# Patient Record
Sex: Male | Born: 1955
Health system: Southern US, Community
[De-identification: ages and names within clinical notes are randomized; demographics above are authoritative.]

## PROBLEM LIST (undated history)

## (undated) DIAGNOSIS — G459 Transient cerebral ischemic attack, unspecified: Secondary | ICD-10-CM

## (undated) DIAGNOSIS — I2510 Atherosclerotic heart disease of native coronary artery without angina pectoris: Secondary | ICD-10-CM

## (undated) DIAGNOSIS — F0390 Unspecified dementia without behavioral disturbance: Secondary | ICD-10-CM

## (undated) DIAGNOSIS — E785 Hyperlipidemia, unspecified: Secondary | ICD-10-CM

## (undated) DIAGNOSIS — C259 Malignant neoplasm of pancreas, unspecified: Secondary | ICD-10-CM

## (undated) DIAGNOSIS — K859 Acute pancreatitis without necrosis or infection, unspecified: Secondary | ICD-10-CM

## (undated) DIAGNOSIS — M51369 Other intervertebral disc degeneration, lumbar region without mention of lumbar back pain or lower extremity pain: Secondary | ICD-10-CM

## (undated) DIAGNOSIS — K219 Gastro-esophageal reflux disease without esophagitis: Secondary | ICD-10-CM

## (undated) DIAGNOSIS — M5136 Other intervertebral disc degeneration, lumbar region: Secondary | ICD-10-CM

## (undated) DIAGNOSIS — N189 Chronic kidney disease, unspecified: Secondary | ICD-10-CM

## (undated) DIAGNOSIS — B029 Zoster without complications: Secondary | ICD-10-CM

## (undated) DIAGNOSIS — M549 Dorsalgia, unspecified: Secondary | ICD-10-CM

## (undated) DIAGNOSIS — I509 Heart failure, unspecified: Secondary | ICD-10-CM

## (undated) DIAGNOSIS — I219 Acute myocardial infarction, unspecified: Secondary | ICD-10-CM

## (undated) DIAGNOSIS — I639 Cerebral infarction, unspecified: Secondary | ICD-10-CM

## (undated) DIAGNOSIS — K469 Unspecified abdominal hernia without obstruction or gangrene: Secondary | ICD-10-CM

## (undated) DIAGNOSIS — E119 Type 2 diabetes mellitus without complications: Secondary | ICD-10-CM

## (undated) DIAGNOSIS — R413 Other amnesia: Secondary | ICD-10-CM

## (undated) DIAGNOSIS — R569 Unspecified convulsions: Secondary | ICD-10-CM

## (undated) DIAGNOSIS — M1990 Unspecified osteoarthritis, unspecified site: Secondary | ICD-10-CM

## (undated) DIAGNOSIS — I10 Essential (primary) hypertension: Secondary | ICD-10-CM

## (undated) HISTORY — DX: Gastro-esophageal reflux disease without esophagitis: K21.9

## (undated) HISTORY — PX: CARDIAC CATHETERIZATION: SHX172

## (undated) HISTORY — PX: VASECTOMY: SHX75

## (undated) HISTORY — DX: Heart failure, unspecified: I50.9

## (undated) HISTORY — PX: TOTAL ELBOW REPLACEMENT: SUR1214

## (undated) HISTORY — DX: Zoster without complications: B02.9

## (undated) HISTORY — DX: Acute pancreatitis without necrosis or infection, unspecified: K85.90

## (undated) HISTORY — PX: CORONARY ANGIOPLASTY: SHX604

## (undated) HISTORY — PX: OTHER SURGICAL HISTORY: SHX169

## (undated) HISTORY — PX: VENTRICULOPERITONEAL SHUNT: SHX204

## (undated) HISTORY — DX: Hyperlipidemia, unspecified: E78.5

## (undated) HISTORY — DX: Atherosclerotic heart disease of native coronary artery without angina pectoris: I25.10

## (undated) HISTORY — DX: Dorsalgia, unspecified: M54.9

## (undated) HISTORY — PX: EXTERNAL FIXATION WRIST FRACTURE: SHX1553

---

## 2003-09-08 ENCOUNTER — Emergency Department (HOSPITAL_COMMUNITY): Admission: EM | Admit: 2003-09-08 | Discharge: 2003-09-08 | Payer: Self-pay | Admitting: Emergency Medicine

## 2004-07-06 ENCOUNTER — Encounter: Payer: Self-pay | Admitting: Neurosurgery

## 2004-07-23 ENCOUNTER — Encounter: Payer: Self-pay | Admitting: Neurosurgery

## 2004-08-23 ENCOUNTER — Encounter: Payer: Self-pay | Admitting: Neurosurgery

## 2004-09-23 ENCOUNTER — Encounter: Payer: Self-pay | Admitting: Neurosurgery

## 2004-10-21 ENCOUNTER — Encounter: Payer: Self-pay | Admitting: Neurosurgery

## 2004-11-21 ENCOUNTER — Encounter: Payer: Self-pay | Admitting: Neurosurgery

## 2006-01-04 ENCOUNTER — Emergency Department: Payer: Self-pay | Admitting: Emergency Medicine

## 2007-04-20 ENCOUNTER — Emergency Department: Payer: Self-pay | Admitting: Emergency Medicine

## 2008-02-12 ENCOUNTER — Ambulatory Visit: Payer: Self-pay | Admitting: Chiropractor

## 2008-07-14 ENCOUNTER — Emergency Department: Payer: Self-pay | Admitting: Emergency Medicine

## 2008-08-20 ENCOUNTER — Ambulatory Visit: Payer: Self-pay | Admitting: Specialist

## 2010-12-06 ENCOUNTER — Inpatient Hospital Stay: Payer: Self-pay | Admitting: Internal Medicine

## 2010-12-15 ENCOUNTER — Ambulatory Visit: Payer: Self-pay | Admitting: Pain Medicine

## 2010-12-19 ENCOUNTER — Observation Stay: Payer: Self-pay | Admitting: Internal Medicine

## 2011-01-05 ENCOUNTER — Encounter: Payer: Self-pay | Admitting: Cardiovascular Disease

## 2011-01-18 ENCOUNTER — Ambulatory Visit: Payer: Self-pay | Admitting: Internal Medicine

## 2011-01-22 ENCOUNTER — Encounter: Payer: Self-pay | Admitting: Cardiovascular Disease

## 2011-02-21 ENCOUNTER — Encounter: Payer: Self-pay | Admitting: Cardiovascular Disease

## 2011-04-18 ENCOUNTER — Emergency Department: Payer: Self-pay | Admitting: Emergency Medicine

## 2011-07-31 ENCOUNTER — Inpatient Hospital Stay: Payer: Self-pay | Admitting: Internal Medicine

## 2011-08-24 HISTORY — PX: OTHER SURGICAL HISTORY: SHX169

## 2011-09-01 ENCOUNTER — Encounter: Payer: Self-pay | Admitting: Cardiology

## 2011-09-06 ENCOUNTER — Observation Stay: Payer: Self-pay | Admitting: Internal Medicine

## 2011-09-06 LAB — URINALYSIS, COMPLETE
Bacteria: NONE SEEN
Bilirubin,UR: NEGATIVE
Blood: NEGATIVE
Glucose,UR: NEGATIVE mg/dL (ref 0–75)
Ketone: NEGATIVE
Leukocyte Esterase: NEGATIVE
Nitrite: NEGATIVE
Ph: 5 (ref 4.5–8.0)
Protein: NEGATIVE
RBC,UR: <1 /HPF (ref 0–5)
Specific Gravity: 1.016 (ref 1.003–1.030)
Squamous Epithelial: NONE SEEN
WBC UR: <1 /HPF (ref 0–5)

## 2011-09-06 LAB — CBC
HCT: 46 % (ref 40.0–52.0)
HGB: 15.3 g/dL (ref 13.0–18.0)
MCH: 31.1 pg (ref 26.0–34.0)
MCHC: 33.3 g/dL (ref 32.0–36.0)
MCV: 93 fL (ref 80–100)
Platelet: 225 10*3/uL (ref 150–440)
RBC: 4.93 10*6/uL (ref 4.40–5.90)
RDW: 13.4 % (ref 11.5–14.5)
WBC: 9.4 10*3/uL (ref 3.8–10.6)

## 2011-09-06 LAB — COMPREHENSIVE METABOLIC PANEL
Albumin: 4.1 g/dL (ref 3.4–5.0)
Alkaline Phosphatase: 143 U/L — ABNORMAL HIGH (ref 50–136)
Anion Gap: 10 (ref 7–16)
BUN: 14 mg/dL (ref 7–18)
Bilirubin,Total: 0.4 mg/dL (ref 0.2–1.0)
Calcium, Total: 9.3 mg/dL (ref 8.5–10.1)
Chloride: 104 mmol/L (ref 98–107)
Co2: 25 mmol/L (ref 21–32)
Creatinine: 0.86 mg/dL (ref 0.60–1.30)
EGFR (African American): >60
EGFR (Non-African Amer.): >60
Glucose: 128 mg/dL — ABNORMAL HIGH (ref 65–99)
Osmolality: 280 (ref 275–301)
Potassium: 3.4 mmol/L — ABNORMAL LOW (ref 3.5–5.1)
SGOT(AST): 27 U/L (ref 15–37)
SGPT (ALT): 42 U/L
Sodium: 139 mmol/L (ref 136–145)
Total Protein: 7.9 g/dL (ref 6.4–8.2)

## 2011-09-06 LAB — LIPASE, BLOOD: Lipase: 112 U/L (ref 73–393)

## 2011-09-06 LAB — TROPONIN I
Troponin-I: <0.02 ng/mL
Troponin-I: <0.02 ng/mL

## 2011-09-06 LAB — AMYLASE: Amylase: 30 U/L (ref 25–115)

## 2011-09-07 LAB — CBC WITH DIFFERENTIAL/PLATELET
Basophil #: 0 10*3/uL (ref 0.0–0.1)
Basophil %: 0.3 %
Eosinophil #: 0.1 10*3/uL (ref 0.0–0.7)
Eosinophil %: 0.7 %
HCT: 43 % (ref 40.0–52.0)
HGB: 14.3 g/dL (ref 13.0–18.0)
Lymphocyte #: 2.8 10*3/uL (ref 1.0–3.6)
Lymphocyte %: 31.4 %
MCH: 31 pg (ref 26.0–34.0)
MCHC: 33.1 g/dL (ref 32.0–36.0)
MCV: 94 fL (ref 80–100)
Monocyte #: 0.9 10*3/uL — ABNORMAL HIGH (ref 0.0–0.7)
Monocyte %: 10 %
Neutrophil #: 5.1 10*3/uL (ref 1.4–6.5)
Neutrophil %: 57.6 %
Platelet: 213 10*3/uL (ref 150–440)
RBC: 4.59 10*6/uL (ref 4.40–5.90)
RDW: 13.7 % (ref 11.5–14.5)
WBC: 8.8 10*3/uL (ref 3.8–10.6)

## 2011-09-07 LAB — BASIC METABOLIC PANEL
Anion Gap: 10 (ref 7–16)
BUN: 9 mg/dL (ref 7–18)
Calcium, Total: 8.6 mg/dL (ref 8.5–10.1)
Chloride: 105 mmol/L (ref 98–107)
Co2: 26 mmol/L (ref 21–32)
Creatinine: 0.67 mg/dL (ref 0.60–1.30)
EGFR (African American): >60
EGFR (Non-African Amer.): >60
Glucose: 162 mg/dL — ABNORMAL HIGH (ref 65–99)
Osmolality: 283 (ref 275–301)
Potassium: 4 mmol/L (ref 3.5–5.1)
Sodium: 141 mmol/L (ref 136–145)

## 2011-09-07 LAB — TROPONIN I
Troponin-I: <0.02 ng/mL
Troponin-I: <0.02 ng/mL

## 2011-09-24 ENCOUNTER — Encounter: Payer: Self-pay | Admitting: Cardiology

## 2011-10-22 ENCOUNTER — Encounter: Payer: Self-pay | Admitting: Cardiology

## 2011-11-22 ENCOUNTER — Encounter: Payer: Self-pay | Admitting: Cardiology

## 2011-12-11 ENCOUNTER — Ambulatory Visit: Payer: Self-pay | Admitting: Internal Medicine

## 2011-12-11 LAB — CBC WITH DIFFERENTIAL/PLATELET
Basophil #: 0 10*3/uL (ref 0.0–0.1)
Basophil %: 0.5 %
Eosinophil #: 0.1 10*3/uL (ref 0.0–0.7)
Eosinophil %: 1.2 %
HCT: 44.5 % (ref 40.0–52.0)
HGB: 15.1 g/dL (ref 13.0–18.0)
Lymphocyte #: 2.6 10*3/uL (ref 1.0–3.6)
Lymphocyte %: 36.7 %
MCH: 31.7 pg (ref 26.0–34.0)
MCHC: 33.9 g/dL (ref 32.0–36.0)
MCV: 94 fL (ref 80–100)
Monocyte #: 0.6 x10 3/mm (ref 0.2–1.0)
Monocyte %: 8.9 %
Neutrophil #: 3.7 10*3/uL (ref 1.4–6.5)
Neutrophil %: 52.7 %
Platelet: 211 10*3/uL (ref 150–440)
RBC: 4.75 10*6/uL (ref 4.40–5.90)
RDW: 13.5 % (ref 11.5–14.5)
WBC: 7.1 10*3/uL (ref 3.8–10.6)

## 2011-12-11 LAB — COMPREHENSIVE METABOLIC PANEL
Albumin: 3.5 g/dL (ref 3.4–5.0)
Alkaline Phosphatase: 225 U/L — ABNORMAL HIGH (ref 50–136)
Anion Gap: 10 (ref 7–16)
BUN: 9 mg/dL (ref 7–18)
Bilirubin,Total: 0.2 mg/dL (ref 0.2–1.0)
Calcium, Total: 8.8 mg/dL (ref 8.5–10.1)
Chloride: 100 mmol/L (ref 98–107)
Co2: 23 mmol/L (ref 21–32)
Creatinine: 0.9 mg/dL (ref 0.60–1.30)
EGFR (African American): >60
EGFR (Non-African Amer.): >60
Glucose: 328 mg/dL — ABNORMAL HIGH (ref 65–99)
Osmolality: 278 (ref 275–301)
Potassium: 3.7 mmol/L (ref 3.5–5.1)
SGOT(AST): 29 U/L (ref 15–37)
SGPT (ALT): 49 U/L
Sodium: 133 mmol/L — ABNORMAL LOW (ref 136–145)
Total Protein: 7.8 g/dL (ref 6.4–8.2)

## 2011-12-11 LAB — SEDIMENTATION RATE: Erythrocyte Sed Rate: 18 mm/hr (ref 0–20)

## 2012-01-06 ENCOUNTER — Emergency Department: Payer: Self-pay | Admitting: Unknown Physician Specialty

## 2012-01-06 LAB — MAGNESIUM: Magnesium: 1.6 mg/dL — ABNORMAL LOW

## 2012-01-06 LAB — COMPREHENSIVE METABOLIC PANEL
Albumin: 3.7 g/dL (ref 3.4–5.0)
Alkaline Phosphatase: 179 U/L — ABNORMAL HIGH (ref 50–136)
Anion Gap: 10 (ref 7–16)
BUN: 13 mg/dL (ref 7–18)
Bilirubin,Total: 0.2 mg/dL (ref 0.2–1.0)
Calcium, Total: 8.4 mg/dL — ABNORMAL LOW (ref 8.5–10.1)
Chloride: 103 mmol/L (ref 98–107)
Co2: 23 mmol/L (ref 21–32)
Creatinine: 1.09 mg/dL (ref 0.60–1.30)
EGFR (African American): >60
EGFR (Non-African Amer.): >60
Glucose: 343 mg/dL — ABNORMAL HIGH (ref 65–99)
Osmolality: 286 (ref 275–301)
Potassium: 4.1 mmol/L (ref 3.5–5.1)
SGOT(AST): 36 U/L (ref 15–37)
SGPT (ALT): 48 U/L
Sodium: 136 mmol/L (ref 136–145)
Total Protein: 7.2 g/dL (ref 6.4–8.2)

## 2012-01-06 LAB — APTT: Activated PTT: 24.7 secs (ref 23.6–35.9)

## 2012-01-06 LAB — CBC
HCT: 46 % (ref 40.0–52.0)
HGB: 15.1 g/dL (ref 13.0–18.0)
MCH: 30.7 pg (ref 26.0–34.0)
MCHC: 32.8 g/dL (ref 32.0–36.0)
MCV: 94 fL (ref 80–100)
Platelet: 221 10*3/uL (ref 150–440)
RBC: 4.9 10*6/uL (ref 4.40–5.90)
RDW: 13.6 % (ref 11.5–14.5)
WBC: 7.7 10*3/uL (ref 3.8–10.6)

## 2012-01-06 LAB — CK TOTAL AND CKMB (NOT AT ARMC)
CK, Total: 397 U/L — ABNORMAL HIGH (ref 35–232)
CK-MB: 5 ng/mL — ABNORMAL HIGH (ref 0.5–3.6)

## 2012-01-06 LAB — TROPONIN I
Troponin-I: 0.11 ng/mL — ABNORMAL HIGH
Troponin-I: 1.6 ng/mL — ABNORMAL HIGH

## 2012-01-06 LAB — PROTIME-INR
INR: 0.9
Prothrombin Time: 13 secs (ref 11.5–14.7)

## 2014-09-17 LAB — CBC
HCT: 53.1 % — AB (ref 40.0–52.0)
HGB: 18 g/dL (ref 13.0–18.0)
MCH: 31.7 pg (ref 26.0–34.0)
MCHC: 33.9 g/dL (ref 32.0–36.0)
MCV: 94 fL (ref 80–100)
Platelet: 224 10*3/uL (ref 150–440)
RBC: 5.68 10*6/uL (ref 4.40–5.90)
RDW: 14 % (ref 11.5–14.5)
WBC: 18.4 10*3/uL — ABNORMAL HIGH (ref 3.8–10.6)

## 2014-09-17 LAB — HEPATIC FUNCTION PANEL A (ARMC)
ALBUMIN: 4 g/dL (ref 3.4–5.0)
Alkaline Phosphatase: 228 U/L — ABNORMAL HIGH (ref 46–116)
BILIRUBIN TOTAL: 0.7 mg/dL (ref 0.2–1.0)
Bilirubin, Direct: <0.1 mg/dL (ref 0.0–0.2)
SGOT(AST): 59 U/L — ABNORMAL HIGH (ref 15–37)
SGPT (ALT): 111 U/L — ABNORMAL HIGH (ref 14–63)
Total Protein: 8.6 g/dL — ABNORMAL HIGH (ref 6.4–8.2)

## 2014-09-17 LAB — LIPASE, BLOOD: Lipase: 21 U/L — ABNORMAL LOW (ref 73–393)

## 2014-09-17 LAB — BASIC METABOLIC PANEL
Anion Gap: 10 (ref 7–16)
BUN: 14 mg/dL (ref 7–18)
Calcium, Total: 8.9 mg/dL (ref 8.5–10.1)
Chloride: 105 mmol/L (ref 98–107)
Co2: 22 mmol/L (ref 21–32)
Creatinine: 1.06 mg/dL (ref 0.60–1.30)
EGFR (African American): >60
EGFR (Non-African Amer.): >60
Glucose: 166 mg/dL — ABNORMAL HIGH (ref 65–99)
OSMOLALITY: 278 (ref 275–301)
Potassium: 4.3 mmol/L (ref 3.5–5.1)
Sodium: 137 mmol/L (ref 136–145)

## 2014-09-17 LAB — TROPONIN I: Troponin-I: <0.02 ng/mL

## 2014-09-18 ENCOUNTER — Observation Stay: Payer: Self-pay | Admitting: Internal Medicine

## 2014-09-18 LAB — TROPONIN I
Troponin-I: <0.02 ng/mL
Troponin-I: <0.02 ng/mL

## 2014-09-18 LAB — ACETAMINOPHEN LEVEL: Acetaminophen: <2 ug/mL

## 2014-09-18 LAB — HEMOGLOBIN A1C: Hemoglobin A1C: 9.8 % — ABNORMAL HIGH (ref 4.2–6.3)

## 2014-09-18 LAB — AMMONIA: Ammonia, Plasma: 24 mcmol/L (ref 11–32)

## 2014-09-18 LAB — CLOSTRIDIUM DIFFICILE(ARMC)

## 2014-09-19 LAB — LIPID PANEL
Cholesterol: 73 mg/dL (ref 0–200)
HDL Cholesterol: 23 mg/dL — ABNORMAL LOW (ref 40–60)
Ldl Cholesterol, Calc: 30 mg/dL (ref 0–100)
Triglycerides: 98 mg/dL (ref 0–200)
VLDL Cholesterol, Calc: 20 mg/dL (ref 5–40)

## 2014-09-19 LAB — TSH: Thyroid Stimulating Horm: 1.7 u[IU]/mL

## 2014-09-21 LAB — COMPREHENSIVE METABOLIC PANEL
Albumin: 3.5 g/dL (ref 3.4–5.0)
Alkaline Phosphatase: 176 U/L — ABNORMAL HIGH (ref 46–116)
Anion Gap: 7 (ref 7–16)
BUN: 11 mg/dL (ref 7–18)
Bilirubin,Total: 0.6 mg/dL (ref 0.2–1.0)
CREATININE: 0.84 mg/dL (ref 0.60–1.30)
Calcium, Total: 8.7 mg/dL (ref 8.5–10.1)
Chloride: 101 mmol/L (ref 98–107)
Co2: 28 mmol/L (ref 21–32)
EGFR (African American): >60
Glucose: 265 mg/dL — ABNORMAL HIGH (ref 65–99)
Osmolality: 281 (ref 275–301)
Potassium: 3.5 mmol/L (ref 3.5–5.1)
SGOT(AST): 38 U/L — ABNORMAL HIGH (ref 15–37)
SGPT (ALT): 59 U/L (ref 14–63)
Sodium: 136 mmol/L (ref 136–145)
Total Protein: 7.6 g/dL (ref 6.4–8.2)

## 2014-09-21 LAB — CBC WITH DIFFERENTIAL/PLATELET
Basophil #: 0.1 10*3/uL (ref 0.0–0.1)
Basophil #: 0 10*3/uL (ref 0.0–0.1)
Basophil %: 0.7 %
Basophil %: 0.3 %
EOS ABS: 0.1 10*3/uL (ref 0.0–0.7)
Eosinophil #: 0.1 10*3/uL (ref 0.0–0.7)
Eosinophil %: 1 %
Eosinophil %: 0.9 %
HCT: 47.1 % (ref 40.0–52.0)
HCT: 45.8 % (ref 40.0–52.0)
HGB: 15.7 g/dL (ref 13.0–18.0)
HGB: 15.4 g/dL (ref 13.0–18.0)
Lymphocyte #: 2.6 10*3/uL (ref 1.0–3.6)
Lymphocyte #: 2.1 10*3/uL (ref 1.0–3.6)
Lymphocyte %: 36.6 %
Lymphocyte %: 28.9 %
MCH: 31.3 pg (ref 26.0–34.0)
MCH: 31.3 pg (ref 26.0–34.0)
MCHC: 33.4 g/dL (ref 32.0–36.0)
MCHC: 33.6 g/dL (ref 32.0–36.0)
MCV: 94 fL (ref 80–100)
MCV: 93 fL (ref 80–100)
Monocyte #: 0.7 x10 3/mm (ref 0.2–1.0)
Monocyte #: 0.7 x10 3/mm (ref 0.2–1.0)
Monocyte %: 9.9 %
Monocyte %: 10 %
Neutrophil #: 3.7 10*3/uL (ref 1.4–6.5)
Neutrophil #: 4.4 10*3/uL (ref 1.4–6.5)
Neutrophil %: 51.8 %
Neutrophil %: 59.9 %
PLATELETS: 196 10*3/uL (ref 150–440)
Platelet: 186 10*3/uL (ref 150–440)
RBC: 5.02 10*6/uL (ref 4.40–5.90)
RBC: 4.91 10*6/uL (ref 4.40–5.90)
RDW: 14 % (ref 11.5–14.5)
RDW: 13.8 % (ref 11.5–14.5)
WBC: 7.1 10*3/uL (ref 3.8–10.6)
WBC: 7.3 10*3/uL (ref 3.8–10.6)

## 2014-09-21 LAB — AMMONIA: Ammonia, Plasma: 36 mcmol/L — ABNORMAL HIGH (ref 11–32)

## 2014-09-22 LAB — PLATELET COUNT: Platelet: 188 10*3/uL (ref 150–440)

## 2014-09-23 LAB — CBC WITH DIFFERENTIAL/PLATELET
BASOS PCT: 0.3 %
Basophil #: 0 10*3/uL (ref 0.0–0.1)
EOS PCT: 1.4 %
Eosinophil #: 0.1 10*3/uL (ref 0.0–0.7)
HCT: 43.4 % (ref 40.0–52.0)
HGB: 14.8 g/dL (ref 13.0–18.0)
LYMPHS ABS: 3.2 10*3/uL (ref 1.0–3.6)
Lymphocyte %: 45.9 %
MCH: 31.8 pg (ref 26.0–34.0)
MCHC: 34.1 g/dL (ref 32.0–36.0)
MCV: 93 fL (ref 80–100)
Monocyte #: 0.7 x10 3/mm (ref 0.2–1.0)
Monocyte %: 9.3 %
Neutrophil #: 3.1 10*3/uL (ref 1.4–6.5)
Neutrophil %: 43.1 %
Platelet: 176 10*3/uL (ref 150–440)
RBC: 4.66 10*6/uL (ref 4.40–5.90)
RDW: 13.8 % (ref 11.5–14.5)
WBC: 7.1 10*3/uL (ref 3.8–10.6)

## 2014-09-23 LAB — BASIC METABOLIC PANEL
Anion Gap: 8 (ref 7–16)
BUN: 12 mg/dL (ref 7–18)
CALCIUM: 8.6 mg/dL (ref 8.5–10.1)
CHLORIDE: 103 mmol/L (ref 98–107)
Co2: 27 mmol/L (ref 21–32)
Creatinine: 0.67 mg/dL (ref 0.60–1.30)
EGFR (Non-African Amer.): >60
Glucose: 166 mg/dL — ABNORMAL HIGH (ref 65–99)
Osmolality: 279 (ref 275–301)
Potassium: 3.4 mmol/L — ABNORMAL LOW (ref 3.5–5.1)
Sodium: 138 mmol/L (ref 136–145)

## 2014-09-24 LAB — BASIC METABOLIC PANEL
ANION GAP: 6 — AB (ref 7–16)
BUN: 10 mg/dL (ref 7–18)
Calcium, Total: 9 mg/dL (ref 8.5–10.1)
Chloride: 103 mmol/L (ref 98–107)
Co2: 28 mmol/L (ref 21–32)
Creatinine: 0.73 mg/dL (ref 0.60–1.30)
EGFR (African American): >60
EGFR (Non-African Amer.): >60
Glucose: 94 mg/dL (ref 65–99)
Osmolality: 273 (ref 275–301)
Potassium: 3.3 mmol/L — ABNORMAL LOW (ref 3.5–5.1)
Sodium: 137 mmol/L (ref 136–145)

## 2014-09-24 LAB — CBC WITH DIFFERENTIAL/PLATELET
Basophil #: 0 10*3/uL (ref 0.0–0.1)
Basophil %: 0.4 %
Eosinophil #: 0.1 10*3/uL (ref 0.0–0.7)
Eosinophil %: 1.4 %
HCT: 45.7 % (ref 40.0–52.0)
HGB: 15.3 g/dL (ref 13.0–18.0)
Lymphocyte #: 3.3 10*3/uL (ref 1.0–3.6)
Lymphocyte %: 41 %
MCH: 31.8 pg (ref 26.0–34.0)
MCHC: 33.5 g/dL (ref 32.0–36.0)
MCV: 95 fL (ref 80–100)
Monocyte #: 0.7 x10 3/mm (ref 0.2–1.0)
Monocyte %: 9 %
Neutrophil #: 3.9 10*3/uL (ref 1.4–6.5)
Neutrophil %: 48.2 %
Platelet: 187 10*3/uL (ref 150–440)
RBC: 4.82 10*6/uL (ref 4.40–5.90)
RDW: 13.7 % (ref 11.5–14.5)
WBC: 8.1 10*3/uL (ref 3.8–10.6)

## 2014-10-17 ENCOUNTER — Ambulatory Visit: Payer: Self-pay | Admitting: Gastroenterology

## 2014-10-22 ENCOUNTER — Ambulatory Visit
Admit: 2014-10-22 | Disposition: A | Discharge status: Home / Self Care | Payer: Self-pay | Attending: Oncology | Admitting: Oncology

## 2014-11-15 ENCOUNTER — Ambulatory Visit: Payer: Self-pay | Admitting: Gastroenterology

## 2014-11-18 ENCOUNTER — Emergency Department: Payer: Self-pay | Admitting: Emergency Medicine

## 2014-11-18 LAB — COMPREHENSIVE METABOLIC PANEL
ANION GAP: 9 (ref 7–16)
Albumin: 4.4 g/dL
Alkaline Phosphatase: 199 U/L — ABNORMAL HIGH
BUN: 11 mg/dL
Bilirubin,Total: 0.5 mg/dL
Calcium, Total: 9.1 mg/dL
Chloride: 106 mmol/L
Co2: 24 mmol/L
Creatinine: 0.65 mg/dL
EGFR (Non-African Amer.): >60
GLUCOSE: 99 mg/dL
Potassium: 3.5 mmol/L
SGOT(AST): 49 U/L — ABNORMAL HIGH
SGPT (ALT): 57 U/L
SODIUM: 139 mmol/L
TOTAL PROTEIN: 8.7 g/dL — AB

## 2014-11-18 LAB — AMMONIA: Ammonia, Plasma: 20 umol/L

## 2014-11-18 LAB — CBC
HCT: 49.8 % (ref 40.0–52.0)
HGB: 16.7 g/dL (ref 13.0–18.0)
MCH: 31.4 pg (ref 26.0–34.0)
MCHC: 33.5 g/dL (ref 32.0–36.0)
MCV: 94 fL (ref 80–100)
Platelet: 207 10*3/uL (ref 150–440)
RBC: 5.31 10*6/uL (ref 4.40–5.90)
RDW: 13.2 % (ref 11.5–14.5)
WBC: 7.1 10*3/uL (ref 3.8–10.6)

## 2014-11-18 LAB — TROPONIN I: Troponin-I: <0.03 ng/mL

## 2014-11-18 LAB — ETHANOL: Ethanol: <5 mg/dL

## 2014-11-22 ENCOUNTER — Ambulatory Visit
Admit: 2014-11-22 | Disposition: A | Discharge status: Home / Self Care | Payer: Self-pay | Attending: Oncology | Admitting: Oncology

## 2014-12-14 LAB — COMPREHENSIVE METABOLIC PANEL
ALBUMIN: 4.1 g/dL
ALK PHOS: 269 U/L — AB
ANION GAP: 9 (ref 7–16)
BILIRUBIN TOTAL: 0.6 mg/dL
BUN: 12 mg/dL
CALCIUM: 8.6 mg/dL — AB
CO2: 24 mmol/L
CREATININE: 0.85 mg/dL
Chloride: 103 mmol/L
EGFR (Non-African Amer.): >60
GLUCOSE: 381 mg/dL — AB
Potassium: 3 mmol/L — ABNORMAL LOW
SGOT(AST): 47 U/L — ABNORMAL HIGH
SGPT (ALT): 60 U/L
Sodium: 136 mmol/L
Total Protein: 7.8 g/dL

## 2014-12-14 LAB — CBC
HCT: 45.2 % (ref 40.0–52.0)
HGB: 15.5 g/dL (ref 13.0–18.0)
MCH: 31.9 pg (ref 26.0–34.0)
MCHC: 34.3 g/dL (ref 32.0–36.0)
MCV: 93 fL (ref 80–100)
Platelet: 198 10*3/uL (ref 150–440)
RBC: 4.85 10*6/uL (ref 4.40–5.90)
RDW: 13.5 % (ref 11.5–14.5)
WBC: 9.6 10*3/uL (ref 3.8–10.6)

## 2014-12-14 LAB — URINALYSIS, COMPLETE
BILIRUBIN, UR: NEGATIVE
Bacteria: NONE SEEN
Ketone: NEGATIVE
Leukocyte Esterase: NEGATIVE
Nitrite: NEGATIVE
Ph: 6 (ref 4.5–8.0)
Protein: NEGATIVE
SPECIFIC GRAVITY: 1.029 (ref 1.003–1.030)
Squamous Epithelial: NONE SEEN

## 2014-12-14 LAB — TROPONIN I: Troponin-I: <0.03 ng/mL

## 2014-12-15 LAB — TSH: Thyroid Stimulating Horm: 0.704 u[IU]/mL

## 2014-12-15 NOTE — Discharge Summary (Signed)
PATIENT NAME:  Maurice Patterson, BELGER MR#:  [id number] DATE OF BIRTH:  17-Jun-1956  DATE OF ADMISSION:  07/31/2011 DATE OF DISCHARGE:  08/02/2011  DISPOSITION: The patient is being transferred to [facility], accepted by Dr. Alycia Rossetti, for diffuse in-stent restenosis of the drug-eluting stent in the RCA.   DIAGNOSES: 1. Subendocardial myocardial infarction status post cardiac catheterization suggestive of 60 to 70% lesion in the mid LAD and diffuse in-stent restenosis of a drug-eluting stent in the distal RCA. Coronary artery disease.  2. Hypertension.  3. Diabetes.  4. Hyperlipidemia. 5. Chronic back pain.   CONSULTATIONS:  1. Cardiology, Dr. Lavera Guise.  2. Interventional cardiology, Dr. Lujean Amel.  PROCEDURES: Cardiac catheterization.   HOSPITAL COURSE: This is a 59 year old male who has a history of coronary artery disease, diabetes, hypertension, and hyperlipidemia, not smoking anymore. He presented with chest pain with typical features. The chest pain was relieved with nitroglycerin. When it recurred he presented to the Emergency Room. He was admitted as chest pain with typical features and angina. He ruled in for myocardial infarction. Initially when he came in his CK was 405, MB 4.6, and troponin of 0.05. His troponins increased to 0.13 to 0.12.  He is on aspirin. He is on statin, Coreg, and losartan, and he is on therapeutic doses of Lovenox, also on Plavix. He was also placed on Nitro-Dur patch. Initially Dr. Lavera Guise saw him and suggested a cardiac catheterization so Dr. Clayborn Bigness was consulted. The patient had no further chest pains during the hospital stay. When he came in his creatinine was normal at 1.1. His alkaline phosphatase was only slightly elevated. His lipid profile showed he had an LDL of 52 with triglycerides of 295. Today his creatinine was 0.87 before cardiac catheterization. His hemoglobin was stable , white count 9.3, platelet count normal at 204,000. Cardiac  catheterization done today by Dr. Clayborn Bigness showed diffuse in-stent restenosis of the drug-eluting stent of the distal RCA, and  60 to 70% lesion of the mid LAD. He reviewed the case with [facility] and the patient is being transferred to [facility] for possible CABG versus PCI of the stent. The patient is accepted by Dr. Alycia Rossetti.   CURRENT MEDICATIONS:  1. Aspirin 81 mg daily.  2. Carvedilol 25 mg b.i.d.  3. Plavix 75 mg daily.  4. Lantus 35 units b.i.d.  5. Humalog 10 units b.i.d. before meals.  6. Sliding scale with NovoLog. 7. Losartan 100 mg daily.  8. Niacin 500 mg twice a day.  9. Nitroglycerin patch 0.3 mg topical daily.  10. Protonix 40 mg daily.  11. Pravastatin 20 mg at bedtime.   CURRENT CONDITION:  T-max of 98.7, heart rate 78, blood pressure 152/89, saturating 96% on room air. Chest is clear. Chest pain free.    TIME SPENT ON DISCHARGE: 45 minutes.  ____________________________ Mena Pauls, MD ag:bjt D: 08/02/2011 16:48:54 ET T: 08/02/2011 17:03:54 ET JOB#: [id number]  cc: Mena Pauls, MD, <Dictator> Floria Raveling. Astrid Divine, MD Dionisio David, MD Cletis Athens, MD Mena Pauls MD ELECTRONICALLY SIGNED 09/02/2011 15:24

## 2014-12-15 NOTE — Consult Note (Signed)
PATIENT NAME:  Maurice Patterson, Maurice Patterson MR#:  [id number] DATE OF BIRTH:  27-May-1956  DATE OF CONSULTATION:  09/07/2011  REFERRING PHYSICIAN:   Mena Pauls, MD of [facility] CONSULTING PHYSICIAN:  Jeannette How. Marina Gravel, MD  REASON FOR CONSULTATION: Abdominal pain.   HISTORY: This 59 year old white male with a history of significant coronary artery disease, status post two recent coronary artery stentings, presents with a history of diabetes, hypertension, hyperlipidemia, chronic back pain, and a nine-month history of chronic abdominal pain which became worse over the course of the last 24 hours prior to his admission.  The patient had a normal white count, normal lipase. He had a CT scan of the abdomen and pelvis done demonstrating no specific findings to account for his abdominal pain. Mild pancreatic calcifications were noted. The patient was admitted to the Hospitalist Service. He did require a significant amount of intravenous narcotics in the Emergency Room. He has ruled out for myocardial infarction. EKG has been unchanged. The Surgical Service been asked to comment regarding the etiology of his abdominal pain.   ALLERGIES: None.   MEDICATIONS: Amlodipine, aspirin, Plavix, gabapentin, Humalog, Lantus, losartan, metoprolol, Niaspan, nitroglycerin, simvastatin and tramadol.   PAST MEDICAL HISTORY: Significant for: 1. Coronary artery disease. 2. Diabetes.  3. Hypertension.  4. Obesity.  5. Hyperlipidemia.  6. Chronic back pain.  7. Constipation.   PAST SURGICAL HISTORY: Coronary artery stenting. No abdominal operations.   SOCIAL HISTORY: He lives with his wife. He quit smoking several months ago. No drug or alcohol use.  FAMILY HISTORY: Family history is significant for coronary artery disease and diabetes mellitus.   REVIEW OF SYSTEMS: Review of systems is as described in the history of the present illness and otherwise unremarkable.   PHYSICAL EXAMINATION:  GENERAL: He is an alert  and oriented white male in no obvious distress, in his own pajamas.    VITAL SIGNS: Temperature is 98.0, pulse 86, respirations 18, blood pressure is 130/82.   LUNGS: Clear.   HEART: Regular rate and rhythm.   ABDOMEN: Obese, soft and nontender. There is an area of diastases recti. No obvious hernias, masses, peritoneal signs. His abdomen is soft and nontender.   EXTREMITIES: Warm and well perfused.   NEUROLOGIC/PSYCHIATRIC: Examination is unremarkable.   LABORATORY, DIAGNOSTIC AND RADIOLOGICAL DATA:  Glucose 162, BUN 9, creatinine 0.67, sodium 141, potassium 4.0, chloride 105, CO2 26.  White count 8.8, hemoglobin 14.1, hematocrit 42.0, platelet count 213,000, normal differential. Urinalysis is negative.  Lactic acid was 1.8 when he came in.  Troponins have all been normal.  Review of CT scan is as described in the history of present illness; specifically, the report reads no abdominal aortic aneurysm or dissection, mild stranding of the right common femoral vein. Pancreatic calcifications are focal. They are noted in the tail. There is mild stranding in the subcutaneous fat in the anterior abdomen, nonspecific findings. There is an extensive amount of stool seen throughout the colon.   IMPRESSION:  1. Abdominal pain, nonsurgical etiology.  2. Diastases recti. No indication for repair.  3. Obesity.  4. Significant constipation potentially as a contributing factor to his chronic abdominal pain.  5. Diabetes.   RECOMMENDATIONS: The patient needs laxative. He needs a regular bowel regimen. No surgical followup or plans are in the works. The case was discussed with Dr. Ether Griffins.  ____________________________ Jeannette How. Marina Gravel, MD mab:cbb D: 09/07/2011 15:56:13 ET T: 09/07/2011 18:43:49 ET JOB#: [id number]  cc: Elta Guadeloupe A. Marina Gravel, MD, <Dictator> Floria Raveling.  Astrid Divine, MD Hortencia Conradi MD ELECTRONICALLY SIGNED 09/10/2011 11:15

## 2014-12-15 NOTE — Consult Note (Signed)
Brief Consult Note: Diagnosis: diastasis rectii, abdominal pain non-surgical etiology, constipation, CAD.   Patient was seen by consultant.   Consult note dictated.   Discussed with Attending MD.   Comments: needs aggressive bowel regiment, no indication to repair diastasis rectii, no f/u needed..  Electronic Signatures: Sherri Rad (MD)  (Signed 15-Jan-13 15:51)  Authored: Brief Consult Note   Last Updated: 15-Jan-13 15:51 by Sherri Rad (MD)

## 2014-12-15 NOTE — Discharge Summary (Signed)
PATIENT NAME:  Maurice Patterson, Maurice Patterson MR#:  [id number] DATE OF BIRTH:  [birth year]  DATE OF ADMISSION:  09/06/2011 DATE OF DISCHARGE:  09/07/2011  ADMITTING DIAGNOSIS:  Abdominal pain.  DISCHARGE DIAGNOSES:  1. Abdominal pain, lower area, unclear etiology, likely constipation. 2. Right common femoral vein stranding concerning for prior hemorrhage after cardiac catheterization on CT scan.  3. History of coronary artery disease.  4. Hypertension.  5. Hyperlipidemia. 6. Chronic back pain.   DISCHARGE CONDITION: Stable.   DISCHARGE MEDICATIONS: The patient is to resume his outpatient medications which are:  1. Acetaminophen oxycodone 325 mg/7.5 mg 1 tablet every six hours as needed.  2. Clopidogrel 75 mg p.o. daily.  3. Amlodipine 5 mg p.o. daily.  4. Losartan 100 mg p.o. daily. 5. Simvastatin 20 mg p.o. at bedtime. 6. Aspirin 81 mg p.o. daily.  7. Metoprolol succinate ER 25 mg p.o. daily.  8. Niaspan ER 500 mg p.o. 2 tablets once daily at bedtime.  9. Gabapentin 100 mg 3 times daily.  10. Nitrostat 0.4 mg sublingually every five minutes as needed.  11. Humalog 15 units subq three times daily with meals.  12. Lantus 50 units subcutaneously twice daily.  13. Tramadol 50 mg p.o. every six hours as needed.  14. Sliding scale insulin.   ADDITIONAL MEDICATIONS:  1. Colace 100 mg p.o. twice daily.  2. Senna 1 tablet p.o. twice daily.  3. MiraLAX 17 grams p.o. daily as needed.   DISCHARGE DIET: 1800 ADA, two-gram salt, low fat, low cholesterol.   PHYSICAL ACTIVITY LIMITATIONS: As tolerated.   FOLLOWUP: 1. Follow-up appointment with Dr. Lucky Cowboy two days after discharge.  2. Follow-up with Dr. Astrid Divine two days after discharge.    CONSULTANTS: 1. Dr. Sherri Rad. 2. Care management.   RADIOLOGIC STUDIES: 1. Abdomen three-way including PA of chest 09/06/2011:  Unremarkable abdominal series. 2. CT of the abdomen and pelvis with contrast 09/06/2011: No abdominal aortic aneurysm or dissection.  There is mild stranding and a small cylindrical area of low attenuation extending from the right common femoral vein. Correlate with history of intervention such as catheterization. Cylindrical area of low attenuation could represent a tract from prior hemorrhage. Pancreatic calcifications which are more focal in the pancreatic tail are nonspecific but likely sequelae of chronic pancreatitis. Mild stranding in the subcutaneous fat of the anterior abdomen, which is nonspecific according to the radiologist.   HISTORY OF PRESENT ILLNESS: The patient is a 59 year old Caucasian male with history of coronary artery disease, status post cardiac catheterization for acute myocardial infarction approximately four weeks ago who presented to the hospital on 09/06/2011 with complaints of abdominal pain. Please refer to Dr. Steve Rattler admission note on 09/06/2011.  The patient was complaining of severe lower abdominal pain sometimes radiating to the back. He also does have history of lower back pain. He denied any nausea or vomiting, however admitted significant constipation. On arrival to the Emergency Room, the patient's vitals showed a temperature of 97.6, heart rate 129,  respiratory rate 22, blood pressure 157/94, saturation was 100% on room air. Physical examination showed some diffuse tenderness in the lower abdomen in the middle. Good bowel sounds were noted. No rebound tenderness or rigidity noted.  No hepatosplenomegaly or bruits or masses were noted.   LABORATORY DATA:  Glucose of 128, potassium of 3.4, otherwise unremarkable BMP. The patient's lipase level was normal at 112. Alkaline phosphatase was slightly elevated at 143. Otherwise liver enzymes were normal. Cardiac enzymes, first set, as well as subsequent  two more sets were within normal limits. CBC was within normal limits. Urinalysis was normal. The patient's lactic acid was slightly elevated at 1.8. EKG showed normal sinus rhythm at a rate of 99 beats per  minute. Normal axis. No acute ST-T changes were noted. The patient's radiologic studies were done and three-way abdominal x-ray was unremarkable. CT scan of abdomen was done which was unremarkable except for pancreatic calcifications and some stranding in the abdominal wall as well as right common femoral vein, which was concerning for possible tract from prior hemorrhage. The patient was admitted to the hospital for observation. He was given some pain medications and consultation with surgeon was obtained. Dr. Marina Gravel saw the patient in consultation on 09/07/2011. He felt that the patient's diagnosis is diastasis recti, abdominal pain nonsurgical etiology, likely constipation related.  He recommended continuing aggressive bowel regimen.  No indication for surgery at this time. No followup was recommended by Dr. Marina Gravel.  The patient was recommended to continue diet full of greens to improve bulkiness of his stool and increase motility.  The patient was given Colace as well as Senna and MiraLAX prescriptions to improve his bowel movements.  He is being discharged today in stable condition with the above-mentioned medications and followup. On the day of discharge temperature 98, pulse 86, respiration rate 18, blood pressure 130/82, saturation was 98% on room air at rest.   In regards to other medical issues such as coronary artery disease, hypertension, hyperlipidemia, and diabetes mellitus, the patient is to continue his outpatient medications. No changes were made in his prior medical management.   TIME SPENT:  40 minutes.   ____________________________ Theodoro Grist, MD rv:bjt D: 09/07/2011 19:59:29 ET T: 09/08/2011 10:46:36 ET JOB#: [id number]  cc: Theodoro Grist, MD, <Dictator> Floria Raveling. Astrid Divine, MD Algernon Huxley, MD Aneth MD ELECTRONICALLY SIGNED 10/03/2011 09:40

## 2014-12-15 NOTE — H&P (Signed)
PATIENT NAME:  Maurice Patterson, Maurice Patterson MR#:  [id number] DATE OF BIRTH:  1955-12-27  DATE OF ADMISSION:  09/06/2011  PRIMARY CARE PHYSICIAN: Dr. [name].   CHIEF COMPLAINT: Lower abdominal pain.   HISTORY OF PRESENT ILLNESS: A 59 year old male who has history of coronary artery disease. He was last admitted in December 2012 when he had a myocardial infarction and his catheterization showed that he had in-stent restenosis of his drug-eluting stent. He was transferred to [facility] at that time and he got a new stent reinserted. He also has history of diabetes, hypertension, hyperlipidemia, chronic back pain. Today, he presented to the Emergency Room with severe abdominal pain in the lower abdomen, mainly in the hypogastric area. The patient presented with severe lower abdominal pain, sometimes it is radiating to the back he says. He denies any nausea or vomiting. He Maurice Patterson the pain was more severe than any of his previous heart attacks combined. He denies any nausea. He says the pain is more when he lies down and it actually gets better when he stands up or when he walks around. He has never had significant kind of abdominal pain in the past. He denies any fever. He denies any chest pain, shortness of breath. He denies any diarrhea, any urinary complaints. He had an extensive work-up done in the Emergency Room. He had a normal white count of 9.4 and normal hemoglobin of 15.3. He had a normal lipase of 112. He had a CT of the abdomen and pelvis done which showed that the patient had only pancreatic calcifications and mild stranding in the subcutaneous fat of the anterior abdomen which is nonspecific. No acute abnormality of the CT scan. He initially thought that this may be his hernia causing him problems, but there is no suggestion of any incarcerated hernia. He says as far as his heart is concerned, everything is stable at this time. He is following with his cardiologist. He is compliant with his medications. He was  previously using oxycodone p.r.n. for chronic back pain, but currently, he is using tramadol as needed. Hospitalist was asked to admit the patient because of intractable abdominal pain with unclear etiology. He got about 75 mcg IV fentanyl and 3 mg of IV Dilaudid in the Emergency Room.   REVIEW OF SYSTEMS: He denies any fever or weakness. HEENT: No acute change in vision. No headache. No dizziness. No cough. No dyspnea. No chest pain right now. No dyspnea on exertion. No nausea, but complaining of abdominal pain. No gastrointestinal bleed. No dysuria. No frequency. No thyroid problems. No anemia. No rash. No joint pains or swelling. No focal numbness or weakness. No anxiety or depression.   PAST MEDICAL HISTORY:  1. Coronary artery disease. He recently had a subendocardial myocardial infarction on 07/31/2011 and he was transferred to [facility] for in-stent restenosis of his drug-eluting stent. 2. Diabetes. 3. Hypertension. 4. Hyperlipidemia. 5. Chronic back pain.   PAST SURGICAL HISTORY: The only thing is his stent replacements.   HOME MEDICATIONS: (which have been confirmed by the pharmacy tech here) 1. Amlodipine 5 mg daily. 2. Aspirin 81 mg daily.  3. Plavix 75 mg daily.  4. Gabapentin 100 mg 3 times a day.  5. Humalog 15 units t.i.d.  6. Lantus 50 units twice a day. 7. Losartan 100 mg daily.  8. Metoprolol ER 25 mg daily.  9. Niaspan ER 1000 milligrams at bedtime.  10. Nitroglycerin p.r.n.  11. Simvastatin 20 mg at bedtime.  12. Tramadol 50 mg every  six hours p.r.n.   SOCIAL HISTORY: He lives with his wife. He quit smoking he says when his first stent was placed. He denies any alcohol or drug use.   FAMILY HISTORY: He has a brother who has coronary artery disease at the age of 67 and sister with diabetes mellitus.   PHYSICAL EXAMINATION:  VITAL SIGNS: When he presented to the Emergency Room, temperature 97.6, heart rate 129, respiratory rate 22, blood pressure 157/94, saturating 100%  on room air. His current vitals include heart rate 107, respiratory rate 24, blood pressure 148/89, saturating 100% on room air.   GENERAL: This is a middle-aged obese Caucasian male. He is actually pacing around in the room. When he tries to lie down, he is complaining of abdominal pain.   HEENT: Bilateral pupils are equal. Extraocular muscles intact. No scleral icterus. No conjunctivitis. Oral mucosa is moist. No pallor.   NECK: No thyroid tenderness, enlargement or nodule. Neck is supple. No masses, nontender. No adenopathy. No JVD. No carotid bruit.   CHEST: Bilateral breath sounds are clear. No wheeze. Normal effort. No respiratory distress.   HEART: Heart sounds are regular. No murmur. Good peripheral pulses. No lower extremity edema.   ABDOMEN: He has some diffuse tenderness, more in the lower abdomen in the middle. Good bowel sounds. He has no rebound tenderness, no rigidity. No hepatosplenomegaly. No bruit. No masses.   RECTAL: Deferred.   NEUROLOGICAL: He is awake, alert, oriented to time, place, and person. Cranial nerves are intact. Moving all extremities against gravity.   EXTREMITIES: No cyanosis or clubbing.   SKIN: No rash. No lesions.   LABORATORY, DIAGNOSTIC, AND RADIOLOGICAL DATA: White count 9.4, hemoglobin 15.3, platelet count 225,000. BMP: Sodium 139, potassium 3.4, BUN 14, creatinine 0.86. His LFTs: alkaline phosphatase is slightly elevated at 143. It has actually improved from last time. It was 173 last time.  Urinalysis: nitrite negative, leukocyte esterase negative. Blood negative. His lactic acid was 1.8. He had a CT of the abdomen and pelvis with contrast which showed that he has no abdominal aortic aneurysm or dissection, pancreatic calcifications, more focal in the pancreatic tail. Mild stranding of the subcutaneous fat of the anterior abdomen which is nonspecific. Abdominal x-ray: Unremarkable abdominal series. His EKG shows that he has sinus rhythm, normal axis,  no acute ischemic changes, unchanged from prior EKGs.   IMPRESSION:  1. Abdominal pain of unclear etiology, intractable pain.  2. Mild hypokalemia.  3. Coronary artery disease with recent stent placement. 4. Diabetes, insulin-dependent. 5. Hypertension. 6. Hyperlipidemia. 7. Chronic back pain.   PLAN: A 60 year old male who has coronary artery disease with recent stent placement at [facility]. He also has history of diabetes, hypertension, and hyperlipidemia. He has severe abdominal pain in the lower abdomen, mainly when he walks around. He got multiple IV pain medications here. His wife was saying that he was sweating when he had the pain. He was also tachycardic when he came in because of the pain. He has some guarding, but no rigidity or rebound tenderness. His white count is normal. His urinalysis is negative. His LFTs are normal. His creatinine is normal. His CT abdomen and pelvis is essentially negative except for some stranding in the anterior abdomen. We will get a surgery consult on him that has already been requested from the Emergency Room. We will give him fluids, keep him n.p.o. except medications. He says he is allergic to Protonix, so cannot give him PPI. Will give him some Zantac. If  his abdominal pain gets more severe, then we may have to reimage him. We will follow his labs in the morning. We will await for surgery recommendations also. We will get serial cardiac enzymes on him. I doubt this is anything cardiac because it is in the lower abdomen. His EKG is normal. Because we are going to keep him n.p.o., I am going to hold his Humalog at this time and decrease his Lantus. We will continue aspirin, Plavix, metoprolol, losartan for his coronary artery disease. We will give him IV Zofran p.r.n. and IV morphine p.r.n. We will hold his tramadol at this time because I am giving him IV morphine p.r.n. We will keep him on observation.   TIME SPENT WITH ADMISSION: 55 minutes.     ____________________________ Mena Pauls, MD ag:ap D: 09/06/2011 17:14:26 ET T: 09/06/2011 17:49:12 ET JOB#: [id number]  cc: Mena Pauls, MD, <Dictator> Floria Raveling. Astrid Divine, MD Mena Pauls MD ELECTRONICALLY SIGNED 09/17/2011 10:55

## 2014-12-16 ENCOUNTER — Inpatient Hospital Stay
Admit: 2014-12-16 | Disposition: A | Discharge status: Home / Self Care | Payer: Self-pay | Attending: Internal Medicine | Admitting: Internal Medicine

## 2014-12-16 LAB — CBC WITH DIFFERENTIAL/PLATELET
BASOS ABS: 0 10*3/uL (ref 0.0–0.1)
Basophil %: 0.3 %
EOS PCT: 0.3 %
Eosinophil #: 0 10*3/uL (ref 0.0–0.7)
HCT: 44.3 % (ref 40.0–52.0)
HGB: 15.4 g/dL (ref 13.0–18.0)
Lymphocyte #: 1.8 10*3/uL (ref 1.0–3.6)
Lymphocyte %: 18.1 %
MCH: 32.1 pg (ref 26.0–34.0)
MCHC: 34.7 g/dL (ref 32.0–36.0)
MCV: 93 fL (ref 80–100)
MONOS PCT: 10.1 %
Monocyte #: 1 x10 3/mm (ref 0.2–1.0)
Neutrophil #: 7.1 10*3/uL — ABNORMAL HIGH (ref 1.4–6.5)
Neutrophil %: 71.2 %
PLATELETS: 181 10*3/uL (ref 150–440)
RBC: 4.79 10*6/uL (ref 4.40–5.90)
RDW: 13.5 % (ref 11.5–14.5)
WBC: 10 10*3/uL (ref 3.8–10.6)

## 2014-12-16 LAB — BASIC METABOLIC PANEL
ANION GAP: 8 (ref 7–16)
BUN: 11 mg/dL
Calcium, Total: 8.2 mg/dL — ABNORMAL LOW
Chloride: 104 mmol/L
Co2: 25 mmol/L
Creatinine: 0.72 mg/dL
EGFR (African American): >60
EGFR (Non-African Amer.): >60
Glucose: 175 mg/dL — ABNORMAL HIGH
Potassium: 2.7 mmol/L — ABNORMAL LOW
SODIUM: 137 mmol/L

## 2014-12-16 LAB — CYTOLOGY - NON PAP

## 2014-12-16 LAB — SEDIMENTATION RATE: Erythrocyte Sed Rate: 31 mm/hr — ABNORMAL HIGH (ref 0–20)

## 2014-12-16 LAB — AMMONIA: Ammonia, Plasma: 32 umol/L

## 2014-12-17 LAB — COMPREHENSIVE METABOLIC PANEL
ALT: 47 U/L
Albumin: 3.2 g/dL — ABNORMAL LOW
Alkaline Phosphatase: 178 U/L — ABNORMAL HIGH
Anion Gap: 6 — ABNORMAL LOW (ref 7–16)
BUN: 9 mg/dL
Bilirubin,Total: 0.4 mg/dL
CALCIUM: 7.9 mg/dL — AB
Chloride: 104 mmol/L
Co2: 28 mmol/L
Creatinine: 0.79 mg/dL
EGFR (African American): >60
EGFR (Non-African Amer.): >60
Glucose: 293 mg/dL — ABNORMAL HIGH
POTASSIUM: 3.1 mmol/L — AB
SGOT(AST): 45 U/L — ABNORMAL HIGH
SODIUM: 138 mmol/L
Total Protein: 6.3 g/dL — ABNORMAL LOW

## 2014-12-17 LAB — HEMOGLOBIN A1C: Hemoglobin A1C: 9.3 % — ABNORMAL HIGH

## 2014-12-17 LAB — MAGNESIUM: Magnesium: 1.8 mg/dL

## 2014-12-17 LAB — POTASSIUM: Potassium: 3.7 mmol/L

## 2014-12-22 NOTE — Discharge Summary (Signed)
PATIENT NAME:  Maurice Patterson, Maurice Patterson MR#:  [id number] DATE OF BIRTH:  Mar 08, 1956  DATE OF ADMISSION:  12/16/2014 DATE OF DISCHARGE:  12/17/2014  ADMITTING COMPLAINT: Altered mental status.   DISCHARGE DIAGNOSES: 1.  Episodic delirium, possibly due to medication, resolved at the time of discharge.  2.  Sinusitis.   3.  Coronary artery disease, status post myocardial infarction x 2.  4.  Hypertension.  5.  Diabetes mellitus type 2, poor control.  6.  Chronic back pain.  7.  History of cerebrovascular accident.  8.  History of bowel obstruction.  9.  History of gastrointestinal bleed, on Brilinta.   CONSULTATIONS: 1.  Dr. Valora Corporal, neurology.  2.  Dr. Leotis Pain, neurology.   PROCEDURES:  1.  EEG, results pending at the time of this dictation, performed 12/16/2014.  2.  CT scan of the head without contrast showed no evidence of acute infarction, mass lesion or intra or extra-axial hemorrhage on CT. There is mild cortical volume loss and scattered small vessel ischemic microangiopathy. Mild mucosal thickening at the right maxillary sinus.  3.  Chest x-ray shows no active disease.   HISTORY OF PRESENT ILLNESS: This 59 year old man presents to the Emergency Room with multiple complaints. Most concerning to his family is that he has been delirious and incoherent for several hours. 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 He was in the car with his family. He requested that they stop in a parking lot so he could get out and stretch his legs due to chronic back pain. At that point, he doubled over stating that his stomach was hurting. He seemed to be crying and once he got back into the car he was acutely confused which prompted his family to bring him to the Emergency Room. On arrival, the patient had a heart rate in the 130s and was not speaking coherently. He was admitted for observation as his vitals were stable and his mental  status seemed to be improving at the time of the initial examination.   HOSPITAL COURSE BY PROBLEM:  1.  Episodic delirium: This patient has had 2 similar presentations to this facility and has also been seen at [facility] for similar symptoms in the past. He has had a workup by outpatient neurologist, Dr. Melrose Nakayama with [facility] neurology and also at [facility] for these episodes. At this point, the etiology is unknown. It seems to be either medication related or possibly related to insomnia. Upon his initial presentation for this type of complaint in January of this year, he was started on Seroquel at night and after a few nights of good sleep he returned to his normal cognitive function. During this hospitalization, I have started him on Mirtazapine and on the night prior to discharge, he has had 5 consecutive hours of sleep and again his mental status has improved significantly by the time of discharge. I have also discontinued Neurontin in hopes that this will help clear his cognitive function in the future. At the time of discharge, he is alert and oriented and in his usual state of cognition. He is with his wife, who feels that he is at his baseline. He was seen by neurology during this hospitalization and had an EEG which is not yet read. His CT scan is negative for acute change. He cannot tolerate an MRI due to claustrophobia and chronic back pain, being unable to sit still. Of note, his RPR is negative. Vitamin  B12 is negative. CRP is very elevated. His folate is normal. His plasma ammonia level was normal. He does have some transaminitis likely due to fatty liver disease.  2.  Diabetes mellitus type 2 with poor control: I did check a hemoglobin A1c during this hospitalization and it was 9.3, indicating poor outpatient control. During the hospitalization blood sugars were slightly elevated. He will follow up with his primary care physician regarding this issue.  3.  Hypertension: Blood pressure well controlled during the  admission. Continue on his home regimen.  4.  History of coronary artery disease, status post myocardial infarction x 2: He has better diabetes control. He remains on aspirin, Plavix, metoprolol. He is not currently on a statin. There was no chest pain or anginal equivalent during the hospitalization.   DISCHARGE PHYSICAL EXAMINATION: VITAL SIGNS: Temperature 98.1, pulse 91, respirations 18, blood pressure 133/81, oxygenation 94% on room air.  GENERAL: No acute distress.  CARDIOVASCULAR: Regular rate and rhythm. No murmurs, rubs, or gallops. No peripheral edema. Peripheral pulses 2+. He does have varicosities in the bilateral lower extremities with some changes of chronic venous stasis.  ABDOMEN: Soft, nontender, nondistended. No guarding or rebound. No hepatosplenomegaly.  RESPIRATORY: Lungs are clear to auscultation bilaterally with good air movement. No wheezes, rhonchi or rales.  PSYCHIATRIC: He is alert, fairly oriented with fair insight into his clinical condition.   LABORATORY DATA: Sodium 138, potassium 3.1. This was repleted prior to discharge. Chloride 104, bicarbonate 28, BUN 9, creatinine 0.79, glucose 293, magnesium 1.8. Hemoglobin A1c 9.3, ammonia 32. LFTs with total protein 6.3, albumin 3.2, alkaline phosphatase 178, AST 45, ALT 47, troponin on admission was less than 0.03. TSH 0.74. White blood cells 10.0. ESR is 31, hemoglobin 15.4, platelets 181,000. MCV is 93. UA was negative for signs of infection.   DISCHARGE MEDICATIONS: 1.  Clopidogrel 75 mg 1 tablet daily.  2.  Aspirin 81 mg 1 tablet daily.  3.  Nitrostat 0.4 mg sublingual 1 tablet every 5 minutes as needed for chest pain. May repeat up to 3 times, if no relief, call MD.  4.  Tylenol 500 mg 1 tablet 3 times a day as needed for pain.  5.  Metoprolol succinate 25 mg 1 tablet once a day.  6.  Furosemide 40 mg 1 tablet once a day.  7.  K-Tab 10 mEq 1 tablet twice a day  8.  Cyclobenzaprine 5 mg 1 tablet 3 times a day as  needed for muscle spasm.  9.  Humalog 100 units/mL subcutaneously 6 units 3 times a day with meals plus sliding scale coverage.  10.   Lantus 100 units per mL, 60 units once a day at bedtime.  11.   Quetiapine 25 mg 1 tablet twice a day as needed for agitation.  12.   Mirtazapine 15 mg 1 tablet once a day at bedtime.  13.   Hydrocortisone topical cream applied topically to affected area twice a day.  14.   Azithromycin 250 mg 1 tablet once a day for 3 days and then stop.   Note, the patient has been instructed to stop taking gabapentin.   CONDITION ON DISCHARGE: Stable.   DISPOSITION: He is discharged to home with no further home health needs.   DISCHARGE INSTRUCTIONS:   DIET: Low-sodium, low-fat, low-cholesterol, carbohydrate-controlled ADA diet.   ACTIVITY LIMITATIONS: None, as tolerated.   TIMEFRAME FOR FOLLOWUP: Please follow up within 1 to 2 weeks with your primary care provider.    TIME  SPENT ON DISCHARGE: 45 minutes.    ____________________________ Earleen Newport. Volanda Napoleon, MD cpw:at D: 12/17/2014 21:20:56 ET T: 12/18/2014 10:38:05 ET JOB#: [id number]  cc: Barnetta Chapel P. Volanda Napoleon, MD, <Dictator> Aldean Jewett MD ELECTRONICALLY SIGNED 12/19/2014 7:34

## 2014-12-22 NOTE — Consult Note (Signed)
Chief Complaint:  Subjective/Chief Complaint Just returned from MRCP. Eating. Only mild abdominal pain. Much more alert today. Still not sleeping.   VITAL SIGNS/ANCILLARY NOTES: **Vital Signs.:   29-Jan-16 11:00  Vital Signs Type Routine  Temperature Temperature (F) 97.9  Celsius 36.6  Temperature Source oral  Pulse Pulse 91  Respirations Respirations 20  Systolic BP Systolic BP 948  Diastolic BP (mmHg) Diastolic BP (mmHg) 84  Mean BP 102  Pulse Ox % Pulse Ox % 98  Pulse Ox Activity Level  At rest  Oxygen Delivery Room Air/ 21 %   Brief Assessment:  GEN no acute distress   Respiratory clear BS   Gastrointestinal mild upper abdominal tenderness   Lab Results: Thyroid:  28-Jan-16 04:07   Thyroid Stimulating Hormone 1.70 (0.45-4.50 (IU = International Unit)  ----------------------- Pregnant patients have  different reference  ranges for TSH:  - - - - - - - - - -  Pregnant, first trimetser:  0.36 - 2.50 uIU/mL)  Routine Chem:  28-Jan-16 04:07   Cholesterol, Serum 73  Triglycerides, Serum 98  HDL (INHOUSE)  23  VLDL Cholesterol Calculated 20  LDL Cholesterol Calculated 30 (Result(s) reported on 19 Sep 2014 at 05:12AM.)   Assessment/Plan:  Assessment/Plan:  Assessment Chronic pancreatitis. Clinically improved.   Plan Await MRCP results. Continue creon with meals. If MRCP inadequate, will schedule EUS later with [facility]. thanks   Electronic Signatures: Verdie Shire (MD)  (Signed 29-Jan-16 14:53)  Authored: Chief Complaint, VITAL SIGNS/ANCILLARY NOTES, Brief Assessment, Lab Results, Assessment/Plan   Last Updated: 29-Jan-16 14:53 by Verdie Shire (MD)

## 2014-12-22 NOTE — H&P (Signed)
PATIENT NAME:  Maurice Patterson, Maurice Patterson MR#:  [id number] DATE OF BIRTH:  24-Nov-1955  REFERRING PHYSICIAN:  Valli Glance. Owens Shark, MD  PRIMARY CARE DOCTOR: Floria Raveling. Astrid Divine, MD  ADMIT DIAGNOSES: 1.  Chest pain. 2.  Abdominal pain.   HISTORY OF PRESENT ILLNESS: This is a 59 year old Caucasian male who presents to the Emergency Department complaining of abdominal pain, back pain, and diaphoresis. These symptoms are concerning because they are exactly as his previous myocardial infarctions have presented. The patient did have 1 episode of vomiting. It was nonbloody and nonbilious. He describes progressively declining health over the last few weeks. He has had chronic diarrhea that is yellow and slimy. He also has been fatiguing very easily and has had blood sugars reportedly more than 600. He was found to have chronic pancreatitis in the Emergency Department as well as a diffuse fatty liver and nonspecific pancreatic ductal dilatation. Due to the multiple complaints and potential severity of their significance, the Emergency Department called for admission.   REVIEW OF SYSTEMS:  CONSTITUTIONAL:  The patient denies fever, but admits to generalized weakness.  EYES: Denies blurred vision or inflammation.  EARS, NOSE, THROAT: Denies tinnitus or sore throat.  RESPIRATORY: Denies shortness of breath or cough.  CARDIOVASCULAR: Admits to posterior chest pain, but denies palpitations, paroxysmal nocturnal dyspnea or orthopnea.  GASTROINTESTINAL: Admits to nausea and vomiting as well as abdominal pain, diarrhea, the latter of which is chronic.  GENITOURINARY: Denies dysuria, increased frequency, or hesitancy of urination.  ENDOCRINE: Denies polyuria or polydipsia.  HEMATOLOGIC AND LYMPHATIC: Denies bleeding, but admits to easy bruising.  INTEGUMENTARY: Denies rashes, but is concerned about the appearance of his feet and  vasculature of his lower extremities.  MUSCULOSKELETAL: Denies arthralgias or myalgias.   NEUROLOGIC: Denies numbness in his extremities or dysarthria.  PSYCHIATRIC: Denies suicidal ideation or depression.   PAST MEDICAL HISTORY: Hypertension, diabetes type 2, coronary artery disease. status post 2 myocardial infarctions, history of bowel obstruction, history of cerebrovascular accident, history of GI bleed on Brilinta.   PAST SURGICAL HISTORY: Stent placement x 2, ORIF and fusion of the right wrist.   SOCIAL HISTORY: The patient is a former smoker. He smoked 1-1/2 packs per day for approximately 15 years. He denies drug use and denies drinking alcohol for the last 35 years.   FAMILY HISTORY: Both the patient's mother and father had diabetes. His mother died from complications of the same and his father is deceased of ALS. His brother also has coronary artery disease.   MEDICATIONS: 1.  Amlodipine 5 mg 1 tablet p.o. daily.  2.  Aspirin 81 mg 1 tablet p.o. daily.  3.  Clopidogrel 75 mg 1 tab p.o. daily.  4.  Colace 100 mg 1 tablet p.o. b.i.d.  5.  Furosemide 20 mg 1-2 tablets p.o. daily as needed for leg swelling.  6.  Gabapentin 100 mg 1 tablet p.o. t.i.d.  7.  Humalog 15 units subcutaneously 3 times a day.  8.  K-Tab 10 mEq extended release 1 tablet p.o. every day that he takes furosemide.  9.  Lantus 50 units subcutaneously 2 times a day.  10. Losartan 100 mg 1 tablet p.o. daily.  11.  Metoprolol succinate extended release tablet 25 mg 1 tablet p.o. daily.  12.  Nitrostat 0.4 mg sublingually every 5 minutes as needed for chest pain.   ALLERGIES: AMBIEN, [place].   PERTINENT LABORATORY RESULTS AND RADIOGRAPHIC FINDINGS: Serum glucose is 166, BUN 14, creatinine 1.06, serum sodium 137,  potassium 4.3, chloride is 105, bicarbonate is 22. Lipase is 21. Serum albumin is 4, alkaline phosphatase of 228, AST is 59, ALT is 111. Troponin is negative x 2. White blood cell count is 18.4, hemoglobin 18, hematocrit 53.1, platelet count is 224,000, MCV is 94.  Chest x-ray shows  no active disease. Ultrasound of the abdomen shows diffusely increased parenchymal echotexture consistent with fatty infiltration of the liver. The gallbladder and bile ducts appeared normal. CT of the abdomen with contrast shows diffuse fatty infiltration of the liver, pancreatic calcifications consistent with chronic pancreatitis and pancreatic ductal dilatation that is nonspecific. No obstructing lesion is identified and this may be due to chronic pancreatitis, but consider MRI to exclude occult mass.   PHYSICAL EXAMINATION: VITAL SIGNS: Temperature is 98.2, pulse 114, respirations 18, blood pressure 147/85. Pulse oximetry is 97% on 2 liters of oxygen via nasal cannula.  GENERAL: The patient is awake and oriented, but somewhat somnolent. He is in no apparent distress at this time.  HEENT: Normocephalic, atraumatic. Pupils equal, round, and reactive to light and accommodation. Extraocular movements are intact. Mucous membranes are relatively moist. The patient definitely has an odor of fetor hepatis to his breath.  NECK: Trachea is midline. No adenopathy. Thyroid is nonpalpable, nontender.  CHEST: Symmetric, atraumatic.  CARDIOVASCULAR: Tachycardic rate, normal rhythm. Normal S1, S2. No rubs, clicks, or murmurs appreciated.  LUNGS: Clear to auscultation bilaterally. Normal effort and excursion.  ABDOMEN: Positive bowel sounds. Soft, nontender, nondistended. No hepatosplenomegaly. The patient does not have peritoneal signs. There is a ventral hernia that is easily reducible.  GENITOURINARY: Deferred.  MUSCULOSKELETAL: The patient moves all 4 extremities equally; however, he has a fusion of the left wrist which completely limits the range of motion to flexion and extension.  SKIN:  There are no rashes, but the patient does have significant varicose veins.  EXTREMITIES: There is no clubbing, cyanosis, or edema. The patient does have a few beats of asterixis of his right hand.  NEUROLOGIC: Cranial  nerves II-XII are grossly intact. However, the patient is easily confused; granted, he has had some Ativan for claustrophobia during his CT scan, but he clearly has some memory difficulty, which his wife reports is not new following his previous stroke.  PSYCHIATRIC: Mood is normal. Affect is congruent. The patient appears to have fair judgment and insight into his medical condition.   ASSESSMENT AND PLAN: This is a 59 year old male admitted for chest pain as well as abdominal pain.  1.  Chest pain. This is likely referred pain from the patient's abdomen, but he admits that he had the same symptoms with prior myocardial infarctions. So far, his troponins are negative and his EKG is reassuring. We will follow biomarkers and consult cardiology. Continue aspirin and Plavix and place the patient on telemetry.  2.  Hypertension. Continue metoprolol, losartan, and amlodipine.  3.  Abdominal pain. This is probably all secondary to chronic pancreatitis. However, his stool pattern is concerning. The chronic diarrhea may be rare, but recognized side effect of uncontrolled diabetes wherein instead of gastroparesis one gets hypermobile gut. However, the yellow fatty nature of his stool and slimy character of his bowel movements indicate there may be some concern about cholestasis. The patient also has some nonspecific dilation of his pancreatic duct. I placed him on a clear liquid diet and consulted gastroenterology for possible MRCP as there could be some isodense lesions in his pancreas such as a glucagonoma that could explain his dramatically increased insulin requirement  of late.  4.  Diabetes, type 2, uncontrolled per his wife. We will check hemoglobin A1c. I have decreased the patient's basal insulin dosing from b.i.d. dosing to at bedtime dosing. Also, due to the patient's calorie restricted diet, I have reduced the overall dose of insulin as well. I have added sliding scale insulin instead of prandial insulin  while he is here in the hospital. 5.  Transaminitis. Differential diagnosis includes inflammation from a stone or mass affecting the bile ducts.  His confusion, fetor hepatis and asterixis raise concern for elevated ammonia levels and/or liver failure. The patient does not have spider angiomata.  6.  Chronic back pain and radiculopathy. We will continue gabapentin. This could be an anginal equivalent as stated above, but thankfully the patient has reassuring results for acute coronary syndrome rule out.  7.  Obesity. The patient's BMI is 32.1. I encouraged a low fat, low calorie and sodium restricted diet, as well as exercise.  8.  Deep vein thrombosis prophylaxis, heparin.  9.  Gastrointestinal prophylaxis, none.   CODE STATUS: The patient is a full code.  TIME SPENT ON PATIENT CARE AND ADMISSION ORDERS: Approximately 45 minutes.    ____________________________ Norva Riffle. Marcille Blanco, MD msd:LT D: 09/18/2014 17:20:29 ET T: 09/18/2014 19:20:53 ET JOB#: [id number]  cc: Norva Riffle. Marcille Blanco, MD, <Dictator> Norva Riffle Joniel Graumann MD ELECTRONICALLY SIGNED 09/25/2014 7:15

## 2014-12-22 NOTE — Consult Note (Signed)
Chief Complaint:  Subjective/Chief Complaint Less abdominal pain. Tolerating solids. MRCP results noted. NO obvious pancreatic mass.   VITAL SIGNS/ANCILLARY NOTES: **Vital Signs.:   30-Jan-16 08:04  Vital Signs Type Routine  Temperature Temperature (F) 97.9  Celsius 36.6  Pulse Pulse 97  Respirations Respirations 18  Systolic BP Systolic BP 967  Diastolic BP (mmHg) Diastolic BP (mmHg) 77  Mean BP 94  Pulse Ox % Pulse Ox % 97  Pulse Ox Activity Level  At rest  Oxygen Delivery Room Air/ 21 %   Brief Assessment:  GEN no acute distress   Cardiac Regular   Respiratory clear BS   Lab Results: Routine Hem:  30-Jan-16 04:29   Platelet Count (CBC) 188 (Result(s) reported on 21 Sep 2014 at 04:55AM.)   Radiology Results: MRI:    29-Jan-16 14:20, MRCP MR Cholangiogram  MRCP MR Cholangiogram   REASON FOR EXAM:    abnormal pancreatic duct on imaging and chronic   pancreatitis now with transamini  COMMENTS:       PROCEDURE: MR  - MR MRCP  - Sep 20 2014  2:20PM     CLINICAL DATA:  Subsequent encounter for chronic pancreatitis and  abnormal pancreatic duct on CT.    EXAM:  MRI ABDOMEN WITHOUT AND WITH CONTRAST (INCLUDING MRCP)    TECHNIQUE:  Multiplanar multisequence MR imaging of the abdomen was performed  both before and after the administration of intravenous contrast.  Heavily T2-weighted images of the biliary and pancreatic ducts were  obtained, and three-dimensional MRCP images were rendered by post  processing.    CONTRAST:  19 cc MultiHance    COMPARISON:  CT of 09/18/2014 and 09/06/2011.    FINDINGS:  Moderate respiratory motion degradation throughout.    Lower chest: Normal heart size without pericardial or pleural  effusion.    Hepatobiliary: Heterogeneous hepatic steatosis. This accounts for  the CT abnormality, including within the posterior segment right  liver lobe. Example image 10 of series 104 and image 12 series 104.  No intrahepatic ductal  dilatation or focal liver lesion.    Normal gallbladder.    Normal common duct caliber, 6 mm on image 20 of series 19. No  evidence of choledocholithiasis.    Pancreas: Findings of chronic calcific pancreatitis. Side branch  ductectasia and atrophy throughout. The pancreatic duct is dilated  within the neck and head. Up to 9 mm on image 15 of series 106 and  image 29 of series 19. In the pancreatic head, just above the  ampulla, the dilated duct is less apparent. Suggestion of central  hypo intensity on dedicated MRCP image 27 of series 19. No dominant  obstructing mass.    Spleen: Normal    Adrenals/Urinary Tract: Normal adrenal glands. Tiny interpolar left  renallesion which is likely a cyst. Normal right kidney, without  hydronephrosis.    Stomach/Bowel: Normal abdominal bowel loops.  Normal stomach.    Vascular/Lymphatic: Aortic and branch vessel atherosclerosis. No  retroperitoneal or retrocrural adenopathy.    Other: No ascites.  Musculoskeletal: Degenerative disc disease throughout the lumbar  spine.     IMPRESSION:  1. Moderately motion degraded exam throughout.  2. Findings of chronic calcific pancreatitis. Pancreatic duct  dilatation in the neck and head. No evidence of obstructive mass.  Cannot exclude stricture or or even small stones within the  pancreatic duct just above the ampulla. No complicating acute  pancreatitis. If pancreatic duct stricture or stone is a clinical  concern, ERCP should be considered.  3. Heterogeneous hepatic steatosis.      Electronically Signed    By: Abigail Miyamoto M.D.    On: 09/20/2014 14:59         Verified By: Areta Haber, M.D.,   Assessment/Plan:  Assessment/Plan:  Assessment Acute on chronic pancreatitis. Clinically improved. Eating. No vomiting.   Plan Stable for discharge today. Stay on low fat diet. Continue creon with every meal/snack. EUS as outpt later. Will sign off. Thanks.   Electronic Signatures: Verdie Shire (MD)  (Signed 30-Jan-16 09:40)  Authored: Chief Complaint, VITAL SIGNS/ANCILLARY NOTES, Brief Assessment, Lab Results, Radiology Results, Assessment/Plan   Last Updated: 30-Jan-16 09:40 by Verdie Shire (MD)

## 2014-12-22 NOTE — Consult Note (Signed)
Pt seen and examined. Please see K. Jerelene Redden' notes. Pt with acute on chronic pancreatitis. Unclear as to the etiology of his chronic pancreatitis. No alcohol. No GB issues. Elevated triglyceride hx? Pt lethargic and confused. Unclear if ambien patient received earlier may be contributing to this. Both U/S and CT show normal CBD. Thus, ERCP is not indicated. Diffuse calcification of pancreas and PD dilation show progression of his chronic pancreatitis. Will check his cholesterol levels. Check for autoimmune pancreatitis. With his daily intake of tylenol, will check his tylenol level. Will need to review records from [facility]. Once pain subsides, will need to resume Creon at high doses as he resumes diet. Will follow. Thanks.  Electronic Signatures: Verdie Shire (MD)  (Signed on 28-Jan-16 07:58)  Authored  Last Updated: 28-Jan-16 07:58 by Verdie Shire (MD)

## 2014-12-22 NOTE — Consult Note (Signed)
Referring Physician:  Harrie Foreman   Primary Care Physician:  [facility], [facility], [place], [place] [address], [place] [phone]  Reason for Consult: Admit Date: 14-Dec-2014  Chief Complaint: confusion  Reason for Consult: confusion   History of Present Illness: History of Present Illness:   seen at request of Dr. Volanda Napoleon for intermittent confusion;  59 yo RHD M presents to [facility] secondary to confusion and tachycardia.  Pt had a normal day in the morning and then developed abdominal pain followed by intermittent confusion.  This has happened before and family is concerned.  There were no convulsion, tongue biting, incontinence or known post-ictal stage.  Symptoms seem to wax and wane per family.  Per family, he has had this confusion since Easter 2015, he still is fluctuating.  This has not changed but the new thing was slurred speech.  ROS:  General denies complaints   HEENT no complaints   Lungs no complaints   Cardiac no complaints   GI nausea/vomiting  pain   GU no complaints   Musculoskeletal no complaints   Extremities no complaints   Skin no complaints   Neuro no complaints   Endocrine no complaints   Psych no complaints   Past Medical/Surgical Hx:  TIA - Transient Ischemic Attack:   Pancreatic Cancer:   CVA/Stroke:   bowel obstruction '13:   Myocardial Infarct:   carpal tunnel:   arthritis:   High Cholesterol:   Hypertension:   Diabetes Mellitus, Type II (NIDD):   Stent - Cardiac:   wrist surgery:   elbow surgery:   Past Medical/ Surgical Hx:  Past Medical History reviewed by me as above   Past Surgical History reviewed by me as abovve   Home Medications: Medication Instructions Last Modified Date/Time  QUEtiapine 100 mg oral tablet 1 tab(s) orally once a day (at bedtime) 23-Apr-16 18:41  Humalog 100 units/mL subcutaneous solution 6 unit(s) subcutaneous 3 times a day with meals plus Sliding scale  coverage as directed. 23-Apr-16 18:41  K-Tab 10 mEq tablet, extended release take 1 tab daily by mouth for each furosemide tablet taken daily tiwice a day 23-Apr-16 18:41  Metoprolol Succinate ER 25 mg oral tablet, extended release 1 tab(s) orally once a day 23-Apr-16 18:41  furosemide 40 mg oral tablet 1 tab(s) orally once a day 23-Apr-16 18:41  cyclobenzaprine 5 mg oral tablet 1 tab(s) orally 3 times a day, As Needed, muscle spasm , As needed, muscle spasm 23-Apr-16 18:41  QUEtiapine 50 mg oral tablet 1 tab(s) orally once a day (at bedtime) 23-Apr-16 18:41  Lantus 100 units/mL subcutaneous solution 60 unit(s) subcutaneous once a day (at bedtime) 23-Apr-16 18:41  gabapentin 100 mg oral capsule 3 cap(s) orally 3 times a day 23-Apr-16 18:41  Tylenol 500 mg oral tablet  orally 3 times a day as needed for pain 23-Apr-16 18:41  aspirin 81 mg oral tablet 1 tab(s) orally once a day 23-Apr-16 18:41  Nitrostat 0.4 mg sublingual tablet 1 tab(s) sublingual  every 5 minutes as needed for chest pain may repeat  up to 3 times if no relief call md or go to emergency room. 23-Apr-16 18:41  clopidogrel 75 mg oral tablet 1 tab(s) orally once a day  23-Apr-16 18:41   Allergies:  Ambien: Swelling, Alt Ment Status  Brilinta: Alt Ment Status  Lorazepam: Alt Ment Status  Codeine: Swelling  Trazodone: Blisters  Allergies:  Allergies as above   Social/Family History: Employment Status:  currently employed  Lives With: spouse  Living Arrangements: house  Social History: no tob, no EtOH, no illicits  Family History: no seizures, no stroke   Vital Signs: **Vital Signs.:   24-Apr-16 21:26  Vital Signs Type Routine  Temperature Temperature (F) 98.6  Celsius 37  Temperature Source oral  Pulse Pulse 90  Respirations Respirations 20  Systolic BP Systolic BP 536  Diastolic BP (mmHg) Diastolic BP (mmHg) 92  Mean BP 114  Pulse Ox % Pulse Ox % 98  Pulse Ox Activity Level  At rest  Oxygen Delivery Room Air/  21 %   Physical Exam: General: nl weight, NAD  HEENT: normocephalic, sclera nonicteric, oropharynx clear except for small cold sore  Neck: supple, no JVD, no bruits  Chest: CTA B, no wheezing  Cardiac: RRR, no murmurs, no edema, 2+ pulses  Extremities: no C/C/E, FROM   Neurologic Exam: Mental Status: alert and oriented x 2 not time, normal naming and repetition, follows simple commands, moderate dysarthria  Cranial Nerves: PERRLA, EOMI with slow saccades and poor upgaze, nl VF, slight R droop, tongue midline, shoulder shrug equal  Motor Exam: mild R drift, increased tone and bradykinesia noted R>L, no tremor  Deep Tendon Reflexes: 1+/4 B, plantars downgoing B, no Hoffman  Sensory Exam: pinprick, temperature, and vibration intact B  Coordination: FTN and HTS WNL   Lab Results:  Thyroid:  23-Apr-16 18:33   Thyroid Stimulating Hormone 0.704 (0.350-4.500 NOTE: New Reference Range  10/29/14)  Hepatic:  23-Apr-16 18:33   Bilirubin, Total 0.6 (0.3-1.2 NOTE: New Reference Range  10/29/14)  Alkaline Phosphatase  269 (38-126 NOTE: New Reference Range  10/29/14)  SGPT (ALT) 60 (17-63 NOTE: New Reference Range  10/29/14)  SGOT (AST)  47 (15-41 NOTE: New Reference Range  10/29/14)  Total Protein, Serum 7.8 (6.5-8.1 NOTE: New Reference Range  10/29/14)  Albumin, Serum 4.1 (3.5-5.0 NOTE: New reference range  10/29/14)  Routine Chem:  23-Apr-16 18:33   Glucose, Serum  381 (65-99 NOTE: New Reference Range  10/29/14)  BUN 12 (6-20 NOTE: New Reference Range  10/29/14)  Creatinine (comp) 0.85 (0.61-1.24 NOTE: New Reference Range  10/29/14)  Sodium, Serum 136 (135-145 NOTE: New Reference Range  10/29/14)  Potassium, Serum  3.0 (3.5-5.1 NOTE: New Reference Range  10/29/14)  Chloride, Serum 103 (101-111 NOTE: New Reference Range  10/29/14)  CO2, Serum 24 (22-32 NOTE: New Reference Range  10/29/14)  Calcium (Total), Serum  8.6 (8.9-10.3 NOTE: New Reference Range   10/29/14)  Anion Gap 9  eGFR (African American) >60  eGFR (Non-African American) >60 (eGFR values <48mL/min/1.73 m2 may be an indication of chronic kidney disease (CKD). Calculated eGFR is useful in patients with stable renal function. The eGFR calculation will not be reliable in acutely ill patients when serum creatinine is changing rapidly. It is not useful in patients on dialysis. The eGFR calculation may not be applicable to patients at the low and high extremes of body sizes, pregnant women, and vegetarians.)  Cardiac:  23-Apr-16 18:33   Troponin I <0.03 (0.00-0.03 0.03 ng/mL or less: NEGATIVE  Repeat testing in 3-6 hrs  if clinically indicated. >0.05 ng/mL: POTENTIAL  MYOCARDIAL INJURY. Repeat  testing in 3-6 hrs if  clinically indicated. NOTE: An increase or decrease  of 30% or more on serial  testing suggests a  clinically important change NOTE: New Reference Range  10/29/14)  Routine UA:  23-Apr-16 18:33   Color (UA) Yellow  Clarity (UA) Clear  Glucose (UA) >=500  Bilirubin (UA) Negative  Ketones (UA) Negative  Specific Gravity (UA) 1.029  Blood (UA) 1+  pH (UA) 6.0  Protein (UA) Negative  Nitrite (UA) Negative  Leukocyte Esterase (UA) Negative (Result(s) reported on 14 Dec 2014 at 07:22PM.)  RBC (UA) 0-5  WBC (UA) 0-5  Bacteria (UA) NONE SEEN  Epithelial Cells (UA) NONE SEEN  Result(s) reported on 14 Dec 2014 at 07:22PM.  Routine Hem:  23-Apr-16 18:33   WBC (CBC) 9.6  RBC (CBC) 4.85  Hemoglobin (CBC) 15.5  Hematocrit (CBC) 45.2  Platelet Count (CBC) 198 (Result(s) reported on 14 Dec 2014 at 07:21PM.)  MCV 93  MCH 31.9  MCHC 34.3  RDW 13.5   Radiology Results: CT:    24-Apr-16 02:20, CT Head Without Contrast  CT Head Without Contrast   REASON FOR EXAM:    delirium  COMMENTS:       PROCEDURE: CT  - CT HEAD WITHOUT CONTRAST  - Dec 15 2014  2:20AM     CLINICAL DATA:  Acute onset of slurred speech and right-sided  weakness. Initial  encounter.    EXAM:  CT HEAD WITHOUT CONTRAST    TECHNIQUE:  Contiguous axial images were obtained from the base of the skull  through the vertex without intravenous contrast.  COMPARISON:  CT of the head performed 11/18/2014    FINDINGS:  There is no evidence of acute infarction, mass lesion, or intra- or  extra-axial hemorrhage on CT.    Prominence of the ventricles and sulci reflects mild cortical volume  loss. Mild periventricular white matter change likely reflects small  vessel ischemic microangiopathy. Cerebellar atrophy is noted.    The brainstem and fourth ventricle are within normal limits. The  basal ganglia are unremarkable in appearance. The cerebral  hemispheres demonstrate grossly normal gray-white differentiation.  No mass effect or midline shift is seen.  There is no evidence of fracture; visualized osseous structures are  unremarkable in appearance. The visualized portions of the orbits  are within normal limits. Mild mucosal thickening is noted at the  right maxillary sinus. The remaining paranasal sinuses and mastoid  air cells are well-aerated. No significant soft tissue abnormalities  are seen.     IMPRESSION:  1. No acute intracranial pathology seen on CT.  2. Mild cortical volume loss and scattered small vessel ischemic  microangiopathy.  3. Mild mucosal thickeningat the right maxillary sinus.      Electronically Signed    By: Garald Balding M.D.    On: 12/15/2014 03:08         Verified By: JEFFREY . CHANG, M.D.,   Radiology Impression: Radiology Impression: CT of head personally reviewed by me and shows mild chronic white matter changes   Impression/Recommendations: Recommendations:   prior notes reviewed by me reviewed by me   Intermittent confusion-  no clear etiology but most likely encephalopathy especially considering increased LFTs;  ?? EtOH, other etiologies could include seizures vs. other metabolic derangementsMild white matter  changes-  stableParkinsonism-  this may be secondary to medications but dopamine agonist can worsen agitation EEG MRI of brain w/o contrast check ESR, CRP, ammonia, B12/folate, urine porphyrians will follow  Electronic Signatures: Jamison Neighbor (MD)  (Signed 25-Apr-16 08:24)  Authored: REFERRING PHYSICIAN, Primary Care Physician, Consult, History of Present Illness, Review of Systems, PAST MEDICAL/SURGICAL HISTORY, HOME MEDICATIONS, ALLERGIES, Social/Family History, NURSING VITAL SIGNS, Physical Exam-, LAB RESULTS, RADIOLOGY RESULTS, Recommendations   Last Updated: 25-Apr-16 08:24 by Jamison Neighbor (MD)

## 2014-12-22 NOTE — Consult Note (Signed)
PATIENT NAME:  Maurice Patterson, Maurice Patterson MR#:  [id number] DATE OF BIRTH:  08-11-1956  DATE OF CONSULTATION:  09/18/2014  REFERRING PHYSICIAN:  Norva Riffle. Marcille Blanco, MD CONSULTING PHYSICIAN:  Corey Skains, MD  REASON FOR CONSULTATION: Known coronary disease, status post myocardial infarction in the past, diabetes, hypertension, hyperlipidemia, cerebrovascular accident with confusion, and abdominal and chest pain.   CHIEF COMPLAINT: The patient is confused at this time.   HISTORY OF PRESENT ILLNESS: This is a 59 year old male with known coronary artery disease, status post multiple stents and myocardial infarction in the past. He also has diabetes, essential hypertension and cerebrovascular accident and a history of GI bleed with recurrent pancreatitis and liver enzyme abnormalities. The patient has had new admission with confusion and back pain from degenerative joint disease, as well as abdominal pain from likely pancreatitis.   The patient has had an EKG today, showing normal sinus rhythm with no evidence of EKG changes and a normal troponin. His symptoms today do not sound like he has new cardiovascular issues, and with family discussion, it does appear that he has had a recent stress test showing some ischemia, but this has been stable on current medical regimen. He has not had any further cardiac catheterization due to ongoing issues with his abdomen. The remainder of review of systems cannot be assessed due to confusion.   PAST MEDICAL HISTORY:  1.  Coronary artery disease status, post previous myocardial infarction.  2.  Diabetes.  3.  Hypertension.  4.  Cerebral cerebrovascular accident.  5.  History of GI bleed.   FAMILY HISTORY: There are positives for cardiovascular disease and hypertension.   SOCIAL HISTORY: Currently denies alcohol or tobacco use.   ALLERGIES: AS LISTED.   MEDICATIONS: As listed.   PHYSICAL EXAMINATION:  VITAL SIGNS: Blood pressure is 110/68, bilaterally. Heart  rate is 98 upright, reclining, and regular.  GENERAL: He is a well appearing male, in no acute distress.  HEAD, EYES, EARS, NOSE, THROAT: No icterus, thyromegaly, ulcers, hemorrhage, or xanthelasma.  CARDIOVASCULAR: Rapid rate, with normal S1 and S2. No apparent murmur, gallop, or rub. PMI is inferiorly displaced. Carotid upstroke normal without bruit. Jugular venous pressure is normal.  LUNGS: Clear to auscultation. Normal respirations.  ABDOMEN: Soft, with diffuse tenderness, some increased distention. No apparent hepatosplenomegaly or masses.  EXTREMITIES: There are 2+ radial, femoral, and dorsal pedal pulses, with no lower extremity edema, cyanosis, clubbing or ulcers.  NEUROLOGIC: He is not oriented to time, place, or person.   ASSESSMENT: A 59 year old male with known coronary artery disease, status post previous myocardial infarction and stenting, diabetes, essential hypertension, previous stroke with peripheral vascular disease and history of GI bleed with abdominal discomfort, most consistent with new onset or recurrent pancreatitis and gastrointestinal changes without evidence of myocardial infarction or progression of cardiovascular disease.   RECOMMENDATIONS:  1.  No further cardiac intervention at this time, due to no evidence of myocardial infarction or significant new cardiac symptoms.  2.  Continue treatment of essential hypertension, if able.  3.  Possible avoidance of statin therapy due to liver enzyme elevation, for further workup of gastroenterology issues, due to patient's current stability with cardiac standpoint. 4.  Continue ambulation.  5.  Following for any further significant symptoms and for adjustments of medications.   ____________________________ Corey Skains, MD bjk:MT D: 09/18/2014 09:06:48 ET T: 09/18/2014 09:34:53 ET JOB#: [id number]  cc: Corey Skains, MD, <Dictator> Corey Skains MD ELECTRONICALLY SIGNED 09/20/2014 8:37

## 2014-12-22 NOTE — Discharge Summary (Signed)
PATIENT NAME:  Maurice Patterson, Maurice Patterson MR#:  [id number] DATE OF BIRTH:  June 13, 1956  DATE OF ADMISSION:  09/18/2014 DATE OF DISCHARGE:  09/24/2014  PRIMARY CARE PROVIDER: Floria Raveling. Astrid Divine, MD  ADMISSION COMPLAINT:  Abdominal pain and chest pain.   DISCHARGE DIAGNOSES: 1.  Acute on chronic pancreatitis.  2.  Delirium due to her insomnia and medication side effect.  3.  Diabetes mellitus, type 2, poorly controlled, with hemoglobin A1c of 9.8.  4.  Coronary artery disease.  5.  Hypertension.  6.  Chronic diarrhea due to malabsorption from chronic pancreatitis.  7.  Insomnia.  8.  Fatty liver disease.   CONSULTATIONS: 1.  Corey Skains, MD, cardiology.  Kinnelon Candace Cruise, MD, gastroenterology.  3.  Leotis Pain, MD, neurology.  PROCEDURES:   1.  CT abdomen and pelvis with contrast, January 27, shows diffuse fatty infiltration of the liver with heterogeneous pattern in the right lobe likely representing heterogeneous fatty infiltration. Pancreatic calcifications consistent with chronic pancreatitis. Pancreatic ductal dilatation which is nonspecific. No obstructing lesion is identified; this may be due to chronic pancreatitis. Consider MRI for further evaluation to exclude occult mass. No acute process demonstrated in the abdomen or pelvis. 2.  MRCP, January 29, shows chronic calcific pancreatitis. Pancreatic duct dilatation in the neck and head. No evidence of obstructive mass. Cannot exclude stricture or even small stone within the pancreatic duct just above the ampulla. No complicating acute pancreatitis. If pancreatic duct stricture or stone is a clinical concern, ERCP should be considered. Heterogeneous hepatic steatosis is present.  HISTORY OF PRESENT ILLNESS: This 59 year old Caucasian man presents to the Emergency Room complaining of abdominal pain, back pain, and diaphoresis. Symptoms concerning because they are similar to his last myocardial infarction. He has also had 1 episode of  nonbloody, nonbilious vomiting. He reports that he has been very tired recently and blood sugars have been greater than 600. He was found to have pancreatitis in the Emergency Room as well as diffuse fatty liver and nonspecific pancreatic ductal dilatation. He was admitted for further evaluation and treatment.  HOSPITAL COURSE BY PROBLEM:  Problem #1.  Acute on chronic pancreatitis: The patient was followed by gastroenterology throughout the hospitalization. He was felt that both ultrasound and CT showed a normal common bile duct. ERCP was not indicated. He was clinically improved soon after admission. Diet was gradually advanced, and at the time of discharge he is tolerating a regular diet with his usual Creon supplements without abdominal pain. His presenting lipase level was 21 and was not followed through the hospitalization as he improved clinically fairly rapidly. His total cholesterol was 73 with triglycerides of 98.  Problem #2.  Delirium:  Though his pancreatic symptoms improved fairly quickly after admission, he did develop severe confusion to the point where his wife was concerned that she would not be able to manage him at home. Neurology was consulted, and it was felt that his confusion was likely due to lack of sleep as well as possibly due to Ativan and Ambien which has been administered for sleep and anxiety earlier in the hospitalization.  Seroquel was started initially at 12.5 mg and then increased to 50 mg at bedtime to help with sleep. All other sedating medications were avoided. By the time of discharge, the patient had 3 nights of sleep and was mentating normally.  Problem #3.  Diabetes mellitus, type 2, poorly controlled: Hemoglobin A1c checked during this admission was 9.8. He is being discharged on  a diabetic diet with insulin recommendations per the medication list below. This will be followed by his primary care physician in the outpatient setting.  Problem #4.  Coronary artery  disease:  On presentation he did have some chest pain, diaphoresis, and nausea which was concerning for possible acute coronary syndrome. His cardiologist, Dr. Nehemiah Massed, was consulted and saw him during the hospitalization. It was felt that his symptoms were not cardiac in nature and no further cardiac intervention was planned. Cardiac enzymes were negative x 3.  Problem #5.  Hypertension: Blood pressure was well controlled throughout the hospitalization on the medication regimen below. This can be followed by his primary care physician.  Problem #6.  Chronic diarrhea due to malabsorption: He continues on Creon.  Problem #7.  Chronic insomnia: Seroquel has been started and seems to be doing well to help him with sleep.  Problem #8.  Fatty liver disease: His statin was discontinued. He will need to follow up with gastroenterology in his primary care physician for this. Needs weight loss and improved blood sugar control.   DISCHARGE PHYSICAL EXAMINATION: VITAL SIGNS: Temperature 97.7, heart rate 98, respirations 18, blood pressure 126/72, oxygenation 95% on room air.  GENERAL: No acute distress.  CARDIOVASCULAR: Regular rate and rhythm. No murmurs, rubs, or gallops.  PULMONOLOGY: Lungs clear to auscultation bilaterally with good air movement.  ABDOMEN: Soft, nontender, nondistended, obese. Bowel sounds are normal. No guarding, no rebound, no hepatosplenomegaly.  PSYCHIATRIC: The patient alert, oriented, with good insight into his clinical condition. No signs of uncontrolled depression or anxiety.  LABORATORY DATA:  Sodium 137, potassium 3.3 (this was repleted with 40 mEq oral potassium prior to discharge), chloride 103, bicarbonate 28, BUN 10, creatinine 0.73 glucose 97. Most recent LFTs, January 30, protein 7.6, albumin 3.5, bilirubin 0.6, alkaline phosphatase 176, AST 38, ALT 59. At discharge, white blood cells 8.1, hemoglobin 15.3, platelets 187,000, MCV 95. B12 471.  ANA negative.  Protein  immunoglobulins including IgG, IgA, and IgM normal.   DISCHARGE MEDICATIONS: 1.  Capsaicin topical 0.025% cream, apply to affected area 3 times a day.  2.  Gabapentin 100 mg 3 capsules 3 times a day.  3.  Lantus 100 units per mL, 75 units subcutaneously once a day at bedtime.  4.  Creon 84,000 units 3 times a day with meals.  5.  Lidocaine topical 5% film, apply to affected area once a day.  6.  Tylenol 500 mg 1 tablet 3 times a day as needed for pain.  7.  Clopidogrel 75 mg 1 tablet once a day.  8.  Amlodipine 5 mg 1 tablet once a day.  9.  Losartan 100 mg 1 tablet once a day.  10.  Aspirin 81 mg 1 tablet once a day.  11.  Nitrostat 0.4 mg 1 tablet every 5 minutes as needed for chest pain. May repeat up to 3 times as needed.  12.  Colace 100 mg 1 capsule 2 times a day.  13.  Quetiapine 50 mg 1 tablet once a day at bedtime.  14.  Metoprolol succinate 25 mg 1 tablet once a day.  15.  Furosemide 40 mg 1 tablet once a day.  16.  K-Tab 10 mEq 1 tablet by mouth for each furosemide tablet taken twice a day.  17.  Melatonin 3 mg 2 tablets once a day at bedtime.  18.  Cyclobenzaprine 5 mg 1 tablet 3 times a day as needed for muscle spasm.  19.  Humalog 100 units  per mL, 6 units subcutaneously 3 times a day with meals, plus sliding scale as directed.   CONDITION ON DISCHARGE: Stable.   DISPOSITION: Discharged to home with no further home health needs.   DISCHARGE INSTRUCTIONS:  DIET: Heart healthy, carbohydrate modified diet.   ACTIVITY: No restrictions.   FOLLOWUP: The patient will follow up with Dr. Astrid Divine on February 7 and Dr. Nehemiah Massed on February 12. She will also need to follow up with gastroenterology, Dr. Candace Cruise, in the next 2-4 weeks.   TIME SPENT ON DISCHARGE: 45 minutes.    ____________________________ Earleen Newport. Volanda Napoleon, MD cpw:LT D: 09/25/2014 20:44:53 ET T: 09/25/2014 22:00:04 ET JOB#: [id number]  cc: Earleen Newport. Volanda Napoleon, MD, <Dictator> Floria Raveling. Astrid Divine, MD  Aldean Jewett MD ELECTRONICALLY SIGNED 10/02/2014 18:26

## 2014-12-22 NOTE — H&P (Signed)
PATIENT NAME:  Maurice Patterson, Maurice Patterson MR#:  [id number] DATE OF BIRTH:  [birth year]  DATE OF ADMISSION:  12/15/2014   REFERRING PHYSICIAN: Dr. Corky Downs.  PRIMARY CARE PHYSICIAN: Floria Raveling. Astrid Divine, M.D.   ADMIT DIAGNOSIS: Delirium and intermittent tachycardia.   HISTORY OF PRESENT ILLNESS: This is a 59 year old, Caucasian male, who presents to the Emergency Department with multiple complaints. The most concerning to his family being that he was delirious and incoherent. 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 On a car ride out with the family, the patient requested to stop in a parking lot to get out and stretch his legs, at which point, he doubled over in pain stating that his stomach was hurting. The patient was crying, and once he got back into the car, he appeared acutely confused, which prompted his family to bring him to the Emergency Department. Upon arrival, the patient had a heart rate in the 130s, and was clearly not speaking coherently, slurring his words and being intermittently somnolent. This prompted the Emergency Department staff to call for admission; however, the patient had not undergone a CT of the head, at that time, and by the arrival of the admitting physician, the patient appeared completely normal and denied any sort of pain. In fact, his vital signs were normal again, as well. However, due to the patient's family's insistence on his waxing and waning mental status, as well as his history of heart disease and potential new diagnosis of a pancreatic mass, the admitting physician agreed to monitor the patient overnight.   REVIEW OF SYSTEMS:    CONSTITUTIONAL: The patient denies fever or weakness.  EYES: Denies blurred vision or inflammation.  ENT: Denies tinnitus or sore throat.  RESPIRATORY: Denies cough or shortness of breath.  CARDIOVASCULAR: Denies chest pain, palpitations, orthopnea, paroxysmal  nocturnal dyspnea.  GASTROINTESTINAL: Denies nausea, vomiting, or diarrhea. The patient admitted to abdominal pain earlier, but states that he feels fine now.  ENDOCRINE: Denies polyuria, or polydipsia. HEMATOLOGIC AND LYMPHATIC: Denies easy bruising or bleeding.  INTEGUMENTARY: Denies rashes or lesions.  MUSCULOSKELETAL: Denies arthralgias or myalgias.  NEUROLOGIC: Denies numbness in his extremities or dysarthria.  PSYCHIATRIC: Denies depression or suicidal ideation.   PAST MEDICAL HISTORY: Coronary artery disease status post myocardial infarction x 2, hypertension, diabetes mellitus type 2, chronic back pain, history of CVA, history of bowel obstruction, history of GI bleed on Brilinta.   PAST SURGICAL HISTORY: Coronary stent placement x 2, ORIF  and fusion of the right wrist.   SOCIAL HISTORY: The patient has quit smoking. He had an 8-pack-year history of smoking. He denies drug use or alcohol.  FAMILY HISTORY: The patient's mother and father both had diabetes. His brother reportedly is deceased of ALS. He has a brother with coronary artery disease.   MEDICATIONS:  1. Aspirin 81 mg 1 tablet p.o. daily.  2. Clopidogrel 75 mg 1 tablet p.o. daily.  3. Cyclobenzaprine 5 mg 1 tablet p.o. t.i.d. as needed for muscle spasms.  4. Furosemide 40 mg 1 tablet p.o. daily.  5. Gabapentin 100 mg 3 capsules p.o. p.o. t.i.d.  6. Humalog 6 units subcutaneously 3 times a day with meals plus sliding scale. 7. K-Tab 10 mEq extended release 1 tablet p.o. daily taken with furosemide.  8. Lantus 60 units subcutaneously at bedtime.  9. Metoprolol succinate ER 25 mg extended release 1 tablet p.o. daily.  10. Nitrostat 0.4 mg 1 tablet  sublingually every 5 minutes as needed for chest pain. 11. Seroquel 100 mg 1 tablet p.o. at bedtime, as well as Seroquel 50 mg 1 tablet p.o. at bedtime.  12. Tylenol 500 mg 3 tablets p.o. daily as needed for pain.   ALLERGIES: AMBIEN, BRILINTA, CODEINE, LORAZEPAM AND TRAZODONE.    PERTINENT LABORATORY RESULTS AND RADIOGRAPHIC FINDINGS: Serum glucose 381, BUN 12, creatinine 0.85, serum sodium 136, potassium 3, chloride is 103, bicarbonate is 24, calcium 8.6, serum albumin is 4.1, alkaline phosphatase 269, AST is 47, ALT 60.   Troponin is negative.   Urinalysis is negative for infection.   CT of the head without contrast shows no acute intracranial pathology. There is mild cortical volume loss and scattered small vessel ischemic microangiopathy. There is mild mucosal thickening of the right maxillary sinus, as well.   Chest x-ray shows no active disease.   PHYSICAL EXAMINATION:  VITAL SIGNS: Temperature is 98.1, pulse 99, respirations 20, blood pressure 146/82, pulse oximetry is 96% on room air.  GENERAL: The patient is alert and oriented x 3, in no apparent distress at this time.  HEENT: Normocephalic, atraumatic. Pupils equal, round, and reactive to light and accommodation. Extraocular movements are intact. Mucous membranes are moist.  NECK: Trachea is midline. No adenopathy. Thyroid is nonpalpable and nontender.  CHEST: Symmetric and atraumatic.  CARDIOVASCULAR: Regular rate and rhythm. Normal S1, S2. No rubs, clicks, or murmurs appreciated.  LUNGS: Clear to auscultation bilaterally. Normal effort and excursion.  ABDOMEN: Positive bowel sounds. Soft, mildly tender around the umbilicus, but without guarding or rebound. There is no hepatosplenomegaly.  GENITOURINARY: Deferred.  MUSCULOSKELETAL: The patient moves all 4 extremities equally. There is 5/5 strength in upper and lower extremities, and his gait is normal.  SKIN: Warm and dry. The patient does have an erythematous papular rash in a shawl distribution, as well as in his left antecubital fossa, which his family states is new since he has been working in the yard today.  EXTREMITIES: No clubbing, cyanosis, but the patient does have 1+ edema to the shins.  NEUROLOGIC: Cranial nerves II through XII are grossly  intact, although the patient clearly has some weakness of his hypoglossal nerve as he occasionally has trouble catching his oral secretions in time.  PSYCHIATRIC: Mood is normal. Affect is congruent. The patient has fairly poor judgment and insight into his medical condition.   ASSESSMENT AND PLAN: This is a 59 year old male admitted for delirium and intermittent tachycardia.   1. Delirium is currently resolved. The patient's wife states that his delirium comes and goes. We will monitor for signs or symptoms of dementia or stroke. I will also check for reversible causes of delirium. The patient's head CT shows no masses, which is important as he apparently has a report of a pancreatic mass, although pancreatic cancer is unlikely to metastasize to the brain. He also does not have a new stroke. Notably he has had previous strokes. Differential diagnosis to his delirium includes paraneoplastic delirium, if indeed he does have a malignancy or sequela of his old stroke.  2. Tachycardia. The patient's heart rate was originally around 120 to 130. Now, it is within normal limits. We will monitor the patient on telemetry and continue metoprolol per his home regimen.  3. Pancreatic mass. The patient had abdominal pain prior to arrival, but this is now resolved, as well. He reportedly underwent an MRI of the abdomen to visualize the pancreas at [facility]. The CT here shows a calcified,  pancreatic cyst, but his wife informs me that he has an infiltrative mass, although this is also in doubt as the patient has not undergone a PET scan, and also has a history of chronic pancreatitis.  4. Coronary artery disease. This is stable at the time. The patient is not complaining of chest pain or shortness of breath. We will continue Plavix, aspirin and furosemide, although the use of this last medication is unclear if it is for venous insufficiency or ischemic cardiomyopathy.  5. Deep vein thrombosis  prophylaxis: Subcutaneous heparin.  6. Gastrointestinal prophylaxis: H2-blocker as needed.   CODE STATUS: The patient is a FULL CODE.   TIME SPENT ON ADMISSION ORDERS AND PATIENT CARE: Approximately 35 minutes.    ____________________________ Norva Riffle. Marcille Blanco, MD msd:JT D: 12/15/2014 07:27:28 ET T: 12/15/2014 08:11:07 ET JOB#: [id number]  cc: Norva Riffle. Marcille Blanco, MD, <Dictator> Norva Riffle Mccauley Diehl MD ELECTRONICALLY SIGNED 12/17/2014 0:20

## 2014-12-22 NOTE — Consult Note (Signed)
PATIENT NAME:  Maurice Patterson, Maurice Patterson MR#:  [id number] DATE OF BIRTH:  01/21/1956  DATE OF CONSULTATION:  09/21/2014  REFERRING PHYSICIAN:   CONSULTING PHYSICIAN:  Leotis Pain, MD  REASON FOR CONSULTATION: Altered mental status.  HISTORY OF PRESENT ILLNESS: A 59 year old Caucasian male admitted on 09/18/2014 with abdominal pain, back pain, and diaphoresis, status post MRCP.Marland Kitchen Post MRCP, the patient received Ambien. Since that time, the patient has been delirious. His mental status has been worsening and fluctuating with agitation and periods of confusion. The case discussed with the patient's family at bedside, who state that the patient has not slept in  days and gets confused on location, on the names of his family members, and at times, gets very agitated.   PAST MEDICAL HISTORY: Hypertension, diabetes type 2, coronary artery disease, status post myocardial infarction, status post stent placements x 2.   SOCIAL HISTORY: The patient is a former smoker. He smoked 1-1/2 packs per day.   FAMILY HISTORY: Mother and father died of diabetes.   MEDICATIONS: Have been reviewed.   ALLERGIES: AMBIEN AND BRILINTA.   NEUROLOGICAL EVALUATION: The patient is able to tell me date, time, but could not tell me the reason why he is in the hospital. Extraocular movements intact. Facial sensation intact. Facial motor is intact. Tongue is in midline. Uvula elevates symmetrically. Shoulder shrug is intact. Motor strength is 5/5 bilateral upper and lower extremities. The patient is able to ambulate by himself with limited to no assistance.   ASSESSMENT: The patient is a 59 year old Caucasian male presenting to the Emergency Department with abdominal pain, status post endoscopic retrograde cholangiopancreatography. Now has active hypo-hyperactive to hyperactive delirium.   PLAN: His mental status changes are due to likely poor sleep to lack of sleep. I would increase his Seroquel with trial of 25 mg at night and if  it help, would probably give him 50 mg nightly Leave the blinds open during the day to have him stay awake. Hydration. Family is asking MRI. I am in agreement with MRI with and without contrast. Once delirium resolves, discharge planning. Thank you. It was a pleasure seeing this patient.    ____________________________ Leotis Pain, MD yz:mw D: 09/21/2014 12:15:44 ET T: 09/21/2014 14:52:35 ET JOB#: [id number]  cc: Leotis Pain, MD, <Dictator> Leotis Pain MD ELECTRONICALLY SIGNED 10/01/2014 12:07

## 2014-12-22 NOTE — Consult Note (Signed)
PATIENT NAME:  Maurice Patterson, Maurice Patterson MR#:  [id number] DATE OF BIRTH:  1956-07-11  DATE OF CONSULTATION:  09/18/2014  REFERRING PHYSICIAN:  Norva Riffle. Marcille Blanco, MD CONSULTING PHYSICIAN:  Lupita Dawn. Candace Cruise, MD; Janalyn Harder. Jerelene Redden, ANP (Adult Nurse Practitioner)   REASON FOR CONSULTATION: Pancreatitis.   HISTORY OF PRESENT ILLNESS: This 59 year old patient with complex coronary artery history, diabetes since age 22, hypertension, CVA, and recurrent pancreatitis with fatty liver presented to the hospital with acute episode of watery diarrhea and abdominal pain. The patient presented with some confusion and was initially a poor historian. This interview was done with the patient and his wife. The patient defers to his wife for detailed history.   She reports that he has had diabetes since he was 59 years old and has been diet controlled until the year 2012. In 2012 he had an MI in April and in December. He did have 2 stents placed. In January 2013, he had what appeared to be a bowel obstruction and he was in and out of the hospital. The diagnosis was pancreatitis.  He developed cardiac problems and was placed on Brilinta. He subsequently sustained a stroke in 2013 and was taken off Brilinta and placed on some other type of  medication. During this timeframe in 2013, he was found to have a very elevated cholesterol panel with triglycerides. The exact numbers are not known. He was given a statin drug. He has been taking this medication, simvastatin, for several years now.  In July 2015, he developed a problem with diarrhea and had a GI consult at [facility] in October. He was having adjustments to his diabetic medication at that time. No prior history of colonoscopy and with his heart history and Plavix, the decision was made to rule out infectious cause, and pancreatic insufficiency. There was discussion regarding a breath test and trial of Imodium. Stool studies apparently were negative.  He was also seen by Dr. Lincoln Brigham  in September 2015 for elevated liver enzymes thought to be secondary to fatty liver. He was diagnosed with abnormal liver enzymes in 2013, when he was in the hospital for 7 months with MI and stroke problems. The patient has not had his Twinrix vaccine. He has been advised to lose weight and manage fatty liver. No recommendation for liver biopsy. No evidence of chronic viral, metabolic, autoimmune, or drug-induced hepatitis. No history of alcoholism.   The patient reports since 07/23/2014, he has been taking Creon 6 tablets daily 2 with each meal. He does not eat snacks. Creon causes a regular bowel movement 2 times a day. He has skipped some doses. Yesterday, he developed abdominal pain and watery diarrhea with yellow loose stools, oily droplets, about 10 times. He has had diarrhea at night as well. He was brought into the hospital for further evaluation and management.   The CT of the abdomen on admission did show diffuse fatty infiltration of the liver likely representing fatty infiltration. There were pancreatic calcifications consistent with chronic pancreatitis. Pancreatic ductal dilatation is present now, and not seen on prior study. Recommendation for consideration of MRI to exclude underlying occult mass or mucinous tumor. No inflammation around the pancreas to suggest acute pancreatitis. Admission lipase was normal.  GI is asked to see the patient regarding further evaluation and management.   PAST MEDICAL HISTORY: 1.  Coronary artery disease with an MI 11/29/2010, 07/31/2011, with 2 stents placed. The patient has prolonged hospital admission. 2.  CVA, January 2013.  3.  Diabetes mellitus  diagnosed at age 63 years old and I am told he has been diet controlled until 2012.  4.  Hypertension.  5.  Hyperlipidemia.  6.  Fatty liver.  7.  Chronic pancreatitis 2013.  PAST SURGICAL HISTORY:   1.  Cardiac catheterizations, most recent approximately 2014.  2.  Left rotator cuff shoulder repair.  3.   Right wrist fusion with plate 30 years ago.   MEDICATIONS ON ADMISSION: The patient refers to his wife. From memory, she notes:  1.  Creon 6 tablets daily.  2.  Tylenol, acetaminophen, 1 gram 3 times daily.  3.  Gabapentin 600 mg twice daily for back pain.  4.  Capsaicin 0.025% topical cream for back pain.  5.  He was on a Lidoderm patch for back pain.  6.  Metoprolol ER 25 mg daily.  7.  Furosemide.  8.  Aspirin 81 mg daily. 9.  He came off warfarin December 2015.  10.  Amlodipine 5 mg daily.  11.  Plavix 75 mg once daily.  12.  Colace 100 mg once daily as needed.  13.  K-Tab 10 mEq daily.  14.  Lantus   15.  Humalog 18 units 3 times daily. 16.  Losartan 100 mg 1 tablet daily.  17.  Nitroglycerin 0.4 mg sublingual tablets as needed.   ALLERGIES:  BRILINTA CAUSED SWELLING. AMBIEN CAUSED CONFUSION, CODEINE CAUSED SWELLING.  HABITS: Negative tobacco, quit 2012, previous tobacco user. Negative alcohol or drugs.   FAMILY HISTORY: Positive for cardiovascular disease, hypertension, and negative for GI malignancy.   SOCIAL HISTORY: The patient is married, on medical disability.   REVIEW OF SYSTEMS: Twelve systems reviewed, positive for weakness, positive for poor memory and episodes of confusion since his stroke. Positive for chronic back pain. He did develop pain yesterday at 4:00 p.m. sharp, rated 8-10, radiation into the back associated with nausea, 2-3 episodes of vomiting. There was no fresh blood or melena. He was having a lot of diarrhea every 15 minutes, greasy, yellow. He presented to the Emergency Room. Ultrasound showed fatty liver, gallbladder ducts within normal. CT showed steatohepatitis, and abnormal pancreatic duct which is new finding. The patient reports he is having still some discomfort, rate is at 6/10 now. He has had no further vomiting. Last diarrhea was about 0400. He denies antibiotic exposure.  He denies change in medication or travel or ill contacts. He denies blood  or melena in the stool. No prior history of colonoscopy or EGD. He denies chest pain, pressure, heaviness, tightness. He is ambulating to the bathroom, a little bit unsteady. He needs assistance. He has chronic back pain and is not able to lay in bed for long periods of time. Interview with the patient standing up in the room. Remaining review of systems otherwise negative.   PHYSICAL EXAMINATION: VITAL SIGNS: 98.2, 102, 18, 115/73, pulse oximetry on room air is 96%.  GENERAL: Disheveled, Caucasian male, overweight. In and out of bed as noted.  HEENT: Head is normocephalic. Conjunctivae pink. Sclerae anicteric. Oral mucosa is dry and intact. Healing aphthous lip ulcer noted.  NECK: Supple. Trachea is midline.  CARDIAC: S1 and S2 without murmur.  LUNGS: CTA. Respirations are nonlabored.  ABDOMEN: Soft, positive diastasis recti separation. Mild bilateral lower abdominal tenderness with palpation, nonspecific. Cannot appreciate hepatosplenomegaly or masses.  RECTAL: Exam was deferred.  EXTREMITIES: Lower extremities with some venous stasis changes, slight edema.  NEUROLOGIC: Cranial nerves grossly intact, vague and wandering historian without able to give coherent detailed  medical history. Very pleasant, answers all questions. Speech is clear, looks at speaker. He is oriented to person, place, and time. Interacting with family with conversation appropriately. Affect and mood are within normal.   LABORATORY DATA: Admission labs with glucose 166, BUN 14, creatinine 1.06. Electrolytes are unremarkable. Lipase is 21, albumin 4.0, alkaline phosphatase 228, AST 59, ALT 111, troponin negative, less than 0.02 x 3. WBC 18.4, hemoglobin 18.0.   Repeat laboratory studies today with ammonia 24, hemoglobin A1c 9.8.   RADIOLOGY:  1.  Abdominal ultrasound showed normal gallbladder without stones or wall thickening. Common bile duct 5.3 mm, normal. Liver, diffusely increased parenchymal echotexture suggesting mild  diffuse fatty infiltration. No focal liver lesions seen. 2.  CT of the abdomen and pelvis with contrast performed early this morning with diffuse fatty infiltration of the liver in the right lobe of the liver likely representing heterogeneous fatty infiltration. Pancreatic calcifications consistent with chronic pancreatitis. Pancreatic duct dilatation in the head and body region. No discrete mass is identified. No infiltration around the pancreas to suggest acute pancreatitis. The appendix is normal. Prostate gland is not enlarged. The bowel and bladder are unremarkable. There are degenerative changes of the spine and scoliosis.   IMPRESSION:  1.  The patient comes in with epigastric pain that radiates into the back, and diarrhea. He has been seen by cardiology with negative cardiac intervention required at this time. The patient denies history of gastrointestinal bleed. He denies history of esophagogastroduodenoscopy, colonoscopy. There is documentation for chronic pancreatitis and previous calcifications on prior CT studies, but he has not had findings of pancreatic ductal dilatation in the past. This is new and will require further evaluation.  2.  Fatty liver is known.  3.  The patient reports history of elevated lipids. We will check lipid panel. Etiology of his chronic pancreatitis is unclear. We will rule out autoimmune pancreatitis with IgG 4, ANA.  4.  Obtain MRCP to further evaluate the pancreatic duct findings.  5.  Because of the diarrhea, acute on chronic, will consider stool for Clostridium difficile comprehensive culture, fecal fat, pancreatic elastase. 6.  Continue with Creon when he goes on his diet as that clearly did make improvement in that chronic problem.   Further GI recommendations pending clinical course and findings. This case was discussed with Dr. Verdie Shire. These services provided by Joelene Millin A. Jerelene Redden, ANP in collaborative agreement with Lupita Dawn. Candace Cruise,  MD.   ____________________________ Janalyn Harder Jerelene Redden, ANP (Adult Nurse Practitioner) kam:LT D: 09/18/2014 17:41:09 ET T: 09/18/2014 18:48:30 ET JOB#: [id number]  cc: Joelene Millin A. Jerelene Redden, ANP (Adult Nurse Practitioner), <Dictator> Janalyn Harder Sherlyn Hay, MSN, ANP-BC Adult Nurse Practitioner ELECTRONICALLY SIGNED 09/19/2014 18:04

## 2014-12-22 NOTE — Consult Note (Signed)
Chief Complaint:  Subjective/Chief Complaint Had to stop MRI due to patient not being able to stay still. Given ativan beforehand. Now, confused and lethargic. Agitated last night. Normal tylenol level. Normal IgG level. Neg for c.diff. Other stool tests still pending. Lipid panel pending.  According to wife, patient quite functioning at home.   VITAL SIGNS/ANCILLARY NOTES: **Vital Signs.:   28-Jan-16 11:53  Vital Signs Type Routine  Temperature Temperature (F) 97.6  Celsius 36.4  Temperature Source oral  Pulse Pulse 107  Respirations Respirations 18  Systolic BP Systolic BP 834  Diastolic BP (mmHg) Diastolic BP (mmHg) 84  Mean BP 100  Pulse Ox % Pulse Ox % 95  Pulse Ox Activity Level  At rest  Oxygen Delivery Room Air/ 21 %   Brief Assessment:  GEN no acute distress   Cardiac Regular   Respiratory clear BS   Gastrointestinal Normal   Lab Results:  Thyroid:  28-Jan-16 04:07   Thyroid Stimulating Hormone 1.70 (0.45-4.50 (IU = International Unit)  ----------------------- Pregnant patients have  different reference  ranges for TSH:  - - - - - - - - - -  Pregnant, first trimetser:  0.36 - 2.50 uIU/mL)  General Ref:  27-Jan-16 17:28   Immunoglobulins, Qt, IgA, IgG, IgM, Serum ========== TEST NAME ==========  ========= RESULTS =========  = REFERENCE RANGE =  PROT IMMUNOGLOBULINS  Immunoglobulins A/G/M, Qn, Ser Immunoglobulin G, Qn, Serum     [   1216 mg/dL           ]          [phone] Immunoglobulin A, Qn, Serum[   229 mg/dL            ]            91-414 Immunoglobulin M, Qn, Serum     [   119 mg/dL            ]            [address]               [facility]            No: [id number]           [address], [place], [place] [address] Lindon Romp, MD         [phone]   Result(s) reported on 19 Sep 2014 at 09:19AM.  Routine Chem:  28-Jan-16 04:07   Cholesterol, Serum 73  Triglycerides, Serum 98  HDL (INHOUSE)  23  VLDL Cholesterol  Calculated 20  LDL Cholesterol Calculated 30 (Result(s) reported on 19 Sep 2014 at 05:12AM.)   Assessment/Plan:  Assessment/Plan:  Assessment Chronic pancreatitis. More issue is his mental status. Appears extremely sensitive to sedative agents. Yet, no sleep in few days.   Plan Advance to low fat diet and see how he does. Make sure patient is back on creon with every meal. If MRCP proves to be an incomplete study, then will arrange EUS in 2 weeks here. Family does not want to go to [place]: Verdie Shire (MD)  (Signed 28-Jan-16 13:00)  Authored: Chief Complaint, VITAL SIGNS/ANCILLARY NOTES, Brief Assessment, Lab Results, Assessment/Plan   Last Updated: 28-Jan-16 13:00 by Verdie Shire (MD)

## 2015-02-19 ENCOUNTER — Emergency Department: Payer: Medicare Other

## 2015-02-19 ENCOUNTER — Other Ambulatory Visit: Payer: Self-pay

## 2015-02-19 ENCOUNTER — Encounter: Payer: Self-pay | Admitting: Emergency Medicine

## 2015-02-19 ENCOUNTER — Observation Stay
Admission: EM | Admit: 2015-02-19 | Discharge: 2015-02-20 | Disposition: A | Discharge status: Home / Self Care | Payer: Medicare Other | Attending: Internal Medicine | Admitting: Internal Medicine

## 2015-02-19 ENCOUNTER — Observation Stay: Payer: Medicare Other

## 2015-02-19 DIAGNOSIS — C7949 Secondary malignant neoplasm of other parts of nervous system: Secondary | ICD-10-CM

## 2015-02-19 DIAGNOSIS — Z8249 Family history of ischemic heart disease and other diseases of the circulatory system: Secondary | ICD-10-CM | POA: Diagnosis not present

## 2015-02-19 DIAGNOSIS — I252 Old myocardial infarction: Secondary | ICD-10-CM | POA: Diagnosis not present

## 2015-02-19 DIAGNOSIS — R079 Chest pain, unspecified: Secondary | ICD-10-CM | POA: Diagnosis not present

## 2015-02-19 DIAGNOSIS — R0602 Shortness of breath: Secondary | ICD-10-CM | POA: Insufficient documentation

## 2015-02-19 DIAGNOSIS — Z888 Allergy status to other drugs, medicaments and biological substances status: Secondary | ICD-10-CM | POA: Insufficient documentation

## 2015-02-19 DIAGNOSIS — R4182 Altered mental status, unspecified: Secondary | ICD-10-CM | POA: Diagnosis not present

## 2015-02-19 DIAGNOSIS — R4781 Slurred speech: Secondary | ICD-10-CM | POA: Insufficient documentation

## 2015-02-19 DIAGNOSIS — R410 Disorientation, unspecified: Secondary | ICD-10-CM | POA: Diagnosis present

## 2015-02-19 DIAGNOSIS — Z79899 Other long term (current) drug therapy: Secondary | ICD-10-CM | POA: Insufficient documentation

## 2015-02-19 DIAGNOSIS — R404 Transient alteration of awareness: Secondary | ICD-10-CM | POA: Diagnosis not present

## 2015-02-19 DIAGNOSIS — K861 Other chronic pancreatitis: Secondary | ICD-10-CM | POA: Diagnosis not present

## 2015-02-19 DIAGNOSIS — Z833 Family history of diabetes mellitus: Secondary | ICD-10-CM | POA: Insufficient documentation

## 2015-02-19 DIAGNOSIS — Z885 Allergy status to narcotic agent status: Secondary | ICD-10-CM | POA: Insufficient documentation

## 2015-02-19 DIAGNOSIS — C801 Malignant (primary) neoplasm, unspecified: Secondary | ICD-10-CM

## 2015-02-19 DIAGNOSIS — R51 Headache: Secondary | ICD-10-CM | POA: Insufficient documentation

## 2015-02-19 DIAGNOSIS — Z8673 Personal history of transient ischemic attack (TIA), and cerebral infarction without residual deficits: Secondary | ICD-10-CM | POA: Insufficient documentation

## 2015-02-19 DIAGNOSIS — G40909 Epilepsy, unspecified, not intractable, without status epilepticus: Secondary | ICD-10-CM | POA: Insufficient documentation

## 2015-02-19 DIAGNOSIS — Z794 Long term (current) use of insulin: Secondary | ICD-10-CM | POA: Insufficient documentation

## 2015-02-19 DIAGNOSIS — E119 Type 2 diabetes mellitus without complications: Secondary | ICD-10-CM | POA: Insufficient documentation

## 2015-02-19 DIAGNOSIS — I2510 Atherosclerotic heart disease of native coronary artery without angina pectoris: Secondary | ICD-10-CM | POA: Insufficient documentation

## 2015-02-19 DIAGNOSIS — Z87891 Personal history of nicotine dependence: Secondary | ICD-10-CM | POA: Insufficient documentation

## 2015-02-19 DIAGNOSIS — R531 Weakness: Secondary | ICD-10-CM | POA: Diagnosis not present

## 2015-02-19 DIAGNOSIS — I10 Essential (primary) hypertension: Secondary | ICD-10-CM | POA: Diagnosis not present

## 2015-02-19 DIAGNOSIS — Z880 Allergy status to penicillin: Secondary | ICD-10-CM | POA: Diagnosis not present

## 2015-02-19 HISTORY — DX: Cerebral infarction, unspecified: I63.9

## 2015-02-19 HISTORY — DX: Other intervertebral disc degeneration, lumbar region: M51.36

## 2015-02-19 HISTORY — DX: Unspecified abdominal hernia without obstruction or gangrene: K46.9

## 2015-02-19 HISTORY — DX: Type 2 diabetes mellitus without complications: E11.9

## 2015-02-19 HISTORY — DX: Other intervertebral disc degeneration, lumbar region without mention of lumbar back pain or lower extremity pain: M51.369

## 2015-02-19 HISTORY — DX: Other amnesia: R41.3

## 2015-02-19 HISTORY — DX: Unspecified convulsions: R56.9

## 2015-02-19 HISTORY — DX: Essential (primary) hypertension: I10

## 2015-02-19 HISTORY — DX: Malignant neoplasm of pancreas, unspecified: C25.9

## 2015-02-19 HISTORY — DX: Acute myocardial infarction, unspecified: I21.9

## 2015-02-19 HISTORY — DX: Unspecified osteoarthritis, unspecified site: M19.90

## 2015-02-19 LAB — COMPREHENSIVE METABOLIC PANEL
ALBUMIN: 4.2 g/dL (ref 3.5–5.0)
ALT: 35 U/L (ref 17–63)
AST: 30 U/L (ref 15–41)
Alkaline Phosphatase: 228 U/L — ABNORMAL HIGH (ref 38–126)
Anion gap: 10 (ref 5–15)
BUN: 14 mg/dL (ref 6–20)
CALCIUM: 9.9 mg/dL (ref 8.9–10.3)
CO2: 24 mmol/L (ref 22–32)
CREATININE: 0.77 mg/dL (ref 0.61–1.24)
Chloride: 106 mmol/L (ref 101–111)
GFR calc Af Amer: >60 mL/min (ref >60)
Glucose, Bld: 115 mg/dL — ABNORMAL HIGH (ref 65–99)
Potassium: 3.8 mmol/L (ref 3.5–5.1)
SODIUM: 140 mmol/L (ref 135–145)
Total Bilirubin: 0.6 mg/dL (ref 0.3–1.2)
Total Protein: 8.1 g/dL (ref 6.5–8.1)

## 2015-02-19 LAB — CBC WITH DIFFERENTIAL/PLATELET
BASOS PCT: 2 %
Basophils Absolute: 0.2 10*3/uL — ABNORMAL HIGH (ref 0–0.1)
EOS ABS: 0.1 10*3/uL (ref 0–0.7)
Eosinophils Relative: 1 %
HEMATOCRIT: 48 % (ref 40.0–52.0)
HEMOGLOBIN: 16.5 g/dL (ref 13.0–18.0)
LYMPHS ABS: 2.1 10*3/uL (ref 1.0–3.6)
Lymphocytes Relative: 23 %
MCH: 31.7 pg (ref 26.0–34.0)
MCHC: 34.3 g/dL (ref 32.0–36.0)
MCV: 92.4 fL (ref 80.0–100.0)
MONOS PCT: 9 %
Monocytes Absolute: 0.8 10*3/uL (ref 0.2–1.0)
Neutro Abs: 6.2 10*3/uL (ref 1.4–6.5)
Neutrophils Relative %: 67 %
Platelets: 207 10*3/uL (ref 150–440)
RBC: 5.19 MIL/uL (ref 4.40–5.90)
RDW: 13.7 % (ref 11.5–14.5)
WBC: 9.3 10*3/uL (ref 3.8–10.6)

## 2015-02-19 LAB — AMMONIA: AMMONIA: 25 umol/L (ref 9–35)

## 2015-02-19 LAB — GLUCOSE, CAPILLARY
GLUCOSE-CAPILLARY: 105 mg/dL — AB (ref 65–99)
GLUCOSE-CAPILLARY: 159 mg/dL — AB (ref 65–99)
GLUCOSE-CAPILLARY: 357 mg/dL — AB (ref 65–99)

## 2015-02-19 LAB — LIPASE, BLOOD: LIPASE: 19 U/L — AB (ref 22–51)

## 2015-02-19 LAB — TROPONIN I: Troponin I: <0.03 ng/mL (ref <0.031)

## 2015-02-19 LAB — TSH: TSH: 2.936 u[IU]/mL (ref 0.350–4.500)

## 2015-02-19 MED ORDER — ONDANSETRON HCL 4 MG/2ML IJ SOLN: 4.0000 mg | Freq: Four times a day (QID) | INTRAMUSCULAR | Status: DC | PRN

## 2015-02-19 MED ORDER — ENOXAPARIN SODIUM 40 MG/0.4ML ~~LOC~~ SOLN
40.0000 mg | SUBCUTANEOUS | Status: DC
  Administered 2015-02-19: 20:00:00 40 mg via SUBCUTANEOUS
  Filled 2015-02-19: qty 0.4

## 2015-02-19 MED ORDER — GABAPENTIN 300 MG PO CAPS
ORAL_CAPSULE | ORAL | Status: AC
  Filled 2015-02-19: qty 1

## 2015-02-19 MED ORDER — CLOPIDOGREL BISULFATE 75 MG PO TABS
75.0000 mg | ORAL_TABLET | Freq: Every day | ORAL | Status: DC
  Administered 2015-02-20: 75 mg via ORAL
  Filled 2015-02-19: qty 1

## 2015-02-19 MED ORDER — ACETAMINOPHEN 325 MG PO TABS: 650.0000 mg | ORAL_TABLET | Freq: Four times a day (QID) | ORAL | Status: DC | PRN

## 2015-02-19 MED ORDER — LAMOTRIGINE 25 MG PO TABS
50.0000 mg | ORAL_TABLET | Freq: Every day | ORAL | Status: DC
Start: 2015-02-19 — End: 2015-02-20
  Administered 2015-02-19: 20:00:00 50 mg via ORAL

## 2015-02-19 MED ORDER — ASPIRIN 81 MG PO CHEW
CHEWABLE_TABLET | ORAL | Status: AC
  Filled 2015-02-19: qty 3

## 2015-02-19 MED ORDER — NITROGLYCERIN 0.4 MG SL SUBL: 0.4000 mg | SUBLINGUAL_TABLET | SUBLINGUAL | Status: DC | PRN

## 2015-02-19 MED ORDER — CYCLOBENZAPRINE HCL 10 MG PO TABS
10.0000 mg | ORAL_TABLET | Freq: Three times a day (TID) | ORAL | Status: DC | PRN
  Administered 2015-02-20: 09:00:00 10 mg via ORAL
  Filled 2015-02-19: qty 1

## 2015-02-19 MED ORDER — LAMOTRIGINE 25 MG PO TABS
25.0000 mg | ORAL_TABLET | Freq: Every day | ORAL | Status: DC
  Administered 2015-02-20: 25 mg via ORAL
  Filled 2015-02-19 (×3): qty 1

## 2015-02-19 MED ORDER — INSULIN ASPART 100 UNIT/ML ~~LOC~~ SOLN
0.0000 [IU] | Freq: Three times a day (TID) | SUBCUTANEOUS | Status: DC
Start: 2015-02-20 — End: 2015-02-20
  Administered 2015-02-20: 8 [IU] via SUBCUTANEOUS
  Administered 2015-02-20: 18:00:00 3 [IU] via SUBCUTANEOUS
  Administered 2015-02-20: 09:00:00 15 [IU] via SUBCUTANEOUS
  Filled 2015-02-19: qty 8
  Filled 2015-02-19: qty 15
  Filled 2015-02-19: qty 3

## 2015-02-19 MED ORDER — FOLIC ACID 1 MG PO TABS
1.0000 mg | ORAL_TABLET | Freq: Every day | ORAL | Status: DC
  Administered 2015-02-19 – 2015-02-20 (×2): 1 mg via ORAL
  Filled 2015-02-19 (×2): qty 1

## 2015-02-19 MED ORDER — ASPIRIN 81 MG PO CHEW
324.0000 mg | CHEWABLE_TABLET | Freq: Once | ORAL | Status: AC
  Administered 2015-02-19: 243 mg via ORAL

## 2015-02-19 MED ORDER — ONDANSETRON HCL 4 MG PO TABS: 4.0000 mg | ORAL_TABLET | Freq: Four times a day (QID) | ORAL | Status: DC | PRN

## 2015-02-19 MED ORDER — LOSARTAN POTASSIUM 50 MG PO TABS
100.0000 mg | ORAL_TABLET | Freq: Every day | ORAL | Status: DC
  Administered 2015-02-20: 09:00:00 100 mg via ORAL
  Filled 2015-02-19: qty 2

## 2015-02-19 MED ORDER — INSULIN LISPRO 100 UNIT/ML (KWIKPEN): 2.0000 [IU] | PEN_INJECTOR | Freq: Three times a day (TID) | SUBCUTANEOUS | Status: DC

## 2015-02-19 MED ORDER — CHOLESTYRAMINE LIGHT 4 G PO PACK
4.0000 g | PACK | Freq: Every day | ORAL | Status: DC
  Administered 2015-02-19 – 2015-02-20 (×5): 4 g via ORAL
  Filled 2015-02-19 (×10): qty 1

## 2015-02-19 MED ORDER — VITAMIN B-1 100 MG PO TABS
100.0000 mg | ORAL_TABLET | Freq: Every day | ORAL | Status: DC
  Administered 2015-02-19 – 2015-02-20 (×2): 100 mg via ORAL
  Filled 2015-02-19 (×2): qty 1

## 2015-02-19 MED ORDER — QUETIAPINE FUMARATE 25 MG PO TABS
50.0000 mg | ORAL_TABLET | Freq: Every day | ORAL | Status: DC
  Administered 2015-02-19 – 2015-02-20 (×2): 50 mg via ORAL
  Filled 2015-02-19 (×2): qty 2

## 2015-02-19 MED ORDER — POTASSIUM CHLORIDE CRYS ER 10 MEQ PO TBCR
10.0000 meq | EXTENDED_RELEASE_TABLET | Freq: Two times a day (BID) | ORAL | Status: DC
  Administered 2015-02-19 – 2015-02-20 (×2): 10 meq via ORAL
  Filled 2015-02-19 (×2): qty 1

## 2015-02-19 MED ORDER — FUROSEMIDE 20 MG PO TABS
40.0000 mg | ORAL_TABLET | Freq: Every day | ORAL | Status: DC
  Administered 2015-02-20: 40 mg via ORAL
  Filled 2015-02-19: qty 2

## 2015-02-19 MED ORDER — METOPROLOL SUCCINATE ER 25 MG PO TB24
25.0000 mg | ORAL_TABLET | Freq: Every day | ORAL | Status: DC
  Administered 2015-02-20: 25 mg via ORAL
  Filled 2015-02-19: qty 1

## 2015-02-19 MED ORDER — ACETAMINOPHEN 650 MG RE SUPP: 650.0000 mg | Freq: Four times a day (QID) | RECTAL | Status: DC | PRN

## 2015-02-19 MED ORDER — INSULIN ASPART 100 UNIT/ML ~~LOC~~ SOLN
0.0000 [IU] | Freq: Every day | SUBCUTANEOUS | Status: DC
  Administered 2015-02-19: 20:00:00 5 [IU] via SUBCUTANEOUS
  Filled 2015-02-19: qty 5

## 2015-02-19 MED ORDER — INSULIN GLARGINE 100 UNIT/ML ~~LOC~~ SOLN
75.0000 [IU] | Freq: Every day | SUBCUTANEOUS | Status: DC
  Administered 2015-02-19: 75 [IU] via SUBCUTANEOUS
  Filled 2015-02-19 (×2): qty 0.75

## 2015-02-19 MED ORDER — GABAPENTIN 300 MG PO CAPS
300.0000 mg | ORAL_CAPSULE | Freq: Once | ORAL | Status: AC
  Administered 2015-02-19: 300 mg via ORAL

## 2015-02-19 NOTE — ED Notes (Signed)
Resumed care from denia rn.  Pt alert.  primedoc in with pt now.  Family at bedside.  Iv in place.  nsr on monitor.

## 2015-02-19 NOTE — ED Notes (Signed)
Pt assisted with trying to use the urinal unable to urinate at this time. Admission MD at bedside at this time for interview. Daughter and Wife at bedside.

## 2015-02-19 NOTE — Consult Note (Signed)
CC: confusion   HPI: Maurice Patterson is an 59 y.o. male who is well known to my service ? Hx of seeizures, hypertension, diabetes, chronic pancreatitis, history of coronary disease with previous MI, who presented to the hospital due to altered mental status/confusion. Patient himself is a poor historian therefore most of the history obtained from the family at bedside. As per the family patient has been his usual state of health and his mental status has been stable now for the past few weeks. Patient has recently been diagnosed with seizures/ on EEG was found to have temporal epileptiform discharges by Dr. Melrose Nakayama  and was started on Keppra but had an adverse effect with diarrhea and therefore was taken off of it and then switched to Lamictal. Patient has been slowly titrating his Lamictal and had his first dose of 50 mg at bedtime last night. This morning when he woke up he was in his usual state but shortly thereafter he became more confused and started complaining of chest pain and shortness of breath. Pt has had 2 prior similar admission with diagnosis of hyperactive/hypoactive delirium.    Past Medical History  Diagnosis Date  . Seizures   . Memory loss   . Arthritis   . Stroke   . Hypertension   . Diabetes mellitus without complication   . Malignant intraductal papillary mucinous tumor of pancreas   . Hernia of abdominal cavity   . Degenerative lumbar disc   . Acute MI     Past Surgical History  Procedure Laterality Date  . Left arm metal plate    . Stent times 2    . Left shoulder surgery      Family History  Problem Relation Age of Onset  . Diabetes Mother   . Hypertension Mother   . Alzheimer's disease Father     Social History:  reports that he has quit smoking. He does not have any smokeless tobacco history on file. He reports that he does not drink alcohol or use illicit drugs.  Allergies  Allergen Reactions  . Hydrocodone Anaphylaxis  . Ambien [Zolpidem] Other  (See Comments)    delirium    . Brilinta [Ticagrelor] Other (See Comments)    Stroke   . Penicillins Rash    Medications: I have reviewed the patient's current medications.  ROS: Unable to obtain  Physical Examination: Blood pressure 134/82, pulse 96, temperature 98.4 F (36.9 C), temperature source Oral, resp. rate 17, height 5\' 7"  (1.702 m), weight 77.111 kg (170 lb), SpO2 100 %.   Neurological Examination Mental Status: Alert, oriented, thought content appropriate.  Speech fluent without evidence of aphasia.  Able to follow 3 step commands without difficulty. Cranial Nerves: II: Discs flat bilaterally; Visual fields grossly normal, pupils equal, round, reactive to light and accommodation III,IV, VI: ptosis not present, extra-ocular motions intact bilaterally V,VII: smile symmetric, facial light touch sensation normal bilaterally VIII: hearing normal bilaterally IX,X: gag reflex present XI: bilateral shoulder shrug XII: midline tongue extension Motor: Right : Upper extremity   4/5    Left:     Upper extremity   3/5  Lower extremity   4/5     Lower extremity   3/5 Tone and bulk:normal tone throughout; no atrophy noted Sensory: Pinprick and light touch intact throughout, bilaterally Deep Tendon Reflexes: 1+ and symmetric throughout Plantars: Right: downgoing   Left: downgoing Cerebellar: normal finger-to-nose, normal rapid alternating movements and normal heel-to-shin test Gait: not examined.  Laboratory Studies:   Basic Metabolic Panel:  Recent Labs Lab 02/19/15 1027  NA 140  K 3.8  CL 106  CO2 24  GLUCOSE 115*  BUN 14  CREATININE 0.77  CALCIUM 9.9    Liver Function Tests:  Recent Labs Lab 02/19/15 1027  AST 30  ALT 35  ALKPHOS 228*  BILITOT 0.6  PROT 8.1  ALBUMIN 4.2    Recent Labs Lab 02/19/15 1027  LIPASE 19*    Recent Labs Lab 02/19/15 1055  AMMONIA 25    CBC:  Recent Labs Lab 02/19/15 1027  WBC 9.3  NEUTROABS 6.2   HGB 16.5  HCT 48.0  MCV 92.4  PLT 207    Cardiac Enzymes:  Recent Labs Lab 02/19/15 1027  TROPONINI <0.03    BNP: Invalid input(s): POCBNP  CBG:  Recent Labs Lab 02/19/15 1234  GLUCAP 105*    Microbiology: Results for orders placed or performed in visit on 09/18/14  Clostridium Difficile [facility])     Status: None   Collection Time: 09/18/14  5:37 PM  Result Value Ref Range Status   Micro Text Report   Final       C.DIFFICILE ANTIGEN       C.DIFFICILE GDH ANTIGEN : NEGATIVE   C.DIFFICILE TOXIN A/B     C.DIFFICILE TOXINS A AND B : NEGATIVE   INTERPRETATION            Negative for C. difficile.    ANTIBIOTIC                                                        Coagulation Studies: No results for input(s): LABPROT, INR in the last 72 hours.  Urinalysis: No results for input(s): COLORURINE, LABSPEC, PHURINE, GLUCOSEU, HGBUR, BILIRUBINUR, KETONESUR, PROTEINUR, UROBILINOGEN, NITRITE, LEUKOCYTESUR in the last 168 hours.  Invalid input(s): APPERANCEUR  Lipid Panel:  No results found for: CHOL, TRIG, HDL, CHOLHDL, VLDL, LDLCALC  HgbA1C: No results found for: HGBA1C  Urine Drug Screen:  No results found for: LABOPIA, COCAINSCRNUR, LABBENZ, AMPHETMU, THCU, LABBARB  Alcohol Level: No results for input(s): ETH in the last 168 hours.  Other results: EKG: normal EKG, normal sinus rhythm, unchanged from previous tracings.  Imaging: Dg Chest 2 View  02/19/2015   CLINICAL DATA:  Difficulty breathing and chest pain, chronic.  EXAM: CHEST  2 VIEW  COMPARISON:  12/14/2014.  FINDINGS: Trachea is midline. Heart size stable. Lungs are clear. No pleural fluid. Left hemidiaphragm is elevated. Flowing anterior osteophytosis in the thoracic spine.  IMPRESSION: No acute findings.   Electronically Signed   By: Lorin Picket M.D.   On: 02/19/2015 10:51   Ct Head Wo Contrast  02/19/2015   CLINICAL DATA:  Altered mental status, confusion, slurred speech, recent seizures, also  history of pancreatic carcinoma  EXAM: CT HEAD WITHOUT CONTRAST  TECHNIQUE: Contiguous axial images were obtained from the base of the skull through the vertex without intravenous contrast.  COMPARISON:  CT brain scan of 12/15/2014  FINDINGS: The ventricular system remains slightly prominent with mild cortical atrophy present as well. The septum is midline in position. No hemorrhage, mass lesion, or acute infarction is seen. On bone window images, no calvarial abnormality is seen. The paranasal sinuses are pneumatized.  IMPRESSION: No acute intracranial abnormality. Little change in mild cortical atrophy.  Electronically Signed   By: Ivar Drape M.D.   On: 02/19/2015 16:20     Assessment/Plan: 59 y/o male with two prior admission of hypoactive/hyperactive delirium presented with confusion/disorientation, inability to ambulate, generalize weakness.  - vomiting/diarhea could be related to dehydration - hydration/thiamine/folate - con't seroquel 50 nightly  - con't lamictal 25mg  day/50 night  - pt does have mass in pancreas suspected cancer. MRI brain w/ and w/out contrast for suspicion of carcinomatous meningitis.  - Pt/OT - if improves tomorrow, d/c planning.  D/w family at bedside.    02/19/2015, 4:30 PM

## 2015-02-19 NOTE — ED Notes (Signed)
BIB EMS from home with complaints of difficulty breathing and chest pain. Pt not found to be in distress upon EMS arrival. 98% RA. Chest pain 5/10 per pt.

## 2015-02-19 NOTE — ED Notes (Signed)
Per wife pt took a one Asprin 81mg  this am.

## 2015-02-19 NOTE — H&P (Signed)
[facility] [place] NAME: Maurice Patterson    MR#:  [id number]  DATE OF BIRTH:  Jan 28, 1956  DATE OF ADMISSION:  02/19/2015  PRIMARY CARE PHYSICIAN: [name], MD   REQUESTING/REFERRING PHYSICIAN: Brenton Grills  CHIEF COMPLAINT:   Chief Complaint  Patient presents with  . Respiratory Distress  . Chest Pain   altered mental status  HISTORY OF PRESENT ILLNESS:  Maurice Patterson  is a 59 y.o. male with a known history of seizures, hypertension, diabetes, chronic pancreatitis, history of coronary disease with previous MI, who presented to the hospital due to altered mental status/confusion. Patient himself is a poor historian therefore most of the history obtained from the family at bedside. As per the family patient has been his usual state of health and his mental status has been stable now for the past few weeks. Patient has recently been diagnosed with seizures and was started on Keppra but had an adverse effect with diarrhea and therefore was taken off of it and then switched to Lamictal. Patient has been slowly titrating his Lamictal and had his first dose of 50 mg at bedtime last night. This morning when he woke up he was in his usual state but shortly thereafter he became more confused and started complaining of chest pain and shortness of breath. Patient's wife wasn't home and her grandkids called her telling her that her husband wasn't doing well.  EMS was called and patient was brought to the hospital for further evaluation. Patient still remains somewhat confused and delirious and therefore hospitalist services were contacted further treatment and evaluation.  PAST MEDICAL HISTORY:   Past Medical History  Diagnosis Date  . Seizures   . Memory loss   . Arthritis   . Stroke   . Hypertension   . Diabetes mellitus without complication   . Malignant intraductal papillary mucinous tumor of pancreas   . Hernia of abdominal  cavity   . Degenerative lumbar disc   . Acute MI     PAST SURGICAL HISTORY:   Past Surgical History  Procedure Laterality Date  . Left arm metal plate    . Stent times 2    . Left shoulder surgery      SOCIAL HISTORY:   History  Substance Use Topics  . Smoking status: Former Research scientist (life sciences)  . Smokeless tobacco: Not on file  . Alcohol Use: No    FAMILY HISTORY:   Family History  Problem Relation Age of Onset  . Diabetes Mother   . Hypertension Mother   . Alzheimer's disease Father     DRUG ALLERGIES:   Allergies  Allergen Reactions  . Hydrocodone Anaphylaxis  . Ambien [Zolpidem] Other (See Comments)    delirium    . Brilinta [Ticagrelor] Other (See Comments)    Stroke   . Penicillins Rash    REVIEW OF SYSTEMS:   Review of Systems  Unable to perform ROS due to pt's altered mental status.    MEDICATIONS AT HOME:   Prior to Admission medications   Medication Sig Start Date End Date Taking? Authorizing Provider  Acetaminophen 500 MG coapsule Take 500 mg by mouth 2 (two) times daily.   Yes Historical Provider, MD  capsicum oleoresin (TRIXAICIN) 0.025 % cream Apply 1 application topically 2 (two) times daily.   Yes Historical Provider, MD  cholestyramine light (PREVALITE) 4 GM/DOSE powder Take 4 g by mouth 6 (six) times daily.   Yes Historical Provider, MD  clopidogrel (  PLAVIX) 75 MG tablet Take 1 tablet by mouth daily.   Yes Historical Provider, MD  cyclobenzaprine (FLEXERIL) 10 MG tablet Take 1 tablet by mouth 3 (three) times daily as needed for muscle spasms.    Yes Historical Provider, MD  furosemide (LASIX) 40 MG tablet Take 1 tablet by mouth daily.   Yes Historical Provider, MD  insulin glargine (LANTUS) 100 UNIT/ML injection Inject 75 Units into the skin at bedtime.   Yes Historical Provider, MD  insulin lispro (HUMALOG) 100 UNIT/ML KiwkPen Inject 2-8 Units into the skin 3 (three) times daily. Per sliding scale   Yes Historical Provider, MD  lamoTRIgine  (LAMICTAL) 25 MG tablet Take 1-8 tablets by mouth 2 (two) times daily. Week 1: 1 tablet at bedtime Week 2: 1 tablet twice daily Week 3: 1 am, 2 pm Week 4: 2 twice daily Week 5: 2 am, 3 pm Week 6: 3 twice daily Week 7: 3 am, 4 pm Week 8: 4 twice daily 02/04/15  Yes Historical Provider, MD  losartan (COZAAR) 100 MG tablet Take 1 tablet by mouth daily. 04/28/14  Yes Historical Provider, MD  metoprolol succinate (TOPROL-XL) 25 MG 24 hr tablet Take 1 tablet by mouth daily.   Yes Historical Provider, MD  nitroGLYCERIN (NITROSTAT) 0.4 MG SL tablet Place 1 tablet under the tongue every 5 (five) minutes as needed for chest pain.    Yes Historical Provider, MD  potassium chloride (K-DUR) 10 MEQ tablet Take 10 mEq by mouth 2 (two) times daily.   Yes Historical Provider, MD  QUEtiapine (SEROQUEL) 50 MG tablet Take 1 tablet by mouth daily.   Yes Historical Provider, MD      VITAL SIGNS:  Blood pressure 134/82, pulse 96, temperature 98.4 F (36.9 C), temperature source Oral, resp. rate 17, height 5\' 7"  (1.702 m), weight 77.111 kg (170 lb), SpO2 100 %.  PHYSICAL EXAMINATION:  Physical Exam  GENERAL:  59 y.o.-year-old patient lying in the bed with no acute distress.  EYES: Pupils equal, round, reactive to light and accommodation. No scleral icterus. Extraocular muscles intact.  HEENT: Head atraumatic, normocephalic. Oropharynx and nasopharynx clear. No oropharyngeal erythema, moist oral mucosa  NECK:  Supple, no jugular venous distention. No thyroid enlargement, no tenderness.  LUNGS: Normal breath sounds bilaterally, no wheezing, rales, rhonchi. No use of accessory muscles of respiration.  CARDIOVASCULAR: S1, S2 RRR. No murmurs, rubs, gallops, clicks.  ABDOMEN: Soft, nontender, nondistended. Bowel sounds present. No organomegaly or mass.  EXTREMITIES: No pedal edema, cyanosis, or clubbing. + 2 pedal & radial pulses b/l.   NEUROLOGIC: Cranial nerves II through XII are intact. No focal Motor or sensory  deficits appreciated b/l PSYCHIATRIC: The patient is alert and oriented x 1.  SKIN: No obvious rash, lesion, or ulcer.   LABORATORY PANEL:   CBC  Recent Labs Lab 02/19/15 1027  WBC 9.3  HGB 16.5  HCT 48.0  PLT 207   ------------------------------------------------------------------------------------------------------------------  Chemistries   Recent Labs Lab 02/19/15 1027  NA 140  K 3.8  CL 106  CO2 24  GLUCOSE 115*  BUN 14  CREATININE 0.77  CALCIUM 9.9  AST 30  ALT 35  ALKPHOS 228*  BILITOT 0.6   ------------------------------------------------------------------------------------------------------------------  Cardiac Enzymes  Recent Labs Lab 02/19/15 1027  TROPONINI <0.03   ------------------------------------------------------------------------------------------------------------------  RADIOLOGY:  Dg Chest 2 View  02/19/2015   CLINICAL DATA:  Difficulty breathing and chest pain, chronic.  EXAM: CHEST  2 VIEW  COMPARISON:  12/14/2014.  FINDINGS: Trachea is midline.  Heart size stable. Lungs are clear. No pleural fluid. Left hemidiaphragm is elevated. Flowing anterior osteophytosis in the thoracic spine.  IMPRESSION: No acute findings.   Electronically Signed   By: Lorin Picket M.D.   On: 02/19/2015 10:51     IMPRESSION AND PLAN:   59 year old male with past medical history of seizures, hypertension, chronic pancreatitis, diabetes, who presented to the hospital due to confusion and altered mental status.  #1 altered mental status-the exact etiology of this is unclear. Suspected to be due to acute delirium/metabolic encephalopathy. -We'll observe patient overnight. Avoid delirogenic meds like benzodiazepines. -No evidence of acute infectious source, ammonia level normal. We will get CT scan of head. -Follow every 4 hours neuro checks, get a neurology consult. -Patient has been admitted to the hospital for similar reasons about 2 months ago and was  thought to have delirium and started on Remeron/Seroquel and improved.  #2 diabetes-continue Lantus, NovoLog with meals. -No evidence of hypoglycemia.  #3 hypertension-hemodynamically stable. Continue metoprolol, losartan.  #4 history of seizures-continue Lamictal -Await neurology input.  #5 chronic diarrhea-continue cholestyramine.  #6 history of CHF-clinically patient does not appear to be in congestive heart failure. -Continue Lasix continue beta blocker, Cozaar    All the records are reviewed and case discussed with ED provider. Management plans discussed with the patient, family and they are in agreement.  CODE STATUS: Full  TOTAL TIME TAKING CARE OF THIS PATIENT: 60 minutes.    Henreitta Leber M.D on 02/19/2015 at 3:35 PM  Between 7am to 6pm - Pager - [phone]  After 6pm go to [email or web address] - password [email or web address] [facility]  Office  [phone]  CC: Primary care physician; [name], MD

## 2015-02-19 NOTE — ED Provider Notes (Signed)
[facility] Emergency Department Provider Note  ____________________________________________  Time seen: 10:15 AM  I have reviewed the triage vital signs and the nursing notes.   HISTORY  Chief Complaint Respiratory Distress and Chest Pain  history Limited due to altered mental status. Additional history obtained by wife   HPI Maurice [place] is a 59 y.o. male is brought to the ED today for difficulty breathing, chest pain, and altered mental status. The patient has a history of pink reticulocyte tumor and is planned for a Whipple surgery. However, this has been on hold due to long-standing mental status issues. This was initially thought to be early onset dementia, however after exhaustive workup including multiple CT scans, MRIs, and finally a 3-4 hour EEG, Dr. Melrose Nakayama with neurology was able to determine that this is actual a seizure disorder. He was started on Keppra at the beginning of June, which is been titrated upward, and his family reports that he has been much much better and back to his normal self. However, after being seen normal at 7 AM this morning, he was seen again by his family around 48 or 11 AM and was noted to be confused again. He did not recognize his family and was slurring his speech.  No falls or trauma. The patient has had diarrhea over the last week. This had been a chronic issue for him with his pancreatic disease, but he has not been on Creon recently and the diarrhea had resolved but has not recurred.      Past Medical History  Diagnosis Date  . Seizures   . Memory loss   . Arthritis   . Stroke   . Hypertension   . Diabetes mellitus without complication   . Malignant intraductal papillary mucinous tumor of pancreas   . Hernia of abdominal cavity   . Degenerative lumbar disc   . Acute MI     There are no active problems to display for this patient.   Past Surgical History  Procedure Laterality Date  . Left arm metal  plate    . Stent times 2    . Left shoulder surgery      Current Outpatient Rx  Name  Route  Sig  Dispense  Refill  . Acetaminophen 500 MG coapsule   Oral   Take 500 mg by mouth 2 (two) times daily.         . capsicum oleoresin (TRIXAICIN) 0.025 % cream   Topical   Apply 1 application topically 2 (two) times daily.         . cholestyramine light (PREVALITE) 4 GM/DOSE powder   Oral   Take 4 g by mouth 6 (six) times daily.         . clopidogrel (PLAVIX) 75 MG tablet   Oral   Take 1 tablet by mouth daily.         . cyclobenzaprine (FLEXERIL) 10 MG tablet   Oral   Take 1 tablet by mouth 3 (three) times daily as needed for muscle spasms.          . furosemide (LASIX) 40 MG tablet   Oral   Take 1 tablet by mouth daily.         . insulin glargine (LANTUS) 100 UNIT/ML injection   Subcutaneous   Inject 75 Units into the skin at bedtime.         . insulin lispro (HUMALOG) 100 UNIT/ML KiwkPen   Subcutaneous   Inject 2-8 Units into  the skin 3 (three) times daily. Per sliding scale         . lamoTRIgine (LAMICTAL) 25 MG tablet   Oral   Take 1-8 tablets by mouth 2 (two) times daily. Week 1: 1 tablet at bedtime Week 2: 1 tablet twice daily Week 3: 1 am, 2 pm Week 4: 2 twice daily Week 5: 2 am, 3 pm Week 6: 3 twice daily Week 7: 3 am, 4 pm Week 8: 4 twice daily         . losartan (COZAAR) 100 MG tablet   Oral   Take 1 tablet by mouth daily.         . metoprolol succinate (TOPROL-XL) 25 MG 24 hr tablet   Oral   Take 1 tablet by mouth daily.         . nitroGLYCERIN (NITROSTAT) 0.4 MG SL tablet   Sublingual   Place 1 tablet under the tongue every 5 (five) minutes as needed for chest pain.          . potassium chloride (K-DUR) 10 MEQ tablet   Oral   Take 10 mEq by mouth 2 (two) times daily.         . QUEtiapine (SEROQUEL) 50 MG tablet   Oral   Take 1 tablet by mouth daily.           Allergies Hydrocodone; Ambien; Brilinta; and  Penicillins  No family history on file.  Social History History  Substance Use Topics  . Smoking status: Former Research scientist (life sciences)  . Smokeless tobacco: Not on file  . Alcohol Use: No    Review of Systems  Constitutional: No fever or chills. No weight changes Eyes:No blurry vision or double vision.  ENT: No sore throat. Cardiovascular: No chest pain. Respiratory: No dyspnea or cough. Gastrointestinal: Negative for abdominal pain, vomiting and diarrhea.  No BRBPR or melena. Genitourinary: Negative for dysuria, urinary retention, bloody urine, or difficulty urinating. Musculoskeletal: Negative for back pain. No joint swelling or pain. Skin: Negative for rash. Neurological: Negative for headaches, focal weakness or numbness. Psychiatric:No anxiety or depression.   Endocrine:No hot/cold intolerance, changes in energy, or sleep difficulty.  10-point ROS otherwise negative.  ____________________________________________   PHYSICAL EXAM:  VITAL SIGNS: ED Triage Vitals  Enc Vitals Group     BP 02/19/15 1038 133/86 mmHg     Pulse Rate 02/19/15 1038 99     Resp 02/19/15 1038 18     Temp 02/19/15 1038 98.4 F (36.9 C)     Temp Source 02/19/15 1038 Oral     SpO2 02/19/15 1038 100 %     Weight 02/19/15 1038 170 lb (77.111 kg)     Height 02/19/15 1038 5\' 7"  (1.702 m)     Head Cir --      Peak Flow --      Pain Score 02/19/15 1039 5     Pain Loc --      Pain Edu? --      Excl. in [place]? --      Constitutional: Alert and orientedTo self only . Well appearing and in no distress. Eyes: No scleral icterus. No conjunctival pallor. PERRL. EOMI ENT   Head: Normocephalic and atraumatic.   Nose: No congestion/rhinnorhea. No septal hematoma   Mouth/ThroatDry mucous membranes , no pharyngeal erythema. No peritonsillar mass. No uvula shift.   Neck: No stridor. No SubQ emphysema. No meningismus. Hematological/Lymphatic/Immunilogical: No cervical lymphadenopathy. Cardiovascular: RRR.  Normal and symmetric distal pulses are present in all  extremities. No murmurs, rubs, or gallops. Respiratory: Normal respiratory effort without tachypnea nor retractions. Breath sounds are clear and equal bilaterally. No wheezes/rales/rhonchi. Gastrointestinal: Soft and nontender. No distention. There is no CVA tenderness.  No rebound, rigidity, or guarding. Genitourinary: deferred Musculoskeletal: Nontender with normal range of motion in all extremities. No joint effusions.  No lower extremity tenderness.  No edema. Neurologic: Mumbling speech.  CN 2-10 normal. Motor grossly intact. No pronator drift.  Normal gait. No gross focal neurologic deficits are appreciated.  Skin:  Skin is warm, dry and intact. No rash noted.  No petechiae, purpura, or bullae. Psychiatric: Mood and affect are normal. Speech and behavior are normal. Patient exhibits appropriate insight and judgment.  ____________________________________________    LABS (pertinent positives/negatives) (all labs ordered are listed, but only abnormal results are displayed) Labs Reviewed  COMPREHENSIVE METABOLIC PANEL - Abnormal; Notable for the following:    Glucose, Bld 115 (*)    Alkaline Phosphatase 228 (*)    All other components within normal limits  LIPASE, BLOOD - Abnormal; Notable for the following:    Lipase 19 (*)    All other components within normal limits  CBC WITH DIFFERENTIAL/PLATELET - Abnormal; Notable for the following:    Basophils Absolute 0.2 (*)    All other components within normal limits  GLUCOSE, CAPILLARY - Abnormal; Notable for the following:    Glucose-Capillary 105 (*)    All other components within normal limits  TROPONIN I  AMMONIA  URINALYSIS COMPLETEWITH MICROSCOPIC ([facility] ONLY)  URINE DRUG SCREEN, QUALITATIVE ([facility] ONLY)  TSH   ____________________________________________   EKG  Interpreted by me  Date: 02/19/2015  Rate: 100  Rhythm: normal sinus rhythm  QRS Axis: normal   Intervals: normal  ST/T Wave abnormalities: normal  Conduction Disutrbances: none  Narrative Interpretation: unremarkable      ____________________________________________    RADIOLOGY  Chest x-ray unremarkable   ____________________________________________   PROCEDURES  ____________________________________________   INITIAL IMPRESSION / ASSESSMENT AND PLAN / ED COURSE  Pertinent labs & imaging results that were available during my care of the patient were reviewed by me and considered in my medical decision making (see chart for details).  Patient presents with altered mental status with a history of significant GI disease. We'll check CMP lipase and ammonia as well as chest x-ray.   ----------------------------------------- 2:54 PM on 02/19/2015 -----------------------------------------  Labs and chest x-ray unremarkable. Hemodynamically stable. Remains confused. I discussed this at length with the patient's spouse and daughter. They feel that this is not consistent with his usual post ictal confusion. They also report that he's been sleeping well and so therefore is different from his previous hospitalization back in April when he got much better after having a good night of sleep in the hospital. They're concerned that he's had a stroke. They initially refused CT scan of the head, but on further discussion with them they have now agreed to allow CT scan to evaluate for intracranial pathology such as stroke. I discussed the patient's case with the hospitalist who will evaluate the patient for hospitalization. I reviewed the hospital summary from his previous admission as well as Dr. Lannie Fields neurology clinic note. This appears likely related to his chronic seizure disorder or possibly sleep problems, but the family is indicating that this does not seem consistent with those issues to them and how they have previously affected the patient, so we will proceed with a further workup  and hospital management for this acute issue.  ____________________________________________  FINAL CLINICAL IMPRESSION(S) / ED DIAGNOSES  Final diagnoses:  Disorientation      Carrie Mew, MD 02/19/15 [phone]

## 2015-02-19 NOTE — ED Notes (Signed)
Pt return from ct scan.  Report called to floor nurse wynette rn.

## 2015-02-20 ENCOUNTER — Observation Stay: Payer: Medicare Other

## 2015-02-20 DIAGNOSIS — R404 Transient alteration of awareness: Secondary | ICD-10-CM | POA: Diagnosis not present

## 2015-02-20 LAB — CBC
HEMATOCRIT: 45.4 % (ref 40.0–52.0)
HEMOGLOBIN: 15.6 g/dL (ref 13.0–18.0)
MCH: 31.9 pg (ref 26.0–34.0)
MCHC: 34.3 g/dL (ref 32.0–36.0)
MCV: 93 fL (ref 80.0–100.0)
PLATELETS: 181 10*3/uL (ref 150–440)
RBC: 4.88 MIL/uL (ref 4.40–5.90)
RDW: 13.9 % (ref 11.5–14.5)
WBC: 7.3 10*3/uL (ref 3.8–10.6)

## 2015-02-20 LAB — BASIC METABOLIC PANEL
ANION GAP: 6 (ref 5–15)
BUN: 12 mg/dL (ref 6–20)
CALCIUM: 9 mg/dL (ref 8.9–10.3)
CHLORIDE: 104 mmol/L (ref 101–111)
CO2: 26 mmol/L (ref 22–32)
CREATININE: 0.77 mg/dL (ref 0.61–1.24)
GFR calc non Af Amer: >60 mL/min (ref >60)
GLUCOSE: 317 mg/dL — AB (ref 65–99)
Potassium: 4.2 mmol/L (ref 3.5–5.1)
Sodium: 136 mmol/L (ref 135–145)

## 2015-02-20 LAB — GLUCOSE, CAPILLARY
GLUCOSE-CAPILLARY: 280 mg/dL — AB (ref 65–99)
Glucose-Capillary: 366 mg/dL — ABNORMAL HIGH (ref 65–99)
Glucose-Capillary: 291 mg/dL — ABNORMAL HIGH (ref 65–99)
Glucose-Capillary: 175 mg/dL — ABNORMAL HIGH (ref 65–99)

## 2015-02-20 MED ORDER — GADOBENATE DIMEGLUMINE 529 MG/ML IV SOLN
20.0000 mL | Freq: Once | INTRAVENOUS | Status: AC | PRN
  Administered 2015-02-20: 16 mL via INTRAVENOUS

## 2015-02-20 NOTE — Care Management (Signed)
Admitted to [facility] with the diagnosis of altered mental status. Lives with wife, Cecille Rubin. [phone]. Sees Dr. [name], Last seen 2-3 weeks ago. Takes care of all [place] activities of daily living himself. States he hasn't driven in 2-3 years. No home health. No skilled facility. No home oxygen. Uses no devices for ambulation. Gets medications  filled at [facility] in [place]. Wife will transport. Shelbie Ammons RN MSN Care Management [phone]

## 2015-02-20 NOTE — Discharge Summary (Signed)
[facility] NAME: Maurice [place]    MR#:  [id number]  DATE OF BIRTH:  October 03, 1955  DATE OF ADMISSION:  02/19/2015 ADMITTING PHYSICIAN: Henreitta Leber, MD  DATE OF DISCHARGE: 02/20/2015  PRIMARY CARE PHYSICIAN: [name], MD    ADMISSION DIAGNOSIS:  Disorientation [R41.0]   DISCHARGE DIAGNOSIS:    SECONDARY DIAGNOSIS:   Past Medical History  Diagnosis Date  . Seizures   . Memory loss   . Arthritis   . Stroke   . Hypertension   . Diabetes mellitus without complication   . Malignant intraductal papillary mucinous tumor of pancreas   . Hernia of abdominal cavity   . Degenerative lumbar disc   . Acute MI     HOSPITAL COURSE:   #1 altered mental status-the exact etiology of this is unclear. Suspected to be due to acute delirium/metabolic encephalopathy. Improved. MRI brain is negative for metastasis or CVA. Per Dr. Irish Elders, continue the same medication and discharge home today.  #2 diabetes-continue Lantus, NovoLog with meals.  #3 hypertension-hemodynamically stable. Continue metoprolol, losartan.  #4 history of seizures-continue Lamictal  DISCHARGE CONDITIONS:   Stable.  CONSULTS OBTAINED:  Treatment Team:  Leotis Pain, MD  DRUG ALLERGIES:   Allergies  Allergen Reactions  . Hydrocodone Anaphylaxis  . Ambien [Zolpidem] Other (See Comments)    delirium    . Brilinta [Ticagrelor] Other (See Comments)    Stroke   . Penicillins Rash    DISCHARGE MEDICATIONS:   Current Discharge Medication List    CONTINUE these medications which have NOT CHANGED   Details  Acetaminophen 500 MG coapsule Take 500 mg by mouth 2 (two) times daily.    capsicum oleoresin (TRIXAICIN) 0.025 % cream Apply 1 application topically 2 (two) times daily.    cholestyramine light (PREVALITE) 4 GM/DOSE powder Take 4 g by mouth 6 (six) times daily.    clopidogrel (PLAVIX) 75 MG tablet Take 1 tablet by mouth  daily.    cyclobenzaprine (FLEXERIL) 10 MG tablet Take 1 tablet by mouth 3 (three) times daily as needed for muscle spasms.     furosemide (LASIX) 40 MG tablet Take 1 tablet by mouth daily.    insulin glargine (LANTUS) 100 UNIT/ML injection Inject 75 Units into the skin at bedtime.    insulin lispro (HUMALOG) 100 UNIT/ML KiwkPen Inject 2-8 Units into the skin 3 (three) times daily. Per sliding scale    lamoTRIgine (LAMICTAL) 25 MG tablet Take 1-8 tablets by mouth 2 (two) times daily. Week 1: 1 tablet at bedtime Week 2: 1 tablet twice daily Week 3: 1 am, 2 pm Week 4: 2 twice daily Week 5: 2 am, 3 pm Week 6: 3 twice daily Week 7: 3 am, 4 pm Week 8: 4 twice daily    losartan (COZAAR) 100 MG tablet Take 1 tablet by mouth daily.    metoprolol succinate (TOPROL-XL) 25 MG 24 hr tablet Take 1 tablet by mouth daily.    nitroGLYCERIN (NITROSTAT) 0.4 MG SL tablet Place 1 tablet under the tongue every 5 (five) minutes as needed for chest pain.     potassium chloride (K-DUR) 10 MEQ tablet Take 10 mEq by mouth 2 (two) times daily.    QUEtiapine (SEROQUEL) 50 MG tablet Take 1 tablet by mouth daily.         DISCHARGE INSTRUCTIONS:    If you experience worsening of your admission symptoms, develop shortness of breath, life threatening emergency, suicidal or homicidal thoughts  you must seek medical attention immediately by calling 911 or calling your MD immediately  if symptoms less severe.  You Must read complete instructions/literature along with all the possible adverse reactions/side effects for all the Medicines you take and that have been prescribed to you. Take any new Medicines after you have completely understood and accept all the possible adverse reactions/side effects.   Please note  You were cared for by a hospitalist during your hospital stay. If you have any questions about your discharge medications or the care you received while you were in the hospital after you are  discharged, you can call the unit and asked to speak with the hospitalist on call if the hospitalist that took care of you is not available. Once you are discharged, your primary care physician will handle any further medical issues. Please note that NO REFILLS for any discharge medications will be authorized once you are discharged, as it is imperative that you return to your primary care physician (or establish a relationship with a primary care physician if you do not have one) for your aftercare needs so that they can reassess your need for medications and monitor your lab values.    Today   SUBJECTIVE   No complaint. AAO times 3.    VITAL SIGNS:  Blood pressure 132/75, pulse 99, temperature 97.6 F (36.4 C), temperature source Oral, resp. rate 18, height 5\' 7"  (1.702 m), weight 77.111 kg (170 lb), SpO2 98 %.  I/O:   Intake/Output Summary (Last 24 hours) at 02/20/15 1348 Last data filed at 02/20/15 1300  Gross per 24 hour  Intake    240 ml  Output    250 ml  Net    -10 ml    PHYSICAL EXAMINATION:  GENERAL:  59 y.o.-year-old patient lying in the bed with no acute distress.  EYES: Pupils equal, round, reactive to light and accommodation. No scleral icterus. Extraocular muscles intact.  HEENT: Head atraumatic, normocephalic. Oropharynx and nasopharynx clear.  NECK:  Supple, no jugular venous distention. No thyroid enlargement, no tenderness.  LUNGS: Normal breath sounds bilaterally, no wheezing, rales,rhonchi or crepitation. No use of accessory muscles of respiration.  CARDIOVASCULAR: S1, S2 normal. No murmurs, rubs, or gallops.  ABDOMEN: Soft, non-tender, non-distended. Bowel sounds present. No organomegaly or mass.  EXTREMITIES: No pedal edema, cyanosis, or clubbing.  NEUROLOGIC: Cranial nerves II through XII are intact. Muscle strength 4/5 in all extremities. Sensation intact. Gait not checked.  PSYCHIATRIC: The patient is alert and oriented x 3.  SKIN: No obvious rash,  lesion, or ulcer.   DATA REVIEW:   CBC  Recent Labs Lab 02/20/15 0514  WBC 7.3  HGB 15.6  HCT 45.4  PLT 181    Chemistries   Recent Labs Lab 02/19/15 1027 02/20/15 0514  NA 140 136  K 3.8 4.2  CL 106 104  CO2 24 26  GLUCOSE 115* 317*  BUN 14 12  CREATININE 0.77 0.77  CALCIUM 9.9 9.0  AST 30  --   ALT 35  --   ALKPHOS 228*  --   BILITOT 0.6  --     Cardiac Enzymes  Recent Labs Lab 02/19/15 1027  TROPONINI <0.03    Microbiology Results  Results for orders placed or performed in visit on 09/18/14  Clostridium Difficile [facility])     Status: None   Collection Time: 09/18/14  5:37 PM  Result Value Ref Range Status   Micro Text Report   Final  C.DIFFICILE ANTIGEN       C.DIFFICILE GDH ANTIGEN : NEGATIVE   C.DIFFICILE TOXIN A/B     C.DIFFICILE TOXINS A AND B : NEGATIVE   INTERPRETATION            Negative for C. difficile.    ANTIBIOTIC                                                        RADIOLOGY:  Dg Chest 2 View  02/19/2015   CLINICAL DATA:  Difficulty breathing and chest pain, chronic.  EXAM: CHEST  2 VIEW  COMPARISON:  12/14/2014.  FINDINGS: Trachea is midline. Heart size stable. Lungs are clear. No pleural fluid. Left hemidiaphragm is elevated. Flowing anterior osteophytosis in the thoracic spine.  IMPRESSION: No acute findings.   Electronically Signed   By: Lorin Picket M.D.   On: 02/19/2015 10:51   Ct Head Wo Contrast  02/19/2015   CLINICAL DATA:  Altered mental status, confusion, slurred speech, recent seizures, also history of pancreatic carcinoma  EXAM: CT HEAD WITHOUT CONTRAST  TECHNIQUE: Contiguous axial images were obtained from the base of the skull through the vertex without intravenous contrast.  COMPARISON:  CT brain scan of 12/15/2014  FINDINGS: The ventricular system remains slightly prominent with mild cortical atrophy present as well. The septum is midline in position. No hemorrhage, mass lesion, or acute infarction is seen. On  bone window images, no calvarial abnormality is seen. The paranasal sinuses are pneumatized.  IMPRESSION: No acute intracranial abnormality. Little change in mild cortical atrophy.   Electronically Signed   By: Ivar Drape M.D.   On: 02/19/2015 16:20   Mr Jeri Cos NI Contrast  02/20/2015   CLINICAL DATA:  Occasional headaches with blurred vision. Confusion/disorientation. Generalize weakness. History of prior strokes. History of chronic pancreatitis and possible pancreatic cancer. Evaluation for carcinomatous meningitis.  EXAM: MRI HEAD WITHOUT AND WITH CONTRAST  TECHNIQUE: Multiplanar, multiecho pulse sequences of the brain and surrounding structures were obtained without and with intravenous contrast.  CONTRAST:  8mL MULTIHANCE GADOBENATE DIMEGLUMINE 529 MG/ML IV SOLN  COMPARISON:  Head CT 02/19/2015  FINDINGS: There is no evidence of acute infarct, intracranial hemorrhage, mass, midline shift, or extra-axial fluid collection. There is mild generalized cerebral atrophy. Several small foci of T2 hyperintensity in the subcortical and deep cerebral white matter are nonspecific but may reflect minimal chronic small vessel ischemic disease. No abnormal enhancement is identified.  Paranasal sinuses are clear. There is a trace right mastoid effusion. Major intracranial vascular flow voids are preserved. Orbits are unremarkable.  IMPRESSION: 1. No evidence of acute intracranial abnormality or metastases. 2. Mild cerebral atrophy in minimal chronic small vessel ischemic disease.   Electronically Signed   By: Logan Bores   On: 02/20/2015 13:37        Management plans discussed with the patient, family and they are in agreement.  CODE STATUS:     Code Status Orders        Start     Ordered   02/19/15 1628  Full code   Continuous     02/19/15 1627      TOTAL TIME TAKING CARE OF THIS PATIENT: 37  minutes.    Demetrios Loll M.D on 02/20/2015 at 1:48 PM  Between 7am to 6pm - Pager -  [phone]  After 6pm go to [email or web address] - password [email or web address] [facility]  Office  [phone]  CC: Primary care physician; [name], MD

## 2015-02-20 NOTE — Progress Notes (Signed)
Inpatient Diabetes Program Recommendations  AACE/ADA: New Consensus Statement on Inpatient Glycemic Control (2013)  Target Ranges:  Prepandial:   less than 140 mg/dL      Peak postprandial:   less than 180 mg/dL (1-2 hours)      Critically ill patients:  140 - 180 mg/dL   Results for VELDON, WAGER (MRN [id number]) as of 02/20/2015 11:28  Ref. Range 02/19/2015 12:34 02/19/2015 17:22 02/19/2015 20:00 02/20/2015 07:19 02/20/2015 11:13  Glucose-Capillary Latest Ref Range: 65-99 mg/dL 105 (H) 159 (H) 357 (H) 366 (H) 280 (H)    Diabetes history: DM2 Outpatient Diabetes medications: Lantus 75 units daily, Humalog 2-8 units TID with meals Current orders for Inpatient glycemic control: Lantus 75 units QHS, Novolog 0-15 units TID with meals, Novolog 0-5 units HS  Inpatient Diabetes Program Recommendations Insulin - Basal: Please consider increasing Lantus to 80 units QHS. Correction (SSI): Please consider increasing Novolog correction to Resistant scale. Insulin - Meal Coverage: Please consider ordering Novolog 6 units TID with meals for meal coverage (in addition to Novolog correction).  Thanks, Barnie Alderman, RN, MSN, CCRN, CDE Diabetes Coordinator Inpatient Diabetes Program [phone] (Team Pager from [place] to [place]) [phone] ([facility] office) [phone] [facility] office) [phone] [facility] office)

## 2015-02-20 NOTE — Discharge Instructions (Signed)
2 g sodium, low fat, ADA diet. Activity as tolerated.

## 2015-02-20 NOTE — Plan of Care (Signed)
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 Pt continues home medications. He does not complain of pain or discomfort, has been lethargic most of shift. Urine specimen collected. Will continue to monitor.

## 2015-02-20 NOTE — Evaluation (Signed)
Physical Therapy Evaluation Patient Details Name: Maurice Patterson MRN: [id number] DOB: 1956/05/25 Today's Date: 02/20/2015   History of Present Illness  Maurice Patterson  is a 59 y.o. male with a known history of seizures, hypertension, diabetes, chronic pancreatitis, history of coronary disease with previous MI, who presented to the hospital due to altered mental status/confusion. Patient himself is a poor historian therefore most of the history obtained from the family at bedside. As per the family patient has been his usual state of health and his mental status has been stable now for the past few weeks. Patient has recently been diagnosed with seizures and was started on Keppra but had an adverse effect with diarrhea and therefore was taken off of it and then switched to Lamictal. Patient has been slowly titrating his Lamictal and had his first dose of 50 mg at bedtime last night. This morning when he woke up he was in his usual state but shortly thereafter he became more confused and started complaining of chest pain and shortness of breath. Patient's wife wasn't home and her grandkids called her telling her that her husband wasn't doing well.  EMS was called and patient was brought to the hospital for further evaluation. Neurology has evaluated the patient. During PT evaluation pt is AOx2 and is a very poor historian. At baseline pt reports independence with ADLs and assist from wife with IADLs.  Clinical Impression  Pt is safe to discharge home with OP PT for balance. He is relatively unsafe and AOx2 so will need assist from family. Will benefit from OP PT for balance. Pt will benefit from skilled PT services to address deficits in strength, balance, and mobility in order to return to full function at home.     Follow Up Recommendations Outpatient PT (for balance)    Equipment Recommendations  Cane (Instructed to purchase single point cane, community amb)    Recommendations for Other Services        Precautions / Restrictions Precautions Precautions: None Restrictions Weight Bearing Restrictions: No      Mobility  Bed Mobility Overal bed mobility: Independent                Transfers Overall transfer level: Independent Equipment used: None             General transfer comment: Upon arrival pt ambulating back from bathroom. Able to transfer without UE support with good stability noted  Ambulation/Gait Ambulation/Gait assistance: Min guard Ambulation Distance (Feet): 80 Feet Assistive device: None Gait Pattern/deviations: Shuffle   Gait velocity interpretation: <1.8 ft/sec, indicative of risk for recurrent falls General Gait Details: Pt ambulates with short, shuffling steps. He is able to perform horizontal and vertical head turns with slowing of speed but no lateral deviations. Pt unable to increase or decrease speed with commands. Grossly stable but risk for falls is present  Financial trader Rankin (Stroke Patients Only)       Balance Overall balance assessment: Needs assistance Sitting-balance support: No upper extremity supported;Feet supported Sitting balance-Leahy Scale: Good     Standing balance support: No upper extremity supported Standing balance-Leahy Scale: Fair   Single Leg Stance - Right Leg: 1 Single Leg Stance - Left Leg: 1     Rhomberg - Eyes Opened: 20 Rhomberg - Eyes Closed: 20                 Pertinent Vitals/Pain  Pain Assessment: No/denies pain    Home Living Family/patient expects to be discharged to:: Private residence Living Arrangements: Spouse/significant other Available Help at Discharge: Family Type of Home: House Home Access: Stairs to enter Entrance Stairs-Rails: Can reach both (Unclear if accurate) Entrance Stairs-Number of Steps: 3 Home Layout: One level Home Equipment:  (no assistive device, tub shower without chair) Additional Comments: Unclear if  accurate history    Prior Function Level of Independence: Independent         Comments: Assist with IADLs (Unclear if accurate history)     Hand Dominance        Extremity/Trunk Assessment   Upper Extremity Assessment: Overall WFL for tasks assessed (Grossly 4+/5 thoroughout UE. LUE postsurgical atrophy)           Lower Extremity Assessment: Overall WFL for tasks assessed (4+/5 throughout, poor command follow)         Communication   Communication:  (Slurred speech)  Cognition Arousal/Alertness: Awake/alert Behavior During Therapy: WFL for tasks assessed/performed Overall Cognitive Status: Within Functional Limits for tasks assessed       Memory: Decreased short-term memory              General Comments      Exercises        Assessment/Plan    PT Assessment Patient needs continued PT services  PT Diagnosis Difficulty walking;Abnormality of gait   PT Problem List Decreased strength;Decreased balance;Decreased mobility;Decreased knowledge of use of DME;Decreased safety awareness  PT Treatment Interventions DME instruction;Gait training;Stair training;Therapeutic activities;Therapeutic exercise;Balance training;Neuromuscular re-education   PT Goals (Current goals can be found in the Care Plan section) Acute Rehab PT Goals Patient Stated Goal: "I'm going home" PT Goal Formulation: With patient Time For Goal Achievement: 03/06/15 Potential to Achieve Goals: Fair    Frequency Min 2X/week   Barriers to discharge        Co-evaluation               End of Session Equipment Utilized During Treatment: Gait belt Activity Tolerance: Patient tolerated treatment well Patient left: in bed (mod fall risk)      Functional Assessment Tool Used: clinical judgement Functional Limitation: Mobility: Walking and moving around Mobility: Walking and Moving Around Current Status [phone]): At least 20 percent but less than 40 percent impaired, limited or  restricted Mobility: Walking and Moving Around Goal Status [phone]): At least 1 percent but less than 20 percent impaired, limited or restricted    Time: 1330-1345 PT Time Calculation (min) (ACUTE ONLY): 15 min   Charges:   PT Evaluation $Initial PT Evaluation Tier I: 1 Procedure     PT G Codes:   PT G-Codes **NOT FOR INPATIENT CLASS** Functional Assessment Tool Used: clinical judgement Functional Limitation: Mobility: Walking and moving around Mobility: Walking and Moving Around Current Status ([id number]): At least 20 percent but less than 40 percent impaired, limited or restricted Mobility: Walking and Moving Around Goal Status [phone]): At least 1 percent but less than 20 percent impaired, limited or restricted   Phillips Grout PT, DPT   Huprich,Jason 02/20/2015, 3:19 PM

## 2015-02-20 NOTE — Plan of Care (Signed)
Problem: Discharge Progression Outcomes Goal: Other Discharge Outcomes/Goals Outcome: Progressing Pt is alert with confusion at times, neuro checks done q4 hours, flexeril given prior to MR and patient able to tolerate MR of brain, Neurologist consulted with patient and reports that MR does not show acute changes, pt is on room air, up with stand by assist, good appetite, vitals remain stable, pt is d/c to home, wife notifed at will be able to pick patient up after 18:00. FSBS remain elevated, insulin coverage provided.

## 2015-02-20 NOTE — Consult Note (Signed)
CC: confusion   HPI: Maurice Patterson is an 59 y.o. male who is well known to my service ? Hx of seeizures, hypertension, diabetes, chronic pancreatitis, history of coronary disease with previous MI, who presented to the hospital due to altered mental status/confusion. Patient himself is a poor historian therefore most of the history obtained from the family at bedside. As per the family patient has been his usual state of health and his mental status has been stable now for the past few weeks. Patient has recently been diagnosed with seizures/ on EEG was found to have temporal epileptiform discharges by Dr. Melrose Nakayama  and was started on Keppra but had an adverse effect with diarrhea and therefore was taken off of it and then switched to Lamictal. Patient has been slowly titrating his Lamictal and had his first dose of 50 mg at bedtime last night. This morning when he woke up he was in his usual state but shortly thereafter he became more confused and started complaining of chest pain and shortness of breath. Pt has had 2 prior similar admission with diagnosis of hyperactive/hypoactive delirium.    Past Medical History  Diagnosis Date  . Seizures   . Memory loss   . Arthritis   . Stroke   . Hypertension   . Diabetes mellitus without complication   . Malignant intraductal papillary mucinous tumor of pancreas   . Hernia of abdominal cavity   . Degenerative lumbar disc   . Acute MI     Past Surgical History  Procedure Laterality Date  . Left arm metal plate    . Stent times 2    . Left shoulder surgery      Family History  Problem Relation Age of Onset  . Diabetes Mother   . Hypertension Mother   . Alzheimer's disease Father     Social History:  reports that he has quit smoking. He does not have any smokeless tobacco history on file. He reports that he does not drink alcohol or use illicit drugs.  Allergies  Allergen Reactions  . Hydrocodone Anaphylaxis  . Ambien [Zolpidem] Other  (See Comments)    delirium    . Brilinta [Ticagrelor] Other (See Comments)    Stroke   . Penicillins Rash    Medications: I have reviewed the patient's current medications.  ROS: Unable to obtain  Physical Examination: Blood pressure 132/75, pulse 99, temperature 97.6 F (36.4 C), temperature source Oral, resp. rate 18, height 5\' 7"  (1.702 m), weight 77.111 kg (170 lb), SpO2 98 %.   Neurological Examination Mental Status: Alert, oriented, thought content appropriate.  Speech fluent without evidence of aphasia.  Able to follow 3 step commands without difficulty. Cranial Nerves: II: Discs flat bilaterally; Visual fields grossly normal, pupils equal, round, reactive to light and accommodation III,IV, VI: ptosis not present, extra-ocular motions intact bilaterally V,VII: smile symmetric, facial light touch sensation normal bilaterally VIII: hearing normal bilaterally IX,X: gag reflex present XI: bilateral shoulder shrug XII: midline tongue extension Motor: Right : Upper extremity   4/5    Left:     Upper extremity   3/5  Lower extremity   4/5     Lower extremity   3/5 Tone and bulk:normal tone throughout; no atrophy noted Sensory: Pinprick and light touch intact throughout, bilaterally Deep Tendon Reflexes: 1+ and symmetric throughout Plantars: Right: downgoing   Left: downgoing Cerebellar: normal finger-to-nose, normal rapid alternating movements and normal heel-to-shin test Gait: not examined.  Laboratory Studies:   Basic Metabolic Panel:  Recent Labs Lab 02/19/15 1027 02/20/15 0514  NA 140 136  K 3.8 4.2  CL 106 104  CO2 24 26  GLUCOSE 115* 317*  BUN 14 12  CREATININE 0.77 0.77  CALCIUM 9.9 9.0    Liver Function Tests:  Recent Labs Lab 02/19/15 1027  AST 30  ALT 35  ALKPHOS 228*  BILITOT 0.6  PROT 8.1  ALBUMIN 4.2    Recent Labs Lab 02/19/15 1027  LIPASE 19*    Recent Labs Lab 02/19/15 1055  AMMONIA 25    CBC:  Recent  Labs Lab 02/19/15 1027 02/20/15 0514  WBC 9.3 7.3  NEUTROABS 6.2  --   HGB 16.5 15.6  HCT 48.0 45.4  MCV 92.4 93.0  PLT 207 181    Cardiac Enzymes:  Recent Labs Lab 02/19/15 1027  TROPONINI <0.03    BNP: Invalid input(s): POCBNP  CBG:  Recent Labs Lab 02/19/15 1722 02/19/15 2000 02/20/15 0719 02/20/15 1113 02/20/15 1120  GLUCAP 159* 357* 366* 280* 291*    Microbiology: Results for orders placed or performed in visit on 09/18/14  Clostridium Difficile [facility])     Status: None   Collection Time: 09/18/14  5:37 PM  Result Value Ref Range Status   Micro Text Report   Final       C.DIFFICILE ANTIGEN       C.DIFFICILE GDH ANTIGEN : NEGATIVE   C.DIFFICILE TOXIN A/B     C.DIFFICILE TOXINS A AND B : NEGATIVE   INTERPRETATION            Negative for C. difficile.    ANTIBIOTIC                                                        Coagulation Studies: No results for input(s): LABPROT, INR in the last 72 hours.  Urinalysis: No results for input(s): COLORURINE, LABSPEC, PHURINE, GLUCOSEU, HGBUR, BILIRUBINUR, KETONESUR, PROTEINUR, UROBILINOGEN, NITRITE, LEUKOCYTESUR in the last 168 hours.  Invalid input(s): APPERANCEUR  Lipid Panel:  No results found for: CHOL, TRIG, HDL, CHOLHDL, VLDL, LDLCALC  HgbA1C: No results found for: HGBA1C  Urine Drug Screen:  No results found for: LABOPIA, COCAINSCRNUR, LABBENZ, AMPHETMU, THCU, LABBARB  Alcohol Level: No results for input(s): ETH in the last 168 hours.  Other results: EKG: normal EKG, normal sinus rhythm, unchanged from previous tracings.  Imaging: Dg Chest 2 View  02/19/2015   CLINICAL DATA:  Difficulty breathing and chest pain, chronic.  EXAM: CHEST  2 VIEW  COMPARISON:  12/14/2014.  FINDINGS: Trachea is midline. Heart size stable. Lungs are clear. No pleural fluid. Left hemidiaphragm is elevated. Flowing anterior osteophytosis in the thoracic spine.  IMPRESSION: No acute findings.   Electronically Signed   By:  Lorin Picket M.D.   On: 02/19/2015 10:51   Ct Head Wo Contrast  02/19/2015   CLINICAL DATA:  Altered mental status, confusion, slurred speech, recent seizures, also history of pancreatic carcinoma  EXAM: CT HEAD WITHOUT CONTRAST  TECHNIQUE: Contiguous axial images were obtained from the base of the skull through the vertex without intravenous contrast.  COMPARISON:  CT brain scan of 12/15/2014  FINDINGS: The ventricular system remains slightly prominent with mild cortical atrophy present as well. The septum is midline in position. No hemorrhage, mass  lesion, or acute infarction is seen. On bone window images, no calvarial abnormality is seen. The paranasal sinuses are pneumatized.  IMPRESSION: No acute intracranial abnormality. Little change in mild cortical atrophy.   Electronically Signed   By: Ivar Drape M.D.   On: 02/19/2015 16:20   Mr Jeri Cos AX Contrast  02/20/2015   CLINICAL DATA:  Occasional headaches with blurred vision. Confusion/disorientation. Generalize weakness. History of prior strokes. History of chronic pancreatitis and possible pancreatic cancer. Evaluation for carcinomatous meningitis.  EXAM: MRI HEAD WITHOUT AND WITH CONTRAST  TECHNIQUE: Multiplanar, multiecho pulse sequences of the brain and surrounding structures were obtained without and with intravenous contrast.  CONTRAST:  63mL MULTIHANCE GADOBENATE DIMEGLUMINE 529 MG/ML IV SOLN  COMPARISON:  Head CT 02/19/2015  FINDINGS: There is no evidence of acute infarct, intracranial hemorrhage, mass, midline shift, or extra-axial fluid collection. There is mild generalized cerebral atrophy. Several small foci of T2 hyperintensity in the subcortical and deep cerebral white matter are nonspecific but may reflect minimal chronic small vessel ischemic disease. No abnormal enhancement is identified.  Paranasal sinuses are clear. There is a trace right mastoid effusion. Major intracranial vascular flow voids are preserved. Orbits are  unremarkable.  IMPRESSION: 1. No evidence of acute intracranial abnormality or metastases. 2. Mild cerebral atrophy in minimal chronic small vessel ischemic disease.   Electronically Signed   By: Logan Bores   On: 02/20/2015 13:37     Assessment/Plan: 59 y/o male with two prior admission of hypoactive/hyperactive delirium presented with confusion/disorientation, inability to ambulate, generalize weakness.  Mental status appears to have improved MRI brain, no leptomeningeal enhancement or metastasis. D/c planning Leotis Pain   02/20/2015, 3:55 PM

## 2015-05-27 ENCOUNTER — Emergency Department: Payer: Medicare Other

## 2015-05-27 ENCOUNTER — Emergency Department
Admission: EM | Admit: 2015-05-27 | Discharge: 2015-06-01 | Disposition: A | Discharge status: Home / Self Care | Payer: Medicare Other | Attending: Emergency Medicine | Admitting: Emergency Medicine

## 2015-05-27 ENCOUNTER — Encounter: Payer: Self-pay | Admitting: Emergency Medicine

## 2015-05-27 DIAGNOSIS — R109 Unspecified abdominal pain: Secondary | ICD-10-CM | POA: Diagnosis not present

## 2015-05-27 DIAGNOSIS — Z87891 Personal history of nicotine dependence: Secondary | ICD-10-CM | POA: Insufficient documentation

## 2015-05-27 DIAGNOSIS — Z79899 Other long term (current) drug therapy: Secondary | ICD-10-CM | POA: Diagnosis not present

## 2015-05-27 DIAGNOSIS — R52 Pain, unspecified: Secondary | ICD-10-CM

## 2015-05-27 DIAGNOSIS — Z7902 Long term (current) use of antithrombotics/antiplatelets: Secondary | ICD-10-CM | POA: Insufficient documentation

## 2015-05-27 DIAGNOSIS — Z794 Long term (current) use of insulin: Secondary | ICD-10-CM | POA: Insufficient documentation

## 2015-05-27 DIAGNOSIS — I10 Essential (primary) hypertension: Secondary | ICD-10-CM | POA: Diagnosis not present

## 2015-05-27 DIAGNOSIS — R197 Diarrhea, unspecified: Secondary | ICD-10-CM

## 2015-05-27 DIAGNOSIS — Z880 Allergy status to penicillin: Secondary | ICD-10-CM | POA: Insufficient documentation

## 2015-05-27 DIAGNOSIS — R1011 Right upper quadrant pain: Secondary | ICD-10-CM

## 2015-05-27 DIAGNOSIS — E119 Type 2 diabetes mellitus without complications: Secondary | ICD-10-CM | POA: Diagnosis not present

## 2015-05-27 LAB — CBC WITH DIFFERENTIAL/PLATELET
Basophils Absolute: 0 10*3/uL (ref 0–0.1)
Basophils Relative: 1 %
EOS PCT: 1 %
Eosinophils Absolute: 0 10*3/uL (ref 0–0.7)
HEMATOCRIT: 41.5 % (ref 40.0–52.0)
Hemoglobin: 13.9 g/dL (ref 13.0–18.0)
LYMPHS PCT: 32 %
Lymphs Abs: 2.3 10*3/uL (ref 1.0–3.6)
MCH: 31.5 pg (ref 26.0–34.0)
MCHC: 33.6 g/dL (ref 32.0–36.0)
MCV: 93.8 fL (ref 80.0–100.0)
MONO ABS: 0.6 10*3/uL (ref 0.2–1.0)
MONOS PCT: 8 %
NEUTROS ABS: 4.3 10*3/uL (ref 1.4–6.5)
Neutrophils Relative %: 58 %
PLATELETS: 166 10*3/uL (ref 150–440)
RBC: 4.42 MIL/uL (ref 4.40–5.90)
RDW: 13.4 % (ref 11.5–14.5)
WBC: 7.2 10*3/uL (ref 3.8–10.6)

## 2015-05-27 LAB — COMPREHENSIVE METABOLIC PANEL
ALBUMIN: 3.7 g/dL (ref 3.5–5.0)
ALK PHOS: 165 U/L — AB (ref 38–126)
ALT: 58 U/L (ref 17–63)
ANION GAP: 5 (ref 5–15)
AST: 53 U/L — AB (ref 15–41)
BILIRUBIN TOTAL: 0.7 mg/dL (ref 0.3–1.2)
BUN: 10 mg/dL (ref 6–20)
CALCIUM: 8.3 mg/dL — AB (ref 8.9–10.3)
CO2: 25 mmol/L (ref 22–32)
Chloride: 107 mmol/L (ref 101–111)
Creatinine, Ser: 0.77 mg/dL (ref 0.61–1.24)
GFR calc Af Amer: >60 mL/min (ref >60)
GFR calc non Af Amer: >60 mL/min (ref >60)
GLUCOSE: 309 mg/dL — AB (ref 65–99)
Potassium: 3.5 mmol/L (ref 3.5–5.1)
SODIUM: 137 mmol/L (ref 135–145)
Total Protein: 6.3 g/dL — ABNORMAL LOW (ref 6.5–8.1)

## 2015-05-27 LAB — C DIFFICILE QUICK SCREEN W PCR REFLEX
C Diff antigen: NEGATIVE
C Diff interpretation: NEGATIVE
C Diff toxin: NEGATIVE

## 2015-05-27 MED ORDER — IOHEXOL 300 MG/ML  SOLN
100.0000 mL | Freq: Once | INTRAMUSCULAR | Status: AC | PRN
  Administered 2015-05-27: 100 mL via INTRAVENOUS

## 2015-05-27 MED ORDER — IOHEXOL 240 MG/ML SOLN
25.0000 mL | Freq: Once | INTRAMUSCULAR | Status: AC | PRN
  Administered 2015-05-27: 25 mL via ORAL

## 2015-05-27 MED ORDER — DIPHENOXYLATE-ATROPINE 2.5-0.025 MG PO TABS: 1.0000 | ORAL_TABLET | Freq: Four times a day (QID) | ORAL | Status: AC | PRN

## 2015-05-27 NOTE — ED Provider Notes (Addendum)
[facility] Emergency Department Provider Note  ____________________________________________  Time seen: Approximately 6:00 PM  I have reviewed the triage vital signs and the nursing notes.   HISTORY  Chief Complaint Diarrhea    HPI Maurice Patterson is a 59 y.o. male patient began having diarrhea Sunday. Patient has had 3 more episodes of very loose runny diarrhea every day since then. Last diarrheal stool here in the emergency room was just soft and mushy though. Patient reports bad cramps before hand. Patient does not have fever with it. No vomiting. Nothing seems to make the diarrhea or the cramps better or worse.   Past Medical History  Diagnosis Date  . Seizures ([place])   . Memory loss   . Arthritis   . Stroke ([place])   . Hypertension   . Diabetes mellitus without complication ([place])   . Malignant intraductal papillary mucinous tumor of pancreas ([place])   . Hernia of abdominal cavity   . Degenerative lumbar disc   . Acute MI [facility])     Patient Active Problem List   Diagnosis Date Noted  . Altered mental status 02/19/2015    Past Surgical History  Procedure Laterality Date  . Left arm metal plate    . Stent times 2  2013    Cardiac  . Left shoulder surgery      Current Outpatient Rx  Name  Route  Sig  Dispense  Refill  . Acetaminophen 500 MG coapsule   Oral   Take 500 mg by mouth 2 (two) times daily.         . capsicum oleoresin (TRIXAICIN) 0.025 % cream   Topical   Apply 1 application topically 2 (two) times daily.         . cholestyramine light (PREVALITE) 4 GM/DOSE powder   Oral   Take 4 g by mouth 6 (six) times daily.         . clopidogrel (PLAVIX) 75 MG tablet   Oral   Take 1 tablet by mouth daily.         . cyclobenzaprine (FLEXERIL) 10 MG tablet   Oral   Take 1 tablet by mouth 3 (three) times daily as needed for muscle spasms.          . furosemide (LASIX) 40 MG tablet   Oral   Take 1 tablet by mouth daily.          . insulin glargine (LANTUS) 100 UNIT/ML injection   Subcutaneous   Inject 75 Units into the skin at bedtime.         . insulin lispro (HUMALOG) 100 UNIT/ML KiwkPen   Subcutaneous   Inject 2-8 Units into the skin 3 (three) times daily. Per sliding scale         . lamoTRIgine (LAMICTAL) 25 MG tablet   Oral   Take 1-8 tablets by mouth 2 (two) times daily. Week 1: 1 tablet at bedtime Week 2: 1 tablet twice daily Week 3: 1 am, 2 pm Week 4: 2 twice daily Week 5: 2 am, 3 pm Week 6: 3 twice daily Week 7: 3 am, 4 pm Week 8: 4 twice daily         . losartan (COZAAR) 100 MG tablet   Oral   Take 1 tablet by mouth daily.         . metoprolol succinate (TOPROL-XL) 25 MG 24 hr tablet   Oral   Take 1 tablet by mouth daily.         Marland Kitchen  nitroGLYCERIN (NITROSTAT) 0.4 MG SL tablet   Sublingual   Place 1 tablet under the tongue every 5 (five) minutes as needed for chest pain.          . potassium chloride (K-DUR) 10 MEQ tablet   Oral   Take 10 mEq by mouth 2 (two) times daily.         . QUEtiapine (SEROQUEL) 50 MG tablet   Oral   Take 1 tablet by mouth daily.           Allergies Hydrocodone; Ambien; Brilinta; and Penicillins  Family History  Problem Relation Age of Onset  . Diabetes Mother   . Hypertension Mother   . Alzheimer's disease Father     Social History Social History  Substance Use Topics  . Smoking status: Former Research scientist (life sciences)  . Smokeless tobacco: None  . Alcohol Use: No    Review of Systems Constitutional: No fever/chills Eyes: No visual changes. ENT: No sore throat. Cardiovascular: Denies chest pain. Respiratory: Denies shortness of breath. Gastrointestinal: See history of present illness  No constipation. Genitourinary: Negative for dysuria. Musculoskeletal: Negative for back pain except for cramps with the diarrhea Skin: Negative for rash. Neurological: Negative for headaches, focal weakness or numbness.  10-point ROS otherwise  negative.  ____________________________________________   PHYSICAL EXAM:  VITAL SIGNS: ED Triage Vitals  Enc Vitals Group     BP 05/27/15 1733 156/86 mmHg     Pulse Rate 05/27/15 1733 93     Resp 05/27/15 1733 18     Temp 05/27/15 1733 97.6 F (36.4 C)     Temp Source 05/27/15 1733 Oral     SpO2 05/27/15 1733 98 %     Weight 05/27/15 1733 185 lb (83.915 kg)     Height 05/27/15 1733 5\' 7"  (1.702 m)     Head Cir --      Peak Flow --      Pain Score --      Pain Loc --      Pain Edu? --      Excl. in [place]? --     Constitutional: Alert and oriented. Well appearing and in no acute distress. Eyes: Conjunctivae are normal. PERRL. EOMI. Head: Atraumatic. Nose: No congestion/rhinnorhea. Mouth/Throat: Mucous membranes are moist.  Oropharynx non-erythematous. Neck: No stridor.  Cardiovascular: Normal rate, regular rhythm. Grossly normal heart sounds.  Good peripheral circulation. Respiratory: Normal respiratory effort.  No retractions. Lungs CTAB. Gastrointestinal: Soft and nontender. No distention. No abdominal bruits. No CVA tenderness. Musculoskeletal: No lower extremity tenderness nor edema.  No joint effusions. Neurologic:  Normal speech and language. No gross focal neurologic deficits are appreciated. No gait instability. Skin:  Skin is warm, dry and intact. No rash noted.   ____________________________________________   LABS (all labs ordered are listed, but only abnormal results are displayed)  Labs Reviewed  COMPREHENSIVE METABOLIC PANEL - Abnormal; Notable for the following:    Glucose, Bld 309 (*)    Calcium 8.3 (*)    Total Protein 6.3 (*)    AST 53 (*)    Alkaline Phosphatase 165 (*)    All other components within normal limits  C DIFFICILE QUICK SCREEN W PCR REFLEX  STOOL CULTURE  CBC WITH DIFFERENTIAL/PLATELET   ____________________________________________  EKG   ____________________________________________  RADIOLOGY  KUB shows some air-fluid  levels and a lot of stool filling the whole colon. Patient's family keeps telling me he's had multiple episodes of diarrhea and crampy pain. Patient's daughter also reports that  she had similar symptoms with her gallbladder I ordered a gallbladder ultrasound. I will also after looking at the plain films order CT of the abdomen. Patient does have a history of a mass on the pancreas which is awaiting treatment ____________________________________________   PROCEDURES    ____________________________________________   INITIAL IMPRESSION / ASSESSMENT AND PLAN / ED COURSE  Pertinent labs & imaging results that were available during my care of the patient were reviewed by me and considered in my medical decision making (see chart for details). I will sign the patient out to Dr. Edd Fabian ____________________________________________   FINAL CLINICAL IMPRESSION(S) / ED DIAGNOSES  Final diagnoses:  Pain  Abdominal pain, unspecified abdominal location      Nena Polio, MD 05/27/15 2126  Rectal exam was done rectal vault was empty  Nena Polio, MD 05/27/15 2146

## 2015-05-27 NOTE — ED Notes (Addendum)
77 yom PMHx seizures (on Lamictal), hypertension, diabetes, Pancreatic CA (pending Whipple procedure), CAD, MI (stents x 2 on plavix) presents via EMS from home with c/c diarrhea x 2 days.   * ENTERIC PRECAUTIONS SIGN AND PPE PLACED ON DOOR *   Stool sample x 2 and urine sent.  Labs (rainbow) sent

## 2015-05-27 NOTE — ED Provider Notes (Signed)
-----------------------------------------   11:05 PM on 05/27/2015 -----------------------------------------  Care was assumed from Dr. Rip Harbour at 10 PM pending results of CT abdomen and pelvis. CT scan shows no acute findings evident in the abdomen or pelvis. There is evidence of cholelithiasis without cholecystitis however the patient does not appear to be in any pain at this time. Discussed the case with Dr. Rayann Heman of GI who recommends Lomotil. He reports the patient would probably be best served with Creon as his diarrhea is likely caused by pancreatic insufficiency however wife reports that the patient is severely allergic to Creon. Discussed return precautions and need for close follow-up. The patient will follow up with Dr. Rochel Brome as needed for cholelithiasis and will follow up with his GI specialist as well.  Joanne Gavel, MD 05/27/15 [phone]

## 2015-05-27 NOTE — ED Notes (Signed)
Pt to ultrasound

## 2015-05-27 NOTE — ED Notes (Signed)
Pt to CT

## 2015-05-30 LAB — STOOL CULTURE: SPECIAL REQUESTS: NORMAL

## 2015-08-08 ENCOUNTER — Ambulatory Visit
Admission: EM | Admit: 2015-08-08 | Discharge: 2015-08-08 | Disposition: A | Discharge status: Home / Self Care | Payer: Medicare Other | Attending: Family Medicine | Admitting: Family Medicine

## 2015-08-08 ENCOUNTER — Ambulatory Visit (INDEPENDENT_AMBULATORY_CARE_PROVIDER_SITE_OTHER): Payer: Medicare Other

## 2015-08-08 DIAGNOSIS — Z87891 Personal history of nicotine dependence: Secondary | ICD-10-CM | POA: Diagnosis not present

## 2015-08-08 DIAGNOSIS — J410 Simple chronic bronchitis: Secondary | ICD-10-CM | POA: Diagnosis not present

## 2015-08-08 MED ORDER — DOXYCYCLINE HYCLATE 100 MG PO TABS: 100.0000 mg | ORAL_TABLET | Freq: Two times a day (BID) | ORAL | Status: DC

## 2015-08-08 MED ORDER — GUAIFENESIN-CODEINE 100-10 MG/5ML PO SOLN: ORAL | Status: DC

## 2015-08-08 MED ORDER — FLUCONAZOLE 150 MG PO TABS: 150.0000 mg | ORAL_TABLET | Freq: Every day | ORAL | Status: DC

## 2015-08-08 NOTE — ED Notes (Signed)
Has Hx of chronic back pain, but worsening lumbar pain in the last 3 days. States has had productive cough x 2 days. Today coughed sputum with "red in it". Denies fever. Also Dx 11/15 with "non malignant mass behind the pancreas".

## 2015-08-08 NOTE — ED Provider Notes (Signed)
CSN: [id number]     Arrival date & time 08/08/15  1051 History   First MD Initiated Contact with Patient 08/08/15 1140     Chief Complaint  Patient presents with  . Back Pain  . Cough   (Consider location/radiation/quality/duration/timing/severity/associated sxs/prior Treatment) Patient is a 59 y.o. male presenting with URI. The history is provided by the patient.  URI Presenting symptoms: congestion, cough, fatigue and fever   Cough:    Cough characteristics:  Productive   Sputum characteristics:  Bloody   Severity:  Moderate   Onset quality:  Sudden   Progression:  Worsening   Chronicity:  Chronic Severity:  Moderate Onset quality:  Sudden Duration:  5 days Timing:  Constant Progression:  Worsening Chronicity:  New Relieved by:  Nothing Risk factors: chronic respiratory disease (former 40 pack year h/o smoking; quit 3 years ago)     Past Medical History  Diagnosis Date  . Seizures ([place])   . Memory loss   . Arthritis   . Stroke ([place])   . Hypertension   . Diabetes mellitus without complication ([place])   . Malignant intraductal papillary mucinous tumor of pancreas ([place])   . Hernia of abdominal cavity   . Degenerative lumbar disc   . Acute MI [facility])    Past Surgical History  Procedure Laterality Date  . Left arm metal plate    . Stent times 2  2013    Cardiac  . Left shoulder surgery     Family History  Problem Relation Age of Onset  . Diabetes Mother   . Hypertension Mother   . Alzheimer's disease Father    Social History  Substance Use Topics  . Smoking status: Former Research scientist (life sciences)  . Smokeless tobacco: None  . Alcohol Use: No    Review of Systems  Constitutional: Positive for fever and fatigue.  HENT: Positive for congestion.   Respiratory: Positive for cough.     Allergies  Hydrocodone; Morphine; Ambien; Brilinta; Levetiracetam; Lorazepam; Trazodone; and Penicillins  Home Medications   Prior to Admission medications   Medication Sig Start Date End  Date Taking? Authorizing Provider  Acetaminophen 500 MG coapsule Take 500 mg by mouth 2 (two) times daily.   Yes Historical Provider, MD  atorvastatin (LIPITOR) 80 MG tablet Take 1 tablet by mouth daily. 03/12/15  Yes Historical Provider, MD  cholestyramine light (PREVALITE) 4 GM/DOSE powder Take 4 g by mouth 6 (six) times daily.   Yes Historical Provider, MD  clopidogrel (PLAVIX) 75 MG tablet Take 1 tablet by mouth daily.   Yes Historical Provider, MD  cyclobenzaprine (FLEXERIL) 10 MG tablet Take 1 tablet by mouth 3 (three) times daily as needed for muscle spasms.    Yes Historical Provider, MD  diphenoxylate-atropine (LOMOTIL) 2.5-0.025 MG tablet Take 1 tablet by mouth 4 (four) times daily as needed for diarrhea or loose stools. 05/27/15 05/26/16 Yes Joanne Gavel, MD  furosemide (LASIX) 40 MG tablet Take 1 tablet by mouth daily.   Yes Historical Provider, MD  insulin aspart (NOVOLOG FLEXPEN) 100 UNIT/ML FlexPen Inject 20 Units into the skin 3 (three) times daily. 03/18/15  Yes Historical Provider, MD  insulin glargine (LANTUS) 100 UNIT/ML injection Inject 75 Units into the skin at bedtime.   Yes Historical Provider, MD  lamoTRIgine (LAMICTAL) 100 MG tablet Take 100 mg by mouth 2 (two) times daily.   Yes Historical Provider, MD  lamoTRIgine (LAMICTAL) 25 MG tablet Take 1-8 tablets by mouth 2 (two) times daily. Week 1: 1  tablet at bedtime Week 2: 1 tablet twice daily Week 3: 1 am, 2 pm Week 4: 2 twice daily Week 5: 2 am, 3 pm Week 6: 3 twice daily Week 7: 3 am, 4 pm Week 8: 4 twice daily 02/04/15  Yes Historical Provider, MD  losartan (COZAAR) 100 MG tablet Take 1 tablet by mouth daily. 04/28/14  Yes Historical Provider, MD  metoprolol (LOPRESSOR) 50 MG tablet Take 1 tablet by mouth 3 (three) times daily. 05/07/15  Yes Historical Provider, MD  mirtazapine (REMERON) 30 MG tablet Take 1 tablet by mouth at bedtime. 03/09/15  Yes Historical Provider, MD  omeprazole (PRILOSEC) 20 MG capsule Take 1 capsule  by mouth daily. 05/20/15  Yes Historical Provider, MD  pantoprazole (PROTONIX) 40 MG tablet Take 1 tablet by mouth daily. 05/01/15  Yes Historical Provider, MD  potassium chloride (K-DUR) 10 MEQ tablet Take 10 mEq by mouth 2 (two) times daily.   Yes Historical Provider, MD  capsicum oleoresin (TRIXAICIN) 0.025 % cream Apply 1 application topically 2 (two) times daily.    Historical Provider, MD  doxycycline (VIBRA-TABS) 100 MG tablet Take 1 tablet (100 mg total) by mouth 2 (two) times daily. 08/08/15   Norval Gable, MD  fluconazole (DIFLUCAN) 150 MG tablet Take 1 tablet (150 mg total) by mouth daily. 08/08/15   Norval Gable, MD  gabapentin (NEURONTIN) 300 MG capsule Take 1 capsule by mouth 3 (three) times daily. 04/09/15   Historical Provider, MD  guaiFENesin-codeine 100-10 MG/5ML syrup 5-10 ml po qhs prn 08/08/15   Norval Gable, MD  insulin lispro (HUMALOG) 100 UNIT/ML KiwkPen Inject 2-8 Units into the skin 3 (three) times daily. Per sliding scale    Historical Provider, MD  lamoTRIgine (LAMICTAL) 25 MG tablet Take 25 mg by mouth See admin instructions. TK 125 MG BID    Historical Provider, MD  metoprolol succinate (TOPROL-XL) 25 MG 24 hr tablet Take 1 tablet by mouth daily.    Historical Provider, MD  nitroGLYCERIN (NITROSTAT) 0.4 MG SL tablet Place 1 tablet under the tongue every 5 (five) minutes as needed for chest pain.     Historical Provider, MD  QUEtiapine (SEROQUEL) 50 MG tablet Take 1 tablet by mouth daily.    Historical Provider, MD   Meds Ordered and Administered this Visit  Medications - No data to display  BP 132/78 mmHg  Pulse 88  Temp(Src) 98.1 F (36.7 C) (Tympanic)  Resp 20  Ht 5\' 7"  (1.702 m)  Wt 169 lb (76.658 kg)  BMI 26.46 kg/m2  SpO2 99% No data found.   Physical Exam  Constitutional: He appears well-developed and well-nourished. No distress.  HENT:  Head: Normocephalic and atraumatic.  Right Ear: Tympanic membrane, external ear and ear canal normal.  Left Ear:  Tympanic membrane, external ear and ear canal normal.  Nose: Nose normal.  Mouth/Throat: Uvula is midline, oropharynx is clear and moist and mucous membranes are normal. No oropharyngeal exudate or tonsillar abscesses.  Eyes: Conjunctivae and EOM are normal. Pupils are equal, round, and reactive to light. Right eye exhibits no discharge. Left eye exhibits no discharge. No scleral icterus.  Neck: Normal range of motion. Neck supple. No tracheal deviation present. No thyromegaly present.  Cardiovascular: Normal rate, regular rhythm and normal heart sounds.   Pulmonary/Chest: Effort normal. No stridor. No respiratory distress. He has no wheezes. He has no rales. He exhibits no tenderness.  Rhonchi bases bilaterally  Lymphadenopathy:    He has no cervical adenopathy.  Neurological: He  is alert.  Skin: Skin is warm and dry. No rash noted. He is not diaphoretic.  Nursing note and vitals reviewed.   ED Course  Procedures (including critical care time)  Labs Review Labs Reviewed - No data to display  Imaging Review Dg Chest 2 View  08/08/2015  CLINICAL DATA:  Cough with hemoptysis for 2 days with short of breath on exertion. Chest pain. EXAM: CHEST  2 VIEW COMPARISON:  02/19/2015 FINDINGS: Hyperinflation. Suspect remote right rib trauma with overlying pleural thickening laterally. Not readily apparent on the prior exam. Midline trachea. Normal heart size and mediastinal contours. No pleural effusion or pneumothorax. Probable nipple shadow projecting over the right lung base. IMPRESSION: Hyperinflation, without acute disease. Suspicion of remote right rib trauma with overlying pleural thickening. Consider further evaluation with dedicated rib radiographs. Alternatively, especially if there has not been interval rib trauma, given the smoking history, CT may be informative. Probable right-sided nipple shadow. Repeat frontal radiograph with nipple markers could confirm. Electronically Signed   By: Abigail Miyamoto M.D.   On: 08/08/2015 12:08     Visual Acuity Review  Right Eye Distance:   Left Eye Distance:   Bilateral Distance:    Right Eye Near:   Left Eye Near:    Bilateral Near:         MDM   1. Simple chronic bronchitis ([place])   2. Former smoker    Discharge Medication List as of 08/08/2015 12:41 PM    START taking these medications   Details  doxycycline (VIBRA-TABS) 100 MG tablet Take 1 tablet (100 mg total) by mouth 2 (two) times daily., Starting 08/08/2015, Until Discontinued, Normal    fluconazole (DIFLUCAN) 150 MG tablet Take 1 tablet (150 mg total) by mouth daily., Starting 08/08/2015, Until Discontinued, Normal    guaiFENesin-codeine 100-10 MG/5ML syrup 5-10 ml po qhs prn, Print      1. Labs/x-ray results and diagnosis reviewed with patient/parent/guardian/family 2. rx as per orders above; reviewed possible side effects, interactions, risks and benefits  3. Recommend supportive treatment with rest, increased fluids, otc analgesics prn 4. Follow-up prn if symptoms worsen or don't improve  Norval Gable, MD 08/08/15 2046

## 2015-08-21 ENCOUNTER — Emergency Department: Payer: Medicare Other

## 2015-08-21 ENCOUNTER — Encounter: Payer: Self-pay | Admitting: Emergency Medicine

## 2015-08-21 ENCOUNTER — Inpatient Hospital Stay
Admission: EM | Admit: 2015-08-21 | Discharge: 2015-08-23 | DRG: 391 | Disposition: A | Discharge status: Home / Self Care | Payer: Medicare Other | Attending: Internal Medicine | Admitting: Internal Medicine

## 2015-08-21 DIAGNOSIS — Z888 Allergy status to other drugs, medicaments and biological substances status: Secondary | ICD-10-CM

## 2015-08-21 DIAGNOSIS — F05 Delirium due to known physiological condition: Secondary | ICD-10-CM | POA: Diagnosis not present

## 2015-08-21 DIAGNOSIS — Z7902 Long term (current) use of antithrombotics/antiplatelets: Secondary | ICD-10-CM | POA: Diagnosis not present

## 2015-08-21 DIAGNOSIS — Z880 Allergy status to penicillin: Secondary | ICD-10-CM

## 2015-08-21 DIAGNOSIS — Z8673 Personal history of transient ischemic attack (TIA), and cerebral infarction without residual deficits: Secondary | ICD-10-CM | POA: Diagnosis not present

## 2015-08-21 DIAGNOSIS — Z7982 Long term (current) use of aspirin: Secondary | ICD-10-CM

## 2015-08-21 DIAGNOSIS — J189 Pneumonia, unspecified organism: Secondary | ICD-10-CM

## 2015-08-21 DIAGNOSIS — R4182 Altered mental status, unspecified: Secondary | ICD-10-CM

## 2015-08-21 DIAGNOSIS — Z792 Long term (current) use of antibiotics: Secondary | ICD-10-CM

## 2015-08-21 DIAGNOSIS — E119 Type 2 diabetes mellitus without complications: Secondary | ICD-10-CM | POA: Diagnosis present

## 2015-08-21 DIAGNOSIS — K861 Other chronic pancreatitis: Secondary | ICD-10-CM | POA: Diagnosis present

## 2015-08-21 DIAGNOSIS — R109 Unspecified abdominal pain: Secondary | ICD-10-CM | POA: Diagnosis present

## 2015-08-21 DIAGNOSIS — E1165 Type 2 diabetes mellitus with hyperglycemia: Secondary | ICD-10-CM | POA: Diagnosis present

## 2015-08-21 DIAGNOSIS — I252 Old myocardial infarction: Secondary | ICD-10-CM | POA: Diagnosis not present

## 2015-08-21 DIAGNOSIS — Z79899 Other long term (current) drug therapy: Secondary | ICD-10-CM

## 2015-08-21 DIAGNOSIS — Z87891 Personal history of nicotine dependence: Secondary | ICD-10-CM

## 2015-08-21 DIAGNOSIS — G40909 Epilepsy, unspecified, not intractable, without status epilepticus: Secondary | ICD-10-CM | POA: Diagnosis present

## 2015-08-21 DIAGNOSIS — R1033 Periumbilical pain: Principal | ICD-10-CM | POA: Diagnosis present

## 2015-08-21 DIAGNOSIS — Z794 Long term (current) use of insulin: Secondary | ICD-10-CM

## 2015-08-21 DIAGNOSIS — D490 Neoplasm of unspecified behavior of digestive system: Secondary | ICD-10-CM | POA: Diagnosis present

## 2015-08-21 DIAGNOSIS — K5900 Constipation, unspecified: Secondary | ICD-10-CM | POA: Diagnosis present

## 2015-08-21 DIAGNOSIS — Z885 Allergy status to narcotic agent status: Secondary | ICD-10-CM | POA: Diagnosis not present

## 2015-08-21 DIAGNOSIS — I10 Essential (primary) hypertension: Secondary | ICD-10-CM | POA: Diagnosis present

## 2015-08-21 DIAGNOSIS — J690 Pneumonitis due to inhalation of food and vomit: Secondary | ICD-10-CM | POA: Diagnosis present

## 2015-08-21 DIAGNOSIS — J181 Lobar pneumonia, unspecified organism: Secondary | ICD-10-CM

## 2015-08-21 HISTORY — DX: Transient cerebral ischemic attack, unspecified: G45.9

## 2015-08-21 LAB — COMPREHENSIVE METABOLIC PANEL
ALBUMIN: 4.3 g/dL (ref 3.5–5.0)
ALK PHOS: 225 U/L — AB (ref 38–126)
ALT: 44 U/L (ref 17–63)
AST: 23 U/L (ref 15–41)
Anion gap: 9 (ref 5–15)
BILIRUBIN TOTAL: 0.8 mg/dL (ref 0.3–1.2)
BUN: 14 mg/dL (ref 6–20)
CALCIUM: 9.1 mg/dL (ref 8.9–10.3)
CO2: 25 mmol/L (ref 22–32)
Chloride: 100 mmol/L — ABNORMAL LOW (ref 101–111)
Creatinine, Ser: 0.72 mg/dL (ref 0.61–1.24)
GFR calc Af Amer: >60 mL/min (ref >60)
GFR calc non Af Amer: >60 mL/min (ref >60)
GLUCOSE: 489 mg/dL — AB (ref 65–99)
Potassium: 3.8 mmol/L (ref 3.5–5.1)
Sodium: 134 mmol/L — ABNORMAL LOW (ref 135–145)
TOTAL PROTEIN: 7.1 g/dL (ref 6.5–8.1)

## 2015-08-21 LAB — URINALYSIS COMPLETE WITH MICROSCOPIC (ARMC ONLY)
BACTERIA UA: NONE SEEN
BILIRUBIN URINE: NEGATIVE
Glucose, UA: >500 mg/dL — AB
Hgb urine dipstick: NEGATIVE
LEUKOCYTES UA: NEGATIVE
Nitrite: NEGATIVE
PH: 6 (ref 5.0–8.0)
PROTEIN: NEGATIVE mg/dL
SQUAMOUS EPITHELIAL / LPF: NONE SEEN
Specific Gravity, Urine: 1.032 — ABNORMAL HIGH (ref 1.005–1.030)

## 2015-08-21 LAB — CBC WITH DIFFERENTIAL/PLATELET
Basophils Absolute: 0 10*3/uL (ref 0–0.1)
Basophils Relative: 0 %
EOS ABS: 0.1 10*3/uL (ref 0–0.7)
Eosinophils Relative: 2 %
HCT: 44 % (ref 40.0–52.0)
HEMOGLOBIN: 14.7 g/dL (ref 13.0–18.0)
LYMPHS ABS: 1.9 10*3/uL (ref 1.0–3.6)
Lymphocytes Relative: 28 %
MCH: 31.7 pg (ref 26.0–34.0)
MCHC: 33.4 g/dL (ref 32.0–36.0)
MCV: 94.9 fL (ref 80.0–100.0)
MONOS PCT: 9 %
Monocytes Absolute: 0.6 10*3/uL (ref 0.2–1.0)
NEUTROS PCT: 61 %
Neutro Abs: 4.1 10*3/uL (ref 1.4–6.5)
Platelets: 173 10*3/uL (ref 150–440)
RBC: 4.64 MIL/uL (ref 4.40–5.90)
RDW: 13.6 % (ref 11.5–14.5)
WBC: 6.8 10*3/uL (ref 3.8–10.6)

## 2015-08-21 LAB — GLUCOSE, CAPILLARY
GLUCOSE-CAPILLARY: 428 mg/dL — AB (ref 65–99)
GLUCOSE-CAPILLARY: 301 mg/dL — AB (ref 65–99)
Glucose-Capillary: 483 mg/dL — ABNORMAL HIGH (ref 65–99)
Glucose-Capillary: 293 mg/dL — ABNORMAL HIGH (ref 65–99)

## 2015-08-21 LAB — LIPASE, BLOOD: LIPASE: 11 U/L (ref 11–51)

## 2015-08-21 LAB — TROPONIN I: Troponin I: <0.03 ng/mL (ref <0.031)

## 2015-08-21 LAB — LACTIC ACID, PLASMA: Lactic Acid, Venous: 1.8 mmol/L (ref 0.5–2.0)

## 2015-08-21 MED ORDER — METOPROLOL SUCCINATE ER 25 MG PO TB24
25.0000 mg | ORAL_TABLET | Freq: Every day | ORAL | Status: DC
  Administered 2015-08-22 – 2015-08-23 (×2): 25 mg via ORAL
  Filled 2015-08-21 (×2): qty 1

## 2015-08-21 MED ORDER — ATORVASTATIN CALCIUM 20 MG PO TABS
80.0000 mg | ORAL_TABLET | Freq: Every day | ORAL | Status: DC
  Administered 2015-08-21 – 2015-08-23 (×3): 80 mg via ORAL
  Filled 2015-08-21 (×3): qty 4

## 2015-08-21 MED ORDER — LAMOTRIGINE 100 MG PO TABS
100.0000 mg | ORAL_TABLET | Freq: Two times a day (BID) | ORAL | Status: DC
  Administered 2015-08-21 – 2015-08-23 (×3): 100 mg via ORAL
  Filled 2015-08-21 (×5): qty 1

## 2015-08-21 MED ORDER — SODIUM CHLORIDE 0.9 % IV BOLUS (SEPSIS)
1000.0000 mL | Freq: Once | INTRAVENOUS | Status: AC
  Administered 2015-08-21: 1000 mL via INTRAVENOUS

## 2015-08-21 MED ORDER — INSULIN ASPART 100 UNIT/ML ~~LOC~~ SOLN
10.0000 [IU] | Freq: Three times a day (TID) | SUBCUTANEOUS | Status: DC
  Administered 2015-08-22 – 2015-08-23 (×4): 10 [IU] via SUBCUTANEOUS
  Filled 2015-08-21 (×3): qty 10

## 2015-08-21 MED ORDER — ACETAMINOPHEN 325 MG PO TABS
650.0000 mg | ORAL_TABLET | Freq: Once | ORAL | Status: AC
  Administered 2015-08-21: 650 mg via ORAL

## 2015-08-21 MED ORDER — DIPHENOXYLATE-ATROPINE 2.5-0.025 MG PO TABS
1.0000 | ORAL_TABLET | Freq: Once | ORAL | Status: AC
  Administered 2015-08-21: 1 via ORAL
  Filled 2015-08-21: qty 1

## 2015-08-21 MED ORDER — DIPHENOXYLATE-ATROPINE 2.5-0.025 MG PO TABS
1.0000 | ORAL_TABLET | Freq: Four times a day (QID) | ORAL | Status: DC | PRN
  Administered 2015-08-21: 1 via ORAL
  Filled 2015-08-21: qty 1

## 2015-08-21 MED ORDER — INSULIN ASPART 100 UNIT/ML ~~LOC~~ SOLN
0.0000 [IU] | Freq: Every day | SUBCUTANEOUS | Status: DC
  Administered 2015-08-21: 22:00:00 4 [IU] via SUBCUTANEOUS
  Administered 2015-08-22: 3 [IU] via SUBCUTANEOUS
  Filled 2015-08-21: qty 4
  Filled 2015-08-21: qty 3

## 2015-08-21 MED ORDER — INSULIN ASPART 100 UNIT/ML FLEXPEN: 10.0000 [IU] | PEN_INJECTOR | Freq: Three times a day (TID) | SUBCUTANEOUS | Status: DC

## 2015-08-21 MED ORDER — CLINDAMYCIN PHOSPHATE 600 MG/50ML IV SOLN
600.0000 mg | Freq: Once | INTRAVENOUS | Status: AC
  Administered 2015-08-21: 600 mg via INTRAVENOUS
  Filled 2015-08-21: qty 50

## 2015-08-21 MED ORDER — LOSARTAN POTASSIUM 50 MG PO TABS
100.0000 mg | ORAL_TABLET | Freq: Every day | ORAL | Status: DC
  Administered 2015-08-22 – 2015-08-23 (×2): 100 mg via ORAL
  Filled 2015-08-21 (×2): qty 2

## 2015-08-21 MED ORDER — PANTOPRAZOLE SODIUM 40 MG PO TBEC
40.0000 mg | DELAYED_RELEASE_TABLET | Freq: Every day | ORAL | Status: DC
  Administered 2015-08-22 – 2015-08-23 (×2): 40 mg via ORAL
  Filled 2015-08-21 (×2): qty 1

## 2015-08-21 MED ORDER — ACETAMINOPHEN 325 MG PO TABS
ORAL_TABLET | ORAL | Status: AC
  Administered 2015-08-21: 650 mg via ORAL
  Filled 2015-08-21: qty 2

## 2015-08-21 MED ORDER — SODIUM CHLORIDE 0.9 % IJ SOLN
3.0000 mL | Freq: Two times a day (BID) | INTRAMUSCULAR | Status: DC
  Administered 2015-08-21 – 2015-08-22 (×3): 3 mL via INTRAVENOUS

## 2015-08-21 MED ORDER — HEPARIN SODIUM (PORCINE) 5000 UNIT/ML IJ SOLN
5000.0000 [IU] | Freq: Three times a day (TID) | INTRAMUSCULAR | Status: DC
Start: 2015-08-21 — End: 2015-08-23
  Administered 2015-08-21 – 2015-08-23 (×4): 5000 [IU] via SUBCUTANEOUS
  Filled 2015-08-21 (×4): qty 1

## 2015-08-21 MED ORDER — INSULIN ASPART 100 UNIT/ML ~~LOC~~ SOLN
5.0000 [IU] | Freq: Once | SUBCUTANEOUS | Status: AC
  Administered 2015-08-21: 5 [IU] via INTRAVENOUS
  Filled 2015-08-21: qty 5

## 2015-08-21 MED ORDER — INSULIN GLARGINE 100 UNIT/ML ~~LOC~~ SOLN
75.0000 [IU] | Freq: Every day | SUBCUTANEOUS | Status: DC
  Administered 2015-08-21: 22:00:00 75 [IU] via SUBCUTANEOUS
  Filled 2015-08-21 (×2): qty 0.75

## 2015-08-21 MED ORDER — ACETAMINOPHEN 650 MG RE SUPP: 650.0000 mg | Freq: Four times a day (QID) | RECTAL | Status: DC | PRN

## 2015-08-21 MED ORDER — ACETAMINOPHEN 325 MG PO TABS
650.0000 mg | ORAL_TABLET | Freq: Four times a day (QID) | ORAL | Status: DC | PRN
  Administered 2015-08-21 – 2015-08-22 (×4): 650 mg via ORAL
  Filled 2015-08-21 (×4): qty 2

## 2015-08-21 MED ORDER — MIRTAZAPINE 15 MG PO TABS: 30.0000 mg | ORAL_TABLET | Freq: Every day | ORAL | Status: DC

## 2015-08-21 MED ORDER — IOHEXOL 240 MG/ML SOLN
25.0000 mg | Freq: Once | INTRAMUSCULAR | Status: AC | PRN
  Administered 2015-08-21: 25 mL via ORAL

## 2015-08-21 MED ORDER — INSULIN ASPART 100 UNIT/ML ~~LOC~~ SOLN
10.0000 [IU] | SUBCUTANEOUS | Status: AC
  Administered 2015-08-21: 10 [IU] via INTRAVENOUS
  Filled 2015-08-21: qty 10

## 2015-08-21 MED ORDER — INSULIN ASPART 100 UNIT/ML ~~LOC~~ SOLN
0.0000 [IU] | Freq: Three times a day (TID) | SUBCUTANEOUS | Status: DC
  Administered 2015-08-22: 7 [IU] via SUBCUTANEOUS
  Administered 2015-08-22 (×2): 9 [IU] via SUBCUTANEOUS
  Administered 2015-08-23: 09:00:00 1 [IU] via SUBCUTANEOUS
  Filled 2015-08-21: qty 9
  Filled 2015-08-21: qty 1
  Filled 2015-08-21: qty 7
  Filled 2015-08-21: qty 9

## 2015-08-21 MED ORDER — CLOPIDOGREL BISULFATE 75 MG PO TABS
75.0000 mg | ORAL_TABLET | Freq: Every day | ORAL | Status: DC
  Administered 2015-08-22 – 2015-08-23 (×2): 75 mg via ORAL
  Filled 2015-08-21 (×2): qty 1

## 2015-08-21 MED ORDER — IOHEXOL 300 MG/ML  SOLN
100.0000 mL | Freq: Once | INTRAMUSCULAR | Status: AC | PRN
  Administered 2015-08-21: 100 mL via INTRAVENOUS

## 2015-08-21 NOTE — ED Notes (Signed)
Pt given sandwich. Ok per dr Cinda Quest

## 2015-08-21 NOTE — ED Notes (Signed)
Pt c/o lower abdominal pain and lower back pain off and on for a year that flared up today.  Wife also reports he has been confused.

## 2015-08-21 NOTE — ED Provider Notes (Addendum)
[facility] Emergency Department Provider Note  ____________________________________________  Time seen: Approximately 4:25 PM  I have reviewed the triage vital signs and the nursing notes.   HISTORY  Chief Complaint Abdominal Pain    HPI Maurice Patterson is a 59 y.o. male history obtained mostly from EMS and patient's wife. The patient did contribute some. Wife reports she was at work when her son called her son and the patient is very restless and in pain. The pain seemed to be from the chest following at the lower abdomen. Patient's speech was very slurry. Difficult to understand. EMS was called and will make got there pain seemed to localize to the lower abdomen. Patient reportedly still had some slurry speech. Emergency room patient's speech was clear but he was very slow and his actions. He seemed to have mostly lower abdominal pain. Patient has a history of seizures memory loss stroke hypertension diabetes. Patient's wife reports he has taken all of his medicines today except for his insulin.  Past Medical History  Diagnosis Date  . Seizures ([place])   . Memory loss   . Arthritis   . Stroke ([place])   . Hypertension   . Diabetes mellitus without complication ([place])   . Malignant intraductal papillary mucinous tumor of pancreas ([place])   . Hernia of abdominal cavity   . Degenerative lumbar disc   . Acute MI [facility])     Patient Active Problem List   Diagnosis Date Noted  . Altered mental status 02/19/2015    Past Surgical History  Procedure Laterality Date  . Left arm metal plate    . Stent times 2  2013    Cardiac  . Left shoulder surgery      Current Outpatient Rx  Name  Route  Sig  Dispense  Refill  . atorvastatin (LIPITOR) 80 MG tablet   Oral   Take 1 tablet by mouth daily.      0   . clopidogrel (PLAVIX) 75 MG tablet   Oral   Take 1 tablet by mouth daily.         . diphenoxylate-atropine (LOMOTIL) 2.5-0.025 MG tablet   Oral   Take 1  tablet by mouth 4 (four) times daily as needed for diarrhea or loose stools.   30 tablet   1   . furosemide (LASIX) 40 MG tablet   Oral   Take 1 tablet by mouth daily.         . insulin aspart (NOVOLOG FLEXPEN) 100 UNIT/ML FlexPen   Subcutaneous   Inject 30 Units into the skin 3 (three) times daily.          . insulin glargine (LANTUS) 100 UNIT/ML injection   Subcutaneous   Inject 75 Units into the skin at bedtime.         . lamoTRIgine (LAMICTAL) 100 MG tablet   Oral   Take 100 mg by mouth 2 (two) times daily.         Marland Kitchen losartan (COZAAR) 100 MG tablet   Oral   Take 1 tablet by mouth daily.         . metoprolol succinate (TOPROL-XL) 25 MG 24 hr tablet   Oral   Take 1 tablet by mouth daily.         . mirtazapine (REMERON) 30 MG tablet   Oral   Take 1 tablet by mouth at bedtime.      1   . nitroGLYCERIN (NITROSTAT) 0.4 MG SL tablet  Sublingual   Place 1 tablet under the tongue every 5 (five) minutes as needed for chest pain.          Marland Kitchen omeprazole (PRILOSEC) 20 MG capsule   Oral   Take 1 capsule by mouth daily.      11   . potassium chloride (K-DUR) 10 MEQ tablet   Oral   Take 10 mEq by mouth 2 (two) times daily.         . Acetaminophen 500 MG coapsule   Oral   Take 500 mg by mouth 2 (two) times daily.         Marland Kitchen doxycycline (VIBRA-TABS) 100 MG tablet   Oral   Take 1 tablet (100 mg total) by mouth 2 (two) times daily. Patient not taking: Reported on 08/21/2015   20 tablet   0   . fluconazole (DIFLUCAN) 150 MG tablet   Oral   Take 1 tablet (150 mg total) by mouth daily. Patient not taking: Reported on 08/21/2015   1 tablet   0   . guaiFENesin-codeine 100-10 MG/5ML syrup      5-10 ml po qhs prn Patient not taking: Reported on 08/21/2015   120 mL   0     Allergies Hydrocodone; Morphine; Ambien; Brilinta; Levetiracetam; Lorazepam; Trazodone; and Penicillins  Family History  Problem Relation Age of Onset  . Diabetes Mother   .  Hypertension Mother   . Alzheimer's disease Father     Social History Social History  Substance Use Topics  . Smoking status: Former Research scientist (life sciences)  . Smokeless tobacco: None  . Alcohol Use: No    Review of Systems Constitutional: No fever/chills Eyes: No visual changes. ENT: No sore throat. Cardiovascular: Denies chest pain. Respiratory: Denies shortness of breath. Gastrointestinal abdominal pain.  No nausea, no vomiting.  No diarrhea.  No constipation. Genitourinary: Negative for dysuria. Musculoskeletal: Patient has history of back pain was having some back pain here. Skin: Negative for rash. Neurological: Negative for headaches, focal weakness or numbness. Patient reports and his wife confirms that he is not quite back to baseline as far as his actions.  10-point ROS otherwise negative.  ____________________________________________   PHYSICAL EXAM:  VITAL SIGNS: ED Triage Vitals  Enc Vitals Group     BP 08/21/15 1037 163/85 mmHg     Pulse Rate 08/21/15 1035 88     Resp 08/21/15 1035 16     Temp 08/21/15 1037 98.2 F (36.8 C)     Temp Source 08/21/15 1037 Oral     SpO2 08/21/15 1037 98 %     Weight 08/21/15 1035 144 lb (65.318 kg)     Height 08/21/15 1035 5' 6.5" (1.689 m)     Head Cir --      Peak Flow --      Pain Score 08/21/15 1035 8     Pain Loc --      Pain Edu? --      Excl. in [place]? --     Constitutional: Alert and oriented. Patient is in no acute distress but sits and stares most of time here. Eyes: Conjunctivae are normal. PERRL. EOMI. Head: Atraumatic. Nose: No congestion/rhinnorhea. Mouth/Throat: Mucous membranes are moist.  Oropharynx non-erythematous. Neck: No stridor.   Cardiovascular: Normal rate, regular rhythm. Grossly normal heart sounds.  Good peripheral circulation. Respiratory: Normal respiratory effort.  No retractions. Lungs CTAB. Gastrointestinal: Soft and nontender. No distention. No abdominal bruits. No CVA tenderness. Musculoskeletal:  No lower extremity tenderness nor edema.  No joint effusions. Neurologic:  Normal speech and language. No gross focal neurologic deficits are appreciated. Note patient is unable to fully straighten his left arm is chronic. Skin:  Skin is warm, dry and intact. No rash noted For venous stasis changes in the legs.   ____________________________________________   LABS (all labs ordered are listed, but only abnormal results are displayed)  Labs Reviewed  GLUCOSE, CAPILLARY - Abnormal; Notable for the following:    Glucose-Capillary 483 (*)    All other components within normal limits  COMPREHENSIVE METABOLIC PANEL - Abnormal; Notable for the following:    Sodium 134 (*)    Chloride 100 (*)    Glucose, Bld 489 (*)    Alkaline Phosphatase 225 (*)    All other components within normal limits  URINALYSIS COMPLETEWITH MICROSCOPIC ([facility] ONLY) - Abnormal; Notable for the following:    Color, Urine STRAW (*)    APPearance CLEAR (*)    Glucose, UA >500 (*)    Ketones, ur TRACE (*)    Specific Gravity, Urine 1.032 (*)    All other components within normal limits  GLUCOSE, CAPILLARY - Abnormal; Notable for the following:    Glucose-Capillary 293 (*)    All other components within normal limits  TROPONIN I  LACTIC ACID, PLASMA  CBC WITH DIFFERENTIAL/PLATELET  LIPASE, BLOOD  TROPONIN I   ____________________________________________  EKG EKG read and interpreted by me normal sinus rhythm at a rate of 90 normal axis acute ST-T wave changes ____________________________________________  RADIOLOGY  CT of the head is read by radiology as no acute pathology Chest x-ray is read as no acute pathology CT of the abdomen shows changes of chronic pancreatitis constipation in pneumonia as per the radiologist reading. ____________________________________________   PROCEDURES    ____________________________________________   INITIAL IMPRESSION / ASSESSMENT AND PLAN / ED COURSE  Pertinent  labs & imaging results that were available during my care of the patient were reviewed by me and considered in my medical decision making (see chart for details).  Discussed patient with Dr. Nadean Corwin and the neurologist on-call. He recommends the patient is not back to baseline that we should admit or observe him until he becomes back to baseline. He feels that the patient already had a seizure. His son all agree that he is not back to baseline at this point although he is better than he was and is more active and alert than he was. Dr. Barkley Bruns recommends adding Keppra. However the patient's wife remembers that he was on Keppra and it "made him crazy". Therefore I will try to get a hold of Dr. Osie Cheeks and see if he wants to change a the additional antiepileptic medicine. Hospitalist has been informed of all of this plan is to put a hot patient in the hospital until such time as he is better. ____________________________________________   FINAL CLINICAL IMPRESSION(S) / ED DIAGNOSES  Final diagnoses:  Altered mental status, unspecified altered mental status type      Nena Polio, MD 08/21/15 1647  I should add that the patient has a normal chest x-ray but the CT also shows an infiltrate in the base of the long This is most this is most consistent with pneumonia. The patient's additional diagnosis is pneumonia.  Nena Polio, MD 08/21/15 [id number]

## 2015-08-21 NOTE — ED Notes (Signed)
Patient transported to CT 

## 2015-08-21 NOTE — ED Notes (Signed)
Wife reports pt was having difficulty getting words out and not wanting to speak earlier. Was just crying.  Pt now speaking and back at baseline per wife. He is oriented X 3 at this time. Appears sad and tearful though. Wife also reports mass on pancreas they feel is cancer but has not been dx.

## 2015-08-21 NOTE — H&P (Signed)
[facility] NAME: Maurice Patterson    MR#:  [id number]  DATE OF BIRTH:  1955/10/28  DATE OF ADMISSION:  08/21/2015  PRIMARY CARE PHYSICIAN: [name], MD   REQUESTING/REFERRING PHYSICIAN: Dr. Conni Slipper  CHIEF COMPLAINT:   Chief Complaint  Patient presents with  . Abdominal Pain    HISTORY OF PRESENT ILLNESS:  Maurice Patterson  is a 59 y.o. male with a known history of intraductal papillary mucinous tumor of pancreas following with GI, diabetes mellitus insulin-dependent, hypertension, TIAs with no neurological deficits, seizures presents from home secondary to worsening abdominal pain followed by staring spells. Most of the history obtained from patient's son at bedside. According to him who was with the patient this morning, patient will complain of significant abdominal pain and was almost tearful this morning. Son called patient's wife who came home and to note is that patient was having staring spells which she does when he has his seizures. She was not sure if these were many strokes or seizures spells. Patient was confused for a few minutes after that and then back to his baseline. At this time patient is completely alert and oriented and does not remember what happened after he had the abdominal pain episode. He has abdominal pain from time to time due to his chronic pancreatitis episodes and is in the process of getting an ERCP and pancreatic stent placement done as an outpatient by Dr. Candace Cruise. Has chronic diarrhea, but denies any nausea or vomiting. Has weight loss but his intake has been good. He sugars have been elevated and greater than 400 range in the hospital. And he is being admitted for further GI and neurological workup.  PAST MEDICAL HISTORY:   Past Medical History  Diagnosis Date  . Seizures ([place])     staring spells  . Memory loss   . Arthritis   . Stroke ([place])   . Hypertension   . Diabetes mellitus  without complication ([place])   . Malignant intraductal papillary mucinous tumor of pancreas ([place])   . Hernia of abdominal cavity   . Degenerative lumbar disc   . Acute MI ([place])   . TIA (transient ischemic attack)     PAST SURGICAL HISTORY:   Past Surgical History  Procedure Laterality Date  . Left arm metal plate    . Stent times 2  2013    Cardiac  . Left shoulder surgery      SOCIAL HISTORY:   Social History  Substance Use Topics  . Smoking status: Former Research scientist (life sciences)  . Smokeless tobacco: Not on file     Comment: used to smoke 2PD for 40 yrs, quit about 4 years ago  . Alcohol Use: No     Comment: occasional    FAMILY HISTORY:   Family History  Problem Relation Age of Onset  . Diabetes Mother   . Hypertension Mother   . Alzheimer's disease Father     DRUG ALLERGIES:   Allergies  Allergen Reactions  . Hydrocodone Anaphylaxis  . Morphine Other (See Comments)  . Ambien [Zolpidem] Other (See Comments)    delirium    . Brilinta [Ticagrelor] Other (See Comments)    Stroke   . Levetiracetam Diarrhea and Other (See Comments)    Unable to walk  . Lorazepam Hives  . Trazodone Other (See Comments)  . Penicillins Rash    REVIEW OF SYSTEMS:   Review of Systems  Constitutional: Positive for weight loss and  malaise/fatigue. Negative for fever and chills.  HENT: Negative for ear discharge, ear pain, hearing loss, nosebleeds and tinnitus.   Eyes: Negative for blurred vision, double vision and photophobia.  Respiratory: Negative for cough, hemoptysis, shortness of breath and wheezing.   Cardiovascular: Negative for chest pain, palpitations, orthopnea and leg swelling.  Gastrointestinal: Positive for abdominal pain and diarrhea. Negative for heartburn, nausea, vomiting, constipation and melena.  Genitourinary: Negative for dysuria, urgency, frequency and hematuria.  Musculoskeletal: Negative for myalgias, back pain and neck pain.  Skin: Negative for rash.  Neurological:  Positive for dizziness and seizures. Negative for tingling, tremors, sensory change, speech change, focal weakness and headaches.  Endo/Heme/Allergies: Does not bruise/bleed easily.  Psychiatric/Behavioral: Negative for depression.    MEDICATIONS AT HOME:   Prior to Admission medications   Medication Sig Start Date End Date Taking? Authorizing Provider  atorvastatin (LIPITOR) 80 MG tablet Take 1 tablet by mouth daily. 03/12/15  Yes Historical Provider, MD  clopidogrel (PLAVIX) 75 MG tablet Take 1 tablet by mouth daily.   Yes Historical Provider, MD  diphenoxylate-atropine (LOMOTIL) 2.5-0.025 MG tablet Take 1 tablet by mouth 4 (four) times daily as needed for diarrhea or loose stools. 05/27/15 05/26/16 Yes Joanne Gavel, MD  furosemide (LASIX) 40 MG tablet Take 1 tablet by mouth daily.   Yes Historical Provider, MD  insulin aspart (NOVOLOG FLEXPEN) 100 UNIT/ML FlexPen Inject 30 Units into the skin 3 (three) times daily.  03/18/15  Yes Historical Provider, MD  insulin glargine (LANTUS) 100 UNIT/ML injection Inject 75 Units into the skin at bedtime.   Yes Historical Provider, MD  lamoTRIgine (LAMICTAL) 100 MG tablet Take 100 mg by mouth 2 (two) times daily.   Yes Historical Provider, MD  losartan (COZAAR) 100 MG tablet Take 1 tablet by mouth daily. 04/28/14  Yes Historical Provider, MD  metoprolol succinate (TOPROL-XL) 25 MG 24 hr tablet Take 1 tablet by mouth daily.   Yes Historical Provider, MD  mirtazapine (REMERON) 30 MG tablet Take 1 tablet by mouth at bedtime. 03/09/15  Yes Historical Provider, MD  nitroGLYCERIN (NITROSTAT) 0.4 MG SL tablet Place 1 tablet under the tongue every 5 (five) minutes as needed for chest pain.    Yes Historical Provider, MD  omeprazole (PRILOSEC) 20 MG capsule Take 1 capsule by mouth daily. 05/20/15  Yes Historical Provider, MD  potassium chloride (K-DUR) 10 MEQ tablet Take 10 mEq by mouth 2 (two) times daily.   Yes Historical Provider, MD  Acetaminophen 500 MG coapsule  Take 500 mg by mouth 2 (two) times daily.    Historical Provider, MD  doxycycline (VIBRA-TABS) 100 MG tablet Take 1 tablet (100 mg total) by mouth 2 (two) times daily. Patient not taking: Reported on 08/21/2015 08/08/15   Norval Gable, MD  fluconazole (DIFLUCAN) 150 MG tablet Take 1 tablet (150 mg total) by mouth daily. Patient not taking: Reported on 08/21/2015 08/08/15   Norval Gable, MD  guaiFENesin-codeine 100-10 MG/5ML syrup 5-10 ml po qhs prn Patient not taking: Reported on 08/21/2015 08/08/15   Norval Gable, MD      VITAL SIGNS:  Blood pressure 146/123, pulse 81, temperature 98.2 F (36.8 C), temperature source Oral, resp. rate 22, height 5' 6.5" (1.689 m), weight 65.318 kg (144 lb), SpO2 100 %.  PHYSICAL EXAMINATION:   Physical Exam  GENERAL:  59 y.o.-year-old dis-shelved appearing patient lying in the bed with no acute distress.  EYES: Pupils equal, round, reactive to light and accommodation. No scleral icterus. Extraocular muscles intact.  HEENT: Head atraumatic, normocephalic. Oropharynx and nasopharynx clear.  NECK:  Supple, no jugular venous distention. No thyroid enlargement, no tenderness.  LUNGS: Normal breath sounds bilaterally, no wheezing, rales,rhonchi or crepitation. No use of accessory muscles of respiration.  CARDIOVASCULAR: S1, S2 normal. No murmurs, rubs, or gallops.  ABDOMEN: Soft, mild tenderness in the epigastric region, nondistended. Bowel sounds present. No organomegaly or mass.  EXTREMITIES: No pedal edema, cyanosis, or clubbing.  NEUROLOGIC: Cranial nerves II through XII are intact. Muscle strength 5/5 in all extremities. Sensation intact. Gait not checked.  PSYCHIATRIC: The patient is alert and oriented x 3.  SKIN: No obvious rash, lesion, or ulcer.   LABORATORY PANEL:   CBC  Recent Labs Lab 08/21/15 1049  WBC 6.8  HGB 14.7  HCT 44.0  PLT 173    ------------------------------------------------------------------------------------------------------------------  Chemistries   Recent Labs Lab 08/21/15 1049  NA 134*  K 3.8  CL 100*  CO2 25  GLUCOSE 489*  BUN 14  CREATININE 0.72  CALCIUM 9.1  AST 23  ALT 44  ALKPHOS 225*  BILITOT 0.8   ------------------------------------------------------------------------------------------------------------------  Cardiac Enzymes  Recent Labs Lab 08/21/15 1643  TROPONINI <0.03   ------------------------------------------------------------------------------------------------------------------  RADIOLOGY:  Ct Head Wo Contrast  08/21/2015  CLINICAL DATA:  Initial encounter for mental status changes with difficulty speaking. EXAM: CT HEAD WITHOUT CONTRAST TECHNIQUE: Contiguous axial images were obtained from the base of the skull through the vertex without intravenous contrast. COMPARISON:  02/19/2015. FINDINGS: There is no evidence for acute hemorrhage, hydrocephalus, mass lesion, or abnormal extra-axial fluid collection. No definite CT evidence for acute infarction. The visualized paranasal sinuses and mastoid air cells are clear. IMPRESSION: No acute intracranial abnormality. Electronically Signed   By: Misty Stanley M.D.   On: 08/21/2015 16:16   Ct Abdomen Pelvis W Contrast  08/21/2015  CLINICAL DATA:  Upper and lower abdominal pain intermittently for 1 year with worsening today. Chronic pancreatitis. EXAM: CT ABDOMEN AND PELVIS WITH CONTRAST TECHNIQUE: Multidetector CT imaging of the abdomen and pelvis was performed using the standard protocol following bolus administration of intravenous contrast. CONTRAST:  119mL OMNIPAQUE IOHEXOL 300 MG/ML  SOLN COMPARISON:  05/27/2015 CT abdomen/pelvis. FINDINGS: Lower chest: New mild patchy consolidation and ground-glass opacity in the medial posterior basilar right lower lobe, suggesting a mild pneumonia or aspiration. Stable faint subpleural  reticulation in the posterior lower lobe bases. Coronary atherosclerosis. Hepatobiliary: Normal liver with no liver mass. Normal gallbladder with no radiopaque cholelithiasis. No biliary ductal dilatation. Common bile duct diameter 5 mm. Pancreas: Re- demonstrated is diffuse pancreatic atrophy with associated scattered coarse calcifications throughout the pancreas and irregular main pancreatic duct dilation up to 7 mm diameter, not appreciably changed, in keeping with chronic pancreatitis. No pancreatic mass or peripancreatic fat stranding or fluid collections. Spleen: Normal size. No mass. Adrenals/Urinary Tract: Normal adrenals. Solitary subcentimeter hypodense renal cortical lesion in the interpolar right kidney, too small to characterize, unchanged. Otherwise normal kidneys, with no hydronephrosis. Normal bladder. Stomach/Bowel: Grossly normal stomach. Normal caliber small bowel with no small bowel wall thickening. Normal appendix. Moderate stool throughout the colon and rectum, suggesting constipation. No large bowel wall thickening or pericolonic fat stranding. Vascular/Lymphatic: Atherosclerotic nonaneurysmal abdominal aorta. Patent portal, splenic, hepatic and renal veins. No pathologically enlarged lymph nodes in the abdomen or pelvis. Reproductive: Stable normal size prostate with nonspecific internal prostatic calcifications. Other: No pneumoperitoneum, ascites or focal fluid collection. Musculoskeletal: No aggressive appearing focal osseous lesions. Moderate degenerative changes in the visualized thoracolumbar spine. IMPRESSION:  1. Stable findings of chronic pancreatitis, with no CT evidence of acute pancreatitis. 2. No evidence of bowel obstruction or acute bowel inflammation. Normal appendix. Moderate colorectal stool volume, suggesting constipation. 3. Mild patchy consolidation and ground-glass opacity in the medial basilar right lower lobe, suggesting a mild pneumonia or aspiration. Recommend  follow-up PA and lateral post treatment chest radiographs in 6-8 weeks. 4. Stable faint subpleural reticulation in the posterior lower lobe bases, which may indicate a chronic interstitial lung disease. Electronically Signed   By: Ilona Sorrel M.D.   On: 08/21/2015 12:35   Dg Chest Portable 1 View  08/21/2015  CLINICAL DATA:  Upper abdominal pain. EXAM: PORTABLE CHEST 1 VIEW COMPARISON:  August 08, 2015. FINDINGS: The heart size and mediastinal contours are within normal limits. Both lungs are clear. No pneumothorax or pleural effusion is noted. Old right rib fractures are noted. IMPRESSION: No acute cardiopulmonary abnormality seen. Electronically Signed   By: Marijo Conception, M.D.   On: 08/21/2015 11:48    EKG:   Orders placed or performed during the hospital encounter of 08/21/15  . EKG 12-Lead  . EKG 12-Lead    IMPRESSION AND PLAN:   Maurice Patterson  is a 59 y.o. male with a known history of intraductal papillary mucinous tumor of pancreas following with GI, diabetes mellitus insulin-dependent, hypertension, TIAs with no neurological deficits, seizures presents from home secondary to worsening abdominal pain followed by staring spells.  #1 Abdominal pain- patient's chief complaint is abdominal pain. He has been having occasional abdominal pain on and off. -CT of the abdomen is negative for anything other than chronic pancreatitis. -He has some in trauma pancreatic papillary mucinous tumor. Will get GI consult as he was in the process of getting an ERCP and pancreatic stent placed. -Watch his LFTs. Follow his lipase. -Gentle hydration. -Patient states he has an anaphylaxis to morphine and codeine. Currently is pain-free, so we'll just or Tylenol when necessary. Verify with wife about his allergies tomorrow.  #2 Seizure -on history of seizure disorder. Continue Lamictal. -Questionable confusion episode for less than a minute with some staring according to wife. Son did not think that  patient had any seizures. And patient is not aware of the same. -We'll get neurology consult. Patient allergic to Keppra, will hold off on loading as is completely alert and oriented at this time.  #3 Uncontrolled DM-check A1c. -Continue home dose of Lantus and also NovoLog 3 times a day. Started on sliding scale insulin.  #4 HTN- continue home medications with metoprolol and losartan  #5 DVT Prophylaxis- on SQ heparin    All the records are reviewed and case discussed with ED provider. Management plans discussed with the patient, family and they are in agreement.  CODE STATUS: Full Code  TOTAL TIME TAKING CARE OF THIS PATIENT: 50 minutes.    Gladstone Lighter M.D on 08/21/2015 at 6:20 PM  Between 7am to 6pm - Pager - [phone]  After 6pm go to [email or web address] - password [email or web address] [facility]  Office  [phone]  CC: Primary care physician; [name], MD

## 2015-08-21 NOTE — Plan of Care (Signed)
Problem: Education: Goal: Knowledge of Maurice Patterson General Education information/materials will improve Outcome: Progressing Pt and family educated on general medical information/admission. Reminded pt high fall risk andn "call,don't fall." Wife reports remeron makes pt confused and refused it. Pt has some limited recall and turns to wife to support/information. Tolerating po's well. Reports pain in low mid back and low mid abdomen unchanged. Reports takes imodium for chronic diarrhea "6 pills a day". VSS. FSBS improving with insulin therapy continued.

## 2015-08-22 DIAGNOSIS — F05 Delirium due to known physiological condition: Secondary | ICD-10-CM

## 2015-08-22 LAB — GLUCOSE, CAPILLARY
GLUCOSE-CAPILLARY: 363 mg/dL — AB (ref 65–99)
GLUCOSE-CAPILLARY: 309 mg/dL — AB (ref 65–99)
Glucose-Capillary: 361 mg/dL — ABNORMAL HIGH (ref 65–99)
Glucose-Capillary: 299 mg/dL — ABNORMAL HIGH (ref 65–99)

## 2015-08-22 LAB — EXPECTORATED SPUTUM ASSESSMENT W GRAM STAIN, RFLX TO RESP C: Special Requests: NORMAL

## 2015-08-22 LAB — CBC
HEMATOCRIT: 45.9 % (ref 40.0–52.0)
Hemoglobin: 15.4 g/dL (ref 13.0–18.0)
MCH: 31.3 pg (ref 26.0–34.0)
MCHC: 33.6 g/dL (ref 32.0–36.0)
MCV: 93 fL (ref 80.0–100.0)
Platelets: 200 10*3/uL (ref 150–440)
RBC: 4.93 MIL/uL (ref 4.40–5.90)
RDW: 13.4 % (ref 11.5–14.5)
WBC: 7.1 10*3/uL (ref 3.8–10.6)

## 2015-08-22 LAB — BASIC METABOLIC PANEL
ANION GAP: 9 (ref 5–15)
BUN: 8 mg/dL (ref 6–20)
CHLORIDE: 101 mmol/L (ref 101–111)
CO2: 28 mmol/L (ref 22–32)
CREATININE: 0.7 mg/dL (ref 0.61–1.24)
Calcium: 9.2 mg/dL (ref 8.9–10.3)
GFR calc non Af Amer: >60 mL/min (ref >60)
Glucose, Bld: 395 mg/dL — ABNORMAL HIGH (ref 65–99)
POTASSIUM: 3.8 mmol/L (ref 3.5–5.1)
SODIUM: 138 mmol/L (ref 135–145)

## 2015-08-22 LAB — EXPECTORATED SPUTUM ASSESSMENT W REFEX TO RESP CULTURE

## 2015-08-22 LAB — HEMOGLOBIN A1C: HEMOGLOBIN A1C: 11.5 % — AB (ref 4.0–6.0)

## 2015-08-22 MED ORDER — INSULIN GLARGINE 100 UNIT/ML ~~LOC~~ SOLN
80.0000 [IU] | Freq: Every day | SUBCUTANEOUS | Status: DC
  Administered 2015-08-22: 23:00:00 80 [IU] via SUBCUTANEOUS
  Filled 2015-08-22 (×2): qty 0.8

## 2015-08-22 MED ORDER — LEVOFLOXACIN IN D5W 500 MG/100ML IV SOLN
500.0000 mg | INTRAVENOUS | Status: DC
  Administered 2015-08-22: 500 mg via INTRAVENOUS
  Filled 2015-08-22 (×2): qty 100

## 2015-08-22 MED ORDER — POLYETHYLENE GLYCOL 3350 17 G PO PACK
17.0000 g | PACK | Freq: Every day | ORAL | Status: DC
  Administered 2015-08-22 – 2015-08-23 (×2): 17 g via ORAL
  Filled 2015-08-22 (×2): qty 1

## 2015-08-22 NOTE — Progress Notes (Signed)
Inpatient Diabetes Program Recommendations  AACE/ADA: New Consensus Statement on Inpatient Glycemic Control (2015)  Target Ranges:  Prepandial:   less than 140 mg/dL      Peak postprandial:   less than 180 mg/dL (1-2 hours)      Critically ill patients:  140 - 180 mg/dL   Review of Glycemic Control  Results for CLEVESTER, MADRIGAL (MRN [id number]) as of 08/22/2015 08:01  Ref. Range 08/21/2015 10:45 08/21/2015 16:02 08/21/2015 18:04 08/21/2015 21:47 08/22/2015 07:41  Glucose-Capillary Latest Ref Range: 65-99 mg/dL 483 (H) 293 (H) 428 (H) 301 (H) 361 (H)   Diabetes history: Type 2 Outpatient Diabetes medications: Lantus 75 units qhs, Novolog 10 units tid with meals Current orders for Inpatient glycemic control: Lantus 75 units qhs, Novolog 10 units tid with meals, Novolog 0-9units tid, Novolog 0-5 units qhs  Inpatient Diabetes Program Recommendations:  Please consider changing diet to heart healthy/carb modified if appropriate.  Consider increasing Lantus to 80 units qhs Consider increasing correction to resistant scale 0-20 units and changing this to q4h to obtain better control.   Consider consulting endocrinology to assist in obtaining better inpatient glycemic control  Gentry Fitz, RN, [place], [place], CDE Diabetes Coordinator Inpatient Diabetes Program  [phone] (Team Pager) [phone] ([place]) 08/22/2015 8:13 AM

## 2015-08-22 NOTE — Consult Note (Signed)
GI Inpatient Consult Note  Reason for Consult: Abdominal pain , chronic pancreatitis   Attending Requesting Consult: Dr. Tressia Miners  History of Present Illness: Maurice Patterson is a 59 y.o. male with a significant past medical history of intraductal papillary mucinous tumor of the pancreas, insulin-dependent diabetes, seizures, hypertension, past TIAs.   He reports to present to emergency department after having worsening abdominal pain.  He reports that it will come and go, during our interview he was out of bed standing without much pain, reports an hour ago his pain was intense.  He has been seen in our office by Dr. Davonna Belling, with his last office visit being June 12, 2015. At that time he had been found to have gallbladder stones, saw Dr. Tamala Julian for potential surgery, Dr. Tamala Julian recommended that he wait until he see a surgeon at [facility] for possible Whipple's.  Surgeon at [facility] refuse surgery due to patient's significant weight loss.  Patient reports a 50 lb weight loss since June of this year. During that office visit he was to continue Lomotil, and they were considering ERCP with pancreatic stenting, Dr. Davonna Belling did note it may or may not work with increase risk of pancreatitis.  Unfortunately, there was no follow-up after that office visit.  He is unable to tolerate Creon, was started on Lomotil 4 times a day with some relief.   Today he reports that he will still have 3-4 episodes of diarrhea a day.  He describes his abdominal pain has his lower abdomen, also with pain in his lower back.  He is on Plavix.   Past Medical History:  Past Medical History  Diagnosis Date  . Seizures ([place])     staring spells  . Memory loss   . Arthritis   . Stroke ([place])   . Hypertension   . Diabetes mellitus without complication ([place])   . Malignant intraductal papillary mucinous tumor of pancreas ([place])   . Hernia of abdominal cavity   . Degenerative lumbar disc   . Acute MI ([place])   . TIA (transient ischemic  attack)     Problem List: Patient Active Problem List   Diagnosis Date Noted  . Abdominal pain 08/21/2015  . Altered mental status 02/19/2015    Past Surgical History: Past Surgical History  Procedure Laterality Date  . Left arm metal plate    . Stent times 2  2013    Cardiac  . Left shoulder surgery      Allergies: Allergies  Allergen Reactions  . Hydrocodone Anaphylaxis  . Morphine Other (See Comments)  . Ambien [Zolpidem] Other (See Comments)    delirium    . Brilinta [Ticagrelor] Other (See Comments)    Stroke   . Levetiracetam Diarrhea and Other (See Comments)    Unable to walk  . Lorazepam Hives  . Trazodone Other (See Comments)  . Penicillins Rash    Home Medications: Prescriptions prior to admission  Medication Sig Dispense Refill Last Dose  . aspirin 81 MG tablet Take 81 mg by mouth daily.   08/21/2015 at Unknown time  . atorvastatin (LIPITOR) 80 MG tablet Take 1 tablet by mouth daily.  0 08/20/2015 at Unknown time  . clopidogrel (PLAVIX) 75 MG tablet Take 1 tablet by mouth daily.   08/21/2015 at Unknown time  . diphenoxylate-atropine (LOMOTIL) 2.5-0.025 MG tablet Take 1 tablet by mouth 4 (four) times daily as needed for diarrhea or loose stools. (Patient taking differently: Take 1 tablet by mouth 4 (four) times daily  as needed for diarrhea or loose stools. Wife reports takes 6 per day) 30 tablet 1 prn at prn  . furosemide (LASIX) 40 MG tablet Take 1 tablet by mouth daily.   08/21/2015 at Unknown time  . insulin aspart (NOVOLOG FLEXPEN) 100 UNIT/ML FlexPen Inject 30 Units into the skin 3 (three) times daily.    08/20/2015 at Unknown time  . insulin glargine (LANTUS) 100 UNIT/ML injection Inject 75 Units into the skin at bedtime.   08/20/2015 at Unknown time  . lamoTRIgine (LAMICTAL) 100 MG tablet Take 100 mg by mouth 2 (two) times daily.   08/21/2015 at Unknown time  . losartan (COZAAR) 100 MG tablet Take 1 tablet by mouth daily.   08/21/2015 at Unknown time  .  metoprolol succinate (TOPROL-XL) 25 MG 24 hr tablet Take 1 tablet by mouth daily.   08/21/2015 at Unknown time  . nitroGLYCERIN (NITROSTAT) 0.4 MG SL tablet Place 1 tablet under the tongue every 5 (five) minutes as needed for chest pain.    prn at prn  . omeprazole (PRILOSEC) 20 MG capsule Take 1 capsule by mouth daily.  11 08/21/2015 at Unknown time  . potassium chloride (K-DUR) 10 MEQ tablet Take 10 mEq by mouth 2 (two) times daily.   08/21/2015 at Unknown time  . Acetaminophen 500 MG coapsule Take 500 mg by mouth 2 (two) times daily.   prn at prn  . doxycycline (VIBRA-TABS) 100 MG tablet Take 1 tablet (100 mg total) by mouth 2 (two) times daily. (Patient not taking: Reported on 08/21/2015) 20 tablet 0 Completed Course at Unknown time  . fluconazole (DIFLUCAN) 150 MG tablet Take 1 tablet (150 mg total) by mouth daily. (Patient not taking: Reported on 08/21/2015) 1 tablet 0 Completed Course at Unknown time  . guaiFENesin-codeine 100-10 MG/5ML syrup 5-10 ml po qhs prn (Patient not taking: Reported on 08/21/2015) 120 mL 0 Completed Course at Unknown time  . mirtazapine (REMERON) 30 MG tablet Take 1 tablet by mouth at bedtime. Reported on 08/21/2015  1 Not Taking at Unknown time   Home medication reconciliation was completed with the patient.   Scheduled Inpatient Medications:   . atorvastatin  80 mg Oral Daily  . clopidogrel  75 mg Oral Daily  . heparin  5,000 Units Subcutaneous 3 times per day  . insulin aspart  0-5 Units Subcutaneous QHS  . insulin aspart  0-9 Units Subcutaneous TID WC  . insulin aspart  10 Units Subcutaneous TID WC  . insulin glargine  75 Units Subcutaneous QHS  . lamoTRIgine  100 mg Oral BID  . levofloxacin (LEVAQUIN) IV  500 mg Intravenous Q24H  . losartan  100 mg Oral Daily  . metoprolol succinate  25 mg Oral Daily  . mirtazapine  30 mg Oral QHS  . pantoprazole  40 mg Oral Daily  . sodium chloride  3 mL Intravenous Q12H    Continuous Inpatient Infusions:     PRN  Inpatient Medications:  acetaminophen **OR** acetaminophen, diphenoxylate-atropine  Family History: family history includes Alzheimer's disease in his father; Diabetes in his mother; Hypertension in his mother.    Social History:   reports that he has quit smoking. He has never used smokeless tobacco. He reports that he does not drink alcohol or use illicit drugs. T  Review of Systems: Constitutional:   Positive for weight loss.  Eyes: No changes in vision. ENT: No oral lesions, sore throat.  GI: see HPI.  Heme/Lymph: No easy bruising.  CV: No chest  pain.  GU: No hematuria.  Integumentary: No rashes.  Neuro: No headaches.  Psych: No depression/anxiety.  Endocrine: No heat/cold intolerance.  Allergic/Immunologic: No urticaria.  Resp: No cough.  Musculoskeletal: No joint swelling.    Physical Examination: BP 161/87 mmHg  Pulse 82  Temp(Src) 97.5 F (36.4 C) (Oral)  Resp 20  Ht 5' 6.5" (1.689 m)  Wt 65.318 kg (144 lb)  BMI 22.90 kg/m2  SpO2 99% Gen: NAD, alert and oriented x 4, daughter is present and contributes to history HEENT: PEERLA, EOMI, Neck: supple, no JVD or thyromegaly Chest: CTA bilaterally, no wheezes, crackles, or other adventitious sounds CV: RRR, no m/g/c/r Abd: generalized tenderness, ND, +BS in all four quadrants; no HSM, guarding, ridigity, or rebound tenderness Ext: no edema, well perfused with 2+ pulses, Skin: no rash or lesions noted Lymph: no LAD  Data: Lab Results  Component Value Date   WBC 7.1 08/22/2015   HGB 15.4 08/22/2015   HCT 45.9 08/22/2015   MCV 93.0 08/22/2015   PLT 200 08/22/2015    Recent Labs Lab 08/21/15 1049 08/22/15 0721  HGB 14.7 15.4   Lab Results  Component Value Date   NA 138 08/22/2015   K 3.8 08/22/2015   CL 101 08/22/2015   CO2 28 08/22/2015   BUN 8 08/22/2015   CREATININE 0.70 08/22/2015   Lab Results  Component Value Date   ALT 44 08/21/2015   AST 23 08/21/2015   ALKPHOS 225* 08/21/2015   BILITOT  0.8 08/21/2015   No results for input(s): APTT, INR, PTT in the last 168 hours.  Imaging: CLINICAL DATA: Upper and lower abdominal pain intermittently for 1 year with worsening today. Chronic pancreatitis.  EXAM: CT ABDOMEN AND PELVIS WITH CONTRAST  TECHNIQUE: Multidetector CT imaging of the abdomen and pelvis was performed using the standard protocol following bolus administration of intravenous contrast.  CONTRAST: 181m OMNIPAQUE IOHEXOL 300 MG/ML SOLN  COMPARISON: 05/27/2015 CT abdomen/pelvis.  FINDINGS: Lower chest: New mild patchy consolidation and ground-glass opacity in the medial posterior basilar right lower lobe, suggesting a mild pneumonia or aspiration. Stable faint subpleural reticulation in the posterior lower lobe bases. Coronary atherosclerosis.  Hepatobiliary: Normal liver with no liver mass. Normal gallbladder with no radiopaque cholelithiasis. No biliary ductal dilatation. Common bile duct diameter 5 mm.  Pancreas: Re- demonstrated is diffuse pancreatic atrophy with associated scattered coarse calcifications throughout the pancreas and irregular main pancreatic duct dilation up to 7 mm diameter, not appreciably changed, in keeping with chronic pancreatitis. No pancreatic mass or peripancreatic fat stranding or fluid collections.  Spleen: Normal size. No mass.  Adrenals/Urinary Tract: Normal adrenals. Solitary subcentimeter hypodense renal cortical lesion in the interpolar right kidney, too small to characterize, unchanged. Otherwise normal kidneys, with no hydronephrosis. Normal bladder.  Stomach/Bowel: Grossly normal stomach. Normal caliber small bowel with no small bowel wall thickening. Normal appendix. Moderate stool throughout the colon and rectum, suggesting constipation. No large bowel wall thickening or pericolonic fat stranding.  Vascular/Lymphatic: Atherosclerotic nonaneurysmal abdominal aorta. Patent portal, splenic,  hepatic and renal veins. No pathologically enlarged lymph nodes in the abdomen or pelvis.  Reproductive: Stable normal size prostate with nonspecific internal prostatic calcifications.  Other: No pneumoperitoneum, ascites or focal fluid collection.  Musculoskeletal: No aggressive appearing focal osseous lesions. Moderate degenerative changes in the visualized thoracolumbar spine.  IMPRESSION: 1. Stable findings of chronic pancreatitis, with no CT evidence of acute pancreatitis. 2. No evidence of bowel obstruction or acute bowel inflammation. Normal appendix. Moderate colorectal stool  volume, suggesting constipation. 3. Mild patchy consolidation and ground-glass opacity in the medial basilar right lower lobe, suggesting a mild pneumonia or aspiration. Recommend follow-up PA and lateral post treatment chest radiographs in 6-8 weeks. 4. Stable faint subpleural reticulation in the posterior lower lobe bases, which may indicate a chronic interstitial lung disease.   Electronically Signed  By: Ilona Sorrel M.D.  On: 08/21/2015 12:35  Assessment/Plan: Mr. Leonhardt is a 59 y.o. male with diagnosed IPMN, chronic diarrhea, weight loss, severe intermittent abdominal pain.    Lipase 11. Alk-phos elevated at 225. Of note it was 228 on February 19, 2015, 165 on May 27, 2015. Glucose  On admission 489, A1c 11.5.  Is being treated for possible pneumonia, clindamycin and Levaquin.  Is being treated with Lomotil, Protonix.  Is on Plavix.  Full diet.  Recommendations: We recommend that he follow up next week with Dr. Candace Cruise as an outpatient to discuss scheduling ERCP.  We agree with continuing to manage pain, Lomotil and Protonix.  We will continue to follow with you.  Thank you for the consult. Please call with questions or concerns.  Salvadore Farber, PA-C  I personally performed these services.

## 2015-08-22 NOTE — Progress Notes (Signed)
During hand-off report, patient was complaining of abdominal pain and back pain. Only pain medication ordered is Tylenol. Dr. Ether Griffins paged and notified. Order for a soap suds enema placed. Madlyn Frankel, RN

## 2015-08-22 NOTE — Care Management (Signed)
Admitted to this facility with the diagnosis of abdominal pain. Lives with wife, Cecille Rubin, [phone]). Appointment to see Dr. Astrid Divine in 2 weeks. No home health. No skilled facility. No home oxygen. Uses no aids for ambulation. Takes care of all basic activities of daily living himself. Does not drive. Wife and children help with errands. Last fall was the first of 2016. Very good appetite. Family will transport when discharged. No follow-up needs identified at this time. Shelbie Ammons RN MSN CCM Care Management [phone]

## 2015-08-22 NOTE — Progress Notes (Signed)
Sputum culture ordered - patient provided with specimen cup. Madlyn Frankel, RN

## 2015-08-22 NOTE — Consult Note (Signed)
CC: confusion   HPI: Maurice Patterson is an 59 y.o. male that is well known to my service with multiple admissions for delirium with a known history of intraductal papillary mucinous tumor of pancreas following with GI, diabetes mellitus insulin-dependent, hypertension.  Pt was admitted with confusion/disorientation.  Pt was on lamictal for suspected seizures.     Past Medical History  Diagnosis Date  . Seizures ([place])     staring spells  . Memory loss   . Arthritis   . Stroke ([place])   . Hypertension   . Diabetes mellitus without complication ([place])   . Malignant intraductal papillary mucinous tumor of pancreas ([place])   . Hernia of abdominal cavity   . Degenerative lumbar disc   . Acute MI ([place])   . TIA (transient ischemic attack)     Past Surgical History  Procedure Laterality Date  . Left arm metal plate    . Stent times 2  2013    Cardiac  . Left shoulder surgery      Family History  Problem Relation Age of Onset  . Diabetes Mother   . Hypertension Mother   . Alzheimer's disease Father     Social History:  reports that he has quit smoking. He has never used smokeless tobacco. He reports that he does not drink alcohol or use illicit drugs.  Allergies  Allergen Reactions  . Hydrocodone Anaphylaxis  . Morphine Other (See Comments)  . Ambien [Zolpidem] Other (See Comments)    delirium    . Brilinta [Ticagrelor] Other (See Comments)    Stroke   . Levetiracetam Diarrhea and Other (See Comments)    Unable to walk  . Lorazepam Hives  . Trazodone Other (See Comments)  . Penicillins Rash    Medications: I have reviewed the patient's current medications.  ROS: History obtained from the patient  General ROS: negative for - chills, fatigue, fever, night sweats, weight gain or weight loss Psychological ROS: negative for - behavioral disorder, hallucinations, memory difficulties, mood swings or suicidal ideation Ophthalmic ROS: negative for - blurry vision, double  vision, eye pain or loss of vision ENT ROS: negative for - epistaxis, nasal discharge, oral lesions, sore throat, tinnitus or vertigo Allergy and Immunology ROS: negative for - hives or itchy/watery eyes Hematological and Lymphatic ROS: negative for - bleeding problems, bruising or swollen lymph nodes Endocrine ROS: negative for - galactorrhea, hair pattern changes, polydipsia/polyuria or temperature intolerance Respiratory ROS: negative for - cough, hemoptysis, shortness of breath or wheezing Cardiovascular ROS: negative for - chest pain, dyspnea on exertion, edema or irregular heartbeat Gastrointestinal ROS: negative for - abdominal pain, diarrhea, hematemesis, nausea/vomiting or stool incontinence Genito-Urinary ROS: negative for - dysuria, hematuria, incontinence or urinary frequency/urgency Musculoskeletal ROS: negative for - joint swelling or muscular weakness Neurological ROS: as noted in HPI Dermatological ROS: negative for rash and skin lesion changes  Physical Examination: Blood pressure 137/75, pulse 98, temperature 97.5 F (36.4 C), temperature source Oral, resp. rate 20, height 5' 6.5" (1.689 m), weight 144 lb (65.318 kg), SpO2 100 %.   Neurological Examination Mental Status: Alert, oriented, thought content appropriate.  Speech fluent without evidence of aphasia.  Able to follow 3 step commands without difficulty. Cranial Nerves: II: Discs flat bilaterally; Visual fields grossly normal, pupils equal, round, reactive to light and accommodation III,IV, VI: ptosis not present, extra-ocular motions intact bilaterally V,VII: smile symmetric, facial light touch sensation normal bilaterally VIII: hearing normal bilaterally IX,X: gag reflex present XI: bilateral shoulder  shrug XII: midline tongue extension Motor: Right : Upper extremity   5/5    Left:     Upper extremity   5/5  Lower extremity   5/5     Lower extremity   5/5 Tone and bulk:normal tone throughout; no atrophy  noted Sensory: Pinprick and light touch intact throughout, bilaterally Deep Tendon Reflexes: 2+ and symmetric throughout Plantars: Right: downgoing   Left: downgoing Cerebellar: normal finger-to-nose Gait: normal gait and station      Laboratory Studies:   Basic Metabolic Panel:  Recent Labs Lab 08/21/15 1049 08/22/15 0721  NA 134* 138  K 3.8 3.8  CL 100* 101  CO2 25 28  GLUCOSE 489* 395*  BUN 14 8  CREATININE 0.72 0.70  CALCIUM 9.1 9.2    Liver Function Tests:  Recent Labs Lab 08/21/15 1049  AST 23  ALT 44  ALKPHOS 225*  BILITOT 0.8  PROT 7.1  ALBUMIN 4.3    Recent Labs Lab 08/21/15 1049  LIPASE 11   No results for input(s): AMMONIA in the last 168 hours.  CBC:  Recent Labs Lab 08/21/15 1049 08/22/15 0721  WBC 6.8 7.1  NEUTROABS 4.1  --   HGB 14.7 15.4  HCT 44.0 45.9  MCV 94.9 93.0  PLT 173 200    Cardiac Enzymes:  Recent Labs Lab 08/21/15 1049 08/21/15 1643  TROPONINI <0.03 <0.03    BNP: Invalid input(s): POCBNP  CBG:  Recent Labs Lab 08/21/15 1602 08/21/15 1804 08/21/15 2147 08/22/15 0741 08/22/15 1122  GLUCAP 293* 428* 301* 361* 363*    Microbiology: Results for orders placed or performed during the hospital encounter of 05/27/15  Stool culture     Status: None   Collection Time: 05/27/15  5:42 PM  Result Value Ref Range Status   Specimen Description STOOL  Final   Special Requests Normal  Final   Culture   Final    NO SALMONELLA OR SHIGELLA ISOLATED No Pathogenic E. coli detected NO CAMPYLOBACTER DETECTED    Report Status 05/30/2015 FINAL  Final  C difficile quick scan w PCR reflex     Status: None   Collection Time: 05/27/15  5:43 PM  Result Value Ref Range Status   C Diff antigen NEGATIVE NEGATIVE Final   C Diff toxin NEGATIVE NEGATIVE Final   C Diff interpretation Negative for C. difficile  Final    Coagulation Studies: No results for input(s): LABPROT, INR in the last 72 hours.  Urinalysis:   Recent Labs Lab 08/21/15 1104  COLORURINE STRAW*  LABSPEC 1.032*  PHURINE 6.0  GLUCOSEU >500*  HGBUR NEGATIVE  BILIRUBINUR NEGATIVE  KETONESUR TRACE*  PROTEINUR NEGATIVE  NITRITE NEGATIVE  LEUKOCYTESUR NEGATIVE    Lipid Panel:     Component Value Date/Time   CHOL 73 09/19/2014 0407   TRIG 98 09/19/2014 0407   HDL 23* 09/19/2014 0407   VLDL 20 09/19/2014 0407   LDLCALC 30 09/19/2014 0407    HgbA1C:  Lab Results  Component Value Date   HGBA1C 11.5* 08/21/2015    Urine Drug Screen:  No results found for: LABOPIA, COCAINSCRNUR, LABBENZ, AMPHETMU, THCU, LABBARB  Alcohol Level: No results for input(s): ETH in the last 168 hours.  Other results: EKG: normal EKG, normal sinus rhythm, unchanged from previous tracings.  Imaging: Ct Head Wo Contrast  08/21/2015  CLINICAL DATA:  Initial encounter for mental status changes with difficulty speaking. EXAM: CT HEAD WITHOUT CONTRAST TECHNIQUE: Contiguous axial images were obtained from the base of the  skull through the vertex without intravenous contrast. COMPARISON:  02/19/2015. FINDINGS: There is no evidence for acute hemorrhage, hydrocephalus, mass lesion, or abnormal extra-axial fluid collection. No definite CT evidence for acute infarction. The visualized paranasal sinuses and mastoid air cells are clear. IMPRESSION: No acute intracranial abnormality. Electronically Signed   By: Misty Stanley M.D.   On: 08/21/2015 16:16   Ct Abdomen Pelvis W Contrast  08/21/2015  CLINICAL DATA:  Upper and lower abdominal pain intermittently for 1 year with worsening today. Chronic pancreatitis. EXAM: CT ABDOMEN AND PELVIS WITH CONTRAST TECHNIQUE: Multidetector CT imaging of the abdomen and pelvis was performed using the standard protocol following bolus administration of intravenous contrast. CONTRAST:  158mL OMNIPAQUE IOHEXOL 300 MG/ML  SOLN COMPARISON:  05/27/2015 CT abdomen/pelvis. FINDINGS: Lower chest: New mild patchy consolidation and  ground-glass opacity in the medial posterior basilar right lower lobe, suggesting a mild pneumonia or aspiration. Stable faint subpleural reticulation in the posterior lower lobe bases. Coronary atherosclerosis. Hepatobiliary: Normal liver with no liver mass. Normal gallbladder with no radiopaque cholelithiasis. No biliary ductal dilatation. Common bile duct diameter 5 mm. Pancreas: Re- demonstrated is diffuse pancreatic atrophy with associated scattered coarse calcifications throughout the pancreas and irregular main pancreatic duct dilation up to 7 mm diameter, not appreciably changed, in keeping with chronic pancreatitis. No pancreatic mass or peripancreatic fat stranding or fluid collections. Spleen: Normal size. No mass. Adrenals/Urinary Tract: Normal adrenals. Solitary subcentimeter hypodense renal cortical lesion in the interpolar right kidney, too small to characterize, unchanged. Otherwise normal kidneys, with no hydronephrosis. Normal bladder. Stomach/Bowel: Grossly normal stomach. Normal caliber small bowel with no small bowel wall thickening. Normal appendix. Moderate stool throughout the colon and rectum, suggesting constipation. No large bowel wall thickening or pericolonic fat stranding. Vascular/Lymphatic: Atherosclerotic nonaneurysmal abdominal aorta. Patent portal, splenic, hepatic and renal veins. No pathologically enlarged lymph nodes in the abdomen or pelvis. Reproductive: Stable normal size prostate with nonspecific internal prostatic calcifications. Other: No pneumoperitoneum, ascites or focal fluid collection. Musculoskeletal: No aggressive appearing focal osseous lesions. Moderate degenerative changes in the visualized thoracolumbar spine. IMPRESSION: 1. Stable findings of chronic pancreatitis, with no CT evidence of acute pancreatitis. 2. No evidence of bowel obstruction or acute bowel inflammation. Normal appendix. Moderate colorectal stool volume, suggesting constipation. 3. Mild patchy  consolidation and ground-glass opacity in the medial basilar right lower lobe, suggesting a mild pneumonia or aspiration. Recommend follow-up PA and lateral post treatment chest radiographs in 6-8 weeks. 4. Stable faint subpleural reticulation in the posterior lower lobe bases, which may indicate a chronic interstitial lung disease. Electronically Signed   By: Ilona Sorrel M.D.   On: 08/21/2015 12:35   Dg Chest Portable 1 View  08/21/2015  CLINICAL DATA:  Upper abdominal pain. EXAM: PORTABLE CHEST 1 VIEW COMPARISON:  August 08, 2015. FINDINGS: The heart size and mediastinal contours are within normal limits. Both lungs are clear. No pneumothorax or pleural effusion is noted. Old right rib fractures are noted. IMPRESSION: No acute cardiopulmonary abnormality seen. Electronically Signed   By: Marijo Conception, M.D.   On: 08/21/2015 11:48     Assessment/Plan:   59 y.o. male that is well known to my service with multiple admissions for delirium with a known history of intraductal papillary mucinous tumor of pancreas following with GI, diabetes mellitus insulin-dependent, hypertension.  Pt was admitted with confusion/disorientation.  Pt was on lamictal for suspected seizures.    Currently back to baseline.  CTH no acute abnormalities.   - Started  Keppra 500 BID in addition to lamictal - no further imaging for neuro stand point - d/w daughter at bedside Leotis Pain  08/22/2015, 2:04 PM

## 2015-08-22 NOTE — Progress Notes (Signed)
Correction:  Had MRCP at [facility] 06/2015 showing chronic and panc and likely IPMN so will not repeat MRCP.  May benefit from Pancreatic-Biliary clinic consult at tertiary center to decide on EUS vs ERCP.

## 2015-08-22 NOTE — Progress Notes (Signed)
[place] [facility] [place] NAME: Maurice Patterson    MR#:  [id number]  DATE OF BIRTH:  September 09, 1955  SUBJECTIVE:  CHIEF COMPLAINT:   Chief Complaint  Patient presents with  . Abdominal Pain   patient is 59 year old Caucasian male with past history significant for history of intraductal papillary mucinous tumor of pancreas for which patient is being prepped for ERCP who presents to the hospital with periumbilical abdominal pain radiating to the back. Patient's CT scan of abdomen revealed no acute pancreatitis or enlargement of pancreatic duct, but stool retention. An enema was tried with no success. MiraLAX as administered. Discussed this gastroenterologist for possible ERCP today, however, he did not feel that this is needed and MRCP was recommended if it was not recently done. Upon further evaluation, it appeared that patient had MRI of pancreatic area in October 2016 at [facility], which revealed cystic lesion with focally dilated duct or pancreatic head.   Review of Systems  Gastrointestinal: Positive for abdominal pain.    VITAL SIGNS: Blood pressure 137/75, pulse 98, temperature 97.5 F (36.4 C), temperature source Oral, resp. rate 20, height 5' 6.5" (1.689 m), weight 65.318 kg (144 lb), SpO2 100 %.  PHYSICAL EXAMINATION:   GENERAL:  59 y.o.-year-old patient lying in the bed with no acute distress.  EYES: Pupils equal, round, reactive to light and accommodation. No scleral icterus. Extraocular muscles intact.  HEENT: Head atraumatic, normocephalic. Oropharynx and nasopharynx clear.  NECK:  Supple, no jugular venous distention. No thyroid enlargement, no tenderness.  LUNGS: Normal breath sounds bilaterally, no wheezing, rales,rhonchi or crepitation. No use of accessory muscles of respiration.  CARDIOVASCULAR: S1, S2 normal. No murmurs, rubs, or gallops.  ABDOMEN: Soft, mild discomfort on palpation in periumbilical area but no rebound or guarding was  noted, nondistended. Bowel sounds present. No organomegaly or mass.  EXTREMITIES: No pedal edema, cyanosis, or clubbing.  NEUROLOGIC: Cranial nerves II through XII are intact. Muscle strength 5/5 in all extremities. Sensation intact. Gait not checked.  PSYCHIATRIC: The patient is alert and oriented x 3.  SKIN: No obvious rash, lesion, or ulcer.   ORDERS/RESULTS REVIEWED:   CBC  Recent Labs Lab 08/21/15 1049 08/22/15 0721  WBC 6.8 7.1  HGB 14.7 15.4  HCT 44.0 45.9  PLT 173 200  MCV 94.9 93.0  MCH 31.7 31.3  MCHC 33.4 33.6  RDW 13.6 13.4  LYMPHSABS 1.9  --   MONOABS 0.6  --   EOSABS 0.1  --   BASOSABS 0.0  --    ------------------------------------------------------------------------------------------------------------------  Chemistries   Recent Labs Lab 08/21/15 1049 08/22/15 0721  NA 134* 138  K 3.8 3.8  CL 100* 101  CO2 25 28  GLUCOSE 489* 395*  BUN 14 8  CREATININE 0.72 0.70  CALCIUM 9.1 9.2  AST 23  --   ALT 44  --   ALKPHOS 225*  --   BILITOT 0.8  --    ------------------------------------------------------------------------------------------------------------------ estimated creatinine clearance is 92.5 mL/min (by C-G formula based on Cr of 0.7). ------------------------------------------------------------------------------------------------------------------ No results for input(s): TSH, T4TOTAL, T3FREE, THYROIDAB in the last 72 hours.  Invalid input(s): FREET3  Cardiac Enzymes  Recent Labs Lab 08/21/15 1049 08/21/15 1643  TROPONINI <0.03 <0.03   ------------------------------------------------------------------------------------------------------------------ Invalid input(s): POCBNP ---------------------------------------------------------------------------------------------------------------  RADIOLOGY: Ct Head Wo Contrast  08/21/2015  CLINICAL DATA:  Initial encounter for mental status changes with difficulty speaking. EXAM: CT HEAD  WITHOUT CONTRAST TECHNIQUE: Contiguous axial images were obtained from the  base of the skull through the vertex without intravenous contrast. COMPARISON:  02/19/2015. FINDINGS: There is no evidence for acute hemorrhage, hydrocephalus, mass lesion, or abnormal extra-axial fluid collection. No definite CT evidence for acute infarction. The visualized paranasal sinuses and mastoid air cells are clear. IMPRESSION: No acute intracranial abnormality. Electronically Signed   By: Misty Stanley M.D.   On: 08/21/2015 16:16   Ct Abdomen Pelvis W Contrast  08/21/2015  CLINICAL DATA:  Upper and lower abdominal pain intermittently for 1 year with worsening today. Chronic pancreatitis. EXAM: CT ABDOMEN AND PELVIS WITH CONTRAST TECHNIQUE: Multidetector CT imaging of the abdomen and pelvis was performed using the standard protocol following bolus administration of intravenous contrast. CONTRAST:  145mL OMNIPAQUE IOHEXOL 300 MG/ML  SOLN COMPARISON:  05/27/2015 CT abdomen/pelvis. FINDINGS: Lower chest: New mild patchy consolidation and ground-glass opacity in the medial posterior basilar right lower lobe, suggesting a mild pneumonia or aspiration. Stable faint subpleural reticulation in the posterior lower lobe bases. Coronary atherosclerosis. Hepatobiliary: Normal liver with no liver mass. Normal gallbladder with no radiopaque cholelithiasis. No biliary ductal dilatation. Common bile duct diameter 5 mm. Pancreas: Re- demonstrated is diffuse pancreatic atrophy with associated scattered coarse calcifications throughout the pancreas and irregular main pancreatic duct dilation up to 7 mm diameter, not appreciably changed, in keeping with chronic pancreatitis. No pancreatic mass or peripancreatic fat stranding or fluid collections. Spleen: Normal size. No mass. Adrenals/Urinary Tract: Normal adrenals. Solitary subcentimeter hypodense renal cortical lesion in the interpolar right kidney, too small to characterize, unchanged. Otherwise  normal kidneys, with no hydronephrosis. Normal bladder. Stomach/Bowel: Grossly normal stomach. Normal caliber small bowel with no small bowel wall thickening. Normal appendix. Moderate stool throughout the colon and rectum, suggesting constipation. No large bowel wall thickening or pericolonic fat stranding. Vascular/Lymphatic: Atherosclerotic nonaneurysmal abdominal aorta. Patent portal, splenic, hepatic and renal veins. No pathologically enlarged lymph nodes in the abdomen or pelvis. Reproductive: Stable normal size prostate with nonspecific internal prostatic calcifications. Other: No pneumoperitoneum, ascites or focal fluid collection. Musculoskeletal: No aggressive appearing focal osseous lesions. Moderate degenerative changes in the visualized thoracolumbar spine. IMPRESSION: 1. Stable findings of chronic pancreatitis, with no CT evidence of acute pancreatitis. 2. No evidence of bowel obstruction or acute bowel inflammation. Normal appendix. Moderate colorectal stool volume, suggesting constipation. 3. Mild patchy consolidation and ground-glass opacity in the medial basilar right lower lobe, suggesting a mild pneumonia or aspiration. Recommend follow-up PA and lateral post treatment chest radiographs in 6-8 weeks. 4. Stable faint subpleural reticulation in the posterior lower lobe bases, which may indicate a chronic interstitial lung disease. Electronically Signed   By: Ilona Sorrel M.D.   On: 08/21/2015 12:35   Dg Chest Portable 1 View  08/21/2015  CLINICAL DATA:  Upper abdominal pain. EXAM: PORTABLE CHEST 1 VIEW COMPARISON:  August 08, 2015. FINDINGS: The heart size and mediastinal contours are within normal limits. Both lungs are clear. No pneumothorax or pleural effusion is noted. Old right rib fractures are noted. IMPRESSION: No acute cardiopulmonary abnormality seen. Electronically Signed   By: Marijo Conception, M.D.   On: 08/21/2015 11:48    EKG:  Orders placed or performed during the hospital  encounter of 08/21/15  . EKG 12-Lead  . EKG 12-Lead    ASSESSMENT AND PLAN:  Active Problems:   Abdominal pain 1. Periumbilical abdominal pain of unclear etiology, could be related to intraductal pancreatic mucinous neoplasm, ERCP was recommended by Dr. Candace Cruise, patient is to be seen by the gastroenterologist today, discussed  with Dr. Rayann Heman. CT of abdomen also revealed moderate stool throughout the colon and rectum, suggesting constipation, and an enema was given with . mixed results . Patient will also be given MiraLAX, following stool output and clinically  2. Diabetes mellitus type 2, poorly controlled with hyperglycemia and long-term current use of insulin, poorly controlled, get hemoglobin A1c, advanced insulin Lantus 3. Seizure disorder, patient was seen by neurologist and recommended to add Keppra to Lamictal 4. Essential hypertension, add lisinopril if needed 5. Community acquired versus Aspiration pneumonitis, continue levofloxacin , follow clinically   Management plans discussed with the patient, family and they are in agreement.   DRUG ALLERGIES:  Allergies  Allergen Reactions  . Hydrocodone Anaphylaxis  . Morphine Other (See Comments)  . Ambien [Zolpidem] Other (See Comments)    delirium    . Brilinta [Ticagrelor] Other (See Comments)    Stroke   . Levetiracetam Diarrhea and Other (See Comments)    Unable to walk  . Lorazepam Hives  . Trazodone Other (See Comments)  . Penicillins Rash    CODE STATUS:     Code Status Orders        Start     Ordered   08/21/15 1913  Full code   Continuous     08/21/15 1912      TOTAL TIME TAKING CARE OF THIS PATIENT: 50 minutes.  Discussed with gastroenterologist and neurologist for approximately 10-15 minutes  Lauralye Kinn M.D on 08/22/2015 at 3:02 PM  Between 7am to 6pm - Pager - [phone]  After 6pm go to [email or web address] - password [email or web address] [facility]  Office  [phone]  CC: Primary care  physician; [name], MD

## 2015-08-22 NOTE — Progress Notes (Signed)
Initial Nutrition Assessment   INTERVENTION:   Meals and Snacks: Cater to patient preferences, will send snacks between meals such as yogurt, chicken salad and cheese cubes with grapes; all snacks meeting 1-2 carb servings between meals. Agree with addition of cab modified diet order to aid in blood sugars. Education: RD discussed importance of eating calorically dense foods as snacks as opposed to popcorn and chips for the nutrition as pt with chronic diarrhea.   NUTRITION DIAGNOSIS:   Unintentional weight loss related to chronic illness as evidenced by percent weight loss.  GOAL:   Patient will meet greater than or equal to 90% of their needs  MONITOR:    (Energy Intake, Electrolyte and Renal Profile, Anthropometrics, Digestive system, Glucose Profile)  REASON FOR ASSESSMENT:   Malnutrition Screening Tool    ASSESSMENT:   Pt admitted with abdominal pain with h/o pancreatic cancer and chronic pancreatitis. Per MD note pt in the process of getting pancreatic stents placed outpatient PTA.  Past Medical History  Diagnosis Date  . Seizures ([place])     staring spells  . Memory loss   . Arthritis   . Stroke ([place])   . Hypertension   . Diabetes mellitus without complication ([place])   . Malignant intraductal papillary mucinous tumor of pancreas ([place])   . Hernia of abdominal cavity   . Degenerative lumbar disc   . Acute MI ([place])   . TIA (transient ischemic attack)     Diet Order:  Diet heart healthy/carb modified Room service appropriate?: Yes; Fluid consistency:: Thin    Current Nutrition: Pt reports eating very well today, 100% of eggs and toast.  Food/Nutrition-Related History: Pt reports very good appetite PTA eating 3 meals per day with snacks between meals. Pt's wife reports pt eats mostly chips and popcorn between meals. No supplements PTA as pt reports they aggravate his diarrhea.    Scheduled Medications:  . atorvastatin  80 mg Oral Daily  . clopidogrel  75 mg Oral  Daily  . heparin  5,000 Units Subcutaneous 3 times per day  . insulin aspart  0-5 Units Subcutaneous QHS  . insulin aspart  0-9 Units Subcutaneous TID WC  . insulin aspart  10 Units Subcutaneous TID WC  . insulin glargine  80 Units Subcutaneous QHS  . lamoTRIgine  100 mg Oral BID  . levofloxacin (LEVAQUIN) IV  500 mg Intravenous Q24H  . losartan  100 mg Oral Daily  . metoprolol succinate  25 mg Oral Daily  . mirtazapine  30 mg Oral QHS  . pantoprazole  40 mg Oral Daily  . polyethylene glycol  17 g Oral Daily  . sodium chloride  3 mL Intravenous Q12H      Electrolyte/Renal Profile and Glucose Profile:   Recent Labs Lab 08/21/15 1049 08/22/15 0721  NA 134* 138  K 3.8 3.8  CL 100* 101  CO2 25 28  BUN 14 8  CREATININE 0.72 0.70  CALCIUM 9.1 9.2  GLUCOSE 489* 395*   Protein Profile:  Recent Labs Lab 08/21/15 1049  ALBUMIN 4.3    Gastrointestinal Profile: Last BM:  08/22/2015   Nutrition-Focused Physical Exam Findings:  Unable to complete Nutrition-Focused physical exam at this time.    Weight Change: Pt reports weight of 215lbs this past June (33% weight loss in 7 months)    Height:   Ht Readings from Last 1 Encounters:  08/21/15 5' 6.5" (1.689 m)    Weight:   Wt Readings from Last 1 Encounters:  08/21/15  144 lb (65.318 kg)    Wt Readings from Last 10 Encounters:  08/21/15 144 lb (65.318 kg)  08/08/15 169 lb (76.658 kg)  05/27/15 185 lb (83.915 kg)  02/19/15 170 lb (77.111 kg)  02/20/15 170 lb (77.111 kg)     BMI:  Body mass index is 22.9 kg/(m^2).  Estimated Nutritional Needs:   Kcal:  BEE: 1408kcals, TEE: (IF 1.1-1.3)(AF 1.3) 1783-2107kcals  Protein:  72-84g protein (1.1-1.3g/kg)  Fluid:  1625-1943mL of fluid (25-32mL/kg)  EDUCATION NEEDS:   No education needs identified at this time   [place], RD, LDN Pager [phone] Weekend/On-Call Pager [phone]

## 2015-08-23 DIAGNOSIS — J189 Pneumonia, unspecified organism: Secondary | ICD-10-CM

## 2015-08-23 DIAGNOSIS — Z794 Long term (current) use of insulin: Secondary | ICD-10-CM

## 2015-08-23 DIAGNOSIS — K5900 Constipation, unspecified: Secondary | ICD-10-CM

## 2015-08-23 DIAGNOSIS — I10 Essential (primary) hypertension: Secondary | ICD-10-CM

## 2015-08-23 DIAGNOSIS — E1165 Type 2 diabetes mellitus with hyperglycemia: Secondary | ICD-10-CM

## 2015-08-23 DIAGNOSIS — J181 Lobar pneumonia, unspecified organism: Secondary | ICD-10-CM

## 2015-08-23 LAB — CBC
HCT: 42.7 % (ref 40.0–52.0)
HEMOGLOBIN: 14.5 g/dL (ref 13.0–18.0)
MCH: 31.5 pg (ref 26.0–34.0)
MCHC: 34 g/dL (ref 32.0–36.0)
MCV: 92.8 fL (ref 80.0–100.0)
Platelets: 174 10*3/uL (ref 150–440)
RBC: 4.6 MIL/uL (ref 4.40–5.90)
RDW: 13.7 % (ref 11.5–14.5)
WBC: 7.2 10*3/uL (ref 3.8–10.6)

## 2015-08-23 LAB — GLUCOSE, CAPILLARY
GLUCOSE-CAPILLARY: 149 mg/dL — AB (ref 65–99)
GLUCOSE-CAPILLARY: 101 mg/dL — AB (ref 65–99)

## 2015-08-23 LAB — HEMOGLOBIN A1C: Hgb A1c MFr Bld: 11.4 % — ABNORMAL HIGH (ref 4.0–6.0)

## 2015-08-23 MED ORDER — LEVOFLOXACIN 500 MG PO TABS: 500.0000 mg | ORAL_TABLET | Freq: Every day | ORAL | Status: DC

## 2015-08-23 MED ORDER — POLYETHYLENE GLYCOL 3350 17 G PO PACK: 17.0000 g | PACK | Freq: Every day | ORAL | Status: DC

## 2015-08-23 MED ORDER — INSULIN ASPART 100 UNIT/ML ~~LOC~~ SOLN: 10.0000 [IU] | Freq: Three times a day (TID) | SUBCUTANEOUS | Status: DC

## 2015-08-23 MED ORDER — INSULIN GLARGINE 100 UNIT/ML ~~LOC~~ SOLN: 80.0000 [IU] | Freq: Every day | SUBCUTANEOUS | Status: DC

## 2015-08-23 MED ORDER — LEVOFLOXACIN 500 MG PO TABS
500.0000 mg | ORAL_TABLET | Freq: Every day | ORAL | Status: DC
  Administered 2015-08-23: 500 mg via ORAL
  Filled 2015-08-23: qty 1

## 2015-08-23 NOTE — Progress Notes (Signed)
Patient had no c/o pain this shift VSS Soap suds enema given per MD order for constipation with good results Received MD order to discharge patient to home, reviewed discharge instructions, follow up appointments  home meds, and prescriptions with patients wife and patient and both verbalized understanding, discharged in wheelchair with nursing and family members to home

## 2015-08-23 NOTE — Discharge Summary (Signed)
[facility] NAME: Maurice Patterson    MR#:  [id number]  DATE OF BIRTH:  [birth year]  DATE OF ADMISSION:  08/21/2015 ADMITTING PHYSICIAN: Gladstone Lighter, MD  DATE OF DISCHARGE: 08/23/2015  PRIMARY CARE PHYSICIAN: [name], MD     ADMISSION DIAGNOSIS:  Altered mental status, unspecified altered mental status type [R41.82]  DISCHARGE DIAGNOSIS:  Principal Problem:   Abdominal pain Active Problems:   Constipation   Uncontrolled type 2 diabetes mellitus with hyperglycemia, with long-term current use of insulin (HCC)   Right lower lobe pneumonia   Essential hypertension   SECONDARY DIAGNOSIS:   Past Medical History  Diagnosis Date  . Seizures ([place])     staring spells  . Memory loss   . Arthritis   . Stroke ([place])   . Hypertension   . Diabetes mellitus without complication ([place])   . Malignant intraductal papillary mucinous tumor of pancreas ([place])   . Hernia of abdominal cavity   . Degenerative lumbar disc   . Acute MI ([place])   . TIA (transient ischemic attack)     .pro HOSPITAL COURSE:   The patient is 59 year old Caucasian male with past medical history significant for history of intraductal papillary mucinous tumor of pancreas who presents to the hospital with complaints of primary medical abdominal pain radiating to the back. On arrival to emergency room, patient had CT scan of the abdomen which showed no acute pancreatitis, but stool retention. Patient was consulted by Dr. Rayann Heman, gastroenterologist, who recommended patient to follow-up with his primary gastroenterologist, Dr. Candace Cruise for possibly tertiary care center referral. Patient was initiated on enemas and MiraLAX and had few bowel movements after which she is abdominal pain somewhat subsided and he was stable to be discharged home Discussion by problem 1. Periumbilical abdominal pain of unclear etiology, could be related to intraductal pancreatic  mucinous neoplasm, the patient is to follow-up with primary gastroenterologist, Dr. Candace Cruise for ERCP versus endoscopic ultrasound at tertiary care center, appreciate Dr. Jackalyn Lombard input. CT of abdomen also revealed moderate stool throughout the colon and rectum, suggesting constipation, she was given an enema and MiraLAX and produced stool with some alleviation of his abdominal discomfort  2. Diabetes mellitus type 2, hemoglobin A1c is 11.4 , poorly controlled with hyperglycemia and long-term current use of insulin, poorly controlled, continue advanced insulin Lantus dose, would benefit from endocrinologist, follow-up as outpatient. Patient would also benefit from lifestyle evaluation as outpatient as well 3. Seizure disorder, patient was seen by neurologist and recommended to add Keppra to Lamictal 4. Essential hypertension, continue outpatient medications 5. Community acquired versus Aspiration pneumonitis, continue levofloxacin for 6 days , follow up with primary care physician within one week after discharge  DISCHARGE CONDITIONS:   Stable  CONSULTS OBTAINED:  Treatment Team:  Leotis Pain, MD Hulen Luster, MD Josefine Class, MD  DRUG ALLERGIES:   Allergies  Allergen Reactions  . Hydrocodone Anaphylaxis  . Morphine Other (See Comments)  . Ambien [Zolpidem] Other (See Comments)    delirium    . Brilinta [Ticagrelor] Other (See Comments)    Stroke   . Levetiracetam Diarrhea and Other (See Comments)    Unable to walk  . Lorazepam Hives  . Trazodone Other (See Comments)  . Penicillins Rash    DISCHARGE MEDICATIONS:   Current Discharge Medication List    START taking these medications   Details  levofloxacin (LEVAQUIN) 500 MG tablet Take 1 tablet (500 mg total) by  mouth daily. Qty: 60 tablet, Refills: 6    polyethylene glycol (MIRALAX / GLYCOLAX) packet Take 17 g by mouth daily. Qty: 14 each, Refills: 0      CONTINUE these medications which have CHANGED   Details   insulin glargine (LANTUS) 100 UNIT/ML injection Inject 0.8 mLs (80 Units total) into the skin at bedtime. Qty: 10 mL, Refills: 11      CONTINUE these medications which have NOT CHANGED   Details  aspirin 81 MG tablet Take 81 mg by mouth daily.    atorvastatin (LIPITOR) 80 MG tablet Take 1 tablet by mouth daily. Refills: 0    clopidogrel (PLAVIX) 75 MG tablet Take 1 tablet by mouth daily.    diphenoxylate-atropine (LOMOTIL) 2.5-0.025 MG tablet Take 1 tablet by mouth 4 (four) times daily as needed for diarrhea or loose stools. Qty: 30 tablet, Refills: 1    furosemide (LASIX) 40 MG tablet Take 1 tablet by mouth daily.    insulin aspart (NOVOLOG FLEXPEN) 100 UNIT/ML FlexPen Inject 30 Units into the skin 3 (three) times daily.     lamoTRIgine (LAMICTAL) 100 MG tablet Take 100 mg by mouth 2 (two) times daily.    losartan (COZAAR) 100 MG tablet Take 1 tablet by mouth daily.    metoprolol succinate (TOPROL-XL) 25 MG 24 hr tablet Take 1 tablet by mouth daily.    nitroGLYCERIN (NITROSTAT) 0.4 MG SL tablet Place 1 tablet under the tongue every 5 (five) minutes as needed for chest pain.     omeprazole (PRILOSEC) 20 MG capsule Take 1 capsule by mouth daily. Refills: 11    potassium chloride (K-DUR) 10 MEQ tablet Take 10 mEq by mouth 2 (two) times daily.    Acetaminophen 500 MG coapsule Take 500 mg by mouth 2 (two) times daily.    guaiFENesin-codeine 100-10 MG/5ML syrup 5-10 ml po qhs prn Qty: 120 mL, Refills: 0    mirtazapine (REMERON) 30 MG tablet Take 1 tablet by mouth at bedtime. Reported on 08/21/2015 Refills: 1      STOP taking these medications     doxycycline (VIBRA-TABS) 100 MG tablet      fluconazole (DIFLUCAN) 150 MG tablet          DISCHARGE INSTRUCTIONS:    Patient is to follow-up with primary care physician and gastroenterologist as outpatient  If you experience worsening of your admission symptoms, develop shortness of breath, life threatening emergency,  suicidal or homicidal thoughts you must seek medical attention immediately by calling 911 or calling your MD immediately  if symptoms less severe.  You Must read complete instructions/literature along with all the possible adverse reactions/side effects for all the Medicines you take and that have been prescribed to you. Take any new Medicines after you have completely understood and accept all the possible adverse reactions/side effects.   Please note  You were cared for by a hospitalist during your hospital stay. If you have any questions about your discharge medications or the care you received while you were in the hospital after you are discharged, you can call the unit and asked to speak with the hospitalist on call if the hospitalist that took care of you is not available. Once you are discharged, your primary care physician will handle any further medical issues. Please note that NO REFILLS for any discharge medications will be authorized once you are discharged, as it is imperative that you return to your primary care physician (or establish a relationship with a primary care physician if  you do not have one) for your aftercare needs so that they can reassess your need for medications and monitor your lab values.    Today   CHIEF COMPLAINT:   Chief Complaint  Patient presents with  . Abdominal Pain    HISTORY OF PRESENT ILLNESS:  Maurice Patterson  is a 59 y.o. male with a known history of intraductal papillary mucinous tumor of pancreas who presents to the hospital with complaints of primary medical abdominal pain radiating to the back. On arrival to emergency room, patient had CT scan of the abdomen which showed no acute pancreatitis, but stool retention. Patient was consulted by Dr. Rayann Heman, gastroenterologist, who recommended patient to follow-up with his primary gastroenterologist, Dr. Candace Cruise for possibly tertiary care center referral. Patient was initiated on enemas and MiraLAX and had few  bowel movements after which she is abdominal pain somewhat subsided and he was stable to be discharged home Discussion by problem 1. Periumbilical abdominal pain of unclear etiology, could be related to intraductal pancreatic mucinous neoplasm, the patient is to follow-up with primary gastroenterologist, Dr. Candace Cruise for ERCP versus endoscopic ultrasound at tertiary care center, appreciate Dr. Jackalyn Lombard input. CT of abdomen also revealed moderate stool throughout the colon and rectum, suggesting constipation, she was given an enema and MiraLAX and produced stool with some alleviation of his abdominal discomfort  2. Diabetes mellitus type 2, hemoglobin A1c is 11.4 , poorly controlled with hyperglycemia and long-term current use of insulin, poorly controlled, continue advanced insulin Lantus dose, would benefit from endocrinologist, follow-up as outpatient. Patient would also benefit from lifestyle evaluation as outpatient as well 3. Seizure disorder, patient was seen by neurologist and recommended to add Keppra to Lamictal 4. Essential hypertension, continue outpatient medications 5. Community acquired versus Aspiration pneumonitis, continue levofloxacin for 6 days , follow up with primary care physician within one week after discharge    VITAL SIGNS:  Blood pressure 153/76, pulse 78, temperature 97.5 F (36.4 C), temperature source Oral, resp. rate 18, height 5' 6.5" (1.689 m), weight 65.318 kg (144 lb), SpO2 98 %.  I/O:   Intake/Output Summary (Last 24 hours) at 08/23/15 1344 Last data filed at 08/23/15 0900  Gross per 24 hour  Intake    480 ml  Output      0 ml  Net    480 ml    PHYSICAL EXAMINATION:  GENERAL:  59 y.o.-year-old patient lying in the bed with no acute distress.  EYES: Pupils equal, round, reactive to light and accommodation. No scleral icterus. Extraocular muscles intact.  HEENT: Head atraumatic, normocephalic. Oropharynx and nasopharynx clear.  NECK:  Supple, no jugular venous  distention. No thyroid enlargement, no tenderness.  LUNGS: Normal breath sounds bilaterally, no wheezing, rales,rhonchi or crepitation. No use of accessory muscles of respiration.  CARDIOVASCULAR: S1, S2 normal. No murmurs, rubs, or gallops.  ABDOMEN: Soft, non-tender, non-distended. Bowel sounds present. No organomegaly or mass.  EXTREMITIES: No pedal edema, cyanosis, or clubbing.  NEUROLOGIC: Cranial nerves II through XII are intact. Muscle strength 5/5 in all extremities. Sensation intact. Gait not checked.  PSYCHIATRIC: The patient is alert and oriented x 3.  SKIN: No obvious rash, lesion, or ulcer.   DATA REVIEW:   CBC  Recent Labs Lab 08/23/15 0413  WBC 7.2  HGB 14.5  HCT 42.7  PLT 174    Chemistries   Recent Labs Lab 08/21/15 1049 08/22/15 0721  NA 134* 138  K 3.8 3.8  CL 100* 101  CO2 25 28  GLUCOSE 489* 395*  BUN 14 8  CREATININE 0.72 0.70  CALCIUM 9.1 9.2  AST 23  --   ALT 44  --   ALKPHOS 225*  --   BILITOT 0.8  --     Cardiac Enzymes  Recent Labs Lab 08/21/15 1643  [place] <0.03    Microbiology Results  Results for orders placed or performed during the hospital encounter of 08/21/15  Culture, expectorated sputum-assessment     Status: None   Collection Time: 08/22/15  5:13 PM  Result Value Ref Range Status   Specimen Description EXPECTORATED SPUTUM  Final   Special Requests Normal  Final   Sputum evaluation THIS SPECIMEN IS ACCEPTABLE FOR SPUTUM CULTURE  Final   Report Status 08/22/2015 FINAL  Final  Culture, respiratory (NON-Expectorated)     Status: None (Preliminary result)   Collection Time: 08/22/15  5:13 PM  Result Value Ref Range Status   Specimen Description EXPECTORATED SPUTUM  Final   Special Requests Normal Reflexed from [id number]  Final   Gram Stain PENDING  Incomplete   Culture TOO YOUNG TO READ  Final   Report Status PENDING  Incomplete    RADIOLOGY:  Ct Head Wo Contrast  08/21/2015  CLINICAL DATA:  Initial encounter for  mental status changes with difficulty speaking. EXAM: CT HEAD WITHOUT CONTRAST TECHNIQUE: Contiguous axial images were obtained from the base of the skull through the vertex without intravenous contrast. COMPARISON:  02/19/2015. FINDINGS: There is no evidence for acute hemorrhage, hydrocephalus, mass lesion, or abnormal extra-axial fluid collection. No definite CT evidence for acute infarction. The visualized paranasal sinuses and mastoid air cells are clear. IMPRESSION: No acute intracranial abnormality. Electronically Signed   By: Misty Stanley M.D.   On: 08/21/2015 16:16    EKG:   Orders placed or performed during the hospital encounter of 08/21/15  . EKG 12-Lead  . EKG 12-Lead      Management plans discussed with the patient, family and they are in agreement.  CODE STATUS:     Code Status Orders        Start     Ordered   08/21/15 1913  Full code   Continuous     08/21/15 1912      TOTAL TIME TAKING CARE OF THIS PATIENT: 40 minutes.    Theodoro Grist M.D on 08/23/2015 at 1:44 PM  Between 7am to 6pm - Pager - [phone]  After 6pm go to [email or web address] - password [email or web address] [facility]  Office  [phone]  CC: Primary care physician; [name], MD

## 2015-08-25 LAB — CULTURE, RESPIRATORY W GRAM STAIN: Special Requests: NORMAL

## 2015-08-25 LAB — CULTURE, RESPIRATORY: CULTURE: NORMAL

## 2015-08-26 LAB — CULTURE, BLOOD (ROUTINE X 2)
CULTURE: NO GROWTH
Culture: NO GROWTH

## 2015-08-29 DIAGNOSIS — K5909 Other constipation: Secondary | ICD-10-CM | POA: Diagnosis not present

## 2015-08-29 DIAGNOSIS — K861 Other chronic pancreatitis: Secondary | ICD-10-CM | POA: Diagnosis not present

## 2015-09-05 DIAGNOSIS — R197 Diarrhea, unspecified: Secondary | ICD-10-CM | POA: Diagnosis not present

## 2015-09-05 DIAGNOSIS — E1100 Type 2 diabetes mellitus with hyperosmolarity without nonketotic hyperglycemic-hyperosmolar coma (NKHHC): Secondary | ICD-10-CM | POA: Diagnosis not present

## 2015-09-12 ENCOUNTER — Ambulatory Visit
Admission: RE | Admit: 2015-09-12 | Discharge: 2015-09-12 | Disposition: A | Discharge status: Home Health | Payer: PPO | Source: Ambulatory Visit | Attending: Gastroenterology | Admitting: Gastroenterology

## 2015-09-12 ENCOUNTER — Ambulatory Visit: Payer: PPO | Admitting: Anesthesiology

## 2015-09-12 ENCOUNTER — Encounter
Admission: RE | Disposition: A | Discharge status: Home Health | Payer: Self-pay | Source: Ambulatory Visit | Attending: Gastroenterology

## 2015-09-12 ENCOUNTER — Encounter: Payer: Self-pay | Admitting: Anesthesiology

## 2015-09-12 ENCOUNTER — Ambulatory Visit: Payer: PPO

## 2015-09-12 DIAGNOSIS — J449 Chronic obstructive pulmonary disease, unspecified: Secondary | ICD-10-CM | POA: Diagnosis not present

## 2015-09-12 DIAGNOSIS — K861 Other chronic pancreatitis: Secondary | ICD-10-CM | POA: Diagnosis not present

## 2015-09-12 DIAGNOSIS — I2510 Atherosclerotic heart disease of native coronary artery without angina pectoris: Secondary | ICD-10-CM | POA: Diagnosis not present

## 2015-09-12 DIAGNOSIS — Z794 Long term (current) use of insulin: Secondary | ICD-10-CM | POA: Insufficient documentation

## 2015-09-12 DIAGNOSIS — I10 Essential (primary) hypertension: Secondary | ICD-10-CM | POA: Diagnosis not present

## 2015-09-12 DIAGNOSIS — Z7982 Long term (current) use of aspirin: Secondary | ICD-10-CM | POA: Insufficient documentation

## 2015-09-12 DIAGNOSIS — Z87891 Personal history of nicotine dependence: Secondary | ICD-10-CM | POA: Insufficient documentation

## 2015-09-12 DIAGNOSIS — Z8673 Personal history of transient ischemic attack (TIA), and cerebral infarction without residual deficits: Secondary | ICD-10-CM | POA: Insufficient documentation

## 2015-09-12 DIAGNOSIS — I252 Old myocardial infarction: Secondary | ICD-10-CM | POA: Insufficient documentation

## 2015-09-12 DIAGNOSIS — R109 Unspecified abdominal pain: Secondary | ICD-10-CM | POA: Diagnosis not present

## 2015-09-12 DIAGNOSIS — Z8507 Personal history of malignant neoplasm of pancreas: Secondary | ICD-10-CM | POA: Insufficient documentation

## 2015-09-12 DIAGNOSIS — Z79899 Other long term (current) drug therapy: Secondary | ICD-10-CM | POA: Diagnosis not present

## 2015-09-12 DIAGNOSIS — E119 Type 2 diabetes mellitus without complications: Secondary | ICD-10-CM | POA: Insufficient documentation

## 2015-09-12 DIAGNOSIS — M1990 Unspecified osteoarthritis, unspecified site: Secondary | ICD-10-CM | POA: Diagnosis not present

## 2015-09-12 DIAGNOSIS — K8689 Other specified diseases of pancreas: Secondary | ICD-10-CM | POA: Diagnosis not present

## 2015-09-12 HISTORY — PX: ERCP: SHX5425

## 2015-09-12 LAB — GLUCOSE, CAPILLARY: GLUCOSE-CAPILLARY: 272 mg/dL — AB (ref 65–99)

## 2015-09-12 SURGERY — ERCP, WITH INTERVENTION IF INDICATED
Anesthesia: General

## 2015-09-12 MED ORDER — INSULIN REGULAR HUMAN 100 UNIT/ML IJ SOLN: 5.0000 [IU] | Freq: Once | INTRAMUSCULAR | Status: DC

## 2015-09-12 MED ORDER — SODIUM CHLORIDE 0.9 % IV SOLN: INTRAVENOUS | Status: DC

## 2015-09-12 MED ORDER — LIDOCAINE HCL (CARDIAC) 20 MG/ML IV SOLN
INTRAVENOUS | Status: DC | PRN
  Administered 2015-09-12: 100 mg via INTRAVENOUS

## 2015-09-12 MED ORDER — PHENYLEPHRINE HCL 10 MG/ML IJ SOLN
INTRAMUSCULAR | Status: DC | PRN
  Administered 2015-09-12 (×2): 100 ug via INTRAVENOUS

## 2015-09-12 MED ORDER — IPRATROPIUM-ALBUTEROL 0.5-2.5 (3) MG/3ML IN SOLN
3.0000 mL | Freq: Once | RESPIRATORY_TRACT | Status: AC
  Administered 2015-09-12: 3 mL via RESPIRATORY_TRACT

## 2015-09-12 MED ORDER — LEVOFLOXACIN IN D5W 250 MG/50ML IV SOLN
250.0000 mg | Freq: Once | INTRAVENOUS | Status: AC
  Administered 2015-09-12: 250 mg via INTRAVENOUS
  Filled 2015-09-12: qty 50

## 2015-09-12 MED ORDER — INDOMETHACIN 50 MG RE SUPP
100.0000 mg | Freq: Once | RECTAL | Status: AC
  Administered 2015-09-12: 100 mg via RECTAL
  Filled 2015-09-12: qty 2

## 2015-09-12 MED ORDER — PROPOFOL 10 MG/ML IV BOLUS
INTRAVENOUS | Status: DC | PRN
  Administered 2015-09-12: 70 mg via INTRAVENOUS
  Administered 2015-09-12: 30 mg via INTRAVENOUS

## 2015-09-12 MED ORDER — GLYCOPYRROLATE 0.2 MG/ML IJ SOLN
INTRAMUSCULAR | Status: DC | PRN
  Administered 2015-09-12: .2 mg via INTRAVENOUS

## 2015-09-12 MED ORDER — PROPOFOL 500 MG/50ML IV EMUL
INTRAVENOUS | Status: DC | PRN
  Administered 2015-09-12: 130 ug/kg/min via INTRAVENOUS

## 2015-09-12 MED ORDER — IPRATROPIUM-ALBUTEROL 0.5-2.5 (3) MG/3ML IN SOLN
RESPIRATORY_TRACT | Status: AC
  Administered 2015-09-12: 3 mL via RESPIRATORY_TRACT
  Filled 2015-09-12: qty 3

## 2015-09-12 MED ORDER — INSULIN ASPART 100 UNIT/ML ~~LOC~~ SOLN
5.0000 [IU] | Freq: Once | SUBCUTANEOUS | Status: DC
  Filled 2015-09-12 (×2): qty 0.05

## 2015-09-12 MED ORDER — SODIUM CHLORIDE 0.9 % IV SOLN
INTRAVENOUS | Status: DC
  Administered 2015-09-12: 12:00:00 via INTRAVENOUS

## 2015-09-12 NOTE — Anesthesia Preprocedure Evaluation (Addendum)
Anesthesia Evaluation  Patient identified by MRN, date of birth, ID band Patient awake    Reviewed: Allergy & Precautions, H&P , NPO status , Patient's Chart, lab work & pertinent test results  History of Anesthesia Complications (+) Emergence Delirium and history of anesthetic complications  Airway Mallampati: III  TM Distance: >3 FB Neck ROM: limited    Dental  (+) Poor Dentition, Chipped, Missing, Upper Dentures   Pulmonary neg shortness of breath, pneumonia, resolved, COPD, former smoker,    Pulmonary exam normal breath sounds clear to auscultation       Cardiovascular Exercise Tolerance: Good hypertension, (-) angina+ CAD, + Past MI and + DOE  Normal cardiovascular exam Rhythm:regular Rate:Normal     Neuro/Psych Seizures -,  TIACVA, Residual Symptoms negative psych ROS   GI/Hepatic negative GI ROS, Neg liver ROS,   Endo/Other  diabetes, Poorly Controlled, Type 2, Insulin Dependent  Renal/GU negative Renal ROS  negative genitourinary   Musculoskeletal  (+) Arthritis ,   Abdominal   Peds  Hematology negative hematology ROS (+)   Anesthesia Other Findings Past Medical History:   Seizures (HCC)                                                 Comment:staring spells   Memory loss                                                  Arthritis                                                    Stroke ([place])                                                 Hypertension                                                 Diabetes mellitus without complication (HCC)                 Malignant intraductal papillary mucinous tumor*              Hernia of abdominal cavity                                   Degenerative lumbar disc                                     Acute MI ([place])  TIA (transient ischemic attack)                             Past Surgical History:   left arm metal  plate                                          Stent times 2                                    2013           Comment:Cardiac   Left shoulder surgery                                           Reproductive/Obstetrics negative OB ROS                            Anesthesia Physical Anesthesia Plan  ASA: III  Anesthesia Plan: General   Post-op Pain Management:    Induction:   Airway Management Planned:   Additional Equipment:   Intra-op Plan:   Post-operative Plan:   Informed Consent: I have reviewed the patients History and Physical, chart, labs and discussed the procedure including the risks, benefits and alternatives for the proposed anesthesia with the patient or authorized representative who has indicated his/her understanding and acceptance.   Dental Advisory Given  Plan Discussed with: Anesthesiologist, CRNA and Surgeon  Anesthesia Plan Comments:         Anesthesia Quick Evaluation

## 2015-09-12 NOTE — H&P (Signed)
Primary Care Physician:  [name], MD Primary Gastroenterologist:  Dr. Candace Cruise  Pre-Procedure History & Physical: HPI:  Maurice Patterson is a 60 y.o. male is here for an ERCP.  Past Medical History  Diagnosis Date  . Seizures ([place])     staring spells  . Memory loss   . Arthritis   . Stroke ([place])   . Hypertension   . Diabetes mellitus without complication ([place])   . Malignant intraductal papillary mucinous tumor of pancreas ([place])   . Hernia of abdominal cavity   . Degenerative lumbar disc   . Acute MI ([place])   . TIA (transient ischemic attack)     Past Surgical History  Procedure Laterality Date  . Left arm metal plate    . Stent times 2  2013    Cardiac  . Left shoulder surgery      Prior to Admission medications   Medication Sig Start Date End Date Taking? Authorizing Provider  Acetaminophen 500 MG coapsule Take 500 mg by mouth 2 (two) times daily.    Historical Provider, MD  aspirin 81 MG tablet Take 81 mg by mouth daily.    Historical Provider, MD  atorvastatin (LIPITOR) 80 MG tablet Take 1 tablet by mouth daily. 03/12/15   Historical Provider, MD  clopidogrel (PLAVIX) 75 MG tablet Take 1 tablet by mouth daily.    Historical Provider, MD  diphenoxylate-atropine (LOMOTIL) 2.5-0.025 MG tablet Take 1 tablet by mouth 4 (four) times daily as needed for diarrhea or loose stools. Patient taking differently: Take 1 tablet by mouth 4 (four) times daily as needed for diarrhea or loose stools. Wife reports takes 6 per day 05/27/15 05/26/16  Joanne Gavel, MD  furosemide (LASIX) 40 MG tablet Take 1 tablet by mouth daily.    Historical Provider, MD  guaiFENesin-codeine 100-10 MG/5ML syrup 5-10 ml po qhs prn Patient not taking: Reported on 08/21/2015 08/08/15   Norval Gable, MD  insulin aspart (NOVOLOG FLEXPEN) 100 UNIT/ML FlexPen Inject 30 Units into the skin 3 (three) times daily.  03/18/15   Historical Provider, MD  insulin glargine (LANTUS) 100 UNIT/ML injection Inject 0.8 mLs  (80 Units total) into the skin at bedtime. 08/23/15   Theodoro Grist, MD  lamoTRIgine (LAMICTAL) 100 MG tablet Take 100 mg by mouth 2 (two) times daily.    Historical Provider, MD  levofloxacin (LEVAQUIN) 500 MG tablet Take 1 tablet (500 mg total) by mouth daily. 08/23/15   Theodoro Grist, MD  losartan (COZAAR) 100 MG tablet Take 1 tablet by mouth daily. 04/28/14   Historical Provider, MD  metoprolol succinate (TOPROL-XL) 25 MG 24 hr tablet Take 1 tablet by mouth daily.    Historical Provider, MD  mirtazapine (REMERON) 30 MG tablet Take 1 tablet by mouth at bedtime. Reported on 08/21/2015 03/09/15   Historical Provider, MD  nitroGLYCERIN (NITROSTAT) 0.4 MG SL tablet Place 1 tablet under the tongue every 5 (five) minutes as needed for chest pain.     Historical Provider, MD  omeprazole (PRILOSEC) 20 MG capsule Take 1 capsule by mouth daily. 05/20/15   Historical Provider, MD  polyethylene glycol (MIRALAX / GLYCOLAX) packet Take 17 g by mouth daily. 08/23/15   Theodoro Grist, MD  potassium chloride (K-DUR) 10 MEQ tablet Take 10 mEq by mouth 2 (two) times daily.    Historical Provider, MD    Allergies as of 09/09/2015 - Review Complete 08/21/2015  Allergen Reaction Noted  . Hydrocodone Anaphylaxis 02/19/2015  . Morphine Other (See  Comments) 05/27/2015  . Ambien [zolpidem] Other (See Comments) 02/19/2015  . Brilinta [ticagrelor] Other (See Comments) 02/19/2015  . Levetiracetam Diarrhea and Other (See Comments) 05/27/2015  . Lorazepam Hives 05/27/2015  . Trazodone Other (See Comments) 05/27/2015  . Penicillins Rash 02/19/2015    Family History  Problem Relation Age of Onset  . Diabetes Mother   . Hypertension Mother   . Alzheimer's disease Father     Social History   Social History  . Marital Status: Married    Spouse Name: N/A  . Number of Children: N/A  . Years of Education: N/A   Occupational History  . Not on file.   Social History Main Topics  . Smoking status: Former Research scientist (life sciences)  .  Smokeless tobacco: Never Used     Comment: used to smoke 2PD for 40 yrs, quit about 4 years ago  . Alcohol Use: No     Comment: occasional  . Drug Use: No  . Sexual Activity: Not on file   Other Topics Concern  . Not on file   Social History Narrative   Lives at home with wife and son. Ambulatory at baseline.    Review of Systems: See HPI, otherwise negative ROS  Physical Exam: There were no vitals taken for this visit. General:   Alert,  pleasant and cooperative in NAD Head:  Normocephalic and atraumatic. Neck:  Supple; no masses or thyromegaly. Lungs:  Clear throughout to auscultation.    Heart:  Regular rate and rhythm. Abdomen:  Soft, nontender and nondistended. Normal bowel sounds, without guarding, and without rebound.   Neurologic:  Alert and  oriented x4;  grossly normal neurologically.  Impression/Plan: Maurice Patterson is here for an ERCP to be performed for chronic pancreatitis  Risks, benefits, limitations, and alternatives regarding  ERCP have been reviewed with the patient.  Questions have been answered.  All parties agreeable.   [name], Lupita Dawn, MD  09/12/2015, 10:46 AM

## 2015-09-12 NOTE — Anesthesia Postprocedure Evaluation (Signed)
Anesthesia Post Note  Patient: Maurice Patterson  Procedure(s) Performed: Procedure(s) (LRB): ENDOSCOPIC RETROGRADE CHOLANGIOPANCREATOGRAPHY (ERCP) (N/A)  Patient location during evaluation: Endoscopy Anesthesia Type: General Level of consciousness: awake and alert Pain management: pain level controlled Vital Signs Assessment: post-procedure vital signs reviewed and stable Respiratory status: spontaneous breathing, nonlabored ventilation, respiratory function stable and patient connected to nasal cannula oxygen Cardiovascular status: blood pressure returned to baseline and stable Postop Assessment: no signs of nausea or vomiting Anesthetic complications: no    Last Vitals:  Filed Vitals:   09/12/15 1307 09/12/15 1317  BP: 125/87 132/86  Pulse: 99 96  Temp:    Resp: 15 19    Last Pain: There were no vitals filed for this visit.               Precious Haws Cindie Rajagopalan

## 2015-09-12 NOTE — Transfer of Care (Signed)
Immediate Anesthesia Transfer of Care Note  Patient: Maurice Patterson  Procedure(s) Performed: Procedure(s): ENDOSCOPIC RETROGRADE CHOLANGIOPANCREATOGRAPHY (ERCP) (N/A)  Patient Location: Endoscopy Unit  Anesthesia Type:General  Level of Consciousness: sedated  Airway & Oxygen Therapy: Patient Spontanous Breathing and Patient connected to nasal cannula oxygen  Post-op Assessment: Report given to RN and Post -op Vital signs reviewed and stable  Post vital signs: Reviewed and stable  Last Vitals:  Filed Vitals:   09/12/15 1114  BP: 139/78  Pulse: 90  Temp: 37.1 C  Resp: 17    Complications: No apparent anesthesia complications

## 2015-09-12 NOTE — Op Note (Signed)
[facility] Gastroenterology Patient Name: Maurice Patterson Procedure Date: 09/12/2015 12:04 PM MRN: [id number] Account #: [id number] Date of Birth: [birth year] Admit Type: Outpatient Age: 60 Room: [employer] ENDO ROOM 4 Gender: Male Note Status: Finalized Procedure:         ERCP Indications:       Abdominal pain of suspected pancreatic origin, Abnormal                     abdominal CT, IPMN at pancreatic head. Chronic pancreatitis Providers:         Lupita Dawn. Candace Cruise, MD Referring MD:      [name], MD (Referring MD) Medicines:         Monitored Anesthesia Care Complications:     No immediate complications. Procedure:         Pre-Anesthesia Assessment:                    - Prior to the procedure, a History and Physical was                     performed, and patient medications, allergies and                     sensitivities were reviewed. The patient's tolerance of                     previous anesthesia was reviewed.                    - The risks and benefits of the procedure and the sedation                     options and risks were discussed with the patient. All                     questions were answered and informed consent was obtained.                    - After reviewing the risks and benefits, the patient was                     deemed in satisfactory condition to undergo the procedure.                    After obtaining informed consent, the scope was passed                     under direct vision. Throughout the procedure, the                     patient's blood pressure, pulse, and oxygen saturations                     were monitored continuously. The ERCP was introduced                     through the mouth, and used to inject contrast into and                     used to inject contrast into the ventral pancreatic duct.                     The ERCP was accomplished without difficulty. The patient  tolerated the procedure  well. Findings:      The scout film was normal. The esophagus was successfully intubated       under direct vision. The scope was advanced to a normal major papilla in       the descending duodenum without detailed examination of the pharynx,       larynx and associated structures, and upper GI tract. The upper GI tract       was grossly normal. The ventral pancreatic duct was deeply cannulated       with the short-nosed traction sphincterotome. Contrast was injected. I       personally interpreted the pancreatic duct images. Ductal flow of       contrast was adequate. Image quality was adequate. Contrast extended to       the pancreatic duct. Diffuse changes, including dilation of the main       distal pancreatic duct, irregularity of the main pancreatic duct and       ductal stenosis, were seen in the entire opacified area, consistent with       moderate chronic pancreatitis. PD irregular at head with long stricture       near the head. Ampulla wide open with clear as well as thick secretions       draining from IPMN. One 5 Fr by 7 cm temporary stent with two internal       flaps was placed into the dorsal pancreatic duct. Clear fluid flowed       through the stent(s). The stent was in good position and bypassed the       stricture. Impression:        - One temporary stent was placed into the dorsal                     pancreatic duct.                    - Moderate chronic pancreatitis. Recommendation:    - Discharge patient to home.                    - Observe patient's clinical course.                    - Return to my office in 4 weeks.                    - The findings and recommendations were discussed with the                     patient. Procedure Code(s): --- Professional ---                    [phone], Endoscopic retrograde cholangiopancreatography                     (ERCP); with placement of endoscopic stent into biliary or                     pancreatic duct, including pre-  and post-dilation and                     guide wire passage, when performed, including                     sphincterotomy, when performed, each stent Diagnosis Code(s): --- Professional ---  K86.1, Other chronic pancreatitis                    R10.9, Unspecified abdominal pain                    R93.5, Abnormal findings on diagnostic imaging of other                     abdominal regions, including retroperitoneum CPT copyright 2014 American Medical Association. All rights reserved. The codes documented in this report are preliminary and upon coder review may  be revised to meet current compliance requirements. Hulen Luster, MD 09/12/2015 12:49:53 PM This report has been signed electronically. Number of Addenda: 0 Note Initiated On: 09/12/2015 12:04 PM      [facility]

## 2015-09-17 ENCOUNTER — Encounter: Payer: Self-pay | Admitting: Gastroenterology

## 2015-09-18 DIAGNOSIS — R413 Other amnesia: Secondary | ICD-10-CM | POA: Diagnosis not present

## 2015-09-18 DIAGNOSIS — R404 Transient alteration of awareness: Secondary | ICD-10-CM | POA: Diagnosis not present

## 2015-09-18 DIAGNOSIS — Z8673 Personal history of transient ischemic attack (TIA), and cerebral infarction without residual deficits: Secondary | ICD-10-CM | POA: Diagnosis not present

## 2015-09-18 DIAGNOSIS — K8689 Other specified diseases of pancreas: Secondary | ICD-10-CM | POA: Diagnosis not present

## 2015-09-18 DIAGNOSIS — G479 Sleep disorder, unspecified: Secondary | ICD-10-CM | POA: Diagnosis not present

## 2015-10-10 DIAGNOSIS — K869 Disease of pancreas, unspecified: Secondary | ICD-10-CM | POA: Diagnosis not present

## 2015-10-10 DIAGNOSIS — K529 Noninfective gastroenteritis and colitis, unspecified: Secondary | ICD-10-CM | POA: Diagnosis not present

## 2015-10-10 DIAGNOSIS — K861 Other chronic pancreatitis: Secondary | ICD-10-CM | POA: Diagnosis not present

## 2015-10-17 DIAGNOSIS — E1165 Type 2 diabetes mellitus with hyperglycemia: Secondary | ICD-10-CM | POA: Diagnosis not present

## 2015-10-17 DIAGNOSIS — I10 Essential (primary) hypertension: Secondary | ICD-10-CM | POA: Diagnosis not present

## 2015-10-17 DIAGNOSIS — Z794 Long term (current) use of insulin: Secondary | ICD-10-CM | POA: Diagnosis not present

## 2015-10-18 ENCOUNTER — Emergency Department: Payer: PPO

## 2015-10-18 ENCOUNTER — Observation Stay
Admission: EM | Admit: 2015-10-18 | Discharge: 2015-10-20 | Discharge status: Against Medical Advice | Payer: PPO | Attending: Specialist | Admitting: Specialist

## 2015-10-18 DIAGNOSIS — Z885 Allergy status to narcotic agent status: Secondary | ICD-10-CM | POA: Diagnosis not present

## 2015-10-18 DIAGNOSIS — I252 Old myocardial infarction: Secondary | ICD-10-CM | POA: Insufficient documentation

## 2015-10-18 DIAGNOSIS — Z888 Allergy status to other drugs, medicaments and biological substances status: Secondary | ICD-10-CM | POA: Insufficient documentation

## 2015-10-18 DIAGNOSIS — I10 Essential (primary) hypertension: Secondary | ICD-10-CM | POA: Insufficient documentation

## 2015-10-18 DIAGNOSIS — R410 Disorientation, unspecified: Secondary | ICD-10-CM | POA: Diagnosis not present

## 2015-10-18 DIAGNOSIS — Z7902 Long term (current) use of antithrombotics/antiplatelets: Secondary | ICD-10-CM | POA: Diagnosis not present

## 2015-10-18 DIAGNOSIS — K862 Cyst of pancreas: Secondary | ICD-10-CM | POA: Insufficient documentation

## 2015-10-18 DIAGNOSIS — E785 Hyperlipidemia, unspecified: Secondary | ICD-10-CM | POA: Diagnosis not present

## 2015-10-18 DIAGNOSIS — R413 Other amnesia: Secondary | ICD-10-CM | POA: Diagnosis not present

## 2015-10-18 DIAGNOSIS — R079 Chest pain, unspecified: Secondary | ICD-10-CM | POA: Insufficient documentation

## 2015-10-18 DIAGNOSIS — J432 Centrilobular emphysema: Secondary | ICD-10-CM | POA: Insufficient documentation

## 2015-10-18 DIAGNOSIS — Z79899 Other long term (current) drug therapy: Secondary | ICD-10-CM | POA: Diagnosis not present

## 2015-10-18 DIAGNOSIS — R569 Unspecified convulsions: Secondary | ICD-10-CM | POA: Insufficient documentation

## 2015-10-18 DIAGNOSIS — K529 Noninfective gastroenteritis and colitis, unspecified: Secondary | ICD-10-CM | POA: Insufficient documentation

## 2015-10-18 DIAGNOSIS — Z880 Allergy status to penicillin: Secondary | ICD-10-CM | POA: Diagnosis not present

## 2015-10-18 DIAGNOSIS — Z794 Long term (current) use of insulin: Secondary | ICD-10-CM | POA: Diagnosis not present

## 2015-10-18 DIAGNOSIS — Z87891 Personal history of nicotine dependence: Secondary | ICD-10-CM | POA: Insufficient documentation

## 2015-10-18 DIAGNOSIS — Z8507 Personal history of malignant neoplasm of pancreas: Secondary | ICD-10-CM | POA: Insufficient documentation

## 2015-10-18 DIAGNOSIS — Z7982 Long term (current) use of aspirin: Secondary | ICD-10-CM | POA: Diagnosis not present

## 2015-10-18 DIAGNOSIS — R21 Rash and other nonspecific skin eruption: Secondary | ICD-10-CM | POA: Diagnosis not present

## 2015-10-18 DIAGNOSIS — K861 Other chronic pancreatitis: Secondary | ICD-10-CM | POA: Diagnosis not present

## 2015-10-18 DIAGNOSIS — M5136 Other intervertebral disc degeneration, lumbar region: Secondary | ICD-10-CM | POA: Diagnosis not present

## 2015-10-18 DIAGNOSIS — E875 Hyperkalemia: Secondary | ICD-10-CM | POA: Insufficient documentation

## 2015-10-18 DIAGNOSIS — I2510 Atherosclerotic heart disease of native coronary artery without angina pectoris: Secondary | ICD-10-CM | POA: Insufficient documentation

## 2015-10-18 DIAGNOSIS — M542 Cervicalgia: Secondary | ICD-10-CM | POA: Diagnosis not present

## 2015-10-18 DIAGNOSIS — Z955 Presence of coronary angioplasty implant and graft: Secondary | ICD-10-CM | POA: Diagnosis not present

## 2015-10-18 DIAGNOSIS — R443 Hallucinations, unspecified: Secondary | ICD-10-CM | POA: Diagnosis not present

## 2015-10-18 DIAGNOSIS — E119 Type 2 diabetes mellitus without complications: Secondary | ICD-10-CM | POA: Insufficient documentation

## 2015-10-18 DIAGNOSIS — R0602 Shortness of breath: Secondary | ICD-10-CM | POA: Insufficient documentation

## 2015-10-18 DIAGNOSIS — Z8673 Personal history of transient ischemic attack (TIA), and cerebral infarction without residual deficits: Secondary | ICD-10-CM | POA: Diagnosis not present

## 2015-10-18 DIAGNOSIS — R4182 Altered mental status, unspecified: Principal | ICD-10-CM | POA: Insufficient documentation

## 2015-10-18 DIAGNOSIS — E1165 Type 2 diabetes mellitus with hyperglycemia: Secondary | ICD-10-CM | POA: Diagnosis not present

## 2015-10-18 DIAGNOSIS — R591 Generalized enlarged lymph nodes: Secondary | ICD-10-CM | POA: Diagnosis not present

## 2015-10-18 LAB — COMPREHENSIVE METABOLIC PANEL
ALT: 23 U/L (ref 17–63)
ANION GAP: 5 (ref 5–15)
AST: 19 U/L (ref 15–41)
Albumin: 4.6 g/dL (ref 3.5–5.0)
Alkaline Phosphatase: 160 U/L — ABNORMAL HIGH (ref 38–126)
BUN: 7 mg/dL (ref 6–20)
CHLORIDE: 106 mmol/L (ref 101–111)
CO2: 24 mmol/L (ref 22–32)
Calcium: 8.6 mg/dL — ABNORMAL LOW (ref 8.9–10.3)
Creatinine, Ser: 0.64 mg/dL (ref 0.61–1.24)
Glucose, Bld: 92 mg/dL (ref 65–99)
POTASSIUM: 3.1 mmol/L — AB (ref 3.5–5.1)
SODIUM: 135 mmol/L (ref 135–145)
Total Bilirubin: 0.5 mg/dL (ref 0.3–1.2)
Total Protein: 7.4 g/dL (ref 6.5–8.1)

## 2015-10-18 LAB — CBC
HCT: 42.2 % (ref 40.0–52.0)
Hemoglobin: 14.4 g/dL (ref 13.0–18.0)
MCH: 31.7 pg (ref 26.0–34.0)
MCHC: 34 g/dL (ref 32.0–36.0)
MCV: 93 fL (ref 80.0–100.0)
PLATELETS: 199 10*3/uL (ref 150–440)
RBC: 4.54 MIL/uL (ref 4.40–5.90)
RDW: 13.8 % (ref 11.5–14.5)
WBC: 6.9 10*3/uL (ref 3.8–10.6)

## 2015-10-18 LAB — DIFFERENTIAL
BASOS PCT: 0 %
Basophils Absolute: 0 10*3/uL (ref 0–0.1)
EOS ABS: 0.1 10*3/uL (ref 0–0.7)
Eosinophils Relative: 1 %
Lymphocytes Relative: 26 %
Lymphs Abs: 1.8 10*3/uL (ref 1.0–3.6)
MONO ABS: 0.9 10*3/uL (ref 0.2–1.0)
Monocytes Relative: 13 %
NEUTROS ABS: 4.1 10*3/uL (ref 1.4–6.5)
Neutrophils Relative %: 60 %

## 2015-10-18 LAB — PROTIME-INR
INR: 1.17
PROTHROMBIN TIME: 15.1 s — AB (ref 11.4–15.0)

## 2015-10-18 LAB — GLUCOSE, CAPILLARY
GLUCOSE-CAPILLARY: 81 mg/dL (ref 65–99)
Glucose-Capillary: 152 mg/dL — ABNORMAL HIGH (ref 65–99)

## 2015-10-18 LAB — TROPONIN I

## 2015-10-18 LAB — APTT: APTT: 30 s (ref 24–36)

## 2015-10-18 MED ORDER — POTASSIUM CHLORIDE CRYS ER 20 MEQ PO TBCR
20.0000 meq | EXTENDED_RELEASE_TABLET | Freq: Once | ORAL | Status: AC
  Administered 2015-10-18: 20 meq via ORAL
  Filled 2015-10-18: qty 1

## 2015-10-18 MED ORDER — DEXTROSE 50 % IV SOLN
1.0000 | Freq: Once | INTRAVENOUS | Status: AC
  Administered 2015-10-18: 50 mL via INTRAVENOUS
  Filled 2015-10-18: qty 50

## 2015-10-18 MED ORDER — IOHEXOL 300 MG/ML  SOLN
75.0000 mL | Freq: Once | INTRAMUSCULAR | Status: AC | PRN
  Administered 2015-10-18: 75 mL via INTRAVENOUS

## 2015-10-18 MED ORDER — LORAZEPAM 2 MG/ML IJ SOLN
1.0000 mg | Freq: Once | INTRAMUSCULAR | Status: AC
  Administered 2015-10-18: 1 mg via INTRAVENOUS
  Filled 2015-10-18: qty 1

## 2015-10-18 NOTE — ED Notes (Addendum)
Spouse states pt with chest pain since 1800 today with confusion, shortness of breath and right sided neck pain. Pt states has had chest pain for 2 years. Pt alert to self only at this time. Pt with pwd skin, resps unlabored. Pt's spouse states "he's been slobbering and his blood sugar has been high."pt with slight left sided facial droop spouse believes is chronic.

## 2015-10-18 NOTE — ED Notes (Signed)
Pt moving all extremities without difficulty, with exception of left lower arm, pt with previous surgery to left lower arm and mobility difficulty per spouse. perrl 57mm and brisk when pt opens and closes eyes. Pt's spouse states "36" is "low for him" regarding blood sugar.

## 2015-10-18 NOTE — ED Notes (Signed)
Spoke with dr. Junita Push regarding pt's presenting symptoms.

## 2015-10-19 DIAGNOSIS — R410 Disorientation, unspecified: Secondary | ICD-10-CM | POA: Diagnosis not present

## 2015-10-19 DIAGNOSIS — I10 Essential (primary) hypertension: Secondary | ICD-10-CM | POA: Diagnosis not present

## 2015-10-19 DIAGNOSIS — E1165 Type 2 diabetes mellitus with hyperglycemia: Secondary | ICD-10-CM | POA: Diagnosis not present

## 2015-10-19 DIAGNOSIS — I2510 Atherosclerotic heart disease of native coronary artery without angina pectoris: Secondary | ICD-10-CM | POA: Diagnosis not present

## 2015-10-19 LAB — URINALYSIS COMPLETE WITH MICROSCOPIC (ARMC ONLY)
BILIRUBIN URINE: NEGATIVE
Bacteria, UA: NONE SEEN
Glucose, UA: 50 mg/dL — AB
Hgb urine dipstick: NEGATIVE
KETONES UR: NEGATIVE mg/dL
LEUKOCYTES UA: NEGATIVE
Nitrite: NEGATIVE
PH: 5 (ref 5.0–8.0)
Protein, ur: NEGATIVE mg/dL
Specific Gravity, Urine: 1.021 (ref 1.005–1.030)
Squamous Epithelial / LPF: NONE SEEN
WBC, UA: NONE SEEN WBC/hpf (ref 0–5)

## 2015-10-19 LAB — GLUCOSE, CAPILLARY
Glucose-Capillary: 159 mg/dL — ABNORMAL HIGH (ref 65–99)
Glucose-Capillary: 246 mg/dL — ABNORMAL HIGH (ref 65–99)
Glucose-Capillary: 224 mg/dL — ABNORMAL HIGH (ref 65–99)
Glucose-Capillary: 333 mg/dL — ABNORMAL HIGH (ref 65–99)
Glucose-Capillary: 186 mg/dL — ABNORMAL HIGH (ref 65–99)

## 2015-10-19 LAB — HEMOGLOBIN A1C: Hgb A1c MFr Bld: 10.1 % — ABNORMAL HIGH (ref 4.0–6.0)

## 2015-10-19 LAB — AMMONIA: Ammonia: 22 umol/L (ref 9–35)

## 2015-10-19 LAB — TSH: TSH: 2.122 u[IU]/mL (ref 0.350–4.500)

## 2015-10-19 MED ORDER — ENOXAPARIN SODIUM 40 MG/0.4ML ~~LOC~~ SOLN
40.0000 mg | SUBCUTANEOUS | Status: DC
  Administered 2015-10-19: 40 mg via SUBCUTANEOUS
  Filled 2015-10-19: qty 0.4

## 2015-10-19 MED ORDER — RISPERIDONE 1 MG PO TABS
1.0000 mg | ORAL_TABLET | Freq: Two times a day (BID) | ORAL | Status: DC
  Administered 2015-10-19: 17:00:00 1 mg via ORAL
  Filled 2015-10-19 (×2): qty 1

## 2015-10-19 MED ORDER — INSULIN ASPART 100 UNIT/ML ~~LOC~~ SOLN
0.0000 [IU] | Freq: Three times a day (TID) | SUBCUTANEOUS | Status: DC
  Administered 2015-10-19 (×2): 3 [IU] via SUBCUTANEOUS
  Administered 2015-10-19: 17:00:00 7 [IU] via SUBCUTANEOUS
  Filled 2015-10-19 (×2): qty 3
  Filled 2015-10-19: qty 7

## 2015-10-19 MED ORDER — CLOPIDOGREL BISULFATE 75 MG PO TABS
75.0000 mg | ORAL_TABLET | Freq: Every day | ORAL | Status: DC
  Administered 2015-10-19: 75 mg via ORAL
  Filled 2015-10-19: qty 1

## 2015-10-19 MED ORDER — LAMOTRIGINE 100 MG PO TABS
100.0000 mg | ORAL_TABLET | Freq: Two times a day (BID) | ORAL | Status: DC
  Administered 2015-10-19 (×3): 100 mg via ORAL
  Filled 2015-10-19 (×3): qty 1

## 2015-10-19 MED ORDER — ONDANSETRON HCL 4 MG/2ML IJ SOLN: 4.0000 mg | Freq: Four times a day (QID) | INTRAMUSCULAR | Status: DC | PRN

## 2015-10-19 MED ORDER — POTASSIUM CHLORIDE CRYS ER 10 MEQ PO TBCR
10.0000 meq | EXTENDED_RELEASE_TABLET | Freq: Two times a day (BID) | ORAL | Status: DC
  Administered 2015-10-19 (×2): 10 meq via ORAL
  Filled 2015-10-19 (×3): qty 1

## 2015-10-19 MED ORDER — ASPIRIN 81 MG PO CHEW
81.0000 mg | CHEWABLE_TABLET | Freq: Every day | ORAL | Status: DC
  Administered 2015-10-19: 11:00:00 81 mg via ORAL
  Filled 2015-10-19: qty 1

## 2015-10-19 MED ORDER — NITROGLYCERIN 0.4 MG SL SUBL: 0.4000 mg | SUBLINGUAL_TABLET | SUBLINGUAL | Status: DC | PRN

## 2015-10-19 MED ORDER — DOCUSATE SODIUM 100 MG PO CAPS
100.0000 mg | ORAL_CAPSULE | Freq: Two times a day (BID) | ORAL | Status: DC
  Administered 2015-10-19 (×2): 100 mg via ORAL
  Filled 2015-10-19 (×2): qty 1

## 2015-10-19 MED ORDER — DIPHENOXYLATE-ATROPINE 2.5-0.025 MG PO TABS
1.0000 | ORAL_TABLET | Freq: Four times a day (QID) | ORAL | Status: DC | PRN
  Administered 2015-10-19: 1 via ORAL
  Filled 2015-10-19: qty 1

## 2015-10-19 MED ORDER — ACETAMINOPHEN 650 MG RE SUPP: 650.0000 mg | Freq: Four times a day (QID) | RECTAL | Status: DC | PRN

## 2015-10-19 MED ORDER — ATORVASTATIN CALCIUM 20 MG PO TABS
80.0000 mg | ORAL_TABLET | Freq: Every day | ORAL | Status: DC
  Administered 2015-10-19: 11:00:00 80 mg via ORAL
  Filled 2015-10-19: qty 4

## 2015-10-19 MED ORDER — INSULIN GLARGINE 100 UNIT/ML ~~LOC~~ SOLN
40.0000 [IU] | Freq: Every day | SUBCUTANEOUS | Status: DC
Start: 2015-10-19 — End: 2015-10-20
  Administered 2015-10-19 (×2): 40 [IU] via SUBCUTANEOUS
  Filled 2015-10-19 (×3): qty 0.4

## 2015-10-19 MED ORDER — SODIUM CHLORIDE 0.9% FLUSH
3.0000 mL | Freq: Two times a day (BID) | INTRAVENOUS | Status: DC
  Administered 2015-10-19 (×2): 3 mL via INTRAVENOUS

## 2015-10-19 MED ORDER — POTASSIUM CHLORIDE IN NACL 40-0.9 MEQ/L-% IV SOLN
INTRAVENOUS | Status: DC
  Administered 2015-10-19: 125 mL/h via INTRAVENOUS
  Filled 2015-10-19 (×4): qty 1000

## 2015-10-19 MED ORDER — FUROSEMIDE 40 MG PO TABS
40.0000 mg | ORAL_TABLET | Freq: Every day | ORAL | Status: DC
  Administered 2015-10-19: 40 mg via ORAL
  Filled 2015-10-19: qty 1

## 2015-10-19 MED ORDER — ONDANSETRON HCL 4 MG PO TABS: 4.0000 mg | ORAL_TABLET | Freq: Four times a day (QID) | ORAL | Status: DC | PRN

## 2015-10-19 MED ORDER — ACETAMINOPHEN 325 MG PO TABS
650.0000 mg | ORAL_TABLET | Freq: Once | ORAL | Status: AC
  Administered 2015-10-20: 650 mg via ORAL
  Filled 2015-10-19: qty 2

## 2015-10-19 MED ORDER — CYCLOBENZAPRINE HCL 10 MG PO TABS
5.0000 mg | ORAL_TABLET | Freq: Three times a day (TID) | ORAL | Status: DC | PRN
  Administered 2015-10-19: 03:00:00 5 mg via ORAL
  Filled 2015-10-19: qty 1

## 2015-10-19 MED ORDER — LOSARTAN POTASSIUM 50 MG PO TABS
100.0000 mg | ORAL_TABLET | Freq: Every day | ORAL | Status: DC
  Administered 2015-10-19: 100 mg via ORAL
  Filled 2015-10-19: qty 2

## 2015-10-19 MED ORDER — METOPROLOL SUCCINATE ER 25 MG PO TB24
25.0000 mg | ORAL_TABLET | Freq: Every day | ORAL | Status: DC
  Administered 2015-10-19: 25 mg via ORAL
  Filled 2015-10-19: qty 1

## 2015-10-19 MED ORDER — DOUBLE ANTIBIOTIC 500-10000 UNIT/GM EX OINT
TOPICAL_OINTMENT | Freq: Two times a day (BID) | CUTANEOUS | Status: DC
  Administered 2015-10-19 (×2): 1 via TOPICAL
  Filled 2015-10-19 (×8): qty 1

## 2015-10-19 MED ORDER — DIPHENHYDRAMINE HCL 50 MG/ML IJ SOLN
12.5000 mg | Freq: Every evening | INTRAMUSCULAR | Status: DC | PRN
  Administered 2015-10-19 – 2015-10-20 (×2): 12.5 mg via INTRAVENOUS
  Filled 2015-10-19 (×4): qty 1

## 2015-10-19 MED ORDER — ACETAMINOPHEN 325 MG PO TABS
650.0000 mg | ORAL_TABLET | Freq: Four times a day (QID) | ORAL | Status: DC | PRN
  Administered 2015-10-19 (×3): 650 mg via ORAL
  Filled 2015-10-19 (×4): qty 2

## 2015-10-19 NOTE — Plan of Care (Signed)
Problem: Pain Managment: Goal: General experience of comfort will improve Outcome: Progressing Pt given Tylenol and Flexeril for neck pain with relief. C/o neck spasms. Wife at bedside, supportive.

## 2015-10-19 NOTE — Consult Note (Signed)
CC: confusion   HPI: Maurice Patterson is an 60 y.o. male well known to my service with frequent admissions for confusion and delirium. Pt has also partial seizures from temporal lobe that are well controlled with Lamictal.  He is currently on lamictal 100mg  BID As per family at bedside pt has increased confusion and has not slept since Friday AM. S/p imaging that didn't show acute abnormality.   Past Medical History  Diagnosis Date  . Seizures ([place])     staring spells  . Memory loss   . Arthritis   . Stroke ([place])   . Hypertension   . Diabetes mellitus without complication ([place])   . Malignant intraductal papillary mucinous tumor of pancreas ([place])   . Hernia of abdominal cavity   . Degenerative lumbar disc   . Acute MI ([place])   . TIA (transient ischemic attack)     Past Surgical History  Procedure Laterality Date  . Left arm metal plate    . Stent times 2  2013    Cardiac  . Left shoulder surgery    . Ercp N/A 09/12/2015    Procedure: ENDOSCOPIC RETROGRADE CHOLANGIOPANCREATOGRAPHY (ERCP);  Surgeon: Hulen Luster, MD;  Location: [facility] ENDOSCOPY;  Service: Gastroenterology;  Laterality: N/A;    Family History  Problem Relation Age of Onset  . Diabetes Mother   . Hypertension Mother   . Alzheimer's disease Father     Social History:  reports that he has quit smoking. He has never used smokeless tobacco. He reports that he does not drink alcohol or use illicit drugs.  Allergies  Allergen Reactions  . Hydrocodone Anaphylaxis  . Morphine Other (See Comments)  . Ambien [Zolpidem] Other (See Comments)    delirium    . Brilinta [Ticagrelor] Other (See Comments)    Stroke   . Levetiracetam Diarrhea and Other (See Comments)    Unable to walk  . Lorazepam Hives  . Trazodone Other (See Comments)  . Penicillins Rash    Has patient had a PCN reaction causing immediate rash, facial/tongue/throat swelling, SOB or lightheadedness with hypotension: Yes Has patient had a PCN reaction  causing severe rash involving mucus membranes or skin necrosis: No Has patient had a PCN reaction that required hospitalization No Has patient had a PCN reaction occurring within the last 10 years: Yes If all of the above answers are "NO", then may proceed with Cephalosporin use.    Medications: I have reviewed the patient's current medications.  ROS: Unable to obtain due to confusion, but he did recognize me  Physical Examination: Blood pressure 178/92, pulse 117, temperature 97.8 F (36.6 C), temperature source Oral, resp. rate 18, height 5\' 6"  (1.676 m), weight 165 lb (74.844 kg), SpO2 99 %.    Neurological Examination Mental Status: Alert to name  Cranial Nerves: II: Discs flat bilaterally; Visual fields grossly normal, pupils equal, round, reactive to light and accommodation III,IV, VI: ptosis not present, extra-ocular motions intact bilaterally V,VII: smile symmetric, facial light touch sensation normal bilaterally VIII: hearing normal bilaterally IX,X: gag reflex present XI: bilateral shoulder shrug XII: midline tongue extension Motor: Right : Upper extremity   5/5    Left:     Upper extremity   5/5  Lower extremity   5/5     Lower extremity   5/5 Tone and bulk:normal tone throughout; no atrophy noted Sensory: Pinprick and light touch intact throughout, bilaterally Deep Tendon Reflexes: 1+ and symmetric throughout Plantars: Right: downgoing   Left: downgoing  Cerebellar: normal finger-to-nose, normal rapid alternating movements and normal heel-to-shin test Gait: normal gait and station      Laboratory Studies:   Basic Metabolic Panel:  Recent Labs Lab 10/18/15 2011  NA 135  K 3.1*  CL 106  CO2 24  GLUCOSE 92  BUN 7  CREATININE 0.64  CALCIUM 8.6*    Liver Function Tests:  Recent Labs Lab 10/18/15 2011  AST 19  ALT 23  ALKPHOS 160*  BILITOT 0.5  PROT 7.4  ALBUMIN 4.6   No results for input(s): LIPASE, AMYLASE in the last 168 hours.  Recent  Labs Lab 10/19/15 0311  AMMONIA 22    CBC:  Recent Labs Lab 10/18/15 2011  WBC 6.9  NEUTROABS 4.1  HGB 14.4  HCT 42.2  MCV 93.0  PLT 199    Cardiac Enzymes:  Recent Labs Lab 10/18/15 2011  TROPONINI <0.03    BNP: Invalid input(s): POCBNP  CBG:  Recent Labs Lab 10/18/15 2017 10/18/15 2331 10/19/15 0117 10/19/15 0739 10/19/15 1116  GLUCAP 81 152* 159* 246* 224*    Microbiology: Results for orders placed or performed during the hospital encounter of 08/21/15  Culture, blood (routine x 2)     Status: None   Collection Time: 08/21/15  5:32 PM  Result Value Ref Range Status   Specimen Description BLOOD LEFT HAND  Final   Special Requests BOTTLES DRAWN AEROBIC AND ANAEROBIC  1CC  Final   Culture NO GROWTH 5 DAYS  Final   Report Status 08/26/2015 FINAL  Final  Culture, blood (routine x 2)     Status: None   Collection Time: 08/21/15  5:32 PM  Result Value Ref Range Status   Specimen Description BLOOD RIGHT ANTECUBITAL  Final   Special Requests BOTTLES DRAWN AEROBIC AND ANAEROBIC  5CC  Final   Culture NO GROWTH 5 DAYS  Final   Report Status 08/26/2015 FINAL  Final  Culture, expectorated sputum-assessment     Status: None   Collection Time: 08/22/15  5:13 PM  Result Value Ref Range Status   Specimen Description EXPECTORATED SPUTUM  Final   Special Requests Normal  Final   Sputum evaluation THIS SPECIMEN IS ACCEPTABLE FOR SPUTUM CULTURE  Final   Report Status 08/22/2015 FINAL  Final  Culture, respiratory (NON-Expectorated)     Status: None   Collection Time: 08/22/15  5:13 PM  Result Value Ref Range Status   Specimen Description EXPECTORATED SPUTUM  Final   Special Requests Normal Reflexed from [id number]  Final   Gram Stain   Final    GOOD SPECIMEN - 80-90% WBCS MODERATE WBC SEEN NO ORGANISMS SEEN    Culture Consistent with normal respiratory flora.  Final   Report Status 08/25/2015 FINAL  Final    Coagulation Studies:  Recent Labs  10/18/15 2011   LABPROT 15.1*  INR 1.17    Urinalysis:  Recent Labs Lab 10/19/15 0235  COLORURINE STRAW*  LABSPEC 1.021  PHURINE 5.0  GLUCOSEU 50*  HGBUR NEGATIVE  BILIRUBINUR NEGATIVE  KETONESUR NEGATIVE  PROTEINUR NEGATIVE  NITRITE NEGATIVE  LEUKOCYTESUR NEGATIVE    Lipid Panel:     Component Value Date/Time   CHOL 73 09/19/2014 0407   TRIG 98 09/19/2014 0407   HDL 23* 09/19/2014 0407   VLDL 20 09/19/2014 0407   LDLCALC 30 09/19/2014 0407    HgbA1C:  Lab Results  Component Value Date   HGBA1C 11.4* 08/22/2015    Urine Drug Screen:  No results found for: LABOPIA, COCAINSCRNUR,  LABBENZ, AMPHETMU, THCU, LABBARB  Alcohol Level: No results for input(s): ETH in the last 168 hours.   Imaging: Dg Chest 2 View  10/18/2015  CLINICAL DATA:  Shortness of Breath EXAM: CHEST  2 VIEW COMPARISON:  08/21/2015 FINDINGS: Cardiac shadow is within normal limits. The lungs are well aerated bilaterally. Old healed rib fractures are noted on the right posteriorly. No acute abnormality is seen. IMPRESSION: No active cardiopulmonary disease. Electronically Signed   By: Inez Catalina M.D.   On: 10/18/2015 21:46   Ct Head Wo Contrast  10/18/2015  CLINICAL DATA:  Patient with progressive confusion. EXAM: CT HEAD WITHOUT CONTRAST TECHNIQUE: Contiguous axial images were obtained from the base of the skull through the vertex without intravenous contrast. COMPARISON:  Brain CT 08/21/2015. FINDINGS: Ventricles and sulci are appropriate for patient's age. No evidence for acute cortically based infarct, intracranial hemorrhage, mass lesion or mass-effect. Orbits are unremarkable. Mucosal thickening involving the sphenoid sinus. Remainder of the paranasal sinuses are unremarkable. Mastoid air cells are well aerated. Calvarium is intact. IMPRESSION: No acute intracranial process. Electronically Signed   By: Lovey Newcomer M.D.   On: 10/18/2015 20:31   Ct Soft Tissue Neck W Contrast  10/18/2015  CLINICAL DATA:  Chest pain  beginning 1800 hours today, confusion, shortness of breath and RIGHT neck pain. History of pancreatic cancer, diabetes. EXAM: CT NECK WITH CONTRAST TECHNIQUE: Multidetector CT imaging of the neck was performed using the standard protocol following the bolus administration of intravenous contrast. CONTRAST:  68mL OMNIPAQUE IOHEXOL 300 MG/ML  SOLN COMPARISON:  CT head October 18, 2015 FINDINGS: Pharynx and larynx: Nonacute. Punctate tonsilliths. Otherwise negative. Salivary glands: Normal. Thyroid: Normal. Lymph nodes: No lymphadenopathy by CT size criteria. Subcentimeter RIGHT level IIa reniform lymph node is likely reactive. Vascular: Mild calcific atherosclerosis of the aortic arch and carotid bifurcations. Limited intracranial: Normal. Visualized orbits: Normal. Punctate RIGHT periorbital dermal calcifications. Mastoids and visualized paranasal sinuses: Moderate RIGHT sphenoid mucosal thickening, no paranasal sinus air-fluid levels. The imaged mastoid air cells are well aerated. Skeleton: No destructive bony lesions. Very mild degenerative changes cervical spine. Patient is edentulous. Upper chest: Mild centrilobular emphysema. Subcentimeter mediastinal lymph nodes may be reactive. IMPRESSION: Negative CT neck. Electronically Signed   By: Elon Alas M.D.   On: 10/18/2015 22:21     Assessment/Plan:   60 y.o. male well known to my service with frequent admissions for confusion and delirium. Pt has also partial seizures from temporal lobe that are well controlled with Lamictal.  He is currently on lamictal 100mg  BID As per family at bedside pt has increased confusion and has not slept since Friday AM. S/p imaging that didn't show acute abnormality.    This is similar to previous presentations of acute delirium.   Pt does not tolerate benzodiazepines well   - Will try atypical antipsychotic. Risperidal 1mg  BID ordered as family states Seroquel did not work well for him and caused more agitation -  d/c planning later today or tomorrow  - no further imaging from neuro stand point.    Leotis Pain  10/19/2015, 12:56 PM

## 2015-10-19 NOTE — Progress Notes (Addendum)
Pt is very restless, does not follow commands, answers all orientation questions appropriately. Has not slept all night, wandering around the room, twisting himself in his IV and telemetry line. Pt's wife states she is very concerned for patient. Refuses to have a sitter come in or try any medications related to Ativan at this time. Pt's wife states that he is delirious and will get worse, especially due to the Flexeril and Ativan given earlier. Requests MD to come and talk to her, wants to take patient home because she feels that her husband is a "[place] pig here". Pt's wife appears tearful, upset. Dr. Marcille Blanco notified, night time benadryl given per MD order, PRN sitter ordered. Dr. Marcille Blanco states he will come to see patient as soon as he can.

## 2015-10-19 NOTE — Care Management Obs Status (Signed)
[place] NOTIFICATION   Patient Details  Name: KAMARRI GANDY MRN: [id number] Date of Birth: 10/27/1955   Medicare Observation Status Notification Given:  Yes (confusion )    Ival Bible, RN 10/19/2015, 2:46 PM

## 2015-10-19 NOTE — Progress Notes (Signed)
[facility] NAME: Maurice Patterson    MR#:  [id number]  DATE OF BIRTH:  08/12/1956  SUBJECTIVE:   Patient is here due to altered mental status/confusion. Also complaining of right-sided neck pain. Family is at bedside.  Mental status Improved since yesterday.    REVIEW OF SYSTEMS:    Review of Systems  Constitutional: Negative for fever and chills.  HENT: Negative for congestion and tinnitus.   Eyes: Negative for blurred vision and double vision.  Respiratory: Negative for cough, shortness of breath and wheezing.   Cardiovascular: Negative for chest pain, orthopnea and PND.  Gastrointestinal: Negative for nausea, vomiting, abdominal pain and diarrhea.  Genitourinary: Negative for dysuria and hematuria.  Musculoskeletal: Positive for neck pain.  Neurological: Negative for dizziness, sensory change and focal weakness.  All other systems reviewed and are negative.   Nutrition: Heart Healthy/Carb modified.  Tolerating Diet: Yes Tolerating PT: Ambulatory.   DRUG ALLERGIES:   Allergies  Allergen Reactions  . Hydrocodone Anaphylaxis  . Morphine Other (See Comments)  . Ambien [Zolpidem] Other (See Comments)    delirium    . Brilinta [Ticagrelor] Other (See Comments)    Stroke   . Levetiracetam Diarrhea and Other (See Comments)    Unable to walk  . Lorazepam Hives  . Trazodone Other (See Comments)  . Penicillins Rash    Has patient had a PCN reaction causing immediate rash, facial/tongue/throat swelling, SOB or lightheadedness with hypotension: Yes Has patient had a PCN reaction causing severe rash involving mucus membranes or skin necrosis: No Has patient had a PCN reaction that required hospitalization No Has patient had a PCN reaction occurring within the last 10 years: Yes If all of the above answers are "NO", then may proceed with Cephalosporin use.    VITALS:  Blood pressure 178/92, pulse 117, temperature  97.8 F (36.6 C), temperature source Oral, resp. rate 18, height 5\' 6"  (1.676 m), weight 74.844 kg (165 lb), SpO2 99 %.  PHYSICAL EXAMINATION:   Physical Exam  GENERAL:  60 y.o.-year-old patient sitting up in bed in no acute distress.  EYES: Pupils equal, round, reactive to light and accommodation. No scleral icterus. Extraocular muscles intact.  HEENT: Head atraumatic, normocephalic. Oropharynx and nasopharynx clear. Some drainage and open skin area behind right ear.   NECK:  Supple, no jugular venous distention. No thyroid enlargement, no tenderness.  LUNGS: Normal breath sounds bilaterally, no wheezing, rales, rhonchi. No use of accessory muscles of respiration.  CARDIOVASCULAR: S1, S2 normal. No murmurs, rubs, or gallops.  ABDOMEN: Soft, nontender, nondistended. Bowel sounds present. No organomegaly or mass.  EXTREMITIES: No cyanosis, clubbing or edema b/l.    NEUROLOGIC: Cranial nerves II through XII are intact. No focal Motor or sensory deficits b/l.   PSYCHIATRIC: The patient is alert and oriented x 2.  SKIN: No obvious rash, lesion, or ulcer.    LABORATORY PANEL:   CBC  Recent Labs Lab 10/18/15 2011  WBC 6.9  HGB 14.4  HCT 42.2  PLT 199   ------------------------------------------------------------------------------------------------------------------  Chemistries   Recent Labs Lab 10/18/15 2011  NA 135  K 3.1*  CL 106  CO2 24  GLUCOSE 92  BUN 7  CREATININE 0.64  CALCIUM 8.6*  AST 19  ALT 23  ALKPHOS 160*  BILITOT 0.5   ------------------------------------------------------------------------------------------------------------------  Cardiac Enzymes  Recent Labs Lab 10/18/15 2011  TROPONINI <0.03   ------------------------------------------------------------------------------------------------------------------  RADIOLOGY:  Dg Chest 2 View  10/18/2015  CLINICAL DATA:  Shortness of Breath EXAM: CHEST  2 VIEW COMPARISON:  08/21/2015 FINDINGS:  Cardiac shadow is within normal limits. The lungs are well aerated bilaterally. Old healed rib fractures are noted on the right posteriorly. No acute abnormality is seen. IMPRESSION: No active cardiopulmonary disease. Electronically Signed   By: Inez Catalina M.D.   On: 10/18/2015 21:46   Ct Head Wo Contrast  10/18/2015  CLINICAL DATA:  Patient with progressive confusion. EXAM: CT HEAD WITHOUT CONTRAST TECHNIQUE: Contiguous axial images were obtained from the base of the skull through the vertex without intravenous contrast. COMPARISON:  Brain CT 08/21/2015. FINDINGS: Ventricles and sulci are appropriate for patient's age. No evidence for acute cortically based infarct, intracranial hemorrhage, mass lesion or mass-effect. Orbits are unremarkable. Mucosal thickening involving the sphenoid sinus. Remainder of the paranasal sinuses are unremarkable. Mastoid air cells are well aerated. Calvarium is intact. IMPRESSION: No acute intracranial process. Electronically Signed   By: Lovey Newcomer M.D.   On: 10/18/2015 20:31   Ct Soft Tissue Neck W Contrast  10/18/2015  CLINICAL DATA:  Chest pain beginning 1800 hours today, confusion, shortness of breath and RIGHT neck pain. History of pancreatic cancer, diabetes. EXAM: CT NECK WITH CONTRAST TECHNIQUE: Multidetector CT imaging of the neck was performed using the standard protocol following the bolus administration of intravenous contrast. CONTRAST:  71mL OMNIPAQUE IOHEXOL 300 MG/ML  SOLN COMPARISON:  CT head October 18, 2015 FINDINGS: Pharynx and larynx: Nonacute. Punctate tonsilliths. Otherwise negative. Salivary glands: Normal. Thyroid: Normal. Lymph nodes: No lymphadenopathy by CT size criteria. Subcentimeter RIGHT level IIa reniform lymph node is likely reactive. Vascular: Mild calcific atherosclerosis of the aortic arch and carotid bifurcations. Limited intracranial: Normal. Visualized orbits: Normal. Punctate RIGHT periorbital dermal calcifications. Mastoids and  visualized paranasal sinuses: Moderate RIGHT sphenoid mucosal thickening, no paranasal sinus air-fluid levels. The imaged mastoid air cells are well aerated. Skeleton: No destructive bony lesions. Very mild degenerative changes cervical spine. Patient is edentulous. Upper chest: Mild centrilobular emphysema. Subcentimeter mediastinal lymph nodes may be reactive. IMPRESSION: Negative CT neck. Electronically Signed   By: Elon Alas M.D.   On: 10/18/2015 22:21     ASSESSMENT AND PLAN:   60 year old male with past medical history of seizures, memory loss, history of previous CVA, hypertension, diabetes type 2 without complication, history of previous TIA who presents to the hospital due to altered mental status.   #1 altered mental status-this is acute delirium and probably related to polypharmacy. There is no evidence of acute intracranial pathology as patient CT head and neck are negative. -Patient received some Ativan, Flexeril and also Benadryl prior to getting a CT scan yesterday which led to his delirium. He also hasn't slept in a few days. -Appreciate nephrology input and they have started the patient on some low-dose Risperdal and monitor mental status.  #2 diabetes type 2 without complication-continue Lantus, sliding scale insulin.  #3 history of seizures-continue Lamictal. No acute seizure type activity presently.   #4 hypertension-continue metoprolol, losartan.   #5 hyperkalemia continue to supplement and repeat level tomorrow.   #6 history of previous CVA-continue aspirin, Plavix, statin.   All the records are reviewed and case discussed with Care Management/Social Workerr. Management plans discussed with the patient, family and they are in agreement.  CODE STATUS: Full  DVT Prophylaxis: Lovenox  TOTAL TIME TAKING CARE OF THIS PATIENT: 40 minutes.   POSSIBLE D/C IN 1-2 DAYS, DEPENDING ON CLINICAL CONDITION.  Greater then 50% of time spent in coordination  of care and  discussing plan of care with the patient's family, neurologist.   Henreitta Leber M.D on 10/19/2015 at 1:30 PM  Between 7am to 6pm - Pager - [phone]  After 6pm go to [email or web address] - password [email or web address] [facility]  Office  [phone]  CC: Primary care physician; [name], MD

## 2015-10-19 NOTE — H&P (Signed)
Maurice Patterson is an 60 y.o. male.   Chief Complaint: Altered mental status HPI: The patient presents to the emergency department after an episode of altered mental status this afternoon. The patient was in his usual state of health last night when he mentioned that his neck hurt. He awoke and went about his day as usual. At his grandchildren's birthday party he suffered a staring spell during which she started to drool out of the left corner of his mouth. The patient remained conscious and denied any weakness. His waist was not particularly slurred but he was not always coherent. He also mentioned that his chest hurt at the time and his wife notes that a palpable and visible mass was present in the middle of his chest. Currently the patient denies chest pain, shortness of breath, nausea or vomiting. He denies diaphoresis during the episode as well. Past medical history is significant for intermittent temporal lobe seizures for which the patient was prescribed Lamictal. He was seen by neurology last month and found to be stable with instructions to continue antiepileptic medication. Past medical history is also significant for stroke but CT of his head in the emergency department does not show any cerebral ischemia. Interestingly, the patient states that his head continues to hurt and points to an area behind his right ear. Due to his multiple complaints and obviously altered sensorium the emergency department staff called for admission.  Past Medical History  Diagnosis Date  . Seizures ([place])     staring spells  . Memory loss   . Arthritis   . Stroke ([place])   . Hypertension   . Diabetes mellitus without complication ([place])   . Malignant intraductal papillary mucinous tumor of pancreas ([place])   . Hernia of abdominal cavity   . Degenerative lumbar disc   . Acute MI ([place])   . TIA (transient ischemic attack)     Past Surgical History  Procedure Laterality Date  . Left arm metal plate    . Stent  times 2  2013    Cardiac  . Left shoulder surgery    . Ercp N/A 09/12/2015    Procedure: ENDOSCOPIC RETROGRADE CHOLANGIOPANCREATOGRAPHY (ERCP);  Surgeon: Hulen Luster, MD;  Location: [facility] ENDOSCOPY;  Service: Gastroenterology;  Laterality: N/A;    Family History  Problem Relation Age of Onset  . Diabetes Mother   . Hypertension Mother   . Alzheimer's disease Father    Social History:  reports that he has quit smoking. He has never used smokeless tobacco. He reports that he does not drink alcohol or use illicit drugs.  Allergies:  Allergies  Allergen Reactions  . Hydrocodone Anaphylaxis  . Morphine Other (See Comments)  . Ambien [Zolpidem] Other (See Comments)    delirium    . Brilinta [Ticagrelor] Other (See Comments)    Stroke   . Levetiracetam Diarrhea and Other (See Comments)    Unable to walk  . Lorazepam Hives  . Trazodone Other (See Comments)  . Penicillins Rash    Has patient had a PCN reaction causing immediate rash, facial/tongue/throat swelling, SOB or lightheadedness with hypotension: Yes Has patient had a PCN reaction causing severe rash involving mucus membranes or skin necrosis: No Has patient had a PCN reaction that required hospitalization No Has patient had a PCN reaction occurring within the last 10 years: Yes If all of the above answers are "NO", then may proceed with Cephalosporin use.    Prior to Admission medications   Medication Sig  Start Date End Date Taking? Authorizing Provider  Acetaminophen 500 MG coapsule Take 500 mg by mouth 2 (two) times daily.   Yes Historical Provider, MD  aspirin 81 MG tablet Take 81 mg by mouth daily.   Yes Historical Provider, MD  atorvastatin (LIPITOR) 80 MG tablet Take 1 tablet by mouth daily. 03/12/15  Yes Historical Provider, MD  clopidogrel (PLAVIX) 75 MG tablet Take 1 tablet by mouth daily.   Yes Historical Provider, MD  diphenoxylate-atropine (LOMOTIL) 2.5-0.025 MG tablet Take 1 tablet by mouth 4 (four) times daily as  needed for diarrhea or loose stools. Patient taking differently: Take 1 tablet by mouth 4 (four) times daily as needed for diarrhea or loose stools. Wife reports takes 6 per day 05/27/15 05/26/16 Yes Joanne Gavel, MD  furosemide (LASIX) 40 MG tablet Take 1 tablet by mouth daily.   Yes Historical Provider, MD  insulin aspart (NOVOLOG FLEXPEN) 100 UNIT/ML FlexPen Inject 30 Units into the skin 3 (three) times daily.  03/18/15  Yes Historical Provider, MD  insulin glargine (LANTUS) 100 UNIT/ML injection Inject 0.8 mLs (80 Units total) into the skin at bedtime. 08/23/15  Yes Theodoro Grist, MD  lamoTRIgine (LAMICTAL) 100 MG tablet Take 100 mg by mouth 2 (two) times daily.   Yes Historical Provider, MD  levofloxacin (LEVAQUIN) 500 MG tablet Take 1 tablet (500 mg total) by mouth daily. 08/23/15  Yes Theodoro Grist, MD  losartan (COZAAR) 100 MG tablet Take 1 tablet by mouth daily. 04/28/14  Yes Historical Provider, MD  metoprolol succinate (TOPROL-XL) 25 MG 24 hr tablet Take 1 tablet by mouth daily.   Yes Historical Provider, MD  nitroGLYCERIN (NITROSTAT) 0.4 MG SL tablet Place 1 tablet under the tongue every 5 (five) minutes as needed for chest pain.    Yes Historical Provider, MD  omeprazole (PRILOSEC) 20 MG capsule Take 1 capsule by mouth daily. 05/20/15  Yes Historical Provider, MD  polyethylene glycol (MIRALAX / GLYCOLAX) packet Take 17 g by mouth daily. Patient taking differently: Take 17 g by mouth daily as needed for mild constipation.  08/23/15  Yes Theodoro Grist, MD  potassium chloride (K-DUR) 10 MEQ tablet Take 10 mEq by mouth 2 (two) times daily.   Yes Historical Provider, MD  guaiFENesin-codeine 100-10 MG/5ML syrup 5-10 ml po qhs prn Patient not taking: Reported on 08/21/2015 08/08/15   Norval Gable, MD     Results for orders placed or performed during the hospital encounter of 10/18/15 (from the past 48 hour(s))  Protime-INR     Status: Abnormal   Collection Time: 10/18/15  8:11 PM  Result  Value Ref Range   Prothrombin Time 15.1 (H) 11.4 - 15.0 seconds   INR 1.17   APTT     Status: None   Collection Time: 10/18/15  8:11 PM  Result Value Ref Range   aPTT 30 24 - 36 seconds  CBC     Status: None   Collection Time: 10/18/15  8:11 PM  Result Value Ref Range   WBC 6.9 3.8 - 10.6 K/uL   RBC 4.54 4.40 - 5.90 MIL/uL   Hemoglobin 14.4 13.0 - 18.0 g/dL   HCT 42.2 40.0 - 52.0 %   MCV 93.0 80.0 - 100.0 fL   MCH 31.7 26.0 - 34.0 pg   MCHC 34.0 32.0 - 36.0 g/dL   RDW 13.8 11.5 - 14.5 %   Platelets 199 150 - 440 K/uL  Differential     Status: None   Collection Time:  10/18/15  8:11 PM  Result Value Ref Range   Neutrophils Relative % 60 %   Neutro Abs 4.1 1.4 - 6.5 K/uL   Lymphocytes Relative 26 %   Lymphs Abs 1.8 1.0 - 3.6 K/uL   Monocytes Relative 13 %   Monocytes Absolute 0.9 0.2 - 1.0 K/uL   Eosinophils Relative 1 %   Eosinophils Absolute 0.1 0 - 0.7 K/uL   Basophils Relative 0 %   Basophils Absolute 0.0 0 - 0.1 K/uL  Comprehensive metabolic panel     Status: Abnormal   Collection Time: 10/18/15  8:11 PM  Result Value Ref Range   Sodium 135 135 - 145 mmol/L   Potassium 3.1 (L) 3.5 - 5.1 mmol/L   Chloride 106 101 - 111 mmol/L   CO2 24 22 - 32 mmol/L   Glucose, Bld 92 65 - 99 mg/dL   BUN 7 6 - 20 mg/dL   Creatinine, Ser 0.64 0.61 - 1.24 mg/dL   Calcium 8.6 (L) 8.9 - 10.3 mg/dL   Total Protein 7.4 6.5 - 8.1 g/dL   Albumin 4.6 3.5 - 5.0 g/dL   AST 19 15 - 41 U/L   ALT 23 17 - 63 U/L   Alkaline Phosphatase 160 (H) 38 - 126 U/L   Total Bilirubin 0.5 0.3 - 1.2 mg/dL   GFR calc non Af Amer >60 >60 mL/min   GFR calc Af Amer >60 >60 mL/min    Comment: (NOTE) The eGFR has been calculated using the CKD EPI equation. This calculation has not been validated in all clinical situations. eGFR's persistently <60 mL/min signify possible Chronic Kidney Disease.    Anion gap 5 5 - 15  Troponin I     Status: None   Collection Time: 10/18/15  8:11 PM  Result Value Ref Range    Troponin I <0.03 <0.031 ng/mL    Comment:        NO INDICATION OF MYOCARDIAL INJURY.   Glucose, capillary     Status: None   Collection Time: 10/18/15  8:17 PM  Result Value Ref Range   Glucose-Capillary 81 65 - 99 mg/dL  Glucose, capillary     Status: Abnormal   Collection Time: 10/18/15 11:31 PM  Result Value Ref Range   Glucose-Capillary 152 (H) 65 - 99 mg/dL   Dg Chest 2 View  10/18/2015  CLINICAL DATA:  Shortness of Breath EXAM: CHEST  2 VIEW COMPARISON:  08/21/2015 FINDINGS: Cardiac shadow is within normal limits. The lungs are well aerated bilaterally. Old healed rib fractures are noted on the right posteriorly. No acute abnormality is seen. IMPRESSION: No active cardiopulmonary disease. Electronically Signed   By: Inez Catalina M.D.   On: 10/18/2015 21:46   Ct Head Wo Contrast  10/18/2015  CLINICAL DATA:  Patient with progressive confusion. EXAM: CT HEAD WITHOUT CONTRAST TECHNIQUE: Contiguous axial images were obtained from the base of the skull through the vertex without intravenous contrast. COMPARISON:  Brain CT 08/21/2015. FINDINGS: Ventricles and sulci are appropriate for patient's age. No evidence for acute cortically based infarct, intracranial hemorrhage, mass lesion or mass-effect. Orbits are unremarkable. Mucosal thickening involving the sphenoid sinus. Remainder of the paranasal sinuses are unremarkable. Mastoid air cells are well aerated. Calvarium is intact. IMPRESSION: No acute intracranial process. Electronically Signed   By: Lovey Newcomer M.D.   On: 10/18/2015 20:31   Ct Soft Tissue Neck W Contrast  10/18/2015  CLINICAL DATA:  Chest pain beginning 1800 hours today, confusion, shortness of  breath and RIGHT neck pain. History of pancreatic cancer, diabetes. EXAM: CT NECK WITH CONTRAST TECHNIQUE: Multidetector CT imaging of the neck was performed using the standard protocol following the bolus administration of intravenous contrast. CONTRAST:  66m OMNIPAQUE IOHEXOL 300 MG/ML   SOLN COMPARISON:  CT head October 18, 2015 FINDINGS: Pharynx and larynx: Nonacute. Punctate tonsilliths. Otherwise negative. Salivary glands: Normal. Thyroid: Normal. Lymph nodes: No lymphadenopathy by CT size criteria. Subcentimeter RIGHT level IIa reniform lymph node is likely reactive. Vascular: Mild calcific atherosclerosis of the aortic arch and carotid bifurcations. Limited intracranial: Normal. Visualized orbits: Normal. Punctate RIGHT periorbital dermal calcifications. Mastoids and visualized paranasal sinuses: Moderate RIGHT sphenoid mucosal thickening, no paranasal sinus air-fluid levels. The imaged mastoid air cells are well aerated. Skeleton: No destructive bony lesions. Very mild degenerative changes cervical spine. Patient is edentulous. Upper chest: Mild centrilobular emphysema. Subcentimeter mediastinal lymph nodes may be reactive. IMPRESSION: Negative CT neck. Electronically Signed   By: CElon AlasM.D.   On: 10/18/2015 22:21    Review of Systems  Constitutional: Positive for malaise/fatigue. Negative for fever and chills.  HENT: Negative for sore throat and tinnitus.   Eyes: Negative for blurred vision and redness.  Respiratory: Negative for cough and shortness of breath.   Cardiovascular: Negative for chest pain, palpitations, orthopnea and PND.  Gastrointestinal: Negative for nausea, vomiting, abdominal pain and diarrhea.  Genitourinary: Negative for dysuria, urgency and frequency.  Musculoskeletal: Positive for neck pain. Negative for myalgias and joint pain.  Skin: Negative for rash.       No lesions  Neurological: Negative for speech change, focal weakness and weakness.  Endo/Heme/Allergies: Does not bruise/bleed easily.       No temperature intolerance  Psychiatric/Behavioral: Negative for depression and suicidal ideas.    Blood pressure 160/100, pulse 102, temperature 97 F (36.1 C), temperature source Axillary, resp. rate 17, height 5' 6" (1.676 m), weight  74.844 kg (165 lb), SpO2 98 %. Physical Exam  Nursing note and vitals reviewed. Constitutional: He is oriented to person, place, and time. He appears well-developed and well-nourished. No distress.  HENT:  Head: Normocephalic and atraumatic.    Mouth/Throat: Oropharynx is clear and moist.  Eyes: Conjunctivae and EOM are normal. Pupils are equal, round, and reactive to light. No scleral icterus.  Neck: Normal range of motion. Neck supple. No JVD present. No tracheal deviation present. No thyromegaly present.  Cardiovascular: Normal rate, regular rhythm and normal heart sounds.  Exam reveals no gallop and no friction rub.   No murmur heard. Respiratory: Effort normal and breath sounds normal. No respiratory distress.  Prominent xiphoid process but no palpable masses  GI: Soft. Bowel sounds are normal. He exhibits no distension. There is no tenderness.  Genitourinary:  Deferred  Musculoskeletal: Normal range of motion. He exhibits no edema.  Varicose veins present lower extremities bilaterally  Lymphadenopathy:       Head (right side): Posterior auricular adenopathy present.    He has no cervical adenopathy.  Node is not mobile  Neurological: He is oriented to person, place, and time. No cranial nerve deficit.  Skin: Skin is warm and dry. No rash noted. No erythema.  Lower extremities discolored/bronzed   Psychiatric: He has a normal mood and affect. His behavior is normal. Judgment and thought content normal.     Assessment/Plan This is a 60year old male admitted for confusion as well as head and neck pain. 1. Confusion: This was the patient's presenting complaint however at this time he seems  to be less confused than he is suffering from some mental slowing. The patient is well-known to me and has presented with similar complaints in the past. Since our last meeting he had been started on Lamictal for temporal lobe seizures. Perhaps tonight's complaints for another episode of seizure  activity. We will continue epileptic medication but I will also check an ammonia level. Neurology consult ordered 2. Head and neck pain: Possibly multifactorial as the patient has a posterior auricular mass on the right which may be a lymph node but is remarkably solid and less mobile than I would expect for adenopathy. Also, the patient may not be able to articulate the postictal feeling he may have if the etiology of his mental slowing and confusion is seizure activity. 3. Diabetes mellitus type 2: The patient's blood sugar has been fluctuating a lot and was found to be greater than 350 in the emergency department followed by a blood sugar of 92. He received an amp of D50 in the emergency department and we will continue to monitor his blood sugars. I have cut his basal insulin dose to 40 units of Lantus daily at bedtime and sliding scale while hospitalized. Also, the patient recently had a pancreatic stent placed. His wife reports a significant portion of his pancreas to be degraded. In our records the patient has CT scans demonstrate chronic pancreatitis as well as pancreatic cyst but no mass. However, the patient's wife states that he has been seen by [facility] who has been planning on the procedure. The patient has a mass this could be contributing to his labile blood sugars as well as to the potential posterior auricular mass. Consider excisional biopsy of mass. 4. CAD: Stable. Continue aspirin and Plavix. Also continue Lasix 5. Essential hypertension: Continue metoprolol and losartan 6. Hyperlipidemia: Continue statin therapy 7. Chronic diarrhea: Likely related to labile blood sugars. Imodium as needed 8. DVT prophylaxis: Lovenox 9. GI prophylaxis: None as the patient is not critically ill The patient is a full code. Time spent on admission orders and patient care approximately 35 minutes  Harrie Foreman, MD 10/19/2015, 12:45 AM

## 2015-10-19 NOTE — Progress Notes (Signed)
08:00 Family members called - family upset re- patients status of being confused and restless and agitated at times. Wife wants MD called. Frustrated re- patient care and treatment. Also - small area noted behind patients right ear - dry skin, tiny broken area, minimal swelling. Patient complaining of discomfort in that area. Dr. Verdell Carmine called - notified re- situation.

## 2015-10-20 DIAGNOSIS — G9340 Encephalopathy, unspecified: Secondary | ICD-10-CM | POA: Diagnosis not present

## 2015-10-20 DIAGNOSIS — R21 Rash and other nonspecific skin eruption: Secondary | ICD-10-CM | POA: Diagnosis not present

## 2015-10-20 DIAGNOSIS — B952 Enterococcus as the cause of diseases classified elsewhere: Secondary | ICD-10-CM | POA: Diagnosis not present

## 2015-10-20 DIAGNOSIS — Z8673 Personal history of transient ischemic attack (TIA), and cerebral infarction without residual deficits: Secondary | ICD-10-CM | POA: Diagnosis not present

## 2015-10-20 DIAGNOSIS — I10 Essential (primary) hypertension: Secondary | ICD-10-CM | POA: Diagnosis not present

## 2015-10-20 DIAGNOSIS — E1165 Type 2 diabetes mellitus with hyperglycemia: Secondary | ICD-10-CM | POA: Diagnosis not present

## 2015-10-20 DIAGNOSIS — T426X5A Adverse effect of other antiepileptic and sedative-hypnotic drugs, initial encounter: Secondary | ICD-10-CM | POA: Diagnosis not present

## 2015-10-20 DIAGNOSIS — L539 Erythematous condition, unspecified: Secondary | ICD-10-CM | POA: Diagnosis not present

## 2015-10-20 DIAGNOSIS — E782 Mixed hyperlipidemia: Secondary | ICD-10-CM | POA: Diagnosis not present

## 2015-10-20 DIAGNOSIS — E11649 Type 2 diabetes mellitus with hypoglycemia without coma: Secondary | ICD-10-CM | POA: Diagnosis not present

## 2015-10-20 DIAGNOSIS — I2510 Atherosclerotic heart disease of native coronary artery without angina pectoris: Secondary | ICD-10-CM | POA: Diagnosis not present

## 2015-10-20 DIAGNOSIS — I509 Heart failure, unspecified: Secondary | ICD-10-CM | POA: Diagnosis not present

## 2015-10-20 DIAGNOSIS — A047 Enterocolitis due to Clostridium difficile: Secondary | ICD-10-CM | POA: Diagnosis not present

## 2015-10-20 DIAGNOSIS — K7581 Nonalcoholic steatohepatitis (NASH): Secondary | ICD-10-CM | POA: Diagnosis not present

## 2015-10-20 DIAGNOSIS — E440 Moderate protein-calorie malnutrition: Secondary | ICD-10-CM | POA: Diagnosis not present

## 2015-10-20 DIAGNOSIS — Z8669 Personal history of other diseases of the nervous system and sense organs: Secondary | ICD-10-CM | POA: Diagnosis not present

## 2015-10-20 DIAGNOSIS — K861 Other chronic pancreatitis: Secondary | ICD-10-CM | POA: Diagnosis not present

## 2015-10-20 DIAGNOSIS — G4089 Other seizures: Secondary | ICD-10-CM | POA: Diagnosis not present

## 2015-10-20 DIAGNOSIS — B029 Zoster without complications: Secondary | ICD-10-CM | POA: Diagnosis not present

## 2015-10-20 DIAGNOSIS — E119 Type 2 diabetes mellitus without complications: Secondary | ICD-10-CM | POA: Diagnosis not present

## 2015-10-20 DIAGNOSIS — G92 Toxic encephalopathy: Secondary | ICD-10-CM | POA: Diagnosis not present

## 2015-10-20 DIAGNOSIS — F05 Delirium due to known physiological condition: Secondary | ICD-10-CM | POA: Diagnosis not present

## 2015-10-20 DIAGNOSIS — R262 Difficulty in walking, not elsewhere classified: Secondary | ICD-10-CM | POA: Diagnosis not present

## 2015-10-20 DIAGNOSIS — R404 Transient alteration of awareness: Secondary | ICD-10-CM | POA: Diagnosis not present

## 2015-10-20 DIAGNOSIS — G309 Alzheimer's disease, unspecified: Secondary | ICD-10-CM | POA: Diagnosis not present

## 2015-10-20 DIAGNOSIS — K219 Gastro-esophageal reflux disease without esophagitis: Secondary | ICD-10-CM | POA: Diagnosis not present

## 2015-10-20 DIAGNOSIS — D136 Benign neoplasm of pancreas: Secondary | ICD-10-CM | POA: Diagnosis not present

## 2015-10-20 DIAGNOSIS — F5104 Psychophysiologic insomnia: Secondary | ICD-10-CM | POA: Diagnosis not present

## 2015-10-20 DIAGNOSIS — E876 Hypokalemia: Secondary | ICD-10-CM | POA: Diagnosis not present

## 2015-10-20 DIAGNOSIS — F0391 Unspecified dementia with behavioral disturbance: Secondary | ICD-10-CM | POA: Diagnosis not present

## 2015-10-20 DIAGNOSIS — G3109 Other frontotemporal dementia: Secondary | ICD-10-CM | POA: Diagnosis not present

## 2015-10-20 DIAGNOSIS — Z743 Need for continuous supervision: Secondary | ICD-10-CM | POA: Diagnosis not present

## 2015-10-20 DIAGNOSIS — Z794 Long term (current) use of insulin: Secondary | ICD-10-CM | POA: Diagnosis not present

## 2015-10-20 DIAGNOSIS — G9349 Other encephalopathy: Secondary | ICD-10-CM | POA: Diagnosis not present

## 2015-10-20 DIAGNOSIS — B020 Zoster encephalitis: Secondary | ICD-10-CM | POA: Diagnosis not present

## 2015-10-20 DIAGNOSIS — G4700 Insomnia, unspecified: Secondary | ICD-10-CM | POA: Diagnosis not present

## 2015-10-20 DIAGNOSIS — M545 Low back pain: Secondary | ICD-10-CM | POA: Diagnosis not present

## 2015-10-20 DIAGNOSIS — F6389 Other impulse disorders: Secondary | ICD-10-CM | POA: Diagnosis not present

## 2015-10-20 DIAGNOSIS — I110 Hypertensive heart disease with heart failure: Secondary | ICD-10-CM | POA: Diagnosis not present

## 2015-10-20 DIAGNOSIS — G3184 Mild cognitive impairment, so stated: Secondary | ICD-10-CM | POA: Diagnosis not present

## 2015-10-20 DIAGNOSIS — R410 Disorientation, unspecified: Secondary | ICD-10-CM | POA: Diagnosis not present

## 2015-10-20 DIAGNOSIS — E46 Unspecified protein-calorie malnutrition: Secondary | ICD-10-CM | POA: Diagnosis not present

## 2015-10-20 DIAGNOSIS — N390 Urinary tract infection, site not specified: Secondary | ICD-10-CM | POA: Diagnosis not present

## 2015-10-20 DIAGNOSIS — B9689 Other specified bacterial agents as the cause of diseases classified elsewhere: Secondary | ICD-10-CM | POA: Diagnosis not present

## 2015-10-20 DIAGNOSIS — F39 Unspecified mood [affective] disorder: Secondary | ICD-10-CM | POA: Diagnosis not present

## 2015-10-20 DIAGNOSIS — M519 Unspecified thoracic, thoracolumbar and lumbosacral intervertebral disc disorder: Secondary | ICD-10-CM | POA: Diagnosis not present

## 2015-10-20 DIAGNOSIS — D490 Neoplasm of unspecified behavior of digestive system: Secondary | ICD-10-CM | POA: Diagnosis not present

## 2015-10-20 DIAGNOSIS — R443 Hallucinations, unspecified: Secondary | ICD-10-CM | POA: Diagnosis not present

## 2015-10-20 DIAGNOSIS — G4709 Other insomnia: Secondary | ICD-10-CM | POA: Diagnosis not present

## 2015-10-20 DIAGNOSIS — M1990 Unspecified osteoarthritis, unspecified site: Secondary | ICD-10-CM | POA: Diagnosis not present

## 2015-10-20 DIAGNOSIS — F29 Unspecified psychosis not due to a substance or known physiological condition: Secondary | ICD-10-CM | POA: Diagnosis not present

## 2015-10-20 DIAGNOSIS — E784 Other hyperlipidemia: Secondary | ICD-10-CM | POA: Diagnosis not present

## 2015-10-20 DIAGNOSIS — R4182 Altered mental status, unspecified: Secondary | ICD-10-CM | POA: Diagnosis not present

## 2015-10-20 DIAGNOSIS — G40909 Epilepsy, unspecified, not intractable, without status epilepticus: Secondary | ICD-10-CM | POA: Diagnosis not present

## 2015-10-20 DIAGNOSIS — R079 Chest pain, unspecified: Secondary | ICD-10-CM | POA: Diagnosis not present

## 2015-10-20 LAB — URINE CULTURE

## 2015-10-20 MED ORDER — HALOPERIDOL LACTATE 5 MG/ML IJ SOLN
2.0000 mg | Freq: Once | INTRAMUSCULAR | Status: DC
  Filled 2015-10-20: qty 1

## 2015-10-20 NOTE — Progress Notes (Signed)
Wife took pt out of the unit AMA.

## 2015-10-20 NOTE — Progress Notes (Signed)
Patient has been up most of the shift wondering around the room and the hall, 12.5 mg of Benadryl was administered which did not help. Contacted wife to give an update on pt and mentioned to her that the Benadryl that had been given this morning did not help at all and that her husband was still up and wondering. Wife states "my husband is allergic to benadryl, call Dr. Marcille Blanco and tell him to release my husband now" she states that her husband is allergic to Benadryl. MD, Marcille Blanco aware and Nursing supervisor.

## 2015-10-20 NOTE — Progress Notes (Signed)
Nursing supervisor in room talking to pt's wife.

## 2015-10-21 DIAGNOSIS — G4700 Insomnia, unspecified: Secondary | ICD-10-CM | POA: Diagnosis not present

## 2015-10-21 DIAGNOSIS — E782 Mixed hyperlipidemia: Secondary | ICD-10-CM | POA: Diagnosis not present

## 2015-10-21 DIAGNOSIS — G40909 Epilepsy, unspecified, not intractable, without status epilepticus: Secondary | ICD-10-CM | POA: Diagnosis not present

## 2015-10-21 DIAGNOSIS — K7581 Nonalcoholic steatohepatitis (NASH): Secondary | ICD-10-CM | POA: Diagnosis not present

## 2015-10-21 DIAGNOSIS — G92 Toxic encephalopathy: Secondary | ICD-10-CM | POA: Diagnosis not present

## 2015-10-21 DIAGNOSIS — G3184 Mild cognitive impairment, so stated: Secondary | ICD-10-CM | POA: Diagnosis not present

## 2015-10-21 DIAGNOSIS — F0391 Unspecified dementia with behavioral disturbance: Secondary | ICD-10-CM | POA: Diagnosis not present

## 2015-10-21 DIAGNOSIS — L539 Erythematous condition, unspecified: Secondary | ICD-10-CM | POA: Diagnosis not present

## 2015-10-21 DIAGNOSIS — I110 Hypertensive heart disease with heart failure: Secondary | ICD-10-CM | POA: Diagnosis not present

## 2015-10-21 DIAGNOSIS — E876 Hypokalemia: Secondary | ICD-10-CM | POA: Diagnosis not present

## 2015-10-21 DIAGNOSIS — F5104 Psychophysiologic insomnia: Secondary | ICD-10-CM | POA: Diagnosis not present

## 2015-10-21 DIAGNOSIS — M1990 Unspecified osteoarthritis, unspecified site: Secondary | ICD-10-CM | POA: Diagnosis not present

## 2015-10-21 DIAGNOSIS — I2510 Atherosclerotic heart disease of native coronary artery without angina pectoris: Secondary | ICD-10-CM | POA: Diagnosis not present

## 2015-10-21 DIAGNOSIS — E11649 Type 2 diabetes mellitus with hypoglycemia without coma: Secondary | ICD-10-CM | POA: Diagnosis not present

## 2015-10-21 DIAGNOSIS — E440 Moderate protein-calorie malnutrition: Secondary | ICD-10-CM | POA: Diagnosis not present

## 2015-10-21 DIAGNOSIS — B029 Zoster without complications: Secondary | ICD-10-CM | POA: Diagnosis not present

## 2015-10-21 DIAGNOSIS — R410 Disorientation, unspecified: Secondary | ICD-10-CM | POA: Diagnosis not present

## 2015-10-21 DIAGNOSIS — K861 Other chronic pancreatitis: Secondary | ICD-10-CM | POA: Diagnosis not present

## 2015-10-21 DIAGNOSIS — E1165 Type 2 diabetes mellitus with hyperglycemia: Secondary | ICD-10-CM | POA: Diagnosis not present

## 2015-10-21 DIAGNOSIS — B952 Enterococcus as the cause of diseases classified elsewhere: Secondary | ICD-10-CM | POA: Diagnosis not present

## 2015-10-21 DIAGNOSIS — M545 Low back pain: Secondary | ICD-10-CM | POA: Diagnosis not present

## 2015-10-21 DIAGNOSIS — I509 Heart failure, unspecified: Secondary | ICD-10-CM | POA: Diagnosis not present

## 2015-10-21 DIAGNOSIS — K219 Gastro-esophageal reflux disease without esophagitis: Secondary | ICD-10-CM | POA: Diagnosis not present

## 2015-10-21 DIAGNOSIS — D490 Neoplasm of unspecified behavior of digestive system: Secondary | ICD-10-CM | POA: Diagnosis not present

## 2015-10-21 DIAGNOSIS — A047 Enterocolitis due to Clostridium difficile: Secondary | ICD-10-CM | POA: Diagnosis not present

## 2015-10-21 DIAGNOSIS — F05 Delirium due to known physiological condition: Secondary | ICD-10-CM | POA: Diagnosis not present

## 2015-10-21 DIAGNOSIS — T426X5A Adverse effect of other antiepileptic and sedative-hypnotic drugs, initial encounter: Secondary | ICD-10-CM | POA: Diagnosis not present

## 2015-10-22 DIAGNOSIS — R410 Disorientation, unspecified: Secondary | ICD-10-CM | POA: Diagnosis not present

## 2015-10-22 NOTE — Discharge Summary (Signed)
[facility] [place] NAME: Maurice Patterson    MR#:  [id number]  DATE OF BIRTH:  04/22/1956  DATE OF ADMISSION:  10/18/2015 ADMITTING PHYSICIAN: Harrie Foreman, MD  DATE OF DISCHARGE: 10/20/2015  7:10 AM  Pt. Left AMA.   PRIMARY CARE PHYSICIAN: ALDRIDGE,BARBARA, MD    ADMISSION DIAGNOSIS:  cp  DISCHARGE DIAGNOSIS:  Active Problems:   Confusion   SECONDARY DIAGNOSIS:   Past Medical History  Diagnosis Date  . Seizures ([place])     staring spells  . Memory loss   . Arthritis   . Stroke ([place])   . Hypertension   . Diabetes mellitus without complication ([place])   . Malignant intraductal papillary mucinous tumor of pancreas ([place])   . Hernia of abdominal cavity   . Degenerative lumbar disc   . Acute MI ([place])   . TIA (transient ischemic attack)     HOSPITAL COURSE:   60 year old male with past medical history of seizures, memory loss, history of previous CVA, hypertension, diabetes type 2 without complication, history of previous TIA who presents to the hospital due to altered mental status.  #1 altered mental status-this was acute delirium and probably related to polypharmacy. There was no evidence of acute intracranial pathology as patient CT head and neck are negative. -Patient received some Ativan, Flexeril and also Benadryl prior to getting a CT scan in the ER which led to his delirium. He had also not slept in a few days. -Appreciate Neurology input and they had started the patient on some low-dose Risperdal  #2 diabetes type 2 without complication-continue Lantus, sliding scale insulin.  #3 history of seizures-continue Lamictal. No acute seizure type activity presently.  #4 hypertension-continue metoprolol, losartan.  #5 hyperkalemia continue to supplement and repeat level tomorrow.   #6 history of previous CVA-continue aspirin, Plavix, statin  Patient left AGAINST MEDICAL ADVICE on the morning of  10/20/2015.   DISCHARGE CONDITIONS:    CONSULTS OBTAINED:  Treatment Team:  Leotis Pain, MD  DRUG ALLERGIES:   Allergies  Allergen Reactions  . Hydrocodone Anaphylaxis  . Morphine Other (See Comments)  . Ambien [Zolpidem] Other (See Comments)    delirium    . Brilinta [Ticagrelor] Other (See Comments)    Stroke   . Levetiracetam Diarrhea and Other (See Comments)    Unable to walk  . Lorazepam Hives  . Trazodone Other (See Comments)  . Penicillins Rash    Has patient had a PCN reaction causing immediate rash, facial/tongue/throat swelling, SOB or lightheadedness with hypotension: Yes Has patient had a PCN reaction causing severe rash involving mucus membranes or skin necrosis: No Has patient had a PCN reaction that required hospitalization No Has patient had a PCN reaction occurring within the last 10 years: Yes If all of the above answers are "NO", then may proceed with Cephalosporin use.    DISCHARGE MEDICATIONS:   Discharge Medication List as of 10/20/2015  8:01 AM    CONTINUE these medications which have NOT CHANGED   Details  Acetaminophen 500 MG coapsule Take 500 mg by mouth 2 (two) times daily., Until Discontinued, Historical Med    aspirin 81 MG tablet Take 81 mg by mouth daily., Until Discontinued, Historical Med    atorvastatin (LIPITOR) 80 MG tablet Take 1 tablet by mouth daily., Starting 03/12/2015, Until Discontinued, Historical Med    clopidogrel (PLAVIX) 75 MG tablet Take 1 tablet by mouth daily., Until Discontinued, Historical Med    diphenoxylate-atropine (  LOMOTIL) 2.5-0.025 MG tablet Take 1 tablet by mouth 4 (four) times daily as needed for diarrhea or loose stools., Starting 05/27/2015, Until Wed 05/26/16, Print    furosemide (LASIX) 40 MG tablet Take 1 tablet by mouth daily., Until Discontinued, Historical Med    insulin aspart (NOVOLOG FLEXPEN) 100 UNIT/ML FlexPen Inject 30 Units into the skin 3 (three) times daily. , Starting 03/18/2015, Until  Discontinued, Historical Med    insulin glargine (LANTUS) 100 UNIT/ML injection Inject 0.8 mLs (80 Units total) into the skin at bedtime., Starting 08/23/2015, Until Discontinued, Normal    lamoTRIgine (LAMICTAL) 100 MG tablet Take 100 mg by mouth 2 (two) times daily., Until Discontinued, Historical Med    levofloxacin (LEVAQUIN) 500 MG tablet Take 1 tablet (500 mg total) by mouth daily., Starting 08/23/2015, Until Discontinued, Normal    losartan (COZAAR) 100 MG tablet Take 1 tablet by mouth daily., Starting 04/28/2014, Until Discontinued, Historical Med    metoprolol succinate (TOPROL-XL) 25 MG 24 hr tablet Take 1 tablet by mouth daily., Until Discontinued, Historical Med    nitroGLYCERIN (NITROSTAT) 0.4 MG SL tablet Place 1 tablet under the tongue every 5 (five) minutes as needed for chest pain. , Until Discontinued, Historical Med    omeprazole (PRILOSEC) 20 MG capsule Take 1 capsule by mouth daily., Starting 05/20/2015, Until Discontinued, Historical Med    polyethylene glycol (MIRALAX / GLYCOLAX) packet Take 17 g by mouth daily., Starting 08/23/2015, Until Discontinued, Normal    potassium chloride (K-DUR) 10 MEQ tablet Take 10 mEq by mouth 2 (two) times daily., Until Discontinued, Historical Med    guaiFENesin-codeine 100-10 MG/5ML syrup 5-10 ml po qhs prn, Print        DATA REVIEW:   CBC  Recent Labs Lab 10/18/15 2011  WBC 6.9  HGB 14.4  HCT 42.2  PLT 199    Chemistries   Recent Labs Lab 10/18/15 2011  NA 135  K 3.1*  CL 106  CO2 24  GLUCOSE 92  BUN 7  CREATININE 0.64  CALCIUM 8.6*  AST 19  ALT 23  ALKPHOS 160*  BILITOT 0.5    Cardiac Enzymes  Recent Labs Lab 10/18/15 2011  TROPONINI <0.03    Microbiology Results  Results for orders placed or performed during the hospital encounter of 10/18/15  Urine culture     Status: None   Collection Time: 10/19/15  2:35 AM  Result Value Ref Range Status   Specimen Description URINE, RANDOM  Final    Special Requests NONE  Final   Culture MULTIPLE SPECIES PRESENT, SUGGEST RECOLLECTION  Final   Report Status 10/20/2015 FINAL  Final    RADIOLOGY:  No results found.    Management plans discussed with the patient, family and they are in agreement.  CODE STATUS:  Code Status History    Date Active Date Inactive Code Status Order ID Comments User Context   10/19/2015  1:03 AM 10/20/2015 11:02 AM Full Code [id number]  Harrie Foreman, MD Inpatient   08/21/2015  7:12 PM 08/23/2015  4:57 PM Full Code [id number]  Gladstone Lighter, MD Inpatient   02/19/2015  4:27 PM 02/20/2015  8:54 PM Full Code [id number]  Henreitta Leber, MD Inpatient      TOTAL TIME TAKING CARE OF THIS PATIENT: 30 minutes.    Henreitta Leber M.D on 10/22/2015 at 4:51 PM  Between 7am to 6pm - Pager - [phone]  After 6pm go to [email or web address] - password Child psychotherapist [facility]  Office  [phone]  CC: Primary care physician; [name], MD

## 2015-10-23 DIAGNOSIS — G3184 Mild cognitive impairment, so stated: Secondary | ICD-10-CM | POA: Diagnosis not present

## 2015-10-23 DIAGNOSIS — F5104 Psychophysiologic insomnia: Secondary | ICD-10-CM | POA: Diagnosis not present

## 2015-10-23 DIAGNOSIS — R410 Disorientation, unspecified: Secondary | ICD-10-CM | POA: Diagnosis not present

## 2015-10-24 DIAGNOSIS — F5104 Psychophysiologic insomnia: Secondary | ICD-10-CM | POA: Diagnosis not present

## 2015-10-24 DIAGNOSIS — G3184 Mild cognitive impairment, so stated: Secondary | ICD-10-CM | POA: Diagnosis not present

## 2015-10-24 DIAGNOSIS — R410 Disorientation, unspecified: Secondary | ICD-10-CM | POA: Diagnosis not present

## 2015-10-24 NOTE — ED Provider Notes (Signed)
Time Seen: Approximately 2050  I have reviewed the triage notes  Chief Complaint: Chest Pain; Altered Mental Status; Neck Pain; and Shortness of Breath   History of Present Illness: Maurice Patterson is a 60 y.o. male who has a history of multiple medical problems who apparently had an episode this afternoon where he had chest pain, confusion, shortness of breath, right-sided neck pain. Patient states that he's had chest pain now on and off for 2 years. His wife states that he seems to be in and out of awareness and seems to have episodes where he stares off into the distance. She states has occurred occasionally when his sugar has been low. Blood sugar on arrival was in the which his spouse says it is low for him. He was given an amp of D50 and had some slight improvement though still has episodes where he stares off into the distance. This all seemed to come about suddenly as he was at a birthday party. He has a significant history of diabetes previous cerebrovascular accident etc. Patient's able to answer most questions appropriately though occasionally is slow to respond he denies any focal weakness at this juncture.   Past Medical History  Diagnosis Date  . Seizures ([place])     staring spells  . Memory loss   . Arthritis   . Stroke ([place])   . Hypertension   . Diabetes mellitus without complication ([place])   . Malignant intraductal papillary mucinous tumor of pancreas ([place])   . Hernia of abdominal cavity   . Degenerative lumbar disc   . Acute MI ([place])   . TIA (transient ischemic attack)     Patient Active Problem List   Diagnosis Date Noted  . Confusion 10/18/2015  . Constipation 08/23/2015  . Uncontrolled type 2 diabetes mellitus with hyperglycemia, with long-term current use of insulin ([place]) 08/23/2015  . Essential hypertension 08/23/2015  . Right lower lobe pneumonia 08/23/2015  . Abdominal pain 08/21/2015  . Altered mental status 02/19/2015    Past Surgical History   Procedure Laterality Date  . Left arm metal plate    . Stent times 2  2013    Cardiac  . Left shoulder surgery    . Ercp N/A 09/12/2015    Procedure: ENDOSCOPIC RETROGRADE CHOLANGIOPANCREATOGRAPHY (ERCP);  Surgeon: Hulen Luster, MD;  Location: [facility] ENDOSCOPY;  Service: Gastroenterology;  Laterality: N/A;    Past Surgical History  Procedure Laterality Date  . Left arm metal plate    . Stent times 2  2013    Cardiac  . Left shoulder surgery    . Ercp N/A 09/12/2015    Procedure: ENDOSCOPIC RETROGRADE CHOLANGIOPANCREATOGRAPHY (ERCP);  Surgeon: Hulen Luster, MD;  Location: [facility] ENDOSCOPY;  Service: Gastroenterology;  Laterality: N/A;    Current Outpatient Rx  Name  Route  Sig  Dispense  Refill  . Acetaminophen 500 MG coapsule   Oral   Take 500 mg by mouth 2 (two) times daily.         Marland Kitchen aspirin 81 MG tablet   Oral   Take 81 mg by mouth daily.         Marland Kitchen atorvastatin (LIPITOR) 80 MG tablet   Oral   Take 1 tablet by mouth daily.      0   . clopidogrel (PLAVIX) 75 MG tablet   Oral   Take 1 tablet by mouth daily.         . diphenoxylate-atropine (LOMOTIL) 2.5-0.025 MG tablet  Oral   Take 1 tablet by mouth 4 (four) times daily as needed for diarrhea or loose stools. Patient taking differently: Take 1 tablet by mouth 4 (four) times daily as needed for diarrhea or loose stools. Wife reports takes 6 per day   30 tablet   1   . furosemide (LASIX) 40 MG tablet   Oral   Take 1 tablet by mouth daily.         . insulin aspart (NOVOLOG FLEXPEN) 100 UNIT/ML FlexPen   Subcutaneous   Inject 30 Units into the skin 3 (three) times daily.          . insulin glargine (LANTUS) 100 UNIT/ML injection   Subcutaneous   Inject 0.8 mLs (80 Units total) into the skin at bedtime.   10 mL   11   . lamoTRIgine (LAMICTAL) 100 MG tablet   Oral   Take 100 mg by mouth 2 (two) times daily.         Marland Kitchen levofloxacin (LEVAQUIN) 500 MG tablet   Oral   Take 1 tablet (500 mg total) by mouth  daily.   60 tablet   6   . losartan (COZAAR) 100 MG tablet   Oral   Take 1 tablet by mouth daily.         . metoprolol succinate (TOPROL-XL) 25 MG 24 hr tablet   Oral   Take 1 tablet by mouth daily.         . nitroGLYCERIN (NITROSTAT) 0.4 MG SL tablet   Sublingual   Place 1 tablet under the tongue every 5 (five) minutes as needed for chest pain.          Marland Kitchen omeprazole (PRILOSEC) 20 MG capsule   Oral   Take 1 capsule by mouth daily.      11   . polyethylene glycol (MIRALAX / GLYCOLAX) packet   Oral   Take 17 g by mouth daily. Patient taking differently: Take 17 g by mouth daily as needed for mild constipation.    14 each   0   . potassium chloride (K-DUR) 10 MEQ tablet   Oral   Take 10 mEq by mouth 2 (two) times daily.         Marland Kitchen guaiFENesin-codeine 100-10 MG/5ML syrup      5-10 ml po qhs prn Patient not taking: Reported on 08/21/2015   120 mL   0     Allergies:  Hydrocodone; Morphine; Ambien; Brilinta; Levetiracetam; Lorazepam; Trazodone; and Penicillins  Family History: Family History  Problem Relation Age of Onset  . Diabetes Mother   . Hypertension Mother   . Alzheimer's disease Father     Social History: Social History  Substance Use Topics  . Smoking status: Former Research scientist (life sciences)  . Smokeless tobacco: Never Used     Comment: used to smoke 2PD for 40 yrs, quit about 4 years ago  . Alcohol Use: No     Comment: occasional     Review of Systems:   10 point review of systems was performed and was otherwise negative:  Constitutional: No fever Eyes: No visual disturbances ENT: No sore throat, ear pain Cardiac: No chest pain Respiratory: No shortness of breath, wheezing, or stridor Abdomen: No abdominal pain, no vomiting, No diarrhea Endocrine: No weight loss, No night sweats Extremities: No peripheral edema, cyanosis Skin: No rashes, easy bruising Neurologic: No focal weakness, trouble with speech or swollowing Urologic: No dysuria, Hematuria,  or urinary frequency   Physical Exam:  ED  Triage Vitals  Enc Vitals Group     BP 10/18/15 2016 167/84 mmHg     Pulse Rate 10/18/15 2016 101     Resp 10/18/15 2016 18     Temp 10/18/15 2016 97 F (36.1 C)     Temp Source 10/18/15 2016 Axillary     SpO2 10/18/15 2016 99 %     Weight 10/18/15 2016 165 lb (74.844 kg)     Height 10/18/15 2016 5\' 6"  (1.676 m)     Head Cir --      Peak Flow --      Pain Score 10/19/15 1141 Asleep     Pain Loc --      Pain Edu? --      Excl. in [place]? --     General: Awake , Alert , and Oriented times 3; GCS 15 Head: Normal cephalic , atraumatic Eyes: Pupils equal , round, reactive to light Nose/Throat: No nasal drainage, patent upper airway without erythema or exudate.  Neck: Supple, Full range of motion, No anterior adenopathy or palpable thyroid masses Lungs: Clear to ascultation without wheezes , rhonchi, or rales Heart: Regular rate, regular rhythm without murmurs , gallops , or rubs Abdomen: Soft, non tender without rebound, guarding , or rigidity; bowel sounds positive and symmetric in all 4 quadrants. No organomegaly .        Extremities: 2 plus symmetric pulses. No edema, clubbing or cyanosis Neurologic: normal ambulation, Motor symmetric without deficits, sensory intact Skin: warm, dry, no rashes   ED ECG REPORT I, Daymon Larsen, the attending physician, personally viewed and interpreted this ECG.  Date: 10-18-2015 EKG Time: 2011 Rate: 100 Rhythm: normal sinus rhythm QRS Axis: normal Intervals: normal ST/T Wave abnormalities: normal Conduction Disturbances: none Narrative Interpretation: unremarkable Minimal criteria for left ventricular hypertrophy  Labs:   All laboratory work was reviewed including any pertinent negatives or positives listed below:  Labs Reviewed  PROTIME-INR - Abnormal; Notable for the following:    Prothrombin Time 15.1 (*)    All other components within normal limits  COMPREHENSIVE METABOLIC PANEL -  Abnormal; Notable for the following:    Potassium 3.1 (*)    Calcium 8.6 (*)    Alkaline Phosphatase 160 (*)    All other components within normal limits  GLUCOSE, CAPILLARY - Abnormal; Notable for the following:    Glucose-Capillary 152 (*)    All other components within normal limits  HEMOGLOBIN A1C - Abnormal; Notable for the following:    Hgb A1c MFr Bld 10.1 (*)    All other components within normal limits  GLUCOSE, CAPILLARY - Abnormal; Notable for the following:    Glucose-Capillary 159 (*)    All other components within normal limits  URINALYSIS COMPLETEWITH MICROSCOPIC ([facility] ONLY) - Abnormal; Notable for the following:    Color, Urine STRAW (*)    APPearance CLEAR (*)    Glucose, UA 50 (*)    All other components within normal limits  GLUCOSE, CAPILLARY - Abnormal; Notable for the following:    Glucose-Capillary 246 (*)    All other components within normal limits  GLUCOSE, CAPILLARY - Abnormal; Notable for the following:    Glucose-Capillary 224 (*)    All other components within normal limits  GLUCOSE, CAPILLARY - Abnormal; Notable for the following:    Glucose-Capillary 333 (*)    All other components within normal limits  GLUCOSE, CAPILLARY - Abnormal; Notable for the following:    Glucose-Capillary 186 (*)  All other components within normal limits  URINE CULTURE  APTT  CBC  DIFFERENTIAL  TROPONIN I  GLUCOSE, CAPILLARY  AMMONIA  TSH  CBG MONITORING, ED   reviewed the patient's laboratory work shows an elevated blood sugar otherwise laboratory tests appear to be within normal limits   Radiology:  EXAM: CT NECK WITH CONTRAST  TECHNIQUE: Multidetector CT imaging of the neck was performed using the standard protocol following the bolus administration of intravenous contrast.  CONTRAST: 81mL OMNIPAQUE IOHEXOL 300 MG/ML SOLN  COMPARISON: CT head October 18, 2015  FINDINGS: Pharynx and larynx: Nonacute. Punctate tonsilliths.  Otherwise negative.  Salivary glands: Normal.  Thyroid: Normal.  Lymph nodes: No lymphadenopathy by CT size criteria. Subcentimeter RIGHT level IIa reniform lymph node is likely reactive.  Vascular: Mild calcific atherosclerosis of the aortic arch and carotid bifurcations.  Limited intracranial: Normal.  Visualized orbits: Normal. Punctate RIGHT periorbital dermal calcifications.  Mastoids and visualized paranasal sinuses: Moderate RIGHT sphenoid mucosal thickening, no paranasal sinus air-fluid levels. The imaged mastoid air cells are well aerated.  Skeleton: No destructive bony lesions. Very mild degenerative changes cervical spine. Patient is edentulous.  Upper chest: Mild centrilobular emphysema. Subcentimeter mediastinal lymph nodes may be reactive.  IMPRESSION: Negative CT neck.   Electronically Signed By: Elon Alas M.D. On: 10/18/2015 22:21          DG Chest 2 View (Final result) Result time: 10/18/15 21:46:09   Final result by Rad Results In Interface (10/18/15 21:46:09)   Narrative:   CLINICAL DATA: Shortness of Breath  EXAM: CHEST 2 VIEW  COMPARISON: 08/21/2015  FINDINGS: Cardiac shadow is within normal limits. The lungs are well aerated bilaterally. Old healed rib fractures are noted on the right posteriorly. No acute abnormality is seen.  IMPRESSION: No active cardiopulmonary disease.   Electronically Signed By: Inez Catalina M.D. On: 10/18/2015 21:46          CT Head Wo Contrast (Final result) Result time: 10/18/15 20:31:14   Final result by Rad Results In Interface (10/18/15 20:31:14)   Narrative:   CLINICAL DATA: Patient with progressive confusion.  EXAM: CT HEAD WITHOUT CONTRAST  TECHNIQUE: Contiguous axial images were obtained from the base of the skull through the vertex without intravenous contrast.  COMPARISON: Brain CT 08/21/2015.  FINDINGS: Ventricles and sulci are appropriate for  patient's age. No evidence for acute cortically based infarct, intracranial hemorrhage, mass lesion or mass-effect. Orbits are unremarkable. Mucosal thickening involving the sphenoid sinus. Remainder of the paranasal sinuses are unremarkable. Mastoid air cells are well aerated. Calvarium is intact.  IMPRESSION: No acute intracranial process.   Electronically Signed By: Lovey Newcomer M.D.        I personally reviewed the radiologic studies    ED Course: * The patient's stay here showed still some intermittent episodes where he seemed to stare off into the distance though these seem to be brief in nature. I'm not sure of the nature of these episodes of confusion and disorientation. His blood sugars have remained adequate here in emergency department. Currently not sure if these are absence Seizures Versus transient ischemic attack or some metabolic cause at this point. I felt uncomfortable at this juncture discharge and the patient without adequate explanation of his symptoms. Patient had a CT of the neck and with concern of his right-sided neck pain and the possibility of a carotid dissection.   Assessment:  Altered mental status Diabetes      Plan: * Inpatient management**  Daymon Larsen, MD 10/24/15 [phone]

## 2015-10-25 DIAGNOSIS — R410 Disorientation, unspecified: Secondary | ICD-10-CM | POA: Diagnosis not present

## 2015-10-25 DIAGNOSIS — B029 Zoster without complications: Secondary | ICD-10-CM | POA: Diagnosis not present

## 2015-10-25 DIAGNOSIS — E11649 Type 2 diabetes mellitus with hypoglycemia without coma: Secondary | ICD-10-CM | POA: Diagnosis not present

## 2015-10-25 DIAGNOSIS — G92 Toxic encephalopathy: Secondary | ICD-10-CM | POA: Diagnosis not present

## 2015-10-25 DIAGNOSIS — Z794 Long term (current) use of insulin: Secondary | ICD-10-CM | POA: Diagnosis not present

## 2015-10-25 DIAGNOSIS — I2510 Atherosclerotic heart disease of native coronary artery without angina pectoris: Secondary | ICD-10-CM | POA: Diagnosis not present

## 2015-10-25 DIAGNOSIS — E876 Hypokalemia: Secondary | ICD-10-CM | POA: Diagnosis not present

## 2015-10-26 DIAGNOSIS — I2510 Atherosclerotic heart disease of native coronary artery without angina pectoris: Secondary | ICD-10-CM | POA: Diagnosis not present

## 2015-10-26 DIAGNOSIS — E11649 Type 2 diabetes mellitus with hypoglycemia without coma: Secondary | ICD-10-CM | POA: Diagnosis not present

## 2015-10-26 DIAGNOSIS — E876 Hypokalemia: Secondary | ICD-10-CM | POA: Diagnosis not present

## 2015-10-26 DIAGNOSIS — G92 Toxic encephalopathy: Secondary | ICD-10-CM | POA: Diagnosis not present

## 2015-10-26 DIAGNOSIS — Z794 Long term (current) use of insulin: Secondary | ICD-10-CM | POA: Diagnosis not present

## 2015-10-26 DIAGNOSIS — B029 Zoster without complications: Secondary | ICD-10-CM | POA: Diagnosis not present

## 2015-10-27 DIAGNOSIS — G92 Toxic encephalopathy: Secondary | ICD-10-CM | POA: Diagnosis not present

## 2015-10-27 DIAGNOSIS — B029 Zoster without complications: Secondary | ICD-10-CM | POA: Diagnosis not present

## 2015-10-27 DIAGNOSIS — I2510 Atherosclerotic heart disease of native coronary artery without angina pectoris: Secondary | ICD-10-CM | POA: Diagnosis not present

## 2015-10-27 DIAGNOSIS — E11649 Type 2 diabetes mellitus with hypoglycemia without coma: Secondary | ICD-10-CM | POA: Diagnosis not present

## 2015-10-27 DIAGNOSIS — R410 Disorientation, unspecified: Secondary | ICD-10-CM | POA: Diagnosis not present

## 2015-10-27 DIAGNOSIS — E876 Hypokalemia: Secondary | ICD-10-CM | POA: Diagnosis not present

## 2015-10-27 DIAGNOSIS — Z794 Long term (current) use of insulin: Secondary | ICD-10-CM | POA: Diagnosis not present

## 2015-10-27 DIAGNOSIS — N390 Urinary tract infection, site not specified: Secondary | ICD-10-CM | POA: Diagnosis not present

## 2015-10-28 DIAGNOSIS — Z794 Long term (current) use of insulin: Secondary | ICD-10-CM | POA: Diagnosis not present

## 2015-10-28 DIAGNOSIS — E876 Hypokalemia: Secondary | ICD-10-CM | POA: Diagnosis not present

## 2015-10-28 DIAGNOSIS — I2510 Atherosclerotic heart disease of native coronary artery without angina pectoris: Secondary | ICD-10-CM | POA: Diagnosis not present

## 2015-10-28 DIAGNOSIS — R410 Disorientation, unspecified: Secondary | ICD-10-CM | POA: Diagnosis not present

## 2015-10-28 DIAGNOSIS — G92 Toxic encephalopathy: Secondary | ICD-10-CM | POA: Diagnosis not present

## 2015-10-28 DIAGNOSIS — B029 Zoster without complications: Secondary | ICD-10-CM | POA: Diagnosis not present

## 2015-10-28 DIAGNOSIS — N390 Urinary tract infection, site not specified: Secondary | ICD-10-CM | POA: Diagnosis not present

## 2015-10-28 DIAGNOSIS — E11649 Type 2 diabetes mellitus with hypoglycemia without coma: Secondary | ICD-10-CM | POA: Diagnosis not present

## 2015-10-29 DIAGNOSIS — G92 Toxic encephalopathy: Secondary | ICD-10-CM | POA: Diagnosis not present

## 2015-10-29 DIAGNOSIS — E11649 Type 2 diabetes mellitus with hypoglycemia without coma: Secondary | ICD-10-CM | POA: Diagnosis not present

## 2015-10-29 DIAGNOSIS — I2510 Atherosclerotic heart disease of native coronary artery without angina pectoris: Secondary | ICD-10-CM | POA: Diagnosis not present

## 2015-10-29 DIAGNOSIS — N390 Urinary tract infection, site not specified: Secondary | ICD-10-CM | POA: Diagnosis not present

## 2015-10-29 DIAGNOSIS — Z794 Long term (current) use of insulin: Secondary | ICD-10-CM | POA: Diagnosis not present

## 2015-10-29 DIAGNOSIS — B029 Zoster without complications: Secondary | ICD-10-CM | POA: Diagnosis not present

## 2015-10-29 DIAGNOSIS — B952 Enterococcus as the cause of diseases classified elsewhere: Secondary | ICD-10-CM | POA: Diagnosis not present

## 2015-10-30 DIAGNOSIS — I2510 Atherosclerotic heart disease of native coronary artery without angina pectoris: Secondary | ICD-10-CM | POA: Diagnosis not present

## 2015-10-30 DIAGNOSIS — B029 Zoster without complications: Secondary | ICD-10-CM | POA: Diagnosis not present

## 2015-10-30 DIAGNOSIS — G9340 Encephalopathy, unspecified: Secondary | ICD-10-CM | POA: Diagnosis not present

## 2015-10-30 DIAGNOSIS — N390 Urinary tract infection, site not specified: Secondary | ICD-10-CM | POA: Diagnosis not present

## 2015-10-30 DIAGNOSIS — B952 Enterococcus as the cause of diseases classified elsewhere: Secondary | ICD-10-CM | POA: Diagnosis not present

## 2015-10-30 DIAGNOSIS — R410 Disorientation, unspecified: Secondary | ICD-10-CM | POA: Diagnosis not present

## 2015-10-30 DIAGNOSIS — Z794 Long term (current) use of insulin: Secondary | ICD-10-CM | POA: Diagnosis not present

## 2015-10-30 DIAGNOSIS — E11649 Type 2 diabetes mellitus with hypoglycemia without coma: Secondary | ICD-10-CM | POA: Diagnosis not present

## 2015-10-31 DIAGNOSIS — R410 Disorientation, unspecified: Secondary | ICD-10-CM | POA: Diagnosis not present

## 2015-10-31 DIAGNOSIS — E11649 Type 2 diabetes mellitus with hypoglycemia without coma: Secondary | ICD-10-CM | POA: Diagnosis not present

## 2015-10-31 DIAGNOSIS — N390 Urinary tract infection, site not specified: Secondary | ICD-10-CM | POA: Diagnosis not present

## 2015-10-31 DIAGNOSIS — Z794 Long term (current) use of insulin: Secondary | ICD-10-CM | POA: Diagnosis not present

## 2015-10-31 DIAGNOSIS — B952 Enterococcus as the cause of diseases classified elsewhere: Secondary | ICD-10-CM | POA: Diagnosis not present

## 2015-10-31 DIAGNOSIS — G9340 Encephalopathy, unspecified: Secondary | ICD-10-CM | POA: Diagnosis not present

## 2015-10-31 DIAGNOSIS — I2510 Atherosclerotic heart disease of native coronary artery without angina pectoris: Secondary | ICD-10-CM | POA: Diagnosis not present

## 2015-10-31 DIAGNOSIS — R079 Chest pain, unspecified: Secondary | ICD-10-CM | POA: Diagnosis not present

## 2015-10-31 DIAGNOSIS — B029 Zoster without complications: Secondary | ICD-10-CM | POA: Diagnosis not present

## 2015-11-01 DIAGNOSIS — N390 Urinary tract infection, site not specified: Secondary | ICD-10-CM | POA: Diagnosis not present

## 2015-11-01 DIAGNOSIS — D490 Neoplasm of unspecified behavior of digestive system: Secondary | ICD-10-CM | POA: Diagnosis not present

## 2015-11-01 DIAGNOSIS — E11649 Type 2 diabetes mellitus with hypoglycemia without coma: Secondary | ICD-10-CM | POA: Diagnosis not present

## 2015-11-01 DIAGNOSIS — R404 Transient alteration of awareness: Secondary | ICD-10-CM | POA: Diagnosis not present

## 2015-11-01 DIAGNOSIS — G92 Toxic encephalopathy: Secondary | ICD-10-CM | POA: Diagnosis not present

## 2015-11-01 DIAGNOSIS — Z8669 Personal history of other diseases of the nervous system and sense organs: Secondary | ICD-10-CM | POA: Diagnosis not present

## 2015-11-01 DIAGNOSIS — R410 Disorientation, unspecified: Secondary | ICD-10-CM | POA: Diagnosis not present

## 2015-11-01 DIAGNOSIS — Z794 Long term (current) use of insulin: Secondary | ICD-10-CM | POA: Diagnosis not present

## 2015-11-02 DIAGNOSIS — Z8669 Personal history of other diseases of the nervous system and sense organs: Secondary | ICD-10-CM | POA: Diagnosis not present

## 2015-11-02 DIAGNOSIS — G92 Toxic encephalopathy: Secondary | ICD-10-CM | POA: Diagnosis not present

## 2015-11-02 DIAGNOSIS — E11649 Type 2 diabetes mellitus with hypoglycemia without coma: Secondary | ICD-10-CM | POA: Diagnosis not present

## 2015-11-02 DIAGNOSIS — N390 Urinary tract infection, site not specified: Secondary | ICD-10-CM | POA: Diagnosis not present

## 2015-11-02 DIAGNOSIS — Z794 Long term (current) use of insulin: Secondary | ICD-10-CM | POA: Diagnosis not present

## 2015-11-02 DIAGNOSIS — D490 Neoplasm of unspecified behavior of digestive system: Secondary | ICD-10-CM | POA: Diagnosis not present

## 2015-11-02 DIAGNOSIS — R410 Disorientation, unspecified: Secondary | ICD-10-CM | POA: Diagnosis not present

## 2015-11-03 DIAGNOSIS — Z8669 Personal history of other diseases of the nervous system and sense organs: Secondary | ICD-10-CM | POA: Diagnosis not present

## 2015-11-03 DIAGNOSIS — G92 Toxic encephalopathy: Secondary | ICD-10-CM | POA: Diagnosis not present

## 2015-11-03 DIAGNOSIS — I10 Essential (primary) hypertension: Secondary | ICD-10-CM | POA: Diagnosis not present

## 2015-11-03 DIAGNOSIS — R410 Disorientation, unspecified: Secondary | ICD-10-CM | POA: Diagnosis not present

## 2015-11-03 DIAGNOSIS — R4182 Altered mental status, unspecified: Secondary | ICD-10-CM | POA: Diagnosis not present

## 2015-11-03 DIAGNOSIS — D490 Neoplasm of unspecified behavior of digestive system: Secondary | ICD-10-CM | POA: Diagnosis not present

## 2015-11-03 DIAGNOSIS — R079 Chest pain, unspecified: Secondary | ICD-10-CM | POA: Diagnosis not present

## 2015-11-03 DIAGNOSIS — Z794 Long term (current) use of insulin: Secondary | ICD-10-CM | POA: Diagnosis not present

## 2015-11-03 DIAGNOSIS — E440 Moderate protein-calorie malnutrition: Secondary | ICD-10-CM | POA: Diagnosis not present

## 2015-11-03 DIAGNOSIS — E11649 Type 2 diabetes mellitus with hypoglycemia without coma: Secondary | ICD-10-CM | POA: Diagnosis not present

## 2015-11-03 DIAGNOSIS — M519 Unspecified thoracic, thoracolumbar and lumbosacral intervertebral disc disorder: Secondary | ICD-10-CM | POA: Diagnosis not present

## 2015-11-03 DIAGNOSIS — N390 Urinary tract infection, site not specified: Secondary | ICD-10-CM | POA: Diagnosis not present

## 2015-11-03 DIAGNOSIS — B029 Zoster without complications: Secondary | ICD-10-CM | POA: Diagnosis not present

## 2015-11-04 DIAGNOSIS — E440 Moderate protein-calorie malnutrition: Secondary | ICD-10-CM | POA: Diagnosis not present

## 2015-11-04 DIAGNOSIS — D490 Neoplasm of unspecified behavior of digestive system: Secondary | ICD-10-CM | POA: Diagnosis not present

## 2015-11-04 DIAGNOSIS — M519 Unspecified thoracic, thoracolumbar and lumbosacral intervertebral disc disorder: Secondary | ICD-10-CM | POA: Diagnosis not present

## 2015-11-04 DIAGNOSIS — R410 Disorientation, unspecified: Secondary | ICD-10-CM | POA: Diagnosis not present

## 2015-11-04 DIAGNOSIS — Z794 Long term (current) use of insulin: Secondary | ICD-10-CM | POA: Diagnosis not present

## 2015-11-04 DIAGNOSIS — E11649 Type 2 diabetes mellitus with hypoglycemia without coma: Secondary | ICD-10-CM | POA: Diagnosis not present

## 2015-11-04 DIAGNOSIS — R079 Chest pain, unspecified: Secondary | ICD-10-CM | POA: Diagnosis not present

## 2015-11-04 DIAGNOSIS — I10 Essential (primary) hypertension: Secondary | ICD-10-CM | POA: Diagnosis not present

## 2015-11-04 DIAGNOSIS — B029 Zoster without complications: Secondary | ICD-10-CM | POA: Diagnosis not present

## 2015-11-04 DIAGNOSIS — N390 Urinary tract infection, site not specified: Secondary | ICD-10-CM | POA: Diagnosis not present

## 2015-11-04 DIAGNOSIS — R4182 Altered mental status, unspecified: Secondary | ICD-10-CM | POA: Diagnosis not present

## 2015-11-04 DIAGNOSIS — Z8669 Personal history of other diseases of the nervous system and sense organs: Secondary | ICD-10-CM | POA: Diagnosis not present

## 2015-11-04 DIAGNOSIS — G92 Toxic encephalopathy: Secondary | ICD-10-CM | POA: Diagnosis not present

## 2015-11-05 DIAGNOSIS — M519 Unspecified thoracic, thoracolumbar and lumbosacral intervertebral disc disorder: Secondary | ICD-10-CM | POA: Diagnosis not present

## 2015-11-05 DIAGNOSIS — R410 Disorientation, unspecified: Secondary | ICD-10-CM | POA: Diagnosis not present

## 2015-11-05 DIAGNOSIS — R4182 Altered mental status, unspecified: Secondary | ICD-10-CM | POA: Diagnosis not present

## 2015-11-05 DIAGNOSIS — B029 Zoster without complications: Secondary | ICD-10-CM | POA: Diagnosis not present

## 2015-11-05 DIAGNOSIS — I10 Essential (primary) hypertension: Secondary | ICD-10-CM | POA: Diagnosis not present

## 2015-11-05 DIAGNOSIS — Z8669 Personal history of other diseases of the nervous system and sense organs: Secondary | ICD-10-CM | POA: Diagnosis not present

## 2015-11-05 DIAGNOSIS — G92 Toxic encephalopathy: Secondary | ICD-10-CM | POA: Diagnosis not present

## 2015-11-05 DIAGNOSIS — Z794 Long term (current) use of insulin: Secondary | ICD-10-CM | POA: Diagnosis not present

## 2015-11-05 DIAGNOSIS — R079 Chest pain, unspecified: Secondary | ICD-10-CM | POA: Diagnosis not present

## 2015-11-05 DIAGNOSIS — N390 Urinary tract infection, site not specified: Secondary | ICD-10-CM | POA: Diagnosis not present

## 2015-11-05 DIAGNOSIS — E440 Moderate protein-calorie malnutrition: Secondary | ICD-10-CM | POA: Diagnosis not present

## 2015-11-05 DIAGNOSIS — E11649 Type 2 diabetes mellitus with hypoglycemia without coma: Secondary | ICD-10-CM | POA: Diagnosis not present

## 2015-11-06 DIAGNOSIS — R410 Disorientation, unspecified: Secondary | ICD-10-CM | POA: Diagnosis not present

## 2015-11-06 DIAGNOSIS — E440 Moderate protein-calorie malnutrition: Secondary | ICD-10-CM | POA: Diagnosis not present

## 2015-11-06 DIAGNOSIS — Z8669 Personal history of other diseases of the nervous system and sense organs: Secondary | ICD-10-CM | POA: Diagnosis not present

## 2015-11-06 DIAGNOSIS — Z794 Long term (current) use of insulin: Secondary | ICD-10-CM | POA: Diagnosis not present

## 2015-11-06 DIAGNOSIS — M519 Unspecified thoracic, thoracolumbar and lumbosacral intervertebral disc disorder: Secondary | ICD-10-CM | POA: Diagnosis not present

## 2015-11-06 DIAGNOSIS — R079 Chest pain, unspecified: Secondary | ICD-10-CM | POA: Diagnosis not present

## 2015-11-06 DIAGNOSIS — G92 Toxic encephalopathy: Secondary | ICD-10-CM | POA: Diagnosis not present

## 2015-11-06 DIAGNOSIS — R4182 Altered mental status, unspecified: Secondary | ICD-10-CM | POA: Diagnosis not present

## 2015-11-06 DIAGNOSIS — E11649 Type 2 diabetes mellitus with hypoglycemia without coma: Secondary | ICD-10-CM | POA: Diagnosis not present

## 2015-11-06 DIAGNOSIS — N390 Urinary tract infection, site not specified: Secondary | ICD-10-CM | POA: Diagnosis not present

## 2015-11-06 DIAGNOSIS — B029 Zoster without complications: Secondary | ICD-10-CM | POA: Diagnosis not present

## 2015-11-06 DIAGNOSIS — I10 Essential (primary) hypertension: Secondary | ICD-10-CM | POA: Diagnosis not present

## 2015-11-07 DIAGNOSIS — R410 Disorientation, unspecified: Secondary | ICD-10-CM | POA: Diagnosis not present

## 2015-11-07 DIAGNOSIS — Z794 Long term (current) use of insulin: Secondary | ICD-10-CM | POA: Diagnosis not present

## 2015-11-07 DIAGNOSIS — B029 Zoster without complications: Secondary | ICD-10-CM | POA: Diagnosis not present

## 2015-11-07 DIAGNOSIS — R079 Chest pain, unspecified: Secondary | ICD-10-CM | POA: Diagnosis not present

## 2015-11-07 DIAGNOSIS — E440 Moderate protein-calorie malnutrition: Secondary | ICD-10-CM | POA: Diagnosis not present

## 2015-11-07 DIAGNOSIS — M519 Unspecified thoracic, thoracolumbar and lumbosacral intervertebral disc disorder: Secondary | ICD-10-CM | POA: Diagnosis not present

## 2015-11-07 DIAGNOSIS — I10 Essential (primary) hypertension: Secondary | ICD-10-CM | POA: Diagnosis not present

## 2015-11-07 DIAGNOSIS — Z8669 Personal history of other diseases of the nervous system and sense organs: Secondary | ICD-10-CM | POA: Diagnosis not present

## 2015-11-07 DIAGNOSIS — G92 Toxic encephalopathy: Secondary | ICD-10-CM | POA: Diagnosis not present

## 2015-11-07 DIAGNOSIS — E11649 Type 2 diabetes mellitus with hypoglycemia without coma: Secondary | ICD-10-CM | POA: Diagnosis not present

## 2015-11-07 DIAGNOSIS — N390 Urinary tract infection, site not specified: Secondary | ICD-10-CM | POA: Diagnosis not present

## 2015-11-07 DIAGNOSIS — G9340 Encephalopathy, unspecified: Secondary | ICD-10-CM | POA: Diagnosis not present

## 2015-11-07 DIAGNOSIS — R4182 Altered mental status, unspecified: Secondary | ICD-10-CM | POA: Diagnosis not present

## 2015-11-08 DIAGNOSIS — G92 Toxic encephalopathy: Secondary | ICD-10-CM | POA: Diagnosis not present

## 2015-11-08 DIAGNOSIS — B029 Zoster without complications: Secondary | ICD-10-CM | POA: Diagnosis not present

## 2015-11-08 DIAGNOSIS — R4182 Altered mental status, unspecified: Secondary | ICD-10-CM | POA: Diagnosis not present

## 2015-11-08 DIAGNOSIS — I10 Essential (primary) hypertension: Secondary | ICD-10-CM | POA: Diagnosis not present

## 2015-11-08 DIAGNOSIS — E440 Moderate protein-calorie malnutrition: Secondary | ICD-10-CM | POA: Diagnosis not present

## 2015-11-08 DIAGNOSIS — R410 Disorientation, unspecified: Secondary | ICD-10-CM | POA: Diagnosis not present

## 2015-11-08 DIAGNOSIS — R079 Chest pain, unspecified: Secondary | ICD-10-CM | POA: Diagnosis not present

## 2015-11-08 DIAGNOSIS — N390 Urinary tract infection, site not specified: Secondary | ICD-10-CM | POA: Diagnosis not present

## 2015-11-08 DIAGNOSIS — Z8669 Personal history of other diseases of the nervous system and sense organs: Secondary | ICD-10-CM | POA: Diagnosis not present

## 2015-11-08 DIAGNOSIS — M519 Unspecified thoracic, thoracolumbar and lumbosacral intervertebral disc disorder: Secondary | ICD-10-CM | POA: Diagnosis not present

## 2015-11-09 DIAGNOSIS — R410 Disorientation, unspecified: Secondary | ICD-10-CM | POA: Diagnosis not present

## 2015-11-10 DIAGNOSIS — R410 Disorientation, unspecified: Secondary | ICD-10-CM | POA: Diagnosis not present

## 2015-11-11 DIAGNOSIS — R410 Disorientation, unspecified: Secondary | ICD-10-CM | POA: Diagnosis not present

## 2015-11-12 DIAGNOSIS — E440 Moderate protein-calorie malnutrition: Secondary | ICD-10-CM | POA: Diagnosis not present

## 2015-11-12 DIAGNOSIS — I2510 Atherosclerotic heart disease of native coronary artery without angina pectoris: Secondary | ICD-10-CM | POA: Diagnosis not present

## 2015-11-12 DIAGNOSIS — Z8669 Personal history of other diseases of the nervous system and sense organs: Secondary | ICD-10-CM | POA: Diagnosis not present

## 2015-11-12 DIAGNOSIS — G9340 Encephalopathy, unspecified: Secondary | ICD-10-CM | POA: Diagnosis not present

## 2015-11-13 DIAGNOSIS — G9340 Encephalopathy, unspecified: Secondary | ICD-10-CM | POA: Diagnosis not present

## 2015-11-13 DIAGNOSIS — Z794 Long term (current) use of insulin: Secondary | ICD-10-CM | POA: Diagnosis not present

## 2015-11-13 DIAGNOSIS — E119 Type 2 diabetes mellitus without complications: Secondary | ICD-10-CM | POA: Diagnosis not present

## 2015-11-13 DIAGNOSIS — E440 Moderate protein-calorie malnutrition: Secondary | ICD-10-CM | POA: Diagnosis not present

## 2015-11-13 DIAGNOSIS — Z8669 Personal history of other diseases of the nervous system and sense organs: Secondary | ICD-10-CM | POA: Diagnosis not present

## 2015-11-13 DIAGNOSIS — I2510 Atherosclerotic heart disease of native coronary artery without angina pectoris: Secondary | ICD-10-CM | POA: Diagnosis not present

## 2015-11-14 ENCOUNTER — Ambulatory Visit: Admission: RE | Admit: 2015-11-14 | Payer: PPO | Source: Ambulatory Visit | Admitting: Gastroenterology

## 2015-11-14 ENCOUNTER — Encounter: Admission: RE | Payer: Self-pay | Source: Ambulatory Visit

## 2015-11-14 DIAGNOSIS — G9340 Encephalopathy, unspecified: Secondary | ICD-10-CM | POA: Diagnosis not present

## 2015-11-14 DIAGNOSIS — Z8669 Personal history of other diseases of the nervous system and sense organs: Secondary | ICD-10-CM | POA: Diagnosis not present

## 2015-11-14 DIAGNOSIS — I2510 Atherosclerotic heart disease of native coronary artery without angina pectoris: Secondary | ICD-10-CM | POA: Diagnosis not present

## 2015-11-14 DIAGNOSIS — E119 Type 2 diabetes mellitus without complications: Secondary | ICD-10-CM | POA: Diagnosis not present

## 2015-11-14 DIAGNOSIS — E440 Moderate protein-calorie malnutrition: Secondary | ICD-10-CM | POA: Diagnosis not present

## 2015-11-14 DIAGNOSIS — Z794 Long term (current) use of insulin: Secondary | ICD-10-CM | POA: Diagnosis not present

## 2015-11-14 SURGERY — ERCP, WITH INTERVENTION IF INDICATED
Anesthesia: General

## 2015-11-15 DIAGNOSIS — G92 Toxic encephalopathy: Secondary | ICD-10-CM | POA: Diagnosis not present

## 2015-11-15 DIAGNOSIS — D490 Neoplasm of unspecified behavior of digestive system: Secondary | ICD-10-CM | POA: Diagnosis not present

## 2015-11-15 DIAGNOSIS — R262 Difficulty in walking, not elsewhere classified: Secondary | ICD-10-CM | POA: Diagnosis not present

## 2015-11-15 DIAGNOSIS — I2510 Atherosclerotic heart disease of native coronary artery without angina pectoris: Secondary | ICD-10-CM | POA: Diagnosis not present

## 2015-11-15 DIAGNOSIS — I10 Essential (primary) hypertension: Secondary | ICD-10-CM | POA: Diagnosis not present

## 2015-11-15 DIAGNOSIS — Z794 Long term (current) use of insulin: Secondary | ICD-10-CM | POA: Diagnosis not present

## 2015-11-15 DIAGNOSIS — G9340 Encephalopathy, unspecified: Secondary | ICD-10-CM | POA: Diagnosis not present

## 2015-11-15 DIAGNOSIS — Z8669 Personal history of other diseases of the nervous system and sense organs: Secondary | ICD-10-CM | POA: Diagnosis not present

## 2015-11-15 DIAGNOSIS — R404 Transient alteration of awareness: Secondary | ICD-10-CM | POA: Diagnosis not present

## 2015-11-15 DIAGNOSIS — E440 Moderate protein-calorie malnutrition: Secondary | ICD-10-CM | POA: Diagnosis not present

## 2015-11-15 DIAGNOSIS — R410 Disorientation, unspecified: Secondary | ICD-10-CM | POA: Diagnosis not present

## 2015-11-15 DIAGNOSIS — E1165 Type 2 diabetes mellitus with hyperglycemia: Secondary | ICD-10-CM | POA: Diagnosis not present

## 2015-11-16 DIAGNOSIS — Z8669 Personal history of other diseases of the nervous system and sense organs: Secondary | ICD-10-CM | POA: Diagnosis not present

## 2015-11-16 DIAGNOSIS — E1165 Type 2 diabetes mellitus with hyperglycemia: Secondary | ICD-10-CM | POA: Diagnosis not present

## 2015-11-16 DIAGNOSIS — E440 Moderate protein-calorie malnutrition: Secondary | ICD-10-CM | POA: Diagnosis not present

## 2015-11-16 DIAGNOSIS — I10 Essential (primary) hypertension: Secondary | ICD-10-CM | POA: Diagnosis not present

## 2015-11-16 DIAGNOSIS — I2510 Atherosclerotic heart disease of native coronary artery without angina pectoris: Secondary | ICD-10-CM | POA: Diagnosis not present

## 2015-11-16 DIAGNOSIS — G9340 Encephalopathy, unspecified: Secondary | ICD-10-CM | POA: Diagnosis not present

## 2015-11-16 DIAGNOSIS — R404 Transient alteration of awareness: Secondary | ICD-10-CM | POA: Diagnosis not present

## 2015-11-16 DIAGNOSIS — R262 Difficulty in walking, not elsewhere classified: Secondary | ICD-10-CM | POA: Diagnosis not present

## 2015-11-16 DIAGNOSIS — D490 Neoplasm of unspecified behavior of digestive system: Secondary | ICD-10-CM | POA: Diagnosis not present

## 2015-11-16 DIAGNOSIS — R410 Disorientation, unspecified: Secondary | ICD-10-CM | POA: Diagnosis not present

## 2015-11-16 DIAGNOSIS — G92 Toxic encephalopathy: Secondary | ICD-10-CM | POA: Diagnosis not present

## 2015-11-16 DIAGNOSIS — B9689 Other specified bacterial agents as the cause of diseases classified elsewhere: Secondary | ICD-10-CM | POA: Diagnosis not present

## 2015-11-17 DIAGNOSIS — R262 Difficulty in walking, not elsewhere classified: Secondary | ICD-10-CM | POA: Diagnosis not present

## 2015-11-17 DIAGNOSIS — E1165 Type 2 diabetes mellitus with hyperglycemia: Secondary | ICD-10-CM | POA: Diagnosis not present

## 2015-11-17 DIAGNOSIS — G92 Toxic encephalopathy: Secondary | ICD-10-CM | POA: Diagnosis not present

## 2015-11-17 DIAGNOSIS — D490 Neoplasm of unspecified behavior of digestive system: Secondary | ICD-10-CM | POA: Diagnosis not present

## 2015-11-17 DIAGNOSIS — B9689 Other specified bacterial agents as the cause of diseases classified elsewhere: Secondary | ICD-10-CM | POA: Diagnosis not present

## 2015-11-17 DIAGNOSIS — Z8669 Personal history of other diseases of the nervous system and sense organs: Secondary | ICD-10-CM | POA: Diagnosis not present

## 2015-11-17 DIAGNOSIS — R404 Transient alteration of awareness: Secondary | ICD-10-CM | POA: Diagnosis not present

## 2015-11-17 DIAGNOSIS — R410 Disorientation, unspecified: Secondary | ICD-10-CM | POA: Diagnosis not present

## 2015-11-17 DIAGNOSIS — G9340 Encephalopathy, unspecified: Secondary | ICD-10-CM | POA: Diagnosis not present

## 2015-11-17 DIAGNOSIS — I10 Essential (primary) hypertension: Secondary | ICD-10-CM | POA: Diagnosis not present

## 2015-11-17 DIAGNOSIS — I2510 Atherosclerotic heart disease of native coronary artery without angina pectoris: Secondary | ICD-10-CM | POA: Diagnosis not present

## 2015-11-17 DIAGNOSIS — E440 Moderate protein-calorie malnutrition: Secondary | ICD-10-CM | POA: Diagnosis not present

## 2015-11-18 DIAGNOSIS — I10 Essential (primary) hypertension: Secondary | ICD-10-CM | POA: Diagnosis not present

## 2015-11-18 DIAGNOSIS — D490 Neoplasm of unspecified behavior of digestive system: Secondary | ICD-10-CM | POA: Diagnosis not present

## 2015-11-18 DIAGNOSIS — E440 Moderate protein-calorie malnutrition: Secondary | ICD-10-CM | POA: Diagnosis not present

## 2015-11-18 DIAGNOSIS — G9349 Other encephalopathy: Secondary | ICD-10-CM | POA: Diagnosis not present

## 2015-11-18 DIAGNOSIS — B9689 Other specified bacterial agents as the cause of diseases classified elsewhere: Secondary | ICD-10-CM | POA: Diagnosis not present

## 2015-11-18 DIAGNOSIS — R410 Disorientation, unspecified: Secondary | ICD-10-CM | POA: Diagnosis not present

## 2015-11-18 DIAGNOSIS — G92 Toxic encephalopathy: Secondary | ICD-10-CM | POA: Diagnosis not present

## 2015-11-18 DIAGNOSIS — G9340 Encephalopathy, unspecified: Secondary | ICD-10-CM | POA: Diagnosis not present

## 2015-11-18 DIAGNOSIS — Z8669 Personal history of other diseases of the nervous system and sense organs: Secondary | ICD-10-CM | POA: Diagnosis not present

## 2015-11-18 DIAGNOSIS — R262 Difficulty in walking, not elsewhere classified: Secondary | ICD-10-CM | POA: Diagnosis not present

## 2015-11-18 DIAGNOSIS — I2510 Atherosclerotic heart disease of native coronary artery without angina pectoris: Secondary | ICD-10-CM | POA: Diagnosis not present

## 2015-11-18 DIAGNOSIS — E1165 Type 2 diabetes mellitus with hyperglycemia: Secondary | ICD-10-CM | POA: Diagnosis not present

## 2015-11-18 DIAGNOSIS — R404 Transient alteration of awareness: Secondary | ICD-10-CM | POA: Diagnosis not present

## 2015-11-19 DIAGNOSIS — I2510 Atherosclerotic heart disease of native coronary artery without angina pectoris: Secondary | ICD-10-CM | POA: Diagnosis not present

## 2015-11-19 DIAGNOSIS — R4182 Altered mental status, unspecified: Secondary | ICD-10-CM | POA: Diagnosis not present

## 2015-11-19 DIAGNOSIS — R410 Disorientation, unspecified: Secondary | ICD-10-CM | POA: Diagnosis not present

## 2015-11-19 DIAGNOSIS — G4709 Other insomnia: Secondary | ICD-10-CM | POA: Diagnosis not present

## 2015-11-19 DIAGNOSIS — R404 Transient alteration of awareness: Secondary | ICD-10-CM | POA: Diagnosis not present

## 2015-11-19 DIAGNOSIS — G4089 Other seizures: Secondary | ICD-10-CM | POA: Diagnosis not present

## 2015-11-19 DIAGNOSIS — G4700 Insomnia, unspecified: Secondary | ICD-10-CM | POA: Diagnosis not present

## 2015-11-19 DIAGNOSIS — N390 Urinary tract infection, site not specified: Secondary | ICD-10-CM | POA: Diagnosis not present

## 2015-11-19 DIAGNOSIS — G309 Alzheimer's disease, unspecified: Secondary | ICD-10-CM | POA: Diagnosis not present

## 2015-11-19 DIAGNOSIS — E784 Other hyperlipidemia: Secondary | ICD-10-CM | POA: Diagnosis not present

## 2015-11-19 DIAGNOSIS — E440 Moderate protein-calorie malnutrition: Secondary | ICD-10-CM | POA: Diagnosis not present

## 2015-11-19 DIAGNOSIS — Z8673 Personal history of transient ischemic attack (TIA), and cerebral infarction without residual deficits: Secondary | ICD-10-CM | POA: Diagnosis not present

## 2015-11-19 DIAGNOSIS — F39 Unspecified mood [affective] disorder: Secondary | ICD-10-CM | POA: Diagnosis not present

## 2015-11-19 DIAGNOSIS — G9340 Encephalopathy, unspecified: Secondary | ICD-10-CM | POA: Diagnosis not present

## 2015-11-19 DIAGNOSIS — E46 Unspecified protein-calorie malnutrition: Secondary | ICD-10-CM | POA: Diagnosis not present

## 2015-11-19 DIAGNOSIS — G92 Toxic encephalopathy: Secondary | ICD-10-CM | POA: Diagnosis not present

## 2015-11-19 DIAGNOSIS — I10 Essential (primary) hypertension: Secondary | ICD-10-CM | POA: Diagnosis not present

## 2015-11-19 DIAGNOSIS — A047 Enterocolitis due to Clostridium difficile: Secondary | ICD-10-CM | POA: Diagnosis not present

## 2015-11-19 DIAGNOSIS — Z8669 Personal history of other diseases of the nervous system and sense organs: Secondary | ICD-10-CM | POA: Diagnosis not present

## 2015-11-19 DIAGNOSIS — R262 Difficulty in walking, not elsewhere classified: Secondary | ICD-10-CM | POA: Diagnosis not present

## 2015-11-19 DIAGNOSIS — E1165 Type 2 diabetes mellitus with hyperglycemia: Secondary | ICD-10-CM | POA: Diagnosis not present

## 2015-11-19 DIAGNOSIS — D490 Neoplasm of unspecified behavior of digestive system: Secondary | ICD-10-CM | POA: Diagnosis not present

## 2015-11-19 DIAGNOSIS — I509 Heart failure, unspecified: Secondary | ICD-10-CM | POA: Diagnosis not present

## 2015-11-19 DIAGNOSIS — D136 Benign neoplasm of pancreas: Secondary | ICD-10-CM | POA: Diagnosis not present

## 2015-11-19 DIAGNOSIS — B9689 Other specified bacterial agents as the cause of diseases classified elsewhere: Secondary | ICD-10-CM | POA: Diagnosis not present

## 2015-11-19 DIAGNOSIS — E119 Type 2 diabetes mellitus without complications: Secondary | ICD-10-CM | POA: Diagnosis not present

## 2015-11-19 DIAGNOSIS — G3109 Other frontotemporal dementia: Secondary | ICD-10-CM | POA: Diagnosis not present

## 2015-11-19 DIAGNOSIS — B020 Zoster encephalitis: Secondary | ICD-10-CM | POA: Diagnosis not present

## 2015-11-19 DIAGNOSIS — K219 Gastro-esophageal reflux disease without esophagitis: Secondary | ICD-10-CM | POA: Diagnosis not present

## 2015-11-22 DIAGNOSIS — R4182 Altered mental status, unspecified: Secondary | ICD-10-CM | POA: Diagnosis not present

## 2015-11-22 DIAGNOSIS — I509 Heart failure, unspecified: Secondary | ICD-10-CM | POA: Diagnosis not present

## 2015-11-22 DIAGNOSIS — G3109 Other frontotemporal dementia: Secondary | ICD-10-CM | POA: Diagnosis not present

## 2015-11-22 DIAGNOSIS — G4700 Insomnia, unspecified: Secondary | ICD-10-CM | POA: Diagnosis not present

## 2015-11-22 DIAGNOSIS — G4709 Other insomnia: Secondary | ICD-10-CM | POA: Diagnosis not present

## 2015-11-22 DIAGNOSIS — F39 Unspecified mood [affective] disorder: Secondary | ICD-10-CM | POA: Diagnosis not present

## 2015-11-22 DIAGNOSIS — E46 Unspecified protein-calorie malnutrition: Secondary | ICD-10-CM | POA: Diagnosis not present

## 2015-11-22 DIAGNOSIS — E784 Other hyperlipidemia: Secondary | ICD-10-CM | POA: Diagnosis not present

## 2015-11-22 DIAGNOSIS — I10 Essential (primary) hypertension: Secondary | ICD-10-CM | POA: Diagnosis not present

## 2015-11-22 DIAGNOSIS — Z8673 Personal history of transient ischemic attack (TIA), and cerebral infarction without residual deficits: Secondary | ICD-10-CM | POA: Diagnosis not present

## 2015-11-22 DIAGNOSIS — R262 Difficulty in walking, not elsewhere classified: Secondary | ICD-10-CM | POA: Diagnosis not present

## 2015-11-22 DIAGNOSIS — G4089 Other seizures: Secondary | ICD-10-CM | POA: Diagnosis not present

## 2015-11-22 DIAGNOSIS — N390 Urinary tract infection, site not specified: Secondary | ICD-10-CM | POA: Diagnosis not present

## 2015-11-22 DIAGNOSIS — B020 Zoster encephalitis: Secondary | ICD-10-CM | POA: Diagnosis not present

## 2015-11-22 DIAGNOSIS — K219 Gastro-esophageal reflux disease without esophagitis: Secondary | ICD-10-CM | POA: Diagnosis not present

## 2015-11-22 DIAGNOSIS — E119 Type 2 diabetes mellitus without complications: Secondary | ICD-10-CM | POA: Diagnosis not present

## 2015-11-22 DIAGNOSIS — D136 Benign neoplasm of pancreas: Secondary | ICD-10-CM | POA: Diagnosis not present

## 2015-11-22 DIAGNOSIS — A047 Enterocolitis due to Clostridium difficile: Secondary | ICD-10-CM | POA: Diagnosis not present

## 2015-12-11 ENCOUNTER — Ambulatory Visit: Payer: PPO | Attending: Family Medicine | Admitting: Physical Therapy

## 2015-12-11 DIAGNOSIS — R262 Difficulty in walking, not elsewhere classified: Secondary | ICD-10-CM | POA: Diagnosis not present

## 2015-12-11 DIAGNOSIS — R278 Other lack of coordination: Secondary | ICD-10-CM | POA: Insufficient documentation

## 2015-12-11 DIAGNOSIS — M6281 Muscle weakness (generalized): Secondary | ICD-10-CM | POA: Insufficient documentation

## 2015-12-11 NOTE — Therapy (Signed)
[place] [address] [place], [address] Phone: [phone]   Fax:  [phone]  Physical Therapy Evaluation  Patient Details  Name: Maurice Patterson MRN: [id number] Date of Birth: 04-Dec-1955 Referring Provider: Astrid Divine  Encounter Date: 12/11/2015      PT End of Session - 12/11/15 1233    Visit Number 1   Number of Visits 13   Date for PT Re-Evaluation 01/22/16   PT Start Time [id number]   PT Stop Time 1140   PT Time Calculation (min) 55 min   Equipment Utilized During Treatment Gait belt   Activity Tolerance Patient limited by lethargy   Behavior During Therapy Flat affect      Past Medical History  Diagnosis Date  . Seizures ([place])     staring spells  . Memory loss   . Arthritis   . Stroke ([place])   . Hypertension   . Diabetes mellitus without complication ([place])   . Malignant intraductal papillary mucinous tumor of pancreas ([place])   . Hernia of abdominal cavity   . Degenerative lumbar disc   . Acute MI ([place])   . TIA (transient ischemic attack)     Past Surgical History  Procedure Laterality Date  . Left arm metal plate    . Stent times 2  2013    Cardiac  . Left shoulder surgery    . Ercp N/A 09/12/2015    Procedure: ENDOSCOPIC RETROGRADE CHOLANGIOPANCREATOGRAPHY (ERCP);  Surgeon: Hulen Luster, MD;  Location: [facility] ENDOSCOPY;  Service: Gastroenterology;  Laterality: N/A;    There were no vitals filed for this visit.       Subjective Assessment - 12/11/15 1055    Subjective Pt reports multiple falls, multiple strokes, multiple MI with inconsistent details. attending PT for deconditioning at this time.    Patient is accompained by: Family member   Pertinent History Extensive and complex medical history per pt report of multiple strokes, frequent falls. Pt has caregiver present at all times and requires assistance for higher level ADLs. Pt has spent time at [facility] - unclear of length of  time or how recently, following this he was seen at [facility] where per pt's son he went from completely deconditioned to ambulating I. Also reports he was issued AD but did not bring any and is not using any at home.   Limitations Walking;Standing;Lifting   How long can you walk comfortably? inside house.   Patient Stated Goals Pt would like to return to work, be able to walk better.   Currently in Pain? No/denies            [facility] PT Assessment - 12/11/15 0001    Assessment   Medical Diagnosis general deconditioning   Referring Provider [place]   Prior Therapy none   Balance Screen   Has the patient fallen in the past 6 months Yes   How many times? yes   Has the patient had a decrease in activity level because of a fear of falling?  Yes   Is the patient reluctant to leave their home because of a fear of falling?  Yes   [place] residence   Living Arrangements Spouse/significant other   Type of [place]   Additional Comments two steps to enter home, pt requires assistance always with this.   Prior Function   Level of Independence Other (comment)  pt issued multiple devices at discharge, requires A  Vocation On disability   Leisure Pt is sedentary, due to safety concerns.   Cognition   Overall Cognitive Status History of cognitive impairments - at baseline   Attention Selective   Observation/Other Assessments-Edema    Edema --  notable edema B in ankles/feet.   Sensation   Light Touch Appears Intact   Additional Comments although intact, pt was slow to register light touch   Coordination   Heel Shin Test limited by ROM but within limit performed 10 taps on L in 20 sec.    Tone   Assessment Location --  one beat clonus on L.   ROM / Strength   AROM / PROM / Strength AROM;PROM;Strength   AROM   Overall AROM Comments pt had difficulty with instruction, notably on L side. R LE generally WNL except ankle limited with DF significantly.    Strength   Overall Strength Comments LLE grossly 3/5, RLE 4/5 except Lhip flex 2/5.   Transfers   Transfer Cueing Pt unable to back up one foot in order to sit down, required 5 min to turn, visualize chair, side step and sit down.   Ambulation/Gait   Gait Pattern Decreased arm swing - left;Decreased arm swing - right;Decreased step length - right;Decreased step length - left   Ambulation Surface Level   Gait velocity .3 m/s   Gait velocity - backwards unable   Gait Comments noted improvement in gait with decline (leaving clinic) with incr. step length. Per pt's son he was walking more normally prior to most recent round of hospitalization and this may be due primarily to deconditioning.   Balance   Balance Assessed Yes   Standardized Balance Assessment   Standardized Balance Assessment 10 meter walk test   10 Meter Walk .4 m/s   High Level Balance   High Level Balance Comments pt able to perform quiet standing with forward lean x30 sec, unable to achieve tandem stance, difficulty achieving modified tandem stance possibly due to clonus.                            PT Education - 12/11/15 1232    Education provided Yes   Education Details educated pt on attending PT at main hospital for safety.   Person(s) Educated Patient;Child(ren)   Methods Explanation   Comprehension Verbalized understanding             PT Long Term Goals - 12/11/15 1252    PT LONG TERM GOAL #1   Title Pt will have identified AD and be able to use appropriately   Baseline currently pt has SPC and RW but does not know which is more appropriate   Time 6   Period Weeks   Status New   PT LONG TERM GOAL #2   Title Pt will be able to perform sit<>stand I with use of UE   Baseline CGA for sit<>stand   Time 6   Period Weeks   Status New   PT LONG TERM GOAL #3   Title Pt will improve 10MWS to .8 m/s to improve household ambulation.   Baseline .4 m/s   Time 6   Period Weeks   Status New                Plan - 12/11/15 1237    Clinical Impression Statement Pt is a pleasant 60 y/o male with c/o deconditioning s/p several recent CVAs, MIs. pt is generally a poor  historian, son is present and was able to contribute somewhat to history. Pt demonstrates significant coordination deficits, impaired balance, muscle weakness, difficulty with walking. He would benefit from skilled PT to address these issues. PT strongly encouraged pt to attend his PT sessions at main hospital clinic.   Rehab Potential Good   Clinical Impairments Affecting Rehab Potential medical comorbidities/family assistance   PT Frequency 2x / week   PT Duration 6 weeks   PT Treatment/Interventions ADLs/Self Care Home Management;Passive range of motion;Therapeutic exercise;Manual techniques;Therapeutic activities;Functional mobility training;Balance training;Gait training;Stair training;Neuromuscular re-education   Consulted and Agree with Plan of Care Patient;Family member/caregiver   Family Member Consulted Son      Patient will benefit from skilled therapeutic intervention in order to improve the following deficits and impairments:  Abnormal gait, Decreased range of motion, Improper body mechanics, Pain, Difficulty walking, Impaired flexibility, Postural dysfunction, Decreased coordination, Decreased safety awareness, Decreased balance, Decreased mobility, Decreased strength  Visit Diagnosis: Difficulty in walking, not elsewhere classified - Plan: PT plan of care cert/re-cert  Muscle weakness (generalized) - Plan: PT plan of care cert/re-cert     Problem List Patient Active Problem List   Diagnosis Date Noted  . Confusion 10/18/2015  . Constipation 08/23/2015  . Uncontrolled type 2 diabetes mellitus with hyperglycemia, with long-term current use of insulin ([place]) 08/23/2015  . Essential hypertension 08/23/2015  . Right lower lobe pneumonia 08/23/2015  . Abdominal pain 08/21/2015  . Altered mental  status 02/19/2015    Fisher,Benjamin PT DPT 12/11/2015, 1:20 PM  [facility] [address] [place], [address] Phone: [phone]   Fax:  [phone]  Name: Maurice Patterson MRN: [id number] Date of Birth: [birth year]

## 2015-12-17 DIAGNOSIS — I10 Essential (primary) hypertension: Secondary | ICD-10-CM | POA: Diagnosis not present

## 2015-12-17 DIAGNOSIS — R739 Hyperglycemia, unspecified: Secondary | ICD-10-CM | POA: Diagnosis not present

## 2015-12-17 DIAGNOSIS — R748 Abnormal levels of other serum enzymes: Secondary | ICD-10-CM | POA: Diagnosis not present

## 2015-12-17 DIAGNOSIS — K529 Noninfective gastroenteritis and colitis, unspecified: Secondary | ICD-10-CM | POA: Diagnosis not present

## 2015-12-17 DIAGNOSIS — K869 Disease of pancreas, unspecified: Secondary | ICD-10-CM | POA: Diagnosis not present

## 2015-12-17 DIAGNOSIS — E1165 Type 2 diabetes mellitus with hyperglycemia: Secondary | ICD-10-CM | POA: Diagnosis not present

## 2015-12-19 ENCOUNTER — Encounter: Payer: Self-pay | Admitting: Occupational Therapy

## 2015-12-19 ENCOUNTER — Ambulatory Visit: Payer: PPO | Admitting: Occupational Therapy

## 2015-12-19 DIAGNOSIS — M6281 Muscle weakness (generalized): Secondary | ICD-10-CM

## 2015-12-19 DIAGNOSIS — R262 Difficulty in walking, not elsewhere classified: Secondary | ICD-10-CM | POA: Diagnosis not present

## 2015-12-19 DIAGNOSIS — R278 Other lack of coordination: Secondary | ICD-10-CM

## 2015-12-19 NOTE — Therapy (Signed)
[place] MAIN [facility] SERVICES [address] [place], [place], [address] Phone: [phone]   Fax:  [phone]  Occupational Therapy Evaluation  Patient Details  Name: Maurice Patterson MRN: [id number] Date of Birth: 59/08/1955 Referring Provider: Dyanne Carrel  Encounter Date: 12/19/2015      OT End of Session - 12/19/15 1600    Visit Number 1   Number of Visits 16   Date for OT Re-Evaluation 02/13/16   Authorization Type Medicare g code 1   OT Start Time 1042   OT Stop Time 1145   OT Time Calculation (min) 63 min   Activity Tolerance Patient tolerated treatment well   Behavior During Therapy Flat affect      Past Medical History  Diagnosis Date  . Seizures ([place])     staring spells  . Memory loss   . Arthritis   . Stroke ([place])   . Hypertension   . Diabetes mellitus without complication ([place])   . Malignant intraductal papillary mucinous tumor of pancreas ([place])   . Hernia of abdominal cavity   . Degenerative lumbar disc   . Acute MI ([place])   . TIA (transient ischemic attack)     Past Surgical History  Procedure Laterality Date  . Left arm metal plate    . Stent times 2  2013    Cardiac  . Left shoulder surgery    . Ercp N/A 09/12/2015    Procedure: ENDOSCOPIC RETROGRADE CHOLANGIOPANCREATOGRAPHY (ERCP);  Surgeon: Hulen Luster, MD;  Location: [facility] ENDOSCOPY;  Service: Gastroenterology;  Laterality: N/A;    There were no vitals filed for this visit.      Subjective Assessment - 12/19/15 1059    Subjective  Patient reports he had a stroke on Oct 18, 2015 and was hospitalized at [facility] for 7.5 weeks, did not have rehab there, had PT 2 days in the hospital.  Was transferred to [facility] and had therapies there for 2 weeks and returned home.     Patient is accompained by: Family member   Pertinent History Patient reports he had a headache and [facility] and there was not ventilation for the  chorine and he felt sick and was transported to the hospital.  Wife reports he was given [place] and he was allergic and had a reaction at the hospital. Had shingles during his hospital stay.  Patient had a pancreatic stent placed on 09/13/2015 prior to the stroke and will have to have a follow up surgery for scar tissue on 01/02/2016.   Patient Stated Goals Patient reports he would like to drive again, he has not driven since having a pancreatic stent 09/13/2015.    Currently in Pain? Yes   Pain Score 3    Pain Location Back   Pain Orientation Right;Left   Pain Descriptors / Indicators Sharp   Pain Type Chronic pain   Pain Onset More than a month ago   Pain Frequency Intermittent   Effect of Pain on Daily Activities hurts more when he lays down for any period time.           [facility] OT Assessment - 12/19/15 1112    Assessment   Diagnosis deconditioning   Referring Provider alridge   Prior Therapy OT, PT, SLP   Balance Screen   Has the patient fallen in the past 6 months Yes   How many times? 1   Has the patient had a  decrease in activity level because of a fear of falling?  Yes   Is the patient reluctant to leave their home because of a fear of falling?  Yes   Home  Environment   Family/patient expects to be discharged to: Private residence   Living Arrangements Spouse/significant other   Available Help at Discharge Family   Type of [place] One level   Alternate Level Stairs - Number of Steps 3 stairs   Bathroom Building control surveyor;Door   Advertising account planner - single point;Walker - 2 wheels;Shower seat;Bedside commode;Grab bars - tub/shower;Grab bars - toilet   Lives With Spouse;Son   Prior Function   Level of Independence Independent   Vocation On disability   ADL   Eating/Feeding Modified independent   Grooming Modified independent   Upper Body Bathing Supervision/safety    Lower Body Bathing Supervision/safety   Upper Body Dressing Independent   Lower Body Dressing Needs assist for fasteners;Increased time   Toilet Tranfer Modified independent   Toileting -  Hygiene Modified Independent   ADL comments Patient cannot go outside, no yardwork, no cooking, no heavy cleaning.     IADL   Prior Level of Function Light Housekeeping independent   Light Housekeeping Performs light daily tasks such as dishwashing, bed making   Prior Level of Function Meal Prep independent   Meal Prep Able to complete simple warm meal prep   Prior Level of Function Community Mobility independent   Medication Management Takes responsibility if medication is prepared in advance in seperate dosage   Prior Level of Function Financial Management did not perform   Mobility   Mobility Status Needs assist   Mobility Status Comments uses a cane now for ambulation   Written Expression   Dominant Hand Right   Cognition   Overall Cognitive Status Cognition to be further assessed in functional context PRN   Memory Impaired   Memory Impairment Decreased short term memory;Other (comment)  difficulty with recalling names of familiar people from past   Coordination   9 Hole Peg Test Right;Left   Right 9 Hole Peg Test 34   Left 9 Hole Peg Test 45   ROM / Strength   AROM / PROM / Strength AROM;Strength   AROM   Overall AROM Comments RUE ROM WNLs, LUE prior crush injury limited shoulder flexion to 120 degrees, limited internal rotation.Patient has metal plates in left elbow and wrist from previous accident.Limited supination on left forearm.   Strength   Overall Strength Comments RUE 4/5 overall, LUE 3/5 overall   Hand Function   Right Hand Grip (lbs) 52   Right Hand Lateral Pinch 18 lbs   Right Hand 3 Point Pinch 17 lbs   Left Hand Grip (lbs) 27   Left Hand Lateral Pinch 13 lbs   Left 3 point pinch 14 lbs   Comment 2 point pinch R 12#, Left 11#   Sensation Exercises   Stereognosis intact  bilaterally                         OT Education - 12/19/15 1559    Education provided Yes   Education Details role of OT, goals   Person(s) Educated Patient;Spouse   Methods Explanation;Demonstration   Comprehension Verbalized understanding;Returned demonstration             OT Long Term Goals -  12/19/15 1611    OT LONG TERM GOAL #1   Title Patient will complete bathing with modified independence.   Baseline supervision   Time 8   Period Weeks   Status New   OT LONG TERM GOAL #2   Title Patient will complete homemaking tasks of cleaning and laundry with modified independence.   Baseline moderate to max assist   Time 8   Period Weeks   Status New   OT LONG TERM GOAL #3   Title Patient will increase bilateral UE strength to perform yard work tasks with supervision.   Baseline unable   Time 8   Period Weeks   Status New   OT LONG TERM GOAL #4   Title Patient will complete hot meal preparation with modified independence.   Baseline light meal prep currently.   Time 8   Period Weeks   Status New               Plan - 12/19/15 1601    Clinical Impression Statement Patient is a 60 yo male who was hospitalized in February for a stroke, spent 7 weeks in the hospital with only a couple days of rehab and then was tranferred to a SNF for 2 weeks for rehabilitation.  He was discharged home and now presents for outpatient therapy.  Patient presents with muscle weakness and lack of coordination which has affected his ability to perform   daily tasks at home.  He is at a supervision level for basic self care tasks but requires increased assistance with higher level IADl tasks including medication management and wants to return to driving.  He is currently unable to complete outdoor yard work or tasks in the home such as cooking and cleaning.  He has some mild cognitive deficits with memory however his wife reports he is about 90% back to baseline with cognitive  ability.  He would benefit from skilled OT to address his stated limitations and increase independence in daily tasks at home and in the community.     Rehab Potential Good   Clinical Impairments Affecting Rehab Potential Patient has a previous crush injury to left side in which he has limited ROM and diminished use.    OT Frequency 2x / week   OT Duration 8 weeks   OT Treatment/Interventions Self-care/ADL training;Therapeutic exercise;Patient/family education;Neuromuscular education;Manual Therapy;Balance training;Therapeutic exercises;DME and/or AE instruction;Therapeutic activities;Cognitive remediation/compensation   Consulted and Agree with Plan of Care Patient;Family member/caregiver   Family Member Consulted wife, Cecille Rubin      Patient will benefit from skilled therapeutic intervention in order to improve the following deficits and impairments:  Abnormal gait, Decreased endurance, Decreased activity tolerance, Decreased knowledge of use of DME, Decreased strength, Decreased cognition, Pain, Decreased range of motion, Decreased coordination, Impaired UE functional use  Visit Diagnosis: Muscle weakness (generalized) - Plan: Ot plan of care cert/re-cert  Other lack of coordination - Plan: Ot plan of care cert/re-cert      G-Codes - [id number]    Functional Limitation Self care   Self Care Current Status [phone]) At least 40 percent but less than 60 percent impaired, limited or restricted   Self Care Goal Status [id number]) At least 1 percent but less than 20 percent impaired, limited or restricted      Problem List Patient Active Problem List   Diagnosis Date Noted  . Confusion 10/18/2015  . Constipation 08/23/2015  . Uncontrolled type 2 diabetes mellitus with hyperglycemia, with long-term current use of insulin (  [place]) 08/23/2015  . Essential hypertension 08/23/2015  . Right lower lobe pneumonia 08/23/2015  . Abdominal pain 08/21/2015  . Altered mental status 02/19/2015   Achilles Dunk, OTR/L, CLT  Ireanna Finlayson 12/19/2015, 4:16 PM  [place] MAIN [facility] SERVICES [address] [place], [place], [address] Phone: [phone]   Fax:  [phone]  Name: NHAT LEIBMAN MRN: [id number] Date of Birth: [birth year]

## 2015-12-22 ENCOUNTER — Encounter: Payer: Self-pay | Admitting: Physical Therapy

## 2015-12-22 ENCOUNTER — Ambulatory Visit: Payer: PPO | Attending: Family Medicine | Admitting: Physical Therapy

## 2015-12-22 DIAGNOSIS — R262 Difficulty in walking, not elsewhere classified: Secondary | ICD-10-CM | POA: Insufficient documentation

## 2015-12-22 DIAGNOSIS — R278 Other lack of coordination: Secondary | ICD-10-CM | POA: Insufficient documentation

## 2015-12-22 DIAGNOSIS — M6281 Muscle weakness (generalized): Secondary | ICD-10-CM | POA: Insufficient documentation

## 2015-12-22 NOTE — Patient Instructions (Signed)
Balance, Proprioception: Hip Abduction With Tubing   With tubing attached to both ankles, Standing holding onto counter, kick one leg out to side and then Return.  Repeat _10___ times  On each side.  Do ___2_ sessions per day.  http://cc.exer.us/20     Copyright  VHI. All rights reserved.  Balance, Proprioception: Hip Extension With Tubing   With tubing tied around both legs, holding onto kitchen counter, swing leg back. Return. Repeat _10___ times . Do __2__ sessions per day.  http://cc.exer.us/19    Copyright  VHI. All rights reserved.  Band Walk: Side Stepping   Tie band around legs,around ankles. Step _10__ feet to one side, then step back to start. Repeat _2-3__ feet per session. Note: Small towel between band and skin eases rubbing.  http://plyo.exer.us/76   SIT TO STAND: No Device   Sit with feet shoulder-width apart, on floor.(Make sure that you are in a chair that won't move like a chair against a wall or couch etc) Lean chest forward, raise hips up from surface. Straighten hips and knees. Weight bear equally on left and right sides. 10___ reps per set, _2__ sets per day, _5__ days per week Place left leg closer to sitting surface.  Copyright  VHI. All rights reserved.  Backward Walking   Walk backward, toes of each foot coming down first. Take long, even strides. Make sure you have a clear pathway with no obstructions when you do this. Stand beside counter and walk backward  And then walk forward doing opposite directions; repeat 10 laps 2x a day at least 5 days a week.     Toe / Heel Raise (Standing)    Standing with support, raise heels, then rock back on heels and raise toes. Repeat _10___ times.  Copyright  VHI. All rights reserved.

## 2015-12-22 NOTE — Therapy (Signed)
[place] MAIN [facility] SERVICES [address] [place], [place], [address] Phone: [phone]   Fax:  [phone]  Physical Therapy Treatment  Patient Details  Name: Maurice Patterson MRN: [id number] Date of Birth: 1955/11/24 Referring Provider: Astrid Divine  Encounter Date: 12/22/2015      PT End of Session - 12/22/15 1258    Visit Number 2   Number of Visits 13   Date for PT Re-Evaluation 01/22/16   PT Start Time 1020   PT Stop Time 1100   PT Time Calculation (min) 40 min   Equipment Utilized During Treatment Gait belt   Activity Tolerance Patient tolerated treatment well   Behavior During Therapy [facility] for tasks assessed/performed      Past Medical History  Diagnosis Date  . Seizures ([place])     staring spells  . Memory loss   . Arthritis   . Stroke ([place])   . Hypertension   . Diabetes mellitus without complication ([place])   . Malignant intraductal papillary mucinous tumor of pancreas ([place])   . Hernia of abdominal cavity   . Degenerative lumbar disc   . Acute MI ([place])   . TIA (transient ischemic attack)     Past Surgical History  Procedure Laterality Date  . Left arm metal plate    . Stent times 2  2013    Cardiac  . Left shoulder surgery    . Ercp N/A 09/12/2015    Procedure: ENDOSCOPIC RETROGRADE CHOLANGIOPANCREATOGRAPHY (ERCP);  Surgeon: Hulen Luster, MD;  Location: [facility] ENDOSCOPY;  Service: Gastroenterology;  Laterality: N/A;    There were no vitals filed for this visit.      Subjective Assessment - 12/22/15 1027    Subjective Patient reports increased left knee pain, especially with prolonged sitting; He reports doing some exercise at home such as picking things up and getting up/down from a chair;    Patient is accompained by: Family member   Pertinent History Extensive and complex medical history per pt report of multiple strokes, frequent falls. Pt has caregiver present at all times and requires assistance for higher level  ADLs. Pt has spent time at [facility] - unclear of length of time or how recently, following this he was seen at [facility] where per pt's son he went from completely deconditioned to ambulating I. Also reports he was issued AD but did not bring any and is not using any at home.   Limitations Walking;Standing;Lifting   How long can you walk comfortably? inside house.   Patient Stated Goals Pt would like to return to work, be able to walk better.   Currently in Pain? Yes   Pain Score 7    Pain Location Knee   Pain Orientation Left   Pain Descriptors / Indicators Sore   Pain Type Chronic pain       TREATMENT:  Initiated HEP: Standing with red tband around both ankles: Hip abduction x10 bilaterally; Hip extension x10 bilaterally; Side stepping 8 feet x3 laps each direction; Patient required assistance for tying band around legs. He also required mod Vcs for correct technique including to keep knees straight during hip exercise for better strengthening and to increase ROM especially with left side stepping;  Sit<>Stand without HHA x10 reps with min VCs to improve forward lean for increased ease of transfer;  Standing: Backwards walking with 1 rail assist x3 laps with mod Vcs to increase base of support for better ease of movement; Heel/toe raises x15 with  mod Vcs to increase toe raise without leaning trunk backwards;  Patient given written handout with pictures to improve compliance with HEP;  Coordination: Weaving around cones #6 x3 laps with mod Vcs to avoid kicking cones down and to increase step length for better coordination and balance; Patient often confused when learning new task requiring mod VCs and demonstration for correct technique. Then after practicing activity he is able to better negotiate cones with better accuracy.                          PT Education - 12/22/15 1249    Education provided Yes   Education Details LE strengthening, HEP   Person(s)  Educated Patient   Methods Explanation;Verbal cues;Handout   Comprehension Verbalized understanding;Returned demonstration;Verbal cues required             PT Long Term Goals - 12/11/15 1252    PT LONG TERM GOAL #1   Title Pt will have identified AD and be able to use appropriately   Baseline currently pt has SPC and RW but does not know which is more appropriate   Time 6   Period Weeks   Status New   PT LONG TERM GOAL #2   Title Pt will be able to perform sit<>stand I with use of UE   Baseline CGA for sit<>stand   Time 6   Period Weeks   Status New   PT LONG TERM GOAL #3   Title Pt will improve 10MWS to .8 m/s to improve household ambulation.   Baseline .4 m/s   Time 6   Period Weeks   Status New               Plan - 12/22/15 1312    Clinical Impression Statement Instructed patient in HEP including standing LE strengthening exercise. Patient required mod VCs and demonstration as he demonstrates poor coordination and had difficulty following single step commands. His son was present during treatment session and was educated in exercise for better safety at home. patient would benefit from additional skilled PT intervention to improve LE strength, balance and gait safety;    Rehab Potential Good   Clinical Impairments Affecting Rehab Potential medical comorbidities/family assistance   PT Frequency 2x / week   PT Duration 6 weeks   PT Treatment/Interventions ADLs/Self Care Home Management;Passive range of motion;Therapeutic exercise;Manual techniques;Therapeutic activities;Functional mobility training;Balance training;Gait training;Stair training;Neuromuscular re-education   PT Next Visit Plan work on coordination, balance   PT Home Exercise Plan initiated- see patient instructions   Consulted and Agree with Plan of Care Patient;Family member/caregiver   Family Member Consulted Son      Patient will benefit from skilled therapeutic intervention in order to improve  the following deficits and impairments:  Abnormal gait, Decreased range of motion, Improper body mechanics, Pain, Difficulty walking, Impaired flexibility, Postural dysfunction, Decreased coordination, Decreased safety awareness, Decreased balance, Decreased mobility, Decreased strength  Visit Diagnosis: Muscle weakness (generalized)  Other lack of coordination  Difficulty in walking, not elsewhere classified     Problem List Patient Active Problem List   Diagnosis Date Noted  . Confusion 10/18/2015  . Constipation 08/23/2015  . Uncontrolled type 2 diabetes mellitus with hyperglycemia, with long-term current use of insulin ([place]) 08/23/2015  . Essential hypertension 08/23/2015  . Right lower lobe pneumonia 08/23/2015  . Abdominal pain 08/21/2015  . Altered mental status 02/19/2015    Trotter,Margaret PT, DPT 12/22/2015, 1:14 PM  [place]  [facility] MAIN [facility] SERVICES [place], [place], [address] Phone: [phone]   Fax:  [phone]  Name: Maurice Patterson MRN: [id number] Date of Birth: 04-29-1956

## 2015-12-23 ENCOUNTER — Encounter: Payer: Self-pay | Admitting: Occupational Therapy

## 2015-12-23 ENCOUNTER — Ambulatory Visit: Payer: PPO | Admitting: Occupational Therapy

## 2015-12-23 DIAGNOSIS — M6281 Muscle weakness (generalized): Secondary | ICD-10-CM

## 2015-12-23 DIAGNOSIS — R278 Other lack of coordination: Secondary | ICD-10-CM

## 2015-12-26 ENCOUNTER — Encounter: Payer: Self-pay | Admitting: Occupational Therapy

## 2015-12-26 ENCOUNTER — Ambulatory Visit: Payer: PPO | Admitting: Occupational Therapy

## 2015-12-26 ENCOUNTER — Encounter: Payer: Self-pay | Admitting: Physical Therapy

## 2015-12-26 ENCOUNTER — Ambulatory Visit: Payer: PPO | Admitting: Physical Therapy

## 2015-12-26 DIAGNOSIS — R278 Other lack of coordination: Secondary | ICD-10-CM

## 2015-12-26 DIAGNOSIS — M6281 Muscle weakness (generalized): Secondary | ICD-10-CM

## 2015-12-26 DIAGNOSIS — R262 Difficulty in walking, not elsewhere classified: Secondary | ICD-10-CM

## 2015-12-26 NOTE — Therapy (Signed)
[place] MAIN [facility] SERVICES [address] [place], [place], [address] Phone: [phone]   Fax:  [phone]  Occupational Therapy Treatment  Patient Details  Name: Maurice Patterson MRN: [id number] Date of Birth: [birth year] Referring Provider: Dyanne Carrel  Encounter Date: 12/23/2015      OT End of Session - 12/25/15 1122    Visit Number 2   Number of Visits 16   Date for OT Re-Evaluation 02/13/16   Authorization Type Medicare g code 2   OT Start Time 0200   OT Stop Time 0246   OT Time Calculation (min) 46 min   Activity Tolerance Patient tolerated treatment well   Behavior During Therapy [facility] for tasks assessed/performed      Past Medical History  Diagnosis Date  . Seizures ([place])     staring spells  . Memory loss   . Arthritis   . Stroke ([place])   . Hypertension   . Diabetes mellitus without complication ([place])   . Malignant intraductal papillary mucinous tumor of pancreas ([place])   . Hernia of abdominal cavity   . Degenerative lumbar disc   . Acute MI ([place])   . TIA (transient ischemic attack)     Past Surgical History  Procedure Laterality Date  . Left arm metal plate    . Stent times 2  2013    Cardiac  . Left shoulder surgery    . Ercp N/A 09/12/2015    Procedure: ENDOSCOPIC RETROGRADE CHOLANGIOPANCREATOGRAPHY (ERCP);  Surgeon: Hulen Luster, MD;  Location: [facility] ENDOSCOPY;  Service: Gastroenterology;  Laterality: N/A;    There were no vitals filed for this visit.      Subjective Assessment - 12/26/15 1006    Subjective  Patient reports he thinks they will be going to the beach soon, whole family will be going.     Patient Stated Goals Patient reports he would like to drive again, he has not driven since having a pancreatic stent 09/13/2015.    Pain Score 7    Pain Location Elbow   Pain Orientation Left   Pain Descriptors / Indicators Aching   Pain Type Chronic pain   Pain Onset More than a month ago   Pain Frequency  Intermittent   Multiple Pain Sites No                      OT Treatments/Exercises (OP) - 12/25/15 1117    Fine Motor Coordination   Other Fine Motor Exercises Patient seen for fine motor coordination activities for RUE, manipulation of small 1/2 inch sized objects from tabletop and placing onto elevated surface, cues required for technique.   Neurological Re-education Exercises   Other Exercises 1 Patient seen for strengthening tasks 1# dowel for shoulder flexion, ABD, ADD, chest press, forwards/backwards circles for 10 reps for 1 set each.  RUE 1# weight for multi directional reaching to obtain items from various places with cues.  Grip strength on R 11# for 25 reps with rest breaks as needed, occasional guiding for activity.  Resisitive pinch pins all levels except black with right hand to place onto elevated surface and then remove with left UE.  Unable to supinate left forearm past neutral with this task.   Sensation Exercises   Stereognosis intact bilaterally                OT Education - 12/25/15 1122    Education provided Yes   Education Details HEP  Person(s) Educated Patient   Methods Explanation;Demonstration   Comprehension Verbalized understanding;Returned demonstration             OT Long Term Goals - 12/19/15 1611    OT LONG TERM GOAL #1   Title Patient will complete bathing with modified independence.   Baseline supervision   Time 8   Period Weeks   Status New   OT LONG TERM GOAL #2   Title Patient will complete homemaking tasks of cleaning and laundry with modified independence.   Baseline moderate to max assist   Time 8   Period Weeks   Status New   OT LONG TERM GOAL #3   Title Patient will increase bilateral UE strength to perform yard work tasks with supervision.   Baseline unable   Time 8   Period Weeks   Status New   OT LONG TERM GOAL #4   Title Patient will complete hot meal preparation with modified independence.    Baseline light meal prep currently.   Time 8   Period Weeks   Status New               Plan - 12/25/15 1123    Clinical Impression Statement Patient demonstrates apraxia at times with completion of tasks and requires cues and occasional tactile or guiding from therapist.  He responds well to repeated directions for tasks. He has difficulty with supination of the left forearm due to previous injury which limits his function at times.  Will continue to work towards improving strength and use of BUE for daily tasks.    Rehab Potential Good   Clinical Impairments Affecting Rehab Potential Patient has a previous crush injury to left side in which he has limited ROM and diminished use.    OT Frequency 2x / week   OT Duration 8 weeks   OT Treatment/Interventions Self-care/ADL training;Therapeutic exercise;Patient/family education;Neuromuscular education;Manual Therapy;Balance training;Therapeutic exercises;DME and/or AE instruction;Therapeutic activities;Cognitive remediation/compensation   Consulted and Agree with Plan of Care Patient;Family member/caregiver      Patient will benefit from skilled therapeutic intervention in order to improve the following deficits and impairments:  Abnormal gait, Decreased endurance, Decreased activity tolerance, Decreased knowledge of use of DME, Decreased strength, Decreased cognition, Pain, Decreased range of motion, Decreased coordination, Impaired UE functional use  Visit Diagnosis: Muscle weakness (generalized)  Other lack of coordination    Problem List Patient Active Problem List   Diagnosis Date Noted  . Confusion 10/18/2015  . Constipation 08/23/2015  . Uncontrolled type 2 diabetes mellitus with hyperglycemia, with long-term current use of insulin ([place]) 08/23/2015  . Essential hypertension 08/23/2015  . Right lower lobe pneumonia 08/23/2015  . Abdominal pain 08/21/2015  . Altered mental status 02/19/2015   Achilles Dunk, OTR/L,  CLT  Lovett,Amy 12/26/2015, 11:28 AM  [place] MAIN [facility] SERVICES [address] [place], [place], [address] Phone: [phone]   Fax:  [phone]  Name: IOSIF SURACE MRN: [id number] Date of Birth: 06-16-1956

## 2015-12-26 NOTE — Therapy (Signed)
[place] MAIN [facility] SERVICES [address] [place], [place], [address] Phone: [phone]   Fax:  [phone]  Physical Therapy Treatment  Patient Details  Name: Maurice Patterson MRN: [id number] Date of Birth: 09/23/1955 Referring Provider: Astrid Divine  Encounter Date: 12/26/2015      PT End of Session - 12/26/15 1026    Visit Number 3   Number of Visits 13   Date for PT Re-Evaluation 01/22/16   PT Start Time 0900   PT Stop Time 0945   PT Time Calculation (min) 45 min   Equipment Utilized During Treatment Gait belt   Activity Tolerance Patient tolerated treatment well   Behavior During Therapy [facility] for tasks assessed/performed      Past Medical History  Diagnosis Date  . Seizures ([place])     staring spells  . Memory loss   . Arthritis   . Stroke ([place])   . Hypertension   . Diabetes mellitus without complication ([place])   . Malignant intraductal papillary mucinous tumor of pancreas ([place])   . Hernia of abdominal cavity   . Degenerative lumbar disc   . Acute MI ([place])   . TIA (transient ischemic attack)     Past Surgical History  Procedure Laterality Date  . Left arm metal plate    . Stent times 2  2013    Cardiac  . Left shoulder surgery    . Ercp N/A 09/12/2015    Procedure: ENDOSCOPIC RETROGRADE CHOLANGIOPANCREATOGRAPHY (ERCP);  Surgeon: Hulen Luster, MD;  Location: [facility] ENDOSCOPY;  Service: Gastroenterology;  Laterality: N/A;    There were no vitals filed for this visit.      Subjective Assessment - 12/26/15 0904    Subjective Patient reports increased soreness in low back and left knee soreness. He reports still having trouble with coordination tripping over Maurice from time to time.    Patient is accompained by: Family member   Pertinent History Extensive and complex medical history per pt report of multiple strokes, frequent falls. Pt has caregiver present at all times and requires assistance for higher level ADLs. Pt has spent  time at [facility] - unclear of length of time or how recently, following this he was seen at [facility] where per pt's son he went from completely deconditioned to ambulating I. Also reports he was issued AD but did not bring any and is not using any at home.   Limitations Walking;Standing;Lifting   How long can you walk comfortably? inside house.   Patient Stated Goals Pt would like to return to work, be able to walk better.   Currently in Pain? Yes   Pain Score 6    Pain Location Back   Pain Orientation Lower   Pain Descriptors / Indicators Aching;Sore   Pain Type Chronic pain         TREATMENT: Warm up on Nustep BUE/BLE level 3 x4 min (Unbilled);  Ladder drills Forward reciprocal x6 laps with CGA for balance and mod VCs to increase step length for better foot clearance; Coordination, out-out-in-in x2 laps with min A for balance and max VCs/demonstration for better sequencing and coordination;  Forward/backward step over 1/2 bolster x10 reps with mod VCs for sequencing and foot clearance Side stepping over 1/2 bolster x10 reps with and without rail assist  4 square Clockwise/counterclockwise x5 reps with supervision and mod Vcs to increase step length for better positioning; Multiple direction x2-3 min with mod VCs for better step length, particularly with  backwards step or backwards diagonal step for better clearance;  Alternate toe taps on 4 inch step x15 with and without rail assist with mod VCs for sequencing and to improve erect head/posture for better balance challenge;  One foot on airex, one foot on step with BUE ball pass x5 each foot on step with mod A for balance and max Vcs for better posture and weight shift to reduce fall risk; Standing on airex: Heel/toe raises x15 with max VCs/demonstration for sequencing and correct technique; Airex, alternate march x105 bilaterally with 2 rail assist and mod VCs and demonstration for increased ROM to improve strengthening;                             PT Education - 12/26/15 1025    Education provided Yes   Education Details balance/coordination exercise   Person(s) Educated Patient   Methods Explanation;Verbal cues;Tactile cues;Demonstration   Comprehension Returned demonstration;Verbalized understanding;Verbal cues required;Tactile cues required;Need further instruction             PT Long Term Goals - 12/11/15 1252    PT LONG TERM GOAL #1   Title Pt will have identified AD and be able to use appropriately   Baseline currently pt has SPC and RW but does not know which is more appropriate   Time 6   Period Weeks   Status New   PT LONG TERM GOAL #2   Title Pt will be able to perform sit<>stand I with use of UE   Baseline CGA for sit<>stand   Time 6   Period Weeks   Status New   PT LONG TERM GOAL #3   Title Pt will improve 10MWS to .8 m/s to improve household ambulation.   Baseline .4 m/s   Time 6   Period Weeks   Status New               Plan - 12/26/15 1026    Clinical Impression Statement Patient has difficulty learning new tasks requiring max VCs and demonstration for correct activity and sequencing for better coordination. He often gets confused and has trouble following simple commands. PT utilized teach back to improve learning with advanced tasks such as alternate march and heel/toe raises. He had significant difficulty with ladder drills. He would benefit from additional skilled PT Intervention to improve balance/gait safety and reduce fall risk;    Rehab Potential Good   Clinical Impairments Affecting Rehab Potential medical comorbidities/family assistance   PT Frequency 2x / week   PT Duration 6 weeks   PT Treatment/Interventions ADLs/Self Care Home Management;Passive range of motion;Therapeutic exercise;Manual techniques;Therapeutic activities;Functional mobility training;Balance training;Gait training;Stair training;Neuromuscular re-education   PT  Next Visit Plan work on coordination, balance   PT Home Exercise Plan continue as given;    Consulted and Agree with Plan of Care Patient;Family member/caregiver   Family Member Consulted Son      Patient will benefit from skilled therapeutic intervention in order to improve the following deficits and impairments:  Abnormal gait, Decreased range of motion, Improper body mechanics, Pain, Difficulty walking, Impaired flexibility, Postural dysfunction, Decreased coordination, Decreased safety awareness, Decreased balance, Decreased mobility, Decreased strength  Visit Diagnosis: Muscle weakness (generalized)  Other lack of coordination  Difficulty in walking, not elsewhere classified     Problem List Patient Active Problem List   Diagnosis Date Noted  . Confusion 10/18/2015  . Constipation 08/23/2015  . Uncontrolled type 2 diabetes mellitus  with hyperglycemia, with long-term current use of insulin ([place]) 08/23/2015  . Essential hypertension 08/23/2015  . Right lower lobe pneumonia 08/23/2015  . Abdominal pain 08/21/2015  . Altered mental status 02/19/2015    Shaynah Hund PT, DPT 12/26/2015, 10:28 AM  [place] MAIN [facility] SERVICES [address] [place], [place], [address] Phone: [phone]   Fax:  [phone]  Name: Maurice Patterson MRN: [id number] Date of Birth: 05-May-1956

## 2015-12-29 ENCOUNTER — Ambulatory Visit: Payer: PPO | Admitting: Occupational Therapy

## 2015-12-29 DIAGNOSIS — M6281 Muscle weakness (generalized): Secondary | ICD-10-CM | POA: Diagnosis not present

## 2015-12-29 DIAGNOSIS — R278 Other lack of coordination: Secondary | ICD-10-CM

## 2015-12-29 NOTE — Therapy (Signed)
[place] MAIN [facility] SERVICES [address] [place], [place], [address] Phone: [phone]   Fax:  [phone]  Occupational Therapy Treatment  Patient Details  Name: Maurice Patterson MRN: [id number] Date of Birth: 1956-02-12 Referring Provider: Dyanne Carrel  Encounter Date: 12/26/2015      OT End of Session - 12/28/15 1414    Visit Number 3   Number of Visits 16   Date for OT Re-Evaluation 02/13/16   Authorization Type Medicare g code 3   OT Start Time 0947   OT Stop Time 1033   OT Time Calculation (min) 46 min   Activity Tolerance Patient tolerated treatment well   Behavior During Therapy [facility] for tasks assessed/performed      Past Medical History  Diagnosis Date  . Seizures ([place])     staring spells  . Memory loss   . Arthritis   . Stroke ([place])   . Hypertension   . Diabetes mellitus without complication ([place])   . Malignant intraductal papillary mucinous tumor of pancreas ([place])   . Hernia of abdominal cavity   . Degenerative lumbar disc   . Acute MI ([place])   . TIA (transient ischemic attack)     Past Surgical History  Procedure Laterality Date  . Left arm metal plate    . Stent times 2  2013    Cardiac  . Left shoulder surgery    . Ercp N/A 09/12/2015    Procedure: ENDOSCOPIC RETROGRADE CHOLANGIOPANCREATOGRAPHY (ERCP);  Surgeon: Hulen Luster, MD;  Location: [facility] ENDOSCOPY;  Service: Gastroenterology;  Laterality: N/A;    There were no vitals filed for this visit.      Subjective Assessment - 12/29/15 0949    Subjective  Patient reports he is a little sore from last session but no pain noted in his arms.   Patient Stated Goals Patient reports he would like to drive again, he has not driven since having a pancreatic stent 09/13/2015.    Currently in Pain? Yes   Pain Score 5    Pain Location Back   Pain Orientation Lower   Pain Descriptors / Indicators Sharp   Pain Type Chronic pain   Pain Onset More than a month ago   Pain  Frequency Intermittent   Multiple Pain Sites No                      OT Treatments/Exercises (OP) - 12/28/15 1411    Fine Motor Coordination   Other Fine Motor Exercises Patient seen for fine motor coordination activities for RUE, manipulation of small 1/2 inch sized objects from tabletop and placing onto elevated surface, cues required for technique.   Neurological Re-education Exercises   Other Exercises 1 Patient seen for bilateral UE strengthening, reciprocal UBE for 5 minutes, forwards/backwards, alternating levels of resistance from 1.5 to 1.9.  resistive putty, green for bilateral grip, 3 point pinch, 2 point pinch and lateral pinch skills for multiple reps and sets.  Multi directional reaching with shape tower, all 4 levels from a seated position with cues.  Increased difficulty on left than right and unable to reach 4th level on left side.                  OT Education - 12/28/15 1414    Education provided Yes   Education Details HEP   Person(s) Educated Patient   Methods Explanation;Demonstration;Verbal cues   Comprehension Verbal cues required;Returned demonstration;Verbalized understanding  OT Long Term Goals - 12/19/15 1611    OT LONG TERM GOAL #1   Title Patient will complete bathing with modified independence.   Baseline supervision   Time 8   Period Weeks   Status New   OT LONG TERM GOAL #2   Title Patient will complete homemaking tasks of cleaning and laundry with modified independence.   Baseline moderate to max assist   Time 8   Period Weeks   Status New   OT LONG TERM GOAL #3   Title Patient will increase bilateral UE strength to perform yard work tasks with supervision.   Baseline unable   Time 8   Period Weeks   Status New   OT LONG TERM GOAL #4   Title Patient will complete hot meal preparation with modified independence.   Baseline light meal prep currently.   Time 8   Period Weeks   Status New                Plan - 12/28/15 1415    Clinical Impression Statement Patient limited more on left than right with shoulder motion and could only complete 3 of 4 levels of shape tower this date on the left, all levels on the right.  Patient with limited supination on the left forearm and hand fatigues quickly with tasks. Will continue to work towards improving strength and coordination for daily tasks.  Patient demos difficulty with apraxia at times during session.   Rehab Potential Good   OT Frequency 2x / week   OT Duration 8 weeks   OT Treatment/Interventions Self-care/ADL training;Therapeutic exercise;Patient/family education;Neuromuscular education;Manual Therapy;Balance training;Therapeutic exercises;DME and/or AE instruction;Therapeutic activities;Cognitive remediation/compensation      Patient will benefit from skilled therapeutic intervention in order to improve the following deficits and impairments:  Abnormal gait, Decreased endurance, Decreased activity tolerance, Decreased knowledge of use of DME, Decreased strength, Decreased cognition, Pain, Decreased range of motion, Decreased coordination, Impaired UE functional use  Visit Diagnosis: Muscle weakness (generalized)  Other lack of coordination    Problem List Patient Active Problem List   Diagnosis Date Noted  . Confusion 10/18/2015  . Constipation 08/23/2015  . Uncontrolled type 2 diabetes mellitus with hyperglycemia, with long-term current use of insulin ([place]) 08/23/2015  . Essential hypertension 08/23/2015  . Right lower lobe pneumonia 08/23/2015  . Abdominal pain 08/21/2015  . Altered mental status 02/19/2015   Achilles Dunk, OTR/L, CLT  Maurice Patterson 12/29/2015, 2:18 PM  [place] MAIN [facility] SERVICES [address] [place], [place], [address] Phone: [phone]   Fax:  [phone]  Name: Maurice Patterson MRN: [id number] Date of Birth: [birth year]

## 2015-12-29 NOTE — Therapy (Signed)
[place] MAIN [facility] SERVICES [address] [place], [place], [address] Phone: [phone]   Fax:  [phone]  Occupational Therapy Treatment  Patient Details  Name: JAMESEDWARD NEELS MRN: [id number] Date of Birth: 09-10-1955 Referring Provider: Dyanne Carrel  Encounter Date: 12/29/2015      OT End of Session - 12/29/15 1445    Visit Number 4   Number of Visits 16   Date for OT Re-Evaluation 02/13/16   Authorization Type Medicare g code 4   OT Start Time 0944   OT Stop Time 1030   OT Time Calculation (min) 46 min   Activity Tolerance Patient tolerated treatment well   Behavior During Therapy [facility] for tasks assessed/performed      Past Medical History  Diagnosis Date  . Seizures ([place])     staring spells  . Memory loss   . Arthritis   . Stroke ([place])   . Hypertension   . Diabetes mellitus without complication ([place])   . Malignant intraductal papillary mucinous tumor of pancreas ([place])   . Hernia of abdominal cavity   . Degenerative lumbar disc   . Acute MI ([place])   . TIA (transient ischemic attack)     Past Surgical History  Procedure Laterality Date  . Left arm metal plate    . Stent times 2  2013    Cardiac  . Left shoulder surgery    . Ercp N/A 09/12/2015    Procedure: ENDOSCOPIC RETROGRADE CHOLANGIOPANCREATOGRAPHY (ERCP);  Surgeon: Hulen Luster, MD;  Location: [facility] ENDOSCOPY;  Service: Gastroenterology;  Laterality: N/A;    There were no vitals filed for this visit.      Subjective Assessment - 12/29/15 0949    Subjective  Patient reports he did some walking over the weekend, brought in cane this date.  Watched the race over the weekend, "my man lost".  Reports some soreness in his left hand and shoulder after last session.     Patient Stated Goals Patient reports he would like to drive again, he has not driven since having a pancreatic stent 09/13/2015.    Currently in Pain? Yes   Pain Score 5    Pain Location Back   Pain  Orientation Lower   Pain Descriptors / Indicators Sharp   Pain Type Chronic pain   Pain Onset More than a month ago   Pain Frequency Intermittent   Multiple Pain Sites No                      OT Treatments/Exercises (OP) - 12/29/15 1434    Neurological Re-education Exercises   Other Exercises 1 Patient seen this date for strengthening tasks as follows:  1.5# dowel for 2 sets of 10 reps for shoulder flexion, ABD, ADD, chest press, forwards/backwards circles, cues for technique and directions repeated often.  Saebo tower on right all 4 levels up and back from seated position, Left side only 3 levels up and back.  2 sets completed on each side.  Grip strength sustained 6.6# for 25 reps on left, 11.2# for 25 reps left.  17# for 25 reps on the right.                  OT Education - 12/29/15 1445    Education provided Yes   Education Details ROM and strength exercises   Person(s) Educated Patient   Methods Explanation;Demonstration;Verbal cues   Comprehension Verbal cues required;Returned demonstration;Verbalized understanding  OT Long Term Goals - 12/19/15 1611    OT LONG TERM GOAL #1   Title Patient will complete bathing with modified independence.   Baseline supervision   Time 8   Period Weeks   Status New   OT LONG TERM GOAL #2   Title Patient will complete homemaking tasks of cleaning and laundry with modified independence.   Baseline moderate to max assist   Time 8   Period Weeks   Status New   OT LONG TERM GOAL #3   Title Patient will increase bilateral UE strength to perform yard work tasks with supervision.   Baseline unable   Time 8   Period Weeks   Status New   OT LONG TERM GOAL #4   Title Patient will complete hot meal preparation with modified independence.   Baseline light meal prep currently.   Time 8   Period Weeks   Status New               Plan - 12/29/15 1445    Clinical Impression Statement Patient will be  having surgery on friday of this week to place a stent in his pancreas.  He was able to increase weight with dowel this date and perform 2 sets of most exercises.  Continues to benefit from skilled OT to increase strength and coordination skills to complete necessary daily tasks.    OT Frequency 2x / week   OT Duration 8 weeks   OT Treatment/Interventions Self-care/ADL training;Therapeutic exercise;Patient/family education;Neuromuscular education;Manual Therapy;Balance training;Therapeutic exercises;DME and/or AE instruction;Therapeutic activities;Cognitive remediation/compensation   Consulted and Agree with Plan of Care Patient;Family member/caregiver      Patient will benefit from skilled therapeutic intervention in order to improve the following deficits and impairments:  Abnormal gait, Decreased endurance, Decreased activity tolerance, Decreased knowledge of use of DME, Decreased strength, Decreased cognition, Pain, Decreased range of motion, Decreased coordination, Impaired UE functional use  Visit Diagnosis: Muscle weakness (generalized)  Other lack of coordination    Problem List Patient Active Problem List   Diagnosis Date Noted  . Confusion 10/18/2015  . Constipation 08/23/2015  . Uncontrolled type 2 diabetes mellitus with hyperglycemia, with long-term current use of insulin ([place]) 08/23/2015  . Essential hypertension 08/23/2015  . Right lower lobe pneumonia 08/23/2015  . Abdominal pain 08/21/2015  . Altered mental status 02/19/2015   Achilles Dunk, OTR/L, CLT  Blayke Pinera 12/29/2015, 2:47 PM  [place] MAIN [facility] SERVICES [address] [place], [place], [address] Phone: [phone]   Fax:  [phone]  Name: CHIKE MAZZOTTI MRN: [id number] Date of Birth: [birth year]

## 2015-12-30 ENCOUNTER — Encounter: Payer: PPO | Admitting: Physical Therapy

## 2016-01-01 ENCOUNTER — Ambulatory Visit: Payer: PPO | Admitting: Occupational Therapy

## 2016-01-01 DIAGNOSIS — M6281 Muscle weakness (generalized): Secondary | ICD-10-CM | POA: Diagnosis not present

## 2016-01-01 NOTE — Therapy (Signed)
[place] [facility] MAIN [facility] SERVICES [address] [place], [place], [address] Phone: [phone]   Fax:  [phone]  Occupational Therapy Treatment  Patient Details  Name: Maurice Patterson MRN: [id number] Date of Birth: 1956/03/15 Referring Provider: Hayden Pedro  Encounter Date: 01/01/2016      OT End of Session - 01/01/16 1734    Visit Number 5   Number of Visits 16   Date for OT Re-Evaluation 02/13/16   Authorization Type Medicare g code 5   OT Start Time 1530   OT Stop Time 1605   OT Time Calculation (min) 35 min   Activity Tolerance Patient tolerated treatment well   Behavior During Therapy [facility] for tasks assessed/performed      Past Medical History  Diagnosis Date  . Seizures ([facility])     staring spells  . Memory loss   . Arthritis   . Stroke ([facility])   . Hypertension   . Diabetes mellitus without complication ([facility])   . Malignant intraductal papillary mucinous tumor of pancreas ([facility])   . Hernia of abdominal cavity   . Degenerative lumbar disc   . Acute MI ([facility])   . TIA (transient ischemic attack)     Past Surgical History  Procedure Laterality Date  . Left arm metal plate    . Stent times 2  2013    Cardiac  . Left shoulder surgery    . Ercp N/A 09/12/2015    Procedure: ENDOSCOPIC RETROGRADE CHOLANGIOPANCREATOGRAPHY (ERCP);  Surgeon: Wallace Cullens, MD;  Location: [facility] ENDOSCOPY;  Service: Gastroenterology;  Laterality: N/A;    There were no vitals filed for this visit.      Subjective Assessment - 01/01/16 1546    Subjective  Pt.reports he is scheduled for surgery tomorrow to have a pancreatic stent placed.   Patient is accompained by: Family member   Pertinent History Patient reports he had a headache and [facility] and there was not ventilation for the chorine and he felt sick and was transported to the hospital.  Wife reports he was given [place] and he was allergic and had a reaction at the  hospital. Had shingles during his hospital stay.  Patient had a pancreatic stent placed on 09/13/2015 prior to the stroke and will have to have a follow up surgery for scar tissue on 01/02/2016.   Patient Stated Goals Patient reports he would like to drive again, he has not driven since having a pancreatic stent 09/13/2015.    Currently in Pain? Yes   Pain Score 5       OT TREATMENT    Therapeutic Exercise:  Pt. performed 1.5# dowel ex. For UE strengthening secondary to weakness. Bilateral shoulder flexion, chest press, circular patterns, and elbow flexion/extension were performed. Pt. Requires consistent cues for proper form, and for number of reps. Pt. performed gross gripping with grip strengthener. Pt. worked on sustaining grip while grasping pegs and reaching at various heights. Pt. Worked on pinch strengthening in the left hand for lateral, and 3pt. pinch using resistive clips.Tactlie and verbal cues were required for eliciting the desired movement. Pt. Worked on placing clips with the right hand, and removing them with the left hand.                            OT Education - 01/01/16 1733    Education provided Yes   [employer]) Educated Patient  Methods Explanation;Demonstration;Verbal cues             OT Long Term Goals - 12/19/15 1611    OT LONG TERM GOAL #1   Title Patient will complete bathing with modified independence.   Baseline supervision   Time 8   Period Weeks   Status New   OT LONG TERM GOAL #2   Title Patient will complete homemaking tasks of cleaning and laundry with modified independence.   Baseline moderate to max assist   Time 8   Period Weeks   Status New   OT LONG TERM GOAL #3   Title Patient will increase bilateral UE strength to perform yard work tasks with supervision.   Baseline unable   Time 8   Period Weeks   Status New   OT LONG TERM GOAL #4   Title Patient will complete hot meal preparation with modified independence.    Baseline light meal prep currently.   Time 8   Period Weeks   Status New               Plan - 01/01/16 1735    Clinical Impression Statement Pt. is planning to have surgery tomorrow to have a pancreatic stent placed. Pt. continues to tolerate dowel exercises, however requires consistent cues for the 3 of reps, and form.. Pt. continues to requires skilled oT services to improve strength for completion of ADlLand IADLs.    Rehab Potential Good   Clinical Impairments Affecting Rehab Potential Patient has a previous crush injury to left side in which he has limited ROM and diminished use.    OT Frequency 2x / week   OT Treatment/Interventions Self-care/ADL training;Therapeutic exercise;Patient/family education;Neuromuscular education;Manual Therapy;Balance training;Therapeutic exercises;DME and/or AE instruction;Therapeutic activities;Cognitive remediation/compensation   Consulted and Agree with Plan of Care Patient;Family member/caregiver   Family Member Consulted Son, grandchildren      Patient will benefit from skilled therapeutic intervention in order to improve the following deficits and impairments:  Abnormal gait, Decreased endurance, Decreased activity tolerance, Decreased knowledge of use of DME, Decreased strength, Decreased cognition, Pain, Decreased range of motion, Decreased coordination, Impaired UE functional use  Visit Diagnosis: Muscle weakness (generalized)    Problem List Patient Active Problem List   Diagnosis Date Noted  . Confusion 10/18/2015  . Constipation 08/23/2015  . Uncontrolled type 2 diabetes mellitus with hyperglycemia, with long-term current use of insulin ([place]) 08/23/2015  . Essential hypertension 08/23/2015  . Right lower lobe pneumonia 08/23/2015  . Abdominal pain 08/21/2015  . Altered mental status 02/19/2015   Harrel Carina, MS, OTR/L   Harrel Carina 01/01/2016, [place] MAIN  [facility] SERVICES [address] [place], [place], [address] Phone: [phone]   Fax:  [phone]  Name: Maurice Patterson MRN: [id number] Date of Birth: October 04, 1955

## 2016-01-01 NOTE — Patient Instructions (Signed)
OT TREATMENT    Therapeutic Exercise:  Pt. performed 1.5# dowel ex. For UE strengthening secondary to weakness. Bilateral shoulder flexion, chest press, circular patterns, and elbow flexion/extension were performed. Pt. Requires consistent cues for proper form, and for number of reps. Pt. performed gross gripping with grip strengthener. Pt. worked on sustaining grip while grasping pegs and reaching at various heights. Pt. Worked on pinch strengthening in the left hand for lateral, and 3pt. pinch using resistive clips.Tactlie and verbal cues were required for eliciting the desired movement. Pt. Worked on placing clips with the right hand, and removing them with the left hand.

## 2016-01-02 ENCOUNTER — Ambulatory Visit
Admission: RE | Admit: 2016-01-02 | Discharge: 2016-01-02 | Disposition: A | Discharge status: Home Health | Payer: PPO | Source: Ambulatory Visit | Attending: Gastroenterology | Admitting: Gastroenterology

## 2016-01-02 ENCOUNTER — Ambulatory Visit: Payer: PPO | Admitting: Anesthesiology

## 2016-01-02 ENCOUNTER — Encounter: Payer: Self-pay | Admitting: *Deleted

## 2016-01-02 ENCOUNTER — Ambulatory Visit: Payer: PPO

## 2016-01-02 ENCOUNTER — Encounter: Payer: PPO | Admitting: Occupational Therapy

## 2016-01-02 ENCOUNTER — Encounter
Admission: RE | Disposition: A | Discharge status: Home Health | Payer: Self-pay | Source: Ambulatory Visit | Attending: Gastroenterology

## 2016-01-02 DIAGNOSIS — M5136 Other intervertebral disc degeneration, lumbar region: Secondary | ICD-10-CM | POA: Insufficient documentation

## 2016-01-02 DIAGNOSIS — Z955 Presence of coronary angioplasty implant and graft: Secondary | ICD-10-CM | POA: Diagnosis not present

## 2016-01-02 DIAGNOSIS — I252 Old myocardial infarction: Secondary | ICD-10-CM | POA: Diagnosis not present

## 2016-01-02 DIAGNOSIS — Z794 Long term (current) use of insulin: Secondary | ICD-10-CM | POA: Insufficient documentation

## 2016-01-02 DIAGNOSIS — R413 Other amnesia: Secondary | ICD-10-CM | POA: Insufficient documentation

## 2016-01-02 DIAGNOSIS — K8050 Calculus of bile duct without cholangitis or cholecystitis without obstruction: Secondary | ICD-10-CM | POA: Diagnosis not present

## 2016-01-02 DIAGNOSIS — Z8673 Personal history of transient ischemic attack (TIA), and cerebral infarction without residual deficits: Secondary | ICD-10-CM | POA: Diagnosis not present

## 2016-01-02 DIAGNOSIS — C250 Malignant neoplasm of head of pancreas: Secondary | ICD-10-CM | POA: Insufficient documentation

## 2016-01-02 DIAGNOSIS — Z833 Family history of diabetes mellitus: Secondary | ICD-10-CM | POA: Diagnosis not present

## 2016-01-02 DIAGNOSIS — Z79899 Other long term (current) drug therapy: Secondary | ICD-10-CM | POA: Insufficient documentation

## 2016-01-02 DIAGNOSIS — Z87891 Personal history of nicotine dependence: Secondary | ICD-10-CM | POA: Insufficient documentation

## 2016-01-02 DIAGNOSIS — Z8249 Family history of ischemic heart disease and other diseases of the circulatory system: Secondary | ICD-10-CM | POA: Diagnosis not present

## 2016-01-02 DIAGNOSIS — R569 Unspecified convulsions: Secondary | ICD-10-CM | POA: Diagnosis not present

## 2016-01-02 DIAGNOSIS — I10 Essential (primary) hypertension: Secondary | ICD-10-CM | POA: Insufficient documentation

## 2016-01-02 DIAGNOSIS — Z7982 Long term (current) use of aspirin: Secondary | ICD-10-CM | POA: Insufficient documentation

## 2016-01-02 DIAGNOSIS — M1990 Unspecified osteoarthritis, unspecified site: Secondary | ICD-10-CM | POA: Insufficient documentation

## 2016-01-02 DIAGNOSIS — I2510 Atherosclerotic heart disease of native coronary artery without angina pectoris: Secondary | ICD-10-CM | POA: Diagnosis not present

## 2016-01-02 DIAGNOSIS — E119 Type 2 diabetes mellitus without complications: Secondary | ICD-10-CM | POA: Insufficient documentation

## 2016-01-02 DIAGNOSIS — J449 Chronic obstructive pulmonary disease, unspecified: Secondary | ICD-10-CM | POA: Diagnosis not present

## 2016-01-02 DIAGNOSIS — Z820 Family history of epilepsy and other diseases of the nervous system: Secondary | ICD-10-CM | POA: Insufficient documentation

## 2016-01-02 DIAGNOSIS — K8689 Other specified diseases of pancreas: Secondary | ICD-10-CM | POA: Diagnosis not present

## 2016-01-02 HISTORY — PX: ERCP: SHX5425

## 2016-01-02 LAB — GLUCOSE, CAPILLARY
Glucose-Capillary: 154 mg/dL — ABNORMAL HIGH (ref 65–99)
Glucose-Capillary: 57 mg/dL — ABNORMAL LOW (ref 65–99)

## 2016-01-02 SURGERY — ERCP, WITH INTERVENTION IF INDICATED
Anesthesia: General

## 2016-01-02 MED ORDER — SODIUM CHLORIDE 0.9 % IV SOLN
INTRAVENOUS | Status: DC
  Administered 2016-01-02: 13:00:00 via INTRAVENOUS

## 2016-01-02 MED ORDER — SODIUM CHLORIDE 0.9 % IV SOLN: INTRAVENOUS | Status: DC

## 2016-01-02 MED ORDER — DEXTROSE 50 % IV SOLN
INTRAVENOUS | Status: AC
  Administered 2016-01-02: 12.5 mL
  Filled 2016-01-02: qty 50

## 2016-01-02 MED ORDER — LIDOCAINE HCL (CARDIAC) 20 MG/ML IV SOLN
INTRAVENOUS | Status: DC | PRN
  Administered 2016-01-02: 80 mg via INTRAVENOUS

## 2016-01-02 MED ORDER — PROPOFOL 500 MG/50ML IV EMUL
INTRAVENOUS | Status: DC | PRN
  Administered 2016-01-02: 160 ug/kg/min via INTRAVENOUS

## 2016-01-02 MED ORDER — LEVOFLOXACIN IN D5W 250 MG/50ML IV SOLN
250.0000 mg | Freq: Once | INTRAVENOUS | Status: AC
  Administered 2016-01-02: 250 mg via INTRAVENOUS
  Filled 2016-01-02: qty 50

## 2016-01-02 MED ORDER — FENTANYL CITRATE (PF) 100 MCG/2ML IJ SOLN
INTRAMUSCULAR | Status: DC | PRN
  Administered 2016-01-02 (×2): 25 ug via INTRAVENOUS

## 2016-01-02 NOTE — OR Nursing (Signed)
Blood sugar 57 on arrival . 50 dextrose 12.5 grams administered. Repeat bs 154. Reported to dr Mirian Capuchin

## 2016-01-02 NOTE — Anesthesia Preprocedure Evaluation (Signed)
Anesthesia Evaluation  Patient identified by MRN, date of birth, ID band Patient awake    Reviewed: Allergy & Precautions, H&P , NPO status , Patient's Chart, lab work & pertinent test results  History of Anesthesia Complications (+) Emergence Delirium and history of anesthetic complications  Airway Mallampati: III  TM Distance: >3 FB Neck ROM: limited    Dental  (+) Poor Dentition, Chipped, Missing, Upper Dentures   Pulmonary neg shortness of breath, pneumonia, resolved, COPD, former smoker,    Pulmonary exam normal breath sounds clear to auscultation       Cardiovascular Exercise Tolerance: Good hypertension, (-) angina+ CAD, + Past MI and + DOE  Normal cardiovascular exam Rhythm:regular Rate:Normal     Neuro/Psych Seizures -,  TIACVA, Residual Symptoms negative psych ROS   GI/Hepatic negative GI ROS, Neg liver ROS,   Endo/Other  diabetes, Poorly Controlled, Type 2, Insulin Dependent  Renal/GU negative Renal ROS  negative genitourinary   Musculoskeletal  (+) Arthritis ,   Abdominal   Peds  Hematology negative hematology ROS (+)   Anesthesia Other Findings Past Medical History:   Seizures (HCC)                                                 Comment:staring spells   Memory loss                                                  Arthritis                                                    Stroke ([place])                                                 Hypertension                                                 Diabetes mellitus without complication (HCC)                 Malignant intraductal papillary mucinous tumor*              Hernia of abdominal cavity                                   Degenerative lumbar disc                                     Acute MI ([place])  TIA (transient ischemic attack)                             Past Surgical History:   left arm metal  plate                                          Stent times 2                                    2013           Comment:Cardiac   Left shoulder surgery                                           Reproductive/Obstetrics negative OB ROS                             Anesthesia Physical  Anesthesia Plan  ASA: III  Anesthesia Plan: General   Post-op Pain Management:    Induction:   Airway Management Planned:   Additional Equipment:   Intra-op Plan:   Post-operative Plan:   Informed Consent: I have reviewed the patients History and Physical, chart, labs and discussed the procedure including the risks, benefits and alternatives for the proposed anesthesia with the patient or authorized representative who has indicated his/her understanding and acceptance.   Dental Advisory Given  Plan Discussed with: Anesthesiologist, CRNA and Surgeon  Anesthesia Plan Comments:         Anesthesia Quick Evaluation

## 2016-01-02 NOTE — Op Note (Signed)
[facility] Patient Name: Maurice Patterson Procedure Date: 01/02/2016 1:34 PM MRN: [id number] Account #: [id number] Date of Birth: Nov 25, 1955 Admit Type: Outpatient Age: 60 Room: [facility] ENDO ROOM 4 Gender: Male Note Status: Finalized Procedure:            ERCP Indications:          Hx of IPMN at pancreatic head. Here for pancreatic                        stent exchange. Initially scheduled in 3/17 but                        postponed due to long hospitalization at [facility]. Providers:            Lupita Dawn. Candace Cruise, MD Referring MD:         [name], MD (Referring MD) Medicines:            Monitored Anesthesia Care Complications:        No immediate complications. Procedure:            Pre-Anesthesia Assessment:                       - Prior to the procedure, a History and Physical was                        performed, and patient medications, allergies and                        sensitivities were reviewed. The patient's tolerance of                        previous anesthesia was reviewed.                       - The risks and benefits of the procedure and the                        sedation options and risks were discussed with the                        patient. All questions were answered and informed                        consent was obtained.                       - After reviewing the risks and benefits, the patient                        was deemed in satisfactory condition to undergo the                        procedure.                       After obtaining informed consent, the scope was passed                        under direct vision. Throughout the procedure, the  patient's blood pressure, pulse, and oxygen saturations                        were monitored continuously. The Endoscope was                        introduced through the mouth, and used to inject                        contrast into and used to inject  contrast into the bile                        duct and ventral pancreatic duct. The ERCP was                        performed with difficulty due to abnormal anatomy. The                        patient tolerated the procedure well. Findings:      The scout film was normal. The esophagus was successfully intubated       under direct vision. The scope was advanced to a normal major papilla in       the descending duodenum without detailed examination of the pharynx,       larynx and associated structures, and upper GI tract. The upper GI tract       was grossly normal. Wide open ampulla with thick mucous discharge.       Previous PD stent no longer visible. The ventral pancreatic duct was       deeply cannulated with the short-nosed traction sphincterotome. Contrast       was injected. I personally interpreted the pancreatic duct images.       Ductal flow of contrast was adequate. Image quality was adequate.       Contrast extended to the proximal pancreatic duct. The ventral       pancreatic duct in the head of the pancreas was dilated moderately.       Diffuse irregularity of the pancreatic duct was seen in the ventral       pancreatic duct in the head of the pancreas. The pancreatic duct in the       genu of the pancreas was completely obstructed by a narrowing that did       not appear to be a stone or a mass. Complete obstruction of pancreatic       duct. No contrast passes through this area. Guidewire will not go any       further. Therefore, new PD stent not placed. Impression:           - Moderate dilatation of the ventral pancreatic duct in                        the head of the pancreas was found.                       - An irregularity was found in the ventral pancreatic                        duct in the head of the pancreas.                       -  A pancreatic obstruction was found in the near genu                        of the pancreas. Recommendation:       - Discharge  patient to home.                       - Observe patient's clinical course.                       - The findings and recommendations were discussed with                        the patient's family.                       - Continue pancreatic enzymes for chronic diarrhea.                        Will need surgery. Procedure Code(s):    --- Professional ---                       [phone], Endoscopic retrograde cholangiopancreatography                        (ERCP); diagnostic, including collection of specimen(s)                        by brushing or washing, when performed (separate                        procedure) Diagnosis Code(s):    --- Professional ---                       K86.89, Other specified diseases of pancreas                       R93.2, Abnormal findings on diagnostic imaging of liver                        and biliary tract CPT copyright 2016 American Medical Association. All rights reserved. The codes documented in this report are preliminary and upon coder review may  be revised to meet current compliance requirements. Hulen Luster, MD 01/02/2016 2:17:33 PM This report has been signed electronically. Number of Addenda: 0 Note Initiated On: 01/02/2016 1:34 PM      [facility]

## 2016-01-02 NOTE — Transfer of Care (Signed)
Immediate Anesthesia Transfer of Care Note  Patient: Maurice Patterson  Procedure(s) Performed: Procedure(s): ENDOSCOPIC RETROGRADE CHOLANGIOPANCREATOGRAPHY (ERCP) (N/A)  Patient Location: PACU and Endoscopy Unit  Anesthesia Type:General  Level of Consciousness: sedated  Airway & Oxygen Therapy: Patient Spontanous Breathing and Patient connected to nasal cannula oxygen  Post-op Assessment: Report given to RN and Post -op Vital signs reviewed and stable  Post vital signs: Reviewed and stable  Last Vitals: 1420 - 95 hr 100% sat 14 resp 105/ 75 Filed Vitals:   01/02/16 1225  BP: 149/79  Pulse: 97  Temp: 36.3 C  Resp: 18    Last Pain: There were no vitals filed for this visit.       Complications: No apparent anesthesia complications

## 2016-01-02 NOTE — H&P (Signed)
Primary Care Physician:  [name], MD Primary Gastroenterologist:  Dr. Candace Cruise  Pre-Procedure History & Physical: HPI:  Maurice Patterson is a 60 y.o. male is here for an {ERCP.   Past Medical History  Diagnosis Date  . Seizures ([place])     staring spells  . Memory loss   . Arthritis   . Stroke ([place])   . Hypertension   . Diabetes mellitus without complication ([place])   . Malignant intraductal papillary mucinous tumor of pancreas ([place])   . Hernia of abdominal cavity   . Degenerative lumbar disc   . Acute MI ([place])   . TIA (transient ischemic attack)     Past Surgical History  Procedure Laterality Date  . Left arm metal plate    . Stent times 2  2013    Cardiac  . Left shoulder surgery    . Ercp N/A 09/12/2015    Procedure: ENDOSCOPIC RETROGRADE CHOLANGIOPANCREATOGRAPHY (ERCP);  Surgeon: Hulen Luster, MD;  Location: [facility] ENDOSCOPY;  Service: Gastroenterology;  Laterality: N/A;    Prior to Admission medications   Medication Sig Start Date End Date Taking? Authorizing Provider  Acetaminophen 500 MG coapsule Take 500 mg by mouth 2 (two) times daily.    Historical Provider, MD  aspirin 81 MG tablet Take 81 mg by mouth daily.    Historical Provider, MD  atorvastatin (LIPITOR) 80 MG tablet Take 1 tablet by mouth daily. 03/12/15   Historical Provider, MD  cholestyramine Lucrezia Starch) 4 GM/DOSE powder Take 4 g by mouth 6 (six) times daily.    Historical Provider, MD  clopidogrel (PLAVIX) 75 MG tablet Take 1 tablet by mouth daily.    Historical Provider, MD  diphenoxylate-atropine (LOMOTIL) 2.5-0.025 MG tablet Take 1 tablet by mouth 4 (four) times daily as needed for diarrhea or loose stools. Patient taking differently: Take 1 tablet by mouth 4 (four) times daily as needed for diarrhea or loose stools. Wife reports takes 6 per day 05/27/15 05/26/16  Joanne Gavel, MD  furosemide (LASIX) 40 MG tablet Take 1 tablet by mouth daily.    Historical Provider, MD  guaiFENesin-codeine 100-10 MG/5ML  syrup 5-10 ml po qhs prn Patient not taking: Reported on 08/21/2015 08/08/15   Norval Gable, MD  insulin aspart (NOVOLOG FLEXPEN) 100 UNIT/ML FlexPen Inject 30 Units into the skin 3 (three) times daily.  03/18/15   Historical Provider, MD  insulin glargine (LANTUS) 100 UNIT/ML injection Inject 0.8 mLs (80 Units total) into the skin at bedtime. 08/23/15   Theodoro Grist, MD  lamoTRIgine (LAMICTAL) 100 MG tablet Take 100 mg by mouth 2 (two) times daily.    Historical Provider, MD  levofloxacin (LEVAQUIN) 500 MG tablet Take 1 tablet (500 mg total) by mouth daily. 08/23/15   Theodoro Grist, MD  losartan (COZAAR) 100 MG tablet Take 1 tablet by mouth daily. 04/28/14   Historical Provider, MD  Melatonin 3 MG TABS Take 3 mg by mouth.    Historical Provider, MD  metoprolol succinate (TOPROL-XL) 25 MG 24 hr tablet Take 1 tablet by mouth daily.    Historical Provider, MD  nitroGLYCERIN (NITROSTAT) 0.4 MG SL tablet Place 1 tablet under the tongue every 5 (five) minutes as needed for chest pain.     Historical Provider, MD  omeprazole (PRILOSEC) 20 MG capsule Take 1 capsule by mouth daily. 05/20/15   Historical Provider, MD  polyethylene glycol (MIRALAX / GLYCOLAX) packet Take 17 g by mouth daily. Patient not taking: Reported on 12/19/2015 08/23/15  Theodoro Grist, MD  potassium chloride (K-DUR) 10 MEQ tablet Take 10 mEq by mouth 2 (two) times daily.    Historical Provider, MD    Allergies as of 12/31/2015 - Review Complete 12/29/2015  Allergen Reaction Noted  . Hydrocodone Anaphylaxis 02/19/2015  . Morphine Other (See Comments) 05/27/2015  . Ambien [zolpidem] Other (See Comments) 02/19/2015  . Brilinta [ticagrelor] Other (See Comments) 02/19/2015  . Flexeril [cyclobenzaprine] Other (See Comments) 12/19/2015  . Haldol [haloperidol lactate] Other (See Comments) 12/19/2015  . Levetiracetam Diarrhea and Other (See Comments) 05/27/2015  . Lorazepam Hives 05/27/2015  . Risperdal [risperidone] Other (See Comments)  12/19/2015  . Trazodone Other (See Comments) 05/27/2015  . Penicillins Rash 02/19/2015    Family History  Problem Relation Age of Onset  . Diabetes Mother   . Hypertension Mother   . Alzheimer's disease Father     Social History   Social History  . Marital Status: Married    Spouse Name: N/A  . Number of Children: N/A  . Years of Education: N/A   Occupational History  . Not on file.   Social History Main Topics  . Smoking status: Former Research scientist (life sciences)  . Smokeless tobacco: Never Used     Comment: used to smoke 2PD for 40 yrs, quit about 4 years ago  . Alcohol Use: No     Comment: occasional  . Drug Use: No  . Sexual Activity: Not on file   Other Topics Concern  . Not on file   Social History Narrative   Lives at home with wife and son. Ambulatory at baseline.    Review of Systems: See HPI, otherwise negative ROS  Physical Exam: BP 149/79 mmHg  Pulse 97  Temp(Src) 97.3 F (36.3 C) (Tympanic)  Resp 18  Ht 5\' 7"  (1.702 m)  Wt 150 lb (68.04 kg)  BMI 23.49 kg/m2  SpO2 97% General:   Alert,  pleasant and cooperative in NAD Head:  Normocephalic and atraumatic. Neck:  Supple; no masses or thyromegaly. Lungs:  Clear throughout to auscultation.    Heart:  Regular rate and rhythm. Abdomen:  Soft, nontender and nondistended. Normal bowel sounds, without guarding, and without rebound.   Neurologic:  Alert and  oriented x4;  grossly normal neurologically.  Impression/Plan: Maurice Patterson is here for an ERCP to be performed for IPMN of at pancreatic head, pancreatic stent exchange  Risks, benefits, limitations, and alternatives regarding ERCP have been reviewed with the patient.  Questions have been answered.  All parties agreeable.   Darean Rote, Lupita Dawn, MD  01/02/2016, 1:26 PM

## 2016-01-05 ENCOUNTER — Encounter: Payer: Self-pay | Admitting: Gastroenterology

## 2016-01-05 NOTE — Anesthesia Postprocedure Evaluation (Signed)
Anesthesia Post Note  Patient: Maurice Patterson  Procedure(s) Performed: Procedure(s) (LRB): ENDOSCOPIC RETROGRADE CHOLANGIOPANCREATOGRAPHY (ERCP) (N/A)  Patient location during evaluation: Endoscopy Anesthesia Type: General Level of consciousness: awake and alert Pain management: pain level controlled Vital Signs Assessment: post-procedure vital signs reviewed and stable Respiratory status: spontaneous breathing, nonlabored ventilation, respiratory function stable and patient connected to nasal cannula oxygen Cardiovascular status: blood pressure returned to baseline and stable Postop Assessment: no signs of nausea or vomiting Anesthetic complications: no    Last Vitals:  Filed Vitals:   01/02/16 1440 01/02/16 1450  BP: 133/85 142/88  Pulse: 90 92  Temp:    Resp: 18 15    Last Pain:  Filed Vitals:   01/02/16 1451  PainSc: Asleep                 Martha Clan

## 2016-01-06 DIAGNOSIS — M1990 Unspecified osteoarthritis, unspecified site: Secondary | ICD-10-CM | POA: Diagnosis not present

## 2016-01-06 DIAGNOSIS — Z8673 Personal history of transient ischemic attack (TIA), and cerebral infarction without residual deficits: Secondary | ICD-10-CM | POA: Diagnosis not present

## 2016-01-06 DIAGNOSIS — E119 Type 2 diabetes mellitus without complications: Secondary | ICD-10-CM | POA: Diagnosis not present

## 2016-01-06 DIAGNOSIS — Z794 Long term (current) use of insulin: Secondary | ICD-10-CM | POA: Diagnosis not present

## 2016-01-06 DIAGNOSIS — K869 Disease of pancreas, unspecified: Secondary | ICD-10-CM | POA: Diagnosis not present

## 2016-01-06 DIAGNOSIS — Z79899 Other long term (current) drug therapy: Secondary | ICD-10-CM | POA: Diagnosis not present

## 2016-01-06 DIAGNOSIS — Z87891 Personal history of nicotine dependence: Secondary | ICD-10-CM | POA: Diagnosis not present

## 2016-01-06 DIAGNOSIS — K861 Other chronic pancreatitis: Secondary | ICD-10-CM | POA: Diagnosis not present

## 2016-01-06 DIAGNOSIS — E785 Hyperlipidemia, unspecified: Secondary | ICD-10-CM | POA: Diagnosis not present

## 2016-01-06 DIAGNOSIS — I252 Old myocardial infarction: Secondary | ICD-10-CM | POA: Diagnosis not present

## 2016-01-06 DIAGNOSIS — I10 Essential (primary) hypertension: Secondary | ICD-10-CM | POA: Diagnosis not present

## 2016-01-07 ENCOUNTER — Encounter: Payer: Self-pay | Admitting: Occupational Therapy

## 2016-01-07 ENCOUNTER — Ambulatory Visit: Payer: PPO

## 2016-01-07 ENCOUNTER — Ambulatory Visit: Payer: PPO | Admitting: Occupational Therapy

## 2016-01-07 DIAGNOSIS — M6281 Muscle weakness (generalized): Secondary | ICD-10-CM | POA: Diagnosis not present

## 2016-01-07 DIAGNOSIS — R278 Other lack of coordination: Secondary | ICD-10-CM

## 2016-01-07 NOTE — Therapy (Signed)
[place] MAIN [facility] SERVICES [address] [place], [place], [address] Phone: [phone]   Fax:  [phone]  Physical Therapy Treatment  Patient Details  Name: Maurice Patterson MRN: [id number] Date of Birth: 04/09/1956 Referring Provider: Astrid Divine  Encounter Date: 01/07/2016      PT End of Session - 01/07/16 1403    Visit Number 4   Number of Visits 13   Date for PT Re-Evaluation 01/22/16   PT Start Time 1115   PT Stop Time 1200   PT Time Calculation (min) 45 min   Equipment Utilized During Treatment Gait belt   Activity Tolerance Patient tolerated treatment well   Behavior During Therapy [facility] for tasks assessed/performed      Past Medical History  Diagnosis Date  . Seizures ([place])     staring spells  . Memory loss   . Arthritis   . Stroke ([place])   . Hypertension   . Diabetes mellitus without complication ([place])   . Malignant intraductal papillary mucinous tumor of pancreas ([place])   . Hernia of abdominal cavity   . Degenerative lumbar disc   . Acute MI ([place])   . TIA (transient ischemic attack)     Past Surgical History  Procedure Laterality Date  . Left arm metal plate    . Stent times 2  2013    Cardiac  . Left shoulder surgery    . Ercp N/A 09/12/2015    Procedure: ENDOSCOPIC RETROGRADE CHOLANGIOPANCREATOGRAPHY (ERCP);  Surgeon: Hulen Luster, MD;  Location: [facility] ENDOSCOPY;  Service: Gastroenterology;  Laterality: N/A;  . Ercp N/A 01/02/2016    Procedure: ENDOSCOPIC RETROGRADE CHOLANGIOPANCREATOGRAPHY (ERCP);  Surgeon: Hulen Luster, MD;  Location: [facility] ENDOSCOPY;  Service: Gastroenterology;  Laterality: N/A;    There were no vitals filed for this visit.      Subjective Assessment - 01/07/16 1401    Subjective pt reports he has been doing pretty good   Patient is accompained by: Family member   Pertinent History Extensive and complex medical history per pt report of multiple strokes, frequent falls. Pt has caregiver present at  all times and requires assistance for higher level ADLs. Pt has spent time at [facility] - unclear of length of time or how recently, following this he was seen at [facility] where per pt's son he went from completely deconditioned to ambulating I. Also reports he was issued AD but did not bring any and is not using any at home.   Limitations Walking;Standing;Lifting   How long can you walk comfortably? inside house.   Patient Stated Goals Pt would like to return to work, be able to walk better.   Currently in Pain? Yes   Pain Score 2    Pain Location Back   Pain Orientation Lower   Pain Onset More than a month ago        NMR: \ Nustep L 3 x 4 min (unbilled) Agility ladder simple steps lateral and fwd & in / out of box x 10 min. CGA for safety, pt needs mod/max cues  standing on AIREX static x 1 min, then progressed to overhead UE lift 2x10 Standing on AIREX with L/R head turns 2x10 Ball bounce over ground 71ft x 6 Standing ball toss x 15 Standing on AIREX ball toss into basket x 15 Standing 1 foot on AIREX 1 foot on 6in step ball toss into basket x 10 L/R Toe taps on 6in step 2x20  pt requires CGA  for safety on balance exercises                           PT Education - 01/07/16 1402    Education provided Yes   Education Details upright posture    Person(s) Educated Patient   Methods Explanation   Comprehension Verbalized understanding;Tactile cues required;Need further instruction             PT Long Term Goals - 12/11/15 1252    PT LONG TERM GOAL #1   Title Pt will have identified AD and be able to use appropriately   Baseline currently pt has SPC and RW but does not know which is more appropriate   Time 6   Period Weeks   Status New   PT LONG TERM GOAL #2   Title Pt will be able to perform sit<>stand I with use of UE   Baseline CGA for sit<>stand   Time 6   Period Weeks   Status New   PT LONG TERM GOAL #3   Title Pt will improve 10MWS to .8  m/s to improve household ambulation.   Baseline .4 m/s   Time 6   Period Weeks   Status New               Plan - 01/07/16 1403    Clinical Impression Statement pt has difficulty on compliant surfaces such as the AIREX and tends to crouch down when he feels unsteady. pt needed mod-max cues for most exercises today, but improved with practice.    Rehab Potential Good   Clinical Impairments Affecting Rehab Potential medical comorbidities/family assistance   PT Frequency 2x / week   PT Duration 6 weeks   PT Treatment/Interventions ADLs/Self Care Home Management;Passive range of motion;Therapeutic exercise;Manual techniques;Therapeutic activities;Functional mobility training;Balance training;Gait training;Stair training;Neuromuscular re-education   PT Next Visit Plan work on coordination, balance   PT Home Exercise Plan continue as given;    Consulted and Agree with Plan of Care Patient;Family member/caregiver   Family Member Consulted Son      Patient will benefit from skilled therapeutic intervention in order to improve the following deficits and impairments:  Abnormal gait, Decreased range of motion, Improper body mechanics, Pain, Difficulty walking, Impaired flexibility, Postural dysfunction, Decreased coordination, Decreased safety awareness, Decreased balance, Decreased mobility, Decreased strength  Visit Diagnosis: Muscle weakness (generalized)     Problem List Patient Active Problem List   Diagnosis Date Noted  . Confusion 10/18/2015  . Constipation 08/23/2015  . Uncontrolled type 2 diabetes mellitus with hyperglycemia, with long-term current use of insulin ([place]) 08/23/2015  . Essential hypertension 08/23/2015  . Right lower lobe pneumonia 08/23/2015  . Abdominal pain 08/21/2015  . Altered mental status 02/19/2015   Gorden Harms. Keisha Amer, PT, DPT [phone]  Zaelynn Fuchs 01/07/2016, 2:04 PM  [place] MAIN [facility] SERVICES [address] [place], [place], [address] Phone: [phone]   Fax:  [phone]  Name: Maurice Patterson MRN: [id number] Date of Birth: 02/01/1956

## 2016-01-08 NOTE — Therapy (Signed)
[place] [facility] MAIN [facility] SERVICES [address] [place], [place], [address] Phone: [phone]   Fax:  [phone]  Occupational Therapy Treatment  Patient Details  Name: Maurice Patterson MRN: [id number] Date of Birth: 1955-09-03 Referring Provider: Hayden Patterson  Encounter Date: 01/07/2016      OT End of Session - 01/07/16 1550    Visit Number 6   Number of Visits 16   Date for OT Re-Evaluation 02/13/16   Authorization Type Medicare g code 6   OT Start Time 1045   OT Stop Time 1115   OT Time Calculation (min) 30 min   Activity Tolerance Patient tolerated treatment well   Behavior During Therapy [facility] for tasks assessed/performed      Past Medical History  Diagnosis Date  . Seizures (HCC)     staring spells  . Memory loss   . Arthritis   . Stroke (HCC)   . Hypertension   . Diabetes mellitus without complication (HCC)   . Malignant intraductal papillary mucinous tumor of pancreas (HCC)   . Hernia of abdominal cavity   . Degenerative lumbar disc   . Acute MI (HCC)   . TIA (transient ischemic attack)     Past Surgical History  Procedure Laterality Date  . Left arm metal plate    . Stent times 2  2013    Cardiac  . Left shoulder surgery    . Ercp N/A 09/12/2015    Procedure: ENDOSCOPIC RETROGRADE CHOLANGIOPANCREATOGRAPHY (ERCP);  Surgeon: Wallace Cullens, MD;  Location: [facility] ENDOSCOPY;  Service: Gastroenterology;  Laterality: N/A;  . Ercp N/A 01/02/2016    Procedure: ENDOSCOPIC RETROGRADE CHOLANGIOPANCREATOGRAPHY (ERCP);  Surgeon: Wallace Cullens, MD;  Location: [facility] ENDOSCOPY;  Service: Gastroenterology;  Laterality: N/A;    There were no vitals filed for this visit.      Subjective Assessment - 01/07/16 1548    Subjective  Patient reports he could not have surgery, may have to have chemo instead.    Patient Stated Goals Patient reports he would like to drive again, he has not driven since having a pancreatic stent 09/13/2015.    Currently in  Pain? Yes   Pain Score 2    Pain Location Back   Pain Orientation Lower   Pain Descriptors / Indicators Sharp   Pain Type Chronic pain   Pain Onset More than a month ago   Pain Frequency Intermittent   Multiple Pain Sites No                      OT Treatments/Exercises (OP) - 01/07/16 2232    Neurological Re-education Exercises   Other Exercises 1 Patient seen for strengthening tasks of grip and pinch with red theraputty bilateral hands, UBE for 2.0 to 2.2 resistance, forwards/backwards, alternating directions.  Dowel stick in a vertical position, walking hand up and down for multiple repetitions.  Dowel exercises with 1.5# for shoulder flexion, chest press and ABD/ADD, cues for technique.                  OT Education - 01/07/16 1550    Education provided Yes   Education Details HEP, strengthening tasks, ROM   Person(s) Educated Patient   Methods Explanation;Demonstration;Verbal cues   Comprehension Verbal cues required;Returned demonstration;Verbalized understanding             OT Long Term Goals - 12/19/15 1611    OT LONG TERM GOAL #1   Title Patient will complete  bathing with modified independence.   Baseline supervision   Time 8   Period Weeks   Status New   OT LONG TERM GOAL #2   Title Patient will complete homemaking tasks of cleaning and laundry with modified independence.   Baseline moderate to max assist   Time 8   Period Weeks   Status New   OT LONG TERM GOAL #3   Title Patient will increase bilateral UE strength to perform yard work tasks with supervision.   Baseline unable   Time 8   Period Weeks   Status New   OT LONG TERM GOAL #4   Title Patient will complete hot meal preparation with modified independence.   Baseline light meal prep currently.   Time 8   Period Weeks   Status New               Plan - 01/07/16 1550    Clinical Impression Statement Patient was not able to have surgery on his pancreas and reports  he may have to have chemo as a form of treatment.  He continues to require cues for exercises and requires occasional guiding and direction to complete tasks.  Will continue to work towards strengthening for daily tasks.    Rehab Potential Good   OT Frequency 2x / week   OT Duration 8 weeks   OT Treatment/Interventions Self-care/ADL training;Therapeutic exercise;Patient/family education;Neuromuscular education;Manual Therapy;Balance training;Therapeutic exercises;DME and/or AE instruction;Therapeutic activities;Cognitive remediation/compensation   Consulted and Agree with Plan of Care Patient      Patient will benefit from skilled therapeutic intervention in order to improve the following deficits and impairments:  Abnormal gait, Decreased endurance, Decreased activity tolerance, Decreased knowledge of use of DME, Decreased strength, Decreased cognition, Pain, Decreased range of motion, Decreased coordination, Impaired UE functional use  Visit Diagnosis: Muscle weakness (generalized)  Other lack of coordination    Problem List Patient Active Problem List   Diagnosis Date Noted  . Confusion 10/18/2015  . Constipation 08/23/2015  . Uncontrolled type 2 diabetes mellitus with hyperglycemia, with long-term current use of insulin ([place]) 08/23/2015  . Essential hypertension 08/23/2015  . Right lower lobe pneumonia 08/23/2015  . Abdominal pain 08/21/2015  . Altered mental status 02/19/2015   Achilles Dunk, OTR/L, CLT  [name] 01/08/2016, 10:38 PM  [place] MAIN [facility] SERVICES [address] [place], [place], [address] Phone: [phone]   Fax:  [phone]  Name: Maurice Patterson MRN: [id number] Date of Birth: September 07, 1955

## 2016-01-09 ENCOUNTER — Encounter: Payer: Self-pay | Admitting: Occupational Therapy

## 2016-01-09 ENCOUNTER — Ambulatory Visit: Payer: PPO | Admitting: Occupational Therapy

## 2016-01-09 DIAGNOSIS — M6281 Muscle weakness (generalized): Secondary | ICD-10-CM

## 2016-01-09 DIAGNOSIS — R278 Other lack of coordination: Secondary | ICD-10-CM

## 2016-01-12 ENCOUNTER — Encounter: Payer: PPO | Admitting: Occupational Therapy

## 2016-01-12 NOTE — Therapy (Signed)
[place] MAIN [facility] SERVICES [address] [place], [place], [address] Phone: [phone]   Fax:  [phone]  Occupational Therapy Treatment  Patient Details  Name: Maurice Patterson MRN: [id number] Date of Birth: 1956/05/05 Referring Provider: Dyanne Carrel  Encounter Date: 01/09/2016      OT End of Session - 01/11/16 1645    Visit Number 7   Number of Visits 16   Date for OT Re-Evaluation 02/13/16   Authorization Type Medicare g code 7   OT Start Time 1031   OT Stop Time 1117   OT Time Calculation (min) 46 min   Activity Tolerance Patient tolerated treatment well   Behavior During Therapy [facility] for tasks assessed/performed      Past Medical History  Diagnosis Date  . Seizures ([place])     staring spells  . Memory loss   . Arthritis   . Stroke ([place])   . Hypertension   . Diabetes mellitus without complication ([place])   . Malignant intraductal papillary mucinous tumor of pancreas ([place])   . Hernia of abdominal cavity   . Degenerative lumbar disc   . Acute MI ([place])   . TIA (transient ischemic attack)     Past Surgical History  Procedure Laterality Date  . Left arm metal plate    . Stent times 2  2013    Cardiac  . Left shoulder surgery    . Ercp N/A 09/12/2015    Procedure: ENDOSCOPIC RETROGRADE CHOLANGIOPANCREATOGRAPHY (ERCP);  Surgeon: Hulen Luster, MD;  Location: [facility] ENDOSCOPY;  Service: Gastroenterology;  Laterality: N/A;  . Ercp N/A 01/02/2016    Procedure: ENDOSCOPIC RETROGRADE CHOLANGIOPANCREATOGRAPHY (ERCP);  Surgeon: Hulen Luster, MD;  Location: [facility] ENDOSCOPY;  Service: Gastroenterology;  Laterality: N/A;    There were no vitals filed for this visit.      Subjective Assessment - 01/11/16 1601    Subjective  Patient reports he thought they would go to the beach this weekend but not sure if they have reserved a place to stay.  Patient reports feeling better today, not sure when he will go back to the doctor again soon.     Patient  Stated Goals Patient reports he would like to drive again, he has not driven since having a pancreatic stent 09/13/2015.    Currently in Pain? Yes   Pain Score 5    Pain Location Back   Pain Orientation Lower   Pain Descriptors / Indicators Aching   Pain Type Chronic pain   Pain Onset More than a month ago   Pain Frequency Intermittent   Multiple Pain Sites No                      OT Treatments/Exercises (OP) - 01/12/16 0001    Neurological Re-education Exercises   Other Exercises 1 Patient seen this date for strengthening and ROM of BUE to assist with performance of daily tasks.  Patient performed resistive reciprocal arm movements with setting of 2.3 to 2.5, 6 minutes total with therapist in constant attendance to adjust settings and ensure grip.   Grip strength 17# on right side for 25 reps, left side 11# for 25 reps.  Shape tower in sitting with right UE all levels, LUE 3 levels but unable to perform top level.  Balloon volleyball in sitting for 20 reps for 3 sets with use of bilateral UEs. Cues for exercises for form and technique.  OT Education - 01/11/16 1645    Education provided Yes   Education Details ROM, strengthening   Person(s) Educated Patient   Methods Explanation;Demonstration;Verbal cues   Comprehension Verbal cues required;Returned demonstration;Verbalized understanding             OT Long Term Goals - 12/19/15 1611    OT LONG TERM GOAL #1   Title Patient will complete bathing with modified independence.   Baseline supervision   Time 8   Period Weeks   Status New   OT LONG TERM GOAL #2   Title Patient will complete homemaking tasks of cleaning and laundry with modified independence.   Baseline moderate to max assist   Time 8   Period Weeks   Status New   OT LONG TERM GOAL #3   Title Patient will increase bilateral UE strength to perform yard work tasks with supervision.   Baseline unable   Time 8   Period Weeks    Status New   OT LONG TERM GOAL #4   Title Patient will complete hot meal preparation with modified independence.   Baseline light meal prep currently.   Time 8   Period Weeks   Status New               Plan - 01/11/16 1646    Clinical Impression Statement Patient continues to progress with bilateral UE strengthening and ROM, right UE stronger than left.  He is working towards performing more tasks at home and has been engaged in exercises on a daily basis.  He continues to require cues at times for form and technique.    Rehab Potential Good   Clinical Impairments Affecting Rehab Potential Patient has a previous crush injury to left side in which he has limited ROM and diminished use.    OT Frequency 2x / week   OT Duration 8 weeks   OT Treatment/Interventions Self-care/ADL training;Therapeutic exercise;Patient/family education;Neuromuscular education;Manual Therapy;Balance training;Therapeutic exercises;DME and/or AE instruction;Therapeutic activities;Cognitive remediation/compensation   Consulted and Agree with Plan of Care Patient      Patient will benefit from skilled therapeutic intervention in order to improve the following deficits and impairments:  Abnormal gait, Decreased endurance, Decreased activity tolerance, Decreased knowledge of use of DME, Decreased strength, Decreased cognition, Pain, Decreased range of motion, Decreased coordination, Impaired UE functional use  Visit Diagnosis: Muscle weakness (generalized)  Other lack of coordination    Problem List Patient Active Problem List   Diagnosis Date Noted  . Confusion 10/18/2015  . Constipation 08/23/2015  . Uncontrolled type 2 diabetes mellitus with hyperglycemia, with long-term current use of insulin ([place]) 08/23/2015  . Essential hypertension 08/23/2015  . Right lower lobe pneumonia 08/23/2015  . Abdominal pain 08/21/2015  . Altered mental status 02/19/2015   Achilles Dunk, OTR/L,  CLT  Lovett,Amy 01/12/2016, 4:48 PM  [place] MAIN [facility] SERVICES [address] [place], [place], [address] Phone: [phone]   Fax:  [phone]  Name: Maurice Patterson MRN: [id number] Date of Birth: 1956/05/16

## 2016-01-14 ENCOUNTER — Ambulatory Visit: Payer: PPO

## 2016-01-14 ENCOUNTER — Ambulatory Visit: Payer: PPO | Admitting: Occupational Therapy

## 2016-01-14 DIAGNOSIS — M6281 Muscle weakness (generalized): Secondary | ICD-10-CM | POA: Diagnosis not present

## 2016-01-14 DIAGNOSIS — R278 Other lack of coordination: Secondary | ICD-10-CM

## 2016-01-14 DIAGNOSIS — R262 Difficulty in walking, not elsewhere classified: Secondary | ICD-10-CM

## 2016-01-14 NOTE — Therapy (Signed)
[place] MAIN [facility] SERVICES [address] [place], [place], [address] Phone: [phone]   Fax:  [phone]  Physical Therapy Treatment  Patient Details  Name: Maurice Patterson MRN: [id number] Date of Birth: August 11, 1956 Referring Provider: Astrid Divine  Encounter Date: 01/14/2016      PT End of Session - 01/14/16 1325    Visit Number 5   Number of Visits 13   Date for PT Re-Evaluation 01/22/16   PT Start Time 1115   PT Stop Time 1205   PT Time Calculation (min) 50 min   Equipment Utilized During Treatment Gait belt   Activity Tolerance Patient tolerated treatment well   Behavior During Therapy [facility] for tasks assessed/performed      Past Medical History  Diagnosis Date  . Seizures ([place])     staring spells  . Memory loss   . Arthritis   . Stroke ([place])   . Hypertension   . Diabetes mellitus without complication ([place])   . Malignant intraductal papillary mucinous tumor of pancreas ([place])   . Hernia of abdominal cavity   . Degenerative lumbar disc   . Acute MI ([place])   . TIA (transient ischemic attack)     Past Surgical History  Procedure Laterality Date  . Left arm metal plate    . Stent times 2  2013    Cardiac  . Left shoulder surgery    . Ercp N/A 09/12/2015    Procedure: ENDOSCOPIC RETROGRADE CHOLANGIOPANCREATOGRAPHY (ERCP);  Surgeon: Hulen Luster, MD;  Location: [facility] ENDOSCOPY;  Service: Gastroenterology;  Laterality: N/A;  . Ercp N/A 01/02/2016    Procedure: ENDOSCOPIC RETROGRADE CHOLANGIOPANCREATOGRAPHY (ERCP);  Surgeon: Hulen Luster, MD;  Location: [facility] ENDOSCOPY;  Service: Gastroenterology;  Laterality: N/A;    There were no vitals filed for this visit.      Subjective Assessment - 01/14/16 1324    Subjective pt reports he is doing well on this date. no specific concerns   Patient is accompained by: Family member   Pertinent History Extensive and complex medical history per pt report of multiple strokes, frequent falls. Pt  has caregiver present at all times and requires assistance for higher level ADLs. Pt has spent time at [facility] - unclear of length of time or how recently, following this he was seen at [facility] where per pt's son he went from completely deconditioned to ambulating I. Also reports he was issued AD but did not bring any and is not using any at home.   Limitations Walking;Standing;Lifting   How long can you walk comfortably? inside house.   Patient Stated Goals Pt would like to return to work, be able to walk better.   Currently in Pain? Yes   Pain Score 2    Pain Location Back   Pain Orientation Lower   Pain Type Chronic pain   Pain Onset More than a month ago      NMR: Nustep L 4 x 3 min no charge Fwd step over noodle and back 2x10 each leg- no Ue, cues for control and slower speed. Cues for weight shifting Side step over noodle (both feet) with UE support x 20 cues for sequencing and foot placement . Side lunge x 10 each leg PT performed visual field testing and smooth tracking. Pt peripheral vision appears intact, pt has difficulty with smooth pursuit in "H" pattern, and reports blurriness.  Toe taps on AIREX onto 4in step 2  X 20 no UE Mini squat  on AIREX 2 x 10 with max cues initially to bend knees and not flex the lumbar spine Balance stone clock reach with cues / commands to use L vs. R foot and each color of stone. Pt had difficulty with multi-step commands.  2 x  40min Scavenger hunt for cones around gym x 10 min. Pt needed min-mod cues to fine remaining 2-3/8 cones. Pt needed cues for upright gaze    pt requires CGA for safety on balance exercises                            PT Education - 01/14/16 1324    Education provided Yes   Education Details upright gaze to aid with upright posture and back pain   Person(s) Educated Patient   Methods Explanation;Tactile cues;Verbal cues   Comprehension Verbalized understanding;Need further instruction              PT Long Term Goals - 12/11/15 1252    PT LONG TERM GOAL #1   Title Pt will have identified AD and be able to use appropriately   Baseline currently pt has SPC and RW but does not know which is more appropriate   Time 6   Period Weeks   Status New   PT LONG TERM GOAL #2   Title Pt will be able to perform sit<>stand I with use of UE   Baseline CGA for sit<>stand   Time 6   Period Weeks   Status New   PT LONG TERM GOAL #3   Title Pt will improve 10MWS to .8 m/s to improve household ambulation.   Baseline .4 m/s   Time 6   Period Weeks   Status New               Plan - 01/14/16 1325    Clinical Impression Statement pt generally had more difficutly following instruction/ demonstration/ verbal cues today vs previous session. pt always has good effort throughout session. pt has difficulty at times also shifting weight to free up contralateral leg for SL activities.    Rehab Potential Good   Clinical Impairments Affecting Rehab Potential medical comorbidities/family assistance   PT Frequency 2x / week   PT Duration 6 weeks   PT Treatment/Interventions ADLs/Self Care Home Management;Passive range of motion;Therapeutic exercise;Manual techniques;Therapeutic activities;Functional mobility training;Balance training;Gait training;Stair training;Neuromuscular re-education   PT Next Visit Plan work on coordination, balance   PT Home Exercise Plan continue as given;    Consulted and Agree with Plan of Care Patient;Family member/caregiver   Family Member Consulted Son      Patient will benefit from skilled therapeutic intervention in order to improve the following deficits and impairments:  Abnormal gait, Decreased range of motion, Improper body mechanics, Pain, Difficulty walking, Impaired flexibility, Postural dysfunction, Decreased coordination, Decreased safety awareness, Decreased balance, Decreased mobility, Decreased strength  Visit Diagnosis: Muscle weakness  (generalized)  Other lack of coordination     Problem List Patient Active Problem List   Diagnosis Date Noted  . Confusion 10/18/2015  . Constipation 08/23/2015  . Uncontrolled type 2 diabetes mellitus with hyperglycemia, with long-term current use of insulin ([place]) 08/23/2015  . Essential hypertension 08/23/2015  . Right lower lobe pneumonia 08/23/2015  . Abdominal pain 08/21/2015  . Altered mental status 02/19/2015   Gorden Harms. Shey Yott, PT, DPT [phone]  Shaune Westfall 01/14/2016, 1:27 PM  [place] MAIN [facility] SERVICES [place]  [place], [place], [address] Phone: [phone]   Fax:  [phone]  Name: Maurice Patterson MRN: [id number] Date of Birth: 1956/03/24

## 2016-01-15 ENCOUNTER — Encounter: Payer: Self-pay | Admitting: Occupational Therapy

## 2016-01-15 NOTE — Therapy (Signed)
[place] MAIN [facility] SERVICES [address] [place], [place], [address] Phone: [phone]   Fax:  [phone]  Occupational Therapy Treatment  Patient Details  Name: Maurice Patterson MRN: [id number] Date of Birth: [birth year] Referring Provider: Dyanne Carrel  Encounter Date: 01/14/2016      OT End of Session - 01/15/16 2210    Visit Number 8   Number of Visits 16   Date for OT Re-Evaluation 02/13/16   Authorization Type Medicare g code 8   OT Start Time 1045   OT Stop Time 1116   OT Time Calculation (min) 31 min   Activity Tolerance Patient tolerated treatment well   Behavior During Therapy [facility] for tasks assessed/performed      Past Medical History  Diagnosis Date  . Seizures ([place])     staring spells  . Memory loss   . Arthritis   . Stroke ([place])   . Hypertension   . Diabetes mellitus without complication ([place])   . Malignant intraductal papillary mucinous tumor of pancreas ([place])   . Hernia of abdominal cavity   . Degenerative lumbar disc   . Acute MI ([place])   . TIA (transient ischemic attack)     Past Surgical History  Procedure Laterality Date  . Left arm metal plate    . Stent times 2  2013    Cardiac  . Left shoulder surgery    . Ercp N/A 09/12/2015    Procedure: ENDOSCOPIC RETROGRADE CHOLANGIOPANCREATOGRAPHY (ERCP);  Surgeon: Hulen Luster, MD;  Location: [facility] ENDOSCOPY;  Service: Gastroenterology;  Laterality: N/A;  . Ercp N/A 01/02/2016    Procedure: ENDOSCOPIC RETROGRADE CHOLANGIOPANCREATOGRAPHY (ERCP);  Surgeon: Hulen Luster, MD;  Location: [facility] ENDOSCOPY;  Service: Gastroenterology;  Laterality: N/A;    There were no vitals filed for this visit.      Subjective Assessment - 01/15/16 2204    Subjective  Patient reports he is supposed to go to the beach this weekend.  Reported frustration at the beginning of session with grandchildren in the waiting room today, his son left with the kids when therapist came to get patient from  waiting room and at that point patient was more calm.     Patient Stated Goals Patient reports he would like to drive again, he has not driven since having a pancreatic stent 09/13/2015.    Currently in Pain? Yes   Pain Score 4    Pain Location Hip   Pain Orientation Right   Pain Descriptors / Indicators Aching   Pain Type Acute pain   Pain Onset Today   Multiple Pain Sites No                      OT Treatments/Exercises (OP) - 01/15/16 2207    Neurological Re-education Exercises   Other Exercises 1 Patient seen for strengthening tasks with bilateral UEs with resistive reciprocal arm stregnthening with resistance of 2.5 to 2.7, forwards/backwards direction with therapist in constant attendance to adjust settings and ensure grip.  2# weights for bilateral shoulder flexion, alternating patterns, punches with alternating arms and biceps curl for 10 reps for 2 sets each exercise with cues.  Resistive pinch for lateral, 3 point for multi directions placing onto elevated surface.                  OT Education - 01/15/16 2210    Education provided Yes   Education Details HEP, strengthening   Person(s) Educated Patient  Methods Explanation;Demonstration;Verbal cues   Comprehension Verbal cues required;Returned demonstration;Verbalized understanding             OT Long Term Goals - 12/19/15 1611    OT LONG TERM GOAL #1   Title Patient will complete bathing with modified independence.   Baseline supervision   Time 8   Period Weeks   Status New   OT LONG TERM GOAL #2   Title Patient will complete homemaking tasks of cleaning and laundry with modified independence.   Baseline moderate to max assist   Time 8   Period Weeks   Status New   OT LONG TERM GOAL #3   Title Patient will increase bilateral UE strength to perform yard work tasks with supervision.   Baseline unable   Time 8   Period Weeks   Status New   OT LONG TERM GOAL #4   Title Patient will  complete hot meal preparation with modified independence.   Baseline light meal prep currently.   Time 8   Period Weeks   Status New               Plan - 01/15/16 2211    Clinical Impression Statement Patient distracted by family at the beginning of the session but was able to be redirected once family was gone from therapy area.  He continues to require moderate cues and demonstrates apraxia with participation in tasks.    Rehab Potential Good   OT Frequency 2x / week   OT Duration 8 weeks   OT Treatment/Interventions Self-care/ADL training;Therapeutic exercise;Patient/family education;Neuromuscular education;Manual Therapy;Balance training;Therapeutic exercises;DME and/or AE instruction;Therapeutic activities;Cognitive remediation/compensation   Consulted and Agree with Plan of Care Patient      Patient will benefit from skilled therapeutic intervention in order to improve the following deficits and impairments:  Abnormal gait, Decreased endurance, Decreased activity tolerance, Decreased knowledge of use of DME, Decreased strength, Decreased cognition, Pain, Decreased range of motion, Decreased coordination, Impaired UE functional use  Visit Diagnosis: Difficulty in walking, not elsewhere classified  Muscle weakness (generalized)  Other lack of coordination    Problem List Patient Active Problem List   Diagnosis Date Noted  . Confusion 10/18/2015  . Constipation 08/23/2015  . Uncontrolled type 2 diabetes mellitus with hyperglycemia, with long-term current use of insulin ([place]) 08/23/2015  . Essential hypertension 08/23/2015  . Right lower lobe pneumonia 08/23/2015  . Abdominal pain 08/21/2015  . Altered mental status 02/19/2015   Achilles Dunk, OTR/L, CLT  Cora Stetson 01/15/2016, 10:14 PM  [place] MAIN [facility] SERVICES [address] [place], [place], [address] Phone: [phone]   Fax:  [phone]  Name: Maurice Patterson MRN: [id number] Date of Birth: 26-Apr-1956

## 2016-01-16 ENCOUNTER — Encounter: Payer: PPO | Admitting: Occupational Therapy

## 2016-01-20 DIAGNOSIS — K903 Pancreatic steatorrhea: Secondary | ICD-10-CM | POA: Diagnosis not present

## 2016-01-20 DIAGNOSIS — K8681 Exocrine pancreatic insufficiency: Secondary | ICD-10-CM | POA: Diagnosis not present

## 2016-01-20 DIAGNOSIS — K8689 Other specified diseases of pancreas: Secondary | ICD-10-CM | POA: Diagnosis not present

## 2016-01-20 DIAGNOSIS — R918 Other nonspecific abnormal finding of lung field: Secondary | ICD-10-CM | POA: Diagnosis not present

## 2016-01-20 DIAGNOSIS — K861 Other chronic pancreatitis: Secondary | ICD-10-CM | POA: Diagnosis not present

## 2016-01-20 DIAGNOSIS — I69311 Memory deficit following cerebral infarction: Secondary | ICD-10-CM | POA: Diagnosis not present

## 2016-01-22 ENCOUNTER — Ambulatory Visit: Payer: PPO | Admitting: Physical Therapy

## 2016-01-22 ENCOUNTER — Encounter: Payer: Self-pay | Admitting: Physical Therapy

## 2016-01-22 ENCOUNTER — Ambulatory Visit: Payer: PPO | Attending: Family Medicine | Admitting: Occupational Therapy

## 2016-01-22 DIAGNOSIS — R278 Other lack of coordination: Secondary | ICD-10-CM | POA: Diagnosis not present

## 2016-01-22 DIAGNOSIS — M6281 Muscle weakness (generalized): Secondary | ICD-10-CM

## 2016-01-22 DIAGNOSIS — R262 Difficulty in walking, not elsewhere classified: Secondary | ICD-10-CM

## 2016-01-22 NOTE — Therapy (Signed)
[place] MAIN [facility] SERVICES [address] [place], [place], [address] Phone: [phone]   Fax:  [phone]  Occupational Therapy Treatment  Patient Details  Name: Maurice Patterson MRN: [id number] Date of Birth: 05-10-1956 Referring Provider: Dyanne Carrel  Encounter Date: 01/22/2016      OT End of Session - 01/22/16 1646    Visit Number 9   Number of Visits 16   Date for OT Re-Evaluation 02/13/16   Authorization Type Medicare g code 9   OT Start Time [id number]   OT Stop Time 1615   OT Time Calculation (min) 45 min   Activity Tolerance Patient tolerated treatment well   Behavior During Therapy [facility] for tasks assessed/performed      Past Medical History  Diagnosis Date  . Seizures ([place])     staring spells  . Memory loss   . Arthritis   . Stroke ([place])   . Hypertension   . Diabetes mellitus without complication ([place])   . Malignant intraductal papillary mucinous tumor of pancreas ([place])   . Hernia of abdominal cavity   . Degenerative lumbar disc   . Acute MI ([place])   . TIA (transient ischemic attack)     Past Surgical History  Procedure Laterality Date  . Left arm metal plate    . Stent times 2  2013    Cardiac  . Left shoulder surgery    . Ercp N/A 09/12/2015    Procedure: ENDOSCOPIC RETROGRADE CHOLANGIOPANCREATOGRAPHY (ERCP);  Surgeon: Hulen Luster, MD;  Location: [facility] ENDOSCOPY;  Service: Gastroenterology;  Laterality: N/A;  . Ercp N/A 01/02/2016    Procedure: ENDOSCOPIC RETROGRADE CHOLANGIOPANCREATOGRAPHY (ERCP);  Surgeon: Hulen Luster, MD;  Location: [facility] ENDOSCOPY;  Service: Gastroenterology;  Laterality: N/A;    There were no vitals filed for this visit.      Subjective Assessment - 01/22/16 1644    Subjective  Pt. reports he wants to get stronger. Pt. Reports he especially likes the UE, and grip strengthening task.    Patient is accompained by: Family member   Pertinent History Patient reports he had a headache and went [place]  [facility] [place] [facility] and there was not ventilation for the chorine and he felt sick and was transported to the hospital.  Wife reports he was given [place] and he was allergic and had a reaction at the hospital. Had shingles during his hospital stay.  Patient had a pancreatic stent placed on 09/13/2015 prior to the stroke and will have to have a follow up surgery for scar tissue on 01/02/2016.   Patient Stated Goals Patient reports he would like to drive again, he has not driven since having a pancreatic stent 09/13/2015.    Currently in Pain? No/denies   Pain Score 0-No pain         OT TREATMENT    Therapeutic Exercise:  Pt. worked on improving UE strength secondary to weakness in preparation for ADL and IADL functioning. Pt. utilized 2# dowel ex. For  Bilateral shoulder flexion, chest press, circular motion, and elbow flexion, extension. Pt. Worked with 2# dumbbell for elbow flexion, extension, forearm supination, pronation, and wrist extension. Pt. performed gross gripping with grip strengthener. Pt. worked on sustaining grip while grasping pegs and reaching at various heights for UE and grip strengthening. Pt. worked on the [place]  At level 2.5 for 8 min.  With constant monitoring of the UE's.  OT Long Term Goals - 12/19/15 1611    OT LONG TERM GOAL #1   Title Patient will complete bathing with modified independence.   Baseline supervision   Time 8   Period Weeks   Status New   OT LONG TERM GOAL #2   Title Patient will complete homemaking tasks of cleaning and laundry with modified independence.   Baseline moderate to max assist   Time 8   Period Weeks   Status New   OT LONG TERM GOAL #3   Title Patient will increase bilateral UE strength to perform yard work tasks with supervision.   Baseline unable   Time 8   Period Weeks   Status New   OT LONG TERM GOAL #4   Title Patient will complete hot meal preparation with  modified independence.   Baseline light meal prep currently.   Time 8   Period Weeks   Status New               Plan - 01/22/16 1646    Clinical Impression Statement Pt. continues to present with weakness, and continues to benefit from skilled OT services to work on improving UE strength for ADL and IADL functioning. Pt. conitnues to require moderate cues for proper ther. ex. technique, and visual demonstration.   Rehab Potential Good   Clinical Impairments Affecting Rehab Potential Patient has a previous crush injury to left side in which he has limited ROM and diminished use.    OT Frequency 2x / week   OT Duration 8 weeks   OT Treatment/Interventions Self-care/ADL training;Therapeutic exercise;Patient/family education;Neuromuscular education;Manual Therapy;Balance training;Therapeutic exercises;DME and/or AE instruction;Therapeutic activities;Cognitive remediation/compensation   Consulted and Agree with Plan of Care Patient   Family Member Consulted Son, grandchildren      Patient will benefit from skilled therapeutic intervention in order to improve the following deficits and impairments:  Abnormal gait, Decreased endurance, Decreased activity tolerance, Decreased knowledge of use of DME, Decreased strength, Decreased cognition, Pain, Decreased range of motion, Decreased coordination, Impaired UE functional use  Visit Diagnosis: Muscle weakness (generalized)    Problem List Patient Active Problem List   Diagnosis Date Noted  . Confusion 10/18/2015  . Constipation 08/23/2015  . Uncontrolled type 2 diabetes mellitus with hyperglycemia, with long-term current use of insulin ([place]) 08/23/2015  . Essential hypertension 08/23/2015  . Right lower lobe pneumonia 08/23/2015  . Abdominal pain 08/21/2015  . Altered mental status 02/19/2015   Harrel Carina, MS, OTR/L  Harrel Carina 01/22/2016, 5:13 PM  [place] MAIN [facility]  SERVICES [address] [place], [place], [address] Phone: [phone]   Fax:  [phone]  Name: Maurice Patterson MRN: [id number] Date of Birth: 02/13/1956

## 2016-01-22 NOTE — Patient Instructions (Addendum)
OT TREATMENT    Therapeutic Exercise:  Pt. worked on improving UE strength secondary to weakness in preparation for ADL and IADL functioning. Pt. utilized 2# dowel ex. For  Bilateral shoulder flexion, chest press, circular motion, and elbow flexion, extension. Pt. Worked with 2# dumbbell for elbow flexion, extension, forearm supination, pronation, and wrist extension. Pt. performed gross gripping with grip strengthener. Pt. worked on sustaining grip while grasping pegs and reaching at various heights for UE and grip strengthening. Pt. worked on the [place]  At level 2.5 for 8 min.  With constant monitoring of the UE's.

## 2016-01-23 ENCOUNTER — Ambulatory Visit: Payer: PPO | Admitting: Occupational Therapy

## 2016-01-23 ENCOUNTER — Encounter: Payer: Self-pay | Admitting: Occupational Therapy

## 2016-01-23 DIAGNOSIS — R278 Other lack of coordination: Secondary | ICD-10-CM

## 2016-01-23 DIAGNOSIS — M6281 Muscle weakness (generalized): Secondary | ICD-10-CM

## 2016-01-23 NOTE — Therapy (Signed)
[place] MAIN [facility] SERVICES [address] [place], [place], [address] Phone: [phone]   Fax:  [phone]  Occupational Therapy Treatment/Progress note 12/19/2015 to 01/23/2016  Patient Details  Name: Maurice Patterson MRN: [id number] Date of Birth: 1956/05/29 Referring Provider: Dyanne Carrel  Encounter Date: 01/23/2016      OT End of Session - 01/23/16 1054    Visit Number 10   Number of Visits 16   Date for OT Re-Evaluation 02/13/16   Authorization Type Medicare g code 10   OT Start Time 1030   OT Stop Time 1116   OT Time Calculation (min) 46 min   Activity Tolerance Patient tolerated treatment well   Behavior During Therapy [facility] for tasks assessed/performed      Past Medical History  Diagnosis Date  . Seizures ([place])     staring spells  . Memory loss   . Arthritis   . Stroke ([place])   . Hypertension   . Diabetes mellitus without complication ([place])   . Malignant intraductal papillary mucinous tumor of pancreas ([place])   . Hernia of abdominal cavity   . Degenerative lumbar disc   . Acute MI ([place])   . TIA (transient ischemic attack)     Past Surgical History  Procedure Laterality Date  . Left arm metal plate    . Stent times 2  2013    Cardiac  . Left shoulder surgery    . Ercp N/A 09/12/2015    Procedure: ENDOSCOPIC RETROGRADE CHOLANGIOPANCREATOGRAPHY (ERCP);  Surgeon: Hulen Luster, MD;  Location[facility] ENDOSCOPY;  Service: Gastroenterology;  Laterality: N/A;  . Ercp N/A 01/02/2016    Procedure: ENDOSCOPIC RETROGRADE CHOLANGIOPANCREATOGRAPHY (ERCP);  Surgeon: Hulen Luster, MD;  Location: [facility] ENDOSCOPY;  Service: Gastroenterology;  Laterality: N/A;    There were no vitals filed for this visit.      Subjective Assessment - 01/23/16 1039    Subjective  Patient reports he has cancer and there is nothing they can do now.  States, "I'll just go when I go.  I guess I should enjoy the days I have left. "     Patient Stated Goals Patient  reports he would like to drive again, he has not driven since having a pancreatic stent 09/13/2015.    Currently in Pain? No/denies   Pain Score 0-No pain                      OT Treatments/Exercises (OP) - 01/23/16 1130    ADLs   ADL Comments Reassessment of self care tasks as follows:  Patient independent with dressing skills, bathing at home.Patient able to cook eggs, grits and use the oven for cooking biscuits without diifficulty.  He has not been able to progress to yardwork.   Fine Motor Coordination   Other Fine Motor Exercises Manipulation of nuts and bolts onto elevated surface on table, cues for which nuts match which bolts, has difficulty threading some of the nuts.  Attempted grooved pegboard again however, patient has difficulty with the concept of turning the pegs to the correct position to get into the board, requires hand over hand assistance and increased cues. Reassessment of 9 hole peg test, right hand 35 secs, left 42secs.    Neurological Re-education Exercises   Other Exercises 1 Reassessment of UE as follows:  Grip strength right 69# up from 52#, left 39# up from 27#.  Lateral pinch right 18#, left 15#, 3 point pinch right 13#,  left 12#.  AROM on right for shoulder flexion 130 degrees, ABD 125 degrees, left flexion 90 degrees, ABD 107 degrees.  PROM of left shoulder 125 degrees.    Other Exercises 2 Patient seen for grip strengthening tasks right hand 17# for 25 reps, 23# for 25 reps, wall slides, finger ladder on right and left, has some difficulty with alternating fingers.                  OT Education - 01/26/2016 1054    Education provided Yes   Education Details HEP, goals, progress   Person(s) Educated Patient   Methods Explanation;Demonstration;Verbal cues   Comprehension Verbalized understanding;Returned demonstration;Verbal cues required             OT Long Term Goals - 2016-01-26 1050    OT LONG TERM GOAL #1   Title Patient will  complete bathing with modified independence.   Baseline independent   Time 8   Period Weeks   Status Achieved   OT LONG TERM GOAL #2   Title Patient will complete homemaking tasks of cleaning and laundry with modified independence.   Time 8   Period Weeks   Status Achieved   OT LONG TERM GOAL #3   Title Patient will increase bilateral UE strength to perform yard work tasks with supervision.   Baseline unable   Time 8   Period Weeks   Status On-going   OT LONG TERM GOAL #4   Title Patient will complete hot meal preparation with modified independence.   Time 8   Period Weeks   Status Achieved               Plan - 2016/01/26 1054    Clinical Impression Statement Patient has made good progress with BUE strength and coordination tasks and has been able to progress with self care and IADL activities.  He still has the most difficulty with higher level IADLs including yardwork and wife indicates continual problems with memory.  Patient will benefit from continued skilled OT to [facility] safety and independence in daily tasks.     Rehab Potential Good   OT Frequency 2x / week   OT Duration 8 weeks   OT Treatment/Interventions Self-care/ADL training;Therapeutic exercise;Patient/family education;Neuromuscular education;Manual Therapy;Balance training;Therapeutic exercises;DME and/or AE instruction;Therapeutic activities;Cognitive remediation/compensation   Consulted and Agree with Plan of Care Patient      Patient will benefit from skilled therapeutic intervention in order to improve the following deficits and impairments:  Abnormal gait, Decreased endurance, Decreased activity tolerance, Decreased knowledge of use of DME, Decreased strength, Decreased cognition, Pain, Decreased range of motion, Decreased coordination, Impaired UE functional use  Visit Diagnosis: Muscle weakness (generalized)  Other lack of coordination      G-Codes - 2016-01-26 1141    Functional Assessment Tool  Used clinical judgment, ADL assessment, 9 hole peg test, strength testing   Functional Limitation Self care   Self Care Current Status [id number]) At least 20 percent but less than 40 percent impaired, limited or restricted   Self Care Goal Status [id number]) At least 1 percent but less than 20 percent impaired, limited or restricted      Problem List Patient Active Problem List   Diagnosis Date Noted  . Confusion 10/18/2015  . Constipation 08/23/2015  . Uncontrolled type 2 diabetes mellitus with hyperglycemia, with long-term current use of insulin ([place]) 08/23/2015  . Essential hypertension 08/23/2015  . Right lower lobe pneumonia 08/23/2015  . Abdominal pain 08/21/2015  . Altered  mental status 02/19/2015   Achilles Dunk, OTR/L, CLT  Lovett,Amy 01/23/2016, 11:42 AM  [place] MAIN [facility] SERVICES [address] [place], [place], [address] Phone: [phone]   Fax:  [phone]  Name: Maurice Patterson MRN: [id number] Date of Birth: 11-24-1955

## 2016-01-23 NOTE — Therapy (Signed)
[place] MAIN [facility] SERVICES [address] [place], [place], [address] Phone: [phone]   Fax:  [phone]  Physical Therapy Treatment/Progress Note  Patient Details  Name: Maurice Patterson MRN: [id number] Date of Birth: 1956-05-29 Referring Provider: Astrid Divine  Encounter Date: 01/22/2016      PT End of Session - 01/22/16 1613    Visit Number 6   Number of Visits 21   Date for PT Re-Evaluation 02/20/16   Authorization Type Gcode 6   Authorization Time Period 10   PT Start Time 1615   PT Stop Time 1710   PT Time Calculation (min) 55 min   Equipment Utilized During Treatment Gait belt   Activity Tolerance Patient tolerated treatment well   Behavior During Therapy [facility] for tasks assessed/performed      Past Medical History  Diagnosis Date  . Seizures ([place])     staring spells  . Memory loss   . Arthritis   . Stroke ([place])   . Hypertension   . Diabetes mellitus without complication ([place])   . Malignant intraductal papillary mucinous tumor of pancreas ([place])   . Hernia of abdominal cavity   . Degenerative lumbar disc   . Acute MI ([place])   . TIA (transient ischemic attack)     Past Surgical History  Procedure Laterality Date  . Left arm metal plate    . Stent times 2  2013    Cardiac  . Left shoulder surgery    . Ercp N/A 09/12/2015    Procedure: ENDOSCOPIC RETROGRADE CHOLANGIOPANCREATOGRAPHY (ERCP);  Surgeon: Hulen Luster, MD;  Location: [facility] ENDOSCOPY;  Service: Gastroenterology;  Laterality: N/A;  . Ercp N/A 01/02/2016    Procedure: ENDOSCOPIC RETROGRADE CHOLANGIOPANCREATOGRAPHY (ERCP);  Surgeon: Hulen Luster, MD;  Location: [facility] ENDOSCOPY;  Service: Gastroenterology;  Laterality: N/A;    There were no vitals filed for this visit.      Subjective Assessment - 01/22/16 1707    Subjective Patient reports he is doing well; He denies any new falls;    Patient is accompained by: Family member   Pertinent History Extensive and complex  medical history per pt report of multiple strokes, frequent falls. Pt has caregiver present at all times and requires assistance for higher level ADLs. Pt has spent time at [facility] - unclear of length of time or how recently, following this he was seen at [facility] where per pt's son he went from completely deconditioned to ambulating I. Also reports he was issued AD but did not bring any and is not using any at home.   Limitations Walking;Standing;Lifting   How long can you walk comfortably? inside house.   Patient Stated Goals Pt would like to return to work, be able to walk better.   Currently in Pain? No/denies   Pain Onset More than a month ago            [facility] PT Assessment - 01/23/16 0001    Standardized Balance Assessment   Five times sit to stand comments  10.76 sec without HHA (slight risk for falls) improved from eval on 12/11/15 in which patient was unabe to transfer independently;   10 Meter Walk 1.15 m/s without AD (low fall risk, community ambulator) improved from eval on 12/11/15 which was 0.4 m/s   Berg Balance Test   Sit to Stand Able to stand without using hands and stabilize independently   Standing Unsupported Able to stand safely 2 minutes   Sitting with  Back Unsupported but Feet Supported on Floor or Stool Able to sit safely and securely 2 minutes   Stand to Sit Sits safely with minimal use of hands   Transfers Able to transfer safely, minor use of hands   Standing Unsupported with Eyes Closed Able to stand 10 seconds safely   Standing Ubsupported with Feet Together Able to place feet together independently and stand 1 minute safely   From Standing, Reach Forward with Outstretched Arm Can reach forward >12 cm safely (5")   From Standing Position, Pick up Object from Floor Able to pick up shoe safely and easily   From Standing Position, Turn to Look Behind Over each Shoulder Looks behind from both sides and weight shifts well   Turn 360 Degrees Able to turn 360 degrees  safely in 4 seconds or less   Standing Unsupported, Alternately Place Feet on Step/Stool Able to stand independently and safely and complete 8 steps in 20 seconds   Standing Unsupported, One Foot in Front Able to plae foot ahead of the other independently and hold 30 seconds   Standing on One Leg Tries to lift leg/unable to hold 3 seconds but remains standing independently   Total Score 51   Berg comment: 50% risk for falls;      TREATMENT: Warm up on Treadmill 1.5 mph with 2 HHA x4 min with cues to increase step length, increase erect posture, increase foot clearance with better DF at heel strike;  Instructed patient in 10 meter walk, 5 times sit<>stand, and Berg Balance assessment to address goals, see above;  Diagonal steps over narrow beam forward/backward x2 laps with max VCs and demonstration for sequencing and foot clearance Forward/backward step over narrow beam x10 with cues to increase hip flexion for better foot clearance; Side step over narrow beam x10 with cues to increase step length to allow room for both feet on one side of board;  Walking figure 8 pattern around dyna disc changing directions x3-5 min with max VCs, demonstration and tactile cues for gait pattern; patient has difficulty maintaining figure 8 pattern with changing directions due to cognitive deficits and difficulty learning new task; He also demonstrates increased foot drag; Walking in circles around dyna disc with changing directions x2-3 min with cues to continue walking around disc when changing directions; He is able to follow the leader with therapist walking in front but when changing direction, is unable to continue pattern when he is leading;  Patient continues to demonstrate increased foot drag with prolonged ambulation;  Discussed areas of concern at home; He reports still having trouble with foot drag, sometimes will have trouble with stair/curb negotiation;                       PT  Education - 01/23/16 0813    Education provided Yes   Education Details findings, HEP reinforced, balance   Person(s) Educated Patient   Methods Explanation;Demonstration;Verbal cues   Comprehension Verbalized understanding;Returned demonstration;Verbal cues required             PT Long Term Goals - 01/22/16 1639    PT LONG TERM GOAL #1   Title Pt will have identified AD and be able to use appropriately   Baseline currently pt has SPC and RW but does not know which is more appropriate   Time 6   Period Weeks   Status Achieved   PT LONG TERM GOAL #2   Title Pt will be able to  perform sit<>stand I with use of UE   Baseline CGA for sit<>stand   Time 6   Period Weeks   Status Achieved   PT LONG TERM GOAL #3   Title Pt will improve 10MWS to .8 m/s to improve household ambulation.   Baseline .4 m/s   Time 6   Period Weeks   Status Achieved   PT LONG TERM GOAL #4   Title Patient will increase six minute walk test distance to >1000 feet without foot drag for progression to community ambulator and improve gait ability   Time 4   Period Weeks   Status New   PT LONG TERM GOAL #5   Title  Patient will demonstrate an improved Berg Balance Score of > 52/56 as to demonstrate improved balance with ADLs such as sitting/standing and transfer balance and reduced fall risk.    Time 4   Period Weeks   Status New   Additional Long Term Goals   Additional Long Term Goals Yes   PT LONG TERM GOAL #6   Title Patient will increase Functional Gait Assessment score to >20/30 as to reduce fall risk and improve dynamic gait safety with community ambulation.   Time 4   Period Weeks   Status New               Plan - 01/23/16 [id number]    Clinical Impression Statement Patient has difficulty learning new task with difficulty following single step commands and demonstration. He has difficulty with continuing a walking pattern when changing directions. Patient is able to ambulate with supervision  without AD but demonstrates increased foot drag (R>L). He does demonstrate improved gait speed, transer ability and balance. However exhibits less stability with higher level balance tasks. He would benefit from additional skilled PT Intervention to improve balance/gait safety and reduce fall risk;    Rehab Potential Good   Clinical Impairments Affecting Rehab Potential medical comorbidities/family assistance   PT Frequency 2x / week   PT Duration 4 weeks   PT Treatment/Interventions ADLs/Self Care Home Management;Passive range of motion;Therapeutic exercise;Manual techniques;Therapeutic activities;Functional mobility training;Balance training;Gait training;Stair training;Neuromuscular re-education   PT Next Visit Plan work on coordination, balance   PT Home Exercise Plan continue as given;    Consulted and Agree with Plan of Care Patient;Family member/caregiver   Family Member Consulted Son      Patient will benefit from skilled therapeutic intervention in order to improve the following deficits and impairments:  Abnormal gait, Decreased range of motion, Improper body mechanics, Pain, Difficulty walking, Impaired flexibility, Postural dysfunction, Decreased coordination, Decreased safety awareness, Decreased balance, Decreased mobility, Decreased strength  Visit Diagnosis: Muscle weakness (generalized)  Other lack of coordination  Difficulty in walking, not elsewhere classified     Problem List Patient Active Problem List   Diagnosis Date Noted  . Confusion 10/18/2015  . Constipation 08/23/2015  . Uncontrolled type 2 diabetes mellitus with hyperglycemia, with long-term current use of insulin ([place]) 08/23/2015  . Essential hypertension 08/23/2015  . Right lower lobe pneumonia 08/23/2015  . Abdominal pain 08/21/2015  . Altered mental status 02/19/2015     Amreen Raczkowski PT, DPT 01/23/2016, 8:18 AM  [place] MAIN [facility] SERVICES [address] [place], [place], [address] Phone: [phone]   Fax:  [phone]  Name: Maurice Patterson MRN: [id number] Date of Birth: Oct 14, 1955

## 2016-01-26 ENCOUNTER — Ambulatory Visit: Payer: PPO | Admitting: Occupational Therapy

## 2016-01-26 ENCOUNTER — Ambulatory Visit: Payer: PPO

## 2016-01-26 DIAGNOSIS — M6281 Muscle weakness (generalized): Secondary | ICD-10-CM | POA: Diagnosis not present

## 2016-01-26 DIAGNOSIS — R278 Other lack of coordination: Secondary | ICD-10-CM

## 2016-01-26 NOTE — Therapy (Signed)
[place] MAIN [facility] SERVICES [address] [place], [place], [address] Phone: [phone]   Fax:  [phone]  Physical Therapy Treatment  Patient Details  Name: Maurice Patterson MRN: [id number] Date of Birth: [birth year] Referring Provider: Astrid Divine  Encounter Date: 01/26/2016      PT End of Session - 01/26/16 1749    Visit Number 7   Number of Visits 21   Date for PT Re-Evaluation 02/20/16   Authorization Type Gcode 7   Authorization Time Period 10   PT Start Time 1700   PT Stop Time 1745   PT Time Calculation (min) 45 min   Equipment Utilized During Treatment Gait belt   Activity Tolerance Patient tolerated treatment well   Behavior During Therapy [facility] for tasks assessed/performed      Past Medical History  Diagnosis Date  . Seizures ([place])     staring spells  . Memory loss   . Arthritis   . Stroke ([place])   . Hypertension   . Diabetes mellitus without complication ([place])   . Malignant intraductal papillary mucinous tumor of pancreas ([place])   . Hernia of abdominal cavity   . Degenerative lumbar disc   . Acute MI ([place])   . TIA (transient ischemic attack)     Past Surgical History  Procedure Laterality Date  . Left arm metal plate    . Stent times 2  2013    Cardiac  . Left shoulder surgery    . Ercp N/A 09/12/2015    Procedure: ENDOSCOPIC RETROGRADE CHOLANGIOPANCREATOGRAPHY (ERCP);  Surgeon: Hulen Luster, MD;  Location: [facility] ENDOSCOPY;  Service: Gastroenterology;  Laterality: N/A;  . Ercp N/A 01/02/2016    Procedure: ENDOSCOPIC RETROGRADE CHOLANGIOPANCREATOGRAPHY (ERCP);  Surgeon: Hulen Luster, MD;  Location: [facility] ENDOSCOPY;  Service: Gastroenterology;  Laterality: N/A;    There were no vitals filed for this visit.      Subjective Assessment - 01/26/16 1748    Subjective pt reports he is doing fine. no new concerns at this time.    Patient is accompained by: Family member   Pertinent History Extensive and complex medical  history per pt report of multiple strokes, frequent falls. Pt has caregiver present at all times and requires assistance for higher level ADLs. Pt has spent time at [facility] - unclear of length of time or how recently, following this he was seen at [facility] where per pt's son he went from completely deconditioned to ambulating I. Also reports he was issued AD but did not bring any and is not using any at home.   Limitations Walking;Standing;Lifting   How long can you walk comfortably? inside house.   Patient Stated Goals Pt would like to return to work, be able to walk better.   Currently in Pain? Yes   Pain Score 3    Pain Location Back   Pain Orientation Lower   Pain Onset More than a month ago        NMR: Sit to stand with 4lb med ball toss and throw x 30- max cues for sequencing Agility ladder x 10 min- multiple patterns- mod - max cues for sequencing  Toe taps on box 20x x 2  1 foot on AIREX 1 foot on box D1 reach for cones x 3 min each side Standing on AIRE[employer]- cone tap with commands for L vs R and colors- pt at times needs reinstruction and tactile cues Standing semi-tandem EO./ EC 30s x 2  pt requires CGA for safety on balance exercises                           PT Education - 01/26/16 1749    Education provided Yes   Education Details reinstruction of exercise   Person(s) Educated Patient   Methods Explanation   Comprehension Verbalized understanding             PT Long Term Goals - 01/22/16 1639    PT LONG TERM GOAL #1   Title Pt will have identified AD and be able to use appropriately   Baseline currently pt has SPC and RW but does not know which is more appropriate   Time 6   Period Weeks   Status Achieved   PT LONG TERM GOAL #2   Title Pt will be able to perform sit<>stand I with use of UE   Baseline CGA for sit<>stand   Time 6   Period Weeks   Status Achieved   PT LONG TERM GOAL #3   Title Pt will improve 10MWS to .8 m/s to improve  household ambulation.   Baseline .4 m/s   Time 6   Period Weeks   Status Achieved   PT LONG TERM GOAL #4   Title Patient will increase six minute walk test distance to >1000 feet without foot drag for progression to community ambulator and improve gait ability   Time 4   Period Weeks   Status New   PT LONG TERM GOAL #5   Title  Patient will demonstrate an improved Berg Balance Score of > 52/56 as to demonstrate improved balance with ADLs such as sitting/standing and transfer balance and reduced fall risk.    Time 4   Period Weeks   Status New   Additional Long Term Goals   Additional Long Term Goals Yes   PT LONG TERM GOAL #6   Title Patient will increase Functional Gait Assessment score to >20/30 as to reduce fall risk and improve dynamic gait safety with community ambulation.   Time 4   Period Weeks   Status New               Plan - 01/26/16 1750    Clinical Impression Statement pt continues to be in good spirits during exercises desptie difficulty at times. He does need mod-max cues at times for sequencing / correction of exercise. pt c/o his foot "sticking" to the floor which is likely him not shifting his weight fully to the contralateral side. pt would benefit from continued skilled PT services to maximize his mobility.    Rehab Potential Good   Clinical Impairments Affecting Rehab Potential medical comorbidities/family assistance   PT Frequency 2x / week   PT Duration 4 weeks   PT Treatment/Interventions ADLs/Self Care Home Management;Passive range of motion;Therapeutic exercise;Manual techniques;Therapeutic activities;Functional mobility training;Balance training;Gait training;Stair training;Neuromuscular re-education   PT Next Visit Plan work on coordination, balance   PT Home Exercise Plan continue as given;    Consulted and Agree with Plan of Care Patient;Family member/caregiver   Family Member Consulted Son      Patient will benefit from skilled therapeutic  intervention in order to improve the following deficits and impairments:  Abnormal gait, Decreased range of motion, Improper body mechanics, Pain, Difficulty walking, Impaired flexibility, Postural dysfunction, Decreased coordination, Decreased safety awareness, Decreased balance, Decreased mobility, Decreased strength  Visit Diagnosis: Muscle weakness (generalized)  Other lack of coordination  Problem List Patient Active Problem List   Diagnosis Date Noted  . Confusion 10/18/2015  . Constipation 08/23/2015  . Uncontrolled type 2 diabetes mellitus with hyperglycemia, with long-term current use of insulin ([place]) 08/23/2015  . Essential hypertension 08/23/2015  . Right lower lobe pneumonia 08/23/2015  . Abdominal pain 08/21/2015  . Altered mental status 02/19/2015   Gorden Harms. Mayzee Reichenbach, PT, DPT [phone]  Aasha Dina 01/26/2016, 5:52 PM  [place] MAIN [facility] SERVICES [address] [place], [place], [address] Phone: [phone]   Fax:  [phone]  Name: VALERY CIRRITO MRN: [id number] Date of Birth: [birth year]

## 2016-01-26 NOTE — Patient Instructions (Addendum)
OT TREATMENT    Neuro muscular re-education:  Pt. Worked on the Scifit to increase strength for ADL/IADLs. Pt. Was able to maintain grasp on handle with constant monitoring through distraction.   Therapeutic Exercise:  Pt. Worked on grasping, stacking, and flipping pegs. Pt. Required constant cues for stacking the the pegs, and constant cues for changing from stacking to flipping. Pt. Performance was improved when pt. started with one peg With verbal cues and visual demonstration while to board was removed. Once successful,  Pt. was then able to to flip one row on the board with max cues. Pt. Worked on the grooved pegboard, required max cues to turn the pegs in order to place them in the grooved whole.

## 2016-01-26 NOTE — Therapy (Signed)
[place] MAIN [facility] SERVICES [address] [place], [place], [address] Phone: [phone]   Fax:  [phone]  Occupational Therapy Treatment  Patient Details  Name: Maurice Patterson MRN: [id number] Date of Birth: February 08, 1956 Referring Provider: Dyanne Carrel  Encounter Date: 01/26/2016      OT End of Session - 01/26/16 1730    Visit Number 11   Number of Visits 16   Date for OT Re-Evaluation 02/13/16   Authorization Type Medicare g code1 of 10   OT Start Time 1622   OT Stop Time 1700   OT Time Calculation (min) 38 min   Activity Tolerance Patient tolerated treatment well   Behavior During Therapy [facility] for tasks assessed/performed      Past Medical History  Diagnosis Date  . Seizures ([place])     staring spells  . Memory loss   . Arthritis   . Stroke ([place])   . Hypertension   . Diabetes mellitus without complication ([place])   . Malignant intraductal papillary mucinous tumor of pancreas ([place])   . Hernia of abdominal cavity   . Degenerative lumbar disc   . Acute MI ([place])   . TIA (transient ischemic attack)     Past Surgical History  Procedure Laterality Date  . Left arm metal plate    . Stent times 2  2013    Cardiac  . Left shoulder surgery    . Ercp N/A 09/12/2015    Procedure: ENDOSCOPIC RETROGRADE CHOLANGIOPANCREATOGRAPHY (ERCP);  Surgeon: Hulen Luster, MD;  Location: [facility] ENDOSCOPY;  Service: Gastroenterology;  Laterality: N/A;  . Ercp N/A 01/02/2016    Procedure: ENDOSCOPIC RETROGRADE CHOLANGIOPANCREATOGRAPHY (ERCP);  Surgeon: Hulen Luster, MD;  Location: [facility] ENDOSCOPY;  Service: Gastroenterology;  Laterality: N/A;    There were no vitals filed for this visit.      Subjective Assessment - 01/26/16 1727    Subjective  Pt. required cues to initiate treatment session from the waiting room, and cues for direction in to the gym.   Patient is accompained by: Family member   Pertinent History Patient reports he had a headache and [facility] and there was not ventilation for the chorine and he felt sick and was transported to the hospital.  Wife reports he was given [place] and he was allergic and had a reaction at the hospital. Had shingles during his hospital stay.  Patient had a pancreatic stent placed on 09/13/2015 prior to the stroke and will have to have a follow up surgery for scar tissue on 01/02/2016.   Currently in Pain? No/denies   Pain Score 0-No pain      OT TREATMENT    Neuro muscular re-education:  Pt. Worked on the Scifit to increase strength for ADL/IADLs. Pt. Was able to maintain grasp on handle with constant monitoring through distraction.   Therapeutic Exercise:  Pt. Worked on grasping, stacking, and flipping pegs. Pt. Required constant cues for stacking the the pegs, and constant cues for changing from stacking to flipping. Pt. Performance was improved when pt. started with one peg With verbal cues and visual demonstration while to board was removed. Once successful,  Pt. was then able to to flip one row on the board with max cues. Pt. Worked on the grooved pegboard, required max cues to turn the pegs in order to place them in the grooved whole.  OT Education - 01/26/16 1729    Education provided Yes   Education Details Desired hand movements.   Person(s) Educated Patient   Methods Explanation;Demonstration;Tactile cues   Comprehension Verbalized understanding;Returned demonstration;Verbal cues required             OT Long Term Goals - 01/23/16 1050    OT LONG TERM GOAL #1   Title Patient will complete bathing with modified independence.   Baseline independent   Time 8   Period Weeks   Status Achieved   OT LONG TERM GOAL #2   Title Patient will complete homemaking tasks of cleaning and laundry with modified independence.   Time 8   Period Weeks   Status Achieved   OT LONG TERM GOAL #3   Title Patient  will increase bilateral UE strength to perform yard work tasks with supervision.   Baseline unable   Time 8   Period Weeks   Status On-going   OT LONG TERM GOAL #4   Title Patient will complete hot meal preparation with modified independence.   Time 8   Period Weeks   Status Achieved               Plan - 01/26/16 1731    Clinical Impression Statement Pt. required increased cues to day to start the treatment session, cues for direction into the gym, and very cues for the specific desired hand movements working on. Pt. required less cues with simplifying distraction in the environment.   Rehab Potential Good   OT Frequency 2x / week   OT Duration 8 weeks   OT Treatment/Interventions Self-care/ADL training;Therapeutic exercise;Patient/family education;Neuromuscular education;Manual Therapy;Balance training;Therapeutic exercises;DME and/or AE instruction;Therapeutic activities;Cognitive remediation/compensation   Consulted and Agree with Plan of Care Patient      Patient will benefit from skilled therapeutic intervention in order to improve the following deficits and impairments:  Abnormal gait, Decreased endurance, Decreased activity tolerance, Decreased knowledge of use of DME, Decreased strength, Decreased cognition, Pain, Decreased range of motion, Decreased coordination, Impaired UE functional use  Visit Diagnosis: Muscle weakness (generalized)  Other lack of coordination    Problem List Patient Active Problem List   Diagnosis Date Noted  . Confusion 10/18/2015  . Constipation 08/23/2015  . Uncontrolled type 2 diabetes mellitus with hyperglycemia, with long-term current use of insulin ([place]) 08/23/2015  . Essential hypertension 08/23/2015  . Right lower lobe pneumonia 08/23/2015  . Abdominal pain 08/21/2015  . Altered mental status 02/19/2015   Harrel Carina, MS, OTR/L   Harrel Carina 01/26/2016, 5:44 PM  [place] MAIN  [facility] SERVICES [address] [place], [place], [address] Phone: [phone]   Fax:  [phone]  Name: OLAV ZAWADA MRN: [id number] Date of Birth: [birth year]

## 2016-01-28 ENCOUNTER — Ambulatory Visit: Payer: PPO

## 2016-01-28 ENCOUNTER — Ambulatory Visit: Payer: PPO | Admitting: Occupational Therapy

## 2016-01-28 DIAGNOSIS — M6281 Muscle weakness (generalized): Secondary | ICD-10-CM

## 2016-01-28 DIAGNOSIS — R278 Other lack of coordination: Secondary | ICD-10-CM

## 2016-01-28 DIAGNOSIS — R262 Difficulty in walking, not elsewhere classified: Secondary | ICD-10-CM

## 2016-01-28 NOTE — Patient Instructions (Signed)
OT TREATMENT    There. Ex:  Pt. Utilized the SciFit for UE reciprocal motion forward and reverse with cues for hand placement. Pt. Was able to maintain grip on handles for 8 min. With constant monitoring. Pt. performed gross gripping with grip strengthener. Pt. worked on sustaining grip while grasping pegs and reaching at various heights. Pt. Required consistent cues for proper position of the grip strengthener upright when grasping pegs. Pt. required cues for placement.   Neuro muscular re-education:  Therapeutic Exercise: Pt. Attempted to perform [facility] skills training to improve speed and dexterity needed for ADL tasks. Pt. Requires cues to demonstrate grasping 1 inch sticks,  inch cylindrical collars, and  inch flat washers on the Purdue pegboard. Pt. Required extensive cues, and required the task to be simplified.  Selfcare:  Pt. Worked on kitchen mobility tasks including retrieving, and transporting items from  one surface to the next. Pt. Was able to access and fill the coffee pot, transport and fill a mug. Pt. Was able to access  Appliances with cues for safety. Pt. Was able to retrieve items from the cabinetry. Pt. Was able to work the microwave with cues.

## 2016-01-28 NOTE — Therapy (Signed)
[place] MAIN [facility] SERVICES [address] [place], [place], [address] Phone: [phone]   Fax:  [phone]  Occupational Therapy Treatment  Patient Details  Name: Maurice Patterson MRN: [id number] Date of Birth: July 28, 1956 Referring Provider: Dyanne Carrel  Encounter Date: 01/28/2016      OT End of Session - 01/28/16 1712    Visit Number 12   Number of Visits 16   Date for OT Re-Evaluation 02/13/16   Authorization Type Medicare g code 2 of 10   OT Start Time 1610   OT Stop Time 1655   OT Time Calculation (min) 45 min   Activity Tolerance Patient tolerated treatment well   Behavior During Therapy [facility] for tasks assessed/performed      Past Medical History  Diagnosis Date  . Seizures ([place])     staring spells  . Memory loss   . Arthritis   . Stroke ([place])   . Hypertension   . Diabetes mellitus without complication ([place])   . Malignant intraductal papillary mucinous tumor of pancreas ([place])   . Hernia of abdominal cavity   . Degenerative lumbar disc   . Acute MI ([place])   . TIA (transient ischemic attack)     Past Surgical History  Procedure Laterality Date  . Left arm metal plate    . Stent times 2  2013    Cardiac  . Left shoulder surgery    . Ercp N/A 09/12/2015    Procedure: ENDOSCOPIC RETROGRADE CHOLANGIOPANCREATOGRAPHY (ERCP);  Surgeon: Hulen Luster, MD;  Location: [facility] ENDOSCOPY;  Service: Gastroenterology;  Laterality: N/A;  . Ercp N/A 01/02/2016    Procedure: ENDOSCOPIC RETROGRADE CHOLANGIOPANCREATOGRAPHY (ERCP);  Surgeon: Hulen Luster, MD;  Location: [facility] ENDOSCOPY;  Service: Gastroenterology;  Laterality: N/A;    There were no vitals filed for this visit.      Subjective Assessment - 01/28/16 1709    Subjective  Pt. required cues for direction to find his way into the gym today.    Patient is accompained by: Family member   Pertinent History Patient reports he had a headache and [facility]  and there was not ventilation for the chorine and he felt sick and was transported to the hospital.  Wife reports he was given [place] and he was allergic and had a reaction at the hospital. Had shingles during his hospital stay.  Patient had a pancreatic stent placed on 09/13/2015 prior to the stroke and will have to have a follow up surgery for scar tissue on 01/02/2016.   Patient Stated Goals Patient reports he would like to drive again, he has not driven since having a pancreatic stent 09/13/2015.    Currently in Pain? No/denies   Pain Score 0-No pain  Back pain, not rated.        OT TREATMENT    There. Ex:  Pt. Utilized the SciFit for UE reciprocal motion forward and reverse with cues for hand placement. Pt. Was able to maintain grip on handles for 8 min. With constant monitoring. Pt. performed gross gripping with grip strengthener. Pt. worked on sustaining grip while grasping pegs and reaching at various heights. Pt. Required consistent cues for proper position of the grip strengthener upright when grasping pegs. Pt. required cues for placement.   Neuro muscular re-education:  Therapeutic Exercise: Pt. Attempted to perform [facility] skills training to improve speed and dexterity needed for ADL tasks. Pt. Requires cues to demonstrate grasping 1 inch  sticks,  inch cylindrical collars, and  inch flat washers on the Purdue pegboard. Pt. Required extensive cues, and required the task to be simplified.  Selfcare:  Pt. Worked on kitchen mobility tasks including retrieving, and transporting items from  one surface to the next. Pt. Was able to access and fill the coffee pot, transport and fill a mug. Pt. Was able to access  Appliances with cues for safety. Pt. Was able to retrieve items from the cabinetry. Pt. Was able to work the microwave with cues.                         OT Education - 01/28/16 1712    Education provided Yes   Person(s) Educated Patient   Methods Explanation              OT Long Term Goals - 01/23/16 1050    OT LONG TERM GOAL #1   Title Patient will complete bathing with modified independence.   Baseline independent   Time 8   Period Weeks   Status Achieved   OT LONG TERM GOAL #2   Title Patient will complete homemaking tasks of cleaning and laundry with modified independence.   Time 8   Period Weeks   Status Achieved   OT LONG TERM GOAL #3   Title Patient will increase bilateral UE strength to perform yard work tasks with supervision.   Baseline unable   Time 8   Period Weeks   Status On-going   OT LONG TERM GOAL #4   Title Patient will complete hot meal preparation with modified independence.   Time 8   Period Weeks   Status Achieved               Plan - 01/28/16 1713    Clinical Impression Statement Pt. requires continued work on improving strength, and coordination tasks. Pt. requires constant simple cues for desired [place] movements, and cues for task initiation. Pt. was able to accces appliances in the kitchen, however required cues for each step.   Rehab Potential Good   Clinical Impairments Affecting Rehab Potential Patient has a previous crush injury to left side in which he has limited ROM and diminished use.    OT Frequency 2x / week   OT Duration 8 weeks   OT Treatment/Interventions Self-care/ADL training;Therapeutic exercise;Patient/family education;Neuromuscular education;Manual Therapy;Balance training;Therapeutic exercises;DME and/or AE instruction;Therapeutic activities;Cognitive remediation/compensation   Consulted and Agree with Plan of Care Patient      Patient will benefit from skilled therapeutic intervention in order to improve the following deficits and impairments:     Visit Diagnosis: Other lack of coordination  Muscle weakness (generalized)    Problem List Patient Active Problem List   Diagnosis Date Noted  . Confusion 10/18/2015  . Constipation 08/23/2015  . Uncontrolled type 2  diabetes mellitus with hyperglycemia, with long-term current use of insulin ([place]) 08/23/2015  . Essential hypertension 08/23/2015  . Right lower lobe pneumonia 08/23/2015  . Abdominal pain 08/21/2015  . Altered mental status 02/19/2015   Harrel Carina, MS, OTR/L  Harrel Carina 01/28/2016, 5:50 PM  [place] MAIN [facility] SERVICES [address] [place], [place], [address] Phone: [phone]   Fax:  [phone]  Name: Maurice Patterson MRN: [id number] Date of Birth: 09/26/1955

## 2016-01-28 NOTE — Therapy (Signed)
[place] MAIN [facility] SERVICES [address] [place], [place], [address] Phone: [phone]   Fax:  [phone]  Physical Therapy Treatment  Patient Details  Name: Maurice Patterson MRN: [id number] Date of Birth: [birth year] Referring Provider: Astrid Divine  Encounter Date: 01/28/2016      PT End of Session - 01/28/16 1751    Visit Number 8   Number of Visits 21   Date for PT Re-Evaluation 02/20/16   Authorization Type Gcode 8   Authorization Time Period 10   PT Start Time 1700   PT Stop Time 1745   PT Time Calculation (min) 45 min   Equipment Utilized During Treatment Gait belt   Activity Tolerance Patient tolerated treatment well   Behavior During Therapy [facility] for tasks assessed/performed      Past Medical History  Diagnosis Date  . Seizures ([place])     staring spells  . Memory loss   . Arthritis   . Stroke ([place])   . Hypertension   . Diabetes mellitus without complication ([place])   . Malignant intraductal papillary mucinous tumor of pancreas ([place])   . Hernia of abdominal cavity   . Degenerative lumbar disc   . Acute MI ([place])   . TIA (transient ischemic attack)     Past Surgical History  Procedure Laterality Date  . Left arm metal plate    . Stent times 2  2013    Cardiac  . Left shoulder surgery    . Ercp N/A 09/12/2015    Procedure: ENDOSCOPIC RETROGRADE CHOLANGIOPANCREATOGRAPHY (ERCP);  Surgeon: Hulen Luster, MD;  Location: [facility] ENDOSCOPY;  Service: Gastroenterology;  Laterality: N/A;  . Ercp N/A 01/02/2016    Procedure: ENDOSCOPIC RETROGRADE CHOLANGIOPANCREATOGRAPHY (ERCP);  Surgeon: Hulen Luster, MD;  Location: [facility] ENDOSCOPY;  Service: Gastroenterology;  Laterality: N/A;    There were no vitals filed for this visit.      Subjective Assessment - 01/28/16 1704    Subjective "Oh same as usual. My hip is hurting a little bit"   Pertinent History Extensive and complex medical history per pt report of multiple strokes, frequent falls.  Pt has caregiver present at all times and requires assistance for higher level ADLs. Pt has spent time at [facility] - unclear of length of time or how recently, following this he was seen at [facility] where per pt's son he went from completely deconditioned to ambulating I. Also reports he was issued AD but did not bring any and is not using any at home.   Limitations Walking;Standing;Lifting   How long can you walk comfortably? inside house.   Patient Stated Goals Pt would like to return to work, be able to walk better.   Currently in Pain? Yes   Pain Score 6    Pain Location Hip   Pain Orientation Right;Left   Pain Descriptors / Indicators Aching         Therex: Nustep LE only, L3 x 6 min (unbilled) Resisted walking fwd/retro/bil side to side x 7.5#, 5 x each; mod verbal cues for instructions of task (mainly for side stepping, pt kept trying to walk forwards and deviated from line or progression frequently) Side stepping with ball toss in hallway x 2 laps; min verbal cues for reinstruction  pt requires CGA for safety on above exercises Neuro: Bil stance on airex pad with weight shifts reaching for random letters (dual task) x 8 mins; max verbal cues to shift weight versus hip hinge  Grapevine  x 1 lap (only cross in front); max verbal and tactile cues for leg placement and proper pattern sequence   pt requires CGA for safety on balance exercises                         PT Education - 01/28/16 1706    Education provided Yes   Education Details exercise reinstruction   Person(s) Educated Patient   Methods Explanation   Comprehension Verbalized understanding             PT Long Term Goals - 01/22/16 1639    PT LONG TERM GOAL #1   Title Pt will have identified AD and be able to use appropriately   Baseline currently pt has SPC and RW but does not know which is more appropriate   Time 6   Period Weeks   Status Achieved   PT LONG TERM GOAL #2   Title Pt will  be able to perform sit<>stand I with use of UE   Baseline CGA for sit<>stand   Time 6   Period Weeks   Status Achieved   PT LONG TERM GOAL #3   Title Pt will improve 10MWS to .8 m/s to improve household ambulation.   Baseline .4 m/s   Time 6   Period Weeks   Status Achieved   PT LONG TERM GOAL #4   Title Patient will increase six minute walk test distance to >1000 feet without foot drag for progression to community ambulator and improve gait ability   Time 4   Period Weeks   Status New   PT LONG TERM GOAL #5   Title  Patient will demonstrate an improved Berg Balance Score of > 52/56 as to demonstrate improved balance with ADLs such as sitting/standing and transfer balance and reduced fall risk.    Time 4   Period Weeks   Status New   Additional Long Term Goals   Additional Long Term Goals Yes   PT LONG TERM GOAL #6   Title Patient will increase Functional Gait Assessment score to >20/30 as to reduce fall risk and improve dynamic gait safety with community ambulation.   Time 4   Period Weeks   Status New               Plan - 01/28/16 1751    Clinical Impression Statement pt making progress in his balance and strength. he was able to participate in a dual task exercise today (weight shifting + letter finding) but demonstrated difficulty with ability to properly shift weight forward over BOS to reach letters, requiring mod verbal cues to derease hip hinge and increase forward weight shift. performed modified grapevine (cross in front only) with pt in // bars which pt deomstrated significant difficulty with staying on task, following instructions, and was unsteady on feet requiring CGA - min A  throughout. pt needs continued skilled intervention to address remaining deficits and improve overall function to decrease risk of falls.    Rehab Potential Good   Clinical Impairments Affecting Rehab Potential medical comorbidities/family assistance   PT Frequency 2x / week   PT  Duration 4 weeks   PT Treatment/Interventions ADLs/Self Care Home Management;Passive range of motion;Therapeutic exercise;Manual techniques;Therapeutic activities;Functional mobility training;Balance training;Gait training;Stair training;Neuromuscular re-education   PT Next Visit Plan work on coordination, balance   PT Home Exercise Plan continue as given;    Consulted and Agree with Plan of Care Patient;Family member/caregiver  Patient will benefit from skilled therapeutic intervention in order to improve the following deficits and impairments:  Abnormal gait, Decreased range of motion, Improper body mechanics, Pain, Difficulty walking, Impaired flexibility, Postural dysfunction, Decreased coordination, Decreased safety awareness, Decreased balance, Decreased mobility, Decreased strength  Visit Diagnosis: Other lack of coordination  Muscle weakness (generalized)  Difficulty in walking, not elsewhere classified     Problem List Patient Active Problem List   Diagnosis Date Noted  . Confusion 10/18/2015  . Constipation 08/23/2015  . Uncontrolled type 2 diabetes mellitus with hyperglycemia, with long-term current use of insulin ([place]) 08/23/2015  . Essential hypertension 08/23/2015  . Right lower lobe pneumonia 08/23/2015  . Abdominal pain 08/21/2015  . Altered mental status 02/19/2015   Gorden Harms. Abubakar Crispo, PT, DPT [phone]  Akiyah Eppolito 01/28/2016, 6:04 PM  [place] MAIN [facility] SERVICES [address] [place], [place], [address] Phone: [phone]   Fax:  [phone]  Name: Maurice Patterson MRN: [id number] Date of Birth: August 07, 1956

## 2016-02-02 ENCOUNTER — Ambulatory Visit: Payer: PPO

## 2016-02-02 ENCOUNTER — Ambulatory Visit: Payer: PPO | Admitting: Occupational Therapy

## 2016-02-02 DIAGNOSIS — R262 Difficulty in walking, not elsewhere classified: Secondary | ICD-10-CM

## 2016-02-02 DIAGNOSIS — R278 Other lack of coordination: Secondary | ICD-10-CM

## 2016-02-02 DIAGNOSIS — M6281 Muscle weakness (generalized): Secondary | ICD-10-CM

## 2016-02-02 NOTE — Therapy (Signed)
[place] MAIN [facility] SERVICES [address] [place], [place], [address] Phone: [phone]   Fax:  [phone]  Occupational Therapy Treatment  Patient Details  Name: Maurice Patterson MRN: [id number] Date of Birth: 1955/09/07 Referring Provider: Dyanne Carrel  Encounter Date: 02/02/2016      OT End of Session - 02/02/16 1731    Visit Number 13   Number of Visits 16   Date for OT Re-Evaluation 02/13/16   Authorization Type Medicare g code 3 of 10   OT Start Time [id number]   OT Stop Time 1700   OT Time Calculation (min) 45 min   Activity Tolerance Patient tolerated treatment well   Behavior During Therapy [facility] for tasks assessed/performed      Past Medical History  Diagnosis Date  . Seizures ([place])     staring spells  . Memory loss   . Arthritis   . Stroke ([place])   . Hypertension   . Diabetes mellitus without complication ([place])   . Malignant intraductal papillary mucinous tumor of pancreas ([place])   . Hernia of abdominal cavity   . Degenerative lumbar disc   . Acute MI ([place])   . TIA (transient ischemic attack)     Past Surgical History  Procedure Laterality Date  . Left arm metal plate    . Stent times 2  2013    Cardiac  . Left shoulder surgery    . Ercp N/A 09/12/2015    Procedure: ENDOSCOPIC RETROGRADE CHOLANGIOPANCREATOGRAPHY (ERCP);  Surgeon: Hulen Luster, MD;  Location: [facility] ENDOSCOPY;  Service: Gastroenterology;  Laterality: N/A;  . Ercp N/A 01/02/2016    Procedure: ENDOSCOPIC RETROGRADE CHOLANGIOPANCREATOGRAPHY (ERCP);  Surgeon: Hulen Luster, MD;  Location: [facility] ENDOSCOPY;  Service: Gastroenterology;  Laterality: N/A;    There were no vitals filed for this visit.      Subjective Assessment - 02/02/16 1730    Subjective  Pt. reports having Diarrhea requiring multiple trips to the restroom during the session.    Patient is accompained by: Family member   Pertinent History Patient reports he had a headache and went [facility] a [facility] and there was not ventilation for the chorine and he felt sick and was transported to the hospital.  Wife reports he was given [place] and he was allergic and had a reaction at the hospital. Had shingles during his hospital stay.  Patient had a pancreatic stent placed on 09/13/2015 prior to the stroke and will have to have a follow up surgery for scar tissue on 01/02/2016.   Patient Stated Goals Patient reports he would like to drive again, he has not driven since having a pancreatic stent 09/13/2015.    Currently in Pain? No/denies   Pain Score 0-No pain       OT TREATMENT    Neuro muscular re-education:   Pt. worked on screwing and unscrewing nuts and bolts. With cues for the proper hand and finger movements   Therapeutic Exercise:  Pt. Worked on the [employer] for 8 min. With cues for hand placement during the entire exercise. Pt. Was able to sustain grip with cues. Pt. performed gross gripping with grip strengthener. Pt. worked on sustaining grip while grasping pegs and reaching at various heights. Pt. Worked on pinch strengthening in the right hand for lateral/3pt pinch using resistive clips. Consistent [place] and verbal cues were required for eliciting the desired movement.  OT Education - 02/02/16 1731    Education provided Yes   Education Details Strengthening, and coordination   Person(s) Educated Patient   Methods Explanation   Comprehension Verbalized understanding             OT Long Term Goals - 01/23/16 1050    OT LONG TERM GOAL #1   Title Patient will complete bathing with modified independence.   Baseline independent   Time 8   Period Weeks   Status Achieved   OT LONG TERM GOAL #2   Title Patient will complete homemaking tasks of cleaning and laundry with modified independence.   Time 8   Period Weeks   Status Achieved   OT LONG TERM GOAL #3   Title Patient will increase bilateral UE strength  to perform yard work tasks with supervision.   Baseline unable   Time 8   Period Weeks   Status On-going   OT LONG TERM GOAL #4   Title Patient will complete hot meal preparation with modified independence.   Time 8   Period Weeks   Status Achieved               Plan - 02/02/16 1733    Clinical Impression Statement Pt. continues to require work on improving strength and coordination skills. Pt. requires simplified tasks with step-by step visual, and verbal cues.   Clinical Impairments Affecting Rehab Potential Patient has a previous crush injury to left side in which he has limited ROM and diminished use.    OT Frequency 2x / week   OT Duration 8 weeks   OT Treatment/Interventions Self-care/ADL training;Therapeutic exercise;Patient/family education;Neuromuscular education;Manual Therapy;Balance training;Therapeutic exercises;DME and/or AE instruction;Therapeutic activities;Cognitive remediation/compensation   Consulted and Agree with Plan of Care Patient   Family Member Consulted --      Patient will benefit from skilled therapeutic intervention in order to improve the following deficits and impairments:  Abnormal gait, Decreased endurance, Decreased activity tolerance, Decreased knowledge of use of DME, Decreased strength, Decreased cognition, Pain, Decreased range of motion, Decreased coordination, Impaired UE functional use  Visit Diagnosis: Muscle weakness (generalized)  Other lack of coordination    Problem List Patient Active Problem List   Diagnosis Date Noted  . Confusion 10/18/2015  . Constipation 08/23/2015  . Uncontrolled type 2 diabetes mellitus with hyperglycemia, with long-term current use of insulin ([place]) 08/23/2015  . Essential hypertension 08/23/2015  . Right lower lobe pneumonia 08/23/2015  . Abdominal pain 08/21/2015  . Altered mental status 02/19/2015   Harrel Carina, MS, OTR/L   Harrel Carina 02/02/2016, 5:49 PM  [place] MAIN [facility] SERVICES [address] [place], [place], [address] Phone: [phone]   Fax:  [phone]  Name: Maurice Patterson MRN: [id number] Date of Birth: [birth year]

## 2016-02-02 NOTE — Therapy (Signed)
[place] MAIN [facility] SERVICES [address] [place], [place], [address] Phone: [phone]   Fax:  [phone]  Physical Therapy Treatment  Patient Details  Name: Maurice Patterson MRN: [id number] Date of Birth: November 20, 1955 Referring Provider: Astrid Divine  Encounter Date: 02/02/2016      PT End of Session - 02/02/16 1748    Visit Number 9   Number of Visits 21   Date for PT Re-Evaluation 02/20/16   Authorization Type Gcode 9   Authorization Time Period 10   Equipment Utilized During Treatment Gait belt   Activity Tolerance Patient tolerated treatment well   Behavior During Therapy [facility] for tasks assessed/performed      Past Medical History  Diagnosis Date  . Seizures ([place])     staring spells  . Memory loss   . Arthritis   . Stroke ([place])   . Hypertension   . Diabetes mellitus without complication ([place])   . Malignant intraductal papillary mucinous tumor of pancreas ([place])   . Hernia of abdominal cavity   . Degenerative lumbar disc   . Acute MI ([place])   . TIA (transient ischemic attack)     Past Surgical History  Procedure Laterality Date  . Left arm metal plate    . Stent times 2  2013    Cardiac  . Left shoulder surgery    . Ercp N/A 09/12/2015    Procedure: ENDOSCOPIC RETROGRADE CHOLANGIOPANCREATOGRAPHY (ERCP);  Surgeon: Hulen Luster, MD;  Location: [facility] ENDOSCOPY;  Service: Gastroenterology;  Laterality: N/A;  . Ercp N/A 01/02/2016    Procedure: ENDOSCOPIC RETROGRADE CHOLANGIOPANCREATOGRAPHY (ERCP);  Surgeon: Hulen Luster, MD;  Location: [facility] ENDOSCOPY;  Service: Gastroenterology;  Laterality: N/A;    There were no vitals filed for this visit.      Subjective Assessment - 02/02/16 1706    Subjective "oh i'm doing alright."   Pertinent History Extensive and complex medical history per pt report of multiple strokes, frequent falls. Pt has caregiver present at all times and requires assistance for higher level ADLs. Pt has spent time at  [facility] - unclear of length of time or how recently, following this he was seen at [facility] where per pt's son he went from completely deconditioned to ambulating I. Also reports he was issued AD but did not bring any and is not using any at home.   Limitations Walking;Standing;Lifting   How long can you walk comfortably? inside house.   Patient Stated Goals Pt would like to return to work, be able to walk better.   Currently in Pain? No/denies      Therex: nustep x 5 min L3 LE only (unbilled) wall slides 3 x 15 with mod - max verbal cues for proper technique; CGA   NMR: HHA grapevine in hallway to minimize distraction x 2 laps; max verbal and tactile cues for foot placement; min A for balance and increased time to complete task conetaps on airex in random order (PT called out random foot and color) x 8 min Balance on 1/2 foam roll x 6 min; min A for LOB; demonstrated posterior weight shift and required UE support frequently to maintain balance           PT Education - 02/02/16 1707    Education provided Yes   Education Details exercise technique and reinstruction   Person(s) Educated Patient   Methods Explanation;Demonstration   Comprehension Verbalized understanding  PT Long Term Goals - 01/22/16 1639    PT LONG TERM GOAL #1   Title Pt will have identified AD and be able to use appropriately   Baseline currently pt has SPC and RW but does not know which is more appropriate   Time 6   Period Weeks   Status Achieved   PT LONG TERM GOAL #2   Title Pt will be able to perform sit<>stand I with use of UE   Baseline CGA for sit<>stand   Time 6   Period Weeks   Status Achieved   PT LONG TERM GOAL #3   Title Pt will improve 10MWS to .8 m/s to improve household ambulation.   Baseline .4 m/s   Time 6   Period Weeks   Status Achieved   PT LONG TERM GOAL #4   Title Patient will increase six minute walk test distance to >1000 feet without foot drag for  progression to community ambulator and improve gait ability   Time 4   Period Weeks   Status New   PT LONG TERM GOAL #5   Title  Patient will demonstrate an improved Berg Balance Score of > 52/56 as to demonstrate improved balance with ADLs such as sitting/standing and transfer balance and reduced fall risk.    Time 4   Period Weeks   Status New   Additional Long Term Goals   Additional Long Term Goals Yes   PT LONG TERM GOAL #6   Title Patient will increase Functional Gait Assessment score to >20/30 as to reduce fall risk and improve dynamic gait safety with community ambulation.   Time 4   Period Weeks   Status New               Plan - 02/02/16 1748    Clinical Impression Statement pt continues to make progress towards goals. he still demonstrates difficulty with following instructions and needs mod-max of verbal and tactile cues for almost all exercises. performed grapevine with pt again today but with HHA and in hallway to minimize distractions; he was able to perform foot sequence with some improvement compared to last session, however he still has difficulty with coordination and balance throughout. his ankle strategies during dynamic balance exercises are still diminished and pt tends to lose his balance posteriorly, requiring mod verbal and min tactile cues for proper weight shifting. Pt needs continued skilled PT intervention to maximize independence and decrease risk of falls.   Rehab Potential Good   Clinical Impairments Affecting Rehab Potential medical comorbidities/family assistance   PT Frequency 2x / week   PT Duration 4 weeks   PT Treatment/Interventions ADLs/Self Care Home Management;Passive range of motion;Therapeutic exercise;Manual techniques;Therapeutic activities;Functional mobility training;Balance training;Gait training;Stair training;Neuromuscular re-education   PT Next Visit Plan work on coordination, balance   PT Home Exercise Plan continue as given;     Consulted and Agree with Plan of Care Patient;Family member/caregiver      Patient will benefit from skilled therapeutic intervention in order to improve the following deficits and impairments:  Abnormal gait, Decreased range of motion, Improper body mechanics, Pain, Difficulty walking, Impaired flexibility, Postural dysfunction, Decreased coordination, Decreased safety awareness, Decreased balance, Decreased mobility, Decreased strength  Visit Diagnosis: Muscle weakness (generalized)  Other lack of coordination  Difficulty in walking, not elsewhere classified     Problem List Patient Active Problem List   Diagnosis Date Noted  . Confusion 10/18/2015  . Constipation 08/23/2015  . Uncontrolled type 2 diabetes mellitus  with hyperglycemia, with long-term current use of insulin ([place]) 08/23/2015  . Essential hypertension 08/23/2015  . Right lower lobe pneumonia 08/23/2015  . Abdominal pain 08/21/2015  . Altered mental status 02/19/2015   Geraldine Solar, SPT This entire session was performed under direct supervision and direction of a licensed therapist/therapist assistant . I have personally read, edited and approve of the note as written. Gorden Harms. Tortorici, PT, DPT [phone]  Tortorici,Ashley 02/02/2016, 6:38 PM  [place] MAIN [facility] SERVICES [address] [place], [place], [address] Phone: [phone]   Fax:  [phone]  Name: Maurice Patterson MRN: [id number] Date of Birth: 1956/03/03

## 2016-02-02 NOTE — Patient Instructions (Signed)
OT TREATMENT    Neuro muscular re-education:   Pt. worked on screwing and unscrewing nuts and bolts. With cues for the proper hand and finger movements   Therapeutic Exercise:  Pt. Worked on the [employer] for 8 min. With cues for hand placement during the entire exercise. Pt. Was able to sustain grip with cues. Pt. performed gross gripping with grip strengthener. Pt. worked on sustaining grip while grasping pegs and reaching at various heights. Pt. Worked on pinch strengthening in the right hand for lateral/3pt pinch using resistive clips. Consistent Tactlie and verbal cues were required for eliciting the desired movement.

## 2016-02-04 ENCOUNTER — Ambulatory Visit: Payer: PPO

## 2016-02-04 ENCOUNTER — Ambulatory Visit: Payer: PPO | Admitting: Occupational Therapy

## 2016-02-04 DIAGNOSIS — M6281 Muscle weakness (generalized): Secondary | ICD-10-CM | POA: Diagnosis not present

## 2016-02-04 DIAGNOSIS — R278 Other lack of coordination: Secondary | ICD-10-CM

## 2016-02-04 DIAGNOSIS — R262 Difficulty in walking, not elsewhere classified: Secondary | ICD-10-CM

## 2016-02-04 NOTE — Therapy (Signed)
[place] MAIN [facility] SERVICES [address] [place], [place], [address] Phone: [phone]   Fax:  [phone]  Occupational Therapy Treatment  Patient Details  Name: Maurice Patterson MRN: [id number] Date of Birth: 10-Jan-1956 Referring Provider: Dyanne Carrel  Encounter Date: 02/04/2016      OT End of Session - 02/04/16 1707    Visit Number 14   Number of Visits 16   Date for OT Re-Evaluation 02/13/16   Authorization Type Medicare g code 4 of 10   OT Start Time [id number]   OT Stop Time 1700   OT Time Calculation (min) 45 min   Activity Tolerance Patient tolerated treatment well   Behavior During Therapy [facility] for tasks assessed/performed      Past Medical History  Diagnosis Date  . Seizures ([place])     staring spells  . Memory loss   . Arthritis   . Stroke ([place])   . Hypertension   . Diabetes mellitus without complication ([place])   . Malignant intraductal papillary mucinous tumor of pancreas ([place])   . Hernia of abdominal cavity   . Degenerative lumbar disc   . Acute MI ([place])   . TIA (transient ischemic attack)     Past Surgical History  Procedure Laterality Date  . Left arm metal plate    . Stent times 2  2013    Cardiac  . Left shoulder surgery    . Ercp N/A 09/12/2015    Procedure: ENDOSCOPIC RETROGRADE CHOLANGIOPANCREATOGRAPHY (ERCP);  Surgeon: Hulen Luster, MD;  Location: [facility] ENDOSCOPY;  Service: Gastroenterology;  Laterality: N/A;  . Ercp N/A 01/02/2016    Procedure: ENDOSCOPIC RETROGRADE CHOLANGIOPANCREATOGRAPHY (ERCP);  Surgeon: Hulen Luster, MD;  Location: [facility] ENDOSCOPY;  Service: Gastroenterology;  Laterality: N/A;    There were no vitals filed for this visit.      Subjective Assessment - 02/04/16 1706    Subjective  Pt. reports his Diarrhea is better today.   Patient is accompained by: Family member   Pertinent History Patient reports he had a headache and went [facility] a [facility] and there was not  ventilation for the chorine and he felt sick and was transported to the hospital.  Wife reports he was given [place] and he was allergic and had a reaction at the hospital. Had shingles during his hospital stay.  Patient had a pancreatic stent placed on 09/13/2015 prior to the stroke and will have to have a follow up surgery for scar tissue on 01/02/2016.   Patient Stated Goals Patient reports he would like to drive again, he has not driven since having a pancreatic stent 09/13/2015.    Currently in Pain? No/denies   Pain Score 0-No pain          OT TREATMENT    Neuro muscular re-education:   Pt. worked on grasping, and flipping 1" discs. Emphasis was placed on grasping and turning the discs using his thumb, 2nd, and 3rd digits. Pt. Requires consistent verbal cues and visual demonstration to complete.  Therapeutic Exercise:  Pt. Completed the SCiFit for UE strengthening. Pt. Completed 8 min. of forward and reverse motion. With constant verbal and visual cues cues to keep Bilateral hands on the handles. Pt. performed gross gripping with grip strengthener. Pt. worked on sustaining grip while grasping pegs and reaching at various heights. Pt. Requires visual and verbal cues for hand position and maintaining grip in a vertical position. Pt. Worked on lat., 3pt., and  2pt. pinch strengthening in the left hand for lateral pinch using yellow and red resistive clips. Visual demonstration, constant Tactile and verbal cues were required for eliciting the desired pinch movements.                      OT Education - 02/04/16 1706    Education provided Yes   Education Details Strengthening, and coordination   Person(s) Educated Patient   Methods Explanation   Comprehension Verbalized understanding             OT Long Term Goals - 01/23/16 1050    OT LONG TERM GOAL #1   Title Patient will complete bathing with modified independence.   Baseline independent   Time 8   Period  Weeks   Status Achieved   OT LONG TERM GOAL #2   Title Patient will complete homemaking tasks of cleaning and laundry with modified independence.   Time 8   Period Weeks   Status Achieved   OT LONG TERM GOAL #3   Title Patient will increase bilateral UE strength to perform yard work tasks with supervision.   Baseline unable   Time 8   Period Weeks   Status On-going   OT LONG TERM GOAL #4   Title Patient will complete hot meal preparation with modified independence.   Time 8   Period Weeks   Status Achieved               Plan - 02/04/16 1707    Clinical Impression Statement Pt. continues to focus on improving strength to assist with completing IADL tasks. Pt. requires tasks to be simplified in order to achieve the desired hand movements, and positions. Pt. continues to benefit from skilled OT services to work on improving UE strength, and coordination for improved ADL and IADL functioning.      Rehab Potential Good   OT Frequency 2x / week   OT Duration 8 weeks   OT Treatment/Interventions Self-care/ADL training;Therapeutic exercise;Patient/family education;Neuromuscular education;Manual Therapy;Balance training;Therapeutic exercises;DME and/or AE instruction;Therapeutic activities;Cognitive remediation/compensation   Consulted and Agree with Plan of Care Patient      Patient will benefit from skilled therapeutic intervention in order to improve the following deficits and impairments:  Abnormal gait, Decreased endurance, Decreased activity tolerance, Decreased knowledge of use of DME, Decreased strength, Decreased cognition, Pain, Decreased range of motion, Decreased coordination, Impaired UE functional use  Visit Diagnosis: Other lack of coordination  Muscle weakness (generalized)    Problem List Patient Active Problem List   Diagnosis Date Noted  . Confusion 10/18/2015  . Constipation 08/23/2015  . Uncontrolled type 2 diabetes mellitus with hyperglycemia, with  long-term current use of insulin ([place]) 08/23/2015  . Essential hypertension 08/23/2015  . Right lower lobe pneumonia 08/23/2015  . Abdominal pain 08/21/2015  . Altered mental status 02/19/2015   Harrel Carina, MS, OTR/L  Harrel Carina 02/04/2016, 5:28 PM  [place] MAIN [facility] SERVICES [address] [place], [place], [address] Phone: [phone]   Fax:  [phone]  Name: Maurice Patterson MRN: [id number] Date of Birth: Jan 09, 1956

## 2016-02-04 NOTE — Patient Instructions (Signed)
OT TREATMENT    Neuro muscular re-education:   Pt. worked on grasping, and flipping 1" discs. Emphasis was placed on grasping and turning the discs using his thumb, 2nd, and 3rd digits. Pt. Requires consistent verbal cues and visual demonstration to complete.  Therapeutic Exercise:  Pt. Completed the SCiFit for UE strengthening. Pt. Completed 8 min. of forward and reverse motion. With constant verbal and visual cues cues to keep Bilateral hands on the handles. Pt. performed gross gripping with grip strengthener. Pt. worked on sustaining grip while grasping pegs and reaching at various heights. Pt. Requires visual and verbal cues for hand position and maintaining grip in a vertical position. Pt. Worked on lat., 3pt., and 2pt. pinch strengthening in the left hand for lateral pinch using yellow and red resistive clips. Visual demonstration, constant Tactile and verbal cues were required for eliciting the desired pinch movements.

## 2016-02-04 NOTE — Therapy (Signed)
[place] MAIN [facility] SERVICES [address] [place], [place], [address] Phone: [phone]   Fax:  [phone]  Physical Therapy Treatment  Patient Details  Name: Maurice Patterson MRN: [id number] Date of Birth: 1956-07-05 Referring Provider: Astrid Divine  Encounter Date: 02/04/2016      PT End of Session - 02/04/16 1749    Visit Number 10   Number of Visits 21   Date for PT Re-Evaluation 02/20/16   Authorization Type 1   Authorization Time Period 10   PT Start Time 1705   PT Stop Time 1745   PT Time Calculation (min) 40 min   Equipment Utilized During Treatment Gait belt   Activity Tolerance Patient tolerated treatment well   Behavior During Therapy [facility] for tasks assessed/performed      Past Medical History  Diagnosis Date  . Seizures ([place])     staring spells  . Memory loss   . Arthritis   . Stroke ([place])   . Hypertension   . Diabetes mellitus without complication ([place])   . Malignant intraductal papillary mucinous tumor of pancreas ([place])   . Hernia of abdominal cavity   . Degenerative lumbar disc   . Acute MI ([place])   . TIA (transient ischemic attack)     Past Surgical History  Procedure Laterality Date  . Left arm metal plate    . Stent times 2  2013    Cardiac  . Left shoulder surgery    . Ercp N/A 09/12/2015    Procedure: ENDOSCOPIC RETROGRADE CHOLANGIOPANCREATOGRAPHY (ERCP);  Surgeon: Hulen Luster, MD;  Location: [facility] ENDOSCOPY;  Service: Gastroenterology;  Laterality: N/A;  . Ercp N/A 01/02/2016    Procedure: ENDOSCOPIC RETROGRADE CHOLANGIOPANCREATOGRAPHY (ERCP);  Surgeon: Hulen Luster, MD;  Location: [facility] ENDOSCOPY;  Service: Gastroenterology;  Laterality: N/A;    There were no vitals filed for this visit.      Subjective Assessment - 02/04/16 1715    Subjective pt reports doing alright. states he has mild LBP today.    Currently in Pain? Yes   Pain Score 1    Pain Location Back   Pain Orientation Right;Left   Pain  Descriptors / Indicators Aching       THEREX Nustep LE and UE L8 x 8 min (unbilled)  NMR Alternating staggered stance with fwd foot on dynadisc with perturbations in all directions x 8 min; CGA - mod A throughout for balance. Pt had most difficulty with perturbations the R Core stability and balance on big theraball with alternating march 2 x 10 each; mod-max A for balance and max verbal cues for maintaining proper seated position on ball Fwd tandem walking on airex pad x 5 laps; mod difficulty with sequencing movements; CGA throughout Attempted retro tandem walking on airex pad but pt had too much difficulty sequencing and balance loss Attempted side stepping on airex pad x 1 lap; pt had max difficulty with sequencing         PT Education - 02/04/16 1800    Education provided Yes   Education Details exercise reinstruction and technique   Person(s) Educated Patient   Methods Explanation   Comprehension Verbalized understanding             PT Long Term Goals - 02/04/16 1846    PT LONG TERM GOAL #1   Title Pt will have identified AD and be able to use appropriately   Baseline currently pt has SPC and RW but does not know  which is more appropriate   Time 6   Period Weeks   Status Achieved   PT LONG TERM GOAL #2   Title Pt will be able to perform sit<>stand I with use of UE   Baseline CGA for sit<>stand   Time 6   Period Weeks   Status Achieved   PT LONG TERM GOAL #3   Title Pt will improve 10MWS to .8 m/s to improve household ambulation.   Baseline .4 m/s   Time 6   Period Weeks   Status Achieved   PT LONG TERM GOAL #4   Title Patient will increase six minute walk test distance to >1000 feet without foot drag for progression to community ambulator and improve gait ability   Time 4   Period Weeks   Status On-going   PT LONG TERM GOAL #5   Title  Patient will demonstrate an improved Berg Balance Score of > 52/56 as to demonstrate improved balance with ADLs such  as sitting/standing and transfer balance and reduced fall risk.    Time 4   Period Weeks   Status On-going   PT LONG TERM GOAL #6   Title Patient will increase Functional Gait Assessment score to >20/30 as to reduce fall risk and improve dynamic gait safety with community ambulation.   Time 4   Period Weeks   Status On-going               Plan - March 03, 2016 1750    Clinical Impression Statement pt continues to make progress towards goals. He still has difficulty with coordination and sequencing movements of BLE (L>R) as evidenced by his difficulty with tandem and side stepping on airex pad. His balance is improving however he still has difficulty with dynamic balance exercises. pt needs continued skilled PT intervention to address remaining deficits in order to improve overall function and decrease risk of falls.    Rehab Potential Good   Clinical Impairments Affecting Rehab Potential medical comorbidities/family assistance   PT Frequency 2x / week   PT Duration 4 weeks   PT Treatment/Interventions ADLs/Self Care Home Management;Passive range of motion;Therapeutic exercise;Manual techniques;Therapeutic activities;Functional mobility training;Balance training;Gait training;Stair training;Neuromuscular re-education   PT Next Visit Plan work on coordination, balance   PT Home Exercise Plan continue as given;    Consulted and Agree with Plan of Care Patient;Family member/caregiver      Patient will benefit from skilled therapeutic intervention in order to improve the following deficits and impairments:  Abnormal gait, Decreased range of motion, Improper body mechanics, Pain, Difficulty walking, Impaired flexibility, Postural dysfunction, Decreased coordination, Decreased safety awareness, Decreased balance, Decreased mobility, Decreased strength  Visit Diagnosis: Other lack of coordination  Difficulty in walking, not elsewhere classified       G-Codes - 03/03/2016 1847    Functional  Limitation Mobility: Walking and moving around   Mobility: Walking and Moving Around Current Status [phone]) At least 40 percent but less than 60 percent impaired, limited or restricted   Mobility: Walking and Moving Around Goal Status [phone]) At least 20 percent but less than 40 percent impaired, limited or restricted      Problem List Patient Active Problem List   Diagnosis Date Noted  . Confusion 10/18/2015  . Constipation 08/23/2015  . Uncontrolled type 2 diabetes mellitus with hyperglycemia, with long-term current use of insulin ([place]) 08/23/2015  . Essential hypertension 08/23/2015  . Right lower lobe pneumonia 08/23/2015  . Abdominal pain 08/21/2015  . Altered mental status 02/19/2015  Geraldine Solar, SPT This entire session was performed under direct supervision and direction of a licensed therapist/therapist assistant . I have personally read, edited and approve of the note as written.  Gorden Harms. Tortorici, PT, DPT [phone]  Tortorici,Ashley 02/04/2016, 6:48 PM  [place] MAIN [facility] SERVICES [address] [place], [place], [address] Phone: [phone]   Fax:  [phone]  Name: Maurice Patterson MRN: [id number] Date of Birth: Apr 11, 1956

## 2016-02-09 ENCOUNTER — Ambulatory Visit: Payer: PPO

## 2016-02-09 ENCOUNTER — Ambulatory Visit: Payer: PPO | Admitting: Occupational Therapy

## 2016-02-09 VITALS — BP 138/78 | HR 78

## 2016-02-09 DIAGNOSIS — R262 Difficulty in walking, not elsewhere classified: Secondary | ICD-10-CM

## 2016-02-09 DIAGNOSIS — R278 Other lack of coordination: Secondary | ICD-10-CM

## 2016-02-09 DIAGNOSIS — M6281 Muscle weakness (generalized): Secondary | ICD-10-CM | POA: Diagnosis not present

## 2016-02-09 NOTE — Therapy (Signed)
[place] MAIN [facility] SERVICES [address] [place], [place], [address] Phone: [phone]   Fax:  [phone]  Occupational Therapy Treatment  Patient Details  Name: Maurice Patterson MRN: [id number] Date of Birth: [birth year] Referring Provider: Dyanne Carrel  Encounter Date: 02/09/2016      OT End of Session - 02/09/16 1723    Activity Tolerance Patient tolerated treatment well   Behavior During Therapy [facility] for tasks assessed/performed      Past Medical History  Diagnosis Date  . Seizures ([place])     staring spells  . Memory loss   . Arthritis   . Stroke ([place])   . Hypertension   . Diabetes mellitus without complication ([place])   . Malignant intraductal papillary mucinous tumor of pancreas ([place])   . Hernia of abdominal cavity   . Degenerative lumbar disc   . Acute MI ([place])   . TIA (transient ischemic attack)     Past Surgical History  Procedure Laterality Date  . Left arm metal plate    . Stent times 2  2013    Cardiac  . Left shoulder surgery    . Ercp N/A 09/12/2015    Procedure: ENDOSCOPIC RETROGRADE CHOLANGIOPANCREATOGRAPHY (ERCP);  Surgeon: Hulen Luster, MD;  Location: [facility] ENDOSCOPY;  Service: Gastroenterology;  Laterality: N/A;  . Ercp N/A 01/02/2016    Procedure: ENDOSCOPIC RETROGRADE CHOLANGIOPANCREATOGRAPHY (ERCP);  Surgeon: Hulen Luster, MD;  Location: [facility] ENDOSCOPY;  Service: Gastroenterology;  Laterality: N/A;    There were no vitals filed for this visit.      Subjective Assessment - 02/09/16 1721    Subjective  Pt. reports he tries to exercise at home.   Patient is accompained by: Family member   Pertinent History Patient reports he had a headache and [facility] and there was not ventilation for the chorine and he felt sick and was transported to the hospital.  Wife reports he was given [place] and he was allergic and had a reaction at the hospital. Had shingles during his hospital stay.   Patient had a pancreatic stent placed on 09/13/2015 prior to the stroke and will have to have a follow up surgery for scar tissue on 01/02/2016.   Currently in Pain? No/denies   Pain Score 0-No pain         OT TREATMENT    Neuro muscular re-education:  Pt. Worked on grasping, flipping, stacking 1" [place] style discs. Pt. Requires constant visual and verbal cues for the desired hand movements needed to complete the task.   Therapeutic Exercise:  Pt. Worked on the [employer] for UE strengthening and reciprocal movement. Pt. Required cues for hand placement, and constant monitoring of both hands on the handles. Pt. Worked out for 8 min. Pt. performed gross gripping with grip strengthener. Pt. worked on sustaining grip while grasping pegs and reaching at various heights. Pt. Required cues for the upright position of the gripper when grasping the large pegs. Pt. Worked on lateral and 3 pt. pinch strengthening in the left hand for lateral pinch using red and green resistive clips.Tactlie and verbal cues were required for eliciting the desired movement.                          OT Education - 02/09/16 1723    Education provided Yes   Person(s) Educated Patient   Methods Explanation   Comprehension Verbalized understanding  OT Long Term Goals - 01/23/16 1050    OT LONG TERM GOAL #1   Title Patient will complete bathing with modified independence.   Baseline independent   Time 8   Period Weeks   Status Achieved   OT LONG TERM GOAL #2   Title Patient will complete homemaking tasks of cleaning and laundry with modified independence.   Time 8   Period Weeks   Status Achieved   OT LONG TERM GOAL #3   Title Patient will increase bilateral UE strength to perform yard work tasks with supervision.   Baseline unable   Time 8   Period Weeks   Status On-going   OT LONG TERM GOAL #4   Title Patient will complete hot meal preparation with modified  independence.   Time 8   Period Weeks   Status Achieved               Plan - 02/09/16 1709    Clinical Impression Statement Pt. continues to work on improving UE strength, and [facility] skills for ADL functioning. Pt. continues to require frequent consistent verbal cues for the desired hand movements.Pt. constant cues for redirection.   Rehab Potential Good   Clinical Impairments Affecting Rehab Potential Patient has a previous crush injury to left side in which he has limited ROM and diminished use.    OT Frequency 2x / week   OT Duration 8 weeks   OT Treatment/Interventions Self-care/ADL training;Therapeutic exercise;Patient/family education;Neuromuscular education;Manual Therapy;Balance training;Therapeutic exercises;DME and/or AE instruction;Therapeutic activities;Cognitive remediation/compensation   Consulted and Agree with Plan of Care Patient   Family Member Consulted Son, grandchildren      Patient will benefit from skilled therapeutic intervention in order to improve the following deficits and impairments:  Abnormal gait, Decreased endurance, Decreased activity tolerance, Decreased knowledge of use of DME, Decreased strength, Decreased cognition, Pain, Decreased range of motion, Decreased coordination, Impaired UE functional use  Visit Diagnosis: Other lack of coordination  Muscle weakness (generalized)    Problem List Patient Active Problem List   Diagnosis Date Noted  . Confusion 10/18/2015  . Constipation 08/23/2015  . Uncontrolled type 2 diabetes mellitus with hyperglycemia, with long-term current use of insulin ([place]) 08/23/2015  . Essential hypertension 08/23/2015  . Right lower lobe pneumonia 08/23/2015  . Abdominal pain 08/21/2015  . Altered mental status 02/19/2015   Harrel Carina, MS, OTR/L  Harrel Carina 02/09/2016, 5:35 PM  [place] MAIN [facility] SERVICES [address] [place], [place], [address] Phone:  [phone]   Fax:  [phone]  Name: Maurice Patterson MRN: [id number] Date of Birth: Feb 24, 1956

## 2016-02-09 NOTE — Patient Instructions (Signed)
OT TREATMENT    Neuro muscular re-education:  Pt. Worked on grasping, flipping, stacking 1" [place] style discs. Pt. Requires constant visual, verbal, for the desired hand movements needed to complete the task.   Therapeutic Exercise:  Pt. Worked on the [employer] for UE strengthening and reciprocal movement. Pt. Required cues for hand placement, and constant monitoring of both hands on the handles. Pt. Worked out for 8 min. Pt. performed gross gripping with grip strengthener. Pt. worked on sustaining grip while grasping pegs and reaching at various heights. Pt. Required cues for the upright position of the gripper when grasping the large pegs. Pt. Worked on lateral and 3 pt. pinch strengthening in the left hand for lateral pinch using red and green resistive clips.Tactlie and verbal cues were required for eliciting the desired movement.

## 2016-02-10 NOTE — Therapy (Signed)
[place] MAIN [facility] SERVICES [address] [place], [place], [address] Phone: [phone]   Fax:  [phone]  Physical Therapy Treatment/progress note  Patient Details  Name: Maurice Patterson MRN: [id number] Date of Birth: 1956-06-14 Referring Provider: Astrid Divine  Encounter Date: 02/09/2016      PT End of Session - 02/10/16 0840    Visit Number 11   Number of Visits 21   Date for PT Re-Evaluation 03/08/16   Authorization Type 2/10   Authorization Time Period 10   PT Start Time 1700   PT Stop Time 1745   PT Time Calculation (min) 45 min   Equipment Utilized During Treatment Gait belt   Activity Tolerance Patient tolerated treatment well   Behavior During Therapy [facility] for tasks assessed/performed      Past Medical History  Diagnosis Date  . Seizures ([place])     staring spells  . Memory loss   . Arthritis   . Stroke ([place])   . Hypertension   . Diabetes mellitus without complication ([place])   . Malignant intraductal papillary mucinous tumor of pancreas ([place])   . Hernia of abdominal cavity   . Degenerative lumbar disc   . Acute MI ([place])   . TIA (transient ischemic attack)     Past Surgical History  Procedure Laterality Date  . Left arm metal plate    . Stent times 2  2013    Cardiac  . Left shoulder surgery    . Ercp N/A 09/12/2015    Procedure: ENDOSCOPIC RETROGRADE CHOLANGIOPANCREATOGRAPHY (ERCP);  Surgeon: Hulen Luster, MD;  Location: [facility] ENDOSCOPY;  Service: Gastroenterology;  Laterality: N/A;  . Ercp N/A 01/02/2016    Procedure: ENDOSCOPIC RETROGRADE CHOLANGIOPANCREATOGRAPHY (ERCP);  Surgeon: Hulen Luster, MD;  Location: [facility] ENDOSCOPY;  Service: Gastroenterology;  Laterality: N/A;    Filed Vitals:   02/10/16 0839  BP: 138/78  Pulse: 78        Subjective Assessment - 02/10/16 0840    Subjective pt reports he enjoys coming to PT becuase its challenging    Patient is accompained by: Family member   Pertinent History  Extensive and complex medical history per pt report of multiple strokes, frequent falls. Pt has caregiver present at all times and requires assistance for higher level ADLs. Pt has spent time at [facility] - unclear of length of time or how recently, following this he was seen at [facility] where per pt's son he went from completely deconditioned to ambulating I. Also reports he was issued AD but did not bring any and is not using any at home.   Limitations Walking;Standing;Lifting   How long can you walk comfortably? inside house.   Patient Stated Goals Pt would like to return to work, be able to walk better.   Currently in Pain? Yes   Pain Score 2    Pain Location Back   Pain Onset More than a month ago     Therex PT reassessed goals and outcome measures as follows      [facility] PT Assessment - 02/10/16 0001    Standardized Balance Assessment   Five times sit to stand comments  10s   10 Meter Walk 1.69m/s   Berg Balance Test   Sit to Stand Able to stand without using hands and stabilize independently   Standing Unsupported Able to stand safely 2 minutes   Sitting with Back Unsupported but Feet Supported on Floor or Stool Able to sit safely and securely  2 minutes   Stand to Sit Sits safely with minimal use of hands   Transfers Able to transfer safely, minor use of hands   Standing Unsupported with Eyes Closed Able to stand 10 seconds safely   Standing Ubsupported with Feet Together Able to place feet together independently and stand 1 minute safely   From Standing, Reach Forward with Outstretched Arm Can reach confidently >25 cm (10")   From Standing Position, Pick up Object from Floor Able to pick up shoe safely and easily   From Standing Position, Turn to Look Behind Over each Shoulder Looks behind from both sides and weight shifts well   Turn 360 Degrees Able to turn 360 degrees safely in 4 seconds or less   Standing Unsupported, Alternately Place Feet on Step/Stool Able to stand  independently and safely and complete 8 steps in 20 seconds   Standing Unsupported, One Foot in Front Able to place foot tandem independently and hold 30 seconds   Standing on One Leg Able to lift leg independently and hold equal to or more than 3 seconds   Total Score 54   Functional Gait  Assessment   Gait assessed  Yes   Gait Level Surface Walks 20 ft in less than 5.5 sec, no assistive devices, good speed, no evidence for imbalance, normal gait pattern, deviates no more than 6 in outside of the 12 in walkway width.   Change in Gait Speed Able to smoothly change walking speed without loss of balance or gait deviation. Deviate no more than 6 in outside of the 12 in walkway width.   Gait with Horizontal Head Turns Performs head turns smoothly with slight change in gait velocity (eg, minor disruption to smooth gait path), deviates 6-10 in outside 12 in walkway width, or uses an assistive device.   Gait with Vertical Head Turns Performs task with slight change in gait velocity (eg, minor disruption to smooth gait path), deviates 6 - 10 in outside 12 in walkway width or uses assistive device   Gait and Pivot Turn Pivot turns safely within 3 sec and stops quickly with no loss of balance.   Step Over Obstacle Is able to step over one shoe box (4.5 in total height) but must slow down and adjust steps to clear box safely. May require verbal cueing.   Gait with Narrow Base of Support Ambulates 4-7 steps.   Gait with Eyes Closed Walks 20 ft, slow speed, abnormal gait pattern, evidence for imbalance, deviates 10-15 in outside 12 in walkway width. Requires more than 9 sec to ambulate 20 ft.   Ambulating Backwards Walks 20 ft, slow speed, abnormal gait pattern, evidence for imbalance, deviates 10-15 in outside 12 in walkway width.   Steps Alternating feet, must use rail.   Total Score 19      45min walk: 1275ft                       PT Education - 02/10/16 0840    Education provided Yes    Education Details PT plan of care   Person(s) Educated Patient   Methods Explanation   Comprehension Verbalized understanding             PT Long Term Goals - 02/09/16 1731    PT LONG TERM GOAL #1   Title Pt will have identified AD and be able to use appropriately   Baseline currently pt has SPC and RW but does not know which is  more appropriate   Time 6   Period Weeks   Status Achieved   PT LONG TERM GOAL #2   Title Pt will be able to perform sit<>stand I with use of UE   Baseline CGA for sit<>stand   Time 6   Period Weeks   Status Achieved   PT LONG TERM GOAL #3   Title Pt will improve 10MWS to .8 m/s to improve household ambulation.   Baseline .4 m/s   Time 6   Period Weeks   Status Achieved   PT LONG TERM GOAL #4   Title Patient will increase six minute walk test distance to >1000 feet without foot drag for progression to community ambulator and improve gait ability   Time 4   Period Weeks   Status Achieved   PT LONG TERM GOAL #5   Title  Patient will demonstrate an improved Berg Balance Score of > 52/56 as to demonstrate improved balance with ADLs such as sitting/standing and transfer balance and reduced fall risk.    Time 4   Period Weeks   Status Achieved   PT LONG TERM GOAL #6   Title Patient will increase Functional Gait Assessment score to >20/30 as to reduce fall risk and improve dynamic gait safety with community ambulation.   Time 4   Period Weeks   Status On-going               Plan - 02/10/16 [id number]    Clinical Impression Statement pt has made great progress towards goals and improved his outcome measures signifiantly the past 4 weeks. pt still at moderate risk for falls and would benefit from continued skilled PT services to maximzie independence in mobility    Rehab Potential Good   Clinical Impairments Affecting Rehab Potential medical comorbidities/family assistance   PT Frequency 2x / week   PT Duration 4 weeks   PT  Treatment/Interventions ADLs/Self Care Home Management;Passive range of motion;Therapeutic exercise;Manual techniques;Therapeutic activities;Functional mobility training;Balance training;Gait training;Stair training;Neuromuscular re-education   PT Next Visit Plan work on coordination, balance   PT Home Exercise Plan continue as given;    Consulted and Agree with Plan of Care Patient;Family member/caregiver      Patient will benefit from skilled therapeutic intervention in order to improve the following deficits and impairments:  Abnormal gait, Decreased range of motion, Improper body mechanics, Pain, Difficulty walking, Impaired flexibility, Postural dysfunction, Decreased coordination, Decreased safety awareness, Decreased balance, Decreased mobility, Decreased strength  Visit Diagnosis: Muscle weakness (generalized)  Difficulty in walking, not elsewhere classified     Problem List Patient Active Problem List   Diagnosis Date Noted  . Confusion 10/18/2015  . Constipation 08/23/2015  . Uncontrolled type 2 diabetes mellitus with hyperglycemia, with long-term current use of insulin ([place]) 08/23/2015  . Essential hypertension 08/23/2015  . Right lower lobe pneumonia 08/23/2015  . Abdominal pain 08/21/2015  . Altered mental status 02/19/2015   Maurice Patterson, PT, DPT [id number]  Aariana Shankland 02/10/2016, 8:46 AM  [place] MAIN [facility] SERVICES [address] [place], [place], [address] Phone: [phone]   Fax:  [phone]  Name: NEERAJ PEDICINI MRN: [id number] Date of Birth: 18-Aug-1956

## 2016-02-11 ENCOUNTER — Ambulatory Visit: Payer: PPO

## 2016-02-11 ENCOUNTER — Ambulatory Visit: Payer: PPO | Admitting: Occupational Therapy

## 2016-02-11 DIAGNOSIS — M6281 Muscle weakness (generalized): Secondary | ICD-10-CM | POA: Diagnosis not present

## 2016-02-11 DIAGNOSIS — R278 Other lack of coordination: Secondary | ICD-10-CM

## 2016-02-11 NOTE — Therapy (Signed)
[place] MAIN [facility] SERVICES [address] [place], [place], [address] Phone: [phone]   Fax:  [phone]  Occupational Therapy Treatment  Patient Details  Name: Maurice Patterson MRN: [id number] Date of Birth: 1956/02/14 Referring Provider: Dyanne Carrel  Encounter Date: 02/11/2016      OT End of Session - 02/11/16 1720    Visit Number 16   Number of Visits 16   Date for OT Re-Evaluation 02/13/16   Authorization Type Medicare g code 6 of 10   OT Start Time [id number]   OT Stop Time 1658   OT Time Calculation (min) 41 min   Activity Tolerance Patient tolerated treatment well   Behavior During Therapy [facility] for tasks assessed/performed      Past Medical History  Diagnosis Date  . Seizures ([place])     staring spells  . Memory loss   . Arthritis   . Stroke ([place])   . Hypertension   . Diabetes mellitus without complication ([place])   . Malignant intraductal papillary mucinous tumor of pancreas ([place])   . Hernia of abdominal cavity   . Degenerative lumbar disc   . Acute MI ([place])   . TIA (transient ischemic attack)     Past Surgical History  Procedure Laterality Date  . Left arm metal plate    . Stent times 2  2013    Cardiac  . Left shoulder surgery    . Ercp N/A 09/12/2015    Procedure: ENDOSCOPIC RETROGRADE CHOLANGIOPANCREATOGRAPHY (ERCP);  Surgeon: Hulen Luster, MD;  Location: [facility] ENDOSCOPY;  Service: Gastroenterology;  Laterality: N/A;  . Ercp N/A 01/02/2016    Procedure: ENDOSCOPIC RETROGRADE CHOLANGIOPANCREATOGRAPHY (ERCP);  Surgeon: Hulen Luster, MD;  Location: [facility] ENDOSCOPY;  Service: Gastroenterology;  Laterality: N/A;    There were no vitals filed for this visit.      Subjective Assessment - 02/11/16 1719    Subjective  pt. reports having stomach and bowel issues from the medicine.   Patient is accompained by: Family member   Pertinent History Patient reports he had a headache and went [facility] and  there was not ventilation for the chorine and he felt sick and was transported to the hospital.  Wife reports he was given [place] and he was allergic and had a reaction at the hospital. Had shingles during his hospital stay.  Patient had a pancreatic stent placed on 09/13/2015 prior to the stroke and will have to have a follow up surgery for scar tissue on 01/02/2016.   Patient Stated Goals Patient reports he would like to drive again, he has not driven since having a pancreatic stent 09/13/2015.    Currently in Pain? Yes   Pain Score 2         OT TREATMENT    Neuro muscular re-education:  Pt. Worked on the [employer] for [employer]. Pt. Requires cues for set-up, and hand placement. Pt. Requires constant monitoring of hands on the handles. Pt. Was able to to complete level 2.7 for 8 min. Forward and reverse every 2 min. With cues. Pt. performed gross gripping with grip strengthener. Pt. worked on sustaining grip while grasping pegs and reaching at various heights. Pt. Worked with both the left and Right hands. Pt. Completed the gripper in  The 3rd resistive slot on the right, and the 2nd resistive slot on the left. Pt. Required frequent cues.  Therapeutic Exercise:  Pt. Worked on grasping  1" resistive cubes  with his thumb and 2nd digit. Pt. Requires max Verbal, tactile cues, and visual demonstration to complete grasping, as well as changing to press back them in place with 2nd digit isolation.                                OT Long Term Goals - 01/23/16 1050    OT LONG TERM GOAL #1   Title Patient will complete bathing with modified independence.   Baseline independent   Time 8   Period Weeks   Status Achieved   OT LONG TERM GOAL #2   Title Patient will complete homemaking tasks of cleaning and laundry with modified independence.   Time 8   Period Weeks   Status Achieved   OT LONG TERM GOAL #3   Title Patient will increase bilateral UE strength to  perform yard work tasks with supervision.   Baseline unable   Time 8   Period Weeks   Status On-going   OT LONG TERM GOAL #4   Title Patient will complete hot meal preparation with modified independence.   Time 8   Period Weeks   Status Achieved               Plan - 02/11/16 1721    Clinical Impression Statement Pt. continues to require frequent , clear, simple step by step cues for movement patterns with grasping. Pt. is able to complete the movements with visual, verbal, and tactile cues. Pt. is able to begin the movements, however intermittently changes or deviates from the movement even with consistent cuing.   Rehab Potential Good   OT Frequency 2x / week   OT Duration 8 weeks   OT Treatment/Interventions Self-care/ADL training;Therapeutic exercise;Patient/family education;Neuromuscular education;Manual Therapy;Balance training;Therapeutic exercises;DME and/or AE instruction;Therapeutic activities;Cognitive remediation/compensation   Consulted and Agree with Plan of Care Patient      Patient will benefit from skilled therapeutic intervention in order to improve the following deficits and impairments:  Abnormal gait  Visit Diagnosis: Muscle weakness (generalized)  Other lack of coordination    Problem List Patient Active Problem List   Diagnosis Date Noted  . Confusion 10/18/2015  . Constipation 08/23/2015  . Uncontrolled type 2 diabetes mellitus with hyperglycemia, with long-term current use of insulin ([place]) 08/23/2015  . Essential hypertension 08/23/2015  . Right lower lobe pneumonia 08/23/2015  . Abdominal pain 08/21/2015  . Altered mental status 02/19/2015   Harrel Carina, MS, OTR/L  Harrel Carina 02/11/2016, 5:38 PM  [place] MAIN [facility] SERVICES [address] [place], [place], [address] Phone: [phone]   Fax:  [phone]  Name: Maurice Patterson MRN: [id number] Date of Birth: [birth year]

## 2016-02-11 NOTE — Patient Instructions (Signed)
OT TREATMENT    Neuro muscular re-education:  Pt. Worked on the [employer] for [employer]. Pt. Requires cues for set-up, and hand placement. Pt. Requires constant monitoring of hands on the handles. Pt. Was able to to complete level 2.7 for 8 min. Forward and reverse every 2 min. With cues. Pt. performed gross gripping with grip strengthener. Pt. worked on sustaining grip while grasping pegs and reaching at various heights. Pt. Worked with both the left and Right hands. Pt. Completed the gripper in  The 3rd resistive slot on the right, and the 2nd resistive slot on the left. Pt. Required frequent cues.  Therapeutic Exercise:  Pt. Worked on grasping  1" resistive cubes with his thumb and 2nd digit. Pt. Requires max Verbal, tactile cues, and visual demonstration to complete grasping, as well as changing to press back them in place with 2nd digit isolation.     :

## 2016-02-12 NOTE — Therapy (Signed)
[place] MAIN [facility] SERVICES [address] [place], [place], [address] Phone: [phone]   Fax:  [phone]  Physical Therapy Treatment  Patient Details  Name: Maurice Patterson MRN: [id number] Date of Birth: [birth year] Referring Provider: Astrid Divine  Encounter Date: 02/11/2016      PT End of Session - 02/12/16 1033    Visit Number 12   Number of Visits 21   Date for PT Re-Evaluation 03/08/16   Authorization Type 3/10   Authorization Time Period 10   PT Start Time 1700   PT Stop Time 1745   PT Time Calculation (min) 45 min   Equipment Utilized During Treatment Gait belt   Activity Tolerance Patient tolerated treatment well   Behavior During Therapy [facility] for tasks assessed/performed      Past Medical History  Diagnosis Date  . Seizures ([place])     staring spells  . Memory loss   . Arthritis   . Stroke ([place])   . Hypertension   . Diabetes mellitus without complication ([place])   . Malignant intraductal papillary mucinous tumor of pancreas ([place])   . Hernia of abdominal cavity   . Degenerative lumbar disc   . Acute MI ([place])   . TIA (transient ischemic attack)     Past Surgical History  Procedure Laterality Date  . Left arm metal plate    . Stent times 2  2013    Cardiac  . Left shoulder surgery    . Ercp N/A 09/12/2015    Procedure: ENDOSCOPIC RETROGRADE CHOLANGIOPANCREATOGRAPHY (ERCP);  Surgeon: Hulen Luster, MD;  Location: [facility] ENDOSCOPY;  Service: Gastroenterology;  Laterality: N/A;  . Ercp N/A 01/02/2016    Procedure: ENDOSCOPIC RETROGRADE CHOLANGIOPANCREATOGRAPHY (ERCP);  Surgeon: Hulen Luster, MD;  Location: [facility] ENDOSCOPY;  Service: Gastroenterology;  Laterality: N/A;    There were no vitals filed for this visit.      Subjective Assessment - 02/12/16 1032    Subjective pt reports doing well on this date. he likes coming to PT   Patient is accompained by: Family member   Pertinent History Extensive and complex medical history  per pt report of multiple strokes, frequent falls. Pt has caregiver present at all times and requires assistance for higher level ADLs. Pt has spent time at [facility] - unclear of length of time or how recently, following this he was seen at [facility] where per pt's son he went from completely deconditioned to ambulating I. Also reports he was issued AD but did not bring any and is not using any at home.   Limitations Walking;Standing;Lifting   How long can you walk comfortably? inside house.   Patient Stated Goals Pt would like to return to work, be able to walk better.   Currently in Pain? No/denies   Pain Onset More than a month ago       NMR: LE cone reach in SLS (3 cones fwd/side and retro) x 2 min each side no UE CGA to min A for safety In hallway fwd/retro walking with ball toss/bounce x 6 min Fwd retro walking 2 x 65ft each with cognitive dual tasking Side stepping with ball bounce on wall 77ft x 2 each way Agility ladder x 10 min pt needed mod/max verbal tactile cuing 3 step SLS with max cues 2 x  75min  pt requires CGA for safety on balance exercises  PT Education - 02/12/16 1033    Education provided Yes   Education Details sequencing for activities    Person(s) Educated Patient   Methods Explanation   Comprehension Verbalized understanding;Verbal cues required;Tactile cues required;Need further instruction             PT Long Term Goals - 02/09/16 1731    PT LONG TERM GOAL #1   Title Pt will have identified AD and be able to use appropriately   Baseline currently pt has SPC and RW but does not know which is more appropriate   Time 6   Period Weeks   Status Achieved   PT LONG TERM GOAL #2   Title Pt will be able to perform sit<>stand I with use of UE   Baseline CGA for sit<>stand   Time 6   Period Weeks   Status Achieved   PT LONG TERM GOAL #3   Title Pt will improve 10MWS to .8 m/s to improve household ambulation.    Baseline .4 m/s   Time 6   Period Weeks   Status Achieved   PT LONG TERM GOAL #4   Title Patient will increase six minute walk test distance to >1000 feet without foot drag for progression to community ambulator and improve gait ability   Time 4   Period Weeks   Status Achieved   PT LONG TERM GOAL #5   Title  Patient will demonstrate an improved Berg Balance Score of > 52/56 as to demonstrate improved balance with ADLs such as sitting/standing and transfer balance and reduced fall risk.    Time 4   Period Weeks   Status Achieved   PT LONG TERM GOAL #6   Title Patient will increase Functional Gait Assessment score to >20/30 as to reduce fall risk and improve dynamic gait safety with community ambulation.   Time 4   Period Weeks   Status On-going               Plan - 02/12/16 1043    Clinical Impression Statement pt had improved coordination evidenced by improved performance in agility ladder today, however he does continue to need mod-max cuing and demonstration. pt did well dual tasking with UE tasks in hallway, but had trouble dual tasking walking and cognitive tasks    Rehab Potential Good   Clinical Impairments Affecting Rehab Potential medical comorbidities/family assistance   PT Frequency 2x / week   PT Duration 4 weeks   PT Treatment/Interventions ADLs/Self Care Home Management;Passive range of motion;Therapeutic exercise;Manual techniques;Therapeutic activities;Functional mobility training;Balance training;Gait training;Stair training;Neuromuscular re-education   PT Next Visit Plan work on coordination, balance   PT Home Exercise Plan continue as given;    Consulted and Agree with Plan of Care Patient;Family member/caregiver      Patient will benefit from skilled therapeutic intervention in order to improve the following deficits and impairments:  Abnormal gait, Decreased range of motion, Improper body mechanics, Pain, Difficulty walking, Impaired flexibility,  Postural dysfunction, Decreased coordination, Decreased safety awareness, Decreased balance, Decreased mobility, Decreased strength  Visit Diagnosis: Muscle weakness (generalized)  Other lack of coordination     Problem List Patient Active Problem List   Diagnosis Date Noted  . Confusion 10/18/2015  . Constipation 08/23/2015  . Uncontrolled type 2 diabetes mellitus with hyperglycemia, with long-term current use of insulin ([place]) 08/23/2015  . Essential hypertension 08/23/2015  . Right lower lobe pneumonia 08/23/2015  . Abdominal pain 08/21/2015  . Altered mental status 02/19/2015  Gorden Harms. Jinan Biggins, PT, DPT [phone]  Ethen Bannan 02/12/2016, 10:46 AM  [place] MAIN [facility] SERVICES [address] [place], [place], [address] Phone: [phone]   Fax:  [phone]  Name: Maurice Patterson MRN: [id number] Date of Birth: [birth year]

## 2016-02-16 ENCOUNTER — Ambulatory Visit: Payer: PPO | Admitting: Occupational Therapy

## 2016-02-16 ENCOUNTER — Ambulatory Visit: Payer: PPO

## 2016-02-16 DIAGNOSIS — M6281 Muscle weakness (generalized): Secondary | ICD-10-CM

## 2016-02-16 DIAGNOSIS — R278 Other lack of coordination: Secondary | ICD-10-CM

## 2016-02-16 DIAGNOSIS — R262 Difficulty in walking, not elsewhere classified: Secondary | ICD-10-CM

## 2016-02-16 NOTE — Therapy (Signed)
[place] MAIN [facility] SERVICES [address] [place], [place], [address] Phone: [phone]   Fax:  [phone]  Physical Therapy Treatment  Patient Details  Name: Maurice Patterson MRN: [id number] Date of Birth: September 20, 1955 Referring Provider: Astrid Divine  Encounter Date: 02/16/2016      PT End of Session - 02/16/16 1754    Visit Number 13   Number of Visits 21   Date for PT Re-Evaluation 03/08/16   Authorization Type 4/10   Authorization Time Period 10   PT Start Time 1700   PT Stop Time 1745   PT Time Calculation (min) 45 min   Equipment Utilized During Treatment Gait belt   Activity Tolerance Patient tolerated treatment well   Behavior During Therapy [facility] for tasks assessed/performed      Past Medical History  Diagnosis Date  . Seizures ([place])     staring spells  . Memory loss   . Arthritis   . Stroke ([place])   . Hypertension   . Diabetes mellitus without complication ([place])   . Malignant intraductal papillary mucinous tumor of pancreas ([place])   . Hernia of abdominal cavity   . Degenerative lumbar disc   . Acute MI ([place])   . TIA (transient ischemic attack)     Past Surgical History  Procedure Laterality Date  . Left arm metal plate    . Stent times 2  2013    Cardiac  . Left shoulder surgery    . Ercp N/A 09/12/2015    Procedure: ENDOSCOPIC RETROGRADE CHOLANGIOPANCREATOGRAPHY (ERCP);  Surgeon: Hulen Luster, MD;  Location: [facility] ENDOSCOPY;  Service: Gastroenterology;  Laterality: N/A;  . Ercp N/A 01/02/2016    Procedure: ENDOSCOPIC RETROGRADE CHOLANGIOPANCREATOGRAPHY (ERCP);  Surgeon: Hulen Luster, MD;  Location: [facility] ENDOSCOPY;  Service: Gastroenterology;  Laterality: N/A;    There were no vitals filed for this visit.      Subjective Assessment - 02/16/16 1709    Subjective pt states he's doing alright today.   Patient is accompained by: Family member   Pertinent History Extensive and complex medical history per pt report of  multiple strokes, frequent falls. Pt has caregiver present at all times and requires assistance for higher level ADLs. Pt has spent time at [facility] - unclear of length of time or how recently, following this he was seen at [facility] where per pt's son he went from completely deconditioned to ambulating I. Also reports he was issued AD but did not bring any and is not using any at home.   Limitations Walking;Standing;Lifting   How long can you walk comfortably? inside house.   Patient Stated Goals Pt would like to return to work, be able to walk better.   Currently in Pain? No/denies   Pain Onset More than a month ago       THEREX Nustep, L2 x 5 min, LE only (unbilled)   GAIT TRAINING dynamic gait training outside in grass:  fwd ambulation 167ftx2; verbal cues to increase foot clearance  fwd/retro and side stepping into/out of grass and onto sidewalk x 15 each way; max verbal cues for exercise technique and CGA for balance bil side stepping with ball toss 1x50 ft each; CGA - min A for balance; mod verbal cues to increase foot clearance Fwd walking with EC 4x20 ft; CGA for balance, mod verbal cues to increase foot clearance and step length Backwards walking 2x62ft; CGA, cues to increase foot clearance and step length Alternating fwd/retro walking and  bil side stepping on command, 2x52ft each; CGA, mild difficulty with sequencing Alternating kicking ball x 128ft; CGA - min A, mild difficulty with sequencing movement Bil grapevine x 5 each; min A for balance, more difficulty with sequencing crossing LLE in front of RLE; max verbal/visual cues         PT Education - 02/16/16 1754    Education provided Yes   Education Details gait on uneven surface, increase foot clearance   Person(s) Educated Patient   Methods Explanation   Comprehension Verbalized understanding             PT Long Term Goals - 02/09/16 1731    PT LONG TERM GOAL #1   Title Pt will have identified AD and be  able to use appropriately   Baseline currently pt has SPC and RW but does not know which is more appropriate   Time 6   Period Weeks   Status Achieved   PT LONG TERM GOAL #2   Title Pt will be able to perform sit<>stand I with use of UE   Baseline CGA for sit<>stand   Time 6   Period Weeks   Status Achieved   PT LONG TERM GOAL #3   Title Pt will improve 10MWS to .8 m/s to improve household ambulation.   Baseline .4 m/s   Time 6   Period Weeks   Status Achieved   PT LONG TERM GOAL #4   Title Patient will increase six minute walk test distance to >1000 feet without foot drag for progression to community ambulator and improve gait ability   Time 4   Period Weeks   Status Achieved   PT LONG TERM GOAL #5   Title  Patient will demonstrate an improved Berg Balance Score of > 52/56 as to demonstrate improved balance with ADLs such as sitting/standing and transfer balance and reduced fall risk.    Time 4   Period Weeks   Status Achieved   PT LONG TERM GOAL #6   Title Patient will increase Functional Gait Assessment score to >20/30 as to reduce fall risk and improve dynamic gait safety with community ambulation.   Time 4   Period Weeks   Status On-going               Plan - 02/16/16 1755    Clinical Impression Statement pt continues to make progress towards goals as evidenced by his ability to participate in dynamic gait training outside in the grass. pt still demonstrates difficutly with sequencing as evidenced by need for visual and verbal cues during some exercises. His balance continues to improve but he still tends to drag his feet when walking on uneven surfaces. pt needs continued skilled PT intervention to address remaining deficits in order to maximize independence and decrease risk for falls.   Rehab Potential Good   Clinical Impairments Affecting Rehab Potential medical comorbidities/family assistance   PT Frequency 2x / week   PT Duration 4 weeks   PT  Treatment/Interventions ADLs/Self Care Home Management;Passive range of motion;Therapeutic exercise;Manual techniques;Therapeutic activities;Functional mobility training;Balance training;Gait training;Stair training;Neuromuscular re-education   PT Next Visit Plan work on coordination, balance   PT Home Exercise Plan continue as given;    Consulted and Agree with Plan of Care Patient;Family member/caregiver      Patient will benefit from skilled therapeutic intervention in order to improve the following deficits and impairments:  Abnormal gait, Decreased range of motion, Improper body mechanics, Pain, Difficulty walking, Impaired flexibility,  Postural dysfunction, Decreased coordination, Decreased safety awareness, Decreased balance, Decreased mobility, Decreased strength  Visit Diagnosis: Muscle weakness (generalized)  Other lack of coordination  Difficulty in walking, not elsewhere classified     Problem List Patient Active Problem List   Diagnosis Date Noted  . Confusion 10/18/2015  . Constipation 08/23/2015  . Uncontrolled type 2 diabetes mellitus with hyperglycemia, with long-term current use of insulin ([place]) 08/23/2015  . Essential hypertension 08/23/2015  . Right lower lobe pneumonia 08/23/2015  . Abdominal pain 08/21/2015  . Altered mental status 02/19/2015    Geraldine Solar, SPT This entire session was performed under direct supervision and direction of a licensed therapist/therapist assistant . I have personally read, edited and approve of the note as written. Gorden Harms. Tortorici, PT, DPT [phone]  Tortorici,Ashley 02/17/2016, 10:12 AM  [place] MAIN [facility] SERVICES [address] [place], [place], [address] Phone: [phone]   Fax:  [phone]  Name: Maurice Patterson MRN: [id number] Date of Birth: 1956-01-19

## 2016-02-16 NOTE — Patient Instructions (Signed)
OT TREATMENT    Therapeutic Exercise:  Pt. Worked on the [employer] for 8 min. On level 3.0 with consistent cues for set up of hand on handle, and cues to continue. Pt. Was able to complete forward and reverse. Pt. Worked on pinch strengthening in the left hand for lateral, and 3pt pinch using red, green, and blue resistive clips.Tactlie and verbal cues were required for eliciting the desired movement. Pt. Worked on placing them at various vertical angles. Pt. Worked on grasping 1" resistive cubes with his thumb and 2nd digit. Pt. Worked on placing them with isolating the 2nd digit. Pt. Requires constant visual demonstration.

## 2016-02-16 NOTE — Therapy (Signed)
[place] MAIN [facility] SERVICES [address] [place], [place], [address] Phone: [phone]   Fax:  [phone]  Occupational Therapy Treatment  Patient Details  Name: Maurice Patterson MRN: [id number] Date of Birth: Sep 20, 1955 Referring Provider: Dyanne Carrel  Encounter Date: 02/16/2016      OT End of Session - 02/16/16 1707    Visit Number 17   Number of Visits 24   Date for OT Re-Evaluation 03/15/16   Authorization Type Medicare g code 7 of 10   OT Start Time [id number]   OT Stop Time 1700   OT Time Calculation (min) 45 min   Activity Tolerance Patient tolerated treatment well   Behavior During Therapy [facility] for tasks assessed/performed      Past Medical History  Diagnosis Date  . Seizures ([place])     staring spells  . Memory loss   . Arthritis   . Stroke ([place])   . Hypertension   . Diabetes mellitus without complication ([place])   . Malignant intraductal papillary mucinous tumor of pancreas ([place])   . Hernia of abdominal cavity   . Degenerative lumbar disc   . Acute MI ([place])   . TIA (transient ischemic attack)     Past Surgical History  Procedure Laterality Date  . Left arm metal plate    . Stent times 2  2013    Cardiac  . Left shoulder surgery    . Ercp N/A 09/12/2015    Procedure: ENDOSCOPIC RETROGRADE CHOLANGIOPANCREATOGRAPHY (ERCP);  Surgeon: Hulen Luster, MD;  Location: [facility] ENDOSCOPY;  Service: Gastroenterology;  Laterality: N/A;  . Ercp N/A 01/02/2016    Procedure: ENDOSCOPIC RETROGRADE CHOLANGIOPANCREATOGRAPHY (ERCP);  Surgeon: Hulen Luster, MD;  Location: [facility] ENDOSCOPY;  Service: Gastroenterology;  Laterality: N/A;    There were no vitals filed for this visit.      Subjective Assessment - 02/16/16 1705    Subjective  Pt. reports that he continues to have stomach issues.   Patient is accompained by: Family member   Pertinent History Patient reports he had a headache and [facility] and there was  not ventilation for the chorine and he felt sick and was transported to the hospital.  Wife reports he was given [place] and he was allergic and had a reaction at the hospital. Had shingles during his hospital stay.  Patient had a pancreatic stent placed on 09/13/2015 prior to the stroke and will have to have a follow up surgery for scar tissue on 01/02/2016.   Patient Stated Goals Patient reports he would like to drive again, he has not driven since having a pancreatic stent 09/13/2015.    Currently in Pain? No/denies      OT TREATMENT    Therapeutic Exercise:  Pt. Worked on the [employer] for 8 min. On level 3.0 with consistent cues for set up of hand on handle, and cues to continue. Pt. Was able to complete forward and reverse. Pt. Worked on pinch strengthening in the left hand for lateral, and 3pt pinch using red, green, and blue resistive clips.Tactlie and verbal cues were required for eliciting the desired movement. Pt. Worked on placing them at various vertical angles. Pt. Worked on grasping 1" resistive cubes with his thumb and 2nd digit. Pt. Worked on placing them with isolating the 2nd digit. Pt. Requires constant visual demonstration.  OT Long Term Goals - 03-04-2016 1729    OT LONG TERM GOAL #1   Title Patient will complete bathing with modified independence.   Baseline independent   Time 8   Period Weeks   Status Achieved   OT LONG TERM GOAL #2   Title Patient will complete homemaking tasks of cleaning and laundry with modified independence.   Baseline moderate to max assist   Time 8   Period Weeks   Status Achieved   OT LONG TERM GOAL #3   Title Patient will increase bilateral UE strength to perform yard work tasks with supervision.   Baseline unable   Time 8   Period Weeks   Status On-going   OT LONG TERM GOAL #4   Title Patient will complete hot meal preparation with modified independence.   Baseline light meal prep  currently.   Time 8   Period Weeks   Status Achieved               Plan - 03-04-16 1720    Clinical Impression Statement Pt. continues to work towards improving UE strength and coordination to be able to complete IADL tasks. Pt. requires continuous verbal and tactile cues after visual demonstration of specified movements. Pt. requires cues for task initiation, and continues to intermittently deviates from the movement during the task, requiring cues. to initiate.    Rehab Potential Good   Clinical Impairments Affecting Rehab Potential Patient has a previous crush injury to left side in which he has limited ROM and diminished use.    OT Frequency 2x / week   OT Duration 4 weeks      Patient will benefit from skilled therapeutic intervention in order to improve the following deficits and impairments:  Decreased activity tolerance, Decreased strength, Decreased coordination, Decreased safety awareness, Impaired UE functional use  Visit Diagnosis: Muscle weakness (generalized)      G-Codes - 2016-03-04 1734    Functional Assessment Tool Used Clinical judgement based on pt. current funtional level.   Functional Limitation Self care   Self Care Current Status [phone]) At least 20 percent but less than 40 percent impaired, limited or restricted   Self Care Goal Status [id number]) At least 1 percent but less than 20 percent impaired, limited or restricted      Problem List Patient Active Problem List   Diagnosis Date Noted  . Confusion 10/18/2015  . Constipation 08/23/2015  . Uncontrolled type 2 diabetes mellitus with hyperglycemia, with long-term current use of insulin ([place]) 08/23/2015  . Essential hypertension 08/23/2015  . Right lower lobe pneumonia 08/23/2015  . Abdominal pain 08/21/2015  . Altered mental status 02/19/2015   Harrel Carina, MS, OTR/L   Harrel Carina 2016-03-04, 5:43 PM  [place] MAIN [facility] SERVICES [address] [place], [place], [address] Phone: [phone]   Fax:  [phone]  Name: Maurice Patterson MRN: [id number] Date of Birth: 1955-11-05

## 2016-02-18 ENCOUNTER — Ambulatory Visit: Payer: PPO | Admitting: Occupational Therapy

## 2016-02-18 ENCOUNTER — Ambulatory Visit: Payer: PPO

## 2016-02-18 DIAGNOSIS — R262 Difficulty in walking, not elsewhere classified: Secondary | ICD-10-CM

## 2016-02-18 DIAGNOSIS — M6281 Muscle weakness (generalized): Secondary | ICD-10-CM

## 2016-02-18 DIAGNOSIS — R278 Other lack of coordination: Secondary | ICD-10-CM

## 2016-02-18 NOTE — Therapy (Signed)
[place] MAIN [facility] SERVICES [address] [place], [place], [address] Phone: [phone]   Fax:  [phone]  Physical Therapy Treatment  Patient Details  Name: Maurice Patterson MRN: [id number] Date of Birth: [birth year] Referring Provider: Astrid Divine  Encounter Date: 02/18/2016      PT End of Session - 02/18/16 1845    Visit Number 14   Number of Visits 21   Date for PT Re-Evaluation 03/08/16   Authorization Type 5/10   Authorization Time Period 10   PT Start Time 1705   PT Stop Time 1745   PT Time Calculation (min) 40 min   Equipment Utilized During Treatment Gait belt   Activity Tolerance Patient tolerated treatment well   Behavior During Therapy [employer] for tasks assessed/performed      Past Medical History  Diagnosis Date  . Seizures ([place])     staring spells  . Memory loss   . Arthritis   . Stroke ([place])   . Hypertension   . Diabetes mellitus without complication ([place])   . Malignant intraductal papillary mucinous tumor of pancreas ([place])   . Hernia of abdominal cavity   . Degenerative lumbar disc   . Acute MI ([place])   . TIA (transient ischemic attack)     Past Surgical History  Procedure Laterality Date  . Left arm metal plate    . Stent times 2  2013    Cardiac  . Left shoulder surgery    . Ercp N/A 09/12/2015    Procedure: ENDOSCOPIC RETROGRADE CHOLANGIOPANCREATOGRAPHY (ERCP);  Surgeon: Hulen Luster, MD;  Location: [facility] ENDOSCOPY;  Service: Gastroenterology;  Laterality: N/A;  . Ercp N/A 01/02/2016    Procedure: ENDOSCOPIC RETROGRADE CHOLANGIOPANCREATOGRAPHY (ERCP);  Surgeon: Hulen Luster, MD;  Location: [facility] ENDOSCOPY;  Service: Gastroenterology;  Laterality: N/A;    There were no vitals filed for this visit.      Subjective Assessment - 02/18/16 1709    Subjective pt states that his neck is hurting him today    Patient is accompained by: Family member   Pertinent History Extensive and complex medical history per pt report  of multiple strokes, frequent falls. Pt has caregiver present at all times and requires assistance for higher level ADLs. Pt has spent time at [facility] - unclear of length of time or how recently, following this he was seen at [facility] where per pt's son he went from completely deconditioned to ambulating I. Also reports he was issued AD but did not bring any and is not using any at home.   Limitations Walking;Standing;Lifting   How long can you walk comfortably? inside house.   Patient Stated Goals Pt would like to return to work, be able to walk better.   Currently in Pain? Yes   Pain Score 3    Pain Location Neck   Pain Orientation Right;Left   Pain Descriptors / Indicators Aching   Pain Onset In the past 7 days            [facility] PT Assessment - 02/18/16 0001    Functional Gait  Assessment   Gait assessed  Yes   Gait Level Surface Walks 20 ft in less than 5.5 sec, no assistive devices, good speed, no evidence for imbalance, normal gait pattern, deviates no more than 6 in outside of the 12 in walkway width.   Change in Gait Speed Able to smoothly change walking speed without loss of balance or gait deviation. Deviate no more  than 6 in outside of the 12 in walkway width.   Gait with Horizontal Head Turns Performs head turns smoothly with no change in gait. Deviates no more than 6 in outside 12 in walkway width   Gait with Vertical Head Turns Performs head turns with no change in gait. Deviates no more than 6 in outside 12 in walkway width.   Gait and Pivot Turn Pivot turns safely within 3 sec and stops quickly with no loss of balance.   Step Over Obstacle Is able to step over one shoe box (4.5 in total height) without changing gait speed. No evidence of imbalance.   Gait with Narrow Base of Support Ambulates 4-7 steps.   Gait with Eyes Closed Walks 20 ft, slow speed, abnormal gait pattern, evidence for imbalance, deviates 10-15 in outside 12 in walkway width. Requires more than 9 sec to  ambulate 20 ft.   Ambulating Backwards Walks 20 ft, uses assistive device, slower speed, mild gait deviations, deviates 6-10 in outside 12 in walkway width.   Steps Alternating feet, must use rail.   Total Score 23        THEREX Nustep L4 x 3 min, LE only (unbilled) Outcome measure: FGA: 23/30; mild impairment, improvement from last assessment (19/30)  NMR bil staggered stance with front foot on dynadisc and balloon taps against mirror x 5 min; pt fell with LLE elevated on dynadisc  bil toe taps fwd and lateral with 6" step and on airex pad, 1x10 each; CGA throughout, min sequencing difficulty bil staggered stance and bil narrow staggered stance on airex pad with EC, 3x30 each; CGA, tended to keep weight shifted posteriorly          PT Education - 02/18/16 1843    Education provided Yes   Education Details discharge today, balance assessment improvement   Person(s) Educated Patient   Methods Explanation   Comprehension Verbalized understanding             PT Long Term Goals - 02/18/16 1900    PT LONG TERM GOAL #1   Title Pt will have identified AD and be able to use appropriately   Baseline currently pt has SPC and RW but does not know which is more appropriate   Time 6   Period Weeks   Status Achieved   PT LONG TERM GOAL #2   Title Pt will be able to perform sit<>stand I with use of UE   Baseline CGA for sit<>stand   Time 6   Period Weeks   Status Achieved   PT LONG TERM GOAL #3   Title Pt will improve 10MWS to .8 m/s to improve household ambulation.   Baseline .4 m/s   Time 6   Period Weeks   Status Achieved   PT LONG TERM GOAL #4   Title Patient will increase six minute walk test distance to >1000 feet without foot drag for progression to community ambulator and improve gait ability   Time 4   Period Weeks   Status Achieved   PT LONG TERM GOAL #5   Title  Patient will demonstrate an improved Berg Balance Score of > 52/56 as to demonstrate improved  balance with ADLs such as sitting/standing and transfer balance and reduced fall risk.    Time 4   Period Weeks   Status Achieved   PT LONG TERM GOAL #6   Title Patient will increase Functional Gait Assessment score to >20/30 as to reduce fall risk and improve  dynamic gait safety with community ambulation.   Time 4   Period Weeks   Status Achieved               Plan - 02/18/16 1845    Clinical Impression Statement pt FGA reassessed today and has met his goal by scoring a 23/30, a 4 point increase from last assessment.  During treatment session today, pt sustained a fall backwards during dynamic balance exercise despite SPT attempt to correct loss of balance. When he fell back he did lightly hit his head on the parallel bars. He did not have any sign of injury and was easily assisted to standing. Vitals were WNL.  He denied any blurred vision, dizziness or headache and wished to continue treatment. pt was educated to go to ER if he started to feel those symptoms later. A safety portal will be filed regarding the fall. Due to pt's overall progress, pt will be discharged from PT as he has met all of his goals and made great improvements in his functional mobility.   Rehab Potential Good   Clinical Impairments Affecting Rehab Potential medical comorbidities/family assistance   PT Frequency 2x / week   PT Duration 4 weeks   PT Treatment/Interventions ADLs/Self Care Home Management;Passive range of motion;Therapeutic exercise;Manual techniques;Therapeutic activities;Functional mobility training;Balance training;Gait training;Stair training;Neuromuscular re-education   PT Next Visit Plan work on coordination, balance   PT Home Exercise Plan continue as given;    Consulted and Agree with Plan of Care Patient;Family member/caregiver      Patient will benefit from skilled therapeutic intervention in order to improve the following deficits and impairments:  Abnormal gait, Decreased range of  motion, Improper body mechanics, Pain, Difficulty walking, Impaired flexibility, Postural dysfunction, Decreased coordination, Decreased safety awareness, Decreased balance, Decreased mobility, Decreased strength  Visit Diagnosis: Muscle weakness (generalized)  Other lack of coordination  Difficulty in walking, not elsewhere classified     Problem List Patient Active Problem List   Diagnosis Date Noted  . Confusion 10/18/2015  . Constipation 08/23/2015  . Uncontrolled type 2 diabetes mellitus with hyperglycemia, with long-term current use of insulin ([place]) 08/23/2015  . Essential hypertension 08/23/2015  . Right lower lobe pneumonia 08/23/2015  . Abdominal pain 08/21/2015  . Altered mental status 02/19/2015   Geraldine Solar, SPT  This entire session was performed under direct supervision and direction of a licensed therapist/therapist assistant . I have personally read, edited and approve of the note as written. Gorden Harms. Tortorici, PT, DPT #[id number]   Geraldine Solar 02/18/2016, 7:02 PM  [place] MAIN [facility] SERVICES [address] [place], [place], [address] Phone: [phone]   Fax:  [phone]  Name: Maurice Patterson MRN: [id number] Date of Birth: 1956-06-08

## 2016-02-18 NOTE — Therapy (Signed)
[place] MAIN [facility] SERVICES [address] [place], [place], [address] Phone: [phone]   Fax:  [phone]  Occupational Therapy Treatment  Patient Details  Name: Maurice Patterson MRN: [id number] Date of Birth: 1956-03-28 Referring Provider: Dyanne Carrel  Encounter Date: 02/18/2016      OT End of Session - 02/18/16 1658    Visit Number 18   Number of Visits 24   Date for OT Re-Evaluation 03/15/16   Authorization Type Medicare g code 8 of 10   OT Start Time 1625   OT Stop Time 1700   OT Time Calculation (min) 35 min   Activity Tolerance Patient tolerated treatment well   Behavior During Therapy [facility] for tasks assessed/performed      Past Medical History  Diagnosis Date  . Seizures ([place])     staring spells  . Memory loss   . Arthritis   . Stroke ([place])   . Hypertension   . Diabetes mellitus without complication ([place])   . Malignant intraductal papillary mucinous tumor of pancreas ([place])   . Hernia of abdominal cavity   . Degenerative lumbar disc   . Acute MI ([place])   . TIA (transient ischemic attack)     Past Surgical History  Procedure Laterality Date  . Left arm metal plate    . Stent times 2  2013    Cardiac  . Left shoulder surgery    . Ercp N/A 09/12/2015    Procedure: ENDOSCOPIC RETROGRADE CHOLANGIOPANCREATOGRAPHY (ERCP);  Surgeon: Hulen Luster, MD;  Location: [facility] ENDOSCOPY;  Service: Gastroenterology;  Laterality: N/A;  . Ercp N/A 01/02/2016    Procedure: ENDOSCOPIC RETROGRADE CHOLANGIOPANCREATOGRAPHY (ERCP);  Surgeon: Hulen Luster, MD;  Location: [facility] ENDOSCOPY;  Service: Gastroenterology;  Laterality: N/A;    There were no vitals filed for this visit.      Subjective Assessment - 02/18/16 1657    Subjective  Pt. reports his stomach feels better today.   Patient is accompained by: Family member   Pertinent History Patient reports he had a headache and [facility] and there was not  ventilation for the chorine and he felt sick and was transported to the hospital.  Wife reports he was given [place] and he was allergic and had a reaction at the hospital. Had shingles during his hospital stay.  Patient had a pancreatic stent placed on 09/13/2015 prior to the stroke and will have to have a follow up surgery for scar tissue on 01/02/2016.   Patient Stated Goals Patient reports he would like to drive again, he has not driven since having a pancreatic stent 09/13/2015.    Currently in Pain? No/denies        OT TREATMENT    Neuro muscular re-education:  Pt. Worked on screwing, and unscrewing nuts and bolts. Pt. Requires verbal cues and visual demonstration to initiate and follow through.   Therapeutic Exercise:  Pt worked on the [employer] for UE strengthening for 8 min. Forward and reverse at level 3.0. Pt. Requires cues and constant monitoring to keep right hand on handle. Pt. Requires max cues to redirect hand back on handle. Pt. performed 2# dowel ex. For UE strengthening secondary to weakness. Bilateral shoulder flexion, chest press, circular patterns, and elbow flexion/extension were performed. Pt. Requires verbal and tactile cues, and cues for visual demonstration.  OT Education - 02/18/16 1658    Education Details UE stren   Person(s) Educated Patient   Methods Explanation             OT Long Term Goals - 02/16/16 1729    OT LONG TERM GOAL #1   Title Patient will complete bathing with modified independence.   Baseline independent   Time 8   Period Weeks   Status Achieved   OT LONG TERM GOAL #2   Title Patient will complete homemaking tasks of cleaning and laundry with modified independence.   Baseline moderate to max assist   Time 8   Period Weeks   Status Achieved   OT LONG TERM GOAL #3   Title Patient will increase bilateral UE strength to perform yard work tasks with supervision.   Baseline unable   Time 8    Period Weeks   Status On-going   OT LONG TERM GOAL #4   Title Patient will complete hot meal preparation with modified independence.   Baseline light meal prep currently.   Time 8   Period Weeks   Status Achieved               Plan - 02/18/16 1700    Clinical Impression Statement Pt. is making progress progress overall with his UE strength for IADL tasks. Pt. does continue to require constant cues to achieve the desired movement patterns. Pt. requires cues as he tends to get stuck in the middle of movements and tasks, requiiring cues to continue and for redirection.   Rehab Potential Good   Clinical Impairments Affecting Rehab Potential Patient has a previous crush injury to left side in which he has limited ROM and diminished use.    OT Frequency 2x / week   OT Duration 4 weeks   OT Treatment/Interventions Self-care/ADL training;Therapeutic exercise;Patient/family education;Neuromuscular education;Manual Therapy;Balance training;Therapeutic exercises;DME and/or AE instruction;Therapeutic activities;Cognitive remediation/compensation   Consulted and Agree with Plan of Care Patient      Patient will benefit from skilled therapeutic intervention in order to improve the following deficits and impairments:  Decreased activity tolerance, Decreased strength, Decreased coordination, Decreased safety awareness, Impaired UE functional use  Visit Diagnosis: Muscle weakness (generalized)  Other lack of coordination    Problem List Patient Active Problem List   Diagnosis Date Noted  . Confusion 10/18/2015  . Constipation 08/23/2015  . Uncontrolled type 2 diabetes mellitus with hyperglycemia, with long-term current use of insulin ([place]) 08/23/2015  . Essential hypertension 08/23/2015  . Right lower lobe pneumonia 08/23/2015  . Abdominal pain 08/21/2015  . Altered mental status 02/19/2015   Harrel Carina, MS, OTR/L  Harrel Carina 02/18/2016, 5:16 PM  [place] MAIN [facility] SERVICES [address] [place], [place], [address] Phone: [phone]   Fax:  [phone]  Name: Maurice Patterson MRN: [id number] Date of Birth: 1956-07-16

## 2016-02-18 NOTE — Patient Instructions (Signed)
OT TREATMENT    Neuro muscular re-education:  Pt. Worked on screwing, and unscrewing nuts and bolts. Pt. Requires verbal cues and visual demonstration to initiate and follow through.   Therapeutic Exercise:  Pt worked on the [employer] for UE strengthening for 8 min. Forward and reverse at level 3.0. Pt. Requires cues and constant monitoring to keep right hand on handle. Pt. Requires max cues to redirect hand back on handle. Pt. performed 2# dowel ex. For UE strengthening secondary to weakness. Bilateral shoulder flexion, chest press, circular patterns, and elbow flexion/extension were performed. Pt. Requires verbal and tactile cues, and cues for visual demonstration.

## 2016-02-19 DIAGNOSIS — M6281 Muscle weakness (generalized): Secondary | ICD-10-CM | POA: Diagnosis not present

## 2016-02-26 ENCOUNTER — Encounter: Payer: PPO | Admitting: Occupational Therapy

## 2016-02-27 ENCOUNTER — Ambulatory Visit: Payer: PPO | Attending: Family Medicine | Admitting: Occupational Therapy

## 2016-02-27 DIAGNOSIS — M6281 Muscle weakness (generalized): Secondary | ICD-10-CM | POA: Insufficient documentation

## 2016-02-27 DIAGNOSIS — R278 Other lack of coordination: Secondary | ICD-10-CM | POA: Diagnosis not present

## 2016-02-27 NOTE — Therapy (Signed)
[place] MAIN [facility] SERVICES [address] [place], [place], [address] Phone: [phone]   Fax:  [phone]  Occupational Therapy Treatment  Patient Details  Name: Maurice Patterson MRN: [id number] Date of Birth: 1956-04-22 Referring Provider: Dyanne Carrel  Encounter Date: 02/27/2016      OT End of Session - 02/27/16 1147    Visit Number 19   Number of Visits 24   Date for OT Re-Evaluation 03/15/16   Authorization Type Medicare g code 2 of 10   OT Start Time 1033   OT Stop Time 1120   OT Time Calculation (min) 47 min   Activity Tolerance Patient tolerated treatment well   Behavior During Therapy [facility] for tasks assessed/performed      Past Medical History  Diagnosis Date  . Seizures ([place])     staring spells  . Memory loss   . Arthritis   . Stroke ([place])   . Hypertension   . Diabetes mellitus without complication ([place])   . Malignant intraductal papillary mucinous tumor of pancreas ([place])   . Hernia of abdominal cavity   . Degenerative lumbar disc   . Acute MI ([place])   . TIA (transient ischemic attack)     Past Surgical History  Procedure Laterality Date  . Left arm metal plate    . Stent times 2  2013    Cardiac  . Left shoulder surgery    . Ercp N/A 09/12/2015    Procedure: ENDOSCOPIC RETROGRADE CHOLANGIOPANCREATOGRAPHY (ERCP);  Surgeon: Hulen Luster, MD;  Location: [facility] ENDOSCOPY;  Service: Gastroenterology;  Laterality: N/A;  . Ercp N/A 01/02/2016    Procedure: ENDOSCOPIC RETROGRADE CHOLANGIOPANCREATOGRAPHY (ERCP);  Surgeon: Hulen Luster, MD;  Location[facility] ENDOSCOPY;  Service: Gastroenterology;  Laterality: N/A;    There were no vitals filed for this visit.      Subjective Assessment - 02/27/16 1037    Subjective  Pt. reports back pain, and shoulder pain.   Patient is accompained by: Family member   Pertinent History Patient reports he had a headache and went [place] [facility] [place] [facility] and there was not  ventilation for the chorine and he felt sick and was transported to the hospital.  Wife reports he was given [place] and he was allergic and had a reaction at the hospital. Had shingles during his hospital stay.  Patient had a pancreatic stent placed on 09/13/2015 prior to the stroke and will have to have a follow up surgery for scar tissue on 01/02/2016.   Patient Stated Goals Patient reports he would like to drive again, he has not driven since having a pancreatic stent 09/13/2015.    Currently in Pain? No/denies   Pain Score 7   back, and left shoulder pain.         OT TREATMENT    Therapeutic Exercise:  Pt. Worked on red theraband ex. For strengthening of bilateral elbow flexion, and extension. Verbal and tactile were required. Pt. performed gross gripping with grip strengthener. Pt. worked on sustaining grip while grasping pegs and reaching at various heights. Pt. Worked with the gripper in the 3rd and 4th resistive slot with the white level resistive spring. Pt. Worked on the Pt. performed resistive EZ Board exercises for forearm supination/pronation, wrist flexion/extension using gross grasp, and lateral pinch (key) grasp. Pt. performed resistive EZ Board exercises angled in several planes to promote right shoulder flexion, abduction, and wrist flexion, and extension while performing resistive wrist flexion and  extension with a gross grip. Pt. Worked on pinch strengthening in the left hand for lateral, and 3pt. pinch using green and blue resistive clip, and placing them at various angles and heights.Merri Brunette and verbal cues were required for eliciting the desired movement. Verbal cues, and visual demonstration were also required.                        OT Education - 02/27/16 1146    Education provided Yes   Education Details RUE strength.   Person(s) Educated Patient   Methods Explanation   Comprehension Verbalized understanding             OT Long Term Goals  - 02/16/16 1729    OT LONG TERM GOAL #1   Title Patient will complete bathing with modified independence.   Baseline independent   Time 8   Period Weeks   Status Achieved   OT LONG TERM GOAL #2   Title Patient will complete homemaking tasks of cleaning and laundry with modified independence.   Baseline moderate to max assist   Time 8   Period Weeks   Status Achieved   OT LONG TERM GOAL #3   Title Patient will increase bilateral UE strength to perform yard work tasks with supervision.   Baseline unable   Time 8   Period Weeks   Status On-going   OT LONG TERM GOAL #4   Title Patient will complete hot meal preparation with modified independence.   Baseline light meal prep currently.   Time 8   Period Weeks   Status Achieved               Plan - 02/27/16 1156    Clinical Impression Statement Pt. continues to work on improving UE strength,  grip strength, and pinch strengthening in preparation for being able to do yardwork. Pt. requires consistent verbal and tactile cues, along with visual demonstration to initiate moveemnts, and for motor planning throughout the task.    Rehab Potential Good   Clinical Impairments Affecting Rehab Potential Patient has a previous crush injury to left side in which he has limited ROM and diminished use.    OT Frequency 2x / week   OT Duration 4 weeks   OT Treatment/Interventions Self-care/ADL training;Therapeutic exercise;Patient/family education;Neuromuscular education;Manual Therapy;Balance training;Therapeutic exercises;DME and/or AE instruction;Therapeutic activities;Cognitive remediation/compensation   Consulted and Agree with Plan of Care Patient   Family Member Consulted Son and wife.      Patient will benefit from skilled therapeutic intervention in order to improve the following deficits and impairments:  Decreased activity tolerance, Decreased strength, Decreased coordination, Decreased safety awareness, Impaired UE functional  use  Visit Diagnosis: Muscle weakness (generalized)    Problem List Patient Active Problem List   Diagnosis Date Noted  . Confusion 10/18/2015  . Constipation 08/23/2015  . Uncontrolled type 2 diabetes mellitus with hyperglycemia, with long-term current use of insulin ([place]) 08/23/2015  . Essential hypertension 08/23/2015  . Right lower lobe pneumonia 08/23/2015  . Abdominal pain 08/21/2015  . Altered mental status 02/19/2015   Harrel Carina, MS, OTR/L   Harrel Carina 02/27/2016, 12:11 PM  [place] MAIN [facility] SERVICES [address] [place], [place], [address] Phone: [phone]   Fax:  [phone]  Name: Maurice Patterson MRN: [id number] Date of Birth: 1956/02/06

## 2016-02-27 NOTE — Patient Instructions (Signed)
OT TREATMENT    Therapeutic Exercise:  Pt. Worked on red theraband ex. For strengthening of bilateral elbow flexion, and extension. Verbal and tactile were required. Pt. performed gross gripping with grip strengthener. Pt. worked on sustaining grip while grasping pegs and reaching at various heights. Pt. Worked with the gripper in the 3rd and 4th resistive slot with the white level resistive spring. Pt. Worked on the Pt. performed resistive EZ Board exercises for forearm supination/pronation, wrist flexion/extension using gross grasp, and lateral pinch (key) grasp. Pt. performed resistive EZ Board exercises angled in several planes to promote right shoulder flexion, abduction, and wrist flexion, and extension while performing resistive wrist flexion and extension with a gross grip. Pt. Worked on pinch strengthening in the left hand for lateral, and 3pt. pinch using green and blue resistive clip, and placing them at various angles and heights.Merri Brunette and verbal cues were required for eliciting the desired movement. Verbal cues, and visual demonstration were also required.

## 2016-03-04 ENCOUNTER — Ambulatory Visit: Payer: PPO | Admitting: Occupational Therapy

## 2016-03-04 DIAGNOSIS — M6281 Muscle weakness (generalized): Secondary | ICD-10-CM

## 2016-03-04 DIAGNOSIS — R278 Other lack of coordination: Secondary | ICD-10-CM

## 2016-03-04 NOTE — Therapy (Signed)
[place] MAIN [facility] SERVICES [address] [place], [place], [address] Phone: [phone]   Fax:  [phone]  Occupational Therapy Treatment  Patient Details  Name: Maurice Patterson MRN: [id number] Date of Birth: 1955/08/25 Referring Provider: Dyanne Carrel  Encounter Date: 03/04/2016      OT End of Session - 03/04/16 1722    Visit Number 20   Number of Visits 24   Authorization Type Medicare g code 2 of 10   OT Start Time 1616   OT Stop Time 1702   OT Time Calculation (min) 46 min   Activity Tolerance Patient tolerated treatment well   Behavior During Therapy [facility] for tasks assessed/performed      Past Medical History  Diagnosis Date  . Seizures ([place])     staring spells  . Memory loss   . Arthritis   . Stroke ([place])   . Hypertension   . Diabetes mellitus without complication ([place])   . Malignant intraductal papillary mucinous tumor of pancreas ([place])   . Hernia of abdominal cavity   . Degenerative lumbar disc   . Acute MI ([place])   . TIA (transient ischemic attack)     Past Surgical History  Procedure Laterality Date  . Left arm metal plate    . Stent times 2  2013    Cardiac  . Left shoulder surgery    . Ercp N/A 09/12/2015    Procedure: ENDOSCOPIC RETROGRADE CHOLANGIOPANCREATOGRAPHY (ERCP);  Surgeon: Hulen Luster, MD;  Location: [facility] ENDOSCOPY;  Service: Gastroenterology;  Laterality: N/A;  . Ercp N/A 01/02/2016    Procedure: ENDOSCOPIC RETROGRADE CHOLANGIOPANCREATOGRAPHY (ERCP);  Surgeon: Hulen Luster, MD;  Location: [facility] ENDOSCOPY;  Service: Gastroenterology;  Laterality: N/A;    There were no vitals filed for this visit.      Subjective Assessment - 03/04/16 1634    Subjective  "I fell backwards in a chair 2 days ago"   Patient is accompained by: Family member   Pertinent History Patient reports he fell backwards in a chair sitting in the kitchen and bumped his L knee and the back of his head.  His son helped him up and he was  felt fine and walked outside and sat down.   Patient Stated Goals Patient reports he would like to drive again, he has not driven since having a pancreatic stent 09/13/2015.    Currently in Pain? No/denies                      OT Treatments/Exercises (OP) - 03/04/16 0001    Fine Motor Coordination   Other Fine Motor Exercises Patient seen for fine motor tasks tabletop  to work on manipulation and moving objects from palm to fingers with cues for tech.   Other Fine Motor Exercises Patient seen for manipulation of larger sized velcro blocks to increase strength and following pattern which he had difficulty with and needed verbal and tactie cues.  With graded clothespins, he appeared to have depth perception issues when placing on laminated paper.   Neurological Re-education Exercises   Other Exercises 1 Patient seen for 1.5# dowel exercises for shoulder flexion, chest press and ABD/ADD with cues for control and placement.                OT Education - 03/04/16 1721    Education provided Yes   Education Details position of R UE for fine motor tasks   Person(s) Educated Patient   Methods  Explanation   Comprehension Verbalized understanding;Returned demonstration             OT Long Term Goals - 03/04/16 1726    OT LONG TERM GOAL #1   Period Weeks   OT LONG TERM GOAL #3   Title Patient will increase bilateral UE strength to perform yard work tasks with supervision.   Baseline unable   Time 8   Period Weeks   Status On-going               Plan - 03/04/16 1723    Clinical Impression Statement Pt continues to be motivated to increase functional use of hand for use in yard work and ADLs.  He demonstrated issues with depth perception today and will monitor in therapy and discuss with family.  He continues to require  verbal and tactile cues for motor planning and proper placement of UE for tasks.   Rehab Potential Good   OT Frequency 2x / week   OT  Duration 4 weeks   OT Treatment/Interventions Self-care/ADL training;Therapeutic exercise;Patient/family education;Neuromuscular education;Manual Therapy;Balance training;Therapeutic exercises;DME and/or AE instruction;Therapeutic activities;Cognitive remediation/compensation   Plan plan to continue working on increasing grip and pinch strength for yard work and ADLs.   Consulted and Agree with Plan of Care Patient   Family Member Consulted daughter      Patient will benefit from skilled therapeutic intervention in order to improve the following deficits and impairments:  Decreased activity tolerance, Decreased strength, Decreased coordination, Decreased safety awareness, Impaired UE functional use  Visit Diagnosis: Muscle weakness (generalized)  Other lack of coordination    Problem List Patient Active Problem List   Diagnosis Date Noted  . Confusion 10/18/2015  . Constipation 08/23/2015  . Uncontrolled type 2 diabetes mellitus with hyperglycemia, with long-term current use of insulin ([place]) 08/23/2015  . Essential hypertension 08/23/2015  . Right lower lobe pneumonia 08/23/2015  . Abdominal pain 08/21/2015  . Altered mental status 02/19/2015    Chrys Racer, OTR/L ascom [phone] 03/04/2016, 5:28 PM  [place] MAIN [facility] SERVICES [address] [place], [place], [address] Phone: [phone]   Fax:  [phone]  Name: Maurice Patterson MRN: [id number] Date of Birth: 31-Mar-1956

## 2016-03-04 NOTE — Patient Instructions (Signed)
Pt and daughter instructed to have him keep elbow and shoulder close to body and not abducted when using hand to manipulate objects.

## 2016-03-08 ENCOUNTER — Ambulatory Visit: Payer: PPO | Admitting: Occupational Therapy

## 2016-03-08 ENCOUNTER — Encounter: Payer: Self-pay | Admitting: Occupational Therapy

## 2016-03-08 DIAGNOSIS — M6281 Muscle weakness (generalized): Secondary | ICD-10-CM | POA: Diagnosis not present

## 2016-03-08 DIAGNOSIS — R278 Other lack of coordination: Secondary | ICD-10-CM

## 2016-03-08 NOTE — Therapy (Signed)
[place] MAIN [facility] SERVICES [address] [place], [place], [address] Phone: [phone]   Fax:  [phone]  Occupational Therapy Treatment  Patient Details  Name: Maurice Patterson MRN: [id number] Date of Birth: Apr 15, 1956 Referring Provider: Dyanne Carrel  Encounter Date: 03/08/2016      OT End of Session - 03/08/16 1647    Visit Number 21   Number of Visits 24   Date for OT Re-Evaluation 03/15/16   Authorization Type Medicare g code 3 of 10   OT Start Time 1500   OT Stop Time 1545   OT Time Calculation (min) 45 min   Activity Tolerance Patient tolerated treatment well   Behavior During Therapy [facility] for tasks assessed/performed      Past Medical History  Diagnosis Date  . Seizures ([place])     staring spells  . Memory loss   . Arthritis   . Stroke ([place])   . Hypertension   . Diabetes mellitus without complication ([place])   . Malignant intraductal papillary mucinous tumor of pancreas ([place])   . Hernia of abdominal cavity   . Degenerative lumbar disc   . Acute MI ([place])   . TIA (transient ischemic attack)     Past Surgical History  Procedure Laterality Date  . Left arm metal plate    . Stent times 2  2013    Cardiac  . Left shoulder surgery    . Ercp N/A 09/12/2015    Procedure: ENDOSCOPIC RETROGRADE CHOLANGIOPANCREATOGRAPHY (ERCP);  Surgeon: Hulen Luster, MD;  Location: [facility] ENDOSCOPY;  Service: Gastroenterology;  Laterality: N/A;  . Ercp N/A 01/02/2016    Procedure: ENDOSCOPIC RETROGRADE CHOLANGIOPANCREATOGRAPHY (ERCP);  Surgeon: Hulen Luster, MD;  Location: [facility] ENDOSCOPY;  Service: Gastroenterology;  Laterality: N/A;    There were no vitals filed for this visit.      Subjective Assessment - 03/08/16 1638    Subjective  "I am good.  I don't have any more falls to tell you about"   Patient is accompained by: Family member   Patient Stated Goals Patient reports he would like to drive again, he has not driven since having a pancreatic  stent 09/13/2015.    Currently in Pain? No/denies                      OT Treatments/Exercises (OP) - 03/08/16 1641    Fine Motor Coordination   Other Fine Motor Exercises Patient seen for integrating visual perceptual, problem solving and manipulation skills of R hand with L to complete and follow a diagram to build a pipe tree.  He had difficulty with visual discrimination of sizes of PVC pipe tubing and needed mod cues for correct choice.  Pt presents with decreased problem solving during task.  Pt is making good progress using hands together for functional tasks.      Neurological Re-education Exercises   Other Exercises 1 Pt seen for 3pt, 2pt, lateral and gross grasp and release to find marbles and small pegs in pink theraputty.  Tolerated for 10 minutes.                OT Education - 03/08/16 1646    Education provided Yes   Education Details recommended exercises to do at home with theraputty   Person(s) Educated Patient   Methods Explanation   Comprehension Verbalized understanding;Returned demonstration             OT Long Term Goals - 03/08/16 1652  OT LONG TERM GOAL #3   Title Patient will increase bilateral UE strength to perform yard work tasks with supervision.   Baseline unable   Time 8   Period Weeks   Status On-going               Plan - 03/08/16 1648    Clinical Impression Statement Pt continues to make progress with using R hand with L for functional activities. He demonstrated difficulty with a visual perceptual problem solving task for building a pipe tree today with cues needed to discriminate size of PVC pipe tubing.  He was not able to visually identify what was wrong and needed cues and choices to solve and complete task.   He continues to make progress with using hands together and work on increasing strength and fine motor control.  Rec he continue to use theraputty at home and reviewed exercises.     Rehab Potential  Good   Clinical Impairments Affecting Rehab Potential Patient has a previous crush injury to left side in which he has limited ROM and diminished use.    OT Frequency 2x / week   OT Duration 4 weeks   OT Treatment/Interventions Self-care/ADL training;Therapeutic exercise;Patient/family education;Neuromuscular education;Manual Therapy;Balance training;Therapeutic exercises;DME and/or AE instruction;Therapeutic activities;Cognitive remediation/compensation   Consulted and Agree with Plan of Care Patient      Patient will benefit from skilled therapeutic intervention in order to improve the following deficits and impairments:  Decreased activity tolerance, Decreased strength, Decreased coordination, Decreased safety awareness, Impaired UE functional use  Visit Diagnosis: Muscle weakness (generalized)  Other lack of coordination    Problem List Patient Active Problem List   Diagnosis Date Noted  . Confusion 10/18/2015  . Constipation 08/23/2015  . Uncontrolled type 2 diabetes mellitus with hyperglycemia, with long-term current use of insulin ([place]) 08/23/2015  . Essential hypertension 08/23/2015  . Right lower lobe pneumonia 08/23/2015  . Abdominal pain 08/21/2015  . Altered mental status 02/19/2015    Chrys Racer, OTR/L ascom [phone]  03/08/2016, 4:54 PM  [place] MAIN [facility] SERVICES [address] [place], [place], [address] Phone: [phone]   Fax:  [phone]  Name: Maurice Patterson MRN: [id number] Date of Birth: 1955/10/02

## 2016-03-11 ENCOUNTER — Ambulatory Visit: Payer: PPO | Admitting: Occupational Therapy

## 2016-03-11 DIAGNOSIS — R278 Other lack of coordination: Secondary | ICD-10-CM

## 2016-03-11 DIAGNOSIS — M6281 Muscle weakness (generalized): Secondary | ICD-10-CM

## 2016-03-11 NOTE — Patient Instructions (Signed)
OT TREATMENT    Neuro muscular re-education:  Pt. Worked on grasping 1/2" objects, and placing them in a single horizontal line. Pt. Was able to follow one step directions, but was unable to follow multiple or 2 step tasks. Pt. Was able to grasp the objects, however required extensive cues to place them.  Therapeutic Exercise:  Pt. Worked on the [employer] for UE strengthening, and reciprocal movement. Pt. Worked was able to complete for 8 min., but required extensive cues to change directions. Pt. performed gross gripping with grip strengthener. Pt. worked with gripper on the 3rd resistive slot. Pt. Required extensive cues, and constant reinforcement of the directions.   Goals were reviewed with pt.

## 2016-03-11 NOTE — Therapy (Signed)
[facility] REHAB SERVICES [address] [place], [place], [address] Phone: [phone]   Fax:  [phone]  Occupational Therapy Treatment/Discharge Summary  Patient Details  Name: Maurice Patterson MRN: [id number] Date of Birth: 12/07/1955 Referring Provider: [place]  Encounter Date: 03/11/2016      OT End of Session - 03/11/16 1646    Visit Number 22   Number of Visits 24   Date for OT Re-Evaluation 03/15/16   Authorization Type Medicare g code 4 of 10   OT Start Time 1620   OT Stop Time 1700   OT Time Calculation (min) 40 min   Activity Tolerance Patient tolerated treatment well   Behavior During Therapy [facility] for tasks assessed/performed      Past Medical History  Diagnosis Date  . Seizures ([facility])     staring spells  . Memory loss   . Arthritis   . Stroke ([facility])   . Hypertension   . Diabetes mellitus without complication ([facility])   . Malignant intraductal papillary mucinous tumor of pancreas ([facility])   . Hernia of abdominal cavity   . Degenerative lumbar disc   . Acute MI ([facility])   . TIA (transient ischemic attack)     Past Surgical History  Procedure Laterality Date  . Left arm metal plate    . Stent times 2  2013    Cardiac  . Left shoulder surgery    . Ercp N/A 09/12/2015    Procedure: ENDOSCOPIC RETROGRADE CHOLANGIOPANCREATOGRAPHY (ERCP);  Surgeon: Paul Y Oh, MD;  Location: [facility] ENDOSCOPY;  Service: Gastroenterology;  Laterality: N/A;  . Ercp N/A 01/02/2016    Procedure: ENDOSCOPIC RETROGRADE CHOLANGIOPANCREATOGRAPHY (ERCP);  Surgeon: Paul Y Oh, MD;  Location: [facility] ENDOSCOPY;  Service: Gastroenterology;  Laterality: N/A;    There were no vitals filed for this visit.      Subjective Assessment - 03/11/16 1644    Subjective  Pt. reports he had a near fall this week. Pt. wife reports being concerned that the patient is getting worse with retaining information at home. Pt. wife reports pt. is following up with Dr. Potter  regarding medication concerns.   Patient is accompained by: Family member   Pertinent History Patient reports he fell backwards in a chair sitting in the kitchen and bumped his L knee and the back of his head.  His son helped him up and he was felt fine and walked outside and sat down.   Patient Stated Goals Patient reports he would like to drive again, he has not driven since having a pancreatic stent 09/13/2015.    Currently in Pain? No/denies        OT TREATMENT    Neuro muscular re-education:  Pt. Worked on grasping 1/2" objects, and placing them in a single horizontal line. Pt. Was able to follow one step directions, but was unable to follow multiple or 2 step tasks. Pt. Was able to grasp the objects, however required extensive cues to place them.  Therapeutic Exercise:  Pt. Worked on the [employer] for UE strengthening, and reciprocal movement. Pt. Worked was able to complete for 8 min., but required extensive cues to change directions. Pt. performed gross gripping with grip strengthener. Pt. worked with gripper on the 3rd resistive slot. Pt. Required extensive cues, and constant reinforcement of the directions.   Goals were reviewed with pt.                            OT Education - 03/11/16 2329    Education provided Yes   Person(s) Educated Patient   Methods Explanation   Comprehension Verbalized understanding;Returned demonstration             OT Long Term Goals - 03/11/16 2329    OT LONG TERM GOAL #3   Title Patient will increase bilateral UE strength to perform yard work tasks with supervision.   Baseline unable   Time 8   Period Weeks   Status Partially Met               Plan - 03/11/16 2309    Clinical Impression Statement Pt. has made progress overall with basic ADl and IADL care, UE strength, and grip strength. Pt. continues to have difficulty with FMC skills. Pt. continues to require extensive cues for motor  planning, and  task  follow through requiring extensive visual, verbal, and tactile cues, and demonstration. Pt. has reached his max potential at this time requiring extensive and continuous cues.. Pt. wife  reports pt. has a follow-up appointment with the neurologist  about medication concerns.    Rehab Potential Good   Clinical Impairments Affecting Rehab Potential Patient has a previous crush injury to left side in which he has limited ROM and diminished use.    OT Frequency 2x / week   OT Duration 4 weeks   OT Treatment/Interventions Self-care/ADL training;Therapeutic exercise;Patient/family education;Neuromuscular education;Manual Therapy;Balance training;Therapeutic exercises;DME and/or AE instruction;Therapeutic activities;Cognitive remediation/compensation   Consulted and Agree with Plan of Care Patient   Family Member Consulted daughter      Patient will benefit from skilled therapeutic intervention in order to improve the following deficits and impairments:  Decreased activity tolerance, Decreased strength, Decreased coordination, Decreased safety awareness, Impaired UE functional use  Visit Diagnosis: Other lack of coordination  Muscle weakness (generalized)    Problem List Patient Active Problem List   Diagnosis Date Noted  . Confusion 10/18/2015  . Constipation 08/23/2015  . Uncontrolled type 2 diabetes mellitus with hyperglycemia, with long-term current use of insulin ([facility]) 08/23/2015  . Essential hypertension 08/23/2015  . Right lower lobe pneumonia 08/23/2015  . Abdominal pain 08/21/2015  . Altered mental status 02/19/2015    , MS, OTR/L    03/11/2016, 11:34 PM  [facility] MA[facility] REHAB SERVICES [address] [place], [place], [address] Phone: [phone]   Fax:  [phone]  Name: Jaquan D [place]rew MRN: [id number] Date of Birth: 06/24/1956   

## 2016-03-16 ENCOUNTER — Encounter: Payer: PPO | Admitting: Occupational Therapy

## 2016-03-18 ENCOUNTER — Encounter: Payer: PPO | Admitting: Occupational Therapy

## 2016-03-18 DIAGNOSIS — K8689 Other specified diseases of pancreas: Secondary | ICD-10-CM | POA: Diagnosis not present

## 2016-03-18 DIAGNOSIS — K869 Disease of pancreas, unspecified: Secondary | ICD-10-CM | POA: Diagnosis not present

## 2016-03-18 DIAGNOSIS — G479 Sleep disorder, unspecified: Secondary | ICD-10-CM | POA: Diagnosis not present

## 2016-03-18 DIAGNOSIS — R4189 Other symptoms and signs involving cognitive functions and awareness: Secondary | ICD-10-CM | POA: Diagnosis not present

## 2016-03-18 DIAGNOSIS — R739 Hyperglycemia, unspecified: Secondary | ICD-10-CM | POA: Diagnosis not present

## 2016-03-18 DIAGNOSIS — I10 Essential (primary) hypertension: Secondary | ICD-10-CM | POA: Diagnosis not present

## 2016-03-18 DIAGNOSIS — R404 Transient alteration of awareness: Secondary | ICD-10-CM | POA: Diagnosis not present

## 2016-03-19 DIAGNOSIS — Z794 Long term (current) use of insulin: Secondary | ICD-10-CM | POA: Diagnosis not present

## 2016-03-19 DIAGNOSIS — Z79899 Other long term (current) drug therapy: Secondary | ICD-10-CM | POA: Diagnosis not present

## 2016-03-19 DIAGNOSIS — E1165 Type 2 diabetes mellitus with hyperglycemia: Secondary | ICD-10-CM | POA: Diagnosis not present

## 2016-03-29 ENCOUNTER — Ambulatory Visit: Payer: PPO | Attending: Neurology | Admitting: Speech Pathology

## 2016-03-29 DIAGNOSIS — R4701 Aphasia: Secondary | ICD-10-CM | POA: Diagnosis not present

## 2016-03-29 DIAGNOSIS — R41841 Cognitive communication deficit: Secondary | ICD-10-CM

## 2016-03-29 DIAGNOSIS — R471 Dysarthria and anarthria: Secondary | ICD-10-CM | POA: Insufficient documentation

## 2016-03-30 ENCOUNTER — Encounter: Payer: Self-pay | Admitting: Speech Pathology

## 2016-03-30 NOTE — Therapy (Signed)
[place] MAIN [facility] SERVICES [address] [place], [place], [address] Phone: [phone]   Fax:  [phone]  Speech Language Pathology Evaluation  Patient Details  Name: Maurice Patterson MRN: [id number] Date of Birth: [birth year] Referring Provider: Dr. Melrose Nakayama  Encounter Date: 03/29/2016      End of Session - 03/30/16 1611    Visit Number 1   Number of Visits 25   Date for SLP Re-Evaluation 07/02/16   SLP Start Time 58   SLP Stop Time  1700   SLP Time Calculation (min) 60 min   Activity Tolerance Patient tolerated treatment well      Past Medical History:  Diagnosis Date  . Acute MI ([place])   . Arthritis   . Degenerative lumbar disc   . Diabetes mellitus without complication ([place])   . Hernia of abdominal cavity   . Hypertension   . Malignant intraductal papillary mucinous tumor of pancreas ([place])   . Memory loss   . Seizures ([place])    staring spells  . Stroke ([place])   . TIA (transient ischemic attack)     Past Surgical History:  Procedure Laterality Date  . ERCP N/A 09/12/2015   Procedure: ENDOSCOPIC RETROGRADE CHOLANGIOPANCREATOGRAPHY (ERCP);  Surgeon: Hulen Luster, MD;  Location: [facility] ENDOSCOPY;  Service: Gastroenterology;  Laterality: N/A;  . ERCP N/A 01/02/2016   Procedure: ENDOSCOPIC RETROGRADE CHOLANGIOPANCREATOGRAPHY (ERCP);  Surgeon: Hulen Luster, MD;  Location: [facility] ENDOSCOPY;  Service: Gastroenterology;  Laterality: N/A;  . left arm metal plate    . Left shoulder surgery    . Stent times 2  2013   Cardiac    There were no vitals filed for this visit.          SLP Evaluation [facility] - 03/30/16 0001      SLP Visit Information   SLP Received On 03/29/16   Referring Provider Dr. Melrose Nakayama   Onset Date 03/22/2016   Medical Diagnosis Aphasia/Confusion     Subjective   Subjective This 60 year old man; with complex medical history including stroke in February 2017, 7 week hospitalization, possible medication  allergy/response   Patient/Family Stated Goal Improve functional communication     Pain Assessment   Currently in Pain? No/denies     Cognition   Overall Cognitive Status Impaired/Different from baseline     Auditory Comprehension   Overall Auditory Comprehension Impaired     Reading Comprehension   Reading Status Impaired     Verbal Expression   Overall Verbal Expression Impaired     Oral Motor/Sensory Function   Overall Oral Motor/Sensory Function Appears within functional limits for tasks assessed     Motor Speech   Overall Motor Speech Appears within functional limits for tasks assessed     Standardized Assessments   Standardized Assessments  Cognitive Linguistic Quick Test;Western Aphasia Battery revised       Western Aphasia Battery- Screening  Spontaneous Speech      Information content   5/10       Fluency    5/10     Comprehension     Yes/No questions   8/10          Sequential Commands  1/10    Repetition    6/10     Naming    Object Naming   10/10       Screening Aphasia Quotient 58/100  Reading    Testing incomplete Writing    Not tested Screening Language Quotient  The Cognitive Linguistic Quick Test (CLQT) was administered to assess the relative status of five cognitive domains: attention, memory, language, executive functioning, and visuospatial skills. Scores from 10 tasks were used to estimate severity ratings (for age groups 18-69 years and 70-89 years) for each domain, a clock drawing task, as well as an overall composite severity rating of cognition.    Task    Score  Criterion Cut Scores Personal Facts  7/8   8  Symbol Cancellation     0/12   11 Confrontation Naming    10/10   10 Clock Drawing      0/13   12 Story Retelling       3/10   6 Symbol Trails      0/10   9 Generative Naming      1/9   5 Design Memory  2/6   5 Mazes        0/8   7 Design Generation    1/13   6  Cognitive Domain  Severity  Rating Attention   Severe  Memory   Severe Executive Function  Severe Language   Moderate Visuospatial Skills  Severe Clock Drawing   Severe  Composite Severity Rating Severe       SLP Education - 03/30/16 1610    Education provided Yes   Education Details Spoke with patient and wife RE: role of SLP    Person(s) Educated Patient;Spouse   Methods Explanation   Comprehension Verbalized understanding            SLP Long Term Goals - 03/30/16 1613      SLP LONG TERM GOAL #1   Title Patient will complete mid and high level word finding tasks with 80% accuracy.   Time 12   Period Weeks   Status New     SLP LONG TERM GOAL #2   Title Patient will generate grammatical and cogent sentence to complete simple/concrete linguistic task with 80% accuracy.   Time 12   Period Weeks   Status New     SLP LONG TERM GOAL #3   Title Patient will complete 2 unit processing tasks with 80% accuracy without the need of repetition of task instructions or significant delays in responding.   Time 12   Period Weeks   Status New     SLP LONG TERM GOAL #4   Title Patient will demonstrate reading comprehension for sentences and paragraphs with 80% accuracy.    Time 12   Period Weeks   Status New     SLP LONG TERM GOAL #5   Title Patient will identify communication and cognitive barriers and participate in developing functional compensatory strategies.   Time 12   Period Weeks   Status New          Plan - 03/30/16 1612    Clinical Impression Statement This 60 year old man; with complex medical history including stroke in February 2017, 7 week hospitalization, possible medication allergy/response; is testing with moderate aphasia and severe cognitive-communication impairment.  The results of the Cognitive Linguistic Quick Test (CLQT) indicate a composite severity rating of severe.  The patient demonstrates severe deficit in the domains of attention, memory, executive function, visuospatial  skills, and clock drawing and moderate deficit in the domain of language. Language screening with the Western Aphasia Battery-Screening reveals moderate aphasia.  The patient will benefit from skilled speech therapy for restorative and compensatory treatment of language and cognitive-communication deficit.   Speech Therapy Frequency 2x /  week   Duration Other (comment)  12 weeks   Potential to Achieve Goals Good   Potential Considerations Ability to learn/carryover information;Co-morbidities;Cooperation/participation level;Medical prognosis;Previous level of function;Severity of impairments;Family/community support   SLP Home Exercise Plan TBD   Consulted and Agree with Plan of Care Patient;Family member/caregiver   Family Member Consulted Spouse      Patient will benefit from skilled therapeutic intervention in order to improve the following deficits and impairments:   Aphasia - Plan: SLP plan of care cert/re-cert  Cognitive communication deficit - Plan: SLP plan of care cert/re-cert      G-Codes - [id number]    Functional Assessment Tool Used Western Aphasia Battery-Revised, Cognitive Linguistic Quick Test, clinical judgment   Functional Limitations Spoken language expressive   Spoken Language Expression Current Status [phone]) At least 40 percent but less than 60 percent impaired, limited or restricted   Spoken Language Expression Goal Status [id number]) At least 1 percent but less than 20 percent impaired, limited or restricted      Problem List Patient Active Problem List   Diagnosis Date Noted  . Confusion 10/18/2015  . Constipation 08/23/2015  . Uncontrolled type 2 diabetes mellitus with hyperglycemia, with long-term current use of insulin ([place]) 08/23/2015  . Essential hypertension 08/23/2015  . Right lower lobe pneumonia 08/23/2015  . Abdominal pain 08/21/2015  . Altered mental status 02/19/2015   Leroy Sea, MS/CCC- SLP  Lou Miner 03/30/2016, 4:22  PM  [place] MAIN [facility] SERVICES [address] [place], [place], [address] Phone: [phone]   Fax:  [phone]  Name: ORTON WINTER MRN: [id number] Date of Birth: 08/16/1956

## 2016-03-31 ENCOUNTER — Ambulatory Visit: Payer: PPO | Admitting: Speech Pathology

## 2016-03-31 DIAGNOSIS — R41841 Cognitive communication deficit: Secondary | ICD-10-CM

## 2016-03-31 DIAGNOSIS — R4701 Aphasia: Secondary | ICD-10-CM

## 2016-04-01 ENCOUNTER — Encounter: Payer: Self-pay | Admitting: Speech Pathology

## 2016-04-01 NOTE — Therapy (Signed)
[place] MAIN [facility] SERVICES [address] [place], [place], [address] Phone: [phone]   Fax:  [phone]  Speech Language Pathology Treatment  Patient Details  Name: Maurice Patterson MRN: [id number] Date of Birth: 08/16/1956 Referring Provider: Dr. Melrose Nakayama  Encounter Date: 03/31/2016      End of Session - 04/01/16 1431    Visit Number 2   Number of Visits 25   Date for SLP Re-Evaluation 07/02/16   SLP Start Time [id number]   SLP Stop Time  1650   SLP Time Calculation (min) 60 min   Activity Tolerance Patient tolerated treatment well      Past Medical History:  Diagnosis Date  . Acute MI ([place])   . Arthritis   . Degenerative lumbar disc   . Diabetes mellitus without complication ([place])   . Hernia of abdominal cavity   . Hypertension   . Malignant intraductal papillary mucinous tumor of pancreas ([place])   . Memory loss   . Seizures ([place])    staring spells  . Stroke ([place])   . TIA (transient ischemic attack)     Past Surgical History:  Procedure Laterality Date  . ERCP N/A 09/12/2015   Procedure: ENDOSCOPIC RETROGRADE CHOLANGIOPANCREATOGRAPHY (ERCP);  Surgeon: Hulen Luster, MD;  Location: [facility] ENDOSCOPY;  Service: Gastroenterology;  Laterality: N/A;  . ERCP N/A 01/02/2016   Procedure: ENDOSCOPIC RETROGRADE CHOLANGIOPANCREATOGRAPHY (ERCP);  Surgeon: Hulen Luster, MD;  Location: [facility] ENDOSCOPY;  Service: Gastroenterology;  Laterality: N/A;  . left arm metal plate    . Left shoulder surgery    . Stent times 2  2013   Cardiac    There were no vitals filed for this visit.      Subjective Assessment - 04/01/16 1430    Subjective The patient appears less confused today   Currently in Pain? No/denies               ADULT SLP TREATMENT - 04/01/16 0001      General Information   Behavior/Cognition Alert;Cooperative;Pleasant mood;Confused     Treatment Provided   Treatment provided Cognitive-Linquistic     Pain Assessment   Pain  Assessment No/denies pain     Cognitive-Linquistic Treatment   Treatment focused on Aphasia;Cognition   Skilled Treatment VERBAL EXPRESSION: Generate cogent/grammatical sentence given picture stimulus with 50% accuracy.  Errors are unrecognized semantic paraphasia, grammatical errors, and word finding errors.  Name item given verbal definition with 50% accuracy; improves to 60% given prompt to try again and 80% given SLP cues.  AUDITORY COMPREHENSION:  easily completes 2 unit processing tasks.     Assessment / Recommendations / Plan   Plan Continue with current plan of care     Progression Toward Goals   Progression toward goals Progressing toward goals          SLP Education - 04/01/16 1431    Education provided Yes   Education Details Speak slower and louder   Person(s) Educated Patient   Methods Explanation   Comprehension Verbalized understanding            SLP Long Term Goals - 03/30/16 1613      SLP LONG TERM GOAL #1   Title Patient will complete mid and high level word finding tasks with 80% accuracy.   Time 12   Period Weeks   Status New     SLP LONG TERM GOAL #2   Title Patient will generate grammatical and cogent sentence to complete simple/concrete  linguistic task with 80% accuracy.   Time 12   Period Weeks   Status New     SLP LONG TERM GOAL #3   Title Patient will complete 2 unit processing tasks with 80% accuracy without the need of repetition of task instructions or significant delays in responding.   Time 12   Period Weeks   Status New     SLP LONG TERM GOAL #4   Title Patient will demonstrate reading comprehension for sentences and paragraphs with 80% accuracy.    Time 12   Period Weeks   Status New     SLP LONG TERM GOAL #5   Title Patient will identify communication and cognitive barriers and participate in developing functional compensatory strategies.   Time 12   Period Weeks   Status New          Plan - 04/01/16 1431    Clinical  Impression Statement The patient appears less confused today, overall.  Patient is requiring cues to slow speech sufficiently to be understood.   Speech Therapy Frequency 2x / week   Duration Other (comment)   Treatment/Interventions Multimodal communcation approach;Functional tasks;Internal/external aids;Cognitive reorganization;SLP instruction and feedback;Patient/family education;Other (comment)  Aphasia treatment   Potential to Achieve Goals Good   Potential Considerations Ability to learn/carryover information;Co-morbidities;Cooperation/participation level;Medical prognosis;Previous level of function;Severity of impairments;Family/community support   SLP Home Exercise Plan word finding worksheet   Consulted and Agree with Plan of Care Patient      Patient will benefit from skilled therapeutic intervention in order to improve the following deficits and impairments:   Aphasia  Cognitive communication deficit    Problem List Patient Active Problem List   Diagnosis Date Noted  . Confusion 10/18/2015  . Constipation 08/23/2015  . Uncontrolled type 2 diabetes mellitus with hyperglycemia, with long-term current use of insulin ([place]) 08/23/2015  . Essential hypertension 08/23/2015  . Right lower lobe pneumonia 08/23/2015  . Abdominal pain 08/21/2015  . Altered mental status 02/19/2015   Leroy Sea, MS/CCC- SLP  Lou Miner 04/01/2016, 2:33 PM  [place] MAIN [facility] SERVICES [address] [place], [place], [address] Phone: [phone]   Fax:  [phone]   Name: WACEY VARNES MRN: [id number] Date of Birth: February 23, 1956

## 2016-04-04 ENCOUNTER — Emergency Department: Payer: PPO

## 2016-04-04 ENCOUNTER — Encounter: Payer: Self-pay | Admitting: Emergency Medicine

## 2016-04-04 ENCOUNTER — Observation Stay
Admission: EM | Admit: 2016-04-04 | Discharge: 2016-04-05 | Disposition: A | Discharge status: Home / Self Care | Payer: PPO | Attending: Internal Medicine | Admitting: Internal Medicine

## 2016-04-04 DIAGNOSIS — Z833 Family history of diabetes mellitus: Secondary | ICD-10-CM | POA: Insufficient documentation

## 2016-04-04 DIAGNOSIS — E11649 Type 2 diabetes mellitus with hypoglycemia without coma: Secondary | ICD-10-CM | POA: Diagnosis not present

## 2016-04-04 DIAGNOSIS — Z79899 Other long term (current) drug therapy: Secondary | ICD-10-CM | POA: Insufficient documentation

## 2016-04-04 DIAGNOSIS — Z794 Long term (current) use of insulin: Secondary | ICD-10-CM | POA: Diagnosis not present

## 2016-04-04 DIAGNOSIS — R079 Chest pain, unspecified: Secondary | ICD-10-CM | POA: Diagnosis not present

## 2016-04-04 DIAGNOSIS — J9811 Atelectasis: Secondary | ICD-10-CM | POA: Diagnosis not present

## 2016-04-04 DIAGNOSIS — Z955 Presence of coronary angioplasty implant and graft: Secondary | ICD-10-CM | POA: Diagnosis not present

## 2016-04-04 DIAGNOSIS — Z885 Allergy status to narcotic agent status: Secondary | ICD-10-CM | POA: Insufficient documentation

## 2016-04-04 DIAGNOSIS — C259 Malignant neoplasm of pancreas, unspecified: Secondary | ICD-10-CM | POA: Insufficient documentation

## 2016-04-04 DIAGNOSIS — I2510 Atherosclerotic heart disease of native coronary artery without angina pectoris: Secondary | ICD-10-CM | POA: Diagnosis not present

## 2016-04-04 DIAGNOSIS — G8929 Other chronic pain: Secondary | ICD-10-CM | POA: Diagnosis not present

## 2016-04-04 DIAGNOSIS — R413 Other amnesia: Secondary | ICD-10-CM | POA: Insufficient documentation

## 2016-04-04 DIAGNOSIS — Z888 Allergy status to other drugs, medicaments and biological substances status: Secondary | ICD-10-CM | POA: Diagnosis not present

## 2016-04-04 DIAGNOSIS — R197 Diarrhea, unspecified: Secondary | ICD-10-CM | POA: Insufficient documentation

## 2016-04-04 DIAGNOSIS — Z820 Family history of epilepsy and other diseases of the nervous system: Secondary | ICD-10-CM | POA: Diagnosis not present

## 2016-04-04 DIAGNOSIS — R739 Hyperglycemia, unspecified: Secondary | ICD-10-CM | POA: Diagnosis not present

## 2016-04-04 DIAGNOSIS — K5900 Constipation, unspecified: Secondary | ICD-10-CM | POA: Diagnosis not present

## 2016-04-04 DIAGNOSIS — Z8249 Family history of ischemic heart disease and other diseases of the circulatory system: Secondary | ICD-10-CM | POA: Diagnosis not present

## 2016-04-04 DIAGNOSIS — Z880 Allergy status to penicillin: Secondary | ICD-10-CM | POA: Diagnosis not present

## 2016-04-04 DIAGNOSIS — E1165 Type 2 diabetes mellitus with hyperglycemia: Secondary | ICD-10-CM | POA: Diagnosis not present

## 2016-04-04 DIAGNOSIS — Z87891 Personal history of nicotine dependence: Secondary | ICD-10-CM | POA: Insufficient documentation

## 2016-04-04 DIAGNOSIS — R918 Other nonspecific abnormal finding of lung field: Secondary | ICD-10-CM | POA: Diagnosis not present

## 2016-04-04 DIAGNOSIS — Z8673 Personal history of transient ischemic attack (TIA), and cerebral infarction without residual deficits: Secondary | ICD-10-CM | POA: Diagnosis not present

## 2016-04-04 DIAGNOSIS — I252 Old myocardial infarction: Secondary | ICD-10-CM | POA: Insufficient documentation

## 2016-04-04 DIAGNOSIS — I10 Essential (primary) hypertension: Secondary | ICD-10-CM | POA: Diagnosis not present

## 2016-04-04 DIAGNOSIS — J189 Pneumonia, unspecified organism: Secondary | ICD-10-CM | POA: Diagnosis not present

## 2016-04-04 DIAGNOSIS — E785 Hyperlipidemia, unspecified: Secondary | ICD-10-CM | POA: Insufficient documentation

## 2016-04-04 DIAGNOSIS — Z7982 Long term (current) use of aspirin: Secondary | ICD-10-CM | POA: Insufficient documentation

## 2016-04-04 DIAGNOSIS — R569 Unspecified convulsions: Secondary | ICD-10-CM | POA: Insufficient documentation

## 2016-04-04 DIAGNOSIS — M1990 Unspecified osteoarthritis, unspecified site: Secondary | ICD-10-CM | POA: Diagnosis not present

## 2016-04-04 LAB — LIPASE, BLOOD: Lipase: 11 U/L (ref 11–51)

## 2016-04-04 LAB — COMPREHENSIVE METABOLIC PANEL
ALT: 42 U/L (ref 17–63)
AST: 38 U/L (ref 15–41)
Albumin: 3.8 g/dL (ref 3.5–5.0)
Alkaline Phosphatase: 192 U/L — ABNORMAL HIGH (ref 38–126)
Anion gap: 5 (ref 5–15)
BUN: 8 mg/dL (ref 6–20)
CO2: 26 mmol/L (ref 22–32)
Calcium: 8.8 mg/dL — ABNORMAL LOW (ref 8.9–10.3)
Chloride: 109 mmol/L (ref 101–111)
Creatinine, Ser: 0.87 mg/dL (ref 0.61–1.24)
GFR calc Af Amer: >60 mL/min (ref >60)
GFR calc non Af Amer: >60 mL/min (ref >60)
Glucose, Bld: 45 mg/dL — ABNORMAL LOW (ref 65–99)
Potassium: 3.7 mmol/L (ref 3.5–5.1)
Sodium: 140 mmol/L (ref 135–145)
Total Bilirubin: 0.5 mg/dL (ref 0.3–1.2)
Total Protein: 6.9 g/dL (ref 6.5–8.1)

## 2016-04-04 LAB — CBC WITH DIFFERENTIAL/PLATELET
BASOS ABS: 0 10*3/uL (ref 0–0.1)
BASOS PCT: 0 %
Eosinophils Absolute: 0.1 10*3/uL (ref 0–0.7)
Eosinophils Relative: 1 %
HCT: 44.2 % (ref 40.0–52.0)
Hemoglobin: 15 g/dL (ref 13.0–18.0)
LYMPHS PCT: 24 %
Lymphs Abs: 2.4 10*3/uL (ref 1.0–3.6)
MCH: 32 pg (ref 26.0–34.0)
MCHC: 33.9 g/dL (ref 32.0–36.0)
MCV: 94.4 fL (ref 80.0–100.0)
MONO ABS: 0.9 10*3/uL (ref 0.2–1.0)
Monocytes Relative: 9 %
Neutro Abs: 6.5 10*3/uL (ref 1.4–6.5)
Neutrophils Relative %: 66 %
PLATELETS: 215 10*3/uL (ref 150–440)
RBC: 4.68 MIL/uL (ref 4.40–5.90)
RDW: 14 % (ref 11.5–14.5)
WBC: 9.9 10*3/uL (ref 3.8–10.6)

## 2016-04-04 LAB — URINALYSIS COMPLETE WITH MICROSCOPIC (ARMC ONLY)
BACTERIA UA: NONE SEEN
Bilirubin Urine: NEGATIVE
GLUCOSE, UA: 50 mg/dL — AB
Hgb urine dipstick: NEGATIVE
Ketones, ur: NEGATIVE mg/dL
Leukocytes, UA: NEGATIVE
Nitrite: NEGATIVE
PROTEIN: NEGATIVE mg/dL
RBC / HPF: NONE SEEN RBC/hpf (ref 0–5)
SPECIFIC GRAVITY, URINE: 1.004 — AB (ref 1.005–1.030)
SQUAMOUS EPITHELIAL / LPF: NONE SEEN
pH: 6 (ref 5.0–8.0)

## 2016-04-04 LAB — GLUCOSE, CAPILLARY
GLUCOSE-CAPILLARY: 115 mg/dL — AB (ref 65–99)
GLUCOSE-CAPILLARY: 39 mg/dL — AB (ref 65–99)
Glucose-Capillary: 73 mg/dL (ref 65–99)
Glucose-Capillary: 150 mg/dL — ABNORMAL HIGH (ref 65–99)

## 2016-04-04 LAB — TROPONIN I: Troponin I: <0.03 ng/mL (ref <0.03)

## 2016-04-04 MED ORDER — DEXTROSE 50 % IV SOLN
1.0000 | Freq: Once | INTRAVENOUS | Status: AC
  Administered 2016-04-04: 50 mL via INTRAVENOUS

## 2016-04-04 MED ORDER — DEXTROSE 50 % IV SOLN
INTRAVENOUS | Status: AC
Start: 2016-04-04 — End: 2016-04-05
  Filled 2016-04-04: qty 50

## 2016-04-04 MED ORDER — LEVOFLOXACIN IN D5W 750 MG/150ML IV SOLN
750.0000 mg | Freq: Once | INTRAVENOUS | Status: AC
  Administered 2016-04-04: 750 mg via INTRAVENOUS
  Filled 2016-04-04: qty 150

## 2016-04-04 NOTE — ED Triage Notes (Signed)
Pt presents to ED with mid abd pain that radiates straight through to his back. Onset approx 20 min ago while at the grocery store. Diaphoretic. Similar symptoms when pt had his last MI in July. Pt wife states pt had recent admission at [facility] and has been altered since. Pt quiet during triage, occasionally falling asleep and staring off while being questioned. Wife states his behavior his not normal per his baseline.

## 2016-04-04 NOTE — ED Provider Notes (Signed)
[facility] Emergency Department Provider Note   ____________________________________________   First MD Initiated Contact with Patient 04/04/16 2103     (approximate)  I have reviewed the triage vital signs and the nursing notes.   HISTORY  Chief Complaint Abdominal Pain    HPI Maurice Patterson is a 60 y.o. male complicated medical history including coronary artery disease, hypertension, pancreatic tumor, diabetes who presents for evaluation of hypoglycemia and generalized weakness today, gradual onset just before arrival, constant, severe, improved slightly after eating a donut. Patient was at the grocery store when he began feeling generally weak, wife noted that he was diaphoretic and pale. She tried to feed him a doughnut after which he "perked up a little". He also had an episode of chest pain at that time. No nausea, vomiting, diarrhea, fevers or chills. He has otherwise been in his usual state of health.   Past Medical History:  Diagnosis Date  . Acute MI ([place])   . Arthritis   . Degenerative lumbar disc   . Diabetes mellitus without complication ([place])   . Hernia of abdominal cavity   . Hypertension   . Malignant intraductal papillary mucinous tumor of pancreas ([place])   . Memory loss   . Seizures ([place])    staring spells  . Stroke ([place])   . TIA (transient ischemic attack)     Patient Active Problem List   Diagnosis Date Noted  . Confusion 10/18/2015  . Constipation 08/23/2015  . Uncontrolled type 2 diabetes mellitus with hyperglycemia, with long-term current use of insulin ([place]) 08/23/2015  . Essential hypertension 08/23/2015  . Right lower lobe pneumonia 08/23/2015  . Abdominal pain 08/21/2015  . Altered mental status 02/19/2015    Past Surgical History:  Procedure Laterality Date  . ERCP N/A 09/12/2015   Procedure: ENDOSCOPIC RETROGRADE CHOLANGIOPANCREATOGRAPHY (ERCP);  Surgeon: Hulen Luster, MD;  Location: [facility] ENDOSCOPY;  Service:  Gastroenterology;  Laterality: N/A;  . ERCP N/A 01/02/2016   Procedure: ENDOSCOPIC RETROGRADE CHOLANGIOPANCREATOGRAPHY (ERCP);  Surgeon: Hulen Luster, MD;  Location: [facility] ENDOSCOPY;  Service: Gastroenterology;  Laterality: N/A;  . left arm metal plate    . Left shoulder surgery    . Stent times 2  2013   Cardiac    Prior to Admission medications   Medication Sig Start Date End Date Taking? Authorizing Provider  Acetaminophen 500 MG coapsule Take 500 mg by mouth 2 (two) times daily.   Yes Historical Provider, MD  aspirin 81 MG tablet Take 81 mg by mouth daily.   Yes Historical Provider, MD  atorvastatin (LIPITOR) 80 MG tablet Take 1 tablet by mouth daily. 03/12/15  Yes Historical Provider, MD  cholestyramine Lucrezia Starch) 4 GM/DOSE powder Take 4 g by mouth 6 (six) times daily.   Yes Historical Provider, MD  clopidogrel (PLAVIX) 75 MG tablet Take 1 tablet by mouth daily.   Yes Historical Provider, MD  diphenoxylate-atropine (LOMOTIL) 2.5-0.025 MG tablet Take 1 tablet by mouth 4 (four) times daily as needed for diarrhea or loose stools. Patient taking differently: Take 1 tablet by mouth 4 (four) times daily as needed for diarrhea or loose stools. Wife reports takes 6 per day 05/27/15 05/26/16 Yes Joanne Gavel, MD  furosemide (LASIX) 40 MG tablet Take 1 tablet by mouth daily.   Yes Historical Provider, MD  guaiFENesin-codeine 100-10 MG/5ML syrup 5-10 ml po qhs prn 08/08/15  Yes Norval Gable, MD  insulin aspart (NOVOLOG FLEXPEN) 100 UNIT/ML FlexPen Inject 30 Units into the  skin 3 (three) times daily.  03/18/15  Yes Historical Provider, MD  insulin glargine (LANTUS) 100 UNIT/ML injection Inject 0.8 mLs (80 Units total) into the skin at bedtime. 08/23/15  Yes Theodoro Grist, MD  lamoTRIgine (LAMICTAL) 100 MG tablet Take 100 mg by mouth 2 (two) times daily.   Yes Historical Provider, MD  losartan (COZAAR) 100 MG tablet Take 1 tablet by mouth daily. 04/28/14  Yes Historical Provider, MD  Melatonin 3 MG TABS Take  3 mg by mouth.   Yes Historical Provider, MD  metoprolol succinate (TOPROL-XL) 25 MG 24 hr tablet Take 1 tablet by mouth daily.   Yes Historical Provider, MD  nitroGLYCERIN (NITROSTAT) 0.4 MG SL tablet Place 1 tablet under the tongue every 5 (five) minutes as needed for chest pain.    Yes Historical Provider, MD  omeprazole (PRILOSEC) 20 MG capsule Take 1 capsule by mouth daily. 05/20/15  Yes Historical Provider, MD  potassium chloride (K-DUR) 10 MEQ tablet Take 10 mEq by mouth 2 (two) times daily.   Yes Historical Provider, MD  levofloxacin (LEVAQUIN) 500 MG tablet Take 1 tablet (500 mg total) by mouth daily. Patient not taking: Reported on 04/04/2016 08/23/15   Theodoro Grist, MD  polyethylene glycol (MIRALAX / GLYCOLAX) packet Take 17 g by mouth daily. Patient not taking: Reported on 12/19/2015 08/23/15   Theodoro Grist, MD    Allergies Hydrocodone; Morphine; Ambien [zolpidem]; Benadryl [diphenhydramine hcl (sleep)]; Brilinta [ticagrelor]; Flexeril [cyclobenzaprine]; Haldol [haloperidol lactate]; Levetiracetam; Lorazepam; Risperdal [risperidone]; Trazodone; and Penicillins  Family History  Problem Relation Age of Onset  . Diabetes Mother   . Hypertension Mother   . Alzheimer's disease Father     Social History Social History  Substance Use Topics  . Smoking status: Former Research scientist (life sciences)  . Smokeless tobacco: Never Used     Comment: used to smoke 2PD for 40 yrs, quit about 4 years ago  . Alcohol use No     Comment: occasional    Review of Systems Constitutional: No fever/chills Eyes: No visual changes. ENT: No sore throat. Cardiovascular: + chest pain. Respiratory: Denies shortness of breath. Gastrointestinal: No abdominal pain.  No nausea, no vomiting.  No diarrhea.  No constipation. Genitourinary: Negative for dysuria. Musculoskeletal: Negative for back pain. Skin: Negative for rash. Neurological: Negative for headaches, focal weakness or numbness.  10-point ROS otherwise  negative.  ____________________________________________   PHYSICAL EXAM:  VITAL SIGNS: ED Triage Vitals  Enc Vitals Group     BP 04/04/16 2101 (!) 163/71     Pulse Rate 04/04/16 2101 99     Resp 04/04/16 2101 18     Temp 04/04/16 2101 97.7 F (36.5 C)     Temp Source 04/04/16 2101 Oral     SpO2 04/04/16 2101 100 %     Weight 04/04/16 2102 142 lb (64.4 kg)     Height 04/04/16 2102 5\' 6"  (1.676 m)     Head Circumference --      Peak Flow --      Pain Score 04/04/16 2102 0     Pain Loc --      Pain Edu? --      Excl. in [place]? --     Constitutional: Alert and oriented.Nontoxic - appearing and in no acute distress. Eyes: Conjunctivae are normal. PERRL. EOMI. Head: Atraumatic. Nose: No congestion/rhinnorhea. Mouth/Throat: Mucous membranes are moist.  Oropharynx non-erythematous. Neck: No stridor. Supple without meningismus. Cardiovascular: Normal rate, regular rhythm. Grossly normal heart sounds.  Good peripheral circulation. Respiratory:  Normal respiratory effort.  No retractions. Lungs CTAB. Diminished breath sounds in bilateral bases. Gastrointestinal: Soft and nontender. No distention. No CVA tenderness. Genitourinary: Deferred Musculoskeletal: No lower extremity tenderness nor edema.  No joint effusions. Neurologic:  Normal speech and language. No gross focal neurologic deficits are appreciated.  Skin:  Skin is warm, dry and intact. No rash noted. Psychiatric: Mood and affect are normal. Speech and behavior are normal.  ____________________________________________   LABS (all labs ordered are listed, but only abnormal results are displayed)  Labs Reviewed  COMPREHENSIVE METABOLIC PANEL - Abnormal; Notable for the following:       Result Value   Glucose, Bld 45 (*)    Calcium 8.8 (*)    Alkaline Phosphatase 192 (*)    All other components within normal limits  URINALYSIS COMPLETEWITH MICROSCOPIC ([facility] ONLY) - Abnormal; Notable for the following:    Color, Urine  STRAW (*)    APPearance CLEAR (*)    Glucose, UA 50 (*)    Specific Gravity, Urine 1.004 (*)    All other components within normal limits  GLUCOSE, CAPILLARY - Abnormal; Notable for the following:    Glucose-Capillary 39 (*)    All other components within normal limits  GLUCOSE, CAPILLARY - Abnormal; Notable for the following:    Glucose-Capillary 150 (*)    All other components within normal limits  GLUCOSE, CAPILLARY - Abnormal; Notable for the following:    Glucose-Capillary 115 (*)    All other components within normal limits  CULTURE, BLOOD (ROUTINE X 2)  CULTURE, BLOOD (ROUTINE X 2)  GLUCOSE, CAPILLARY  CBC WITH DIFFERENTIAL/PLATELET  LIPASE, BLOOD  TROPONIN I  CBG MONITORING, ED   ____________________________________________  EKG  ED ECG REPORT I, Joanne Gavel, the attending physician, personally viewed and interpreted this ECG.   Date: 04/04/2016  EKG Time: 20:50  Rate: 97  Rhythm: normal sinus rhythm  Axis: normal  Intervals:none  ST&T Change: No acute ST elevation or acute ST depression.  ____________________________________________  RADIOLOGY  CXR IMPRESSION: Mild left basilar airspace opacity may reflect atelectasis or pneumonia. ____________________________________________   PROCEDURES  Procedure(s) performed: None  Procedures  Critical Care performed: No  ____________________________________________   INITIAL IMPRESSION / ASSESSMENT AND PLAN / ED COURSE  Pertinent labs & imaging results that were available during my care of the patient were reviewed by me and considered in my medical decision making (see chart for details).  Maurice Patterson is a 60 y.o. male complicated medical history including coronary artery disease, hypertension, pancreatic tumor, diabetes who presents for evaluation of hypoglycemia and generalized weakness today. On Exam, he is nontoxic appearing, his vital signs are stable. He has diminished breath sounds in  bilateral bases. His glucose on arrival was 70 however this was rechecked and it had decreased to 39. After an ampule of D50, it has increased to 150, we'll continue to monitor with every hour glucose checks. We will obtain screening labs, chest x-ray, urinalysis, reassess for disposition.  ----------------------------------------- 11:45 PM on 04/04/2016 -----------------------------------------  Repeat blood sugar able at 115. I reviewed the patient's labs. CBC unremarkable, CMP showed glucose of 45 however that was before he received D50. Normal lipase. Urinalysis is not consistent with infection. Chest x-rays concerning for pneumonia. We'll treat with Levaquin, case discussed with hospitalist for admission at this time.  Clinical Course     ____________________________________________   FINAL CLINICAL IMPRESSION(S) / ED DIAGNOSES  Final diagnoses:  Chest pain, unspecified chest pain type  Community  acquired pneumonia  Hyperglycemia      NEW MEDICATIONS STARTED DURING THIS VISIT:  New Prescriptions   No medications on file     Note:  This document was prepared using Dragon voice recognition software and may include unintentional dictation errors.    Joanne Gavel, MD 04/04/16 [phone]

## 2016-04-04 NOTE — ED Notes (Signed)
Pt's POCT CBG 39 MD Gayle informed. MD ordered 1 amp D50 and 4 ounces orange juice. D50 and orange juice given. MD gayle to bedside

## 2016-04-04 NOTE — ED Notes (Signed)
Pt's wife says they were at the grocery store when pt began having pain to upper abdomen that radiates through to his back; "broke out in a sweat"; pt with  History of MI and says this feels similar

## 2016-04-04 NOTE — ED Notes (Signed)
Pt has significant past medical history (cardiac, diabetes, pancreas - in chart). Pt had occurrence of chest and abdominal pain at grocery store at approx 2030 today. Pt became diaphoretic and c/o medial abdominal and back pain, as well as chest pain that radiated to neck and left arm. Pt's wife gave pt nitroglycerin and chest pain resolved. Pt's wife gave pt donut at grocery store, and proceeded immediatly to ER.   Pt present to ED slightly somnolent, but easily awoken. Pt would let wife answer questions unless RN specifically posed question to pt. Pts CBG was checked in triage, and was 72. Pt's CBG was check and low (39). RN notified MD and gave meds/juice. MD came to bedside. Pt's wife reports pts sugars are normally 400-700.

## 2016-04-05 ENCOUNTER — Ambulatory Visit: Payer: PPO | Admitting: Speech Pathology

## 2016-04-05 DIAGNOSIS — R079 Chest pain, unspecified: Secondary | ICD-10-CM | POA: Diagnosis present

## 2016-04-05 DIAGNOSIS — J159 Unspecified bacterial pneumonia: Secondary | ICD-10-CM | POA: Diagnosis not present

## 2016-04-05 DIAGNOSIS — E162 Hypoglycemia, unspecified: Secondary | ICD-10-CM | POA: Diagnosis not present

## 2016-04-05 LAB — LIPID PANEL
CHOL/HDL RATIO: 1.8 ratio
Cholesterol: 65 mg/dL (ref 0–200)
HDL: 37 mg/dL — AB (ref >40)
LDL CALC: 21 mg/dL (ref 0–99)
TRIGLYCERIDES: 35 mg/dL (ref <150)
VLDL: 7 mg/dL (ref 0–40)

## 2016-04-05 LAB — GLUCOSE, CAPILLARY
GLUCOSE-CAPILLARY: 181 mg/dL — AB (ref 65–99)
GLUCOSE-CAPILLARY: 255 mg/dL — AB (ref 65–99)
Glucose-Capillary: 308 mg/dL — ABNORMAL HIGH (ref 65–99)

## 2016-04-05 LAB — BASIC METABOLIC PANEL
Anion gap: 5 (ref 5–15)
BUN: 6 mg/dL (ref 6–20)
CALCIUM: 8 mg/dL — AB (ref 8.9–10.3)
CO2: 23 mmol/L (ref 22–32)
CREATININE: 0.7 mg/dL (ref 0.61–1.24)
Chloride: 108 mmol/L (ref 101–111)
GFR calc Af Amer: >60 mL/min (ref >60)
GLUCOSE: 346 mg/dL — AB (ref 65–99)
Potassium: 3.9 mmol/L (ref 3.5–5.1)
Sodium: 136 mmol/L (ref 135–145)

## 2016-04-05 LAB — APTT: aPTT: 29 seconds (ref 24–36)

## 2016-04-05 LAB — PROTIME-INR
INR: 1.08
PROTHROMBIN TIME: 14 s (ref 11.4–15.2)

## 2016-04-05 LAB — CBC
HEMATOCRIT: 39.7 % — AB (ref 40.0–52.0)
Hemoglobin: 13.3 g/dL (ref 13.0–18.0)
MCH: 32 pg (ref 26.0–34.0)
MCHC: 33.6 g/dL (ref 32.0–36.0)
MCV: 95.3 fL (ref 80.0–100.0)
Platelets: 203 10*3/uL (ref 150–440)
RBC: 4.17 MIL/uL — ABNORMAL LOW (ref 4.40–5.90)
RDW: 13.9 % (ref 11.5–14.5)
WBC: 6.8 10*3/uL (ref 3.8–10.6)

## 2016-04-05 LAB — TROPONIN I
Troponin I: <0.03 ng/mL (ref <0.03)
Troponin I: <0.03 ng/mL (ref <0.03)

## 2016-04-05 LAB — HEMOGLOBIN A1C: Hgb A1c MFr Bld: 8.9 % — ABNORMAL HIGH (ref 4.0–6.0)

## 2016-04-05 LAB — TSH: TSH: 1.063 u[IU]/mL (ref 0.350–4.500)

## 2016-04-05 MED ORDER — PANTOPRAZOLE SODIUM 40 MG PO TBEC
40.0000 mg | DELAYED_RELEASE_TABLET | Freq: Every day | ORAL | Status: DC
  Administered 2016-04-05: 40 mg via ORAL
  Filled 2016-04-05: qty 1

## 2016-04-05 MED ORDER — DEXTROSE 50 % IV SOLN: 1.0000 | Freq: Once | INTRAVENOUS | Status: DC | PRN

## 2016-04-05 MED ORDER — ENOXAPARIN SODIUM 40 MG/0.4ML ~~LOC~~ SOLN: 40.0000 mg | SUBCUTANEOUS | Status: DC

## 2016-04-05 MED ORDER — LAMOTRIGINE 25 MG PO TABS
100.0000 mg | ORAL_TABLET | Freq: Two times a day (BID) | ORAL | Status: DC
  Administered 2016-04-05 (×2): 100 mg via ORAL
  Filled 2016-04-05 (×2): qty 4

## 2016-04-05 MED ORDER — SODIUM CHLORIDE 0.9% FLUSH
3.0000 mL | Freq: Two times a day (BID) | INTRAVENOUS | Status: DC
  Administered 2016-04-05: 3 mL via INTRAVENOUS

## 2016-04-05 MED ORDER — ASPIRIN 81 MG PO CHEW
81.0000 mg | CHEWABLE_TABLET | Freq: Every day | ORAL | Status: DC
Start: 2016-04-05 — End: 2016-04-05
  Administered 2016-04-05: 81 mg via ORAL
  Filled 2016-04-05: qty 1

## 2016-04-05 MED ORDER — INSULIN ASPART 100 UNIT/ML ~~LOC~~ SOLN
0.0000 [IU] | Freq: Three times a day (TID) | SUBCUTANEOUS | Status: DC
  Administered 2016-04-05: 8 [IU] via SUBCUTANEOUS
  Administered 2016-04-05: 6 [IU] via SUBCUTANEOUS
  Filled 2016-04-05: qty 8
  Filled 2016-04-05: qty 6

## 2016-04-05 MED ORDER — ATORVASTATIN CALCIUM 20 MG PO TABS
80.0000 mg | ORAL_TABLET | Freq: Every day | ORAL | Status: DC
  Administered 2016-04-05: 80 mg via ORAL
  Filled 2016-04-05: qty 4

## 2016-04-05 MED ORDER — INSULIN NPH (HUMAN) (ISOPHANE) 100 UNIT/ML ~~LOC~~ SUSP: 25.0000 [IU] | Freq: Two times a day (BID) | SUBCUTANEOUS | 11 refills | Status: DC

## 2016-04-05 MED ORDER — INSULIN DETEMIR 100 UNIT/ML ~~LOC~~ SOLN
22.0000 [IU] | Freq: Two times a day (BID) | SUBCUTANEOUS | Status: DC
  Filled 2016-04-05 (×2): qty 0.22

## 2016-04-05 MED ORDER — INSULIN GLARGINE 100 UNIT/ML ~~LOC~~ SOLN: 80.0000 [IU] | Freq: Every day | SUBCUTANEOUS | Status: DC

## 2016-04-05 MED ORDER — CHOLESTYRAMINE 4 G PO PACK
4.0000 g | PACK | Freq: Every day | ORAL | Status: DC
  Administered 2016-04-05 (×2): 4 g via ORAL
  Filled 2016-04-05 (×6): qty 1

## 2016-04-05 MED ORDER — LEVOFLOXACIN 500 MG PO TABS: 750.0000 mg | ORAL_TABLET | Freq: Once | ORAL | Status: DC

## 2016-04-05 MED ORDER — ONDANSETRON HCL 4 MG PO TABS: 4.0000 mg | ORAL_TABLET | Freq: Four times a day (QID) | ORAL | Status: DC | PRN

## 2016-04-05 MED ORDER — INSULIN NPH (HUMAN) (ISOPHANE) 100 UNIT/ML ~~LOC~~ SUSP: 22.0000 [IU] | Freq: Two times a day (BID) | SUBCUTANEOUS | Status: DC

## 2016-04-05 MED ORDER — SODIUM CHLORIDE 0.9 % IV SOLN
INTRAVENOUS | Status: DC
  Administered 2016-04-05 (×2): via INTRAVENOUS

## 2016-04-05 MED ORDER — METOPROLOL SUCCINATE ER 25 MG PO TB24
25.0000 mg | ORAL_TABLET | Freq: Every day | ORAL | Status: DC
  Administered 2016-04-05: 25 mg via ORAL
  Filled 2016-04-05: qty 1

## 2016-04-05 MED ORDER — IPRATROPIUM-ALBUTEROL 0.5-2.5 (3) MG/3ML IN SOLN: 3.0000 mL | Freq: Four times a day (QID) | RESPIRATORY_TRACT | Status: DC | PRN

## 2016-04-05 MED ORDER — NITROGLYCERIN 0.4 MG SL SUBL: 0.4000 mg | SUBLINGUAL_TABLET | SUBLINGUAL | Status: DC | PRN

## 2016-04-05 MED ORDER — FUROSEMIDE 40 MG PO TABS
40.0000 mg | ORAL_TABLET | Freq: Every day | ORAL | Status: DC
  Administered 2016-04-05: 40 mg via ORAL
  Filled 2016-04-05: qty 1

## 2016-04-05 MED ORDER — ACETAMINOPHEN 325 MG PO TABS
650.0000 mg | ORAL_TABLET | Freq: Four times a day (QID) | ORAL | Status: DC | PRN
  Administered 2016-04-05: 650 mg via ORAL
  Filled 2016-04-05: qty 2

## 2016-04-05 MED ORDER — CLOPIDOGREL BISULFATE 75 MG PO TABS
75.0000 mg | ORAL_TABLET | Freq: Every day | ORAL | Status: DC
  Administered 2016-04-05: 75 mg via ORAL
  Filled 2016-04-05: qty 1

## 2016-04-05 MED ORDER — LOSARTAN POTASSIUM 50 MG PO TABS
100.0000 mg | ORAL_TABLET | Freq: Every day | ORAL | Status: DC
  Administered 2016-04-05: 100 mg via ORAL
  Filled 2016-04-05: qty 2

## 2016-04-05 MED ORDER — POTASSIUM CHLORIDE CRYS ER 10 MEQ PO TBCR
10.0000 meq | EXTENDED_RELEASE_TABLET | Freq: Two times a day (BID) | ORAL | Status: DC
  Administered 2016-04-05: 10 meq via ORAL
  Filled 2016-04-05: qty 1

## 2016-04-05 MED ORDER — INSULIN REGULAR HUMAN 100 UNIT/ML IJ SOLN: 30.0000 [IU] | Freq: Three times a day (TID) | INTRAMUSCULAR | 1 refills | Status: DC

## 2016-04-05 MED ORDER — ALBUTEROL SULFATE HFA 108 (90 BASE) MCG/ACT IN AERS: 2.0000 | INHALATION_SPRAY | Freq: Four times a day (QID) | RESPIRATORY_TRACT | 1 refills | Status: DC | PRN

## 2016-04-05 MED ORDER — ONDANSETRON HCL 4 MG/2ML IJ SOLN: 4.0000 mg | Freq: Four times a day (QID) | INTRAMUSCULAR | Status: DC | PRN

## 2016-04-05 MED ORDER — DIPHENOXYLATE-ATROPINE 2.5-0.025 MG PO TABS: 1.0000 | ORAL_TABLET | Freq: Four times a day (QID) | ORAL | Status: DC | PRN

## 2016-04-05 MED ORDER — ACETAMINOPHEN 650 MG RE SUPP: 650.0000 mg | Freq: Four times a day (QID) | RECTAL | Status: DC | PRN

## 2016-04-05 NOTE — ED Notes (Signed)
Admitting MD at bedside.

## 2016-04-05 NOTE — Progress Notes (Signed)
Inpatient Diabetes Program Recommendations  AACE/ADA: New Consensus Statement on Inpatient Glycemic Control (2015)  Target Ranges:  Prepandial:   less than 140 mg/dL      Peak postprandial:   less than 180 mg/dL (1-2 hours)      Critically ill patients:  140 - 180 mg/dL  Results for Maurice Patterson, Maurice Patterson (MRN [id number]) as of 04/05/2016 08:53  Ref. Range 04/04/2016 21:59 04/04/2016 22:19 04/04/2016 23:32 04/05/2016 01:10 04/05/2016 07:42  Glucose-Capillary Latest Ref Range: 65 - 99 mg/dL 39 (LL) 150 (H) 115 (H) 181 (H) 308 (H)    Review of Glycemic Control  Diabetes history: DM2Outpatient Diabetes medications: NPH 30 units BID, Regular 34 units TID with meals plus additional per correction scale (200-249 mg/dl 8 units, 250-299 [id number] units, 300-349 mg/dl 24 units, 350-399 mg/dl 32 units, >400 mg/dl 40 units) Current orders for Inpatient glycemic control: Novolog 0-24 units ACHS  Inpatient Diabetes Program Recommendations: Insulin - Basal: Patient was admitted initially with hypoglycemia which has resolved. Fasting glucose up to 308 mg/dl this morning. Please consider ordering Lantus 30 units Q24H starting now. Outpatient Referral: In reviewing the chart, noted patient is followed by Dr. Graceann Congress and last seen by her on 03/19/16 at which time patient was switched from Lantus and Novolog to NPH and Regular due to being in the doughnut hole and inability to afford Lantus and Novolog.   Thanks, Barnie Alderman, RN, MSN, CDE Diabetes Coordinator Inpatient Diabetes Program [phone] (Team Pager from [place] to [place]) [phone] ([place] office) [phone] [facility] office) [phone] [facility] office)

## 2016-04-05 NOTE — Evaluation (Signed)
Physical Therapy Evaluation Patient Details Name: Maurice Patterson MRN: [id number] DOB: 09-05-1955 Today's Date: 04/05/2016   History of Present Illness  presented to ER seconadry to chest pain, diaphoresis; admitted with CAP and hypoglycemia.    Clinical Impression  Patient currently able to complete bed mobility indep; sit/stand, basic transfers and gait (600') without assist device, distant sup/mod indep.  Good gait mechanics without LOB or significant safety concern.  Completes 10' walk distance in 6-7 seconds; scores 10/12 on modified DGI.  Mild higher-level balance deficits (with head turns or sudden stopping), though patient able to self-correct without assist from therapist.  Both patient/family report mobility performance is baseline for patient and deny any acute change in functional ability. Sats >97% on RA at rest and with exertion throughout session. No acute PT needs identified at this time; will complete initial order.  Please re-consult should needs change.    Follow Up Recommendations No PT follow up    Equipment Recommendations       Recommendations for Other Services       Precautions / Restrictions Precautions Precautions: Fall Restrictions Weight Bearing Restrictions: No      Mobility  Bed Mobility Overal bed mobility: Independent                Transfers Overall transfer level: Modified independent Equipment used: None                Ambulation/Gait Ambulation/Gait assistance: Supervision;Modified independent (Device/Increase time) Ambulation Distance (Feet): 600 Feet Assistive device: None   Gait velocity: 10' walk time, 6-7 seconds   General Gait Details: reciprocal stepping pattern with fair step height/length, good trunk rotation and arm swing.  Good cadence, gait speed; able to modulate gait speed on command without LOB or safety concern.  Stairs            Wheelchair Mobility    Modified Rankin (Stroke Patients Only)        Balance Overall balance assessment: Needs assistance Sitting-balance support: No upper extremity supported;Feet supported Sitting balance-Leahy Scale: Normal     Standing balance support: No upper extremity supported Standing balance-Leahy Scale: Good                   Standardized Balance Assessment Standardized Balance Assessment :  (Modified DGI: 10/12 (mild deficit in stopping suddenly, mild sway with lateral head turns))           Pertinent Vitals/Pain Pain Assessment: No/denies pain    Home Living Family/patient expects to be discharged to:: Private residence Living Arrangements: Spouse/significant other;Children (wife and son coordinate 24/7 care) Available Help at Discharge: Family;Available 24 hours/day Type of Home: House Home Access: Stairs to enter Entrance Stairs-Rails: None Entrance Stairs-Number of Steps: 3 Home Layout: One level        Prior Function Level of Independence: Independent               Hand Dominance        Extremity/Trunk Assessment   Upper Extremity Assessment: Overall WFL for tasks assessed           Lower Extremity Assessment: Overall WFL for tasks assessed (grossly at least 4+/5 throughout; no significant sensory deficits noted)         Communication   Communication: Expressive difficulties (mumbling at times; word-finding difficulties evident (esp on command))  Cognition Arousal/Alertness: Awake/alert Behavior During Therapy: WFL for tasks assessed/performed Overall Cognitive Status: History of cognitive impairments - at baseline mild apraxia noted with object use/demonstration  General Comments      Exercises        Assessment/Plan    PT Assessment Patent does not need any further PT services  PT Diagnosis     PT Problem List    PT Treatment Interventions     PT Goals (Current goals can be found in the Care Plan section) Acute Rehab PT Goals PT Goal  Formulation: All assessment and education complete, DC therapy    Frequency     Barriers to discharge        Co-evaluation               End of Session Equipment Utilized During Treatment: Gait belt Activity Tolerance: Patient tolerated treatment well Patient left: in bed;with call bell/phone within reach;with family/visitor present      Functional Assessment Tool Used: 10' walk time, Modified DGI Functional Limitation: Mobility: Walking and moving around Mobility: Walking and Moving Around Current Status [id number]): At least 1 percent but less than 20 percent impaired, limited or restricted Mobility: Walking and Moving Around Goal Status [phone]): At least 1 percent but less than 20 percent impaired, limited or restricted Mobility: Walking and Moving Around Discharge Status [phone]): At least 1 percent but less than 20 percent impaired, limited or restricted    Time: 1156-1212 PT Time Calculation (min) (ACUTE ONLY): 16 min   Charges:   PT Evaluation $PT Eval Low Complexity: 1 Procedure     PT G Codes:   PT G-Codes **NOT FOR INPATIENT CLASS** Functional Assessment Tool Used: 10' walk time, Modified DGI Functional Limitation: Mobility: Walking and moving around Mobility: Walking and Moving Around Current Status [id number]): At least 1 percent but less than 20 percent impaired, limited or restricted Mobility: Walking and Moving Around Goal Status [phone]): At least 1 percent but less than 20 percent impaired, limited or restricted Mobility: Walking and Moving Around Discharge Status [phone]): At least 1 percent but less than 20 percent impaired, limited or restricted    Kendrea [place] H. Owens Shark, PT, DPT, NCS 04/05/16, 1:58 PM [phone]

## 2016-04-05 NOTE — ED Notes (Signed)
Brought pt sandwich tray with MD Dahlia Client approval.

## 2016-04-05 NOTE — H&P (Addendum)
[place] @ [facility] Admission History and Physical Harvie Bridge, D.O.  ---------------------------------------------------------------------------------------------------------------------   PATIENT NAME: Maurice Patterson MR#: [id number] DATE OF BIRTH: [birth year] DATE OF ADMISSION: 04/04/2016 PRIMARY CARE PHYSICIAN: [name], MD  REQUESTING/REFERRING PHYSICIAN: ED Dr. Edd Fabian  CHIEF COMPLAINT: Chief Complaint  Patient presents with  . Abdominal Pain    HISTORY OF PRESENT ILLNESS: Maurice Patterson is a 60 y.o. male with a known history of Diabetes, coronary artery disease, hypertension, malignant tumor of the pancreas, seizures, stroke was in a usual state of health until this afternoon when his wife found him to be held, diaphoretic while driving in the car. She states that she provided him with a doughnut and his symptoms improved. He began to walk around the grocery store felt better but then progressively began to feel increasingly weak. On questioning the patient reports only that he has chest pain which is chronic.  The wife denies any recent complaints such as fevers, chills, dysuria, abdominal pain, cough, nausea, vomiting. He does have chronic diarrhea likely secondary to medications. There has been no change in the quality or caliber of stool.  Per wife patient has had a very long and complicated history beginning in February 2017 when he was admitted here for chest pain, he became acutely confused, required transfer to [facility] where a long workup ensued.  Ultimately was deemed that his symptoms were a medication side effect. Apparently while he was there he sustained a stroke and has had a long rehabilitation and recovery. He still suffers with confusion, memory loss, some speech difficulty.  Of note he has also been diagnosed with a malignant pancreatic tumor about one year ago but the couple to hospital course earlier in this year has prevented the  initiation of treatment. He does have pancreatic stent.  Otherwise there has been no change in status. Patient has been taking medication as prescribed and there has been no recent change in medication or diet.  There has been no recent illness, travel or sick contacts.    Patient denies fevers/chills, weakness, dizziness, chest pain, shortness of breath, N/V/C/D, abdominal pain, dysuria/frequency, changes in mental status.   EMS/ED COURSE:   Patient received Levaquin.  PAST MEDICAL HISTORY: Past Medical History:  Diagnosis Date  . Acute MI ([place])   . Arthritis   . Degenerative lumbar disc   . Diabetes mellitus without complication ([place])   . Hernia of abdominal cavity   . Hypertension   . Malignant intraductal papillary mucinous tumor of pancreas ([place])   . Memory loss   . Seizures ([place])    staring spells  . Stroke ([place])   . TIA (transient ischemic attack)       PAST SURGICAL HISTORY: Past Surgical History:  Procedure Laterality Date  . ERCP N/A 09/12/2015   Procedure: ENDOSCOPIC RETROGRADE CHOLANGIOPANCREATOGRAPHY (ERCP);  Surgeon: Hulen Luster, MD;  Location: [facility] ENDOSCOPY;  Service: Gastroenterology;  Laterality: N/A;  . ERCP N/A 01/02/2016   Procedure: ENDOSCOPIC RETROGRADE CHOLANGIOPANCREATOGRAPHY (ERCP);  Surgeon: Hulen Luster, MD;  Location: [facility] ENDOSCOPY;  Service: Gastroenterology;  Laterality: N/A;  . left arm metal plate    . Left shoulder surgery    . Stent times 2  2013   Cardiac      SOCIAL HISTORY: Social History  Substance Use Topics  . Smoking status: Former Research scientist (life sciences)  . Smokeless tobacco: Never Used     Comment: used to smoke 2PD for 40 yrs, quit about 4 years ago  .  Alcohol use No     Comment: occasional      FAMILY HISTORY: Family History  Problem Relation Age of Onset  . Diabetes Mother   . Hypertension Mother   . Alzheimer's disease Father      MEDICATIONS AT HOME: Prior to Admission medications   Medication Sig Start Date End Date Taking?  Authorizing Provider  Acetaminophen 500 MG coapsule Take 500 mg by mouth 2 (two) times daily.   Yes Historical Provider, MD  aspirin 81 MG tablet Take 81 mg by mouth daily.   Yes Historical Provider, MD  atorvastatin (LIPITOR) 80 MG tablet Take 1 tablet by mouth daily. 03/12/15  Yes Historical Provider, MD  cholestyramine Lucrezia Starch) 4 GM/DOSE powder Take 4 g by mouth 6 (six) times daily.   Yes Historical Provider, MD  clopidogrel (PLAVIX) 75 MG tablet Take 1 tablet by mouth daily.   Yes Historical Provider, MD  diphenoxylate-atropine (LOMOTIL) 2.5-0.025 MG tablet Take 1 tablet by mouth 4 (four) times daily as needed for diarrhea or loose stools. Patient taking differently: Take 1 tablet by mouth 4 (four) times daily as needed for diarrhea or loose stools. Wife reports takes 6 per day 05/27/15 05/26/16 Yes Joanne Gavel, MD  furosemide (LASIX) 40 MG tablet Take 1 tablet by mouth daily.   Yes Historical Provider, MD  guaiFENesin-codeine 100-10 MG/5ML syrup 5-10 ml po qhs prn 08/08/15  Yes Norval Gable, MD  insulin aspart (NOVOLOG FLEXPEN) 100 UNIT/ML FlexPen Inject 30 Units into the skin 3 (three) times daily.  03/18/15  Yes Historical Provider, MD  insulin glargine (LANTUS) 100 UNIT/ML injection Inject 0.8 mLs (80 Units total) into the skin at bedtime. 08/23/15  Yes Theodoro Grist, MD  lamoTRIgine (LAMICTAL) 100 MG tablet Take 100 mg by mouth 2 (two) times daily.   Yes Historical Provider, MD  losartan (COZAAR) 100 MG tablet Take 1 tablet by mouth daily. 04/28/14  Yes Historical Provider, MD  Melatonin 3 MG TABS Take 3 mg by mouth.   Yes Historical Provider, MD  metoprolol succinate (TOPROL-XL) 25 MG 24 hr tablet Take 1 tablet by mouth daily.   Yes Historical Provider, MD  nitroGLYCERIN (NITROSTAT) 0.4 MG SL tablet Place 1 tablet under the tongue every 5 (five) minutes as needed for chest pain.    Yes Historical Provider, MD  omeprazole (PRILOSEC) 20 MG capsule Take 1 capsule by mouth daily. 05/20/15  Yes  Historical Provider, MD  potassium chloride (K-DUR) 10 MEQ tablet Take 10 mEq by mouth 2 (two) times daily.   Yes Historical Provider, MD  levofloxacin (LEVAQUIN) 500 MG tablet Take 1 tablet (500 mg total) by mouth daily. Patient not taking: Reported on 04/04/2016 08/23/15   Theodoro Grist, MD  polyethylene glycol (MIRALAX / GLYCOLAX) packet Take 17 g by mouth daily. Patient not taking: Reported on 12/19/2015 08/23/15   Theodoro Grist, MD      DRUG ALLERGIES: Allergies  Allergen Reactions  . Hydrocodone Anaphylaxis  . Morphine Other (See Comments)  . Ambien [Zolpidem] Other (See Comments)    delirium    . Benadryl [Diphenhydramine Hcl (Sleep)]   . Brilinta [Ticagrelor] Other (See Comments)    Stroke   . Flexeril [Cyclobenzaprine] Other (See Comments)    delerium   . Haldol [Haloperidol Lactate] Other (See Comments)    delerium   . Levetiracetam Diarrhea and Other (See Comments)    Unable to walk  . Lorazepam Hives  . Risperdal [Risperidone] Other (See Comments)    Delirium   .  Trazodone Other (See Comments)  . Penicillins Rash    Has patient had a PCN reaction causing immediate rash, facial/tongue/throat swelling, SOB or lightheadedness with hypotension: Yes Has patient had a PCN reaction causing severe rash involving mucus membranes or skin necrosis: No Has patient had a PCN reaction that required hospitalization No Has patient had a PCN reaction occurring within the last 10 years: Yes If all of the above answers are "NO", then may proceed with Cephalosporin use.     REVIEW OF SYSTEMS: CONSTITUTIONAL: No fever/chills, fatigue, weakness, weight gain/loss, headache EYES: No blurry or double vision. ENT: No tinnitus, postnasal drip, redness or soreness of the oropharynx. RESPIRATORY: No cough, wheeze, hemoptysis, dyspnea. CARDIOVASCULAR:  Positive  chest pain, negative orthopnea, palpitations, syncope. GASTROINTESTINAL: No nausea, vomiting, constipation, diarrhea, abdominal  pain, hematemesis, melena or hematochezia. GENITOURINARY: No dysuria or hematuria. ENDOCRINE: No polyuria or nocturia. No heat or cold intolerance. HEMATOLOGY: No anemia, bruising, bleeding. INTEGUMENTARY: No rashes, ulcers, lesions. MUSCULOSKELETAL: No arthritis, swelling, gout. NEUROLOGIC: No numbness, tingling, weakness or ataxia. No seizure-type activity. PSYCHIATRIC: No anxiety, depression, insomnia.  PHYSICAL EXAMINATION: VITAL SIGNS: Blood pressure 128/81, pulse 95, temperature 97.7 F (36.5 C), temperature source Oral, resp. rate 19, height 5\' 6"  (1.676 m), weight 64.4 kg (142 lb), SpO2 99 %.  GENERAL: 60 y.o.-year-old white male patient, well-developed, well-nourished lying in the bed in no acute distress.  Pleasant and cooperative.  Slightly confused but alert and oriented 3. HEENT: Head atraumatic, normocephalic. Pupils equal, round, reactive to light and accommodation. No scleral icterus. Extraocular muscles intact. Nares are patent. Oropharynx is clear. Mucus membranes moist. NECK: Supple, full range of motion. No JVD, no bruit heard. No thyroid enlargement, no tenderness, no cervical lymphadenopathy. CHEST: Decreased breath sounds bilateral bases however there is decreased infiltrate effort. No appreciable wheezing, rales, rhonchi or crackles. No use of accessory muscles of respiration.  No reproducible chest wall tenderness.  CARDIOVASCULAR: S1, S2 normal. Loud systolic ejection murmur at left sternal border rubs, or gallops. Cap refill <2 seconds. ABDOMEN: Soft, nontender, nondistended. No rebound, guarding, rigidity. Normoactive bowel sounds present in all four quadrants. No organomegaly or mass. EXTREMITIES: Full range of motion. No pedal edema, cyanosis, or clubbing. NEUROLOGIC: Speech is somewhat sluggish and weak Cranial nerves II through XII are grossly intact with no focal sensorimotor deficit. Muscle strength 5/5 in all extremities. Sensation intact. Gait not  checked. PSYCHIATRIC: The patient is alert and oriented x 3. Normal affect, mood, thought content. SKIN: Warm, dry, and intact without obvious rash, lesion, or ulcer.  LABORATORY PANEL:  CBC  Recent Labs Lab 04/04/16 2140  WBC 9.9  HGB 15.0  HCT 44.2  PLT 215   ----------------------------------------------------------------------------------------------------------------- Chemistries  Recent Labs Lab 04/04/16 2140  NA 140  K 3.7  CL 109  CO2 26  GLUCOSE 45*  BUN 8  CREATININE 0.87  CALCIUM 8.8*  AST 38  ALT 42  ALKPHOS 192*  BILITOT 0.5   ------------------------------------------------------------------------------------------------------------------ Cardiac Enzymes  Recent Labs Lab 04/04/16 2140  TROPONINI <0.03   ------------------------------------------------------------------------------------------------------------------  RADIOLOGY: Dg Chest 2 View  Result Date: 04/04/2016 CLINICAL DATA:  Acute onset of mid abdominal pain, radiating to the back. Diaphoresis. Initial encounter. EXAM: CHEST  2 VIEW COMPARISON:  Chest radiograph performed 10/18/2015 FINDINGS: Mild left basilar airspace opacity may reflect atelectasis or pneumonia. No pleural effusion or pneumothorax is seen. The heart is borderline normal in size. No acute osseous abnormalities are identified. Chronic right-sided rib deformities are noted. IMPRESSION: Mild left basilar  airspace opacity may reflect atelectasis or pneumonia. Electronically Signed   By: Garald Balding M.D.   On: 04/04/2016 21:57    EKG: Sinus rhythm at 97 bpm with no significant ST or T-wave changes  IMPRESSION AND PLAN:  This is a 60 y.o. male with a history of diabetes, hypertension, malignant tumor of the pancreas, seizures, stroke, MI now being admitted with: 1. Community-acquired pneumonia - patient will be admitted for IV fluid hydration, IV antibiotics, sputum culture and close follow-up. 2. Hypoglycemia-patient was  recently started on a new regimen of insulin which may be contributing to his hypoglycemia in addition to infection. We will discontinue his Lantus and insulin coverage with the exception of regular insulin sliding scale coverage before meals and at bedtime. 3. Chest pain-patient has known history of coronary artery disease and also history of chronic chest and back pain. However we will trend his troponins check a lipid panel and TSH. His initial troponin is negative and EKG is nonischemic. Would consider cardiology consultation if warranted. 4. We'll continue his other home medications for his hypertension, hyperlipidemia, seizure history  Fluids: IV normal saline Diet: Heart healthy, carb controlled DVT Px: Lovenox, SCDs and early ambulation  All the records are reviewed and case discussed with ED provider. Management plans discussed with the patient and/or family who express understanding and agree with plan of care.  CODE STATUS: Full TOTAL TIME TAKING CARE OF THIS PATIENT: 60 minutes.   Berlin Viereck D.O. on 04/05/2016 at 1:57 AM Between 7am to 6pm - Pager - [phone] After 6pm go to [email or web address] - [employer] [facility] [place] [facility] Office [phone] CC: Primary care physician; [name], MD     Note: This dictation was prepared with Dragon dictation along with smaller phrase technology. Any transcriptional errors that result from this process are unintentional.

## 2016-04-05 NOTE — ED Notes (Signed)
Called floor to let them know pt on the way up 

## 2016-04-05 NOTE — Discharge Instructions (Signed)
Diabetic diet  Activity as tolerated  We have reduced you NPH dose to 25 units twice daily

## 2016-04-05 NOTE — Care Management Obs Status (Signed)
[place] NOTIFICATION   Patient Details  Name: Maurice Patterson MRN: [id number] Date of Birth: [birth year]   Medicare Observation Status Notification Given:  Yes    Katrina Stack, RN 04/05/2016, 11:56 AM

## 2016-04-05 NOTE — Progress Notes (Signed)
Pt discharged to home via wc.  Instructions and rx(albuterol, NPH, Regular insulin) given to pt.  Questions answered.  No distress.

## 2016-04-05 NOTE — ED Notes (Signed)
Pt's wife reports pt normally takes seizure medication at 0900 and 2100, and has not yet had 2100 dose. RN released seizure medication (lamictal) from signed & held meds, administered medication, placed seizure pads and informed ED MD Owens Shark and admitting MD. Admitting MD at bedside

## 2016-04-05 NOTE — Care Management (Signed)
Patient presented from home for episode of chest pain accompanied with diaphoresis.  Per his wife -  He has extensive medical history consisting of cardiac, pancreatic mass, seizures and an extensive stay at [facility] (after being transferred there from [facility])  for problems arising from a medication reaction.  Patient appears much older than his stated age of 28.  he does not answer CM questions or participate in conversation- but he does smile and nod his head.  Patient placed in observation for chest pain.  He did receive a dose of 50% dextrose for a blood sugar of 39.  Wife states she was told that patient is admitted with double pneumonia.   She works full time and patient is able to manage at home while she works

## 2016-04-05 NOTE — Progress Notes (Signed)
Pt. Arrived to floor via stretcher. Wife at bedside. Pt. transferred himself from stretcher to bed with stand by assistance. Tele applied, 2nd verifying nurse Baldo Ash, Therapist, sports. Skin is warm and dry with no skin issues noted. Second verifying nurse Baldo Ash, [place].  Pt. A&O, No SOB or acute distress noted. No signs or c/opain observed. Snack given. General room orientation given. Instruction on how to use ascom and call bell system. Will continue to monitor pt.

## 2016-04-06 ENCOUNTER — Encounter: Payer: PPO | Admitting: Speech Pathology

## 2016-04-07 ENCOUNTER — Encounter: Payer: PPO | Admitting: Speech Pathology

## 2016-04-09 LAB — CULTURE, BLOOD (ROUTINE X 2)
Culture: NO GROWTH
Culture: NO GROWTH

## 2016-04-12 NOTE — Discharge Summary (Signed)
[facility] [place] NAME: Maurice Patterson    MR#:  [id number]  DATE OF BIRTH:  July 09, 1956  DATE OF ADMISSION:  04/04/2016 ADMITTING PHYSICIAN: Gladstone Lighter, MD  DATE OF DISCHARGE: 04/05/2016  3:51 PM  PRIMARY CARE PHYSICIAN: ALDRIDGE,BARBARA, MD   ADMISSION DIAGNOSIS:  Community acquired pneumonia [J18.9] Hyperglycemia [R73.9] Chest pain, unspecified chest pain type [R07.9]  DISCHARGE DIAGNOSIS:  Active Problems:   Chest pain, rule out acute myocardial infarction   SECONDARY DIAGNOSIS:   Past Medical History:  Diagnosis Date  . Acute MI ([place])   . Arthritis   . Degenerative lumbar disc   . Diabetes mellitus without complication ([place])   . Hernia of abdominal cavity   . Hypertension   . Malignant intraductal papillary mucinous tumor of pancreas ([place])   . Memory loss   . Seizures ([place])    staring spells  . Stroke ([place])   . TIA (transient ischemic attack)      ADMITTING HISTORY  HISTORY OF PRESENT ILLNESS: Maurice Patterson is a 60 y.o. male with a known history of Diabetes, coronary artery disease, hypertension, malignant tumor of the pancreas, seizures, stroke was in a usual state of health until this afternoon when his wife found him to be held, diaphoretic while driving in the car. She states that she provided him with a doughnut and his symptoms improved. He began to walk around the grocery store felt better but then progressively began to feel increasingly weak. On questioning the patient reports only that he has chest pain which is chronic.  The wife denies any recent complaints such as fevers, chills, dysuria, abdominal pain, cough, nausea, vomiting. He does have chronic diarrhea likely secondary to medications. There has been no change in the quality or caliber of stool.  Per wife patient has had a very long and complicated history beginning in February 2017 when he was admitted here for chest pain, he became  acutely confused, required transfer to [facility] where a long workup ensued.  Ultimately was deemed that his symptoms were a medication side effect. Apparently while he was there he sustained a stroke and has had a long rehabilitation and recovery. He still suffers with confusion, memory loss, some speech difficulty.  Of note he has also been diagnosed with a malignant pancreatic tumor about one year ago but the couple to hospital course earlier in this year has prevented the initiation of treatment. He does have pancreatic stent.  Otherwise there has been no change in status. Patient has been taking medication as prescribed and there has been no recent change in medication or diet.  There has been no recent illness, travel or sick contacts.    Patient denies fevers/chills, weakness, dizziness, chest pain, shortness of breath, N/V/C/D, abdominal pain, dysuria/frequency, changes in mental status.   HOSPITAL COURSE:   This is a 60 y.o. male with a history of diabetes, hypertension, malignant tumor of the pancreas, seizures, stroke, MI admitted with:  *  Hypoglycemia Reduced dose Of NPH insulin. With meal insulin dose remains same. He has follow-up with endocrinology. Blood sugars improved.  * Chronic chest pain This was evaluated as outpatient extensively. He had EKG shows nothing acute. Troponins have been normal.  * Left lung atelectasis/scarring Afebrile. Not needing oxygen. Normal WBC. Not pneumonia.  CONSULTS OBTAINED:    DRUG ALLERGIES:   Allergies  Allergen Reactions  . Hydrocodone Anaphylaxis  . Morphine Other (See Comments)  . Ambien [Zolpidem] Other (  See Comments)    delirium    . Benadryl [Diphenhydramine Hcl (Sleep)]   . Brilinta [Ticagrelor] Other (See Comments)    Stroke   . Flexeril [Cyclobenzaprine] Other (See Comments)    delerium   . Haldol [Haloperidol Lactate] Other (See Comments)    delerium   . Levetiracetam Diarrhea and Other (See Comments)     Unable to walk  . Lorazepam Hives  . Risperdal [Risperidone] Other (See Comments)    Delirium   . Trazodone Other (See Comments)  . Penicillins Rash    Has patient had a PCN reaction causing immediate rash, facial/tongue/throat swelling, SOB or lightheadedness with hypotension: Yes Has patient had a PCN reaction causing severe rash involving mucus membranes or skin necrosis: No Has patient had a PCN reaction that required hospitalization No Has patient had a PCN reaction occurring within the last 10 years: Yes If all of the above answers are "NO", then may proceed with Cephalosporin use.    DISCHARGE MEDICATIONS:   Discharge Medication List as of 04/05/2016  3:14 PM    START taking these medications   Details  albuterol (PROVENTIL HFA;VENTOLIN HFA) 108 (90 Base) MCG/ACT inhaler Inhale 2 puffs into the lungs every 6 (six) hours as needed for wheezing or shortness of breath., Starting Mon 04/05/2016, Print      CONTINUE these medications which have CHANGED   Details  insulin NPH Human (HUMULIN N,NOVOLIN N) 100 UNIT/ML injection Inject 0.25 mLs (25 Units total) into the skin 2 (two) times daily., Starting Mon 04/05/2016, No Print    insulin regular (NOVOLIN R,HUMULIN R) 100 units/mL injection Inject 0.3 mLs (30 Units total) into the skin 3 (three) times daily with meals., Starting Mon 04/05/2016, Normal      CONTINUE these medications which have NOT CHANGED   Details  Acetaminophen 500 MG coapsule Take 500 mg by mouth 2 (two) times daily., Until Discontinued, Historical Med    aspirin 81 MG tablet Take 81 mg by mouth daily., Until Discontinued, Historical Med    atorvastatin (LIPITOR) 80 MG tablet Take 1 tablet by mouth daily., Starting 03/12/2015, Until Discontinued, Historical Med    cholestyramine (QUESTRAN) 4 GM/DOSE powder Take 4 g by mouth 6 (six) times daily., Historical Med    clopidogrel (PLAVIX) 75 MG tablet Take 1 tablet by mouth daily., Until Discontinued, Historical Med     diphenoxylate-atropine (LOMOTIL) 2.5-0.025 MG tablet Take 1 tablet by mouth 4 (four) times daily as needed for diarrhea or loose stools., Starting 05/27/2015, Until Wed 05/26/16, Print    furosemide (LASIX) 40 MG tablet Take 1 tablet by mouth daily., Until Discontinued, Historical Med    guaiFENesin-codeine 100-10 MG/5ML syrup 5-10 ml po qhs prn, Print    lamoTRIgine (LAMICTAL) 100 MG tablet Take 100 mg by mouth 2 (two) times daily., Until Discontinued, Historical Med    losartan (COZAAR) 100 MG tablet Take 1 tablet by mouth daily., Starting 04/28/2014, Until Discontinued, Historical Med    Melatonin 3 MG TABS Take 3 mg by mouth., Historical Med    metoprolol succinate (TOPROL-XL) 25 MG 24 hr tablet Take 1 tablet by mouth daily., Until Discontinued, Historical Med    nitroGLYCERIN (NITROSTAT) 0.4 MG SL tablet Place 1 tablet under the tongue every 5 (five) minutes as needed for chest pain. , Until Discontinued, Historical Med    omeprazole (PRILOSEC) 20 MG capsule Take 1 capsule by mouth daily., Starting 05/20/2015, Until Discontinued, Historical Med    potassium chloride (K-DUR) 10 MEQ tablet Take  10 mEq by mouth 2 (two) times daily., Until Discontinued, Historical Med    insulin aspart (NOVOLOG FLEXPEN) 100 UNIT/ML FlexPen Inject 30 Units into the skin 3 (three) times daily. , Starting Tue 03/18/2015, Historical Med    polyethylene glycol (MIRALAX / GLYCOLAX) packet Take 17 g by mouth daily., Starting 08/23/2015, Until Discontinued, Normal      STOP taking these medications     levofloxacin (LEVAQUIN) 500 MG tablet         Today   VITAL SIGNS:  Blood pressure (!) 159/89, pulse (!) 102, temperature 97.6 F (36.4 C), temperature source Oral, resp. rate (!) 22, height 5\' 7"  (1.702 m), weight 74.9 kg (165 lb 1.6 oz), SpO2 99 %.  I/O:  No intake or output data in the 24 hours ending 04/12/16 1421  PHYSICAL EXAMINATION:  Physical Exam  GENERAL:  60 y.o.-year-old patient lying in  the bed with no acute distress.  LUNGS: Normal breath sounds bilaterally, no wheezing, rales,rhonchi or crepitation. No use of accessory muscles of respiration.  CARDIOVASCULAR: S1, S2 normal. No murmurs, rubs, or gallops.  ABDOMEN: Soft, non-tender, non-distended. Bowel sounds present. No organomegaly or mass.  NEUROLOGIC: Moves all 4 extremities. PSYCHIATRIC: The patient is alert and oriented x 3.  SKIN: No obvious rash, lesion, or ulcer.   DATA REVIEW:   CBC No results for input(s): WBC, HGB, HCT, PLT in the last 168 hours.  Chemistries  No results for input(s): NA, K, CL, CO2, GLUCOSE, BUN, CREATININE, CALCIUM, MG, AST, ALT, ALKPHOS, BILITOT in the last 168 hours.  Invalid input(s): GFRCGP  Cardiac Enzymes No results for input(s): TROPONINI in the last 168 hours.  Microbiology Results  Results for orders placed or performed during the hospital encounter of 04/04/16  Blood culture (routine x 2)     Status: None   Collection Time: 04/04/16 10:50 PM  Result Value Ref Range Status   Specimen Description BLOOD RIGHT ASSIST CONTROL  Final   Special Requests   Final    BOTTLES DRAWN AEROBIC AND ANAEROBIC 16CCAERO,14CCANA   Culture NO GROWTH 5 DAYS  Final   Report Status 04/09/2016 FINAL  Final  Blood culture (routine x 2)     Status: None   Collection Time: 04/04/16 10:55 PM  Result Value Ref Range Status   Specimen Description BLOOD RIGHT ARM  Final   Special Requests   Final    BOTTLES DRAWN AEROBIC AND ANAEROBIC [place]   Culture NO GROWTH 5 DAYS  Final   Report Status 04/09/2016 FINAL  Final    RADIOLOGY:  No results found.  Follow up with PCP in 1 week.  Management plans discussed with the patient, family and they are in agreement.  CODE STATUS:  Code Status History    Date Active Date Inactive Code Status Order ID Comments User Context   04/05/2016  3:04 AM 04/05/2016  6:51 PM Full Code [id number]  Harvie Bridge, DO Inpatient   10/19/2015  1:03 AM  10/20/2015 11:02 AM Full Code [id number]  Harrie Foreman, MD Inpatient   08/21/2015  7:12 PM 08/23/2015  4:57 PM Full Code [id number]  Gladstone Lighter, MD Inpatient   02/19/2015  4:27 PM 02/20/2015  8:54 PM Full Code [id number]  Henreitta Leber, MD Inpatient      TOTAL TIME TAKING CARE OF THIS PATIENT ON DAY OF DISCHARGE: more than 30 minutes.   Hillary Bow R M.D on 04/12/2016 at 2:21 PM  Between 7am to 6pm - Pager - [phone]  After 6pm go to [email or web address] - password [email or web address] [facility]  Office  [phone]  CC: Primary care physician; [name], MD  Note: This dictation was prepared with Dragon dictation along with smaller phrase technology. Any transcriptional errors that result from this process are unintentional.

## 2016-04-14 ENCOUNTER — Ambulatory Visit: Payer: PPO | Admitting: Speech Pathology

## 2016-04-14 DIAGNOSIS — R41841 Cognitive communication deficit: Secondary | ICD-10-CM

## 2016-04-14 DIAGNOSIS — R4701 Aphasia: Secondary | ICD-10-CM | POA: Diagnosis not present

## 2016-04-14 DIAGNOSIS — R471 Dysarthria and anarthria: Secondary | ICD-10-CM

## 2016-04-15 ENCOUNTER — Encounter: Payer: Self-pay | Admitting: Speech Pathology

## 2016-04-15 NOTE — Therapy (Signed)
[place] MAIN [facility] SERVICES [address] [place], [place], [address] Phone: [phone]   Fax:  [phone]  Speech Language Pathology Treatment  Patient Details  Name: Maurice Patterson MRN: [id number] Date of Birth: 1956/03/27 Referring Provider: Dr. Melrose Nakayama  Encounter Date: 04/14/2016      End of Session - 04/15/16 1154    Visit Number 3   Number of Visits 25   Date for SLP Re-Evaluation 07/02/16   SLP Start Time 1600   SLP Stop Time  1700   SLP Time Calculation (min) 60 min   Activity Tolerance Patient tolerated treatment well      Past Medical History:  Diagnosis Date  . Acute MI ([place])   . Arthritis   . Degenerative lumbar disc   . Diabetes mellitus without complication ([place])   . Hernia of abdominal cavity   . Hypertension   . Malignant intraductal papillary mucinous tumor of pancreas ([place])   . Memory loss   . Seizures ([place])    staring spells  . Stroke ([place])   . TIA (transient ischemic attack)     Past Surgical History:  Procedure Laterality Date  . ERCP N/A 09/12/2015   Procedure: ENDOSCOPIC RETROGRADE CHOLANGIOPANCREATOGRAPHY (ERCP);  Surgeon: Hulen Luster, MD;  Location: [facility] ENDOSCOPY;  Service: Gastroenterology;  Laterality: N/A;  . ERCP N/A 01/02/2016   Procedure: ENDOSCOPIC RETROGRADE CHOLANGIOPANCREATOGRAPHY (ERCP);  Surgeon: Hulen Luster, MD;  Location: [facility] ENDOSCOPY;  Service: Gastroenterology;  Laterality: N/A;  . left arm metal plate    . Left shoulder surgery    . Stent times 2  2013   Cardiac    There were no vitals filed for this visit.      Subjective Assessment - 04/15/16 1153    Subjective The patient has been in the hospital; states he feels well today.   Currently in Pain? No/denies               ADULT SLP TREATMENT - 04/15/16 0001      General Information   Behavior/Cognition Alert;Cooperative;Pleasant mood;Confused     Treatment Provided   Treatment provided Cognitive-Linquistic      Pain Assessment   Pain Assessment No/denies pain     Cognitive-Linquistic Treatment   Treatment focused on Aphasia;Cognition;Dysarthria   Skilled Treatment VERBAL EXPRESSION: Generate cogent/grammatical sentence given picture stimulus with 50% accuracy.  Errors are unintelligible utterances (imprecise articulation and rapid rate), unrecognized semantic paraphasia, grammatical errors, and word finding errors.  Name action given verbal definition with 50% accuracy; improves to 60% given prompt to try again and 80% given SLP cues.  AUDITORY COMPREHENSION:  Follow 3-unit body commands with 55% accuracy; improves to 85% given repetition of command.  READING: Read aloud written commands with 85% accuracy. Requires max cues to follow written 1-step commands with objects.     Assessment / Recommendations / Plan   Plan Continue with current plan of care     Progression Toward Goals   Progression toward goals Progressing toward goals          SLP Education - 04/15/16 1154    Education provided Yes   Education Details Speak slower and louder   Person(s) Educated Patient   Methods Explanation   Comprehension Verbalized understanding            SLP Long Term Goals - 03/30/16 1613      SLP LONG TERM GOAL #1   Title Patient will complete mid and high level  word finding tasks with 80% accuracy.   Time 12   Period Weeks   Status New     SLP LONG TERM GOAL #2   Title Patient will generate grammatical and cogent sentence to complete simple/concrete linguistic task with 80% accuracy.   Time 12   Period Weeks   Status New     SLP LONG TERM GOAL #3   Title Patient will complete 2 unit processing tasks with 80% accuracy without the need of repetition of task instructions or significant delays in responding.   Time 12   Period Weeks   Status New     SLP LONG TERM GOAL #4   Title Patient will demonstrate reading comprehension for sentences and paragraphs with 80% accuracy.    Time 12    Period Weeks   Status New     SLP LONG TERM GOAL #5   Title Patient will identify communication and cognitive barriers and participate in developing functional compensatory strategies.   Time 12   Period Weeks   Status New          Plan - 04/15/16 1155    Clinical Impression Statement The patient is less confused today.  Patient is requiring fewer cues to slow speech sufficiently to be understood.  Visual perceptual skills appear to be improved.   Speech Therapy Frequency 2x / week   Duration Other (comment)   Treatment/Interventions Multimodal communcation approach;Functional tasks;Internal/external aids;Cognitive reorganization;SLP instruction and feedback;Patient/family education;Other (comment)   Potential to Achieve Goals Good   Potential Considerations Ability to learn/carryover information;Co-morbidities;Cooperation/participation level;Medical prognosis;Previous level of function;Severity of impairments;Family/community support   SLP Home Exercise Plan reading comprehension worksheet   Consulted and Agree with Plan of Care Patient      Patient will benefit from skilled therapeutic intervention in order to improve the following deficits and impairments:   Aphasia  Cognitive communication deficit  Dysarthria and anarthria    Problem List Patient Active Problem List   Diagnosis Date Noted  . Chest pain, rule out acute myocardial infarction 04/05/2016  . Confusion 10/18/2015  . Constipation 08/23/2015  . Uncontrolled type 2 diabetes mellitus with hyperglycemia, with long-term current use of insulin ([place]) 08/23/2015  . Essential hypertension 08/23/2015  . Right lower lobe pneumonia 08/23/2015  . Abdominal pain 08/21/2015  . Altered mental status 02/19/2015   Leroy Sea, MS/CCC- SLP  Lou Miner 04/15/2016, 11:56 AM  [place] MAIN [facility] SERVICES [address] [place], [place], [address] Phone: [phone]   Fax:   [phone]   Name: Maurice Patterson MRN: [id number] Date of Birth: Dec 24, 1955

## 2016-04-19 ENCOUNTER — Ambulatory Visit: Payer: PPO | Admitting: Speech Pathology

## 2016-04-19 DIAGNOSIS — R4701 Aphasia: Secondary | ICD-10-CM

## 2016-04-19 DIAGNOSIS — R41841 Cognitive communication deficit: Secondary | ICD-10-CM

## 2016-04-19 DIAGNOSIS — R471 Dysarthria and anarthria: Secondary | ICD-10-CM

## 2016-04-20 ENCOUNTER — Encounter: Payer: Self-pay | Admitting: Speech Pathology

## 2016-04-20 NOTE — Therapy (Signed)
[place] MAIN [facility] SERVICES [address] [place], [place], [address] Phone: [phone]   Fax:  [phone]  Speech Language Pathology Treatment  Patient Details  Name: Maurice Patterson MRN: [id number] Date of Birth: 11-21-1955 Referring Provider: Dr. Melrose Nakayama  Encounter Date: 04/19/2016      End of Session - 04/20/16 0833    Visit Number 4   Number of Visits 25   Date for SLP Re-Evaluation 07/02/16   SLP Start Time 1600   SLP Stop Time  1700   SLP Time Calculation (min) 60 min      Past Medical History:  Diagnosis Date  . Acute MI ([place])   . Arthritis   . Degenerative lumbar disc   . Diabetes mellitus without complication ([place])   . Hernia of abdominal cavity   . Hypertension   . Malignant intraductal papillary mucinous tumor of pancreas ([place])   . Memory loss   . Seizures ([place])    staring spells  . Stroke ([place])   . TIA (transient ischemic attack)     Past Surgical History:  Procedure Laterality Date  . ERCP N/A 09/12/2015   Procedure: ENDOSCOPIC RETROGRADE CHOLANGIOPANCREATOGRAPHY (ERCP);  Surgeon: Hulen Luster, MD;  Location: [facility] ENDOSCOPY;  Service: Gastroenterology;  Laterality: N/A;  . ERCP N/A 01/02/2016   Procedure: ENDOSCOPIC RETROGRADE CHOLANGIOPANCREATOGRAPHY (ERCP);  Surgeon: Hulen Luster, MD;  Location: [facility] ENDOSCOPY;  Service: Gastroenterology;  Laterality: N/A;  . left arm metal plate    . Left shoulder surgery    . Stent times 2  2013   Cardiac    There were no vitals filed for this visit.      Subjective Assessment - 04/20/16 0832    Subjective Patient agrees he feels more clear.   Currently in Pain? No/denies               ADULT SLP TREATMENT - 04/20/16 0001      General Information   Behavior/Cognition Alert;Cooperative;Pleasant mood;Confused     Treatment Provided   Treatment provided Cognitive-Linquistic     Pain Assessment   Pain Assessment No/denies pain     Cognitive-Linquistic  Treatment   Treatment focused on Aphasia;Cognition;Dysarthria   Skilled Treatment VERBAL EXPRESSION: Generate cogent/grammatical sentence given picture stimulus with 80% accuracy.  Errors are unintelligible utterances (imprecise articulation and rapid rate), unrecognized semantic paraphasia, grammatical errors, and word finding errors.  Name action given verbal definition with 50% accuracy; improves to 60% given prompt to try again and 80% given SLP cues.  READING: Read aloud written commands with 95% accuracy. Follow written 1-step commands with objects with 30% accuracy independently and 55% accuracy given SLP cues.     Assessment / Recommendations / Plan   Plan Continue with current plan of care     Progression Toward Goals   Progression toward goals Progressing toward goals          SLP Education - 04/20/16 0832    Education provided Yes   Education Details Speak slower and louder   Person(s) Educated Patient   Methods Explanation   Comprehension Verbalized understanding            SLP Long Term Goals - 03/30/16 1613      SLP LONG TERM GOAL #1   Title Patient will complete mid and high level word finding tasks with 80% accuracy.   Time 12   Period Weeks   Status New     SLP LONG TERM GOAL #  2   Title Patient will generate grammatical and cogent sentence to complete simple/concrete linguistic task with 80% accuracy.   Time 12   Period Weeks   Status New     SLP LONG TERM GOAL #3   Title Patient will complete 2 unit processing tasks with 80% accuracy without the need of repetition of task instructions or significant delays in responding.   Time 12   Period Weeks   Status New     SLP LONG TERM GOAL #4   Title Patient will demonstrate reading comprehension for sentences and paragraphs with 80% accuracy.    Time 12   Period Weeks   Status New     SLP LONG TERM GOAL #5   Title Patient will identify communication and cognitive barriers and participate in developing  functional compensatory strategies.   Time 12   Period Weeks   Status New          Plan - 04/20/16 [id number]    Clinical Impression Statement The patient is less confused today.  Patient is requiring fewer cues to slow speech sufficiently to be understood.  Visual perceptual skills and reading comprehension appear to be improved.   Speech Therapy Frequency 2x / week   Duration Other (comment)   Treatment/Interventions Multimodal communcation approach;Functional tasks;Internal/external aids;Cognitive reorganization;SLP instruction and feedback;Patient/family education;Other (comment)   Potential to Achieve Goals Good   Potential Considerations Ability to learn/carryover information;Co-morbidities;Cooperation/participation level;Medical prognosis;Previous level of function;Severity of impairments;Family/community support   SLP Home Exercise Plan reading comprehension worksheet   Consulted and Agree with Plan of Care Patient      Patient will benefit from skilled therapeutic intervention in order to improve the following deficits and impairments:   Aphasia  Cognitive communication deficit  Dysarthria and anarthria    Problem List Patient Active Problem List   Diagnosis Date Noted  . Chest pain, rule out acute myocardial infarction 04/05/2016  . Confusion 10/18/2015  . Constipation 08/23/2015  . Uncontrolled type 2 diabetes mellitus with hyperglycemia, with long-term current use of insulin ([place]) 08/23/2015  . Essential hypertension 08/23/2015  . Right lower lobe pneumonia 08/23/2015  . Abdominal pain 08/21/2015  . Altered mental status 02/19/2015   Leroy Sea, MS/CCC- SLP  Lou Miner 04/20/2016, 8:34 AM  [place] MAIN [facility] SERVICES [address] [place], [place], [address] Phone: [phone]   Fax:  [phone]   Name: Maurice Patterson MRN: [id number] Date of Birth: 1955-09-10

## 2016-04-21 ENCOUNTER — Ambulatory Visit: Payer: PPO | Admitting: Speech Pathology

## 2016-04-21 ENCOUNTER — Encounter: Payer: Self-pay | Admitting: Speech Pathology

## 2016-04-21 DIAGNOSIS — R4701 Aphasia: Secondary | ICD-10-CM

## 2016-04-21 NOTE — Therapy (Signed)
[place] MAIN [facility] SERVICES [address] [place], [place], [address] Phone: [phone]   Fax:  [phone]  Speech Language Pathology Treatment  Patient Details  Name: Maurice Patterson MRN: [id number] Date of Birth: 1956/03/17 Referring Provider: Dr. Melrose Nakayama  Encounter Date: 04/21/2016      End of Session - 04/21/16 1753    Visit Number 5   Number of Visits 25   Date for SLP Re-Evaluation 07/02/16   SLP Start Time 55   SLP Stop Time  1700   SLP Time Calculation (min) 60 min   Activity Tolerance Patient tolerated treatment well      Past Medical History:  Diagnosis Date  . Acute MI ([place])   . Arthritis   . Degenerative lumbar disc   . Diabetes mellitus without complication ([place])   . Hernia of abdominal cavity   . Hypertension   . Malignant intraductal papillary mucinous tumor of pancreas ([place])   . Memory loss   . Seizures ([place])    staring spells  . Stroke ([place])   . TIA (transient ischemic attack)     Past Surgical History:  Procedure Laterality Date  . ERCP N/A 09/12/2015   Procedure: ENDOSCOPIC RETROGRADE CHOLANGIOPANCREATOGRAPHY (ERCP);  Surgeon: Hulen Luster, MD;  Location: [facility] ENDOSCOPY;  Service: Gastroenterology;  Laterality: N/A;  . ERCP N/A 01/02/2016   Procedure: ENDOSCOPIC RETROGRADE CHOLANGIOPANCREATOGRAPHY (ERCP);  Surgeon: Hulen Luster, MD;  Location: [facility] ENDOSCOPY;  Service: Gastroenterology;  Laterality: N/A;  . left arm metal plate    . Left shoulder surgery    . Stent times 2  2013   Cardiac    There were no vitals filed for this visit.      Subjective Assessment - 04/21/16 1749    Subjective The patient agreed he was feeling clearer today.    Currently in Pain? No/denies               ADULT SLP TREATMENT - 04/21/16 0001      General Information   Behavior/Cognition Alert;Cooperative;Pleasant mood;Confused     Treatment Provided   Treatment provided Cognitive-Linquistic     Pain  Assessment   Pain Assessment No/denies pain     Cognitive-Linquistic Treatment   Treatment focused on Aphasia;Cognition;Dysarthria   Skilled Treatment The patient was able to identify pictured objects (drawings) in 85% of attempts. He drew appropriate inferences from an illustrated scene in 37.5% of attempts when provided leading questions. He provided a clearly on-topic response to clinician question in approximately 65% of attempts. Although the patient correctly identified named objects, he was unable to use named objects to follow one-step, novel auditory instructions with or without a visual support (0% success).      Assessment / Recommendations / Plan   Plan Continue with current plan of care     Progression Toward Goals   Progression toward goals Progressing toward goals          SLP Education - 04/21/16 1747    Education provided Yes   Education Details Discussed and practiced using communication supports (pictures/writing/gestures) to facilitate communicative exchanges with family members.    Person(s) Educated Patient   Methods Explanation   Comprehension Need further instruction            SLP Long Term Goals - 03/30/16 1613      SLP LONG TERM GOAL #1   Title Patient will complete mid and high level word finding tasks with 80% accuracy.  Time 12   Period Weeks   Status New     SLP LONG TERM GOAL #2   Title Patient will generate grammatical and cogent sentence to complete simple/concrete linguistic task with 80% accuracy.   Time 12   Period Weeks   Status New     SLP LONG TERM GOAL #3   Title Patient will complete 2 unit processing tasks with 80% accuracy without the need of repetition of task instructions or significant delays in responding.   Time 12   Period Weeks   Status New     SLP LONG TERM GOAL #4   Title Patient will demonstrate reading comprehension for sentences and paragraphs with 80% accuracy.    Time 12   Period Weeks   Status New      SLP LONG TERM GOAL #5   Title Patient will identify communication and cognitive barriers and participate in developing functional compensatory strategies.   Time 12   Period Weeks   Status New          Plan - 04/21/16 1750    Clinical Impression Statement The patient did not appear confused today. He required fewer than 5 cues throughout the session to slow speech to improve intelligibility. He was able to identify most objects represented pictorially and provide an appropriate, brief description of pictured events on most attempts.    Speech Therapy Frequency 2x / week   Duration Other (comment)   Treatment/Interventions Language facilitation;Cueing hierarchy;SLP instruction and feedback;Compensatory techniques;Compensatory strategies;Internal/external aids;Multimodal communcation approach;Patient/family education   Potential to Achieve Goals Good   Potential Considerations Severity of impairments;Cooperation/participation level;Previous level of function;Co-morbidities;Ability to learn/carryover information;Family/community support      Patient will benefit from skilled therapeutic intervention in order to improve the following deficits and impairments:   Aphasia    Problem List Patient Active Problem List   Diagnosis Date Noted  . Chest pain, rule out acute myocardial infarction 04/05/2016  . Confusion 10/18/2015  . Constipation 08/23/2015  . Uncontrolled type 2 diabetes mellitus with hyperglycemia, with long-term current use of insulin ([place]) 08/23/2015  . Essential hypertension 08/23/2015  . Right lower lobe pneumonia 08/23/2015  . Abdominal pain 08/21/2015  . Altered mental status 02/19/2015    Rickard Rhymes  Graduate Student Clinician 04/21/2016, 5:54 PM  [place] MAIN [facility] SERVICES [address] [place], [place], [address] Phone: [phone]   Fax:  [phone]   Name: Maurice Patterson MRN: [id number] Date of Birth:  August 26, 1955

## 2016-04-27 ENCOUNTER — Ambulatory Visit: Payer: PPO | Admitting: Speech Pathology

## 2016-04-29 ENCOUNTER — Ambulatory Visit: Payer: PPO | Attending: Neurology | Admitting: Speech Pathology

## 2016-04-29 DIAGNOSIS — R4701 Aphasia: Secondary | ICD-10-CM

## 2016-04-29 DIAGNOSIS — R41841 Cognitive communication deficit: Secondary | ICD-10-CM

## 2016-04-29 DIAGNOSIS — R471 Dysarthria and anarthria: Secondary | ICD-10-CM

## 2016-04-30 ENCOUNTER — Encounter: Payer: Self-pay | Admitting: Speech Pathology

## 2016-04-30 NOTE — Therapy (Signed)
[place] MAIN [facility] SERVICES [address] [place], [place], [address] Phone: [phone]   Fax:  [phone]  Speech Language Pathology Treatment  Patient Details  Name: Maurice Patterson MRN: [id number] Date of Birth: 01/11/1956 Referring Provider: Dr. Melrose Nakayama  Encounter Date: 04/29/2016      End of Session - 04/30/16 1319    Visit Number 6   Number of Visits 25   Date for SLP Re-Evaluation 07/02/16   SLP Start Time 1600   SLP Stop Time  1700   SLP Time Calculation (min) 60 min   Activity Tolerance Patient tolerated treatment well      Past Medical History:  Diagnosis Date  . Acute MI ([place])   . Arthritis   . Degenerative lumbar disc   . Diabetes mellitus without complication ([place])   . Hernia of abdominal cavity   . Hypertension   . Malignant intraductal papillary mucinous tumor of pancreas ([place])   . Memory loss   . Seizures ([place])    staring spells  . Stroke ([place])   . TIA (transient ischemic attack)     Past Surgical History:  Procedure Laterality Date  . ERCP N/A 09/12/2015   Procedure: ENDOSCOPIC RETROGRADE CHOLANGIOPANCREATOGRAPHY (ERCP);  Surgeon: Hulen Luster, MD;  Location: [facility] ENDOSCOPY;  Service: Gastroenterology;  Laterality: N/A;  . ERCP N/A 01/02/2016   Procedure: ENDOSCOPIC RETROGRADE CHOLANGIOPANCREATOGRAPHY (ERCP);  Surgeon: Hulen Luster, MD;  Location: [facility] ENDOSCOPY;  Service: Gastroenterology;  Laterality: N/A;  . left arm metal plate    . Left shoulder surgery    . Stent times 2  2013   Cardiac    There were no vitals filed for this visit.      Subjective Assessment - 04/30/16 1318    Subjective The patient agreed he was feeling clearer today.    Currently in Pain? No/denies               ADULT SLP TREATMENT - 04/30/16 0001      General Information   Behavior/Cognition Alert;Cooperative;Pleasant mood;Confused     Treatment Provided   Treatment provided Cognitive-Linquistic     Pain  Assessment   Pain Assessment No/denies pain     Cognitive-Linquistic Treatment   Treatment focused on Aphasia;Cognition;Dysarthria   Skilled Treatment VERBAL EXPRESSION: Generate cogent/grammatical sentence given picture stimulus with 80% accuracy.  Errors are unintelligible utterances (imprecise articulation and rapid rate), unrecognized semantic paraphasia, grammatical errors, and word finding errors.  Answer What-questions with 80% accuracy.  READING: Read aloud written commands with 95% accuracy. Follow written 1-step commands with objects with 40% accuracy independently and 80% accuracy given SLP cues.  AUDITORY COMPREHENSION:  Answer 3-unit yes/no questions with 100% accuracy.  Point to 2 objects serially with 80% accuracy.     Assessment / Recommendations / Plan   Plan Continue with current plan of care     Progression Toward Goals   Progression toward goals Progressing toward goals          SLP Education - 04/30/16 1318    Education provided Yes   Education Details slow rate of speech   Person(s) Educated Patient   Methods Explanation   Comprehension Verbalized understanding            SLP Long Term Goals - 03/30/16 1613      SLP LONG TERM GOAL #1   Title Patient will complete mid and high level word finding tasks with 80% accuracy.   Time 12  Period Weeks   Status New     SLP LONG TERM GOAL #2   Title Patient will generate grammatical and cogent sentence to complete simple/concrete linguistic task with 80% accuracy.   Time 12   Period Weeks   Status New     SLP LONG TERM GOAL #3   Title Patient will complete 2 unit processing tasks with 80% accuracy without the need of repetition of task instructions or significant delays in responding.   Time 12   Period Weeks   Status New     SLP LONG TERM GOAL #4   Title Patient will demonstrate reading comprehension for sentences and paragraphs with 80% accuracy.    Time 12   Period Weeks   Status New     SLP LONG  TERM GOAL #5   Title Patient will identify communication and cognitive barriers and participate in developing functional compensatory strategies.   Time 12   Period Weeks   Status New          Plan - 04/30/16 1319    Clinical Impression Statement The patient is less confused today and was able to complete more cognitive-linguistic tasks in the Tx session.  Patient is requiring fewer cues to slow speech sufficiently to be understood.  Visual perceptual skills, reading comprehension, and auditory memory/comprehension are improved.   Speech Therapy Frequency 2x / week   Duration Other (comment)   Treatment/Interventions Language facilitation;Cueing hierarchy;SLP instruction and feedback;Compensatory techniques;Compensatory strategies;Internal/external aids;Multimodal communcation approach;Patient/family education   Potential to Achieve Goals Good   Potential Considerations Severity of impairments;Cooperation/participation level;Previous level of function;Co-morbidities;Ability to learn/carryover information;Family/community support   Consulted and Agree with Plan of Care Patient      Patient will benefit from skilled therapeutic intervention in order to improve the following deficits and impairments:   Aphasia  Cognitive communication deficit  Dysarthria and anarthria    Problem List Patient Active Problem List   Diagnosis Date Noted  . Chest pain, rule out acute myocardial infarction 04/05/2016  . Confusion 10/18/2015  . Constipation 08/23/2015  . Uncontrolled type 2 diabetes mellitus with hyperglycemia, with long-term current use of insulin ([place]) 08/23/2015  . Essential hypertension 08/23/2015  . Right lower lobe pneumonia 08/23/2015  . Abdominal pain 08/21/2015  . Altered mental status 02/19/2015   Leroy Sea, MS/CCC- SLP  Lou Miner 04/30/2016, 1:20 PM  [place] MAIN [facility] SERVICES [address] [place], [place],  [address] Phone: [phone]   Fax:  [phone]   Name: Maurice Patterson MRN: [id number] Date of Birth: 06/15/1956

## 2016-05-06 ENCOUNTER — Ambulatory Visit: Payer: PPO | Admitting: Speech Pathology

## 2016-05-06 DIAGNOSIS — R41841 Cognitive communication deficit: Secondary | ICD-10-CM

## 2016-05-06 DIAGNOSIS — R4701 Aphasia: Secondary | ICD-10-CM

## 2016-05-07 ENCOUNTER — Encounter: Payer: Self-pay | Admitting: Speech Pathology

## 2016-05-07 NOTE — Therapy (Signed)
[place] MAIN [facility] SERVICES [address] [place], [place], [address] Phone: [phone]   Fax:  [phone]  Speech Language Pathology Treatment  Patient Details  Name: Maurice Patterson MRN: [id number] Date of Birth: [birth year] Referring Provider: Dr. Melrose Nakayama  Encounter Date: 05/06/2016      End of Session - 05/07/16 1408    Visit Number 7   Number of Visits 25   Date for SLP Re-Evaluation 07/02/16   SLP Start Time 44   SLP Stop Time  1700   SLP Time Calculation (min) 60 min      Past Medical History:  Diagnosis Date  . Acute MI ([place])   . Arthritis   . Degenerative lumbar disc   . Diabetes mellitus without complication ([place])   . Hernia of abdominal cavity   . Hypertension   . Malignant intraductal papillary mucinous tumor of pancreas ([place])   . Memory loss   . Seizures ([place])    staring spells  . Stroke ([place])   . TIA (transient ischemic attack)     Past Surgical History:  Procedure Laterality Date  . ERCP N/A 09/12/2015   Procedure: ENDOSCOPIC RETROGRADE CHOLANGIOPANCREATOGRAPHY (ERCP);  Surgeon: Hulen Luster, MD;  Location: [facility] ENDOSCOPY;  Service: Gastroenterology;  Laterality: N/A;  . ERCP N/A 01/02/2016   Procedure: ENDOSCOPIC RETROGRADE CHOLANGIOPANCREATOGRAPHY (ERCP);  Surgeon: Hulen Luster, MD;  Location: [facility] ENDOSCOPY;  Service: Gastroenterology;  Laterality: N/A;  . left arm metal plate    . Left shoulder surgery    . Stent times 2  2013   Cardiac    There were no vitals filed for this visit.      Subjective Assessment - 05/07/16 1407    Subjective The patient came without glasses today; picked up his wife's glasses.   Currently in Pain? No/denies               ADULT SLP TREATMENT - 05/07/16 0001      General Information   Behavior/Cognition Alert;Cooperative;Pleasant mood;Confused     Treatment Provided   Treatment provided Cognitive-Linquistic     Pain Assessment   Pain Assessment No/denies  pain     Cognitive-Linquistic Treatment   Treatment focused on Aphasia;Cognition;Dysarthria   Skilled Treatment VERBAL EXPRESSION: Generate cogent/grammatical sentence given picture stimulus with 80% accuracy.  Errors are unintelligible utterances (imprecise articulation and rapid rate), unrecognized semantic paraphasia, grammatical errors, and word finding errors.  Answer What-questions with 60% accuracy.  Patient demonstrates increasing off task remarks, which are confusing in combination with task at hand.       Assessment / Recommendations / Plan   Plan Continue with current plan of care     Progression Toward Goals   Progression toward goals Progressing toward goals          SLP Education - 05/07/16 1407    Education provided Yes   Education Details slow rate of speech   Person(s) Educated Patient   Methods Explanation   Comprehension Verbalized understanding            SLP Long Term Goals - 03/30/16 1613      SLP LONG TERM GOAL #1   Title Patient will complete mid and high level word finding tasks with 80% accuracy.   Time 12   Period Weeks   Status New     SLP LONG TERM GOAL #2   Title Patient will generate grammatical and cogent sentence to complete simple/concrete linguistic task with 80%  accuracy.   Time 12   Period Weeks   Status New     SLP LONG TERM GOAL #3   Title Patient will complete 2 unit processing tasks with 80% accuracy without the need of repetition of task instructions or significant delays in responding.   Time 12   Period Weeks   Status New     SLP LONG TERM GOAL #4   Title Patient will demonstrate reading comprehension for sentences and paragraphs with 80% accuracy.    Time 12   Period Weeks   Status New     SLP LONG TERM GOAL #5   Title Patient will identify communication and cognitive barriers and participate in developing functional compensatory strategies.   Time 12   Period Weeks   Status New          Plan - 05/07/16 1409     Clinical Impression Statement The patient is, overall, less confused and able to complete more cognitive-linguistic tasks in the Tx session.  Patient is requiring fewer cues to slow speech sufficiently to be understood.  Attention deficits interfere with staying on task at times.  Visual perceptual skills, reading comprehension, and auditory memory/comprehension are improved.   Speech Therapy Frequency 2x / week   Duration Other (comment)   Treatment/Interventions Language facilitation;Cueing hierarchy;SLP instruction and feedback;Compensatory techniques;Compensatory strategies;Internal/external aids;Multimodal communcation approach;Patient/family education   Potential to Achieve Goals Good   Potential Considerations Severity of impairments;Cooperation/participation level;Previous level of function;Co-morbidities;Ability to learn/carryover information;Family/community support   SLP Home Exercise Plan reading comprehension worksheet   Consulted and Agree with Plan of Care Patient      Patient will benefit from skilled therapeutic intervention in order to improve the following deficits and impairments:   Aphasia  Cognitive communication deficit    Problem List Patient Active Problem List   Diagnosis Date Noted  . Chest pain, rule out acute myocardial infarction 04/05/2016  . Confusion 10/18/2015  . Constipation 08/23/2015  . Uncontrolled type 2 diabetes mellitus with hyperglycemia, with long-term current use of insulin ([place]) 08/23/2015  . Essential hypertension 08/23/2015  . Right lower lobe pneumonia 08/23/2015  . Abdominal pain 08/21/2015  . Altered mental status 02/19/2015   Leroy Sea, MS/CCC- SLP  Lou Miner 05/07/2016, 2:14 PM  [place] MAIN [facility] SERVICES [address] [place], [place], [address] Phone: [phone]   Fax:  [phone]   Name: Maurice Patterson MRN: [id number] Date of Birth: 08/01/1956

## 2016-05-10 ENCOUNTER — Ambulatory Visit: Payer: PPO | Admitting: Speech Pathology

## 2016-05-10 DIAGNOSIS — R41841 Cognitive communication deficit: Secondary | ICD-10-CM

## 2016-05-10 DIAGNOSIS — R471 Dysarthria and anarthria: Secondary | ICD-10-CM

## 2016-05-10 DIAGNOSIS — R4701 Aphasia: Secondary | ICD-10-CM | POA: Diagnosis not present

## 2016-05-11 ENCOUNTER — Encounter: Payer: Self-pay | Admitting: Speech Pathology

## 2016-05-11 NOTE — Therapy (Signed)
[place] MAIN [facility] SERVICES [address] [place], [place], [address] Phone: [phone]   Fax:  [phone]  Speech Language Pathology Treatment  Patient Details  Name: Maurice Patterson MRN: [id number] Date of Birth: 03/25/1956 Referring Provider: Dr. Melrose Nakayama  Encounter Date: 05/10/2016      End of Session - 05/11/16 1158    Visit Number 8   Number of Visits 25   Date for SLP Re-Evaluation 07/02/16   SLP Start Time 80   SLP Stop Time  1700   SLP Time Calculation (min) 60 min      Past Medical History:  Diagnosis Date  . Acute MI ([place])   . Arthritis   . Degenerative lumbar disc   . Diabetes mellitus without complication ([place])   . Hernia of abdominal cavity   . Hypertension   . Malignant intraductal papillary mucinous tumor of pancreas ([place])   . Memory loss   . Seizures ([place])    staring spells  . Stroke ([place])   . TIA (transient ischemic attack)     Past Surgical History:  Procedure Laterality Date  . ERCP N/A 09/12/2015   Procedure: ENDOSCOPIC RETROGRADE CHOLANGIOPANCREATOGRAPHY (ERCP);  Surgeon: Hulen Luster, MD;  Location: [facility] ENDOSCOPY;  Service: Gastroenterology;  Laterality: N/A;  . ERCP N/A 01/02/2016   Procedure: ENDOSCOPIC RETROGRADE CHOLANGIOPANCREATOGRAPHY (ERCP);  Surgeon: Hulen Luster, MD;  Location: [facility] ENDOSCOPY;  Service: Gastroenterology;  Laterality: N/A;  . left arm metal plate    . Left shoulder surgery    . Stent times 2  2013   Cardiac    There were no vitals filed for this visit.      Subjective Assessment - 05/11/16 1157    Subjective "I don't know why that is so difficult"   Currently in Pain? No/denies               ADULT SLP TREATMENT - 05/11/16 0001      General Information   Behavior/Cognition Alert;Cooperative;Pleasant mood;Confused     Treatment Provided   Treatment provided Cognitive-Linquistic     Pain Assessment   Pain Assessment No/denies pain     Cognitive-Linquistic  Treatment   Treatment focused on Aphasia;Cognition;Dysarthria   Skilled Treatment VERBAL EXPRESSION: Generate cogent/grammatical sentence given picture stimulus with 80% accuracy.  Errors are unintelligible utterances (imprecise articulation and rapid rate), unrecognized semantic paraphasia, grammatical errors, and word finding errors.  Answer Who-questions with 65% accuracy.  Patient demonstrates fewer off task remarks, which last session, were confusing in combination with task at hand.  READING: Read aloud written commands with 95% accuracy. Follow written 1-step commands with 60% accuracy.       Assessment / Recommendations / Plan   Plan Continue with current plan of care     Progression Toward Goals   Progression toward goals Progressing toward goals          SLP Education - 05/11/16 1158    Education provided Yes   Education Details slow rate of speech   Person(s) Educated Patient   Methods Explanation   Comprehension Verbalized understanding            SLP Long Term Goals - 03/30/16 1613      SLP LONG TERM GOAL #1   Title Patient will complete mid and high level word finding tasks with 80% accuracy.   Time 12   Period Weeks   Status New     SLP LONG TERM GOAL #2  Title Patient will generate grammatical and cogent sentence to complete simple/concrete linguistic task with 80% accuracy.   Time 12   Period Weeks   Status New     SLP LONG TERM GOAL #3   Title Patient will complete 2 unit processing tasks with 80% accuracy without the need of repetition of task instructions or significant delays in responding.   Time 12   Period Weeks   Status New     SLP LONG TERM GOAL #4   Title Patient will demonstrate reading comprehension for sentences and paragraphs with 80% accuracy.    Time 12   Period Weeks   Status New     SLP LONG TERM GOAL #5   Title Patient will identify communication and cognitive barriers and participate in developing functional compensatory  strategies.   Time 12   Period Weeks   Status New          Plan - 05/11/16 1159    Clinical Impression Statement The patient is less confused and able to complete more cognitive-linguistic tasks in the Tx session.  He demonstrates increased insight into his cognitive function.  Patient is requiring fewer cues to slow speech sufficiently to be understood.  Attention deficits interfere with staying on task at times.  Visual perceptual skills, reading comprehension, and auditory memory/comprehension are improved.   Speech Therapy Frequency 2x / week   Duration Other (comment)   Treatment/Interventions Language facilitation;Cueing hierarchy;SLP instruction and feedback;Compensatory techniques;Compensatory strategies;Internal/external aids;Multimodal communcation approach;Patient/family education   Potential to Achieve Goals Good   Potential Considerations Severity of impairments;Cooperation/participation level;Previous level of function;Co-morbidities;Ability to learn/carryover information;Family/community support   SLP Home Exercise Plan reading comprehension worksheet   Consulted and Agree with Plan of Care Patient      Patient will benefit from skilled therapeutic intervention in order to improve the following deficits and impairments:   Aphasia  Cognitive communication deficit  Dysarthria and anarthria    Problem List Patient Active Problem List   Diagnosis Date Noted  . Chest pain, rule out acute myocardial infarction 04/05/2016  . Confusion 10/18/2015  . Constipation 08/23/2015  . Uncontrolled type 2 diabetes mellitus with hyperglycemia, with long-term current use of insulin ([place]) 08/23/2015  . Essential hypertension 08/23/2015  . Right lower lobe pneumonia 08/23/2015  . Abdominal pain 08/21/2015  . Altered mental status 02/19/2015    Lou Miner 05/11/2016, 12:00 PM  [place] MAIN [facility] SERVICES [address]  [place], [place], [address] Phone: [phone]   Fax:  [phone]   Name: Maurice Patterson MRN: [id number] Date of Birth: Dec 23, 1955

## 2016-05-12 ENCOUNTER — Ambulatory Visit: Payer: PPO | Admitting: Speech Pathology

## 2016-05-13 ENCOUNTER — Encounter: Payer: Self-pay | Admitting: Speech Pathology

## 2016-05-13 ENCOUNTER — Ambulatory Visit: Payer: PPO | Admitting: Speech Pathology

## 2016-05-13 DIAGNOSIS — R4701 Aphasia: Secondary | ICD-10-CM | POA: Diagnosis not present

## 2016-05-13 DIAGNOSIS — R41841 Cognitive communication deficit: Secondary | ICD-10-CM

## 2016-05-13 DIAGNOSIS — R471 Dysarthria and anarthria: Secondary | ICD-10-CM

## 2016-05-13 NOTE — Therapy (Signed)
[place] MAIN [facility] SERVICES [address] [place], [place], [address] Phone: [phone]   Fax:  [phone]  Speech Language Pathology Treatment  Patient Details  Name: Maurice Patterson MRN: [id number] Date of Birth: 17-Apr-1956 Referring Provider: Dr. Melrose Nakayama  Encounter Date: 05/13/2016      End of Session - 05/13/16 1224    Visit Number 9   Number of Visits 25   Date for SLP Re-Evaluation 07/02/16   SLP Start Time 73   SLP Stop Time  1200   SLP Time Calculation (min) 60 min   Activity Tolerance Patient tolerated treatment well      Past Medical History:  Diagnosis Date  . Acute MI ([place])   . Arthritis   . Degenerative lumbar disc   . Diabetes mellitus without complication ([place])   . Hernia of abdominal cavity   . Hypertension   . Malignant intraductal papillary mucinous tumor of pancreas ([place])   . Memory loss   . Seizures ([place])    staring spells  . Stroke ([place])   . TIA (transient ischemic attack)     Past Surgical History:  Procedure Laterality Date  . ERCP N/A 09/12/2015   Procedure: ENDOSCOPIC RETROGRADE CHOLANGIOPANCREATOGRAPHY (ERCP);  Surgeon: Hulen Luster, MD;  Location: [facility] ENDOSCOPY;  Service: Gastroenterology;  Laterality: N/A;  . ERCP N/A 01/02/2016   Procedure: ENDOSCOPIC RETROGRADE CHOLANGIOPANCREATOGRAPHY (ERCP);  Surgeon: Hulen Luster, MD;  Location: [facility] ENDOSCOPY;  Service: Gastroenterology;  Laterality: N/A;  . left arm metal plate    . Left shoulder surgery    . Stent times 2  2013   Cardiac    There were no vitals filed for this visit.      Subjective Assessment - 05/13/16 1224    Subjective "I don't know why that is so difficult"               ADULT SLP TREATMENT - 05/13/16 0001      General Information   Behavior/Cognition Alert;Cooperative;Pleasant mood;Confused     Treatment Provided   Treatment provided Cognitive-Linquistic     Pain Assessment   Pain Assessment No/denies pain     Cognitive-Linquistic Treatment   Treatment focused on Aphasia;Cognition;Dysarthria   Skilled Treatment VERBAL EXPRESSION: Generate cogent/grammatical sentence given picture stimulus with 90% accuracy.  Errors are unintelligible utterances (imprecise articulation and rapid rate), unrecognized semantic paraphasia, grammatical errors, and word finding errors.  Answer Who-questions with 70% accuracy and Where-questions with 60% acuracy.  Patient demonstrates increasing off task remarks, which are confusing in combination with task at hand.  READING: Read aloud written commands with 95% accuracy. Follow written directions with 800% accuracy independently and 100% accuracy given SLP cues.       Assessment / Recommendations / Plan   Plan Continue with current plan of care     Progression Toward Goals   Progression toward goals Progressing toward goals          SLP Education - 05/13/16 1224    Education provided Yes   Education Details slow rate of speech, consentrate   Person(s) Educated Patient   Methods Explanation   Comprehension Verbalized understanding            SLP Long Term Goals - 03/30/16 1613      SLP LONG TERM GOAL #1   Title Patient will complete mid and high level word finding tasks with 80% accuracy.   Time 12   Period Weeks   Status New  SLP LONG TERM GOAL #2   Title Patient will generate grammatical and cogent sentence to complete simple/concrete linguistic task with 80% accuracy.   Time 12   Period Weeks   Status New     SLP LONG TERM GOAL #3   Title Patient will complete 2 unit processing tasks with 80% accuracy without the need of repetition of task instructions or significant delays in responding.   Time 12   Period Weeks   Status New     SLP LONG TERM GOAL #4   Title Patient will demonstrate reading comprehension for sentences and paragraphs with 80% accuracy.    Time 12   Period Weeks   Status New     SLP LONG TERM GOAL #5   Title Patient will  identify communication and cognitive barriers and participate in developing functional compensatory strategies.   Time 12   Period Weeks   Status New          Plan - 05/13/16 1225    Clinical Impression Statement The patient is less confused and able to complete more cognitive-linguistic tasks in the Tx session.  He demonstrates increased insight into his cognitive function.  Patient is requiring fewer cues to slow speech sufficiently to be understood.  The patient demonstrates improved sustained attention.  Visual perceptual skills, reading comprehension, and auditory memory/comprehension are improved.   Speech Therapy Frequency 2x / week   Duration Other (comment)   Treatment/Interventions Language facilitation;Cueing hierarchy;SLP instruction and feedback;Compensatory techniques;Compensatory strategies;Internal/external aids;Multimodal communcation approach;Patient/family education   Potential to Achieve Goals Good   Potential Considerations Severity of impairments;Cooperation/participation level;Previous level of function;Co-morbidities;Ability to learn/carryover information;Family/community support   SLP Home Exercise Plan reading comprehension worksheet   Consulted and Agree with Plan of Care Patient      Patient will benefit from skilled therapeutic intervention in order to improve the following deficits and impairments:   Aphasia  Cognitive communication deficit  Dysarthria and anarthria    Problem List Patient Active Problem List   Diagnosis Date Noted  . Chest pain, rule out acute myocardial infarction 04/05/2016  . Confusion 10/18/2015  . Constipation 08/23/2015  . Uncontrolled type 2 diabetes mellitus with hyperglycemia, with long-term current use of insulin ([place]) 08/23/2015  . Essential hypertension 08/23/2015  . Right lower lobe pneumonia 08/23/2015  . Abdominal pain 08/21/2015  . Altered mental status 02/19/2015   Leroy Sea, MS/CCC- SLP  Lou Miner 05/13/2016, 12:27 PM  [place] MAIN [facility] SERVICES [address] [place], [place], [address] Phone: [phone]   Fax:  [phone]   Name: Maurice Patterson MRN: [id number] Date of Birth: 05/04/1956

## 2016-05-19 ENCOUNTER — Encounter: Payer: Self-pay | Admitting: Speech Pathology

## 2016-05-19 ENCOUNTER — Ambulatory Visit: Payer: PPO | Admitting: Speech Pathology

## 2016-05-19 DIAGNOSIS — R4701 Aphasia: Secondary | ICD-10-CM

## 2016-05-19 NOTE — Therapy (Signed)
[place] MAIN [facility] SERVICES [address] [place], [place], [address] Phone: [phone]   Fax:  [phone]  Speech Language Pathology Treatment / Progress Report  Patient Details  Name: Maurice Patterson MRN: [id number] Date of Birth: [birth year] Referring Provider: Dr. Melrose Nakayama  Encounter Date: 05/19/2016      End of Session - 05/19/16 1756    Visit Number 10   Number of Visits 25   Date for SLP Re-Evaluation 07/02/16   SLP Start Time 1600   SLP Stop Time  1700   SLP Time Calculation (min) 60 min   Activity Tolerance Patient tolerated treatment well      Past Medical History:  Diagnosis Date  . Acute MI ([place])   . Arthritis   . Degenerative lumbar disc   . Diabetes mellitus without complication ([place])   . Hernia of abdominal cavity   . Hypertension   . Malignant intraductal papillary mucinous tumor of pancreas ([place])   . Memory loss   . Seizures ([place])    staring spells  . Stroke ([place])   . TIA (transient ischemic attack)     Past Surgical History:  Procedure Laterality Date  . ERCP N/A 09/12/2015   Procedure: ENDOSCOPIC RETROGRADE CHOLANGIOPANCREATOGRAPHY (ERCP);  Surgeon: Hulen Luster, MD;  Location: [facility] ENDOSCOPY;  Service: Gastroenterology;  Laterality: N/A;  . ERCP N/A 01/02/2016   Procedure: ENDOSCOPIC RETROGRADE CHOLANGIOPANCREATOGRAPHY (ERCP);  Surgeon: Hulen Luster, MD;  Location: [facility] ENDOSCOPY;  Service: Gastroenterology;  Laterality: N/A;  . left arm metal plate    . Left shoulder surgery    . Stent times 2  2013   Cardiac    There were no vitals filed for this visit.      Subjective Assessment - 05/19/16 1755    Subjective The patient had completed his homework (answer "What" questions). He stated that it was "not too bad" after the first couple of items.    Currently in Pain? No/denies               ADULT SLP TREATMENT - 05/19/16 0001      General Information   Behavior/Cognition  Alert;Cooperative;Pleasant mood     Treatment Provided   Treatment provided Cognitive-Linquistic     Pain Assessment   Pain Assessment No/denies pain     Cognitive-Linquistic Treatment   Treatment focused on Aphasia;Cognition;Dysarthria   Skilled Treatment The patient completed a task matching a written word to the corresponding image from a field of 6 with 100% accuracy and independently. Clinician noted that reading the written word aloud and naming aloud the concepts represented by the images was a strategy the patient employed. The patient completed one-step written instructions independently with 100% accuracy. He then followed two-step written directions accurately, the only errors involving failure to note prepositions of position: "behind," "in front of," etc.)  He completed these two-step directions independently in 75% of attempts, 25% with a cue. The patient needed to eliminate visual distractors to successfully complete this task (cover the preceding item with a sheet of paper). The patient named three members of a category provided a category with two descriptors with 50% success. He named the category provided three members with 2/3 success (semantic cue required in 1/3 of instances).      Assessment / Recommendations / Plan   Plan Continue with current plan of care     Progression Toward Goals   Progression toward goals Progressing toward goals  SLP Education - 05/19/16 1755    Education provided Yes   Education Details drew attention to vague language, demonstrated rephrasing with specific language   Person(s) Educated Patient   Methods Explanation;Demonstration   Comprehension Verbalized understanding            SLP Long Term Goals - 05/19/16 1757      SLP LONG TERM GOAL #1   Title Patient will complete mid and high level word finding tasks with 80% accuracy.   Time 12   Period Weeks   Status Partially Met     SLP LONG TERM GOAL #2   Title Patient  will generate grammatical and cogent sentence to complete simple/concrete linguistic task with 80% accuracy.   Time 12   Period Weeks   Status Partially Met     SLP LONG TERM GOAL #3   Title Patient will complete 2 unit processing tasks with 80% accuracy without the need of repetition of task instructions or significant delays in responding.   Time 12   Period Weeks   Status Partially Met     SLP LONG TERM GOAL #4   Title Patient will demonstrate reading comprehension for sentences and paragraphs with 80% accuracy.    Time 12   Period Weeks   Status Partially Met     SLP LONG TERM GOAL #5   Title Patient will identify communication and cognitive barriers and participate in developing functional compensatory strategies.   Time 12   Period Weeks   Status Partially Met          Plan - 05/19/16 1756    Clinical Impression Statement The patient is less confused and able to complete more cognitive-linguistic tasks in the Tx session.  Patient is requiring fewer cues to slow speech sufficiently to be understood.  The patient demonstrates improved sustained attention and demonstrates emerging skill in initiating strategies to aid his selective attention.  Visual perceptual skills, reading comprehension, and auditory memory/comprehension are improved. In the last few sessions in particular, the patient has demonstrated improvement in reading comprehension, association of written words with objects or concepts, and ability to follow written and spoken two-step directions. He sometimes initiates strategies to aid in attention and auditory memory (e.g., reading or repeating out loud to self). Although the patient appears to consistently provide an on-topic response to conversational partner (improvement), he frequently employs vague or empty language. Future sessions will continue to target use of specific language to communicate ideas more effectively.    Speech Therapy Frequency 2x / week    Duration Other (comment)   Treatment/Interventions Language facilitation;Cueing hierarchy;SLP instruction and feedback;Compensatory techniques;Compensatory strategies;Internal/external aids;Multimodal communcation approach;Patient/family education   Potential to Achieve Goals Good   Potential Considerations Severity of impairments;Cooperation/participation level;Previous level of function;Co-morbidities;Ability to learn/carryover information;Family/community support   SLP Home Exercise Plan Worksheet: naming category provided three members    Consulted and Agree with Plan of Care Patient      Patient will benefit from skilled therapeutic intervention in order to improve the following deficits and impairments:   Aphasia    Problem List Patient Active Problem List   Diagnosis Date Noted  . Chest pain, rule out acute myocardial infarction 04/05/2016  . Confusion 10/18/2015  . Constipation 08/23/2015  . Uncontrolled type 2 diabetes mellitus with hyperglycemia, with long-term current use of insulin ([place]) 08/23/2015  . Essential hypertension 08/23/2015  . Right lower lobe pneumonia 08/23/2015  . Abdominal pain 08/21/2015  . Altered mental status 02/19/2015  Rickard Rhymes  Graduate Student Clinician 05/19/2016, 6:01 PM  [place] MAIN [facility] SERVICES [address] [place], [place], [address] Phone: [phone]   Fax:  [phone]   Name: Maurice Patterson MRN: [id number] Date of Birth: April 09, 1956

## 2016-05-21 ENCOUNTER — Encounter: Payer: PPO | Admitting: Speech Pathology

## 2016-05-25 ENCOUNTER — Ambulatory Visit: Payer: PPO | Attending: Neurology | Admitting: Speech Pathology

## 2016-05-25 DIAGNOSIS — R41841 Cognitive communication deficit: Secondary | ICD-10-CM | POA: Diagnosis not present

## 2016-05-25 DIAGNOSIS — R471 Dysarthria and anarthria: Secondary | ICD-10-CM | POA: Diagnosis not present

## 2016-05-25 DIAGNOSIS — R4701 Aphasia: Secondary | ICD-10-CM | POA: Insufficient documentation

## 2016-05-26 ENCOUNTER — Encounter: Payer: Self-pay | Admitting: Speech Pathology

## 2016-05-26 NOTE — Therapy (Signed)
[place] MAIN [facility] SERVICES [address] [place], [place], [address] Phone: [phone]   Fax:  [phone]  Speech Language Pathology Treatment  Patient Details  Name: Maurice Patterson MRN: [id number] Date of Birth: March 11, 1956 Referring Provider: Dr. Melrose Nakayama  Encounter Date: 05/25/2016      End of Session - 05/26/16 0901    Visit Number 11   Number of Visits 25   Date for SLP Re-Evaluation 07/02/16   SLP Start Time 0900   SLP Stop Time  1000   SLP Time Calculation (min) 60 min   Activity Tolerance Patient tolerated treatment well      Past Medical History:  Diagnosis Date  . Acute MI   . Arthritis   . Degenerative lumbar disc   . Diabetes mellitus without complication ([place])   . Hernia of abdominal cavity   . Hypertension   . Malignant intraductal papillary mucinous tumor of pancreas ([place])   . Memory loss   . Seizures ([place])    staring spells  . Stroke ([place])   . TIA (transient ischemic attack)     Past Surgical History:  Procedure Laterality Date  . ERCP N/A 09/12/2015   Procedure: ENDOSCOPIC RETROGRADE CHOLANGIOPANCREATOGRAPHY (ERCP);  Surgeon: Hulen Luster, MD;  Location: [facility] ENDOSCOPY;  Service: Gastroenterology;  Laterality: N/A;  . ERCP N/A 01/02/2016   Procedure: ENDOSCOPIC RETROGRADE CHOLANGIOPANCREATOGRAPHY (ERCP);  Surgeon: Hulen Luster, MD;  Location: [facility] ENDOSCOPY;  Service: Gastroenterology;  Laterality: N/A;  . left arm metal plate    . Left shoulder surgery    . Stent times 2  2013   Cardiac    There were no vitals filed for this visit.      Subjective Assessment - 05/26/16 0900    Subjective The patient agrees that he is more "clear" with his thinking.   Currently in Pain? No/denies               ADULT SLP TREATMENT - 05/26/16 0001      General Information   Behavior/Cognition Alert;Cooperative;Pleasant mood     Treatment Provided   Treatment provided Cognitive-Linquistic     Pain Assessment    Pain Assessment No/denies pain     Cognitive-Linquistic Treatment   Treatment focused on Aphasia;Cognition;Dysarthria   Skilled Treatment VERBAL EXPRESSION: Generate cogent/grammatical/intelligible sentence given simple picture stimulus with 90% accuracy.  Generate cogent/grammatical/intelligible sentence given moderate complex picture stimulus with 70% accuracy.  Errors are unintelligible utterances (imprecise articulation and rapid rate), unrecognized semantic paraphasia, grammatical errors, and word finding errors.  Answer When-questions with 70% accuracy and Why-questions with 70% acuracy.  Patient demonstrates fewer off task remarks, which had been confusing in combination with task at hand.      Assessment / Recommendations / Plan   Plan Continue with current plan of care     Progression Toward Goals   Progression toward goals Progressing toward goals          SLP Education - 05/26/16 0900    Education provided Yes   Education Details Discussed effect of poor sleep habits on thinking clarity   Person(s) Educated Patient   Methods Explanation   Comprehension Verbalized understanding            SLP Long Term Goals - 05/19/16 1757      SLP LONG TERM GOAL #1   Title Patient will complete mid and high level word finding tasks with 80% accuracy.   Time 12   Period Weeks  Status Partially Met     SLP LONG TERM GOAL #2   Title Patient will generate grammatical and cogent sentence to complete simple/concrete linguistic task with 80% accuracy.   Time 12   Period Weeks   Status Partially Met     SLP LONG TERM GOAL #3   Title Patient will complete 2 unit processing tasks with 80% accuracy without the need of repetition of task instructions or significant delays in responding.   Time 12   Period Weeks   Status Partially Met     SLP LONG TERM GOAL #4   Title Patient will demonstrate reading comprehension for sentences and paragraphs with 80% accuracy.    Time 12    Period Weeks   Status Partially Met     SLP LONG TERM GOAL #5   Title Patient will identify communication and cognitive barriers and participate in developing functional compensatory strategies.   Time 12   Period Weeks   Status Partially Met          Plan - 05/26/16 0901    Clinical Impression Statement The patient is less confused and able to complete more cognitive-linguistic tasks in the Tx session.  He demonstrates increased insight into his cognitive function.  Patient is requiring fewer cues to slow speech sufficiently to be understood.  The patient demonstrates improved sustained attention.  Visual perceptual skills, reading comprehension, and auditory memory/comprehension are improved.   Speech Therapy Frequency 2x / week   Duration Other (comment)   Treatment/Interventions Language facilitation;Cueing hierarchy;SLP instruction and feedback;Compensatory techniques;Compensatory strategies;Internal/external aids;Multimodal communcation approach;Patient/family education   Potential to Achieve Goals Good   Potential Considerations Severity of impairments;Cooperation/participation level;Previous level of function;Co-morbidities;Ability to learn/carryover information;Family/community support   Consulted and Agree with Plan of Care Patient      Patient will benefit from skilled therapeutic intervention in order to improve the following deficits and impairments:   Aphasia  Cognitive communication deficit  Dysarthria and anarthria    Problem List Patient Active Problem List   Diagnosis Date Noted  . Chest pain, rule out acute myocardial infarction 04/05/2016  . Confusion 10/18/2015  . Constipation 08/23/2015  . Uncontrolled type 2 diabetes mellitus with hyperglycemia, with long-term current use of insulin ([place]) 08/23/2015  . Essential hypertension 08/23/2015  . Right lower lobe pneumonia ([place]) 08/23/2015  . Abdominal pain 08/21/2015  . Altered mental status 02/19/2015    Leroy Sea, MS/CCC- SLP  Lou Miner 05/26/2016, 9:02 AM  [place] MAIN [facility] SERVICES [address] [place], [place], [address] Phone: [phone]   Fax:  [phone]   Name: Maurice Patterson MRN: [id number] Date of Birth: 1956/02/11

## 2016-05-27 ENCOUNTER — Encounter: Payer: PPO | Admitting: Speech Pathology

## 2016-05-28 ENCOUNTER — Encounter: Payer: Self-pay | Admitting: Speech Pathology

## 2016-05-28 ENCOUNTER — Ambulatory Visit: Payer: PPO | Admitting: Speech Pathology

## 2016-05-28 DIAGNOSIS — R4701 Aphasia: Secondary | ICD-10-CM | POA: Diagnosis not present

## 2016-05-28 DIAGNOSIS — R41841 Cognitive communication deficit: Secondary | ICD-10-CM

## 2016-05-28 DIAGNOSIS — R471 Dysarthria and anarthria: Secondary | ICD-10-CM

## 2016-05-28 NOTE — Therapy (Signed)
[place] MAIN [facility] SERVICES [address] [place], [place], [address] Phone: [phone]   Fax:  [phone]  Speech Language Pathology Treatment  Patient Details  Name: Maurice Patterson MRN: [id number] Date of Birth: 05-14-1956 Referring Provider: Dr. Melrose Nakayama  Encounter Date: 05/28/2016      End of Session - 05/28/16 1455    Visit Number 12   Number of Visits 25   Date for SLP Re-Evaluation 07/02/16   SLP Start Time 0900   SLP Stop Time  1000   SLP Time Calculation (min) 60 min      Past Medical History:  Diagnosis Date  . Acute MI   . Arthritis   . Degenerative lumbar disc   . Diabetes mellitus without complication ([place])   . Hernia of abdominal cavity   . Hypertension   . Malignant intraductal papillary mucinous tumor of pancreas ([place])   . Memory loss   . Seizures ([place])    staring spells  . Stroke ([place])   . TIA (transient ischemic attack)     Past Surgical History:  Procedure Laterality Date  . ERCP N/A 09/12/2015   Procedure: ENDOSCOPIC RETROGRADE CHOLANGIOPANCREATOGRAPHY (ERCP);  Surgeon: Hulen Luster, MD;  Location: [facility] ENDOSCOPY;  Service: Gastroenterology;  Laterality: N/A;  . ERCP N/A 01/02/2016   Procedure: ENDOSCOPIC RETROGRADE CHOLANGIOPANCREATOGRAPHY (ERCP);  Surgeon: Hulen Luster, MD;  Location: [facility] ENDOSCOPY;  Service: Gastroenterology;  Laterality: N/A;  . left arm metal plate    . Left shoulder surgery    . Stent times 2  2013   Cardiac    There were no vitals filed for this visit.      Subjective Assessment - 05/28/16 1454    Subjective The patient agrees that he is more "clear" with his thinking.   Currently in Pain? No/denies               ADULT SLP TREATMENT - 05/28/16 0001      General Information   Behavior/Cognition Alert;Cooperative;Pleasant mood     Treatment Provided   Treatment provided Cognitive-Linquistic     Pain Assessment   Pain Assessment No/denies pain     Cognitive-Linquistic Treatment   Treatment focused on Aphasia;Cognition;Dysarthria   Skilled Treatment VERBAL EXPRESSION: Generate cogent/grammatical/intelligible sentence given complex picture stimulus with 70% accuracy.  Errors are unintelligible utterances (imprecise articulation and rapid rate), unrecognized semantic paraphasia, grammatical errors, and word finding errors.  State example of an intangible with 70% accuracy.  Patient demonstrates fewer off task remarks, which had been confusing in combination with task at hand. READING: Read aloud and solve simple math word problems with 60% accuracy independently.       Assessment / Recommendations / Plan   Plan Continue with current plan of care     Progression Toward Goals   Progression toward goals Progressing toward goals          SLP Education - 05/28/16 1455    Education provided Yes   Education Details Reviewed improvements, especially in reading comprehension   Person(s) Educated Patient   Methods Explanation   Comprehension Verbalized understanding            SLP Long Term Goals - 05/19/16 1757      SLP LONG TERM GOAL #1   Title Patient will complete mid and high level word finding tasks with 80% accuracy.   Time 12   Period Weeks   Status Partially Met     SLP LONG TERM  GOAL #2   Title Patient will generate grammatical and cogent sentence to complete simple/concrete linguistic task with 80% accuracy.   Time 12   Period Weeks   Status Partially Met     SLP LONG TERM GOAL #3   Title Patient will complete 2 unit processing tasks with 80% accuracy without the need of repetition of task instructions or significant delays in responding.   Time 12   Period Weeks   Status Partially Met     SLP LONG TERM GOAL #4   Title Patient will demonstrate reading comprehension for sentences and paragraphs with 80% accuracy.    Time 12   Period Weeks   Status Partially Met     SLP LONG TERM GOAL #5   Title Patient will  identify communication and cognitive barriers and participate in developing functional compensatory strategies.   Time 12   Period Weeks   Status Partially Met          Plan - 05/28/16 1455    Clinical Impression Statement The patient continues to be less confused and able to complete more cognitive-linguistic tasks in the Tx session.  He demonstrates increased insight into his cognitive function.  Patient is requiring fewer cues to slow speech sufficiently to be understood.  The patient demonstrates improved sustained attention.  Visual perceptual skills, reading comprehension, and auditory memory/comprehension are improved.   Speech Therapy Frequency 2x / week   Duration Other (comment)   Treatment/Interventions Language facilitation;Cueing hierarchy;SLP instruction and feedback;Compensatory techniques;Compensatory strategies;Internal/external aids;Multimodal communcation approach;Patient/family education   Potential to Achieve Goals Good   Potential Considerations Severity of impairments;Cooperation/participation level;Previous level of function;Co-morbidities;Ability to learn/carryover information;Family/community support   SLP Home Exercise Plan Worksheet: reading/concrete reasoning   Consulted and Agree with Plan of Care Patient      Patient will benefit from skilled therapeutic intervention in order to improve the following deficits and impairments:   Aphasia  Cognitive communication deficit  Dysarthria and anarthria    Problem List Patient Active Problem List   Diagnosis Date Noted  . Chest pain, rule out acute myocardial infarction 04/05/2016  . Confusion 10/18/2015  . Constipation 08/23/2015  . Uncontrolled type 2 diabetes mellitus with hyperglycemia, with long-term current use of insulin ([place]) 08/23/2015  . Essential hypertension 08/23/2015  . Right lower lobe pneumonia ([place]) 08/23/2015  . Abdominal pain 08/21/2015  . Altered mental status 02/19/2015   Leroy Sea, MS/CCC- SLP  Lou Miner 05/28/2016, 2:57 PM  [place] MAIN [facility] SERVICES [address] [place], [place], [address] Phone: [phone]   Fax:  [phone]   Name: Maurice Patterson MRN: [id number] Date of Birth: 08-01-1956

## 2016-06-02 ENCOUNTER — Encounter: Payer: PPO | Admitting: Speech Pathology

## 2016-06-04 ENCOUNTER — Encounter: Payer: Self-pay | Admitting: Speech Pathology

## 2016-06-04 ENCOUNTER — Ambulatory Visit: Payer: PPO | Admitting: Speech Pathology

## 2016-06-04 DIAGNOSIS — R4701 Aphasia: Secondary | ICD-10-CM

## 2016-06-04 NOTE — Therapy (Signed)
[place] [facility] MAIN [facility] SERVICES [address] [place], [place], [address] Phone: [phone]   Fax:  [phone]  Speech Language Pathology Treatment  Patient Details  Name: Maurice Patterson MRN: [id number] Date of Birth: 09/27/1955 Referring Provider: Dr. Malvin Johns  Encounter Date: 06/04/2016      End of Session - 06/04/16 1020    Visit Number 13   Number of Visits 25   Date for SLP Re-Evaluation 07/02/16   SLP Start Time 0830   SLP Stop Time  0930   SLP Time Calculation (min) 60 min   Activity Tolerance Patient tolerated treatment well      Past Medical History:  Diagnosis Date  . Acute MI   . Arthritis   . Degenerative lumbar disc   . Diabetes mellitus without complication (H[facility])   . Hernia of abdominal cavity   . Hypertension   . Malignant intraductal papillary mucinous tumor of pancreas (HCC)   . Memory loss   . Seizures ([facility])    staring spells  . Stroke ([facility])   . TIA (transient ischemic attack)     Past Surgical History:  Procedure Laterality Date  . ERCP N/A 09/12/2015   Procedure: ENDOSCOPIC RETROGRADE CHOLANGIOPANCREATOGRAPHY (ERCP);  Surgeon: Wallace Cullens, MD;  Location: [facility] ENDOSCOPY;  Service: Gastroenterology;  Laterality: N/A;  . ERCP N/A 01/02/2016   Procedure: ENDOSCOPIC RETROGRADE CHOLANGIOPANCREATOGRAPHY (ERCP);  Surgeon: Wallace Cullens, MD;  Location: [facility] ENDOSCOPY;  Service: Gastroenterology;  Laterality: N/A;  . left arm metal plate    . Left shoulder surgery    . Stent times 2  2013   Cardiac    There were no vitals filed for this visit.      Subjective Assessment - 06/04/16 1019    Subjective When asked if he has noticed his progress over the last few weeks, the patient responded, "A little bit."   Currently in Pain? No/denies               ADULT SLP TREATMENT - 06/04/16 0001      General Information   Behavior/Cognition Alert;Cooperative;Pleasant mood     Treatment Provided   Treatment  provided Cognitive-Linquistic     Pain Assessment   Pain Assessment No/denies pain     Cognitive-Linquistic Treatment   Treatment focused on Aphasia;Cognition;Dysarthria   Skilled Treatment EXPRESSIVE LANGUAGE: Intervention focused on replacing habitual vague language with specific language for better communication effectiveness. During the session, the patient spontaneously produced 6 utterances characterized by vague language. When presented with a written prompt and two written responses, the patient correctly selected the response with more specific language in 67% of trials. When presented with two auditory choices, the patient correctly identified the utterance with more specific language in 67% of trials. When presented with a visual stimulus, the patient independently generated sentence descriptions using specific language and no vague language in 69% of trials. When provided with a cue (visual, leading question, or lead-in cue), the patient corrected 28% of errors. The patient independently self-corrected one error. At this time, the patient often requires support in the form of repetition, auditory presentation of contrasting specific language, written presentation of his utterances, and cues to identify vague speech.       Assessment / Recommendations / Plan   Plan Continue with current plan of care     Progression Toward Goals   Progression toward goals Progressing toward goals          SLP Education -  06/04/16 1019    Education provided Yes   Education Details Defined and provided examples of vague speech and ways to improve communication with specific language   Person(s) Educated Patient   Methods Explanation;Demonstration   Comprehension Verbalized understanding;Returned demonstration            SLP Long Term Goals - 05/19/16 1757      SLP LONG TERM GOAL #1   Title Patient will complete mid and high level word finding tasks with 80% accuracy.   Time 12   Period  Weeks   Status Partially Met     SLP LONG TERM GOAL #2   Title Patient will generate grammatical and cogent sentence to complete simple/concrete linguistic task with 80% accuracy.   Time 12   Period Weeks   Status Partially Met     SLP LONG TERM GOAL #3   Title Patient will complete 2 unit processing tasks with 80% accuracy without the need of repetition of task instructions or significant delays in responding.   Time 12   Period Weeks   Status Partially Met     SLP LONG TERM GOAL #4   Title Patient will demonstrate reading comprehension for sentences and paragraphs with 80% accuracy.    Time 12   Period Weeks   Status Partially Met     SLP LONG TERM GOAL #5   Title Patient will identify communication and cognitive barriers and participate in developing functional compensatory strategies.   Time 12   Period Weeks   Status Partially Met          Plan - 06/04/16 1021    Clinical Impression Statement The patient has made significant progress in many areas of communication and cognition. His most notable obstacle to effective communication at this time is the use of vague speech. The patient demonstrates emerging ability to identify and repair vague speech with clinician support.    Speech Therapy Frequency 2x / week   Duration Other (comment) - 12 weeks   Treatment/Interventions Language facilitation;Cueing hierarchy;SLP instruction and feedback;Compensatory techniques;Compensatory strategies;Internal/external aids;Multimodal communcation approach;Patient/family education   Potential to Achieve Goals Good   Potential Considerations Severity of impairments;Cooperation/participation level;Previous level of function;Co-morbidities;Ability to learn/carryover information;Family/community support   SLP Home Exercise Plan Practice using specific language   Consulted and Agree with Plan of Care Patient      Patient will benefit from skilled therapeutic intervention in order to improve  the following deficits and impairments:   Aphasia    Problem List Patient Active Problem List   Diagnosis Date Noted  . Chest pain, rule out acute myocardial infarction 04/05/2016  . Confusion 10/18/2015  . Constipation 08/23/2015  . Uncontrolled type 2 diabetes mellitus with hyperglycemia, with long-term current use of insulin ([place]) 08/23/2015  . Essential hypertension 08/23/2015  . Right lower lobe pneumonia ([place]) 08/23/2015  . Abdominal pain 08/21/2015  . Altered mental status 02/19/2015    Rickard Rhymes  Graduate Student Clinician 06/04/2016, 10:22 AM  [place] MAIN [facility] SERVICES [address] [place], [place], [address] Phone: [phone]   Fax:  [phone]   Name: Maurice Patterson MRN: [id number] Date of Birth: 06-07-1956

## 2016-06-07 ENCOUNTER — Encounter: Payer: PPO | Admitting: Speech Pathology

## 2016-06-09 ENCOUNTER — Ambulatory Visit: Payer: PPO | Admitting: Speech Pathology

## 2016-06-09 ENCOUNTER — Encounter: Payer: Self-pay | Admitting: Speech Pathology

## 2016-06-09 DIAGNOSIS — R4701 Aphasia: Secondary | ICD-10-CM

## 2016-06-09 NOTE — Therapy (Signed)
[place] [facility] MAIN [facility] SERVICES [address] [place], [place], [address] Phone: [phone]   Fax:  [phone]  Speech Language Pathology Treatment  Patient Details  Name: Maurice Patterson MRN: [id number] Date of Birth: [birth year] Referring Provider: Dr. Malvin Johns  Encounter Date: 06/09/2016      End of Session - 06/09/16 1806    Visit Number 14   Number of Visits 25   Date for SLP Re-Evaluation 07/02/16   SLP Start Time 1600   SLP Stop Time  1700   SLP Time Calculation (min) 60 min   Activity Tolerance Patient tolerated treatment well      Past Medical History:  Diagnosis Date  . Acute MI   . Arthritis   . Degenerative lumbar disc   . Diabetes mellitus without complication (HCC)   . Hernia of abdominal cavity   . Hypertension   . Malignant intraductal papillary mucinous tumor of pancreas (HCC)   . Memory loss   . Seizures ([facility])    staring spells  . Stroke ([facility])   . TIA (transient ischemic attack)     Past Surgical History:  Procedure Laterality Date  . ERCP N/A 09/12/2015   Procedure: ENDOSCOPIC RETROGRADE CHOLANGIOPANCREATOGRAPHY (ERCP);  Surgeon: Wallace Cullens, MD;  Location: [facility] ENDOSCOPY;  Service: Gastroenterology;  Laterality: N/A;  . ERCP N/A 01/02/2016   Procedure: ENDOSCOPIC RETROGRADE CHOLANGIOPANCREATOGRAPHY (ERCP);  Surgeon: Wallace Cullens, MD;  Location: [facility] ENDOSCOPY;  Service: Gastroenterology;  Laterality: N/A;  . left arm metal plate    . Left shoulder surgery    . Stent times 2  2013   Cardiac    There were no vitals filed for this visit.      Subjective Assessment - 06/09/16 1803    Subjective The patient reports that he is doing well. He participated in today's exercises with greater ease than in the previous session, which had been the first time targeting specific versus vague language in sentences.    Currently in Pain? No/denies               ADULT SLP TREATMENT - 06/09/16 0001      General  Information   Behavior/Cognition Alert;Cooperative;Pleasant mood     Treatment Provided   Treatment provided Cognitive-Linquistic     Pain Assessment   Pain Assessment No/denies pain     Cognitive-Linquistic Treatment   Treatment focused on Aphasia;Cognition;Dysarthria   Skilled Treatment EXPRESSIVE LANGUAGE: Intervention focused on replacing habitual vague language with specific language for improved communication effectiveness. When provided with a picture stimulus, the patient independently generated sentences using specific instead of vague language in 88% of attempts. When provided with cue, he repaired 50% of errors. When provided with an auditory stimulus (paragraph read aloud), the patient generated a sentence using specific instead of vague language in 75% of trials; he corrected one error without a cue, bringing the total correct to 88%. In spontaneous speech, the patient produced three instances of vague language. When the clinician wrote down his utterance, the patient was able to generate an alternative sentence using more specific language. Throughout the session, the patient demonstrated 5 instances of word retrieval errors, 3 instances of picture interpretation errors, and difficulty using prepositions (next to, beside, over, under, across from) in self-generated sentences.      Assessment / Recommendations / Plan   Plan Continue with current plan of care     Progression Toward Goals   Progression toward goals Progressing toward goals  SLP Education - 06/09/16 1805    Education provided Yes   Education Details defined and provided examples of vague speech and ways to improve communication with specific language   Person(s) Educated Patient   Methods Explanation;Demonstration   Comprehension Verbalized understanding;Returned demonstration            SLP Long Term Goals - 05/19/16 1757      SLP LONG TERM GOAL #1   Title Patient will complete mid and high  level word finding tasks with 80% accuracy.   Time 12   Period Weeks   Status Partially Met     SLP LONG TERM GOAL #2   Title Patient will generate grammatical and cogent sentence to complete simple/concrete linguistic task with 80% accuracy.   Time 12   Period Weeks   Status Partially Met     SLP LONG TERM GOAL #3   Title Patient will complete 2 unit processing tasks with 80% accuracy without the need of repetition of task instructions or significant delays in responding.   Time 12   Period Weeks   Status Partially Met     SLP LONG TERM GOAL #4   Title Patient will demonstrate reading comprehension for sentences and paragraphs with 80% accuracy.    Time 12   Period Weeks   Status Partially Met     SLP LONG TERM GOAL #5   Title Patient will identify communication and cognitive barriers and participate in developing functional compensatory strategies.   Time 12   Period Weeks   Status Partially Met          Plan - 06/09/16 1806    Clinical Impression Statement A.The patient demonstrates rapid improvement in his understanding and ability to generate specific rather than vague language in structured activities. Following an abrupt change of activity or when the patient is excited, he often reverts to use of more vague language. His emerging and growing awareness is promising for future generalization into conversational speech.    Speech Therapy Frequency 2x / week   Duration Other (comment)  12 weeks   Treatment/Interventions Language facilitation;Cueing hierarchy;SLP instruction and feedback;Compensatory techniques;Compensatory strategies;Internal/external aids;Multimodal communcation approach;Patient/family education   Potential to Achieve Goals Good   Potential Considerations Severity of impairments;Cooperation/participation level;Previous level of function;Co-morbidities;Ability to learn/carryover information;Family/community support   SLP Home Exercise Plan Practice using  specific language - written responses to worksheet prompts   Consulted and Agree with Plan of Care Patient      Patient will benefit from skilled therapeutic intervention in order to improve the following deficits and impairments:   Aphasia    Problem List Patient Active Problem List   Diagnosis Date Noted  . Chest pain, rule out acute myocardial infarction 04/05/2016  . Confusion 10/18/2015  . Constipation 08/23/2015  . Uncontrolled type 2 diabetes mellitus with hyperglycemia, with long-term current use of insulin ([place]) 08/23/2015  . Essential hypertension 08/23/2015  . Right lower lobe pneumonia ([place]) 08/23/2015  . Abdominal pain 08/21/2015  . Altered mental status 02/19/2015    Rickard Rhymes  Graduate Student Clinician 06/09/2016, 6:08 PM  [place] MAIN [facility] SERVICES [address] [place], [place], [address] Phone: [phone]   Fax:  [phone]   Name: Maurice Patterson MRN: [id number] Date of Birth: Dec 28, 1955

## 2016-06-11 ENCOUNTER — Ambulatory Visit: Payer: PPO | Admitting: Speech Pathology

## 2016-06-14 ENCOUNTER — Encounter: Payer: PPO | Admitting: Speech Pathology

## 2016-06-16 ENCOUNTER — Encounter: Payer: Self-pay | Admitting: Speech Pathology

## 2016-06-16 ENCOUNTER — Ambulatory Visit: Payer: PPO | Admitting: Speech Pathology

## 2016-06-16 DIAGNOSIS — R4701 Aphasia: Secondary | ICD-10-CM

## 2016-06-16 NOTE — Therapy (Signed)
[place] [facility] MAIN [facility] SERVICES [address] [place], [place], [address] Phone: [phone]   Fax:  [phone]  Speech Language Pathology Treatment  Patient Details  Name: Maurice Patterson MRN: [id number] Date of Birth: Dec 25, 1955 Referring Provider: Dr. Malvin Johns  Encounter Date: 06/16/2016      End of Session - 06/16/16 1819    Visit Number 15   Number of Visits 25   Date for SLP Re-Evaluation 07/02/16   SLP Start Time 1600   SLP Stop Time  1700   SLP Time Calculation (min) 60 min   Activity Tolerance Patient tolerated treatment well      Past Medical History:  Diagnosis Date  . Acute MI   . Arthritis   . Degenerative lumbar disc   . Diabetes mellitus without complication (HCC)   . Hernia of abdominal cavity   . Hypertension   . Malignant intraductal papillary mucinous tumor of pancreas (HCC)   . Memory loss   . Seizures (HCC)    staring spells  . Stroke (HCC)   . TIA (transient ischemic attack)     Past Surgical History:  Procedure Laterality Date  . ERCP N/A 09/12/2015   Procedure: ENDOSCOPIC RETROGRADE CHOLANGIOPANCREATOGRAPHY (ERCP);  Surgeon: Wallace Cullens, MD;  Location: [facility] ENDOSCOPY;  Service: Gastroenterology;  Laterality: N/A;  . ERCP N/A 01/02/2016   Procedure: ENDOSCOPIC RETROGRADE CHOLANGIOPANCREATOGRAPHY (ERCP);  Surgeon: Wallace Cullens, MD;  Location: [facility] ENDOSCOPY;  Service: Gastroenterology;  Laterality: N/A;  . left arm metal plate    . Left shoulder surgery    . Stent times 2  2013   Cardiac    There were no vitals filed for this visit.      Subjective Assessment - 06/16/16 1818    Subjective The patient reports successful communication at home.   Currently in Pain? No/denies               ADULT SLP TREATMENT - 06/16/16 0001      General Information   Behavior/Cognition Alert;Cooperative;Pleasant mood     Treatment Provided   Treatment provided Cognitive-Linquistic     Pain Assessment    Pain Assessment No/denies pain     Cognitive-Linquistic Treatment   Treatment focused on Aphasia;Cognition;Dysarthria   Skilled Treatment EXPRESSIVE LANGUAGE: Word choice, syntax, & grammar. The patient successfully identified whether a sentence produced by the clinician was "clear" (good syntax & grammar, specific language, not vague) in 2/3 trials. The patient produced "clear" sentences when provided an auditory (paragraph story) or visual (picture) stimulus with 58% on first attempt. Provided a cue, success improved to 70%. With support, the patient was able to correct approximately 85% of errors.     Assessment / Recommendations / Plan   Plan Continue with current plan of care     Progression Toward Goals   Progression toward goals Progressing toward goals          SLP Education - 06/16/16 1818    Education provided Yes   Education Details improving communication with specific language; identifying and beginning sentence with subject followed by verb; taking time to think before speaking   Person(s) Educated Patient   Methods Explanation   Comprehension Verbalized understanding            SLP Long Term Goals - 05/19/16 1757      SLP LONG TERM GOAL #1   Title Patient will complete mid and high level word finding tasks with 80% accuracy.   Time  12   Period Weeks   Status Partially Met     SLP LONG TERM GOAL #2   Title Patient will generate grammatical and cogent sentence to complete simple/concrete linguistic task with 80% accuracy.   Time 12   Period Weeks   Status Partially Met     SLP LONG TERM GOAL #3   Title Patient will complete 2 unit processing tasks with 80% accuracy without the need of repetition of task instructions or significant delays in responding.   Time 12   Period Weeks   Status Partially Met     SLP LONG TERM GOAL #4   Title Patient will demonstrate reading comprehension for sentences and paragraphs with 80% accuracy.    Time 12   Period Weeks    Status Partially Met     SLP LONG TERM GOAL #5   Title Patient will identify communication and cognitive barriers and participate in developing functional compensatory strategies.   Time 12   Period Weeks   Status Partially Met          Plan - 06/16/16 1820    Clinical Impression Statement The patient demonstrates rapid improvement in his understanding and ability to generate specific rather than vague language in structured activities. Following an abrupt change of activity or when the patient is excited, he often reverts to use of more vague language. His emerging and growing awareness is promising for future generalization into conversational speech.    Speech Therapy Frequency 2x / week   Duration Other (comment)  12 weeks   Treatment/Interventions Language facilitation;Cueing hierarchy;SLP instruction and feedback;Compensatory techniques;Compensatory strategies;Internal/external aids;Multimodal communcation approach;Patient/family education   Potential to Achieve Goals Good   Potential Considerations Severity of impairments;Cooperation/participation level;Previous level of function;Co-morbidities;Ability to learn/carryover information;Family/community support   SLP Home Exercise Plan Practice using specific language - written responses to worksheet prompts, practice with family member   Consulted and Agree with Plan of Care Patient      Patient will benefit from skilled therapeutic intervention in order to improve the following deficits and impairments:   Aphasia    Problem List Patient Active Problem List   Diagnosis Date Noted  . Chest pain, rule out acute myocardial infarction 04/05/2016  . Confusion 10/18/2015  . Constipation 08/23/2015  . Uncontrolled type 2 diabetes mellitus with hyperglycemia, with long-term current use of insulin ([place]) 08/23/2015  . Essential hypertension 08/23/2015  . Right lower lobe pneumonia ([place]) 08/23/2015  . Abdominal pain 08/21/2015  .  Altered mental status 02/19/2015    Rickard Rhymes  Graduate Student Clinician 06/16/2016, 6:21 PM  [place] MAIN [facility] SERVICES [address] [place], [place], [address] Phone: [phone]   Fax:  [phone]   Name: ELGIE MAZIARZ MRN: [id number] Date of Birth: [birth year]

## 2016-06-18 ENCOUNTER — Encounter: Payer: Self-pay | Admitting: Speech Pathology

## 2016-06-18 ENCOUNTER — Ambulatory Visit: Payer: PPO | Admitting: Speech Pathology

## 2016-06-18 DIAGNOSIS — R4701 Aphasia: Secondary | ICD-10-CM

## 2016-06-18 NOTE — Therapy (Signed)
[place] MAIN [facility] SERVICES [address] [place], [place], [address] Phone: [phone]   Fax:  [phone]  Speech Language Pathology Treatment  Patient Details  Name: Maurice Patterson MRN: [id number] Date of Birth: 1955-09-12 Referring Provider: Dr. Melrose Nakayama  Encounter Date: 06/18/2016      End of Session - 06/18/16 1253    Visit Number 16   Number of Visits 25   Date for SLP Re-Evaluation 07/02/16   SLP Start Time 90   SLP Stop Time  1200   SLP Time Calculation (min) 60 min   Activity Tolerance Patient tolerated treatment well      Past Medical History:  Diagnosis Date  . Acute MI   . Arthritis   . Degenerative lumbar disc   . Diabetes mellitus without complication ([place])   . Hernia of abdominal cavity   . Hypertension   . Malignant intraductal papillary mucinous tumor of pancreas ([place])   . Memory loss   . Seizures ([place])    staring spells  . Stroke ([place])   . TIA (transient ischemic attack)     Past Surgical History:  Procedure Laterality Date  . ERCP N/A 09/12/2015   Procedure: ENDOSCOPIC RETROGRADE CHOLANGIOPANCREATOGRAPHY (ERCP);  Surgeon: Hulen Luster, MD;  Location: [facility] ENDOSCOPY;  Service: Gastroenterology;  Laterality: N/A;  . ERCP N/A 01/02/2016   Procedure: ENDOSCOPIC RETROGRADE CHOLANGIOPANCREATOGRAPHY (ERCP);  Surgeon: Hulen Luster, MD;  Location: [facility] ENDOSCOPY;  Service: Gastroenterology;  Laterality: N/A;  . left arm metal plate    . Left shoulder surgery    . Stent times 2  2013   Cardiac    There were no vitals filed for this visit.      Subjective Assessment - 06/18/16 1252    Subjective The patient reports not feeling well today.   Currently in Pain? Other (Comment)  Patient complained of stiff / sore back.               ADULT SLP TREATMENT - 06/18/16 0001      General Information   Behavior/Cognition Alert;Cooperative;Pleasant mood     Treatment Provided   Treatment provided  Cognitive-Linquistic     Pain Assessment   Pain Assessment No/denies pain     Cognitive-Linquistic Treatment   Treatment focused on Aphasia;Cognition;Dysarthria   Skilled Treatment AUDITORY AND VISUAL MEMORY: In a task involving writing words from a word bank below their respective of 6 categories, the patient correctly and independently matched the word to its category in 98% of trials. When provided visual word bank, the patient referred to the word bank 2 to 3 times while copying words - which were often 4-6 letters in length - to their category. He reported that if he did not look at the word in the word bank, he would forget how to write it. When provided an auditory presentation of the target word without a visual, the patient successfully remembered the word long enough to write it in its category with no repetitions in 80% of trials. 33% of transcribed words contained spelling errors. When provided a target sentence with one word error, the patient quickly identified the incorrect word and provided an appropriate substitute. However, he was unable to copy the sentence and replace the incorrect word with his substitution. The patient demonstrated confusion during this task and was very slow in copying the stimulus sentence; his copies were incomplete and contained repetitive spelling errors.      Assessment / Recommendations /  Plan   Plan Continue with current plan of care     Progression Toward Goals   Progression toward goals Progressing toward goals          SLP Education - 06/18/16 1252    Education provided Yes   Education Details repeating message to self to improve auditory memory   Person(s) Educated Patient   Methods Explanation;Demonstration   Comprehension Verbalized understanding            SLP Long Term Goals - 05/19/16 1757      SLP LONG TERM GOAL #1   Title Patient will complete mid and high level word finding tasks with 80% accuracy.   Time 12   Period  Weeks   Status Partially Met     SLP LONG TERM GOAL #2   Title Patient will generate grammatical and cogent sentence to complete simple/concrete linguistic task with 80% accuracy.   Time 12   Period Weeks   Status Partially Met     SLP LONG TERM GOAL #3   Title Patient will complete 2 unit processing tasks with 80% accuracy without the need of repetition of task instructions or significant delays in responding.   Time 12   Period Weeks   Status Partially Met     SLP LONG TERM GOAL #4   Title Patient will demonstrate reading comprehension for sentences and paragraphs with 80% accuracy.    Time 12   Period Weeks   Status Partially Met     SLP LONG TERM GOAL #5   Title Patient will identify communication and cognitive barriers and participate in developing functional compensatory strategies.   Time 12   Period Weeks   Status Partially Met          Plan - 06/18/16 1254    Clinical Impression Statement When the patient formulates a clear sentence verbally, he forgets the target sentence before finishing writing it down (after writing 1-2 words). This contributes to unclear written communication. Improvements in auditory and visual memory may enable him to retain communicative idea long enough to transfer it into another mode (e.g., from verbal production to written).    Speech Therapy Frequency 2x / week   Duration Other (comment)  12 weeks   Treatment/Interventions Language facilitation;Cueing hierarchy;SLP instruction and feedback;Compensatory techniques;Compensatory strategies;Internal/external aids;Multimodal communcation approach;Patient/family education   Potential to Achieve Goals Good   Potential Considerations Severity of impairments;Cooperation/participation level;Previous level of function;Co-morbidities;Ability to learn/carryover information;Family/community support   SLP Home Exercise Plan opposites; matching words to category   Consulted and Agree with Plan of Care  Patient      Patient will benefit from skilled therapeutic intervention in order to improve the following deficits and impairments:   Aphasia    Problem List Patient Active Problem List   Diagnosis Date Noted  . Chest pain, rule out acute myocardial infarction 04/05/2016  . Confusion 10/18/2015  . Constipation 08/23/2015  . Uncontrolled type 2 diabetes mellitus with hyperglycemia, with long-term current use of insulin ([place]) 08/23/2015  . Essential hypertension 08/23/2015  . Right lower lobe pneumonia ([place]) 08/23/2015  . Abdominal pain 08/21/2015  . Altered mental status 02/19/2015    Rickard Rhymes  Graduate Student Clinician 06/18/2016, 12:55 PM  [place] MAIN [facility] SERVICES [address] [place], [place], [address] Phone: [phone]   Fax:  [phone]   Name: Maurice Patterson MRN: [id number] Date of Birth: [birth year]

## 2016-06-21 ENCOUNTER — Encounter: Payer: PPO | Admitting: Speech Pathology

## 2016-06-23 ENCOUNTER — Ambulatory Visit: Payer: PPO | Attending: Neurology | Admitting: Speech Pathology

## 2016-06-23 DIAGNOSIS — R4701 Aphasia: Secondary | ICD-10-CM | POA: Insufficient documentation

## 2016-06-23 DIAGNOSIS — R41841 Cognitive communication deficit: Secondary | ICD-10-CM | POA: Diagnosis not present

## 2016-06-23 DIAGNOSIS — R471 Dysarthria and anarthria: Secondary | ICD-10-CM | POA: Insufficient documentation

## 2016-06-24 ENCOUNTER — Encounter: Payer: Self-pay | Admitting: Speech Pathology

## 2016-06-24 DIAGNOSIS — R404 Transient alteration of awareness: Secondary | ICD-10-CM | POA: Diagnosis not present

## 2016-06-24 DIAGNOSIS — E1165 Type 2 diabetes mellitus with hyperglycemia: Secondary | ICD-10-CM | POA: Diagnosis not present

## 2016-06-24 DIAGNOSIS — G479 Sleep disorder, unspecified: Secondary | ICD-10-CM | POA: Diagnosis not present

## 2016-06-24 DIAGNOSIS — E876 Hypokalemia: Secondary | ICD-10-CM | POA: Diagnosis not present

## 2016-06-24 DIAGNOSIS — Z8673 Personal history of transient ischemic attack (TIA), and cerebral infarction without residual deficits: Secondary | ICD-10-CM | POA: Diagnosis not present

## 2016-06-24 DIAGNOSIS — Z794 Long term (current) use of insulin: Secondary | ICD-10-CM | POA: Diagnosis not present

## 2016-06-24 DIAGNOSIS — Z8669 Personal history of other diseases of the nervous system and sense organs: Secondary | ICD-10-CM | POA: Diagnosis not present

## 2016-06-24 DIAGNOSIS — E118 Type 2 diabetes mellitus with unspecified complications: Secondary | ICD-10-CM | POA: Diagnosis not present

## 2016-06-24 DIAGNOSIS — Z125 Encounter for screening for malignant neoplasm of prostate: Secondary | ICD-10-CM | POA: Diagnosis not present

## 2016-06-24 NOTE — Therapy (Signed)
[place] MAIN [facility] SERVICES [address] [place], [place], [address] Phone: [phone]   Fax:  [phone]  Speech Language Pathology Treatment  Patient Details  Name: Maurice Patterson MRN: [id number] Date of Birth: [birth year] Referring Provider: Dr. Melrose Nakayama  Encounter Date: 06/23/2016      End of Session - 06/24/16 1011    Visit Number 17   Number of Visits 25   Date for SLP Re-Evaluation 07/02/16   SLP Start Time 21   SLP Stop Time  1656   SLP Time Calculation (min) 56 min      Past Medical History:  Diagnosis Date  . Acute MI   . Arthritis   . Degenerative lumbar disc   . Diabetes mellitus without complication ([place])   . Hernia of abdominal cavity   . Hypertension   . Malignant intraductal papillary mucinous tumor of pancreas ([place])   . Memory loss   . Seizures ([place])    staring spells  . Stroke ([place])   . TIA (transient ischemic attack)     Past Surgical History:  Procedure Laterality Date  . ERCP N/A 09/12/2015   Procedure: ENDOSCOPIC RETROGRADE CHOLANGIOPANCREATOGRAPHY (ERCP);  Surgeon: Hulen Luster, MD;  Location: [facility] ENDOSCOPY;  Service: Gastroenterology;  Laterality: N/A;  . ERCP N/A 01/02/2016   Procedure: ENDOSCOPIC RETROGRADE CHOLANGIOPANCREATOGRAPHY (ERCP);  Surgeon: Hulen Luster, MD;  Location: [facility] ENDOSCOPY;  Service: Gastroenterology;  Laterality: N/A;  . left arm metal plate    . Left shoulder surgery    . Stent times 2  2013   Cardiac    There were no vitals filed for this visit.      Subjective Assessment - 06/24/16 1010    Subjective Patient was frustrated with difficulty of auditory tasks               ADULT SLP TREATMENT - 06/24/16 0001      General Information   Behavior/Cognition Alert;Cooperative;Pleasant mood     Treatment Provided   Treatment provided Cognitive-Linquistic     Pain Assessment   Pain Assessment No/denies pain     Cognitive-Linquistic Treatment   Treatment focused  on Aphasia;Cognition;Dysarthria   Skilled Treatment AUDITORY ATTENTION/MEMORY/COMPREHENSION:  Repeat 3-word list given multiple repetitions of stimulus.  Repeat 3-6 word sentences with 75% accuracy; improves to 95% accuracy given up to 2 repetitions of the stimulus.  Given statement, state main idea with 40% accuracy independently and 70% given SLP cues.  The patient had significant difficulty generating complete responses to complex Wh-questions.     Assessment / Recommendations / Plan   Plan Continue with current plan of care     Progression Toward Goals   Progression toward goals Progressing toward goals          SLP Education - 06/24/16 1011    Education provided Yes   Education Details repeat infromation presented auditorily to insure comprehension   Person(s) Educated Patient   Methods Explanation   Comprehension Verbalized understanding            SLP Long Term Goals - 05/19/16 1757      SLP LONG TERM GOAL #1   Title Patient will complete mid and high level word finding tasks with 80% accuracy.   Time 12   Period Weeks   Status Partially Met     SLP LONG TERM GOAL #2   Title Patient will generate grammatical and cogent sentence to complete simple/concrete linguistic task with 80% accuracy.  Time 12   Period Weeks   Status Partially Met     SLP LONG TERM GOAL #3   Title Patient will complete 2 unit processing tasks with 80% accuracy without the need of repetition of task instructions or significant delays in responding.   Time 12   Period Weeks   Status Partially Met     SLP LONG TERM GOAL #4   Title Patient will demonstrate reading comprehension for sentences and paragraphs with 80% accuracy.    Time 12   Period Weeks   Status Partially Met     SLP LONG TERM GOAL #5   Title Patient will identify communication and cognitive barriers and participate in developing functional compensatory strategies.   Time 12   Period Weeks   Status Partially Met           Plan - 06/24/16 1012    Clinical Impression Statement The patient continues to be less confused and able to complete more cognitive-linguistic tasks in the Tx session.  He demonstrates increased insight into his cognitive function.  Patient is requiring fewer cues to slow speech sufficiently to be understood.  The patient demonstrates improved sustained attention.  Visual perceptual skills, reading comprehension, and auditory memory/comprehension are improved, but require further work.   Speech Therapy Frequency 2x / week   Duration Other (comment)   Treatment/Interventions Language facilitation;Cueing hierarchy;SLP instruction and feedback;Compensatory techniques;Compensatory strategies;Internal/external aids;Multimodal communcation approach;Patient/family education   Potential to Achieve Goals Good   Potential Considerations Severity of impairments;Cooperation/participation level;Previous level of function;Co-morbidities;Ability to learn/carryover information;Family/community support   SLP Home Exercise Plan Auditory closure task   Consulted and Agree with Plan of Care Patient      Patient will benefit from skilled therapeutic intervention in order to improve the following deficits and impairments:   Aphasia  Cognitive communication deficit  Dysarthria and anarthria    Problem List Patient Active Problem List   Diagnosis Date Noted  . Chest pain, rule out acute myocardial infarction 04/05/2016  . Confusion 10/18/2015  . Constipation 08/23/2015  . Uncontrolled type 2 diabetes mellitus with hyperglycemia, with long-term current use of insulin ([place]) 08/23/2015  . Essential hypertension 08/23/2015  . Right lower lobe pneumonia ([place]) 08/23/2015  . Abdominal pain 08/21/2015  . Altered mental status 02/19/2015   Leroy Sea, MS/CCC- SLP  Lou Miner 06/24/2016, 10:14 AM  [place] [address]  [place], [place], [address] Phone: [phone]   Fax:  [phone]   Name: Maurice Patterson MRN: [id number] Date of Birth: [birth year]

## 2016-06-25 ENCOUNTER — Ambulatory Visit: Payer: PPO | Admitting: Speech Pathology

## 2016-06-25 ENCOUNTER — Encounter: Payer: Self-pay | Admitting: Speech Pathology

## 2016-06-25 DIAGNOSIS — R4701 Aphasia: Secondary | ICD-10-CM

## 2016-06-25 NOTE — Therapy (Signed)
[place] MAIN [facility] SERVICES [address] [place], [place], [address] Phone: [phone]   Fax:  [phone]  Speech Language Pathology Treatment  Patient Details  Name: Maurice Patterson MRN: [id number] Date of Birth: 1956/07/14 Referring Provider: Dr. Melrose Nakayama  Encounter Date: 06/25/2016      End of Session - 06/25/16 1346    Visit Number 18   Number of Visits 25   Date for SLP Re-Evaluation 07/02/16   SLP Start Time 0855   SLP Stop Time  0955   SLP Time Calculation (min) 60 min   Activity Tolerance Patient tolerated treatment well      Past Medical History:  Diagnosis Date  . Acute MI   . Arthritis   . Degenerative lumbar disc   . Diabetes mellitus without complication ([place])   . Hernia of abdominal cavity   . Hypertension   . Malignant intraductal papillary mucinous tumor of pancreas ([place])   . Memory loss   . Seizures ([place])    staring spells  . Stroke ([place])   . TIA (transient ischemic attack)     Past Surgical History:  Procedure Laterality Date  . ERCP N/A 09/12/2015   Procedure: ENDOSCOPIC RETROGRADE CHOLANGIOPANCREATOGRAPHY (ERCP);  Surgeon: Hulen Luster, MD;  Location: [facility] ENDOSCOPY;  Service: Gastroenterology;  Laterality: N/A;  . ERCP N/A 01/02/2016   Procedure: ENDOSCOPIC RETROGRADE CHOLANGIOPANCREATOGRAPHY (ERCP);  Surgeon: Hulen Luster, MD;  Location: [facility] ENDOSCOPY;  Service: Gastroenterology;  Laterality: N/A;  . left arm metal plate    . Left shoulder surgery    . Stent times 2  2013   Cardiac    There were no vitals filed for this visit.      Subjective Assessment - 06/25/16 1345    Subjective  The patient report that he was feeling better today. He reported some confusion about the homework activity. In spontaneous conversation, the patient demonstrated an instance of vague language and required multiple prompts and leading questions to discern the intended message.    Currently in Pain? No/denies                ADULT SLP TREATMENT - 06/25/16 0001      General Information   Behavior/Cognition Alert;Cooperative;Pleasant mood     Treatment Provided   Treatment provided Cognitive-Linquistic     Pain Assessment   Pain Assessment No/denies pain     Cognitive-Linquistic Treatment   Treatment focused on Aphasia;Cognition;Dysarthria   Skilled Treatment AUDITORY COMPREHENSION & WORD-FINDING: The patient and clinician completed the homework activity together: auditory closure (completing sentences presented auditorily with appropriate word) with simple sentences. The patient achieved 100% success in predictable sentences, only requesting repetition for 1 item. The patient achieved 92% success in unpredictable sentences when provided repetition on 2 items; success increased to 100% when provided verbal cues. The patient completed a "parts to whole" activity in which he provided a target word associated with three related words presented verbally by clinician. He completed this task with 58% accuracy. The patient was unsuccessful in answering comprehension questions about simple four-line poems. However, when probed for ability to answer comprehension questions about simple stories comprised of 3 short sentences, the patient correctly answered 2/3 questions and answered the third correctly after a repetition of the story. AUDITORY MEMORY: The patient consistently successfully repeated utterances of 3-5 words and sentences of 7 or fewer syllables. The patient repeated sentences of 9-11 syllables with 67% success. When clinician spelled words verbally, the patient  was consistently able to name words of 3-5 letters in length unless the word contained a diphthong or consonant blend. Errors were noted in ability to name words containing "ch," "sh," "st," and "ck;" success was achieved on probes containing "dr," "th," vowel + "r," and silent "e" after consonant. For words the patient could not identify from  verbal spelling, when he wrote down the letters recited by clinician, he was able to read and recognize the resulting word.       Assessment / Recommendations / Plan   Plan Continue with current plan of care     Progression Toward Goals   Progression toward goals Progressing toward goals          SLP Education - 06/25/16 1346    Education provided Yes   Education Details repeat information presented auditorily to insure comprehension   Person(s) Educated Patient   Methods Explanation   Comprehension Verbalized understanding            SLP Long Term Goals - 05/19/16 1757      SLP LONG TERM GOAL #1   Title Patient will complete mid and high level word finding tasks with 80% accuracy.   Time 12   Period Weeks   Status Partially Met     SLP LONG TERM GOAL #2   Title Patient will generate grammatical and cogent sentence to complete simple/concrete linguistic task with 80% accuracy.   Time 12   Period Weeks   Status Partially Met     SLP LONG TERM GOAL #3   Title Patient will complete 2 unit processing tasks with 80% accuracy without the need of repetition of task instructions or significant delays in responding.   Time 12   Period Weeks   Status Partially Met     SLP LONG TERM GOAL #4   Title Patient will demonstrate reading comprehension for sentences and paragraphs with 80% accuracy.    Time 12   Period Weeks   Status Partially Met     SLP LONG TERM GOAL #5   Title Patient will identify communication and cognitive barriers and participate in developing functional compensatory strategies.   Time 12   Period Weeks   Status Partially Met          Plan - 06/25/16 1347    Clinical Impression Statement The patient continues to be less confused and able to complete more cognitive-linguistic tasks in the Tx session.  He demonstrates increased insight into his cognitive function.  Patient is requiring fewer cues to slow speech sufficiently to be understood.  The  patient demonstrates improved sustained attention.  Visual perceptual skills, reading comprehension, and auditory memory/comprehension are improved, but require further work.   Speech Therapy Frequency 2x / week   Duration Other (comment)  12 weeks   Treatment/Interventions Language facilitation;Cueing hierarchy;SLP instruction and feedback;Compensatory techniques;Compensatory strategies;Internal/external aids;Multimodal communcation approach;Patient/family education   Potential to Achieve Goals Good   Potential Considerations Severity of impairments;Cooperation/participation level;Previous level of function;Co-morbidities;Ability to learn/carryover information;Family/community support   SLP Home Exercise Plan Listening comprehension task - 3-sentence stories   Consulted and Agree with Plan of Care Patient      Patient will benefit from skilled therapeutic intervention in order to improve the following deficits and impairments:   Aphasia    Problem List Patient Active Problem List   Diagnosis Date Noted  . Chest pain, rule out acute myocardial infarction 04/05/2016  . Confusion 10/18/2015  . Constipation 08/23/2015  . Uncontrolled type 2  diabetes mellitus with hyperglycemia, with long-term current use of insulin ([place]) 08/23/2015  . Essential hypertension 08/23/2015  . Right lower lobe pneumonia ([place]) 08/23/2015  . Abdominal pain 08/21/2015  . Altered mental status 02/19/2015    Rickard Rhymes  Graduate Student Clinician 06/25/2016, 1:49 PM  [place] [facility] MAIN [facility] SERVICES [address] [place], [place], [address] Phone: [phone]   Fax:  [phone]   Name: AVNER STRODER MRN: [id number] Date of Birth: 01/05/1956

## 2016-06-29 ENCOUNTER — Ambulatory Visit: Payer: PPO | Admitting: Speech Pathology

## 2016-06-29 DIAGNOSIS — R471 Dysarthria and anarthria: Secondary | ICD-10-CM

## 2016-06-29 DIAGNOSIS — R4701 Aphasia: Secondary | ICD-10-CM | POA: Diagnosis not present

## 2016-06-29 DIAGNOSIS — R41841 Cognitive communication deficit: Secondary | ICD-10-CM

## 2016-06-30 ENCOUNTER — Encounter: Payer: Self-pay | Admitting: Speech Pathology

## 2016-06-30 NOTE — Therapy (Signed)
[place] MAIN [facility] SERVICES [address] [place], [place], [address] Phone: [phone]   Fax:  [phone]  Speech Language Pathology Treatment  Patient Details  Name: Maurice Patterson MRN: [id number] Date of Birth: 29-Jan-1956 Referring Provider: Dr. Melrose Nakayama  Encounter Date: 06/29/2016      End of Session - 06/30/16 1128    Visit Number 19   Number of Visits 25   Date for SLP Re-Evaluation 07/02/16   SLP Start Time 1600   SLP Stop Time  3295   SLP Time Calculation (min) 58 min   Activity Tolerance Patient tolerated treatment well      Past Medical History:  Diagnosis Date  . Acute MI   . Arthritis   . Degenerative lumbar disc   . Diabetes mellitus without complication ([place])   . Hernia of abdominal cavity   . Hypertension   . Malignant intraductal papillary mucinous tumor of pancreas ([place])   . Memory loss   . Seizures ([place])    staring spells  . Stroke ([place])   . TIA (transient ischemic attack)     Past Surgical History:  Procedure Laterality Date  . ERCP N/A 09/12/2015   Procedure: ENDOSCOPIC RETROGRADE CHOLANGIOPANCREATOGRAPHY (ERCP);  Surgeon: Hulen Luster, MD;  Location: [facility] ENDOSCOPY;  Service: Gastroenterology;  Laterality: N/A;  . ERCP N/A 01/02/2016   Procedure: ENDOSCOPIC RETROGRADE CHOLANGIOPANCREATOGRAPHY (ERCP);  Surgeon: Hulen Luster, MD;  Location: [facility] ENDOSCOPY;  Service: Gastroenterology;  Laterality: N/A;  . left arm metal plate    . Left shoulder surgery    . Stent times 2  2013   Cardiac    There were no vitals filed for this visit.      Subjective Assessment - 06/30/16 1127    Subjective The patient reports feeling well today.               ADULT SLP TREATMENT - 06/30/16 0001      General Information   Behavior/Cognition Alert;Cooperative;Pleasant mood     Treatment Provided   Treatment provided Cognitive-Linquistic     Pain Assessment   Pain Assessment No/denies pain     Cognitive-Linquistic Treatment   Treatment focused on Aphasia;Cognition;Dysarthria   Skilled Treatment AUDITORY ATTENTION/MEMORY/COMPREHENSION:  Repeat 2-word list with 90% accuracy.  Repeat 3-6 word sentences with 75% accuracy; improves to 95% accuracy given up to 2 repetitions of the stimulus.  Answer 3 Wh-questions RE: 3 sentences read aloud with 55% accuracy independently and 70% given SLP cues.  State whole given 3 parts (presented auditorily) with 60% accuracy.  Answer simple yes/no questions rapidly with 90% accuracy.  Answer Which-questions with 70 % accuracy.     Assessment / Recommendations / Plan   Plan Continue with current plan of care     Progression Toward Goals   Progression toward goals Progressing toward goals          SLP Education - 06/30/16 1128    Education provided Yes   Education Details Need to practice listening skills   Person(s) Educated Patient   Methods Explanation   Comprehension Verbalized understanding;Need further instruction            SLP Long Term Goals - 05/19/16 1757      SLP LONG TERM GOAL #1   Title Patient will complete mid and high level word finding tasks with 80% accuracy.   Time 12   Period Weeks   Status Partially Met     SLP LONG TERM  GOAL #2   Title Patient will generate grammatical and cogent sentence to complete simple/concrete linguistic task with 80% accuracy.   Time 12   Period Weeks   Status Partially Met     SLP LONG TERM GOAL #3   Title Patient will complete 2 unit processing tasks with 80% accuracy without the need of repetition of task instructions or significant delays in responding.   Time 12   Period Weeks   Status Partially Met     SLP LONG TERM GOAL #4   Title Patient will demonstrate reading comprehension for sentences and paragraphs with 80% accuracy.    Time 12   Period Weeks   Status Partially Met     SLP LONG TERM GOAL #5   Title Patient will identify communication and cognitive barriers and  participate in developing functional compensatory strategies.   Time 12   Period Weeks   Status Partially Met          Plan - 06/30/16 1129    Clinical Impression Statement The patient continues to be less confused and able to complete more cognitive-linguistic tasks in the Tx session.  He demonstrates increased insight into his cognitive function.  Patient is requiring fewer cues to slow speech sufficiently to be understood.  The patient demonstrates improved sustained attention.  Visual perceptual skills, reading comprehension, and auditory memory/comprehension are improved, but require further work.   Speech Therapy Frequency 2x / week   Duration Other (comment)   Treatment/Interventions Language facilitation;Cueing hierarchy;SLP instruction and feedback;Compensatory techniques;Compensatory strategies;Internal/external aids;Multimodal communcation approach;Patient/family education   Potential to Achieve Goals Good   Potential Considerations Severity of impairments;Cooperation/participation level;Previous level of function;Co-morbidities;Ability to learn/carryover information;Family/community support   SLP Home Exercise Plan Listening comprehension task - 3-sentence stories   Consulted and Agree with Plan of Care Patient      Patient will benefit from skilled therapeutic intervention in order to improve the following deficits and impairments:   Aphasia  Cognitive communication deficit  Dysarthria and anarthria    Problem List Patient Active Problem List   Diagnosis Date Noted  . Chest pain, rule out acute myocardial infarction 04/05/2016  . Confusion 10/18/2015  . Constipation 08/23/2015  . Uncontrolled type 2 diabetes mellitus with hyperglycemia, with long-term current use of insulin ([place]) 08/23/2015  . Essential hypertension 08/23/2015  . Right lower lobe pneumonia ([place]) 08/23/2015  . Abdominal pain 08/21/2015  . Altered mental status 02/19/2015   Leroy Sea,  MS/CCC- SLP  Lou Miner 06/30/2016, 11:30 AM  [place] MAIN [facility] SERVICES [address] [place], [place], [address] Phone: [phone]   Fax:  [phone]   Name: Maurice Patterson MRN: [id number] Date of Birth: 1956/03/13

## 2016-07-01 ENCOUNTER — Ambulatory Visit: Payer: PPO | Admitting: Speech Pathology

## 2016-07-01 DIAGNOSIS — R41841 Cognitive communication deficit: Secondary | ICD-10-CM

## 2016-07-01 DIAGNOSIS — R4701 Aphasia: Secondary | ICD-10-CM | POA: Diagnosis not present

## 2016-07-01 DIAGNOSIS — R471 Dysarthria and anarthria: Secondary | ICD-10-CM

## 2016-07-02 ENCOUNTER — Encounter: Payer: Self-pay | Admitting: Speech Pathology

## 2016-07-02 NOTE — Therapy (Signed)
[place] MAIN [facility] SERVICES [address] [place], [place], [address] Phone: [phone]   Fax:  [phone]  Speech Language Pathology Treatment/Progress Note  Patient Details  Name: Maurice Patterson MRN: [id number] Date of Birth: [birth year] Referring Provider: Dr. Melrose Nakayama  Encounter Date: 07/01/2016      End of Session - 07/02/16 0845    Visit Number 20   Number of Visits 25   Date for SLP Re-Evaluation 07/02/16   SLP Start Time 1000   SLP Stop Time  1100   SLP Time Calculation (min) 60 min   Activity Tolerance Patient tolerated treatment well      Past Medical History:  Diagnosis Date  . Acute MI   . Arthritis   . Degenerative lumbar disc   . Diabetes mellitus without complication ([place])   . Hernia of abdominal cavity   . Hypertension   . Malignant intraductal papillary mucinous tumor of pancreas ([place])   . Memory loss   . Seizures ([place])    staring spells  . Stroke ([place])   . TIA (transient ischemic attack)     Past Surgical History:  Procedure Laterality Date  . ERCP N/A 09/12/2015   Procedure: ENDOSCOPIC RETROGRADE CHOLANGIOPANCREATOGRAPHY (ERCP);  Surgeon: Hulen Luster, MD;  Location: [facility] ENDOSCOPY;  Service: Gastroenterology;  Laterality: N/A;  . ERCP N/A 01/02/2016   Procedure: ENDOSCOPIC RETROGRADE CHOLANGIOPANCREATOGRAPHY (ERCP);  Surgeon: Hulen Luster, MD;  Location: [facility] ENDOSCOPY;  Service: Gastroenterology;  Laterality: N/A;  . left arm metal plate    . Left shoulder surgery    . Stent times 2  2013   Cardiac    There were no vitals filed for this visit.      Subjective Assessment - 07/02/16 0844    Subjective The patient reports feeling well today.   Currently in Pain? No/denies               ADULT SLP TREATMENT - 07/02/16 0001      General Information   Behavior/Cognition Alert;Cooperative;Pleasant mood     Treatment Provided   Treatment provided Cognitive-Linquistic     Pain Assessment   Pain  Assessment No/denies pain     Cognitive-Linquistic Treatment   Treatment focused on Aphasia;Cognition;Dysarthria   Skilled Treatment VERBAL EXPRESSION: Generate cogent/grammatical/intelligible sentence given complex picture stimulus with 70% accuracy.  Errors are unintelligible utterances (imprecise articulation and rapid rate), unrecognized semantic paraphasia, grammatical errors, and word finding errors.  State example of an intangible with 70% accuracy.  Patient demonstrates fewer off task remarks, which had been confusing in combination with task at hand. AUDITORY ATTENTION/MEMORY/COMPREHENSION:  Repeat 2-word list with 90% accuracy.  Repeat 3-6 word sentences with 75% accuracy; improves to 95% accuracy given up to 2 repetitions of the stimulus.  Answer 3 Wh-questions RE: 3 sentences read aloud with 55% accuracy independently and 70% given SLP cues.  State whole given 3 parts (presented auditorily) with 60% accuracy.  Answer simple yes/no questions rapidly with 90% accuracy.  Answer Which-questions with 70 % accuracy.     Assessment / Recommendations / Plan   Plan Continue with current plan of care     Progression Toward Goals   Progression toward goals Progressing toward goals          SLP Education - 07/02/16 0845    Education provided Yes   Education Details Practice listening skills   Person(s) Educated Patient   Methods Explanation   Comprehension Verbalized understanding  SLP Long Term Goals - 07-23-2016 0846      SLP LONG TERM GOAL #1   Title Patient will complete mid and high level word finding tasks with 80% accuracy.   Time 12   Period Weeks   Status Partially Met     SLP LONG TERM GOAL #2   Title Patient will generate grammatical and cogent sentence to complete simple/concrete linguistic task with 80% accuracy.   Time 12   Period Weeks   Status Partially Met     SLP LONG TERM GOAL #3   Title Patient will complete 2 unit processing tasks with 80%  accuracy without the need of repetition of task instructions or significant delays in responding.   Time 12   Period Weeks   Status Partially Met     SLP LONG TERM GOAL #4   Title Patient will demonstrate reading comprehension for sentences and paragraphs with 80% accuracy.    Time 12   Period Weeks   Status Partially Met     SLP LONG TERM GOAL #5   Title Patient will identify communication and cognitive barriers and participate in developing functional compensatory strategies.   Time 12   Period Weeks   Status Partially Met          Plan - Jul 23, 2016 0845    Clinical Impression Statement The patient continues to be less confused and able to complete more cognitive-linguistic tasks in the Tx session.  He demonstrates increased insight into his cognitive function.  Patient is requiring fewer cues to slow speech sufficiently to be understood.  The patient demonstrates improved sustained attention.  Visual perceptual skills, reading comprehension, and auditory memory/comprehension are improved, but require further work.   Speech Therapy Frequency 2x / week   Duration Other (comment)   Treatment/Interventions Language facilitation;Cueing hierarchy;SLP instruction and feedback;Compensatory techniques;Compensatory strategies;Internal/external aids;Multimodal communcation approach;Patient/family education   Potential to Achieve Goals Good   Potential Considerations Severity of impairments;Cooperation/participation level;Previous level of function;Co-morbidities;Ability to learn/carryover information;Family/community support   SLP Home Exercise Plan Listening comprehension task - repeat short sentences   Consulted and Agree with Plan of Care Patient      Patient will benefit from skilled therapeutic intervention in order to improve the following deficits and impairments:   Cognitive communication deficit  Aphasia  Dysarthria and anarthria      G-Codes - 2016-07-23 0847    Functional  Assessment Tool Used Clinical judgment   Functional Limitations Spoken language expressive   Spoken Language Expression Current Status [phone]) At least 20 percent but less than 40 percent impaired, limited or restricted   Spoken Language Expression Goal Status ([id number]) At least 1 percent but less than 20 percent impaired, limited or restricted      Problem List Patient Active Problem List   Diagnosis Date Noted  . Chest pain, rule out acute myocardial infarction 04/05/2016  . Confusion 10/18/2015  . Constipation 08/23/2015  . Uncontrolled type 2 diabetes mellitus with hyperglycemia, with long-term current use of insulin ([place]) 08/23/2015  . Essential hypertension 08/23/2015  . Right lower lobe pneumonia ([place]) 08/23/2015  . Abdominal pain 08/21/2015  . Altered mental status 02/19/2015   Leroy Sea, MS/CCC- SLP  Lou Miner July 23, 2016, 8:47 AM  [place] MAIN [facility] SERVICES [address] [place], [place], [address] Phone: [phone]   Fax:  [phone]   Name: Maurice Patterson MRN: [id number] Date of Birth: [birth year]

## 2016-07-06 ENCOUNTER — Ambulatory Visit: Payer: PPO | Admitting: Speech Pathology

## 2016-07-06 DIAGNOSIS — R41841 Cognitive communication deficit: Secondary | ICD-10-CM

## 2016-07-06 DIAGNOSIS — R4701 Aphasia: Secondary | ICD-10-CM

## 2016-07-06 DIAGNOSIS — R471 Dysarthria and anarthria: Secondary | ICD-10-CM

## 2016-07-07 ENCOUNTER — Encounter: Payer: Self-pay | Admitting: Speech Pathology

## 2016-07-07 NOTE — Therapy (Signed)
[place] MAIN [facility] SERVICES [address] [place], [place], [address] Phone: [phone]   Fax:  [phone]  Speech Language Pathology Treatment  Patient Details  Name: Maurice Patterson MRN: [id number] Date of Birth: [birth year] Referring Provider: Dr. Melrose Nakayama  Encounter Date: 07/06/2016      End of Session - 07/07/16 0956    Visit Number 21   Number of Visits 25   Date for SLP Re-Evaluation 07/16/16   SLP Start Time 1600   SLP Stop Time  1655   SLP Time Calculation (min) 55 min   Activity Tolerance Patient tolerated treatment well      Past Medical History:  Diagnosis Date  . Acute MI   . Arthritis   . Degenerative lumbar disc   . Diabetes mellitus without complication ([place])   . Hernia of abdominal cavity   . Hypertension   . Malignant intraductal papillary mucinous tumor of pancreas ([place])   . Memory loss   . Seizures ([place])    staring spells  . Stroke ([place])   . TIA (transient ischemic attack)     Past Surgical History:  Procedure Laterality Date  . ERCP N/A 09/12/2015   Procedure: ENDOSCOPIC RETROGRADE CHOLANGIOPANCREATOGRAPHY (ERCP);  Surgeon: Hulen Luster, MD;  Location: [facility] ENDOSCOPY;  Service: Gastroenterology;  Laterality: N/A;  . ERCP N/A 01/02/2016   Procedure: ENDOSCOPIC RETROGRADE CHOLANGIOPANCREATOGRAPHY (ERCP);  Surgeon: Hulen Luster, MD;  Location: [facility] ENDOSCOPY;  Service: Gastroenterology;  Laterality: N/A;  . left arm metal plate    . Left shoulder surgery    . Stent times 2  2013   Cardiac    There were no vitals filed for this visit.      Subjective Assessment - 07/07/16 0953    Subjective The patient's wife called 07/07/2016, the morning after this session, to report that the patient was demonstrating significant mental status charge when she picked him up after therapy.  His blood sugar was very low and did not come up significantly even given sugar.  She states that the patient has not slept all night and  is acting oddly.  She was assured that the patient was his usual mental status during our session.    Currently in Pain? No/denies               ADULT SLP TREATMENT - 07/07/16 0001      General Information   Behavior/Cognition Alert;Cooperative;Pleasant mood     Treatment Provided   Treatment provided Cognitive-Linquistic     Pain Assessment   Pain Assessment No/denies pain     Cognitive-Linquistic Treatment   Treatment focused on Aphasia;Cognition;Dysarthria   Skilled Treatment VERBAL EXPRESSION: Generate cogent/grammatical/intelligible sentence given complex picture stimulus with 70% accuracy.  Errors are unintelligible utterances (imprecise articulation and rapid rate), unrecognized semantic paraphasia, grammatical errors, and word finding errors.  State example of an intangible with 70% accuracy.  Patient demonstrates fewer off task remarks, which had been confusing in combination with task at hand. AUDITORY ATTENTION/MEMORY/COMPREHENSION:  Listen to sentence, answer question using 2 words within the sentence with 80% accuracy.     Assessment / Recommendations / Plan   Plan Continue with current plan of care     Progression Toward Goals   Progression toward goals Progressing toward goals          SLP Education - 07/07/16 0956    Education provided Yes   Education Details Practice listening skills   Person(s) Educated Patient  Methods Explanation   Comprehension Verbalized understanding            SLP Long Term Goals - 07/02/16 0846      SLP LONG TERM GOAL #1   Title Patient will complete mid and high level word finding tasks with 80% accuracy.   Time 12   Period Weeks   Status Partially Met     SLP LONG TERM GOAL #2   Title Patient will generate grammatical and cogent sentence to complete simple/concrete linguistic task with 80% accuracy.   Time 12   Period Weeks   Status Partially Met     SLP LONG TERM GOAL #3   Title Patient will complete 2 unit  processing tasks with 80% accuracy without the need of repetition of task instructions or significant delays in responding.   Time 12   Period Weeks   Status Partially Met     SLP LONG TERM GOAL #4   Title Patient will demonstrate reading comprehension for sentences and paragraphs with 80% accuracy.    Time 12   Period Weeks   Status Partially Met     SLP LONG TERM GOAL #5   Title Patient will identify communication and cognitive barriers and participate in developing functional compensatory strategies.   Time 12   Period Weeks   Status Partially Met          Plan - 07/07/16 0957    Clinical Impression Statement The patient continues to be less confused and able to complete more cognitive-linguistic tasks in the Tx session.  He demonstrates increased insight into his cognitive function.  Patient is requiring fewer cues to slow speech sufficiently to be understood.  The patient demonstrates improved sustained attention.  Visual perceptual skills, reading comprehension, and auditory memory/comprehension are improved, but require further work.   Speech Therapy Frequency 2x / week   Duration Other (comment)   Treatment/Interventions Language facilitation;Cueing hierarchy;SLP instruction and feedback;Compensatory techniques;Compensatory strategies;Internal/external aids;Multimodal communcation approach;Patient/family education   Potential to Achieve Goals Good   Potential Considerations Severity of impairments;Cooperation/participation level;Previous level of function;Co-morbidities;Ability to learn/carryover information;Family/community support   SLP Home Exercise Plan Reading comprehension task - complete sentences   Consulted and Agree with Plan of Care Patient      Patient will benefit from skilled therapeutic intervention in order to improve the following deficits and impairments:   Cognitive communication deficit  Aphasia  Dysarthria and anarthria    Problem List Patient  Active Problem List   Diagnosis Date Noted  . Chest pain, rule out acute myocardial infarction 04/05/2016  . Confusion 10/18/2015  . Constipation 08/23/2015  . Uncontrolled type 2 diabetes mellitus with hyperglycemia, with long-term current use of insulin ([place]) 08/23/2015  . Essential hypertension 08/23/2015  . Right lower lobe pneumonia ([place]) 08/23/2015  . Abdominal pain 08/21/2015  . Altered mental status 02/19/2015   Leroy Sea, MS/CCC- SLP  Lou Miner 07/07/2016, 9:58 AM  [place] MAIN [facility] SERVICES [address] [place], [place], [address] Phone: [phone]   Fax:  [phone]   Name: Maurice Patterson MRN: [id number] Date of Birth: 01/02/1956

## 2016-07-08 ENCOUNTER — Ambulatory Visit: Payer: PPO | Admitting: Speech Pathology

## 2016-07-14 ENCOUNTER — Encounter: Payer: PPO | Admitting: Speech Pathology

## 2016-07-21 ENCOUNTER — Ambulatory Visit: Payer: PPO | Admitting: Speech Pathology

## 2016-07-21 ENCOUNTER — Encounter: Payer: Self-pay | Admitting: Speech Pathology

## 2016-07-21 DIAGNOSIS — R41841 Cognitive communication deficit: Secondary | ICD-10-CM

## 2016-07-21 DIAGNOSIS — R4701 Aphasia: Secondary | ICD-10-CM

## 2016-07-21 DIAGNOSIS — R471 Dysarthria and anarthria: Secondary | ICD-10-CM

## 2016-07-21 NOTE — Therapy (Signed)
[place] MAIN [facility] SERVICES [address] [place], [place], [address] Phone: [phone]   Fax:  [phone]  Speech Language Pathology Treatment  Patient Details  Name: Maurice Patterson MRN: [id number] Date of Birth: November 28, 1955 Referring Provider: Dr. Melrose Nakayama  Encounter Date: 07/21/2016      End of Session - 07/21/16 1800    Visit Number 22   Number of Visits 25   Date for SLP Re-Evaluation 07/16/16   SLP Start Time 1600   SLP Stop Time  1655   SLP Time Calculation (min) 55 min   Activity Tolerance Patient tolerated treatment well      Past Medical History:  Diagnosis Date  . Acute MI   . Arthritis   . Degenerative lumbar disc   . Diabetes mellitus without complication ([place])   . Hernia of abdominal cavity   . Hypertension   . Malignant intraductal papillary mucinous tumor of pancreas ([place])   . Memory loss   . Seizures ([place])    staring spells  . Stroke ([place])   . TIA (transient ischemic attack)     Past Surgical History:  Procedure Laterality Date  . ERCP N/A 09/12/2015   Procedure: ENDOSCOPIC RETROGRADE CHOLANGIOPANCREATOGRAPHY (ERCP);  Surgeon: Hulen Luster, MD;  Location: [facility] ENDOSCOPY;  Service: Gastroenterology;  Laterality: N/A;  . ERCP N/A 01/02/2016   Procedure: ENDOSCOPIC RETROGRADE CHOLANGIOPANCREATOGRAPHY (ERCP);  Surgeon: Hulen Luster, MD;  Location: [facility] ENDOSCOPY;  Service: Gastroenterology;  Laterality: N/A;  . left arm metal plate    . Left shoulder surgery    . Stent times 2  2013   Cardiac    There were no vitals filed for this visit.      Subjective Assessment - 07/21/16 1759    Subjective The patient reports that he is feeling "better than I was."   Currently in Pain? No/denies               ADULT SLP TREATMENT - 07/21/16 0001      General Information   Behavior/Cognition Alert;Cooperative;Pleasant mood     Treatment Provided   Treatment provided Cognitive-Linquistic     Pain Assessment    Pain Assessment No/denies pain     Cognitive-Linquistic Treatment   Treatment focused on Aphasia;Cognition;Dysarthria   Skilled Treatment The patient completed complex sentences for homework: 80% correct for content, 65% correct for spelling. When provided a list of three words in the same (unstated) category, the patient selected an additional words from a field of three that belonged to the same category: 1/6 correct independently, increased to 5/6 correct with leading question. This activity was challenging for the patient today. Provided a category, the patient circled words belonging to that category from a word bank: 66% correct independently. More errors were regarding words that did not belong to the category (circled words that should not be circled). Auditory closure level 2, predictable: the patient independently provided the correct missing word in 85% of trials. Auditory closure level 2, unpredictable: the patient independently provided the correct missing word in 85% of trials.      Assessment / Recommendations / Plan   Plan Continue with current plan of care     Progression Toward Goals   Progression toward goals Progressing toward goals          SLP Education - 07/21/16 1759    Education provided Yes   Education Details use specific language   Person(s) Educated Patient   Methods Explanation  Comprehension Verbalized understanding            SLP Long Term Goals - 07/02/16 0846      SLP LONG TERM GOAL #1   Title Patient will complete mid and high level word finding tasks with 80% accuracy.   Time 12   Period Weeks   Status Partially Met     SLP LONG TERM GOAL #2   Title Patient will generate grammatical and cogent sentence to complete simple/concrete linguistic task with 80% accuracy.   Time 12   Period Weeks   Status Partially Met     SLP LONG TERM GOAL #3   Title Patient will complete 2 unit processing tasks with 80% accuracy without the need of  repetition of task instructions or significant delays in responding.   Time 12   Period Weeks   Status Partially Met     SLP LONG TERM GOAL #4   Title Patient will demonstrate reading comprehension for sentences and paragraphs with 80% accuracy.    Time 12   Period Weeks   Status Partially Met     SLP LONG TERM GOAL #5   Title Patient will identify communication and cognitive barriers and participate in developing functional compensatory strategies.   Time 12   Period Weeks   Status Partially Met          Plan - 07/21/16 1800    Clinical Impression Statement The patient seemed more challenged by activities today than in previous sessions, which may be due to recent health problems. He demonstrated occasional drooling from either corner of mouth and greater challenge in auditory comprehension, generating descriptions, use of specific language, and working memory.   Speech Therapy Frequency 2x / week   Duration Other (comment) 12 weeks   Treatment/Interventions Language facilitation;Cueing hierarchy;SLP instruction and feedback;Compensatory techniques;Compensatory strategies;Internal/external aids;Multimodal communcation approach;Patient/family education   Potential to Achieve Goals Good   Potential Considerations Severity of impairments;Cooperation/participation level;Previous level of function;Co-morbidities;Ability to learn/carryover information;Family/community support   SLP Home Exercise Plan Circle the words belonging to category   Consulted and Agree with Plan of Care Patient      Patient will benefit from skilled therapeutic intervention in order to improve the following deficits and impairments:   Cognitive communication deficit  Aphasia  Dysarthria and anarthria    Problem List Patient Active Problem List   Diagnosis Date Noted  . Chest pain, rule out acute myocardial infarction 04/05/2016  . Confusion 10/18/2015  . Constipation 08/23/2015  . Uncontrolled type  2 diabetes mellitus with hyperglycemia, with long-term current use of insulin ([place]) 08/23/2015  . Essential hypertension 08/23/2015  . Right lower lobe pneumonia ([place]) 08/23/2015  . Abdominal pain 08/21/2015  . Altered mental status 02/19/2015    Rickard Rhymes  Graduate Student Clinician 07/21/2016, 6:05 PM  [place] MAIN [facility] SERVICES [address] [place], [place], [address] Phone: [phone]   Fax:  [phone]   Name: JERMIAH SODERMAN MRN: [id number] Date of Birth: Apr 23, 1956

## 2016-07-23 ENCOUNTER — Ambulatory Visit: Payer: PPO | Attending: Family Medicine | Admitting: Speech Pathology

## 2016-07-23 ENCOUNTER — Encounter: Payer: Self-pay | Admitting: Speech Pathology

## 2016-07-23 DIAGNOSIS — R41841 Cognitive communication deficit: Secondary | ICD-10-CM | POA: Insufficient documentation

## 2016-07-23 DIAGNOSIS — R471 Dysarthria and anarthria: Secondary | ICD-10-CM | POA: Diagnosis not present

## 2016-07-23 DIAGNOSIS — R4701 Aphasia: Secondary | ICD-10-CM | POA: Diagnosis not present

## 2016-07-23 NOTE — Therapy (Signed)
[place] MAIN [facility] SERVICES [address] [place], [place], [address] Phone: [phone]   Fax:  [phone]  Speech Language Pathology Treatment  Patient Details  Name: Maurice Patterson MRN: [id number] Date of Birth: 10-30-1955 Referring Provider: Dr. Melrose Nakayama  Encounter Date: 07/23/2016      End of Session - 07/23/16 1158    Visit Number 23   Number of Visits 25   Date for SLP Re-Evaluation 07/16/16   SLP Start Time 0845   SLP Stop Time  0945   SLP Time Calculation (min) 60 min      Past Medical History:  Diagnosis Date  . Acute MI   . Arthritis   . Degenerative lumbar disc   . Diabetes mellitus without complication ([place])   . Hernia of abdominal cavity   . Hypertension   . Malignant intraductal papillary mucinous tumor of pancreas ([place])   . Memory loss   . Seizures ([place])    staring spells  . Stroke ([place])   . TIA (transient ischemic attack)     Past Surgical History:  Procedure Laterality Date  . ERCP N/A 09/12/2015   Procedure: ENDOSCOPIC RETROGRADE CHOLANGIOPANCREATOGRAPHY (ERCP);  Surgeon: Hulen Luster, MD;  Location: [facility] ENDOSCOPY;  Service: Gastroenterology;  Laterality: N/A;  . ERCP N/A 01/02/2016   Procedure: ENDOSCOPIC RETROGRADE CHOLANGIOPANCREATOGRAPHY (ERCP);  Surgeon: Hulen Luster, MD;  Location: [facility] ENDOSCOPY;  Service: Gastroenterology;  Laterality: N/A;  . left arm metal plate    . Left shoulder surgery    . Stent times 2  2013   Cardiac    There were no vitals filed for this visit.      Subjective Assessment - 07/23/16 1158    Subjective The patient reports that he is feeling "better than I was."   Currently in Pain? No/denies               ADULT SLP TREATMENT - 07/23/16 0001      General Information   Behavior/Cognition Alert;Cooperative;Pleasant mood     Treatment Provided   Treatment provided Cognitive-Linquistic     Pain Assessment   Pain Assessment No/denies pain     Cognitive-Linquistic Treatment   Treatment focused on Aphasia;Cognition;Dysarthria   Skilled Treatment VERBAL EXPRESSION: Generate cogent/grammatical/intelligible sentence given complex picture stimulus with 70% accuracy.  Errors are unintelligible utterances (imprecise articulation and rapid rate), unrecognized semantic paraphasia, grammatical errors, and word finding errors.  State example of an intangible with 70% accuracy.  Patient demonstrates fewer off task remarks, which had been confusing in combination with task at hand. AUDITORY ATTENTION/MEMORY/COMPREHENSION:  Listen to sentence, answer question using 2 words within the sentence with 80% accuracy.  State word to complete sentence with 80% accuracy.       Assessment / Recommendations / Plan   Plan Continue with current plan of care     Progression Toward Goals   Progression toward goals Progressing toward goals          SLP Education - 07/23/16 1158    Education provided Yes   Education Details Use specific language   Person(s) Educated Patient   Methods Explanation   Comprehension Verbalized understanding            SLP Long Term Goals - 07/02/16 0846      SLP LONG TERM GOAL #1   Title Patient will complete mid and high level word finding tasks with 80% accuracy.   Time 12   Period Weeks  Status Partially Met     SLP LONG TERM GOAL #2   Title Patient will generate grammatical and cogent sentence to complete simple/concrete linguistic task with 80% accuracy.   Time 12   Period Weeks   Status Partially Met     SLP LONG TERM GOAL #3   Title Patient will complete 2 unit processing tasks with 80% accuracy without the need of repetition of task instructions or significant delays in responding.   Time 12   Period Weeks   Status Partially Met     SLP LONG TERM GOAL #4   Title Patient will demonstrate reading comprehension for sentences and paragraphs with 80% accuracy.    Time 12   Period Weeks   Status  Partially Met     SLP LONG TERM GOAL #5   Title Patient will identify communication and cognitive barriers and participate in developing functional compensatory strategies.   Time 12   Period Weeks   Status Partially Met          Plan - 07/23/16 1159    Clinical Impression Statement The patient continues to be less confused and able to complete more cognitive-linguistic tasks in the Tx session.  He demonstrates increased insight into his cognitive function.  Patient is requiring fewer cues to slow speech sufficiently to be understood.  The patient demonstrates improved sustained attention.  Overall, visual perceptual skills, reading comprehension, and auditory memory/comprehension are improved.   Speech Therapy Frequency 2x / week   Duration Other (comment)   Treatment/Interventions Language facilitation;Cueing hierarchy;SLP instruction and feedback;Compensatory techniques;Compensatory strategies;Internal/external aids;Multimodal communcation approach;Patient/family education   Potential to Achieve Goals Good   Potential Considerations Severity of impairments;Cooperation/participation level;Previous level of function;Co-morbidities;Ability to learn/carryover information;Family/community support   SLP Home Exercise Plan Reading comprehension work sheet   Consulted and Agree with Plan of Care Patient      Patient will benefit from skilled therapeutic intervention in order to improve the following deficits and impairments:   Cognitive communication deficit    Problem List Patient Active Problem List   Diagnosis Date Noted  . Chest pain, rule out acute myocardial infarction 04/05/2016  . Confusion 10/18/2015  . Constipation 08/23/2015  . Uncontrolled type 2 diabetes mellitus with hyperglycemia, with long-term current use of insulin ([place]) 08/23/2015  . Essential hypertension 08/23/2015  . Right lower lobe pneumonia ([place]) 08/23/2015  . Abdominal pain 08/21/2015  . Altered mental  status 02/19/2015   Leroy Sea, MS/CCC- SLP  Lou Miner 07/23/2016, 12:00 PM  [place] MAIN [facility] SERVICES [address] [place], [place], [address] Phone: [phone]   Fax:  [phone]   Name: Maurice Patterson MRN: [id number] Date of Birth: [birth year]

## 2016-07-26 ENCOUNTER — Encounter: Payer: Self-pay | Admitting: Speech Pathology

## 2016-07-26 ENCOUNTER — Ambulatory Visit: Payer: PPO | Admitting: Speech Pathology

## 2016-07-26 DIAGNOSIS — R471 Dysarthria and anarthria: Secondary | ICD-10-CM

## 2016-07-26 DIAGNOSIS — R41841 Cognitive communication deficit: Secondary | ICD-10-CM | POA: Diagnosis not present

## 2016-07-26 DIAGNOSIS — R4701 Aphasia: Secondary | ICD-10-CM

## 2016-07-26 NOTE — Therapy (Signed)
[place] MAIN [facility] SERVICES [address] [place], [place], [address] Phone: [phone]   Fax:  [phone]  Speech Language Pathology Treatment  Patient Details  Name: Maurice Patterson MRN: [id number] Date of Birth: 14-May-1956 Referring Provider: Dr. Melrose Nakayama  Encounter Date: 07/26/2016      End of Session - 07/26/16 1532    Visit Number 24   Number of Visits 25   Date for SLP Re-Evaluation 07/16/16   SLP Start Time 0350   SLP Stop Time  1455   SLP Time Calculation (min) 60 min   Activity Tolerance Patient tolerated treatment well      Past Medical History:  Diagnosis Date  . Acute MI   . Arthritis   . Degenerative lumbar disc   . Diabetes mellitus without complication ([place])   . Hernia of abdominal cavity   . Hypertension   . Malignant intraductal papillary mucinous tumor of pancreas ([place])   . Memory loss   . Seizures ([place])    staring spells  . Stroke ([place])   . TIA (transient ischemic attack)     Past Surgical History:  Procedure Laterality Date  . ERCP N/A 09/12/2015   Procedure: ENDOSCOPIC RETROGRADE CHOLANGIOPANCREATOGRAPHY (ERCP);  Surgeon: Hulen Luster, MD;  Location: [facility] ENDOSCOPY;  Service: Gastroenterology;  Laterality: N/A;  . ERCP N/A 01/02/2016   Procedure: ENDOSCOPIC RETROGRADE CHOLANGIOPANCREATOGRAPHY (ERCP);  Surgeon: Hulen Luster, MD;  Location: [facility] ENDOSCOPY;  Service: Gastroenterology;  Laterality: N/A;  . left arm metal plate    . Left shoulder surgery    . Stent times 2  2013   Cardiac    There were no vitals filed for this visit.      Subjective Assessment - 07/26/16 1531    Subjective The patient reports that he is feeling better today.    Currently in Pain? No/denies               ADULT SLP TREATMENT - 07/26/16 0001      General Information   Behavior/Cognition Alert;Cooperative;Pleasant mood     Treatment Provided   Treatment provided Cognitive-Linquistic     Pain Assessment   Pain  Assessment No/denies pain     Cognitive-Linquistic Treatment   Treatment focused on Aphasia;Cognition;Dysarthria   Skilled Treatment RECEPTIVE & EXPRESSIVE LANGUAGE: That patient completed a complex sentence completion task at home. He generated an appropriate word to fill in a blank at the end of a provided sentence for 72.5% of items. He spelled 51% of items correctly for intended target. The patient required removal of the visual stimulus and cues (sentence completion and leading questions) in order to correct content errors. He was able to correct many spelling errors independently upon request - spelling aloud appeared a helpful strategy for simple and rule-following words. The patient was often unsuccessful in spelling words that are not as obviously rule-governed (e.g. "faucet"). The patient completed a generative naming activity ("name 3 items that."). He independently produced appropriate items for 52% of trials; with cues, improved to 79%. Cues included lead-in, semantic, opposite, gesture/mime, sentence completion, and leading question.      Assessment / Recommendations / Plan   Plan Continue with current plan of care     Progression Toward Goals   Progression toward goals Progressing toward goals          SLP Education - 07/26/16 1531    Education provided Yes   Education Details Read response out loud to make sure  it makes sense.    Person(s) Educated Patient   Methods Explanation;Demonstration   Comprehension Verbalized understanding            SLP Long Term Goals - 07/02/16 0846      SLP LONG TERM GOAL #1   Title Patient will complete mid and high level word finding tasks with 80% accuracy.   Time 12   Period Weeks   Status Partially Met     SLP LONG TERM GOAL #2   Title Patient will generate grammatical and cogent sentence to complete simple/concrete linguistic task with 80% accuracy.   Time 12   Period Weeks   Status Partially Met     SLP LONG TERM GOAL #3    Title Patient will complete 2 unit processing tasks with 80% accuracy without the need of repetition of task instructions or significant delays in responding.   Time 12   Period Weeks   Status Partially Met     SLP LONG TERM GOAL #4   Title Patient will demonstrate reading comprehension for sentences and paragraphs with 80% accuracy.    Time 12   Period Weeks   Status Partially Met     SLP LONG TERM GOAL #5   Title Patient will identify communication and cognitive barriers and participate in developing functional compensatory strategies.   Time 12   Period Weeks   Status Partially Met          Plan - 07/26/16 1532    Clinical Impression Statement The patient continues to be less confused and able to complete more cognitive-linguistic tasks in the Tx session.  He demonstrates increased insight into his cognitive function.  Patient is requiring fewer cues to slow speech sufficiently to be understood.  The patient demonstrates improved sustained attention.  Overall, visual perceptual skills, reading comprehension, and auditory memory/comprehension are improved. He still demonstrates a tendency to perseverate/become distracted by visual information and requires assistance in removing the distracting stimulus.    Speech Therapy Frequency 2x / week   Duration Other (comment) 12 weeks   Treatment/Interventions Language facilitation;Cueing hierarchy;SLP instruction and feedback;Compensatory techniques;Compensatory strategies;Internal/external aids;Multimodal communcation approach;Patient/family education   Potential to Achieve Goals Good   Potential Considerations Severity of impairments;Cooperation/participation level;Previous level of function;Co-morbidities;Ability to learn/carryover information;Family/community support   SLP Home Exercise Plan Choose the clearer of the two sentences.    Consulted and Agree with Plan of Care Patient      Patient will benefit from skilled therapeutic  intervention in order to improve the following deficits and impairments:   Cognitive communication deficit  Aphasia  Dysarthria and anarthria    Problem List Patient Active Problem List   Diagnosis Date Noted  . Chest pain, rule out acute myocardial infarction 04/05/2016  . Confusion 10/18/2015  . Constipation 08/23/2015  . Uncontrolled type 2 diabetes mellitus with hyperglycemia, with long-term current use of insulin ([place]) 08/23/2015  . Essential hypertension 08/23/2015  . Right lower lobe pneumonia ([place]) 08/23/2015  . Abdominal pain 08/21/2015  . Altered mental status 02/19/2015    Rickard Rhymes  Graduate Student Clinician 07/26/2016, 3:37 PM  [place] MAIN [facility] SERVICES [address] [place], [place], [address] Phone: [phone]   Fax:  [phone]   Name: Maurice Patterson MRN: [id number] Date of Birth: 1956/08/02

## 2016-08-04 ENCOUNTER — Ambulatory Visit: Payer: PPO | Admitting: Speech Pathology

## 2016-09-23 DIAGNOSIS — R413 Other amnesia: Secondary | ICD-10-CM | POA: Diagnosis not present

## 2016-09-23 DIAGNOSIS — R410 Disorientation, unspecified: Secondary | ICD-10-CM | POA: Diagnosis not present

## 2016-09-23 DIAGNOSIS — Z8673 Personal history of transient ischemic attack (TIA), and cerebral infarction without residual deficits: Secondary | ICD-10-CM | POA: Diagnosis not present

## 2016-09-23 DIAGNOSIS — Z8669 Personal history of other diseases of the nervous system and sense organs: Secondary | ICD-10-CM | POA: Diagnosis not present

## 2016-09-23 DIAGNOSIS — R404 Transient alteration of awareness: Secondary | ICD-10-CM | POA: Diagnosis not present

## 2016-09-28 DIAGNOSIS — I2510 Atherosclerotic heart disease of native coronary artery without angina pectoris: Secondary | ICD-10-CM | POA: Diagnosis not present

## 2016-09-28 DIAGNOSIS — Z794 Long term (current) use of insulin: Secondary | ICD-10-CM | POA: Diagnosis not present

## 2016-09-28 DIAGNOSIS — E118 Type 2 diabetes mellitus with unspecified complications: Secondary | ICD-10-CM | POA: Diagnosis not present

## 2016-09-28 DIAGNOSIS — I10 Essential (primary) hypertension: Secondary | ICD-10-CM | POA: Diagnosis not present

## 2016-09-28 DIAGNOSIS — R4182 Altered mental status, unspecified: Secondary | ICD-10-CM | POA: Diagnosis not present

## 2016-09-28 DIAGNOSIS — E1165 Type 2 diabetes mellitus with hyperglycemia: Secondary | ICD-10-CM | POA: Diagnosis not present

## 2016-10-07 ENCOUNTER — Other Ambulatory Visit: Payer: Self-pay | Admitting: Neurology

## 2016-10-07 DIAGNOSIS — E782 Mixed hyperlipidemia: Secondary | ICD-10-CM | POA: Diagnosis not present

## 2016-10-07 DIAGNOSIS — I10 Essential (primary) hypertension: Secondary | ICD-10-CM | POA: Diagnosis not present

## 2016-10-07 DIAGNOSIS — R410 Disorientation, unspecified: Secondary | ICD-10-CM

## 2016-10-07 DIAGNOSIS — I2510 Atherosclerotic heart disease of native coronary artery without angina pectoris: Secondary | ICD-10-CM | POA: Diagnosis not present

## 2016-10-07 DIAGNOSIS — Z9889 Other specified postprocedural states: Secondary | ICD-10-CM | POA: Diagnosis not present

## 2016-10-07 DIAGNOSIS — R0602 Shortness of breath: Secondary | ICD-10-CM | POA: Diagnosis not present

## 2016-10-07 DIAGNOSIS — D490 Neoplasm of unspecified behavior of digestive system: Secondary | ICD-10-CM | POA: Diagnosis not present

## 2016-10-07 DIAGNOSIS — R0789 Other chest pain: Secondary | ICD-10-CM | POA: Diagnosis not present

## 2016-10-07 DIAGNOSIS — Z8673 Personal history of transient ischemic attack (TIA), and cerebral infarction without residual deficits: Secondary | ICD-10-CM | POA: Diagnosis not present

## 2016-10-07 DIAGNOSIS — E1165 Type 2 diabetes mellitus with hyperglycemia: Secondary | ICD-10-CM | POA: Diagnosis not present

## 2016-10-07 DIAGNOSIS — R079 Chest pain, unspecified: Secondary | ICD-10-CM | POA: Diagnosis not present

## 2016-10-07 DIAGNOSIS — Z794 Long term (current) use of insulin: Secondary | ICD-10-CM | POA: Diagnosis not present

## 2016-10-08 ENCOUNTER — Ambulatory Visit
Admission: RE | Admit: 2016-10-08 | Discharge: 2016-10-08 | Disposition: A | Discharge status: Home / Self Care | Payer: PPO | Source: Ambulatory Visit | Attending: Neurology | Admitting: Neurology

## 2016-10-08 ENCOUNTER — Other Ambulatory Visit: Payer: Self-pay | Admitting: Neurology

## 2016-10-08 DIAGNOSIS — R410 Disorientation, unspecified: Secondary | ICD-10-CM | POA: Insufficient documentation

## 2016-10-08 DIAGNOSIS — R930 Abnormal findings on diagnostic imaging of skull and head, not elsewhere classified: Secondary | ICD-10-CM | POA: Diagnosis not present

## 2016-10-08 DIAGNOSIS — G319 Degenerative disease of nervous system, unspecified: Secondary | ICD-10-CM | POA: Insufficient documentation

## 2016-10-19 DIAGNOSIS — Z8673 Personal history of transient ischemic attack (TIA), and cerebral infarction without residual deficits: Secondary | ICD-10-CM | POA: Diagnosis not present

## 2016-10-19 DIAGNOSIS — R404 Transient alteration of awareness: Secondary | ICD-10-CM | POA: Diagnosis not present

## 2016-10-19 DIAGNOSIS — G479 Sleep disorder, unspecified: Secondary | ICD-10-CM | POA: Diagnosis not present

## 2016-10-19 DIAGNOSIS — Z8669 Personal history of other diseases of the nervous system and sense organs: Secondary | ICD-10-CM | POA: Diagnosis not present

## 2016-10-20 ENCOUNTER — Ambulatory Visit (INDEPENDENT_AMBULATORY_CARE_PROVIDER_SITE_OTHER): Payer: PPO | Admitting: Neurology

## 2016-10-20 ENCOUNTER — Encounter: Payer: Self-pay | Admitting: Neurology

## 2016-10-20 VITALS — BP 160/83 | HR 100 | Ht 67.0 in | Wt 189.2 lb

## 2016-10-20 DIAGNOSIS — Z7409 Other reduced mobility: Secondary | ICD-10-CM

## 2016-10-20 DIAGNOSIS — R4189 Other symptoms and signs involving cognitive functions and awareness: Secondary | ICD-10-CM | POA: Diagnosis not present

## 2016-10-20 DIAGNOSIS — Z789 Other specified health status: Secondary | ICD-10-CM | POA: Diagnosis not present

## 2016-10-20 DIAGNOSIS — R269 Unspecified abnormalities of gait and mobility: Secondary | ICD-10-CM | POA: Diagnosis not present

## 2016-10-20 DIAGNOSIS — F05 Delirium due to known physiological condition: Secondary | ICD-10-CM | POA: Diagnosis not present

## 2016-10-20 DIAGNOSIS — E538 Deficiency of other specified B group vitamins: Secondary | ICD-10-CM | POA: Diagnosis not present

## 2016-10-20 DIAGNOSIS — R413 Other amnesia: Secondary | ICD-10-CM | POA: Diagnosis not present

## 2016-10-20 DIAGNOSIS — R410 Disorientation, unspecified: Secondary | ICD-10-CM

## 2016-10-20 DIAGNOSIS — F0391 Unspecified dementia with behavioral disturbance: Secondary | ICD-10-CM | POA: Diagnosis not present

## 2016-10-20 NOTE — Progress Notes (Addendum)
[facility]    Provider:  Dr Maurice Patterson Referring Provider: [name], MD Primary Care Physician:  Maurice Curry, MD  CC:  Rapidly declining cognitive impairment  HPI:  Maurice Patterson is a 61 y.o. male here as a referral from Dr. Astrid Patterson for cognitive impairment. PMHx obesity, cognitive decline(extensive workup already by [facility] and seeing them for years without a diagnosis of neurodegenerative disease likely delirium and placed on Aricept 5mg ), seizures on Lamictal,TIA/Stroke on ASA and Plavix, Pancreatitis s/p stent, HTN, HLD, Diabetes, CHF, CAD, back pain, acute MI.   He has already had extensive workup at [facility] including LP and csf paraneoplastic panel. [facility] diagnosed as Delirium, multifactorial due to history of seizures, neuroleptic sensitivity and stroke. Previous history of hospital admission for delirium worse with Risperdal, he was started on Aricept 5 mg a day recently by [facility] and urgently referred here by [facility](unclear why [place] [facility] would refer here urgently).   They don't want to go back to [facility]. They were first seen at [facility] neurology for Delirium in the hospital and continued to be followed for cognitive changes that started during that admission in the setting of Ambien use, his wife provides most information. She has an Forensic psychologist. In July of last year he improved to "complete normal". He was doing very well. He has been fine since July and going to speech therapy. 3 weeks ago he declined after Tamiflu and since then he has been "crazy, completely lost, begging to go home" he has seen Dr. [name] since then and diagnosed with Delirium. They tried to have an MRI brain and could not tolerate. CT of the head was unremarkable. This past Sunday he couldn't talk, couldn't eat. Yesterday he could talk and walk. He has improved tremendously since Sunday but still has waxing and waning.  No history of dementia in the family.   Reviewed notes, labs and  imaging from outside physicians, which showed:   CT head: personally reviewed imaging and agree with the following:   FINDINGS: Brain: Mild atrophy with progression from the prior study. Mild chronic microvascular ischemic change.  Negative for acute infarct. Negative for hemorrhage or mass. No shift of the midline structures.  Vascular: No hyperdense vessel.  Diffuse arterial calcification.  Skull: Negative  Sinuses/Orbits: Radiopaque density in the right eye lid likely foreign body or calcification. This is similar to the prior CT. No orbital mass.  Paranasal sinuses clear.  Mastoid sinus clear.  Other: None  IMPRESSION: Progressive atrophy.  Reviewed primary care notes. Symptoms started in November 2016 when patient was first given Ambien Has been he really started having pro A neurologist from [address] When he is under stress it gets w The symptoms are memory loss and  Patient does not drive. His memory loss is more short-term than long-term.He had an MRI done at [facility] in Dec and January 2016 were both normal.Patient has significant medical history including  Heart attack, stroke. Also saw neurologist at [facility] who stated he was delirious. MRI normal.He was taken off of B12 due to pancreatitis.  He has a sleep disorder and has insomnia. He takes Lamictal for  Seizures. EEG June 2016 was a 5 hour 26 minute EEG was abnormal due to presence of left temporal epileptiform discharges.it appears his mental status and speech  Have improved, he has an unsteady gait improved wi with physical therapy, he was seen by speech and able to communicatewith minimal difficulties, he still has periods of confusion and  disorientation b but he is easily reoriented.   Per [facility] neurology: He has been seeing [facility] [facility]. Symptoms most consistent with Delirium; Likely multifactorial in a patient with history of seizures, neuroleptic sensitivity, and stroke. But he was started on Aricept  5mg  and urgently referred to [place] from [place] (for unknown reasons since he was seeing [place] neurology). He has had admissions for Delirium worse with Risperdal. They recommended continued Lamictal for seizures.  He is on Plavix and ASA or stroke prevention. He takes melatonin for sleep.   [facility] notes: Transient memory loss with expressive aphasia- Likely multifactorial in a patient with history of seizures, neuroleptic sensitivity, and stroke -We discussed CTE -Previous history of hospital admission for delirium worse with Risperdal  -We will obtain MRI Brain with and without contrast -We will make urgent referral to [facility] -We will start Aricept 5 mg once a day. Donepezil is a cholinesterase inhibitor; it works by preventing acetylcholine breakdown in the brain. Most common side effects are nausea, vomiting and diarrhea. Typically starting dose is 5 mg once a day and can increase up to 10 mg once a day, if tolerated. -Recommend continuing working with Physical Therapy for balance and strengthening exercises.  History of stroke -Rec continuing taking Aspirin 81 mg once a day and Plavix 75 mg once a day as secondary stroke prevention -Rec continuing with Atorvastatin 80 mg once a day -Patient should recognize the signs and symptoms of stroke which are subtle and dramatic. Patient advised to call 911 if having any one or more of these signs and symptoms: sudden numbness or weakness of the face or extremities, sudden confusion, trouble speaking, or understanding speech; sudden trouble seeing in one or both eyes; sudden trouble walking; loss of balance or coordination' dizziness; and sudden severe headache with no known cause.  Seizures -Controlled. No new seizures -Wife states patient hasn't had any new seizures. Taking Lamictal 100 mg twice a day and tolerating without side effects.  Sleep Disorder -Improved -Patient is sleeping about 6 hours - 8 hours per night, per wife. Taking  melatonin for sleep -Will continue to monitor.  Prolonged EEG with video 10/2015 [facility]: reviewed report as follows:  Report: This EEG was acquired with electrodes placed according to  the International 10-20 electrode placement system.. Video was  recorded simultaneously. The entire study was reviewed; video was  reviewed for selected events.  The recording quality was good. There was no clear evidence for  an occipital dominant rhythm. The background consisted  predominantly of theta (6 - 7 Hz) with intermixed (delta (1.5 -  2.5 Hz)activity. Drowsiness and sleep were manifested by  background fragmentation, vertex waves, K-complexes, and sleep  spindles. There was no focal slowing. Interictal discharges  consisting of occassional spike and sharp waves were seen in the  left temporal head region.There were no electrographic seizures  identified.Photic stimulation was not performed.  Hyperventilation was not performed.  Impression: This EEG obtained in the awake and asleep states is  abnormal due to:  1. Occasional spike and sharp waves, left temporal.  2. Mild background slowing.   Clinical Correlation: The above findings are consistent with:  1. An area of neuronal irritability within the left temporal head  region.  2. Mild encephalopathy.   CT head 09/2015: reviewed report:  FINDINGS:  No hemorrhage, extra-axial fluid collection, cerebral edema, acute cortical infarction, mass, mass effect, midline shift. Ventricles and sulci are normal for patient age.  Normal paranasal sinuses, orbits and cranium.  IMPRESSION: No acute intracranial abnormalities.    The preliminary report (critical or emergent communication) was reviewed prior to this dictation and there are no substantial differences between the preliminary results and the impressions in this final report.  HgbA1c 7 09/2016 (was 10 in 02/2016)  CMP 09/23/2016 showed BUN 9, creatinine 0.8.   Ammonia  41 09/23/2016  HIV NR  Tox urine negative 09/2015  TSH nml 09/2015  Unremarkable csf 10/2015. Paraneoplastic antibodies 10/2015 negative csf Review of Systems: Patient complains of symptoms per HPI as well as the following symptoms: fatigue, SOB, cough, wheezing, murmur, swellign in legs, insomnia, sleepiness, memory loss, confusion, headache, weakness, slurred speech. Pertinent negatives per HPI. All others negative.   Social History   Social History  . Marital status: Married    Spouse name: N/A  . Number of children: 2  . Years of education: BS   Occupational History  . Disabled    Social History Main Topics  . Smoking status: Former Smoker    Quit date: 2013  . Smokeless tobacco: Never Used     Comment: used to smoke 2PD for 40 yrs, quit about 4 years ago  . Alcohol use No     Comment: occasional  . Drug use: No  . Sexual activity: Not on file   Other Topics Concern  . Not on file   Social History Narrative   Lives at home with wife and son. Ambulatory at baseline.   Right-handed   Caffeine: 2 sodas per day    Family History  Problem Relation Age of Onset  . Diabetes Mother   . Hypertension Mother   . CAD Mother   . Hyperlipidemia Mother   . Stroke Mother   . ALS Mother   . Alzheimer's disease Father   . Diabetes Father   . Heart Problems Brother     Past Medical History:  Diagnosis Date  . Acute MI   . Arthritis   . Back pain   . CAD (coronary artery disease)   . CHF (congestive heart failure) ([place])   . Degenerative lumbar disc   . Diabetes mellitus without complication ([place])   . GERD (gastroesophageal reflux disease)   . Hernia of abdominal cavity   . Hyperlipemia   . Hypertension   . Malignant intraductal papillary mucinous tumor of pancreas ([place])   . Memory loss   . Pancreatitis   . Seizures ([place])    staring spells  . Shingles   . Stroke ([place])   . TIA (transient ischemic attack)     Past Surgical History:  Procedure Laterality Date  .  CARDIAC CATHETERIZATION    . CORONARY ANGIOPLASTY    . ERCP N/A 09/12/2015   Procedure: ENDOSCOPIC RETROGRADE CHOLANGIOPANCREATOGRAPHY (ERCP);  Surgeon: Hulen Luster, MD;  Location: [facility] ENDOSCOPY;  Service: Gastroenterology;  Laterality: N/A;  . ERCP N/A 01/02/2016   Procedure: ENDOSCOPIC RETROGRADE CHOLANGIOPANCREATOGRAPHY (ERCP);  Surgeon: Hulen Luster, MD;  Location: [facility] ENDOSCOPY;  Service: Gastroenterology;  Laterality: N/A;  . EXTERNAL FIXATION WRIST FRACTURE Left   . left arm metal plate    . Left shoulder surgery    . Stent times 2  2013   Cardiac  . TOTAL ELBOW REPLACEMENT Left   . VASECTOMY    . VENTRICULOPERITONEAL SHUNT      Current Outpatient Prescriptions  Medication Sig Dispense Refill  . Acetaminophen 500 MG coapsule Take 500 mg by mouth 2 (two) times daily.    Marland Kitchen aspirin 81 MG  tablet Take 81 mg by mouth daily.    Marland Kitchen atorvastatin (LIPITOR) 80 MG tablet Take 1 tablet by mouth daily.  0  . CHOLESTYRAMINE PO Take 4 g by mouth 6 (six) times daily.    . clopidogrel (PLAVIX) 75 MG tablet Take 1 tablet by mouth daily.    Marland Kitchen donepezil (ARICEPT) 5 MG tablet Take 5 mg by mouth at bedtime.     . furosemide (LASIX) 40 MG tablet Take 1 tablet by mouth daily.    . insulin NPH Human (HUMULIN N,NOVOLIN N) 100 UNIT/ML injection Inject 0.25 mLs (25 Units total) into the skin 2 (two) times daily. (Patient taking differently: Inject 30 Units into the skin 2 (two) times daily. ) 10 mL 11  . insulin regular (NOVOLIN R,HUMULIN R) 100 units/mL injection Inject 0.3 mLs (30 Units total) into the skin 3 (three) times daily with meals. (Patient taking differently: Inject 34 Units into the skin 3 (three) times daily before meals. ) 10 mL 1  . isosorbide mononitrate (IMDUR) 30 MG 24 hr tablet     . lamoTRIgine (LAMICTAL) 100 MG tablet Take 100 mg by mouth 2 (two) times daily.    Marland Kitchen lidocaine (LIDODERM) 5 % Place 1 patch onto the skin daily. Remove & Discard patch within 12 hours or as directed by MD    .  losartan (COZAAR) 100 MG tablet Take 1 tablet by mouth daily.    . Melatonin 3 MG TABS Take 3 mg by mouth.    . metoprolol succinate (TOPROL-XL) 50 MG 24 hr tablet Take 1 tablet by mouth daily.    . nitroGLYCERIN (NITROSTAT) 0.4 MG SL tablet Place 1 tablet under the tongue every 5 (five) minutes as needed for chest pain.     . Pancrelipase, Lip-Prot-Amyl, 25000 units CPEP Take by mouth.    . pantoprazole (PROTONIX) 40 MG tablet     . potassium chloride (K-DUR) 10 MEQ tablet Take 10 mEq by mouth 2 (two) times daily.    Marland Kitchen gabapentin (NEURONTIN) 100 MG capsule      No current facility-administered medications for this visit.     Allergies as of 10/20/2016 - Review Complete 10/20/2016  Allergen Reaction Noted  . Hydrocodone Anaphylaxis 02/19/2015  . Morphine Other (See Comments) 05/27/2015  . Ambien [zolpidem] Other (See Comments) 02/19/2015  . Benadryl [diphenhydramine hcl (sleep)]  04/04/2016  . Brilinta [ticagrelor] Other (See Comments) 02/19/2015  . Flexeril [cyclobenzaprine] Other (See Comments) 12/19/2015  . Haldol [haloperidol lactate] Other (See Comments) 12/19/2015  . Levetiracetam Diarrhea and Other (See Comments) 05/27/2015  . Lorazepam Hives 05/27/2015  . Risperdal [risperidone] Other (See Comments) 12/19/2015  . Trazodone Other (See Comments) 05/27/2015  . Penicillins Rash 02/19/2015    Vitals: BP (!) 160/83   Pulse 100   Ht 5\' 7"  (1.702 m)   Wt 189 lb 3.2 oz (85.8 kg)   BMI 29.63 kg/m  Last Weight:  Wt Readings from Last 1 Encounters:  10/20/16 189 lb 3.2 oz (85.8 kg)   Last Height:   Ht Readings from Last 1 Encounters:  10/20/16 5\' 7"  (1.702 m)   Physical exam: Exam: Gen: NAD, not conversant                   CV: RRR, no MRG. No Carotid Bruits. No peripheral edema, warm, nontender Eyes: Conjunctivae clear without exudates or hemorrhage  Neuro: Detailed Neurologic Exam  Speech:    Speech is normal;  Cognition:  MMSE - Mini Mental State  Exam 10/20/2016    Orientation to time 2  Orientation to Place 2  Registration 3  Attention/ Calculation 1  Recall 2  Language- name 2 objects 2  Language- repeat 1  Language- follow 3 step command 2  Language- read & follow direction 1  Write a sentence 1  Copy design 0  Total score 17      The patient is oriented to person    recent and remote memory impaired;     language without aphasia;     Impaired attention, concentration, fund of knowledge Cranial Nerves:    The pupils are equal, round, and reactive to light. Attempted fundoscopic exam could not visualize. Visual fields are full to finger confrontation. Impaired upgaze otherwise extraocular movements are intact. Trigeminal sensation is intact and the muscles of mastication are normal. The face is symmetric. The palate elevates in the midline. Hearing intact. Voice is normal. Shoulder shrug is normal. The tongue has normal motion without fasciculations.   Coordination:    No dysmetria  Gait:    Wide-based and shuffling  Motor Observation:    No asymmetry, no atrophy, and no involuntary movements noted. Tone:    Normal muscle tone.    Posture:    Posture is normal. normal erect    Strength:    Strength is V/V in the upper and lower limbs.      Sensation: intact to LT     Reflex Exam:  DTR's:    Absent AJs otherwise deep tendon reflexes in the upper and lower extremities are normal bilaterally.   Toes:    The toes are downgoing bilaterally.   Clonus:    Clonus is absent.       Assessment/Plan:   Maurice Patterson is a 61 y.o. male here as an urgent referral from [place] (who has been seeing him for cognitive changes for years with an extensive workup) for cognitive impairment. PMHx cognitive decline(extensive workup already by [facility] and seeing them for years without a diagnosis of neurodegenerative disease likely delirium and placed on Aricept 5mg ), seizures on Lamictal,TIA/Stroke on ASA and Plavix, Pancreatitis s/p stent, HTN,  HLD, Diabetes, CHF, CAD, back pain, acute MI.   He has already had extensive workup at [facility] including LP and csf paraneoplastic panel. [facility] diagnosed as Delirium, multifactorial due to history of seizures, neuroleptic sensitivity and stroke. Previous history of hospital admission for delirium worse with Risperdal, he was started on Aricept 5 mg a day recently by [facility] and urgently referred here by [facility](unclear why [place] [facility] would refer here urgently after seeing him for this condition for years).  - I reviewed his extensive history of labs, brain imaging, prolonged EEGs. I am not sure I have much more testing to offer patient. We could provide an FDG PET scan and formal neurocognitive testing. - CT of the head does show progressive atrophy. - He had difficulty following commands today, alert but reduced comprehension, MMSE 17/30 - Can try to get another MRI, he did not tolerate it can try wth open MRI. Rapidly progressing dementia in a fairly young patient 63, need to eval for reversible causes, encephalitis - FDG PET Scan to eval for alzheimers or frontotemporal dementia - Neurocognitive testing after workup above - PT and OT to the home can't leave unattended.due o significant memory issues - Labs today  Orders Placed This Encounter  Procedures  . MR BRAIN W WO CONTRAST  . NM PET Metabolic Brain  . Thyroid Panel With  TSH  . B12 and Folate Panel  . Methylmalonic acid, serum  . Vitamin D, 25-hydroxy  . Homocysteine  . Comprehensive metabolic panel  . CBC with Differential/Platelet  . RPR  . Ambulatory referral to [place], [facility] [address] [place], [place] [address]  Phone [phone] Fax [phone]

## 2016-10-20 NOTE — Patient Instructions (Signed)
Remember to drink plenty of fluid, eat healthy meals and do not skip any meals. Try to eat protein with a every meal and eat a healthy snack such as fruit or nuts in between meals. Try to keep a regular sleep-wake schedule and try to exercise daily, particularly in the form of walking, 20-30 minutes a day, if you can.   As far as your medications are concerned, I would like to suggest  As far as diagnostic testing:  - Can try to get another MRI  - FDG PET Scan - Neurocognitive testing after workup above - PT and OT to the home can't leave unattended. - Labs  I would like to see you back in 8 weeks, sooner if we need to. Please call us with any interim questions, concerns, problems, updates or refill requests.    Our phone number is [phone]. We also have an after hours call service for urgent matters and there is a physician on-call for urgent questions. For any emergencies you know to call 911 or go to the nearest emergency room

## 2016-10-20 NOTE — Addendum Note (Signed)
Addended by: Sarina Ill B on: 10/20/2016 09:22 AM   Modules accepted: Orders

## 2016-10-20 NOTE — Addendum Note (Signed)
Addended by: Sarina Ill B on: 10/20/2016 07:45 PM   Modules accepted: Orders

## 2016-10-21 ENCOUNTER — Telehealth: Payer: Self-pay

## 2016-10-21 DIAGNOSIS — I2510 Atherosclerotic heart disease of native coronary artery without angina pectoris: Secondary | ICD-10-CM | POA: Diagnosis not present

## 2016-10-21 DIAGNOSIS — R079 Chest pain, unspecified: Secondary | ICD-10-CM | POA: Diagnosis not present

## 2016-10-21 NOTE — Telephone Encounter (Signed)
-----   Message from Melvenia Beam, MD sent at 10/21/2016  9:24 AM EST ----- Vitamin D is extremely low. I recommend daily Vitamin D OTC 2000 IU daily. Thanks

## 2016-10-21 NOTE — Telephone Encounter (Signed)
I have not got his MRI approved by his insurance [employer] and the way they are it can take up to 14 days to get it approved... [facility] has open machine's and so does [facility].

## 2016-10-21 NOTE — Telephone Encounter (Signed)
Returned call to pt's wife and let her know that both [facility] and [facility] have open MRIs.

## 2016-10-21 NOTE — Telephone Encounter (Signed)
Called and spoke w/ pt's wife. Reviewed labs and recommendation for vit D supplement per Dr. Jaynee Eagles. Verbalized understanding and appreciation for call.   Says that open MRI was approved yesterday through [facility].

## 2016-10-24 LAB — CBC WITH DIFFERENTIAL/PLATELET
BASOS: 0 %
Basophils Absolute: 0 10*3/uL (ref 0.0–0.2)
EOS (ABSOLUTE): 0 10*3/uL (ref 0.0–0.4)
Eos: 0 %
HEMATOCRIT: 42.9 % (ref 37.5–51.0)
HEMOGLOBIN: 14.4 g/dL (ref 13.0–17.7)
Immature Grans (Abs): 0 10*3/uL (ref 0.0–0.1)
Immature Granulocytes: 0 %
LYMPHS ABS: 2.4 10*3/uL (ref 0.7–3.1)
Lymphs: 30 %
MCH: 31 pg (ref 26.6–33.0)
MCHC: 33.6 g/dL (ref 31.5–35.7)
MCV: 93 fL (ref 79–97)
MONOCYTES: 9 %
Monocytes Absolute: 0.7 10*3/uL (ref 0.1–0.9)
NEUTROS ABS: 4.8 10*3/uL (ref 1.4–7.0)
Neutrophils: 61 %
Platelets: 221 10*3/uL (ref 150–379)
RBC: 4.64 x10E6/uL (ref 4.14–5.80)
RDW: 14.1 % (ref 12.3–15.4)
WBC: 8 10*3/uL (ref 3.4–10.8)

## 2016-10-24 LAB — COMPREHENSIVE METABOLIC PANEL
ALBUMIN: 4.5 g/dL (ref 3.5–5.5)
ALT: 19 IU/L (ref 0–44)
AST: 22 IU/L (ref 0–40)
Albumin/Globulin Ratio: 2 (ref 1.2–2.2)
Alkaline Phosphatase: 191 IU/L — ABNORMAL HIGH (ref 39–117)
BUN / CREAT RATIO: 9 (ref 9–20)
BUN: 7 mg/dL (ref 6–24)
Bilirubin Total: 0.4 mg/dL (ref 0.0–1.2)
CO2: 25 mmol/L (ref 18–29)
CREATININE: 0.79 mg/dL (ref 0.76–1.27)
Calcium: 9.2 mg/dL (ref 8.7–10.2)
Chloride: 105 mmol/L (ref 96–106)
GFR calc non Af Amer: 98 mL/min/{1.73_m2} (ref >59)
GFR, EST AFRICAN AMERICAN: 114 mL/min/{1.73_m2} (ref >59)
GLOBULIN, TOTAL: 2.3 g/dL (ref 1.5–4.5)
GLUCOSE: 70 mg/dL (ref 65–99)
Potassium: 3.5 mmol/L (ref 3.5–5.2)
Sodium: 146 mmol/L — ABNORMAL HIGH (ref 134–144)
Total Protein: 6.8 g/dL (ref 6.0–8.5)

## 2016-10-24 LAB — THYROID PANEL WITH TSH
FREE THYROXINE INDEX: 1.7 (ref 1.2–4.9)
T3 UPTAKE RATIO: 25 % (ref 24–39)
T4, Total: 6.7 ug/dL (ref 4.5–12.0)
TSH: 1.15 u[IU]/mL (ref 0.450–4.500)

## 2016-10-24 LAB — HOMOCYSTEINE: Homocysteine: 10 umol/L (ref 0.0–15.0)

## 2016-10-24 LAB — METHYLMALONIC ACID, SERUM: Methylmalonic Acid: 282 nmol/L (ref 0–378)

## 2016-10-24 LAB — VITAMIN D 25 HYDROXY (VIT D DEFICIENCY, FRACTURES): Vit D, 25-Hydroxy: 9.2 ng/mL — ABNORMAL LOW (ref 30.0–100.0)

## 2016-10-24 LAB — RPR: RPR Ser Ql: NONREACTIVE

## 2016-10-24 LAB — B12 AND FOLATE PANEL
FOLATE: 18.8 ng/mL (ref >3.0)
Vitamin B-12: 530 pg/mL (ref 232–1245)

## 2016-10-27 DIAGNOSIS — R000 Tachycardia, unspecified: Secondary | ICD-10-CM | POA: Diagnosis not present

## 2016-10-27 DIAGNOSIS — R079 Chest pain, unspecified: Secondary | ICD-10-CM | POA: Diagnosis not present

## 2016-10-27 DIAGNOSIS — R0602 Shortness of breath: Secondary | ICD-10-CM | POA: Diagnosis not present

## 2016-10-27 DIAGNOSIS — I214 Non-ST elevation (NSTEMI) myocardial infarction: Secondary | ICD-10-CM | POA: Diagnosis not present

## 2016-10-27 DIAGNOSIS — I10 Essential (primary) hypertension: Secondary | ICD-10-CM | POA: Diagnosis not present

## 2016-10-27 DIAGNOSIS — I2510 Atherosclerotic heart disease of native coronary artery without angina pectoris: Secondary | ICD-10-CM | POA: Diagnosis not present

## 2016-10-27 DIAGNOSIS — E782 Mixed hyperlipidemia: Secondary | ICD-10-CM | POA: Diagnosis not present

## 2016-10-27 DIAGNOSIS — Z8673 Personal history of transient ischemic attack (TIA), and cerebral infarction without residual deficits: Secondary | ICD-10-CM | POA: Diagnosis not present

## 2016-11-04 ENCOUNTER — Encounter: Payer: Self-pay | Admitting: *Deleted

## 2016-11-04 ENCOUNTER — Encounter
Admission: RE | Disposition: A | Discharge status: Home / Self Care | Payer: Self-pay | Source: Ambulatory Visit | Attending: Cardiology

## 2016-11-04 ENCOUNTER — Ambulatory Visit
Admission: RE | Admit: 2016-11-04 | Discharge: 2016-11-04 | Disposition: A | Discharge status: Home / Self Care | Payer: PPO | Source: Ambulatory Visit | Attending: Cardiology | Admitting: Cardiology

## 2016-11-04 DIAGNOSIS — Z7902 Long term (current) use of antithrombotics/antiplatelets: Secondary | ICD-10-CM | POA: Diagnosis not present

## 2016-11-04 DIAGNOSIS — G40909 Epilepsy, unspecified, not intractable, without status epilepticus: Secondary | ICD-10-CM | POA: Insufficient documentation

## 2016-11-04 DIAGNOSIS — Z79899 Other long term (current) drug therapy: Secondary | ICD-10-CM | POA: Insufficient documentation

## 2016-11-04 DIAGNOSIS — R0602 Shortness of breath: Secondary | ICD-10-CM | POA: Insufficient documentation

## 2016-11-04 DIAGNOSIS — I2510 Atherosclerotic heart disease of native coronary artery without angina pectoris: Secondary | ICD-10-CM | POA: Insufficient documentation

## 2016-11-04 DIAGNOSIS — F0390 Unspecified dementia without behavioral disturbance: Secondary | ICD-10-CM | POA: Diagnosis not present

## 2016-11-04 DIAGNOSIS — I110 Hypertensive heart disease with heart failure: Secondary | ICD-10-CM | POA: Insufficient documentation

## 2016-11-04 DIAGNOSIS — Z955 Presence of coronary angioplasty implant and graft: Secondary | ICD-10-CM | POA: Diagnosis not present

## 2016-11-04 DIAGNOSIS — Z8673 Personal history of transient ischemic attack (TIA), and cerebral infarction without residual deficits: Secondary | ICD-10-CM | POA: Diagnosis not present

## 2016-11-04 DIAGNOSIS — I252 Old myocardial infarction: Secondary | ICD-10-CM | POA: Diagnosis not present

## 2016-11-04 DIAGNOSIS — I25119 Atherosclerotic heart disease of native coronary artery with unspecified angina pectoris: Secondary | ICD-10-CM | POA: Diagnosis not present

## 2016-11-04 DIAGNOSIS — I509 Heart failure, unspecified: Secondary | ICD-10-CM | POA: Insufficient documentation

## 2016-11-04 DIAGNOSIS — Z7982 Long term (current) use of aspirin: Secondary | ICD-10-CM | POA: Insufficient documentation

## 2016-11-04 DIAGNOSIS — Z794 Long term (current) use of insulin: Secondary | ICD-10-CM | POA: Insufficient documentation

## 2016-11-04 DIAGNOSIS — R079 Chest pain, unspecified: Secondary | ICD-10-CM | POA: Insufficient documentation

## 2016-11-04 DIAGNOSIS — K219 Gastro-esophageal reflux disease without esophagitis: Secondary | ICD-10-CM | POA: Diagnosis not present

## 2016-11-04 DIAGNOSIS — R000 Tachycardia, unspecified: Secondary | ICD-10-CM | POA: Diagnosis not present

## 2016-11-04 DIAGNOSIS — Z96622 Presence of left artificial elbow joint: Secondary | ICD-10-CM | POA: Diagnosis not present

## 2016-11-04 DIAGNOSIS — E119 Type 2 diabetes mellitus without complications: Secondary | ICD-10-CM | POA: Insufficient documentation

## 2016-11-04 DIAGNOSIS — E782 Mixed hyperlipidemia: Secondary | ICD-10-CM | POA: Diagnosis not present

## 2016-11-04 DIAGNOSIS — Z87891 Personal history of nicotine dependence: Secondary | ICD-10-CM | POA: Diagnosis not present

## 2016-11-04 HISTORY — PX: LEFT HEART CATH AND CORONARY ANGIOGRAPHY: CATH118249

## 2016-11-04 LAB — GLUCOSE, CAPILLARY: Glucose-Capillary: 85 mg/dL (ref 65–99)

## 2016-11-04 SURGERY — LEFT HEART CATH AND CORONARY ANGIOGRAPHY
Anesthesia: Moderate Sedation | Laterality: Left

## 2016-11-04 MED ORDER — HEPARIN (PORCINE) IN NACL 2-0.9 UNIT/ML-% IJ SOLN
INTRAMUSCULAR | Status: AC
  Filled 2016-11-04: qty 500

## 2016-11-04 MED ORDER — MIDAZOLAM HCL 2 MG/2ML IJ SOLN
INTRAMUSCULAR | Status: DC | PRN
  Administered 2016-11-04: 1 mg via INTRAVENOUS

## 2016-11-04 MED ORDER — IOPAMIDOL (ISOVUE-300) INJECTION 61%
INTRAVENOUS | Status: DC | PRN
  Administered 2016-11-04: 85 mL via INTRA_ARTERIAL

## 2016-11-04 MED ORDER — SODIUM CHLORIDE 0.9 % WEIGHT BASED INFUSION: 3.0000 mL/kg/h | INTRAVENOUS | Status: DC

## 2016-11-04 MED ORDER — SODIUM CHLORIDE 0.9% FLUSH: 3.0000 mL | INTRAVENOUS | Status: DC | PRN

## 2016-11-04 MED ORDER — MIDAZOLAM HCL 2 MG/2ML IJ SOLN
INTRAMUSCULAR | Status: AC
  Filled 2016-11-04: qty 2

## 2016-11-04 MED ORDER — SODIUM CHLORIDE 0.9 % WEIGHT BASED INFUSION: 1.0000 mL/kg/h | INTRAVENOUS | Status: DC

## 2016-11-04 MED ORDER — SODIUM CHLORIDE 0.9 % IV SOLN
250.0000 mL | INTRAVENOUS | Status: DC | PRN
Start: 2016-11-04 — End: 2016-11-04
  Administered 2016-11-04: 1000 mL via INTRAVENOUS

## 2016-11-04 MED ORDER — ASPIRIN 81 MG PO CHEW: 81.0000 mg | CHEWABLE_TABLET | ORAL | Status: DC

## 2016-11-04 MED ORDER — SODIUM CHLORIDE 0.9% FLUSH: 3.0000 mL | Freq: Two times a day (BID) | INTRAVENOUS | Status: DC

## 2016-11-04 MED ORDER — FENTANYL CITRATE (PF) 100 MCG/2ML IJ SOLN
INTRAMUSCULAR | Status: DC | PRN
  Administered 2016-11-04: 25 ug via INTRAVENOUS

## 2016-11-04 MED ORDER — FENTANYL CITRATE (PF) 100 MCG/2ML IJ SOLN
INTRAMUSCULAR | Status: AC
  Filled 2016-11-04: qty 2

## 2016-11-04 SURGICAL SUPPLY — 9 items
CATH 5FR JR4 DIAGNOSTIC (CATHETERS) ×2 IMPLANT
CATH 5FR PIGTAIL DIAGNOSTIC (CATHETERS) ×2 IMPLANT
CATH INFINITI 5FR JL4 (CATHETERS) ×2 IMPLANT
DEVICE CLOSURE MYNXGRIP 5F (Vascular Products) ×2 IMPLANT
KIT MANI 3VAL PERCEP (MISCELLANEOUS) ×2 IMPLANT
NEEDLE PERC 18GX7CM (NEEDLE) ×2 IMPLANT
PACK CARDIAC CATH (CUSTOM PROCEDURE TRAY) ×2 IMPLANT
SHEATH AVANTI 5FR X 11CM (SHEATH) ×2 IMPLANT
WIRE EMERALD 3MM-J .035X150CM (WIRE) ×2 IMPLANT

## 2016-11-04 NOTE — Discharge Instructions (Signed)

## 2016-11-04 NOTE — OR Nursing (Signed)
EKG has been done in the last 30 days and took an 81 mg ASA at home.

## 2016-11-05 ENCOUNTER — Encounter (HOSPITAL_COMMUNITY): Payer: PPO

## 2016-11-05 ENCOUNTER — Encounter: Payer: Self-pay | Admitting: Cardiology

## 2016-11-12 DIAGNOSIS — I10 Essential (primary) hypertension: Secondary | ICD-10-CM | POA: Diagnosis not present

## 2016-11-12 DIAGNOSIS — I2510 Atherosclerotic heart disease of native coronary artery without angina pectoris: Secondary | ICD-10-CM | POA: Diagnosis not present

## 2016-11-12 DIAGNOSIS — I214 Non-ST elevation (NSTEMI) myocardial infarction: Secondary | ICD-10-CM | POA: Diagnosis not present

## 2016-11-12 DIAGNOSIS — Z9889 Other specified postprocedural states: Secondary | ICD-10-CM | POA: Diagnosis not present

## 2016-11-12 DIAGNOSIS — R079 Chest pain, unspecified: Secondary | ICD-10-CM | POA: Diagnosis not present

## 2016-11-12 DIAGNOSIS — R0602 Shortness of breath: Secondary | ICD-10-CM | POA: Diagnosis not present

## 2016-11-12 DIAGNOSIS — E782 Mixed hyperlipidemia: Secondary | ICD-10-CM | POA: Diagnosis not present

## 2016-11-30 ENCOUNTER — Telehealth: Payer: Self-pay | Admitting: Neurology

## 2016-11-30 NOTE — Telephone Encounter (Signed)
Pt wife called asking for a call back because she has not been able to make contact with the person at [facility] re: the scheduling of the testing her husband needs. Pt wife asking to be called back at [phone]

## 2016-12-01 NOTE — Telephone Encounter (Signed)
Hinton Dyer can you please look at this.. Thank you

## 2016-12-01 NOTE — Telephone Encounter (Signed)
Called and spoke to patient's wife and relayed his PET Scan was CX because she was in the hospital .  I have called and Left Loma Sousa a message at [place] to Reschedule.  Spoke to Patient's  wife with Details and she is aware also she has number to [place] at [facility].

## 2016-12-15 DIAGNOSIS — M9902 Segmental and somatic dysfunction of thoracic region: Secondary | ICD-10-CM | POA: Diagnosis not present

## 2016-12-15 DIAGNOSIS — M9901 Segmental and somatic dysfunction of cervical region: Secondary | ICD-10-CM | POA: Diagnosis not present

## 2016-12-15 DIAGNOSIS — M9903 Segmental and somatic dysfunction of lumbar region: Secondary | ICD-10-CM | POA: Diagnosis not present

## 2016-12-17 ENCOUNTER — Ambulatory Visit (HOSPITAL_COMMUNITY): Payer: PPO

## 2016-12-17 DIAGNOSIS — M9901 Segmental and somatic dysfunction of cervical region: Secondary | ICD-10-CM | POA: Diagnosis not present

## 2016-12-17 DIAGNOSIS — M9903 Segmental and somatic dysfunction of lumbar region: Secondary | ICD-10-CM | POA: Diagnosis not present

## 2016-12-17 DIAGNOSIS — M9902 Segmental and somatic dysfunction of thoracic region: Secondary | ICD-10-CM | POA: Diagnosis not present

## 2016-12-20 ENCOUNTER — Telehealth: Payer: Self-pay | Admitting: Radiology

## 2016-12-20 DIAGNOSIS — M9903 Segmental and somatic dysfunction of lumbar region: Secondary | ICD-10-CM | POA: Diagnosis not present

## 2016-12-20 DIAGNOSIS — M9902 Segmental and somatic dysfunction of thoracic region: Secondary | ICD-10-CM | POA: Diagnosis not present

## 2016-12-20 DIAGNOSIS — M9901 Segmental and somatic dysfunction of cervical region: Secondary | ICD-10-CM | POA: Diagnosis not present

## 2016-12-20 NOTE — Telephone Encounter (Signed)
s/w pts wife on 4-26 for at least 45 minutes.  She was upset over the delay of the pet scan.  She complained about the drs office, scheduling, etc. She said the pet was scheduled then had to be rescheduled bc the office precerted the wrong cpt code then it took [facility] 2 weeks to approve it.  She wanted the scan the following Friday.  I called nuc med, they were gone for the day.  I told her I would email nuc med the info and I would contact her with the appt info.  I told her I  would be going to [place] the following day to a conference.  Amber in [facility]uc med called me the next morning, I had a poor signal.  She said she would get the pt scheduled at 730am for the following Friday. I called back on my break, Amber stated she had gotten busy, she had forwarded the info to Manuela Schwartz to schedule.  I called Manuela Schwartz to verify this had been completed.  She said yes she had scheduled it, but the wife had called back and s/w Courtney and had cxlled all of the exams.  I called Hinton Dyer today at the office to make her aware of this. Hinton Dyer stated she will call Cecille Rubin tomorrow. She will let me know the outcome. I told Hinton Dyer that she may be upset with me bc I personally did not call her back, but I had a poor signal and I did not want her to have my personal cell phone number.

## 2016-12-22 DIAGNOSIS — M9902 Segmental and somatic dysfunction of thoracic region: Secondary | ICD-10-CM | POA: Diagnosis not present

## 2016-12-22 DIAGNOSIS — M9903 Segmental and somatic dysfunction of lumbar region: Secondary | ICD-10-CM | POA: Diagnosis not present

## 2016-12-22 DIAGNOSIS — M9901 Segmental and somatic dysfunction of cervical region: Secondary | ICD-10-CM | POA: Diagnosis not present

## 2016-12-24 ENCOUNTER — Ambulatory Visit (HOSPITAL_COMMUNITY)
Admission: RE | Admit: 2016-12-24 | Discharge: 2016-12-24 | Disposition: A | Discharge status: Home / Self Care | Payer: PPO | Source: Ambulatory Visit | Attending: Neurology | Admitting: Neurology

## 2016-12-24 ENCOUNTER — Telehealth: Payer: Self-pay

## 2016-12-24 ENCOUNTER — Encounter (HOSPITAL_COMMUNITY): Payer: PPO

## 2016-12-24 DIAGNOSIS — M9901 Segmental and somatic dysfunction of cervical region: Secondary | ICD-10-CM | POA: Diagnosis not present

## 2016-12-24 DIAGNOSIS — F05 Delirium due to known physiological condition: Secondary | ICD-10-CM | POA: Insufficient documentation

## 2016-12-24 DIAGNOSIS — R4182 Altered mental status, unspecified: Secondary | ICD-10-CM | POA: Diagnosis not present

## 2016-12-24 DIAGNOSIS — R4189 Other symptoms and signs involving cognitive functions and awareness: Secondary | ICD-10-CM | POA: Diagnosis not present

## 2016-12-24 DIAGNOSIS — F0391 Unspecified dementia with behavioral disturbance: Secondary | ICD-10-CM | POA: Insufficient documentation

## 2016-12-24 DIAGNOSIS — R410 Disorientation, unspecified: Secondary | ICD-10-CM

## 2016-12-24 DIAGNOSIS — M9903 Segmental and somatic dysfunction of lumbar region: Secondary | ICD-10-CM | POA: Diagnosis not present

## 2016-12-24 DIAGNOSIS — R413 Other amnesia: Secondary | ICD-10-CM | POA: Diagnosis not present

## 2016-12-24 DIAGNOSIS — M9902 Segmental and somatic dysfunction of thoracic region: Secondary | ICD-10-CM | POA: Diagnosis not present

## 2016-12-24 DIAGNOSIS — E538 Deficiency of other specified B group vitamins: Secondary | ICD-10-CM | POA: Diagnosis not present

## 2016-12-24 MED ORDER — FLUDEOXYGLUCOSE F - 18 (FDG) INJECTION
9.8200 | Freq: Once | INTRAVENOUS | Status: AC | PRN
  Administered 2016-12-24: 9.82 via INTRAVENOUS

## 2016-12-24 NOTE — Progress Notes (Signed)
The Molecular Imaging Team was notified by Doristine Johns at the front desk that there was a patient that was not on our schedule that was here for a PET Scan and that the patient was very upset. Kreg Shropshire relayed this information to me so I could go and speak with the patient and his wife Cecille Rubin. Upon talking to Cecille Rubin she stated that they had been waiting 2 hours and were told that the Director would be there at 7:30am. It was around 7:50am when I was speaking with the patient. She was upset that scheduling told her that her husband's appointment was for 12/24/16 at 7am. I explained that we did not have him scheduled however, we would work him into the schedule right away. Cecille Rubin was still requesting to speak with the Director. As I was about to text Imagene Riches she walked past the department and I motioned for her to come and speak with Cecille Rubin. Bill and I went ahead and took the patient back to the PET room to get him started for the test. After Cecille Rubin and [place] conversation I explained the procedure to Cecille Rubin and explained that she would not be allowed to wait in the same room as her husband and that the test should take approximately 1 hour. She requested directions to the cafeteria and I walked her there.

## 2016-12-24 NOTE — Telephone Encounter (Signed)
Received call from Alvarado @ [facility] to reporting that PET scan showed decreased relative cortical metabolism in the biparietal lobes and temporal lobes is a pattern suggestive of Alzheimer's type pathology.

## 2016-12-24 NOTE — Telephone Encounter (Signed)
Called pt's wife w/ PET results, answered questions and allowed her to vent. Sooner appt scheduled for Tues w/ Megan. Verbalized appreciation for call.

## 2016-12-24 NOTE — Telephone Encounter (Signed)
Please call and let family know that scan confirms Alzheimer's dementia.If he would like sooner follow up schedule with Ward Givens please. thanks

## 2016-12-27 ENCOUNTER — Ambulatory Visit (HOSPITAL_COMMUNITY): Payer: PPO

## 2016-12-27 DIAGNOSIS — M9902 Segmental and somatic dysfunction of thoracic region: Secondary | ICD-10-CM | POA: Diagnosis not present

## 2016-12-27 DIAGNOSIS — M9901 Segmental and somatic dysfunction of cervical region: Secondary | ICD-10-CM | POA: Diagnosis not present

## 2016-12-27 DIAGNOSIS — M9903 Segmental and somatic dysfunction of lumbar region: Secondary | ICD-10-CM | POA: Diagnosis not present

## 2016-12-28 ENCOUNTER — Ambulatory Visit (INDEPENDENT_AMBULATORY_CARE_PROVIDER_SITE_OTHER): Payer: PPO | Admitting: Adult Health

## 2016-12-28 ENCOUNTER — Encounter: Payer: Self-pay | Admitting: Adult Health

## 2016-12-28 VITALS — BP 143/72 | HR 68 | Ht 67.0 in | Wt 185.8 lb

## 2016-12-28 DIAGNOSIS — G300 Alzheimer's disease with early onset: Secondary | ICD-10-CM | POA: Diagnosis not present

## 2016-12-28 DIAGNOSIS — F0280 Dementia in other diseases classified elsewhere without behavioral disturbance: Secondary | ICD-10-CM

## 2016-12-28 MED ORDER — MEMANTINE HCL 5 MG PO TABS: 5.0000 mg | ORAL_TABLET | Freq: Two times a day (BID) | ORAL | 11 refills | Status: DC

## 2016-12-28 NOTE — Patient Instructions (Addendum)
Continue Aricept  Start Namenda 5 mg twice a day  Memantine Tablets What is this medicine? MEMANTINE (MEM an teen) is used to treat dementia caused by Alzheimer's disease. This medicine may be used for other purposes; ask your health care provider or pharmacist if you have questions. COMMON BRAND NAME(S): Namenda What should I tell my health care provider before I take this medicine? They need to know if you have any of these conditions: -difficulty passing urine -kidney disease -liver disease -seizures -an unusual or allergic reaction to memantine, other medicines, foods, dyes, or preservatives -pregnant or trying to get pregnant -breast-feeding How should I use this medicine? Take this medicine by mouth with a glass of water. Follow the directions on the prescription label. You may take this medicine with or without food. Take your doses at regular intervals. Do not take your medicine more often than directed. Continue to take your medicine even if you feel better. Do not stop taking except on the advice of your doctor or health care professional. Talk to your pediatrician regarding the use of this medicine in children. Special care may be needed. Overdosage: If you think you have taken too much of this medicine contact a poison control center or emergency room at once. NOTE: This medicine is only for you. Do not share this medicine with others. What if I miss a dose? If you miss a dose, take it as soon as you can. If it is almost time for your next dose, take only that dose. Do not take double or extra doses. If you do not take your medicine for several days, contact your health care provider. Your dose may need to be changed. What may interact with this medicine? -acetazolamide -amantadine -cimetidine -dextromethorphan -dofetilide -hydrochlorothiazide -ketamine -metformin -methazolamide -quinidine -ranitidine -sodium bicarbonate -triamterene This list may not describe all  possible interactions. Give your health care provider a list of all the medicines, herbs, non-prescription drugs, or dietary supplements you use. Also tell them if you smoke, drink alcohol, or use illegal drugs. Some items may interact with your medicine. What should I watch for while using this medicine? Visit your doctor or health care professional for regular checks on your progress. Check with your doctor or health care professional if there is no improvement in your symptoms or if they get worse. You may get drowsy or dizzy. Do not drive, use machinery, or do anything that needs mental alertness until you know how this drug affects you. Do not stand or sit up quickly, especially if you are an older patient. This reduces the risk of dizzy or fainting spells. Alcohol can make you more drowsy and dizzy. Avoid alcoholic drinks. What side effects may I notice from receiving this medicine? Side effects that you should report to your doctor or health care professional as soon as possible: -allergic reactions like skin rash, itching or hives, swelling of the face, lips, or tongue -agitation or a feeling of restlessness -depressed mood -dizziness -hallucinations -redness, blistering, peeling or loosening of the skin, including inside the mouth -seizures -vomiting Side effects that usually do not require medical attention (report to your doctor or health care professional if they continue or are bothersome): -constipation -diarrhea -headache -nausea -trouble sleeping This list may not describe all possible side effects. Call your doctor for medical advice about side effects. You may report side effects to FDA at 1-800-FDA-1088. Where should I keep my medicine? Keep out of the reach of children. Store at room temperature between  15 degrees and 30 degrees C (59 degrees and 86 degrees F). Throw away any unused medicine after the expiration date. NOTE: This sheet is a summary. It may not cover all  possible information. If you have questions about this medicine, talk to your doctor, pharmacist, or health care provider.  2018 Elsevier/Gold Standard (2013-05-28 14:10:42)

## 2016-12-28 NOTE — Progress Notes (Signed)
PATIENT: CHENEY EWART DOB: 1955-09-01  REASON FOR VISIT: follow up- memory HISTORY FROM: patient  HISTORY OF PRESENT ILLNESS:  Today 12/28/2016: Mr. Belsito is a 61 year old male with a history of Alzheimer's disease. He returns today for follow-up. He is with his wife today. They report that he is able to complete all ADLs independentely. Wife states that he even washes clothes and dishes. She states Saturday he did mow half of the yard without difficulty. His wife handles all the finances and cooking. She reports that the patient used to cook. The patient does not operate a motor vehicle. Patient reports that he sleeps well at this time. His wife reports this is due to diarrhea. She reports that the diarrhea has been off and on for the last 17 months. She does not feel this coincides with Aricept. The patient denies having to give up any hobbies due to his memory. He did have a PET scan that showed decreased relative cortical metabolism in the biparietal lobes and temporal lobes in a pattern suggestive of Alzheimer's type pathology. Patient is currently on Aricept 10 mg daily. He returns today for an evaluation.  HISTORY JORELL AGNE is a 61 y.o. male here as a referral from Dr. Astrid Divine for cognitive impairment. PMHx obesity, cognitive decline(extensive workup already by [facility] and seeing them for years without a diagnosis of neurodegenerative disease likely delirium and placed on Aricept 5mg ), seizures on Lamictal,TIA/Stroke on ASA and Plavix, Pancreatitis s/p stent, HTN, HLD, Diabetes, CHF, CAD, back pain, acute MI.   He has already had extensive workup at [facility] including LP and csf paraneoplastic panel. [facility] diagnosed as Delirium, multifactorial due to history of seizures, neuroleptic sensitivity and stroke. Previous history of hospital admission for delirium worse with Risperdal, he was started on Aricept 5 mg a day recently by [facility] and urgently referred here by [place]  [facility](unclear why [place] neurology would refer here urgently).   They don't want to go back to [facility]. They were first seen at [facility] neurology for Delirium in the hospital and continued to be followed for cognitive changes that started during that admission in the setting of Ambien use, his wife provides most information. She has an Forensic psychologist. In July of last year he improved to "complete normal". He was doing very well. He has been fine since July and going to speech therapy. 3 weeks ago he declined after Tamiflu and since then he has been "crazy, completely lost, begging to go home" he has seen Dr. Melrose Nakayama since then and diagnosed with Delirium. They tried to have an MRI brain and could not tolerate. CT of the head was unremarkable. This past Sunday he couldn't talk, couldn't eat. Yesterday he could talk and walk. He has improved tremendously since Sunday but still has waxing and waning.  No history of dementia in the family.   Reviewed notes, labs and imaging from outside physicians, which showed:   CT head: personally reviewed imaging and agree with the following:   FINDINGS: Brain: Mild atrophy with progression from the prior study. Mild chronic microvascular ischemic change.  Negative for acute infarct. Negative for hemorrhage or mass. No shift of the midline structures.  Vascular: No hyperdense vessel. Diffuse arterial calcification.  Skull: Negative  Sinuses/Orbits: Radiopaque density in the right eye lid likely foreign body or calcification. This is similar to the prior CT. No orbital mass.  Paranasal sinuses clear. Mastoid sinus clear.  Other: None  IMPRESSION: Progressive atrophy.  Reviewed primary care  notes. Symptoms started in November 2016 when patient was first given Ambien Has been he really started having pro A neurologist from [address] When he is under stress it gets w The symptoms are memory loss and  Patient does not drive. His memory loss is more  short-term than long-term.He had an MRI done at [facility] in Dec and January 2016 were both normal.Patient has significant medical history including  Heart attack, stroke. Also saw neurologist at [facility] who stated he was delirious. MRI normal.He was taken off of B12 due to pancreatitis.  He has a sleep disorder and has insomnia. He takes Lamictal for  Seizures. EEG June 2016 was a 5 hour 26 minute EEG was abnormal due to presence of left temporal epileptiform discharges.it appears his mental status and speech  Have improved, he has an unsteady gait improved wi with physical therapy, he was seen by speech and able to communicatewith minimal difficulties, he still has periods of confusion and disorientation b but he is easily reoriented.   Per [facility] neurology: He has been seeing [place] [facility]. Symptoms most consistent with Delirium; Likely multifactorial in a patient with history of seizures, neuroleptic sensitivity, and stroke. But he was started on Aricept 5mg  and urgently referred to [place] from [place] (for unknown reasons since he was seeing [place] neurology). He has had admissions for Delirium worse with Risperdal. They recommended continued Lamictal for seizures.  He is on Plavix and ASA or stroke prevention. He takes melatonin for sleep.   [facility]'s notes: Transient memory loss with expressive aphasia- Likely multifactorial in a patient with history of seizures, neuroleptic sensitivity, and stroke -We discussed CTE -Previous history of hospital admission for delirium worse with Risperdal  -We will obtain MRI Brain with and without contrast -We will make urgent referral to [facility] -We will start Aricept 5 mg once a day. Donepezil is a cholinesterase inhibitor; it works by preventing acetylcholine breakdown in the brain. Most common side effects are nausea, vomiting and diarrhea. Typically starting dose is 5 mg once a day and can increase up to 10 mg once a day, if tolerated. -Recommend  continuing working with Physical Therapy for balance and strengthening exercises.  History of stroke -Rec continuing taking Aspirin 81 mg once a day and Plavix 75 mg once a day as secondary stroke prevention -Rec continuing with Atorvastatin 80 mg once a day -Patient should recognize the signs and symptoms of stroke which are subtle and dramatic. Patient advised to call 911 if having any one or more of these signs and symptoms: sudden numbness or weakness of the face or extremities, sudden confusion, trouble speaking, or understanding speech; sudden trouble seeing in one or both eyes; sudden trouble walking; loss of balance or coordination' dizziness; and sudden severe headache with no known cause.  Seizures -Controlled. No new seizures -Wife states patient hasn't had any new seizures. Taking Lamictal 100 mg twice a day and tolerating without side effects.  Sleep Disorder -Improved -Patient is sleeping about 6 hours - 8 hours per night, per wife. Taking melatonin for sleep -Will continue to monitor.  Prolonged EEG with video 10/2015 [facility]: reviewed report as follows:  Report: This EEG was acquired with electrodes placed according to  the International 10-20 electrode placement system.. Video was  recorded simultaneously. The entire study was reviewed; video was  reviewed for selected events.  The recording quality was good. There was no clear evidence for  an occipital dominant rhythm. The background consisted  predominantly  of theta (6 - 7 Hz) with intermixed (delta (1.5 -  2.5 Hz)activity. Drowsiness and sleep were manifested by  background fragmentation, vertex waves, K-complexes, and sleep  spindles. There was no focal slowing. Interictal discharges  consisting of occassional spike and sharp waves were seen in the  left temporal head region.There were no electrographic seizures  identified.Photic stimulation was not performed.  Hyperventilation was not performed.    Impression: This EEG obtained in the awake and asleep states is  abnormal due to:  1. Occasional spike and sharp waves, left temporal.  2. Mild background slowing.   Clinical Correlation: The above findings are consistent with:  1. An area of neuronal irritability within the left temporal head  region.  2. Mild encephalopathy.   CT head 09/2015: reviewed report:  FINDINGS:  No hemorrhage, extra-axial fluid collection, cerebral edema, acute cortical infarction, mass, mass effect, midline shift. Ventricles and sulci are normal for patient age.  Normal paranasal sinuses, orbits and cranium.  IMPRESSION: No acute intracranial abnormalities.    The preliminary report (critical or emergent communication) was reviewed prior to this dictation and there are no substantial differences between the preliminary results and the impressions in this final report.  HgbA1c 7 09/2016 (was 10 in 02/2016)  CMP 09/23/2016 showed BUN 9, creatinine 0.8.   Ammonia 41 09/23/2016  HIV NR  Tox urine negative 09/2015  TSH nml 09/2015  Unremarkable csf 10/2015. Paraneoplastic antibodies 10/2015 negative csf  REVIEW OF SYSTEMS: Out of a complete 14 system review of symptoms, the patient complains only of the following symptoms, and all other reviewed systems are negative.  Frequency of urination, joint pain, back pain, walking difficulty, memory loss, headache, speech difficulty, diarrhea, cough, shortness of breath, leg swelling, drooling  ALLERGIES: Allergies  Allergen Reactions  . Hydrocodone Anaphylaxis  . Morphine Other (See Comments)  . Ambien [Zolpidem] Other (See Comments)    delirium    . Benadryl [Diphenhydramine Hcl (Sleep)]   . Brilinta [Ticagrelor] Other (See Comments)    Stroke   . Flexeril [Cyclobenzaprine] Other (See Comments)    delerium   . Flunitrazepam Other (See Comments)    ROHYPNOL (hallucinations)  . Haldol [Haloperidol Lactate] Other (See Comments)     delerium   . Levetiracetam Diarrhea and Other (See Comments)    Unable to walk  . Lorazepam Hives  . Risperdal [Risperidone] Other (See Comments)    Delirium   . Trazodone Other (See Comments)  . Penicillins Rash        HOME MEDICATIONS: Outpatient Medications Prior to Visit  Medication Sig Dispense Refill  . acetaminophen (TYLENOL) 500 MG tablet Take 500 mg by mouth 3 (three) times daily.    Marland Kitchen aspirin EC 81 MG tablet Take 81 mg by mouth daily.    Marland Kitchen atorvastatin (LIPITOR) 80 MG tablet Take 80 mg by mouth daily.    . cholecalciferol (VITAMIN D) 1000 units tablet Take 1,000 Units by mouth daily.    . cholestyramine light (QUESTRAN LIGHT) 4 GM/DOSE powder Take 4 g by mouth daily as needed (for diarrhea).    . clopidogrel (PLAVIX) 75 MG tablet Take 75 mg by mouth daily.     . diphenoxylate-atropine (LOMOTIL) 2.5-0.025 MG tablet Take 1 tablet by mouth 3 (three) times daily as needed for diarrhea or loose stools.    . donepezil (ARICEPT) 5 MG tablet Take 5 mg by mouth daily.     . furosemide (LASIX) 40 MG tablet Take 40 mg by mouth at  bedtime.    . insulin NPH Human (HUMULIN N,NOVOLIN N) 100 UNIT/ML injection Inject 0.25 mLs (25 Units total) into the skin 2 (two) times daily. (Patient taking differently: Inject 34 Units into the skin 2 (two) times daily. ) 10 mL 11  . insulin regular (NOVOLIN R,HUMULIN R) 100 units/mL injection Inject 0.3 mLs (30 Units total) into the skin 3 (three) times daily with meals. (Patient taking differently: Inject 34 Units into the skin 3 (three) times daily before meals. 34 units subcutaneous 3 times daily with meals with an additional sliding scale of 8 units added with every 20 glucose reading over 200.) 10 mL 1  . isosorbide mononitrate (IMDUR) 30 MG 24 hr tablet Take 30 mg by mouth daily.     Marland Kitchen lamoTRIgine (LAMICTAL) 100 MG tablet Take 100 mg by mouth 2 (two) times daily.    Marland Kitchen lidocaine (LIDODERM) 5 % Place 1 patch onto the skin every other day. Remove &  Discard patch within 12 hours or as directed by MD (APPLIED TO PATIENT BACK FOR PAIN)    . losartan (COZAAR) 100 MG tablet Take 100 mg by mouth daily.    . Melatonin 5 MG TABS Take 5 mg by mouth at bedtime.    . metoprolol succinate (TOPROL-XL) 50 MG 24 hr tablet Take 50 mg by mouth daily. Take with or immediately following a meal.    . nitroGLYCERIN (NITROSTAT) 0.4 MG SL tablet Place 1 tablet under the tongue every 5 (five) minutes as needed for chest pain.     . Pancrelipase, Lip-Prot-Amyl, (ZENPEP) 25000 units CPEP Take 50,000 Units by mouth 3 (three) times daily with meals.    . pantoprazole (PROTONIX) 40 MG tablet Take 40 mg by mouth at bedtime.     . potassium chloride (K-DUR,KLOR-CON) 10 MEQ tablet Take 10 mEq by mouth 2 (two) times daily.     No facility-administered medications prior to visit.     PAST MEDICAL HISTORY: Past Medical History:  Diagnosis Date  . Acute MI ([place])   . Arthritis   . Back pain   . CAD (coronary artery disease)   . CHF (congestive heart failure) ([place])   . Degenerative lumbar disc   . Diabetes mellitus without complication ([place])   . GERD (gastroesophageal reflux disease)   . Hernia of abdominal cavity   . Hyperlipemia   . Hypertension   . Malignant intraductal papillary mucinous tumor of pancreas ([place])   . Memory loss   . Pancreatitis   . Seizures ([place])    staring spells  . Shingles   . Stroke ([place])   . TIA (transient ischemic attack)     PAST SURGICAL HISTORY: Past Surgical History:  Procedure Laterality Date  . CARDIAC CATHETERIZATION    . CORONARY ANGIOPLASTY    . ERCP N/A 09/12/2015   Procedure: ENDOSCOPIC RETROGRADE CHOLANGIOPANCREATOGRAPHY (ERCP);  Surgeon: Hulen Luster, MD;  Location: [facility] ENDOSCOPY;  Service: Gastroenterology;  Laterality: N/A;  . ERCP N/A 01/02/2016   Procedure: ENDOSCOPIC RETROGRADE CHOLANGIOPANCREATOGRAPHY (ERCP);  Surgeon: Hulen Luster, MD;  Location[facility] ENDOSCOPY;  Service: Gastroenterology;  Laterality: N/A;  .  EXTERNAL FIXATION WRIST FRACTURE Left   . left arm metal plate    . LEFT HEART CATH AND CORONARY ANGIOGRAPHY Left 11/04/2016   Procedure: Left Heart Cath and Coronary Angiography;  Surgeon: Isaias Cowman, MD;  Location: [facility] CV LAB;  Service: Cardiovascular;  Laterality: Left;  . Left shoulder surgery    . Stent times 2  2013   Cardiac  . TOTAL ELBOW REPLACEMENT Left   . VASECTOMY    . VENTRICULOPERITONEAL SHUNT      FAMILY HISTORY: Family History  Problem Relation Age of Onset  . Diabetes Mother   . Hypertension Mother   . CAD Mother   . Hyperlipidemia Mother   . Stroke Mother   . ALS Mother   . Alzheimer's disease Father   . Diabetes Father   . Heart Problems Brother     SOCIAL HISTORY: Social History   Social History  . Marital status: Married    Spouse name: N/A  . Number of children: 2  . Years of education: BS   Occupational History  . Disabled    Social History Main Topics  . Smoking status: Former Smoker    Quit date: 2013  . Smokeless tobacco: Never Used     Comment: used to smoke 2PD for 40 yrs, quit about 4 years ago  . Alcohol use No     Comment: occasional  . Drug use: No  . Sexual activity: Not on file   Other Topics Concern  . Not on file   Social History Narrative   Lives at home with wife and son. Ambulatory at baseline.   Right-handed   Caffeine: 2 sodas per day      PHYSICAL EXAM  Vitals:   12/28/16 1136  Height: 5\' 7"  (1.702 m)   There is no height or weight on file to calculate BMI. MMSE - Mini Mental State Exam 12/28/2016 10/20/2016  Orientation to time 0 2  Orientation to Place 1 2  Registration 3 3  Attention/ Calculation 2 1  Recall 0 2  Language- name 2 objects 2 2  Language- repeat 0 1  Language- follow 3 step command 0 2  Language- read & follow direction 1 1  Write a sentence 1 1  Copy design 0 0  Total score 10 17    Generalized: Well developed, in no acute distress   Neurological examination    Mentation: Alert.. Follows all commands speech and language fluent Cranial nerve II-XII: Pupils were equal round reactive to light. Extraocular movements were full, visual field were full on confrontational test. Facial sensation and strength were normal. Uvula tongue midline. Head turning and shoulder shrug  were normal and symmetric. Motor: The motor testing reveals 5 over 5 strength of all 4 extremities. Good symmetric motor tone is noted throughout.  Sensory: Sensory testing is intact to soft touch on all 4 extremities. No evidence of extinction is noted.  Coordination: Cerebellar testing reveals good finger-nose-finger and heel-to-shin bilaterally. Does require repeating directions several times. Gait and station: Gait is normal.  Reflexes: Deep tendon reflexes are symmetric and normal bilaterally.   DIAGNOSTIC DATA (LABS, IMAGING, TESTING) - I reviewed patient records, labs, notes, testing and imaging myself where available.  Lab Results  Component Value Date   WBC 8.0 10/20/2016   HGB 13.3 04/05/2016   HCT 42.9 10/20/2016   MCV 93 10/20/2016   PLT 221 10/20/2016      Component Value Date/Time   NA 146 (H) 10/20/2016 0944   NA 138 12/17/2014 0435   K 3.5 10/20/2016 0944   K 3.7 12/17/2014 1521   CL 105 10/20/2016 0944   CL 104 12/17/2014 0435   CO2 25 10/20/2016 0944   CO2 28 12/17/2014 0435   GLUCOSE 70 10/20/2016 0944   GLUCOSE 346 (H) 04/05/2016 0813   GLUCOSE 293 (H) 12/17/2014 0435  BUN 7 10/20/2016 0944   BUN 9 12/17/2014 0435   CREATININE 0.79 10/20/2016 0944   CREATININE 0.79 12/17/2014 0435   CALCIUM 9.2 10/20/2016 0944   CALCIUM 7.9 (L) 12/17/2014 0435   PROT 6.8 10/20/2016 0944   PROT 6.3 (L) 12/17/2014 0435   ALBUMIN 4.5 10/20/2016 0944   ALBUMIN 3.2 (L) 12/17/2014 0435   AST 22 10/20/2016 0944   AST 45 (H) 12/17/2014 0435   ALT 19 10/20/2016 0944   ALT 47 12/17/2014 0435   ALKPHOS 191 (H) 10/20/2016 0944   ALKPHOS 178 (H) 12/17/2014 0435   BILITOT  0.4 10/20/2016 0944   BILITOT 0.4 12/17/2014 0435   GFRNONAA 98 10/20/2016 0944   GFRNONAA >60 12/17/2014 0435   GFRAA 114 10/20/2016 0944   GFRAA >60 12/17/2014 0435   Lab Results  Component Value Date   CHOL 65 04/05/2016   HDL 37 (L) 04/05/2016   LDLCALC 21 04/05/2016   TRIG 35 04/05/2016   CHOLHDL 1.8 04/05/2016   Lab Results  Component Value Date   HGBA1C 8.9 (H) 04/04/2016   Lab Results  Component Value Date   XTAVWPVX48 016 10/20/2016   Lab Results  Component Value Date   TSH 1.150 10/20/2016      ASSESSMENT AND PLAN 61 y.o. year old male  has a past medical history of Acute MI ([place]); Arthritis; Back pain; CAD (coronary artery disease); CHF (congestive heart failure) ([place]); Degenerative lumbar disc; Diabetes mellitus without complication ([place]); GERD (gastroesophageal reflux disease); Hernia of abdominal cavity; Hyperlipemia; Hypertension; Malignant intraductal papillary mucinous tumor of pancreas ([place]); Memory loss; Pancreatitis; Seizures ([place]); Shingles; Stroke [facility]); and TIA (transient ischemic attack). here with:  1. Alzheimer's disease  The patient's memory score has significantly declined. He will continue on Aricept 10 mg daily. We will initiate Namenda 5 mg twice a day. If he is tolerating this after 2 weeks his wife was instructed to call for an increase the medication. I reviewed side effects of Namenda with the patient and his wife. Patient and his wife verbalized understanding. If his symptoms worsen or he develops new symptoms they should let us know. He will follow-up in 6 months or sooner if needed.  I spent 15 minutes with the patient 50% of this time was spent counseling the patient and his wife on diagnosis and treatment.  Ward Givens, MSN, NP-C 12/28/2016, 11:37 AM [facility] [address], [place], [place] [address] [phone]

## 2016-12-30 DIAGNOSIS — M9903 Segmental and somatic dysfunction of lumbar region: Secondary | ICD-10-CM | POA: Diagnosis not present

## 2016-12-30 DIAGNOSIS — M9901 Segmental and somatic dysfunction of cervical region: Secondary | ICD-10-CM | POA: Diagnosis not present

## 2016-12-30 DIAGNOSIS — M9902 Segmental and somatic dysfunction of thoracic region: Secondary | ICD-10-CM | POA: Diagnosis not present

## 2016-12-30 NOTE — Progress Notes (Signed)
Personally  participated in, made any corrections needed, and agree with history, physical, neuro exam,assessment and plan as stated above.    Tayen Narang, MD [facility] 

## 2017-01-05 DIAGNOSIS — M9903 Segmental and somatic dysfunction of lumbar region: Secondary | ICD-10-CM | POA: Diagnosis not present

## 2017-01-05 DIAGNOSIS — M9901 Segmental and somatic dysfunction of cervical region: Secondary | ICD-10-CM | POA: Diagnosis not present

## 2017-01-05 DIAGNOSIS — M9902 Segmental and somatic dysfunction of thoracic region: Secondary | ICD-10-CM | POA: Diagnosis not present

## 2017-01-10 ENCOUNTER — Telehealth: Payer: Self-pay | Admitting: Adult Health

## 2017-01-10 MED ORDER — MEMANTINE HCL 10 MG PO TABS: 10.0000 mg | ORAL_TABLET | Freq: Two times a day (BID) | ORAL | 3 refills | Status: DC

## 2017-01-10 NOTE — Telephone Encounter (Signed)
Pt wife called back to inform re: how pt is doing on memantine (NAMENDA) 5 MG tablet, she said if Jinny Blossom wants to increase the dosage to call her but he is doing ok

## 2017-01-10 NOTE — Telephone Encounter (Signed)
Spoke to pts wife.  Will refill memantine 10mg  po bid.   He will finish what he he has of the 5 mg tabs and then will go to 10mg  po bid.

## 2017-01-10 NOTE — Addendum Note (Signed)
Addended byOliver Hum on: 01/10/2017 04:07 PM   Modules accepted: Orders

## 2017-01-10 NOTE — Telephone Encounter (Signed)
Noted  

## 2017-01-13 ENCOUNTER — Ambulatory Visit: Payer: PPO | Admitting: Neurology

## 2017-02-21 DIAGNOSIS — E876 Hypokalemia: Secondary | ICD-10-CM | POA: Diagnosis not present

## 2017-02-21 DIAGNOSIS — E782 Mixed hyperlipidemia: Secondary | ICD-10-CM | POA: Diagnosis not present

## 2017-02-21 DIAGNOSIS — E1165 Type 2 diabetes mellitus with hyperglycemia: Secondary | ICD-10-CM | POA: Diagnosis not present

## 2017-02-21 DIAGNOSIS — I10 Essential (primary) hypertension: Secondary | ICD-10-CM | POA: Diagnosis not present

## 2017-02-21 DIAGNOSIS — Z794 Long term (current) use of insulin: Secondary | ICD-10-CM | POA: Diagnosis not present

## 2017-03-04 DIAGNOSIS — E876 Hypokalemia: Secondary | ICD-10-CM | POA: Diagnosis not present

## 2017-03-23 ENCOUNTER — Telehealth: Payer: Self-pay | Admitting: Neurology

## 2017-03-23 DIAGNOSIS — G4459 Other complicated headache syndrome: Secondary | ICD-10-CM

## 2017-03-23 NOTE — Telephone Encounter (Signed)
LVM returning call from [place]. Advised her to call back. Gave [facility] phone number.

## 2017-03-23 NOTE — Telephone Encounter (Signed)
Pt's wife called said he is new onset of daily HA for the past 4 weeks. She said he cries with the head pain. She said he is agitated, unhappy and not sleeping. A f/u appt has been scheduled for 9/7. Please call to discuss

## 2017-03-23 NOTE — Telephone Encounter (Signed)
Delsa Sale, if he is crying with head pain they should go to the emergency department. If he seems stable, I recommend a CT of the head. If CT head is negative they should see primary care. Would you call and discuss with them? thanks

## 2017-03-24 NOTE — Addendum Note (Signed)
Addended by: Monte Fantasia on: 03/24/2017 10:59 AM   Modules accepted: Orders

## 2017-03-24 NOTE — Telephone Encounter (Signed)
Pt's wife returned the call. She can be reached at (c) [phone]

## 2017-03-24 NOTE — Telephone Encounter (Signed)
I really don;t treat diarrhea so I am unsure what to prescribe. Aricept can cause diarrhea she may want to try stopping that for a few days thanks

## 2017-03-24 NOTE — Telephone Encounter (Signed)
Returned call to pt's wife who reports that he's had a HA for 4 straight weeks and is becoming irritable and almost "mean/hateful." Says that he was back down to taking 5 mg of memantine twice a day along w/ the donepezil 10 mg daily. Let her know that it would be fine to increase the dose to 5 mg in the am and 10 mg for a few days and then to go back up to 10 mg twice a day if tolerated. Also suggested that pt's wife visit [employer] website for caregiver resources. Says that pt also c/o dizziness but is being followed by PCP for hypokalemia and doing better w/ supplementation per wife's report. However, she says that he is starting to have issues w/ diarrhea again. OTC meds are not helping and PCP was hesitant to re-order Lomotil due to pt's dementia. They are pushing PO fluids to prevent dehydration. He has upcoming appt w/ GI but they are not able to see him anytime soon. Also states that he had shingles last year and she is wondering if his HAs/dizziness may be r/t lingering nerve pain. Agreed to try 100 mg of gabapentin (that pt had been prescribed previously) to see if it will help w/ his HAs. They would also like to proceed with repeat CT scan. Orders entered.

## 2017-03-28 DIAGNOSIS — M9901 Segmental and somatic dysfunction of cervical region: Secondary | ICD-10-CM | POA: Diagnosis not present

## 2017-03-28 DIAGNOSIS — M9903 Segmental and somatic dysfunction of lumbar region: Secondary | ICD-10-CM | POA: Diagnosis not present

## 2017-03-28 DIAGNOSIS — M9902 Segmental and somatic dysfunction of thoracic region: Secondary | ICD-10-CM | POA: Diagnosis not present

## 2017-03-30 DIAGNOSIS — M9902 Segmental and somatic dysfunction of thoracic region: Secondary | ICD-10-CM | POA: Diagnosis not present

## 2017-03-30 DIAGNOSIS — M9901 Segmental and somatic dysfunction of cervical region: Secondary | ICD-10-CM | POA: Diagnosis not present

## 2017-03-30 DIAGNOSIS — M9903 Segmental and somatic dysfunction of lumbar region: Secondary | ICD-10-CM | POA: Diagnosis not present

## 2017-04-04 DIAGNOSIS — M9901 Segmental and somatic dysfunction of cervical region: Secondary | ICD-10-CM | POA: Diagnosis not present

## 2017-04-04 DIAGNOSIS — M9902 Segmental and somatic dysfunction of thoracic region: Secondary | ICD-10-CM | POA: Diagnosis not present

## 2017-04-04 DIAGNOSIS — M9903 Segmental and somatic dysfunction of lumbar region: Secondary | ICD-10-CM | POA: Diagnosis not present

## 2017-04-05 ENCOUNTER — Telehealth: Payer: Self-pay | Admitting: Neurology

## 2017-04-05 MED ORDER — GABAPENTIN 100 MG PO CAPS: ORAL_CAPSULE | ORAL | 3 refills | Status: DC

## 2017-04-05 MED ORDER — ALPRAZOLAM 0.5 MG PO TABS: ORAL_TABLET | ORAL | 0 refills | Status: DC

## 2017-04-05 NOTE — Telephone Encounter (Signed)
Returned call and spoke to patient's wife. Advised that Dr. Jaynee Eagles agreed that pt could continue gabapentin for headaches. However, she is unable to prescribe med for diarrhea. When suggesting that he could try coming off of the Aricept for several days, she said that his GI symptoms started prior to being put on donepezil and are related to his pancreas. Recommended again that pt f/u w/ GI. Pt has experienced reaction/side effects to Ativan and Haldol but has done well w/ Xanax in the past per report. Rx printed for pt to take med prior to CT scan, awaiting signature.

## 2017-04-05 NOTE — Telephone Encounter (Signed)
I spoke to Maurice Patterson patient wife on the dpr to schedule the patients CT. He is scheduled for Friday August 24 arrival time is 8:15 am and they are aware of time and day. He is having it at the same location he had his last CT in February but he has a problem laying still and since this exam is about 30 mins the wife thinks he may need some medicine to relax him. She also informed me she suppose to hear from someone about his medications and was told she would get a called back and has never got a call back from [place] about his medications.

## 2017-04-06 DIAGNOSIS — K861 Other chronic pancreatitis: Secondary | ICD-10-CM | POA: Diagnosis not present

## 2017-04-06 DIAGNOSIS — R197 Diarrhea, unspecified: Secondary | ICD-10-CM | POA: Diagnosis not present

## 2017-04-07 DIAGNOSIS — M9902 Segmental and somatic dysfunction of thoracic region: Secondary | ICD-10-CM | POA: Diagnosis not present

## 2017-04-07 DIAGNOSIS — M9901 Segmental and somatic dysfunction of cervical region: Secondary | ICD-10-CM | POA: Diagnosis not present

## 2017-04-07 DIAGNOSIS — M9903 Segmental and somatic dysfunction of lumbar region: Secondary | ICD-10-CM | POA: Diagnosis not present

## 2017-04-11 ENCOUNTER — Other Ambulatory Visit: Payer: Self-pay | Admitting: Gastroenterology

## 2017-04-11 DIAGNOSIS — R935 Abnormal findings on diagnostic imaging of other abdominal regions, including retroperitoneum: Secondary | ICD-10-CM

## 2017-04-12 DIAGNOSIS — M9903 Segmental and somatic dysfunction of lumbar region: Secondary | ICD-10-CM | POA: Diagnosis not present

## 2017-04-12 DIAGNOSIS — M9901 Segmental and somatic dysfunction of cervical region: Secondary | ICD-10-CM | POA: Diagnosis not present

## 2017-04-12 DIAGNOSIS — M9902 Segmental and somatic dysfunction of thoracic region: Secondary | ICD-10-CM | POA: Diagnosis not present

## 2017-04-14 ENCOUNTER — Ambulatory Visit
Admission: RE | Admit: 2017-04-14 | Discharge: 2017-04-14 | Disposition: A | Discharge status: Home / Self Care | Payer: PPO | Source: Ambulatory Visit | Attending: Gastroenterology | Admitting: Gastroenterology

## 2017-04-14 ENCOUNTER — Other Ambulatory Visit: Payer: Self-pay | Admitting: Gastroenterology

## 2017-04-14 ENCOUNTER — Other Ambulatory Visit
Admission: RE | Admit: 2017-04-14 | Discharge: 2017-04-14 | Disposition: A | Discharge status: Home / Self Care | Payer: PPO | Source: Ambulatory Visit | Attending: Gastroenterology | Admitting: Gastroenterology

## 2017-04-14 ENCOUNTER — Ambulatory Visit: Payer: PPO

## 2017-04-14 ENCOUNTER — Ambulatory Visit
Admission: RE | Admit: 2017-04-14 | Discharge: 2017-04-14 | Disposition: A | Discharge status: Home / Self Care | Payer: PPO | Source: Ambulatory Visit | Attending: Neurology | Admitting: Neurology

## 2017-04-14 DIAGNOSIS — I6529 Occlusion and stenosis of unspecified carotid artery: Secondary | ICD-10-CM | POA: Diagnosis not present

## 2017-04-14 DIAGNOSIS — K760 Fatty (change of) liver, not elsewhere classified: Secondary | ICD-10-CM | POA: Insufficient documentation

## 2017-04-14 DIAGNOSIS — R51 Headache: Secondary | ICD-10-CM | POA: Diagnosis not present

## 2017-04-14 DIAGNOSIS — I700 Atherosclerosis of aorta: Secondary | ICD-10-CM | POA: Insufficient documentation

## 2017-04-14 DIAGNOSIS — G4459 Other complicated headache syndrome: Secondary | ICD-10-CM | POA: Diagnosis not present

## 2017-04-14 DIAGNOSIS — R935 Abnormal findings on diagnostic imaging of other abdominal regions, including retroperitoneum: Secondary | ICD-10-CM | POA: Diagnosis not present

## 2017-04-14 LAB — CREATININE, SERUM
CREATININE: 1.07 mg/dL (ref 0.61–1.24)
GFR calc Af Amer: >60 mL/min (ref >60)

## 2017-04-14 MED ORDER — IOPAMIDOL (ISOVUE-370) INJECTION 76%
125.0000 mL | Freq: Once | INTRAVENOUS | Status: AC | PRN
  Administered 2017-04-14: 125 mL via INTRAVENOUS

## 2017-04-15 ENCOUNTER — Telehealth: Payer: Self-pay | Admitting: *Deleted

## 2017-04-15 ENCOUNTER — Ambulatory Visit: Admission: RE | Admit: 2017-04-15 | Payer: PPO | Source: Ambulatory Visit

## 2017-04-15 DIAGNOSIS — M9901 Segmental and somatic dysfunction of cervical region: Secondary | ICD-10-CM | POA: Diagnosis not present

## 2017-04-15 DIAGNOSIS — M9902 Segmental and somatic dysfunction of thoracic region: Secondary | ICD-10-CM | POA: Diagnosis not present

## 2017-04-15 DIAGNOSIS — M9903 Segmental and somatic dysfunction of lumbar region: Secondary | ICD-10-CM | POA: Diagnosis not present

## 2017-04-15 NOTE — Telephone Encounter (Signed)
Called and LVM for wife, Cecille Rubin (on [place]) about results per Dr Brett Fairy note. Gave [facility] phone number if she has further questions or concerns.

## 2017-04-15 NOTE — Telephone Encounter (Signed)
-----   Message from Larey Seat, MD sent at 04/14/2017  6:23 PM EDT ----- No acute abnormalities, such as bleed, stroke, etc. Please let patient know that the images were compared to images on file, and there were only age related changes. CD  I send this to Dr Ferdinand Lango attention.

## 2017-04-20 DIAGNOSIS — M9901 Segmental and somatic dysfunction of cervical region: Secondary | ICD-10-CM | POA: Diagnosis not present

## 2017-04-20 DIAGNOSIS — M9902 Segmental and somatic dysfunction of thoracic region: Secondary | ICD-10-CM | POA: Diagnosis not present

## 2017-04-20 DIAGNOSIS — M9903 Segmental and somatic dysfunction of lumbar region: Secondary | ICD-10-CM | POA: Diagnosis not present

## 2017-04-27 ENCOUNTER — Encounter: Payer: Self-pay | Admitting: Urology

## 2017-04-27 ENCOUNTER — Telehealth: Payer: Self-pay | Admitting: Radiology

## 2017-04-27 ENCOUNTER — Ambulatory Visit (INDEPENDENT_AMBULATORY_CARE_PROVIDER_SITE_OTHER): Payer: PPO | Admitting: Neurology

## 2017-04-27 ENCOUNTER — Encounter: Payer: Self-pay | Admitting: Neurology

## 2017-04-27 ENCOUNTER — Other Ambulatory Visit: Payer: Self-pay | Admitting: Radiology

## 2017-04-27 ENCOUNTER — Ambulatory Visit (INDEPENDENT_AMBULATORY_CARE_PROVIDER_SITE_OTHER): Payer: PPO | Admitting: Urology

## 2017-04-27 VITALS — BP 153/63 | HR 82 | Ht 67.0 in | Wt 190.9 lb

## 2017-04-27 DIAGNOSIS — G300 Alzheimer's disease with early onset: Secondary | ICD-10-CM | POA: Diagnosis not present

## 2017-04-27 DIAGNOSIS — R9341 Abnormal radiologic findings on diagnostic imaging of renal pelvis, ureter, or bladder: Secondary | ICD-10-CM

## 2017-04-27 DIAGNOSIS — N2889 Other specified disorders of kidney and ureter: Secondary | ICD-10-CM | POA: Diagnosis not present

## 2017-04-27 DIAGNOSIS — M9903 Segmental and somatic dysfunction of lumbar region: Secondary | ICD-10-CM | POA: Diagnosis not present

## 2017-04-27 DIAGNOSIS — M9902 Segmental and somatic dysfunction of thoracic region: Secondary | ICD-10-CM | POA: Diagnosis not present

## 2017-04-27 DIAGNOSIS — F02818 Dementia in other diseases classified elsewhere, unspecified severity, with other behavioral disturbance: Secondary | ICD-10-CM

## 2017-04-27 DIAGNOSIS — M9901 Segmental and somatic dysfunction of cervical region: Secondary | ICD-10-CM | POA: Diagnosis not present

## 2017-04-27 DIAGNOSIS — F0281 Dementia in other diseases classified elsewhere with behavioral disturbance: Secondary | ICD-10-CM | POA: Diagnosis not present

## 2017-04-27 LAB — URINALYSIS, COMPLETE
BILIRUBIN UA: NEGATIVE
GLUCOSE, UA: NEGATIVE
Ketones, UA: NEGATIVE
Leukocytes, UA: NEGATIVE
Nitrite, UA: NEGATIVE
PH UA: 6 (ref 5.0–7.5)
PROTEIN UA: NEGATIVE
RBC UA: NEGATIVE
SPEC GRAV UA: 1.015 (ref 1.005–1.030)
UUROB: 0.2 mg/dL (ref 0.2–1.0)

## 2017-04-27 MED ORDER — DONEPEZIL HCL 10 MG PO TABS: 10.0000 mg | ORAL_TABLET | Freq: Two times a day (BID) | ORAL | 6 refills | Status: DC

## 2017-04-27 NOTE — Telephone Encounter (Signed)
Pt scheduled for right ureteroscopy, right ureteral biopsy, laser ablation of tumor & right ureteral stent placement with Dr Pilar Jarvis on 05/06/17. Notified pt's wife of surgery date, pre-admit testing appt & to call day prior to surgery for arrival time to [facility]. Wife voices understanding.

## 2017-04-27 NOTE — Progress Notes (Signed)
[facility]    Provider:  Dr Jaynee Eagles Referring Provider: [name], MD Primary Care Physician:  [name], MD  CC:  Rapidly declining cognitive impairment  Interval history 04/27/2017: Patient is here for follow-up.PET scan was completed which showed decreased relative cortical metabolism in the biparietal lobes and temporal lobes is a pattern suggestive of Alzheimer's type pathology. Patient is currently on Aricept 10 mg daily. He has a past medical history of obesity, cognitive decline previously seen by [facility] for years without a diagnosis, seizures on Lamictal, TIA/strokes on aspirin and Plavix, pancreatitis status post stent, hypertension, hyperlipidemia, diabetes, congestive heart failure, coronary artery disease, acute MI, back pain. Patient was last seen May 2018 when Namenda was added. Patient called in August of this year complaining of new onset daily headache for the past 4 years that he was crying with the head pain, we advised him to go to the emergency room but they did not comply and we ordered a CT of the head which showed no acute event. Gabapentin was started at low dose to see if it would help the headaches. They have kidney surgery next week. Headaches have gotten better. They enjoyed disney.   In 01/2015 was started on Keppra due to EEG findings and unknown reason for confusion.  01/23/2015 -Personally interpreted, as below. 3-4 HOUR EEG IMPRESSION: This 3-4 hour EEG in the awake and asleep states demonstrated occasional left temporal epileptiform discharges. Personally reviewed and interpreted.        Labs: Vitamin D was extremely low daily supplementation 2000 international units daily was recommended on 10/21/2016. We had ordered an FDG PET scan which was canceled because patient's wife was in the hospital. PET scan was completed which showed decreased relative cortical metabolism in the biparietal lobes and temporal lobes is a pattern  suggestive of Alzheimer's type pathology.  HPI:  CAMRON MONDAY is a 61 y.o. male here as a referral from Dr. Astrid Divine for cognitive impairment. PMHx obesity, cognitive decline(extensive workup already by [facility] and seeing them for years without a diagnosis of neurodegenerative disease likely delirium and placed on Aricept 5mg ), seizures on Lamictal,TIA/Stroke on ASA and Plavix, Pancreatitis s/p stent, HTN, HLD, Diabetes, CHF, CAD, back pain, acute MI.   He has already had extensive workup at [facility] including LP and csf paraneoplastic panel. [facility] diagnosed as Delirium, multifactorial due to history of seizures, neuroleptic sensitivity and stroke. Previous history of hospital admission for delirium worse with Risperdal, he was started on Aricept 5 mg a day recently by [facility] and urgently referred here by [facility](unclear why [place] [facility] would refer here urgently).   They don't want to go back to [facility]. They were first seen at [facility] neurology for Delirium in the hospital and continued to be followed for cognitive changes that started during that admission in the setting of Ambien use, his wife provides most information. She has an Forensic psychologist. In July of last year he improved to "complete normal". He was doing very well. He has been fine since July and going to speech therapy. 3 weeks ago he declined after Tamiflu and since then he has been "crazy, completely lost, begging to go home" he has seen Dr. Melrose Nakayama since then and diagnosed with Delirium. They tried to have an MRI brain and could not tolerate. CT of the head was unremarkable. This past Sunday he couldn't talk, couldn't eat. Yesterday he could talk and walk. He has improved tremendously since Sunday but still has waxing and waning.  No history of dementia in the family.   Reviewed notes, labs and imaging from outside physicians, which showed:   CT head: personally reviewed imaging and agree with the following:   FINDINGS: Brain: Mild  atrophy with progression from the prior study. Mild chronic microvascular ischemic change.  Negative for acute infarct. Negative for hemorrhage or mass. No shift of the midline structures.  Vascular: No hyperdense vessel. Diffuse arterial calcification.  Skull: Negative  Sinuses/Orbits: Radiopaque density in the right eye lid likely foreign body or calcification. This is similar to the prior CT. No orbital mass.  Paranasal sinuses clear. Mastoid sinus clear.  Other: None  IMPRESSION: Progressive atrophy.  Reviewed primary care notes. Symptoms started in November 2016 when patient was first given Ambien Has been he really started having pro A neurologist from [address] When he is under stress it gets w The symptoms are memory loss and  Patient does not drive. His memory loss is more short-term than long-term.He had an MRI done at [facility] in Dec and January 2016 were both normal.Patient has significant medical history including  Heart attack, stroke. Also saw neurologist at [facility] who stated he was delirious. MRI normal.He was taken off of B12 due to pancreatitis.  He has a sleep disorder and has insomnia. He takes Lamictal for  Seizures. EEG June 2016 was a 5 hour 26 minute EEG was abnormal due to presence of left temporal epileptiform discharges.it appears his mental status and speech  Have improved, he has an unsteady gait improved wi with physical therapy, he was seen by speech and able to communicatewith minimal difficulties, he still has periods of confusion and disorientation b but he is easily reoriented.   Per [facility] neurology: He has been seeing [place] [facility]. Symptoms most consistent with Delirium; Likely multifactorial in a patient with history of seizures, neuroleptic sensitivity, and stroke. But he was started on Aricept 5mg  and urgently referred to [place] from [place] (for unknown reasons since he was seeing [place] [facility]). He has had admissions for Delirium worse  with Risperdal. They recommended continued Lamictal for seizures.  He is on Plavix and ASA or stroke prevention. He takes melatonin for sleep.   [facility] notes: Transient memory loss with expressive aphasia- Likely multifactorial in a patient with history of seizures, neuroleptic sensitivity, and stroke -We discussed CTE -Previous history of hospital admission for delirium worse with Risperdal  -We will obtain MRI Brain with and without contrast -We will make urgent referral to [facility] -We will start Aricept 5 mg once a day. Donepezil is a cholinesterase inhibitor; it works by preventing acetylcholine breakdown in the brain. Most common side effects are nausea, vomiting and diarrhea. Typically starting dose is 5 mg once a day and can increase up to 10 mg once a day, if tolerated. -Recommend continuing working with Physical Therapy for balance and strengthening exercises.  History of stroke -Rec continuing taking Aspirin 81 mg once a day and Plavix 75 mg once a day as secondary stroke prevention -Rec continuing with Atorvastatin 80 mg once a day -Patient should recognize the signs and symptoms of stroke which are subtle and dramatic. Patient advised to call 911 if having any one or more of these signs and symptoms: sudden numbness or weakness of the face or extremities, sudden confusion, trouble speaking, or understanding speech; sudden trouble seeing in one or both eyes; sudden trouble walking; loss of balance or coordination' dizziness; and sudden severe headache with no known cause.  Seizures -  Controlled. No new seizures -Wife states patient hasn't had any new seizures. Taking Lamictal 100 mg twice a day and tolerating without side effects.  Sleep Disorder -Improved -Patient is sleeping about 6 hours - 8 hours per night, per wife. Taking melatonin for sleep -Will continue to monitor.  Prolonged EEG with video 10/2015 [facility]: reviewed report as follows:  Report: This EEG was  acquired with electrodes placed according to  the International 10-20 electrode placement system.. Video was  recorded simultaneously. The entire study was reviewed; video was  reviewed for selected events.  The recording quality was good. There was no clear evidence for  an occipital dominant rhythm. The background consisted  predominantly of theta (6 - 7 Hz) with intermixed (delta (1.5 -  2.5 Hz)activity. Drowsiness and sleep were manifested by  background fragmentation, vertex waves, K-complexes, and sleep  spindles. There was no focal slowing. Interictal discharges  consisting of occassional spike and sharp waves were seen in the  left temporal head region.There were no electrographic seizures  identified.Photic stimulation was not performed.  Hyperventilation was not performed.  Impression: This EEG obtained in the awake and asleep states is  abnormal due to:  1. Occasional spike and sharp waves, left temporal.  2. Mild background slowing.   Clinical Correlation: The above findings are consistent with:  1. An area of neuronal irritability within the left temporal head  region.  2. Mild encephalopathy.   CT head 09/2015: reviewed report:  FINDINGS:  No hemorrhage, extra-axial fluid collection, cerebral edema, acute cortical infarction, mass, mass effect, midline shift. Ventricles and sulci are normal for patient age.  Normal paranasal sinuses, orbits and cranium.  IMPRESSION: No acute intracranial abnormalities.    The preliminary report (critical or emergent communication) was reviewed prior to this dictation and there are no substantial differences between the preliminary results and the impressions in this final report.  HgbA1c 7 09/2016 (was 10 in 02/2016)  CMP 09/23/2016 showed BUN 9, creatinine 0.8.   Ammonia 41 09/23/2016  HIV NR  Tox urine negative 09/2015  TSH nml 09/2015  Unremarkable csf 10/2015. Paraneoplastic antibodies 10/2015  negative csf Review of Systems: Patient complains of symptoms per HPI as well as the following symptoms: No CP, no SOB. Pertinent negatives and positives per HPI. All others negative.   Social History   Social History  . Marital status: Married    Spouse name: N/A  . Number of children: 2  . Years of education: BS   Occupational History  . Disabled    Social History Main Topics  . Smoking status: Former Smoker    Quit date: 2013  . Smokeless tobacco: Never Used     Comment: used to smoke 2PD for 40 yrs, quit about 4 years ago  . Alcohol use No     Comment: occasional  . Drug use: No  . Sexual activity: Not on file   Other Topics Concern  . Not on file   Social History Narrative   Lives at home with wife and son. Ambulatory at baseline.   Right-handed   Caffeine: 2 sodas per day    Family History  Problem Relation Age of Onset  . Diabetes Mother   . Hypertension Mother   . CAD Mother   . Hyperlipidemia Mother   . Stroke Mother   . ALS Mother   . Alzheimer's disease Father   . Diabetes Father   . Heart Problems Brother     Past Medical History:  Diagnosis Date  . Acute MI ([place])   . Arthritis   . Back pain   . CAD (coronary artery disease)   . CHF (congestive heart failure) ([place])   . Degenerative lumbar disc   . Diabetes mellitus without complication ([place])   . GERD (gastroesophageal reflux disease)   . Hernia of abdominal cavity   . Hyperlipemia   . Hypertension   . Malignant intraductal papillary mucinous tumor of pancreas ([place])   . Memory loss   . Pancreatitis   . Seizures ([place])    staring spells  . Shingles   . Stroke ([place])   . TIA (transient ischemic attack)     Past Surgical History:  Procedure Laterality Date  . CARDIAC CATHETERIZATION    . CORONARY ANGIOPLASTY    . ERCP N/A 09/12/2015   Procedure: ENDOSCOPIC RETROGRADE CHOLANGIOPANCREATOGRAPHY (ERCP);  Surgeon: Hulen Luster, MD;  Location[facility] ENDOSCOPY;  Service: Gastroenterology;   Laterality: N/A;  . ERCP N/A 01/02/2016   Procedure: ENDOSCOPIC RETROGRADE CHOLANGIOPANCREATOGRAPHY (ERCP);  Surgeon: Hulen Luster, MD;  Location: [facility] ENDOSCOPY;  Service: Gastroenterology;  Laterality: N/A;  . EXTERNAL FIXATION WRIST FRACTURE Left   . left arm metal plate    . LEFT HEART CATH AND CORONARY ANGIOGRAPHY Left 11/04/2016   Procedure: Left Heart Cath and Coronary Angiography;  Surgeon: Isaias Cowman, MD;  Location: [facility] [facility] CV LAB;  Service: Cardiovascular;  Laterality: Left;  . Left shoulder surgery    . Stent times 2  2013   Cardiac  . TOTAL ELBOW REPLACEMENT Left   . VASECTOMY    . VENTRICULOPERITONEAL SHUNT      Current Outpatient Prescriptions  Medication Sig Dispense Refill  . acetaminophen (TYLENOL) 500 MG tablet Take 500 mg by mouth 3 (three) times daily.    Marland Kitchen aspirin EC 81 MG tablet Take 81 mg by mouth daily.    Marland Kitchen atorvastatin (LIPITOR) 80 MG tablet Take 80 mg by mouth daily.    . cholecalciferol (VITAMIN D) 1000 units tablet Take 1,000 Units by mouth daily.    . clopidogrel (PLAVIX) 75 MG tablet Take 75 mg by mouth daily.     . colestipol (COLESTID) 1 g tablet Take 2 g by mouth daily at 12 noon.    . diphenoxylate-atropine (LOMOTIL) 2.5-0.025 MG tablet Take 1 tablet by mouth 3 (three) times daily as needed for diarrhea or loose stools.    . donepezil (ARICEPT) 10 MG tablet Take 1 tablet (10 mg total) by mouth 2 (two) times daily. 180 tablet 6  . furosemide (LASIX) 40 MG tablet Take 40 mg by mouth at bedtime.    . gabapentin (NEURONTIN) 100 MG capsule Take 200 mg by mouth in the morning and 300 mg by mouth at bedtime. (Patient taking differently: Take 100 mg by mouth 3 (three) times daily as needed (shingle pain). ) 150 capsule 3  . insulin NPH Human (HUMULIN N,NOVOLIN N) 100 UNIT/ML injection Inject 0.25 mLs (25 Units total) into the skin 2 (two) times daily. (Patient taking differently: Inject 30 Units into the skin 2 (two) times daily. ) 10 mL 11  . insulin  regular (NOVOLIN R,HUMULIN R) 100 units/mL injection Inject 0.3 mLs (30 Units total) into the skin 3 (three) times daily with meals. (Patient taking differently: Inject 34 Units into the skin 3 (three) times daily before meals. 34 units subcutaneous 3 times daily with meals with an additional sliding scale of 8 units added with every 20 glucose reading over 200.) 10  mL 1  . isosorbide mononitrate (IMDUR) 30 MG 24 hr tablet Take 30 mg by mouth daily.     Marland Kitchen lamoTRIgine (LAMICTAL) 100 MG tablet Take 100 mg by mouth 2 (two) times daily.    Marland Kitchen lidocaine (LIDODERM) 5 % Place 1 patch onto the skin daily as needed (pain). Remove & Discard patch within 12 hours or as directed by MD (APPLIED TO PATIENT BACK FOR PAIN)    . losartan (COZAAR) 100 MG tablet Take 100 mg by mouth daily.    . memantine (NAMENDA) 10 MG tablet Take 1 tablet (10 mg total) by mouth 2 (two) times daily. 180 tablet 3  . metoprolol succinate (TOPROL-XL) 100 MG 24 hr tablet Take 100 mg by mouth daily. Take with or immediately following a meal.    . naproxen sodium (ANAPROX) 220 MG tablet Take 220 mg by mouth daily as needed (headaches).    . nitroGLYCERIN (NITROSTAT) 0.4 MG SL tablet Place 1 tablet under the tongue every 5 (five) minutes as needed for chest pain.     . Pancrelipase, Lip-Prot-Amyl, (ZENPEP) 25000 units CPEP Take 50,000 Units by mouth 3 (three) times daily with meals.    . pantoprazole (PROTONIX) 40 MG tablet Take 40 mg by mouth at bedtime.     . potassium chloride (K-DUR,KLOR-CON) 10 MEQ tablet Take 10 mEq by mouth 2 (two) times daily.     No current facility-administered medications for this visit.     Allergies as of 04/27/2017 - Review Complete 04/27/2017  Allergen Reaction Noted  . Hydrocodone Anaphylaxis 02/19/2015  . Morphine Other (See Comments) 05/27/2015  . Ambien [zolpidem] Other (See Comments) 02/19/2015  . Brilinta [ticagrelor] Other (See Comments) 02/19/2015  . Flexeril [cyclobenzaprine] Other (See Comments)  12/19/2015  . Flunitrazepam Other (See Comments) 11/02/2016  . Haldol [haloperidol lactate] Other (See Comments) 12/19/2015  . Levetiracetam Diarrhea and Other (See Comments) 05/27/2015  . Lorazepam Hives 05/27/2015  . Risperdal [risperidone] Other (See Comments) 12/19/2015  . Trazodone Other (See Comments) 05/27/2015  . Benadryl [diphenhydramine hcl (sleep)] Rash 04/04/2016  . Penicillins Rash 02/19/2015    Vitals: BP (!) 150/75   Pulse 87   Ht 5\' 7"  (1.702 m)   Wt 194 lb 6.4 oz (88.2 kg)   BMI 30.45 kg/m  Last Weight:  Wt Readings from Last 1 Encounters:  04/27/17 194 lb 6.4 oz (88.2 kg)   Last Height:   Ht Readings from Last 1 Encounters:  04/27/17 5\' 7"  (1.702 m)   Physical exam: Exam: Gen: NAD, conversant, well nourised, obese, well groomed                     CV: RRR, no MRG. No Carotid Bruits. No peripheral edema, warm, nontender Eyes: Conjunctivae clear without exudates or hemorrhage  Neuro: Detailed Neurologic Exam  Speech:    Speech is normal; fluent and spontaneous with normal comprehension.  Cognition:    The patient is oriented to person, place, and time;     recent and remote memory impaired;     language fluent;     Impaired attention, concentration, fund of knowledge Cranial Nerves:    The pupils are equal, round, and reactive to light. Visual fields are full to finger confrontation. Extraocular movements are intact. Trigeminal sensation is intact and the muscles of mastication are normal. The face is symmetric. The palate elevates in the midline. Hearing intact. Voice is normal. Shoulder shrug is normal. The tongue has normal motion without fasciculations.  Motor Observation:    No asymmetry, no atrophy, and no involuntary movements noted. Tone:    Normal muscle tone.    Posture:    Posture is normal. normal erect    Strength:    Strength is V/V in the upper and lower limbs.      Sensation: intact to LT      Assessment/Plan:   Maurice Patterson is a 61 y.o. male here as an urgent referral from [facility] (who has been seeing him for cognitive changes for years with an extensive workup) for cognitive impairment. PMHx cognitive decline(extensive workup already by [facility] and seeing them for years without a diagnosis of neurodegenerative disease likely delirium and placed on Aricept 5mg ), seizures on Lamictal,TIA/Stroke on ASA and Plavix, Pancreatitis s/p stent, HTN, HLD, Diabetes, CHF, CAD, back pain, acute MI.   He has already had extensive workup at [facility] including LP and csf paraneoplastic panel. [facility] diagnosed as Delirium, multifactorial due to history of seizures, neuroleptic sensitivity and stroke. Previous history of hospital admission for delirium worse with Risperdal, he was started on Aricept 5 mg a day recently by [facility] and urgently referred here by [facility](unclear why [place] [facility]y would refer here urgently after seeing him for this condition for years).  - PEt CT c/w Alzheimer's dementia, had a long discussion with patient and wife, extended visit 45 minutes answered questions, prognosis  - I reviewed his extensive history of labs, brain imaging, prolonged EEGs. I am not sure I have much more testing to offer patient. We could provide an FDG PET scan and formal neurocognitive testing. - CT of the head does show progressive atrophy. - He had difficulty following commands today, alert but reduced comprehension, MMSE 17/30 - Can try to get another MRI, he did not tolerate it can try wth open MRI. Rapidly progressing dementia in a fairly young patient 75, need to eval for reversible causes, encephalitis - FDG PET Scan to eval for alzheimers or frontotemporal dementia - Neurocognitive testing after workup above - PT and OT to the home can't leave unattended.due o significant memory issues - Labs today Sarina Ill, MD  [facility] [address] [place], [place] [address]  Phone  [phone] Fax [phone]  A total of 45 minutes was spent face-to-face with this patient. Over half this time was spent on counseling patient on the dementia diagnosis and different diagnostic and therapeutic options available, counseling.

## 2017-04-27 NOTE — Progress Notes (Signed)
04/27/2017 9:21 AM   Lajean Manes May 02, 1956 [id number]  Referring provider: Minus Liberty, PA-C [address] [place], [place] [address]  Chief Complaint  Patient presents with  . New Patient (Initial Visit)    HPI: The patient is a 61 year old gentleman with multiple medical comorbidities who presents today for a filling defect at his right UPJ as well as a 11 mm exophytic lesion in the upper pole of his right kidney that shows low-level enhancement slightly grown since 2016. The CT scan was performed to monitor his abnormal pancreas. He has no prior GU history. He is no history of kidney stones. He does have early Alzheimer's per his wife. He has chronic diarrhea that requires depends. He has no history of hematuria.   PMH: Past Medical History:  Diagnosis Date  . Acute MI ([place])   . Arthritis   . Back pain   . CAD (coronary artery disease)   . CHF (congestive heart failure) ([place])   . Degenerative lumbar disc   . Diabetes mellitus without complication ([place])   . GERD (gastroesophageal reflux disease)   . Hernia of abdominal cavity   . Hyperlipemia   . Hypertension   . Malignant intraductal papillary mucinous tumor of pancreas ([place])   . Memory loss   . Pancreatitis   . Seizures ([place])    staring spells  . Shingles   . Stroke ([place])   . TIA (transient ischemic attack)     Surgical History: Past Surgical History:  Procedure Laterality Date  . CARDIAC CATHETERIZATION    . CORONARY ANGIOPLASTY    . ERCP N/A 09/12/2015   Procedure: ENDOSCOPIC RETROGRADE CHOLANGIOPANCREATOGRAPHY (ERCP);  Surgeon: Hulen Luster, MD;  Location: [facility] ENDOSCOPY;  Service: Gastroenterology;  Laterality: N/A;  . ERCP N/A 01/02/2016   Procedure: ENDOSCOPIC RETROGRADE CHOLANGIOPANCREATOGRAPHY (ERCP);  Surgeon: Hulen Luster, MD;  Location: [facility] ENDOSCOPY;  Service: Gastroenterology;  Laterality: N/A;  . EXTERNAL FIXATION WRIST FRACTURE Left   . left arm metal plate    .  LEFT HEART CATH AND CORONARY ANGIOGRAPHY Left 11/04/2016   Procedure: Left Heart Cath and Coronary Angiography;  Surgeon: Isaias Cowman, MD;  Location: [facility];  Service: Cardiovascular;  Laterality: Left;  . Left shoulder surgery    . Stent times 2  2013   Cardiac  . TOTAL ELBOW REPLACEMENT Left   . VASECTOMY    . VENTRICULOPERITONEAL SHUNT      Home Medications:  Allergies as of 04/27/2017      Reactions   Hydrocodone Anaphylaxis   Morphine Other (See Comments)   Ambien [zolpidem] Other (See Comments)   delirium    Benadryl [diphenhydramine Hcl (sleep)]    Brilinta [ticagrelor] Other (See Comments)   Stroke   Flexeril [cyclobenzaprine] Other (See Comments)   delerium   Flunitrazepam Other (See Comments)   ROHYPNOL (hallucinations)   Haldol [haloperidol Lactate] Other (See Comments)   delerium   Levetiracetam Diarrhea, Other (See Comments)   Unable to walk   Lorazepam Hives   Risperdal [risperidone] Other (See Comments)   Delirium   Trazodone Other (See Comments)   Penicillins Rash   Has patient had a PCN reaction causing immediate rash, facial/tongue/throat swelling, SOB or lightheadedness with hypotension: Yes Has patient had a PCN reaction causing severe rash involving mucus membranes or skin necrosis: No Has patient had a PCN reaction that required hospitalization No Has patient had a PCN reaction occurring within the last 10 years: Yes  If all of the above answers are "NO", then may proceed with Cephalosporin use.      Medication List       Accurate as of 04/27/17  9:21 AM. Always use your most recent med list.          acetaminophen 500 MG tablet Commonly known as:  TYLENOL Take 500 mg by mouth 3 (three) times daily.   ALPRAZolam 0.5 MG tablet Commonly known as:  XANAX Take 1 tablet up to one hour prior to MRI for anxiety, may repeat x 1 if needed.   aspirin EC 81 MG tablet Take 81 mg by mouth daily.   atorvastatin 80 MG tablet Commonly  known as:  LIPITOR Take 80 mg by mouth daily.   cholecalciferol 1000 units tablet Commonly known as:  VITAMIN D Take 1,000 Units by mouth daily.   clopidogrel 75 MG tablet Commonly known as:  PLAVIX Take 75 mg by mouth daily.   diphenoxylate-atropine 2.5-0.025 MG tablet Commonly known as:  LOMOTIL Take 1 tablet by mouth 3 (three) times daily as needed for diarrhea or loose stools.   donepezil 5 MG tablet Commonly known as:  ARICEPT Take 10 mg by mouth daily.   furosemide 40 MG tablet Commonly known as:  LASIX Take 40 mg by mouth at bedtime.   gabapentin 100 MG capsule Commonly known as:  NEURONTIN Take 200 mg by mouth in the morning and 300 mg by mouth at bedtime.   insulin NPH Human 100 UNIT/ML injection Commonly known as:  HUMULIN N,NOVOLIN N Inject 0.25 mLs (25 Units total) into the skin 2 (two) times daily.   insulin regular 100 units/mL injection Commonly known as:  NOVOLIN R,HUMULIN R Inject 0.3 mLs (30 Units total) into the skin 3 (three) times daily with meals.   isosorbide mononitrate 30 MG 24 hr tablet Commonly known as:  IMDUR Take 30 mg by mouth daily.   lamoTRIgine 100 MG tablet Commonly known as:  LAMICTAL Take 100 mg by mouth 2 (two) times daily.   lidocaine 5 % Commonly known as:  LIDODERM Place 1 patch onto the skin daily as needed. Remove & Discard patch within 12 hours or as directed by MD (APPLIED TO PATIENT BACK FOR PAIN)   losartan 100 MG tablet Commonly known as:  COZAAR Take 100 mg by mouth daily.   Melatonin 5 MG Tabs Take 5 mg by mouth at bedtime.   memantine 10 MG tablet Commonly known as:  NAMENDA Take 1 tablet (10 mg total) by mouth 2 (two) times daily.   metoprolol succinate 50 MG 24 hr tablet Commonly known as:  TOPROL-XL Take 50 mg by mouth daily. Take with or immediately following a meal.   nitroGLYCERIN 0.4 MG SL tablet Commonly known as:  NITROSTAT Place 1 tablet under the tongue every 5 (five) minutes as needed for  chest pain.   pantoprazole 40 MG tablet Commonly known as:  PROTONIX Take 40 mg by mouth at bedtime.   potassium chloride 10 MEQ tablet Commonly known as:  K-DUR,KLOR-CON Take 10 mEq by mouth 2 (two) times daily.   QUESTRAN LIGHT 4 GM/DOSE powder Generic drug:  cholestyramine light Take 4 g by mouth daily as needed (for diarrhea).   ZENPEP 25000 units Cpep Generic drug:  Pancrelipase (Lip-Prot-Amyl) Take 50,000 Units by mouth 3 (three) times daily with meals.            Discharge Care Instructions        Start  Ordered   04/27/17 0000  Urine culture     04/27/17 0839   04/27/17 0000  Urinalysis, Complete     04/27/17 0851      Allergies:  Allergies  Allergen Reactions  . Hydrocodone Anaphylaxis  . Morphine Other (See Comments)  . Ambien [Zolpidem] Other (See Comments)    delirium    . Benadryl [Diphenhydramine Hcl (Sleep)]   . Brilinta [Ticagrelor] Other (See Comments)    Stroke   . Flexeril [Cyclobenzaprine] Other (See Comments)    delerium   . Flunitrazepam Other (See Comments)    ROHYPNOL (hallucinations)  . Haldol [Haloperidol Lactate] Other (See Comments)    delerium   . Levetiracetam Diarrhea and Other (See Comments)    Unable to walk  . Lorazepam Hives  . Risperdal [Risperidone] Other (See Comments)    Delirium   . Trazodone Other (See Comments)  . Penicillins Rash    Has patient had a PCN reaction causing immediate rash, facial/tongue/throat swelling, SOB or lightheadedness with hypotension: Yes Has patient had a PCN reaction causing severe rash involving mucus membranes or skin necrosis: No Has patient had a PCN reaction that required hospitalization No Has patient had a PCN reaction occurring within the last 10 years: Yes If all of the above answers are "NO", then may proceed with Cephalosporin use.    Family History: Family History  Problem Relation Age of Onset  . Diabetes Mother   . Hypertension Mother   . CAD Mother   .  Hyperlipidemia Mother   . Stroke Mother   . ALS Mother   . Alzheimer's disease Father   . Diabetes Father   . Heart Problems Brother     Social History:  reports that he quit smoking about 5 years ago. He has never used smokeless tobacco. He reports that he does not drink alcohol or use drugs.  ROS: UROLOGY Frequent Urination?: Yes Hard to postpone urination?: Yes Burning/pain with urination?: Yes Get up at night to urinate?: Yes Leakage of urine?: No Urine stream starts and stops?: Yes Trouble starting stream?: No Do you have to strain to urinate?: No Blood in urine?: No Urinary tract infection?: No Sexually transmitted disease?: No Injury to kidneys or bladder?: No Painful intercourse?: No Weak stream?: No Erection problems?: No Penile pain?: No  Gastrointestinal Nausea?: No Vomiting?: No Indigestion/heartburn?: No Diarrhea?: Yes Constipation?: No  Constitutional Fever: No Night sweats?: No Weight loss?: No Fatigue?: Yes  Skin Skin rash/lesions?: No Itching?: No  Eyes Blurred vision?: Yes Double vision?: No  Ears/Nose/Throat Sore throat?: No Sinus problems?: No  Hematologic/Lymphatic Swollen glands?: No Easy bruising?: No  Cardiovascular Leg swelling?: Yes Chest pain?: No  Respiratory Cough?: No Shortness of breath?: No  Endocrine Excessive thirst?: Yes  Musculoskeletal Back pain?: Yes Joint pain?: Yes  Neurological Headaches?: Yes Dizziness?: No  Psychologic Depression?: No Anxiety?: No  Physical Exam: BP (!) 153/63 (BP Location: Left Arm, Patient Position: Sitting, Cuff Size: Normal)   Pulse 82   Ht 5\' 7"  (1.702 m)   Wt 190 lb 14.4 oz (86.6 kg)   BMI 29.90 kg/m   Constitutional:  Alert and oriented, No acute distress. HEENT: [place] AT, moist mucus membranes.  Trachea midline, no masses. Cardiovascular: No clubbing, cyanosis, or edema. Respiratory: Normal respiratory effort, no increased work of breathing. GI: Abdomen is soft,  nontender, nondistended, no abdominal masses GU: No CVA tenderness.  Skin: No rashes, bruises or suspicious lesions. Lymph: No cervical or inguinal adenopathy. Neurologic: Grossly intact,  no focal deficits, moving all 4 extremities. Psychiatric: Normal mood and affect.  Laboratory Data: Lab Results  Component Value Date   WBC 8.0 10/20/2016   HGB 14.4 10/20/2016   HCT 42.9 10/20/2016   MCV 93 10/20/2016   PLT 221 10/20/2016    Lab Results  Component Value Date   CREATININE 1.07 04/14/2017    No results found for: PSA  No results found for: TESTOSTERONE  Lab Results  Component Value Date   HGBA1C 8.9 (H) 04/04/2016    Urinalysis    Component Value Date/Time   COLORURINE STRAW (A) 04/04/2016 2328   APPEARANCEUR CLEAR (A) 04/04/2016 2328   APPEARANCEUR Clear 12/14/2014 1833   LABSPEC 1.004 (L) 04/04/2016 2328   LABSPEC 1.029 12/14/2014 1833   PHURINE 6.0 04/04/2016 2328   GLUCOSEU 50 (A) 04/04/2016 2328   GLUCOSEU >=500 12/14/2014 1833   HGBUR NEGATIVE 04/04/2016 2328   BILIRUBINUR NEGATIVE 04/04/2016 2328   BILIRUBINUR Negative 12/14/2014 1833   KETONESUR NEGATIVE 04/04/2016 2328   PROTEINUR NEGATIVE 04/04/2016 2328   NITRITE NEGATIVE 04/04/2016 2328   LEUKOCYTESUR NEGATIVE 04/04/2016 2328   LEUKOCYTESUR Negative 12/14/2014 1833    Pertinent Imaging: CT images reviewed as above  Assessment & Plan:   1. Right UPJ filling defect Discussed the patient that this finding is concerning for potential malignancy and requires direct visualization for further workup. We discussed undergoing cystoscopy, right ureteroscopy, ureteral biopsy, possible laser ablation of tumor, and right ureteral stent placement. We discussed that this will determine the source of the filling defect and if any more aggressive surgery is needed. Discussed the risks and benefits of this indications. He understands the risks include but are not limited to bleeding, infection, iatrogenic injury,  need for repeat procedures.  2. 1 cm exophytic upper pole right kidney lesion We discussed that this has grown slightly since 2016. It is by renal mass standards quite small however. For now, we will manage this with active surveillance. The exact schedule for repeat imaging will be based on results of above.  Nickie Retort, MD  [facility] [address] [place], [place] [address] [phone]

## 2017-04-27 NOTE — Patient Instructions (Signed)
Remember to drink plenty of fluid, eat healthy meals and do not skip any meals. Try to eat protein with a every meal and eat a healthy snack such as fruit or nuts in between meals. Try to keep a regular sleep-wake schedule and try to exercise daily, particularly in the form of walking, 20-30 minutes a day, if you can.   As far as your medications are concerned, I would like to suggest: Increase Aricept to 10mg  twice daily, continue Namenda 100m twice daily  I would like to see you back in 3-6 months, sooner if we need to. Please call us with any interim questions, concerns, problems, updates or refill requests.   Our phone number is [phone]. We also have an after hours call service for urgent matters and there is a physician on-call for urgent questions. For any emergencies you know to call 911 or go to the nearest emergency room

## 2017-04-29 ENCOUNTER — Ambulatory Visit: Payer: PPO | Admitting: Neurology

## 2017-04-29 LAB — URINE CULTURE: Organism ID, Bacteria: NO GROWTH

## 2017-05-01 DIAGNOSIS — F0280 Dementia in other diseases classified elsewhere without behavioral disturbance: Secondary | ICD-10-CM | POA: Insufficient documentation

## 2017-05-01 DIAGNOSIS — G309 Alzheimer's disease, unspecified: Secondary | ICD-10-CM

## 2017-05-02 ENCOUNTER — Encounter
Admission: RE | Admit: 2017-05-02 | Discharge: 2017-05-02 | Disposition: A | Discharge status: Home / Self Care | Payer: PPO | Source: Ambulatory Visit | Attending: Urology | Admitting: Urology

## 2017-05-02 DIAGNOSIS — Z794 Long term (current) use of insulin: Secondary | ICD-10-CM | POA: Insufficient documentation

## 2017-05-02 DIAGNOSIS — N2889 Other specified disorders of kidney and ureter: Secondary | ICD-10-CM | POA: Diagnosis not present

## 2017-05-02 DIAGNOSIS — Z7982 Long term (current) use of aspirin: Secondary | ICD-10-CM | POA: Insufficient documentation

## 2017-05-02 DIAGNOSIS — I509 Heart failure, unspecified: Secondary | ICD-10-CM | POA: Insufficient documentation

## 2017-05-02 DIAGNOSIS — Z9889 Other specified postprocedural states: Secondary | ICD-10-CM | POA: Diagnosis not present

## 2017-05-02 DIAGNOSIS — Z8507 Personal history of malignant neoplasm of pancreas: Secondary | ICD-10-CM | POA: Diagnosis not present

## 2017-05-02 DIAGNOSIS — Z79899 Other long term (current) drug therapy: Secondary | ICD-10-CM | POA: Insufficient documentation

## 2017-05-02 DIAGNOSIS — E119 Type 2 diabetes mellitus without complications: Secondary | ICD-10-CM | POA: Insufficient documentation

## 2017-05-02 DIAGNOSIS — Z8673 Personal history of transient ischemic attack (TIA), and cerebral infarction without residual deficits: Secondary | ICD-10-CM | POA: Diagnosis not present

## 2017-05-02 DIAGNOSIS — I2510 Atherosclerotic heart disease of native coronary artery without angina pectoris: Secondary | ICD-10-CM | POA: Insufficient documentation

## 2017-05-02 DIAGNOSIS — I110 Hypertensive heart disease with heart failure: Secondary | ICD-10-CM | POA: Diagnosis not present

## 2017-05-02 DIAGNOSIS — Z01812 Encounter for preprocedural laboratory examination: Secondary | ICD-10-CM | POA: Insufficient documentation

## 2017-05-02 HISTORY — DX: Chronic kidney disease, unspecified: N18.9

## 2017-05-02 LAB — BASIC METABOLIC PANEL
Anion gap: 8 (ref 5–15)
BUN: 11 mg/dL (ref 6–20)
CHLORIDE: 106 mmol/L (ref 101–111)
CO2: 29 mmol/L (ref 22–32)
Calcium: 8.8 mg/dL — ABNORMAL LOW (ref 8.9–10.3)
Creatinine, Ser: 0.72 mg/dL (ref 0.61–1.24)
GFR calc Af Amer: >60 mL/min (ref >60)
GFR calc non Af Amer: >60 mL/min (ref >60)
GLUCOSE: 138 mg/dL — AB (ref 65–99)
POTASSIUM: 3.3 mmol/L — AB (ref 3.5–5.1)
Sodium: 143 mmol/L (ref 135–145)

## 2017-05-02 NOTE — Pre-Procedure Instructions (Signed)
Abnormal lab- potassium 3.3 result fax to office and spoke to Amy.

## 2017-05-02 NOTE — Patient Instructions (Addendum)
Your procedure is scheduled on: May 06, 2017 FRIDAY Report to Same Day Surgery on the 2nd floor in the [facility]. To find out your arrival time, please call [phone] between 1PM - 3PM on: Thursday May 05, 2017  REMEMBER: Instructions that are not followed completely may result in serious medical risk up to and including death; or upon the discretion of your surgeon and anesthesiologist your surgery may need to be rescheduled.  Do not eat food or drink liquids after midnight. No gum chewing or hard candies.  You may however, drink CLEAR liquids up to 2 hours before you are scheduled to arrive at the hospital for your procedure.  Do not drink clear liquids within 2 hours of your scheduled arrival to the hospital as this may lead to your procedure being delayed or rescheduled.  Clear liquids include: - water  DO NOT drink anything not on this list.  Type 1 and Type 2 diabetics should only drink water.  No Alcohol for 24 hours before or after surgery.  No Smoking for 24 hours prior to surgery.  Notify your doctor if there is any change in your medical condition (cold, fever, infection).  Do not wear jewelry, make-up, hairpins, clips or nail polish.  Do not wear lotions, powders, or perfumes.   Do not shave 48 hours prior to surgery. Men may shave face and neck.  Contacts and dentures may not be worn into surgery.  Do not bring valuables to the hospital. [facility] is not responsible for any belongings or valuables.   TAKE THESE MEDICATIONS THE MORNING OF SURGERY WITH A SIP OF WATER: PROTONIX TAKE DOSE NIGHT BEFORE AND MORE OF SURGERY ATORVASTATIN ARICEPT LAMICTAL MEMANTINE METOPROLOL ISOSORBIDE GABAPENTIN IF NEEDED       Take 1/2 of usual insulin dose the night before surgery and none on the morning of surgery.  Follow recommendations from Cardiologist, Pulmonologist or PCP regarding stopping Aspirin, Coumadin, Plavix, Eliquis, Pradaxa, or Pletal.  PER MD INSTRUCTION- NONE UNTIL AFTER SURGERY  Stop Anti-inflammatories such as Advil, Aleve, Ibuprofen, Motrin, Naproxen, Naprosyn, Goodie powder, or aspirin products. (May take Tylenol or Acetaminophen and Celebrex if needed.)  Stop supplements until after surgery. (May continue Vitamin D, Vitamin B, and multivitamin.)  If you are being admitted to the hospital overnight, leave your suitcase in the car. After surgery it may be brought to your room.  If you are being discharged the day of surgery, you will not be allowed to drive home. You will need someone to drive you home and stay with you that night.   If you are taking public transportation, you will need to have a responsible adult to with you.  Please call the number above if you have any questions about these instructions.

## 2017-05-03 DIAGNOSIS — M9902 Segmental and somatic dysfunction of thoracic region: Secondary | ICD-10-CM | POA: Diagnosis not present

## 2017-05-03 DIAGNOSIS — M9901 Segmental and somatic dysfunction of cervical region: Secondary | ICD-10-CM | POA: Diagnosis not present

## 2017-05-03 DIAGNOSIS — M9903 Segmental and somatic dysfunction of lumbar region: Secondary | ICD-10-CM | POA: Diagnosis not present

## 2017-05-13 ENCOUNTER — Encounter: Payer: Self-pay | Admitting: Anesthesiology

## 2017-05-13 ENCOUNTER — Ambulatory Visit: Payer: PPO | Admitting: Anesthesiology

## 2017-05-13 ENCOUNTER — Encounter
Admission: RE | Disposition: A | Discharge status: Home / Self Care | Payer: Self-pay | Source: Ambulatory Visit | Attending: Urology

## 2017-05-13 ENCOUNTER — Ambulatory Visit
Admission: RE | Admit: 2017-05-13 | Discharge: 2017-05-13 | Disposition: A | Discharge status: Home / Self Care | Payer: PPO | Source: Ambulatory Visit | Attending: Urology | Admitting: Urology

## 2017-05-13 ENCOUNTER — Other Ambulatory Visit: Payer: PPO

## 2017-05-13 DIAGNOSIS — I2510 Atherosclerotic heart disease of native coronary artery without angina pectoris: Secondary | ICD-10-CM | POA: Insufficient documentation

## 2017-05-13 DIAGNOSIS — R569 Unspecified convulsions: Secondary | ICD-10-CM | POA: Diagnosis not present

## 2017-05-13 DIAGNOSIS — Z955 Presence of coronary angioplasty implant and graft: Secondary | ICD-10-CM | POA: Diagnosis not present

## 2017-05-13 DIAGNOSIS — Z8673 Personal history of transient ischemic attack (TIA), and cerebral infarction without residual deficits: Secondary | ICD-10-CM | POA: Diagnosis not present

## 2017-05-13 DIAGNOSIS — I252 Old myocardial infarction: Secondary | ICD-10-CM | POA: Insufficient documentation

## 2017-05-13 DIAGNOSIS — M9901 Segmental and somatic dysfunction of cervical region: Secondary | ICD-10-CM | POA: Diagnosis not present

## 2017-05-13 DIAGNOSIS — Z7902 Long term (current) use of antithrombotics/antiplatelets: Secondary | ICD-10-CM | POA: Diagnosis not present

## 2017-05-13 DIAGNOSIS — Z982 Presence of cerebrospinal fluid drainage device: Secondary | ICD-10-CM | POA: Diagnosis not present

## 2017-05-13 DIAGNOSIS — Z8507 Personal history of malignant neoplasm of pancreas: Secondary | ICD-10-CM | POA: Diagnosis not present

## 2017-05-13 DIAGNOSIS — Z87891 Personal history of nicotine dependence: Secondary | ICD-10-CM | POA: Diagnosis not present

## 2017-05-13 DIAGNOSIS — R413 Other amnesia: Secondary | ICD-10-CM | POA: Insufficient documentation

## 2017-05-13 DIAGNOSIS — I509 Heart failure, unspecified: Secondary | ICD-10-CM | POA: Insufficient documentation

## 2017-05-13 DIAGNOSIS — K219 Gastro-esophageal reflux disease without esophagitis: Secondary | ICD-10-CM | POA: Diagnosis not present

## 2017-05-13 DIAGNOSIS — N135 Crossing vessel and stricture of ureter without hydronephrosis: Secondary | ICD-10-CM | POA: Diagnosis not present

## 2017-05-13 DIAGNOSIS — Z791 Long term (current) use of non-steroidal anti-inflammatories (NSAID): Secondary | ICD-10-CM | POA: Insufficient documentation

## 2017-05-13 DIAGNOSIS — N289 Disorder of kidney and ureter, unspecified: Secondary | ICD-10-CM | POA: Insufficient documentation

## 2017-05-13 DIAGNOSIS — I110 Hypertensive heart disease with heart failure: Secondary | ICD-10-CM | POA: Diagnosis not present

## 2017-05-13 DIAGNOSIS — Z794 Long term (current) use of insulin: Secondary | ICD-10-CM | POA: Insufficient documentation

## 2017-05-13 DIAGNOSIS — Z96622 Presence of left artificial elbow joint: Secondary | ICD-10-CM | POA: Diagnosis not present

## 2017-05-13 DIAGNOSIS — E785 Hyperlipidemia, unspecified: Secondary | ICD-10-CM | POA: Insufficient documentation

## 2017-05-13 DIAGNOSIS — Z7982 Long term (current) use of aspirin: Secondary | ICD-10-CM | POA: Diagnosis not present

## 2017-05-13 DIAGNOSIS — Q6211 Congenital occlusion of ureteropelvic junction: Secondary | ICD-10-CM

## 2017-05-13 DIAGNOSIS — M9902 Segmental and somatic dysfunction of thoracic region: Secondary | ICD-10-CM | POA: Diagnosis not present

## 2017-05-13 DIAGNOSIS — Z79899 Other long term (current) drug therapy: Secondary | ICD-10-CM | POA: Diagnosis not present

## 2017-05-13 DIAGNOSIS — E119 Type 2 diabetes mellitus without complications: Secondary | ICD-10-CM | POA: Diagnosis not present

## 2017-05-13 DIAGNOSIS — N2889 Other specified disorders of kidney and ureter: Secondary | ICD-10-CM | POA: Diagnosis not present

## 2017-05-13 DIAGNOSIS — M9903 Segmental and somatic dysfunction of lumbar region: Secondary | ICD-10-CM | POA: Diagnosis not present

## 2017-05-13 DIAGNOSIS — R9341 Abnormal radiologic findings on diagnostic imaging of renal pelvis, ureter, or bladder: Secondary | ICD-10-CM

## 2017-05-13 DIAGNOSIS — Q6239 Other obstructive defects of renal pelvis and ureter: Secondary | ICD-10-CM

## 2017-05-13 HISTORY — PX: CYSTOSCOPY WITH STENT PLACEMENT: SHX5790

## 2017-05-13 LAB — POCT I-STAT 4, (NA,K, GLUC, HGB,HCT)
Glucose, Bld: 168 mg/dL — ABNORMAL HIGH (ref 65–99)
HCT: 42 % (ref 39.0–52.0)
HEMOGLOBIN: 14.3 g/dL (ref 13.0–17.0)
Potassium: 3.2 mmol/L — ABNORMAL LOW (ref 3.5–5.1)
Sodium: 141 mmol/L (ref 135–145)

## 2017-05-13 LAB — GLUCOSE, CAPILLARY
GLUCOSE-CAPILLARY: 207 mg/dL — AB (ref 65–99)
Glucose-Capillary: 153 mg/dL — ABNORMAL HIGH (ref 65–99)

## 2017-05-13 SURGERY — CYSTOSCOPY, WITH STENT INSERTION
Anesthesia: General | Site: Ureter | Laterality: Right | Wound class: Clean

## 2017-05-13 MED ORDER — FENTANYL CITRATE (PF) 100 MCG/2ML IJ SOLN
INTRAMUSCULAR | Status: DC | PRN
  Administered 2017-05-13: 50 ug via INTRAVENOUS

## 2017-05-13 MED ORDER — FENTANYL CITRATE (PF) 100 MCG/2ML IJ SOLN: 25.0000 ug | INTRAMUSCULAR | Status: DC | PRN

## 2017-05-13 MED ORDER — ONDANSETRON HCL 4 MG/2ML IJ SOLN: 4.0000 mg | Freq: Once | INTRAMUSCULAR | Status: DC | PRN

## 2017-05-13 MED ORDER — CIPROFLOXACIN HCL 500 MG PO TABS: 500.0000 mg | ORAL_TABLET | Freq: Two times a day (BID) | ORAL | 0 refills | Status: DC

## 2017-05-13 MED ORDER — PROPOFOL 10 MG/ML IV BOLUS
INTRAVENOUS | Status: DC | PRN
  Administered 2017-05-13: 50 mg via INTRAVENOUS
  Administered 2017-05-13: 100 mg via INTRAVENOUS

## 2017-05-13 MED ORDER — ONDANSETRON HCL 4 MG/2ML IJ SOLN
INTRAMUSCULAR | Status: DC | PRN
  Administered 2017-05-13: 4 mg via INTRAVENOUS

## 2017-05-13 MED ORDER — CIPROFLOXACIN IN D5W 400 MG/200ML IV SOLN
INTRAVENOUS | Status: AC
Start: 2017-05-13 — End: 2017-05-13
  Filled 2017-05-13: qty 200

## 2017-05-13 MED ORDER — KETOROLAC TROMETHAMINE 10 MG PO TABS: 10.0000 mg | ORAL_TABLET | Freq: Four times a day (QID) | ORAL | 0 refills | Status: DC | PRN

## 2017-05-13 MED ORDER — FENTANYL CITRATE (PF) 100 MCG/2ML IJ SOLN
INTRAMUSCULAR | Status: AC
  Filled 2017-05-13: qty 2

## 2017-05-13 MED ORDER — SODIUM CHLORIDE 0.9 % IV SOLN
INTRAVENOUS | Status: DC
  Administered 2017-05-13: 08:00:00 via INTRAVENOUS

## 2017-05-13 MED ORDER — MIRABEGRON ER 25 MG PO TB24
25.0000 mg | ORAL_TABLET | ORAL | Status: AC
  Administered 2017-05-13: 25 mg via ORAL
  Filled 2017-05-13: qty 1

## 2017-05-13 MED ORDER — PHENYLEPHRINE HCL 10 MG/ML IJ SOLN
INTRAMUSCULAR | Status: DC | PRN
  Administered 2017-05-13 (×3): 100 ug via INTRAVENOUS

## 2017-05-13 MED ORDER — LIDOCAINE HCL (CARDIAC) 20 MG/ML IV SOLN
INTRAVENOUS | Status: DC | PRN
  Administered 2017-05-13: 100 mg via INTRAVENOUS

## 2017-05-13 MED ORDER — KETOROLAC TROMETHAMINE 10 MG PO TABS
10.0000 mg | ORAL_TABLET | Freq: Four times a day (QID) | ORAL | Status: DC | PRN
  Administered 2017-05-13: 10 mg via ORAL
  Filled 2017-05-13 (×3): qty 1

## 2017-05-13 MED ORDER — MIDAZOLAM HCL 2 MG/2ML IJ SOLN
INTRAMUSCULAR | Status: AC
  Filled 2017-05-13: qty 2

## 2017-05-13 MED ORDER — CIPROFLOXACIN IN D5W 400 MG/200ML IV SOLN
400.0000 mg | INTRAVENOUS | Status: AC
  Administered 2017-05-13: 400 mg via INTRAVENOUS

## 2017-05-13 MED ORDER — PROPOFOL 10 MG/ML IV BOLUS
INTRAVENOUS | Status: AC
  Filled 2017-05-13: qty 80

## 2017-05-13 MED ORDER — IOTHALAMATE MEGLUMINE 43 % IV SOLN
INTRAVENOUS | Status: DC | PRN
  Administered 2017-05-13: 5 mL

## 2017-05-13 SURGICAL SUPPLY — 33 items
BACTOSHIELD CHG 4% 4OZ (MISCELLANEOUS) ×2
BASKET ZERO TIP 1.9FR (BASKET) IMPLANT
CATH URETL 5X70 OPEN END (CATHETERS) ×4 IMPLANT
CNTNR SPEC 2.5X3XGRAD LEK (MISCELLANEOUS) ×2
CONT SPEC 4OZ STER OR WHT (MISCELLANEOUS) ×2
CONTAINER SPEC 2.5X3XGRAD LEK (MISCELLANEOUS) ×2 IMPLANT
FIBER LASER LITHO 273 (Laser) IMPLANT
FORCEPS BIOP PIRANHA Y (CUTTING FORCEPS) ×4 IMPLANT
GLOVE BIO SURGEON STRL SZ7 (GLOVE) ×8 IMPLANT
GLOVE BIO SURGEON STRL SZ7.5 (GLOVE) ×4 IMPLANT
GOWN L4 LG 24 PK N/S (GOWN DISPOSABLE) ×4 IMPLANT
GOWN STRL REUS W/ TWL LRG LVL4 (GOWN DISPOSABLE) ×2 IMPLANT
GOWN STRL REUS W/ TWL XL LVL3 (GOWN DISPOSABLE) ×2 IMPLANT
GOWN STRL REUS W/TWL LRG LVL4 (GOWN DISPOSABLE) ×2
GOWN STRL REUS W/TWL XL LVL3 (GOWN DISPOSABLE) ×2
GOWN STRL REUS W/TWL XL LVL4 (GOWN DISPOSABLE) ×4 IMPLANT
GUIDEWIRE SUPER STIFF (WIRE) IMPLANT
INTRODUCER DILATOR DOUBLE (INTRODUCER) ×8 IMPLANT
KIT RM TURNOVER CYSTO AR (KITS) ×4 IMPLANT
PACK CYSTO AR (MISCELLANEOUS) ×4 IMPLANT
SCRUB CHG 4% DYNA-HEX 4OZ (MISCELLANEOUS) ×2 IMPLANT
SENSORWIRE 0.038 NOT ANGLED (WIRE) ×8
SET CYSTO W/LG BORE CLAMP LF (SET/KITS/TRAYS/PACK) ×4 IMPLANT
SHEATH URETERAL 12FR 45CM (SHEATH) ×4 IMPLANT
SHEATH URETERAL 13/15X36 1L (SHEATH) IMPLANT
SOL .9 NS 3000ML IRR  AL (IV SOLUTION) ×2
SOL .9 NS 3000ML IRR UROMATIC (IV SOLUTION) ×2 IMPLANT
STENT URET 6FRX24 CONTOUR (STENTS) IMPLANT
STENT URET 6FRX26 CONTOUR (STENTS) ×4 IMPLANT
SURGILUBE 2OZ TUBE FLIPTOP (MISCELLANEOUS) ×4 IMPLANT
SYRINGE IRR TOOMEY STRL 70CC (SYRINGE) ×4 IMPLANT
WATER STERILE IRR 1000ML POUR (IV SOLUTION) ×4 IMPLANT
WIRE SENSOR 0.038 NOT ANGLED (WIRE) ×4 IMPLANT

## 2017-05-13 NOTE — Discharge Instructions (Signed)
AMBULATORY SURGERY  DISCHARGE INSTRUCTIONS   1) The drugs that you were given will stay in your system until tomorrow so for the next 24 hours you should not:  A) Drive an automobile B) Make any legal decisions C) Drink any alcoholic beverage   2) You may resume regular meals tomorrow.  Today it is better to start with liquids and gradually work up to solid foods.  You may eat anything you prefer, but it is better to start with liquids, then soup and crackers, and gradually work up to solid foods.   3) Please notify your doctor immediately if you have any unusual bleeding, trouble breathing, redness and pain at the surgery site, drainage, fever, or pain not relieved by medication.    4) Additional Instructions:DRINK DRINK DRINK  And then drink some more !!  Your urine will be bloody for a few days as well as some difficulty urinating or pain, urgency, frequency.  This may continue until stent is removed and your drinking is back to normal.     Please contact your physician with any problems or Same Day Surgery at [phone], Monday through Friday 6 am to 4 pm, or [facility] number at [phone].

## 2017-05-13 NOTE — Anesthesia Preprocedure Evaluation (Addendum)
Anesthesia Evaluation  Patient identified by MRN, date of birth, ID band Patient awake    Reviewed: Allergy & Precautions, NPO status , Patient's Chart, lab work & pertinent test results, reviewed documented beta blocker date and time   Airway Mallampati: III  TM Distance: >3 FB     Dental  (+) Edentulous Upper, Edentulous Lower   Pulmonary pneumonia, resolved, former smoker,           Cardiovascular hypertension, Pt. on medications + CAD, + Past MI and +CHF       Neuro/Psych Seizures -,  Anxiety TIACVA    GI/Hepatic GERD  ,  Endo/Other  diabetes, Type 2  Renal/GU Renal disease     Musculoskeletal   Abdominal   Peds  Hematology   Anesthesia Other Findings   Reproductive/Obstetrics                            Anesthesia Physical Anesthesia Plan  ASA: III  Anesthesia Plan: General   Post-op Pain Management:    Induction: Intravenous  PONV Risk Score and Plan:   Airway Management Planned: LMA  Additional Equipment:   Intra-op Plan:   Post-operative Plan:   Informed Consent: I have reviewed the patients History and Physical, chart, labs and discussed the procedure including the risks, benefits and alternatives for the proposed anesthesia with the patient or authorized representative who has indicated his/her understanding and acceptance.     Plan Discussed with: CRNA  Anesthesia Plan Comments:         Anesthesia Quick Evaluation

## 2017-05-13 NOTE — Anesthesia Postprocedure Evaluation (Signed)
Anesthesia Post Note  Patient: Maurice Patterson  Procedure(s) Performed: Procedure(s) (LRB): CYSTOSCOPY WITH STENT PLACEMENT (Right)  Patient location during evaluation: PACU Anesthesia Type: General Level of consciousness: awake and alert Pain management: pain level controlled Vital Signs Assessment: post-procedure vital signs reviewed and stable Respiratory status: spontaneous breathing, nonlabored ventilation, respiratory function stable and patient connected to nasal cannula oxygen Cardiovascular status: blood pressure returned to baseline and stable Postop Assessment: no apparent nausea or vomiting Anesthetic complications: no     Last Vitals:  Vitals:   05/13/17 1017 05/13/17 1107  BP: (!) 171/83   Pulse: 76 72  Resp: 15 16  Temp: (!) 36.3 C   SpO2: 98% 98%    Last Pain:  Vitals:   05/13/17 1107  TempSrc:   PainSc: [place]

## 2017-05-13 NOTE — Progress Notes (Signed)
Dr. Marcello Moores notified of pt CBG: 207. Acknowledged. No new orders. Rankin Coolman E 10:05 AM 05/13/2017

## 2017-05-13 NOTE — Anesthesia Procedure Notes (Signed)
Procedure Name: LMA Insertion Date/Time: 05/13/2017 9:02 AM Performed by: Leander Rams Pre-anesthesia Checklist: Patient identified, Emergency Drugs available, Suction available, Patient being monitored and Timeout performed Patient Re-evaluated:Patient Re-evaluated prior to induction Oxygen Delivery Method: Circle system utilized Preoxygenation: Pre-oxygenation with 100% oxygen Induction Type: IV induction LMA: LMA inserted LMA Size: 4.5 Tube secured with: Tape

## 2017-05-13 NOTE — OR Nursing (Signed)
Patient given toradol for pressure from stent.  Able to void in frequent small amounts. Up to bathroom to void also.

## 2017-05-13 NOTE — Op Note (Signed)
Date of procedure: 05/13/17  Preoperative diagnosis:  1. Right ureteral filling defect   Postoperative diagnosis:  1. Right UPJ obstruction   Procedure: 1. Cystoscopy 2. Right ureteroscopy 3. Right retrograde pyelogram with interpretation 4. Right ureteral stent placement 6 French by 26 cm  Surgeon: Baruch Gouty, MD  Anesthesia: General  Complications: None  Intraoperative findings: The patient had no masses within his ureter or renal collecting system. He did have a right UPJ obstruction which likely accounts for his CT findings. Right retrograde pyelogram did show this UPJ obstruction. There were no other filling defects.  EBL: None  Specimens: None  Drains: 6 French by 26 cm right double-J ureteral stent  Disposition: Stable to the postanesthesia care unit  Indication for procedure: The patient is a 61 y.o. male with history of a possible right renal pelvis filling defect seen on CT scan performed to evaluate his pancreas. He presents today for direct visualization of his right collecting system.  After reviewing the management options for treatment, the patient elected to proceed with the above surgical procedure(s). We have discussed the potential benefits and risks of the procedure, side effects of the proposed treatment, the likelihood of the patient achieving the goals of the procedure, and any potential problems that might occur during the procedure or recuperation. Informed consent has been obtained.  Description of procedure: The patient was met in the preoperative area. All risks, benefits, and indications of the procedure were described in great detail. The patient consented to the procedure. Preoperative antibiotics were given. The patient was taken to the operative theater. General anesthesia was induced per the anesthesia service. The patient was then placed in the dorsal lithotomy position and prepped and draped in the usual sterile fashion. A preoperative timeout  was called.   A 21 French 30 cystoscope was inserted into the patient's bladder per urethra atraumatically. With the aid of a dual-lumen catheter 2 sensor wires were advanced the level of the right renal pelvis under fluoroscopy. Over one of the sensor wires a ureteral access sheath was advanced atraumatically under fluoroscopy. A flexible ureteroscope was inserted into the patient's upper tract via the access sheath. Pan nephroscopy revealed no masses or tumors. There is no stones. Complete unremarkable. His proximal ureter was also unremarkable. Right retrograde pyelogram was obtained which showed no filling defects. Additional however show a right UPJ obstruction with retention of contrast. An nephrostomy was then again performed under fluoroscopic guidance to insure every calyceal was visualized. Again there is no masses or tumors. The ureteral access is then removed under direct visualization which showed no trauma or tumors. The cystoscope was then reassembled over the remaining sensor wire and a 6 [place] by 26 cm double-J stent was placed. Sensor wires removed. A curl seen in the right renal pelvis on the fluoroscopy and in the urinary bladder under direct visualization. The patient's bladder was drained. He was then transferred in stable condition to the postanesthesia care unit.   Plan: The patient will follow-up in one week. We'll plan to tentatively remove his ureteral stent at that time. His wife did mention postoperatively that he has been experiencing right back pain for a number years without a known etiology. We will see if his pain does improve swith drainage of his upper right collecting system with this stent.  Baruch Gouty, M.D.

## 2017-05-13 NOTE — OR Nursing (Signed)
Dr Marcello Moores notified of K+3.2

## 2017-05-13 NOTE — H&P (View-Only) (Signed)
04/27/2017 9:21 AM   Maurice Patterson [birth year] [id number]  Referring provider: Minus Liberty, PA-C [place], [place] [address]  Chief Complaint  Patient presents with  . New Patient (Initial Visit)    HPI: The patient is a 61 year old gentleman with multiple medical comorbidities who presents today for a filling defect at his right UPJ as well as a 11 mm exophytic lesion in the upper pole of his right kidney that shows low-level enhancement slightly grown since 2016. The CT scan was performed to monitor his abnormal pancreas. He has no prior GU history. He is no history of kidney stones. He does have early Alzheimer's per his wife. He has chronic diarrhea that requires depends. He has no history of hematuria.   PMH: Past Medical History:  Diagnosis Date  . Acute MI ([place])   . Arthritis   . Back pain   . CAD (coronary artery disease)   . CHF (congestive heart failure) ([place])   . Degenerative lumbar disc   . Diabetes mellitus without complication ([place])   . GERD (gastroesophageal reflux disease)   . Hernia of abdominal cavity   . Hyperlipemia   . Hypertension   . Malignant intraductal papillary mucinous tumor of pancreas ([place])   . Memory loss   . Pancreatitis   . Seizures ([place])    staring spells  . Shingles   . Stroke ([place])   . TIA (transient ischemic attack)     Surgical History: Past Surgical History:  Procedure Laterality Date  . CARDIAC CATHETERIZATION    . CORONARY ANGIOPLASTY    . ERCP N/A 09/12/2015   Procedure: ENDOSCOPIC RETROGRADE CHOLANGIOPANCREATOGRAPHY (ERCP);  Surgeon: Hulen Luster, MD;  Location: [facility] ENDOSCOPY;  Service: Gastroenterology;  Laterality: N/A;  . ERCP N/A 01/02/2016   Procedure: ENDOSCOPIC RETROGRADE CHOLANGIOPANCREATOGRAPHY (ERCP);  Surgeon: Hulen Luster, MD;  Location: [facility] ENDOSCOPY;  Service: Gastroenterology;  Laterality: N/A;  . EXTERNAL FIXATION WRIST FRACTURE Left   . left arm metal plate    .  LEFT HEART CATH AND CORONARY ANGIOGRAPHY Left 11/04/2016   Procedure: Left Heart Cath and Coronary Angiography;  Surgeon: Isaias Cowman, MD;  Location: [place] CV LAB;  Service: Cardiovascular;  Laterality: Left;  . Left shoulder surgery    . Stent times 2  2013   Cardiac  . TOTAL ELBOW REPLACEMENT Left   . VASECTOMY    . VENTRICULOPERITONEAL SHUNT      Home Medications:  Allergies as of 04/27/2017      Reactions   Hydrocodone Anaphylaxis   Morphine Other (See Comments)   Ambien [zolpidem] Other (See Comments)   delirium    Benadryl [diphenhydramine Hcl (sleep)]    Brilinta [ticagrelor] Other (See Comments)   Stroke   Flexeril [cyclobenzaprine] Other (See Comments)   delerium   Flunitrazepam Other (See Comments)   ROHYPNOL (hallucinations)   Haldol [haloperidol Lactate] Other (See Comments)   delerium   Levetiracetam Diarrhea, Other (See Comments)   Unable to walk   Lorazepam Hives   Risperdal [risperidone] Other (See Comments)   Delirium   Trazodone Other (See Comments)   Penicillins Rash   Has patient had a PCN reaction causing immediate rash, facial/tongue/throat swelling, SOB or lightheadedness with hypotension: Yes Has patient had a PCN reaction causing severe rash involving mucus membranes or skin necrosis: No Has patient had a PCN reaction that required hospitalization No Has patient had a PCN reaction occurring within the last 10 years: Yes  If all of the above answers are "NO", then may proceed with Cephalosporin use.      Medication List       Accurate as of 04/27/17  9:21 AM. Always use your most recent med list.          acetaminophen 500 MG tablet Commonly known as:  TYLENOL Take 500 mg by mouth 3 (three) times daily.   ALPRAZolam 0.5 MG tablet Commonly known as:  XANAX Take 1 tablet up to one hour prior to MRI for anxiety, may repeat x 1 if needed.   aspirin EC 81 MG tablet Take 81 mg by mouth daily.   atorvastatin 80 MG tablet Commonly  known as:  LIPITOR Take 80 mg by mouth daily.   cholecalciferol 1000 units tablet Commonly known as:  VITAMIN D Take 1,000 Units by mouth daily.   clopidogrel 75 MG tablet Commonly known as:  PLAVIX Take 75 mg by mouth daily.   diphenoxylate-atropine 2.5-0.025 MG tablet Commonly known as:  LOMOTIL Take 1 tablet by mouth 3 (three) times daily as needed for diarrhea or loose stools.   donepezil 5 MG tablet Commonly known as:  ARICEPT Take 10 mg by mouth daily.   furosemide 40 MG tablet Commonly known as:  LASIX Take 40 mg by mouth at bedtime.   gabapentin 100 MG capsule Commonly known as:  NEURONTIN Take 200 mg by mouth in the morning and 300 mg by mouth at bedtime.   insulin NPH Human 100 UNIT/ML injection Commonly known as:  HUMULIN N,NOVOLIN N Inject 0.25 mLs (25 Units total) into the skin 2 (two) times daily.   insulin regular 100 units/mL injection Commonly known as:  NOVOLIN R,HUMULIN R Inject 0.3 mLs (30 Units total) into the skin 3 (three) times daily with meals.   isosorbide mononitrate 30 MG 24 hr tablet Commonly known as:  IMDUR Take 30 mg by mouth daily.   lamoTRIgine 100 MG tablet Commonly known as:  LAMICTAL Take 100 mg by mouth 2 (two) times daily.   lidocaine 5 % Commonly known as:  LIDODERM Place 1 patch onto the skin daily as needed. Remove & Discard patch within 12 hours or as directed by MD (APPLIED TO PATIENT BACK FOR PAIN)   losartan 100 MG tablet Commonly known as:  COZAAR Take 100 mg by mouth daily.   Melatonin 5 MG Tabs Take 5 mg by mouth at bedtime.   memantine 10 MG tablet Commonly known as:  NAMENDA Take 1 tablet (10 mg total) by mouth 2 (two) times daily.   metoprolol succinate 50 MG 24 hr tablet Commonly known as:  TOPROL-XL Take 50 mg by mouth daily. Take with or immediately following a meal.   nitroGLYCERIN 0.4 MG SL tablet Commonly known as:  NITROSTAT Place 1 tablet under the tongue every 5 (five) minutes as needed for  chest pain.   pantoprazole 40 MG tablet Commonly known as:  PROTONIX Take 40 mg by mouth at bedtime.   potassium chloride 10 MEQ tablet Commonly known as:  K-DUR,KLOR-CON Take 10 mEq by mouth 2 (two) times daily.   QUESTRAN LIGHT 4 GM/DOSE powder Generic drug:  cholestyramine light Take 4 g by mouth daily as needed (for diarrhea).   ZENPEP 25000 units Cpep Generic drug:  Pancrelipase (Lip-Prot-Amyl) Take 50,000 Units by mouth 3 (three) times daily with meals.            Discharge Care Instructions        Start  Ordered   04/27/17 0000  Urine culture     04/27/17 0839   04/27/17 0000  Urinalysis, Complete     04/27/17 0851      Allergies:  Allergies  Allergen Reactions  . Hydrocodone Anaphylaxis  . Morphine Other (See Comments)  . Ambien [Zolpidem] Other (See Comments)    delirium    . Benadryl [Diphenhydramine Hcl (Sleep)]   . Brilinta [Ticagrelor] Other (See Comments)    Stroke   . Flexeril [Cyclobenzaprine] Other (See Comments)    delerium   . Flunitrazepam Other (See Comments)    ROHYPNOL (hallucinations)  . Haldol [Haloperidol Lactate] Other (See Comments)    delerium   . Levetiracetam Diarrhea and Other (See Comments)    Unable to walk  . Lorazepam Hives  . Risperdal [Risperidone] Other (See Comments)    Delirium   . Trazodone Other (See Comments)  . Penicillins Rash    Has patient had a PCN reaction causing immediate rash, facial/tongue/throat swelling, SOB or lightheadedness with hypotension: Yes Has patient had a PCN reaction causing severe rash involving mucus membranes or skin necrosis: No Has patient had a PCN reaction that required hospitalization No Has patient had a PCN reaction occurring within the last 10 years: Yes If all of the above answers are "NO", then may proceed with Cephalosporin use.    Family History: Family History  Problem Relation Age of Onset  . Diabetes Mother   . Hypertension Mother   . CAD Mother   .  Hyperlipidemia Mother   . Stroke Mother   . ALS Mother   . Alzheimer's disease Father   . Diabetes Father   . Heart Problems Brother     Social History:  reports that he quit smoking about 5 years ago. He has never used smokeless tobacco. He reports that he does not drink alcohol or use drugs.  ROS: UROLOGY Frequent Urination?: Yes Hard to postpone urination?: Yes Burning/pain with urination?: Yes Get up at night to urinate?: Yes Leakage of urine?: No Urine stream starts and stops?: Yes Trouble starting stream?: No Do you have to strain to urinate?: No Blood in urine?: No Urinary tract infection?: No Sexually transmitted disease?: No Injury to kidneys or bladder?: No Painful intercourse?: No Weak stream?: No Erection problems?: No Penile pain?: No  Gastrointestinal Nausea?: No Vomiting?: No Indigestion/heartburn?: No Diarrhea?: Yes Constipation?: No  Constitutional Fever: No Night sweats?: No Weight loss?: No Fatigue?: Yes  Skin Skin rash/lesions?: No Itching?: No  Eyes Blurred vision?: Yes Double vision?: No  Ears/Nose/Throat Sore throat?: No Sinus problems?: No  Hematologic/Lymphatic Swollen glands?: No Easy bruising?: No  Cardiovascular Leg swelling?: Yes Chest pain?: No  Respiratory Cough?: No Shortness of breath?: No  Endocrine Excessive thirst?: Yes  Musculoskeletal Back pain?: Yes Joint pain?: Yes  Neurological Headaches?: Yes Dizziness?: No  Psychologic Depression?: No Anxiety?: No  Physical Exam: BP (!) 153/63 (BP Location: Left Arm, Patient Position: Sitting, Cuff Size: Normal)   Pulse 82   Ht 5\' 7"  (1.702 m)   Wt 190 lb 14.4 oz (86.6 kg)   BMI 29.90 kg/m   Constitutional:  Alert and oriented, No acute distress. HEENT: [employer] AT, moist mucus membranes.  Trachea midline, no masses. Cardiovascular: No clubbing, cyanosis, or edema. Respiratory: Normal respiratory effort, no increased work of breathing. GI: Abdomen is soft,  nontender, nondistended, no abdominal masses GU: No CVA tenderness.  Skin: No rashes, bruises or suspicious lesions. Lymph: No cervical or inguinal adenopathy. Neurologic: Grossly intact,  no focal deficits, moving all 4 extremities. Psychiatric: Normal mood and affect.  Laboratory Data: Lab Results  Component Value Date   WBC 8.0 10/20/2016   HGB 14.4 10/20/2016   HCT 42.9 10/20/2016   MCV 93 10/20/2016   PLT 221 10/20/2016    Lab Results  Component Value Date   CREATININE 1.07 04/14/2017    No results found for: PSA  No results found for: TESTOSTERONE  Lab Results  Component Value Date   HGBA1C 8.9 (H) 04/04/2016    Urinalysis    Component Value Date/Time   COLORURINE STRAW (A) 04/04/2016 2328   APPEARANCEUR CLEAR (A) 04/04/2016 2328   APPEARANCEUR Clear 12/14/2014 1833   LABSPEC 1.004 (L) 04/04/2016 2328   LABSPEC 1.029 12/14/2014 1833   PHURINE 6.0 04/04/2016 2328   GLUCOSEU 50 (A) 04/04/2016 2328   GLUCOSEU >=500 12/14/2014 1833   HGBUR NEGATIVE 04/04/2016 2328   BILIRUBINUR NEGATIVE 04/04/2016 2328   BILIRUBINUR Negative 12/14/2014 1833   KETONESUR NEGATIVE 04/04/2016 2328   PROTEINUR NEGATIVE 04/04/2016 2328   NITRITE NEGATIVE 04/04/2016 2328   LEUKOCYTESUR NEGATIVE 04/04/2016 2328   LEUKOCYTESUR Negative 12/14/2014 1833    Pertinent Imaging: CT images reviewed as above  Assessment & Plan:   1. Right UPJ filling defect Discussed the patient that this finding is concerning for potential malignancy and requires direct visualization for further workup. We discussed undergoing cystoscopy, right ureteroscopy, ureteral biopsy, possible laser ablation of tumor, and right ureteral stent placement. We discussed that this will determine the source of the filling defect and if any more aggressive surgery is needed. Discussed the risks and benefits of this indications. He understands the risks include but are not limited to bleeding, infection, iatrogenic injury,  need for repeat procedures.  2. 1 cm exophytic upper pole right kidney lesion We discussed that this has grown slightly since 2016. It is by renal mass standards quite small however. For now, we will manage this with active surveillance. The exact schedule for repeat imaging will be based on results of above.  Nickie Retort, MD  [facility] [address], [place], [place] [address] [phone]

## 2017-05-13 NOTE — Anesthesia Post-op Follow-up Note (Signed)
Anesthesia QCDR form completed.        

## 2017-05-13 NOTE — Interval H&P Note (Signed)
History and Physical Interval Note:  05/13/2017 8:23 AM  Maurice Patterson  has presented today for surgery, with the diagnosis of Right ureteral filling defect  The various methods of treatment have been discussed with the patient and family. After consideration of risks, benefits and other options for treatment, the patient has consented to  Procedure(s): CYSTOSCOPY/URETEROSCOPY/HOLMIUM LASER (Right) CYSTOSCOPY WITH URETERAL BIOPSY (Right) CYSTOSCOPY WITH STENT PLACEMENT (Right) as a surgical intervention .  The patient's history has been reviewed, patient examined, no change in status, stable for surgery.  I have reviewed the patient's chart and labs.  Questions were answered to the patient's satisfaction.    RRR Lungs clear  Nickie Retort

## 2017-05-13 NOTE — Transfer of Care (Signed)
Immediate Anesthesia Transfer of Care Note  Patient: Maurice Patterson  Procedure(s) Performed: Procedure(s): CYSTOSCOPY WITH STENT PLACEMENT (Right)  Patient Location: PACU  Anesthesia Type:General  Level of Consciousness: awake  Airway & Oxygen Therapy: Patient Spontanous Breathing  Post-op Assessment: Report given to RN  Post vital signs: stable  Last Vitals:  Vitals:   05/13/17 0753 05/13/17 0941  BP: (!) 171/79 (!) 156/90  Pulse: 86 86  Resp: 18 16  Temp: (!) 36.4 C 36.5 C  SpO2: 100% 99%    Last Pain:  Vitals:   05/13/17 0941  TempSrc:   PainSc: (P) Asleep         Complications: No apparent anesthesia complications

## 2017-05-14 ENCOUNTER — Encounter: Payer: Self-pay | Admitting: Urology

## 2017-05-17 DIAGNOSIS — M9902 Segmental and somatic dysfunction of thoracic region: Secondary | ICD-10-CM | POA: Diagnosis not present

## 2017-05-17 DIAGNOSIS — M9903 Segmental and somatic dysfunction of lumbar region: Secondary | ICD-10-CM | POA: Diagnosis not present

## 2017-05-17 DIAGNOSIS — M9901 Segmental and somatic dysfunction of cervical region: Secondary | ICD-10-CM | POA: Diagnosis not present

## 2017-05-20 ENCOUNTER — Encounter: Payer: Self-pay | Admitting: Urology

## 2017-05-20 ENCOUNTER — Ambulatory Visit (INDEPENDENT_AMBULATORY_CARE_PROVIDER_SITE_OTHER): Payer: PPO | Admitting: Urology

## 2017-05-20 VITALS — BP 169/79 | HR 76 | Ht 67.0 in | Wt 184.0 lb

## 2017-05-20 DIAGNOSIS — N135 Crossing vessel and stricture of ureter without hydronephrosis: Secondary | ICD-10-CM | POA: Diagnosis not present

## 2017-05-20 DIAGNOSIS — N2889 Other specified disorders of kidney and ureter: Secondary | ICD-10-CM

## 2017-05-20 LAB — URINALYSIS, COMPLETE
Bilirubin, UA: NEGATIVE
NITRITE UA: NEGATIVE
SPEC GRAV UA: 1.015 (ref 1.005–1.030)
Urobilinogen, Ur: 2 mg/dL — ABNORMAL HIGH (ref 0.2–1.0)
pH, UA: 5.5 (ref 5.0–7.5)

## 2017-05-20 LAB — MICROSCOPIC EXAMINATION: Epithelial Cells (non renal): NONE SEEN /hpf (ref 0–10)

## 2017-05-20 MED ORDER — CIPROFLOXACIN HCL 500 MG PO TABS
500.0000 mg | ORAL_TABLET | Freq: Once | ORAL | Status: AC
  Administered 2017-05-20: 500 mg via ORAL

## 2017-05-20 MED ORDER — LIDOCAINE HCL 2 % EX GEL
1.0000 "application " | Freq: Once | CUTANEOUS | Status: AC
  Administered 2017-05-20: 1 via URETHRAL

## 2017-05-20 NOTE — Progress Notes (Signed)
   05/20/17  CC: No chief complaint on file.   HPI: The patient is a 61 year old gentleman who presents today for right ureteral stent removal. He underwent right ureteroscopy for a possible right pelvic filling defect. Ureteroscopy didn't reveal that this was due to a right UPJ obstruction. He presents today for removal of his right ureteral stent.  He also 1 cm exophytic upper pole right renal lesion has grown slightly in size since 2016.  There were no vitals taken for this visit. NED. A&Ox3.   No respiratory distress   Abd soft, NT, ND Normal phallus with bilateral descended testicles  Cystoscopy Procedure Note  Patient identification was confirmed, informed consent was obtained, and patient was prepped using Betadine solution.  Lidocaine jelly was administered per urethral meatus.    Preoperative abx where received prior to procedure.     Pre-Procedure: - Inspection reveals a normal caliber ureteral meatus.  Procedure: The flexible cystoscope was introduced without difficulty - No urethral strictures/lesions are present. - Right ureteral stent removed intact per meatus.  Post-Procedure: - Patient tolerated the procedure well  Assessment/ Plan:  1. Right UPJ filling defect No further work up required given the lack of symptoms, age, and medical comorbidities.  2. 1 cm exophytic upper pole right kidney lesion We'll plan for a renal ultrasound in 6 months to ensure no significant growth.

## 2017-06-01 DIAGNOSIS — Z794 Long term (current) use of insulin: Secondary | ICD-10-CM | POA: Diagnosis not present

## 2017-06-01 DIAGNOSIS — I10 Essential (primary) hypertension: Secondary | ICD-10-CM | POA: Diagnosis not present

## 2017-06-01 DIAGNOSIS — E1165 Type 2 diabetes mellitus with hyperglycemia: Secondary | ICD-10-CM | POA: Diagnosis not present

## 2017-06-01 DIAGNOSIS — Z23 Encounter for immunization: Secondary | ICD-10-CM | POA: Diagnosis not present

## 2017-06-01 DIAGNOSIS — E876 Hypokalemia: Secondary | ICD-10-CM | POA: Diagnosis not present

## 2017-06-30 ENCOUNTER — Ambulatory Visit: Payer: PPO | Admitting: Adult Health

## 2017-07-04 DIAGNOSIS — E1159 Type 2 diabetes mellitus with other circulatory complications: Secondary | ICD-10-CM | POA: Diagnosis not present

## 2017-07-04 DIAGNOSIS — E1169 Type 2 diabetes mellitus with other specified complication: Secondary | ICD-10-CM | POA: Diagnosis not present

## 2017-07-04 DIAGNOSIS — E1142 Type 2 diabetes mellitus with diabetic polyneuropathy: Secondary | ICD-10-CM | POA: Diagnosis not present

## 2017-07-04 DIAGNOSIS — Z794 Long term (current) use of insulin: Secondary | ICD-10-CM | POA: Diagnosis not present

## 2017-07-04 DIAGNOSIS — I10 Essential (primary) hypertension: Secondary | ICD-10-CM | POA: Diagnosis not present

## 2017-07-04 DIAGNOSIS — E785 Hyperlipidemia, unspecified: Secondary | ICD-10-CM | POA: Diagnosis not present

## 2017-07-13 DIAGNOSIS — M9903 Segmental and somatic dysfunction of lumbar region: Secondary | ICD-10-CM | POA: Diagnosis not present

## 2017-07-13 DIAGNOSIS — M9901 Segmental and somatic dysfunction of cervical region: Secondary | ICD-10-CM | POA: Diagnosis not present

## 2017-07-13 DIAGNOSIS — M9902 Segmental and somatic dysfunction of thoracic region: Secondary | ICD-10-CM | POA: Diagnosis not present

## 2017-07-15 DIAGNOSIS — M9902 Segmental and somatic dysfunction of thoracic region: Secondary | ICD-10-CM | POA: Diagnosis not present

## 2017-07-15 DIAGNOSIS — M9903 Segmental and somatic dysfunction of lumbar region: Secondary | ICD-10-CM | POA: Diagnosis not present

## 2017-07-15 DIAGNOSIS — M9901 Segmental and somatic dysfunction of cervical region: Secondary | ICD-10-CM | POA: Diagnosis not present

## 2017-07-19 DIAGNOSIS — M9903 Segmental and somatic dysfunction of lumbar region: Secondary | ICD-10-CM | POA: Diagnosis not present

## 2017-07-19 DIAGNOSIS — M9902 Segmental and somatic dysfunction of thoracic region: Secondary | ICD-10-CM | POA: Diagnosis not present

## 2017-07-19 DIAGNOSIS — M9901 Segmental and somatic dysfunction of cervical region: Secondary | ICD-10-CM | POA: Diagnosis not present

## 2017-07-21 DIAGNOSIS — M9903 Segmental and somatic dysfunction of lumbar region: Secondary | ICD-10-CM | POA: Diagnosis not present

## 2017-07-21 DIAGNOSIS — M9901 Segmental and somatic dysfunction of cervical region: Secondary | ICD-10-CM | POA: Diagnosis not present

## 2017-07-21 DIAGNOSIS — M9902 Segmental and somatic dysfunction of thoracic region: Secondary | ICD-10-CM | POA: Diagnosis not present

## 2017-07-26 DIAGNOSIS — M9901 Segmental and somatic dysfunction of cervical region: Secondary | ICD-10-CM | POA: Diagnosis not present

## 2017-07-26 DIAGNOSIS — M9903 Segmental and somatic dysfunction of lumbar region: Secondary | ICD-10-CM | POA: Diagnosis not present

## 2017-07-26 DIAGNOSIS — M9902 Segmental and somatic dysfunction of thoracic region: Secondary | ICD-10-CM | POA: Diagnosis not present

## 2017-07-29 DIAGNOSIS — M9903 Segmental and somatic dysfunction of lumbar region: Secondary | ICD-10-CM | POA: Diagnosis not present

## 2017-07-29 DIAGNOSIS — M9901 Segmental and somatic dysfunction of cervical region: Secondary | ICD-10-CM | POA: Diagnosis not present

## 2017-07-29 DIAGNOSIS — M9902 Segmental and somatic dysfunction of thoracic region: Secondary | ICD-10-CM | POA: Diagnosis not present

## 2017-08-01 DIAGNOSIS — M9901 Segmental and somatic dysfunction of cervical region: Secondary | ICD-10-CM | POA: Diagnosis not present

## 2017-08-01 DIAGNOSIS — M9902 Segmental and somatic dysfunction of thoracic region: Secondary | ICD-10-CM | POA: Diagnosis not present

## 2017-08-01 DIAGNOSIS — M9903 Segmental and somatic dysfunction of lumbar region: Secondary | ICD-10-CM | POA: Diagnosis not present

## 2017-08-03 DIAGNOSIS — M9903 Segmental and somatic dysfunction of lumbar region: Secondary | ICD-10-CM | POA: Diagnosis not present

## 2017-08-03 DIAGNOSIS — M9902 Segmental and somatic dysfunction of thoracic region: Secondary | ICD-10-CM | POA: Diagnosis not present

## 2017-08-03 DIAGNOSIS — M9901 Segmental and somatic dysfunction of cervical region: Secondary | ICD-10-CM | POA: Diagnosis not present

## 2017-08-05 DIAGNOSIS — M9902 Segmental and somatic dysfunction of thoracic region: Secondary | ICD-10-CM | POA: Diagnosis not present

## 2017-08-05 DIAGNOSIS — M9901 Segmental and somatic dysfunction of cervical region: Secondary | ICD-10-CM | POA: Diagnosis not present

## 2017-08-05 DIAGNOSIS — M9903 Segmental and somatic dysfunction of lumbar region: Secondary | ICD-10-CM | POA: Diagnosis not present

## 2017-08-08 DIAGNOSIS — M9902 Segmental and somatic dysfunction of thoracic region: Secondary | ICD-10-CM | POA: Diagnosis not present

## 2017-08-08 DIAGNOSIS — M9903 Segmental and somatic dysfunction of lumbar region: Secondary | ICD-10-CM | POA: Diagnosis not present

## 2017-08-08 DIAGNOSIS — M9901 Segmental and somatic dysfunction of cervical region: Secondary | ICD-10-CM | POA: Diagnosis not present

## 2017-08-11 DIAGNOSIS — M9903 Segmental and somatic dysfunction of lumbar region: Secondary | ICD-10-CM | POA: Diagnosis not present

## 2017-08-11 DIAGNOSIS — M9902 Segmental and somatic dysfunction of thoracic region: Secondary | ICD-10-CM | POA: Diagnosis not present

## 2017-08-11 DIAGNOSIS — M9901 Segmental and somatic dysfunction of cervical region: Secondary | ICD-10-CM | POA: Diagnosis not present

## 2017-08-15 IMAGING — PT NM PET BRAIN AMYLOID
1 of 6 series · 4 of 25 positions shown · non-contrast
Comparison: Head CT 10/08/2016.

CLINICAL DATA: Altered mental status, behavioral disturbances,
evaluate for Alzheimer's disease versus frontotemporal dementia.

EXAM:
NM PET METABOLIC BRAIN
TECHNIQUE: 9.82 mCi F-18 FDG was injected intravenously via the antecubital
fossa. Full-ring PET imaging was performed from the vertex to the
skull base. CT data was obtained and used for attenuation correction
and anatomic localization.

[Series 4: ct brain 3.0 h31s · axial · 3.0mm · 0.45mm/px · z∈[+388,+490]mm · 4 of 87 slices shown]
[im 18/87  brain]
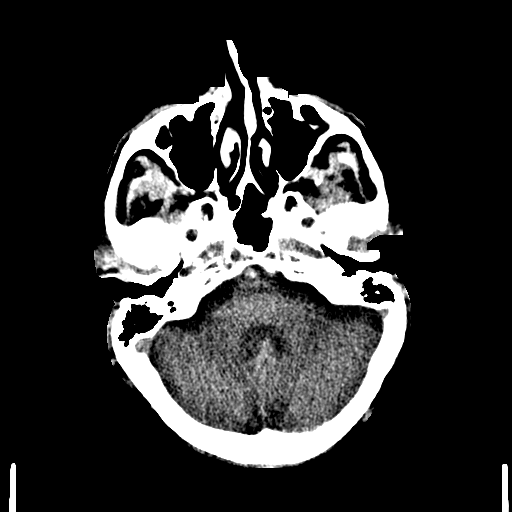
[im 35/87  brain]
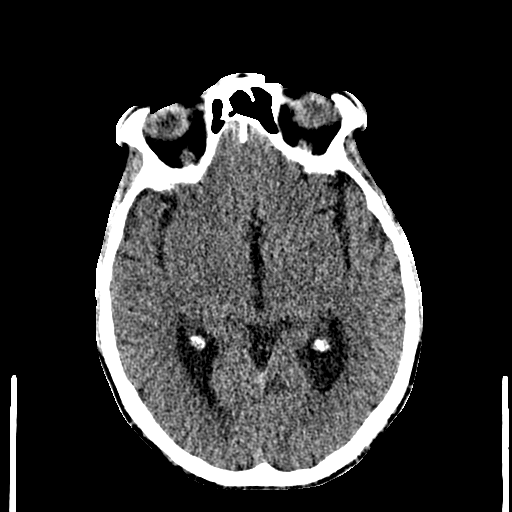
[im 52/87  brain]
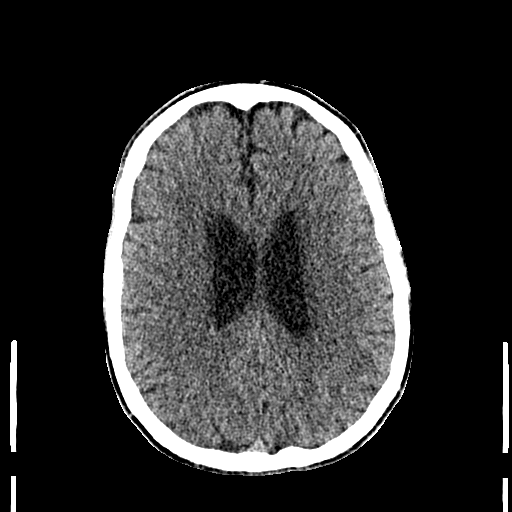
[im 69/87  brain]
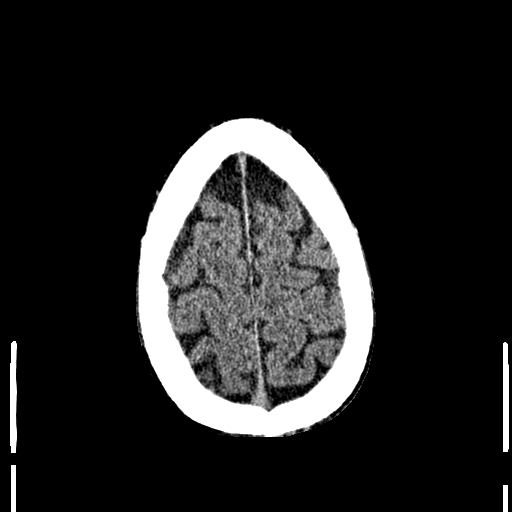

[4 of 25 positions shown; findings below may reference images not displayed]

FINDINGS: Patient was agitated in the scanner which resulted in head movement
during the scan. Some misregistration between the CT and PET data
sets particularly in the lower head.

Decreased relative cortical metabolism within the LEFT and RIGHT
parietal lobes in relation to the frontal lobes. Decrease relative
metabolism in the temporal lobes to lesser degree. Decreased
relative metabolism also evident within the LEFT occipital lobe.

No acute findings on the CT imaging. No mass lesion or infarction by
CT imaging.
IMPRESSION: Decreased relative cortical metabolism in the biparietal lobes and
temporal lobes is a pattern suggestive of Alzheimer's type
pathology.

These results will be called to the ordering clinician or
representative by the Radiologist Assistant, and communication
documented in the PACS or zVision Dashboard.

## 2017-08-17 DIAGNOSIS — M9901 Segmental and somatic dysfunction of cervical region: Secondary | ICD-10-CM | POA: Diagnosis not present

## 2017-08-17 DIAGNOSIS — M9902 Segmental and somatic dysfunction of thoracic region: Secondary | ICD-10-CM | POA: Diagnosis not present

## 2017-08-17 DIAGNOSIS — M9903 Segmental and somatic dysfunction of lumbar region: Secondary | ICD-10-CM | POA: Diagnosis not present

## 2017-08-19 DIAGNOSIS — M9903 Segmental and somatic dysfunction of lumbar region: Secondary | ICD-10-CM | POA: Diagnosis not present

## 2017-08-19 DIAGNOSIS — M9901 Segmental and somatic dysfunction of cervical region: Secondary | ICD-10-CM | POA: Diagnosis not present

## 2017-08-19 DIAGNOSIS — M9902 Segmental and somatic dysfunction of thoracic region: Secondary | ICD-10-CM | POA: Diagnosis not present

## 2017-08-22 DIAGNOSIS — M9902 Segmental and somatic dysfunction of thoracic region: Secondary | ICD-10-CM | POA: Diagnosis not present

## 2017-08-22 DIAGNOSIS — M9903 Segmental and somatic dysfunction of lumbar region: Secondary | ICD-10-CM | POA: Diagnosis not present

## 2017-08-22 DIAGNOSIS — M9901 Segmental and somatic dysfunction of cervical region: Secondary | ICD-10-CM | POA: Diagnosis not present

## 2017-08-23 DIAGNOSIS — I639 Cerebral infarction, unspecified: Secondary | ICD-10-CM

## 2017-08-23 HISTORY — DX: Cerebral infarction, unspecified: I63.9

## 2017-08-27 DIAGNOSIS — Z880 Allergy status to penicillin: Secondary | ICD-10-CM | POA: Diagnosis not present

## 2017-08-27 DIAGNOSIS — H02402 Unspecified ptosis of left eyelid: Secondary | ICD-10-CM | POA: Diagnosis not present

## 2017-08-27 DIAGNOSIS — R471 Dysarthria and anarthria: Secondary | ICD-10-CM | POA: Diagnosis not present

## 2017-08-27 DIAGNOSIS — F0280 Dementia in other diseases classified elsewhere without behavioral disturbance: Secondary | ICD-10-CM | POA: Diagnosis not present

## 2017-08-27 DIAGNOSIS — Z794 Long term (current) use of insulin: Secondary | ICD-10-CM | POA: Diagnosis not present

## 2017-08-27 DIAGNOSIS — R29810 Facial weakness: Secondary | ICD-10-CM | POA: Diagnosis not present

## 2017-08-27 DIAGNOSIS — I2510 Atherosclerotic heart disease of native coronary artery without angina pectoris: Secondary | ICD-10-CM | POA: Diagnosis not present

## 2017-08-27 DIAGNOSIS — R930 Abnormal findings on diagnostic imaging of skull and head, not elsewhere classified: Secondary | ICD-10-CM | POA: Diagnosis not present

## 2017-08-27 DIAGNOSIS — G309 Alzheimer's disease, unspecified: Secondary | ICD-10-CM | POA: Diagnosis not present

## 2017-08-27 DIAGNOSIS — G510 Bell's palsy: Secondary | ICD-10-CM | POA: Diagnosis not present

## 2017-08-27 DIAGNOSIS — E785 Hyperlipidemia, unspecified: Secondary | ICD-10-CM | POA: Diagnosis not present

## 2017-08-27 DIAGNOSIS — Z955 Presence of coronary angioplasty implant and graft: Secondary | ICD-10-CM | POA: Diagnosis not present

## 2017-08-27 DIAGNOSIS — I10 Essential (primary) hypertension: Secondary | ICD-10-CM | POA: Diagnosis not present

## 2017-08-27 DIAGNOSIS — R0602 Shortness of breath: Secondary | ICD-10-CM | POA: Diagnosis not present

## 2017-08-27 DIAGNOSIS — I639 Cerebral infarction, unspecified: Secondary | ICD-10-CM | POA: Diagnosis not present

## 2017-08-27 DIAGNOSIS — I252 Old myocardial infarction: Secondary | ICD-10-CM | POA: Diagnosis not present

## 2017-08-27 DIAGNOSIS — G40909 Epilepsy, unspecified, not intractable, without status epilepticus: Secondary | ICD-10-CM | POA: Diagnosis not present

## 2017-08-27 DIAGNOSIS — Z7982 Long term (current) use of aspirin: Secondary | ICD-10-CM | POA: Diagnosis not present

## 2017-08-27 DIAGNOSIS — R4182 Altered mental status, unspecified: Secondary | ICD-10-CM | POA: Diagnosis not present

## 2017-08-27 DIAGNOSIS — R4781 Slurred speech: Secondary | ICD-10-CM | POA: Diagnosis not present

## 2017-08-27 DIAGNOSIS — R531 Weakness: Secondary | ICD-10-CM | POA: Diagnosis not present

## 2017-08-27 DIAGNOSIS — M545 Low back pain: Secondary | ICD-10-CM | POA: Diagnosis not present

## 2017-08-27 DIAGNOSIS — R109 Unspecified abdominal pain: Secondary | ICD-10-CM | POA: Diagnosis not present

## 2017-08-27 DIAGNOSIS — R296 Repeated falls: Secondary | ICD-10-CM | POA: Diagnosis not present

## 2017-08-27 DIAGNOSIS — Z87891 Personal history of nicotine dependence: Secondary | ICD-10-CM | POA: Diagnosis not present

## 2017-08-27 DIAGNOSIS — K8500 Idiopathic acute pancreatitis without necrosis or infection: Secondary | ICD-10-CM | POA: Diagnosis not present

## 2017-08-27 DIAGNOSIS — F0390 Unspecified dementia without behavioral disturbance: Secondary | ICD-10-CM | POA: Diagnosis not present

## 2017-08-27 DIAGNOSIS — H538 Other visual disturbances: Secondary | ICD-10-CM | POA: Diagnosis not present

## 2017-08-27 DIAGNOSIS — E11649 Type 2 diabetes mellitus with hypoglycemia without coma: Secondary | ICD-10-CM | POA: Diagnosis not present

## 2017-09-01 ENCOUNTER — Other Ambulatory Visit: Payer: Self-pay

## 2017-09-01 NOTE — Patient Outreach (Signed)
[facility]) Care Management  09/01/2017  JERRON NIBLACK February 01, 1956 [id number]   Telephone call to patient for transition of care assessment.  No answer.  HIPAA compliant voice message left.   Plan: RN CM will attempt patient again within 3 business days.   Jone Baseman, RN, MSN [facility] Care Management Care Management Coordinator Direct Line [phone] Toll Free: [phone]  Fax: [phone]

## 2017-09-02 ENCOUNTER — Other Ambulatory Visit: Payer: Self-pay

## 2017-09-02 NOTE — Patient Outreach (Signed)
[facility]) Care Management  09/02/2017  Maurice Patterson 1956-01-29 [id number]   2nd telephone call to patient for transition of care assessment.  No answer.  HIPAA compliant voice message left.  Plan: RN CM will attempt patient again within 3 business days.  Jone Baseman, RN, MSN [facility] Care Management Care Management Coordinator Direct Line [phone] Toll Free: [phone]  Fax: [phone]

## 2017-09-05 ENCOUNTER — Other Ambulatory Visit: Payer: Self-pay

## 2017-09-05 DIAGNOSIS — G40909 Epilepsy, unspecified, not intractable, without status epilepticus: Secondary | ICD-10-CM | POA: Diagnosis not present

## 2017-09-05 DIAGNOSIS — K529 Noninfective gastroenteritis and colitis, unspecified: Secondary | ICD-10-CM | POA: Diagnosis not present

## 2017-09-05 DIAGNOSIS — E1165 Type 2 diabetes mellitus with hyperglycemia: Secondary | ICD-10-CM | POA: Diagnosis not present

## 2017-09-05 DIAGNOSIS — I10 Essential (primary) hypertension: Secondary | ICD-10-CM | POA: Diagnosis not present

## 2017-09-05 DIAGNOSIS — Z794 Long term (current) use of insulin: Secondary | ICD-10-CM | POA: Diagnosis not present

## 2017-09-05 NOTE — Patient Outreach (Signed)
[facility]) Care Management  09/05/2017  Maurice Patterson [birth year] [id number]   Telephone call to patient for transition of care assessment.  No answer. Unable to leave a voice message.  Plan: RN CM will send letter to attempt outreach.  If no response within ten business days will close case.   Jone Baseman, RN, MSN [facility] Care Management Care Management Coordinator Direct Line [phone] Toll Free: [phone]  Fax: [phone]

## 2017-09-19 ENCOUNTER — Other Ambulatory Visit: Payer: Self-pay

## 2017-09-19 NOTE — Patient Outreach (Signed)
[facility]) Care Management  09/19/2017  Maurice Patterson 03-04-1956 [id number]   Multiple attempts to establish contact with patient without success. No response from letter mailed to patient. Case is being closed at this time.   Plan: RN CM will notify [facility] administrative assistant of case status.  Jone Baseman, RN, MSN [facility] Care Management Care Management Coordinator Direct Line [phone] Toll Free: [phone]  Fax: [phone]

## 2017-11-18 ENCOUNTER — Ambulatory Visit: Payer: PPO

## 2017-11-25 ENCOUNTER — Ambulatory Visit
Admission: RE | Admit: 2017-11-25 | Discharge: 2017-11-25 | Disposition: A | Discharge status: Home / Self Care | Payer: PPO | Source: Ambulatory Visit | Attending: Urology | Admitting: Urology

## 2017-11-25 DIAGNOSIS — N2889 Other specified disorders of kidney and ureter: Secondary | ICD-10-CM | POA: Diagnosis not present

## 2017-12-08 ENCOUNTER — Ambulatory Visit: Payer: PPO | Admitting: Urology

## 2017-12-08 ENCOUNTER — Encounter: Payer: Self-pay | Admitting: Urology

## 2017-12-08 VITALS — BP 135/74 | HR 69 | Ht 67.0 in | Wt 184.4 lb

## 2017-12-08 DIAGNOSIS — N2889 Other specified disorders of kidney and ureter: Secondary | ICD-10-CM

## 2017-12-08 DIAGNOSIS — N135 Crossing vessel and stricture of ureter without hydronephrosis: Secondary | ICD-10-CM | POA: Diagnosis not present

## 2017-12-08 LAB — MICROSCOPIC EXAMINATION
BACTERIA UA: NONE SEEN
EPITHELIAL CELLS (NON RENAL): NONE SEEN /HPF (ref 0–10)
RBC, UA: NONE SEEN /hpf (ref 0–2)
WBC, UA: NONE SEEN /hpf (ref 0–5)

## 2017-12-08 LAB — URINALYSIS, COMPLETE
BILIRUBIN UA: NEGATIVE
GLUCOSE, UA: NEGATIVE
Ketones, UA: NEGATIVE
LEUKOCYTES UA: NEGATIVE
Nitrite, UA: NEGATIVE
RBC UA: NEGATIVE
Specific Gravity, UA: >1.03 — ABNORMAL HIGH (ref 1.005–1.030)
Urobilinogen, Ur: 0.2 mg/dL (ref 0.2–1.0)
pH, UA: 5.5 (ref 5.0–7.5)

## 2017-12-08 NOTE — Progress Notes (Signed)
62 year old male seen today in follow-up, 6 months from his last visit, for further evaluation of a right renal mass.  This was noted on a CT scan performed in August 2018 for an abnormal pancreas.  He was noted to have a right-sided filling defect for which he underwent ureteroscopy which was reassuring for no clinically significant pathology.  He underwent a renal ultrasound prior to today visit for further monitoring of the renal mass.  Since the patient was last seen he has not had any significant changes.  The patient does have early onset Alzheimer's disease  As well as a seizure disorder.  Past Medical History:  Diagnosis Date  . Acute MI ([place])   . Arthritis   . Back pain   . CAD (coronary artery disease)   . CHF (congestive heart failure) ([place])   . Chronic kidney disease   . Degenerative lumbar disc   . Diabetes mellitus without complication ([place])   . GERD (gastroesophageal reflux disease)   . Hernia of abdominal cavity   . Hyperlipemia   . Hypertension   . Malignant intraductal papillary mucinous tumor of pancreas ([place])   . Memory loss   . Pancreatitis   . Seizures ([place])    staring spells  . Shingles   . Stroke ([place])   . TIA (transient ischemic attack)    Past Surgical History:  Procedure Laterality Date  . CARDIAC CATHETERIZATION    . CORONARY ANGIOPLASTY    . CYSTOSCOPY WITH STENT PLACEMENT Right 05/13/2017   Procedure: CYSTOSCOPY WITH STENT PLACEMENT;  Surgeon: Nickie Retort, MD;  Location: [facility] ORS;  Service: Urology;  Laterality: Right;  . ERCP N/A 09/12/2015   Procedure: ENDOSCOPIC RETROGRADE CHOLANGIOPANCREATOGRAPHY (ERCP);  Surgeon: Hulen Luster, MD;  Location: [facility] ENDOSCOPY;  Service: Gastroenterology;  Laterality: N/A;  . ERCP N/A 01/02/2016   Procedure: ENDOSCOPIC RETROGRADE CHOLANGIOPANCREATOGRAPHY (ERCP);  Surgeon: Hulen Luster, MD;  Location: [facility] ENDOSCOPY;  Service: Gastroenterology;  Laterality: N/A;  . EXTERNAL FIXATION WRIST FRACTURE Left   . left arm  metal plate    . LEFT HEART CATH AND CORONARY ANGIOGRAPHY Left 11/04/2016   Procedure: Left Heart Cath and Coronary Angiography;  Surgeon: Isaias Cowman, MD;  Location: [place] CV LAB;  Service: Cardiovascular;  Laterality: Left;  . Left shoulder surgery    . Stent times 3  2013   Cardiac  . TOTAL ELBOW REPLACEMENT Left   . VASECTOMY    . VENTRICULOPERITONEAL SHUNT     Current Outpatient Medications on File Prior to Visit  Medication Sig Dispense Refill  . cholecalciferol (VITAMIN D) 1000 units tablet Take 1,000 Units by mouth daily.    . clopidogrel (PLAVIX) 75 MG tablet Take 75 mg by mouth daily.     . colestipol (COLESTID) 1 g tablet Take 2 g by mouth daily at 12 noon.    . diphenoxylate-atropine (LOMOTIL) 2.5-0.025 MG tablet Take 1 tablet by mouth 3 (three) times daily as needed for diarrhea or loose stools.    . donepezil (ARICEPT) 10 MG tablet Take 1 tablet (10 mg total) by mouth 2 (two) times daily. 180 tablet 6  . furosemide (LASIX) 40 MG tablet Take 40 mg by mouth at bedtime.    . insulin NPH Human (HUMULIN N,NOVOLIN N) 100 UNIT/ML injection Inject 0.25 mLs (25 Units total) into the skin 2 (two) times daily. (Patient taking differently: Inject 30 Units into the skin 2 (two) times daily. ) 10 mL 11  . insulin regular (NOVOLIN  R,HUMULIN R) 100 units/mL injection Inject 0.3 mLs (30 Units total) into the skin 3 (three) times daily with meals. (Patient taking differently: Inject 34 Units into the skin 3 (three) times daily before meals. 34 units subcutaneous 3 times daily with meals with an additional sliding scale of 8 units added with every 20 glucose reading over 200.) 10 mL 1  . isosorbide mononitrate (IMDUR) 30 MG 24 hr tablet Take 30 mg by mouth daily.     Marland Kitchen ketorolac (TORADOL) 10 MG tablet Take 1 tablet (10 mg total) by mouth every 6 (six) hours as needed. 20 tablet 0  . lamoTRIgine (LAMICTAL) 100 MG tablet Take 100 mg by mouth 2 (two) times daily.    Marland Kitchen lidocaine  (LIDODERM) 5 % Place 1 patch onto the skin daily as needed (pain). Remove & Discard patch within 12 hours or as directed by MD (APPLIED TO PATIENT BACK FOR PAIN)    . losartan (COZAAR) 100 MG tablet Take 100 mg by mouth daily.    . memantine (NAMENDA) 10 MG tablet Take 1 tablet (10 mg total) by mouth 2 (two) times daily. 180 tablet 3  . metoprolol succinate (TOPROL-XL) 100 MG 24 hr tablet Take 100 mg by mouth daily. Take with or immediately following a meal.    . naproxen sodium (ANAPROX) 220 MG tablet Take 220 mg by mouth daily as needed (headaches).    . nitroGLYCERIN (NITROSTAT) 0.4 MG SL tablet Place 1 tablet under the tongue every 5 (five) minutes as needed for chest pain.     . Pancrelipase, Lip-Prot-Amyl, (ZENPEP) 25000 units CPEP Take 50,000 Units by mouth 3 (three) times daily with meals.    . pantoprazole (PROTONIX) 40 MG tablet Take 40 mg by mouth at bedtime.     . potassium chloride (K-DUR,KLOR-CON) 10 MEQ tablet Take 10 mEq by mouth 2 (two) times daily.    Marland Kitchen acetaminophen (TYLENOL) 500 MG tablet Take 500 mg by mouth 3 (three) times daily.    Marland Kitchen aspirin EC 81 MG tablet Take 81 mg by mouth daily.    Marland Kitchen atorvastatin (LIPITOR) 80 MG tablet Take 80 mg by mouth daily.     No current facility-administered medications on file prior to visit.    Vitals:   12/08/17 1156  BP: 135/74  Pulse: 69  Weight: 83.6 kg (184 lb 6.4 oz)  Height: 5\' 7"  (1.702 m)   NAD Somewhat confused, but alert and oriented.  Renal ultrasound: The patient has a 12 mm anterior upper pole renal mass that is otherwise poorly characterized.  I reviewed  these images and discussed them with the patient.  Impression: The patient has a slowly growing right upper pole renal mass, the CT scan 6 months prior demonstrated mild enhancement.  The patient's renal ultrasound today demonstrates stability without any significant changes.  Recommendation: At this point, recommended that the patient follow-up with [place] in 1 year with  an MRI prior.

## 2017-12-12 ENCOUNTER — Ambulatory Visit (INDEPENDENT_AMBULATORY_CARE_PROVIDER_SITE_OTHER): Payer: PPO

## 2017-12-12 ENCOUNTER — Encounter: Payer: Self-pay | Admitting: Emergency Medicine

## 2017-12-12 ENCOUNTER — Other Ambulatory Visit: Payer: Self-pay

## 2017-12-12 ENCOUNTER — Ambulatory Visit
Admission: EM | Admit: 2017-12-12 | Discharge: 2017-12-12 | Disposition: A | Discharge status: Home / Self Care | Payer: PPO | Attending: Emergency Medicine | Admitting: Emergency Medicine

## 2017-12-12 DIAGNOSIS — R05 Cough: Secondary | ICD-10-CM

## 2017-12-12 DIAGNOSIS — R079 Chest pain, unspecified: Secondary | ICD-10-CM | POA: Diagnosis not present

## 2017-12-12 DIAGNOSIS — R059 Cough, unspecified: Secondary | ICD-10-CM

## 2017-12-12 HISTORY — DX: Unspecified dementia, unspecified severity, without behavioral disturbance, psychotic disturbance, mood disturbance, and anxiety: F03.90

## 2017-12-12 MED ORDER — ALBUTEROL SULFATE HFA 108 (90 BASE) MCG/ACT IN AERS: 1.0000 | INHALATION_SPRAY | Freq: Four times a day (QID) | RESPIRATORY_TRACT | 0 refills | Status: DC | PRN

## 2017-12-12 MED ORDER — BENZONATATE 200 MG PO CAPS: 200.0000 mg | ORAL_CAPSULE | Freq: Three times a day (TID) | ORAL | 0 refills | Status: DC | PRN

## 2017-12-12 MED ORDER — AEROCHAMBER PLUS MISC: 2 refills | Status: DC

## 2017-12-12 MED ORDER — DOXYCYCLINE HYCLATE 100 MG PO CAPS: 100.0000 mg | ORAL_CAPSULE | Freq: Two times a day (BID) | ORAL | 0 refills | Status: AC

## 2017-12-12 NOTE — ED Provider Notes (Signed)
HPI  SUBJECTIVE:  Maurice Patterson is a 62 y.o. male who presents with clear nasal congestion for the past 3 days, nonproductive cough, chills.  He reports left-sided sharp intermittent chest pain that lasts approximately a minute that is present only with coughing.  Wife reports decreased appetite, and increased work of breathing.  chest pain is not exertional.  He denies chest pressure,  heaviness or symptoms like his previous heart attack.  No wheezing, shortness of breath, dyspnea on exertion.  No altered mental status per wife.  Glucose has been running within normal limits for him.  No vomiting.  No change in his baseline diarrhea.  No sinus pain or pressure, no postnasal drip.  No body aches, headaches.  Patient states that he is sleeping okay at night.  No lower extremity edema, nocturia, orthopnea, DOE, unintentional weight gain, PND.  No abdominal pain.  No GERD symptoms.  Wife has been giving him Mucinex, albuterol nebs, Flonase, Tylenol without improvement of symptoms.  Symptoms are worse with coughing.  The pain is not associated with torso rotation, or movement. Pt Has an extensive medical history including stroke, TIA, MI, coronary disease, CHF, chronic kidney disease, diabetes, hypertension, pancreatitis, GERD, RLL PNA, seizure disorder, early onset Alzheimer's.  He is not on any ACE inhibitors.  No history of asthma, emphysema, COPD, smoking.  [id number], Pamala Hurry, MD   Past Medical History:  Diagnosis Date  . Acute MI ([place])   . Arthritis   . Back pain   . CAD (coronary artery disease)   . CHF (congestive heart failure) ([place])   . Chronic kidney disease   . Degenerative lumbar disc   . Dementia   . Diabetes mellitus without complication ([place])   . GERD (gastroesophageal reflux disease)   . Hernia of abdominal cavity   . Hyperlipemia   . Hypertension   . Malignant intraductal papillary mucinous tumor of pancreas ([place])   . Memory loss   . Pancreatitis   . Seizures ([place])     staring spells  . Shingles   . Stroke ([place])   . TIA (transient ischemic attack)     Past Surgical History:  Procedure Laterality Date  . CARDIAC CATHETERIZATION    . CORONARY ANGIOPLASTY    . CYSTOSCOPY WITH STENT PLACEMENT Right 05/13/2017   Procedure: CYSTOSCOPY WITH STENT PLACEMENT;  Surgeon: Nickie Retort, MD;  Location: [facility] ORS;  Service: Urology;  Laterality: Right;  . ERCP N/A 09/12/2015   Procedure: ENDOSCOPIC RETROGRADE CHOLANGIOPANCREATOGRAPHY (ERCP);  Surgeon: Hulen Luster, MD;  Location[facility] ENDOSCOPY;  Service: Gastroenterology;  Laterality: N/A;  . ERCP N/A 01/02/2016   Procedure: ENDOSCOPIC RETROGRADE CHOLANGIOPANCREATOGRAPHY (ERCP);  Surgeon: Hulen Luster, MD;  Location: [facility] ENDOSCOPY;  Service: Gastroenterology;  Laterality: N/A;  . EXTERNAL FIXATION WRIST FRACTURE Left   . left arm metal plate    . LEFT HEART CATH AND CORONARY ANGIOGRAPHY Left 11/04/2016   Procedure: Left Heart Cath and Coronary Angiography;  Surgeon: Isaias Cowman, MD;  Location: [place] CV LAB;  Service: Cardiovascular;  Laterality: Left;  . Left shoulder surgery    . Stent times 3  2013   Cardiac  . TOTAL ELBOW REPLACEMENT Left   . VASECTOMY    . VENTRICULOPERITONEAL SHUNT      Family History  Problem Relation Age of Onset  . Diabetes Mother   . Hypertension Mother   . CAD Mother   . Hyperlipidemia Mother   . Stroke Mother   . ALS Mother   .  Alzheimer's disease Father   . Diabetes Father   . Heart Problems Brother     Social History   Tobacco Use  . Smoking status: Former Smoker    Last attempt to quit: 2013    Years since quitting: 6.3  . Smokeless tobacco: Never Used  . Tobacco comment: used to smoke 2PD for 40 yrs, quit about 4 years ago  Substance Use Topics  . Alcohol use: No    Alcohol/week: 0.0 oz    Comment: occasional  . Drug use: No    No current facility-administered medications for this encounter.   Current Outpatient Medications:  .  acetaminophen  (TYLENOL) 500 MG tablet, Take 500 mg by mouth 3 (three) times daily., Disp: , Rfl:  .  aspirin EC 81 MG tablet, Take 81 mg by mouth daily., Disp: , Rfl:  .  atorvastatin (LIPITOR) 80 MG tablet, Take 80 mg by mouth daily., Disp: , Rfl:  .  cholecalciferol (VITAMIN D) 1000 units tablet, Take 1,000 Units by mouth daily., Disp: , Rfl:  .  clopidogrel (PLAVIX) 75 MG tablet, Take 75 mg by mouth daily. , Disp: , Rfl:  .  colestipol (COLESTID) 1 g tablet, Take 2 g by mouth daily at 12 noon., Disp: , Rfl:  .  diphenoxylate-atropine (LOMOTIL) 2.5-0.025 MG tablet, Take 1 tablet by mouth 3 (three) times daily as needed for diarrhea or loose stools., Disp: , Rfl:  .  donepezil (ARICEPT) 10 MG tablet, Take 1 tablet (10 mg total) by mouth 2 (two) times daily., Disp: 180 tablet, Rfl: 6 .  furosemide (LASIX) 40 MG tablet, Take 40 mg by mouth at bedtime., Disp: , Rfl:  .  insulin NPH Human (HUMULIN N,NOVOLIN N) 100 UNIT/ML injection, Inject 0.25 mLs (25 Units total) into the skin 2 (two) times daily. (Patient taking differently: Inject 30 Units into the skin 2 (two) times daily. ), Disp: 10 mL, Rfl: 11 .  insulin regular (NOVOLIN R,HUMULIN R) 100 units/mL injection, Inject 0.3 mLs (30 Units total) into the skin 3 (three) times daily with meals. (Patient taking differently: Inject 34 Units into the skin 3 (three) times daily before meals. 34 units subcutaneous 3 times daily with meals with an additional sliding scale of 8 units added with every 20 glucose reading over 200.), Disp: 10 mL, Rfl: 1 .  isosorbide mononitrate (IMDUR) 30 MG 24 hr tablet, Take 30 mg by mouth daily. , Disp: , Rfl:  .  lamoTRIgine (LAMICTAL) 100 MG tablet, Take 100 mg by mouth 2 (two) times daily., Disp: , Rfl:  .  losartan (COZAAR) 100 MG tablet, Take 100 mg by mouth daily., Disp: , Rfl:  .  memantine (NAMENDA) 10 MG tablet, Take 1 tablet (10 mg total) by mouth 2 (two) times daily., Disp: 180 tablet, Rfl: 3 .  metoprolol succinate (TOPROL-XL)  100 MG 24 hr tablet, Take 100 mg by mouth daily. Take with or immediately following a meal., Disp: , Rfl:  .  Pancrelipase, Lip-Prot-Amyl, (ZENPEP) 25000 units CPEP, Take 50,000 Units by mouth 3 (three) times daily with meals., Disp: , Rfl:  .  pantoprazole (PROTONIX) 40 MG tablet, Take 40 mg by mouth at bedtime. , Disp: , Rfl:  .  potassium chloride (K-DUR,KLOR-CON) 10 MEQ tablet, Take 10 mEq by mouth 2 (two) times daily., Disp: , Rfl:  .  albuterol (PROVENTIL HFA;VENTOLIN HFA) 108 (90 Base) MCG/ACT inhaler, Inhale 1-2 puffs into the lungs every 6 (six) hours as needed for wheezing or shortness  of breath., Disp: 1 Inhaler, Rfl: 0 .  benzonatate (TESSALON) 200 MG capsule, Take 1 capsule (200 mg total) by mouth 3 (three) times daily as needed for cough., Disp: 30 capsule, Rfl: 0 .  doxycycline (VIBRAMYCIN) 100 MG capsule, Take 1 capsule (100 mg total) by mouth 2 (two) times daily for 7 days., Disp: 14 capsule, Rfl: 0 .  lidocaine (LIDODERM) 5 %, Place 1 patch onto the skin daily as needed (pain). Remove & Discard patch within 12 hours or as directed by MD (APPLIED TO PATIENT BACK FOR PAIN), Disp: , Rfl:  .  nitroGLYCERIN (NITROSTAT) 0.4 MG SL tablet, Place 1 tablet under the tongue every 5 (five) minutes as needed for chest pain. , Disp: , Rfl:  .  Spacer/Aero-Holding Chambers (AEROCHAMBER PLUS) inhaler, Use as instructed, Disp: 1 each, Rfl: 2  Allergies  Allergen Reactions  . Hydrocodone Anaphylaxis  . Morphine Other (See Comments)    Loss of memory  . Ambien [Zolpidem] Other (See Comments)    delirium    . Brilinta [Ticagrelor] Other (See Comments)    Stroke   . Flexeril [Cyclobenzaprine] Other (See Comments)    delerium   . Flunitrazepam Other (See Comments)    ROHYPNOL (hallucinations)  . Haldol [Haloperidol Lactate] Other (See Comments)    delerium   . Levetiracetam Diarrhea and Other (See Comments)    Unable to walk  . Lorazepam Hives  . Risperdal [Risperidone] Other (See  Comments)    Delirium   . Trazodone Other (See Comments)    Delirium Can take in low doses   . Benadryl [Diphenhydramine Hcl (Sleep)] Rash  . Penicillins Rash    Mouth ulcers Has patient had a PCN reaction causing immediate rash, facial/tongue/throat swelling, SOB or lightheadedness with hypotension: Yes Has patient had a PCN reaction causing severe rash involving mucus membranes or skin necrosis: No Has patient had a PCN reaction that required hospitalization No Has patient had a PCN reaction occurring within the last 10 years: Yes If all of the above answers are "NO", then may proceed with Cephalosporin use.     ROS  As noted in HPI.   Physical Exam  BP 128/80 (BP Location: Left Arm)   Pulse 83   Temp 98.3 F (36.8 C) (Oral)   Resp 16   Ht 5\' 7"  (1.702 m)   Wt 184 lb (83.5 kg)   SpO2 100%   BMI 28.82 kg/m   Constitutional: Well developed, well nourished, no acute distress Eyes:  EOMI, conjunctiva normal bilaterally HENT: Normocephalic, atraumatic,mucus membranes moist extensive clear rhinorrhea.  No sinus tenderness.  No obvious postnasal drip. Respiratory: Poor inspiratory effort, lungs clear bilaterally.  Fair air movement.  No chest wall tenderness. Cardiovascular: Normal rate regular rhythm, positive faint systolic ejection murmur.  No rub, gallop. GI: nondistended skin: No rash, skin intact Musculoskeletal: Calves symmetric, nontender, no edema, no deformities Neurologic: Alert & oriented x 3, no focal neuro deficits Psychiatric: Speech and behavior appropriate   ED Course   Medications - No data to display  Orders Placed This Encounter  Procedures  . DG Chest 2 View    Standing Status:   Standing    Number of Occurrences:   1    Order Specific Question:   Reason for Exam (SYMPTOM  OR DIAGNOSIS REQUIRED)    Answer:   L sided CP with coughing r/o pna edema effusion    No results found for this or any previous visit (from the past 24  hour(s)). Dg  Chest 2 View  Result Date: 12/12/2017 CLINICAL DATA:  Left-sided mid rib and chest pain. EXAM: CHEST - 2 VIEW COMPARISON:  04/04/2016 FINDINGS: Cardiomediastinal silhouette is normal. Mediastinal contours appear intact. There is no evidence of focal airspace consolidation, pleural effusion or pneumothorax. Hyperinflation of the lungs. Osseous structures are without acute abnormality. Stable rib deformities on the right from healed rib fractures. Soft tissues are grossly normal. IMPRESSION: No evidence of lobar consolidation. Hyperinflation of the lungs usually associated with COPD. Electronically Signed   By: Fidela Salisbury M.D.   On: 12/12/2017 17:56    ED Clinical Impression  Cough   ED Assessment/Plan  Wife's primary concern is to rule out a pneumonia today.  Will check a chest x-ray.  He also has a murmur, but I do not think that he has CHF at this point in time causing his cough.  Do not think that his chest pain is of cardiac origin.   Imaging independently reviewed.  Hyperinflation.  No evidence of lobar consolidation.  No acute rib abnormalities.  See radiology report for details.  Plan to send home with Ladona Ridgel as the wife states that this is worked well for him in the past, albuterol inhaler with a spacer, a printed wait-and-see prescription of doxycycline which will cover sinus infection as well as pneumonia.  Continue Mucinex, Flonase, Tylenol.  Follow-up with primary care physician in several days, to the ER if he gets worse.  Wife states that they will notify his primary care physician of the faint murmur that I heard today.  Reviewed imaging independently.  Normal.  See radiology report for full details.  Discussed , MDM, treatment plan, and plan for follow-up with patient and spouse. Discussed sn/sx that should prompt return to the ED. they agree with plan.   Meds ordered this encounter  Medications  . doxycycline (VIBRAMYCIN) 100 MG capsule    Sig: Take 1  capsule (100 mg total) by mouth 2 (two) times daily for 7 days.    Dispense:  14 capsule    Refill:  0  . albuterol (PROVENTIL HFA;VENTOLIN HFA) 108 (90 Base) MCG/ACT inhaler    Sig: Inhale 1-2 puffs into the lungs every 6 (six) hours as needed for wheezing or shortness of breath.    Dispense:  1 Inhaler    Refill:  0  . benzonatate (TESSALON) 200 MG capsule    Sig: Take 1 capsule (200 mg total) by mouth 3 (three) times daily as needed for cough.    Dispense:  30 capsule    Refill:  0  . Spacer/Aero-Holding Chambers (AEROCHAMBER PLUS) inhaler    Sig: Use as instructed    Dispense:  1 each    Refill:  2    *This clinic note was created using Dragon dictation software. Therefore, there may be occasional mistakes despite careful proofreading.   ?   Melynda Ripple, MD 12/12/17 1810

## 2017-12-12 NOTE — Discharge Instructions (Addendum)
Continue the Mucinex, saline nasal irrigation, Flonase, Tylenol.  2 puffs from his albuterol inhaler every 4-6 hours as needed for coughing, increased work of breathing.  Make sure he uses a spacer.  This will make the medicine more effective.  The doxycycline will cover sinus infection in addition to pneumonia, so if he starts to get worse in the next few days, go ahead and start doxycycline . if he gets worse despite being on doxycycline for 24 hours, take him immediately to the emergency department.

## 2017-12-12 NOTE — ED Triage Notes (Signed)
Patient in today with his wife c/o cough x 2 days. Patient now c/o muscular chest pain with his cough. Patient denies fever.

## 2017-12-20 ENCOUNTER — Telehealth: Payer: Self-pay | Admitting: Neurology

## 2017-12-20 NOTE — Telephone Encounter (Signed)
Pts wife called stating that she has gotten worried due her husband worsening symptoms(Early onset Alzheimer). Pt has been "getting angry not violent but I'm getting very worried". Pts wife states that if need be she will take the pt to the ED and grateful for the appt. FYI for upcoming appt

## 2018-01-04 DIAGNOSIS — E876 Hypokalemia: Secondary | ICD-10-CM | POA: Diagnosis not present

## 2018-01-04 DIAGNOSIS — I25119 Atherosclerotic heart disease of native coronary artery with unspecified angina pectoris: Secondary | ICD-10-CM | POA: Diagnosis not present

## 2018-01-04 DIAGNOSIS — Z794 Long term (current) use of insulin: Secondary | ICD-10-CM | POA: Diagnosis not present

## 2018-01-04 DIAGNOSIS — K8689 Other specified diseases of pancreas: Secondary | ICD-10-CM | POA: Diagnosis not present

## 2018-01-04 DIAGNOSIS — R2689 Other abnormalities of gait and mobility: Secondary | ICD-10-CM | POA: Diagnosis not present

## 2018-01-04 DIAGNOSIS — E1165 Type 2 diabetes mellitus with hyperglycemia: Secondary | ICD-10-CM | POA: Diagnosis not present

## 2018-01-05 ENCOUNTER — Encounter: Payer: Self-pay | Admitting: Adult Health

## 2018-01-05 ENCOUNTER — Ambulatory Visit: Payer: PPO | Admitting: Adult Health

## 2018-01-05 ENCOUNTER — Telehealth: Payer: Self-pay | Admitting: Adult Health

## 2018-01-05 NOTE — Telephone Encounter (Signed)
Pt's wife called said he is very sick with diarrhea and is unable to come to appt today. She said he is very depressed.

## 2018-01-05 NOTE — Telephone Encounter (Signed)
Noted  

## 2018-01-09 ENCOUNTER — Encounter: Payer: Self-pay | Admitting: Adult Health

## 2018-01-09 ENCOUNTER — Ambulatory Visit (INDEPENDENT_AMBULATORY_CARE_PROVIDER_SITE_OTHER): Payer: PPO | Admitting: Adult Health

## 2018-01-09 VITALS — BP 114/60 | HR 72 | Ht 67.0 in | Wt 178.8 lb

## 2018-01-09 DIAGNOSIS — F0280 Dementia in other diseases classified elsewhere without behavioral disturbance: Secondary | ICD-10-CM | POA: Diagnosis not present

## 2018-01-09 DIAGNOSIS — G300 Alzheimer's disease with early onset: Secondary | ICD-10-CM

## 2018-01-09 DIAGNOSIS — K117 Disturbances of salivary secretion: Secondary | ICD-10-CM | POA: Diagnosis not present

## 2018-01-09 DIAGNOSIS — G479 Sleep disorder, unspecified: Secondary | ICD-10-CM

## 2018-01-09 MED ORDER — MEMANTINE HCL 10 MG PO TABS: 10.0000 mg | ORAL_TABLET | Freq: Two times a day (BID) | ORAL | 3 refills | Status: DC

## 2018-01-09 MED ORDER — DONEPEZIL HCL 10 MG PO TABS: 10.0000 mg | ORAL_TABLET | Freq: Two times a day (BID) | ORAL | 3 refills | Status: DC

## 2018-01-09 NOTE — Progress Notes (Signed)
PATIENT: Maurice Patterson DOB: Apr 14, 1956  REASON FOR VISIT: follow up- memory disturbance  HISTORY FROM: patient  HISTORY OF PRESENT ILLNESS: Today 01/09/18 Maurice Patterson is a 62 year old male with a history of memory disturbance.  He returns for follow-up.  His wife is with him today.  He is currently on Aricept and Namenda.  She reports that he complete all ADLs independently.  He does not operate a motor vehicle.  He is able to do some household chores such as washing clothes and putting away laundry.  Patient denies any hallucinations.  Denies any significant changes with his gait or balance.  The patient reports that he does have excessive drooling since his stroke.  Reports that he recently saw his primary care.  Lab work indicates that his potassium level was slightly low.  They have not talked to the PCP to determine if potassium tablets are necessary.  The patient also reports that he is having muscle cramps typically at bedtime.  This is affecting his sleep pattern.  Patient's wife states that he is up and down all night.  He does sleep a lot during the day.  Patient also reports that his PCP ordered physical therapy.  Patient returns today for evaluation.  HISTORY Interval history 04/27/2017: Patient is here for follow-up.PET scan was completed which showed decreased relative cortical metabolism in the biparietal lobes and temporal lobes is a pattern suggestive of Alzheimer's type pathology. Patient is currently on Aricept 10 mg daily. He has a past medical history of obesity, cognitive decline previously seen by [facility] for years without a diagnosis, seizures on Lamictal, TIA/strokes on aspirin and Plavix, pancreatitis status post stent, hypertension, hyperlipidemia, diabetes, congestive heart failure, coronary artery disease, acute MI, back pain. Patient was last seen May 2018 when Namenda was added. Patient called in August of this year complaining of new onset daily headache for the past 4  years that he was crying with the head pain, we advised him to go to the emergency room but they did not comply and we ordered a CT of the head which showed no acute event. Gabapentin was started at low dose to see if it would help the headaches. They have kidney surgery next week. Headaches have gotten better. They enjoyed disney.   In 01/2015 was started on Keppra due to EEG findings and unknown reason for confusion.  01/23/2015 -Personally interpreted, as below. 3-4 HOUR EEG IMPRESSION: This 3-4 hour EEG in the awake and asleep states demonstrated occasional left temporal epileptiform discharges. Personally reviewed and interpreted.     Labs: Vitamin D was extremely low daily supplementation 2000 international units daily was recommended on 10/21/2016. We had ordered an FDG PET scan which was canceled because patient's wife was in the hospital. PET scan was completed which showed decreased relative cortical metabolism in the biparietal lobes and temporal lobes is a pattern suggestive of Alzheimer's type pathology.   REVIEW OF SYSTEMS: Out of a complete 14 system review of symptoms, the patient complains only of the following symptoms, and all other reviewed systems are negative.  ALLERGIES: Allergies  Allergen Reactions  . Hydrocodone Anaphylaxis  . Morphine Other (See Comments)    Loss of memory  . Ambien [Zolpidem] Other (See Comments)    delirium    . Brilinta [Ticagrelor] Other (See Comments)    Stroke   . Flexeril [Cyclobenzaprine] Other (See Comments)    delerium   . Flunitrazepam Other (See Comments)    ROHYPNOL (hallucinations)  .  Haldol [Haloperidol Lactate] Other (See Comments)    delerium   . Levetiracetam Diarrhea and Other (See Comments)    Unable to walk  . Lorazepam Hives  . Risperdal [Risperidone] Other (See Comments)    Delirium   . Trazodone Other (See Comments)    Delirium Can take in low doses   . Benadryl [Diphenhydramine Hcl (Sleep)] Rash  .  Penicillins Rash    Mouth ulcers Has patient had a PCN reaction causing immediate rash, facial/tongue/throat swelling, SOB or lightheadedness with hypotension: Yes Has patient had a PCN reaction causing severe rash involving mucus membranes or skin necrosis: No Has patient had a PCN reaction that required hospitalization No Has patient had a PCN reaction occurring within the last 10 years: Yes If all of the above answers are "NO", then may proceed with Cephalosporin use.    HOME MEDICATIONS: Outpatient Medications Prior to Visit  Medication Sig Dispense Refill  . acetaminophen (TYLENOL) 500 MG tablet Take 500 mg by mouth 3 (three) times daily.    Marland Kitchen aspirin EC 81 MG tablet Take 81 mg by mouth daily.    Marland Kitchen atorvastatin (LIPITOR) 80 MG tablet Take 80 mg by mouth daily.    . cholecalciferol (VITAMIN D) 1000 units tablet Take 1,000 Units by mouth daily.    . clopidogrel (PLAVIX) 75 MG tablet Take 75 mg by mouth daily.     . colestipol (COLESTID) 1 g tablet Take 2 g by mouth daily at 12 noon.    . diphenoxylate-atropine (LOMOTIL) 2.5-0.025 MG tablet Take 1 tablet by mouth 3 (three) times daily as needed for diarrhea or loose stools.    . donepezil (ARICEPT) 10 MG tablet Take 1 tablet (10 mg total) by mouth 2 (two) times daily. 180 tablet 6  . furosemide (LASIX) 40 MG tablet Take 40 mg by mouth at bedtime.    . insulin NPH Human (HUMULIN N,NOVOLIN N) 100 UNIT/ML injection Inject 0.25 mLs (25 Units total) into the skin 2 (two) times daily. (Patient taking differently: Inject 30 Units into the skin 2 (two) times daily. ) 10 mL 11  . insulin regular (NOVOLIN R,HUMULIN R) 100 units/mL injection Inject 0.3 mLs (30 Units total) into the skin 3 (three) times daily with meals. (Patient taking differently: Inject 34 Units into the skin 3 (three) times daily before meals. 34 units subcutaneous 3 times daily with meals with an additional sliding scale of 8 units added with every 20 glucose reading over 200.) 10  mL 1  . isosorbide mononitrate (IMDUR) 30 MG 24 hr tablet Take 30 mg by mouth daily.     Marland Kitchen lamoTRIgine (LAMICTAL) 100 MG tablet Take 100 mg by mouth 2 (two) times daily.    Marland Kitchen lidocaine (LIDODERM) 5 % Place 1 patch onto the skin daily as needed (pain). Remove & Discard patch within 12 hours or as directed by MD (APPLIED TO PATIENT BACK FOR PAIN)    . losartan (COZAAR) 100 MG tablet Take 100 mg by mouth daily.    . memantine (NAMENDA) 10 MG tablet Take 1 tablet (10 mg total) by mouth 2 (two) times daily. 180 tablet 3  . metoprolol succinate (TOPROL-XL) 100 MG 24 hr tablet Take 100 mg by mouth daily. Take with or immediately following a meal.    . nitroGLYCERIN (NITROSTAT) 0.4 MG SL tablet Place 1 tablet under the tongue every 5 (five) minutes as needed for chest pain.     . Pancrelipase, Lip-Prot-Amyl, (ZENPEP) 25000 units CPEP Take 50,000  Units by mouth 3 (three) times daily with meals.    . pantoprazole (PROTONIX) 40 MG tablet Take 40 mg by mouth at bedtime.     . potassium chloride (K-DUR,KLOR-CON) 10 MEQ tablet Take 10 mEq by mouth 2 (two) times daily.    Marland Kitchen albuterol (PROVENTIL HFA;VENTOLIN HFA) 108 (90 Base) MCG/ACT inhaler Inhale 1-2 puffs into the lungs every 6 (six) hours as needed for wheezing or shortness of breath. (Patient not taking: Reported on 01/09/2018) 1 Inhaler 0  . benzonatate (TESSALON) 200 MG capsule Take 1 capsule (200 mg total) by mouth 3 (three) times daily as needed for cough. (Patient not taking: Reported on 01/09/2018) 30 capsule 0  . Spacer/Aero-Holding Chambers (AEROCHAMBER PLUS) inhaler Use as instructed (Patient not taking: Reported on 01/09/2018) 1 each 2   No facility-administered medications prior to visit.     PAST MEDICAL HISTORY: Past Medical History:  Diagnosis Date  . Acute MI ([place])   . Arthritis   . Back pain   . CAD (coronary artery disease)   . CHF (congestive heart failure) ([place])   . Chronic kidney disease   . Degenerative lumbar disc   . Dementia     . Diabetes mellitus without complication ([place])   . GERD (gastroesophageal reflux disease)   . Hernia of abdominal cavity   . Hyperlipemia   . Hypertension   . Malignant intraductal papillary mucinous tumor of pancreas ([place])   . Memory loss   . Pancreatitis   . Seizures ([place])    staring spells  . Shingles   . Stroke ([place]) 08/2017  . TIA (transient ischemic attack)     PAST SURGICAL HISTORY: Past Surgical History:  Procedure Laterality Date  . CARDIAC CATHETERIZATION    . CORONARY ANGIOPLASTY    . CYSTOSCOPY WITH STENT PLACEMENT Right 05/13/2017   Procedure: CYSTOSCOPY WITH STENT PLACEMENT;  Surgeon: Nickie Retort, MD;  Location: [facility] ORS;  Service: Urology;  Laterality: Right;  . ERCP N/A 09/12/2015   Procedure: ENDOSCOPIC RETROGRADE CHOLANGIOPANCREATOGRAPHY (ERCP);  Surgeon: Hulen Luster, MD;  Location: [facility] ENDOSCOPY;  Service: Gastroenterology;  Laterality: N/A;  . ERCP N/A 01/02/2016   Procedure: ENDOSCOPIC RETROGRADE CHOLANGIOPANCREATOGRAPHY (ERCP);  Surgeon: Hulen Luster, MD;  Location: [facility] ENDOSCOPY;  Service: Gastroenterology;  Laterality: N/A;  . EXTERNAL FIXATION WRIST FRACTURE Left   . left arm metal plate    . LEFT HEART CATH AND CORONARY ANGIOGRAPHY Left 11/04/2016   Procedure: Left Heart Cath and Coronary Angiography;  Surgeon: Isaias Cowman, MD;  Location: [facility];  Service: Cardiovascular;  Laterality: Left;  . Left shoulder surgery    . Stent times 3  2013   Cardiac  . TOTAL ELBOW REPLACEMENT Left   . VASECTOMY    . VENTRICULOPERITONEAL SHUNT      FAMILY HISTORY: Family History  Problem Relation Age of Onset  . Diabetes Mother   . Hypertension Mother   . CAD Mother   . Hyperlipidemia Mother   . Stroke Mother   . ALS Mother   . Alzheimer's disease Father   . Diabetes Father   . Heart Problems Brother     SOCIAL HISTORY: Social History   Socioeconomic History  . Marital status: Married    Spouse name: Not on file  . Number of  children: 2  . Years of education: BS  . Highest education level: Not on file  Occupational History  . Occupation: Disabled  Social Needs  . Financial resource strain: Not  on file  . Food insecurity:    Worry: Not on file    Inability: Not on file  . Transportation needs:    Medical: Not on file    Non-medical: Not on file  Tobacco Use  . Smoking status: Former Smoker    Last attempt to quit: 2013    Years since quitting: 6.3  . Smokeless tobacco: Never Used  . Tobacco comment: used to smoke 2PD for 40 yrs, quit about 4 years ago  Substance and Sexual Activity  . Alcohol use: No    Alcohol/week: 0.0 oz    Comment: occasional  . Drug use: No  . Sexual activity: Not on file  Lifestyle  . Physical activity:    Days per week: Not on file    Minutes per session: Not on file  . Stress: Not on file  Relationships  . Social connections:    Talks on phone: Not on file    Gets together: Not on file    Attends religious service: Not on file    Active member of club or organization: Not on file    Attends meetings of clubs or organizations: Not on file    Relationship status: Not on file  . Intimate partner violence:    Fear of current or ex partner: Not on file    Emotionally abused: Not on file    Physically abused: Not on file    Forced sexual activity: Not on file  Other Topics Concern  . Not on file  Social History Narrative   Lives at home with wife and son. Ambulatory at baseline.   Right-handed   Caffeine: 2 sodas per day      PHYSICAL EXAM  Vitals:   01/09/18 1351  BP: 114/60  Pulse: 72  Weight: 178 lb 12.8 oz (81.1 kg)  Height: 5\' 7"  (1.702 m)   Body mass index is 28 kg/m.   MMSE - Mini Mental State Exam 01/09/2018 12/28/2016 10/20/2016  Orientation to time 1 0 2  Orientation to Place 3 1 2   Registration 3 3 3   Attention/ Calculation 0 2 1  Recall 1 0 2  Language- name 2 objects 2 2 2   Language- repeat 0 0 1  Language- follow 3 step command 3 0 2    Language- read & follow direction 1 1 1   Write a sentence 0 1 1  Copy design 0 0 0  Total score 14 10 17      Generalized: Well developed, in no acute distress   Neurological examination  Mentation: Alert oriented to time, place, history taking. Follows all commands speech and language fluent Cranial nerve II-XII: Pupils were equal round reactive to light. Extraocular movements were full, visual field were full on confrontational test. Facial sensation and strength were normal. Uvula tongue midline. Head turning and shoulder shrug  were normal and symmetric. Motor: The motor testing reveals 5 over 5 strength of all 4 extremities. Good symmetric motor tone is noted throughout.  Sensory: Sensory testing is intact to soft touch on all 4 extremities. No evidence of extinction is noted.  Coordination: Cerebellar testing reveals good finger-nose-finger and heel-to-shin bilaterally.  Gait and station: Gait is slightly unsteady.  Tandem gait not attempted. Reflexes: Deep tendon reflexes are symmetric and normal bilaterally.   DIAGNOSTIC DATA (LABS, IMAGING, TESTING) - I reviewed patient records, labs, notes, testing and imaging myself where available.  Lab Results  Component Value Date   WBC 8.0 10/20/2016   HGB  14.3 05/13/2017   HCT 42.0 05/13/2017   MCV 93 10/20/2016   PLT 221 10/20/2016      Component Value Date/Time   NA 141 05/13/2017 0757   NA 146 (H) 10/20/2016 0944   NA 138 12/17/2014 0435   K 3.2 (L) 05/13/2017 0757   K 3.7 12/17/2014 1521   CL 106 05/02/2017 0846   CL 104 12/17/2014 0435   CO2 29 05/02/2017 0846   CO2 28 12/17/2014 0435   GLUCOSE 168 (H) 05/13/2017 0757   GLUCOSE 293 (H) 12/17/2014 0435   BUN 11 05/02/2017 0846   BUN 7 10/20/2016 0944   BUN 9 12/17/2014 0435   CREATININE 0.72 05/02/2017 0846   CREATININE 0.79 12/17/2014 0435   CALCIUM 8.8 (L) 05/02/2017 0846   CALCIUM 7.9 (L) 12/17/2014 0435   PROT 6.8 10/20/2016 0944   PROT 6.3 (L) 12/17/2014  0435   ALBUMIN 4.5 10/20/2016 0944   ALBUMIN 3.2 (L) 12/17/2014 0435   AST 22 10/20/2016 0944   AST 45 (H) 12/17/2014 0435   ALT 19 10/20/2016 0944   ALT 47 12/17/2014 0435   ALKPHOS 191 (H) 10/20/2016 0944   ALKPHOS 178 (H) 12/17/2014 0435   BILITOT 0.4 10/20/2016 0944   BILITOT 0.4 12/17/2014 0435   GFRNONAA >60 05/02/2017 0846   GFRNONAA >60 12/17/2014 0435   GFRAA >60 05/02/2017 0846   GFRAA >60 12/17/2014 0435   Lab Results  Component Value Date   CHOL 65 04/05/2016   HDL 37 (L) 04/05/2016   LDLCALC 21 04/05/2016   TRIG 35 04/05/2016   CHOLHDL 1.8 04/05/2016   Lab Results  Component Value Date   HGBA1C 8.9 (H) 04/04/2016   Lab Results  Component Value Date   VITAMINB12 530 10/20/2016   Lab Results  Component Value Date   TSH 1.150 10/20/2016      ASSESSMENT AND PLAN 62 y.o. year old male  has a past medical history of Acute MI ([place]), Arthritis, Back pain, CAD (coronary artery disease), CHF (congestive heart failure) ([place]), Chronic kidney disease, Degenerative lumbar disc, Dementia, Diabetes mellitus without complication ([place]), GERD (gastroesophageal reflux disease), Hernia of abdominal cavity, Hyperlipemia, Hypertension, Malignant intraductal papillary mucinous tumor of pancreas ([place]), Memory loss, Pancreatitis, Seizures ([place]), Shingles, Stroke ([place]) (08/2017), and TIA (transient ischemic attack). here with:  1.  Memory disturbance 2.  Drooling 3.  Sleep disturbance  The patient's memory score has improved since the last visit.  He will continue on Aricept and Namenda.  I advised that I will discuss with Dr. Krista Blue about Botox injections for drooling.  Advised that the patient should avoid taking long naps during the day.  We could potentially try a muscle relaxer at bedtime to help with muscle cramps.  However we will wait to see if low potassium level is treated.  Advised that if his symptoms worsen or he develops new symptoms they should let us know.  He will  follow-up in 6 months or sooner if needed.    Ward Givens, MSN, NP-C 01/09/2018, 2:11 PM [facility] [address], [place], [place] [address] [phone]

## 2018-01-09 NOTE — Patient Instructions (Signed)
Your Plan:  Continue Aricept and Namenda May start Baclofen depending on PCP If your symptoms worsen or you develop new symptoms please let us know.   Thank you for coming to see [place] at [facility]. I hope we have been able to provide you high quality care today.  You may receive a patient satisfaction survey over the next few weeks. We would appreciate your feedback and comments so that we may continue to improve ourselves and the health of our patients.

## 2018-01-10 ENCOUNTER — Telehealth: Payer: Self-pay | Admitting: Adult Health

## 2018-01-10 NOTE — Progress Notes (Signed)
Personally  participated in, made any corrections needed, and agree with history, physical, neuro exam,assessment and plan as stated above.    Antonia Ahern, MD [facility] 

## 2018-01-10 NOTE — Telephone Encounter (Signed)
Pt's wife called said she has spoken with his insurance company and they said the botox would be approved for the facial drooling and also migraines from shingles in the head. She said Jinny Blossom needs to leg spasms to help with sleep. Please call to advise

## 2018-01-11 MED ORDER — BACLOFEN 10 MG PO TABS
10.0000 mg | ORAL_TABLET | Freq: Every day | ORAL | 5 refills | Status: DC
Start: 2018-01-11 — End: 2018-05-18

## 2018-01-11 NOTE — Telephone Encounter (Signed)
Pt's wife said he hasn't slept in days bc of restless legs. She said PCP is out of the office and potassium will not be filled. She said Jinny Blossom said she prescribe a muscle relaxer to help him sleep. She said he is very grumpy and loud today. Please call to advise

## 2018-01-11 NOTE — Telephone Encounter (Signed)
Attempted to reach wife, Cecille Rubin on [place] , no answer and voice mailbox full. LVM on home # listed on DPR requesting call back to clarify part of message sent to this RN yesterday.

## 2018-01-11 NOTE — Addendum Note (Signed)
Addended by: Trudie Buckler on: 01/11/2018 04:30 PM   Modules accepted: Orders

## 2018-01-11 NOTE — Telephone Encounter (Signed)
Baclofen 10 mg at bedtime ordered. Discussed this medication in the OV. Can cause drowsiness.

## 2018-01-11 NOTE — Telephone Encounter (Addendum)
Spoke with wife, Cecille Rubin and advised her that this RN noted Dr Astrid Divine, [facility] refilled Potassium on 01/04/18. Wife stated that she diid, but she did not increase his dose; she refilled previous dose. She stated the patient continues to take it.  Potassium level was checked on 01/04/18, 3.4. Wife is asking for a muscle relaxer to help patient sleep.  This RN advised will route her request to NP, confirmed pharmacy. She verbalized understanding, appreciation.

## 2018-01-12 ENCOUNTER — Other Ambulatory Visit: Payer: Self-pay | Admitting: Adult Health

## 2018-01-17 NOTE — Telephone Encounter (Signed)
Last OV note mentions that botox would be discussed with Dr. Krista Blue. Ok to proceed with getting botox approved for patient or does this still need to be discussed?

## 2018-01-17 NOTE — Telephone Encounter (Signed)
I need to discuss with Dr. Krista Blue

## 2018-01-17 NOTE — Telephone Encounter (Signed)
I spoke with Jinny Blossom, NP and she has discussed this with Dr. Krista Blue. Dr. Krista Blue would like to see patient for a consultation before going forward with botox.

## 2018-01-17 NOTE — Telephone Encounter (Signed)
Pts wife requesting a call back to discuss getting the pt started with botox injections, please call to advise

## 2018-01-18 NOTE — Telephone Encounter (Signed)
I spoke with patient's wife and made her aware that Dr. Krista Blue would like to see him for a consultation. She is agreeable and we can either do 02/08/18 or 02/09/18 at 1:00p. She needs to call her daughter to ask which of those days is best and will call right back to let us know. Those spots are on hold.

## 2018-01-18 NOTE — Telephone Encounter (Signed)
Scheduled patient with Dr. Krista Blue on 02-08-18 at 1pm per previous message. If there is a 3pm available patient's wife Cecille Rubin would like to be called.

## 2018-01-19 NOTE — Telephone Encounter (Signed)
Dr. Krista Blue had a spot open up on 01/30/18 at 3:30p so I called patient's wife and offered this day and time to her and she was able to take it. Patient is scheduled and Cecille Rubin is aware of date and arrival time of 3:00p.

## 2018-01-25 ENCOUNTER — Other Ambulatory Visit: Payer: Self-pay

## 2018-01-25 ENCOUNTER — Encounter: Payer: Self-pay | Admitting: Physical Therapy

## 2018-01-25 ENCOUNTER — Ambulatory Visit: Payer: PPO | Attending: Family Medicine | Admitting: Physical Therapy

## 2018-01-25 DIAGNOSIS — R41841 Cognitive communication deficit: Secondary | ICD-10-CM | POA: Insufficient documentation

## 2018-01-25 DIAGNOSIS — R4701 Aphasia: Secondary | ICD-10-CM | POA: Diagnosis not present

## 2018-01-25 DIAGNOSIS — M6281 Muscle weakness (generalized): Secondary | ICD-10-CM | POA: Diagnosis not present

## 2018-01-25 DIAGNOSIS — R278 Other lack of coordination: Secondary | ICD-10-CM | POA: Diagnosis not present

## 2018-01-25 DIAGNOSIS — R471 Dysarthria and anarthria: Secondary | ICD-10-CM | POA: Diagnosis not present

## 2018-01-25 DIAGNOSIS — R2681 Unsteadiness on feet: Secondary | ICD-10-CM | POA: Diagnosis not present

## 2018-01-25 DIAGNOSIS — R262 Difficulty in walking, not elsewhere classified: Secondary | ICD-10-CM | POA: Insufficient documentation

## 2018-01-25 NOTE — Therapy (Signed)
[place] MAIN [facility] SERVICES [address] [place], [place], [address] Phone: [phone]   Fax:  [phone]  Physical Therapy Evaluation  Patient Details  Name: Maurice [place] MRN: [id number] Date of Birth: July 24, 1956 Referring Provider: [name], MD   Encounter Date: 01/25/2018  PT End of Session - 01/25/18 1748    Visit Number  1    Number of Visits  13    Date for PT Re-Evaluation  03/08/18    PT Start Time  3710    PT Stop Time  6269    PT Time Calculation (min)  58 min    Equipment Utilized During Treatment  Gait belt    Activity Tolerance  Patient limited by fatigue;Patient limited by pain    Behavior During Therapy  [facility] for tasks assessed/performed       Past Medical History:  Diagnosis Date  . Acute MI ([place])   . Arthritis   . Back pain   . CAD (coronary artery disease)   . CHF (congestive heart failure) ([place])   . Chronic kidney disease   . Degenerative lumbar disc   . Dementia   . Diabetes mellitus without complication ([place])   . GERD (gastroesophageal reflux disease)   . Hernia of abdominal cavity   . Hyperlipemia   . Hypertension   . Malignant intraductal papillary mucinous tumor of pancreas ([place])   . Memory loss   . Pancreatitis   . Seizures ([place])    staring spells  . Shingles   . Stroke ([place]) 08/2017  . TIA (transient ischemic attack)     Past Surgical History:  Procedure Laterality Date  . CARDIAC CATHETERIZATION    . CORONARY ANGIOPLASTY    . CYSTOSCOPY WITH STENT PLACEMENT Right 05/13/2017   Procedure: CYSTOSCOPY WITH STENT PLACEMENT;  Surgeon: Nickie Retort, MD;  Location: [facility] ORS;  Service: Urology;  Laterality: Right;  . ERCP N/A 09/12/2015   Procedure: ENDOSCOPIC RETROGRADE CHOLANGIOPANCREATOGRAPHY (ERCP);  Surgeon: Hulen Luster, MD;  Location: [facility] ENDOSCOPY;  Service: Gastroenterology;  Laterality: N/A;  . ERCP N/A 01/02/2016   Procedure: ENDOSCOPIC RETROGRADE CHOLANGIOPANCREATOGRAPHY  (ERCP);  Surgeon: Hulen Luster, MD;  Location: [facility] ENDOSCOPY;  Service: Gastroenterology;  Laterality: N/A;  . EXTERNAL FIXATION WRIST FRACTURE Left   . left arm metal plate    . LEFT HEART CATH AND CORONARY ANGIOGRAPHY Left 11/04/2016   Procedure: Left Heart Cath and Coronary Angiography;  Surgeon: Isaias Cowman, MD;  Location: [place] CV LAB;  Service: Cardiovascular;  Laterality: Left;  . Left shoulder surgery    . Stent times 3  2013   Cardiac  . TOTAL ELBOW REPLACEMENT Left   . VASECTOMY    . VENTRICULOPERITONEAL SHUNT      There were no vitals filed for this visit.   Subjective Assessment - 01/25/18 1653    Subjective  "I am having a harder time getting up."     Pertinent History  62 yo Male s/p multiple strokes (most recent in Jan 2019) with impaired memory with early onset alzheimer's dementia. Patient has had PT back in 2017 to work on balance and gait with good results. He reports recently (last few months) he has had trouble getting up and moving around. He denies any recent falls. Family reports that per physcian if he didn't come to PT he would be wheelchair bound within 6 months. Patient alone during history intake which limited our information. He does present to therapy without  AD. He reports catching himself often. He is not driving right now.     Limitations  Walking    How long can you sit comfortably?  NA    How long can you stand comfortably?  10-15 min;     How long can you walk comfortably?  about 400-500 feet with increased left hip/back pain;     Diagnostic tests  MRI has been ordered;     Currently in Pain?  Yes    Pain Score  3     Pain Location  Back    Pain Orientation  Left;Lower    Pain Descriptors / Indicators  Aching;Sore    Pain Type  Chronic pain    Pain Radiating Towards  none    Pain Onset  More than a month ago    Pain Frequency  Intermittent    Aggravating Factors   prolonged walking, bending over, prolonged sitting;     Pain Relieving  Factors  rest    Effect of Pain on Daily Activities  decreased activity tolerance;     Multiple Pain Sites  No         [facility] PT Assessment - 01/25/18 0001      Assessment   Medical Diagnosis  Balance disorder    Referring Provider  [name], MD    Onset Date/Surgical Date  08/23/17    Hand Dominance  Right    Next MD Visit  next month    Prior Therapy  had PT in 2017 following a stroke with good results; denies any recent therpay for this condition;       Precautions   Precautions  Fall      Restrictions   Weight Bearing Restrictions  No      Balance Screen   Has the patient fallen in the past 6 months  Yes    How many times?  1    Has the patient had a decrease in activity level because of a fear of falling?   Yes    Is the patient reluctant to leave their home because of a fear of falling?   No      Home Environment   Additional Comments  lives with wife/son; has 1-2 steps to enter/exit home with no rails; single story home;       Prior Function   Level of Independence  Independent with basic ADLs;Independent with gait    Vocation  Retired    Leisure  watch TV-       Cognition   Overall Cognitive Status  Impaired/Different from baseline    Area of Impairment  Memory;Attention;Safety/judgement;Following commands    Following Commands  Follows one step commands inconsistently    Safety/Judgement  Decreased awareness of safety;Decreased awareness of deficits      Observation/Other Assessments   Observations  pleasant gentleman, mumbles often and is easily distracted;       Sensation   Light Touch  -- patient confused during light touch assessment, unclear;       Coordination   Gross Motor Movements are Fluid and Coordinated  Yes    Fine Motor Movements are Fluid and Coordinated  No      Posture/Postural Control   Posture Comments  exhibits erect posture, functional      AROM   Overall AROM Comments  limited hip flexion against gravity otherwise ROM is  WFL;       Strength   Overall Strength Comments  hip: R: 4/5, L:  3+/5, knee extension: R: 4/5, L: 4-/5, knee flexion: R: 4+/5  L: 3/5, ankle DF: R: 3/5   L: 3/5      Palpation   Palpation comment  denies any tenderness to palpation;       Transfers   Comments  requires UE to push up from chair, able to stand up with CGA to supervision; reports increased low back pain upon initial standing;       Ambulation/Gait   Gait Comments  ambulates without AD, reciprocal gait pattern, wide base of support, decreased step length (shuffled steps with heel dragging) with increased lateral lean;       Standardized Balance Assessment   Five times sit to stand comments   13 sec without HHA (<15 sec indicates low fall risk)    10 Meter Walk  0.69 m/s without AD indicating limited community ambulator and increased risk for falls;       Berg Balance Test   Sit to Stand  Able to stand without using hands and stabilize independently    Standing Unsupported  Able to stand safely 2 minutes    Sitting with Back Unsupported but Feet Supported on Floor or Stool  Able to sit safely and securely 2 minutes    Stand to Sit  Sits safely with minimal use of hands    Transfers  Able to transfer safely, minor use of hands    Standing Unsupported with Eyes Closed  Able to stand 10 seconds with supervision    Standing Ubsupported with Feet Together  Able to place feet together independently and stand for 1 minute with supervision    From Standing, Reach Forward with Outstretched Arm  Can reach forward >5 cm safely (2")    From Standing Position, Pick up Object from [place] to pick up shoe, needs supervision    From Standing Position, Turn to Look Behind Over each Shoulder  Looks behind one side only/other side shows less weight shift    Turn 360 Degrees  Needs close supervision or verbal cueing    Standing Unsupported, Alternately Place Feet on Step/Stool  Needs assistance to keep from falling or unable to try     Standing Unsupported, One Foot in Front  Able to take small step independently and hold 30 seconds    Standing on One Leg  Unable to try or needs assist to prevent fall    Total Score  37    Berg comment:  80% risk for falls;       High Level Balance   High Level Balance Comments  able to stand unsupported, independently; dynamic balance is fair requiring CGA                Objective measurements completed on examination: See above findings.              PT Education - 01/25/18 1748    Education Details  plan of care;     Person(s) Educated  Patient    Methods  Explanation    Comprehension  Verbalized understanding       PT Short Term Goals - 01/25/18 1756      PT SHORT TERM GOAL #1   Title  Patient will increase 10 meter walk test to >1.45m/s as to improve gait speed for better community ambulation and to reduce fall risk.    Time  4    Period  Weeks    Status  New    Target Date  02/22/18      PT SHORT TERM GOAL #2   Title  Patient will increase BLE gross strength to 4+/5 as to improve functional strength for independent gait, increased standing tolerance and increased ADL ability.    Time  4    Period  Weeks    Status  New    Target Date  02/22/18        PT Long Term Goals - 01/25/18 1758      PT LONG TERM GOAL #1   Title  Patient will increase Berg Balance score by > 6 points to demonstrate decreased fall risk during functional activities.    Time  6    Period  Weeks    Status  New    Target Date  03/08/18      PT LONG TERM GOAL #2   Title  Patient will be adherent to HEP at least 3x a week to improve functional strength and balance for better safety at home.    Time  6    Period  Weeks    Status  New    Target Date  03/08/18      PT LONG TERM GOAL #3   Title  Patient will be accurate with finger to nose pointing x5 reps to improve fine motor coordination for grasping and using objects in UE when in community such as out to dinner.      Time  6    Period  Weeks    Status  New    Target Date  03/08/18      PT LONG TERM GOAL #4   Title  Patient will be require no assist with ascend/descend 3 steps using Least restrictive assistive device.    Time  6    Period  Weeks    Status  New    Target Date  03/08/18             Plan - 01/25/18 1749    Clinical Impression Statement  62 yo Male diagnosed with early onset Alzheimers with history of multiple CVAs presents with balance disorder. Patient does exhibit increased weakness in BLE (L>R) particularly in hip and ankle. He is able to transfer independently but reports increased low back pain. Per son, patient has been experiencing back pain for last 20 years related to OA and that should not be contributing to his imbalance or decreased mobility. Patient does ambulate without AD with supervision exhibiting wide base of support, decreased step length with increased heel shuffle. He would benefit from additional skilled PT intervention to improve strength, balance and mobility;     History and Personal Factors relevant to plan of care:  impaired memory due to early onset alzheimers, good caregiver support, 1-2 steps to enter at home without rails, recent falls, older in age    Clinical Presentation  Unstable    Clinical Presentation due to:  recent falls, impaired memory and poor safety awareness contribute to unstable presentation;     Clinical Decision Making  High    Rehab Potential  Fair    Clinical Impairments Affecting Rehab Potential  concerned about progression of early onset alzheimers and how that affects mobility;     PT Frequency  2x / week    PT Duration  6 weeks    PT Treatment/Interventions  ADLs/Self Care Home Management;Cryotherapy;Moist Heat;Therapeutic exercise;Therapeutic activities;Functional mobility training;Stair training;Gait training;DME Instruction;Balance training;Neuromuscular re-education;Patient/family education;Energy conservation    PT Next Visit  Plan  address HEP  PT Home Exercise Plan  will address next visit    Consulted and Agree with Plan of Care  Patient;Family member/caregiver    Family Member Consulted  son       Patient will benefit from skilled therapeutic intervention in order to improve the following deficits and impairments:  Abnormal gait, Decreased endurance, Decreased knowledge of precautions, Decreased activity tolerance, Decreased strength, Pain, Difficulty walking, Decreased mobility, Decreased balance, Decreased safety awareness  Visit Diagnosis: Muscle weakness (generalized)  Difficulty in walking, not elsewhere classified  Unsteadiness on feet     Problem List Patient Active Problem List   Diagnosis Date Noted  . Alzheimer's dementia 05/01/2017  . Cognitive decline 10/20/2016  . Chest pain, rule out acute myocardial infarction 04/05/2016  . Confusion 10/18/2015  . Constipation 08/23/2015  . Uncontrolled type 2 diabetes mellitus with hyperglycemia, with long-term current use of insulin ([place]) 08/23/2015  . Essential hypertension 08/23/2015  . Right lower lobe pneumonia ([place]) 08/23/2015  . Abdominal pain 08/21/2015  . Altered mental status 02/19/2015    Sereniti Wan PT, DPT 01/25/2018, 6:03 PM  [place] MAIN [facility] SERVICES [address] [place], [place], [address] Phone: [phone]   Fax:  [phone]  Name: Maurice [place] MRN: [id number] Date of Birth: 04-23-1956

## 2018-01-30 ENCOUNTER — Ambulatory Visit (INDEPENDENT_AMBULATORY_CARE_PROVIDER_SITE_OTHER): Payer: PPO | Admitting: Neurology

## 2018-01-30 ENCOUNTER — Encounter: Payer: Self-pay | Admitting: Neurology

## 2018-01-30 ENCOUNTER — Telehealth: Payer: Self-pay | Admitting: Neurology

## 2018-01-30 ENCOUNTER — Encounter: Payer: Self-pay | Admitting: *Deleted

## 2018-01-30 DIAGNOSIS — K117 Disturbances of salivary secretion: Secondary | ICD-10-CM

## 2018-01-30 NOTE — Progress Notes (Signed)
PATIENT: Maurice Patterson DOB: [birth year]  Chief Complaint  Patient presents with  . Drooling    He is here with his daughter, Joelene Millin, for Botox consultation to treat his excessive drooling (referred by Ward Givens, NP).     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 [place] noted to have gradual onset memory loss since 2014, gradually getting worse, recent office visit on Jan 09, 2018 Mini-Mental Status Examination was only 14/30,  He was also noted to have gradual onset gait abnormality, he is still able to dressing, feed himself, but has bowel and bladder accidents sometimes wearing diaper, in the last couple years, he also noticed gradual onset slow worsening excessive drooling, now holding tissues in his hand all the time, drooling on the floor, wet his pillow when woke up  REVIEW OF SYSTEMS: Full 14 system review of systems performed and notable only for memory loss,  ALLERGIES: Allergies  Allergen Reactions  . Hydrocodone Anaphylaxis  . Morphine Other (See Comments)    Loss of memory  . Ambien [Zolpidem] Other (See Comments)    delirium    . Brilinta [Ticagrelor] Other (See Comments)    Stroke   . Flexeril [Cyclobenzaprine] Other (See Comments)    delerium   . Flunitrazepam Other (See Comments)    ROHYPNOL (hallucinations)  . Haldol [Haloperidol Lactate] Other (See Comments)    delerium   . Levetiracetam Diarrhea and Other (See Comments)    Unable to walk  . Lorazepam Hives  . Risperdal [Risperidone] Other (See Comments)    Delirium   . Trazodone Other (See Comments)    Delirium Can take in low doses   . Benadryl [Diphenhydramine Hcl (Sleep)] Rash  . Penicillins Rash    Mouth ulcers Has patient had a PCN reaction causing  immediate rash, facial/tongue/throat swelling, SOB or lightheadedness with hypotension: Yes Has patient had a PCN reaction causing severe rash involving mucus membranes or skin necrosis: No Has patient had a PCN reaction that required hospitalization No Has patient had a PCN reaction occurring within the last 10 years: Yes If all of the above answers are "NO", then may proceed with Cephalosporin use.    HOME MEDICATIONS: Current Outpatient Medications  Medication Sig Dispense Refill  . acetaminophen (TYLENOL) 500 MG tablet Take 500 mg by mouth 3 (three) times daily.    Marland Kitchen aspirin EC 81 MG tablet Take 81 mg by mouth daily.    Marland Kitchen atorvastatin (LIPITOR) 80 MG tablet Take 80 mg by mouth daily.    . baclofen (LIORESAL) 10 MG tablet Take 1 tablet (10 mg total) by mouth at bedtime. 30 each 5  . cholecalciferol (VITAMIN D) 1000 units tablet Take 1,000 Units by mouth daily.    . clopidogrel (PLAVIX) 75 MG tablet Take 75 mg by mouth daily.     . colestipol (COLESTID) 1 g tablet Take 2 g by mouth daily at 12 noon.    . diphenoxylate-atropine (LOMOTIL) 2.5-0.025 MG tablet Take 1 tablet by mouth 3 (three) times daily as needed for diarrhea or loose stools.    . donepezil (ARICEPT) 10 MG tablet Take 1 tablet (10 mg total) by mouth 2 (two) times daily. 180 tablet 3  . furosemide (LASIX) 40 MG tablet Take 40 mg by  mouth at bedtime.    . insulin NPH Human (HUMULIN N,NOVOLIN N) 100 UNIT/ML injection Inject 0.25 mLs (25 Units total) into the skin 2 (two) times daily. (Patient taking differently: Inject 30 Units into the skin 2 (two) times daily. ) 10 mL 11  . insulin regular (NOVOLIN R,HUMULIN R) 100 units/mL injection Inject 0.3 mLs (30 Units total) into the skin 3 (three) times daily with meals. (Patient taking differently: Inject 34 Units into the skin 3 (three) times daily before meals. 34 units subcutaneous 3 times daily with meals with an additional sliding scale of 8 units added with every 20 glucose reading  over 200.) 10 mL 1  . isosorbide mononitrate (IMDUR) 30 MG 24 hr tablet Take 30 mg by mouth daily.     Marland Kitchen lamoTRIgine (LAMICTAL) 100 MG tablet Take 100 mg by mouth 2 (two) times daily.    Marland Kitchen lidocaine (LIDODERM) 5 % Place 1 patch onto the skin daily as needed (pain). Remove & Discard patch within 12 hours or as directed by MD (APPLIED TO PATIENT BACK FOR PAIN)    . losartan (COZAAR) 100 MG tablet Take 100 mg by mouth daily.    . memantine (NAMENDA) 10 MG tablet Take 1 tablet (10 mg total) by mouth 2 (two) times daily. 180 tablet 3  . metoprolol succinate (TOPROL-XL) 100 MG 24 hr tablet Take 100 mg by mouth daily. Take with or immediately following a meal.    . nitroGLYCERIN (NITROSTAT) 0.4 MG SL tablet Place 1 tablet under the tongue every 5 (five) minutes as needed for chest pain.     . Pancrelipase, Lip-Prot-Amyl, (ZENPEP) 25000 units CPEP Take 50,000 Units by mouth 3 (three) times daily with meals.    . pantoprazole (PROTONIX) 40 MG tablet Take 40 mg by mouth at bedtime.     . potassium chloride (K-DUR,KLOR-CON) 10 MEQ tablet Take 10 mEq by mouth 2 (two) times daily.     No current facility-administered medications for this visit.     PAST MEDICAL HISTORY: Past Medical History:  Diagnosis Date  . Acute MI ([place])   . Arthritis   . Back pain   . CAD (coronary artery disease)   . CHF (congestive heart failure) ([place])   . Chronic kidney disease   . Degenerative lumbar disc   . Dementia   . Diabetes mellitus without complication ([place])   . GERD (gastroesophageal reflux disease)   . Hernia of abdominal cavity   . Hyperlipemia   . Hypertension   . Malignant intraductal papillary mucinous tumor of pancreas ([place])   . Memory loss   . Pancreatitis   . Seizures ([place])    staring spells  . Shingles   . Stroke ([place]) 08/2017  . TIA (transient ischemic attack)     PAST SURGICAL HISTORY: Past Surgical History:  Procedure Laterality Date  . CARDIAC CATHETERIZATION    . CORONARY ANGIOPLASTY      . CYSTOSCOPY WITH STENT PLACEMENT Right 05/13/2017   Procedure: CYSTOSCOPY WITH STENT PLACEMENT;  Surgeon: Nickie Retort, MD;  Location: [facility] ORS;  Service: Urology;  Laterality: Right;  . ERCP N/A 09/12/2015   Procedure: ENDOSCOPIC RETROGRADE CHOLANGIOPANCREATOGRAPHY (ERCP);  Surgeon: Hulen Luster, MD;  Location: [facility] ENDOSCOPY;  Service: Gastroenterology;  Laterality: N/A;  . ERCP N/A 01/02/2016   Procedure: ENDOSCOPIC RETROGRADE CHOLANGIOPANCREATOGRAPHY (ERCP);  Surgeon: Hulen Luster, MD;  Location: [facility] ENDOSCOPY;  Service: Gastroenterology;  Laterality: N/A;  . EXTERNAL FIXATION WRIST FRACTURE Left   . left  arm metal plate    . LEFT HEART CATH AND CORONARY ANGIOGRAPHY Left 11/04/2016   Procedure: Left Heart Cath and Coronary Angiography;  Surgeon: Isaias Cowman, MD;  Location: [place] CV LAB;  Service: Cardiovascular;  Laterality: Left;  . Left shoulder surgery    . Stent times 3  2013   Cardiac  . TOTAL ELBOW REPLACEMENT Left   . VASECTOMY    . VENTRICULOPERITONEAL SHUNT      FAMILY HISTORY: Family History  Problem Relation Age of Onset  . Diabetes Mother   . Hypertension Mother   . CAD Mother   . Hyperlipidemia Mother   . Stroke Mother   . ALS Mother   . Alzheimer's disease Father   . Diabetes Father   . Heart Problems Brother     SOCIAL HISTORY:  Social History   Socioeconomic History  . Marital status: Married    Spouse name: Not on file  . Number of children: 2  . Years of education: BS  . Highest education level: Not on file  Occupational History  . Occupation: Disabled  Social Needs  . Financial resource strain: Not on file  . Food insecurity:    Worry: Not on file    Inability: Not on file  . Transportation needs:    Medical: Not on file    Non-medical: Not on file  Tobacco Use  . Smoking status: Former Smoker    Last attempt to quit: 2013    Years since quitting: 6.4  . Smokeless tobacco: Never Used  . Tobacco comment: used to smoke 2PD  for 40 yrs, quit about 4 years ago  Substance and Sexual Activity  . Alcohol use: No    Alcohol/week: 0.0 oz    Comment: occasional  . Drug use: No  . Sexual activity: Not on file  Lifestyle  . Physical activity:    Days per week: Not on file    Minutes per session: Not on file  . Stress: Not on file  Relationships  . Social connections:    Talks on phone: Not on file    Gets together: Not on file    Attends religious service: Not on file    Active member of club or organization: Not on file    Attends meetings of clubs or organizations: Not on file    Relationship status: Not on file  . Intimate partner violence:    Fear of current or ex partner: Not on file    Emotionally abused: Not on file    Physically abused: Not on file    Forced sexual activity: Not on file  Other Topics Concern  . Not on file  Social History Narrative   Lives at home with wife and son. Ambulatory at baseline.   Right-handed   Caffeine: 2 sodas per day     PHYSICAL EXAM   Vitals:   01/30/18 1523  BP: 132/82  Pulse: 64  Weight: 181 lb (82.1 kg)  Height: 5\' 7"  (1.702 m)    Not recorded      Body mass index is 28.35 kg/m.  PHYSICAL EXAMNIATION:  Gen: NAD, conversant, well nourised, obese, well groomed                     Cardiovascular: Regular rate rhythm, no peripheral edema, warm, nontender. Eyes: Conjunctivae clear without exudates or hemorrhage Neck: Supple, no carotid bruits. Pulmonary: Clear to auscultation bilaterally   NEUROLOGICAL EXAM:  MENTAL STATUS: Speech: Endentured,  wet speech, mild slurred, carry on conversation,  Following two-step command Cognition: Oriented to his age  CRANIAL NERVES: CN II: Visual fields are full to confrontation.  Pupils are round equal and briskly reactive to light. CN III, IV, VI: extraocular movement are normal. No ptosis. CN V: Facial sensation is intact to pinprick in all 3 divisions bilaterally. Corneal responses are intact.  CN  VII: Face is symmetric with normal eye closure and smile. CN VIII: Hearing is normal to rubbing fingers CN IX, X: Palate elevates symmetrically. Phonation is normal. CN XI: Head turning and shoulder shrug are intact CN XII: Tongue is midline with normal movements and no atrophy.  MOTOR: Left arm well-healed surgical incision, limited range of motion of left wrist, mild bilateral upper and lower extremity rigidity  REFLEXES: Reflexes are 2+ and symmetric at the biceps, triceps, knees, and ankles. Plantar responses are flexor.  SENSORY: Intact to light touch, pinprick, positional sensation and vibratory sensation are intact in fingers and toes.  COORDINATION: Rapid alternating movements and fine finger movements are intact. There is no dysmetria on finger-to-nose and heel-knee-shin.    GAIT/STANCE: Need to push up getting up from seated position, leaning forward, cautious, unsteady decreased left arm swing,  DIAGNOSTIC DATA (LABS, IMAGING, TESTING) - I reviewed patient records, labs, notes, testing and imaging myself where available.   ASSESSMENT AND PLAN  Maurice Patterson is a 62 y.o. male   Dementia Hypersialorrhea  He would benefit Botox injection to bilateral parotid/submandibular gland  Preauthorization for Botox A100 units, return to clinic injection   Marcial Pacas, M.D. Ph.D.  [facility] [address] [place], [place] [address] Ph: [phone] Fax: [phone]  CC: Referring Provider

## 2018-01-30 NOTE — Telephone Encounter (Signed)
6 weeks botox

## 2018-02-01 ENCOUNTER — Ambulatory Visit: Payer: PPO | Admitting: Physical Therapy

## 2018-02-01 ENCOUNTER — Encounter: Payer: Self-pay | Admitting: Physical Therapy

## 2018-02-01 DIAGNOSIS — R262 Difficulty in walking, not elsewhere classified: Secondary | ICD-10-CM

## 2018-02-01 DIAGNOSIS — M6281 Muscle weakness (generalized): Secondary | ICD-10-CM | POA: Diagnosis not present

## 2018-02-01 DIAGNOSIS — R2681 Unsteadiness on feet: Secondary | ICD-10-CM

## 2018-02-01 DIAGNOSIS — R41841 Cognitive communication deficit: Secondary | ICD-10-CM

## 2018-02-01 NOTE — Therapy (Addendum)
[place] MAIN [facility] SERVICES [address] [place], [place], [address] Phone: [phone]   Fax:  [phone]  Physical Therapy Treatment  Patient Details  Name: Maurice Patterson MRN: [id number] Date of Birth: 1955/12/12 Referring Provider: [name], MD   Encounter Date: 02/01/2018  PT End of Session - 02/01/18 1651    Visit Number  2   Number of Visits  13    Date for PT Re-Evaluation  03/08/18    PT Start Time  0440    PT Stop Time  0520    PT Time Calculation (min)  40 min    Equipment Utilized During Treatment  Gait belt    Activity Tolerance  Patient limited by fatigue;Patient limited by pain    Behavior During Therapy  [facility] for tasks assessed/performed       Past Medical History:  Diagnosis Date  . Acute MI ([place])   . Arthritis   . Back pain   . CAD (coronary artery disease)   . CHF (congestive heart failure) ([place])   . Chronic kidney disease   . Degenerative lumbar disc   . Dementia   . Diabetes mellitus without complication ([place])   . GERD (gastroesophageal reflux disease)   . Hernia of abdominal cavity   . Hyperlipemia   . Hypertension   . Malignant intraductal papillary mucinous tumor of pancreas ([place])   . Memory loss   . Pancreatitis   . Seizures ([place])    staring spells  . Shingles   . Stroke ([place]) 08/2017  . TIA (transient ischemic attack)     Past Surgical History:  Procedure Laterality Date  . CARDIAC CATHETERIZATION    . CORONARY ANGIOPLASTY    . CYSTOSCOPY WITH STENT PLACEMENT Right 05/13/2017   Procedure: CYSTOSCOPY WITH STENT PLACEMENT;  Surgeon: Nickie Retort, MD;  Location: [facility] ORS;  Service: Urology;  Laterality: Right;  . ERCP N/A 09/12/2015   Procedure: ENDOSCOPIC RETROGRADE CHOLANGIOPANCREATOGRAPHY (ERCP);  Surgeon: Hulen Luster, MD;  Location: [facility] ENDOSCOPY;  Service: Gastroenterology;  Laterality: N/A;  . ERCP N/A 01/02/2016   Procedure: ENDOSCOPIC RETROGRADE CHOLANGIOPANCREATOGRAPHY  (ERCP);  Surgeon: Hulen Luster, MD;  Location: [facility] ENDOSCOPY;  Service: Gastroenterology;  Laterality: N/A;  . EXTERNAL FIXATION WRIST FRACTURE Left   . left arm metal plate    . LEFT HEART CATH AND CORONARY ANGIOGRAPHY Left 11/04/2016   Procedure: Left Heart Cath and Coronary Angiography;  Surgeon: Isaias Cowman, MD;  Location: [place] CV LAB;  Service: Cardiovascular;  Laterality: Left;  . Left shoulder surgery    . Stent times 3  2013   Cardiac  . TOTAL ELBOW REPLACEMENT Left   . VASECTOMY    . VENTRICULOPERITONEAL SHUNT      There were no vitals filed for this visit.  Subjective Assessment - 02/01/18 1649    Subjective  Patient says that every time he moves his back hurts a whole lot.     Pertinent History  62 yo Male s/p multiple strokes (most recent in Jan 2019) with impaired memory with early onset alzheimer's dementia. Patient has had PT back in 2017 to work on balance and gait with good results. He reports recently (last few months) he has had trouble getting up and moving around. He denies any recent falls. Family reports that per physcian if he didn't come to PT he would be wheelchair bound within 6 months. Patient alone during history intake which limited our information. He does present  to therapy without AD. He reports catching himself often. He is not driving right now.     Limitations  Walking    How long can you sit comfortably?  NA    How long can you stand comfortably?  10-15 min;     How long can you walk comfortably?  about 400-500 feet with increased left hip/back pain;     Diagnostic tests  MRI has been ordered;     Currently in Pain?  No/denies    Pain Score  5     Pain Location  Back    Pain Orientation  Lower    Pain Descriptors / Indicators  Aching    Pain Type  Chronic pain    Pain Onset  More than a month ago    Pain Frequency  Intermittent    Aggravating Factors   getting up and down    Pain Relieving Factors  sitting    Effect of Pain on Daily  Activities  not able to walk as far       Treatment: Nu-step x 5 mins L 2 spm above 70  Leg press 60 lbs x 10 x 2 with mod assist for cues to push all the way TM x 2 mins x 2 at . 4 miles / hour with poor trunk posture and heavy UE support Step up to stool x 10 with cues to use UE for support  Marching x 10 high steps 3 laps in the parallel bars with UE support  Patient has back pain and significant fatigue and significant cognitive impairment that decreases the amount of exercises done for visit.      Plan - 02/01/18 1749    Clinical Impression Statement Pt presents with unsteadiness on uneven surfaces and fatigues with therapeutic exercises. Patient needs assist with beginning moderate balance activities and needs CGA assist with standing activities. Patient demonstrates difficulty with dynamic standing balance and with narrow  base of support and increased challenges for LE.  Patient tolerated all interventions well this date and skilled PT will continue to improve mobility and strength.    History and Personal Factors relevant to plan of care:  impaired memory due to early onset alzheimers, good caregiver support, 1-2 steps to enter at home without rails, recent falls, older in age    Clinical Presentation  Unstable    Clinical Presentation due to:  recent falls, impaired memory and poor safety awareness contribute to unstable presentation;     Clinical Decision Making  High    Rehab Potential  Fair    Clinical Impairments Affecting Rehab Potential  concerned about progression of early onset alzheimers and how that affects mobility;     PT Frequency  2x / week    PT Duration  6 weeks    PT Treatment/Interventions  ADLs/Self Care Home Management;Cryotherapy;Moist Heat;Therapeutic exercise;Therapeutic activities;Functional mobility training;Stair training;Gait training;DME Instruction;Balance training;Neuromuscular re-education;Patient/family education;Energy conservation     PT Next Visit Plan  address HEP    PT Home Exercise Plan  will address next visit    Consulted and Agree with Plan of Care  Patient;Family member/caregiver    Family Member Consulted  son       Patient will benefit from skilled therapeutic intervention in order to improve the following deficits and impairments:  Abnormal gait, Decreased endurance, Decreased knowledge of precautions, Decreased activity tolerance, Decreased strength, Pain, Difficulty walking, Decreased mobility, Decreased balance, Decreased safety awareness  PT Education - 02/01/18 1650    Education Details  safety with transfers     Person(s) Educated  Patient    Methods  Explanation    Comprehension  Verbalized understanding       PT Short Term Goals - 01/25/18 1756      PT SHORT TERM GOAL #1   Title  Patient will increase 10 meter walk test to >1.42m/s as to improve gait speed for better community ambulation and to reduce fall risk.    Time  4    Period  Weeks    Status  New    Target Date  02/22/18      PT SHORT TERM GOAL #2   Title  Patient will increase BLE gross strength to 4+/5 as to improve functional strength for independent gait, increased standing tolerance and increased ADL ability.    Time  4    Period  Weeks    Status  New    Target Date  02/22/18        PT Long Term Goals - 01/25/18 1758      PT LONG TERM GOAL #1   Title  Patient will increase Berg Balance score by > 6 points to demonstrate decreased fall risk during functional activities.    Time  6    Period  Weeks    Status  New    Target Date  03/08/18      PT LONG TERM GOAL #2   Title  Patient will be adherent to HEP at least 3x a week to improve functional strength and balance for better safety at home.    Time  6    Period  Weeks    Status  New    Target Date  03/08/18      PT LONG TERM GOAL #3   Title  Patient will be accurate with finger to nose pointing x5 reps to improve  fine motor coordination for grasping and using objects in UE when in community such as out to dinner.     Time  6    Period  Weeks    Status  New    Target Date  03/08/18      PT LONG TERM GOAL #4   Title  Patient will be require no assist with ascend/descend 3 steps using Least restrictive assistive device.    Time  6    Period  Weeks    Status  New    Target Date  03/08/18              Patient will benefit from skilled therapeutic intervention in order to improve the following deficits and impairments:     Visit Diagnosis: Muscle weakness (generalized)  Difficulty in walking, not elsewhere classified  Unsteadiness on feet  Cognitive communication deficit     Problem List Patient Active Problem List   Diagnosis Date Noted  . Hypersalivation 01/30/2018  . Drooling 01/30/2018  . Alzheimer's dementia 05/01/2017  . Cognitive decline 10/20/2016  . Chest pain, rule out acute myocardial infarction 04/05/2016  . Confusion 10/18/2015  . Constipation 08/23/2015  . Uncontrolled type 2 diabetes mellitus with hyperglycemia, with long-term current use of insulin ([place]) 08/23/2015  . Essential hypertension 08/23/2015  . Right lower lobe pneumonia ([place]) 08/23/2015  . Abdominal pain 08/21/2015  . Altered mental status 02/19/2015    Alanson Puls, PT DPT 02/01/2018, 4:55 PM  [place] MAIN [facility] SERVICES [place]  [place], [place], [address] Phone: [phone]   Fax:  [phone]  Name: YGNACIO FECTEAU MRN: [id number] Date of Birth: [birth year]

## 2018-02-01 NOTE — Telephone Encounter (Signed)
Called and left message to schedule apt for Botox .  Submitted paper to [employer] [facility] for New start.

## 2018-02-02 DIAGNOSIS — I10 Essential (primary) hypertension: Secondary | ICD-10-CM | POA: Diagnosis not present

## 2018-02-02 DIAGNOSIS — E785 Hyperlipidemia, unspecified: Secondary | ICD-10-CM | POA: Diagnosis not present

## 2018-02-02 DIAGNOSIS — R011 Cardiac murmur, unspecified: Secondary | ICD-10-CM | POA: Diagnosis not present

## 2018-02-02 DIAGNOSIS — Z8673 Personal history of transient ischemic attack (TIA), and cerebral infarction without residual deficits: Secondary | ICD-10-CM | POA: Diagnosis not present

## 2018-02-02 DIAGNOSIS — I214 Non-ST elevation (NSTEMI) myocardial infarction: Secondary | ICD-10-CM | POA: Diagnosis not present

## 2018-02-02 DIAGNOSIS — I2510 Atherosclerotic heart disease of native coronary artery without angina pectoris: Secondary | ICD-10-CM | POA: Diagnosis not present

## 2018-02-02 DIAGNOSIS — E1159 Type 2 diabetes mellitus with other circulatory complications: Secondary | ICD-10-CM | POA: Diagnosis not present

## 2018-02-02 DIAGNOSIS — Z9889 Other specified postprocedural states: Secondary | ICD-10-CM | POA: Diagnosis not present

## 2018-02-02 DIAGNOSIS — E1169 Type 2 diabetes mellitus with other specified complication: Secondary | ICD-10-CM | POA: Diagnosis not present

## 2018-02-02 NOTE — Telephone Encounter (Signed)
Health team [facility] for [phone] & [id number] (exp. 02/13/18 to 05/14/18)

## 2018-02-06 ENCOUNTER — Encounter: Payer: Self-pay | Admitting: Physical Therapy

## 2018-02-06 ENCOUNTER — Ambulatory Visit: Payer: PPO | Admitting: Physical Therapy

## 2018-02-06 DIAGNOSIS — R278 Other lack of coordination: Secondary | ICD-10-CM

## 2018-02-06 DIAGNOSIS — M6281 Muscle weakness (generalized): Secondary | ICD-10-CM | POA: Diagnosis not present

## 2018-02-06 DIAGNOSIS — R471 Dysarthria and anarthria: Secondary | ICD-10-CM

## 2018-02-06 DIAGNOSIS — R4701 Aphasia: Secondary | ICD-10-CM

## 2018-02-06 DIAGNOSIS — R2681 Unsteadiness on feet: Secondary | ICD-10-CM

## 2018-02-06 DIAGNOSIS — R41841 Cognitive communication deficit: Secondary | ICD-10-CM

## 2018-02-06 DIAGNOSIS — R262 Difficulty in walking, not elsewhere classified: Secondary | ICD-10-CM

## 2018-02-06 NOTE — Telephone Encounter (Addendum)
Patients wife called back (listed on DPR) and accepted the apt. Patient has been scheduled. DW

## 2018-02-06 NOTE — Telephone Encounter (Signed)
The patients wife returned my call (listed on DPR) I offered her an apt this Wednesday. She is going to call and confirm with her daughter to be sure and call me back.

## 2018-02-06 NOTE — Therapy (Signed)
[place] MAIN [facility] SERVICES [address] [place], [place], [address] Phone: [phone]   Fax:  [phone]  Physical Therapy Treatment  Patient Details  Name: Maurice Patterson MRN: [id number] Date of Birth: [birth year] Referring Provider: [name], MD   Encounter Date: 02/06/2018  PT End of Session - 02/06/18 1719    Visit Number  3    Number of Visits  13    Date for PT Re-Evaluation  03/08/18    PT Start Time  0500    PT Stop Time  0540    PT Time Calculation (min)  40 min    Equipment Utilized During Treatment  Gait belt    Activity Tolerance  Patient limited by fatigue;Patient limited by pain    Behavior During Therapy  [facility] for tasks assessed/performed       Past Medical History:  Diagnosis Date  . Acute MI ([place])   . Arthritis   . Back pain   . CAD (coronary artery disease)   . CHF (congestive heart failure) ([place])   . Chronic kidney disease   . Degenerative lumbar disc   . Dementia   . Diabetes mellitus without complication ([place])   . GERD (gastroesophageal reflux disease)   . Hernia of abdominal cavity   . Hyperlipemia   . Hypertension   . Malignant intraductal papillary mucinous tumor of pancreas ([place])   . Memory loss   . Pancreatitis   . Seizures ([place])    staring spells  . Shingles   . Stroke ([place]) 08/2017  . TIA (transient ischemic attack)     Past Surgical History:  Procedure Laterality Date  . CARDIAC CATHETERIZATION    . CORONARY ANGIOPLASTY    . CYSTOSCOPY WITH STENT PLACEMENT Right 05/13/2017   Procedure: CYSTOSCOPY WITH STENT PLACEMENT;  Surgeon: Nickie Retort, MD;  Location: [facility] ORS;  Service: Urology;  Laterality: Right;  . ERCP N/A 09/12/2015   Procedure: ENDOSCOPIC RETROGRADE CHOLANGIOPANCREATOGRAPHY (ERCP);  Surgeon: Hulen Luster, MD;  Location: [facility] ENDOSCOPY;  Service: Gastroenterology;  Laterality: N/A;  . ERCP N/A 01/02/2016   Procedure: ENDOSCOPIC RETROGRADE CHOLANGIOPANCREATOGRAPHY  (ERCP);  Surgeon: Hulen Luster, MD;  Location: [facility] ENDOSCOPY;  Service: Gastroenterology;  Laterality: N/A;  . EXTERNAL FIXATION WRIST FRACTURE Left   . left arm metal plate    . LEFT HEART CATH AND CORONARY ANGIOGRAPHY Left 11/04/2016   Procedure: Left Heart Cath and Coronary Angiography;  Surgeon: Isaias Cowman, MD;  Location: [place] CV LAB;  Service: Cardiovascular;  Laterality: Left;  . Left shoulder surgery    . Stent times 3  2013   Cardiac  . TOTAL ELBOW REPLACEMENT Left   . VASECTOMY    . VENTRICULOPERITONEAL SHUNT      There were no vitals filed for this visit.  Subjective Assessment - 02/06/18 1709    Subjective  Patient says that he was having low back pain but now he is fine.     Pertinent History  62 yo Male s/p multiple strokes (most recent in Jan 2019) with impaired memory with early onset alzheimer's dementia. Patient has had PT back in 2017 to work on balance and gait with good results. He reports recently (last few months) he has had trouble getting up and moving around. He denies any recent falls. Family reports that per physcian if he didn't come to PT he would be wheelchair bound within 6 months. Patient alone during history intake which limited our information. He  does present to therapy without AD. He reports catching himself often. He is not driving right now.     Limitations  Walking    How long can you sit comfortably?  NA    How long can you stand comfortably?  10-15 min;     How long can you walk comfortably?  about 400-500 feet with increased left hip/back pain;     Diagnostic tests  MRI has been ordered;     Currently in Pain?  No/denies    Pain Score  0-No pain    Pain Onset  More than a month ago      Treatment:   Ambulation x400 ft without AD with min guard and head turns with poor posture and increased shuffle with the additional task. Slow and unsteady gait, but no LOB. Frequent cues for heel to toe pattern. Pt demonstrated increased  difficulty with heel strike on L LE more than R.  Standing slow marching without UE support 2 x 10;cues for head position  Toe taps on 6 inch stool x 10 bilateral, alternating; cues for posture and foot position  Side stepping with cues to not rotate hips and for correct head position  Stepping  on to 4 inch step  in //bars x15.  Pt required occasional UE assist, and performance improved with each repetition    Stepping one LE over and back x10   with cues for posture and correct technique   Patient has slow movements and decreased coordination and decreased ability to follow instructions and needs increased time to process and repeated instructions.                     PT Education - 02/06/18 1718    Education Details  HEP    Person(s) Educated  Patient    Methods  Explanation;Demonstration;Tactile cues;Verbal cues    Comprehension  Verbalized understanding;Returned demonstration       PT Short Term Goals - 01/25/18 1756      PT SHORT TERM GOAL #1   Title  Patient will increase 10 meter walk test to >1.53m/s as to improve gait speed for better community ambulation and to reduce fall risk.    Time  4    Period  Weeks    Status  New    Target Date  02/22/18      PT SHORT TERM GOAL #2   Title  Patient will increase BLE gross strength to 4+/5 as to improve functional strength for independent gait, increased standing tolerance and increased ADL ability.    Time  4    Period  Weeks    Status  New    Target Date  02/22/18        PT Long Term Goals - 01/25/18 1758      PT LONG TERM GOAL #1   Title  Patient will increase Berg Balance score by > 6 points to demonstrate decreased fall risk during functional activities.    Time  6    Period  Weeks    Status  New    Target Date  03/08/18      PT LONG TERM GOAL #2   Title  Patient will be adherent to HEP at least 3x a week to improve functional strength and balance for better safety at home.    Time  6     Period  Weeks    Status  New    Target Date  03/08/18  PT LONG TERM GOAL #3   Title  Patient will be accurate with finger to nose pointing x5 reps to improve fine motor coordination for grasping and using objects in UE when in community such as out to dinner.     Time  6    Period  Weeks    Status  New    Target Date  03/08/18      PT LONG TERM GOAL #4   Title  Patient will be require no assist with ascend/descend 3 steps using Least restrictive assistive device.    Time  6    Period  Weeks    Status  New    Target Date  03/08/18            Plan - 02/06/18 1719    Clinical Impression Statement  Pt presents with unsteadiness on uneven surfaces and fatigues with therapeutic exercises. Patient needs assist with exercises and needs CGA assist with balance activities. Patient demonstrates difficulty with static standing and with decreased base of support and increased challenges for UE.  Patient tolerated all interventions well this date and will benefit from continued skilled PT interventions to improve strength and balance and decrease risk of falling    Rehab Potential  Fair    Clinical Impairments Affecting Rehab Potential  concerned about progression of early onset alzheimers and how that affects mobility;     PT Frequency  2x / week    PT Duration  6 weeks    PT Treatment/Interventions  ADLs/Self Care Home Management;Cryotherapy;Moist Heat;Therapeutic exercise;Therapeutic activities;Functional mobility training;Stair training;Gait training;DME Instruction;Balance training;Neuromuscular re-education;Patient/family education;Energy conservation    PT Next Visit Plan  address HEP    PT Home Exercise Plan  will address next visit    Consulted and Agree with Plan of Care  Patient;Family member/caregiver    Family Member Consulted  son       Patient will benefit from skilled therapeutic intervention in order to improve the following deficits and impairments:  Abnormal gait,  Decreased endurance, Decreased knowledge of precautions, Decreased activity tolerance, Decreased strength, Pain, Difficulty walking, Decreased mobility, Decreased balance, Decreased safety awareness  Visit Diagnosis: Muscle weakness (generalized)  Difficulty in walking, not elsewhere classified  Unsteadiness on feet  Cognitive communication deficit  Aphasia  Dysarthria and anarthria  Other lack of coordination     Problem List Patient Active Problem List   Diagnosis Date Noted  . Hypersalivation 01/30/2018  . Drooling 01/30/2018  . Alzheimer's dementia 05/01/2017  . Cognitive decline 10/20/2016  . Chest pain, rule out acute myocardial infarction 04/05/2016  . Confusion 10/18/2015  . Constipation 08/23/2015  . Uncontrolled type 2 diabetes mellitus with hyperglycemia, with long-term current use of insulin ([place]) 08/23/2015  . Essential hypertension 08/23/2015  . Right lower lobe pneumonia ([place]) 08/23/2015  . Abdominal pain 08/21/2015  . Altered mental status 02/19/2015    Alanson Puls, PT DPT 02/06/2018, 5:23 PM  [place] MAIN [facility] SERVICES [address] [place], [place], [address] Phone: [phone]   Fax:  [phone]  Name: Maurice Patterson MRN: [id number] Date of Birth: 1955/10/03

## 2018-02-06 NOTE — Telephone Encounter (Signed)
Noted, thank you. I called to schedule patient but he did not answer so I left a VM asking him to call me back.

## 2018-02-08 ENCOUNTER — Ambulatory Visit (INDEPENDENT_AMBULATORY_CARE_PROVIDER_SITE_OTHER): Payer: PPO | Admitting: Neurology

## 2018-02-08 ENCOUNTER — Encounter: Payer: Self-pay | Admitting: Neurology

## 2018-02-08 ENCOUNTER — Telehealth: Payer: Self-pay | Admitting: *Deleted

## 2018-02-08 ENCOUNTER — Ambulatory Visit: Payer: PPO | Admitting: Physical Therapy

## 2018-02-08 ENCOUNTER — Ambulatory Visit: Payer: PPO | Admitting: Neurology

## 2018-02-08 VITALS — BP 158/80 | HR 77 | Ht 67.0 in | Wt 175.5 lb

## 2018-02-08 DIAGNOSIS — K117 Disturbances of salivary secretion: Secondary | ICD-10-CM | POA: Diagnosis not present

## 2018-02-08 MED ORDER — ONABOTULINUMTOXINA 100 UNITS IJ SOLR
100.0000 [IU] | Freq: Once | INTRAMUSCULAR | Status: AC
  Administered 2018-02-08: 100 [IU] via INTRAMUSCULAR

## 2018-02-08 NOTE — Telephone Encounter (Signed)
No showed Botox appointment. 

## 2018-02-08 NOTE — Telephone Encounter (Signed)
Patient arrived at 3:55pm for his 3:30pm appt.  He was stuck in traffic.  Dr. Krista Blue was able to still see him.

## 2018-02-08 NOTE — Progress Notes (Signed)
**  Botox 100 units x 1 vial, NDC 5672-0919-80, Lot [id number], Exp 06/2020, office supply.//mck,rn**

## 2018-02-08 NOTE — Progress Notes (Addendum)
PATIENT: Maurice Patterson DOB: 1955-10-04  Chief Complaint  Patient presents with  . 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 [place] noted to have gradual onset memory loss since 2014, gradually getting worse, recent office visit on Jan 09, 2018 Mini-Mental Status Examination was only 14/30,  He was also noted to have gradual onset gait abnormality, he is still able to dressing, feed himself, but has bowel and bladder accidents sometimes wearing diaper, in the last couple years, he also noticed gradual onset slow worsening excessive drooling, now holding tissues in his hand all the time, drooling on the floor, wet his pillow when woke up  REVIEW OF SYSTEMS: Full 14 system review of systems performed and notable only for memory loss,  ALLERGIES: Allergies  Allergen Reactions  . Hydrocodone Anaphylaxis  . Morphine Other (See Comments)    Loss of memory  . Ambien [Zolpidem] Other (See Comments)    delirium    . Brilinta [Ticagrelor] Other (See Comments)    Stroke   . Flexeril [Cyclobenzaprine] Other (See Comments)    delerium   . Flunitrazepam Other (See Comments)    ROHYPNOL (hallucinations)  . Haldol [Haloperidol Lactate] Other (See Comments)    delerium   . Levetiracetam Diarrhea and Other (See Comments)    Unable to walk  . Lorazepam Hives  . Risperdal [Risperidone] Other (See Comments)    Delirium   . Trazodone Other (See Comments)    Delirium Can take in low doses   . Benadryl [Diphenhydramine Hcl (Sleep)] Rash  . Penicillins Rash    Mouth ulcers Has patient had a PCN reaction causing immediate rash, facial/tongue/throat swelling, SOB or lightheadedness with hypotension:  Yes Has patient had a PCN reaction causing severe rash involving mucus membranes or skin necrosis: No Has patient had a PCN reaction that required hospitalization No Has patient had a PCN reaction occurring within the last 10 years: Yes If all of the above answers are "NO", then may proceed with Cephalosporin use.    HOME MEDICATIONS: Current Outpatient Medications  Medication Sig Dispense Refill  . acetaminophen (TYLENOL) 500 MG tablet Take 500 mg by mouth 3 (three) times daily.    Marland Kitchen aspirin EC 81 MG tablet Take 81 mg by mouth daily.    Marland Kitchen atorvastatin (LIPITOR) 80 MG tablet Take 80 mg by mouth daily.    . baclofen (LIORESAL) 10 MG tablet Take 1 tablet (10 mg total) by mouth at bedtime. 30 each 5  . botulinum toxin Type A (BOTOX) 100 units SOLR injection Inject 100 Units into the muscle every 3 (three) months.    . cholecalciferol (VITAMIN D) 1000 units tablet Take 1,000 Units by mouth daily.    . clopidogrel (PLAVIX) 75 MG tablet Take 75 mg by mouth daily.     . colestipol (COLESTID) 1 g tablet Take 2 g by mouth daily at 12 noon.    . diphenoxylate-atropine (LOMOTIL) 2.5-0.025 MG tablet Take 1 tablet by mouth 3 (three) times daily as needed for diarrhea or loose stools.    . donepezil (ARICEPT) 10 MG tablet Take 1 tablet (10 mg total) by mouth 2 (two) times daily. 180 tablet  3  . furosemide (LASIX) 40 MG tablet Take 40 mg by mouth at bedtime.    . insulin NPH Human (HUMULIN N,NOVOLIN N) 100 UNIT/ML injection Inject 0.25 mLs (25 Units total) into the skin 2 (two) times daily. (Patient taking differently: Inject 30 Units into the skin 2 (two) times daily. ) 10 mL 11  . insulin regular (NOVOLIN R,HUMULIN R) 100 units/mL injection Inject 0.3 mLs (30 Units total) into the skin 3 (three) times daily with meals. (Patient taking differently: Inject 34 Units into the skin 3 (three) times daily before meals. 34 units subcutaneous 3 times daily with meals with an additional sliding scale of 8 units added  with every 20 glucose reading over 200.) 10 mL 1  . isosorbide mononitrate (IMDUR) 30 MG 24 hr tablet Take 30 mg by mouth daily.     Marland Kitchen lamoTRIgine (LAMICTAL) 100 MG tablet Take 100 mg by mouth 2 (two) times daily.    Marland Kitchen lidocaine (LIDODERM) 5 % Place 1 patch onto the skin daily as needed (pain). Remove & Discard patch within 12 hours or as directed by MD (APPLIED TO PATIENT BACK FOR PAIN)    . losartan (COZAAR) 100 MG tablet Take 100 mg by mouth daily.    . memantine (NAMENDA) 10 MG tablet Take 1 tablet (10 mg total) by mouth 2 (two) times daily. 180 tablet 3  . metoprolol succinate (TOPROL-XL) 100 MG 24 hr tablet Take 100 mg by mouth daily. Take with or immediately following a meal.    . nitroGLYCERIN (NITROSTAT) 0.4 MG SL tablet Place 1 tablet under the tongue every 5 (five) minutes as needed for chest pain.     . Pancrelipase, Lip-Prot-Amyl, (ZENPEP) 25000 units CPEP Take 50,000 Units by mouth 3 (three) times daily with meals.    . pantoprazole (PROTONIX) 40 MG tablet Take 40 mg by mouth at bedtime.     . potassium chloride (K-DUR,KLOR-CON) 10 MEQ tablet Take 10 mEq by mouth 2 (two) times daily.     No current facility-administered medications for this visit.     PAST MEDICAL HISTORY: Past Medical History:  Diagnosis Date  . Acute MI ([place])   . Arthritis   . Back pain   . CAD (coronary artery disease)   . CHF (congestive heart failure) ([place])   . Chronic kidney disease   . Degenerative lumbar disc   . Dementia   . Diabetes mellitus without complication ([place])   . GERD (gastroesophageal reflux disease)   . Hernia of abdominal cavity   . Hyperlipemia   . Hypertension   . Malignant intraductal papillary mucinous tumor of pancreas ([place])   . Memory loss   . Pancreatitis   . Seizures ([place])    staring spells  . Shingles   . Stroke ([place]) 08/2017  . TIA (transient ischemic attack)     PAST SURGICAL HISTORY: Past Surgical History:  Procedure Laterality Date  . CARDIAC CATHETERIZATION     . CORONARY ANGIOPLASTY    . CYSTOSCOPY WITH STENT PLACEMENT Right 05/13/2017   Procedure: CYSTOSCOPY WITH STENT PLACEMENT;  Surgeon: Nickie Retort, MD;  Location: [facility] ORS;  Service: Urology;  Laterality: Right;  . ERCP N/A 09/12/2015   Procedure: ENDOSCOPIC RETROGRADE CHOLANGIOPANCREATOGRAPHY (ERCP);  Surgeon: Hulen Luster, MD;  Location: [facility] ENDOSCOPY;  Service: Gastroenterology;  Laterality: N/A;  . ERCP N/A 01/02/2016   Procedure: ENDOSCOPIC RETROGRADE CHOLANGIOPANCREATOGRAPHY (ERCP);  Surgeon: Hulen Luster, MD;  Location: [facility] ENDOSCOPY;  Service: Gastroenterology;  Laterality: N/A;  .  EXTERNAL FIXATION WRIST FRACTURE Left   . left arm metal plate    . LEFT HEART CATH AND CORONARY ANGIOGRAPHY Left 11/04/2016   Procedure: Left Heart Cath and Coronary Angiography;  Surgeon: Isaias Cowman, MD;  Location: [place] CV LAB;  Service: Cardiovascular;  Laterality: Left;  . Left shoulder surgery    . Stent times 3  2013   Cardiac  . TOTAL ELBOW REPLACEMENT Left   . VASECTOMY    . VENTRICULOPERITONEAL SHUNT      FAMILY HISTORY: Family History  Problem Relation Age of Onset  . Diabetes Mother   . Hypertension Mother   . CAD Mother   . Hyperlipidemia Mother   . Stroke Mother   . ALS Mother   . Alzheimer's disease Father   . Diabetes Father   . Heart Problems Brother     SOCIAL HISTORY:  Social History   Socioeconomic History  . Marital status: Married    Spouse name: Not on file  . Number of children: 2  . Years of education: BS  . Highest education level: Not on file  Occupational History  . Occupation: Disabled  Social Needs  . Financial resource strain: Not on file  . Food insecurity:    Worry: Not on file    Inability: Not on file  . Transportation needs:    Medical: Not on file    Non-medical: Not on file  Tobacco Use  . Smoking status: Former Smoker    Last attempt to quit: 2013    Years since quitting: 6.4  . Smokeless tobacco: Never Used  .  Tobacco comment: used to smoke 2PD for 40 yrs, quit about 4 years ago  Substance and Sexual Activity  . Alcohol use: No    Alcohol/week: 0.0 oz    Comment: occasional  . Drug use: No  . Sexual activity: Not on file  Lifestyle  . Physical activity:    Days per week: Not on file    Minutes per session: Not on file  . Stress: Not on file  Relationships  . Social connections:    Talks on phone: Not on file    Gets together: Not on file    Attends religious service: Not on file    Active member of club or organization: Not on file    Attends meetings of clubs or organizations: Not on file    Relationship status: Not on file  . Intimate partner violence:    Fear of current or ex partner: Not on file    Emotionally abused: Not on file    Physically abused: Not on file    Forced sexual activity: Not on file  Other Topics Concern  . Not on file  Social History Narrative   Lives at home with wife and son. Ambulatory at baseline.   Right-handed   Caffeine: 2 sodas per day     PHYSICAL EXAM   Vitals:   02/08/18 1555  BP: (!) 158/80  Pulse: 77  Weight: 175 lb 8 oz (79.6 kg)  Height: 5\' 7"  (1.702 m)    Not recorded      Body mass index is 27.49 kg/m.  PHYSICAL EXAMNIATION:  Gen: NAD, conversant, well nourised, obese, well groomed                     Cardiovascular: Regular rate rhythm, no peripheral edema, warm, nontender. Eyes: Conjunctivae clear without exudates or hemorrhage Neck: Supple, no carotid bruits. Pulmonary: Clear  to auscultation bilaterally   NEUROLOGICAL EXAM:  MENTAL STATUS: Speech: Endentured, wet speech, mild slurred, carry on conversation,  Following two-step command Cognition: Oriented to his age  CRANIAL NERVES: CN II: Visual fields are full to confrontation.  Pupils are round equal and briskly reactive to light. CN III, IV, VI: extraocular movement are normal. No ptosis. CN V: Facial sensation is intact to pinprick in all 3 divisions  bilaterally. Corneal responses are intact.  CN VII: Face is symmetric with normal eye closure and smile. CN VIII: Hearing is normal to rubbing fingers CN IX, X: Palate elevates symmetrically. Phonation is normal. CN XI: Head turning and shoulder shrug are intact CN XII: Tongue is midline with normal movements and no atrophy.  MOTOR: Left arm well-healed surgical incision, limited range of motion of left wrist, mild bilateral upper and lower extremity rigidity  REFLEXES: Reflexes are 2+ and symmetric at the biceps, triceps, knees, and ankles. Plantar responses are flexor.  SENSORY: Intact to light touch, pinprick, positional sensation and vibratory sensation are intact in fingers and toes.  COORDINATION: Rapid alternating movements and fine finger movements are intact. There is no dysmetria on finger-to-nose and heel-knee-shin.    GAIT/STANCE: Need to push up getting up from seated position, leaning forward, cautious, unsteady decreased left arm swing,  DIAGNOSTIC DATA (LABS, IMAGING, TESTING) - I reviewed patient records, labs, notes, testing and imaging myself where available.   ASSESSMENT AND PLAN  HOLLEY WIRT is a 62 y.o. male   Dementia Hypersialorrhea  He returned for BOTOX injection for hyper-sialorrhea, this is his first injection  We used the Botox a 100 units, 50 units for each parotid gland at multiple injection site,  He tolerated injection well   Marcial Pacas, M.D. Ph.D.  [facility] [address] [place], [place] [address] Ph: [phone] Fax: [phone]  CC: Referring Provider

## 2018-02-13 ENCOUNTER — Ambulatory Visit: Payer: PPO | Admitting: Physical Therapy

## 2018-02-13 DIAGNOSIS — M6281 Muscle weakness (generalized): Secondary | ICD-10-CM | POA: Diagnosis not present

## 2018-02-13 DIAGNOSIS — R2681 Unsteadiness on feet: Secondary | ICD-10-CM

## 2018-02-13 DIAGNOSIS — R262 Difficulty in walking, not elsewhere classified: Secondary | ICD-10-CM

## 2018-02-13 NOTE — Therapy (Signed)
[place] MAIN [facility] SERVICES [address] [place], [place], [address] Phone: [phone]   Fax:  [phone]  Physical Therapy Treatment  Patient Details  Name: RUSELL MENEELY MRN: [id number] Date of Birth: 1955/12/19 Referring Provider: [name], MD   Encounter Date: 02/13/2018  PT End of Session - 02/13/18 1704    Visit Number  4    Number of Visits  13    Date for PT Re-Evaluation  03/08/18    PT Start Time  0762    PT Stop Time  1720    PT Time Calculation (min)  26 min    Activity Tolerance  Patient limited by fatigue;Patient limited by pain    Behavior During Therapy  [facility] for tasks assessed/performed       Past Medical History:  Diagnosis Date  . Acute MI ([place])   . Arthritis   . Back pain   . CAD (coronary artery disease)   . CHF (congestive heart failure) ([place])   . Chronic kidney disease   . Degenerative lumbar disc   . Dementia   . Diabetes mellitus without complication ([place])   . GERD (gastroesophageal reflux disease)   . Hernia of abdominal cavity   . Hyperlipemia   . Hypertension   . Malignant intraductal papillary mucinous tumor of pancreas ([place])   . Memory loss   . Pancreatitis   . Seizures ([place])    staring spells  . Shingles   . Stroke ([place]) 08/2017  . TIA (transient ischemic attack)     Past Surgical History:  Procedure Laterality Date  . CARDIAC CATHETERIZATION    . CORONARY ANGIOPLASTY    . CYSTOSCOPY WITH STENT PLACEMENT Right 05/13/2017   Procedure: CYSTOSCOPY WITH STENT PLACEMENT;  Surgeon: Nickie Retort, MD;  Location: [facility] ORS;  Service: Urology;  Laterality: Right;  . ERCP N/A 09/12/2015   Procedure: ENDOSCOPIC RETROGRADE CHOLANGIOPANCREATOGRAPHY (ERCP);  Surgeon: Hulen Luster, MD;  Location: [facility] ENDOSCOPY;  Service: Gastroenterology;  Laterality: N/A;  . ERCP N/A 01/02/2016   Procedure: ENDOSCOPIC RETROGRADE CHOLANGIOPANCREATOGRAPHY (ERCP);  Surgeon: Hulen Luster, MD;  Location: [facility]  ENDOSCOPY;  Service: Gastroenterology;  Laterality: N/A;  . EXTERNAL FIXATION WRIST FRACTURE Left   . left arm metal plate    . LEFT HEART CATH AND CORONARY ANGIOGRAPHY Left 11/04/2016   Procedure: Left Heart Cath and Coronary Angiography;  Surgeon: Isaias Cowman, MD;  Location: [place] CV LAB;  Service: Cardiovascular;  Laterality: Left;  . Left shoulder surgery    . Stent times 3  2013   Cardiac  . TOTAL ELBOW REPLACEMENT Left   . VASECTOMY    . VENTRICULOPERITONEAL SHUNT      There were no vitals filed for this visit.  Subjective Assessment - 02/13/18 1702    Subjective  Pt says no major updates since last session. He reports HEP is going fine. He says back continues to hurt most with transition coming to standing after prolonged sitting.     Pertinent History  62 yo Male s/p multiple strokes (most recent in Jan 2019) with impaired memory with early onset alzheimer's dementia. Patient has had PT back in 2017 to work on balance and gait with good results. He reports recently (last few months) he has had trouble getting up and moving around. He denies any recent falls. Family reports that per physcian if he didn't come to PT he would be wheelchair bound within 6 months. Patient alone during history intake  which limited our information. He does present to therapy without AD. He reports catching himself often. He is not driving right now.     Currently in Pain?  Yes    Pain Score  5     Pain Location  Back          Therapeutic Interventions This Session:  -Ambulation x400 ft without AD at supervision level; gradual progression of forward flexed posture with eventual onset of central low back pain. Heel strike remains patent, however Trendelenburg gait begins to develop.  **0.11m/s s AD  -STS transfers from chair: 1x10  -Standing slow marching without UE support 1x10;cues for head position (DC after poor ability to perform d/t pain) -Supine SKTC stretching Pt moves to left  side-lying, halfway rolled to hook-lying, then freezes and reports he is not able to lie on his back d/t post surgical precautions which is unable to explain in detail.      PT Short Term Goals - 01/25/18 1756      PT SHORT TERM GOAL #1   Title  Patient will increase 10 meter walk test to >1.52m/s as to improve gait speed for better community ambulation and to reduce fall risk.    Time  4    Period  Weeks    Status  New    Target Date  02/22/18      PT SHORT TERM GOAL #2   Title  Patient will increase BLE gross strength to 4+/5 as to improve functional strength for independent gait, increased standing tolerance and increased ADL ability.    Time  4    Period  Weeks    Status  New    Target Date  02/22/18        PT Long Term Goals - 01/25/18 1758      PT LONG TERM GOAL #1   Title  Patient will increase Berg Balance score by > 6 points to demonstrate decreased fall risk during functional activities.    Time  6    Period  Weeks    Status  New    Target Date  03/08/18      PT LONG TERM GOAL #2   Title  Patient will be adherent to HEP at least 3x a week to improve functional strength and balance for better safety at home.    Time  6    Period  Weeks    Status  New    Target Date  03/08/18      PT LONG TERM GOAL #3   Title  Patient will be accurate with finger to nose pointing x5 reps to improve fine motor coordination for grasping and using objects in UE when in community such as out to dinner.     Time  6    Period  Weeks    Status  New    Target Date  03/08/18      PT LONG TERM GOAL #4   Title  Patient will be require no assist with ascend/descend 3 steps using Least restrictive assistive device.    Time  6    Period  Weeks    Status  New    Target Date  03/08/18            Plan - 02/13/18 1713    Clinical Impression Statement  Session starts out with AMB this date, with onset of central low back pain after 152ft, which limits patients ability to mobilize  without arresting pain. AMB  is limitd to 437ft. Pt cont to have a forward flexed posture and worsening back pain throughout session, which limits all activity due to significant antalgic movement strategies, all of which eventually worsen pain. Session is transisionted to supine program with intent to more gently address acute pain aggravation, and patient is agreeable to move to hooklying, but halfway there freezes adn explains that he is unable to lie in supine,and never does so at home. Session is ended early as pain continues to worse without any clear aleviating factors. Pt is not communicating well his post surgical limitations and family is not available for clarification. Pt is agreebale to end session.     Rehab Potential  Fair    Clinical Impairments Affecting Rehab Potential  concerned about progression of early onset alzheimers and how that affects mobility;     PT Frequency  2x / week    PT Duration  6 weeks    PT Treatment/Interventions  ADLs/Self Care Home Management;Cryotherapy;Moist Heat;Therapeutic exercise;Therapeutic activities;Functional mobility training;Stair training;Gait training;DME Instruction;Balance training;Neuromuscular re-education;Patient/family education;Energy conservation    PT Next Visit Plan  Continue with current program FUw family as needed regarding surgical history.     PT Home Exercise Plan  no updates this date.     Consulted and Agree with Plan of Care  Patient       Patient will benefit from skilled therapeutic intervention in order to improve the following deficits and impairments:  Abnormal gait, Decreased endurance, Decreased knowledge of precautions, Decreased activity tolerance, Decreased strength, Pain, Difficulty walking, Decreased mobility, Decreased balance, Decreased safety awareness  Visit Diagnosis: Muscle weakness (generalized)  Difficulty in walking, not elsewhere classified  Unsteadiness on feet     Problem List Patient Active  Problem List   Diagnosis Date Noted  . Hypersalivation 01/30/2018  . Drooling 01/30/2018  . Alzheimer's dementia 05/01/2017  . Cognitive decline 10/20/2016  . Chest pain, rule out acute myocardial infarction 04/05/2016  . Confusion 10/18/2015  . Constipation 08/23/2015  . Uncontrolled type 2 diabetes mellitus with hyperglycemia, with long-term current use of insulin ([place]) 08/23/2015  . Essential hypertension 08/23/2015  . Right lower lobe pneumonia ([place]) 08/23/2015  . Abdominal pain 08/21/2015  . Altered mental status 02/19/2015   5:29 PM, 02/13/18 Etta Grandchild, PT, DPT Physical Therapist - [place] [phone]    Etta Grandchild 02/13/2018, 5:28 PM  [place] MAIN [facility] SERVICES [address] [place], [place], [address] Phone: [phone]   Fax:  [phone]  Name: SMITTY ACKERLEY MRN: [id number] Date of Birth: January 25, 1956

## 2018-02-15 ENCOUNTER — Ambulatory Visit: Payer: PPO | Admitting: Physical Therapy

## 2018-02-15 ENCOUNTER — Encounter: Payer: Self-pay | Admitting: Physical Therapy

## 2018-02-15 DIAGNOSIS — R41841 Cognitive communication deficit: Secondary | ICD-10-CM

## 2018-02-15 DIAGNOSIS — R262 Difficulty in walking, not elsewhere classified: Secondary | ICD-10-CM

## 2018-02-15 DIAGNOSIS — M6281 Muscle weakness (generalized): Secondary | ICD-10-CM

## 2018-02-15 DIAGNOSIS — R278 Other lack of coordination: Secondary | ICD-10-CM

## 2018-02-15 DIAGNOSIS — R2681 Unsteadiness on feet: Secondary | ICD-10-CM

## 2018-02-15 DIAGNOSIS — R471 Dysarthria and anarthria: Secondary | ICD-10-CM

## 2018-02-15 DIAGNOSIS — R4701 Aphasia: Secondary | ICD-10-CM

## 2018-02-15 NOTE — Therapy (Signed)
[place] MAIN [facility] SERVICES [address] [place], [place], [address] Phone: [phone]   Fax:  [phone]  Physical Therapy Treatment  Patient Details  Name: Maurice Patterson MRN: [id number] Date of Birth: 1955-11-21 Referring Provider: [name], MD   Encounter Date: 02/15/2018  PT End of Session - 02/15/18 1741    Visit Number  5    Number of Visits  13    Date for PT Re-Evaluation  03/08/18    PT Start Time  0525    PT Stop Time  0610    PT Time Calculation (min)  45 min    Activity Tolerance  Patient limited by fatigue;Patient limited by pain    Behavior During Therapy  [facility] for tasks assessed/performed       Past Medical History:  Diagnosis Date  . Acute MI ([place])   . Arthritis   . Back pain   . CAD (coronary artery disease)   . CHF (congestive heart failure) ([place])   . Chronic kidney disease   . Degenerative lumbar disc   . Dementia   . Diabetes mellitus without complication ([place])   . GERD (gastroesophageal reflux disease)   . Hernia of abdominal cavity   . Hyperlipemia   . Hypertension   . Malignant intraductal papillary mucinous tumor of pancreas ([place])   . Memory loss   . Pancreatitis   . Seizures ([place])    staring spells  . Shingles   . Stroke ([place]) 08/2017  . TIA (transient ischemic attack)     Past Surgical History:  Procedure Laterality Date  . CARDIAC CATHETERIZATION    . CORONARY ANGIOPLASTY    . CYSTOSCOPY WITH STENT PLACEMENT Right 05/13/2017   Procedure: CYSTOSCOPY WITH STENT PLACEMENT;  Surgeon: Nickie Retort, MD;  Location: [facility] ORS;  Service: Urology;  Laterality: Right;  . ERCP N/A 09/12/2015   Procedure: ENDOSCOPIC RETROGRADE CHOLANGIOPANCREATOGRAPHY (ERCP);  Surgeon: Hulen Luster, MD;  Location: [facility] ENDOSCOPY;  Service: Gastroenterology;  Laterality: N/A;  . ERCP N/A 01/02/2016   Procedure: ENDOSCOPIC RETROGRADE CHOLANGIOPANCREATOGRAPHY (ERCP);  Surgeon: Hulen Luster, MD;  Location: [facility]  ENDOSCOPY;  Service: Gastroenterology;  Laterality: N/A;  . EXTERNAL FIXATION WRIST FRACTURE Left   . left arm metal plate    . LEFT HEART CATH AND CORONARY ANGIOGRAPHY Left 11/04/2016   Procedure: Left Heart Cath and Coronary Angiography;  Surgeon: Isaias Cowman, MD;  Location: [place] CV LAB;  Service: Cardiovascular;  Laterality: Left;  . Left shoulder surgery    . Stent times 3  2013   Cardiac  . TOTAL ELBOW REPLACEMENT Left   . VASECTOMY    . VENTRICULOPERITONEAL SHUNT      There were no vitals filed for this visit.  Subjective Assessment - 02/15/18 1739    Subjective  Patient reports that his back hurts but is not able to rate his pain. He says that he is weak and he can't do these exercises at home.     Pertinent History  62 yo Male s/p multiple strokes (most recent in Jan 2019) with impaired memory with early onset alzheimer's dementia. Patient has had PT back in 2017 to work on balance and gait with good results. He reports recently (last few months) he has had trouble getting up and moving around. He denies any recent falls. Family reports that per physcian if he didn't come to PT he would be wheelchair bound within 6 months. Patient alone during history intake which  limited our information. He does present to therapy without AD. He reports catching himself often. He is not driving right now.     Limitations  Walking    How long can you sit comfortably?  NA    How long can you stand comfortably?  10-15 min;     How long can you walk comfortably?  about 400-500 feet with increased left hip/back pain;     Diagnostic tests  MRI has been ordered;     Currently in Pain?  -- unable to rate his pan    Pain Onset  More than a month ago        Treatment: Therapeutic exercise; Sitting nu-step x 5 mins  Sitting LAQ x 20 x 2 BLE Sitting hip flex x 20 x 2 BLE sidelying with hip abd x 10 x 2 BLE  Therapeutic activities: TM walking x .3 miles/ hour x  3 mins Sit to stand x  5 with cues to lean fwd and push from the mat Sit to sidelying , sidelying to sit with min assist and max vC for getting his legs up on the mat table Sitting with OTB and hip abd/ER x 20  Knee flex with OTB x 20 BLE Leg press 90 lbs x 20 x 2   Patient has slow motor planning and decreased cognitive awarness and needs increased time to process                       PT Education - 02/15/18 1741    Education Details  HEP    Person(s) Educated  Patient    Methods  Explanation;Demonstration;Tactile cues;Verbal cues    Comprehension  Verbalized understanding;Need further instruction       PT Short Term Goals - 01/25/18 1756      PT SHORT TERM GOAL #1   Title  Patient will increase 10 meter walk test to >1.66m/s as to improve gait speed for better community ambulation and to reduce fall risk.    Time  4    Period  Weeks    Status  New    Target Date  02/22/18      PT SHORT TERM GOAL #2   Title  Patient will increase BLE gross strength to 4+/5 as to improve functional strength for independent gait, increased standing tolerance and increased ADL ability.    Time  4    Period  Weeks    Status  New    Target Date  02/22/18        PT Long Term Goals - 01/25/18 1758      PT LONG TERM GOAL #1   Title  Patient will increase Berg Balance score by > 6 points to demonstrate decreased fall risk during functional activities.    Time  6    Period  Weeks    Status  New    Target Date  03/08/18      PT LONG TERM GOAL #2   Title  Patient will be adherent to HEP at least 3x a week to improve functional strength and balance for better safety at home.    Time  6    Period  Weeks    Status  New    Target Date  03/08/18      PT LONG TERM GOAL #3   Title  Patient will be accurate with finger to nose pointing x5 reps to improve fine motor coordination for grasping and using objects  in UE when in community such as out to dinner.     Time  6    Period  Weeks    Status  New     Target Date  03/08/18      PT LONG TERM GOAL #4   Title  Patient will be require no assist with ascend/descend 3 steps using Least restrictive assistive device.    Time  6    Period  Weeks    Status  New    Target Date  03/08/18            Plan - 02/15/18 1743    Clinical Impression Statement  Patient performs beginning level exercises in sidelying and sitting. Patient has freezing episodes and reports pain with standing activities. TM walking at . 3 m/ hour and BUE support. Patient will continue to benefit from skiiled PT to improve mobiltiy .     Rehab Potential  Fair    Clinical Impairments Affecting Rehab Potential  concerned about progression of early onset alzheimers and how that affects mobility;     PT Frequency  2x / week    PT Duration  6 weeks    PT Treatment/Interventions  ADLs/Self Care Home Management;Cryotherapy;Moist Heat;Therapeutic exercise;Therapeutic activities;Functional mobility training;Stair training;Gait training;DME Instruction;Balance training;Neuromuscular re-education;Patient/family education;Energy conservation    PT Next Visit Plan  Continue with current program FUw family as needed regarding surgical history.     PT Home Exercise Plan  no updates this date.     Consulted and Agree with Plan of Care  Patient       Patient will benefit from skilled therapeutic intervention in order to improve the following deficits and impairments:  Abnormal gait, Decreased endurance, Decreased knowledge of precautions, Decreased activity tolerance, Decreased strength, Pain, Difficulty walking, Decreased mobility, Decreased balance, Decreased safety awareness  Visit Diagnosis: Difficulty in walking, not elsewhere classified  Unsteadiness on feet  Cognitive communication deficit  Aphasia  Dysarthria and anarthria  Other lack of coordination  Muscle weakness (generalized)     Problem List Patient Active Problem List   Diagnosis Date Noted  .  Hypersalivation 01/30/2018  . Drooling 01/30/2018  . Alzheimer's dementia 05/01/2017  . Cognitive decline 10/20/2016  . Chest pain, rule out acute myocardial infarction 04/05/2016  . Confusion 10/18/2015  . Constipation 08/23/2015  . Uncontrolled type 2 diabetes mellitus with hyperglycemia, with long-term current use of insulin ([place]) 08/23/2015  . Essential hypertension 08/23/2015  . Right lower lobe pneumonia ([place]) 08/23/2015  . Abdominal pain 08/21/2015  . Altered mental status 02/19/2015    Alanson Puls, PT DPT 02/15/2018, 5:46 PM  [place] MAIN [facility] SERVICES [address] [place], [place], [address] Phone: [phone]   Fax:  [phone]  Name: Maurice Patterson MRN: [id number] Date of Birth: 1956/03/29

## 2018-02-20 ENCOUNTER — Ambulatory Visit: Payer: PPO | Attending: Family Medicine | Admitting: Physical Therapy

## 2018-02-20 DIAGNOSIS — R4701 Aphasia: Secondary | ICD-10-CM | POA: Insufficient documentation

## 2018-02-20 DIAGNOSIS — R41841 Cognitive communication deficit: Secondary | ICD-10-CM | POA: Insufficient documentation

## 2018-02-20 DIAGNOSIS — M6281 Muscle weakness (generalized): Secondary | ICD-10-CM | POA: Insufficient documentation

## 2018-02-20 DIAGNOSIS — R278 Other lack of coordination: Secondary | ICD-10-CM | POA: Insufficient documentation

## 2018-02-20 DIAGNOSIS — R2681 Unsteadiness on feet: Secondary | ICD-10-CM | POA: Insufficient documentation

## 2018-02-20 DIAGNOSIS — R262 Difficulty in walking, not elsewhere classified: Secondary | ICD-10-CM | POA: Insufficient documentation

## 2018-02-27 ENCOUNTER — Ambulatory Visit: Payer: PPO

## 2018-02-27 VITALS — BP 165/82 | HR 73

## 2018-02-27 DIAGNOSIS — R262 Difficulty in walking, not elsewhere classified: Secondary | ICD-10-CM | POA: Diagnosis not present

## 2018-02-27 DIAGNOSIS — R4701 Aphasia: Secondary | ICD-10-CM | POA: Diagnosis not present

## 2018-02-27 DIAGNOSIS — R41841 Cognitive communication deficit: Secondary | ICD-10-CM | POA: Diagnosis not present

## 2018-02-27 DIAGNOSIS — R2681 Unsteadiness on feet: Secondary | ICD-10-CM | POA: Diagnosis not present

## 2018-02-27 DIAGNOSIS — R278 Other lack of coordination: Secondary | ICD-10-CM | POA: Diagnosis not present

## 2018-02-27 DIAGNOSIS — M6281 Muscle weakness (generalized): Secondary | ICD-10-CM | POA: Diagnosis not present

## 2018-02-27 NOTE — Therapy (Signed)
[place] MAIN [facility] SERVICES [address] [place], [place], [address] Phone: [phone]   Fax:  [phone]  Physical Therapy Treatment/Progress Note  Dates of reporting period  01/25/18   to   02/27/18  Patient Details  Name: Maurice Patterson MRN: [id number] Date of Birth: August 25, 1955 Referring Provider: [name], MD   Encounter Date: 02/27/2018  PT End of Session - 02/28/18 1650    Visit Number  6    Number of Visits  13    Date for PT Re-Evaluation  03/08/18    PT Start Time  4259    PT Stop Time  1725    PT Time Calculation (min)  40 min    Activity Tolerance  Patient limited by fatigue    Behavior During Therapy  [facility] for tasks assessed/performed       Past Medical History:  Diagnosis Date  . Acute MI ([place])   . Arthritis   . Back pain   . CAD (coronary artery disease)   . CHF (congestive heart failure) ([place])   . Chronic kidney disease   . Degenerative lumbar disc   . Dementia   . Diabetes mellitus without complication ([place])   . GERD (gastroesophageal reflux disease)   . Hernia of abdominal cavity   . Hyperlipemia   . Hypertension   . Malignant intraductal papillary mucinous tumor of pancreas ([place])   . Memory loss   . Pancreatitis   . Seizures ([place])    staring spells  . Shingles   . Stroke ([place]) 08/2017  . TIA (transient ischemic attack)     Past Surgical History:  Procedure Laterality Date  . CARDIAC CATHETERIZATION    . CORONARY ANGIOPLASTY    . CYSTOSCOPY WITH STENT PLACEMENT Right 05/13/2017   Procedure: CYSTOSCOPY WITH STENT PLACEMENT;  Surgeon: Nickie Retort, MD;  Location: [facility] ORS;  Service: Urology;  Laterality: Right;  . ERCP N/A 09/12/2015   Procedure: ENDOSCOPIC RETROGRADE CHOLANGIOPANCREATOGRAPHY (ERCP);  Surgeon: Hulen Luster, MD;  Location: [facility] ENDOSCOPY;  Service: Gastroenterology;  Laterality: N/A;  . ERCP N/A 01/02/2016   Procedure: ENDOSCOPIC RETROGRADE CHOLANGIOPANCREATOGRAPHY (ERCP);   Surgeon: Hulen Luster, MD;  Location: [facility] ENDOSCOPY;  Service: Gastroenterology;  Laterality: N/A;  . EXTERNAL FIXATION WRIST FRACTURE Left   . left arm metal plate    . LEFT HEART CATH AND CORONARY ANGIOGRAPHY Left 11/04/2016   Procedure: Left Heart Cath and Coronary Angiography;  Surgeon: Isaias Cowman, MD;  Location: [place] CV LAB;  Service: Cardiovascular;  Laterality: Left;  . Left shoulder surgery    . Stent times 3  2013   Cardiac  . TOTAL ELBOW REPLACEMENT Left   . VASECTOMY    . VENTRICULOPERITONEAL SHUNT      Vitals:   02/27/18 1649  BP: (!) 165/82  Pulse: 73  SpO2: 100%    Subjective Assessment - 02/27/18 1648    Subjective  Pt states that he has a "little back pain" at the moment. He rates his pain as a 5/10 upon arrival. No specific questions or concerns currently    Pertinent History  62 yo Male s/p multiple strokes (most recent in Jan 2019) with impaired memory with early onset alzheimer's dementia. Patient has had PT back in 2017 to work on balance and gait with good results. He reports recently (last few months) he has had trouble getting up and moving around. He denies any recent falls. Family reports that per physcian if he  didn't come to PT he would be wheelchair bound within 6 months. Patient alone during history intake which limited our information. He does present to therapy without AD. He reports catching himself often. He is not driving right now.     Limitations  Walking    How long can you sit comfortably?  NA    How long can you stand comfortably?  10-15 min;     How long can you walk comfortably?  about 400-500 feet with increased left hip/back pain;     Diagnostic tests  MRI has been ordered;     Currently in Pain?  -- "A little" but unable to rate    Pain Onset  --         [facility] PT Assessment - 02/27/18 1651      Standardized Balance Assessment   Five times sit to stand comments   11.5 sec without HHA (<15 sec indicates low fall risk)     10 Meter Walk  12.9s = 0.78 m/s self selected without assistive device and 11.4s=0.88 m/s fastest speed without assistive device      Berg Balance Test   Sit to Stand  Able to stand without using hands and stabilize independently    Standing Unsupported  Able to stand safely 2 minutes    Sitting with Back Unsupported but Feet Supported on Floor or Stool  Able to sit safely and securely 2 minutes    Stand to Sit  Sits safely with minimal use of hands    Transfers  Able to transfer safely, minor use of hands    Standing Unsupported with Eyes Closed  Able to stand 10 seconds safely    Standing Ubsupported with Feet Together  Able to place feet together independently and stand 1 minute safely    From Standing, Reach Forward with Outstretched Arm  Reaches forward but needs supervision    From Standing Position, Pick up Object from [place] to pick up shoe safely and easily    From Standing Position, Turn to Look Behind Over each Shoulder  Looks behind from both sides and weight shifts well    Turn 360 Degrees  Able to turn 360 degrees safely but slowly    Standing Unsupported, Alternately Place Feet on Step/Stool  Able to complete >2 steps/needs minimal assist    Standing Unsupported, One Foot in Front  Able to plae foot ahead of the other independently and hold 30 seconds    Standing on One Leg  Tries to lift leg/unable to hold 3 seconds but remains standing independently    Total Score  44    Berg comment:  80% risk for falls but improved from 37/56 at initial evaluation         TREATMENT  Ther-ex  Performed outcome measures including 74m gait speed, BERG, 5TSTS, LE strength testing, steps, and fine motor coordination testing (see above and below for results); LE strength testing (bilateral unless otherwise noted): hip flexion: 5/5, hip IR: 4+/5, hip ER: 5/5, hip abduction/aduction 4-/5, pt unable to lay prone to test hip extension, knee extension: 5/5, knee flexion: 4+/5, ankle  dorsiflexion: 4+/5;  Updated goals with patient; Seated marching x 15 bilateral; Sit to stand without UE support with 2kg med ball 2 x 10;  Patient has slow motor planning and decreased cognitive awarness and needs increased time to process                 PT Education - 02/28/18  1650    Education Details  exercise form/technique, outcome measures, goals    Person(s) Educated  Patient    Methods  Explanation    Comprehension  Verbalized understanding       PT Short Term Goals - 02/27/18 1704      PT SHORT TERM GOAL #1   Title  Patient will increase 10 meter walk test to >1.73m/s as to improve gait speed for better community ambulation and to reduce fall risk.    Baseline  02/27/18: 12.9s = 0.78 m/s self selected without assistive device and 11.4s=0.88 m/s fastest speed without assistive device    Time  4    Period  Weeks    Status  On-going    Target Date  02/22/18      PT SHORT TERM GOAL #2   Title  Patient will increase BLE gross strength to 4+/5 as to improve functional strength for independent gait, increased standing tolerance and increased ADL ability.    Baseline  Bilateral hip flexion: 5/5, hip IR: 4+/5, hip ER: 5/5, hip abduction/aduction 4-/5, pt unable to lay prone to test hip extension, knee extension: 5/5, knee flexion: 4+/5, ankle dorsiflexion: 4+/5;     Time  4    Period  Weeks    Status  On-going    Target Date  02/22/18        PT Long Term Goals - 02/27/18 1717      PT LONG TERM GOAL #1   Title  Patient will increase Berg Balance score by > 6 points to demonstrate decreased fall risk during functional activities.    Baseline  02/27/18: 44/56    Time  6    Period  Weeks    Status  Achieved      PT LONG TERM GOAL #2   Title  Patient will be adherent to HEP at least 3x a week to improve functional strength and balance for better safety at home.    Baseline  02/27/18: "every other day"    Time  6    Period  Weeks    Status  Achieved      PT LONG  TERM GOAL #3   Title  Patient will be accurate with finger to nose pointing x5 reps to improve fine motor coordination for grasping and using objects in UE when in community such as out to dinner.     Baseline  02/27/18: BUE coordination remains dysmetric, apraxia as well    Time  6    Period  Weeks    Status  On-going    Target Date  03/08/18      PT LONG TERM GOAL #4   Title  Patient will be require no assist with ascend/descend 3 steps using Least restrictive assistive device.    Baseline  02/27/18: Pt requires UE support to ascend/descend steps safely    Time  6    Period  Weeks    Status  On-going    Target Date  03/08/18      PT LONG TERM GOAL #5   Title   Patient will demonstrate an improved Berg Balance Score of > 50/56 as to demonstrate improved balance with ADLs such as sitting/standing and transfer balance and reduced fall risk.     Baseline  02/27/18: 44/56    Time  6    Period  Weeks    Status  New    Target Date  03/08/18  Plan - 02/28/18 1650    Clinical Impression Statement  Patient demonstrates improvement in his outcome measures including his BERG score increasing to 44/56. His 31m walk speed has also improved as well as his Five Time Sit to Stand Test. He continues to present with deficits in bilateral hip abduction and abduction. Unable to test hip extension today due to difficulty laying in prone. Patient continues with upper extremity deficits in coordination as well as apraxia. Goals updated with patient today. Patient will continue to benefit from skiiled PT to improve mobility     Rehab Potential  Fair    Clinical Impairments Affecting Rehab Potential  concerned about progression of early onset alzheimers and how that affects mobility;     PT Frequency  2x / week    PT Duration  6 weeks    PT Treatment/Interventions  ADLs/Self Care Home Management;Cryotherapy;Moist Heat;Therapeutic exercise;Therapeutic activities;Functional mobility training;Stair  training;Gait training;DME Instruction;Balance training;Neuromuscular re-education;Patient/family education;Energy conservation    PT Next Visit Plan  continue with balance and strengthening program    PT Home Exercise Plan  no updates this date.     Consulted and Agree with Plan of Care  Patient       Patient will benefit from skilled therapeutic intervention in order to improve the following deficits and impairments:  Abnormal gait, Decreased endurance, Decreased knowledge of precautions, Decreased activity tolerance, Decreased strength, Pain, Difficulty walking, Decreased mobility, Decreased balance, Decreased safety awareness  Visit Diagnosis: Difficulty in walking, not elsewhere classified  Unsteadiness on feet  Muscle weakness (generalized)     Problem List Patient Active Problem List   Diagnosis Date Noted  . Hypersalivation 01/30/2018  . Drooling 01/30/2018  . Alzheimer's dementia 05/01/2017  . Cognitive decline 10/20/2016  . Chest pain, rule out acute myocardial infarction 04/05/2016  . Confusion 10/18/2015  . Constipation 08/23/2015  . Uncontrolled type 2 diabetes mellitus with hyperglycemia, with long-term current use of insulin ([place]) 08/23/2015  . Essential hypertension 08/23/2015  . Right lower lobe pneumonia ([place]) 08/23/2015  . Abdominal pain 08/21/2015  . Altered mental status 02/19/2015   Phillips Grout PT, DPT, GCS  Huprich,Jason 02/28/2018, 4:58 PM  [place] MAIN [facility] SERVICES [address] [place], [place], [address] Phone: [phone]   Fax:  [phone]  Name: Maurice Patterson MRN: [id number] Date of Birth: 05-Mar-1956

## 2018-03-01 ENCOUNTER — Ambulatory Visit: Payer: PPO | Admitting: Physical Therapy

## 2018-03-01 DIAGNOSIS — R262 Difficulty in walking, not elsewhere classified: Secondary | ICD-10-CM

## 2018-03-01 DIAGNOSIS — R4701 Aphasia: Secondary | ICD-10-CM

## 2018-03-01 DIAGNOSIS — M6281 Muscle weakness (generalized): Secondary | ICD-10-CM

## 2018-03-01 DIAGNOSIS — R41841 Cognitive communication deficit: Secondary | ICD-10-CM

## 2018-03-01 DIAGNOSIS — R278 Other lack of coordination: Secondary | ICD-10-CM

## 2018-03-01 DIAGNOSIS — R2681 Unsteadiness on feet: Secondary | ICD-10-CM

## 2018-03-01 NOTE — Therapy (Signed)
[place] MAIN [facility] SERVICES [address] [place], [place], [address] Phone: [phone]   Fax:  [phone]  Physical Therapy Treatment  Patient Details  Name: Maurice Patterson MRN: [id number] Date of Birth: Jan 12, 1956 Referring Provider: [name], MD   Encounter Date: 03/01/2018  PT End of Session - 03/01/18 1649    Visit Number  7    Number of Visits  13    Date for PT Re-Evaluation  03/08/18    PT Start Time  0445    PT Stop Time  0525    PT Time Calculation (min)  40 min    Equipment Utilized During Treatment  Gait belt    Activity Tolerance  Patient limited by fatigue;Patient limited by pain;Other (comment) limited by dementia    Behavior During Therapy  [facility] for tasks assessed/performed       Past Medical History:  Diagnosis Date  . Acute MI ([place])   . Arthritis   . Back pain   . CAD (coronary artery disease)   . CHF (congestive heart failure) ([place])   . Chronic kidney disease   . Degenerative lumbar disc   . Dementia   . Diabetes mellitus without complication ([place])   . GERD (gastroesophageal reflux disease)   . Hernia of abdominal cavity   . Hyperlipemia   . Hypertension   . Malignant intraductal papillary mucinous tumor of pancreas ([place])   . Memory loss   . Pancreatitis   . Seizures ([place])    staring spells  . Shingles   . Stroke ([place]) 08/2017  . TIA (transient ischemic attack)     Past Surgical History:  Procedure Laterality Date  . CARDIAC CATHETERIZATION    . CORONARY ANGIOPLASTY    . CYSTOSCOPY WITH STENT PLACEMENT Right 05/13/2017   Procedure: CYSTOSCOPY WITH STENT PLACEMENT;  Surgeon: Nickie Retort, MD;  Location: [facility] ORS;  Service: Urology;  Laterality: Right;  . ERCP N/A 09/12/2015   Procedure: ENDOSCOPIC RETROGRADE CHOLANGIOPANCREATOGRAPHY (ERCP);  Surgeon: Hulen Luster, MD;  Location: [facility] END[facility]COPY;  Service: Gastroenterology;  Laterality: N/A;  . ERCP N/A 01/02/2016   Procedure: ENDOSCOPIC  RETROGRADE CHOLANGIOPANCREATOGRAPHY (ERCP);  Surgeon: Hulen Luster, MD;  Location: [facility] ENDOSCOPY;  Service: Gastroenterology;  Laterality: N/A;  . EXTERNAL FIXATION WRIST FRACTURE Left   . left arm metal plate    . LEFT HEART CATH AND CORONARY ANGIOGRAPHY Left 11/04/2016   Procedure: Left Heart Cath and Coronary Angiography;  Surgeon: Isaias Cowman, MD;  Location: [place] CV LAB;  Service: Cardiovascular;  Laterality: Left;  . Left shoulder surgery    . Stent times 3  2013   Cardiac  . TOTAL ELBOW REPLACEMENT Left   . VASECTOMY    . VENTRICULOPERITONEAL SHUNT      There were no vitals filed for this visit.  Subjective Assessment - 03/01/18 1649    Subjective  Pt states that he has a "little back pain" at the moment. He rates his pain as a 5/10 upon arrival. No specific questions or concerns currently    Pertinent History  62 yo Male s/p multiple strokes (most recent in Jan 2019) with impaired memory with early onset alzheimer's dementia. Patient has had PT back in 2017 to work on balance and gait with good results. He reports recently (last few months) he has had trouble getting up and moving around. He denies any recent falls. Family reports that per physcian if he didn't come to PT he would  be wheelchair bound within 6 months. Patient alone during history intake which limited our information. He does present to therapy without AD. He reports catching himself often. He is not driving right now.     Limitations  Walking    How long can you sit comfortably?  NA    How long can you stand comfortably?  10-15 min;     How long can you walk comfortably?  about 400-500 feet with increased left hip/back pain;     Diagnostic tests  MRI has been ordered;     Pain Onset  More than a month ago         Therapeutic exercise:  Ambulation x400 ft without AD at supervision level with  forward flexed posture    TM x 5 mins at . 3  Miles / hour with BUE support   STS transfers from chair x 5  with rest periods  Leg press with 75 lbs x 20 x 2 , heel raises 60 lbs x 20 x 2  Standing marching x 20   Standing step ups from floor to 6 inch stool x 20  CGA and mod verbal cues used throughout with increased in postural sway and LOB most seen with narrow base of support and while on uneven surfaces. Continues to have balance deficits typical with diagnosis.                     PT Education - 03/01/18 1649    Education Details  HEP     Person(s) Educated  Patient    Methods  Explanation    Comprehension  Verbalized understanding       PT Short Term Goals - 02/27/18 1704      PT SHORT TERM GOAL #1   Title  Patient will increase 10 meter walk test to >1.23m/s as to improve gait speed for better community ambulation and to reduce fall risk.    Baseline  02/27/18: 12.9s = 0.78 m/s self selected without assistive device and 11.4s=0.88 m/s fastest speed without assistive device    Time  4    Period  Weeks    Status  On-going    Target Date  02/22/18      PT SHORT TERM GOAL #2   Title  Patient will increase BLE gross strength to 4+/5 as to improve functional strength for independent gait, increased standing tolerance and increased ADL ability.    Baseline  Bilateral hip flexion: 5/5, hip IR: 4+/5, hip ER: 5/5, hip abduction/aduction 4-/5, pt unable to lay prone to test hip extension, knee extension: 5/5, knee flexion: 4+/5, ankle dorsiflexion: 4+/5;     Time  4    Period  Weeks    Status  On-going    Target Date  02/22/18        PT Long Term Goals - 02/27/18 1717      PT LONG TERM GOAL #1   Title  Patient will increase Berg Balance score by > 6 points to demonstrate decreased fall risk during functional activities.    Baseline  02/27/18: 44/56    Time  6    Period  Weeks    Status  Achieved      PT LONG TERM GOAL #2   Title  Patient will be adherent to HEP at least 3x a week to improve functional strength and balance for better safety at home.    Baseline   02/27/18: "every other day"  Time  6    Period  Weeks    Status  Achieved      PT LONG TERM GOAL #3   Title  Patient will be accurate with finger to nose pointing x5 reps to improve fine motor coordination for grasping and using objects in UE when in community such as out to dinner.     Baseline  02/27/18: BUE coordination remains dysmetric, apraxia as well    Time  6    Period  Weeks    Status  On-going    Target Date  03/08/18      PT LONG TERM GOAL #4   Title  Patient will be require no assist with ascend/descend 3 steps using Least restrictive assistive device.    Baseline  02/27/18: Pt requires UE support to ascend/descend steps safely    Time  6    Period  Weeks    Status  On-going    Target Date  03/08/18      PT LONG TERM GOAL #5   Title   Patient will demonstrate an improved Berg Balance Score of > 50/56 as to demonstrate improved balance with ADLs such as sitting/standing and transfer balance and reduced fall risk.     Baseline  02/27/18: 44/56    Time  6    Period  Weeks    Status  New    Target Date  03/08/18            Plan - 03/01/18 1703    Clinical Impression Statement  Pt presents with unsteadiness on uneven surfaces and fatigues with therapeutic exercises. Patient needs assist with exercises and needs CGA assist with balance activities. Patient demonstrates difficulty with static standing and with decreased base of support and increased challenges for UE. Patient tolerated all interventions well this date and will benefit from continued skilled PT interventions to improve strength and balance and decrease risk of falling    Rehab Potential  Fair    Clinical Impairments Affecting Rehab Potential  concerned about progression of early onset alzheimers and how that affects mobility;     PT Frequency  2x / week    PT Duration  6 weeks    PT Treatment/Interventions  ADLs/Self Care Home Management;Cryotherapy;Moist Heat;Therapeutic exercise;Therapeutic  activities;Functional mobility training;Stair training;Gait training;DME Instruction;Balance training;Neuromuscular re-education;Patient/family education;Energy conservation    PT Next Visit Plan  continue with balance and strengthening program    PT Home Exercise Plan  no updates this date.     Consulted and Agree with Plan of Care  Patient       Patient will benefit from skilled therapeutic intervention in order to improve the following deficits and impairments:  Abnormal gait, Decreased endurance, Decreased knowledge of precautions, Decreased activity tolerance, Decreased strength, Pain, Difficulty walking, Decreased mobility, Decreased balance, Decreased safety awareness  Visit Diagnosis: Difficulty in walking, not elsewhere classified  Unsteadiness on feet  Muscle weakness (generalized)  Cognitive communication deficit  Aphasia  Other lack of coordination     Problem List Patient Active Problem List   Diagnosis Date Noted  . Hypersalivation 01/30/2018  . Drooling 01/30/2018  . Alzheimer's dementia 05/01/2017  . Cognitive decline 10/20/2016  . Chest pain, rule out acute myocardial infarction 04/05/2016  . Confusion 10/18/2015  . Constipation 08/23/2015  . Uncontrolled type 2 diabetes mellitus with hyperglycemia, with long-term current use of insulin ([place]) 08/23/2015  . Essential hypertension 08/23/2015  . Right lower lobe pneumonia ([place]) 08/23/2015  . Abdominal pain 08/21/2015  .  Altered mental status 02/19/2015    Alanson Puls, PT DPT 03/01/2018, 5:05 PM  [place] MAIN [facility] SERVICES [address] [place], [place], [address] Phone: [phone]   Fax:  [phone]  Name: Maurice Patterson MRN: [id number] Date of Birth: 17-Nov-1955

## 2018-03-06 ENCOUNTER — Encounter: Payer: Self-pay | Admitting: Physical Therapy

## 2018-03-06 ENCOUNTER — Ambulatory Visit: Payer: PPO | Admitting: Physical Therapy

## 2018-03-06 DIAGNOSIS — M6281 Muscle weakness (generalized): Secondary | ICD-10-CM

## 2018-03-06 DIAGNOSIS — R262 Difficulty in walking, not elsewhere classified: Secondary | ICD-10-CM | POA: Diagnosis not present

## 2018-03-06 DIAGNOSIS — R2681 Unsteadiness on feet: Secondary | ICD-10-CM

## 2018-03-06 NOTE — Therapy (Signed)
[place] MAIN [facility] SERVICES [place], [place], [address] Phone: [phone]   Fax:  [phone]  Physical Therapy Treatment  Patient Details  Name: Maurice Patterson MRN: [id number] Date of Birth: [birth year] Referring Provider: [name], MD   Encounter Date: 03/06/2018  PT End of Session - 03/06/18 1700    Visit Number  8    Number of Visits  13    Date for PT Re-Evaluation  03/08/18    Authorization - Visit Number  --    Authorization - Number of Visits  --    PT Start Time  0451    PT Stop Time  0530    PT Time Calculation (min)  39 min    Equipment Utilized During Treatment  Gait belt    Activity Tolerance  Patient limited by fatigue;Patient limited by pain;Other (comment) limited by dementia    Behavior During Therapy  [facility] for tasks assessed/performed       Past Medical History:  Diagnosis Date  . Acute MI ([place])   . Arthritis   . Back pain   . CAD (coronary artery disease)   . CHF (congestive heart failure) ([place])   . Chronic kidney disease   . Degenerative lumbar disc   . Dementia   . Diabetes mellitus without complication ([place])   . GERD (gastroesophageal reflux disease)   . Hernia of abdominal cavity   . Hyperlipemia   . Hypertension   . Malignant intraductal papillary mucinous tumor of pancreas ([place])   . Memory loss   . Pancreatitis   . Seizures ([place])    staring spells  . Shingles   . Stroke ([place]) 08/2017  . TIA (transient ischemic attack)     Past Surgical History:  Procedure Laterality Date  . CARDIAC CATHETERIZATION    . CORONARY ANGIOPLASTY    . CYSTOSCOPY WITH STENT PLACEMENT Right 05/13/2017   Procedure: CYSTOSCOPY WITH STENT PLACEMENT;  Surgeon: Nickie Retort, MD;  Location: [facility] ORS;  Service: Urology;  Laterality: Right;  . ERCP N/A 09/12/2015   Procedure: ENDOSCOPIC RETROGRADE CHOLANGIOPANCREATOGRAPHY (ERCP);  Surgeon: Hulen Luster, MD;  Location: [facility] ENDOSCOPY;  Service:  Gastroenterology;  Laterality: N/A;  . ERCP N/A 01/02/2016   Procedure: ENDOSCOPIC RETROGRADE CHOLANGIOPANCREATOGRAPHY (ERCP);  Surgeon: Hulen Luster, MD;  Location: [facility] ENDOSCOPY;  Service: Gastroenterology;  Laterality: N/A;  . EXTERNAL FIXATION WRIST FRACTURE Left   . left arm metal plate    . LEFT HEART CATH AND CORONARY ANGIOGRAPHY Left 11/04/2016   Procedure: Left Heart Cath and Coronary Angiography;  Surgeon: Isaias Cowman, MD;  Location: [place] CV LAB;  Service: Cardiovascular;  Laterality: Left;  . Left shoulder surgery    . Stent times 3  2013   Cardiac  . TOTAL ELBOW REPLACEMENT Left   . VASECTOMY    . VENTRICULOPERITONEAL SHUNT      There were no vitals filed for this visit.  Subjective Assessment - 03/06/18 1653    Subjective   He rates his pain as a 5/10 upon arrival. No specific questions or concerns currently    Pertinent History  62 yo Male s/p multiple strokes (most recent in Jan 2019) with impaired memory with early onset alzheimer's dementia. Patient has had PT back in 2017 to work on balance and gait with good results. He reports recently (last few months) he has had trouble getting up and moving around. He denies any recent falls. Family reports that per physcian  if he didn't come to PT he would be wheelchair bound within 6 months. Patient alone during history intake which limited our information. He does present to therapy without AD. He reports catching himself often. He is not driving right now.     Limitations  Walking    How long can you sit comfortably?  NA    How long can you stand comfortably?  10-15 min;     How long can you walk comfortably?  about 400-500 feet with increased left hip/back pain;     Diagnostic tests  MRI has been ordered;     Currently in Pain?  Yes    Pain Score  5     Pain Location  Back    Pain Type  Chronic pain    Pain Onset  More than a month ago    Aggravating Factors   standing    Pain Relieving Factors  nothing     Effect of Pain on Daily Activities  difficult to walk       Therapeutic exercise Leg press 90 lbs x 20 x 2  Marching x 20  Side stepping in parallel bars with UE support x 5 reps 10 feet each Hip extension standing with knee flex x 15 BLE Hip abd sidelying left and right x 15 BLE Bridges x 10 Supine hip abd/add x 15 x 2 BLE Supine SLR x 10 x 2 BLE hooklying hip abd/ER x 20 x 2 Bridging x 10 x 2 Seated knee flex with RTB/ marching in sitting/ LAQ x 10 x 2 Sit to stand with RW x 10 x 2.       TM walking side stepping left and right x . 4 miles / hour   Patient needs occasional verbal cueing to improve posture and cueing to correctly perform exercises slowly, holding at end of range to increase motor firing of desired muscle to encourage fatigue                      PT Education - 03/06/18 1654    Education Details  HEP    Person(s) Educated  Patient    Methods  Explanation    Comprehension  Verbalized understanding;Tactile cues required       PT Short Term Goals - 02/27/18 1704      PT SHORT TERM GOAL #1   Title  Patient will increase 10 meter walk test to >1.8m/s as to improve gait speed for better community ambulation and to reduce fall risk.    Baseline  02/27/18: 12.9s = 0.78 m/s self selected without assistive device and 11.4s=0.88 m/s fastest speed without assistive device    Time  4    Period  Weeks    Status  On-going    Target Date  02/22/18      PT SHORT TERM GOAL #2   Title  Patient will increase BLE gross strength to 4+/5 as to improve functional strength for independent gait, increased standing tolerance and increased ADL ability.    Baseline  Bilateral hip flexion: 5/5, hip IR: 4+/5, hip ER: 5/5, hip abduction/aduction 4-/5, pt unable to lay prone to test hip extension, knee extension: 5/5, knee flexion: 4+/5, ankle dorsiflexion: 4+/5;     Time  4    Period  Weeks    Status  On-going    Target Date  02/22/18        PT Long Term Goals  - 02/27/18 1717  PT LONG TERM GOAL #1   Title  Patient will increase Berg Balance score by > 6 points to demonstrate decreased fall risk during functional activities.    Baseline  02/27/18: 44/56    Time  6    Period  Weeks    Status  Achieved      PT LONG TERM GOAL #2   Title  Patient will be adherent to HEP at least 3x a week to improve functional strength and balance for better safety at home.    Baseline  02/27/18: "every other day"    Time  6    Period  Weeks    Status  Achieved      PT LONG TERM GOAL #3   Title  Patient will be accurate with finger to nose pointing x5 reps to improve fine motor coordination for grasping and using objects in UE when in community such as out to dinner.     Baseline  02/27/18: BUE coordination remains dysmetric, apraxia as well    Time  6    Period  Weeks    Status  On-going    Target Date  03/08/18      PT LONG TERM GOAL #4   Title  Patient will be require no assist with ascend/descend 3 steps using Least restrictive assistive device.    Baseline  02/27/18: Pt requires UE support to ascend/descend steps safely    Time  6    Period  Weeks    Status  On-going    Target Date  03/08/18      PT LONG TERM GOAL #5   Title   Patient will demonstrate an improved Berg Balance Score of > 50/56 as to demonstrate improved balance with ADLs such as sitting/standing and transfer balance and reduced fall risk.     Baseline  02/27/18: 44/56    Time  6    Period  Weeks    Status  New    Target Date  03/08/18            Plan - 03/06/18 1702    Clinical Impression Statement  Pt presents with unsteadiness on uneven surfaces and fatigues with therapeutic exercises. Patient needs assist with exercises and needs CGA assist with balance activities. Patient demonstrates difficulty with static standing and with decreased base of support and increased challenges for UE. Patient tolerated all interventions well this date and will benefit from continued skilled PT  interventions to improve strength and balance and decrease risk of falling    Rehab Potential  Fair    Clinical Impairments Affecting Rehab Potential  concerned about progression of early onset alzheimers and how that affects mobility;     PT Frequency  2x / week    PT Duration  6 weeks    PT Treatment/Interventions  ADLs/Self Care Home Management;Cryotherapy;Moist Heat;Therapeutic exercise;Therapeutic activities;Functional mobility training;Stair training;Gait training;DME Instruction;Balance training;Neuromuscular re-education;Patient/family education;Energy conservation    PT Next Visit Plan  continue with balance and strengthening program    PT Home Exercise Plan  no updates this date.     Consulted and Agree with Plan of Care  Patient       Patient will benefit from skilled therapeutic intervention in order to improve the following deficits and impairments:  Abnormal gait, Decreased endurance, Decreased knowledge of precautions, Decreased activity tolerance, Decreased strength, Pain, Difficulty walking, Decreased mobility, Decreased balance, Decreased safety awareness  Visit Diagnosis: Difficulty in walking, not elsewhere classified  Unsteadiness on feet  Muscle  weakness (generalized)     Problem List Patient Active Problem List   Diagnosis Date Noted  . Hypersalivation 01/30/2018  . Drooling 01/30/2018  . Alzheimer's dementia 05/01/2017  . Cognitive decline 10/20/2016  . Chest pain, rule out acute myocardial infarction 04/05/2016  . Confusion 10/18/2015  . Constipation 08/23/2015  . Uncontrolled type 2 diabetes mellitus with hyperglycemia, with long-term current use of insulin ([place]) 08/23/2015  . Essential hypertension 08/23/2015  . Right lower lobe pneumonia ([place]) 08/23/2015  . Abdominal pain 08/21/2015  . Altered mental status 02/19/2015    Alanson Puls, PT DPT 03/06/2018, 5:03 PM  [place] MAIN [facility] SERVICES [address] [place], [place], [address] Phone: [phone]   Fax:  [phone]  Name: Maurice Patterson MRN: [id number] Date of Birth: 11/27/1955

## 2018-03-08 ENCOUNTER — Ambulatory Visit: Payer: PPO | Admitting: Physical Therapy

## 2018-03-08 ENCOUNTER — Encounter: Payer: Self-pay | Admitting: Physical Therapy

## 2018-03-08 DIAGNOSIS — R41841 Cognitive communication deficit: Secondary | ICD-10-CM

## 2018-03-08 DIAGNOSIS — M6281 Muscle weakness (generalized): Secondary | ICD-10-CM

## 2018-03-08 DIAGNOSIS — R262 Difficulty in walking, not elsewhere classified: Secondary | ICD-10-CM

## 2018-03-08 DIAGNOSIS — R2681 Unsteadiness on feet: Secondary | ICD-10-CM

## 2018-03-08 NOTE — Therapy (Signed)
[facility] SERVICES [address] [place], [place], [address] Phone: [phone]   Fax:  [phone]  Physical Therapy Treatment  Patient Details  Name: Maurice Patterson MRN: [id number] Date of Birth: 26-Jan-1956 Referring Provider: [name], MD   Encounter Date: 03/08/2018  PT End of Session - 03/08/18 1710    Visit Number  9    Number of Visits  13    Date for PT Re-Evaluation  03/08/18    PT Start Time  0450    PT Stop Time  0530    PT Time Calculation (min)  40 min    Equipment Utilized During Treatment  Gait belt    Activity Tolerance  Patient limited by fatigue;Patient limited by pain;Other (comment) limited by dementia    Behavior During Therapy  [facility] for tasks assessed/performed       Past Medical History:  Diagnosis Date  . Acute MI ([place])   . Arthritis   . Back pain   . CAD (coronary artery disease)   . CHF (congestive heart failure) ([place])   . Chronic kidney disease   . Degenerative lumbar disc   . Dementia   . Diabetes mellitus without complication ([place])   . GERD (gastroesophageal reflux disease)   . Hernia of abdominal cavity   . Hyperlipemia   . Hypertension   . Malignant intraductal papillary mucinous tumor of pancreas ([place])   . Memory loss   . Pancreatitis   . Seizures ([place])    staring spells  . Shingles   . Stroke ([place]) 08/2017  . TIA (transient ischemic attack)     Past Surgical History:  Procedure Laterality Date  . CARDIAC CATHETERIZATION    . CORONARY ANGIOPLASTY    . CYSTOSCOPY WITH STENT PLACEMENT Right 05/13/2017   Procedure: CYSTOSCOPY WITH STENT PLACEMENT;  Surgeon: Nickie Retort, MD;  Location: [facility] ORS;  Service: Urology;  Laterality: Right;  . ERCP N/A 09/12/2015   Procedure: ENDOSCOPIC RETROGRADE CHOLANGIOPANCREATOGRAPHY (ERCP);  Surgeon: Hulen Luster, MD;  Location: [facility] ENDOSCOPY;  Service: Gastroenterology;  Laterality: N/A;  . ERCP N/A 01/02/2016   Procedure: ENDOSCOPIC  RETROGRADE CHOLANGIOPANCREATOGRAPHY (ERCP);  Surgeon: Hulen Luster, MD;  Location: [facility] ENDOSCOPY;  Service: Gastroenterology;  Laterality: N/A;  . EXTERNAL FIXATION WRIST FRACTURE Left   . left arm metal plate    . LEFT HEART CATH AND CORONARY ANGIOGRAPHY Left 11/04/2016   Procedure: Left Heart Cath and Coronary Angiography;  Surgeon: Isaias Cowman, MD;  Location: [place] CV LAB;  Service: Cardiovascular;  Laterality: Left;  . Left shoulder surgery    . Stent times 3  2013   Cardiac  . TOTAL ELBOW REPLACEMENT Left   . VASECTOMY    . VENTRICULOPERITONEAL SHUNT      There were no vitals filed for this visit.  Subjective Assessment - 03/08/18 1707    Subjective   He rates his pain as a 5/10 upon arrival. No specific questions or concerns currently    Pertinent History  62 yo Male s/p multiple strokes (most recent in Jan 2019) with impaired memory with early onset alzheimer's dementia. Patient has had PT back in 2017 to work on balance and gait with good results. He reports recently (last few months) he has had trouble getting up and moving around. He denies any recent falls. Family reports that per physcian if he didn't come to PT he would be wheelchair bound within 6 months. Patient alone during history intake  which limited our information. He does present to therapy without AD. He reports catching himself often. He is not driving right now.     Limitations  Walking    How long can you sit comfortably?  NA    How long can you stand comfortably?  10-15 min;     How long can you walk comfortably?  about 400-500 feet with increased left hip/back pain;     Diagnostic tests  MRI has been ordered;     Currently in Pain?  Yes    Pain Score  -- unable to rate his pain    Pain Location  Back    Pain Orientation  Lower    Pain Descriptors / Indicators  Aching    Pain Type  Chronic pain    Pain Onset  More than a month ago    Pain Frequency  Constant    Aggravating Factors   walking     Pain Relieving Factors  sitting    Effect of Pain on Daily Activities  not able to do as much activity         Treatment: Therapeutic exercise; Sitting nu-step x 5 mins  Sitting LAQ x 20 x 2 BLE Sitting hip flex x 20 x 2 BLE sidelying with hip abd x 10 x 2 BLE  Therapeutic activities: TM walking x .3 miles/ hour x  3 mins Sit to stand x 5 with cues to lean fwd and push from the mat Sit to sidelying , sidelying to sit with min assist and max vC for getting his legs up on the mat table Sitting with OTB and hip abd/ER x 20  Knee flex with OTB x 20 BLE Leg press 90 lbs x 20 x 2   Patient has slow motor planning and decreased cognitive awarness and needs increased time to process  Patient needs seated rest periods due to back pain and fatigue.                        PT Education - 03/08/18 1709    Education Details  HEP    Person(s) Educated  Patient    Methods  Explanation;Verbal cues    Comprehension  Verbalized understanding;Verbal cues required;Need further instruction       PT Short Term Goals - 02/27/18 1704      PT SHORT TERM GOAL #1   Title  Patient will increase 10 meter walk test to >1.44m/s as to improve gait speed for better community ambulation and to reduce fall risk.    Baseline  02/27/18: 12.9s = 0.78 m/s self selected without assistive device and 11.4s=0.88 m/s fastest speed without assistive device    Time  4    Period  Weeks    Status  On-going    Target Date  02/22/18      PT SHORT TERM GOAL #2   Title  Patient will increase BLE gross strength to 4+/5 as to improve functional strength for independent gait, increased standing tolerance and increased ADL ability.    Baseline  Bilateral hip flexion: 5/5, hip IR: 4+/5, hip ER: 5/5, hip abduction/aduction 4-/5, pt unable to lay prone to test hip extension, knee extension: 5/5, knee flexion: 4+/5, ankle dorsiflexion: 4+/5;     Time  4    Period  Weeks    Status  On-going    Target Date   02/22/18        PT Long Term Goals -  02/27/18 1717      PT LONG TERM GOAL #1   Title  Patient will increase Berg Balance score by > 6 points to demonstrate decreased fall risk during functional activities.    Baseline  02/27/18: 44/56    Time  6    Period  Weeks    Status  Achieved      PT LONG TERM GOAL #2   Title  Patient will be adherent to HEP at least 3x a week to improve functional strength and balance for better safety at home.    Baseline  02/27/18: "every other day"    Time  6    Period  Weeks    Status  Achieved      PT LONG TERM GOAL #3   Title  Patient will be accurate with finger to nose pointing x5 reps to improve fine motor coordination for grasping and using objects in UE when in community such as out to dinner.     Baseline  02/27/18: BUE coordination remains dysmetric, apraxia as well    Time  6    Period  Weeks    Status  On-going    Target Date  03/08/18      PT LONG TERM GOAL #4   Title  Patient will be require no assist with ascend/descend 3 steps using Least restrictive assistive device.    Baseline  02/27/18: Pt requires UE support to ascend/descend steps safely    Time  6    Period  Weeks    Status  On-going    Target Date  03/08/18      PT LONG TERM GOAL #5   Title   Patient will demonstrate an improved Berg Balance Score of > 50/56 as to demonstrate improved balance with ADLs such as sitting/standing and transfer balance and reduced fall risk.     Baseline  02/27/18: 44/56    Time  6    Period  Weeks    Status  New    Target Date  03/08/18            Plan - 03/08/18 1710    Clinical Impression Statement  Patient instructed in intermediate LE strengthening in standing, supine, and seated. He required mod VCs to improve technique and to improve positioning for better strengthening. Patient has fatigue and pain and needs seated rest periods following exercises.   He would benefit from additional skilled PT intervention to improve balance/gait safety  and reduce fall risk.    Rehab Potential  Fair    Clinical Impairments Affecting Rehab Potential  concerned about progression of early onset alzheimers and how that affects mobility;     PT Frequency  2x / week    PT Duration  6 weeks    PT Treatment/Interventions  ADLs/Self Care Home Management;Cryotherapy;Moist Heat;Therapeutic exercise;Therapeutic activities;Functional mobility training;Stair training;Gait training;DME Instruction;Balance training;Neuromuscular re-education;Patient/family education;Energy conservation    PT Next Visit Plan  continue with balance and strengthening program    PT Home Exercise Plan  no updates this date.     Consulted and Agree with Plan of Care  Patient       Patient will benefit from skilled therapeutic intervention in order to improve the following deficits and impairments:  Abnormal gait, Decreased endurance, Decreased knowledge of precautions, Decreased activity tolerance, Decreased strength, Pain, Difficulty walking, Decreased mobility, Decreased balance, Decreased safety awareness  Visit Diagnosis: Difficulty in walking, not elsewhere classified  Unsteadiness on feet  Muscle weakness (generalized)  Cognitive  communication deficit     Problem List Patient Active Problem List   Diagnosis Date Noted  . Hypersalivation 01/30/2018  . Drooling 01/30/2018  . Alzheimer's dementia 05/01/2017  . Cognitive decline 10/20/2016  . Chest pain, rule out acute myocardial infarction 04/05/2016  . Confusion 10/18/2015  . Constipation 08/23/2015  . Uncontrolled type 2 diabetes mellitus with hyperglycemia, with long-term current use of insulin ([place]) 08/23/2015  . Essential hypertension 08/23/2015  . Right lower lobe pneumonia ([place]) 08/23/2015  . Abdominal pain 08/21/2015  . Altered mental status 02/19/2015    Alanson Puls, PT DPT 03/08/2018, 5:12 PM  [place] MAIN [facility] SERVICES [address]  [place], [place], [address] Phone: [phone]   Fax:  [phone]  Name: Maurice Patterson MRN: [id number] Date of Birth: 1956/02/09

## 2018-03-13 ENCOUNTER — Ambulatory Visit: Payer: PPO | Admitting: Physical Therapy

## 2018-03-13 ENCOUNTER — Encounter: Payer: Self-pay | Admitting: Physical Therapy

## 2018-03-13 DIAGNOSIS — M6281 Muscle weakness (generalized): Secondary | ICD-10-CM

## 2018-03-13 DIAGNOSIS — R262 Difficulty in walking, not elsewhere classified: Secondary | ICD-10-CM

## 2018-03-13 DIAGNOSIS — R2681 Unsteadiness on feet: Secondary | ICD-10-CM

## 2018-03-13 NOTE — Therapy (Signed)
[place] MAIN [facility] SERVICES [address] [place], [place], [address] Phone: [phone]   Fax:  [phone]  Physical Therapy Treatment Physical Therapy Progress Note   Dates of reporting period  01/25/18  to  03/13/18  Patient Details  Name: Maurice Patterson MRN: [id number] Date of Birth: [birth year] Referring Provider: [name], MD   Encounter Date: 03/13/2018  PT End of Session - 03/13/18 1743    Visit Number  10    Number of Visits  13    Date for PT Re-Evaluation  05/03/18    PT Start Time  0500    PT Stop Time  5625    PT Time Calculation (min)  45 min    Equipment Utilized During Treatment  Gait belt    Activity Tolerance  Patient limited by fatigue;Patient limited by pain;Other (comment) limited by dementia    Behavior During Therapy  [facility] for tasks assessed/performed       Past Medical History:  Diagnosis Date  . Acute MI ([place])   . Arthritis   . Back pain   . CAD (coronary artery disease)   . CHF (congestive heart failure) ([place])   . Chronic kidney disease   . Degenerative lumbar disc   . Dementia   . Diabetes mellitus without complication ([employer])   . GERD (gastroesophageal reflux disease)   . Hernia of abdominal cavity   . Hyperlipemia   . Hypertension   . Malignant intraductal papillary mucinous tumor of pancreas ([place])   . Memory loss   . Pancreatitis   . Seizures ([place])    staring spells  . Shingles   . Stroke ([place]) 08/2017  . TIA (transient ischemic attack)     Past Surgical History:  Procedure Laterality Date  . CARDIAC CATHETERIZATION    . CORONARY ANGIOPLASTY    . CYSTOSCOPY WITH STENT PLACEMENT Right 05/13/2017   Procedure: CYSTOSCOPY WITH STENT PLACEMENT;  Surgeon: Nickie Retort, MD;  Location: [facility] ORS;  Service: Urology;  Laterality: Right;  . ERCP N/A 09/12/2015   Procedure: ENDOSCOPIC RETROGRADE CHOLANGIOPANCREATOGRAPHY (ERCP);  Surgeon: Hulen Luster, MD;  Location: [facility] ENDOSCOPY;  Service:  Gastroenterology;  Laterality: N/A;  . ERCP N/A 01/02/2016   Procedure: ENDOSCOPIC RETROGRADE CHOLANGIOPANCREATOGRAPHY (ERCP);  Surgeon: Hulen Luster, MD;  Location: [facility] ENDOSCOPY;  Service: Gastroenterology;  Laterality: N/A;  . EXTERNAL FIXATION WRIST FRACTURE Left   . left arm metal plate    . LEFT HEART CATH AND CORONARY ANGIOGRAPHY Left 11/04/2016   Procedure: Left Heart Cath and Coronary Angiography;  Surgeon: Isaias Cowman, MD;  Location: [place] CV LAB;  Service: Cardiovascular;  Laterality: Left;  . Left shoulder surgery    . Stent times 3  2013   Cardiac  . TOTAL ELBOW REPLACEMENT Left   . VASECTOMY    . VENTRICULOPERITONEAL SHUNT      There were no vitals filed for this visit.  Subjective Assessment - 03/13/18 1742    Subjective   He rates his pain as a 310 upon arrival. No specific questions or concerns currently    Pertinent History  62 yo Male s/p multiple strokes (most recent in Jan 2019) with impaired memory with early onset alzheimer's dementia. Patient has had PT back in 2017 to work on balance and gait with good results. He reports recently (last few months) he has had trouble getting up and moving around. He denies any recent falls. Family reports that per physcian if he didn't  come to PT he would be wheelchair bound within 6 months. Patient alone during history intake which limited our information. He does present to therapy without AD. He reports catching himself often. He is not driving right now.     Limitations  Walking    How long can you sit comfortably?  NA    How long can you stand comfortably?  10-15 min;     How long can you walk comfortably?  about 400-500 feet with increased left hip/back pain;     Diagnostic tests  MRI has been ordered;     Currently in Pain?  Yes    Pain Score  3     Pain Location  Back    Pain Descriptors / Indicators  Aching    Pain Onset  More than a month ago    Multiple Pain Sites  No         Therapeutic  exercise; Hip extension standing with knee flex x 15 BLE Hip add sidelying left and right x 15 BLE Bridges x 10 Standing hip add with RTB x 10 BLE leg press 90 lbs x 20 x 2 Standing squats x 20 x 2 Heel raises x 30  foam  and balance with head turns left and right feet apart and feet together,cues for better posture Side stepping left and right x 10 lengths, cues for going slowly and she ups from floor to 6 inch stool x 20 bilateral marching in parallel bars x 20, cues for bigger steps needs UE support with LOB backwards   CGA and Min to mod verbal cues used throughout with increased in postural sway and LOB most seen with narrow base of support and while on uneven surfaces. Continues to have balance deficits typical with diagnosis. Patient performs intermediate level exercises without pain behaviors and needs verbal cuing for postural alignment and head positioning                      PT Education - 03/13/18 1743    Education Details  HEP    Person(s) Educated  Patient    Methods  Explanation;Demonstration;Verbal cues;Tactile cues    Comprehension  Verbalized understanding;Returned demonstration;Verbal cues required       PT Short Term Goals - 02/27/18 1704      PT SHORT TERM GOAL #1   Title  Patient will increase 10 meter walk test to >1.74ms as to improve gait speed for better community ambulation and to reduce fall risk.    Baseline  02/27/18: 12.9s = 0.78 m/s self selected without assistive device and 11.4s=0.88 m/s fastest speed without assistive device    Time  4    Period  Weeks    Status  On-going    Target Date  02/22/18      PT SHORT TERM GOAL #2   Title  Patient will increase BLE gross strength to 4+/5 as to improve functional strength for independent gait, increased standing tolerance and increased ADL ability.    Baseline  Bilateral hip flexion: 5/5, hip IR: 4+/5, hip ER: 5/5, hip abduction/aduction 4-/5, pt unable to lay prone to test hip extension,  knee extension: 5/5, knee flexion: 4+/5, ankle dorsiflexion: 4+/5;     Time  4    Period  Weeks    Status  On-going    Target Date  02/22/18        PT Long Term Goals - 03/14/18 0907      PT LONG  TERM GOAL #1   Title  Patient will increase Berg Balance score by > 6 points to demonstrate decreased fall risk during functional activities.    Baseline  02/27/18: 44/56    Time  6    Period  Weeks    Status  Achieved      PT LONG TERM GOAL #2   Title  Patient will be adherent to HEP at least 3x a week to improve functional strength and balance for better safety at home.    Baseline  02/27/18: "every other day"    Time  6    Period  Weeks    Status  Achieved      PT LONG TERM GOAL #3   Title  Patient will be accurate with finger to nose pointing x5 reps to improve fine motor coordination for grasping and using objects in UE when in community such as out to dinner.     Baseline  02/27/18: BUE coordination remains dysmetric, apraxia as well    Time  8    Period  Weeks    Status  On-going    Target Date  05/09/18      PT LONG TERM GOAL #4   Title  Patient will be require no assist with ascend/descend 3 steps using Least restrictive assistive device.    Baseline  02/27/18: Pt requires UE support to ascend/descend steps safely    Time  6    Period  Weeks    Status  On-going    Target Date  05/09/18      PT LONG TERM GOAL #5   Title   Patient will demonstrate an improved Berg Balance Score of > 50/56 as to demonstrate improved balance with ADLs such as sitting/standing and transfer balance and reduced fall risk.     Baseline  02/27/18: 44/56    Time  6    Period  Weeks    Status  Partially Met    Target Date  05/09/18            Plan - 03/13/18 1744    Clinical Impression Statement Patient's condition has the potential to improve in response to therapy. Maximum improvement is yet to be obtained. The anticipated improvement is attainable and reasonable in a generally predictable  time. Start date of reporting period  01/25/18 end date of reporting period  03/13/18.Marland Kitchen Patient reports that he is doing somewhat better.   Pt presents with unsteadiness on uneven surfaces and fatigues with therapeutic exercises. Patient needs assist with exercises and needs CGA assist with balance activities. Patient demonstrates difficulty with static standing and with decreased base of support and increased challenges for UE. Patient tolerated all interventions well this date and will benefit from continued skilled PT interventions to improve strength and balance and decrease risk of falling    Rehab Potential  Fair    Clinical Impairments Affecting Rehab Potential  concerned about progression of early onset alzheimers and how that affects mobility;     PT Frequency  2x / week    PT Duration  6 weeks    PT Treatment/Interventions  ADLs/Self Care Home Management;Cryotherapy;Moist Heat;Therapeutic exercise;Therapeutic activities;Functional mobility training;Stair training;Gait training;DME Instruction;Balance training;Neuromuscular re-education;Patient/family education;Energy conservation    PT Next Visit Plan  continue with balance and strengthening program    PT Home Exercise Plan  no updates this date.     Consulted and Agree with Plan of Care  Patient       Patient will benefit  from skilled therapeutic intervention in order to improve the following deficits and impairments:  Abnormal gait, Decreased endurance, Decreased knowledge of precautions, Decreased activity tolerance, Decreased strength, Pain, Difficulty walking, Decreased mobility, Decreased balance, Decreased safety awareness  Visit Diagnosis: Difficulty in walking, not elsewhere classified  Unsteadiness on feet  Muscle weakness (generalized)     Problem List Patient Active Problem List   Diagnosis Date Noted  . Hypersalivation 01/30/2018  . Drooling 01/30/2018  . Alzheimer's dementia 05/01/2017  . Cognitive decline  10/20/2016  . Chest pain, rule out acute myocardial infarction 04/05/2016  . Confusion 10/18/2015  . Constipation 08/23/2015  . Uncontrolled type 2 diabetes mellitus with hyperglycemia, with long-term current use of insulin ([place]) 08/23/2015  . Essential hypertension 08/23/2015  . Right lower lobe pneumonia ([place]) 08/23/2015  . Abdominal pain 08/21/2015  . Altered mental status 02/19/2015    Alanson Puls, PT DPT 03/14/2018, 9:08 AM  [place] MAIN [facility] SERVICES [address] [place], [place], [address] Phone: [phone]   Fax:  [phone]  Name: MADDAX PALINKAS MRN: [id number] Date of Birth: Nov 16, 1955

## 2018-03-15 ENCOUNTER — Encounter: Payer: Self-pay | Admitting: Physical Therapy

## 2018-03-15 ENCOUNTER — Ambulatory Visit: Payer: PPO | Admitting: Physical Therapy

## 2018-03-15 DIAGNOSIS — R41841 Cognitive communication deficit: Secondary | ICD-10-CM

## 2018-03-15 DIAGNOSIS — R262 Difficulty in walking, not elsewhere classified: Secondary | ICD-10-CM

## 2018-03-15 DIAGNOSIS — R4701 Aphasia: Secondary | ICD-10-CM

## 2018-03-15 DIAGNOSIS — R2681 Unsteadiness on feet: Secondary | ICD-10-CM

## 2018-03-15 DIAGNOSIS — M6281 Muscle weakness (generalized): Secondary | ICD-10-CM

## 2018-03-15 NOTE — Therapy (Signed)
[place] MAIN [facility] SERVICES [address] [place], [place], [address] Phone: [phone]   Fax:  [phone]  Physical Therapy Treatment  Patient Details  Name: Maurice Patterson MRN: [id number] Date of Birth: 1955-08-27 Referring Provider: [name], MD   Encounter Date: 03/15/2018  PT End of Session - 03/15/18 1712    Visit Number  11    Number of Visits  30    Date for PT Re-Evaluation  05/03/18    PT Start Time  0455    PT Stop Time  0533    PT Time Calculation (min)  38 min    Equipment Utilized During Treatment  Gait belt    Activity Tolerance  Patient limited by fatigue;Patient limited by pain;Other (comment) limited by dementia    Behavior During Therapy  [facility] for tasks assessed/performed       Past Medical History:  Diagnosis Date  . Acute MI ([place])   . Arthritis   . Back pain   . CAD (coronary artery disease)   . CHF (congestive heart failure) ([place])   . Chronic kidney disease   . Degenerative lumbar disc   . Dementia   . Diabetes mellitus without complication ([place])   . GERD (gastroesophageal reflux disease)   . Hernia of abdominal cavity   . Hyperlipemia   . Hypertension   . Malignant intraductal papillary mucinous tumor of pancreas ([place])   . Memory loss   . Pancreatitis   . Seizures ([place])    staring spells  . Shingles   . Stroke ([place]) 08/2017  . TIA (transient ischemic attack)     Past Surgical History:  Procedure Laterality Date  . CARDIAC CATHETERIZATION    . CORONARY ANGIOPLASTY    . CYSTOSCOPY WITH STENT PLACEMENT Right 05/13/2017   Procedure: CYSTOSCOPY WITH STENT PLACEMENT;  Surgeon: Nickie Retort, MD;  Location: [facility] ORS;  Service: Urology;  Laterality: Right;  . ERCP N/A 09/12/2015   Procedure: ENDOSCOPIC RETROGRADE CHOLANGIOPANCREATOGRAPHY (ERCP);  Surgeon: Hulen Luster, MD;  Location: [facility] ENDOSCOPY;  Service: Gastroenterology;  Laterality: N/A;  . ERCP N/A 01/02/2016   Procedure: ENDOSCOPIC  RETROGRADE CHOLANGIOPANCREATOGRAPHY (ERCP);  Surgeon: Hulen Luster, MD;  Location: [facility] ENDOSCOPY;  Service: Gastroenterology;  Laterality: N/A;  . EXTERNAL FIXATION WRIST FRACTURE Left   . left arm metal plate    . LEFT HEART CATH AND CORONARY ANGIOGRAPHY Left 11/04/2016   Procedure: Left Heart Cath and Coronary Angiography;  Surgeon: Isaias Cowman, MD;  Location: [place] CV LAB;  Service: Cardiovascular;  Laterality: Left;  . Left shoulder surgery    . Stent times 3  2013   Cardiac  . TOTAL ELBOW REPLACEMENT Left   . VASECTOMY    . VENTRICULOPERITONEAL SHUNT      There were no vitals filed for this visit.  Subjective Assessment - 03/15/18 1711    Subjective   He rates his pain as a 310 upon arrival. No specific questions or concerns currently    Pertinent History  62 yo Male s/p multiple strokes (most recent in Jan 2019) with impaired memory with early onset alzheimer's dementia. Patient has had PT back in 2017 to work on balance and gait with good results. He reports recently (last few months) he has had trouble getting up and moving around. He denies any recent falls. Family reports that per physcian if he didn't come to PT he would be wheelchair bound within 6 months. Patient alone during history intake  which limited our information. He does present to therapy without AD. He reports catching himself often. He is not driving right now.     Limitations  Walking    How long can you sit comfortably?  NA    How long can you stand comfortably?  10-15 min;     How long can you walk comfortably?  about 400-500 feet with increased left hip/back pain;     Diagnostic tests  MRI has been ordered;     Currently in Pain?  No/denies    Pain Score  0-No pain    Pain Onset  More than a month ago       Therapeutic exercise; Leg press x 90 lbs x 20 x 3 sets  Neuromuscular training:  Balance beam side stepping left and right  x 2 mins each  standing on foam and toe tapping fwd to  stepping stones  x 4 mins alternating LE's  Stepping  fwd /bwd over 1/2 foam   x 2 mins   Purple foam and tapping to 6 inch step x 20  Stepping over hurdle x 20 fwd/bwd, side to side  Matrix fwd/bwd x 3 , side to side x 3 reps  .   CGA and Min to mod verbal cues used throughout with increased in postural sway and LOB most seen with narrow base of support and while on uneven surfaces. Continues to have balance deficits typical with diagnosis. Patient performs intermediate level exercises without pain behaviors.                        PT Education - 03/15/18 1712    Education Details  HEP    Person(s) Educated  Patient    Methods  Explanation;Demonstration;Verbal cues    Comprehension  Verbalized understanding;Returned demonstration;Verbal cues required       PT Short Term Goals - 02/27/18 1704      PT SHORT TERM GOAL #1   Title  Patient will increase 10 meter walk test to >1.31ms as to improve gait speed for better community ambulation and to reduce fall risk.    Baseline  02/27/18: 12.9s = 0.78 m/s self selected without assistive device and 11.4s=0.88 m/s fastest speed without assistive device    Time  4    Period  Weeks    Status  On-going    Target Date  02/22/18      PT SHORT TERM GOAL #2   Title  Patient will increase BLE gross strength to 4+/5 as to improve functional strength for independent gait, increased standing tolerance and increased ADL ability.    Baseline  Bilateral hip flexion: 5/5, hip IR: 4+/5, hip ER: 5/5, hip abduction/aduction 4-/5, pt unable to lay prone to test hip extension, knee extension: 5/5, knee flexion: 4+/5, ankle dorsiflexion: 4+/5;     Time  4    Period  Weeks    Status  On-going    Target Date  02/22/18        PT Long Term Goals - 03/14/18 0907      PT LONG TERM GOAL #1   Title  Patient will increase Berg Balance score by > 6 points to demonstrate decreased fall risk during functional activities.    Baseline   02/27/18: 44/56    Time  6    Period  Weeks    Status  Achieved      PT LONG TERM GOAL #2   Title  Patient  will be adherent to HEP at least 3x a week to improve functional strength and balance for better safety at home.    Baseline  02/27/18: "every other day"    Time  6    Period  Weeks    Status  Achieved      PT LONG TERM GOAL #3   Title  Patient will be accurate with finger to nose pointing x5 reps to improve fine motor coordination for grasping and using objects in UE when in community such as out to dinner.     Baseline  02/27/18: BUE coordination remains dysmetric, apraxia as well    Time  8    Period  Weeks    Status  On-going    Target Date  05/09/18      PT LONG TERM GOAL #4   Title  Patient will be require no assist with ascend/descend 3 steps using Least restrictive assistive device.    Baseline  02/27/18: Pt requires UE support to ascend/descend steps safely    Time  6    Period  Weeks    Status  On-going    Target Date  05/09/18      PT LONG TERM GOAL #5   Title   Patient will demonstrate an improved Berg Balance Score of > 50/56 as to demonstrate improved balance with ADLs such as sitting/standing and transfer balance and reduced fall risk.     Baseline  02/27/18: 44/56    Time  6    Period  Weeks    Status  Partially Met    Target Date  05/09/18            Plan - 03/15/18 1714    Clinical Impression Statement  Pt was able to perform all exercises today with CGA.Marland Kitchen Pt was able to perform all balance and strength exercises, demonstrating improvements in LE strength and stability.  Pt was able to complete dynamic balance exercises, showing ability to stand on uneven surfaces without LOB, min asssit and improve postural reactions to correct self during activities.  Pt requires verbal, visual and tactile cues during exercise in order to complete tasks with proper form and technique, as well as to stay on task.  Pt would continue to benefit from skilled PT services in order  to further strengthen LE's, improve static and dynamic balance, and improve coordination in order to increase functional mobility and decrease risk of falls    Rehab Potential  Fair    Clinical Impairments Affecting Rehab Potential  concerned about progression of early onset alzheimers and how that affects mobility;     PT Frequency  2x / week    PT Duration  8 weeks    PT Treatment/Interventions  ADLs/Self Care Home Management;Cryotherapy;Moist Heat;Therapeutic exercise;Therapeutic activities;Functional mobility training;Stair training;Gait training;DME Instruction;Balance training;Neuromuscular re-education;Patient/family education;Energy conservation    PT Next Visit Plan  continue with balance and strengthening program    PT Home Exercise Plan  no updates this date.     Consulted and Agree with Plan of Care  Patient       Patient will benefit from skilled therapeutic intervention in order to improve the following deficits and impairments:  Abnormal gait, Decreased endurance, Decreased knowledge of precautions, Decreased activity tolerance, Decreased strength, Pain, Difficulty walking, Decreased mobility, Decreased balance, Decreased safety awareness  Visit Diagnosis: Difficulty in walking, not elsewhere classified  Unsteadiness on feet  Muscle weakness (generalized)  Cognitive communication deficit  Aphasia     Problem List  Patient Active Problem List   Diagnosis Date Noted  . Hypersalivation 01/30/2018  . Drooling 01/30/2018  . Alzheimer's dementia 05/01/2017  . Cognitive decline 10/20/2016  . Chest pain, rule out acute myocardial infarction 04/05/2016  . Confusion 10/18/2015  . Constipation 08/23/2015  . Uncontrolled type 2 diabetes mellitus with hyperglycemia, with long-term current use of insulin ([place]) 08/23/2015  . Essential hypertension 08/23/2015  . Right lower lobe pneumonia ([place]) 08/23/2015  . Abdominal pain 08/21/2015  . Altered mental status 02/19/2015     Alanson Puls, PT DPT 03/15/2018, 5:54 PM  [place] MAIN [facility] SERVICES [address] [place], [place], [address] Phone: [phone]   Fax:  [phone]  Name: Maurice Patterson MRN: [id number] Date of Birth: 07/03/1956

## 2018-03-20 ENCOUNTER — Encounter: Payer: Self-pay | Admitting: Physical Therapy

## 2018-03-20 ENCOUNTER — Ambulatory Visit: Payer: PPO | Admitting: Physical Therapy

## 2018-03-20 DIAGNOSIS — R262 Difficulty in walking, not elsewhere classified: Secondary | ICD-10-CM | POA: Diagnosis not present

## 2018-03-20 DIAGNOSIS — M6281 Muscle weakness (generalized): Secondary | ICD-10-CM

## 2018-03-20 DIAGNOSIS — R2681 Unsteadiness on feet: Secondary | ICD-10-CM

## 2018-03-20 NOTE — Therapy (Signed)
[place] MAIN [facility] SERVICES [address] [place], [place], [address] Phone: [phone]   Fax:  [phone]  Physical Therapy Treatment  Patient Details  Name: Maurice Patterson MRN: [id number] Date of Birth: [birth year] Referring Provider: [name], MD   Encounter Date: 03/20/2018  PT End of Session - 03/20/18 1715    Visit Number  12    Number of Visits  30    Date for PT Re-Evaluation  05/03/18    PT Start Time  0500    PT Stop Time  0762    PT Time Calculation (min)  45 min    Equipment Utilized During Treatment  Gait belt    Activity Tolerance  Patient limited by fatigue;Patient limited by pain;Other (comment) limited by dementia    Behavior During Therapy  [facility] for tasks assessed/performed       Past Medical History:  Diagnosis Date  . Acute MI ([place])   . Arthritis   . Back pain   . CAD (coronary artery disease)   . CHF (congestive heart failure) ([place])   . Chronic kidney disease   . Degenerative lumbar disc   . Dementia   . Diabetes mellitus without complication ([place])   . GERD (gastroesophageal reflux disease)   . Hernia of abdominal cavity   . Hyperlipemia   . Hypertension   . Malignant intraductal papillary mucinous tumor of pancreas ([place])   . Memory loss   . Pancreatitis   . Seizures ([place])    staring spells  . Shingles   . Stroke ([place]) 08/2017  . TIA (transient ischemic attack)     Past Surgical History:  Procedure Laterality Date  . CARDIAC CATHETERIZATION    . CORONARY ANGIOPLASTY    . CYSTOSCOPY WITH STENT PLACEMENT Right 05/13/2017   Procedure: CYSTOSCOPY WITH STENT PLACEMENT;  Surgeon: Nickie Retort, MD;  Location: [facility] ORS;  Service: Urology;  Laterality: Right;  . ERCP N/A 09/12/2015   Procedure: ENDOSCOPIC RETROGRADE CHOLANGIOPANCREATOGRAPHY (ERCP);  Surgeon: Hulen Luster, MD;  Location: [facility] ENDOSCOPY;  Service: Gastroenterology;  Laterality: N/A;  . ERCP N/A 01/02/2016   Procedure: ENDOSCOPIC  RETROGRADE CHOLANGIOPANCREATOGRAPHY (ERCP);  Surgeon: Hulen Luster, MD;  Location: [facility] ENDOSCOPY;  Service: Gastroenterology;  Laterality: N/A;  . EXTERNAL FIXATION WRIST FRACTURE Left   . left arm metal plate    . LEFT HEART CATH AND CORONARY ANGIOGRAPHY Left 11/04/2016   Procedure: Left Heart Cath and Coronary Angiography;  Surgeon: Isaias Cowman, MD;  Location: [place] CV LAB;  Service: Cardiovascular;  Laterality: Left;  . Left shoulder surgery    . Stent times 3  2013   Cardiac  . TOTAL ELBOW REPLACEMENT Left   . VASECTOMY    . VENTRICULOPERITONEAL SHUNT      There were no vitals filed for this visit.  Subjective Assessment - 03/20/18 1713    Subjective   He rates his pain as a 310 upon arrival. No specific questions or concerns currently    Pertinent History  62 yo Male s/p multiple strokes (most recent in Jan 2019) with impaired memory with early onset alzheimer's dementia. Patient has had PT back in 2017 to work on balance and gait with good results. He reports recently (last few months) he has had trouble getting up and moving around. He denies any recent falls. Family reports that per physcian if he didn't come to PT he would be wheelchair bound within 6 months. Patient alone during history intake  which limited our information. He does present to therapy without AD. He reports catching himself often. He is not driving right now.     Limitations  Walking    How long can you sit comfortably?  NA    How long can you stand comfortably?  10-15 min;     How long can you walk comfortably?  about 400-500 feet with increased left hip/back pain;     Diagnostic tests  MRI has been ordered;     Pain Score  5     Pain Location  Back    Pain Orientation  Lower    Pain Descriptors / Indicators  Aching    Pain Onset  More than a month ago    Multiple Pain Sites  No       Treatment: TM walking at . 5 miles/ hour x 5 mins x 2 sets with VC to take longer steps and heel toe  gait  Instructed patient in balance exercise: Standing on airex: Heel/toe raises x15 with 2 rail assist  Alternate toe taps on 4inch step x10 with 1-0 rail assist- required max VCs and visual cues for correct technique/sequencing.Patient had significant difficulty letting go of one rail often reaching out with both hands for rail support;  Side stepping in parallel bars with decreased use of UE assist x 5 repetitions  Sit<>Stand from low mat table with small ball in hands, 2x5 with cues to increase push through LE for better LE strengthening; Patient had increased difficulty with increased repetition   Feet slightly apart, flat surface eyes open head turns, x1 min each with CGA for safety; Modified tandem stance, eyes open with min A  X 1 min and cues to improve weight shift for better stance control; Modified tandem stance, head turns side/side, up/down x 5 reps each, with min A for safety for less loss of balance                    PT Education - 03/20/18 1714    Education Details  HEP    Person(s) Educated  Patient    Methods  Explanation;Demonstration    Comprehension  Verbalized understanding;Returned demonstration       PT Short Term Goals - 02/27/18 1704      PT SHORT TERM GOAL #1   Title  Patient will increase 10 meter walk test to >1.42ms as to improve gait speed for better community ambulation and to reduce fall risk.    Baseline  02/27/18: 12.9s = 0.78 m/s self selected without assistive device and 11.4s=0.88 m/s fastest speed without assistive device    Time  4    Period  Weeks    Status  On-going    Target Date  02/22/18      PT SHORT TERM GOAL #2   Title  Patient will increase BLE gross strength to 4+/5 as to improve functional strength for independent gait, increased standing tolerance and increased ADL ability.    Baseline  Bilateral hip flexion: 5/5, hip IR: 4+/5, hip ER: 5/5, hip abduction/aduction 4-/5, pt unable to lay prone to test hip extension,  knee extension: 5/5, knee flexion: 4+/5, ankle dorsiflexion: 4+/5;     Time  4    Period  Weeks    Status  On-going    Target Date  02/22/18        PT Long Term Goals - 03/14/18 0907      PT LONG TERM GOAL #1  Title  Patient will increase Berg Balance score by > 6 points to demonstrate decreased fall risk during functional activities.    Baseline  02/27/18: 44/56    Time  6    Period  Weeks    Status  Achieved      PT LONG TERM GOAL #2   Title  Patient will be adherent to HEP at least 3x a week to improve functional strength and balance for better safety at home.    Baseline  02/27/18: "every other day"    Time  6    Period  Weeks    Status  Achieved      PT LONG TERM GOAL #3   Title  Patient will be accurate with finger to nose pointing x5 reps to improve fine motor coordination for grasping and using objects in UE when in community such as out to dinner.     Baseline  02/27/18: BUE coordination remains dysmetric, apraxia as well    Time  8    Period  Weeks    Status  On-going    Target Date  05/09/18      PT LONG TERM GOAL #4   Title  Patient will be require no assist with ascend/descend 3 steps using Least restrictive assistive device.    Baseline  02/27/18: Pt requires UE support to ascend/descend steps safely    Time  6    Period  Weeks    Status  On-going    Target Date  05/09/18      PT LONG TERM GOAL #5   Title   Patient will demonstrate an improved Berg Balance Score of > 50/56 as to demonstrate improved balance with ADLs such as sitting/standing and transfer balance and reduced fall risk.     Baseline  02/27/18: 44/56    Time  6    Period  Weeks    Status  Partially Met    Target Date  05/09/18            Plan - 03/20/18 1715    Clinical Impression Statement  Patient instructed in intermediate balance/strengthening exercise. Patient fatigues quickly requiring short rest breaks in between intermediate exercise. Patient instructed in advanced strengthening with  increased repetition/resistance. Patient requires CGA for intermediate balance exercise especially with less rail assist. Patient would benefit from additional skilled PT intervention to improve balance/gait safety and reduce fall risk.    Rehab Potential  Fair    Clinical Impairments Affecting Rehab Potential  concerned about progression of early onset alzheimers and how that affects mobility;     PT Frequency  2x / week    PT Duration  8 weeks    PT Treatment/Interventions  ADLs/Self Care Home Management;Cryotherapy;Moist Heat;Therapeutic exercise;Therapeutic activities;Functional mobility training;Stair training;Gait training;DME Instruction;Balance training;Neuromuscular re-education;Patient/family education;Energy conservation    PT Next Visit Plan  continue with balance and strengthening program    PT Home Exercise Plan  no updates this date.     Consulted and Agree with Plan of Care  Patient       Patient will benefit from skilled therapeutic intervention in order to improve the following deficits and impairments:  Abnormal gait, Decreased endurance, Decreased knowledge of precautions, Decreased activity tolerance, Decreased strength, Pain, Difficulty walking, Decreased mobility, Decreased balance, Decreased safety awareness  Visit Diagnosis: Difficulty in walking, not elsewhere classified  Unsteadiness on feet  Muscle weakness (generalized)     Problem List Patient Active Problem List   Diagnosis Date Noted  .  Hypersalivation 01/30/2018  . Drooling 01/30/2018  . Alzheimer's dementia 05/01/2017  . Cognitive decline 10/20/2016  . Chest pain, rule out acute myocardial infarction 04/05/2016  . Confusion 10/18/2015  . Constipation 08/23/2015  . Uncontrolled type 2 diabetes mellitus with hyperglycemia, with long-term current use of insulin ([place]) 08/23/2015  . Essential hypertension 08/23/2015  . Right lower lobe pneumonia ([place]) 08/23/2015  . Abdominal pain 08/21/2015  .  Altered mental status 02/19/2015    Alanson Puls, PT DPT 03/20/2018, 5:16 PM  [place] MAIN [facility] SERVICES [address] [place], [place], [address] Phone: [phone]   Fax:  [phone]  Name: Maurice Patterson MRN: [id number] Date of Birth: [birth year]

## 2018-03-22 ENCOUNTER — Encounter: Payer: Self-pay | Admitting: Physical Therapy

## 2018-03-22 ENCOUNTER — Ambulatory Visit: Payer: PPO | Admitting: Physical Therapy

## 2018-03-22 DIAGNOSIS — R262 Difficulty in walking, not elsewhere classified: Secondary | ICD-10-CM | POA: Diagnosis not present

## 2018-03-22 DIAGNOSIS — R2681 Unsteadiness on feet: Secondary | ICD-10-CM

## 2018-03-22 DIAGNOSIS — R41841 Cognitive communication deficit: Secondary | ICD-10-CM

## 2018-03-22 DIAGNOSIS — M6281 Muscle weakness (generalized): Secondary | ICD-10-CM

## 2018-03-22 NOTE — Therapy (Signed)
[place] MAIN [facility] SERVICES [address] [place], [place], [address] Phone: [phone]   Fax:  [phone]  Physical Therapy Treatment  Patient Details  Name: Maurice Patterson MRN: [id number] Date of Birth: 1955/10/01 Referring Provider: [name], MD   Encounter Date: 03/22/2018  PT End of Session - 03/22/18 1705    Visit Number  13    Number of Visits  30    Date for PT Re-Evaluation  05/03/18    PT Start Time  0455    PT Stop Time  0540    PT Time Calculation (min)  45 min    Equipment Utilized During Treatment  Gait belt    Activity Tolerance  Patient limited by fatigue;Patient limited by pain;Other (comment) limited by dementia    Behavior During Therapy  [facility] for tasks assessed/performed       Past Medical History:  Diagnosis Date  . Acute MI ([place])   . Arthritis   . Back pain   . CAD (coronary artery disease)   . CHF (congestive heart failure) ([place])   . Chronic kidney disease   . Degenerative lumbar disc   . Dementia   . Diabetes mellitus without complication ([place])   . GERD (gastroesophageal reflux disease)   . Hernia of abdominal cavity   . Hyperlipemia   . Hypertension   . Malignant intraductal papillary mucinous tumor of pancreas ([place])   . Memory loss   . Pancreatitis   . Seizures ([place])    staring spells  . Shingles   . Stroke ([place]) 08/2017  . TIA (transient ischemic attack)     Past Surgical History:  Procedure Laterality Date  . CARDIAC CATHETERIZATION    . CORONARY ANGIOPLASTY    . CYSTOSCOPY WITH STENT PLACEMENT Right 05/13/2017   Procedure: CYSTOSCOPY WITH STENT PLACEMENT;  Surgeon: Nickie Retort, MD;  Location: [facility] ORS;  Service: Urology;  Laterality: Right;  . ERCP N/A 09/12/2015   Procedure: ENDOSCOPIC RETROGRADE CHOLANGIOPANCREATOGRAPHY (ERCP);  Surgeon: Hulen Luster, MD;  Location: [facility] ENDOSCOPY;  Service: Gastroenterology;  Laterality: N/A;  . ERCP N/A 01/02/2016   Procedure: ENDOSCOPIC  RETROGRADE CHOLANGIOPANCREATOGRAPHY (ERCP);  Surgeon: Hulen Luster, MD;  Location: [facility] ENDOSCOPY;  Service: Gastroenterology;  Laterality: N/A;  . EXTERNAL FIXATION WRIST FRACTURE Left   . left arm metal plate    . LEFT HEART CATH AND CORONARY ANGIOGRAPHY Left 11/04/2016   Procedure: Left Heart Cath and Coronary Angiography;  Surgeon: Isaias Cowman, MD;  Location: [place] CV LAB;  Service: Cardiovascular;  Laterality: Left;  . Left shoulder surgery    . Stent times 3  2013   Cardiac  . TOTAL ELBOW REPLACEMENT Left   . VASECTOMY    . VENTRICULOPERITONEAL SHUNT      There were no vitals filed for this visit.  Subjective Assessment - 03/22/18 1704    Subjective   He rates his pain as a 310 upon arrival. No specific questions or concerns currently    Pertinent History  62 yo Male s/p multiple strokes (most recent in Jan 2019) with impaired memory with early onset alzheimer's dementia. Patient has had PT back in 2017 to work on balance and gait with good results. He reports recently (last few months) he has had trouble getting up and moving around. He denies any recent falls. Family reports that per physcian if he didn't come to PT he would be wheelchair bound within 6 months. Patient alone during history intake  which limited our information. He does present to therapy without AD. He reports catching himself often. He is not driving right now.     Limitations  Walking    How long can you sit comfortably?  NA    How long can you stand comfortably?  10-15 min;     How long can you walk comfortably?  about 400-500 feet with increased left hip/back pain;     Diagnostic tests  MRI has been ordered;     Currently in Pain?  Yes    Pain Score  4     Pain Location  Back    Pain Orientation  Lower    Pain Descriptors / Indicators  Aching    Pain Radiating Towards  na    Pain Onset  More than a month ago    Aggravating Factors   standing    Pain Relieving Factors  none    Effect of Pain on  Daily Activities  slows him down    Multiple Pain Sites  No       . Therapeutic Exercise:  TM walking x 3 mins x 2 at .5 miles / hour  Standing exercises with RTB BLE : Marching 2 x 10;;cues to raise up knees higher SLR 2 x 10; cues for performing slowly Abduction 2 x 10;cues for not leaning towards the opposite side Extension 2 x 10; cues not to lean forward with trunk Heel raises 2 x 10; cues to not rock forward Squats x 10 with 5 sec hold, cues to maintain erect position Heel raises x 10 x 2 ; cues to not rock forward Resisted side-steeping RTB 4 lengths x 2;; cues to not rotate trunk towards direction of mobility Sit to stand without UE support 2 x 10; cues for technique and posture correction Step-ups to 6" step x 10 bilateral; cues for getting entire foot on step Quantum leg press 90  # x 20 x 3; cues for not snapping LE in extension and performing slowly  Patient needs demonstration of exercises to perform them correctly and more time to process the information.  . CGA and Min to mod verbal cues used throughout with increased in postural sway and LOB most seen with narrow base of support and while on uneven surfaces. Continues to have balance deficits typical with diagnosis. Patient performs intermediate level exercises without pain behaviors and needs verbal cuing for postural alignment and head positioning                         PT Education - 03/22/18 1705    Education Details  HEP    Person(s) Educated  Patient    Methods  Explanation    Comprehension  Verbalized understanding;Verbal cues required       PT Short Term Goals - 02/27/18 1704      PT SHORT TERM GOAL #1   Title  Patient will increase 10 meter walk test to >1.49ms as to improve gait speed for better community ambulation and to reduce fall risk.    Baseline  02/27/18: 12.9s = 0.78 m/s self selected without assistive device and 11.4s=0.88 m/s fastest speed without assistive device    Time  4     Period  Weeks    Status  On-going    Target Date  02/22/18      PT SHORT TERM GOAL #2   Title  Patient will increase BLE gross strength to 4+/5 as to  improve functional strength for independent gait, increased standing tolerance and increased ADL ability.    Baseline  Bilateral hip flexion: 5/5, hip IR: 4+/5, hip ER: 5/5, hip abduction/aduction 4-/5, pt unable to lay prone to test hip extension, knee extension: 5/5, knee flexion: 4+/5, ankle dorsiflexion: 4+/5;     Time  4    Period  Weeks    Status  On-going    Target Date  02/22/18        PT Long Term Goals - 03/14/18 0907      PT LONG TERM GOAL #1   Title  Patient will increase Berg Balance score by > 6 points to demonstrate decreased fall risk during functional activities.    Baseline  02/27/18: 44/56    Time  6    Period  Weeks    Status  Achieved      PT LONG TERM GOAL #2   Title  Patient will be adherent to HEP at least 3x a week to improve functional strength and balance for better safety at home.    Baseline  02/27/18: "every other day"    Time  6    Period  Weeks    Status  Achieved      PT LONG TERM GOAL #3   Title  Patient will be accurate with finger to nose pointing x5 reps to improve fine motor coordination for grasping and using objects in UE when in community such as out to dinner.     Baseline  02/27/18: BUE coordination remains dysmetric, apraxia as well    Time  8    Period  Weeks    Status  On-going    Target Date  05/09/18      PT LONG TERM GOAL #4   Title  Patient will be require no assist with ascend/descend 3 steps using Least restrictive assistive device.    Baseline  02/27/18: Pt requires UE support to ascend/descend steps safely    Time  6    Period  Weeks    Status  On-going    Target Date  05/09/18      PT LONG TERM GOAL #5   Title   Patient will demonstrate an improved Berg Balance Score of > 50/56 as to demonstrate improved balance with ADLs such as sitting/standing and transfer balance and  reduced fall risk.     Baseline  02/27/18: 44/56    Time  6    Period  Weeks    Status  Partially Met    Target Date  05/09/18            Plan - 03/22/18 1705    Clinical Impression Statement  Dynamic and static balance interventions continued today, with a focus on unilateral LE stability.  Pt tolerated all exercises well.  Intermediate dynamic standing balance tasks were progressed with min needed for reaching outside of her base of support.  Pt would continue to benefit from skilled therapy services in order to address balance deficits in order to decrease fall risk and improve mobility.    Rehab Potential  Fair    Clinical Impairments Affecting Rehab Potential  concerned about progression of early onset alzheimers and how that affects mobility;     PT Frequency  2x / week    PT Duration  8 weeks    PT Treatment/Interventions  ADLs/Self Care Home Management;Cryotherapy;Moist Heat;Therapeutic exercise;Therapeutic activities;Functional mobility training;Stair training;Gait training;DME Instruction;Balance training;Neuromuscular re-education;Patient/family education;Energy conservation    PT Next Visit Plan  continue with balance and strengthening program    PT Home Exercise Plan  no updates this date.     Consulted and Agree with Plan of Care  Patient       Patient will benefit from skilled therapeutic intervention in order to improve the following deficits and impairments:  Abnormal gait, Decreased endurance, Decreased knowledge of precautions, Decreased activity tolerance, Decreased strength, Pain, Difficulty walking, Decreased mobility, Decreased balance, Decreased safety awareness  Visit Diagnosis: Difficulty in walking, not elsewhere classified  Unsteadiness on feet  Muscle weakness (generalized)  Cognitive communication deficit     Problem List Patient Active Problem List   Diagnosis Date Noted  . Hypersalivation 01/30/2018  . Drooling 01/30/2018  . Alzheimer's  dementia 05/01/2017  . Cognitive decline 10/20/2016  . Chest pain, rule out acute myocardial infarction 04/05/2016  . Confusion 10/18/2015  . Constipation 08/23/2015  . Uncontrolled type 2 diabetes mellitus with hyperglycemia, with long-term current use of insulin ([place]) 08/23/2015  . Essential hypertension 08/23/2015  . Right lower lobe pneumonia ([place]) 08/23/2015  . Abdominal pain 08/21/2015  . Altered mental status 02/19/2015    Alanson Puls, PT DPT 03/22/2018, 5:07 PM  [place] MAIN [facility] SERVICES [address] [place], [place], [address] Phone: [phone]   Fax:  [phone]  Name: Maurice Patterson MRN: [id number] Date of Birth: 1955/10/08

## 2018-03-28 ENCOUNTER — Ambulatory Visit: Payer: PPO | Attending: Family Medicine

## 2018-03-28 DIAGNOSIS — M6281 Muscle weakness (generalized): Secondary | ICD-10-CM | POA: Insufficient documentation

## 2018-03-28 DIAGNOSIS — R41841 Cognitive communication deficit: Secondary | ICD-10-CM | POA: Diagnosis not present

## 2018-03-28 DIAGNOSIS — R2681 Unsteadiness on feet: Secondary | ICD-10-CM | POA: Diagnosis not present

## 2018-03-28 DIAGNOSIS — R262 Difficulty in walking, not elsewhere classified: Secondary | ICD-10-CM | POA: Diagnosis not present

## 2018-03-28 NOTE — Therapy (Signed)
[place] MAIN [facility] SERVICES [address] [place], [place], [address] Phone: [phone]   Fax:  [phone]  Physical Therapy Treatment  Patient Details  Name: Maurice Patterson MRN: [id number] Date of Birth: 1955/12/21 Referring Provider: [name], MD   Encounter Date: 03/28/2018  PT End of Session - 03/28/18 1542    Visit Number  14    Number of Visits  30    Date for PT Re-Evaluation  05/03/18    PT Start Time  1525    PT Stop Time  1603    PT Time Calculation (min)  38 min    Equipment Utilized During Treatment  Gait belt    Activity Tolerance  Patient limited by fatigue;Patient limited by pain;Other (comment) limited by dementia    Behavior During Therapy  [facility] for tasks assessed/performed       Past Medical History:  Diagnosis Date  . Acute MI ([place])   . Arthritis   . Back pain   . CAD (coronary artery disease)   . CHF (congestive heart failure) ([place])   . Chronic kidney disease   . Degenerative lumbar disc   . Dementia   . Diabetes mellitus without complication ([place])   . GERD (gastroesophageal reflux disease)   . Hernia of abdominal cavity   . Hyperlipemia   . Hypertension   . Malignant intraductal papillary mucinous tumor of pancreas ([place])   . Memory loss   . Pancreatitis   . Seizures ([place])    staring spells  . Shingles   . Stroke ([place]) 08/2017  . TIA (transient ischemic attack)     Past Surgical History:  Procedure Laterality Date  . CARDIAC CATHETERIZATION    . CORONARY ANGIOPLASTY    . CYSTOSCOPY WITH STENT PLACEMENT Right 05/13/2017   Procedure: CYSTOSCOPY WITH STENT PLACEMENT;  Surgeon: Nickie Retort, MD;  Location: [facility] ORS;  Service: Urology;  Laterality: Right;  . ERCP N/A 09/12/2015   Procedure: ENDOSCOPIC RETROGRADE CHOLANGIOPANCREATOGRAPHY (ERCP);  Surgeon: Hulen Luster, MD;  Location: [facility] ENDOSCOPY;  Service: Gastroenterology;  Laterality: N/A;  . ERCP N/A 01/02/2016   Procedure: ENDOSCOPIC  RETROGRADE CHOLANGIOPANCREATOGRAPHY (ERCP);  Surgeon: Hulen Luster, MD;  Location: [facility] ENDOSCOPY;  Service: Gastroenterology;  Laterality: N/A;  . EXTERNAL FIXATION WRIST FRACTURE Left   . left arm metal plate    . LEFT HEART CATH AND CORONARY ANGIOGRAPHY Left 11/04/2016   Procedure: Left Heart Cath and Coronary Angiography;  Surgeon: Isaias Cowman, MD;  Location: [place] CV LAB;  Service: Cardiovascular;  Laterality: Left;  . Left shoulder surgery    . Stent times 3  2013   Cardiac  . TOTAL ELBOW REPLACEMENT Left   . VASECTOMY    . VENTRICULOPERITONEAL SHUNT      There were no vitals filed for this visit.  Subjective Assessment - 03/28/18 1537    Subjective  Patient reports no pain upon arrival however has some pain with exercise. Patient reports no falls or LOB since last session.     Pertinent History  62 yo Male s/p multiple strokes (most recent in Jan 2019) with impaired memory with early onset alzheimer's dementia. Patient has had PT back in 2017 to work on balance and gait with good results. He reports recently (last few months) he has had trouble getting up and moving around. He denies any recent falls. Family reports that per physcian if he didn't come to PT he would be wheelchair bound within 6  months. Patient alone during history intake which limited our information. He does present to therapy without AD. He reports catching himself often. He is not driving right now.     Limitations  Walking    How long can you sit comfortably?  NA    How long can you stand comfortably?  10-15 min;     How long can you walk comfortably?  about 400-500 feet with increased left hip/back pain;     Diagnostic tests  MRI has been ordered;     Currently in Pain?  Yes    Pain Score  5     Pain Location  Back    Pain Orientation  Lower    Pain Descriptors / Indicators  Aching    Pain Type  Chronic pain    Pain Onset  More than a month ago    Pain Frequency  Constant       Standing  exercises with RTB BLE : Marching  x 10;;cues to raise up knees higher  3lb ankle weights:  SLR 2 x 10; cues for performing slowly and keep knee straight until hit PT hand Abduction 2 x 10;cues for not leaning towards the opposite side; keep toes straight  Extension 2 x 10; cues not to lean forward with trunk  Static stand football pass 20x in // bars for reaching inside and outside BOS   Step over and back orange hurdle 10x each leg SUE support   Heel raises x 10 x 2 ; cues to not rock forward       Patient needs demonstration of exercises to perform them correctly and more time to process the information.  . CGA and Min to mod verbal cues used throughout with increased in postural sway and LOB most seen with narrow base of support and while on uneven surfaces. Continues to have balance deficits typical with diagnosis. Patient performs intermediate level exercises without pain behaviors and needs verbal cuing for postural alignment and head positioning                            PT Education - 03/28/18 1538    Education Details  exercise technique and stability     Person(s) Educated  Patient    Methods  Explanation;Demonstration    Comprehension  Verbalized understanding;Returned demonstration       PT Short Term Goals - 02/27/18 1704      PT SHORT TERM GOAL #1   Title  Patient will increase 10 meter walk test to >1.46ms as to improve gait speed for better community ambulation and to reduce fall risk.    Baseline  02/27/18: 12.9s = 0.78 m/s self selected without assistive device and 11.4s=0.88 m/s fastest speed without assistive device    Time  4    Period  Weeks    Status  On-going    Target Date  02/22/18      PT SHORT TERM GOAL #2   Title  Patient will increase BLE gross strength to 4+/5 as to improve functional strength for independent gait, increased standing tolerance and increased ADL ability.    Baseline  Bilateral hip flexion: 5/5, hip IR:  4+/5, hip ER: 5/5, hip abduction/aduction 4-/5, pt unable to lay prone to test hip extension, knee extension: 5/5, knee flexion: 4+/5, ankle dorsiflexion: 4+/5;     Time  4    Period  Weeks    Status  On-going  Target Date  02/22/18        PT Long Term Goals - 03/14/18 0907      PT LONG TERM GOAL #1   Title  Patient will increase Berg Balance score by > 6 points to demonstrate decreased fall risk during functional activities.    Baseline  02/27/18: 44/56    Time  6    Period  Weeks    Status  Achieved      PT LONG TERM GOAL #2   Title  Patient will be adherent to HEP at least 3x a week to improve functional strength and balance for better safety at home.    Baseline  02/27/18: "every other day"    Time  6    Period  Weeks    Status  Achieved      PT LONG TERM GOAL #3   Title  Patient will be accurate with finger to nose pointing x5 reps to improve fine motor coordination for grasping and using objects in UE when in community such as out to dinner.     Baseline  02/27/18: BUE coordination remains dysmetric, apraxia as well    Time  8    Period  Weeks    Status  On-going    Target Date  05/09/18      PT LONG TERM GOAL #4   Title  Patient will be require no assist with ascend/descend 3 steps using Least restrictive assistive device.    Baseline  02/27/18: Pt requires UE support to ascend/descend steps safely    Time  6    Period  Weeks    Status  On-going    Target Date  05/09/18      PT LONG TERM GOAL #5   Title   Patient will demonstrate an improved Berg Balance Score of > 50/56 as to demonstrate improved balance with ADLs such as sitting/standing and transfer balance and reduced fall risk.     Baseline  02/27/18: 44/56    Time  6    Period  Weeks    Status  Partially Met    Target Date  05/09/18            Plan - 03/28/18 1604    Clinical Impression Statement  Patient requires tactile and frequent cueing for task orientation and sequencing. Patient has difficulty with  coordinating alternating L and R leg with dysmetria noted. Pt would continue to benefit from skilled therapy services in order to address balance deficits in order to decrease fall risk and improve mobility.    Rehab Potential  Fair    Clinical Impairments Affecting Rehab Potential  concerned about progression of early onset alzheimers and how that affects mobility;     PT Frequency  2x / week    PT Duration  8 weeks    PT Treatment/Interventions  ADLs/Self Care Home Management;Cryotherapy;Moist Heat;Therapeutic exercise;Therapeutic activities;Functional mobility training;Stair training;Gait training;DME Instruction;Balance training;Neuromuscular re-education;Patient/family education;Energy conservation    PT Next Visit Plan  continue with balance and strengthening program    PT Home Exercise Plan  no updates this date.     Consulted and Agree with Plan of Care  Patient       Patient will benefit from skilled therapeutic intervention in order to improve the following deficits and impairments:  Abnormal gait, Decreased endurance, Decreased knowledge of precautions, Decreased activity tolerance, Decreased strength, Pain, Difficulty walking, Decreased mobility, Decreased balance, Decreased safety awareness  Visit Diagnosis: Difficulty in walking, not elsewhere classified  Unsteadiness on feet  Muscle weakness (generalized)     Problem List Patient Active Problem List   Diagnosis Date Noted  . Hypersalivation 01/30/2018  . Drooling 01/30/2018  . Alzheimer's dementia 05/01/2017  . Cognitive decline 10/20/2016  . Chest pain, rule out acute myocardial infarction 04/05/2016  . Confusion 10/18/2015  . Constipation 08/23/2015  . Uncontrolled type 2 diabetes mellitus with hyperglycemia, with long-term current use of insulin ([place]) 08/23/2015  . Essential hypertension 08/23/2015  . Right lower lobe pneumonia ([place]) 08/23/2015  . Abdominal pain 08/21/2015  . Altered mental status 02/19/2015    Janna Arch, PT, DPT   03/28/2018, 4:05 PM  [place] MAIN [facility] SERVICES [address] [place], [place], [address] Phone: [phone]   Fax:  [phone]  Name: TAEQUAN STOCKHAUSEN MRN: [id number] Date of Birth: November 23, 1955

## 2018-03-30 ENCOUNTER — Ambulatory Visit: Payer: PPO | Admitting: Physical Therapy

## 2018-03-30 ENCOUNTER — Encounter: Payer: Self-pay | Admitting: Physical Therapy

## 2018-03-30 DIAGNOSIS — R262 Difficulty in walking, not elsewhere classified: Secondary | ICD-10-CM

## 2018-03-30 DIAGNOSIS — R41841 Cognitive communication deficit: Secondary | ICD-10-CM

## 2018-03-30 DIAGNOSIS — R2681 Unsteadiness on feet: Secondary | ICD-10-CM

## 2018-03-30 DIAGNOSIS — M6281 Muscle weakness (generalized): Secondary | ICD-10-CM

## 2018-03-30 NOTE — Therapy (Signed)
[place] MAIN [facility] SERVICES [address] [place], [place], [address] Phone: [phone]   Fax:  [phone]  Physical Therapy Treatment  Patient Details  Name: JAQUAY POSTHUMUS MRN: [id number] Date of Birth: 10/16/1955 Referring Provider: [name], MD   Encounter Date: 03/30/2018  PT End of Session - 03/30/18 1528    Visit Number  15    Number of Visits  30    Date for PT Re-Evaluation  05/03/18    PT Start Time  0230    PT Stop Time  0315    PT Time Calculation (min)  45 min    Equipment Utilized During Treatment  Gait belt    Activity Tolerance  Patient limited by fatigue;Patient limited by pain;Other (comment)   limited by dementia   Behavior During Therapy  [facility] for tasks assessed/performed       Past Medical History:  Diagnosis Date  . Acute MI ([place])   . Arthritis   . Back pain   . CAD (coronary artery disease)   . CHF (congestive heart failure) ([place])   . Chronic kidney disease   . Degenerative lumbar disc   . Dementia   . Diabetes mellitus without complication ([place])   . GERD (gastroesophageal reflux disease)   . Hernia of abdominal cavity   . Hyperlipemia   . Hypertension   . Malignant intraductal papillary mucinous tumor of pancreas ([place])   . Memory loss   . Pancreatitis   . Seizures ([place])    staring spells  . Shingles   . Stroke ([place]) 08/2017  . TIA (transient ischemic attack)     Past Surgical History:  Procedure Laterality Date  . CARDIAC CATHETERIZATION    . CORONARY ANGIOPLASTY    . CYSTOSCOPY WITH STENT PLACEMENT Right 05/13/2017   Procedure: CYSTOSCOPY WITH STENT PLACEMENT;  Surgeon: Nickie Retort, MD;  Location: [facility] ORS;  Service: Urology;  Laterality: Right;  . ERCP N/A 09/12/2015   Procedure: ENDOSCOPIC RETROGRADE CHOLANGIOPANCREATOGRAPHY (ERCP);  Surgeon: Hulen Luster, MD;  Location: [facility] ENDOSCOPY;  Service: Gastroenterology;  Laterality: N/A;  . ERCP N/A 01/02/2016   Procedure: ENDOSCOPIC  RETROGRADE CHOLANGIOPANCREATOGRAPHY (ERCP);  Surgeon: Hulen Luster, MD;  Location: [facility] ENDOSCOPY;  Service: Gastroenterology;  Laterality: N/A;  . EXTERNAL FIXATION WRIST FRACTURE Left   . left arm metal plate    . LEFT HEART CATH AND CORONARY ANGIOGRAPHY Left 11/04/2016   Procedure: Left Heart Cath and Coronary Angiography;  Surgeon: Isaias Cowman, MD;  Location: [place] CV LAB;  Service: Cardiovascular;  Laterality: Left;  . Left shoulder surgery    . Stent times 3  2013   Cardiac  . TOTAL ELBOW REPLACEMENT Left   . VASECTOMY    . VENTRICULOPERITONEAL SHUNT      There were no vitals filed for this visit.  Subjective Assessment - 03/30/18 1527    Subjective  Patient reports no pain upon arrival however has some pain with exercise. Patient reports no falls or LOB since last session.     Pertinent History  62 yo Male s/p multiple strokes (most recent in Jan 2019) with impaired memory with early onset alzheimer's dementia. Patient has had PT back in 2017 to work on balance and gait with good results. He reports recently (last few months) he has had trouble getting up and moving around. He denies any recent falls. Family reports that per physcian if he didn't come to PT he would be wheelchair bound within  6 months. Patient alone during history intake which limited our information. He does present to therapy without AD. He reports catching himself often. He is not driving right now.     Limitations  Walking    How long can you sit comfortably?  NA    How long can you stand comfortably?  10-15 min;     How long can you walk comfortably?  about 400-500 feet with increased left hip/back pain;     Diagnostic tests  MRI has been ordered;     Currently in Pain?  No/denies    Pain Score  0-No pain    Pain Onset  More than a month ago       Treatment TM walking . 6 miles / hour UE assisted x 5 mins  Standing exercises with RTB BLE : Marching  x 10;;cues to raise up knees higher SLR 2 x  10; cues for performing slowly and keep knee straight until hit PT hand Abduction 2 x 10;cues for not leaning towards the opposite side; keep toes straight  Extension 2 x 10; cues not to lean forward with trunk Step over and back 1/2 foam 10  x each leg SUE support Heel raises x 10 x 2 ; cues to not rock forward                        PT Education - 03/30/18 1527    Education Details  HEP    Person(s) Educated  Patient    Methods  Explanation;Demonstration;Tactile cues;Verbal cues    Comprehension  Verbalized understanding;Returned demonstration;Verbal cues required;Tactile cues required       PT Short Term Goals - 02/27/18 1704      PT SHORT TERM GOAL #1   Title  Patient will increase 10 meter walk test to >1.39ms as to improve gait speed for better community ambulation and to reduce fall risk.    Baseline  02/27/18: 12.9s = 0.78 m/s self selected without assistive device and 11.4s=0.88 m/s fastest speed without assistive device    Time  4    Period  Weeks    Status  On-going    Target Date  02/22/18      PT SHORT TERM GOAL #2   Title  Patient will increase BLE gross strength to 4+/5 as to improve functional strength for independent gait, increased standing tolerance and increased ADL ability.    Baseline  Bilateral hip flexion: 5/5, hip IR: 4+/5, hip ER: 5/5, hip abduction/aduction 4-/5, pt unable to lay prone to test hip extension, knee extension: 5/5, knee flexion: 4+/5, ankle dorsiflexion: 4+/5;     Time  4    Period  Weeks    Status  On-going    Target Date  02/22/18        PT Long Term Goals - 03/14/18 0907      PT LONG TERM GOAL #1   Title  Patient will increase Berg Balance score by > 6 points to demonstrate decreased fall risk during functional activities.    Baseline  02/27/18: 44/56    Time  6    Period  Weeks    Status  Achieved      PT LONG TERM GOAL #2   Title  Patient will be adherent to HEP at least 3x a week to improve functional  strength and balance for better safety at home.    Baseline  02/27/18: "every other day"    Time  6    Period  Weeks    Status  Achieved      PT LONG TERM GOAL #3   Title  Patient will be accurate with finger to nose pointing x5 reps to improve fine motor coordination for grasping and using objects in UE when in community such as out to dinner.     Baseline  02/27/18: BUE coordination remains dysmetric, apraxia as well    Time  8    Period  Weeks    Status  On-going    Target Date  05/09/18      PT LONG TERM GOAL #4   Title  Patient will be require no assist with ascend/descend 3 steps using Least restrictive assistive device.    Baseline  02/27/18: Pt requires UE support to ascend/descend steps safely    Time  6    Period  Weeks    Status  On-going    Target Date  05/09/18      PT LONG TERM GOAL #5   Title   Patient will demonstrate an improved Berg Balance Score of > 50/56 as to demonstrate improved balance with ADLs such as sitting/standing and transfer balance and reduced fall risk.     Baseline  02/27/18: 44/56    Time  6    Period  Weeks    Status  Partially Met    Target Date  05/09/18            Plan - 03/30/18 1530    Clinical Impression Statement  Patient   instructed in gait training on level surfaces on TM with rest periods standing. Patient is able to perform intermediate LE exercises in supine and standing open and closed chain.  Patient performs dynamic standing balance training with even surfaces with max verbal cuing and CGA. Patient will continue to benefit from skilled PT to improve strength, balance and gait. Patient continues to have LE weakness   and decreased static and dynamic standing balance.    Rehab Potential  Fair    Clinical Impairments Affecting Rehab Potential  concerned about progression of early onset alzheimers and how that affects mobility;     PT Frequency  2x / week    PT Duration  8 weeks    PT Treatment/Interventions  ADLs/Self Care Home  Management;Cryotherapy;Moist Heat;Therapeutic exercise;Therapeutic activities;Functional mobility training;Stair training;Gait training;DME Instruction;Balance training;Neuromuscular re-education;Patient/family education;Energy conservation    PT Next Visit Plan  continue with balance and strengthening program    PT Home Exercise Plan  no updates this date.     Consulted and Agree with Plan of Care  Patient       Patient will benefit from skilled therapeutic intervention in order to improve the following deficits and impairments:  Abnormal gait, Decreased endurance, Decreased knowledge of precautions, Decreased activity tolerance, Decreased strength, Pain, Difficulty walking, Decreased mobility, Decreased balance, Decreased safety awareness  Visit Diagnosis: Difficulty in walking, not elsewhere classified  Unsteadiness on feet  Muscle weakness (generalized)  Cognitive communication deficit     Problem List Patient Active Problem List   Diagnosis Date Noted  . Hypersalivation 01/30/2018  . Drooling 01/30/2018  . Alzheimer's dementia 05/01/2017  . Cognitive decline 10/20/2016  . Chest pain, rule out acute myocardial infarction 04/05/2016  . Confusion 10/18/2015  . Constipation 08/23/2015  . Uncontrolled type 2 diabetes mellitus with hyperglycemia, with long-term current use of insulin ([place]) 08/23/2015  . Essential hypertension 08/23/2015  . Right lower lobe pneumonia ([place]) 08/23/2015  . Abdominal  pain 08/21/2015  . Altered mental status 02/19/2015    Alanson Puls, PT DPT 03/30/2018, 3:31 PM  [place] MAIN [facility] SERVICES [address] [place], [place], [address] Phone: [phone]   Fax:  [phone]  Name: AMAIR SHROUT MRN: [id number] Date of Birth: 1956-07-31

## 2018-04-04 ENCOUNTER — Encounter: Payer: Self-pay | Admitting: Physical Therapy

## 2018-04-04 ENCOUNTER — Ambulatory Visit: Payer: PPO | Admitting: Physical Therapy

## 2018-04-04 DIAGNOSIS — R262 Difficulty in walking, not elsewhere classified: Secondary | ICD-10-CM | POA: Diagnosis not present

## 2018-04-04 DIAGNOSIS — R2681 Unsteadiness on feet: Secondary | ICD-10-CM

## 2018-04-04 DIAGNOSIS — M6281 Muscle weakness (generalized): Secondary | ICD-10-CM

## 2018-04-04 NOTE — Therapy (Signed)
[place] MAIN [facility] SERVICES [address] [place], [place], [address] Phone: [phone]   Fax:  [phone]  Physical Therapy Treatment  Patient Details  Name: Maurice Patterson MRN: [id number] Date of Birth: [birth year] Referring Provider: [name], MD   Encounter Date: 04/04/2018  PT End of Session - 04/04/18 1701    Visit Number  16    Number of Visits  30    Date for PT Re-Evaluation  05/03/18    PT Start Time  0445    PT Stop Time  0525    PT Time Calculation (min)  40 min    Equipment Utilized During Treatment  Gait belt    Activity Tolerance  Patient limited by fatigue;Patient limited by pain;Other (comment)   limited by dementia   Behavior During Therapy  [facility] for tasks assessed/performed       Past Medical History:  Diagnosis Date  . Acute MI ([place])   . Arthritis   . Back pain   . CAD (coronary artery disease)   . CHF (congestive heart failure) ([place])   . Chronic kidney disease   . Degenerative lumbar disc   . Dementia   . Diabetes mellitus without complication ([place])   . GERD (gastroesophageal reflux disease)   . Hernia of abdominal cavity   . Hyperlipemia   . Hypertension   . Malignant intraductal papillary mucinous tumor of pancreas ([place])   . Memory loss   . Pancreatitis   . Seizures ([place])    staring spells  . Shingles   . Stroke ([place]) 08/2017  . TIA (transient ischemic attack)     Past Surgical History:  Procedure Laterality Date  . CARDIAC CATHETERIZATION    . CORONARY ANGIOPLASTY    . CYSTOSCOPY WITH STENT PLACEMENT Right 05/13/2017   Procedure: CYSTOSCOPY WITH STENT PLACEMENT;  Surgeon: Nickie Retort, MD;  Location: [facility] ORS;  Service: Urology;  Laterality: Right;  . ERCP N/A 09/12/2015   Procedure: ENDOSCOPIC RETROGRADE CHOLANGIOPANCREATOGRAPHY (ERCP);  Surgeon: Hulen Luster, MD;  Location: [facility] ENDOSCOPY;  Service: Gastroenterology;  Laterality: N/A;  . ERCP N/A 01/02/2016   Procedure:  ENDOSCOPIC RETROGRADE CHOLANGIOPANCREATOGRAPHY (ERCP);  Surgeon: Hulen Luster, MD;  Location: [facility] ENDOSCOPY;  Service: Gastroenterology;  Laterality: N/A;  . EXTERNAL FIXATION WRIST FRACTURE Left   . left arm metal plate    . LEFT HEART CATH AND CORONARY ANGIOGRAPHY Left 11/04/2016   Procedure: Left Heart Cath and Coronary Angiography;  Surgeon: Isaias Cowman, MD;  Location: [place] CV LAB;  Service: Cardiovascular;  Laterality: Left;  . Left shoulder surgery    . Stent times 3  2013   Cardiac  . TOTAL ELBOW REPLACEMENT Left   . VASECTOMY    . VENTRICULOPERITONEAL SHUNT      There were no vitals filed for this visit.  Subjective Assessment - 04/04/18 1700    Subjective  Patient reports no pain upon arrival however has some pain with exercise. Patient reports no falls or LOB since last session.     Pertinent History  62 yo Male s/p multiple strokes (most recent in Jan 2019) with impaired memory with early onset alzheimer's dementia. Patient has had PT back in 2017 to work on balance and gait with good results. He reports recently (last few months) he has had trouble getting up and moving around. He denies any recent falls. Family reports that per physcian if he didn't come to PT he would be wheelchair bound within  6 months. Patient alone during history intake which limited our information. He does present to therapy without AD. He reports catching himself often. He is not driving right now.     Limitations  Walking    How long can you sit comfortably?  NA    How long can you stand comfortably?  10-15 min;     How long can you walk comfortably?  about 400-500 feet with increased left hip/back pain;     Diagnostic tests  MRI has been ordered;     Currently in Pain?  No/denies    Pain Score  0-No pain    Pain Onset  More than a month ago         Therapeutic Exercise:   Nu-step x 5 mins   Marching 2 x 10;;cues to raise up knees higher  SLR 2 x 10; cues for performing  slowly  Abduction 2 x 10;cues for not leaning towards the opposite side  Extension 2 x 10; cues not to lean forward with trunk  Knee flexion 2 x 10; cues to bend knee slowly  Heel raises 2 x 10; cues to not rock forward  Eccentric step downs x 10 BLE; cues to not put too much weight bearing on UE's  Resisted side-steeping RTB 4 lengths x 2;; cues to not rotate trunk towards direction of mobility  Standing mini squats 2 x 10   Sit to stand without UE support 2 x 10; cues for technique and posture correction  Step-ups to 6" step x 10 bilateral; cues for getting entire foot on step  Quantum leg press 100 # x 20 x 3; cues for not snapping LE in extension and performing slowly   Patient needs demonstration for correct exercise form and technique.                      PT Education - 04/04/18 1701    Education Details  HEP    Person(s) Educated  Patient    Methods  Explanation;Demonstration;Tactile cues;Verbal cues    Comprehension  Verbalized understanding;Returned demonstration       PT Short Term Goals - 02/27/18 1704      PT SHORT TERM GOAL #1   Title  Patient will increase 10 meter walk test to >1.5ms as to improve gait speed for better community ambulation and to reduce fall risk.    Baseline  02/27/18: 12.9s = 0.78 m/s self selected without assistive device and 11.4s=0.88 m/s fastest speed without assistive device    Time  4    Period  Weeks    Status  On-going    Target Date  02/22/18      PT SHORT TERM GOAL #2   Title  Patient will increase BLE gross strength to 4+/5 as to improve functional strength for independent gait, increased standing tolerance and increased ADL ability.    Baseline  Bilateral hip flexion: 5/5, hip IR: 4+/5, hip ER: 5/5, hip abduction/aduction 4-/5, pt unable to lay prone to test hip extension, knee extension: 5/5, knee flexion: 4+/5, ankle dorsiflexion: 4+/5;     Time  4    Period  Weeks    Status  On-going    Target Date   02/22/18        PT Long Term Goals - 03/14/18 0907      PT LONG TERM GOAL #1   Title  Patient will increase Berg Balance score by > 6 points to demonstrate decreased fall  risk during functional activities.    Baseline  02/27/18: 44/56    Time  6    Period  Weeks    Status  Achieved      PT LONG TERM GOAL #2   Title  Patient will be adherent to HEP at least 3x a week to improve functional strength and balance for better safety at home.    Baseline  02/27/18: "every other day"    Time  6    Period  Weeks    Status  Achieved      PT LONG TERM GOAL #3   Title  Patient will be accurate with finger to nose pointing x5 reps to improve fine motor coordination for grasping and using objects in UE when in community such as out to dinner.     Baseline  02/27/18: BUE coordination remains dysmetric, apraxia as well    Time  8    Period  Weeks    Status  On-going    Target Date  05/09/18      PT LONG TERM GOAL #4   Title  Patient will be require no assist with ascend/descend 3 steps using Least restrictive assistive device.    Baseline  02/27/18: Pt requires UE support to ascend/descend steps safely    Time  6    Period  Weeks    Status  On-going    Target Date  05/09/18      PT LONG TERM GOAL #5   Title   Patient will demonstrate an improved Berg Balance Score of > 50/56 as to demonstrate improved balance with ADLs such as sitting/standing and transfer balance and reduced fall risk.     Baseline  02/27/18: 44/56    Time  6    Period  Weeks    Status  Partially Met    Target Date  05/09/18            Plan - 04/04/18 1702    Clinical Impression Statement  Pt presents with unsteadiness on uneven surfaces and fatigues with therapeutic exercises. Patient needs assist with instruction for balance and for correct exercise form and technique. . Patient demonstrates difficulty with side stepping  and navigating small spaces with decreased base of support and increased challenges for LE.  Patient  tolerated all interventions well this date and will benefit from continued skilled PT interventions to improve strength and balance and decrease risk of falling    Rehab Potential  Fair    Clinical Impairments Affecting Rehab Potential  concerned about progression of early onset alzheimers and how that affects mobility;     PT Frequency  2x / week    PT Duration  8 weeks    PT Treatment/Interventions  ADLs/Self Care Home Management;Cryotherapy;Moist Heat;Therapeutic exercise;Therapeutic activities;Functional mobility training;Stair training;Gait training;DME Instruction;Balance training;Neuromuscular re-education;Patient/family education;Energy conservation    PT Next Visit Plan  continue with balance and strengthening program    PT Home Exercise Plan  no updates this date.     Consulted and Agree with Plan of Care  Patient       Patient will benefit from skilled therapeutic intervention in order to improve the following deficits and impairments:  Abnormal gait, Decreased endurance, Decreased knowledge of precautions, Decreased activity tolerance, Decreased strength, Pain, Difficulty walking, Decreased mobility, Decreased balance, Decreased safety awareness  Visit Diagnosis: Difficulty in walking, not elsewhere classified  Unsteadiness on feet  Muscle weakness (generalized)     Problem List Patient Active Problem List  Diagnosis Date Noted  . Hypersalivation 01/30/2018  . Drooling 01/30/2018  . Alzheimer's dementia 05/01/2017  . Cognitive decline 10/20/2016  . Chest pain, rule out acute myocardial infarction 04/05/2016  . Confusion 10/18/2015  . Constipation 08/23/2015  . Uncontrolled type 2 diabetes mellitus with hyperglycemia, with long-term current use of insulin ([place]) 08/23/2015  . Essential hypertension 08/23/2015  . Right lower lobe pneumonia ([place]) 08/23/2015  . Abdominal pain 08/21/2015  . Altered mental status 02/19/2015    Alanson Puls, PT DPT 04/04/2018,  5:05 PM  [place] MAIN [facility] SERVICES [address] [place], [place], [address] Phone: [phone]   Fax:  [phone]  Name: Maurice Patterson MRN: [id number] Date of Birth: 08/02/1956

## 2018-04-06 ENCOUNTER — Ambulatory Visit: Payer: PPO | Admitting: Physical Therapy

## 2018-04-06 ENCOUNTER — Encounter: Payer: Self-pay | Admitting: Physical Therapy

## 2018-04-06 DIAGNOSIS — M6281 Muscle weakness (generalized): Secondary | ICD-10-CM

## 2018-04-06 DIAGNOSIS — R41841 Cognitive communication deficit: Secondary | ICD-10-CM

## 2018-04-06 DIAGNOSIS — R262 Difficulty in walking, not elsewhere classified: Secondary | ICD-10-CM | POA: Diagnosis not present

## 2018-04-06 DIAGNOSIS — R2681 Unsteadiness on feet: Secondary | ICD-10-CM

## 2018-04-06 NOTE — Therapy (Signed)
[place] MAIN [facility] SERVICES [address] [place], [place], [address] Phone: [phone]   Fax:  [phone]  Physical Therapy Treatment  Patient Details  Name: Maurice Patterson MRN: [id number] Date of Birth: April 02, 1956 Referring Provider: [name], MD   Encounter Date: 04/06/2018  PT End of Session - 04/06/18 1706    Visit Number  17    Number of Visits  30    Date for PT Re-Evaluation  05/03/18    PT Start Time  0500    PT Stop Time  0545    PT Time Calculation (min)  45 min    Equipment Utilized During Treatment  Gait belt    Activity Tolerance  Patient limited by fatigue;Patient limited by pain;Other (comment)   limited by dementia   Behavior During Therapy  [facility] for tasks assessed/performed       Past Medical History:  Diagnosis Date  . Acute MI ([place])   . Arthritis   . Back pain   . CAD (coronary artery disease)   . CHF (congestive heart failure) ([place])   . Chronic kidney disease   . Degenerative lumbar disc   . Dementia   . Diabetes mellitus without complication ([place])   . GERD (gastroesophageal reflux disease)   . Hernia of abdominal cavity   . Hyperlipemia   . Hypertension   . Malignant intraductal papillary mucinous tumor of pancreas ([place])   . Memory loss   . Pancreatitis   . Seizures ([place])    staring spells  . Shingles   . Stroke ([place]) 08/2017  . TIA (transient ischemic attack)     Past Surgical History:  Procedure Laterality Date  . CARDIAC CATHETERIZATION    . CORONARY ANGIOPLASTY    . CYSTOSCOPY WITH STENT PLACEMENT Right 05/13/2017   Procedure: CYSTOSCOPY WITH STENT PLACEMENT;  Surgeon: Nickie Retort, MD;  Location: [facility] ORS;  Service: Urology;  Laterality: Right;  . ERCP N/A 09/12/2015   Procedure: ENDOSCOPIC RETROGRADE CHOLANGIOPANCREATOGRAPHY (ERCP);  Surgeon: Hulen Luster, MD;  Location: [facility] ENDOSCOPY;  Service: Gastroenterology;  Laterality: N/A;  . ERCP N/A 01/02/2016   Procedure:  ENDOSCOPIC RETROGRADE CHOLANGIOPANCREATOGRAPHY (ERCP);  Surgeon: Hulen Luster, MD;  Location: [facility] ENDOSCOPY;  Service: Gastroenterology;  Laterality: N/A;  . EXTERNAL FIXATION WRIST FRACTURE Left   . left arm metal plate    . LEFT HEART CATH AND CORONARY ANGIOGRAPHY Left 11/04/2016   Procedure: Left Heart Cath and Coronary Angiography;  Surgeon: Isaias Cowman, MD;  Location: [place] CV LAB;  Service: Cardiovascular;  Laterality: Left;  . Left shoulder surgery    . Stent times 3  2013   Cardiac  . TOTAL ELBOW REPLACEMENT Left   . VASECTOMY    . VENTRICULOPERITONEAL SHUNT      There were no vitals filed for this visit.  Subjective Assessment - 04/06/18 1705    Subjective  Patient reports no pain upon arrival however has some pain with exercise. Patient reports no falls or LOB since last session.     Pertinent History  62 yo Male s/p multiple strokes (most recent in Jan 2019) with impaired memory with early onset alzheimer's dementia. Patient has had PT back in 2017 to work on balance and gait with good results. He reports recently (last few months) he has had trouble getting up and moving around. He denies any recent falls. Family reports that per physcian if he didn't come to PT he would be wheelchair bound within  6 months. Patient alone during history intake which limited our information. He does present to therapy without AD. He reports catching himself often. He is not driving right now.     Limitations  Walking    How long can you sit comfortably?  NA    How long can you stand comfortably?  10-15 min;     How long can you walk comfortably?  about 400-500 feet with increased left hip/back pain;     Diagnostic tests  MRI has been ordered;     Currently in Pain?  No/denies    Pain Score  0-No pain    Pain Onset  More than a month ago       Therapeutic Exercise:  TM walking x 3 mins x 2 at 1.0 miles / hour  Standing exercises with RTB BLE : Marching 2 x 10;;cues to raise up  knees higher SLR 2 x 10; cues for performing slowly Abduction 2 x 10;cues for not leaning towards the opposite side Extension 2 x 10; cues not to lean forward with trunk Squats x 10 with 5 sec hold, cues to maintain erect position Heel raises x 10 x 2 ; cues to not rock forward Resisted side-steeping RTB 4 lengths x 2;; cues to not rotate trunk towards direction of mobility Sit to stand without UE support 2 x 10; cues for technique and posture correction Step-ups to 6" step x 10 bilateral; cues for getting entire foot on step Quantum leg press 90  # x 20 x 3; cues for not snapping LE in extension and performing slowly  Patient needs demonstration of exercises to perform them correctly and more time to process the information.  . CGA and Min to mod verbal cues used throughout with increased in postural sway and LOB most seen with narrow base of support and while on uneven surfaces.                        PT Education - 04/06/18 1705    Education Details  HEP    Person(s) Educated  Patient    Methods  Explanation    Comprehension  Verbalized understanding       PT Short Term Goals - 02/27/18 1704      PT SHORT TERM GOAL #1   Title  Patient will increase 10 meter walk test to >1.28ms as to improve gait speed for better community ambulation and to reduce fall risk.    Baseline  02/27/18: 12.9s = 0.78 m/s self selected without assistive device and 11.4s=0.88 m/s fastest speed without assistive device    Time  4    Period  Weeks    Status  On-going    Target Date  02/22/18      PT SHORT TERM GOAL #2   Title  Patient will increase BLE gross strength to 4+/5 as to improve functional strength for independent gait, increased standing tolerance and increased ADL ability.    Baseline  Bilateral hip flexion: 5/5, hip IR: 4+/5, hip ER: 5/5, hip abduction/aduction 4-/5, pt unable to lay prone to test hip extension, knee extension: 5/5, knee flexion: 4+/5, ankle dorsiflexion:  4+/5;     Time  4    Period  Weeks    Status  On-going    Target Date  02/22/18        PT Long Term Goals - 03/14/18 0907      PT LONG TERM GOAL #1  Title  Patient will increase Berg Balance score by > 6 points to demonstrate decreased fall risk during functional activities.    Baseline  02/27/18: 44/56    Time  6    Period  Weeks    Status  Achieved      PT LONG TERM GOAL #2   Title  Patient will be adherent to HEP at least 3x a week to improve functional strength and balance for better safety at home.    Baseline  02/27/18: "every other day"    Time  6    Period  Weeks    Status  Achieved      PT LONG TERM GOAL #3   Title  Patient will be accurate with finger to nose pointing x5 reps to improve fine motor coordination for grasping and using objects in UE when in community such as out to dinner.     Baseline  02/27/18: BUE coordination remains dysmetric, apraxia as well    Time  8    Period  Weeks    Status  On-going    Target Date  05/09/18      PT LONG TERM GOAL #4   Title  Patient will be require no assist with ascend/descend 3 steps using Least restrictive assistive device.    Baseline  02/27/18: Pt requires UE support to ascend/descend steps safely    Time  6    Period  Weeks    Status  On-going    Target Date  05/09/18      PT LONG TERM GOAL #5   Title   Patient will demonstrate an improved Berg Balance Score of > 50/56 as to demonstrate improved balance with ADLs such as sitting/standing and transfer balance and reduced fall risk.     Baseline  02/27/18: 44/56    Time  6    Period  Weeks    Status  Partially Met    Target Date  05/09/18            Plan - 04/06/18 1706    Clinical Impression Statement  Patient instructed in intermediate balance/strengthening exercise. Patient fatigues quickly requiring short rest breaks in between intermediate exercise. Patient instructed in advanced strengthening with increased repetition/resistance. Patient requires CGA for  intermediate balance exercise especially with less rail assist. Patient would benefit from additional skilled PT intervention to improve balance/gait safety and reduce fall risk.    Rehab Potential  Fair    Clinical Impairments Affecting Rehab Potential  concerned about progression of early onset alzheimers and how that affects mobility;     PT Frequency  2x / week    PT Duration  8 weeks    PT Treatment/Interventions  ADLs/Self Care Home Management;Cryotherapy;Moist Heat;Therapeutic exercise;Therapeutic activities;Functional mobility training;Stair training;Gait training;DME Instruction;Balance training;Neuromuscular re-education;Patient/family education;Energy conservation    PT Next Visit Plan  continue with balance and strengthening program    PT Home Exercise Plan  no updates this date.     Consulted and Agree with Plan of Care  Patient       Patient will benefit from skilled therapeutic intervention in order to improve the following deficits and impairments:  Abnormal gait, Decreased endurance, Decreased knowledge of precautions, Decreased activity tolerance, Decreased strength, Pain, Difficulty walking, Decreased mobility, Decreased balance, Decreased safety awareness  Visit Diagnosis: Difficulty in walking, not elsewhere classified  Unsteadiness on feet  Muscle weakness (generalized)  Cognitive communication deficit     Problem List Patient Active Problem List   Diagnosis  Date Noted  . Hypersalivation 01/30/2018  . Drooling 01/30/2018  . Alzheimer's dementia 05/01/2017  . Cognitive decline 10/20/2016  . Chest pain, rule out acute myocardial infarction 04/05/2016  . Confusion 10/18/2015  . Constipation 08/23/2015  . Uncontrolled type 2 diabetes mellitus with hyperglycemia, with long-term current use of insulin ([place]) 08/23/2015  . Essential hypertension 08/23/2015  . Right lower lobe pneumonia ([place]) 08/23/2015  . Abdominal pain 08/21/2015  . Altered mental status  02/19/2015    Alanson Puls, PT DPT 04/06/2018, 5:07 PM  [place] MAIN [facility] SERVICES [address] [place], [place], [address] Phone: [phone]   Fax:  [phone]  Name: Maurice Patterson MRN: [id number] Date of Birth: 22-Jan-1956

## 2018-04-11 ENCOUNTER — Ambulatory Visit: Payer: PPO | Admitting: Physical Therapy

## 2018-04-13 ENCOUNTER — Ambulatory Visit: Payer: PPO | Admitting: Physical Therapy

## 2018-04-18 ENCOUNTER — Ambulatory Visit: Payer: PPO | Admitting: Physical Therapy

## 2018-04-20 ENCOUNTER — Ambulatory Visit: Payer: PPO | Admitting: Physical Therapy

## 2018-04-24 DIAGNOSIS — E876 Hypokalemia: Secondary | ICD-10-CM | POA: Diagnosis not present

## 2018-04-24 DIAGNOSIS — E119 Type 2 diabetes mellitus without complications: Secondary | ICD-10-CM | POA: Diagnosis not present

## 2018-04-24 DIAGNOSIS — Z79899 Other long term (current) drug therapy: Secondary | ICD-10-CM | POA: Diagnosis not present

## 2018-04-24 DIAGNOSIS — C259 Malignant neoplasm of pancreas, unspecified: Secondary | ICD-10-CM | POA: Diagnosis not present

## 2018-04-24 DIAGNOSIS — R011 Cardiac murmur, unspecified: Secondary | ICD-10-CM | POA: Diagnosis not present

## 2018-04-24 DIAGNOSIS — I4891 Unspecified atrial fibrillation: Secondary | ICD-10-CM | POA: Diagnosis not present

## 2018-04-24 DIAGNOSIS — I10 Essential (primary) hypertension: Secondary | ICD-10-CM | POA: Diagnosis not present

## 2018-04-24 DIAGNOSIS — E162 Hypoglycemia, unspecified: Secondary | ICD-10-CM | POA: Diagnosis not present

## 2018-04-24 DIAGNOSIS — F0390 Unspecified dementia without behavioral disturbance: Secondary | ICD-10-CM | POA: Diagnosis not present

## 2018-04-24 DIAGNOSIS — G9340 Encephalopathy, unspecified: Secondary | ICD-10-CM | POA: Diagnosis not present

## 2018-04-24 DIAGNOSIS — R4182 Altered mental status, unspecified: Secondary | ICD-10-CM | POA: Diagnosis not present

## 2018-04-24 DIAGNOSIS — S0990XA Unspecified injury of head, initial encounter: Secondary | ICD-10-CM | POA: Diagnosis not present

## 2018-04-24 DIAGNOSIS — I4581 Long QT syndrome: Secondary | ICD-10-CM | POA: Diagnosis not present

## 2018-04-28 ENCOUNTER — Other Ambulatory Visit: Payer: Self-pay | Admitting: Neurology

## 2018-05-17 ENCOUNTER — Telehealth: Payer: Self-pay | Admitting: Neurology

## 2018-05-17 NOTE — Telephone Encounter (Signed)
[id number] And [id number] (08/16/18)  1 visit approved.

## 2018-05-18 ENCOUNTER — Ambulatory Visit (INDEPENDENT_AMBULATORY_CARE_PROVIDER_SITE_OTHER): Payer: PPO | Admitting: Neurology

## 2018-05-18 ENCOUNTER — Encounter: Payer: Self-pay | Admitting: Neurology

## 2018-05-18 VITALS — Ht 67.0 in | Wt 171.0 lb

## 2018-05-18 DIAGNOSIS — F0281 Dementia in other diseases classified elsewhere with behavioral disturbance: Secondary | ICD-10-CM

## 2018-05-18 DIAGNOSIS — K117 Disturbances of salivary secretion: Secondary | ICD-10-CM | POA: Diagnosis not present

## 2018-05-18 DIAGNOSIS — G300 Alzheimer's disease with early onset: Secondary | ICD-10-CM

## 2018-05-18 DIAGNOSIS — F02818 Dementia in other diseases classified elsewhere, unspecified severity, with other behavioral disturbance: Secondary | ICD-10-CM

## 2018-05-18 MED ORDER — GABAPENTIN 100 MG PO CAPS: ORAL_CAPSULE | ORAL | 11 refills | Status: DC

## 2018-05-18 MED ORDER — QUETIAPINE FUMARATE 25 MG PO TABS: 50.0000 mg | ORAL_TABLET | Freq: Every day | ORAL | 6 refills | Status: DC

## 2018-05-18 NOTE — Progress Notes (Signed)
**  Botox 100 units x 1 vial, NDC 0092-3300-76, Lot [id number], Exp 05/2020, office supply.//mck,rn**

## 2018-05-18 NOTE — Progress Notes (Signed)
PATIENT: Maurice Patterson DOB: 29-Dec-1955  Chief Complaint  Patient presents with  . Hypersalivation    Botox 100 units x 1 vial - office supply.  He is here with his daughter, Maurice Patterson.     HISTORICAL  Maurice Patterson is a 62 years old male, accompanied by his daughter Maurice Patterson, seen in refer by [facility] for evaluation of botulism toxin injection for hypersialorrhea  Both her parents died of Alzheimer's senior 82s,, he worked at [employer] job all his life, was noted to have gradual onset memory loss since 2014, gradually getting worse, recent office visit on Jan 09, 2018 Mini-Mental Status Examination was only 14/30,  He was also noted to have gradual onset gait abnormality, he is still able to dressing, feed himself, but has bowel and bladder accidents sometimes wearing diaper, in the last couple years, he also noticed gradual onset slow worsening excessive drooling, now holding tissues in his hand all the time, drooling on the floor, wet his pillow when woke up  UPDATE Sept 26 2019: He is accompanied by his daughter at visit, I was also able to talk with his wife phone, he had worsening dementia, sundowning phenomenon, diffuse at nighttime, often agitated, not sleep well at nighttime, despite taking gabapentin 100 mg 2 tablets in the morning, 3 tablets at nighttime,   Previous Botox injection for his hypersialorrhea, only helped him some,  REVIEW OF SYSTEMS: Full 14 system review of systems performed and notable only for memory loss,  ALLERGIES: Allergies  Allergen Reactions  . Hydrocodone Anaphylaxis  . Morphine Other (See Comments)    Loss of memory  . Ambien [Zolpidem] Other (See Comments)    delirium    . Brilinta [Ticagrelor] Other (See Comments)    Stroke   . Flexeril [Cyclobenzaprine] Other (See Comments)    delerium   . Flunitrazepam Other (See Comments)    ROHYPNOL (hallucinations)  . Haldol [Haloperidol Lactate] Other (See Comments)    delerium   .  Levetiracetam Diarrhea and Other (See Comments)    Unable to walk  . Lorazepam Hives  . Risperdal [Risperidone] Other (See Comments)    Delirium   . Trazodone Other (See Comments)    Delirium Can take in low doses   . Benadryl [Diphenhydramine Hcl (Sleep)] Rash  . Penicillins Rash    Mouth ulcers Has patient had a PCN reaction causing immediate rash, facial/tongue/throat swelling, SOB or lightheadedness with hypotension: Yes Has patient had a PCN reaction causing severe rash involving mucus membranes or skin necrosis: No Has patient had a PCN reaction that required hospitalization No Has patient had a PCN reaction occurring within the last 10 years: Yes If all of the above answers are "NO", then may proceed with Cephalosporin use.    HOME MEDICATIONS: Current Outpatient Medications  Medication Sig Dispense Refill  . acetaminophen (TYLENOL) 500 MG tablet Take 500 mg by mouth 3 (three) times daily.    Marland Kitchen aspirin EC 81 MG tablet Take 81 mg by mouth daily.    Marland Kitchen atorvastatin (LIPITOR) 80 MG tablet Take 80 mg by mouth daily.    . baclofen (LIORESAL) 10 MG tablet Take 1 tablet (10 mg total) by mouth at bedtime. 30 each 5  . botulinum toxin Type A (BOTOX) 100 units SOLR injection Inject 100 Units into the muscle every 3 (three) months.    . cholecalciferol (VITAMIN D) 1000 units tablet Take 1,000 Units by mouth daily.    . clopidogrel (PLAVIX) 75 MG  tablet Take 75 mg by mouth daily.     . colestipol (COLESTID) 1 g tablet Take 2 g by mouth daily at 12 noon.    . diphenoxylate-atropine (LOMOTIL) 2.5-0.025 MG tablet Take 1 tablet by mouth 3 (three) times daily as needed for diarrhea or loose stools.    . donepezil (ARICEPT) 10 MG tablet Take 1 tablet (10 mg total) by mouth 2 (two) times daily. 180 tablet 3  . furosemide (LASIX) 40 MG tablet Take 40 mg by mouth at bedtime.    . gabapentin (NEURONTIN) 100 MG capsule TAKE 2 CAPSULES BY MOUTH EVERY MORNING AND 3 CAPSULES EVERY NIGHT AT BEDTIME 150  capsule 0  . insulin NPH Human (HUMULIN N,NOVOLIN N) 100 UNIT/ML injection Inject 0.25 mLs (25 Units total) into the skin 2 (two) times daily. (Patient taking differently: Inject 30 Units into the skin 2 (two) times daily. ) 10 mL 11  . insulin regular (NOVOLIN R,HUMULIN R) 100 units/mL injection Inject 0.3 mLs (30 Units total) into the skin 3 (three) times daily with meals. (Patient taking differently: Inject 34 Units into the skin 3 (three) times daily before meals. 34 units subcutaneous 3 times daily with meals with an additional sliding scale of 8 units added with every 20 glucose reading over 200.) 10 mL 1  . isosorbide mononitrate (IMDUR) 30 MG 24 hr tablet Take 30 mg by mouth daily.     Marland Kitchen lamoTRIgine (LAMICTAL) 100 MG tablet Take 100 mg by mouth 2 (two) times daily.    Marland Kitchen lidocaine (LIDODERM) 5 % Place 1 patch onto the skin daily as needed (pain). Remove & Discard patch within 12 hours or as directed by MD (APPLIED TO PATIENT BACK FOR PAIN)    . losartan (COZAAR) 100 MG tablet Take 100 mg by mouth daily.    . memantine (NAMENDA) 10 MG tablet Take 1 tablet (10 mg total) by mouth 2 (two) times daily. 180 tablet 3  . metoprolol succinate (TOPROL-XL) 100 MG 24 hr tablet Take 100 mg by mouth daily. Take with or immediately following a meal.    . nitroGLYCERIN (NITROSTAT) 0.4 MG SL tablet Place 1 tablet under the tongue every 5 (five) minutes as needed for chest pain.     . Pancrelipase, Lip-Prot-Amyl, (ZENPEP) 25000 units CPEP Take 50,000 Units by mouth 3 (three) times daily with meals.    . pantoprazole (PROTONIX) 40 MG tablet Take 40 mg by mouth at bedtime.     . potassium chloride (K-DUR,KLOR-CON) 10 MEQ tablet Take 10 mEq by mouth 2 (two) times daily.     No current facility-administered medications for this visit.     PAST MEDICAL HISTORY: Past Medical History:  Diagnosis Date  . Acute MI ([place])   . Arthritis   . Back pain   . CAD (coronary artery disease)   . CHF (congestive heart  failure) ([place])   . Chronic kidney disease   . Degenerative lumbar disc   . Dementia   . Diabetes mellitus without complication ([place])   . GERD (gastroesophageal reflux disease)   . Hernia of abdominal cavity   . Hyperlipemia   . Hypertension   . Malignant intraductal papillary mucinous tumor of pancreas ([place])   . Memory loss   . Pancreatitis   . Seizures ([place])    staring spells  . Shingles   . Stroke ([place]) 08/2017  . TIA (transient ischemic attack)     PAST SURGICAL HISTORY: Past Surgical History:  Procedure Laterality Date  .  CARDIAC CATHETERIZATION    . CORONARY ANGIOPLASTY    . CYSTOSCOPY WITH STENT PLACEMENT Right 05/13/2017   Procedure: CYSTOSCOPY WITH STENT PLACEMENT;  Surgeon: Nickie Retort, MD;  Location: [facility] ORS;  Service: Urology;  Laterality: Right;  . ERCP N/A 09/12/2015   Procedure: ENDOSCOPIC RETROGRADE CHOLANGIOPANCREATOGRAPHY (ERCP);  Surgeon: Hulen Luster, MD;  Location: [facility] ENDOSCOPY;  Service: Gastroenterology;  Laterality: N/A;  . ERCP N/A 01/02/2016   Procedure: ENDOSCOPIC RETROGRADE CHOLANGIOPANCREATOGRAPHY (ERCP);  Surgeon: Hulen Luster, MD;  Location: [facility] END[facility];  Service: Gastroenterology;  Laterality: N/A;  . EXTERNAL FIXATION WRIST FRACTURE Left   . left arm metal plate    . LEFT HEART CATH AND CORONARY ANGIOGRAPHY Left 11/04/2016   Procedure: Left Heart Cath and Coronary Angiography;  Surgeon: Isaias Cowman, MD;  Location: [place] CV LAB;  Service: Cardiovascular;  Laterality: Left;  . Left shoulder surgery    . Stent times 3  2013   Cardiac  . TOTAL ELBOW REPLACEMENT Left   . VASECTOMY    . VENTRICULOPERITONEAL SHUNT      FAMILY HISTORY: Family History  Problem Relation Age of Onset  . Diabetes Mother   . Hypertension Mother   . CAD Mother   . Hyperlipidemia Mother   . Stroke Mother   . ALS Mother   . Alzheimer's disease Father   . Diabetes Father   . Heart Problems Brother     SOCIAL HISTORY:  Social History    Socioeconomic History  . Marital status: Married    Spouse name: Not on file  . Number of children: 2  . Years of education: BS  . Highest education level: Not on file  Occupational History  . Occupation: Disabled  Social Needs  . Financial resource strain: Not on file  . Food insecurity:    Worry: Not on file    Inability: Not on file  . Transportation needs:    Medical: Not on file    Non-medical: Not on file  Tobacco Use  . Smoking status: Former Smoker    Last attempt to quit: 2013    Years since quitting: 6.7  . Smokeless tobacco: Never Used  . Tobacco comment: used to smoke 2PD for 40 yrs, quit about 4 years ago  Substance and Sexual Activity  . Alcohol use: No    Alcohol/week: 0.0 standard drinks    Comment: occasional  . Drug use: No  . Sexual activity: Not on file  Lifestyle  . Physical activity:    Days per week: Not on file    Minutes per session: Not on file  . Stress: Not on file  Relationships  . Social connections:    Talks on phone: Not on file    Gets together: Not on file    Attends religious service: Not on file    Active member of club or organization: Not on file    Attends meetings of clubs or organizations: Not on file    Relationship status: Not on file  . Intimate partner violence:    Fear of current or ex partner: Not on file    Emotionally abused: Not on file    Physically abused: Not on file    Forced sexual activity: Not on file  Other Topics Concern  . Not on file  Social History Narrative   Lives at home with wife and son. Ambulatory at baseline.   Right-handed   Caffeine: 2 sodas per day     PHYSICAL  EXAM   Vitals:   05/18/18 1435  Weight: 171 lb (77.6 kg)  Height: 5\' 7"  (1.702 m)    Not recorded      Body mass index is 26.78 kg/m.  PHYSICAL EXAMNIATION:  Gen: NAD, conversant, well nourised, obese, well groomed                     Cardiovascular: Regular rate rhythm, no peripheral edema, warm,  nontender. Eyes: Conjunctivae clear without exudates or hemorrhage Neck: Supple, no carotid bruits. Pulmonary: Clear to auscultation bilaterally   NEUROLOGICAL EXAM:  MENTAL STATUS: Speech: Endentured, wet speech, slurred following commands,  Cognition: Oriented to his age  CRANIAL NERVES: CN II: Visual fields are full to confrontation.  Pupils are round equal and briskly reactive to light. CN III, IV, VI: extraocular movement are normal. No ptosis. CN V: Facial sensation is intact to pinprick in all 3 divisions bilaterally. Corneal responses are intact.  CN VII: Face is symmetric with normal eye closure and smile. CN VIII: Hearing is normal to rubbing fingers CN IX, X: Palate elevates symmetrically. Phonation is normal. CN XI: Head turning and shoulder shrug are intact CN XII: Tongue is midline with normal movements and no atrophy.  MOTOR: Left arm well-healed surgical incision, limited range of motion of left wrist, mild bilateral upper and lower extremity rigidity  REFLEXES: Reflexes are 2+ and symmetric at the biceps, triceps, knees, and ankles. Plantar responses are flexor.  SENSORY: Intact to light touch, pinprick, positional sensation and vibratory sensation are intact in fingers and toes.  COORDINATION: Rapid alternating movements and fine finger movements are intact. There is no dysmetria on finger-to-nose and heel-knee-shin.    GAIT/STANCE: Need to push up getting up from seated position, leaning forward, cautious, unsteady decreased left arm swing,  DIAGNOSTIC DATA (LABS, IMAGING, TESTING) - I reviewed patient records, labs, notes, testing and imaging myself where available.   ASSESSMENT AND PLAN  Maurice Patterson is a 62 y.o. male   Dementia with agitation  Will add on Seroquel titrating to 25 mg at night for evening, agitation  Hypersialorrhea  BOTOX injection for hyper-sialorrhea   We used the Botox a 100 units, 50 units for each parotid gland at  multiple injection site,  He tolerated injection well   Maurice Patterson, M.D. Ph.D.  [facility] [address], [place], [place] [address] Ph: [phone] Fax: [phone]  CC: Referring Provider

## 2018-06-03 ENCOUNTER — Other Ambulatory Visit: Payer: Self-pay | Admitting: Neurology

## 2018-07-13 ENCOUNTER — Other Ambulatory Visit: Payer: Self-pay | Admitting: Adult Health

## 2018-07-22 ENCOUNTER — Emergency Department: Payer: PPO

## 2018-07-22 ENCOUNTER — Other Ambulatory Visit: Payer: Self-pay

## 2018-07-22 ENCOUNTER — Inpatient Hospital Stay
Admission: EM | Admit: 2018-07-22 | Discharge: 2018-09-08 | DRG: 870 | Disposition: A | Discharge status: Skilled Nursing Facility | Payer: PPO | Attending: Internal Medicine | Admitting: Internal Medicine

## 2018-07-22 DIAGNOSIS — F05 Delirium due to known physiological condition: Secondary | ICD-10-CM | POA: Diagnosis present

## 2018-07-22 DIAGNOSIS — W06XXXA Fall from bed, initial encounter: Secondary | ICD-10-CM | POA: Diagnosis not present

## 2018-07-22 DIAGNOSIS — Z7902 Long term (current) use of antithrombotics/antiplatelets: Secondary | ICD-10-CM

## 2018-07-22 DIAGNOSIS — R402144 Coma scale, eyes open, spontaneous, 24 hours or more after hospital admission: Secondary | ICD-10-CM | POA: Diagnosis not present

## 2018-07-22 DIAGNOSIS — K861 Other chronic pancreatitis: Secondary | ICD-10-CM | POA: Diagnosis present

## 2018-07-22 DIAGNOSIS — G309 Alzheimer's disease, unspecified: Secondary | ICD-10-CM | POA: Diagnosis present

## 2018-07-22 DIAGNOSIS — E870 Hyperosmolality and hypernatremia: Secondary | ICD-10-CM | POA: Diagnosis not present

## 2018-07-22 DIAGNOSIS — J9601 Acute respiratory failure with hypoxia: Secondary | ICD-10-CM

## 2018-07-22 DIAGNOSIS — R4182 Altered mental status, unspecified: Secondary | ICD-10-CM

## 2018-07-22 DIAGNOSIS — Z9911 Dependence on respirator [ventilator] status: Secondary | ICD-10-CM

## 2018-07-22 DIAGNOSIS — Y95 Nosocomial condition: Secondary | ICD-10-CM | POA: Diagnosis present

## 2018-07-22 DIAGNOSIS — I5032 Chronic diastolic (congestive) heart failure: Secondary | ICD-10-CM | POA: Diagnosis present

## 2018-07-22 DIAGNOSIS — J9622 Acute and chronic respiratory failure with hypercapnia: Secondary | ICD-10-CM | POA: Diagnosis present

## 2018-07-22 DIAGNOSIS — A419 Sepsis, unspecified organism: Principal | ICD-10-CM | POA: Diagnosis present

## 2018-07-22 DIAGNOSIS — Z96622 Presence of left artificial elbow joint: Secondary | ICD-10-CM | POA: Diagnosis present

## 2018-07-22 DIAGNOSIS — G039 Meningitis, unspecified: Secondary | ICD-10-CM | POA: Diagnosis present

## 2018-07-22 DIAGNOSIS — R64 Cachexia: Secondary | ICD-10-CM | POA: Diagnosis present

## 2018-07-22 DIAGNOSIS — N170 Acute kidney failure with tubular necrosis: Secondary | ICD-10-CM | POA: Diagnosis not present

## 2018-07-22 DIAGNOSIS — Z794 Long term (current) use of insulin: Secondary | ICD-10-CM

## 2018-07-22 DIAGNOSIS — J9811 Atelectasis: Secondary | ICD-10-CM | POA: Diagnosis present

## 2018-07-22 DIAGNOSIS — Z0189 Encounter for other specified special examinations: Secondary | ICD-10-CM

## 2018-07-22 DIAGNOSIS — I69320 Aphasia following cerebral infarction: Secondary | ICD-10-CM

## 2018-07-22 DIAGNOSIS — J9600 Acute respiratory failure, unspecified whether with hypoxia or hypercapnia: Secondary | ICD-10-CM

## 2018-07-22 DIAGNOSIS — E876 Hypokalemia: Secondary | ICD-10-CM | POA: Diagnosis present

## 2018-07-22 DIAGNOSIS — M549 Dorsalgia, unspecified: Secondary | ICD-10-CM | POA: Diagnosis present

## 2018-07-22 DIAGNOSIS — T17908A Unspecified foreign body in respiratory tract, part unspecified causing other injury, initial encounter: Secondary | ICD-10-CM

## 2018-07-22 DIAGNOSIS — Z79899 Other long term (current) drug therapy: Secondary | ICD-10-CM

## 2018-07-22 DIAGNOSIS — Z431 Encounter for attention to gastrostomy: Secondary | ICD-10-CM

## 2018-07-22 DIAGNOSIS — R633 Feeding difficulties, unspecified: Secondary | ICD-10-CM

## 2018-07-22 DIAGNOSIS — F0280 Dementia in other diseases classified elsewhere without behavioral disturbance: Secondary | ICD-10-CM | POA: Diagnosis present

## 2018-07-22 DIAGNOSIS — R05 Cough: Secondary | ICD-10-CM

## 2018-07-22 DIAGNOSIS — R0902 Hypoxemia: Secondary | ICD-10-CM

## 2018-07-22 DIAGNOSIS — J189 Pneumonia, unspecified organism: Secondary | ICD-10-CM | POA: Diagnosis not present

## 2018-07-22 DIAGNOSIS — Z8661 Personal history of infections of the central nervous system: Secondary | ICD-10-CM

## 2018-07-22 DIAGNOSIS — G839 Paralytic syndrome, unspecified: Secondary | ICD-10-CM | POA: Diagnosis present

## 2018-07-22 DIAGNOSIS — Z960 Presence of urogenital implants: Secondary | ICD-10-CM | POA: Diagnosis present

## 2018-07-22 DIAGNOSIS — R509 Fever, unspecified: Secondary | ICD-10-CM

## 2018-07-22 DIAGNOSIS — Z978 Presence of other specified devices: Secondary | ICD-10-CM

## 2018-07-22 DIAGNOSIS — Z87891 Personal history of nicotine dependence: Secondary | ICD-10-CM

## 2018-07-22 DIAGNOSIS — I69311 Memory deficit following cerebral infarction: Secondary | ICD-10-CM

## 2018-07-22 DIAGNOSIS — Z6821 Body mass index (BMI) 21.0-21.9, adult: Secondary | ICD-10-CM

## 2018-07-22 DIAGNOSIS — I252 Old myocardial infarction: Secondary | ICD-10-CM

## 2018-07-22 DIAGNOSIS — M479 Spondylosis, unspecified: Secondary | ICD-10-CM | POA: Diagnosis present

## 2018-07-22 DIAGNOSIS — Z7189 Other specified counseling: Secondary | ICD-10-CM

## 2018-07-22 DIAGNOSIS — I10 Essential (primary) hypertension: Secondary | ICD-10-CM | POA: Diagnosis present

## 2018-07-22 DIAGNOSIS — J181 Lobar pneumonia, unspecified organism: Secondary | ICD-10-CM

## 2018-07-22 DIAGNOSIS — R1310 Dysphagia, unspecified: Secondary | ICD-10-CM | POA: Diagnosis present

## 2018-07-22 DIAGNOSIS — L8990 Pressure ulcer of unspecified site, unspecified stage: Secondary | ICD-10-CM

## 2018-07-22 DIAGNOSIS — E871 Hypo-osmolality and hyponatremia: Secondary | ICD-10-CM | POA: Diagnosis not present

## 2018-07-22 DIAGNOSIS — Z885 Allergy status to narcotic agent status: Secondary | ICD-10-CM

## 2018-07-22 DIAGNOSIS — Z4689 Encounter for fitting and adjustment of other specified devices: Secondary | ICD-10-CM

## 2018-07-22 DIAGNOSIS — I130 Hypertensive heart and chronic kidney disease with heart failure and stage 1 through stage 4 chronic kidney disease, or unspecified chronic kidney disease: Secondary | ICD-10-CM | POA: Diagnosis present

## 2018-07-22 DIAGNOSIS — Z888 Allergy status to other drugs, medicaments and biological substances status: Secondary | ICD-10-CM

## 2018-07-22 DIAGNOSIS — Z8507 Personal history of malignant neoplasm of pancreas: Secondary | ICD-10-CM

## 2018-07-22 DIAGNOSIS — L89151 Pressure ulcer of sacral region, stage 1: Secondary | ICD-10-CM | POA: Diagnosis present

## 2018-07-22 DIAGNOSIS — I2510 Atherosclerotic heart disease of native coronary artery without angina pectoris: Secondary | ICD-10-CM | POA: Diagnosis present

## 2018-07-22 DIAGNOSIS — R7989 Other specified abnormal findings of blood chemistry: Secondary | ICD-10-CM | POA: Diagnosis present

## 2018-07-22 DIAGNOSIS — J9621 Acute and chronic respiratory failure with hypoxia: Secondary | ICD-10-CM | POA: Diagnosis present

## 2018-07-22 DIAGNOSIS — R4702 Dysphasia: Secondary | ICD-10-CM | POA: Diagnosis present

## 2018-07-22 DIAGNOSIS — K759 Inflammatory liver disease, unspecified: Secondary | ICD-10-CM

## 2018-07-22 DIAGNOSIS — Z982 Presence of cerebrospinal fluid drainage device: Secondary | ICD-10-CM

## 2018-07-22 DIAGNOSIS — E46 Unspecified protein-calorie malnutrition: Secondary | ICD-10-CM | POA: Diagnosis present

## 2018-07-22 DIAGNOSIS — K259 Gastric ulcer, unspecified as acute or chronic, without hemorrhage or perforation: Secondary | ICD-10-CM | POA: Diagnosis present

## 2018-07-22 DIAGNOSIS — R059 Cough, unspecified: Secondary | ICD-10-CM

## 2018-07-22 DIAGNOSIS — E785 Hyperlipidemia, unspecified: Secondary | ICD-10-CM | POA: Diagnosis present

## 2018-07-22 DIAGNOSIS — Z4659 Encounter for fitting and adjustment of other gastrointestinal appliance and device: Secondary | ICD-10-CM

## 2018-07-22 DIAGNOSIS — R402344 Coma scale, best motor response, flexion withdrawal, 24 hours or more after hospital admission: Secondary | ICD-10-CM | POA: Diagnosis not present

## 2018-07-22 DIAGNOSIS — R531 Weakness: Secondary | ICD-10-CM | POA: Diagnosis not present

## 2018-07-22 DIAGNOSIS — E1122 Type 2 diabetes mellitus with diabetic chronic kidney disease: Secondary | ICD-10-CM | POA: Diagnosis present

## 2018-07-22 DIAGNOSIS — B004 Herpesviral encephalitis: Secondary | ICD-10-CM | POA: Diagnosis present

## 2018-07-22 DIAGNOSIS — E1165 Type 2 diabetes mellitus with hyperglycemia: Secondary | ICD-10-CM | POA: Diagnosis present

## 2018-07-22 DIAGNOSIS — Z515 Encounter for palliative care: Secondary | ICD-10-CM | POA: Diagnosis not present

## 2018-07-22 DIAGNOSIS — G253 Myoclonus: Secondary | ICD-10-CM | POA: Diagnosis present

## 2018-07-22 DIAGNOSIS — R740 Nonspecific elevation of levels of transaminase and lactic acid dehydrogenase [LDH]: Secondary | ICD-10-CM | POA: Diagnosis not present

## 2018-07-22 DIAGNOSIS — Z880 Allergy status to penicillin: Secondary | ICD-10-CM

## 2018-07-22 DIAGNOSIS — R402214 Coma scale, best verbal response, none, 24 hours or more after hospital admission: Secondary | ICD-10-CM | POA: Diagnosis not present

## 2018-07-22 DIAGNOSIS — I350 Nonrheumatic aortic (valve) stenosis: Secondary | ICD-10-CM | POA: Diagnosis present

## 2018-07-22 DIAGNOSIS — R413 Other amnesia: Secondary | ICD-10-CM | POA: Diagnosis not present

## 2018-07-22 DIAGNOSIS — Z955 Presence of coronary angioplasty implant and graft: Secondary | ICD-10-CM

## 2018-07-22 DIAGNOSIS — J690 Pneumonitis due to inhalation of food and vomit: Secondary | ICD-10-CM | POA: Diagnosis present

## 2018-07-22 DIAGNOSIS — K8681 Exocrine pancreatic insufficiency: Secondary | ICD-10-CM | POA: Diagnosis present

## 2018-07-22 DIAGNOSIS — Z7982 Long term (current) use of aspirin: Secondary | ICD-10-CM

## 2018-07-22 DIAGNOSIS — N183 Chronic kidney disease, stage 3 (moderate): Secondary | ICD-10-CM | POA: Diagnosis present

## 2018-07-22 DIAGNOSIS — K219 Gastro-esophageal reflux disease without esophagitis: Secondary | ICD-10-CM | POA: Diagnosis present

## 2018-07-22 DIAGNOSIS — Y92230 Patient room in hospital as the place of occurrence of the external cause: Secondary | ICD-10-CM | POA: Diagnosis not present

## 2018-07-22 DIAGNOSIS — M1990 Unspecified osteoarthritis, unspecified site: Secondary | ICD-10-CM | POA: Diagnosis present

## 2018-07-22 DIAGNOSIS — R5381 Other malaise: Secondary | ICD-10-CM | POA: Diagnosis not present

## 2018-07-22 DIAGNOSIS — G9341 Metabolic encephalopathy: Secondary | ICD-10-CM | POA: Diagnosis present

## 2018-07-22 LAB — URINALYSIS, COMPLETE (UACMP) WITH MICROSCOPIC
Bacteria, UA: NONE SEEN
Bilirubin Urine: NEGATIVE
Glucose, UA: NEGATIVE mg/dL
Hgb urine dipstick: NEGATIVE
Ketones, ur: NEGATIVE mg/dL
Leukocytes, UA: NEGATIVE
Nitrite: NEGATIVE
Protein, ur: 30 mg/dL — AB
Specific Gravity, Urine: 1.028 (ref 1.005–1.030)
Squamous Epithelial / HPF: NONE SEEN (ref 0–5)
pH: 5 (ref 5.0–8.0)

## 2018-07-22 LAB — CBC WITH DIFFERENTIAL/PLATELET
Abs Immature Granulocytes: 0.06 10*3/uL (ref 0.00–0.07)
Basophils Absolute: 0 10*3/uL (ref 0.0–0.1)
Basophils Relative: 0 %
Eosinophils Absolute: 0.1 10*3/uL (ref 0.0–0.5)
Eosinophils Relative: 1 %
HEMATOCRIT: 38.5 % — AB (ref 39.0–52.0)
Hemoglobin: 12.7 g/dL — ABNORMAL LOW (ref 13.0–17.0)
Immature Granulocytes: 1 %
LYMPHS ABS: 1.7 10*3/uL (ref 0.7–4.0)
LYMPHS PCT: 16 %
MCH: 29.5 pg (ref 26.0–34.0)
MCHC: 33 g/dL (ref 30.0–36.0)
MCV: 89.3 fL (ref 80.0–100.0)
Monocytes Absolute: 1 10*3/uL (ref 0.1–1.0)
Monocytes Relative: 10 %
Neutro Abs: 7.7 10*3/uL (ref 1.7–7.7)
Neutrophils Relative %: 72 %
Platelets: 286 10*3/uL (ref 150–400)
RBC: 4.31 MIL/uL (ref 4.22–5.81)
RDW: 13.8 % (ref 11.5–15.5)
WBC: 10.5 10*3/uL (ref 4.0–10.5)
nRBC: 0 % (ref 0.0–0.2)

## 2018-07-22 LAB — COMPREHENSIVE METABOLIC PANEL
ALT: 13 U/L (ref 0–44)
AST: 21 U/L (ref 15–41)
Albumin: 3.4 g/dL — ABNORMAL LOW (ref 3.5–5.0)
Alkaline Phosphatase: 141 U/L — ABNORMAL HIGH (ref 38–126)
Anion gap: 9 (ref 5–15)
BUN: 11 mg/dL (ref 8–23)
CO2: 29 mmol/L (ref 22–32)
CREATININE: 0.76 mg/dL (ref 0.61–1.24)
Calcium: 8.3 mg/dL — ABNORMAL LOW (ref 8.9–10.3)
Chloride: 102 mmol/L (ref 98–111)
GFR calc Af Amer: >60 mL/min (ref >60)
GFR calc non Af Amer: >60 mL/min (ref >60)
Glucose, Bld: 164 mg/dL — ABNORMAL HIGH (ref 70–99)
Potassium: 2.8 mmol/L — ABNORMAL LOW (ref 3.5–5.1)
Sodium: 140 mmol/L (ref 135–145)
Total Bilirubin: 0.7 mg/dL (ref 0.3–1.2)
Total Protein: 7 g/dL (ref 6.5–8.1)

## 2018-07-22 LAB — URINE DRUG SCREEN, QUALITATIVE (ARMC ONLY)
Amphetamines, Ur Screen: NOT DETECTED
Barbiturates, Ur Screen: NOT DETECTED
Benzodiazepine, Ur Scrn: NOT DETECTED
Cannabinoid 50 Ng, Ur ~~LOC~~: NOT DETECTED
Cocaine Metabolite,Ur ~~LOC~~: NOT DETECTED
MDMA (ECSTASY) UR SCREEN: NOT DETECTED
Methadone Scn, Ur: NOT DETECTED
OPIATE, UR SCREEN: NOT DETECTED
Phencyclidine (PCP) Ur S: NOT DETECTED
Tricyclic, Ur Screen: NOT DETECTED

## 2018-07-22 LAB — BRAIN NATRIURETIC PEPTIDE: B Natriuretic Peptide: 40 pg/mL (ref 0.0–100.0)

## 2018-07-22 LAB — TROPONIN I: Troponin I: <0.03 ng/mL (ref <0.03)

## 2018-07-22 LAB — GLUCOSE, CAPILLARY: Glucose-Capillary: 159 mg/dL — ABNORMAL HIGH (ref 70–99)

## 2018-07-22 MED ORDER — POTASSIUM CHLORIDE 10 MEQ/100ML IV SOLN: 10.0000 meq | Freq: Once | INTRAVENOUS | Status: DC

## 2018-07-22 MED ORDER — POTASSIUM CHLORIDE 10 MEQ/100ML IV SOLN
10.0000 meq | Freq: Once | INTRAVENOUS | Status: DC
  Filled 2018-07-22: qty 100

## 2018-07-22 MED ORDER — MAGNESIUM SULFATE 2 GM/50ML IV SOLN
2.0000 g | Freq: Once | INTRAVENOUS | Status: AC
  Administered 2018-07-23: 2 g via INTRAVENOUS
  Filled 2018-07-22: qty 50

## 2018-07-22 MED ORDER — SODIUM CHLORIDE 0.9 % IV SOLN
500.0000 mg | Freq: Once | INTRAVENOUS | Status: DC
  Filled 2018-07-22: qty 500

## 2018-07-22 MED ORDER — SODIUM CHLORIDE 0.9 % IV SOLN
1.0000 g | Freq: Once | INTRAVENOUS | Status: DC
  Filled 2018-07-22: qty 10

## 2018-07-22 MED ORDER — SODIUM CHLORIDE 0.9 % IV BOLUS
1000.0000 mL | Freq: Once | INTRAVENOUS | Status: AC
  Administered 2018-07-22: 1000 mL via INTRAVENOUS

## 2018-07-22 NOTE — ED Notes (Signed)
Medication list at bedside.

## 2018-07-22 NOTE — ED Provider Notes (Signed)
[facility] Emergency Department Provider Note  ____________________________________________   First MD Initiated Contact with Patient 07/22/18 2257     (approximate)  I have reviewed the triage vital signs and the nursing notes.   HISTORY  Chief Complaint No chief complaint on file.  Level 5 exemption history limited by the patient's altered mental status  HPI Maurice Patterson is a 62 y.o. male is brought to the emergency department by EMS with 2 days of progressive lethargy and weakness.  According to the patient's wife he has a history of very mild dementia and normally is able to care for himself and ambulating around the house and taking care of his ADLs.  For the past 48 hours he has been increasingly lethargic, unable to ambulate, and unable to care for himself.  She notes no fever at home.  She is concerned that he may have had a stroke.  He has had no rhinorrhea.  No shortness of breath.  No cough.  He is not speaking very much.  EMS noted a blood sugar of 185 in route along with unremarkable vital signs.    Past Medical History:  Diagnosis Date  . Acute MI ([place])   . Arthritis   . Back pain   . CAD (coronary artery disease)   . CHF (congestive heart failure) ([place])   . Chronic kidney disease   . Degenerative lumbar disc   . Dementia ([place])   . Diabetes mellitus without complication ([place])   . GERD (gastroesophageal reflux disease)   . Hernia of abdominal cavity   . Hyperlipemia   . Hypertension   . Malignant intraductal papillary mucinous tumor of pancreas ([place])   . Memory loss   . Pancreatitis   . Seizures ([place])    staring spells  . Shingles   . Stroke ([place]) 08/2017  . TIA (transient ischemic attack)     Patient Active Problem List   Diagnosis Date Noted  . CAP (community acquired pneumonia) 07/22/2018  . GERD (gastroesophageal reflux disease) 07/22/2018  . Hypersalivation 01/30/2018  . Drooling 01/30/2018  . Alzheimer's dementia  ([place]) 05/01/2017  . Cognitive decline 10/20/2016  . Chest pain, rule out acute myocardial infarction 04/05/2016  . Confusion 10/18/2015  . Constipation 08/23/2015  . Uncontrolled type 2 diabetes mellitus with hyperglycemia, with long-term current use of insulin ([name]) 08/23/2015  . Essential hypertension 08/23/2015  . Right lower lobe pneumonia ([place]) 08/23/2015  . Abdominal pain 08/21/2015  . Altered mental status 02/19/2015    Past Surgical History:  Procedure Laterality Date  . CARDIAC CATHETERIZATION    . CORONARY ANGIOPLASTY    . CYSTOSCOPY WITH STENT PLACEMENT Right 05/13/2017   Procedure: CYSTOSCOPY WITH STENT PLACEMENT;  Surgeon: Nickie Retort, MD;  Location: [facility] ORS;  Service: Urology;  Laterality: Right;  . ERCP N/A 09/12/2015   Procedure: ENDOSCOPIC RETROGRADE CHOLANGIOPANCREATOGRAPHY (ERCP);  Surgeon: Hulen Luster, MD;  Location: [facility] ENDOSCOPY;  Service: Gastroenterology;  Laterality: N/A;  . ERCP N/A 01/02/2016   Procedure: ENDOSCOPIC RETROGRADE CHOLANGIOPANCREATOGRAPHY (ERCP);  Surgeon: Hulen Luster, MD;  Location: [facility] ENDOSCOPY;  Service: Gastroenterology;  Laterality: N/A;  . EXTERNAL FIXATION WRIST FRACTURE Left   . left arm metal plate    . LEFT HEART CATH AND CORONARY ANGIOGRAPHY Left 11/04/2016   Procedure: Left Heart Cath and Coronary Angiography;  Surgeon: Isaias Cowman, MD;  Location: [facility];  Service: Cardiovascular;  Laterality: Left;  . Left shoulder surgery    .  Stent times 3  2013   Cardiac  . TOTAL ELBOW REPLACEMENT Left   . VASECTOMY    . VENTRICULOPERITONEAL SHUNT      Prior to Admission medications   Medication Sig Start Date End Date Taking? Authorizing Provider  acetaminophen (TYLENOL) 500 MG tablet Take 500 mg by mouth 3 (three) times daily.   Yes [provider]  aspirin EC 81 MG tablet Take 81 mg by mouth daily.   Yes [provider]  botulinum toxin Type A (BOTOX) 100 units SOLR injection Inject 100 Units  into the muscle every 3 (three) months.   Yes [provider]  cholecalciferol (VITAMIN D) 1000 units tablet Take 1,000 Units by mouth daily.    Yes [provider]  clopidogrel (PLAVIX) 75 MG tablet Take 75 mg by mouth daily.    Yes [provider]  colestipol (COLESTID) 1 g tablet Take 1 g by mouth daily.    Yes [provider]  diphenoxylate-atropine (LOMOTIL) 2.5-0.025 MG tablet Take 1 tablet by mouth 3 (three) times daily as needed for diarrhea or loose stools.   Yes [provider]  donepezil (ARICEPT) 10 MG tablet Take 1 tablet (10 mg total) by mouth 2 (two) times daily. 01/09/18  Yes Ward Givens, NP  furosemide (LASIX) 40 MG tablet Take 40 mg by mouth daily.    Yes [provider]  insulin NPH Human (HUMULIN N,NOVOLIN N) 100 UNIT/ML injection Inject 0.25 mLs (25 Units total) into the skin 2 (two) times daily. Patient taking differently: Inject 32 Units into the skin 2 (two) times daily.  04/05/16  Yes Sudini, Alveta Heimlich, MD  insulin regular (NOVOLIN R,HUMULIN R) 100 units/mL injection Inject 0.3 mLs (30 Units total) into the skin 3 (three) times daily with meals. Patient taking differently: Inject 34 Units into the skin 3 (three) times daily before meals. 34 units subcutaneous 3 times daily with meals with an additional sliding scale of 8 units added with every 20 glucose reading over 200. 04/05/16  Yes Sudini, Alveta Heimlich, MD  isosorbide mononitrate (IMDUR) 30 MG 24 hr tablet Take 30 mg by mouth daily.  10/07/16  Yes [provider]  lamoTRIgine (LAMICTAL) 100 MG tablet Take 100 mg by mouth 2 (two) times daily.   Yes [provider]  lidocaine (LIDODERM) 5 % Place 1 patch onto the skin daily as needed (pain). Remove & Discard patch within 12 hours or as directed by MD (APPLIED TO PATIENT BACK FOR PAIN)   Yes [provider]  losartan (COZAAR) 100 MG tablet Take 100 mg by mouth daily.   Yes [provider]    memantine (NAMENDA) 10 MG tablet Take 1 tablet (10 mg total) by mouth 2 (two) times daily. 01/09/18  Yes Ward Givens, NP  metoprolol succinate (TOPROL-XL) 50 MG 24 hr tablet Take 50 mg by mouth daily. Take with or immediately following a meal.    Yes [provider]  pantoprazole (PROTONIX) 40 MG tablet Take 40 mg by mouth daily.  10/04/16  Yes [provider]  potassium chloride (K-DUR) 10 MEQ tablet Take 10 mEq by mouth 2 (two) times daily. 07/14/18  Yes [provider]  nitroGLYCERIN (NITROSTAT) 0.4 MG SL tablet Place 1 tablet under the tongue every 5 (five) minutes as needed for chest pain.     [provider]    Allergies Hydrocodone; Morphine; Ambien [zolpidem]; Brilinta [ticagrelor]; Flexeril [cyclobenzaprine]; Flunitrazepam; Haldol [haloperidol lactate]; Levetiracetam; Lorazepam; Risperdal [risperidone]; Trazodone; Benadryl [diphenhydramine hcl (  sleep)]; and Penicillins  Family History  Problem Relation Age of Onset  . Diabetes Mother   . Hypertension Mother   . CAD Mother   . Hyperlipidemia Mother   . Stroke Mother   . ALS Mother   . Alzheimer's disease Father   . Diabetes Father   . Heart Problems Brother     Social History Social History   Tobacco Use  . Smoking status: Former Smoker    Last attempt to quit: 2013    Years since quitting: 6.9  . Smokeless tobacco: Never Used  . Tobacco comment: used to smoke 2PD for 40 yrs, quit about 4 years ago  Substance Use Topics  . Alcohol use: No    Alcohol/week: 0.0 standard drinks    Comment: occasional  . Drug use: No    Review of Systems Level 5 exemption history limited by the patient's clinical condition  ____________________________________________   PHYSICAL EXAM:  VITAL SIGNS: ED Triage Vitals  Enc Vitals Group     BP 07/22/18 2229 (!) 158/106     Pulse Rate 07/22/18 2229 89     Resp 07/22/18 2229 15     Temp 07/22/18 2229 98.4 F (36.9 C)     Temp Source 07/22/18  2229 Oral     SpO2 07/22/18 2229 99 %     Weight 07/22/18 2231 171 lb 1.2 oz (77.6 kg)     Height 07/22/18 2231 5\' 8"  (1.727 m)     Head Circumference --      Peak Flow --      Pain Score --      Pain Loc --      Pain Edu? --      Excl. in [place]? --     Constitutional: Nonverbal.  No respiratory distress, although somewhat gurgling upper breath sounds. Eyes: PERRL EOMI. midrange and brisk Head: Atraumatic. Nose: No congestion/rhinnorhea. Mouth/Throat: No trismus Neck: No stridor.   Cardiovascular: Normal rate, regular rhythm. Grossly normal heart sounds.  Good peripheral circulation. Respiratory: Somewhat decreased respiratory effort with rattling in his throat.  Lungs slightly rhonchorous Gastrointestinal: Soft nontender Musculoskeletal: No lower extremity edema   Neurologic: The patient localizes all 4 extremities although decreased strength throughout.  She actually has rigidity and cogwheeling in all 4 extremities almost as if he had Parkinson's disease.  I am unable to appreciate any deep tendon reflexes 2/5 strength in lower extremities. 3/5 strength in uppers Unable to assess sensation Skin:  Skin is warm, dry and intact. No rash noted. Psychiatric: Encephalopathic  ____________________________________________   DIFFERENTIAL includes but not limited to  Urinary tract infection, sepsis, intracerebral hemorrhage, stroke, periodic paralysis, [place] ____________________________________________   LABS (all labs ordered are listed, but only abnormal results are displayed)  Labs Reviewed  COMPREHENSIVE METABOLIC PANEL - Abnormal; Notable for the following components:      Result Value   Potassium 2.8 (*)    Glucose, Bld 164 (*)    Calcium 8.3 (*)    Albumin 3.4 (*)    Alkaline Phosphatase 141 (*)    All other components within normal limits  CBC WITH DIFFERENTIAL/PLATELET - Abnormal; Notable for the following components:   Hemoglobin 12.7 (*)    HCT 38.5 (*)     All other components within normal limits  URINALYSIS, COMPLETE (UACMP) WITH MICROSCOPIC - Abnormal; Notable for the following components:   Color, Urine AMBER (*)    APPearance CLEAR (*)    Protein, ur 30 (*)  All other components within normal limits  GLUCOSE, CAPILLARY - Abnormal; Notable for the following components:   Glucose-Capillary 159 (*)    All other components within normal limits  BASIC METABOLIC PANEL - Abnormal; Notable for the following components:   Potassium 2.6 (*)    Glucose, Bld 121 (*)    Calcium 8.1 (*)    All other components within normal limits  CBC - Abnormal; Notable for the following components:   RBC 4.06 (*)    Hemoglobin 11.9 (*)    HCT 37.0 (*)    All other components within normal limits  URINE CULTURE  TROPONIN I  BRAIN NATRIURETIC PEPTIDE  URINE DRUG SCREEN, QUALITATIVE ([facility] ONLY)  TSH  T4, FREE  RPR  INFLUENZA PANEL BY PCR (TYPE A & B)  HIV ANTIBODY (ROUTINE TESTING W REFLEX)    Lab work reviewed by me with significant hypokalemia which could be the reason for the patient's weakness __________________________________________  EKG  ED ECG REPORT I, Darel Hong, the attending physician, personally viewed and interpreted this ECG.  Date: 07/23/2018 EKG Time:  Rate: 83 Rhythm: normal sinus rhythm QRS Axis: normal Intervals: normal ST/T Wave abnormalities: normal Narrative Interpretation: no evidence of acute ischemia  ____________________________________________  RADIOLOGY  Head CT reviewed by me with no acute disease Chest x-ray reviewed by me concerning for possible multilobar pneumonia ____________________________________________   PROCEDURES  Procedure(s) performed: no  .Critical Care Performed by: Darel Hong, MD Authorized by: Darel Hong, MD   Critical care provider statement:    Critical care time (minutes):  30   Critical care time was exclusive of:  Separately billable procedures and treating  other patients   Critical care was necessary to treat or prevent imminent or life-threatening deterioration of the following conditions:  Metabolic crisis and respiratory failure   Critical care was time spent personally by me on the following activities:  Development of treatment plan with patient or surrogate, discussions with consultants, evaluation of patient's response to treatment, examination of patient, obtaining history from patient or surrogate, ordering and performing treatments and interventions, ordering and review of laboratory studies, ordering and review of radiographic studies, pulse oximetry, re-evaluation of patient's condition and review of old charts    Critical Care performed: yes  ____________________________________________   INITIAL IMPRESSION / [place] / ED COURSE  Pertinent labs & imaging results that were available during my care of the patient were reviewed by me and considered in my medical decision making (see chart for details).   As part of my medical decision making, I reviewed the following data within the [place] History obtained from family if available, nursing notes, old chart and ekg, as well as notes from prior ED visits.  The patient comes to the emergency department with severely decreased strength and rigidity and cogwheeling in all 4 extremities.  He is clearly not able to eat or drink on his own as he seems to be nearly aspirating even his saliva.  Core temperature is normal and repeat blood sugar unremarkable as well.  Differential is extremely broad and includes infectious versus metabolic versus neurological.  Head CT, chest x-ray, urinalysis, and broad blood work including TSH free T4 pending.  The patient's head CT is unremarkable however his chest x-ray is concerning for multilobar pneumonia.  Urinalysis is negative.  Of note his potassium is quite low and it is possible that he could have periodic paralysis  contributing to his weakness.  All bolus magnesium  and give aggressive IV potassium repletion as he is unable to tolerate oral on his own.  Also cover him with antibiotics for community-acquired pneumonia.  At this point given his constellation of symptoms including his metabolic abnormality and acute infection he requires inpatient admission for IV potassium, IV magnesium, IV antibiotics, and continued monitoring.  Family verbalizes understanding and agreement with the plan.  I then discussed with hospitalist who has graciously agreed to admit the patient to his service.      ____________________________________________   FINAL CLINICAL IMPRESSION(S) / ED DIAGNOSES  Final diagnoses:  Hypokalemia  Community acquired pneumonia of right upper lobe of lung ([place])  Paralysis ([place])      NEW MEDICATIONS STARTED DURING THIS VISIT:  Current Discharge Medication List       Note:  This document was prepared using Dragon voice recognition software and may include unintentional dictation errors.     Darel Hong, MD 07/23/18 [phone]

## 2018-07-22 NOTE — ED Notes (Signed)
Pt refuses flu swab.

## 2018-07-22 NOTE — ED Triage Notes (Addendum)
Pt arrived from home via EMS with complaints of weakness. EMS states that he was completely fine two days ago and has gradually been getting worse.  Pt has Hx of dementia and diabetes. VS per EMS BP-145/109 HR-84 O2sat-96%RA Temp-98.9 BS-185. Wife is on the way to the hospital. EMS stated a Negative Stroke Screen Wife stated that his urine was a golden brown with odor. Pt alert. Hx of stroke and heart stents.

## 2018-07-23 ENCOUNTER — Other Ambulatory Visit: Payer: Self-pay

## 2018-07-23 ENCOUNTER — Inpatient Hospital Stay: Payer: PPO

## 2018-07-23 DIAGNOSIS — R1310 Dysphagia, unspecified: Secondary | ICD-10-CM | POA: Diagnosis not present

## 2018-07-23 DIAGNOSIS — K8681 Exocrine pancreatic insufficiency: Secondary | ICD-10-CM | POA: Diagnosis not present

## 2018-07-23 DIAGNOSIS — S0990XA Unspecified injury of head, initial encounter: Secondary | ICD-10-CM | POA: Diagnosis not present

## 2018-07-23 DIAGNOSIS — K9423 Gastrostomy malfunction: Secondary | ICD-10-CM | POA: Diagnosis not present

## 2018-07-23 DIAGNOSIS — A419 Sepsis, unspecified organism: Secondary | ICD-10-CM | POA: Diagnosis not present

## 2018-07-23 DIAGNOSIS — Z4682 Encounter for fitting and adjustment of non-vascular catheter: Secondary | ICD-10-CM | POA: Diagnosis not present

## 2018-07-23 DIAGNOSIS — E11610 Type 2 diabetes mellitus with diabetic neuropathic arthropathy: Secondary | ICD-10-CM | POA: Diagnosis not present

## 2018-07-23 DIAGNOSIS — R05 Cough: Secondary | ICD-10-CM | POA: Diagnosis not present

## 2018-07-23 DIAGNOSIS — R918 Other nonspecific abnormal finding of lung field: Secondary | ICD-10-CM | POA: Diagnosis not present

## 2018-07-23 DIAGNOSIS — F0390 Unspecified dementia without behavioral disturbance: Secondary | ICD-10-CM | POA: Diagnosis not present

## 2018-07-23 DIAGNOSIS — F0281 Dementia in other diseases classified elsewhere with behavioral disturbance: Secondary | ICD-10-CM | POA: Diagnosis not present

## 2018-07-23 DIAGNOSIS — J690 Pneumonitis due to inhalation of food and vomit: Secondary | ICD-10-CM | POA: Diagnosis not present

## 2018-07-23 DIAGNOSIS — R0902 Hypoxemia: Secondary | ICD-10-CM | POA: Diagnosis not present

## 2018-07-23 DIAGNOSIS — Y95 Nosocomial condition: Secondary | ICD-10-CM | POA: Diagnosis present

## 2018-07-23 DIAGNOSIS — Z7401 Bed confinement status: Secondary | ICD-10-CM | POA: Diagnosis not present

## 2018-07-23 DIAGNOSIS — G300 Alzheimer's disease with early onset: Secondary | ICD-10-CM | POA: Diagnosis not present

## 2018-07-23 DIAGNOSIS — R41841 Cognitive communication deficit: Secondary | ICD-10-CM | POA: Diagnosis not present

## 2018-07-23 DIAGNOSIS — I10 Essential (primary) hypertension: Secondary | ICD-10-CM | POA: Diagnosis not present

## 2018-07-23 DIAGNOSIS — Y92230 Patient room in hospital as the place of occurrence of the external cause: Secondary | ICD-10-CM | POA: Diagnosis not present

## 2018-07-23 DIAGNOSIS — J189 Pneumonia, unspecified organism: Secondary | ICD-10-CM | POA: Diagnosis not present

## 2018-07-23 DIAGNOSIS — R402344 Coma scale, best motor response, flexion withdrawal, 24 hours or more after hospital admission: Secondary | ICD-10-CM | POA: Diagnosis not present

## 2018-07-23 DIAGNOSIS — J181 Lobar pneumonia, unspecified organism: Secondary | ICD-10-CM | POA: Diagnosis not present

## 2018-07-23 DIAGNOSIS — Z9911 Dependence on respirator [ventilator] status: Secondary | ICD-10-CM | POA: Diagnosis not present

## 2018-07-23 DIAGNOSIS — R498 Other voice and resonance disorders: Secondary | ICD-10-CM | POA: Diagnosis not present

## 2018-07-23 DIAGNOSIS — F0391 Unspecified dementia with behavioral disturbance: Secondary | ICD-10-CM | POA: Diagnosis not present

## 2018-07-23 DIAGNOSIS — J90 Pleural effusion, not elsewhere classified: Secondary | ICD-10-CM | POA: Diagnosis not present

## 2018-07-23 DIAGNOSIS — R29898 Other symptoms and signs involving the musculoskeletal system: Secondary | ICD-10-CM | POA: Diagnosis not present

## 2018-07-23 DIAGNOSIS — K861 Other chronic pancreatitis: Secondary | ICD-10-CM | POA: Diagnosis not present

## 2018-07-23 DIAGNOSIS — G9341 Metabolic encephalopathy: Secondary | ICD-10-CM | POA: Diagnosis not present

## 2018-07-23 DIAGNOSIS — J9811 Atelectasis: Secondary | ICD-10-CM | POA: Diagnosis not present

## 2018-07-23 DIAGNOSIS — E876 Hypokalemia: Secondary | ICD-10-CM | POA: Diagnosis not present

## 2018-07-23 DIAGNOSIS — G92 Toxic encephalopathy: Secondary | ICD-10-CM | POA: Diagnosis not present

## 2018-07-23 DIAGNOSIS — R011 Cardiac murmur, unspecified: Secondary | ICD-10-CM | POA: Diagnosis not present

## 2018-07-23 DIAGNOSIS — J9601 Acute respiratory failure with hypoxia: Secondary | ICD-10-CM | POA: Diagnosis not present

## 2018-07-23 DIAGNOSIS — G9340 Encephalopathy, unspecified: Secondary | ICD-10-CM | POA: Diagnosis not present

## 2018-07-23 DIAGNOSIS — R410 Disorientation, unspecified: Secondary | ICD-10-CM | POA: Diagnosis not present

## 2018-07-23 DIAGNOSIS — M2550 Pain in unspecified joint: Secondary | ICD-10-CM | POA: Diagnosis not present

## 2018-07-23 DIAGNOSIS — M6281 Muscle weakness (generalized): Secondary | ICD-10-CM | POA: Diagnosis not present

## 2018-07-23 DIAGNOSIS — G458 Other transient cerebral ischemic attacks and related syndromes: Secondary | ICD-10-CM | POA: Diagnosis not present

## 2018-07-23 DIAGNOSIS — Z7189 Other specified counseling: Secondary | ICD-10-CM | POA: Diagnosis not present

## 2018-07-23 DIAGNOSIS — G301 Alzheimer's disease with late onset: Secondary | ICD-10-CM | POA: Diagnosis not present

## 2018-07-23 DIAGNOSIS — I130 Hypertensive heart and chronic kidney disease with heart failure and stage 1 through stage 4 chronic kidney disease, or unspecified chronic kidney disease: Secondary | ICD-10-CM | POA: Diagnosis not present

## 2018-07-23 DIAGNOSIS — E871 Hypo-osmolality and hyponatremia: Secondary | ICD-10-CM | POA: Diagnosis not present

## 2018-07-23 DIAGNOSIS — J9600 Acute respiratory failure, unspecified whether with hypoxia or hypercapnia: Secondary | ICD-10-CM | POA: Diagnosis not present

## 2018-07-23 DIAGNOSIS — I69322 Dysarthria following cerebral infarction: Secondary | ICD-10-CM | POA: Diagnosis not present

## 2018-07-23 DIAGNOSIS — R4020 Unspecified coma: Secondary | ICD-10-CM | POA: Diagnosis not present

## 2018-07-23 DIAGNOSIS — N189 Chronic kidney disease, unspecified: Secondary | ICD-10-CM | POA: Diagnosis not present

## 2018-07-23 DIAGNOSIS — E1122 Type 2 diabetes mellitus with diabetic chronic kidney disease: Secondary | ICD-10-CM | POA: Diagnosis not present

## 2018-07-23 DIAGNOSIS — I69991 Dysphagia following unspecified cerebrovascular disease: Secondary | ICD-10-CM | POA: Diagnosis not present

## 2018-07-23 DIAGNOSIS — Z431 Encounter for attention to gastrostomy: Secondary | ICD-10-CM | POA: Diagnosis not present

## 2018-07-23 DIAGNOSIS — B004 Herpesviral encephalitis: Secondary | ICD-10-CM | POA: Diagnosis not present

## 2018-07-23 DIAGNOSIS — E569 Vitamin deficiency, unspecified: Secondary | ICD-10-CM | POA: Diagnosis not present

## 2018-07-23 DIAGNOSIS — G039 Meningitis, unspecified: Secondary | ICD-10-CM | POA: Diagnosis not present

## 2018-07-23 DIAGNOSIS — F05 Delirium due to known physiological condition: Secondary | ICD-10-CM | POA: Diagnosis not present

## 2018-07-23 DIAGNOSIS — R633 Feeding difficulties: Secondary | ICD-10-CM | POA: Diagnosis not present

## 2018-07-23 DIAGNOSIS — F0280 Dementia in other diseases classified elsewhere without behavioral disturbance: Secondary | ICD-10-CM | POA: Diagnosis not present

## 2018-07-23 DIAGNOSIS — J9621 Acute and chronic respiratory failure with hypoxia: Secondary | ICD-10-CM | POA: Diagnosis not present

## 2018-07-23 DIAGNOSIS — R1900 Intra-abdominal and pelvic swelling, mass and lump, unspecified site: Secondary | ICD-10-CM | POA: Diagnosis not present

## 2018-07-23 DIAGNOSIS — R404 Transient alteration of awareness: Secondary | ICD-10-CM | POA: Diagnosis not present

## 2018-07-23 DIAGNOSIS — R278 Other lack of coordination: Secondary | ICD-10-CM | POA: Diagnosis not present

## 2018-07-23 DIAGNOSIS — E119 Type 2 diabetes mellitus without complications: Secondary | ICD-10-CM | POA: Diagnosis not present

## 2018-07-23 DIAGNOSIS — Z8619 Personal history of other infectious and parasitic diseases: Secondary | ICD-10-CM | POA: Diagnosis not present

## 2018-07-23 DIAGNOSIS — R262 Difficulty in walking, not elsewhere classified: Secondary | ICD-10-CM | POA: Diagnosis not present

## 2018-07-23 DIAGNOSIS — R4182 Altered mental status, unspecified: Secondary | ICD-10-CM | POA: Diagnosis not present

## 2018-07-23 DIAGNOSIS — N170 Acute kidney failure with tubular necrosis: Secondary | ICD-10-CM | POA: Diagnosis not present

## 2018-07-23 DIAGNOSIS — Z794 Long term (current) use of insulin: Secondary | ICD-10-CM | POA: Diagnosis not present

## 2018-07-23 DIAGNOSIS — R64 Cachexia: Secondary | ICD-10-CM | POA: Diagnosis not present

## 2018-07-23 DIAGNOSIS — I5032 Chronic diastolic (congestive) heart failure: Secondary | ICD-10-CM | POA: Diagnosis not present

## 2018-07-23 DIAGNOSIS — E46 Unspecified protein-calorie malnutrition: Secondary | ICD-10-CM | POA: Diagnosis not present

## 2018-07-23 DIAGNOSIS — L89151 Pressure ulcer of sacral region, stage 1: Secondary | ICD-10-CM | POA: Diagnosis not present

## 2018-07-23 DIAGNOSIS — W06XXXA Fall from bed, initial encounter: Secondary | ICD-10-CM | POA: Diagnosis not present

## 2018-07-23 DIAGNOSIS — J9622 Acute and chronic respiratory failure with hypercapnia: Secondary | ICD-10-CM | POA: Diagnosis not present

## 2018-07-23 DIAGNOSIS — E1165 Type 2 diabetes mellitus with hyperglycemia: Secondary | ICD-10-CM | POA: Diagnosis not present

## 2018-07-23 DIAGNOSIS — G309 Alzheimer's disease, unspecified: Secondary | ICD-10-CM | POA: Diagnosis not present

## 2018-07-23 DIAGNOSIS — I509 Heart failure, unspecified: Secondary | ICD-10-CM | POA: Diagnosis not present

## 2018-07-23 DIAGNOSIS — R402214 Coma scale, best verbal response, none, 24 hours or more after hospital admission: Secondary | ICD-10-CM | POA: Diagnosis not present

## 2018-07-23 DIAGNOSIS — R52 Pain, unspecified: Secondary | ICD-10-CM | POA: Diagnosis not present

## 2018-07-23 DIAGNOSIS — Z434 Encounter for attention to other artificial openings of digestive tract: Secondary | ICD-10-CM | POA: Diagnosis not present

## 2018-07-23 DIAGNOSIS — K5909 Other constipation: Secondary | ICD-10-CM | POA: Diagnosis not present

## 2018-07-23 DIAGNOSIS — Z515 Encounter for palliative care: Secondary | ICD-10-CM | POA: Diagnosis not present

## 2018-07-23 DIAGNOSIS — E870 Hyperosmolality and hypernatremia: Secondary | ICD-10-CM | POA: Diagnosis not present

## 2018-07-23 DIAGNOSIS — K219 Gastro-esophageal reflux disease without esophagitis: Secondary | ICD-10-CM | POA: Diagnosis not present

## 2018-07-23 LAB — BLOOD GAS, ARTERIAL
ACID-BASE EXCESS: 4.6 mmol/L — AB (ref 0.0–2.0)
Bicarbonate: 28.3 mmol/L — ABNORMAL HIGH (ref 20.0–28.0)
FIO2: 0.21
O2 Saturation: 96.7 %
Patient temperature: 37
pCO2 arterial: 38 mmHg (ref 32.0–48.0)
pH, Arterial: 7.48 — ABNORMAL HIGH (ref 7.350–7.450)
pO2, Arterial: 81 mmHg — ABNORMAL LOW (ref 83.0–108.0)

## 2018-07-23 LAB — BASIC METABOLIC PANEL
Anion gap: 8 (ref 5–15)
BUN: 10 mg/dL (ref 8–23)
CALCIUM: 8.1 mg/dL — AB (ref 8.9–10.3)
CO2: 29 mmol/L (ref 22–32)
Chloride: 104 mmol/L (ref 98–111)
Creatinine, Ser: 0.69 mg/dL (ref 0.61–1.24)
GFR calc non Af Amer: >60 mL/min (ref >60)
Glucose, Bld: 121 mg/dL — ABNORMAL HIGH (ref 70–99)
Potassium: 2.6 mmol/L — CL (ref 3.5–5.1)
SODIUM: 141 mmol/L (ref 135–145)

## 2018-07-23 LAB — TSH: TSH: 0.464 u[IU]/mL (ref 0.350–4.500)

## 2018-07-23 LAB — GLUCOSE, CAPILLARY
GLUCOSE-CAPILLARY: 123 mg/dL — AB (ref 70–99)
Glucose-Capillary: 136 mg/dL — ABNORMAL HIGH (ref 70–99)
Glucose-Capillary: 136 mg/dL — ABNORMAL HIGH (ref 70–99)
Glucose-Capillary: 125 mg/dL — ABNORMAL HIGH (ref 70–99)

## 2018-07-23 LAB — CBC
HCT: 37 % — ABNORMAL LOW (ref 39.0–52.0)
Hemoglobin: 11.9 g/dL — ABNORMAL LOW (ref 13.0–17.0)
MCH: 29.3 pg (ref 26.0–34.0)
MCHC: 32.2 g/dL (ref 30.0–36.0)
MCV: 91.1 fL (ref 80.0–100.0)
NRBC: 0 % (ref 0.0–0.2)
Platelets: 298 10*3/uL (ref 150–400)
RBC: 4.06 MIL/uL — ABNORMAL LOW (ref 4.22–5.81)
RDW: 13.9 % (ref 11.5–15.5)
WBC: 10.2 10*3/uL (ref 4.0–10.5)

## 2018-07-23 LAB — AMMONIA: Ammonia: 38 umol/L — ABNORMAL HIGH (ref 9–35)

## 2018-07-23 LAB — MAGNESIUM: Magnesium: 2 mg/dL (ref 1.7–2.4)

## 2018-07-23 LAB — T4, FREE: Free T4: 1.17 ng/dL (ref 0.82–1.77)

## 2018-07-23 LAB — POTASSIUM: Potassium: 2.8 mmol/L — ABNORMAL LOW (ref 3.5–5.1)

## 2018-07-23 MED ORDER — METOPROLOL SUCCINATE ER 50 MG PO TB24: 50.0000 mg | ORAL_TABLET | Freq: Every day | ORAL | Status: DC

## 2018-07-23 MED ORDER — SODIUM CHLORIDE 0.9 % IV SOLN
500.0000 mg | INTRAVENOUS | Status: AC
  Administered 2018-07-23 – 2018-07-25 (×3): 500 mg via INTRAVENOUS
  Filled 2018-07-23 (×3): qty 500

## 2018-07-23 MED ORDER — ACETAMINOPHEN 650 MG RE SUPP
650.0000 mg | Freq: Four times a day (QID) | RECTAL | Status: DC | PRN
  Administered 2018-07-23 – 2018-07-29 (×2): 650 mg via RECTAL
  Filled 2018-07-23 (×3): qty 1

## 2018-07-23 MED ORDER — BENZONATATE 100 MG PO CAPS: 200.0000 mg | ORAL_CAPSULE | Freq: Three times a day (TID) | ORAL | Status: DC | PRN

## 2018-07-23 MED ORDER — POTASSIUM CHLORIDE 10 MEQ/100ML IV SOLN
10.0000 meq | INTRAVENOUS | Status: AC
  Administered 2018-07-23 (×4): 10 meq via INTRAVENOUS
  Filled 2018-07-23 (×4): qty 100

## 2018-07-23 MED ORDER — PANTOPRAZOLE SODIUM 40 MG PO TBEC
40.0000 mg | DELAYED_RELEASE_TABLET | Freq: Every day | ORAL | Status: DC
  Administered 2018-07-23: 40 mg via ORAL
  Filled 2018-07-23 (×2): qty 1

## 2018-07-23 MED ORDER — LAMOTRIGINE 25 MG PO TABS
100.0000 mg | ORAL_TABLET | Freq: Two times a day (BID) | ORAL | Status: DC
  Administered 2018-07-23 – 2018-08-02 (×8): 100 mg via ORAL
  Filled 2018-07-23 (×8): qty 4

## 2018-07-23 MED ORDER — GUAIFENESIN-DM 100-10 MG/5ML PO SYRP
5.0000 mL | ORAL_SOLUTION | ORAL | Status: DC | PRN
  Filled 2018-07-23: qty 5

## 2018-07-23 MED ORDER — MEMANTINE HCL 10 MG PO TABS
10.0000 mg | ORAL_TABLET | Freq: Two times a day (BID) | ORAL | Status: DC
  Administered 2018-07-23 – 2018-08-11 (×21): 10 mg via ORAL
  Filled 2018-07-23 (×14): qty 1
  Filled 2018-07-23 (×2): qty 2
  Filled 2018-07-23 (×4): qty 1
  Filled 2018-07-23 (×4): qty 2
  Filled 2018-07-23 (×3): qty 1

## 2018-07-23 MED ORDER — ONDANSETRON HCL 4 MG/2ML IJ SOLN
4.0000 mg | Freq: Four times a day (QID) | INTRAMUSCULAR | Status: DC | PRN
  Administered 2018-08-24: 4 mg via INTRAVENOUS
  Filled 2018-07-23: qty 2

## 2018-07-23 MED ORDER — LIDOCAINE 5 % EX PTCH
1.0000 | MEDICATED_PATCH | CUTANEOUS | Status: DC
  Administered 2018-07-23 – 2018-08-13 (×21): 1 via TRANSDERMAL
  Filled 2018-07-23 (×23): qty 1

## 2018-07-23 MED ORDER — ACETAMINOPHEN 325 MG PO TABS
650.0000 mg | ORAL_TABLET | Freq: Four times a day (QID) | ORAL | Status: DC | PRN
  Administered 2018-07-31 – 2018-08-10 (×3): 650 mg via ORAL
  Filled 2018-07-23 (×3): qty 2

## 2018-07-23 MED ORDER — ONDANSETRON HCL 4 MG PO TABS: 4.0000 mg | ORAL_TABLET | Freq: Four times a day (QID) | ORAL | Status: DC | PRN

## 2018-07-23 MED ORDER — ASPIRIN EC 81 MG PO TBEC: 81.0000 mg | DELAYED_RELEASE_TABLET | Freq: Every day | ORAL | Status: DC

## 2018-07-23 MED ORDER — DONEPEZIL HCL 5 MG PO TABS
10.0000 mg | ORAL_TABLET | Freq: Two times a day (BID) | ORAL | Status: DC
  Administered 2018-07-30 – 2018-08-11 (×20): 10 mg via ORAL
  Filled 2018-07-23 (×40): qty 2

## 2018-07-23 MED ORDER — POTASSIUM CHLORIDE IN NACL 20-0.9 MEQ/L-% IV SOLN
INTRAVENOUS | Status: DC
  Administered 2018-07-23 – 2018-08-01 (×17): via INTRAVENOUS
  Filled 2018-07-23 (×21): qty 1000

## 2018-07-23 MED ORDER — ENOXAPARIN SODIUM 40 MG/0.4ML ~~LOC~~ SOLN
40.0000 mg | SUBCUTANEOUS | Status: DC
  Administered 2018-07-23 – 2018-07-24 (×2): 40 mg via SUBCUTANEOUS
  Filled 2018-07-23 (×2): qty 0.4

## 2018-07-23 MED ORDER — INSULIN GLARGINE 100 UNIT/ML ~~LOC~~ SOLN
36.0000 [IU] | Freq: Every day | SUBCUTANEOUS | Status: DC
  Filled 2018-07-23 (×3): qty 0.36

## 2018-07-23 MED ORDER — POTASSIUM CHLORIDE CRYS ER 20 MEQ PO TBCR
40.0000 meq | EXTENDED_RELEASE_TABLET | ORAL | Status: AC
  Administered 2018-07-23: 40 meq via ORAL
  Filled 2018-07-23 (×2): qty 2

## 2018-07-23 MED ORDER — SODIUM CHLORIDE 0.9 % IV SOLN
INTRAVENOUS | Status: DC
  Administered 2018-07-23: 02:00:00 via INTRAVENOUS

## 2018-07-23 MED ORDER — SODIUM CHLORIDE 0.9 % IV SOLN
1.0000 g | INTRAVENOUS | Status: DC
  Administered 2018-07-23 – 2018-07-25 (×3): 1 g via INTRAVENOUS
  Filled 2018-07-23 (×2): qty 1
  Filled 2018-07-23: qty 10

## 2018-07-23 MED ORDER — INSULIN ASPART 100 UNIT/ML ~~LOC~~ SOLN
0.0000 [IU] | Freq: Three times a day (TID) | SUBCUTANEOUS | Status: DC
  Administered 2018-07-24: 1 [IU] via SUBCUTANEOUS
  Administered 2018-07-25: 2 [IU] via SUBCUTANEOUS
  Administered 2018-07-25: 1 [IU] via SUBCUTANEOUS
  Administered 2018-07-26 – 2018-07-27 (×2): 2 [IU] via SUBCUTANEOUS
  Administered 2018-07-27: 1 [IU] via SUBCUTANEOUS
  Filled 2018-07-23 (×7): qty 1

## 2018-07-23 MED ORDER — CLOPIDOGREL BISULFATE 75 MG PO TABS: 75.0000 mg | ORAL_TABLET | Freq: Every day | ORAL | Status: DC

## 2018-07-23 MED ORDER — LACTULOSE 10 GM/15ML PO SOLN
20.0000 g | Freq: Every day | ORAL | Status: DC
  Administered 2018-07-30 – 2018-07-31 (×2): 20 g via ORAL
  Filled 2018-07-23 (×2): qty 30

## 2018-07-23 NOTE — ED Notes (Signed)
Pt refusing flu swab and medications

## 2018-07-23 NOTE — Progress Notes (Signed)
Pt very drowsy and not alert. MD Kalisetti at bedside and notified. MD placing lab orders

## 2018-07-23 NOTE — Progress Notes (Signed)
Pt has temp 101.5. PRN Tylenol suppository given. MD [facility] paged and notified.

## 2018-07-23 NOTE — H&P (Signed)
[facility] [place] NAME: Ajit Errico    MR#:  [id number]  DATE OF BIRTH:  April 30, 1956  DATE OF ADMISSION:  07/22/2018  PRIMARY CARE PHYSICIAN: [name], MD   REQUESTING/REFERRING PHYSICIAN: Mable Paris, MD  CHIEF COMPLAINT:  Cough  HISTORY OF PRESENT ILLNESS:  Trevaun Rendleman  is a 62 y.o. male who presents with chief complaint as above.  Patient presents with confusion, weakness, poor appetite for the past several days.  He has had some cough.  He is unable to contribute much information to his history, history is provided by family at bedside.  Work-up here in the ED today shows pneumonia.  Hospitalist were called for admission  PAST MEDICAL HISTORY:   Past Medical History:  Diagnosis Date  . Acute MI ([place])   . Arthritis   . Back pain   . CAD (coronary artery disease)   . CHF (congestive heart failure) ([place])   . Chronic kidney disease   . Degenerative lumbar disc   . Dementia ([place])   . Diabetes mellitus without complication ([place])   . GERD (gastroesophageal reflux disease)   . Hernia of abdominal cavity   . Hyperlipemia   . Hypertension   . Malignant intraductal papillary mucinous tumor of pancreas ([place])   . Memory loss   . Pancreatitis   . Seizures ([place])    staring spells  . Shingles   . Stroke ([place]) 08/2017  . TIA (transient ischemic attack)      PAST SURGICAL HISTORY:   Past Surgical History:  Procedure Laterality Date  . CARDIAC CATHETERIZATION    . CORONARY ANGIOPLASTY    . CYSTOSCOPY WITH STENT PLACEMENT Right 05/13/2017   Procedure: CYSTOSCOPY WITH STENT PLACEMENT;  Surgeon: Nickie Retort, MD;  Location: [facility] ORS;  Service: Urology;  Laterality: Right;  . ERCP N/A 09/12/2015   Procedure: ENDOSCOPIC RETROGRADE CHOLANGIOPANCREATOGRAPHY (ERCP);  Surgeon: Hulen Luster, MD;  Location: [facility] ENDOSCOPY;  Service: Gastroenterology;  Laterality: N/A;  . ERCP N/A 01/02/2016   Procedure: ENDOSCOPIC  RETROGRADE CHOLANGIOPANCREATOGRAPHY (ERCP);  Surgeon: Hulen Luster, MD;  Location: [facility] ENDOSCOPY;  Service: Gastroenterology;  Laterality: N/A;  . EXTERNAL FIXATION WRIST FRACTURE Left   . left arm metal plate    . LEFT HEART CATH AND CORONARY ANGIOGRAPHY Left 11/04/2016   Procedure: Left Heart Cath and Coronary Angiography;  Surgeon: Isaias Cowman, MD;  Location: [facility] CV LAB;  Service: Cardiovascular;  Laterality: Left;  . Left shoulder surgery    . Stent times 3  2013   Cardiac  . TOTAL ELBOW REPLACEMENT Left   . VASECTOMY    . VENTRICULOPERITONEAL SHUNT       SOCIAL HISTORY:   Social History   Tobacco Use  . Smoking status: Former Smoker    Last attempt to quit: 2013    Years since quitting: 6.9  . Smokeless tobacco: Never Used  . Tobacco comment: used to smoke 2PD for 40 yrs, quit about 4 years ago  Substance Use Topics  . Alcohol use: No    Alcohol/week: 0.0 standard drinks    Comment: occasional     FAMILY HISTORY:   Family History  Problem Relation Age of Onset  . Diabetes Mother   . Hypertension Mother   . CAD Mother   . Hyperlipidemia Mother   . Stroke Mother   . ALS Mother   . Alzheimer's disease Father   . Diabetes Father   . Heart Problems  Brother      DRUG ALLERGIES:   Allergies  Allergen Reactions  . Hydrocodone Anaphylaxis  . Morphine Other (See Comments)    Loss of memory  . Ambien [Zolpidem] Other (See Comments)    delirium    . Brilinta [Ticagrelor] Other (See Comments)    Stroke   . Flexeril [Cyclobenzaprine] Other (See Comments)    delerium   . Flunitrazepam Other (See Comments)    ROHYPNOL (hallucinations)  . Haldol [Haloperidol Lactate] Other (See Comments)    delerium   . Levetiracetam Diarrhea and Other (See Comments)    Unable to walk  . Lorazepam Hives  . Risperdal [Risperidone] Other (See Comments)    Delirium   . Trazodone Other (See Comments)    Delirium Can take in low doses   . Benadryl  [Diphenhydramine Hcl (Sleep)] Rash  . Penicillins Rash    Mouth ulcers Has patient had a PCN reaction causing immediate rash, facial/tongue/throat swelling, SOB or lightheadedness with hypotension: Yes Has patient had a PCN reaction causing severe rash involving mucus membranes or skin necrosis: No Has patient had a PCN reaction that required hospitalization No Has patient had a PCN reaction occurring within the last 10 years: Yes If all of the above answers are "NO", then may proceed with Cephalosporin use.    MEDICATIONS AT HOME:   Prior to Admission medications   Medication Sig Start Date End Date Taking? Authorizing Provider  acetaminophen (TYLENOL) 500 MG tablet Take 500 mg by mouth 3 (three) times daily.    [provider]  aspirin EC 81 MG tablet Take 81 mg by mouth daily.    [provider]  atorvastatin (LIPITOR) 80 MG tablet Take 80 mg by mouth daily.    [provider]  botulinum toxin Type A (BOTOX) 100 units SOLR injection Inject 100 Units into the muscle every 3 (three) months.    [provider]  cholecalciferol (VITAMIN D) 1000 units tablet Take 1,000 Units by mouth daily.    [provider]  clopidogrel (PLAVIX) 75 MG tablet Take 75 mg by mouth daily.     [provider]  colestipol (COLESTID) 1 g tablet Take 2 g by mouth daily at 12 noon.    [provider]  diphenoxylate-atropine (LOMOTIL) 2.5-0.025 MG tablet Take 1 tablet by mouth 3 (three) times daily as needed for diarrhea or loose stools.    [provider]  donepezil (ARICEPT) 10 MG tablet Take 1 tablet (10 mg total) by mouth 2 (two) times daily. 01/09/18   [place] Givens, NP  furosemide (LASIX) 40 MG tablet Take 40 mg by mouth at bedtime.    [provider]  gabapentin (NEURONTIN) 100 MG capsule TAKE 2 CAPSULES BY MOUTH EVERY MORNING AND 3 CAPSULES EVERY NIGHT AT BEDTIME 05/18/18   Marcial Pacas, MD  insulin NPH Human (HUMULIN N,NOVOLIN N)  100 UNIT/ML injection Inject 0.25 mLs (25 Units total) into the skin 2 (two) times daily. Patient taking differently: Inject 30 Units into the skin 2 (two) times daily.  04/05/16   Hillary Bow, MD  insulin regular (NOVOLIN R,HUMULIN R) 100 units/mL injection Inject 0.3 mLs (30 Units total) into the skin 3 (three) times daily with meals. Patient taking differently: Inject 34 Units into the skin 3 (three) times daily before meals. 34 units subcutaneous 3 times daily with meals with an additional sliding scale of 8 units added with every 20 glucose reading over 200. 04/05/16   Hillary Bow, MD  isosorbide mononitrate (IMDUR) 30 MG 24 hr tablet Take 30 mg by mouth daily.  10/07/16   [provider]  lamoTRIgine (LAMICTAL) 100 MG tablet Take 100 mg by mouth 2 (two) times daily.    [provider]  lidocaine (LIDODERM) 5 % Place 1 patch onto the skin daily as needed (pain). Remove & Discard patch within 12 hours or as directed by MD (APPLIED TO PATIENT BACK FOR PAIN)    [provider]  losartan (COZAAR) 100 MG tablet Take 100 mg by mouth daily.    [provider]  memantine (NAMENDA) 10 MG tablet Take 1 tablet (10 mg total) by mouth 2 (two) times daily. 01/09/18   [place] Givens, NP  metoprolol succinate (TOPROL-XL) 50 MG 24 hr tablet Take 50 mg by mouth daily. Take with or immediately following a meal.     [provider]  nitroGLYCERIN (NITROSTAT) 0.4 MG SL tablet Place 1 tablet under the tongue every 5 (five) minutes as needed for chest pain.     [provider]  Pancrelipase, Lip-Prot-Amyl, (ZENPEP) 25000 units CPEP Take 50,000 Units by mouth 3 (three) times daily with meals.    [provider]  pantoprazole (PROTONIX) 40 MG tablet Take 40 mg by mouth at bedtime.  10/04/16   [provider]  potassium chloride (K-DUR) 10 MEQ tablet Take 10 mEq by mouth 2 (two) times daily. 07/14/18   [provider]  QUEtiapine (SEROQUEL)  25 MG tablet Take 2 tablets (50 mg total) by mouth at bedtime. 05/18/18   Marcial Pacas, MD    REVIEW OF SYSTEMS:  Review of Systems  Unable to perform ROS: Acuity of condition     VITAL SIGNS:   Vitals:   07/22/18 2229 07/22/18 2230 07/22/18 2231 07/22/18 2330  BP: (!) 158/106 (!) 152/79  111/74  Pulse: 89     Resp: 15 17    Temp: 98.4 F (36.9 C)     TempSrc: Oral     SpO2: 99%     Weight:   77.6 kg   Height:   5\' 8"  (1.727 m)    Wt Readings from Last 3 Encounters:  07/22/18 77.6 kg  05/18/18 77.6 kg  02/08/18 79.6 kg    PHYSICAL EXAMINATION:  Physical Exam  Vitals reviewed. Constitutional: He is oriented to person, place, and time. He appears well-developed and well-nourished. No distress.  HENT:  Head: Normocephalic and atraumatic.  Mouth/Throat: Oropharynx is clear and moist.  Eyes: Pupils are equal, round, and reactive to light. Conjunctivae and EOM are normal. No scleral icterus.  Neck: Normal range of motion. Neck supple. No JVD present. No thyromegaly present.  Cardiovascular: Normal rate, regular rhythm and intact distal pulses. Exam reveals no gallop and no friction rub.  No murmur heard. Respiratory: Effort normal. No respiratory distress. He has no wheezes. He has no rales.  Coarse breath sounds  GI: Soft. Bowel sounds are normal. He exhibits no distension. There is no tenderness.  Musculoskeletal: Normal range of motion. He exhibits no edema.  No arthritis, no gout  Lymphadenopathy:    He has no cervical adenopathy.  Neurological: He is alert and oriented to person, place, and time. No cranial nerve deficit.  No dysarthria, no aphasia  Skin: Skin is warm and dry. No rash noted. No erythema.  Psychiatric: He has a normal mood and affect. His behavior is normal. Judgment and thought content normal.    LABORATORY PANEL:   CBC Recent Labs  Lab  07/22/18 2239  WBC 10.5  HGB 12.7*  HCT 38.5*  PLT 286    ------------------------------------------------------------------------------------------------------------------  Chemistries  Recent Labs  Lab 07/22/18 2239  NA 140  K 2.8*  CL 102  CO2 29  GLUCOSE 164*  BUN 11  CREATININE 0.76  CALCIUM 8.3*  AST 21  ALT 13  ALKPHOS 141*  BILITOT 0.7   ------------------------------------------------------------------------------------------------------------------  Cardiac Enzymes Recent Labs  Lab 07/22/18 2239  TROPONINI <0.03   ------------------------------------------------------------------------------------------------------------------  RADIOLOGY:  Ct Head Wo Contrast  Result Date: 07/22/2018 CLINICAL DATA:  Pt arrived from home via EMS with complaints of weakness. EMS states that he was completely fine two days ago and has gradually been getting worse. Pt has Hx of dementia and diabetes. EMS stated a Negative Stroke Screen. Hx of stroke and heart stents. EXAM: CT HEAD WITHOUT CONTRAST TECHNIQUE: Contiguous axial images were obtained from the base of the skull through the vertex without intravenous contrast. COMPARISON:  04/14/2017 FINDINGS: Brain: No evidence of acute infarction, hemorrhage, hydrocephalus, extra-axial collection or mass lesion/mass effect. Mild ventricular and sulcal enlargement is noted consistent with mild generalized atrophy. Periventricular white matter hypoattenuation is also present consistent minor chronic microvascular ischemic change. Vascular: No hyperdense vessel or unexpected calcification. Skull: Normal. Negative for fracture or focal lesion. Sinuses/Orbits: Globes and orbits are unremarkable. Visualized sinuses and mastoid air cells are clear. Other: None. IMPRESSION: 1. No acute intracranial abnormalities. 2. Mild atrophy and minor chronic microvascular ischemic change. Stable appearance from the prior study Electronically Signed   By: Lajean Manes M.D.   On: 07/22/2018 23:34   Dg Chest Port 1  View  Result Date: 07/22/2018 CLINICAL DATA:  Weakness. EXAM: PORTABLE CHEST 1 VIEW COMPARISON:  December 12, 2017 FINDINGS: No pneumothorax. The heart, hila, and mediastinum are normal. Mild opacity in the left base and right upper lung. No other acute abnormalities. IMPRESSION: Suspected subtle infiltrate in the right upper lung and left base. Recommend follow-up to resolution. Electronically Signed   By: Dorise Bullion III M.D   On: 07/22/2018 23:42    EKG:   Orders placed or performed during the hospital encounter of 07/22/18  . EKG 12-Lead  . EKG 12-Lead  . EKG 12-Lead  . EKG 12-Lead  . ED EKG  . ED EKG    IMPRESSION AND PLAN:  Principal Problem:   CAP (community acquired pneumonia) -IV antibiotics, PRN antitussive, PRN DuoNeb Active Problems:   Uncontrolled type 2 diabetes mellitus with hyperglycemia, with long-term current use of insulin (HCC) -carb modified diet with sliding scale insulin coverage   Essential hypertension -continue home dose antihypertensives   Alzheimer's dementia ([place]) -continue home meds   GERD (gastroesophageal reflux disease) - continue home dose PPI  Chart review performed and case discussed with ED provider. Labs, imaging and/or ECG reviewed by provider and discussed with patient/family. Management plans discussed with the patient and/or family.  DVT PROPHYLAXIS: SubQ lovenox   GI PROPHYLAXIS:  PPI   ADMISSION STATUS: Inpatient     CODE STATUS: Full Code Status History    Date Active Date Inactive Code Status Order ID Comments User Context   04/05/2016 0304 04/05/2016 1851 Full Code [id number]  Harvie [place], DO Inpatient   10/19/2015 0103 10/20/2015 1102 Full Code [id number]  Harrie Foreman, MD Inpatient   08/21/2015 1912 08/23/2015 1657 Full Code [id number]  Gladstone Lighter, MD Inpatient   02/19/2015 1627 02/20/2015 2054 Full Code [id number]  Henreitta Leber, MD Inpatient      TOTAL  TIME TAKING CARE OF THIS PATIENT: 45 minutes.    Averleigh Savary [place] 07/23/2018, 12:00 AM  [facility]  Office  [phone]  CC: Primary care physician; [name], MD  Note:  This document was prepared using Dragon voice recognition software and may include unintentional dictation errors.

## 2018-07-23 NOTE — Progress Notes (Signed)
Spoke with Dr. Marcille Blanco pt diabetic and needing insulin ordered for tomorrow. Pt also not able to tolerate iv potassium wanting to know if we can do by mouth. Potassium 2.6. MD to place orders.

## 2018-07-23 NOTE — ED Notes (Signed)
Transporting patient to rm 150

## 2018-07-23 NOTE — Progress Notes (Signed)
Pt is very drowsy and not alert enough to take medications by mouth. MD [facility] notified and aware.

## 2018-07-23 NOTE — Progress Notes (Signed)
[place] at [place] NAME: Maurice Patterson    MR#:  [id number]  DATE OF BIRTH:  [birth year]  SUBJECTIVE:  CHIEF COMPLAINT:  No chief complaint on file.  - very lethargic this AM - vitals stable  REVIEW OF SYSTEMS:  Review of Systems  Unable to perform ROS: Mental status change    DRUG ALLERGIES:   Allergies  Allergen Reactions  . Hydrocodone Anaphylaxis  . Morphine Other (See Comments)    Loss of memory  . Ambien [Zolpidem] Other (See Comments)    delirium    . Brilinta [Ticagrelor] Other (See Comments)    Stroke   . Flexeril [Cyclobenzaprine] Other (See Comments)    delerium   . Flunitrazepam Other (See Comments)    ROHYPNOL (hallucinations)  . Haldol [Haloperidol Lactate] Other (See Comments)    delerium   . Levetiracetam Diarrhea and Other (See Comments)    Unable to walk  . Lorazepam Hives  . Risperdal [Risperidone] Other (See Comments)    Delirium   . Trazodone Other (See Comments)    Delirium Can take in low doses   . Benadryl [Diphenhydramine Hcl (Sleep)] Rash  . Penicillins Rash    Mouth ulcers Has patient had a PCN reaction causing immediate rash, facial/tongue/throat swelling, SOB or lightheadedness with hypotension: Yes Has patient had a PCN reaction causing severe rash involving mucus membranes or skin necrosis: No Has patient had a PCN reaction that required hospitalization No Has patient had a PCN reaction occurring within the last 10 years: Yes If all of the above answers are "NO", then may proceed with Cephalosporin use.    VITALS:  Blood pressure 140/70, pulse 82, temperature 98.6 F (37 C), temperature source Oral, resp. rate 19, height 5\' 8"  (1.727 m), weight 71.6 kg, SpO2 98 %.  PHYSICAL EXAMINATION:  Physical Exam  GENERAL:  62 y.o.-year-old patient lying in the bed with no acute distress.  EYES: Pupils equal, round, reactive to light and accommodation. No scleral icterus. Extraocular  muscles intact.  HEENT: Head atraumatic, normocephalic. Oropharynx and nasopharynx clear.  NECK:  Supple, no jugular venous distention. No thyroid enlargement, no tenderness.  LUNGS: Normal breath sounds bilaterally, no wheezing, rales,rhonchi or crepitation. No use of accessory muscles of respiration. Decreased bibasilar breath sounds CARDIOVASCULAR: S1, S2 normal. No murmurs, rubs, or gallops.  ABDOMEN: Soft, nontender, nondistended. Bowel sounds present. No organomegaly or mass.  EXTREMITIES: No pedal edema, cyanosis, or clubbing.  NEUROLOGIC:  No obvious facial droop- lethargic, stiffer left upper extremity, opening eyes to tactile stimulation and speaking- but incomprehensible words PSYCHIATRIC: The patient is very lethargic SKIN: No obvious rash, lesion, or ulcer.    LABORATORY PANEL:   CBC Recent Labs  Lab 07/23/18 0206  WBC 10.2  HGB 11.9*  HCT 37.0*  PLT 298   ------------------------------------------------------------------------------------------------------------------  Chemistries  Recent Labs  Lab 07/22/18 2239 07/23/18 0206  NA 140 141  K 2.8* 2.6*  CL 102 104  CO2 29 29  GLUCOSE 164* 121*  BUN 11 10  CREATININE 0.76 0.69  CALCIUM 8.3* 8.1*  AST 21  --   ALT 13  --   ALKPHOS 141*  --   BILITOT 0.7  --    ------------------------------------------------------------------------------------------------------------------  Cardiac Enzymes Recent Labs  Lab 07/22/18 2239  TROPONINI <0.03   ------------------------------------------------------------------------------------------------------------------  RADIOLOGY:  Ct Head Wo Contrast  Result Date: 07/22/2018 CLINICAL DATA:  Pt arrived from home via EMS with complaints of weakness. EMS states  that he was completely fine two days ago and has gradually been getting worse. Pt has Hx of dementia and diabetes. EMS stated a Negative Stroke Screen. Hx of stroke and heart stents. EXAM: CT HEAD WITHOUT  CONTRAST TECHNIQUE: Contiguous axial images were obtained from the base of the skull through the vertex without intravenous contrast. COMPARISON:  04/14/2017 FINDINGS: Brain: No evidence of acute infarction, hemorrhage, hydrocephalus, extra-axial collection or mass lesion/mass effect. Mild ventricular and sulcal enlargement is noted consistent with mild generalized atrophy. Periventricular white matter hypoattenuation is also present consistent minor chronic microvascular ischemic change. Vascular: No hyperdense vessel or unexpected calcification. Skull: Normal. Negative for fracture or focal lesion. Sinuses/Orbits: Globes and orbits are unremarkable. Visualized sinuses and mastoid air cells are clear. Other: None. IMPRESSION: 1. No acute intracranial abnormalities. 2. Mild atrophy and minor chronic microvascular ischemic change. Stable appearance from the prior study Electronically Signed   By: Lajean Manes M.D.   On: 07/22/2018 23:34   Dg Chest Port 1 View  Result Date: 07/22/2018 CLINICAL DATA:  Weakness. EXAM: PORTABLE CHEST 1 VIEW COMPARISON:  December 12, 2017 FINDINGS: No pneumothorax. The heart, hila, and mediastinum are normal. Mild opacity in the left base and right upper lung. No other acute abnormalities. IMPRESSION: Suspected subtle infiltrate in the right upper lung and left base. Recommend follow-up to resolution. Electronically Signed   By: Dorise Bullion III M.D   On: 07/22/2018 23:42    EKG:   Orders placed or performed during the hospital encounter of 07/22/18  . EKG 12-Lead  . EKG 12-Lead  . EKG 12-Lead  . EKG 12-Lead  . ED EKG  . ED EKG    ASSESSMENT AND PLAN:   62 year old male with past medical history significant for CAD, CHF, CKD, dementia, diabetes, hypertension and seizure disorder presents to hospital secondary to worsening weakness and poor appetite  1.  Community-acquired pneumonia-unfortunately blood cultures not drawn on admission.  Ordered for now -On Rocephin  and azithromycin -Also cough meds as needed -Remains on room air  2.  Acute metabolic encephalopathy-CT of the head is negative.  Check ABG and also ammonia levels -If does not improve will consider MRI of the brain given his history of strokes -Sugars are within normal limits  3.  Hypokalemia-being replaced orally and IV.  Recheck potassium level later today -Check magnesium level   4.  Diabetes mellitus-on Lantus and sliding scale insulin.  If remains lethargic and unable to eat-we will decrease the dose of Lantus tonight.  5.  History of seizure disorder-on Lamictal  6.  DVT prophylaxis-on Lovenox  Physical therapy consult when more stable    All the records are reviewed and case discussed with Care Management/Social Workerr. Management plans discussed with the patient, family and they are in agreement.  CODE STATUS: Full Code  TOTAL TIME TAKING CARE OF THIS PATIENT: 38 minutes.   POSSIBLE D/C IN 2 DAYS, DEPENDING ON CLINICAL CONDITION.   Gladstone Lighter M.D on 07/23/2018 at 9:44 AM  Between 7am to 6pm - Pager - [phone]  After 6pm go to [email or web address] - password [email or web address]  Office  [phone]  CC: Primary care physician; [name], MD

## 2018-07-24 ENCOUNTER — Inpatient Hospital Stay: Payer: PPO

## 2018-07-24 LAB — GLUCOSE, CAPILLARY
GLUCOSE-CAPILLARY: 149 mg/dL — AB (ref 70–99)
Glucose-Capillary: 146 mg/dL — ABNORMAL HIGH (ref 70–99)
Glucose-Capillary: 94 mg/dL (ref 70–99)
Glucose-Capillary: 124 mg/dL — ABNORMAL HIGH (ref 70–99)
Glucose-Capillary: 120 mg/dL — ABNORMAL HIGH (ref 70–99)

## 2018-07-24 LAB — BASIC METABOLIC PANEL
Anion gap: 13 (ref 5–15)
BUN: 6 mg/dL — ABNORMAL LOW (ref 8–23)
CHLORIDE: 102 mmol/L (ref 98–111)
CO2: 24 mmol/L (ref 22–32)
Calcium: 8.3 mg/dL — ABNORMAL LOW (ref 8.9–10.3)
Creatinine, Ser: 0.67 mg/dL (ref 0.61–1.24)
GFR calc Af Amer: >60 mL/min (ref >60)
GFR calc non Af Amer: >60 mL/min (ref >60)
Glucose, Bld: 154 mg/dL — ABNORMAL HIGH (ref 70–99)
POTASSIUM: 3.2 mmol/L — AB (ref 3.5–5.1)
Sodium: 139 mmol/L (ref 135–145)

## 2018-07-24 LAB — CBC
HCT: 38.3 % — ABNORMAL LOW (ref 39.0–52.0)
Hemoglobin: 12.4 g/dL — ABNORMAL LOW (ref 13.0–17.0)
MCH: 29.2 pg (ref 26.0–34.0)
MCHC: 32.4 g/dL (ref 30.0–36.0)
MCV: 90.3 fL (ref 80.0–100.0)
Platelets: 300 10*3/uL (ref 150–400)
RBC: 4.24 MIL/uL (ref 4.22–5.81)
RDW: 13.7 % (ref 11.5–15.5)
WBC: 10.7 10*3/uL — ABNORMAL HIGH (ref 4.0–10.5)
nRBC: 0 % (ref 0.0–0.2)

## 2018-07-24 LAB — URINE CULTURE: Culture: NO GROWTH

## 2018-07-24 LAB — INFLUENZA PANEL BY PCR (TYPE A & B)
Influenza A By PCR: NEGATIVE
Influenza B By PCR: NEGATIVE

## 2018-07-24 MED ORDER — INSULIN GLARGINE 100 UNIT/ML ~~LOC~~ SOLN
10.0000 [IU] | Freq: Every day | SUBCUTANEOUS | Status: DC
  Administered 2018-07-25 – 2018-07-29 (×3): 10 [IU] via SUBCUTANEOUS
  Filled 2018-07-24 (×7): qty 0.1

## 2018-07-24 MED ORDER — POTASSIUM CHLORIDE 10 MEQ/100ML IV SOLN
10.0000 meq | INTRAVENOUS | Status: AC
  Administered 2018-07-24 (×4): 10 meq via INTRAVENOUS
  Filled 2018-07-24 (×3): qty 100

## 2018-07-24 MED ORDER — LACTULOSE ENEMA
300.0000 mL | Freq: Once | ORAL | Status: AC
  Administered 2018-07-24: 300 mL via RECTAL
  Filled 2018-07-24: qty 300

## 2018-07-24 NOTE — Progress Notes (Signed)
Pt still very somnolent, only arousable via sternal rub. Started an IV on patient and he didn't flinch. All oral meds and food being held for aspiration precautions. Patient's vitals stable and no evidence of pain or respiratory distress.

## 2018-07-24 NOTE — Progress Notes (Signed)
Patient bed alarm went off. Patient found lying on floor sideways beside bed with feet rested on mattress. Patient alert and able to answer some questions. Verbalized he did not hit head and not in pain but unable to tell what he was attempting to do. MD notified. Wife notified. CT scan completed. Patient placed in low bed with mats. Patient resting on and off for remainder of night.

## 2018-07-24 NOTE — Progress Notes (Signed)
[name] NAME: Maurice Patterson    MR#:  [id number]  DATE OF BIRTH:  1956/07/06  SUBJECTIVE:  CHIEF COMPLAINT:  No chief complaint on file.  - remains lethargic - had a fall from bed early this AM - fever last evening  REVIEW OF SYSTEMS:  Review of Systems  Unable to perform ROS: Mental status change    DRUG ALLERGIES:   Allergies  Allergen Reactions  . Hydrocodone Anaphylaxis  . Morphine Other (See Comments)    Loss of memory  . Ambien [Zolpidem] Other (See Comments)    delirium    . Brilinta [Ticagrelor] Other (See Comments)    Stroke   . Flexeril [Cyclobenzaprine] Other (See Comments)    delerium   . Flunitrazepam Other (See Comments)    ROHYPNOL (hallucinations)  . Haldol [Haloperidol Lactate] Other (See Comments)    delerium   . Levetiracetam Diarrhea and Other (See Comments)    Unable to walk  . Lorazepam Hives  . Risperdal [Risperidone] Other (See Comments)    Delirium   . Trazodone Other (See Comments)    Delirium Can take in low doses   . Benadryl [Diphenhydramine Hcl (Sleep)] Rash  . Penicillins Rash    Mouth ulcers Has patient had a PCN reaction causing immediate rash, facial/tongue/throat swelling, SOB or lightheadedness with hypotension: Yes Has patient had a PCN reaction causing severe rash involving mucus membranes or skin necrosis: No Has patient had a PCN reaction that required hospitalization No Has patient had a PCN reaction occurring within the last 10 years: Yes If all of the above answers are "NO", then may proceed with Cephalosporin use.    VITALS:  Blood pressure 139/72, pulse 89, temperature 98 F (36.7 C), temperature source Oral, resp. rate 19, height 5\' 8"  (1.727 m), weight 71.6 kg, SpO2 98 %.  PHYSICAL EXAMINATION:  Physical Exam  GENERAL:  62 y.o.-year-old patient lying in the bed with no acute distress.  EYES: Pupils equal, round, reactive to light and  accommodation. No scleral icterus. Extraocular muscles intact.  HEENT: Head atraumatic, normocephalic. Oropharynx and nasopharynx clear. Dry mucus membranes NECK:  Supple, no jugular venous distention. No thyroid enlargement, no tenderness.  LUNGS: Normal breath sounds bilaterally, no wheezing, rales,rhonchi or crepitation. No use of accessory muscles of respiration. Decreased bibasilar breath sounds CARDIOVASCULAR: S1, S2 normal. No murmurs, rubs, or gallops.  ABDOMEN: Soft, nontender, nondistended. Bowel sounds present. No organomegaly or mass.  EXTREMITIES: No pedal edema, cyanosis, or clubbing.  NEUROLOGIC:  No obvious facial droop- lethargic, stiffer left upper extremity, opening eyes to tactile stimulation and speaking- but incomprehensible words PSYCHIATRIC: The patient is very lethargic SKIN: No obvious rash, lesion, or ulcer.    LABORATORY PANEL:   CBC Recent Labs  Lab 07/24/18 0805  WBC 10.7*  HGB 12.4*  HCT 38.3*  PLT 300   ------------------------------------------------------------------------------------------------------------------  Chemistries  Recent Labs  Lab 07/22/18 2239  07/23/18 1544 07/24/18 0805  NA 140   < >  --  139  K 2.8*   < > 2.8* 3.2*  CL 102   < >  --  102  CO2 29   < >  --  24  GLUCOSE 164*   < >  --  154*  BUN 11   < >  --  6*  CREATININE 0.76   < >  --  0.67  CALCIUM 8.3*   < >  --  8.3*  MG  --   --  2.0  --   AST 21  --   --   --   ALT 13  --   --   --   ALKPHOS 141*  --   --   --   BILITOT 0.7  --   --   --    < > = values in this interval not displayed.   ------------------------------------------------------------------------------------------------------------------  Cardiac Enzymes Recent Labs  Lab 07/22/18 2239  TROPONINI <0.03   ------------------------------------------------------------------------------------------------------------------  RADIOLOGY:  Ct Head Wo Contrast  Result Date: 07/24/2018 CLINICAL DATA:   Initial evaluation for acute trauma, fall, found down. EXAM: CT HEAD WITHOUT CONTRAST TECHNIQUE: Contiguous axial images were obtained from the base of the skull through the vertex without intravenous contrast. COMPARISON:  Prior CT from 07/22/2018. FINDINGS: Brain: Generalized cerebral atrophy with moderate chronic small vessel ischemic disease, stable. No acute intracranial hemorrhage. No acute large vessel territory infarct. No mass lesion, midline shift or mass effect. No hydrocephalus. No extra-axial fluid collection. Vascular: No hyperdense vessel. Calcified atherosclerosis at the skull base. Skull: Scalp soft tissues demonstrate no acute finding. Calvarium intact. Sinuses/Orbits: Globes orbital soft tissues within normal limits. Paranasal sinuses are clear. Trace right mastoid effusion noted, likely chronic. Mastoid air cells otherwise clear. Other: None. IMPRESSION: 1. Stable appearance of the head with no acute intracranial abnormality identified. 2. Atrophy with chronic microvascular ischemic disease. Electronically Signed   By: Jeannine Boga M.D.   On: 07/24/2018 03:27   Ct Head Wo Contrast  Result Date: 07/22/2018 CLINICAL DATA:  Pt arrived from home via EMS with complaints of weakness. EMS states that he was completely fine two days ago and has gradually been getting worse. Pt has Hx of dementia and diabetes. EMS stated a Negative Stroke Screen. Hx of stroke and heart stents. EXAM: CT HEAD WITHOUT CONTRAST TECHNIQUE: Contiguous axial images were obtained from the base of the skull through the vertex without intravenous contrast. COMPARISON:  04/14/2017 FINDINGS: Brain: No evidence of acute infarction, hemorrhage, hydrocephalus, extra-axial collection or mass lesion/mass effect. Mild ventricular and sulcal enlargement is noted consistent with mild generalized atrophy. Periventricular white matter hypoattenuation is also present consistent minor chronic microvascular ischemic change. Vascular:  No hyperdense vessel or unexpected calcification. Skull: Normal. Negative for fracture or focal lesion. Sinuses/Orbits: Globes and orbits are unremarkable. Visualized sinuses and mastoid air cells are clear. Other: None. IMPRESSION: 1. No acute intracranial abnormalities. 2. Mild atrophy and minor chronic microvascular ischemic change. Stable appearance from the prior study Electronically Signed   By: Lajean Manes M.D.   On: 07/22/2018 23:34   Mr Brain Wo Contrast  Result Date: 07/23/2018 CLINICAL DATA:  Altered level of consciousness. Progressive lethargy. EXAM: MRI HEAD WITHOUT CONTRAST TECHNIQUE: Multiplanar, multiecho pulse sequences of the brain and surrounding structures were obtained without intravenous contrast. COMPARISON:  Head CT 07/22/2018 and MRI 02/20/2015 FINDINGS: The patient was unable to tolerate the complete examination. Axial and coronal diffusion, sagittal T1, and axial FLAIR sequences were obtained and are moderately motion degraded. Brain: There is no evidence of acute infarct. Cerebral atrophy is mildly advanced for age. Scattered cerebral white matter T2 hyperintensities are grossly similar to the prior MRI and nonspecific but compatible with mild chronic small vessel ischemic disease. Vascular: Not evaluated on this limited study. Skull and upper cervical spine: No destructive calvarial lesion. Sinuses/Orbits: Grossly clear sinuses. No gross orbital abnormality. Other: None. IMPRESSION: 1. Motion degraded, incomplete examination. 2. No acute infarct. 3. Mild  chronic small vessel ischemic disease and cerebral atrophy. Electronically Signed   By: Logan Bores M.D.   On: 07/23/2018 14:57   Dg Chest Port 1 View  Result Date: 07/22/2018 CLINICAL DATA:  Weakness. EXAM: PORTABLE CHEST 1 VIEW COMPARISON:  December 12, 2017 FINDINGS: No pneumothorax. The heart, hila, and mediastinum are normal. Mild opacity in the left base and right upper lung. No other acute abnormalities. IMPRESSION:  Suspected subtle infiltrate in the right upper lung and left base. Recommend follow-up to resolution. Electronically Signed   By: Dorise Bullion III M.D   On: 07/22/2018 23:42    EKG:   Orders placed or performed during the hospital encounter of 07/22/18  . EKG 12-Lead  . EKG 12-Lead  . EKG 12-Lead  . EKG 12-Lead  . ED EKG  . ED EKG    ASSESSMENT AND PLAN:   62 year old male with past medical history significant for CAD, CHF, CKD, dementia, diabetes, hypertension and seizure disorder presents to hospital secondary to worsening weakness and poor appetite  1.  Community-acquired pneumonia-  - blood cultures pending, spiked fever yesterday.  No neck stiffness. -On Rocephin and azithromycin -Remains on room air  2.  Acute metabolic encephalopathy-CT of the head is negative.  ABG with no CO2 retention.  Ammonia is borderline elevated at 39. -MRI of the brain ordered since no improvement noted.  Did not show any acute findings.  Has mild to moderate cerebral atrophy. -No history of seizures.  Sugars are within normal limits. -Started lactulose enema today.  Neurology consult ordered since no improvement noted. 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  Has not received Plavix in the last couple of days.  3.  Hypokalemia-being replaced IV as patient unable to take oral medications.  4.  Diabetes mellitus-on Lantus and sliding scale insulin.  If remains lethargic and unable to eat-we will decrease the dose of Lantus tonight.  5.  History of seizure disorder/myoclonic jerks-on Lamictal -According to wife, patient does not have any history of seizures  6.  DVT prophylaxis-on Lovenox  Physical therapy consult when more stable Updated wife today   All the records are reviewed and case discussed with Care Management/Social Workerr. Management plans discussed with the patient, family and they are in agreement.  CODE STATUS: Full  Code  TOTAL TIME TAKING CARE OF THIS PATIENT: 41 minutes.   POSSIBLE D/C IN 2 DAYS, DEPENDING ON CLINICAL CONDITION.   Gladstone Lighter M.D on 07/24/2018 at 12:54 PM  Between 7am to 6pm - Pager - [phone]  After 6pm go to [email or web address] - password [email or web address] [facility]  Office  [phone]  CC: Primary care physician; [name], MD

## 2018-07-24 NOTE — Progress Notes (Signed)
All oral meds have had to be held today due to patient's somnolence and lethargy. Arousable only to sternal rub or turning the patient's brief.   Son was here mid-afternoon and said he's seen his dad like this many times in the last 7 years. He tried different ways to get his dad to talk to him, but all the sounds the patient made were unintelligable.   I spoke to the patient's wife on the phone this morning and relayed the info to the doctor.

## 2018-07-25 ENCOUNTER — Inpatient Hospital Stay: Payer: PPO

## 2018-07-25 ENCOUNTER — Telehealth: Payer: Self-pay | Admitting: Neurology

## 2018-07-25 DIAGNOSIS — Z515 Encounter for palliative care: Secondary | ICD-10-CM

## 2018-07-25 DIAGNOSIS — R0902 Hypoxemia: Secondary | ICD-10-CM

## 2018-07-25 DIAGNOSIS — Z7189 Other specified counseling: Secondary | ICD-10-CM

## 2018-07-25 DIAGNOSIS — I10 Essential (primary) hypertension: Secondary | ICD-10-CM

## 2018-07-25 DIAGNOSIS — Z794 Long term (current) use of insulin: Secondary | ICD-10-CM

## 2018-07-25 DIAGNOSIS — R404 Transient alteration of awareness: Secondary | ICD-10-CM

## 2018-07-25 LAB — BASIC METABOLIC PANEL
Anion gap: 12 (ref 5–15)
BUN: 7 mg/dL — AB (ref 8–23)
CO2: 23 mmol/L (ref 22–32)
Calcium: 8.1 mg/dL — ABNORMAL LOW (ref 8.9–10.3)
Chloride: 102 mmol/L (ref 98–111)
Creatinine, Ser: 0.61 mg/dL (ref 0.61–1.24)
GFR calc Af Amer: >60 mL/min (ref >60)
GFR calc non Af Amer: >60 mL/min (ref >60)
Glucose, Bld: 145 mg/dL — ABNORMAL HIGH (ref 70–99)
Potassium: 3.5 mmol/L (ref 3.5–5.1)
Sodium: 137 mmol/L (ref 135–145)

## 2018-07-25 LAB — GLUCOSE, CAPILLARY
Glucose-Capillary: 139 mg/dL — ABNORMAL HIGH (ref 70–99)
Glucose-Capillary: 170 mg/dL — ABNORMAL HIGH (ref 70–99)
Glucose-Capillary: 141 mg/dL — ABNORMAL HIGH (ref 70–99)
Glucose-Capillary: 153 mg/dL — ABNORMAL HIGH (ref 70–99)

## 2018-07-25 LAB — RPR: RPR Ser Ql: NONREACTIVE

## 2018-07-25 LAB — HIV ANTIBODY (ROUTINE TESTING W REFLEX): HIV Screen 4th Generation wRfx: NONREACTIVE

## 2018-07-25 MED ORDER — SODIUM CHLORIDE 0.9 % IV SOLN
2.0000 g | INTRAVENOUS | Status: DC
  Administered 2018-07-25 – 2018-07-27 (×10): 2 g via INTRAVENOUS
  Filled 2018-07-25 (×3): qty 2000
  Filled 2018-07-25: qty 2
  Filled 2018-07-25: qty 2000
  Filled 2018-07-25: qty 2
  Filled 2018-07-25: qty 2000
  Filled 2018-07-25 (×3): qty 2
  Filled 2018-07-25 (×2): qty 2000
  Filled 2018-07-25: qty 2
  Filled 2018-07-25: qty 2000
  Filled 2018-07-25 (×2): qty 2
  Filled 2018-07-25 (×2): qty 2000

## 2018-07-25 MED ORDER — VANCOMYCIN HCL IN DEXTROSE 1-5 GM/200ML-% IV SOLN
1000.0000 mg | Freq: Two times a day (BID) | INTRAVENOUS | Status: DC
  Administered 2018-07-25 – 2018-07-27 (×3): 1000 mg via INTRAVENOUS
  Filled 2018-07-25 (×4): qty 200

## 2018-07-25 MED ORDER — ENOXAPARIN SODIUM 40 MG/0.4ML ~~LOC~~ SOLN: 40.0000 mg | SUBCUTANEOUS | Status: DC

## 2018-07-25 MED ORDER — DEXTROSE 5 % IV SOLN
700.0000 mg | Freq: Three times a day (TID) | INTRAVENOUS | Status: DC
  Administered 2018-07-25 – 2018-07-30 (×10): 700 mg via INTRAVENOUS
  Filled 2018-07-25 (×21): qty 14

## 2018-07-25 MED ORDER — SODIUM CHLORIDE 0.9 % IV SOLN
2.0000 g | Freq: Two times a day (BID) | INTRAVENOUS | Status: DC
  Administered 2018-07-25 – 2018-07-27 (×5): 2 g via INTRAVENOUS
  Filled 2018-07-25 (×3): qty 2
  Filled 2018-07-25 (×2): qty 20
  Filled 2018-07-25 (×2): qty 2

## 2018-07-25 MED ORDER — SODIUM CHLORIDE 0.9 % IV SOLN
400.0000 mg | Freq: Once | INTRAVENOUS | Status: AC
  Administered 2018-07-25: 400 mg via INTRAVENOUS
  Filled 2018-07-25: qty 40

## 2018-07-25 MED ORDER — SODIUM CHLORIDE 0.9 % IV SOLN
200.0000 mg | Freq: Two times a day (BID) | INTRAVENOUS | Status: AC
  Administered 2018-07-25 – 2018-08-09 (×29): 200 mg via INTRAVENOUS
  Filled 2018-07-25 (×46): qty 20

## 2018-07-25 MED ORDER — VANCOMYCIN HCL IN DEXTROSE 1-5 GM/200ML-% IV SOLN
1000.0000 mg | INTRAVENOUS | Status: AC
  Administered 2018-07-25: 1000 mg via INTRAVENOUS
  Filled 2018-07-25: qty 200

## 2018-07-25 NOTE — Progress Notes (Signed)
eeg completed ° °

## 2018-07-25 NOTE — Consult Note (Signed)
Consultation Note Date: 07/25/2018   Patient Name: Maurice Patterson  DOB: 02-01-1956  MRN: [id number]  Age / Sex: 62 y.o., male  PCP: [name], MD Referring Physician: Gladstone Lighter, MD  Reason for Consultation: Establishing goals of care  HPI/Patient Profile: 62 y.o. male admitted on 07/22/2018 from home with complaints of confusion, poor appetite, and generalized weakness. He has a past medical history of MI, arthritis, CAD, CHF, CKD, dementia, diabetes, GERD, HSV encephalitis, hyperlipidemia, hypertension, seizures, and CVA. Family provided history due to patient's altered mental status. During his ED course 2.8, glucose 164, BUN 11, creatinine 0.6, alk phos 141, albumin 3.4, BNP 40, troponin 0 0.03, CBC 10.5, hemoglobin 12.7, head CT showed no acute abnormalities, mild atrophy and minor chronic microvascular ischemic changes.  Chest x-ray showed septal infiltrate in the right upper lung and left base.  It was started on IV antibiotics and continued on home medications.  Admission patient continues to have altered mental status with generalized weakness and poor p.o. intake.  He continues on Rocephin and azithromycin.  Patient is is S/P lumbar puncture due to prior history of HSV encephalitis.  Palliative medicine team consulted for goals of care discussion  Clinical Assessment and Goals of Care: I have reviewed medical records including lab results, imaging, Epic notes, and MAR, received report from the bedside RN, and assessed the patient. I spoke with patient's wife, Kavaughn Faucett discuss diagnosis prognosis, [place], EOL wishes, disposition and options.  Patient is unable to engage in goals of care discussion due to altered mental status and lethargy.  I introduced Palliative Medicine as specialized medical care for people living with serious illness. It focuses on providing relief from the symptoms and  stress of a serious illness. The goal is to improve quality of life for both the patient and the family.  Patient's wife states prior to admission he was ambulatory, alert and oriented with some occasional confusion, and able to perform all ADLs with minimum assistance.  She states she works and patient was able to care for himself in the home without assistance.  Somewhat anxious during conversation with continue with statements that she is unaware of what it going on with her husband.  She states they have had multiple admissions at [facility] and is concerned as she was told being patient was declining due to dementia and mad cow disease.   Patient's son and brother-in-law who is a retired cardiologist has a different outlook on patient's condition.  They have stated to multiple medical providers that patient's condition (cognitively, nutritionally, and functionally) has been this way for some time and they have been asking wife to bring him to the hospital for care. They have stated to providers they were not surprised of the severity of his condition, although wife seems to think differently.   I attempted to discuss his current illness and what it means in the larger context of his on-going co-morbidities.  Natural disease trajectory and expectations at EOL were discussed. Wife verbalized appreciation of me spending  time discussing his test and current condition, however, she feels there is more to test and figure out. Wife expressed her frustration with patient not receiving his home medications or insulin. Spent detailed amount of time reviewing MAR with her and reassuring her patient is receiving insulin as ordered based on sliding scale and I also educated her that patient is lethargic and unable to take anything by mouth safely. She is aware this is why he has not received some of his medications.   I attempted to elicit values and goals of care important to the patient.    The difference between  aggressive medical intervention and comfort care was considered in light of the patient's goals of care. Wife request to continue with full aggressive medical interventions.   I attempted to discuss his current full code status with consideration to his current condition and co-morbidities. Wife verbalized she wishes for him to remain a full code, full aggressive interventions, and would like feeding tube, TPN or any other measures needed to provide him full aggressive support.   She verbalized this was all she was interested in discussing at this time, and there was no need to discuss home needs or support because he was far from being ready to discharge.   Questions and concerns were addressed.  The family was encouraged to call with questions or concerns.  PMT will continue to support holistically as needed.     Primary Decision Maker:  HCPOA/WIFE    SUMMARY OF RECOMMENDATIONS    Full Code  Full aggressive medical interventions, including PICC, TPN, or PEG if needed.   Wife not interested in discussing palliative or hospice needs at discharge. States her immediate concerns is figuring out what is going on with her husband. Dr. Tressia Miners aware of discussion.   PMT will continue to shadow patient for needs of re-engagment. Please call if needed.   Code Status/Advance Care Planning:  Full code  Palliative Prophylaxis:   Aspiration, Bowel Regimen, Delirium Protocol, Frequent Pain Assessment, Oral Care and Turn Reposition  Additional Recommendations (Limitations, Scope, Preferences):  Full Scope Treatment  Psycho-social/Spiritual:   Desire for further Chaplaincy support:NO   Additional Recommendations: Caregiving  Support/Resources and Education on Hospice  Prognosis:   Guarded to poor-in the setting of dementia, poor po intake, cachetic, protein calorie malnutrition, lethargy, immobility,  MI, arthritis, CAD, CHF, CKD, dementia, diabetes, GERD, HSV encephalitis,  hyperlipidemia, hypertension, seizures, and CVA.   Discharge Planning: To Be Determined      Primary Diagnoses: Present on Admission: . Essential hypertension . Alzheimer's dementia ([place]) . CAP (community acquired pneumonia) . GERD (gastroesophageal reflux disease)   I have reviewed the medical record, interviewed the patient and family, and examined the patient. The following aspects are pertinent.  Past Medical History:  Diagnosis Date  . Acute MI ([place])   . Arthritis   . Back pain   . CAD (coronary artery disease)   . CHF (congestive heart failure) ([place])   . Chronic kidney disease   . Degenerative lumbar disc   . Dementia ([place])   . Diabetes mellitus without complication ([place])   . GERD (gastroesophageal reflux disease)   . Hernia of abdominal cavity   . Hyperlipemia   . Hypertension   . Malignant intraductal papillary mucinous tumor of pancreas ([place])   . Memory loss   . Pancreatitis   . Seizures ([place])    staring spells  . Shingles   . Stroke ([place]) 08/2017  . TIA (transient ischemic  attack)    Social History   Socioeconomic History  . Marital status: Married    Spouse name: Not on file  . Number of children: 2  . Years of education: BS  . Highest education level: Not on file  Occupational History  . Occupation: Disabled  Social Needs  . Financial resource strain: Not on file  . Food insecurity:    Worry: Not on file    Inability: Not on file  . Transportation needs:    Medical: Not on file    Non-medical: Not on file  Tobacco Use  . Smoking status: Former Smoker    Last attempt to quit: 2013    Years since quitting: 6.9  . Smokeless tobacco: Never Used  . Tobacco comment: used to smoke 2PD for 40 yrs, quit about 4 years ago  Substance and Sexual Activity  . Alcohol use: No    Alcohol/week: 0.0 standard drinks    Comment: occasional  . Drug use: No  . Sexual activity: Not on file  Lifestyle  . Physical activity:    Days per week: Not on file     Minutes per session: Not on file  . Stress: Not on file  Relationships  . Social connections:    Talks on phone: Not on file    Gets together: Not on file    Attends religious service: Not on file    Active member of club or organization: Not on file    Attends meetings of clubs or organizations: Not on file    Relationship status: Not on file  Other Topics Concern  . Not on file  Social History Narrative   Lives at home with wife and son. Ambulatory at baseline.   Right-handed   Caffeine: 2 sodas per day   Family History  Problem Relation Age of Onset  . Diabetes Mother   . Hypertension Mother   . CAD Mother   . Hyperlipidemia Mother   . Stroke Mother   . ALS Mother   . Alzheimer's disease Father   . Diabetes Father   . Heart Problems Brother    Scheduled Meds: . aspirin EC  81 mg Oral Daily  . donepezil  10 mg Oral BID  . [START ON 07/26/2018] enoxaparin (LOVENOX) injection  40 mg Subcutaneous Q24H  . insulin aspart  0-9 Units Subcutaneous TID WC  . insulin glargine  10 Units Subcutaneous QHS  . lactulose  20 g Oral Daily  . lamoTRIgine  100 mg Oral BID  . lidocaine  1 patch Transdermal Q24H  . memantine  10 mg Oral BID  . metoprolol succinate  50 mg Oral Daily  . pantoprazole  40 mg Oral QHS   Continuous Infusions: . 0.9 % NaCl with KCl 20 mEq / L 75 mL/hr at 07/25/18 1611  . acyclovir 700 mg (07/25/18 1320)  . ampicillin (OMNIPEN) IV    . cefTRIAXone (ROCEPHIN)  IV 2 g (07/25/18 1507)  . lacosamide (VIMPAT) IV    . lacosamide (VIMPAT) IV 400 mg (07/25/18 1611)  . vancomycin     PRN Meds:.acetaminophen **OR** acetaminophen, benzonatate, guaiFENesin-dextromethorphan, ondansetron **OR** ondansetron (ZOFRAN) IV Medications Prior to Admission:  Prior to Admission medications   Medication Sig Start Date End Date Taking? Authorizing Provider  acetaminophen (TYLENOL) 500 MG tablet Take 500 mg by mouth 3 (three) times daily.   Yes [provider]  aspirin EC  81 MG tablet Take 81 mg by mouth daily.   Yes [provider]  botulinum toxin Type A (BOTOX) 100 units SOLR injection Inject 100 Units into the muscle every 3 (three) months.   Yes [provider]  cholecalciferol (VITAMIN D) 1000 units tablet Take 1,000 Units by mouth daily.    Yes [provider]  clopidogrel (PLAVIX) 75 MG tablet Take 75 mg by mouth daily.    Yes [provider]  colestipol (COLESTID) 1 g tablet Take 1 g by mouth daily.    Yes [provider]  diphenoxylate-atropine (LOMOTIL) 2.5-0.025 MG tablet Take 1 tablet by mouth 3 (three) times daily as needed for diarrhea or loose stools.   Yes [provider]  donepezil (ARICEPT) 10 MG tablet Take 1 tablet (10 mg total) by mouth 2 (two) times daily. 01/09/18  Yes Ward Givens, NP  furosemide (LASIX) 40 MG tablet Take 40 mg by mouth daily.    Yes [provider]  insulin NPH Human (HUMULIN N,NOVOLIN N) 100 UNIT/ML injection Inject 0.25 mLs (25 Units total) into the skin 2 (two) times daily. Patient taking differently: Inject 32 Units into the skin 2 (two) times daily.  04/05/16  Yes Sudini, Alveta Heimlich, MD  insulin regular (NOVOLIN R,HUMULIN R) 100 units/mL injection Inject 0.3 mLs (30 Units total) into the skin 3 (three) times daily with meals. Patient taking differently: Inject 34 Units into the skin 3 (three) times daily before meals. 34 units subcutaneous 3 times daily with meals with an additional sliding scale of 8 units added with every 20 glucose reading over 200. 04/05/16  Yes Sudini, Alveta Heimlich, MD  isosorbide mononitrate (IMDUR) 30 MG 24 hr tablet Take 30 mg by mouth daily.  10/07/16  Yes [provider]  lamoTRIgine (LAMICTAL) 100 MG tablet Take 100 mg by mouth 2 (two) times daily.   Yes [provider]  lidocaine (LIDODERM) 5 % Place 1 patch onto the skin daily as needed (pain). Remove & Discard patch within 12 hours or as directed by MD (APPLIED TO PATIENT  BACK FOR PAIN)   Yes [provider]  losartan (COZAAR) 100 MG tablet Take 100 mg by mouth daily.   Yes [provider]  memantine (NAMENDA) 10 MG tablet Take 1 tablet (10 mg total) by mouth 2 (two) times daily. 01/09/18  Yes Ward Givens, NP  metoprolol succinate (TOPROL-XL) 50 MG 24 hr tablet Take 50 mg by mouth daily. Take with or immediately following a meal.    Yes [provider]  pantoprazole (PROTONIX) 40 MG tablet Take 40 mg by mouth daily.  10/04/16  Yes [provider]  potassium chloride (K-DUR) 10 MEQ tablet Take 10 mEq by mouth 2 (two) times daily. 07/14/18  Yes [provider]  nitroGLYCERIN (NITROSTAT) 0.4 MG SL tablet Place 1 tablet under the tongue every 5 (five) minutes as needed for chest pain.     [provider]   Allergies  Allergen Reactions  . Hydrocodone Anaphylaxis  . Morphine Other (See Comments)    Loss of memory  . Ambien [Zolpidem] Other (See Comments)    delirium    . Brilinta [Ticagrelor] Other (See Comments)    Stroke   . Flexeril [Cyclobenzaprine] Other (See Comments)    delerium   . Flunitrazepam Other (See Comments)    ROHYPNOL (hallucinations)  . Haldol [Haloperidol Lactate] Other (See Comments)    delerium   . Levetiracetam Diarrhea and Other (See Comments)    Unable to walk  . Lorazepam Hives  . Risperdal [Risperidone] Other (See  Comments)    Delirium   . Trazodone Other (See Comments)    Delirium Can take in low doses   . Benadryl [Diphenhydramine Hcl (Sleep)] Rash  . Penicillins Rash    Mouth ulcers Has patient had a PCN reaction causing immediate rash, facial/tongue/throat swelling, SOB or lightheadedness with hypotension: Yes Has patient had a PCN reaction causing severe rash involving mucus membranes or skin necrosis: No Has patient had a PCN reaction that required hospitalization No Has patient had a PCN reaction occurring within the last 10 years: Yes If all of the above  answers are "NO", then may proceed with Cephalosporin use.   Review of Systems  Unable to perform ROS: Acuity of condition    Physical Exam  Constitutional: Vital signs are normal. He appears lethargic. He appears cachectic. He has a sickly appearance.  Thin, frail, chronically ill appearing   Cardiovascular: Regular rhythm and normal heart sounds. Tachycardia present. Exam reveals decreased pulses.  Pulmonary/Chest: Effort normal. He has decreased breath sounds.  Abdominal: Soft. Normal appearance. Bowel sounds are decreased.  Neurological: He appears lethargic. He is disoriented.  Skin: Skin is warm and dry. Bruising noted.  Psychiatric: Cognition and memory are impaired. He expresses inappropriate judgment.  Nursing note and vitals reviewed.   Vital Signs: BP (!) 143/80 (BP Location: Left Arm)   Pulse 99   Temp 98.4 F (36.9 C) (Oral)   Resp 18   Ht 5' 8" (1.727 m)   Wt 71.6 kg   SpO2 91%   BMI 23.99 kg/m  Pain Scale: 0-10   Pain Score: Asleep   SpO2: SpO2: 91 % O2 Device:SpO2: 91 % O2 Flow Rate: .   IO: Intake/output summary:   Intake/Output Summary (Last 24 hours) at 07/25/2018 1633 Last data filed at 07/25/2018 0900 Gross per 24 hour  Intake 4147.82 ml  Output -  Net 4147.82 ml    LBM: Last BM Date: 07/23/18 Baseline Weight: Weight: 77.6 kg Most recent weight: Weight: 71.6 kg     Palliative Assessment/Data: PPS 10%   Time In: 1300 Time Out: 1405 Time Total: 65 min  Greater than 50%  of this time was spent counseling and coordinating care related to the above assessment and plan.  Signed by:  Alda Lea, AGPCNP-BC Palliative Medicine Team  Phone: [phone] Fax: [phone] Pager: [phone] Amion: Bjorn Pippin    Please contact Palliative Medicine Team phone at [phone] for questions and concerns.  For individual provider: See Shea Evans

## 2018-07-25 NOTE — Progress Notes (Signed)
ELECTROENCEPHALOGRAM REPORT Date of Study: 07/25/18  MRN: [id number]   Clinical History:  62 y.o. male who presents with confusion, weakness, poor appetite for the past several days.  He has had some cough. Work-up today shows pneumonia.  Pt is on lamictal   Technical Summary:  A multichannel digital EEG recording measured by the international 10-20 system with electrodes applied with paste and impedances below 5000 ohms performed in our laboratory with EKG monitoring in an awake and asleep patient. Photic stimulation were performed. The digital EEG was referentially recorded, reformatted, and digitally filtered in a variety of bipolar and referential montages for optimal display.  Description: The patient is awake and asleep during the recording. During maximal wakefulness, there is a symmetric, medium voltage 10 Hz posterior dominant rhythm that attenuates with eye opening. The record is symmetric. During drowsiness and sleep, there is an increase in theta slowing of the background. Vertex waves and symmetric sleep spindles were seen. Photic stimulation did not elicit any abnormalities.   There were no electrographic seizures seen.  EKG lead was unremarkable  Impression: This awake and asleep EEG is abnormal due to mild generalized slowing but no electrographic seizures noted.

## 2018-07-25 NOTE — Consult Note (Addendum)
Reason for Consult: Altered mental status Referring Physician: Gladstone Lighter  CC: Progressive lethargy and weakness  HPI: Maurice Patterson is an 62 y.o. male 62 y.o male with past medical history of CAD, hypertension, seizure disorder on Lamictal 100 mg twice daily, chronic pancreatitis, herpes simplex encephalitis, diabetes, CVA, MI, hyperlipidemia, and Alzheimer's type dementia presenting to the ED 07/22/2018 with 2 days of progressive lethargy and weakness.  Patient is altered and unable to provide history so most of the history obtained from patient's chart.  Per ED reports patient's wife report that prior to this episode patient has been able to care for himself and ambulate around the house without assistance.  However for the past 2 days patient has been increasingly lethargic, unable to walk or care for himself which is not normal for him.  She was concerned that patient might have had a stroke therefore brought him to the ED. On arrival to the ED, initial CT scan showed no acute intracranial abnormality.  Labs revealed slightly elevated ammonia of 38, potassium of 2.8, normal white count blood cultures pending.  X-ray showed suspected subtle infiltrate in the right upper lung and left base.  He had a follow-up MRI of the brain which did not show acute intracranial abnormality.  Patient has been evaluated in the past (2017) with similar presentation at [facility]. He had extensive work up including LP done on 11/03/2015 with reassuring cell count and negative CSF HSV, VZV, and culture. Several serum paraneoplastic panels were sent and negative.  Serum autoimmune encephalopathy panel was sent and negative. 14-3-3 for [name] disease was also performed which was negative. He had EEG done at that which showed diffuse slowing and foci of epileptogenic potential in the left temporal lobe; prolonged EEG completed and showed no seizures. He remains on Lamictal 100 mg BID.  Past Medical History:   Diagnosis Date  . Acute MI ([place])   . Arthritis   . Back pain   . CAD (coronary artery disease)   . CHF (congestive heart failure) ([place])   . Chronic kidney disease   . Degenerative lumbar disc   . Dementia ([place])   . Diabetes mellitus without complication ([place])   . GERD (gastroesophageal reflux disease)   . Hernia of abdominal cavity   . Hyperlipemia   . Hypertension   . Malignant intraductal papillary mucinous tumor of pancreas ([place])   . Memory loss   . Pancreatitis   . Seizures ([place])    staring spells  . Shingles   . Stroke ([place]) 08/2017  . TIA (transient ischemic attack)     Past Surgical History:  Procedure Laterality Date  . CARDIAC CATHETERIZATION    . CORONARY ANGIOPLASTY    . CYSTOSCOPY WITH STENT PLACEMENT Right 05/13/2017   Procedure: CYSTOSCOPY WITH STENT PLACEMENT;  Surgeon: Nickie Retort, MD;  Location: [facility] ORS;  Service: Urology;  Laterality: Right;  . ERCP N/A 09/12/2015   Procedure: ENDOSCOPIC RETROGRADE CHOLANGIOPANCREATOGRAPHY (ERCP);  Surgeon: Hulen Luster, MD;  Location: [facility] ENDOSCOPY;  Service: Gastroenterology;  Laterality: N/A;  . ERCP N/A 01/02/2016   Procedure: ENDOSCOPIC RETROGRADE CHOLANGIOPANCREATOGRAPHY (ERCP);  Surgeon: Hulen Luster, MD;  Location: [facility] ENDOSCOPY;  Service: Gastroenterology;  Laterality: N/A;  . EXTERNAL FIXATION WRIST FRACTURE Left   . left arm metal plate    . LEFT HEART CATH AND CORONARY ANGIOGRAPHY Left 11/04/2016   Procedure: Left Heart Cath and Coronary Angiography;  Surgeon: Isaias Cowman, MD;  Location: [place] CV LAB;  Service: Cardiovascular;  Laterality: Left;  . Left shoulder surgery    . Stent times 3  2013   Cardiac  . TOTAL ELBOW REPLACEMENT Left   . VASECTOMY    . VENTRICULOPERITONEAL SHUNT      Family History  Problem Relation Age of Onset  . Diabetes Mother   . Hypertension Mother   . CAD Mother   . Hyperlipidemia Mother   . Stroke Mother   . ALS Mother   . Alzheimer's disease Father   .  Diabetes Father   . Heart Problems Brother     Social History:  reports that he quit smoking about 6 years ago. He has never used smokeless tobacco. He reports that he does not drink alcohol or use drugs.  Allergies  Allergen Reactions  . Hydrocodone Anaphylaxis  . Morphine Other (See Comments)    Loss of memory  . Ambien [Zolpidem] Other (See Comments)    delirium    . Brilinta [Ticagrelor] Other (See Comments)    Stroke   . Flexeril [Cyclobenzaprine] Other (See Comments)    delerium   . Flunitrazepam Other (See Comments)    ROHYPNOL (hallucinations)  . Haldol [Haloperidol Lactate] Other (See Comments)    delerium   . Levetiracetam Diarrhea and Other (See Comments)    Unable to walk  . Lorazepam Hives  . Risperdal [Risperidone] Other (See Comments)    Delirium   . Trazodone Other (See Comments)    Delirium Can take in low doses   . Benadryl [Diphenhydramine Hcl (Sleep)] Rash  . Penicillins Rash    Mouth ulcers Has patient had a PCN reaction causing immediate rash, facial/tongue/throat swelling, SOB or lightheadedness with hypotension: Yes Has patient had a PCN reaction causing severe rash involving mucus membranes or skin necrosis: No Has patient had a PCN reaction that required hospitalization No Has patient had a PCN reaction occurring within the last 10 years: Yes If all of the above answers are "NO", then may proceed with Cephalosporin use.    Medications:  I have reviewed the patient's current medications. Prior to Admission:  Medications Prior to Admission  Medication Sig Dispense Refill Last Dose  . acetaminophen (TYLENOL) 500 MG tablet Take 500 mg by mouth 3 (three) times daily.   07/22/2018 at Unknown time  . aspirin EC 81 MG tablet Take 81 mg by mouth daily.   07/22/2018 at Unknown time  . botulinum toxin Type A (BOTOX) 100 units SOLR injection Inject 100 Units into the muscle every 3 (three) months.   unknown at unknown  . cholecalciferol (VITAMIN D)  1000 units tablet Take 1,000 Units by mouth daily.    07/22/2018 at Unknown time  . clopidogrel (PLAVIX) 75 MG tablet Take 75 mg by mouth daily.    07/22/2018 at Unknown time  . colestipol (COLESTID) 1 g tablet Take 1 g by mouth daily.    07/22/2018 at Unknown time  . diphenoxylate-atropine (LOMOTIL) 2.5-0.025 MG tablet Take 1 tablet by mouth 3 (three) times daily as needed for diarrhea or loose stools.   prn at prn  . donepezil (ARICEPT) 10 MG tablet Take 1 tablet (10 mg total) by mouth 2 (two) times daily. 180 tablet 3 07/22/2018 at am  . furosemide (LASIX) 40 MG tablet Take 40 mg by mouth daily.    07/22/2018 at Unknown time  . insulin NPH Human (HUMULIN N,NOVOLIN N) 100 UNIT/ML injection Inject 0.25 mLs (25 Units total) into the skin 2 (two) times daily. (Patient taking  differently: Inject 32 Units into the skin 2 (two) times daily. ) 10 mL 11 07/22/2018 at am  . insulin regular (NOVOLIN R,HUMULIN R) 100 units/mL injection Inject 0.3 mLs (30 Units total) into the skin 3 (three) times daily with meals. (Patient taking differently: Inject 34 Units into the skin 3 (three) times daily before meals. 34 units subcutaneous 3 times daily with meals with an additional sliding scale of 8 units added with every 20 glucose reading over 200.) 10 mL 1 07/22/2018 at Unknown time  . isosorbide mononitrate (IMDUR) 30 MG 24 hr tablet Take 30 mg by mouth daily.    07/22/2018 at Unknown time  . lamoTRIgine (LAMICTAL) 100 MG tablet Take 100 mg by mouth 2 (two) times daily.   07/22/2018 at am  . lidocaine (LIDODERM) 5 % Place 1 patch onto the skin daily as needed (pain). Remove & Discard patch within 12 hours or as directed by MD (APPLIED TO PATIENT BACK FOR PAIN)   Past Month at Unknown time  . losartan (COZAAR) 100 MG tablet Take 100 mg by mouth daily.   07/22/2018 at Unknown time  . memantine (NAMENDA) 10 MG tablet Take 1 tablet (10 mg total) by mouth 2 (two) times daily. 180 tablet 3 07/22/2018 at am  . metoprolol  succinate (TOPROL-XL) 50 MG 24 hr tablet Take 50 mg by mouth daily. Take with or immediately following a meal.    07/22/2018 at Unknown time  . pantoprazole (PROTONIX) 40 MG tablet Take 40 mg by mouth daily.    07/22/2018 at Unknown time  . potassium chloride (K-DUR) 10 MEQ tablet Take 10 mEq by mouth 2 (two) times daily.  2 07/22/2018 at am  . nitroGLYCERIN (NITROSTAT) 0.4 MG SL tablet Place 1 tablet under the tongue every 5 (five) minutes as needed for chest pain.    Taking   Scheduled: . aspirin EC  81 mg Oral Daily  . donepezil  10 mg Oral BID  . enoxaparin (LOVENOX) injection  40 mg Subcutaneous Q24H  . insulin aspart  0-9 Units Subcutaneous TID WC  . insulin glargine  10 Units Subcutaneous QHS  . lactulose  20 g Oral Daily  . lamoTRIgine  100 mg Oral BID  . lidocaine  1 patch Transdermal Q24H  . memantine  10 mg Oral BID  . metoprolol succinate  50 mg Oral Daily  . pantoprazole  40 mg Oral QHS    ROS: Unable to obtain from patient due to current altered mental status  Physical Examination: Blood pressure (!) 143/80, pulse 99, temperature 98.4 F (36.9 C), temperature source Oral, resp. rate 18, height 5\' 8"  (1.727 m), weight 71.6 kg, SpO2 91 %.  HEENT-  Normocephalic, no lesions, without obvious abnormality.  Normal external eye and conjunctiva.  Normal TM's bilaterally.  Normal auditory canals and external ears. Normal external nose, mucus membranes and septum.  Normal pharynx. Cardiovascular- S1, S2 normal, pulses palpable throughout   Lungs- chest clear, no wheezing, rales, normal symmetric air entry Abdomen- soft, non-tender; bowel sounds normal; no masses,  no organomegaly Extremities- no edema Lymph-no adenopathy palpable Musculoskeletal-no joint tenderness, deformity or swelling Skin-warm and dry, no hyperpigmentation, vitiligo, or suspicious lesions  Neurological Exam   Mental Status: Drowsy. Minimally responsive.  Speech mumble and incomprehensible.    Unable to  follow commands Cranial Nerves: II: Discs flat bilaterally; blinks to visual threats bilaterally, pupils equal, round, reactive to light and accommodation III,IV, VI: ptosis not present, extra-ocular motions intact bilaterally V,VII:  smile symmetric, facial light touch sensation intact VIII: hearing normal bilaterally IX,X: gag reflex deferred XI: Unable to assess XII: midline tongue extension Motor: Moves bilateral upper and lower extremity spontaneously.  Has increased tone, rigidity but with volitional component.  No startle response, no overt focal deficit.  Positive palmomental reflex. Sensory: Withdraws upper and lower extremities to noxious stimuli Deep Tendon Reflexes: 2+ and symmetric throughout Plantars: Right: Downgoing                              Left: Downgoing Cerebellar: Unable to assess Gait: not tested due to safety concerns  Data Reviewed  Laboratory Studies:   Basic Metabolic Panel: Recent Labs  Lab 07/22/18 2239 07/23/18 0206 07/23/18 1544 07/24/18 0805 07/25/18 0310  NA 140 141  --  139 137  K 2.8* 2.6* 2.8* 3.2* 3.5  CL 102 104  --  102 102  CO2 29 29  --  24 23  GLUCOSE 164* 121*  --  154* 145*  BUN 11 10  --  6* 7*  CREATININE 0.76 0.69  --  0.67 0.61  CALCIUM 8.3* 8.1*  --  8.3* 8.1*  MG  --   --  2.0  --   --     Liver Function Tests: Recent Labs  Lab 07/22/18 2239  AST 21  ALT 13  ALKPHOS 141*  BILITOT 0.7  PROT 7.0  ALBUMIN 3.4*   No results for input(s): LIPASE, AMYLASE in the last 168 hours. Recent Labs  Lab 07/23/18 1006  AMMONIA 38*    CBC: Recent Labs  Lab 07/22/18 2239 07/23/18 0206 07/24/18 0805  WBC 10.5 10.2 10.7*  NEUTROABS 7.7  --   --   HGB 12.7* 11.9* 12.4*  HCT 38.5* 37.0* 38.3*  MCV 89.3 91.1 90.3  PLT 286 298 300    Cardiac Enzymes: Recent Labs  Lab 07/22/18 2239  TROPONINI <0.03    BNP: Invalid input(s): POCBNP  CBG: Recent Labs  Lab 07/24/18 0809 07/24/18 1150 07/24/18 1708  07/24/18 2112 07/25/18 0806  GLUCAP 146* 94 124* 120* 139*    Microbiology: Results for orders placed or performed during the hospital encounter of 07/22/18  Urine culture     Status: None   Collection Time: 07/22/18 11:03 PM  Result Value Ref Range Status   Specimen Description   Final    URINE, RANDOM Performed at [facility], [address], [place], [place] [address]    Special Requests   Final    NONE Performed at [facility], [address], [place], [place] [address]    Culture   Final    NO GROWTH Performed at [facility], [place] [address], [place], [place] [address]    Report Status 07/24/2018 FINAL  Final  CULTURE, BLOOD (ROUTINE X 2) w Reflex to ID Panel     Status: None (Preliminary result)   Collection Time: 07/23/18 10:06 AM  Result Value Ref Range Status   Specimen Description BLOOD BLOOD LEFT ARM  Final   Special Requests   Final    BOTTLES DRAWN AEROBIC AND ANAEROBIC Blood Culture adequate volume   Culture   Final    NO GROWTH 2 DAYS Performed at [facility], [address] [place],  [address]    Report Status PENDING  Incomplete  CULTURE, BLOOD (ROUTINE X 2) w Reflex to ID Panel     Status: None (Preliminary  result)   Collection Time: 07/23/18 10:17 AM  Result Value Ref Range Status   Specimen Description BLOOD BLOOD LEFT WRIST  Final   Special Requests   Final    BOTTLES DRAWN AEROBIC AND ANAEROBIC Blood Culture adequate volume   Culture   Final    NO GROWTH 2 DAYS Performed at [facility], [address] [place], [place] [address]    Report Status PENDING  Incomplete    Coagulation Studies: No results for input(s): LABPROT, INR in the last 72 hours.  Urinalysis:  Recent Labs  Lab 07/22/18 2303  COLORURINE AMBER*  LABSPEC 1.028  PHURINE 5.0  GLUCOSEU NEGATIVE  HGBUR NEGATIVE  BILIRUBINUR NEGATIVE  KETONESUR NEGATIVE  PROTEINUR 30*  NITRITE NEGATIVE  LEUKOCYTESUR NEGATIVE     Lipid Panel:     Component Value Date/Time   CHOL 65 04/05/2016 0236   CHOL 73 09/19/2014 0407   TRIG 35 04/05/2016 0236   TRIG 98 09/19/2014 0407   HDL 37 (L) 04/05/2016 0236   HDL 23 (L) 09/19/2014 0407   CHOLHDL 1.8 04/05/2016 0236   VLDL 7 04/05/2016 0236   VLDL 20 09/19/2014 0407   LDLCALC 21 04/05/2016 0236   LDLCALC 30 09/19/2014 0407    HgbA1C:  Lab Results  Component Value Date   HGBA1C 8.9 (H) 04/04/2016    Urine Drug Screen:      Component Value Date/Time   LABOPIA NONE DETECTED 07/22/2018 2303   COCAINSCRNUR NONE DETECTED 07/22/2018 2303   LABBENZ NONE DETECTED 07/22/2018 2303   AMPHETMU NONE DETECTED 07/22/2018 2303   THCU NONE DETECTED 07/22/2018 2303   LABBARB NONE DETECTED 07/22/2018 2303    Alcohol Level: No results for input(s): ETH in the last 168 hours.  Other results: EKG: normal EKG, normal sinus rhythm, unchanged from previous tracings. Vent. rate 83 BPM PR interval * ms QRS duration 107 ms QT/QTc 405/476 ms P-R-T axes 60 54 66  Imaging: Ct Head Wo Contrast  Result Date: 07/24/2018 CLINICAL DATA:  Initial evaluation for acute trauma, fall, found down. EXAM: CT HEAD WITHOUT CONTRAST TECHNIQUE: Contiguous axial images were obtained from the base of the skull through the vertex without intravenous contrast. COMPARISON:  Prior CT from 07/22/2018. FINDINGS: Brain: Generalized cerebral atrophy with moderate chronic small vessel ischemic disease, stable. No acute intracranial hemorrhage. No acute large vessel territory infarct. No mass lesion, midline shift or mass effect. No hydrocephalus. No extra-axial fluid collection. Vascular: No hyperdense vessel. Calcified atherosclerosis at the skull base. Skull: Scalp soft tissues demonstrate no acute finding. Calvarium intact. Sinuses/Orbits: Globes orbital soft tissues within normal limits. Paranasal sinuses are clear. Trace right mastoid effusion noted, likely chronic. Mastoid air cells otherwise clear.  Other: None. IMPRESSION: 1. Stable appearance of the head with no acute intracranial abnormality identified. 2. Atrophy with chronic microvascular ischemic disease. Electronically Signed   By: Jeannine Boga M.D.   On: 07/24/2018 03:27   Mr Brain Wo Contrast  Result Date: 07/23/2018 CLINICAL DATA:  Altered level of consciousness. Progressive lethargy. EXAM: MRI HEAD WITHOUT CONTRAST TECHNIQUE: Multiplanar, multiecho pulse sequences of the brain and surrounding structures were obtained without intravenous contrast. COMPARISON:  Head CT 07/22/2018 and MRI 02/20/2015 FINDINGS: The patient was unable to tolerate the complete examination. Axial and coronal diffusion, sagittal T1, and axial FLAIR sequences were obtained and are moderately motion degraded. Brain: There is no evidence of acute infarct. Cerebral atrophy is mildly advanced for age. Scattered cerebral white matter T2 hyperintensities are grossly similar  to the prior MRI and nonspecific but compatible with mild chronic small vessel ischemic disease. Vascular: Not evaluated on this limited study. Skull and upper cervical spine: No destructive calvarial lesion. Sinuses/Orbits: Grossly clear sinuses. No gross orbital abnormality. Other: None. IMPRESSION: 1. Motion degraded, incomplete examination. 2. No acute infarct. 3. Mild chronic small vessel ischemic disease and cerebral atrophy. Electronically Signed   By: Logan Bores M.D.   On: 07/23/2018 14:57   Assessment: 62 y.o male with past medical history of CAD, hypertension, seizure disorder on Lamictal 100 mg twice daily, chronic pancreatitis, herpes simplex encephalitis, diabetes, CVA, MI, hyperlipidemia, and Alzheimer's type dementia presenting to the ED 07/22/2018 with 2 days of progressive lethargy and weakness.  Found to have fever of unknown etiology and encephalopathic.  Acute encephalopathy of unknown etiology in the setting of FUO and probable pneumonia.  Started on empiric antibiotics for  suspected pneumonia.  MRI brain reviewed and did not show acute intracranial abnormality.  Labs thus far reveal slightly elevated ammonia of 38, potassium of 2.8, normal white count blood cultures pending.  X-ray showed suspected subtle infiltrate in the right upper lung and left base.  Has been evaluated in the past (2017) with similar presentation at [facility]. He had extensive work up including LP done on 11/03/2015 with reassuring cell count and negative CSF HSV, VZV, and culture. Several serum paraneoplastic panels were sent and negative.  Serum autoimmune encephalopathy panel was sent and negative. 14-3-3 for [name] disease was performed which was negative. He had EEG done at that which showed diffuse slowing and foci of epileptogenic potential in the left temporal lobe; prolonged EEG completed and showed no seizures. He remains on Lamictal 100 mg BID.  Plan Encephalopathy that is concerning for CNS infection like recurrence of HSV encephalitis  1. LP pending for CSF analysis ( HSV PCR) 2. EEG pending R/O subclinical sz 3. Labs: TSH, Folate, Vitamin B12, 4. Start empiric treatment for CNS infection including HSV encephalitis/meningitis 5-MRI brain 6-ID consult  7-Load with 400 mg IV of Vimpat then continue Vimpat 200 mg IV Q12, continue lamictal

## 2018-07-25 NOTE — Care Management (Addendum)
No family present when I rounded this morning.  Home health list left at bedside with CM contact information. RNCM will follow. Patient is resting peacefully and not requiring supplemental O2.

## 2018-07-25 NOTE — Telephone Encounter (Signed)
I called patient today to confirm his 12/9 appointment with [facility]. His wife answered the phone and stated that patient is currently in the hospital with double pneumonia. His wife then stated that she had some concerns that Dr. Jaynee Eagles and Jinny Blossom may not be aware of this situation. She asked me if a message could be sent to [facility] to have her contact patient's wife. I stated that I would pass information along and the nurse will be reaching out.

## 2018-07-25 NOTE — Progress Notes (Signed)
Pharmacy Antibiotic Note  Maurice Patterson is a 62 y.o. male admitted on 07/22/2018 with rule out meningitis.  Pharmacy has been consulted for vancomycin, ceftriaxone, ampicillin and acyclovir dosing. Per notes, patient has h/o HSV encephalitis.  Today, 07/25/2018  afebrile  WBC slightly elevated  For LP today - orders included to check HSV PCR from CSF  Renal: SCr Stable  Plan:  Vancomycin 1gm x1 then 1gm IV q12h  Goal trough 15-20 mcg/mL  Check trough if remains on vancomycin > 48h  Ampicillin - Dr Tressia Miners to clarify PCN allergy and determine if appropriate to continue with ampicillin vs. Alternative agent for listeria coverage  Acyclovir 10mg /kg (700mg ) IV q8h - approved by Infectious disease  Follow-up on HSV test result and d/c if negative  Ceftriaxone 2gm IV q12h   Height: 5\' 8"  (172.7 cm) Weight: 157 lb 12.8 oz (71.6 kg) IBW/kg (Calculated) : 68.4  Temp (24hrs), Avg:98.8 F (37.1 C), Min:98.4 F (36.9 C), Max:99.3 F (37.4 C)  Recent Labs  Lab 07/22/18 2239 07/23/18 0206 07/24/18 0805 07/25/18 0310  WBC 10.5 10.2 10.7*  --   CREATININE 0.76 0.69 0.67 0.61    Estimated Creatinine Clearance: 93.8 mL/min (by C-G formula based on SCr of 0.61 mg/dL).    Allergies  Allergen Reactions  . Hydrocodone Anaphylaxis  . Morphine Other (See Comments)    Loss of memory  . Ambien [Zolpidem] Other (See Comments)    delirium    . Brilinta [Ticagrelor] Other (See Comments)    Stroke   . Flexeril [Cyclobenzaprine] Other (See Comments)    delerium   . Flunitrazepam Other (See Comments)    ROHYPNOL (hallucinations)  . Haldol [Haloperidol Lactate] Other (See Comments)    delerium   . Levetiracetam Diarrhea and Other (See Comments)    Unable to walk  . Lorazepam Hives  . Risperdal [Risperidone] Other (See Comments)    Delirium   . Trazodone Other (See Comments)    Delirium Can take in low doses   . Benadryl [Diphenhydramine Hcl (Sleep)] Rash  .  Penicillins Rash    Mouth ulcers Has patient had a PCN reaction causing immediate rash, facial/tongue/throat swelling, SOB or lightheadedness with hypotension: Yes Has patient had a PCN reaction causing severe rash involving mucus membranes or skin necrosis: No Has patient had a PCN reaction that required hospitalization No Has patient had a PCN reaction occurring within the last 10 years: Yes If all of the above answers are "NO", then may proceed with Cephalosporin use.    Antimicrobials this admission: 12/3 vanco >> 12/3 ceftriaxone >> 12/3 acyclovir >>  Dose adjustments this admission:   Microbiology results: 12/1 BCx: NGTD 11/30 UCx: NG 12/2 Influenza: neg 12/3 CSF: 12/3 HSV PCR:   Thank you for allowing pharmacy to be a part of this patient's care.  Doreene Eland, PharmD, BCPS.   Work Cell: [phone] 07/25/2018 11:26 AM

## 2018-07-25 NOTE — Progress Notes (Signed)
[place] [facility] [place] NAME: Maurice Patterson    MR#:  [id number]  DATE OF BIRTH:  Jan 14, 1956  SUBJECTIVE:  CHIEF COMPLAINT:  No chief complaint on file.  -  Still about the same, lethargic- afebrile today - ordered LP  REVIEW OF SYSTEMS:  Review of Systems  Unable to perform ROS: Mental status change    DRUG ALLERGIES:   Allergies  Allergen Reactions  . Hydrocodone Anaphylaxis  . Morphine Other (See Comments)    Loss of memory  . Ambien [Zolpidem] Other (See Comments)    delirium    . Brilinta [Ticagrelor] Other (See Comments)    Stroke   . Flexeril [Cyclobenzaprine] Other (See Comments)    delerium   . Flunitrazepam Other (See Comments)    ROHYPNOL (hallucinations)  . Haldol [Haloperidol Lactate] Other (See Comments)    delerium   . Levetiracetam Diarrhea and Other (See Comments)    Unable to walk  . Lorazepam Hives  . Risperdal [Risperidone] Other (See Comments)    Delirium   . Trazodone Other (See Comments)    Delirium Can take in low doses   . Benadryl [Diphenhydramine Hcl (Sleep)] Rash  . Penicillins Rash    Mouth ulcers Has patient had a PCN reaction causing immediate rash, facial/tongue/throat swelling, SOB or lightheadedness with hypotension: Yes Has patient had a PCN reaction causing severe rash involving mucus membranes or skin necrosis: No Has patient had a PCN reaction that required hospitalization No Has patient had a PCN reaction occurring within the last 10 years: Yes If all of the above answers are "NO", then may proceed with Cephalosporin use.    VITALS:  Blood pressure (!) 143/80, pulse 99, temperature 98.4 F (36.9 C), temperature source Oral, resp. rate 18, height 5\' 8"  (1.727 m), weight 71.6 kg, SpO2 91 %.  PHYSICAL EXAMINATION:  Physical Exam  GENERAL:  62 y.o.-year-old patient lying in the bed with no acute distress.  EYES: Pupils equal, round, reactive to light and accommodation. No  scleral icterus. Extraocular muscles intact.  HEENT: Head atraumatic, normocephalic. Oropharynx and nasopharynx clear. Dry mucus membranes NECK:  Supple, no jugular venous distention. No thyroid enlargement, no tenderness.  LUNGS: Normal breath sounds bilaterally, no wheezing, rales,rhonchi or crepitation. No use of accessory muscles of respiration. Decreased bibasilar breath sounds CARDIOVASCULAR: S1, S2 normal. No murmurs, rubs, or gallops.  ABDOMEN: Soft, nontender, nondistended. Bowel sounds present. No organomegaly or mass.  EXTREMITIES: No pedal edema, cyanosis, or clubbing.  NEUROLOGIC:  No obvious facial droop- lethargic, stiffer left upper extremity, opening eyes to tactile stimulation and speaking- but incomprehensible words PSYCHIATRIC: The patient is very lethargic SKIN: No obvious rash, lesion, or ulcer.    LABORATORY PANEL:   CBC Recent Labs  Lab 07/24/18 0805  WBC 10.7*  HGB 12.4*  HCT 38.3*  PLT 300   ------------------------------------------------------------------------------------------------------------------  Chemistries  Recent Labs  Lab 07/22/18 2239  07/23/18 1544  07/25/18 0310  NA 140   < >  --    < > 137  K 2.8*   < > 2.8*   < > 3.5  CL 102   < >  --    < > 102  CO2 29   < >  --    < > 23  GLUCOSE 164*   < >  --    < > 145*  BUN 11   < >  --    < > 7*  CREATININE 0.76   < >  --    < > 0.61  CALCIUM 8.3*   < >  --    < > 8.1*  MG  --   --  2.0  --   --   AST 21  --   --   --   --   ALT 13  --   --   --   --   ALKPHOS 141*  --   --   --   --   BILITOT 0.7  --   --   --   --    < > = values in this interval not displayed.   ------------------------------------------------------------------------------------------------------------------  Cardiac Enzymes Recent Labs  Lab 07/22/18 2239  TROPONINI <0.03   ------------------------------------------------------------------------------------------------------------------  RADIOLOGY:  Ct Head  Wo Contrast  Result Date: 07/24/2018 CLINICAL DATA:  Initial evaluation for acute trauma, fall, found down. EXAM: CT HEAD WITHOUT CONTRAST TECHNIQUE: Contiguous axial images were obtained from the base of the skull through the vertex without intravenous contrast. COMPARISON:  Prior CT from 07/22/2018. FINDINGS: Brain: Generalized cerebral atrophy with moderate chronic small vessel ischemic disease, stable. No acute intracranial hemorrhage. No acute large vessel territory infarct. No mass lesion, midline shift or mass effect. No hydrocephalus. No extra-axial fluid collection. Vascular: No hyperdense vessel. Calcified atherosclerosis at the skull base. Skull: Scalp soft tissues demonstrate no acute finding. Calvarium intact. Sinuses/Orbits: Globes orbital soft tissues within normal limits. Paranasal sinuses are clear. Trace right mastoid effusion noted, likely chronic. Mastoid air cells otherwise clear. Other: None. IMPRESSION: 1. Stable appearance of the head with no acute intracranial abnormality identified. 2. Atrophy with chronic microvascular ischemic disease. Electronically Signed   By: Jeannine Boga M.D.   On: 07/24/2018 03:27   Mr Brain Wo Contrast  Result Date: 07/23/2018 CLINICAL DATA:  Altered level of consciousness. Progressive lethargy. EXAM: MRI HEAD WITHOUT CONTRAST TECHNIQUE: Multiplanar, multiecho pulse sequences of the brain and surrounding structures were obtained without intravenous contrast. COMPARISON:  Head CT 07/22/2018 and MRI 02/20/2015 FINDINGS: The patient was unable to tolerate the complete examination. Axial and coronal diffusion, sagittal T1, and axial FLAIR sequences were obtained and are moderately motion degraded. Brain: There is no evidence of acute infarct. Cerebral atrophy is mildly advanced for age. Scattered cerebral white matter T2 hyperintensities are grossly similar to the prior MRI and nonspecific but compatible with mild chronic small vessel ischemic disease.  Vascular: Not evaluated on this limited study. Skull and upper cervical spine: No destructive calvarial lesion. Sinuses/Orbits: Grossly clear sinuses. No gross orbital abnormality. Other: None. IMPRESSION: 1. Motion degraded, incomplete examination. 2. No acute infarct. 3. Mild chronic small vessel ischemic disease and cerebral atrophy. Electronically Signed   By: Logan Bores M.D.   On: 07/23/2018 14:57    EKG:   Orders placed or performed during the hospital encounter of 07/22/18  . EKG 12-Lead  . EKG 12-Lead  . EKG 12-Lead  . EKG 12-Lead  . ED EKG  . ED EKG    ASSESSMENT AND PLAN:   62 year old male with past medical history significant for CAD, CHF, CKD, dementia, diabetes, hypertension and seizure disorder presents to hospital secondary to worsening weakness and poor appetite  1.  Community-acquired pneumonia-blood cultures are negative. -Was started on Rocephin and azithromycin as chest x-ray had evidence of some infiltrate. -Clinically no improvement noted. -Remains on room air  2.  Acute metabolic encephalopathy-CT of the head is negative.  ABG with no CO2 retention.  Ammonia is borderline elevated at 39. -MRI of the brain did not show any acute findings.  Has mild to moderate cerebral atrophy. -No history of seizures.  Sugars are within normal limits. 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  Hypokalemia- replaced   4.  Diabetes mellitus-on Lantus and sliding scale insulin.  If remains lethargic and unable to eat- continue low dose of Lantus.  5.  History of seizure disorder/myoclonic jerks-on Lamictal -According to wife, patient does not have any history of seizures  6.  DVT prophylaxis-on Lovenox  Physical therapy consult when more stable   All the records are reviewed and case discussed with  Care Management/Social Workerr. Management plans discussed with the patient, family and they are in agreement.  CODE STATUS: Full Code  TOTAL TIME TAKING CARE OF THIS PATIENT: 41 minutes.   POSSIBLE D/C IN 2 DAYS, DEPENDING ON CLINICAL CONDITION.   Gladstone Lighter M.D on 07/25/2018 at 10:26 AM  Between 7am to 6pm - Pager - [phone]  After 6pm go to [email or web address] - password [email or web address] [facility]  Office  [phone]  CC: Primary care physician; [name], MD

## 2018-07-26 ENCOUNTER — Inpatient Hospital Stay: Payer: PPO

## 2018-07-26 LAB — CSF CELL COUNT WITH DIFFERENTIAL
Eosinophils, CSF: 0 %
Eosinophils, CSF: 0 %
Lymphs, CSF: 54 %
Lymphs, CSF: 62 %
MONOCYTE-MACROPHAGE-SPINAL FLUID: 38 %
Monocyte-Macrophage-Spinal Fluid: 46 %
Other Cells, CSF: 0
Other Cells, CSF: 0
RBC Count, CSF: 0 /mm3 (ref 0–3)
RBC Count, CSF: 0 /mm3 (ref 0–3)
Segmented Neutrophils-CSF: 0 %
Segmented Neutrophils-CSF: 0 %
TUBE #: 1
TUBE #: 3
WBC, CSF: 2 /mm3 (ref 0–5)
WBC, CSF: 6 /mm3 — ABNORMAL HIGH (ref 0–5)

## 2018-07-26 LAB — GLUCOSE, CAPILLARY
GLUCOSE-CAPILLARY: 123 mg/dL — AB (ref 70–99)
Glucose-Capillary: 184 mg/dL — ABNORMAL HIGH (ref 70–99)
Glucose-Capillary: 141 mg/dL — ABNORMAL HIGH (ref 70–99)
Glucose-Capillary: 136 mg/dL — ABNORMAL HIGH (ref 70–99)

## 2018-07-26 LAB — PROTEIN, CSF: Total  Protein, CSF: 46 mg/dL — ABNORMAL HIGH (ref 15–45)

## 2018-07-26 LAB — GRAM STAIN: GRAM STAIN: NONE SEEN

## 2018-07-26 LAB — GLUCOSE, CSF: Glucose, CSF: 99 mg/dL — ABNORMAL HIGH (ref 40–70)

## 2018-07-26 MED ORDER — ENOXAPARIN SODIUM 40 MG/0.4ML ~~LOC~~ SOLN
40.0000 mg | SUBCUTANEOUS | Status: DC
  Administered 2018-07-27 – 2018-08-27 (×31): 40 mg via SUBCUTANEOUS
  Filled 2018-07-26 (×32): qty 0.4

## 2018-07-26 NOTE — Care Management Important Message (Signed)
Important Message  Patient Details  Name: Maurice Patterson MRN: [id number] Date of Birth: 09-22-1955   Medicare Important Message Given:  Yes    Juliann Pulse A Arlita Buffkin 07/26/2018, 11:19 AM

## 2018-07-26 NOTE — Procedures (Signed)
Lumbar puncture without difficulty  Opening pressure 14.5    Clear CSF obtained   Complications:  None  Blood Loss: none  See dictation in canopy pacs

## 2018-07-26 NOTE — Telephone Encounter (Signed)
noted 

## 2018-07-26 NOTE — Telephone Encounter (Signed)
Spoke to Burrows, wife of pt.  She was letting us know what was going on with her husband.  He is currently having a LP for possible brain infection.  He is being treated for double pneumonia and on IV ABX, but not getting better.  So the reason for LP.  She states that she does not know what is going on, no one will tell her anything.  I relayed that can see in Epic notes of his admission and care.  They are able to see our notes too since we are in epic.  I told her that we cannot treat her husband while in hospital, that the hospitalist team ( all specialties if needed) will be taking care of him.   She is former [employer] scientist and is aware of the way system works.  She is frustrated.  I relayed needs to go up chain of command if not satisfied with answers she is getting.  She told me to cancel the appt for Monday with MM/NP.  (this was already done).  She appreciated call.  Will let us know how he does.  She also stated he has not been able to take his memory meds.

## 2018-07-26 NOTE — Progress Notes (Addendum)
[place] [facility] [place] NAME: Maurice Patterson    MR#:  [id number]  DATE OF BIRTH:  16-Jan-1956  SUBJECTIVE:  CHIEF COMPLAINT:  No chief complaint on file.  -Remains lethargic.  No improvement noted.  Has a tent mask as he is a mouth breather to prevent dryness of the oropharynx. -For lumbar puncture today.  Remains afebrile  REVIEW OF SYSTEMS:  Review of Systems  Unable to perform ROS: Mental status change    DRUG ALLERGIES:   Allergies  Allergen Reactions  . Hydrocodone Anaphylaxis  . Morphine Other (See Comments)    Loss of memory  . Ambien [Zolpidem] Other (See Comments)    delirium    . Brilinta [Ticagrelor] Other (See Comments)    Stroke   . Flexeril [Cyclobenzaprine] Other (See Comments)    delerium   . Flunitrazepam Other (See Comments)    ROHYPNOL (hallucinations)  . Haldol [Haloperidol Lactate] Other (See Comments)    delerium   . Levetiracetam Diarrhea and Other (See Comments)    Unable to walk  . Lorazepam Hives  . Risperdal [Risperidone] Other (See Comments)    Delirium   . Trazodone Other (See Comments)    Delirium Can take in low doses   . Benadryl [Diphenhydramine Hcl (Sleep)] Rash  . Penicillins Rash    Mouth ulcers Has patient had a PCN reaction causing immediate rash, facial/tongue/throat swelling, SOB or lightheadedness with hypotension: Yes Has patient had a PCN reaction causing severe rash involving mucus membranes or skin necrosis: No Has patient had a PCN reaction that required hospitalization No Has patient had a PCN reaction occurring within the last 10 years: Yes If all of the above answers are "NO", then may proceed with Cephalosporin use.    VITALS:  Blood pressure (!) 107/96, pulse (!) 101, temperature 98.9 F (37.2 C), temperature source Axillary, resp. rate 18, height 5\' 8"  (1.727 m), weight 71.6 kg, SpO2 96 %.  PHYSICAL EXAMINATION:  Physical Exam  GENERAL:  62 y.o.-year-old  patient lying in the bed with no acute distress.  EYES: Pupils equal, round, reactive to light and accommodation. No scleral icterus. Extraocular muscles intact.  HEENT: Head atraumatic, normocephalic. Oropharynx and nasopharynx clear. Dry mucus membranes NECK:  Supple, no jugular venous distention. No thyroid enlargement, no tenderness.  LUNGS: Normal breath sounds bilaterally, no wheezing, rales,rhonchi or crepitation. No use of accessory muscles of respiration. Decreased bibasilar breath sounds CARDIOVASCULAR: S1, S2 normal. No murmurs, rubs, or gallops.  ABDOMEN: Soft, nontender, nondistended. Bowel sounds present. No organomegaly or mass.  EXTREMITIES: No pedal edema, cyanosis, or clubbing.  NEUROLOGIC:  No obvious facial droop- lethargic, stiffer left upper extremity, opening eyes to tactile stimulation and speaking- but incomprehensible words PSYCHIATRIC: The patient is very lethargic SKIN: No obvious rash, lesion, or ulcer.    LABORATORY PANEL:   CBC Recent Labs  Lab 07/24/18 0805  WBC 10.7*  HGB 12.4*  HCT 38.3*  PLT 300   ------------------------------------------------------------------------------------------------------------------  Chemistries  Recent Labs  Lab 07/22/18 2239  07/23/18 1544  07/25/18 0310  NA 140   < >  --    < > 137  K 2.8*   < > 2.8*   < > 3.5  CL 102   < >  --    < > 102  CO2 29   < >  --    < > 23  GLUCOSE 164*   < >  --    < >  145*  BUN 11   < >  --    < > 7*  CREATININE 0.76   < >  --    < > 0.61  CALCIUM 8.3*   < >  --    < > 8.1*  MG  --   --  2.0  --   --   AST 21  --   --   --   --   ALT 13  --   --   --   --   ALKPHOS 141*  --   --   --   --   BILITOT 0.7  --   --   --   --    < > = values in this interval not displayed.   ------------------------------------------------------------------------------------------------------------------  Cardiac Enzymes Recent Labs  Lab 07/22/18 2239  TROPONINI <0.03    ------------------------------------------------------------------------------------------------------------------  RADIOLOGY:  Ct Head Wo Contrast  Result Date: 07/26/2018 CLINICAL DATA:  Confusion. Pneumonia. History of stroke and dementia. EXAM: CT HEAD WITHOUT CONTRAST TECHNIQUE: Contiguous axial images were obtained from the base of the skull through the vertex without intravenous contrast. COMPARISON:  CT HEAD July 24, 2018 and MRI head July 23, 2018 FINDINGS: BRAIN: No intraparenchymal hemorrhage, mass effect nor midline shift. Stable moderate parenchymal brain volume loss. No hydrocephalus. Patchy supratentorial white matter hypodensities. No acute large vascular territory infarcts. No abnormal extra-axial fluid collections. Basal cisterns are patent. VASCULAR: Moderate calcific atherosclerosis of the carotid siphons and included vertebral arteries. SKULL: No skull fracture. Calcified stylohyoid ligaments. No significant scalp soft tissue swelling. SINUSES/ORBITS: Trace paranasal sinus mucosal thickening. Mastoid air cells are well aerated.The included ocular globes and orbital contents are non-suspicious. OTHER: Patient is edentulous. IMPRESSION: 1. No acute intracranial process. 2. Stable moderate parenchymal brain volume loss, advanced for age. 3. Stable mild chronic small vessel ischemic changes. Electronically Signed   By: Elon Alas M.D.   On: 07/26/2018 04:56    EKG:   Orders placed or performed during the hospital encounter of 07/22/18  . EKG 12-Lead  . EKG 12-Lead  . EKG 12-Lead  . EKG 12-Lead  . ED EKG  . ED EKG    ASSESSMENT AND PLAN:   62 year old male with past medical history significant for CAD, CHF, CKD, dementia, diabetes, hypertension and seizure disorder presents to hospital secondary to worsening weakness and poor appetite  1.  Community-acquired pneumonia-blood cultures are negative. -On antibiotics -Clinically no improvement noted. -Remains on  room air  2.  Acute metabolic encephalopathy-CT of the head is negative.  ABG with no CO2 retention.  Ammonia is borderline elevated at 39. -MRI of the brain did not show any acute findings.  Has mild to moderate cerebral atrophy. -No history of seizures.  Sugars are within normal limits. 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 -empirically started on antibiotics with meningitis and HSV encephalitis-on vancomycin, Rocephin, ampicillin and acyclovir -Neurology consulted  3.  Hypokalemia- replaced   4.  Diabetes mellitus-on Lantus and sliding scale insulin.  as remains lethargic and unable to eat- continue low dose of Lantus.  5.  History of seizure disorder/myoclonic jerks-on Lamictal -According to wife, patient does not have any history of seizures -EEG with generalized slowing, no seizure-like activity.  Empirically Vimpat was added by neurology  6.  DVT prophylaxis-on Lovenox- held for the LP today  7. Nutrition- Patient has been NPO for 5 days now-awaiting for  LP today.  If no improvement mental status wise-consider TPN tomorrow. -Not a candidate for NG tube as patient gets agitated easily and will pull it out   Have been updating the wife every day about the plan of care after visit.  Even palliative tried to get in touch with her yesterday but patient's wife upset and did not want to talk to palliative care anymore. She wants everything to be done on her husband.   All the records are reviewed and case discussed with Care Management/Social Workerr. Management plans discussed with the patient, family and they are in agreement.  CODE STATUS: Full Code  TOTAL TIME TAKING CARE OF THIS PATIENT: 41 minutes.   POSSIBLE D/C IN 2 DAYS, DEPENDING ON CLINICAL CONDITION.   Gladstone Lighter M.D on 07/26/2018 at 1:11 PM  Between 7am to 6pm - Pager - [phone]  After 6pm go to [email or web address] -  password [email or web address] [facility]  Office  [phone]  CC: Primary care physician; [name], MD

## 2018-07-27 ENCOUNTER — Inpatient Hospital Stay: Payer: Self-pay

## 2018-07-27 DIAGNOSIS — R4182 Altered mental status, unspecified: Secondary | ICD-10-CM

## 2018-07-27 LAB — BASIC METABOLIC PANEL
Anion gap: 13 (ref 5–15)
BUN: 5 mg/dL — ABNORMAL LOW (ref 8–23)
CO2: 26 mmol/L (ref 22–32)
Calcium: 8.2 mg/dL — ABNORMAL LOW (ref 8.9–10.3)
Chloride: 98 mmol/L (ref 98–111)
Creatinine, Ser: 0.53 mg/dL — ABNORMAL LOW (ref 0.61–1.24)
GFR calc Af Amer: >60 mL/min (ref >60)
GFR calc non Af Amer: >60 mL/min (ref >60)
Glucose, Bld: 173 mg/dL — ABNORMAL HIGH (ref 70–99)
Potassium: 3.6 mmol/L (ref 3.5–5.1)
Sodium: 137 mmol/L (ref 135–145)

## 2018-07-27 LAB — GLUCOSE, CAPILLARY
Glucose-Capillary: 186 mg/dL — ABNORMAL HIGH (ref 70–99)
Glucose-Capillary: 134 mg/dL — ABNORMAL HIGH (ref 70–99)
Glucose-Capillary: 120 mg/dL — ABNORMAL HIGH (ref 70–99)
Glucose-Capillary: 196 mg/dL — ABNORMAL HIGH (ref 70–99)

## 2018-07-27 LAB — CBC
HEMATOCRIT: 41.3 % (ref 39.0–52.0)
Hemoglobin: 13.8 g/dL (ref 13.0–17.0)
MCH: 28.8 pg (ref 26.0–34.0)
MCHC: 33.4 g/dL (ref 30.0–36.0)
MCV: 86.2 fL (ref 80.0–100.0)
Platelets: 291 10*3/uL (ref 150–400)
RBC: 4.79 MIL/uL (ref 4.22–5.81)
RDW: 13.2 % (ref 11.5–15.5)
WBC: 8.9 10*3/uL (ref 4.0–10.5)
nRBC: 0 % (ref 0.0–0.2)

## 2018-07-27 LAB — AMMONIA: Ammonia: 38 umol/L — ABNORMAL HIGH (ref 9–35)

## 2018-07-27 LAB — VANCOMYCIN, TROUGH: Vancomycin Tr: <4 ug/mL — ABNORMAL LOW (ref 15–20)

## 2018-07-27 MED ORDER — SODIUM CHLORIDE 0.9 % IV SOLN
1.0000 g | INTRAVENOUS | Status: DC
  Administered 2018-07-28: 1 g via INTRAVENOUS
  Filled 2018-07-27: qty 1
  Filled 2018-07-27: qty 10

## 2018-07-27 MED ORDER — VANCOMYCIN HCL IN DEXTROSE 1-5 GM/200ML-% IV SOLN
1000.0000 mg | Freq: Two times a day (BID) | INTRAVENOUS | Status: DC
  Administered 2018-07-27: 1000 mg via INTRAVENOUS
  Filled 2018-07-27 (×2): qty 200

## 2018-07-27 MED ORDER — DIPHENHYDRAMINE HCL 50 MG/ML IJ SOLN: 12.5000 mg | INTRAMUSCULAR | Status: AC

## 2018-07-27 MED ORDER — DIPHENHYDRAMINE HCL 50 MG/ML IJ SOLN
INTRAMUSCULAR | Status: AC
  Filled 2018-07-27: qty 1

## 2018-07-27 MED ORDER — VANCOMYCIN HCL IN DEXTROSE 1-5 GM/200ML-% IV SOLN
1000.0000 mg | Freq: Three times a day (TID) | INTRAVENOUS | Status: DC
  Filled 2018-07-27 (×2): qty 200

## 2018-07-27 NOTE — Progress Notes (Signed)
Patient unavailable, currently undergoing PIC line placement surgery at bedside.

## 2018-07-27 NOTE — Progress Notes (Signed)
Brief History Maurice Patterson is an 62 y.o. male 62 y.o male with past medical history of CAD, hypertension, seizure disorder on Lamictal 100 mg twice daily, chronic pancreatitis, herpes simplex encephalitis, diabetes, CVA, MI, hyperlipidemia, and Alzheimer's type dementia presenting to the ED 07/22/2018 with 2 days of progressive lethargy and weakness.  Patient is altered and unable to provide history so most of the history obtained from patient's chart.  Per ED reports patient's wife report that prior to this episode patient has been able to care for himself and ambulate around the house without assistance.  However for the past 2 days patient has been increasingly lethargic, unable to walk or care for himself which is not normal for him.  She was concerned that patient might have had a stroke therefore brought him to the ED. On arrival to the ED, initial CT scan showed no acute intracranial abnormality.  Labs revealed slightly elevated ammonia of 38, potassium of 2.8, normal white count blood cultures pending.  X-ray showed suspected subtle infiltrate in the right upper lung and left base.  He had a follow-up MRI of the brain which did not show acute intracranial abnormality.  Patient has been evaluated in the past (2017) with similar presentation at [facility]. He had extensive work up including LP done on 11/03/2015 with reassuring cell count and negative CSF HSV, VZV, and culture. Several serum paraneoplastic panels were sent and negative.  Serum autoimmune encephalopathy panel was sent and negative. 14-3-3 for Maurice Patterson disease was also performed which was negative. He had EEG done at that which showed diffuse slowing and foci of epileptogenic potential in the left temporal lobe; prolonged EEG completed and showed no seizures. He remains on Lamictal 100 mg BID.  Subjective LP was done yesterday, Pt remains encephalopathic   Past Medical History Past Medical History:  Diagnosis Date  . Acute MI  ([place])   . Arthritis   . Back pain   . CAD (coronary artery disease)   . CHF (congestive heart failure) ([place])   . Chronic kidney disease   . Degenerative lumbar disc   . Dementia ([place])   . Diabetes mellitus without complication ([place])   . GERD (gastroesophageal reflux disease)   . Hernia of abdominal cavity   . Hyperlipemia   . Hypertension   . Malignant intraductal papillary mucinous tumor of pancreas ([place])   . Memory loss   . Pancreatitis   . Seizures ([place])    staring spells  . Shingles   . Stroke ([place]) 08/2017  . TIA (transient ischemic attack)     Past Surgical History Past Surgical History:  Procedure Laterality Date  . CARDIAC CATHETERIZATION    . CORONARY ANGIOPLASTY    . CYSTOSCOPY WITH STENT PLACEMENT Right 05/13/2017   Procedure: CYSTOSCOPY WITH STENT PLACEMENT;  Surgeon: Nickie Retort, MD;  Location: [facility] ORS;  Service: Urology;  Laterality: Right;  . ERCP N/A 09/12/2015   Procedure: ENDOSCOPIC RETROGRADE CHOLANGIOPANCREATOGRAPHY (ERCP);  Surgeon: Hulen Luster, MD;  Location: [facility] ENDOSCOPY;  Service: Gastroenterology;  Laterality: N/A;  . ERCP N/A 01/02/2016   Procedure: ENDOSCOPIC RETROGRADE CHOLANGIOPANCREATOGRAPHY (ERCP);  Surgeon: Hulen Luster, MD;  Location: [facility] ENDOSCOPY;  Service: Gastroenterology;  Laterality: N/A;  . EXTERNAL FIXATION WRIST FRACTURE Left   . left arm metal plate    . LEFT HEART CATH AND CORONARY ANGIOGRAPHY Left 11/04/2016   Procedure: Left Heart Cath and Coronary Angiography;  Surgeon: Isaias Cowman, MD;  Location: [place] CV LAB;  Service: Cardiovascular;  Laterality: Left;  . Left shoulder surgery    . Stent times 3  2013   Cardiac  . TOTAL ELBOW REPLACEMENT Left   . VASECTOMY    . VENTRICULOPERITONEAL SHUNT      Allergies Allergies  Allergen Reactions  . Hydrocodone Anaphylaxis  . Morphine Other (See Comments)    Loss of memory  . Ambien [Zolpidem] Other (See Comments)    delirium    . Brilinta [Ticagrelor] Other  (See Comments)    Stroke   . Flexeril [Cyclobenzaprine] Other (See Comments)    delerium   . Flunitrazepam Other (See Comments)    ROHYPNOL (hallucinations)  . Haldol [Haloperidol Lactate] Other (See Comments)    delerium   . Levetiracetam Diarrhea and Other (See Comments)    Unable to walk  . Lorazepam Hives  . Risperdal [Risperidone] Other (See Comments)    Delirium   . Trazodone Other (See Comments)    Delirium Can take in low doses   . Benadryl [Diphenhydramine Hcl (Sleep)] Rash  . Penicillins Rash    Mouth ulcers Has patient had a PCN reaction causing immediate rash, facial/tongue/throat swelling, SOB or lightheadedness with hypotension: Yes Has patient had a PCN reaction causing severe rash involving mucus membranes or skin necrosis: No Has patient had a PCN reaction that required hospitalization No Has patient had a PCN reaction occurring within the last 10 years: Yes If all of the above answers are "NO", then may proceed with Cephalosporin use.    Home Medications Medications Prior to Admission  Medication Sig Dispense Refill  . acetaminophen (TYLENOL) 500 MG tablet Take 500 mg by mouth 3 (three) times daily.    Marland Kitchen aspirin EC 81 MG tablet Take 81 mg by mouth daily.    . botulinum toxin Type A (BOTOX) 100 units SOLR injection Inject 100 Units into the muscle every 3 (three) months.    . cholecalciferol (VITAMIN D) 1000 units tablet Take 1,000 Units by mouth daily.     . clopidogrel (PLAVIX) 75 MG tablet Take 75 mg by mouth daily.     . colestipol (COLESTID) 1 g tablet Take 1 g by mouth daily.     . diphenoxylate-atropine (LOMOTIL) 2.5-0.025 MG tablet Take 1 tablet by mouth 3 (three) times daily as needed for diarrhea or loose stools.    . donepezil (ARICEPT) 10 MG tablet Take 1 tablet (10 mg total) by mouth 2 (two) times daily. 180 tablet 3  . furosemide (LASIX) 40 MG tablet Take 40 mg by mouth daily.     . insulin NPH Human (HUMULIN N,NOVOLIN N) 100 UNIT/ML injection  Inject 0.25 mLs (25 Units total) into the skin 2 (two) times daily. (Patient taking differently: Inject 32 Units into the skin 2 (two) times daily. ) 10 mL 11  . insulin regular (NOVOLIN R,HUMULIN R) 100 units/mL injection Inject 0.3 mLs (30 Units total) into the skin 3 (three) times daily with meals. (Patient taking differently: Inject 34 Units into the skin 3 (three) times daily before meals. 34 units subcutaneous 3 times daily with meals with an additional sliding scale of 8 units added with every 20 glucose reading over 200.) 10 mL 1  . isosorbide mononitrate (IMDUR) 30 MG 24 hr tablet Take 30 mg by mouth daily.     Marland Kitchen lamoTRIgine (LAMICTAL) 100 MG tablet Take 100 mg by mouth 2 (two) times daily.    Marland Kitchen lidocaine (LIDODERM) 5 % Place 1 patch onto the skin daily as  needed (pain). Remove & Discard patch within 12 hours or as directed by MD (APPLIED TO PATIENT BACK FOR PAIN)    . losartan (COZAAR) 100 MG tablet Take 100 mg by mouth daily.    . memantine (NAMENDA) 10 MG tablet Take 1 tablet (10 mg total) by mouth 2 (two) times daily. 180 tablet 3  . metoprolol succinate (TOPROL-XL) 50 MG 24 hr tablet Take 50 mg by mouth daily. Take with or immediately following a meal.     . pantoprazole (PROTONIX) 40 MG tablet Take 40 mg by mouth daily.     . potassium chloride (K-DUR) 10 MEQ tablet Take 10 mEq by mouth 2 (two) times daily.  2  . nitroGLYCERIN (NITROSTAT) 0.4 MG SL tablet Place 1 tablet under the tongue every 5 (five) minutes as needed for chest pain.       Hospital Medications . aspirin EC  81 mg Oral Daily  . donepezil  10 mg Oral BID  . enoxaparin (LOVENOX) injection  40 mg Subcutaneous Q24H  . insulin aspart  0-9 Units Subcutaneous TID WC  . insulin glargine  10 Units Subcutaneous QHS  . lactulose  20 g Oral Daily  . lamoTRIgine  100 mg Oral BID  . lidocaine  1 patch Transdermal Q24H  . memantine  10 mg Oral BID  . metoprolol succinate  50 mg Oral Daily  . pantoprazole  40 mg Oral QHS      Objective  Intake/Output from previous day: No intake/output data recorded. Intake/Output this shift: No intake/output data recorded. Nutritional status:  Diet Order            Diet Heart Room service appropriate? Yes; Fluid consistency: Thin  Diet effective now               Physical Exam -  Vitals:   07/27/18 0351 07/27/18 0353 07/27/18 0500 07/27/18 0835  BP: (!) 176/77 (!) 158/92    Pulse: (!) 105 (!) 101  100  Resp:    (!) 22  Temp:    (!) 97.5 F (36.4 C)  TempSrc:    Axillary  SpO2:    98%  Weight:   75.3 kg   Height:       General  Somnolent, response to noxious stimuli by opening eyes, snoring with face mask  Heart - Regular rate and rhythm - no murmer Lungs - Decreased BS B/L Abdomen - Soft - non tender Extremities - Distal pulses intact - no edema Skin - Warm and dry  Neurologic Exam:  Mental Status: Drowsy. Minimally responsive. Speech mumble and incomprehensible.   Unable to follow commands Cranial Nerves: II: Discs flat bilaterally; blinks to visual threats bilaterally, pupils equal, round, reactive to light and accommodation III,IV, VI: ptosis not present, extra-ocular motions intact bilaterally V,VII: smile symmetric, facial light touch sensationintact VIII: hearing normal bilaterally IX,X: gag reflex deferred XI: Unable to assess XII: midline tongue extension Motor: Moves bilateral upper and lower extremity spontaneously.  Has increased tone, rigidity but with volitional component.  No startle response, no overt focal deficit.  Positive palmomental reflex. Sensory: Withdraws upper and lower extremities to noxious stimuli Deep Tendon Reflexes: 2+ and symmetric throughout Plantars: Right: DowngoingLeft: Downgoing Cerebellar: Unable to assess Gait: not tested due to safety concerns  LABORATORY RESULTS:  Basic Metabolic Panel: Recent Labs  Lab 07/22/18 2239 07/23/18 0206 07/23/18 1544 07/24/18 0805  07/25/18 0310 07/27/18 0505  NA 140 141  --  139 137 137  K 2.8*  2.6* 2.8* 3.2* 3.5 3.6  CL 102 104  --  102 102 98  CO2 29 29  --  24 23 26   GLUCOSE 164* 121*  --  154* 145* 173*  BUN 11 10  --  6* 7* 5*  CREATININE 0.76 0.69  --  0.67 0.61 0.53*  CALCIUM 8.3* 8.1*  --  8.3* 8.1* 8.2*  MG  --   --  2.0  --   --   --     Liver Function Tests: Recent Labs  Lab 07/22/18 2239  AST 21  ALT 13  ALKPHOS 141*  BILITOT 0.7  PROT 7.0  ALBUMIN 3.4*   No results for input(s): LIPASE, AMYLASE in the last 168 hours. Recent Labs  Lab 07/23/18 1006  AMMONIA 38*    CBC: Recent Labs  Lab 07/22/18 2239 07/23/18 0206 07/24/18 0805 07/27/18 0505  WBC 10.5 10.2 10.7* 8.9  NEUTROABS 7.7  --   --   --   HGB 12.7* 11.9* 12.4* 13.8  HCT 38.5* 37.0* 38.3* 41.3  MCV 89.3 91.1 90.3 86.2  PLT 286 298 300 291    Cardiac Enzymes: Recent Labs  Lab 07/22/18 2239  TROPONINI <0.03    Lipid Panel: No results for input(s): CHOL, TRIG, HDL, CHOLHDL, VLDL, LDLCALC in the last 168 hours.  CBG: Recent Labs  Lab 07/26/18 1149 07/26/18 1710 07/26/18 2058 07/27/18 0829 07/27/18 1140  GLUCAP 123* 141* 136* 186* 134*    Microbiology:   Coagulation Studies: No results for input(s): LABPROT, INR in the last 72 hours.  Miscellaneous Labs:   IMAGING RESULTS Ct Head Wo Contrast  Result Date: 07/26/2018 CLINICAL DATA:  Confusion. Pneumonia. History of stroke and dementia. EXAM: CT HEAD WITHOUT CONTRAST TECHNIQUE: Contiguous axial images were obtained from the base of the skull through the vertex without intravenous contrast. COMPARISON:  CT HEAD July 24, 2018 and MRI head July 23, 2018 FINDINGS: BRAIN: No intraparenchymal hemorrhage, mass effect nor midline shift. Stable moderate parenchymal brain volume loss. No hydrocephalus. Patchy supratentorial white matter hypodensities. No acute large vascular territory infarcts. No abnormal extra-axial fluid collections. Basal cisterns are  patent. VASCULAR: Moderate calcific atherosclerosis of the carotid siphons and included vertebral arteries. SKULL: No skull fracture. Calcified stylohyoid ligaments. No significant scalp soft tissue swelling. SINUSES/ORBITS: Trace paranasal sinus mucosal thickening. Mastoid air cells are well aerated.The included ocular globes and orbital contents are non-suspicious. OTHER: Patient is edentulous. IMPRESSION: 1. No acute intracranial process. 2. Stable moderate parenchymal brain volume loss, advanced for age. 3. Stable mild chronic small vessel ischemic changes. Electronically Signed   By: Elon Alas M.D.   On: 07/26/2018 04:56   [place] Ekg Site Rite  Result Date: 07/27/2018 If Site Rite image not attached, placement could not be confirmed due to current cardiac rhythm.  Dg Fluoro Guided Loc Of Needle/cath Tip For Spinal Inject Lt  Result Date: 07/26/2018 CLINICAL DATA:  Altered mental status and fevers EXAM: DIAGNOSTIC LUMBAR PUNCTURE UNDER FLUOROSCOPIC GUIDANCE FLUOROSCOPY TIME:  Fluoroscopy Time:  48 seconds Radiation Exposure Index (if provided by the fluoroscopic device): 24.4 mGy Number of Acquired Spot Images: 1 PROCEDURE: Informed consent was obtained from the patient's wife prior to the procedure, including potential complications of headache, allergy, and pain. With the patient prone, the lower back was prepped with Betadine. 1% Lidocaine was used for local anesthesia. Lumbar puncture was performed at the L3 level using a 20 gauge needle with return of clear CSF with an opening pressure of  14.5 cm water. Fourteen ml of CSF were obtained for laboratory studies. The patient tolerated the procedure well and there were no apparent complications. IMPRESSION: Successful fluoroscopic guided lumbar puncture Electronically Signed   By: Inez Catalina M.D.   On: 07/26/2018 15:30     EEG 12/3 Generalized slowing MRI 12/1 Motion degraded, incomplete examination, No acute infarct CSF: Glucose 99, Protein  46, WBC 6, Gram stain: WBC seen but no growth   Assessment/Plan: 62 y.o male with past medical history of CAD, hypertension, seizure disorder on Lamictal 100 mg twice daily, chronic pancreatitis, herpes simplex encephalitis P/W encephalopathy with CSF suggestive of CNS infection  -CSF showed elevated WBC, HSV PCR is pending, could be encephalitis, continue ABX, follow up on CSF studies, please consult ID team to make recommendation about ABX -Continue Vimpat since Lamictal is not a great Sz meds, will repeat EEG if pt shows no improvements -Since MRI was poor study, will repeat MRI brain WO contrast if patient pulmonary status allows -Correct metabolic derangements  -Plan discussed in details with the patient's sister at bedside, will follow.   Arnaldo Natal, MD

## 2018-07-27 NOTE — Progress Notes (Signed)
Raquel Sarna, RN covering for Anderson Malta, RN alerted that PICC will be inserted after 7pm.  Carolee Rota, RN VAST

## 2018-07-27 NOTE — Progress Notes (Signed)
[facility] at [place] NAME: Maurice Patterson    MR#:  [id number]  DATE OF BIRTH:  [birth year]  SUBJECTIVE:  CHIEF COMPLAINT:  No chief complaint on file.  -Withdrawing to pain.  Opening eyes, not tracking.  Not following commands -Restless in bed -Family at bedside  REVIEW OF SYSTEMS:  Review of Systems  Unable to perform ROS: Mental status change    DRUG ALLERGIES:   Allergies  Allergen Reactions  . Hydrocodone Anaphylaxis  . Morphine Other (See Comments)    Loss of memory  . Ambien [Zolpidem] Other (See Comments)    delirium    . Brilinta [Ticagrelor] Other (See Comments)    Stroke   . Flexeril [Cyclobenzaprine] Other (See Comments)    delerium   . Flunitrazepam Other (See Comments)    ROHYPNOL (hallucinations)  . Haldol [Haloperidol Lactate] Other (See Comments)    delerium   . Levetiracetam Diarrhea and Other (See Comments)    Unable to walk  . Lorazepam Hives  . Risperdal [Risperidone] Other (See Comments)    Delirium   . Trazodone Other (See Comments)    Delirium Can take in low doses   . Benadryl [Diphenhydramine Hcl (Sleep)] Rash  . Penicillins Rash    Mouth ulcers Has patient had a PCN reaction causing immediate rash, facial/tongue/throat swelling, SOB or lightheadedness with hypotension: Yes Has patient had a PCN reaction causing severe rash involving mucus membranes or skin necrosis: No Has patient had a PCN reaction that required hospitalization No Has patient had a PCN reaction occurring within the last 10 years: Yes If all of the above answers are "NO", then may proceed with Cephalosporin use.    VITALS:  Blood pressure (!) 158/92, pulse 100, temperature (!) 97.5 F (36.4 C), temperature source Axillary, resp. rate (!) 22, height 5\' 8"  (1.727 m), weight 75.3 kg, SpO2 98 %.  PHYSICAL EXAMINATION:  Physical Exam  GENERAL:  62 y.o.-year-old patient lying in the bed with no acute distress.  EYES:  Pupils equal, round, reactive to light and accommodation. No scleral icterus. Extraocular muscles intact.  HEENT: Head atraumatic, normocephalic. Oropharynx and nasopharynx clear. Dry mucus membranes NECK:  Supple, no jugular venous distention. No thyroid enlargement, no tenderness.  LUNGS: Normal breath sounds bilaterally, no wheezing, rales,rhonchi or crepitation. No use of accessory muscles of respiration. Decreased bibasilar breath sounds CARDIOVASCULAR: S1, S2 normal. No murmurs, rubs, or gallops.  ABDOMEN: Soft, nontender, nondistended. Bowel sounds present. No organomegaly or mass.  EXTREMITIES: No pedal edema, cyanosis, or clubbing.  NEUROLOGIC:  No obvious facial droop- lethargic, stiffer left upper extremity, opening eyes to tactile stimulation and speaking- but incomprehensible words PSYCHIATRIC: The patient is very lethargic SKIN: No obvious rash, lesion, or ulcer.    LABORATORY PANEL:   CBC Recent Labs  Lab 07/27/18 0505  WBC 8.9  HGB 13.8  HCT 41.3  PLT 291   ------------------------------------------------------------------------------------------------------------------  Chemistries  Recent Labs  Lab 07/22/18 2239  07/23/18 1544  07/27/18 0505  NA 140   < >  --    < > 137  K 2.8*   < > 2.8*   < > 3.6  CL 102   < >  --    < > 98  CO2 29   < >  --    < > 26  GLUCOSE 164*   < >  --    < > 173*  BUN 11   < >  --    < >  5*  CREATININE 0.76   < >  --    < > 0.53*  CALCIUM 8.3*   < >  --    < > 8.2*  MG  --   --  2.0  --   --   AST 21  --   --   --   --   ALT 13  --   --   --   --   ALKPHOS 141*  --   --   --   --   BILITOT 0.7  --   --   --   --    < > = values in this interval not displayed.   ------------------------------------------------------------------------------------------------------------------  Cardiac Enzymes Recent Labs  Lab 07/22/18 2239  TROPONINI <0.03    ------------------------------------------------------------------------------------------------------------------  RADIOLOGY:  Ct Head Wo Contrast  Result Date: 07/26/2018 CLINICAL DATA:  Confusion. Pneumonia. History of stroke and dementia. EXAM: CT HEAD WITHOUT CONTRAST TECHNIQUE: Contiguous axial images were obtained from the base of the skull through the vertex without intravenous contrast. COMPARISON:  CT HEAD July 24, 2018 and MRI head July 23, 2018 FINDINGS: BRAIN: No intraparenchymal hemorrhage, mass effect nor midline shift. Stable moderate parenchymal brain volume loss. No hydrocephalus. Patchy supratentorial white matter hypodensities. No acute large vascular territory infarcts. No abnormal extra-axial fluid collections. Basal cisterns are patent. VASCULAR: Moderate calcific atherosclerosis of the carotid siphons and included vertebral arteries. SKULL: No skull fracture. Calcified stylohyoid ligaments. No significant scalp soft tissue swelling. SINUSES/ORBITS: Trace paranasal sinus mucosal thickening. Mastoid air cells are well aerated.The included ocular globes and orbital contents are non-suspicious. OTHER: Patient is edentulous. IMPRESSION: 1. No acute intracranial process. 2. Stable moderate parenchymal brain volume loss, advanced for age. 3. Stable mild chronic small vessel ischemic changes. Electronically Signed   By: Elon Alas M.D.   On: 07/26/2018 04:56   Dg Fluoro Guided Loc Of Needle/cath Tip For Spinal Inject Lt  Result Date: 07/26/2018 CLINICAL DATA:  Altered mental status and fevers EXAM: DIAGNOSTIC LUMBAR PUNCTURE UNDER FLUOROSCOPIC GUIDANCE FLUOROSCOPY TIME:  Fluoroscopy Time:  48 seconds Radiation Exposure Index (if provided by the fluoroscopic device): 24.4 mGy Number of Acquired Spot Images: 1 PROCEDURE: Informed consent was obtained from the patient's wife prior to the procedure, including potential complications of headache, allergy, and pain. With the  patient prone, the lower back was prepped with Betadine. 1% Lidocaine was used for local anesthesia. Lumbar puncture was performed at the L3 level using a 20 gauge needle with return of clear CSF with an opening pressure of 14.5 cm water. Fourteen ml of CSF were obtained for laboratory studies. The patient tolerated the procedure well and there were no apparent complications. IMPRESSION: Successful fluoroscopic guided lumbar puncture Electronically Signed   By: Inez Catalina M.D.   On: 07/26/2018 15:30    EKG:   Orders placed or performed during the hospital encounter of 07/22/18  . EKG 12-Lead  . EKG 12-Lead  . EKG 12-Lead  . EKG 12-Lead  . ED EKG  . ED EKG    ASSESSMENT AND PLAN:   62 year old male with past medical history significant for CAD, CHF, CKD, dementia, diabetes, hypertension and seizure disorder presents to hospital secondary to worsening weakness and poor appetite  1.  Community-acquired pneumonia-blood cultures are negative. -On antibiotics -Clinically no improvement noted. -Remains on room air-using a tent mask for mouth moisturization as patient is a mouth breather -Follow-up chest x-ray  2.  Acute metabolic encephalopathy-CT of the head is  negative.  ABG with no CO2 retention.  Ammonia is borderline elevated at 39. -MRI of the brain did not show any acute findings.  Has mild to moderate cerebral atrophy. -No history of seizures.  Sugars are within normal limits. -Prior history of HSV encephalitis according to wife.-Lumbar puncture done with elevated glucose and protein.  Less likely to be bacterial meningitis. -Gram stain is negative.. -empirically started on antibiotics with meningitis and HSV encephalitis-on vancomycin, Rocephin, ampicillin and acyclovir -Neurology consulted -Awaiting ID input.  Likely will be able to discontinue antibiotics.  Will still need acyclovir -Since no improvement, MRI of the brain with contrast ordered  3.  Hypokalemia- replaced   4.   Diabetes mellitus-on Lantus and sliding scale insulin.  as remains lethargic and unable to eat- continue low dose of Lantus.  5.  History of seizure disorder/myoclonic jerks-on Lamictal -According to wife, patient does not have any history of seizures -EEG with generalized slowing, no seizure-like activity.  Empirically Vimpat was added by neurology  6.  DVT prophylaxis-on Lovenox   7. Nutrition- Patient has been NPO for 5 days now-sinc eno improvement mental status wise-consider TPN today - PICC line ordered. -Not a candidate for NG tube as patient gets agitated easily and will pull it out   Have been updating the wife every day about the plan of care after visit.  Last update today.  Mentioned that I will call tomorrow with the MRI results-unless anything acute. Even palliative tried to get in touch with her yesterday but patient's wife upset and did not want to talk to palliative care anymore. She wants everything to be done on her husband.   All the records are reviewed and case discussed with Care Management/Social Workerr. Management plans discussed with the patient, family and they are in agreement.  CODE STATUS: Full Code  TOTAL TIME TAKING CARE OF THIS PATIENT: 42 minutes.   POSSIBLE D/C IN 2 DAYS, DEPENDING ON CLINICAL CONDITION.   Kelechi Orgeron M.D on 07/27/2018 at 1:20 PM  Between 7am to 6pm - Pager - [phone]  After 6pm go to [email or web address] - password [email or web address] [place] [facility]  Office  [phone]  CC: Primary care physician; [name], MD

## 2018-07-27 NOTE — Progress Notes (Signed)
Consent obtained for PICC placement from wife on telephone. Assessed and found vein for PICC placement. Then when we began to set up for the placement, as Zenia Resides was prepping the R arm, patient became agitated and began pulling off our sterile set up and moving all over the bed. Primary RN  Notified for sedation. Benadryl ordered. Instructed primary RN that Benadryl would not be enough to keep a sterile field and keep the patient safe from injury during the procedure. Primary RN stated multiple allergies to sedatives. Instructed primary RN that under those circumstances IV Team could not place a PICC safely. And that IR or MD intervention may be needed.

## 2018-07-27 NOTE — Progress Notes (Signed)
Initial Nutrition Assessment  DOCUMENTATION CODES:   Not applicable  INTERVENTION:  Patient is not an ideal candidate for TPN. Today is only the 5th day of inadequate oral intake. Recommendation is to wait to initiate nutrition support until day 7-10 of NPO status/inadequate intake. Would also recommend initiation of enteral nutrition via NGT over TPN. Using the GI system for enteral feeding helps prevent gut mucosal atrophy, reduces septic complications by decreasing bacterial translocation, stimulates gut motility therefore reducing the risk of ileus, and enhances the intestinal immune system. TPN increases risk of infection. However, the team feels patient would not be able to keep NGT in place, so plan is to proceed with TPN.  Plan is for placement of PICC tonight and initiation of TPN tomorrow once patient has been assessed by MD.  Tomorrow, recommend initiating Clinimix E 5/15 at 40 mL/hr over 24 hrs + 20% ILE at 20 mL/hr over 12 hrs.  After 24 hours if electrolytes, CBGs, and triglycerides are within acceptable range, advance to goal TPN regimen of Clinimix E 5/15 at 83 mL/hr over 24 hrs + 20% ILE at 20 mL/hr over 12 hrs. Provides 1894 kcal, 100 grams of protein, 1992 mL from Clinimix and 240 mL from lipids.  Provide adult MVI and trace elements as daily TPN additives.  Recommend decreasing IV fluids to 1/2 rate with initiation of TPN and discontinuing altogether once patient advances to goal TPN rate.  Consider changing CBGs and Novolog sliding scale to Q4hrs or Q6hrs for better glycemic control on TPN as patient is not eating any meals.  Monitor potassium, magnesium, and phosphorus daily until patient is at goal TPN rate and electrolytes are stable.  NUTRITION DIAGNOSIS:   Inadequate oral intake related to (acute metabolic encephalopathy) as evidenced by meal completion < 25%.  GOAL:   Patient will meet greater than or equal to 90% of their needs  MONITOR:   PO intake, Labs,  Weight trends, I & O's  REASON FOR ASSESSMENT:   Consult New TPN/TNA  ASSESSMENT:   62 year old male with PMHx of seizures, dementia, arthritis, HTN, DM, hx TIA, CAD, CHF, HLD, pancreatitis, CKD, hx CVA 08/2017 who is admitted with PNA, acute metabolic encephalopathy with prior history of HSV encephalitis s/p lumbar puncture on 12/4.   Patient asleep at time of RD assessment. He has face tent on and is receiving 6L O2/min. Patient unable to provide history in setting of encephalopathy. No family members present at time of RD assessment. Patient presented to ED on late evening of 11/30 from home. He has had a diet order since admission but meal completion has been 0% due to encephalopathy. Today is the 5th day of no nutrition in setting of AMS.  IV Access: pt only with 2 PIVs at this time; order in for PICC placement but it will not be placed until after 1900 per chart  Medications reviewed and include: Novolog 0-9 units TID (received 6 units past 24 hrs), Lantus 10 units QHS, lactulose 20 grams daily PO (unable to take), pantoprazole, NS with KCl 20 mEq/L at 75 mL/hr, acyclovir, ampicillin, ceftriaxone, vancomycin.  Labs reviewed: CBG 134-186, BUN 5, Creatinine 0.53.  Also discussed with MD. Patient's wife is concerned about nutrition and is requesting nutrition support be initiated. Plan will be for likely initiation of TPN tomorrow after assessment of patient. Team feels he would not be able to keep an NGT in place.  Patient does not meet criteria for malnutrition at this time but  is certainly at risk for development of malnutrition.  NUTRITION - FOCUSED PHYSICAL EXAM:    Most Recent Value  Orbital Region  Mild depletion  Upper Arm Region  No depletion  Thoracic and Lumbar Region  No depletion  Buccal Region  No depletion  Temple Region  Moderate depletion  Clavicle Bone Region  Mild depletion  Clavicle and Acromion Bone Region  Mild depletion  Scapular Bone Region  No depletion   Dorsal Hand  No depletion  Patellar Region  No depletion  Anterior Thigh Region  No depletion  Posterior Calf Region  No depletion  Edema (RD Assessment)  None  Hair  Reviewed  Eyes  Reviewed  Mouth  Reviewed  Skin  Reviewed  Nails  Reviewed     Diet Order:   Diet Order            Diet Heart Room service appropriate? Yes; Fluid consistency: Thin  Diet effective now             EDUCATION NEEDS:   Not appropriate for education at this time  Skin:  Skin Assessment: Reviewed RN Assessment  Last BM:  07/26/2018 - large type 6  Height:   Ht Readings from Last 1 Encounters:  07/22/18 5\' 8"  (1.727 m)   Weight:   Wt Readings from Last 1 Encounters:  07/27/18 75.3 kg   Ideal Body Weight:  70 kg  BMI:  Body mass index is 25.24 kg/m.  Estimated Nutritional Needs:   Kcal:  1845-2150 (MSJ x 1.2-1.4)  Protein:  90-105 grams (1.2-1.4 grams/kg)  Fluid:  1.8-2.1 L/day (1 mL/kcal)  Willey Blade, MS, RD, LDN Office: [phone] Pager: [phone] After Hours/Weekend Pager: [phone]

## 2018-07-27 NOTE — Progress Notes (Addendum)
Pharmacy Antibiotic Note  Maurice Patterson is a 62 y.o. male admitted on 07/22/2018 with rule out meningitis.  Pharmacy has been consulted for vancomycin, ceftriaxone, ampicillin and acyclovir dosing. Per notes, patient has h/o HSV encephalitis.  Today, 07/27/2018  afebrile  WBC slightly elevated  For LP today - orders included to check HSV PCR from CSF  Renal: SCr Stable  Plan: 12/05 @ 0500 VT < 4 mcg/mL drawn appropriately, however 1800 dose from last night was not given (documented). Continue vancomycin 1 gm Q12H, give all doses, and recheck level tomorrow at 17:30.   Height: 5\' 8"  (172.7 cm) Weight: 166 lb (75.3 kg) IBW/kg (Calculated) : 68.4  Temp (24hrs), Avg:98.5 F (36.9 C), Min:98.1 F (36.7 C), Max:98.9 F (37.2 C)  Recent Labs  Lab 07/22/18 2239 07/23/18 0206 07/24/18 0805 07/25/18 0310 07/27/18 0505  WBC 10.5 10.2 10.7*  --  8.9  CREATININE 0.76 0.69 0.67 0.61 0.53*  VANCOTROUGH  --   --   --   --  <4*    Estimated Creatinine Clearance: 93.8 mL/min (A) (by C-G formula based on SCr of 0.53 mg/dL (L)).    Allergies  Allergen Reactions  . Hydrocodone Anaphylaxis  . Morphine Other (See Comments)    Loss of memory  . Ambien [Zolpidem] Other (See Comments)    delirium    . Brilinta [Ticagrelor] Other (See Comments)    Stroke   . Flexeril [Cyclobenzaprine] Other (See Comments)    delerium   . Flunitrazepam Other (See Comments)    ROHYPNOL (hallucinations)  . Haldol [Haloperidol Lactate] Other (See Comments)    delerium   . Levetiracetam Diarrhea and Other (See Comments)    Unable to walk  . Lorazepam Hives  . Risperdal [Risperidone] Other (See Comments)    Delirium   . Trazodone Other (See Comments)    Delirium Can take in low doses   . Benadryl [Diphenhydramine Hcl (Sleep)] Rash  . Penicillins Rash    Mouth ulcers Has patient had a PCN reaction causing immediate rash, facial/tongue/throat swelling, SOB or lightheadedness with hypotension:  Yes Has patient had a PCN reaction causing severe rash involving mucus membranes or skin necrosis: No Has patient had a PCN reaction that required hospitalization No Has patient had a PCN reaction occurring within the last 10 years: Yes If all of the above answers are "NO", then may proceed with Cephalosporin use.    Antimicrobials this admission: 12/3 vanco >> 12/3 ceftriaxone >> 12/3 acyclovir >>  Dose adjustments this admission:   Microbiology results: 12/1 BCx: NGTD 11/30 UCx: NG 12/2 Influenza: neg 12/3 CSF: 12/3 HSV PCR:   Thank you for allowing pharmacy to be a part of this patient's care.  Rajah Lamba A. Jordan Hawks, PharmD, BCPS Clinical Pharmacist 07/27/2018

## 2018-07-27 NOTE — Progress Notes (Signed)
Patient's brother and sister in the room today.  Patient's wife has requested that they do not have any power to make decisions for the patient. She is okay with sharing basic medical information with them.

## 2018-07-27 NOTE — Consult Note (Signed)
Infectious Disease     Reason for Consult Encephalopathy    Referring Physician: Claria Dice Date of Admission:  07/22/2018   Principal Problem:   CAP (community acquired pneumonia) Active Problems:   Uncontrolled type 2 diabetes mellitus with hyperglycemia, with long-term current use of insulin ([name])   Essential hypertension   Alzheimer's dementia ([name])   GERD (gastroesophageal reflux disease)   HPI: Maurice Patterson is a 62 y.o. male admitted 12/1 after having increasing confusion, weakness and poor appetite for several days.  On admit he was very confused, wbc 10 and initially no fever but spiked to 101.5. K was quite low but otherwise Bmet and lfts relatively normal.  CX on admit with possible mild infiltrates. CT hen and MRI brain negative for acute changes.   Past Medical History:  Diagnosis Date  . Acute MI ([place])   . Arthritis   . Back pain   . CAD (coronary artery disease)   . CHF (congestive heart failure) ([place])   . Chronic kidney disease   . Degenerative lumbar disc   . Dementia ([place])   . Diabetes mellitus without complication ([place])   . GERD (gastroesophageal reflux disease)   . Hernia of abdominal cavity   . Hyperlipemia   . Hypertension   . Malignant intraductal papillary mucinous tumor of pancreas ([place])   . Memory loss   . Pancreatitis   . Seizures ([place])    staring spells  . Shingles   . Stroke ([place]) 08/2017  . TIA (transient ischemic attack)    Past Surgical History:  Procedure Laterality Date  . CARDIAC CATHETERIZATION    . CORONARY ANGIOPLASTY    . CYSTOSCOPY WITH STENT PLACEMENT Right 05/13/2017   Procedure: CYSTOSCOPY WITH STENT PLACEMENT;  Surgeon: Nickie Retort, MD;  Location: [facility] ORS;  Service: Urology;  Laterality: Right;  . ERCP N/A 09/12/2015   Procedure: ENDOSCOPIC RETROGRADE CHOLANGIOPANCREATOGRAPHY (ERCP);  Surgeon: Hulen Luster, MD;  Location: [facility] ENDOSCOPY;  Service: Gastroenterology;  Laterality: N/A;  . ERCP N/A 01/02/2016    Procedure: ENDOSCOPIC RETROGRADE CHOLANGIOPANCREATOGRAPHY (ERCP);  Surgeon: Hulen Luster, MD;  Location: [facility] ENDOSCOPY;  Service: Gastroenterology;  Laterality: N/A;  . EXTERNAL FIXATION WRIST FRACTURE Left   . left arm metal plate    . LEFT HEART CATH AND CORONARY ANGIOGRAPHY Left 11/04/2016   Procedure: Left Heart Cath and Coronary Angiography;  Surgeon: Isaias Cowman, MD;  Location: [facility];  Service: Cardiovascular;  Laterality: Left;  . Left shoulder surgery    . Stent times 3  2013   Cardiac  . TOTAL ELBOW REPLACEMENT Left   . VASECTOMY    . VENTRICULOPERITONEAL SHUNT     Social History   Tobacco Use  . Smoking status: Former Smoker    Last attempt to quit: 2013    Years since quitting: 6.9  . Smokeless tobacco: Never Used  . Tobacco comment: used to smoke 2PD for 40 yrs, quit about 4 years ago  Substance Use Topics  . Alcohol use: No    Alcohol/week: 0.0 standard drinks    Comment: occasional  . Drug use: No   Family History  Problem Relation Age of Onset  . Diabetes Mother   . Hypertension Mother   . CAD Mother   . Hyperlipidemia Mother   . Stroke Mother   . ALS Mother   . Alzheimer's disease Father   . Diabetes Father   . Heart Problems Brother     Allergies:  Allergies  Allergen Reactions  . Hydrocodone Anaphylaxis  . Morphine Other (See Comments)    Loss of memory  . Ambien [Zolpidem] Other (See Comments)    delirium    . Brilinta [Ticagrelor] Other (See Comments)    Stroke   . Flexeril [Cyclobenzaprine] Other (See Comments)    delerium   . Flunitrazepam Other (See Comments)    ROHYPNOL (hallucinations)  . Haldol [Haloperidol Lactate] Other (See Comments)    delerium   . Levetiracetam Diarrhea and Other (See Comments)    Unable to walk  . Lorazepam Hives  . Risperdal [Risperidone] Other (See Comments)    Delirium   . Trazodone Other (See Comments)    Delirium Can take in low doses   . Benadryl [Diphenhydramine Hcl (Sleep)]  Rash  . Penicillins Rash    Mouth ulcers Has patient had a PCN reaction causing immediate rash, facial/tongue/throat swelling, SOB or lightheadedness with hypotension: Yes Has patient had a PCN reaction causing severe rash involving mucus membranes or skin necrosis: No Has patient had a PCN reaction that required hospitalization No Has patient had a PCN reaction occurring within the last 10 years: Yes If all of the above answers are "NO", then may proceed with Cephalosporin use.    Current antibiotics: Antibiotics Given (last 72 hours)    Date/Time Action Medication Dose Rate   07/25/18 0214 New Bag/Given   cefTRIAXone (ROCEPHIN) 1 g in sodium chloride 0.9 % 100 mL IVPB 1 g 200 mL/hr   07/25/18 0349 New Bag/Given   azithromycin (ZITHROMAX) 500 mg in sodium chloride 0.9 % 250 mL IVPB 500 mg 250 mL/hr   07/25/18 1146 New Bag/Given   vancomycin (VANCOCIN) IVPB 1000 mg/200 mL premix 1,000 mg 200 mL/hr   07/25/18 1320 New Bag/Given   acyclovir (ZOVIRAX) 700 mg in dextrose 5 % 100 mL IVPB 700 mg 114 mL/hr   07/25/18 1507 New Bag/Given   cefTRIAXone (ROCEPHIN) 2 g in sodium chloride 0.9 % 100 mL IVPB 2 g 200 mL/hr   07/25/18 1730 New Bag/Given  [ok to give per pharmacy, will monitor for signs of reaction]   ampicillin (OMNIPEN) 2 g in sodium chloride 0.9 % 100 mL IVPB 2 g 300 mL/hr   07/25/18 1847 New Bag/Given   vancomycin (VANCOCIN) IVPB 1000 mg/200 mL premix 1,000 mg 200 mL/hr   07/25/18 1938 New Bag/Given   ampicillin (OMNIPEN) 2 g in sodium chloride 0.9 % 100 mL IVPB 2 g 300 mL/hr   07/25/18 2009 New Bag/Given   cefTRIAXone (ROCEPHIN) 2 g in sodium chloride 0.9 % 100 mL IVPB 2 g 200 mL/hr   07/25/18 2236 New Bag/Given   acyclovir (ZOVIRAX) 700 mg in dextrose 5 % 100 mL IVPB 700 mg 114 mL/hr   07/25/18 2341 New Bag/Given   ampicillin (OMNIPEN) 2 g in sodium chloride 0.9 % 100 mL IVPB 2 g 300 mL/hr   07/26/18 0212 New Bag/Given   ampicillin (OMNIPEN) 2 g in sodium chloride 0.9 % 100  mL IVPB 2 g 300 mL/hr   07/26/18 0418 New Bag/Given   vancomycin (VANCOCIN) IVPB 1000 mg/200 mL premix 1,000 mg 200 mL/hr   07/26/18 0538 New Bag/Given   acyclovir (ZOVIRAX) 700 mg in dextrose 5 % 100 mL IVPB 700 mg 114 mL/hr   07/26/18 0814 New Bag/Given   ampicillin (OMNIPEN) 2 g in sodium chloride 0.9 % 100 mL IVPB 2 g 300 mL/hr   07/26/18 1118 New Bag/Given   cefTRIAXone (ROCEPHIN) 2 g in sodium chloride  0.9 % 100 mL IVPB 2 g 200 mL/hr   07/26/18 2131 New Bag/Given   ampicillin (OMNIPEN) 2 g in sodium chloride 0.9 % 100 mL IVPB 2 g 300 mL/hr   07/26/18 2200 New Bag/Given   cefTRIAXone (ROCEPHIN) 2 g in sodium chloride 0.9 % 100 mL IVPB 2 g 200 mL/hr   07/26/18 2346 New Bag/Given   acyclovir (ZOVIRAX) 700 mg in dextrose 5 % 100 mL IVPB 700 mg 114 mL/hr   07/26/18 2350 New Bag/Given   ampicillin (OMNIPEN) 2 g in sodium chloride 0.9 % 100 mL IVPB 2 g 300 mL/hr   07/27/18 0321 New Bag/Given   ampicillin (OMNIPEN) 2 g in sodium chloride 0.9 % 100 mL IVPB 2 g 300 mL/hr   07/27/18 0551 New Bag/Given   vancomycin (VANCOCIN) IVPB 1000 mg/200 mL premix 1,000 mg 200 mL/hr   07/27/18 9371 New Bag/Given   ampicillin (OMNIPEN) 2 g in sodium chloride 0.9 % 100 mL IVPB 2 g 300 mL/hr   07/27/18 1135 New Bag/Given   cefTRIAXone (ROCEPHIN) 2 g in sodium chloride 0.9 % 100 mL IVPB 2 g 200 mL/hr   07/27/18 1514 New Bag/Given   ampicillin (OMNIPEN) 2 g in sodium chloride 0.9 % 100 mL IVPB 2 g 300 mL/hr      MEDICATIONS: . aspirin EC  81 mg Oral Daily  . donepezil  10 mg Oral BID  . enoxaparin (LOVENOX) injection  40 mg Subcutaneous Q24H  . insulin aspart  0-9 Units Subcutaneous TID WC  . insulin glargine  10 Units Subcutaneous QHS  . lactulose  20 g Oral Daily  . lamoTRIgine  100 mg Oral BID  . lidocaine  1 patch Transdermal Q24H  . memantine  10 mg Oral BID  . metoprolol succinate  50 mg Oral Daily  . pantoprazole  40 mg Oral QHS    Review of Systems - 11 systems reviewed and negative per  HPI   OBJECTIVE: Temp:  [97.5 F (36.4 C)-98.7 F (37.1 C)] 98.1 F (36.7 C) (12/05 1534) Pulse Rate:  [100-115] 101 (12/05 1534) Resp:  [16-22] 18 (12/05 1534) BP: (155-180)/(77-136) 157/81 (12/05 1534) SpO2:  [95 %-100 %] 99 % (12/05 1534) Weight:  [75.3 kg] 75.3 kg (12/05 0500)   LABS: Results for orders placed or performed during the hospital encounter of 07/22/18 (from the past 48 hour(s))  Glucose, capillary     Status: Abnormal   Collection Time: 07/25/18  4:56 PM  Result Value Ref Range   Glucose-Capillary 141 (H) 70 - 99 mg/dL  Glucose, capillary     Status: Abnormal   Collection Time: 07/25/18  9:45 PM  Result Value Ref Range   Glucose-Capillary 153 (H) 70 - 99 mg/dL   Comment 1 Notify RN   Glucose, capillary     Status: Abnormal   Collection Time: 07/26/18  7:49 AM  Result Value Ref Range   Glucose-Capillary 184 (H) 70 - 99 mg/dL   Comment 1 Notify RN   Glucose, capillary     Status: Abnormal   Collection Time: 07/26/18 11:49 AM  Result Value Ref Range   Glucose-Capillary 123 (H) 70 - 99 mg/dL   Comment 1 Notify RN   CSF cell count with differential collection tube #: 1     Status: None   Collection Time: 07/26/18  3:11 PM  Result Value Ref Range   Tube # 1    Color, CSF COLORLESS COLORLESS    Comment: TUBE 1  Appearance, CSF CLEAR CLEAR   Supernatant CLEAR    RBC Count, CSF 0 0 - 3 /cu mm   WBC, CSF 2 0 - 5 /cu mm   Segmented Neutrophils-CSF 0 %   Lymphs, CSF 62 %   Monocyte-Macrophage-Spinal Fluid 38 %   Eosinophils, CSF 0 %   Other Cells, CSF 0     Comment: Performed at [facility], [address] [place], [place] [address]  CSF cell count with differential     Status: Abnormal   Collection Time: 07/26/18  3:11 PM  Result Value Ref Range   Tube # 3    Color, CSF COLORLESS COLORLESS   Appearance, CSF CLEAR CLEAR   Supernatant CLEAR    RBC Count, CSF 0 0 - 3 /cu mm   WBC, CSF 6 (H) 0 - 5 /cu mm   Segmented Neutrophils-CSF 0 %    Lymphs, CSF 54 %   Monocyte-Macrophage-Spinal Fluid 46 %   Eosinophils, CSF 0 %   Other Cells, CSF 0     Comment: Performed at [facility], [address] [place], [place] [address]  Glucose, CSF     Status: Abnormal   Collection Time: 07/26/18  3:11 PM  Result Value Ref Range   Glucose, CSF 99 (H) 40 - 70 mg/dL    Comment: Performed at [facility], [address] [place], [place] [address]  Protein, CSF     Status: Abnormal   Collection Time: 07/26/18  3:11 PM  Result Value Ref Range   Total  Protein, CSF 46 (H) 15 - 45 mg/dL    Comment: Performed at [facility], [address] [place], [place] [address]  Gram stain     Status: None   Collection Time: 07/26/18  3:11 PM  Result Value Ref Range   Specimen Description CSF    Special Requests NONE    Gram Stain      NO ORGANISMS SEEN WBC SEEN Performed at [facility], [address], [place], [place] [address]    Report Status 07/26/2018 FINAL   CSF culture     Status: None (Preliminary result)   Collection Time: 07/26/18  3:11 PM  Result Value Ref Range   Specimen Description      CSF Performed at [facility], [address], [place], [place] [address]    Special Requests      NONE Performed at [facility], [place], [place] [address]    Gram Stain      NO ORGANISMS SEEN WBC SEEN NO RBC SEEN Performed at [facility], [address] [place], [place] [address]    Culture      NO GROWTH < 12 HOURS Performed at [facility], [place] [address], [place], [place] [address]    Report Status PENDING   Glucose, capillary     Status: Abnormal   Collection Time: 07/26/18  5:10 PM  Result Value Ref Range   Glucose-Capillary 141 (H) 70 - 99 mg/dL   Comment 1 Notify RN   Glucose, capillary     Status: Abnormal   Collection Time: 07/26/18  8:58 PM  Result Value Ref Range   Glucose-Capillary 136 (H) 70 - 99 mg/dL   Comment 1 Notify  RN   Vancomycin, trough     Status: Abnormal   Collection Time: 07/27/18  5:05 AM  Result Value Ref Range   Vancomycin Tr <4 (L) 15 -  20 ug/mL    Comment: Performed at [facility], [address] [place], [place] [address]  CBC     Status: None   Collection Time: 07/27/18  5:05 AM  Result Value Ref Range   WBC 8.9 4.0 - 10.5 K/uL   RBC 4.79 4.22 - 5.81 MIL/uL   Hemoglobin 13.8 13.0 - 17.0 g/dL   HCT 41.3 39.0 - 52.0 %   MCV 86.2 80.0 - 100.0 fL   MCH 28.8 26.0 - 34.0 pg   MCHC 33.4 30.0 - 36.0 g/dL   RDW 13.2 11.5 - 15.5 %   Platelets 291 150 - 400 K/uL   nRBC 0.0 0.0 - 0.2 %    Comment: Performed at [facility], [address], [place], [place] [address]  Basic metabolic panel     Status: Abnormal   Collection Time: 07/27/18  5:05 AM  Result Value Ref Range   Sodium 137 135 - 145 mmol/L   Potassium 3.6 3.5 - 5.1 mmol/L   Chloride 98 98 - 111 mmol/L   CO2 26 22 - 32 mmol/L   Glucose, Bld 173 (H) 70 - 99 mg/dL   BUN 5 (L) 8 - 23 mg/dL   Creatinine, Ser 0.53 (L) 0.61 - 1.24 mg/dL   Calcium 8.2 (L) 8.9 - 10.3 mg/dL   GFR calc non Af Amer >60 >60 mL/min   GFR calc Af Amer >60 >60 mL/min   Anion gap 13 5 - 15    Comment: Performed at [facility], [address] [place], [place] [address]  Glucose, capillary     Status: Abnormal   Collection Time: 07/27/18  8:29 AM  Result Value Ref Range   Glucose-Capillary 186 (H) 70 - 99 mg/dL  Glucose, capillary     Status: Abnormal   Collection Time: 07/27/18 11:40 AM  Result Value Ref Range   Glucose-Capillary 134 (H) 70 - 99 mg/dL  Ammonia     Status: Abnormal   Collection Time: 07/27/18  2:13 PM  Result Value Ref Range   Ammonia 38 (H) 9 - 35 umol/L    Comment: Performed at [facility], [address] [place], [place] [address]   No components found for: ESR, C REACTIVE PROTEIN MICRO: Recent Results (from the past 720 hour(s))  Urine culture     Status: None   Collection Time:  07/22/18 11:03 PM  Result Value Ref Range Status   Specimen Description   Final    URINE, RANDOM Performed at [facility], [address] [place], [place] [address]    Special Requests   Final    NONE Performed at [facility], [address], [place], [place] [address]    Culture   Final    NO GROWTH Performed at [facility], [place] [address] [place], [place] [address]    Report Status 07/24/2018 FINAL  Final  CULTURE, BLOOD (ROUTINE X 2) w Reflex to ID Panel     Status: None (Preliminary result)   Collection Time: 07/23/18 10:06 AM  Result Value Ref Range Status   Specimen Description BLOOD BLOOD LEFT ARM  Final   Special Requests   Final    BOTTLES DRAWN AEROBIC AND ANAEROBIC Blood Culture adequate volume   Culture   Final    NO GROWTH 4 DAYS Performed at [facility], [address] [place], [place] [address]    Report Status PENDING  Incomplete  CULTURE, BLOOD (ROUTINE X  2) w Reflex to ID Panel     Status: None (Preliminary result)   Collection Time: 07/23/18 10:17 AM  Result Value Ref Range Status   Specimen Description BLOOD BLOOD LEFT WRIST  Final   Special Requests   Final    BOTTLES DRAWN AEROBIC AND ANAEROBIC Blood Culture adequate volume   Culture   Final    NO GROWTH 4 DAYS Performed at [facility], [address] [place], [place] [address]    Report Status PENDING  Incomplete  Gram stain     Status: None   Collection Time: 07/26/18  3:11 PM  Result Value Ref Range Status   Specimen Description CSF  Final   Special Requests NONE  Final   Gram Stain   Final    NO ORGANISMS SEEN WBC SEEN Performed at [facility], [address], [place], [place] [address]    Report Status 07/26/2018 FINAL  Final  CSF culture     Status: None (Preliminary result)   Collection Time: 07/26/18  3:11 PM  Result Value Ref Range Status   Specimen Description   Final    CSF Performed at [facility], [address] [place], [place] [address]    Special Requests   Final    NONE Performed at [facility], [address] [place], [place] [address]    Gram Stain   Final    NO ORGANISMS SEEN WBC SEEN NO RBC SEEN Performed at [facility], [address], [place], [place] [address]    Culture   Final    NO GROWTH < 12 HOURS Performed at [facility], [place] [address], [place], [place] [address]    Report Status PENDING  Incomplete    IMAGING: Ct Head Wo Contrast  Result Date: 07/26/2018 CLINICAL DATA:  Confusion. Pneumonia. History of stroke and dementia. EXAM: CT HEAD WITHOUT CONTRAST TECHNIQUE: Contiguous axial images were obtained from the base of the skull through the vertex without intravenous contrast. COMPARISON:  CT HEAD July 24, 2018 and MRI head July 23, 2018 FINDINGS: BRAIN: No intraparenchymal hemorrhage, mass effect nor midline shift. Stable moderate parenchymal brain volume loss. No hydrocephalus. Patchy supratentorial white matter hypodensities. No acute large vascular territory infarcts. No abnormal extra-axial fluid collections. Basal cisterns are patent. VASCULAR: Moderate calcific atherosclerosis of the carotid siphons and included vertebral arteries. SKULL: No skull fracture. Calcified stylohyoid ligaments. No significant scalp soft tissue swelling. SINUSES/ORBITS: Trace paranasal sinus mucosal thickening. Mastoid air cells are well aerated.The included ocular globes and orbital contents are non-suspicious. OTHER: Patient is edentulous. IMPRESSION: 1. No acute intracranial process. 2. Stable moderate parenchymal brain volume loss, advanced for age. 3. Stable mild chronic small vessel ischemic changes. Electronically Signed   By: Elon Alas M.D.   On: 07/26/2018 04:56   Ct Head Wo Contrast  Result Date: 07/24/2018 CLINICAL DATA:  Initial evaluation for acute trauma, fall, found down. EXAM: CT HEAD WITHOUT CONTRAST TECHNIQUE: Contiguous  axial images were obtained from the base of the skull through the vertex without intravenous contrast. COMPARISON:  Prior CT from 07/22/2018. FINDINGS: Brain: Generalized cerebral atrophy with moderate chronic small vessel ischemic disease, stable. No acute intracranial hemorrhage. No acute large vessel territory infarct. No mass lesion, midline shift or mass effect. No hydrocephalus. No extra-axial fluid collection. Vascular: No hyperdense vessel. Calcified atherosclerosis at the skull base. Skull: Scalp soft tissues demonstrate no acute finding. Calvarium intact. Sinuses/Orbits: Globes orbital soft tissues within normal  limits. Paranasal sinuses are clear. Trace right mastoid effusion noted, likely chronic. Mastoid air cells otherwise clear. Other: None. IMPRESSION: 1. Stable appearance of the head with no acute intracranial abnormality identified. 2. Atrophy with chronic microvascular ischemic disease. Electronically Signed   By: Jeannine Boga M.D.   On: 07/24/2018 03:27   Ct Head Wo Contrast  Result Date: 07/22/2018 CLINICAL DATA:  Pt arrived from home via EMS with complaints of weakness. EMS states that he was completely fine two days ago and has gradually been getting worse. Pt has Hx of dementia and diabetes. EMS stated a Negative Stroke Screen. Hx of stroke and heart stents. EXAM: CT HEAD WITHOUT CONTRAST TECHNIQUE: Contiguous axial images were obtained from the base of the skull through the vertex without intravenous contrast. COMPARISON:  04/14/2017 FINDINGS: Brain: No evidence of acute infarction, hemorrhage, hydrocephalus, extra-axial collection or mass lesion/mass effect. Mild ventricular and sulcal enlargement is noted consistent with mild generalized atrophy. Periventricular white matter hypoattenuation is also present consistent minor chronic microvascular ischemic change. Vascular: No hyperdense vessel or unexpected calcification. Skull: Normal. Negative for fracture or focal lesion.  Sinuses/Orbits: Globes and orbits are unremarkable. Visualized sinuses and mastoid air cells are clear. Other: None. IMPRESSION: 1. No acute intracranial abnormalities. 2. Mild atrophy and minor chronic microvascular ischemic change. Stable appearance from the prior study Electronically Signed   By: Lajean Manes M.D.   On: 07/22/2018 23:34   Mr Brain Wo Contrast  Result Date: 07/23/2018 CLINICAL DATA:  Altered level of consciousness. Progressive lethargy. EXAM: MRI HEAD WITHOUT CONTRAST TECHNIQUE: Multiplanar, multiecho pulse sequences of the brain and surrounding structures were obtained without intravenous contrast. COMPARISON:  Head CT 07/22/2018 and MRI 02/20/2015 FINDINGS: The patient was unable to tolerate the complete examination. Axial and coronal diffusion, sagittal T1, and axial FLAIR sequences were obtained and are moderately motion degraded. Brain: There is no evidence of acute infarct. Cerebral atrophy is mildly advanced for age. Scattered cerebral white matter T2 hyperintensities are grossly similar to the prior MRI and nonspecific but compatible with mild chronic small vessel ischemic disease. Vascular: Not evaluated on this limited study. Skull and upper cervical spine: No destructive calvarial lesion. Sinuses/Orbits: Grossly clear sinuses. No gross orbital abnormality. Other: None. IMPRESSION: 1. Motion degraded, incomplete examination. 2. No acute infarct. 3. Mild chronic small vessel ischemic disease and cerebral atrophy. Electronically Signed   By: Logan Bores M.D.   On: 07/23/2018 14:57   Dg Chest Port 1 View  Result Date: 07/22/2018 CLINICAL DATA:  Weakness. EXAM: PORTABLE CHEST 1 VIEW COMPARISON:  December 12, 2017 FINDINGS: No pneumothorax. The heart, hila, and mediastinum are normal. Mild opacity in the left base and right upper lung. No other acute abnormalities. IMPRESSION: Suspected subtle infiltrate in the right upper lung and left base. Recommend follow-up to resolution.  Electronically Signed   By: Dorise Bullion III M.D   On: 07/22/2018 23:42   [place] Ekg Site Rite  Result Date: 07/27/2018 If Site Rite image not attached, placement could not be confirmed due to current cardiac rhythm.  Dg Fluoro Guided Loc Of Needle/cath Tip For Spinal Inject Lt  Result Date: 07/26/2018 CLINICAL DATA:  Altered mental status and fevers EXAM: DIAGNOSTIC LUMBAR PUNCTURE UNDER FLUOROSCOPIC GUIDANCE FLUOROSCOPY TIME:  Fluoroscopy Time:  48 seconds Radiation Exposure Index (if provided by the fluoroscopic device): 24.4 mGy Number of Acquired Spot Images: 1 PROCEDURE: Informed consent was obtained from the patient's wife prior to the procedure, including potential complications of headache, allergy,  and pain. With the patient prone, the lower back was prepped with Betadine. 1% Lidocaine was used for local anesthesia. Lumbar puncture was performed at the L3 level using a 20 gauge needle with return of clear CSF with an opening pressure of 14.5 cm water. Fourteen ml of CSF were obtained for laboratory studies. The patient tolerated the procedure well and there were no apparent complications. IMPRESSION: Successful fluoroscopic guided lumbar puncture Electronically Signed   By: Inez Catalina M.D.   On: 07/26/2018 15:30    Assessment:   JSHON IBE is a 62 y.o. male admitted with confusion, weakness, possible PNA. Remains confused. Has had MRI CT of brain with nothing acute. Seen by neurology. Utox neg, Flu neg, HIV neg, RPR neg, TSH neg, UA negative, abg unimpressive. CSF with very mild elevated wbc and protein.  I do not think he has meningitis or encephalitis and certainly does not have a bacterial meningitis with such a low wbc.  He follows with neurology as otpt for transient memory loss and expressive aphasia in past, hx of stroke and seizures.  I suspect current presentation is multifactorial.   Recommendations Can cont acyclovir pending HSV results Can dc vanco, and ampicillin  . Cont ceftraixone (has had 3 doses of 500 mg azitrho) for CAP treatment. Cont neuro work up. Thank you very much for allowing me to participate in the care of this patient. Please call with questions.   Cheral Marker. Ola Spurr, MD

## 2018-07-28 ENCOUNTER — Inpatient Hospital Stay: Payer: PPO

## 2018-07-28 LAB — COMPREHENSIVE METABOLIC PANEL
ALBUMIN: 2.9 g/dL — AB (ref 3.5–5.0)
ALT: 18 U/L (ref 0–44)
AST: 21 U/L (ref 15–41)
Alkaline Phosphatase: 149 U/L — ABNORMAL HIGH (ref 38–126)
Anion gap: 10 (ref 5–15)
BUN: 6 mg/dL — ABNORMAL LOW (ref 8–23)
CO2: 26 mmol/L (ref 22–32)
Calcium: 8.2 mg/dL — ABNORMAL LOW (ref 8.9–10.3)
Chloride: 99 mmol/L (ref 98–111)
Creatinine, Ser: 0.57 mg/dL — ABNORMAL LOW (ref 0.61–1.24)
GFR calc Af Amer: >60 mL/min (ref >60)
GFR calc non Af Amer: >60 mL/min (ref >60)
Glucose, Bld: 199 mg/dL — ABNORMAL HIGH (ref 70–99)
Potassium: 3.8 mmol/L (ref 3.5–5.1)
Sodium: 135 mmol/L (ref 135–145)
Total Bilirubin: 0.9 mg/dL (ref 0.3–1.2)
Total Protein: 6.7 g/dL (ref 6.5–8.1)

## 2018-07-28 LAB — GLUCOSE, CAPILLARY
Glucose-Capillary: 202 mg/dL — ABNORMAL HIGH (ref 70–99)
Glucose-Capillary: 195 mg/dL — ABNORMAL HIGH (ref 70–99)
Glucose-Capillary: 185 mg/dL — ABNORMAL HIGH (ref 70–99)
Glucose-Capillary: 204 mg/dL — ABNORMAL HIGH (ref 70–99)
Glucose-Capillary: 192 mg/dL — ABNORMAL HIGH (ref 70–99)

## 2018-07-28 LAB — CULTURE, BLOOD (ROUTINE X 2)
Culture: NO GROWTH
Culture: NO GROWTH
Special Requests: ADEQUATE
Special Requests: ADEQUATE

## 2018-07-28 LAB — CBC
HCT: 40.7 % (ref 39.0–52.0)
Hemoglobin: 13.6 g/dL (ref 13.0–17.0)
MCH: 29.1 pg (ref 26.0–34.0)
MCHC: 33.4 g/dL (ref 30.0–36.0)
MCV: 87.2 fL (ref 80.0–100.0)
NRBC: 0 % (ref 0.0–0.2)
Platelets: 301 10*3/uL (ref 150–400)
RBC: 4.67 MIL/uL (ref 4.22–5.81)
RDW: 13.3 % (ref 11.5–15.5)
WBC: 10.5 10*3/uL (ref 4.0–10.5)

## 2018-07-28 LAB — PHOSPHORUS: Phosphorus: 2.3 mg/dL — ABNORMAL LOW (ref 2.5–4.6)

## 2018-07-28 LAB — MAGNESIUM: Magnesium: 1.9 mg/dL (ref 1.7–2.4)

## 2018-07-28 LAB — VDRL, CSF: VDRL Quant, CSF: NONREACTIVE

## 2018-07-28 MED ORDER — INSULIN ASPART 100 UNIT/ML ~~LOC~~ SOLN
0.0000 [IU] | Freq: Four times a day (QID) | SUBCUTANEOUS | Status: DC
  Administered 2018-07-28: 3 [IU] via SUBCUTANEOUS
  Administered 2018-07-28 – 2018-07-29 (×2): 2 [IU] via SUBCUTANEOUS
  Administered 2018-07-29: 3 [IU] via SUBCUTANEOUS
  Administered 2018-07-29: 2 [IU] via SUBCUTANEOUS
  Administered 2018-07-30: 9 [IU] via SUBCUTANEOUS
  Filled 2018-07-28 (×7): qty 1

## 2018-07-28 MED ORDER — LABETALOL HCL 5 MG/ML IV SOLN
10.0000 mg | Freq: Four times a day (QID) | INTRAVENOUS | Status: DC | PRN
  Administered 2018-07-29 – 2018-08-02 (×2): 10 mg via INTRAVENOUS
  Filled 2018-07-28 (×2): qty 4

## 2018-07-28 MED ORDER — FAT EMULSION PLANT BASED 20 % IV EMUL: 240.0000 mL | INTRAVENOUS | Status: DC

## 2018-07-28 MED ORDER — POTASSIUM PHOSPHATES 15 MMOLE/5ML IV SOLN
15.0000 mmol | Freq: Once | INTRAVENOUS | Status: AC
  Administered 2018-07-28: 15 mmol via INTRAVENOUS
  Filled 2018-07-28 (×3): qty 5

## 2018-07-28 MED ORDER — TRACE MINERALS CR-CU-MN-SE-ZN 10-1000-500-60 MCG/ML IV SOLN: INTRAVENOUS | Status: DC

## 2018-07-28 MED ORDER — GADOBUTROL 1 MMOL/ML IV SOLN
7.0000 mL | Freq: Once | INTRAVENOUS | Status: AC | PRN
  Administered 2018-07-28: 7 mL via INTRAVENOUS

## 2018-07-28 NOTE — Progress Notes (Signed)
Patient returned to unit from EEG

## 2018-07-28 NOTE — Progress Notes (Signed)
[facility] NAME: Maurice Patterson    MR#:  [id number]  DATE OF BIRTH:  June 05, 1956  SUBJECTIVE:  CHIEF COMPLAINT:  No chief complaint on file.  -Withdrawing to pain.  Opening eyes, not tracking.  Not following commands -Slightly better than yesterday  REVIEW OF SYSTEMS:  Review of Systems  Unable to perform ROS: Mental status change    DRUG ALLERGIES:   Allergies  Allergen Reactions  . Hydrocodone Anaphylaxis  . Morphine Other (See Comments)    Loss of memory  . Ambien [Zolpidem] Other (See Comments)    delirium    . Brilinta [Ticagrelor] Other (See Comments)    Stroke   . Flexeril [Cyclobenzaprine] Other (See Comments)    delerium   . Flunitrazepam Other (See Comments)    ROHYPNOL (hallucinations)  . Haldol [Haloperidol Lactate] Other (See Comments)    delerium   . Levetiracetam Diarrhea and Other (See Comments)    Unable to walk  . Lorazepam Hives  . Risperdal [Risperidone] Other (See Comments)    Delirium   . Trazodone Other (See Comments)    Delirium Can take in low doses   . Benadryl [Diphenhydramine Hcl (Sleep)] Rash  . Penicillins Rash    Mouth ulcers Has patient had a PCN reaction causing immediate rash, facial/tongue/throat swelling, SOB or lightheadedness with hypotension: Yes Has patient had a PCN reaction causing severe rash involving mucus membranes or skin necrosis: No Has patient had a PCN reaction that required hospitalization No Has patient had a PCN reaction occurring within the last 10 years: Yes If all of the above answers are "NO", then may proceed with Cephalosporin use.    VITALS:  Blood pressure (!) 149/88, pulse (!) 108, temperature 99 F (37.2 C), temperature source Axillary, resp. rate 20, height 5\' 8"  (1.727 m), weight 75.3 kg, SpO2 90 %.  PHYSICAL EXAMINATION:  Physical Exam  GENERAL:  62 y.o.-year-old patient lying in the bed with no acute distress.  EYES: Pupils equal,  round, reactive to light and accommodation. No scleral icterus. Extraocular muscles intact.  HEENT: Head atraumatic, normocephalic. Oropharynx and nasopharynx clear. Dry mucus membranes NECK:  Supple, no jugular venous distention. No thyroid enlargement, no tenderness.  LUNGS: Normal breath sounds bilaterally, no wheezing, rales,rhonchi or crepitation. No use of accessory muscles of respiration. Decreased bibasilar breath sounds CARDIOVASCULAR: S1, S2 normal. No murmurs, rubs, or gallops.  ABDOMEN: Soft, nontender, nondistended. Bowel sounds present. No organomegaly or mass.  EXTREMITIES: No pedal edema, cyanosis, or clubbing.  NEUROLOGIC:  No obvious facial droop- lethargic, stiffer left upper extremity, opening eyes to tactile stimulation and speaking- but incomprehensible words PSYCHIATRIC: The patient is opening eyes to his name today.  Not following commands.  Withdrawing to touch. SKIN: No obvious rash, lesion, or ulcer.    LABORATORY PANEL:   CBC Recent Labs  Lab 07/28/18 0519  WBC 10.5  HGB 13.6  HCT 40.7  PLT 301   ------------------------------------------------------------------------------------------------------------------  Chemistries  Recent Labs  Lab 07/28/18 0519  NA 135  K 3.8  CL 99  CO2 26  GLUCOSE 199*  BUN 6*  CREATININE 0.57*  CALCIUM 8.2*  MG 1.9  AST 21  ALT 18  ALKPHOS 149*  BILITOT 0.9   ------------------------------------------------------------------------------------------------------------------  Cardiac Enzymes Recent Labs  Lab 07/22/18 2239  TROPONINI <0.03   ------------------------------------------------------------------------------------------------------------------  RADIOLOGY:  Dg Chest 2 View  Result Date: 07/28/2018 CLINICAL DATA:  Hypoxia EXAM: CHEST - 2 VIEW COMPARISON:  07/22/2018 FINDINGS: Cardiac shadow is stable. The lungs are well aerated bilaterally. Previously seen infiltrative changes have nearly completely  resolved. No new infiltrates are seen. Old rib fractures are noted on the right. No new bony abnormality is seen. IMPRESSION: Near complete resolution of previously seen infiltrates. Electronically Signed   By: Inez Catalina M.D.   On: 07/28/2018 08:00   Mr Jeri Cos ON Contrast  Result Date: 07/28/2018 CLINICAL DATA:  Initial evaluation for progressive altered mental status with history of dementia, evaluate for possible meningitis. EXAM: MRI HEAD WITHOUT AND WITH CONTRAST TECHNIQUE: Multiplanar, multiecho pulse sequences of the brain and surrounding structures were obtained without and with intravenous contrast. CONTRAST:  7 cc of Gadavist. COMPARISON:  Prior CT from 07/26/2018. FINDINGS: Brain: Examination moderately to severely degraded by motion artifact. Generalized age-related cerebral atrophy. Probable tiny remote right cerebellar infarct noted. No abnormal foci of restricted diffusion to suggest acute or subacute ischemia. Gray-white matter differentiation maintained. No encephalomalacia to suggest chronic cortical infarction. No definite abnormal foci of susceptibility artifact to suggest acute or chronic intracranial hemorrhage. No mass lesion, midline shift or mass effect. No hydrocephalus. No extra-axial fluid collection. No definite abnormal enhancement or changes to suggest meningitis. Pituitary gland grossly within normal limits. Vascular: Major intracranial vascular flow voids maintained at the skull base. Skull and upper cervical spine: Craniocervical junction within normal limits. No focal marrow replacing lesion. Scalp soft tissues unremarkable. Sinuses/Orbits: Globes and orbital soft tissues within normal limits. Paranasal sinuses are largely clear. Small bilateral mastoid effusions noted. Inner ear structures grossly normal. Other: None. IMPRESSION: 1. Technically limited examination due to extensive motion artifact. 2. No definite acute intracranial abnormality. No overt evidence for  meningitis. 3. Mild age-related parenchymal volume loss. Electronically Signed   By: Jeannine Boga M.D.   On: 07/28/2018 06:41   [place] Ekg Site Rite  Result Date: 07/27/2018 If Site Rite image not attached, placement could not be confirmed due to current cardiac rhythm.  Dg Fluoro Guided Loc Of Needle/cath Tip For Spinal Inject Lt  Result Date: 07/26/2018 CLINICAL DATA:  Altered mental status and fevers EXAM: DIAGNOSTIC LUMBAR PUNCTURE UNDER FLUOROSCOPIC GUIDANCE FLUOROSCOPY TIME:  Fluoroscopy Time:  48 seconds Radiation Exposure Index (if provided by the fluoroscopic device): 24.4 mGy Number of Acquired Spot Images: 1 PROCEDURE: Informed consent was obtained from the patient's wife prior to the procedure, including potential complications of headache, allergy, and pain. With the patient prone, the lower back was prepped with Betadine. 1% Lidocaine was used for local anesthesia. Lumbar puncture was performed at the L3 level using a 20 gauge needle with return of clear CSF with an opening pressure of 14.5 cm water. Fourteen ml of CSF were obtained for laboratory studies. The patient tolerated the procedure well and there were no apparent complications. IMPRESSION: Successful fluoroscopic guided lumbar puncture Electronically Signed   By: Inez Catalina M.D.   On: 07/26/2018 15:30    EKG:   Orders placed or performed during the hospital encounter of 07/22/18  . EKG 12-Lead  . EKG 12-Lead  . EKG 12-Lead  . EKG 12-Lead  . ED EKG  . ED EKG    ASSESSMENT AND PLAN:   62 year old male with past medical history significant for CAD, CHF, CKD, dementia, diabetes, hypertension and seizure disorder presents to hospital secondary to worsening weakness and poor appetite  1.  Community-acquired pneumonia-blood cultures are negative. -On antibiotics -Clinically no improvement noted. -Remains on room air-using a tent mask for mouth moisturization  as patient is a mouth breather -Follow-up chest x-ray  with improvement of his pneumonia.  - on rocephin  2.  Acute metabolic encephalopathy-CT of the head is negative.   -Possible acute encephalitis.  Prior history of HSV encephalitis. -Lumbar puncture done, no evidence of bacterial meningitis.  Normal WBC and elevated glucose and protein. -HSV tests are pending -ABG with no CO2 retention.  Ammonia is borderline elevated at 38. -MRI of the brain repeated  Twice did not show any acute findings.  Has mild to moderate cerebral atrophy. -No history of seizures.   . -Gram stain is negative.. -Vancomycin and ampicillin are discontinued now.  ID consult is appreciated -Continue acyclovir.  Also Rocephin will be continued for his pneumonia. -Neurology consulted -Initial EEG without seizure activity but generalized slowing.  Repeat EEG today  3.  Hypokalemia- replaced   4.  Diabetes mellitus-on Lantus and sliding scale insulin.  as remains lethargic and unable to eat- continue low dose of Lantus.  5.  History of seizure disorder/myoclonic jerks-on Lamictal -According to wife, patient does not have any history of seizures -EEG with generalized slowing, no seizure-like activity.  Empirically Vimpat was added by neurology  6.  DVT prophylaxis-on Lovenox   7. Nutrition- Patient has been NPO for 5 days now- -unable to get PICC line yesterday as he was very stiff and is allergic to all sedating medications.  Will hold off today and see if he improves mentally and we can avoid peripheral nutrition.  Follow-up again tomorrow. -Not a candidate for NG tube as patient gets agitated easily and will pull it out   Updated wife again today. -Palliative care was consulted, however patient's wife does not want to talk to palliative care.   All the records are reviewed and case discussed with Care Management/Social Workerr. Management plans discussed with the patient, family and they are in agreement.  CODE STATUS: Full Code  TOTAL TIME TAKING CARE OF THIS  PATIENT: 38 minutes.   POSSIBLE D/C IN 2 DAYS, DEPENDING ON CLINICAL CONDITION.   Gladstone Lighter M.D on 07/28/2018 at 1:23 PM  Between 7am to 6pm - Pager - [phone]  After 6pm go to [email or web address] - password [email or web address] [facility]  Office  [phone]  CC: Primary care physician; [name], MD

## 2018-07-28 NOTE — Progress Notes (Signed)
eeg completed ° °

## 2018-07-28 NOTE — Progress Notes (Addendum)
Patient taken off unit to EEG with O2 via bed by transporter.

## 2018-07-28 NOTE — Progress Notes (Signed)
RT called to patient bedside by RN for assessment. Patient on 28% Face Tent SAT at 93%, Pulse 103. Patient appears to be experiencing snoring and dry mouth. Patient has non-productive cough.RN at bedside.  Q4 CPT was performed with hand held percusser. Patient was not able to produce any sputum at this time. Will continue to monitor. RN aware.

## 2018-07-28 NOTE — Progress Notes (Signed)
PHARMACY - ADULT TOTAL PARENTERAL NUTRITION CONSULT NOTE   Pharmacy Consult for TPN Indication: patient/family preference/unable to keep NGT in place  Patient Measurements: Height: 5\' 8"  (172.7 cm) Weight: 166 lb (75.3 kg) IBW/kg (Calculated) : 68.4 TPN AdjBW (KG): 77.6 Body mass index is 25.24 kg/m. Usual Weight:   Assessment: 62 yom being worked up for meningitis LP pending, ID and neurology following. Family insistent on nutrition and team concerned patient could not keep NG in place for enteral feeds. RD says this patient is not an ideal candidate for parenteral nutrition but gave recommendations for starting and goals. PICC was to be placed yesterday but due to multiple drug allergies it was not completed. VAS/IV team recommended IR or MD insertion.   GI:  Endo:  Insulin requirements in the past 24 hours:  Lytes: Renal: Pulm: Cards:  Hepatobil: Neuro: ID:  TPN Access: pending TPN start date: pending Nutritional Goals (per RD recommendation on 07/27/2018): KCal: 1894 Protein: 100 g Fluid: 1992 mL  Goal TPN rate is 83 ml/hr (provides nutrition)  Current Nutrition:   Plan: PICC was delayed again today so patient will not be started on TPN 07/28/18. Follow on 12/7 for possible initiation Clinimix E 5/15 TPN at 40 mL/hr. Follow for starting ILE 20% at 20 mL/hr over 12 hours This TPN will provide 48 g of protein, 144 g of dextrose, and 48 g of lipids which provides 1248 kCals per day, meeting 63% of patient needs Electrolytes in TPN: per clinimix E Add MVI, trace elements Start sensitive SSI and adjust as needed Decrease IVMF (D5, NS, etc.) by half at initiation Monitor TPN labs per protocol F/U 07/29/2018  Laural Benes, PharmD, BCPS Clinical Pharmacist 07/28/2018,8:31 AM

## 2018-07-28 NOTE — Progress Notes (Signed)
[facility] PROGRESS NOTE Date of Admission:  07/22/2018     ID: Maurice Patterson is a 62 y.o. male with pneumonia and AMS  Principal Problem:   CAP (community acquired pneumonia) Active Problems:   Uncontrolled type 2 diabetes mellitus with hyperglycemia, with long-term current use of insulin (HCC)   Essential hypertension   Alzheimer's dementia ([place])   GERD (gastroesophageal reflux disease)   Subjective: No fevers, still minimally responsive, but opens eyes to his name. Could not place PICC due to his agitation MRI still poor study but nothing impressive, CXR with resolution of infiltrates  ROS  Unable to obtain  Medications:  Antibiotics Given (last 72 hours)    Date/Time Action Medication Dose Rate   07/25/18 1146 New Bag/Given   vancomycin (VANCOCIN) IVPB 1000 mg/200 mL premix 1,000 mg 200 mL/hr   07/25/18 1320 New Bag/Given   acyclovir (ZOVIRAX) 700 mg in dextrose 5 % 100 mL IVPB 700 mg 114 mL/hr   07/25/18 1507 New Bag/Given   cefTRIAXone (ROCEPHIN) 2 g in sodium chloride 0.9 % 100 mL IVPB 2 g 200 mL/hr   07/25/18 1730 New Bag/Given  [ok to give per pharmacy, will monitor for signs of reaction]   ampicillin (OMNIPEN) 2 g in sodium chloride 0.9 % 100 mL IVPB 2 g 300 mL/hr   07/25/18 1847 New Bag/Given   vancomycin (VANCOCIN) IVPB 1000 mg/200 mL premix 1,000 mg 200 mL/hr   07/25/18 1938 New Bag/Given   ampicillin (OMNIPEN) 2 g in sodium chloride 0.9 % 100 mL IVPB 2 g 300 mL/hr   07/25/18 2009 New Bag/Given   cefTRIAXone (ROCEPHIN) 2 g in sodium chloride 0.9 % 100 mL IVPB 2 g 200 mL/hr   07/25/18 2236 New Bag/Given   acyclovir (ZOVIRAX) 700 mg in dextrose 5 % 100 mL IVPB 700 mg 114 mL/hr   07/25/18 2341 New Bag/Given   ampicillin (OMNIPEN) 2 g in sodium chloride 0.9 % 100 mL IVPB 2 g 300 mL/hr   07/26/18 0212 New Bag/Given   ampicillin (OMNIPEN) 2 g in sodium chloride 0.9 % 100 mL IVPB 2 g 300 mL/hr   07/26/18 0418 New Bag/Given   vancomycin  (VANCOCIN) IVPB 1000 mg/200 mL premix 1,000 mg 200 mL/hr   07/26/18 0538 New Bag/Given   acyclovir (ZOVIRAX) 700 mg in dextrose 5 % 100 mL IVPB 700 mg 114 mL/hr   07/26/18 0814 New Bag/Given   ampicillin (OMNIPEN) 2 g in sodium chloride 0.9 % 100 mL IVPB 2 g 300 mL/hr   07/26/18 1118 New Bag/Given   cefTRIAXone (ROCEPHIN) 2 g in sodium chloride 0.9 % 100 mL IVPB 2 g 200 mL/hr   07/26/18 2131 New Bag/Given   ampicillin (OMNIPEN) 2 g in sodium chloride 0.9 % 100 mL IVPB 2 g 300 mL/hr   07/26/18 2200 New Bag/Given   cefTRIAXone (ROCEPHIN) 2 g in sodium chloride 0.9 % 100 mL IVPB 2 g 200 mL/hr   07/26/18 2346 New Bag/Given   acyclovir (ZOVIRAX) 700 mg in dextrose 5 % 100 mL IVPB 700 mg 114 mL/hr   07/26/18 2350 New Bag/Given   ampicillin (OMNIPEN) 2 g in sodium chloride 0.9 % 100 mL IVPB 2 g 300 mL/hr   07/27/18 0321 New Bag/Given   ampicillin (OMNIPEN) 2 g in sodium chloride 0.9 % 100 mL IVPB 2 g 300 mL/hr   07/27/18 0551 New Bag/Given   vancomycin (VANCOCIN) IVPB 1000 mg/200 mL premix 1,000 mg 200 mL/hr   07/27/18 1829 New  Bag/Given   ampicillin (OMNIPEN) 2 g in sodium chloride 0.9 % 100 mL IVPB 2 g 300 mL/hr   07/27/18 1135 New Bag/Given   cefTRIAXone (ROCEPHIN) 2 g in sodium chloride 0.9 % 100 mL IVPB 2 g 200 mL/hr   07/27/18 1514 New Bag/Given   ampicillin (OMNIPEN) 2 g in sodium chloride 0.9 % 100 mL IVPB 2 g 300 mL/hr   07/27/18 1814 New Bag/Given   vancomycin (VANCOCIN) IVPB 1000 mg/200 mL premix 1,000 mg 200 mL/hr   07/27/18 2004 New Bag/Given   acyclovir (ZOVIRAX) 700 mg in dextrose 5 % 100 mL IVPB 700 mg 114 mL/hr   07/28/18 0539 New Bag/Given   acyclovir (ZOVIRAX) 700 mg in dextrose 5 % 100 mL IVPB 700 mg 114 mL/hr     . aspirin EC  81 mg Oral Daily  . diphenhydrAMINE  12.5 mg Intravenous STAT  . donepezil  10 mg Oral BID  . enoxaparin (LOVENOX) injection  40 mg Subcutaneous Q24H  . insulin aspart  0-9 Units Subcutaneous Q6H  . insulin glargine  10 Units Subcutaneous QHS   . lactulose  20 g Oral Daily  . lamoTRIgine  100 mg Oral BID  . lidocaine  1 patch Transdermal Q24H  . memantine  10 mg Oral BID  . metoprolol succinate  50 mg Oral Daily  . pantoprazole  40 mg Oral QHS    Objective: Vital signs in last 24 hours: Temp:  [98.1 F (36.7 C)-99 F (37.2 C)] 99 F (37.2 C) (12/06 0846) Pulse Rate:  [101-108] 108 (12/06 0846) Resp:  [18-20] 20 (12/06 0350) BP: (149-162)/(81-88) 149/88 (12/06 0846) SpO2:  [90 %-100 %] 90 % (12/06 0846) Physical Exam  Constitutional: lethargic, minimally responsive but opens eyes to voice  HENT: aniceric, mild aniscoria but reactive Mouth/Throat: Oropharynx is dry, poor dentition Cardiovascular: Normal rate, regular rhythm and normal heart sounds.  Pulmonary/Chest: bil rhonchi Abdominal: Soft. Bowel sounds are normal. He exhibits no distension. There is no tenderness.  Lymphadenopathy: He has no cervical adenopathy.  Neurological: lethargic, minimally responsive but opens eyes to voice. Some rigidity on upper extremity exam. Skin: Skin is warm and dry. No rash noted. No erythema.  Psychiatric: min responsive Ext trace edema bil  Lab Results Recent Labs    07/27/18 0505 07/28/18 0519  WBC 8.9 10.5  HGB 13.8 13.6  HCT 41.3 40.7  NA 137 135  K 3.6 3.8  CL 98 99  CO2 26 26  BUN 5* 6*  CREATININE 0.53* 0.57*    Microbiology: Results for orders placed or performed during the hospital encounter of 07/22/18  Urine culture     Status: None   Collection Time: 07/22/18 11:03 PM  Result Value Ref Range Status   Specimen Description   Final    URINE, RANDOM Performed at [facility], [address], [place], [place] [address]    Special Requests   Final    NONE Performed at [facility], [address], [place], [place] [address]    Culture   Final    NO GROWTH Performed at [facility], [place] [address], [place], [place] [address]    Report Status 07/24/2018 FINAL  Final   CULTURE, BLOOD (ROUTINE X 2) w Reflex to ID Panel     Status: None   Collection Time: 07/23/18 10:06 AM  Result Value Ref Range Status   Specimen Description BLOOD BLOOD LEFT ARM  Final   Special Requests   Final  BOTTLES DRAWN AEROBIC AND ANAEROBIC Blood Culture adequate volume   Culture   Final    NO GROWTH 5 DAYS Performed at [facility], [address] [place], [place] [address]    Report Status 07/28/2018 FINAL  Final  CULTURE, BLOOD (ROUTINE X 2) w Reflex to ID Panel     Status: None   Collection Time: 07/23/18 10:17 AM  Result Value Ref Range Status   Specimen Description BLOOD BLOOD LEFT WRIST  Final   Special Requests   Final    BOTTLES DRAWN AEROBIC AND ANAEROBIC Blood Culture adequate volume   Culture   Final    NO GROWTH 5 DAYS Performed at [facility], [address], [place], [place] [address]    Report Status 07/28/2018 FINAL  Final  Gram stain     Status: None   Collection Time: 07/26/18  3:11 PM  Result Value Ref Range Status   Specimen Description CSF  Final   Special Requests NONE  Final   Gram Stain   Final    NO ORGANISMS SEEN WBC SEEN Performed at [facility], [address] [place], [place] [address]    Report Status 07/26/2018 FINAL  Final  CSF culture     Status: None (Preliminary result)   Collection Time: 07/26/18  3:11 PM  Result Value Ref Range Status   Specimen Description   Final    CSF Performed at [facility], [address], [place], [place] [address]    Special Requests   Final    NONE Performed at [facility], [address] [place], [place] [address]    Gram Stain   Final    NO ORGANISMS SEEN WBC SEEN NO RBC SEEN Performed at [facility], [address], [place], [place] [address]    Culture   Final    NO GROWTH 2 DAYS Performed at [facility], [place] [address] [place], [place] [address]    Report Status PENDING  Incomplete  Fungus Culture With  Stain     Status: None (Preliminary result)   Collection Time: 07/26/18  3:11 PM  Result Value Ref Range Status   Fungus Stain Final report  Final    Comment: (NOTE) Performed At: [facility] [place], [place] [id number] Rush Farmer MD [id number]    Fungus (Mycology) Culture PENDING  Incomplete   Fungal Source CSF  Final    Comment: Performed at [facility], [address] [place], [place] [address]  Fungus Culture Result     Status: None   Collection Time: 07/26/18  3:11 PM  Result Value Ref Range Status   Result 1 Comment  Final    Comment: (NOTE) KOH/Calcofluor preparation:  no fungus observed. Performed At: [facility] [place], [place] [id number] Rush Farmer MD QQ:[id number]     Studies/Results: Dg Chest 2 View  Result Date: 07/28/2018 CLINICAL DATA:  Hypoxia EXAM: CHEST - 2 VIEW COMPARISON:  07/22/2018 FINDINGS: Cardiac shadow is stable. The lungs are well aerated bilaterally. Previously seen infiltrative changes have nearly completely resolved. No new infiltrates are seen. Old rib fractures are noted on the right. No new bony abnormality is seen. IMPRESSION: Near complete resolution of previously seen infiltrates. Electronically Signed   By: Inez Catalina M.D.   On: 07/28/2018 08:00   Mr Jeri Cos HE Contrast  Result Date: 07/28/2018 CLINICAL DATA:  Initial evaluation for progressive altered mental status with history  of dementia, evaluate for possible meningitis. EXAM: MRI HEAD WITHOUT AND WITH CONTRAST TECHNIQUE: Multiplanar, multiecho pulse sequences of the brain and surrounding structures were obtained without and with intravenous contrast. CONTRAST:  7 cc of Gadavist. COMPARISON:  Prior CT from 07/26/2018. FINDINGS: Brain: Examination moderately to severely degraded by motion artifact. Generalized age-related cerebral atrophy. Probable tiny remote right cerebellar infarct noted. No abnormal foci of restricted  diffusion to suggest acute or subacute ischemia. Gray-white matter differentiation maintained. No encephalomalacia to suggest chronic cortical infarction. No definite abnormal foci of susceptibility artifact to suggest acute or chronic intracranial hemorrhage. No mass lesion, midline shift or mass effect. No hydrocephalus. No extra-axial fluid collection. No definite abnormal enhancement or changes to suggest meningitis. Pituitary gland grossly within normal limits. Vascular: Major intracranial vascular flow voids maintained at the skull base. Skull and upper cervical spine: Craniocervical junction within normal limits. No focal marrow replacing lesion. Scalp soft tissues unremarkable. Sinuses/Orbits: Globes and orbital soft tissues within normal limits. Paranasal sinuses are largely clear. Small bilateral mastoid effusions noted. Inner ear structures grossly normal. Other: None. IMPRESSION: 1. Technically limited examination due to extensive motion artifact. 2. No definite acute intracranial abnormality. No overt evidence for meningitis. 3. Mild age-related parenchymal volume loss. Electronically Signed   By: Jeannine Boga M.D.   On: 07/28/2018 06:41   [place] Ekg Site Rite  Result Date: 07/27/2018 If Site Rite image not attached, placement could not be confirmed due to current cardiac rhythm.  Dg Fluoro Guided Loc Of Needle/cath Tip For Spinal Inject Lt  Result Date: 07/26/2018 CLINICAL DATA:  Altered mental status and fevers EXAM: DIAGNOSTIC LUMBAR PUNCTURE UNDER FLUOROSCOPIC GUIDANCE FLUOROSCOPY TIME:  Fluoroscopy Time:  48 seconds Radiation Exposure Index (if provided by the fluoroscopic device): 24.4 mGy Number of Acquired Spot Images: 1 PROCEDURE: Informed consent was obtained from the patient's wife prior to the procedure, including potential complications of headache, allergy, and pain. With the patient prone, the lower back was prepped with Betadine. 1% Lidocaine was used for local anesthesia.  Lumbar puncture was performed at the L3 level using a 20 gauge needle with return of clear CSF with an opening pressure of 14.5 cm water. Fourteen ml of CSF were obtained for laboratory studies. The patient tolerated the procedure well and there were no apparent complications. IMPRESSION: Successful fluoroscopic guided lumbar puncture Electronically Signed   By: Inez Catalina M.D.   On: 07/26/2018 15:30    Assessment/Plan: NYREE YONKER is a 62 y.o. male admitted with confusion, weakness, possible PNA. Remains confused. Has had MRI x 2, CT of brain with nothing acute. Seen by neurology. EEG with diffuse slowing. Utox neg, Flu neg, HIV neg, RPR neg, TSH neg, UA negative, abg unimpressive. CSF with very mild elevated wbc and protein.  I do not think he has meningitis or encephalitis and certainly does not have a bacterial meningitis with such a low wbc. He follows with neurology as otpt for transient memory loss and expressive aphasia in past, hx of stroke and seizures.  I suspect current presentation is multifactorial.   Recommendations Can cont acyclovir pending HSV results - if neg PCR though can dc We stopped vanco, and ampicillin on 12/5 Cont ceftraixone (has had 3 doses of 500 mg azitrho) for CAP treatment. Cont neuro work up.  Thank you very much for the consult. Will follow with you.  Maurice Patterson   07/28/2018, 10:12 AM

## 2018-07-28 NOTE — Progress Notes (Signed)
ELECTROENCEPHALOGRAM REPORT Date of Study: 07/28/18  MRN: [id number]   Clinical History: 62 y.o.malewho presents with confusion, weakness, poor appetite for the past several days. He has had some cough.Work-up today shows pneumonia. 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  Meds: Lamictal, Vimpat    Technical Summary:  A multichannel digital EEG recording measured by the international 10-20 system with electrodes applied with paste and impedances below 5000 ohms performed in our laboratory with EKG monitoring in an awake and asleep patient. Photic stimulation were not performed. The digital EEG was referentially recorded, reformatted, and digitally filtered in a variety of bipolar and referential montages for optimal display.  Description:  The patient is somnolent and not responding during the recording. This recording showing diffuse bilateral slowing but no electrographic seizures seen.  EKG lead was unremarkable  Impression:  This comatose EEG is abnormal due to mild generalized slowing but no electrographic seizures noted.

## 2018-07-28 NOTE — Care Management Important Message (Signed)
Important Message  Patient Details  Name: Maurice Patterson MRN: [id number] Date of Birth: 16-Aug-1956   Medicare Important Message Given:  Yes    [name] 07/28/2018, 11:56 AM

## 2018-07-28 NOTE — Progress Notes (Signed)
HPI: Maurice Patterson an 62 y.o.male62 y.o malewith past medical history of CAD, hypertension, seizure disorder on Lamictal 100 mg twice daily, chronic pancreatitis, herpes simplex encephalitis, diabetes, CVA, MI, hyperlipidemia, and Alzheimer's type dementia presenting to the ED11/30/2019with 2 days of progressive lethargy and weakness. Patient is altered and unable to provide history so most of the history obtained from patient's chart. Per ED reports patient's wife report that prior to this episode patient has been able to care for himself and ambulate around the house without assistance. However for the past 2 days patient has been increasingly lethargic, unable to walk or care for himself which is not normal for him. She was concerned that patient might have had a stroke therefore brought him to the ED. On arrival to the ED,initial CT scan showed no acute intracranial abnormality. Labs revealed slightly elevated ammonia of 38, potassium of 2.8, normal white count blood cultures pending. X-ray showed suspected subtle infiltrate in the right upper lung and left base. He had a follow-up MRI of the brain which did not show acute intracranial abnormality. Patient has been evaluated in the past (2017) with similar presentation at [facility]. He had extensive work up including LP done on 11/03/2015 withreassuringcell countandnegativeCSF HSV, VZV, and culture. Several serum paraneoplastic panels were sent and negative. Serum autoimmune encephalopathy panel was sent and negative. 14-3-3 for [name] disease was also performed which was negative. He hadEEGdone at that whichshowed diffuse slowing and foci of epileptogenic potential in the left temporal lobe; prolonged EEG completed and showed no seizures. He remains on Lamictal 100 mg BID.  Subjective: Patient remains encephalopathic however he is more responsive today compared to previous assessment.  Objective: Current vital  signs: BP (!) 149/88 (BP Location: Left Arm)   Pulse (!) 108   Temp 99 F (37.2 C) (Axillary)   Resp 20   Ht 5\' 8"  (1.727 m)   Wt 75.3 kg   SpO2 90%   BMI 25.24 kg/m  Vital signs in last 24 hours: Temp:  [98.1 F (36.7 C)-99 F (37.2 C)] 99 F (37.2 C) (12/06 0846) Pulse Rate:  [101-108] 108 (12/06 0846) Resp:  [18-20] 20 (12/06 0350) BP: (149-162)/(81-88) 149/88 (12/06 0846) SpO2:  [90 %-100 %] 90 % (12/06 0846)  Intake/Output from previous day: 12/05 0701 - 12/06 0700 In: 4146.2 [I.V.:1265.1; IV Piggyback:2881.1] Out: -  Intake/Output this shift: Total I/O In: 1352.2 [I.V.:1209.6; IV Piggyback:142.5] Out: -  Nutritional status:  Diet Order            Diet Heart Room service appropriate? Yes; Fluid consistency: Thin  Diet effective now              Neurologic Exam: Neurological Exam  Mental Status: Drowsy. Minimally responsive. Speech mumble and incomprehensible.   Unable to follow commands Cranial Nerves: II: Discs flat bilaterally; blinks to visual threats bilaterally, pupils equal, round, reactive to light and accommodation III,IV, VI: ptosis not present, extra-ocular motions intact bilaterally V,VII: smile symmetric, facial light touch sensationintact VIII: hearing normal bilaterally IX,X: gag reflex deferred XI: Unable to assess XII: midline tongue extension Motor: Moves bilateral upper and lower extremity spontaneously.  Has increased tone, rigidity but with volitional component.  No startle response, no overt focal deficit.  Positive palmomental reflex. Sensory: Withdraws upper and lower extremities to noxious stimuli Deep Tendon Reflexes: 2+ and symmetric throughout Plantars: Right: DowngoingLeft: Downgoing Cerebellar: Unable to assess Gait: not tested due to safety concerns  Data Reviewed  Lab Results: Basic Metabolic  Panel: Recent Labs  Lab 07/23/18 0206 07/23/18 1544 07/24/18 0805 07/25/18 0310  07/27/18 0505 07/28/18 0519  NA 141  --  139 137 137 135  K 2.6* 2.8* 3.2* 3.5 3.6 3.8  CL 104  --  102 102 98 99  CO2 29  --  24 23 26 26   GLUCOSE 121*  --  154* 145* 173* 199*  BUN 10  --  6* 7* 5* 6*  CREATININE 0.69  --  0.67 0.61 0.53* 0.57*  CALCIUM 8.1*  --  8.3* 8.1* 8.2* 8.2*  MG  --  2.0  --   --   --  1.9  PHOS  --   --   --   --   --  2.3*    Liver Function Tests: Recent Labs  Lab 07/22/18 2239 07/28/18 0519  AST 21 21  ALT 13 18  ALKPHOS 141* 149*  BILITOT 0.7 0.9  PROT 7.0 6.7  ALBUMIN 3.4* 2.9*   No results for input(s): LIPASE, AMYLASE in the last 168 hours. Recent Labs  Lab 07/23/18 1006 07/27/18 1413  AMMONIA 38* 38*    CBC: Recent Labs  Lab 07/22/18 2239 07/23/18 0206 07/24/18 0805 07/27/18 0505 07/28/18 0519  WBC 10.5 10.2 10.7* 8.9 10.5  NEUTROABS 7.7  --   --   --   --   HGB 12.7* 11.9* 12.4* 13.8 13.6  HCT 38.5* 37.0* 38.3* 41.3 40.7  MCV 89.3 91.1 90.3 86.2 87.2  PLT 286 298 300 291 301    Cardiac Enzymes: Recent Labs  Lab 07/22/18 2239  TROPONINI <0.03    Lipid Panel: No results for input(s): CHOL, TRIG, HDL, CHOLHDL, VLDL, LDLCALC in the last 168 hours.  CBG: Recent Labs  Lab 07/27/18 1648 07/27/18 2109 07/28/18 0722 07/28/18 0842 07/28/18 1244  GLUCAP 120* 196* 202* 195* 185*    Microbiology: Results for orders placed or performed during the hospital encounter of 07/22/18  Urine culture     Status: None   Collection Time: 07/22/18 11:03 PM  Result Value Ref Range Status   Specimen Description   Final    URINE, RANDOM Performed at [facility], [address], [place], [place] [address]    Special Requests   Final    NONE Performed at [facility], [address] [place], [place] [address]    Culture   Final    NO GROWTH Performed at [facility], [place] [address] [place], [place] [address]    Report Status 07/24/2018 FINAL  Final  CULTURE, BLOOD (ROUTINE X 2) w Reflex to ID  Panel     Status: None   Collection Time: 07/23/18 10:06 AM  Result Value Ref Range Status   Specimen Description BLOOD BLOOD LEFT ARM  Final   Special Requests   Final    BOTTLES DRAWN AEROBIC AND ANAEROBIC Blood Culture adequate volume   Culture   Final    NO GROWTH 5 DAYS Performed at [facility], [address] [place], [place] [address]    Report Status 07/28/2018 FINAL  Final  CULTURE, BLOOD (ROUTINE X 2) w Reflex to ID Panel     Status: None   Collection Time: 07/23/18 10:17 AM  Result Value Ref Range Status   Specimen Description BLOOD BLOOD LEFT WRIST  Final   Special Requests   Final    BOTTLES DRAWN AEROBIC AND ANAEROBIC Blood Culture adequate volume   Culture   Final  NO GROWTH 5 DAYS Performed at [facility], [address] [place], [place] [address]    Report Status 07/28/2018 FINAL  Final  Gram stain     Status: None   Collection Time: 07/26/18  3:11 PM  Result Value Ref Range Status   Specimen Description CSF  Final   Special Requests NONE  Final   Gram Stain   Final    NO ORGANISMS SEEN WBC SEEN Performed at [facility], [address] [place], [place] [address]    Report Status 07/26/2018 FINAL  Final  CSF culture     Status: None (Preliminary result)   Collection Time: 07/26/18  3:11 PM  Result Value Ref Range Status   Specimen Description   Final    CSF Performed at [facility], [address], [place], [place] [address]    Special Requests   Final    NONE Performed at [facility], [address] [place], [place] [address]    Gram Stain   Final    NO ORGANISMS SEEN WBC SEEN NO RBC SEEN Performed at [facility], [address], [place], [place] [address]    Culture   Final    NO GROWTH 2 DAYS Performed at [facility], [place] [address] [place], [place] [address]    Report Status PENDING  Incomplete  Fungus Culture With Stain     Status: None (Preliminary result)    Collection Time: 07/26/18  3:11 PM  Result Value Ref Range Status   Fungus Stain Final report  Final    Comment: (NOTE) Performed At: [facility], [place] [id number] Rush Farmer MD NI:[id number]    Fungus (Mycology) Culture PENDING  Incomplete   Fungal Source CSF  Final    Comment: Performed at [facility], [address] [place], [place] [address]  Fungus Culture Result     Status: None   Collection Time: 07/26/18  3:11 PM  Result Value Ref Range Status   Result 1 Comment  Final    Comment: (NOTE) KOH/Calcofluor preparation:  no fungus observed. Performed At: [facility] [place], [place] [id number] Rush Farmer MD PJ:[id number]     Coagulation Studies: No results for input(s): LABPROT, INR in the last 72 hours.  Imaging: Dg Chest 2 View  Result Date: 07/28/2018 CLINICAL DATA:  Hypoxia EXAM: CHEST - 2 VIEW COMPARISON:  07/22/2018 FINDINGS: Cardiac shadow is stable. The lungs are well aerated bilaterally. Previously seen infiltrative changes have nearly completely resolved. No new infiltrates are seen. Old rib fractures are noted on the right. No new bony abnormality is seen. IMPRESSION: Near complete resolution of previously seen infiltrates. Electronically Signed   By: Inez Catalina M.D.   On: 07/28/2018 08:00   Mr Jeri Cos YK Contrast  Result Date: 07/28/2018 CLINICAL DATA:  Initial evaluation for progressive altered mental status with history of dementia, evaluate for possible meningitis. EXAM: MRI HEAD WITHOUT AND WITH CONTRAST TECHNIQUE: Multiplanar, multiecho pulse sequences of the brain and surrounding structures were obtained without and with intravenous contrast. CONTRAST:  7 cc of Gadavist. COMPARISON:  Prior CT from 07/26/2018. FINDINGS: Brain: Examination moderately to severely degraded by motion artifact. Generalized age-related cerebral atrophy. Probable tiny remote right cerebellar infarct noted. No  abnormal foci of restricted diffusion to suggest acute or subacute ischemia. Gray-white matter differentiation maintained. No encephalomalacia to suggest chronic cortical infarction. No definite abnormal foci of susceptibility artifact to suggest  acute or chronic intracranial hemorrhage. No mass lesion, midline shift or mass effect. No hydrocephalus. No extra-axial fluid collection. No definite abnormal enhancement or changes to suggest meningitis. Pituitary gland grossly within normal limits. Vascular: Major intracranial vascular flow voids maintained at the skull base. Skull and upper cervical spine: Craniocervical junction within normal limits. No focal marrow replacing lesion. Scalp soft tissues unremarkable. Sinuses/Orbits: Globes and orbital soft tissues within normal limits. Paranasal sinuses are largely clear. Small bilateral mastoid effusions noted. Inner ear structures grossly normal. Other: None. IMPRESSION: 1. Technically limited examination due to extensive motion artifact. 2. No definite acute intracranial abnormality. No overt evidence for meningitis. 3. Mild age-related parenchymal volume loss. Electronically Signed   By: Jeannine Boga M.D.   On: 07/28/2018 06:41   [place] Ekg Site Rite  Result Date: 07/27/2018 If Site Rite image not attached, placement could not be confirmed due to current cardiac rhythm.  Dg Fluoro Guided Loc Of Needle/cath Tip For Spinal Inject Lt  Result Date: 07/26/2018 CLINICAL DATA:  Altered mental status and fevers EXAM: DIAGNOSTIC LUMBAR PUNCTURE UNDER FLUOROSCOPIC GUIDANCE FLUOROSCOPY TIME:  Fluoroscopy Time:  48 seconds Radiation Exposure Index (if provided by the fluoroscopic device): 24.4 mGy Number of Acquired Spot Images: 1 PROCEDURE: Informed consent was obtained from the patient's wife prior to the procedure, including potential complications of headache, allergy, and pain. With the patient prone, the lower back was prepped with Betadine. 1% Lidocaine was  used for local anesthesia. Lumbar puncture was performed at the L3 level using a 20 gauge needle with return of clear CSF with an opening pressure of 14.5 cm water. Fourteen ml of CSF were obtained for laboratory studies. The patient tolerated the procedure well and there were no apparent complications. IMPRESSION: Successful fluoroscopic guided lumbar puncture Electronically Signed   By: Inez Catalina M.D.   On: 07/26/2018 15:30    Medications:  I have reviewed the patient's current medications. Prior to Admission:  Medications Prior to Admission  Medication Sig Dispense Refill Last Dose  . acetaminophen (TYLENOL) 500 MG tablet Take 500 mg by mouth 3 (three) times daily.   07/22/2018 at Unknown time  . aspirin EC 81 MG tablet Take 81 mg by mouth daily.   07/22/2018 at Unknown time  . botulinum toxin Type A (BOTOX) 100 units SOLR injection Inject 100 Units into the muscle every 3 (three) months.   unknown at unknown  . cholecalciferol (VITAMIN D) 1000 units tablet Take 1,000 Units by mouth daily.    07/22/2018 at Unknown time  . clopidogrel (PLAVIX) 75 MG tablet Take 75 mg by mouth daily.    07/22/2018 at Unknown time  . colestipol (COLESTID) 1 g tablet Take 1 g by mouth daily.    07/22/2018 at Unknown time  . diphenoxylate-atropine (LOMOTIL) 2.5-0.025 MG tablet Take 1 tablet by mouth 3 (three) times daily as needed for diarrhea or loose stools.   prn at prn  . donepezil (ARICEPT) 10 MG tablet Take 1 tablet (10 mg total) by mouth 2 (two) times daily. 180 tablet 3 07/22/2018 at am  . furosemide (LASIX) 40 MG tablet Take 40 mg by mouth daily.    07/22/2018 at Unknown time  . insulin NPH Human (HUMULIN N,NOVOLIN N) 100 UNIT/ML injection Inject 0.25 mLs (25 Units total) into the skin 2 (two) times daily. (Patient taking differently: Inject 32 Units into the skin 2 (two) times daily. ) 10 mL 11 07/22/2018 at am  . insulin regular (NOVOLIN R,HUMULIN R) 100  units/mL injection Inject 0.3 mLs (30 Units  total) into the skin 3 (three) times daily with meals. (Patient taking differently: Inject 34 Units into the skin 3 (three) times daily before meals. 34 units subcutaneous 3 times daily with meals with an additional sliding scale of 8 units added with every 20 glucose reading over 200.) 10 mL 1 07/22/2018 at Unknown time  . isosorbide mononitrate (IMDUR) 30 MG 24 hr tablet Take 30 mg by mouth daily.    07/22/2018 at Unknown time  . lamoTRIgine (LAMICTAL) 100 MG tablet Take 100 mg by mouth 2 (two) times daily.   07/22/2018 at am  . lidocaine (LIDODERM) 5 % Place 1 patch onto the skin daily as needed (pain). Remove & Discard patch within 12 hours or as directed by MD (APPLIED TO PATIENT BACK FOR PAIN)   Past Month at Unknown time  . losartan (COZAAR) 100 MG tablet Take 100 mg by mouth daily.   07/22/2018 at Unknown time  . memantine (NAMENDA) 10 MG tablet Take 1 tablet (10 mg total) by mouth 2 (two) times daily. 180 tablet 3 07/22/2018 at am  . metoprolol succinate (TOPROL-XL) 50 MG 24 hr tablet Take 50 mg by mouth daily. Take with or immediately following a meal.    07/22/2018 at Unknown time  . pantoprazole (PROTONIX) 40 MG tablet Take 40 mg by mouth daily.    07/22/2018 at Unknown time  . potassium chloride (K-DUR) 10 MEQ tablet Take 10 mEq by mouth 2 (two) times daily.  2 07/22/2018 at am  . nitroGLYCERIN (NITROSTAT) 0.4 MG SL tablet Place 1 tablet under the tongue every 5 (five) minutes as needed for chest pain.    Taking   Scheduled: . aspirin EC  81 mg Oral Daily  . diphenhydrAMINE  12.5 mg Intravenous STAT  . donepezil  10 mg Oral BID  . enoxaparin (LOVENOX) injection  40 mg Subcutaneous Q24H  . insulin aspart  0-9 Units Subcutaneous Q6H  . insulin glargine  10 Units Subcutaneous QHS  . lactulose  20 g Oral Daily  . lamoTRIgine  100 mg Oral BID  . lidocaine  1 patch Transdermal Q24H  . memantine  10 mg Oral BID  . metoprolol succinate  50 mg Oral Daily  . pantoprazole  40 mg Oral QHS    Assessment: 63 y.o male presenting with acute encephalopathy of unknown etiology in the setting of FUO and probable pneumonia. Initial concerns for CNS infection and started on empiric treatment for possible HSV encephalitis and or meningitis. However work up thus far including initial MRI brain on 07/23/2018 negative for acute intracranial process. Repeat MRI brain 12/7 also negative.  X-ray showed suspected subtle infiltrate in the right upper lung and left base. Currently on abx. EEG  Showed diffuse slowing otherwise no epileptiform discharges noted. He remains on Lamictal 200 mg BID. CSF studies thus far showed slightly elevated WBC and protein. Labs: Utox neg, Flu neg, HIV neg, RPR neg, TSH neg, UA negative. Patient remains encephalopathic however mental status slightly improved, expected to lag behind in a patient with hx of underlying neurodegenerative disorder.  Recommendations 1. Continue Acyclovir pending HSV results 2. Continue Lamictal 3. Repeat EEG pending 4. Agree with ID recommendations to d/c vanc and Ampicillin given CSF studies negative for meningitis.  5. Agree with current medical management  And correction of metabolic derangements.  This patient was staffed with Dr. Arnaldo Natal who personally evaluated patient, reviewed documentation and agreed with assessment and plan of  care as above.  [name], DNP, FNP-BC Board certified Nurse Practitioner Neurology Department    LOS: 5 days   07/28/2018  2:20 PM

## 2018-07-29 ENCOUNTER — Inpatient Hospital Stay: Payer: PPO

## 2018-07-29 DIAGNOSIS — J189 Pneumonia, unspecified organism: Secondary | ICD-10-CM

## 2018-07-29 DIAGNOSIS — R509 Fever, unspecified: Secondary | ICD-10-CM

## 2018-07-29 LAB — BLOOD GAS, ARTERIAL
ACID-BASE DEFICIT: 1.5 mmol/L (ref 0.0–2.0)
Bicarbonate: 24.3 mmol/L (ref 20.0–28.0)
FIO2: 0.4
MECHVT: 500 mL
Mechanical Rate: 16
O2 Saturation: 97.3 %
PATIENT TEMPERATURE: 37
PEEP/CPAP: 5 cmH2O
RATE: 16 resp/min
pCO2 arterial: 44 mmHg (ref 32.0–48.0)
pH, Arterial: 7.35 (ref 7.350–7.450)
pO2, Arterial: 98 mmHg (ref 83.0–108.0)

## 2018-07-29 LAB — GLUCOSE, CAPILLARY
GLUCOSE-CAPILLARY: 223 mg/dL — AB (ref 70–99)
Glucose-Capillary: 193 mg/dL — ABNORMAL HIGH (ref 70–99)
Glucose-Capillary: 198 mg/dL — ABNORMAL HIGH (ref 70–99)
Glucose-Capillary: 234 mg/dL — ABNORMAL HIGH (ref 70–99)
Glucose-Capillary: 288 mg/dL — ABNORMAL HIGH (ref 70–99)
Glucose-Capillary: 291 mg/dL — ABNORMAL HIGH (ref 70–99)

## 2018-07-29 LAB — COMPREHENSIVE METABOLIC PANEL
ALT: 17 U/L (ref 0–44)
AST: 19 U/L (ref 15–41)
Albumin: 2.9 g/dL — ABNORMAL LOW (ref 3.5–5.0)
Alkaline Phosphatase: 141 U/L — ABNORMAL HIGH (ref 38–126)
Anion gap: 10 (ref 5–15)
BUN: 9 mg/dL (ref 8–23)
CO2: 27 mmol/L (ref 22–32)
Calcium: 8.4 mg/dL — ABNORMAL LOW (ref 8.9–10.3)
Chloride: 99 mmol/L (ref 98–111)
Creatinine, Ser: 0.79 mg/dL (ref 0.61–1.24)
GFR calc Af Amer: >60 mL/min (ref >60)
GFR calc non Af Amer: >60 mL/min (ref >60)
Glucose, Bld: 211 mg/dL — ABNORMAL HIGH (ref 70–99)
Potassium: 3.9 mmol/L (ref 3.5–5.1)
Sodium: 136 mmol/L (ref 135–145)
TOTAL PROTEIN: 6.5 g/dL (ref 6.5–8.1)
Total Bilirubin: 0.7 mg/dL (ref 0.3–1.2)

## 2018-07-29 LAB — URINALYSIS, COMPLETE (UACMP) WITH MICROSCOPIC
Bilirubin Urine: NEGATIVE
Glucose, UA: NEGATIVE mg/dL
Ketones, ur: 5 mg/dL — AB
Leukocytes, UA: NEGATIVE
Nitrite: NEGATIVE
Protein, ur: NEGATIVE mg/dL
Specific Gravity, Urine: 1.005 (ref 1.005–1.030)
pH: 6 (ref 5.0–8.0)

## 2018-07-29 LAB — PROCALCITONIN: PROCALCITONIN: 0.33 ng/mL

## 2018-07-29 LAB — HSV(HERPES SMPLX VRS)ABS-I+II(IGG)-CSF: HSV Type I/II Ab, IgG CSF: 1.33 IV — ABNORMAL HIGH (ref <=0.89)

## 2018-07-29 LAB — MAGNESIUM: Magnesium: 1.9 mg/dL (ref 1.7–2.4)

## 2018-07-29 LAB — PHOSPHORUS: Phosphorus: 2 mg/dL — ABNORMAL LOW (ref 2.5–4.6)

## 2018-07-29 LAB — MRSA PCR SCREENING: MRSA by PCR: NEGATIVE

## 2018-07-29 MED ORDER — CHLORHEXIDINE GLUCONATE 0.12% ORAL RINSE (MEDLINE KIT)
15.0000 mL | Freq: Two times a day (BID) | OROMUCOSAL | Status: DC
  Administered 2018-07-29 – 2018-08-15 (×31): 15 mL via OROMUCOSAL

## 2018-07-29 MED ORDER — SODIUM CHLORIDE 0.9 % IV SOLN
1.0000 g | Freq: Three times a day (TID) | INTRAVENOUS | Status: DC
  Administered 2018-07-29 – 2018-07-30 (×3): 1 g via INTRAVENOUS
  Filled 2018-07-29 (×6): qty 1

## 2018-07-29 MED ORDER — SODIUM CHLORIDE 0.9 % IV BOLUS
1000.0000 mL | Freq: Once | INTRAVENOUS | Status: AC
  Administered 2018-07-29: 1000 mL via INTRAVENOUS

## 2018-07-29 MED ORDER — FENTANYL 2500MCG IN NS 250ML (10MCG/ML) PREMIX INFUSION
0.0000 ug/h | INTRAVENOUS | Status: DC
  Administered 2018-07-29 (×2): 300 ug/h via INTRAVENOUS
  Administered 2018-07-30: 150 ug/h via INTRAVENOUS
  Administered 2018-07-31: 175 ug/h via INTRAVENOUS
  Administered 2018-08-01 – 2018-08-02 (×2): 25 ug/h via INTRAVENOUS
  Filled 2018-07-29 (×3): qty 250

## 2018-07-29 MED ORDER — ORAL CARE MOUTH RINSE
15.0000 mL | OROMUCOSAL | Status: DC
  Administered 2018-07-29 – 2018-08-14 (×150): 15 mL via OROMUCOSAL

## 2018-07-29 MED ORDER — SODIUM PHOSPHATES 45 MMOLE/15ML IV SOLN
20.0000 mmol | Freq: Once | INTRAVENOUS | Status: AC
  Administered 2018-07-29: 20 mmol via INTRAVENOUS
  Filled 2018-07-29: qty 6.67

## 2018-07-29 MED ORDER — ALBUTEROL SULFATE (2.5 MG/3ML) 0.083% IN NEBU: 2.5000 mg | INHALATION_SOLUTION | Freq: Four times a day (QID) | RESPIRATORY_TRACT | Status: DC | PRN

## 2018-07-29 MED ORDER — ETOMIDATE 2 MG/ML IV SOLN
0.3000 mg/kg | Freq: Once | INTRAVENOUS | Status: AC
  Administered 2018-07-29: 20.74 mg via INTRAVENOUS

## 2018-07-29 MED ORDER — VANCOMYCIN HCL 10 G IV SOLR
1500.0000 mg | Freq: Two times a day (BID) | INTRAVENOUS | Status: DC
  Administered 2018-07-29 (×2): 1500 mg via INTRAVENOUS
  Filled 2018-07-29 (×6): qty 1500

## 2018-07-29 MED ORDER — FENTANYL 2500MCG IN NS 250ML (10MCG/ML) PREMIX INFUSION
INTRAVENOUS | Status: AC
  Administered 2018-07-29: 19:00:00
  Filled 2018-07-29: qty 250

## 2018-07-29 MED ORDER — IPRATROPIUM-ALBUTEROL 0.5-2.5 (3) MG/3ML IN SOLN
3.0000 mL | RESPIRATORY_TRACT | Status: DC
  Administered 2018-07-29 – 2018-08-04 (×36): 3 mL via RESPIRATORY_TRACT
  Filled 2018-07-29 (×37): qty 3

## 2018-07-29 MED ORDER — ETOMIDATE 2 MG/ML IV SOLN
INTRAVENOUS | Status: AC
  Administered 2018-07-29: 20 mg
  Filled 2018-07-29: qty 10

## 2018-07-29 MED ORDER — NOREPINEPHRINE 4 MG/250ML-% IV SOLN
0.0000 ug/min | INTRAVENOUS | Status: DC
  Filled 2018-07-29: qty 250

## 2018-07-29 MED ORDER — METHYLPREDNISOLONE SODIUM SUCC 125 MG IJ SOLR
60.0000 mg | INTRAMUSCULAR | Status: DC
  Administered 2018-07-29 – 2018-07-30 (×2): 60 mg via INTRAVENOUS
  Filled 2018-07-29 (×2): qty 2

## 2018-07-29 MED ORDER — NOREPINEPHRINE BITARTRATE 1 MG/ML IV SOLN
0.0000 ug/min | INTRAVENOUS | Status: DC
  Administered 2018-07-29: 5 ug/min via INTRAVENOUS
  Filled 2018-07-29: qty 4

## 2018-07-29 MED ORDER — MIDAZOLAM HCL 2 MG/2ML IJ SOLN
INTRAMUSCULAR | Status: AC
  Filled 2018-07-29: qty 2

## 2018-07-29 NOTE — Progress Notes (Signed)
[facility] NAME: Maurice Patterson    MR#:  [id number]  DATE OF BIRTH:  [birth year]  SUBJECTIVE:  CHIEF COMPLAINT:  No chief complaint on file.  -Withdrawing to pain.  Opening eyes, .  Not following commands -He had high-grade fever last night and also have increased respiratory rate today.  REVIEW OF SYSTEMS:  Review of Systems  Unable to perform ROS: Mental status change    DRUG ALLERGIES:   Allergies  Allergen Reactions  . Hydrocodone Anaphylaxis  . Morphine Other (See Comments)    Loss of memory  . Ambien [Zolpidem] Other (See Comments)    delirium    . Brilinta [Ticagrelor] Other (See Comments)    Stroke   . Flexeril [Cyclobenzaprine] Other (See Comments)    delerium   . Flunitrazepam Other (See Comments)    ROHYPNOL (hallucinations)  . Haldol [Haloperidol Lactate] Other (See Comments)    delerium   . Levetiracetam Diarrhea and Other (See Comments)    Unable to walk  . Lorazepam Hives  . Risperdal [Risperidone] Other (See Comments)    Delirium   . Trazodone Other (See Comments)    Delirium Can take in low doses   . Benadryl [Diphenhydramine Hcl (Sleep)] Rash  . Penicillins Rash    Mouth ulcers Has patient had a PCN reaction causing immediate rash, facial/tongue/throat swelling, SOB or lightheadedness with hypotension: Yes Has patient had a PCN reaction causing severe rash involving mucus membranes or skin necrosis: No Has patient had a PCN reaction that required hospitalization No Has patient had a PCN reaction occurring within the last 10 years: Yes If all of the above answers are "NO", then may proceed with Cephalosporin use.    VITALS:  Blood pressure (!) 170/95, pulse (!) 115, temperature 100.1 F (37.8 C), temperature source Oral, resp. rate 18, height 5\' 8"  (1.727 m), weight 79.4 kg, SpO2 93 %.  PHYSICAL EXAMINATION:  Physical Exam  GENERAL:  62 y.o.-year-old patient lying in the bed with no  acute distress.  EYES: Pupils equal, round, reactive to light and accommodation. No scleral icterus. Extraocular muscles intact.  HEENT: Head atraumatic, normocephalic. Oropharynx and nasopharynx clear. Dry mucus membranes NECK:  Supple, no jugular venous distention. No thyroid enlargement, no tenderness.  LUNGS: Normal breath sounds bilaterally, increased rate, no wheezing, some crepitation.  Positive use of accessory muscles of respiration. Decreased bibasilar breath sounds.  Over the mouth mask present due to open mouth breathing. CARDIOVASCULAR: S1, S2 normal. No murmurs, rubs, or gallops.  ABDOMEN: Soft, nontender, nondistended. Bowel sounds present. No organomegaly or mass.  EXTREMITIES: No pedal edema, cyanosis, or clubbing.  NEUROLOGIC:  No obvious facial droop- lethargic, stiffer left upper extremity, opening eyes to tactile stimulation and speaking- but incomprehensible words PSYCHIATRIC:   Not following commands.  Withdrawing to touch. SKIN: No obvious rash, lesion, or ulcer.    LABORATORY PANEL:   CBC Recent Labs  Lab 07/28/18 0519  WBC 10.5  HGB 13.6  HCT 40.7  PLT 301   ------------------------------------------------------------------------------------------------------------------  Chemistries  Recent Labs  Lab 07/29/18 0324  NA 136  K 3.9  CL 99  CO2 27  GLUCOSE 211*  BUN 9  CREATININE 0.79  CALCIUM 8.4*  MG 1.9  AST 19  ALT 17  ALKPHOS 141*  BILITOT 0.7   ------------------------------------------------------------------------------------------------------------------  Cardiac Enzymes Recent Labs  Lab 07/22/18 2239  TROPONINI <0.03   ------------------------------------------------------------------------------------------------------------------  RADIOLOGY:  Dg Chest 1 View  Result Date: 07/29/2018 CLINICAL DATA:  62 year old male with community-acquired pneumonia EXAM: CHEST  1 VIEW COMPARISON:  Prior chest x-ray 07/28/2018 FINDINGS:  Cardiac and mediastinal contours are within normal limits. Atherosclerotic calcifications present in the transverse aorta. A metallic stent projects over the left heart. Patchy bibasilar airspace opacities are new over the past 24 hours. Suspect trace left pleural effusion. Remote healed right-sided rib fractures. No acute osseous abnormality. No evidence of pneumothorax. IMPRESSION: 1. New bibasilar patchy airspace opacities. Differential considerations include bilateral pneumonia versus bibasilar atelectasis. 2. Suspect small left pleural effusion. 3.  Aortic Atherosclerosis (ICD10-170.0). Electronically Signed   By: Jacqulynn Cadet M.D.   On: 07/29/2018 12:30   Dg Chest 2 View  Result Date: 07/28/2018 CLINICAL DATA:  Hypoxia EXAM: CHEST - 2 VIEW COMPARISON:  07/22/2018 FINDINGS: Cardiac shadow is stable. The lungs are well aerated bilaterally. Previously seen infiltrative changes have nearly completely resolved. No new infiltrates are seen. Old rib fractures are noted on the right. No new bony abnormality is seen. IMPRESSION: Near complete resolution of previously seen infiltrates. Electronically Signed   By: Inez Catalina M.D.   On: 07/28/2018 08:00   Mr Jeri Cos RJ Contrast  Result Date: 07/28/2018 CLINICAL DATA:  Initial evaluation for progressive altered mental status with history of dementia, evaluate for possible meningitis. EXAM: MRI HEAD WITHOUT AND WITH CONTRAST TECHNIQUE: Multiplanar, multiecho pulse sequences of the brain and surrounding structures were obtained without and with intravenous contrast. CONTRAST:  7 cc of Gadavist. COMPARISON:  Prior CT from 07/26/2018. FINDINGS: Brain: Examination moderately to severely degraded by motion artifact. Generalized age-related cerebral atrophy. Probable tiny remote right cerebellar infarct noted. No abnormal foci of restricted diffusion to suggest acute or subacute ischemia. Gray-white matter differentiation maintained. No encephalomalacia to suggest  chronic cortical infarction. No definite abnormal foci of susceptibility artifact to suggest acute or chronic intracranial hemorrhage. No mass lesion, midline shift or mass effect. No hydrocephalus. No extra-axial fluid collection. No definite abnormal enhancement or changes to suggest meningitis. Pituitary gland grossly within normal limits. Vascular: Major intracranial vascular flow voids maintained at the skull base. Skull and upper cervical spine: Craniocervical junction within normal limits. No focal marrow replacing lesion. Scalp soft tissues unremarkable. Sinuses/Orbits: Globes and orbital soft tissues within normal limits. Paranasal sinuses are largely clear. Small bilateral mastoid effusions noted. Inner ear structures grossly normal. Other: None. IMPRESSION: 1. Technically limited examination due to extensive motion artifact. 2. No definite acute intracranial abnormality. No overt evidence for meningitis. 3. Mild age-related parenchymal volume loss. Electronically Signed   By: Jeannine Boga M.D.   On: 07/28/2018 06:41    EKG:   Orders placed or performed during the hospital encounter of 07/22/18  . EKG 12-Lead  . EKG 12-Lead  . EKG 12-Lead  . EKG 12-Lead  . ED EKG  . ED EKG    ASSESSMENT AND PLAN:   62 year old male with past medical history significant for CAD, CHF, CKD, dementia, diabetes, hypertension and seizure disorder presents to hospital secondary to worsening weakness and poor appetite  *  Community-acquired pneumonia-blood cultures are negative. -On antibiotics -Clinically no improvement noted. -Remains on room air-using a tent mask for mouth moisturization as patient is a mouth breather -On ceftriaxone his chest x-ray was better on July 28, 2018.  *Aspiration pneumonia  07/29/18-had fever and worsening on chest x-ray. Started on broad-spectrum antibiotics and send repeat blood cultures. Due to altered mental status he is at high risk of recurrent aspirations.  Discussed with his wife about this. Will start on chest physical therapy and nebulizer treatment for now and also discussed with wife about high risk of recurrent aspiration leading to respiratory distress and possible intubation and ventilatory support.  She would discuss with her daughter and let us know about their final decision about ventilator support.  Currently she does not feel he would like him to be on ventilator for long.   *  Acute metabolic encephalopathy-CT of the head is negative.   -Possible acute encephalitis.  Prior history of HSV encephalitis. -Lumbar puncture done, no evidence of bacterial meningitis.  Normal WBC and elevated glucose and protein. -HSV PCR tests are pending. (Was not sent initially when LP was done.  Spoke to micro lab and they are sending today.) -ABG with no CO2 retention.  Ammonia is borderline elevated at 38. -MRI of the brain repeated  Twice did not show any acute findings.  Has mild to moderate cerebral atrophy. -No history of seizures.   . -Gram stain is negative.  Cultures on CSF is negative. -Vancomycin and ampicillin are discontinued now.  ID consult is appreciated -Continue acyclovir.  Also Rocephin will be continued for his pneumonia. -Neurology consulted -Initial EEG without seizure activity but generalized slowing.  Repeat EEG - no seizure like activities.  3.  Hypokalemia- replaced.  4.  Diabetes mellitus-on Lantus and sliding scale insulin.  as remains lethargic and unable to eat- continue low dose of Lantus.  5.  History of seizure disorder/myoclonic jerks-on Lamictal. -According to wife, patient does not have any history of seizures -EEG with generalized slowing, no seizure-like activity.  Empirically Vimpat was added by neurology  6.  DVT prophylaxis-on Lovenox   7. Nutrition- Patient has been NPO for 5 days now- -unable to get PICC line as he was very stiff and is allergic to all sedating medications.  Will hold off today and see if  he improves mentally and we can avoid peripheral nutrition.  Follow-up again tomorrow. -Not a candidate for NG tube as patient gets agitated easily and will pull it out -Due to aspiration and new pneumonia, he may have worsening in his condition. -I again discussed today with his wife about making decisions regarding his further care.   Updated wife and sister today. -Palliative care was consulted, however patient's wife does not want to talk to palliative care.   All the records are reviewed and case discussed with Care Management/Social Workerr. Management plans discussed with the patient, family and they are in agreement.  CODE STATUS: Full Code  TOTAL TIME TAKING CARE OF THIS PATIENT: 45 minutes.   POSSIBLE D/C IN 2 DAYS, DEPENDING ON CLINICAL CONDITION.   Vaughan Basta M.D on 07/29/2018 at 3:04 PM  Between 7am to 6pm - Pager - [phone]  After 6pm go to [email or web address] - password [email or web address] [facility]  Office  [phone]  CC: Primary care physician; [name], MD

## 2018-07-29 NOTE — Progress Notes (Signed)
   07/29/18 2100  Clinical Encounter Type  Visited With Family  Visit Type Follow-up   After checking on the patient, Chaplain chatted with the patient's wife about her concerns for his recovery. She is hopeful, but distrustful. Recent events in her husband's care have led her to be cautious. The dynamics around the patient's care at [facility] are complicated. The family would benefit from a consult with Palliative Care and a conversation with Patient Experience.

## 2018-07-29 NOTE — Progress Notes (Signed)
Family Meeting Note  Advance Directive:no  Today a meeting took place with the spouse and sister.  Patient is unable to participate due QR:FXJOIT capacity Mental status change   The following clinical team members were present during this meeting:MD  The following were discussed:Patient's diagnosis: Dementia, altered mental status, aspiration pneumonia, Patient's progosis: Unable to determine and Goals for treatment: Full Code  I had a long discussion with the patient's wife and sister regarding this mental status change which is recurrent and persistent for a long time for last few years.  I had discussed regarding issues with his nutrition and also inability to protect his airway and high risk for aspiration and worsening in respiratory status.  At one point patient's wife and sister both told me they would not like him to go on the ventilator and life support. Later again they changed and told they will discuss with patient's daughter also and updated the nurse about the CODE STATUS. I have informed them currently patient is a full code and he is at high risk of recurrent aspiration and worsening, in any adverse event like this he might end up getting intubated or on ventilator support and they understand that for now.  Additional follow-up to be provided: Neurology, infectious disease.  Time spent during discussion:20 minutes  Vaughan Basta, MD

## 2018-07-29 NOTE — Progress Notes (Signed)
   07/29/18 1900  Clinical Encounter Type  Visited With Patient;Family  Visit Type Initial;Spiritual support  Referral From Nurse  Recommendations Follow-up periodically  Spiritual Encounters  Spiritual Needs Emotional;Prayer  Stress Factors  Patient Stress Factors Health changes  Family Stress Factors Health changes   Chaplain met the patient's daughters and son as the patient was being moved to the ICU. Later, Chaplain provided spiritual support for the patient's wife, sister, mother, and brother-in-law. Chaplain assembled family, facilitated communication with staff, and offered energetic prayer for the patient, family, and staff.

## 2018-07-29 NOTE — Progress Notes (Signed)
PHARMACY - ADULT TOTAL PARENTERAL NUTRITION CONSULT NOTE   Pharmacy Consult for TPN Indication: patient/family preference/unable to keep NGT in place  Patient Measurements: Height: 5\' 8"  (172.7 cm) Weight: 175 lb (79.4 kg) IBW/kg (Calculated) : 68.4 TPN AdjBW (KG): 77.6 Body mass index is 26.61 kg/m.  Assessment: 62 yo male being worked up for meningitis LP pending, ID and neurology following. Family insistent on nutrition and team concerned patient could not keep NG in place for enteral feeds. RD says this patient is not an ideal candidate for parenteral nutrition but gave recommendations for starting and goals. PICC was to be placed yesterday but due to multiple drug allergies it was not completed. VAS/IV team recommended IR or MD insertion.   GI:  Endo: BG 185-204; Insulin requirements in the past 24 hours: 7 units SSI + Lantus 10 units Lytes: K 3.9, Mag 1.9, Phos 2.0, Ca 8.4, Alb 2.9, CorrCa 9.3 Renal: CrCl 93.8 ml/min Pulm: Cards:  Hepatobil: Neuro: ID:  TPN Access: pending TPN start date: pending Nutritional Goals (per RD recommendation on 07/27/2018): Kcal:  1845-2150 (MSJ x 1.2-1.4) Protein:  90-105 grams (1.2-1.4 grams/kg) Fluid:  1.8-2.1 L/day (1 mL/kcal)  Goal TPN rate is 83 ml/hr (provides nutrition KCal: 1894, Protein: 100 g, Fluid: 1992 mL+ 240 mL from lipids)  Current Nutrition:   Plan: Phos 2.0 - supplement with NaPhos 20 mmol IV x1  (as pt with K almost 4 and has fluids with KCl)  No PICC line today due to fever and worsening status; holding off on TPN for today - discussed with hospitalist. (orders d/c'd for now)  Follow on 12/8 for possible initiation Clinimix E 5/15 TPN at 40 mL/hr. Follow for starting ILE 20% at 20 mL/hr over 12 hours  Add MVI, trace elements will need to be added to TPN Start sensitive SSI and adjust as needed Decrease IVMF (D5, NS, etc.) by half at initiation Monitor TPN labs per protocol F/U 07/30/2018  Rocky Morel, PharmD,  BCPS Clinical Pharmacist 07/29/2018,8:24 AM

## 2018-07-29 NOTE — Progress Notes (Signed)
This nurse has been working with Nira Conn RT regarding patient breathing status today. This nurse along with RT and charge nurse called Katie RN in CCU to see if she would come and take a look at him. Joellen Jersey confirms need for elevated care and he has been transported to CCU.

## 2018-07-29 NOTE — Progress Notes (Signed)
Patient ID: Maurice Patterson, male   DOB: 1956-01-04, 62 y.o.   MRN: [id number] Pulmonary/critical care attending  Procedure note Central line placement Indication: Hypotensive requiring pressors Consent is obtained from wife Left and right IJ were small vessels sitting on top of the carotid Right femoral location chosen Complete contact barrier precautions utilized Topical Hibiclens Under direct visualization the femoral vein was cannulated without difficulty The guidewire was placed and the needle removed Using the Seldinger technique a triple-lumen catheter was placed without difficulty The guidewire was removed intact All 3 ports flushed, dark nonpulsatile venous return Sutured into place Dressed by nurse  Hermelinda Dellen, DO

## 2018-07-29 NOTE — Progress Notes (Addendum)
At roughly 10:50 this a.m. Went to patient's room to discuss with wife her concerns of patient respiratory care. She felt like his needs were not being met  I explained I had taken care of him the day before and had been given him CPT throughout the day except for his last treatment due to him being at a procedure.  She stated that she remember coming to the room and asking if he had returned and she stated no.  I also stated that therapy had been stopped due to the previous chest Xray that had been done the day before showing near resolution of his infiltrates.  That I had come by earlier in the shift to check on him to make sure that his face tent was appropriately attached and filled with water and working.  She became very agitated and asked that I leave the room and not return. Herma Ard, Rn was in the room during  this conversation. Pt wife later apologized and stated that it would be fine for me to return if new orders were written for Respiratory.   New orders were written and I returned to the room around 15:25  The wife was very cordial and apologized again. Wife stated she needed to leave and introduced me to daughter in the room.   I performed a breathing treatment all the while noticing that the patient's breathing was looking worse to me.  After treatment I spoke with pt's Rn and charge RN who I asked to call Joellen Jersey, CCU charge RN.  Patient moved to CCU shortly after decision was made by Joellen Jersey, RN.

## 2018-07-29 NOTE — Progress Notes (Signed)
Patient was seen again due to rapid response and if required to be transferred to ICU. He continued to have worsening in respiratory distress.  Review of system-due to acuity of mental status he cannot give review of system.  Physical exam Patient opens eyes to stimuli but does not communicate well. Significant respiratory distress with use of accessory muscles of respiration. On auscultation there is some crackling bilateral lungs. Critical appearing.  Assessment and plan  *Acute respiratory failure Aspiration pneumonia Altered mental status and unable to protect airway  Patient is on broad-spectrum antibiotics. Transfer to ICU, spoke to ICU physician.  Started on BiPAP for now.  I had also discussed with patient's daughter who was present in the room. She requested not to intubate the patient right now as he would have never wanted to stay on the ventilator machine.  She understands that if patient goes on the ventilatory support he may not be able to come off due to his mental status changes. As per daughter, patient's wife and son are on the way to come back to the hospital.  I had talked to them just 2 hours ago about high probability of something like this happening. As soon as family members arise, they are going to meet with ICU physician to discuss the goals of care and making decision about intubation and ventilatory support.  His overall prognosis is very poor with poor quality of life.  I had encouraged family to make decision as soon as possible.  Critical care time spent 30 minutes.

## 2018-07-29 NOTE — Progress Notes (Signed)
[facility]FECT[facility] PROGRESS NOTE Date of Admission:  07/22/2018     ID: Maurice Patterson is a 62 y.o. male with pneumonia and AMS  Principal Problem:   CAP (community acquired pneumonia) Active Problems:   Uncontrolled type 2 diabetes mellitus with hyperglycemia, with long-term current use of insulin (HCC)   Essential hypertension   Alzheimer's dementia ([place])   GERD (gastroesophageal reflux disease)   Subjective: High fever last night and now some resp distress, still minimally responsive, but opens eyes to his name. Wife and sister at bedside.  ROS  Unable to obtain  Medications:  Antibiotics Given (last 72 hours)    Date/Time Action Medication Dose Rate   07/26/18 2131 New Bag/Given   ampicillin (OMNIPEN) 2 g in sodium chloride 0.9 % 100 mL IVPB 2 g 300 mL/hr   07/26/18 2200 New Bag/Given   cefTRIAXone (ROCEPHIN) 2 g in sodium chloride 0.9 % 100 mL IVPB 2 g 200 mL/hr   07/26/18 2346 New Bag/Given   acyclovir (ZOVIRAX) 700 mg in dextrose 5 % 100 mL IVPB 700 mg 114 mL/hr   07/26/18 2350 New Bag/Given   ampicillin (OMNIPEN) 2 g in sodium chloride 0.9 % 100 mL IVPB 2 g 300 mL/hr   07/27/18 0321 New Bag/Given   ampicillin (OMNIPEN) 2 g in sodium chloride 0.9 % 100 mL IVPB 2 g 300 mL/hr   07/27/18 0551 New Bag/Given   vancomycin (VANCOCIN) IVPB 1000 mg/200 mL premix 1,000 mg 200 mL/hr   07/27/18 0917 New Bag/Given   ampicillin (OMNIPEN) 2 g in sodium chloride 0.9 % 100 mL IVPB 2 g 300 mL/hr   07/27/18 1135 New Bag/Given   cefTRIAXone (ROCEPHIN) 2 g in sodium chloride 0.9 % 100 mL IVPB 2 g 200 mL/hr   07/27/18 1514 New Bag/Given   ampicillin (OMNIPEN) 2 g in sodium chloride 0.9 % 100 mL IVPB 2 g 300 mL/hr   07/27/18 1814 New Bag/Given   vancomycin (VANCOCIN) IVPB 1000 mg/200 mL premix 1,000 mg 200 mL/hr   07/27/18 2004 New Bag/Given   acyclovir (ZOVIRAX) 700 mg in dextrose 5 % 100 mL IVPB 700 mg 114 mL/hr   07/28/18 0539 New Bag/Given   acyclovir (ZOVIRAX) 700  mg in dextrose 5 % 100 mL IVPB 700 mg 114 mL/hr   07/28/18 1125 New Bag/Given   cefTRIAXone (ROCEPHIN) 1 g in sodium chloride 0.9 % 100 mL IVPB 1 g 200 mL/hr   07/28/18 1634 New Bag/Given   acyclovir (ZOVIRAX) 700 mg in dextrose 5 % 100 mL IVPB 700 mg 114 mL/hr   07/29/18 0133 New Bag/Given   acyclovir (ZOVIRAX) 700 mg in dextrose 5 % 100 mL IVPB 700 mg 114 mL/hr   07/29/18 0705 New Bag/Given   vancomycin (VANCOCIN) 1,500 mg in sodium chloride 0.9 % 500 mL IVPB 1,500 mg 250 mL/hr     . aspirin EC  81 mg Oral Daily  . donepezil  10 mg Oral BID  . enoxaparin (LOVENOX) injection  40 mg Subcutaneous Q24H  . insulin aspart  0-9 Units Subcutaneous Q6H  . insulin glargine  10 Units Subcutaneous QHS  . lactulose  20 g Oral Daily  . lamoTRIgine  100 mg Oral BID  . lidocaine  1 patch Transdermal Q24H  . memantine  10 mg Oral BID  . metoprolol succinate  50 mg Oral Daily  . pantoprazole  40 mg Oral QHS    Objective: Vital signs in last 24 hours: Temp:  [100.1 F (37.8  C)-102.1 F (38.9 C)] 100.1 F (37.8 C) (12/07 0809) Pulse Rate:  [110-116] 115 (12/07 0809) Resp:  [16-20] 18 (12/07 0000) BP: (159-186)/(73-95) 170/95 (12/07 0809) SpO2:  [91 %-100 %] 93 % (12/07 0809) Weight:  [79.4 kg] 79.4 kg (12/07 0600) Physical Exam  Constitutional: lethargic, minimally responsive but opens eyes to voice, some resp distress, on O2 HENT: aniceric, mild aniscoria but reactive Mouth/Throat: Oropharynx is dry, poor dentition Cardiovascular: Normal rate, regular rhythm and normal heart sounds.  Pulmonary/Chest: bil rhonchi and poor air movement Abdominal: Soft. Bowel sounds are normal. He exhibits no distension. There is no tenderness.  Lymphadenopathy: He has no cervical adenopathy.  Neurological: lethargic, minimally responsive but opens eyes to voice. Some rigidity on upper extremity exam. Skin: Skin is warm and dry. No rash noted. No erythema.  Psychiatric: min responsive Ext trace edema  bil  Lab Results Recent Labs    07/27/18 0505 07/28/18 0519 07/29/18 0324  WBC 8.9 10.5  --   HGB 13.8 13.6  --   HCT 41.3 40.7  --   NA 137 135 136  K 3.6 3.8 3.9  CL 98 99 99  CO2 26 26 27   BUN 5* 6* 9  CREATININE 0.53* 0.57* 0.79    Microbiology: Results for orders placed or performed during the hospital encounter of 07/22/18  Urine culture     Status: None   Collection Time: 07/22/18 11:03 PM  Result Value Ref Range Status   Specimen Description   Final    URINE, RANDOM Performed at [facility], [address], [place], [place] [address]    Special Requests   Final    NONE Performed at [facility], [address], [place], [place] [address]    Culture   Final    NO GROWTH Performed at [facility], [place] [address], [place], [place] [address]    Report Status 07/24/2018 FINAL  Final  CULTURE, BLOOD (ROUTINE X 2) w Reflex to ID Panel     Status: None   Collection Time: 07/23/18 10:06 AM  Result Value Ref Range Status   Specimen Description BLOOD BLOOD LEFT ARM  Final   Special Requests   Final    BOTTLES DRAWN AEROBIC AND ANAEROBIC Blood Culture adequate volume   Culture   Final    NO GROWTH 5 DAYS Performed at [facility], [address] [place], [place] [address]    Report Status 07/28/2018 FINAL  Final  CULTURE, BLOOD (ROUTINE X 2) w Reflex to ID Panel     Status: None   Collection Time: 07/23/18 10:17 AM  Result Value Ref Range Status   Specimen Description BLOOD BLOOD LEFT WRIST  Final   Special Requests   Final    BOTTLES DRAWN AEROBIC AND ANAEROBIC Blood Culture adequate volume   Culture   Final    NO GROWTH 5 DAYS Performed at [facility], [address], [place],  [address]    Report Status 07/28/2018 FINAL  Final  Gram stain     Status: None   Collection Time: 07/26/18  3:11 PM  Result Value Ref Range Status   Specimen Description CSF  Final   Special Requests NONE  Final   Gram Stain    Final    NO ORGANISMS SEEN WBC SEEN Performed at [facility], [address] [place],  [address]    Report Status 07/26/2018 FINAL  Final  CSF culture  Status: None (Preliminary result)   Collection Time: 07/26/18  3:11 PM  Result Value Ref Range Status   Specimen Description   Final    CSF Performed at [facility], [address] [place], [place] [address]    Special Requests   Final    NONE Performed at [facility], [address] [place], [place] [address]    Gram Stain   Final    NO ORGANISMS SEEN WBC SEEN NO RBC SEEN Performed at [facility], [address] [place], [place] [address]    Culture   Final    NO GROWTH 3 DAYS Performed at [facility], [place] [address], [place], [place] [address]    Report Status PENDING  Incomplete  Fungus Culture With Stain     Status: None (Preliminary result)   Collection Time: 07/26/18  3:11 PM  Result Value Ref Range Status   Fungus Stain Final report  Final    Comment: (NOTE) Performed At: [facility] [place], [place] [id number] Rush Farmer MD GY:[id number]    Fungus (Mycology) Culture PENDING  Incomplete   Fungal Source CSF  Final    Comment: Performed at [facility], [address] [place], [place] [address]  Fungus Culture Result     Status: None   Collection Time: 07/26/18  3:11 PM  Result Value Ref Range Status   Result 1 Comment  Final    Comment: (NOTE) KOH/Calcofluor preparation:  no fungus observed. Performed At: [facility] [place], [place] [id number] Rush Farmer MD JI:[id number]     Studies/Results: Dg Chest 2 View  Result Date: 07/28/2018 CLINICAL DATA:  Hypoxia EXAM: CHEST - 2 VIEW COMPARISON:  07/22/2018 FINDINGS: Cardiac shadow is stable. The lungs are well aerated bilaterally. Previously seen infiltrative changes have nearly completely resolved. No new infiltrates are seen. Old  rib fractures are noted on the right. No new bony abnormality is seen. IMPRESSION: Near complete resolution of previously seen infiltrates. Electronically Signed   By: Inez Catalina M.D.   On: 07/28/2018 08:00   Mr Jeri Cos BP Contrast  Result Date: 07/28/2018 CLINICAL DATA:  Initial evaluation for progressive altered mental status with history of dementia, evaluate for possible meningitis. EXAM: MRI HEAD WITHOUT AND WITH CONTRAST TECHNIQUE: Multiplanar, multiecho pulse sequences of the brain and surrounding structures were obtained without and with intravenous contrast. CONTRAST:  7 cc of Gadavist. COMPARISON:  Prior CT from 07/26/2018. FINDINGS: Brain: Examination moderately to severely degraded by motion artifact. Generalized age-related cerebral atrophy. Probable tiny remote right cerebellar infarct noted. No abnormal foci of restricted diffusion to suggest acute or subacute ischemia. Gray-white matter differentiation maintained. No encephalomalacia to suggest chronic cortical infarction. No definite abnormal foci of susceptibility artifact to suggest acute or chronic intracranial hemorrhage. No mass lesion, midline shift or mass effect. No hydrocephalus. No extra-axial fluid collection. No definite abnormal enhancement or changes to suggest meningitis. Pituitary gland grossly within normal limits. Vascular: Major intracranial vascular flow voids maintained at the skull base. Skull and upper cervical spine: Craniocervical junction within normal limits. No focal marrow replacing lesion. Scalp soft tissues unremarkable. Sinuses/Orbits: Globes and orbital soft tissues within normal limits. Paranasal sinuses are largely clear. Small bilateral mastoid effusions noted. Inner ear structures grossly normal. Other: None. IMPRESSION: 1. Technically limited examination due to extensive motion artifact. 2. No definite acute intracranial abnormality. No overt evidence for meningitis. 3. Mild age-related parenchymal volume  loss. Electronically Signed  By: Jeannine Boga M.D.   On: 07/28/2018 06:41   [place] Ekg Site Rite  Result Date: 07/27/2018 If Site Rite image not attached, placement could not be confirmed due to current cardiac rhythm.   Assessment/Plan: Maurice Patterson is a 62 y.o. male admitted with confusion, weakness, possible PNA. Remains confused. Has had MRI x 2, CT of brain with nothing acute. Seen by neurology. EEG with diffuse slowing. Utox neg, Flu neg, HIV neg, RPR neg, TSH neg, UA negative, abg unimpressive. CSF with very mild elevated wbc and protein.  I do not think he has meningitis or encephalitis and certainly does not have a bacterial meningitis with such a low wbc. He follows with neurology as otpt for transient memory loss and expressive aphasia in past, hx of stroke and seizures.  I suspect current presentation is multifactorial.  12/7 high fever last night, diaphoretic, remains on O2 with increased resp distress today. Remains lethargic I think he is aspirating  Recommendations Can cont acyclovir pending HSV results - if neg PCR though can dc Query withdrawal symptoms or drug related fever (although he has not been taking any oral meds for several days)  We stopped vanco, and ampicillin on 12/5 but vanco and meropenem added on 12/7 due to fever CXR  Shows suspected aspiration per my read. Start nebs and solumedrol Cont neuro work up.  Discussed with wife and Dr Boyce Medici Thank you very much for the consult. Will follow with you.  Leonel Ramsay   07/29/2018, 11:47 AM

## 2018-07-29 NOTE — Consult Note (Addendum)
Reason for Consult: Respiratory failure Referring Physician: Dr. Dorann Lodge is an 62 y.o. male.  HPI: Called to see patient emergently secondary to deteriorating respiratory status.  Patient is a 62 year old gentleman with an underlying past medical history of congestive heart failure, coronary artery disease, chronic renal failure, diabetes, hypertension, hyperlipidemia, gastroesophageal reflux disease, prior history of pancreatic cancer was brought in by EMS with complaints of weakness and progressive altered mental status.  She was admitted to the hospital with a diagnosis of community-acquired pneumonia and encephalopathy.  Felt to be metabolic in nature, neurology was consulted, negative work-up to include lumbar puncture and brain MRI.  This afternoon patient developed progressive respiratory failure with presumptive aspiration.  He was brought to the intensive care unit and started on BiPAP.  Family is on their way in to discuss goals of care  Past Medical History:  Diagnosis Date  . Acute MI ([place])   . Arthritis   . Back pain   . CAD (coronary artery disease)   . CHF (congestive heart failure) ([place])   . Chronic kidney disease   . Degenerative lumbar disc   . Dementia ([place])   . Diabetes mellitus without complication ([place])   . GERD (gastroesophageal reflux disease)   . Hernia of abdominal cavity   . Hyperlipemia   . Hypertension   . Malignant intraductal papillary mucinous tumor of pancreas ([place])   . Memory loss   . Pancreatitis   . Seizures ([place])    staring spells  . Shingles   . Stroke ([place]) 08/2017  . TIA (transient ischemic attack)     Past Surgical History:  Procedure Laterality Date  . CARDIAC CATHETERIZATION    . CORONARY ANGIOPLASTY    . CYSTOSCOPY WITH STENT PLACEMENT Right 05/13/2017   Procedure: CYSTOSCOPY WITH STENT PLACEMENT;  Surgeon: Nickie Retort, MD;  Location: [facility] ORS;  Service: Urology;  Laterality: Right;  . ERCP N/A 09/12/2015    Procedure: ENDOSCOPIC RETROGRADE CHOLANGIOPANCREATOGRAPHY (ERCP);  Surgeon: Hulen Luster, MD;  Location: [facility] ENDOSCOPY;  Service: Gastroenterology;  Laterality: N/A;  . ERCP N/A 01/02/2016   Procedure: ENDOSCOPIC RETROGRADE CHOLANGIOPANCREATOGRAPHY (ERCP);  Surgeon: Hulen Luster, MD;  Location: [facility] ENDOSCOPY;  Service: Gastroenterology;  Laterality: N/A;  . EXTERNAL FIXATION WRIST FRACTURE Left   . left arm metal plate    . LEFT HEART CATH AND CORONARY ANGIOGRAPHY Left 11/04/2016   Procedure: Left Heart Cath and Coronary Angiography;  Surgeon: Isaias Cowman, MD;  Location: [place] CV LAB;  Service: Cardiovascular;  Laterality: Left;  . Left shoulder surgery    . Stent times 3  2013   Cardiac  . TOTAL ELBOW REPLACEMENT Left   . VASECTOMY    . VENTRICULOPERITONEAL SHUNT      Family History  Problem Relation Age of Onset  . Diabetes Mother   . Hypertension Mother   . CAD Mother   . Hyperlipidemia Mother   . Stroke Mother   . ALS Mother   . Alzheimer's disease Father   . Diabetes Father   . Heart Problems Brother     Social History:  reports that he quit smoking about 6 years ago. He has never used smokeless tobacco. He reports that he does not drink alcohol or use drugs.  Allergies:  Allergies  Allergen Reactions  . Hydrocodone Anaphylaxis  . Morphine Other (See Comments)    Loss of memory  . Ambien [Zolpidem] Other (See Comments)    delirium    .  Brilinta [Ticagrelor] Other (See Comments)    Stroke   . Flexeril [Cyclobenzaprine] Other (See Comments)    delerium   . Flunitrazepam Other (See Comments)    ROHYPNOL (hallucinations)  . Haldol [Haloperidol Lactate] Other (See Comments)    delerium   . Levetiracetam Diarrhea and Other (See Comments)    Unable to walk  . Lorazepam Hives  . Risperdal [Risperidone] Other (See Comments)    Delirium   . Trazodone Other (See Comments)    Delirium Can take in low doses   . Benadryl [Diphenhydramine Hcl (Sleep)] Rash   . Penicillins Rash    Mouth ulcers Has patient had a PCN reaction causing immediate rash, facial/tongue/throat swelling, SOB or lightheadedness with hypotension: Yes Has patient had a PCN reaction causing severe rash involving mucus membranes or skin necrosis: No Has patient had a PCN reaction that required hospitalization No Has patient had a PCN reaction occurring within the last 10 years: Yes If all of the above answers are "NO", then may proceed with Cephalosporin use.    Medications: I have reviewed the patient's current medications.  Results for orders placed or performed during the hospital encounter of 07/22/18 (from the past 48 hour(s))  Glucose, capillary     Status: Abnormal   Collection Time: 07/27/18  4:48 PM  Result Value Ref Range   Glucose-Capillary 120 (H) 70 - 99 mg/dL  Glucose, capillary     Status: Abnormal   Collection Time: 07/27/18  9:09 PM  Result Value Ref Range   Glucose-Capillary 196 (H) 70 - 99 mg/dL  Comprehensive metabolic panel     Status: Abnormal   Collection Time: 07/28/18  5:19 AM  Result Value Ref Range   Sodium 135 135 - 145 mmol/L   Potassium 3.8 3.5 - 5.1 mmol/L   Chloride 99 98 - 111 mmol/L   CO2 26 22 - 32 mmol/L   Glucose, Bld 199 (H) 70 - 99 mg/dL   BUN 6 (L) 8 - 23 mg/dL   Creatinine, Ser 0.57 (L) 0.61 - 1.24 mg/dL   Calcium 8.2 (L) 8.9 - 10.3 mg/dL   Total Protein 6.7 6.5 - 8.1 g/dL   Albumin 2.9 (L) 3.5 - 5.0 g/dL   AST 21 15 - 41 U/L   ALT 18 0 - 44 U/L   Alkaline Phosphatase 149 (H) 38 - 126 U/L   Total Bilirubin 0.9 0.3 - 1.2 mg/dL   GFR calc non Af Amer >60 >60 mL/min   GFR calc Af Amer >60 >60 mL/min   Anion gap 10 5 - 15    Comment: Performed at [facility], [address], [place], [place] [address]  Magnesium     Status: None   Collection Time: 07/28/18  5:19 AM  Result Value Ref Range   Magnesium 1.9 1.7 - 2.4 mg/dL    Comment: Performed at [facility], [address] [place], [place]  [address]  Phosphorus     Status: Abnormal   Collection Time: 07/28/18  5:19 AM  Result Value Ref Range   Phosphorus 2.3 (L) 2.5 - 4.6 mg/dL    Comment: Performed at [facility], [address] [place], [place] [address]  CBC     Status: None   Collection Time: 07/28/18  5:19 AM  Result Value Ref Range   WBC 10.5 4.0 - 10.5 K/uL   RBC 4.67 4.22 - 5.81 MIL/uL   Hemoglobin 13.6 13.0 - 17.0 g/dL   HCT 40.7  39.0 - 52.0 %   MCV 87.2 80.0 - 100.0 fL   MCH 29.1 26.0 - 34.0 pg   MCHC 33.4 30.0 - 36.0 g/dL   RDW 13.3 11.5 - 15.5 %   Platelets 301 150 - 400 K/uL   nRBC 0.0 0.0 - 0.2 %    Comment: Performed at [facility], [address] [place], [place] [address]  Glucose, capillary     Status: Abnormal   Collection Time: 07/28/18  7:22 AM  Result Value Ref Range   Glucose-Capillary 202 (H) 70 - 99 mg/dL   Comment 1 Notify RN   Glucose, capillary     Status: Abnormal   Collection Time: 07/28/18  8:42 AM  Result Value Ref Range   Glucose-Capillary 195 (H) 70 - 99 mg/dL   Comment 1 Notify RN   Glucose, capillary     Status: Abnormal   Collection Time: 07/28/18 12:44 PM  Result Value Ref Range   Glucose-Capillary 185 (H) 70 - 99 mg/dL   Comment 1 Notify RN   Glucose, capillary     Status: Abnormal   Collection Time: 07/28/18  6:12 PM  Result Value Ref Range   Glucose-Capillary 204 (H) 70 - 99 mg/dL   Comment 1 Notify RN   Glucose, capillary     Status: Abnormal   Collection Time: 07/28/18 10:04 PM  Result Value Ref Range   Glucose-Capillary 192 (H) 70 - 99 mg/dL  Glucose, capillary     Status: Abnormal   Collection Time: 07/29/18 12:41 AM  Result Value Ref Range   Glucose-Capillary 198 (H) 70 - 99 mg/dL   Comment 1 Document in Chart   Comprehensive metabolic panel     Status: Abnormal   Collection Time: 07/29/18  3:24 AM  Result Value Ref Range   Sodium 136 135 - 145 mmol/L   Potassium 3.9 3.5 - 5.1 mmol/L   Chloride 99 98 - 111 mmol/L   CO2 27 22 - 32  mmol/L   Glucose, Bld 211 (H) 70 - 99 mg/dL   BUN 9 8 - 23 mg/dL   Creatinine, Ser 0.79 0.61 - 1.24 mg/dL   Calcium 8.4 (L) 8.9 - 10.3 mg/dL   Total Protein 6.5 6.5 - 8.1 g/dL   Albumin 2.9 (L) 3.5 - 5.0 g/dL   AST 19 15 - 41 U/L   ALT 17 0 - 44 U/L   Alkaline Phosphatase 141 (H) 38 - 126 U/L   Total Bilirubin 0.7 0.3 - 1.2 mg/dL   GFR calc non Af Amer >60 >60 mL/min   GFR calc Af Amer >60 >60 mL/min   Anion gap 10 5 - 15    Comment: Performed at [facility], [address] [place], [place] [address]  Magnesium     Status: None   Collection Time: 07/29/18  3:24 AM  Result Value Ref Range   Magnesium 1.9 1.7 - 2.4 mg/dL    Comment: Performed at [facility], [address] [place], [place] [address]  Phosphorus     Status: Abnormal   Collection Time: 07/29/18  3:24 AM  Result Value Ref Range   Phosphorus 2.0 (L) 2.5 - 4.6 mg/dL    Comment: Performed at [facility], [address] [place], [place] [address]  Glucose, capillary     Status: Abnormal   Collection Time: 07/29/18  6:13 AM  Result Value Ref Range   Glucose-Capillary 193 (H) 70 - 99 mg/dL  Procalcitonin -  Baseline     Status: None   Collection Time: 07/29/18  6:16 AM  Result Value Ref Range   Procalcitonin 0.33 ng/mL    Comment:        Interpretation: PCT (Procalcitonin) <= 0.5 ng/mL: Systemic infection (sepsis) is not likely. Local bacterial infection is possible. (NOTE)       Sepsis PCT Algorithm           Lower Respiratory Tract                                      Infection PCT Algorithm    ----------------------------     ----------------------------         PCT < 0.25 ng/mL                PCT < 0.10 ng/mL         Strongly encourage             Strongly discourage   discontinuation of antibiotics    initiation of antibiotics    ----------------------------     -----------------------------       PCT 0.25 - 0.50 ng/mL            PCT 0.10 - 0.25 ng/mL               OR        >80% decrease in PCT            Discourage initiation of                                            antibiotics      Encourage discontinuation           of antibiotics    ----------------------------     -----------------------------         PCT >= 0.50 ng/mL              PCT 0.26 - 0.50 ng/mL               AND        <80% decrease in PCT             Encourage initiation of                                             antibiotics       Encourage continuation           of antibiotics    ----------------------------     -----------------------------        PCT >= 0.50 ng/mL                  PCT > 0.50 ng/mL               AND         increase in PCT                  Strongly encourage                                      initiation of  antibiotics    Strongly encourage escalation           of antibiotics                                     -----------------------------                                           PCT <= 0.25 ng/mL                                                 OR                                        > 80% decrease in PCT                                     Discontinue / Do not initiate                                             antibiotics Performed at [facility], [address] [place], [place] [address]   Glucose, capillary     Status: Abnormal   Collection Time: 07/29/18 11:55 AM  Result Value Ref Range   Glucose-Capillary 223 (H) 70 - 99 mg/dL  Glucose, capillary     Status: Abnormal   Collection Time: 07/29/18  4:09 PM  Result Value Ref Range   Glucose-Capillary 234 (H) 70 - 99 mg/dL    Dg Chest 1 View  Result Date: 07/29/2018 CLINICAL DATA:  62 year old male with community-acquired pneumonia EXAM: CHEST  1 VIEW COMPARISON:  Prior chest x-ray 07/28/2018 FINDINGS: Cardiac and mediastinal contours are within normal limits. Atherosclerotic calcifications present in the transverse aorta. A metallic stent projects over the left heart. Patchy bibasilar  airspace opacities are new over the past 24 hours. Suspect trace left pleural effusion. Remote healed right-sided rib fractures. No acute osseous abnormality. No evidence of pneumothorax. IMPRESSION: 1. New bibasilar patchy airspace opacities. Differential considerations include bilateral pneumonia versus bibasilar atelectasis. 2. Suspect small left pleural effusion. 3.  Aortic Atherosclerosis (ICD10-170.0). Electronically Signed   By: Jacqulynn Cadet M.D.   On: 07/29/2018 12:30   Dg Chest 2 View  Result Date: 07/28/2018 CLINICAL DATA:  Hypoxia EXAM: CHEST - 2 VIEW COMPARISON:  07/22/2018 FINDINGS: Cardiac shadow is stable. The lungs are well aerated bilaterally. Previously seen infiltrative changes have nearly completely resolved. No new infiltrates are seen. Old rib fractures are noted on the right. No new bony abnormality is seen. IMPRESSION: Near complete resolution of previously seen infiltrates. Electronically Signed   By: Inez Catalina M.D.   On: 07/28/2018 08:00   Mr Jeri Cos RK Contrast  Result Date: 07/28/2018 CLINICAL DATA:  Initial evaluation for progressive altered mental status with history of dementia, evaluate for possible meningitis. EXAM: MRI HEAD WITHOUT AND WITH CONTRAST TECHNIQUE: Multiplanar, multiecho  pulse sequences of the brain and surrounding structures were obtained without and with intravenous contrast. CONTRAST:  7 cc of Gadavist. COMPARISON:  Prior CT from 07/26/2018. FINDINGS: Brain: Examination moderately to severely degraded by motion artifact. Generalized age-related cerebral atrophy. Probable tiny remote right cerebellar infarct noted. No abnormal foci of restricted diffusion to suggest acute or subacute ischemia. Gray-white matter differentiation maintained. No encephalomalacia to suggest chronic cortical infarction. No definite abnormal foci of susceptibility artifact to suggest acute or chronic intracranial hemorrhage. No mass lesion, midline shift or mass effect. No  hydrocephalus. No extra-axial fluid collection. No definite abnormal enhancement or changes to suggest meningitis. Pituitary gland grossly within normal limits. Vascular: Major intracranial vascular flow voids maintained at the skull base. Skull and upper cervical spine: Craniocervical junction within normal limits. No focal marrow replacing lesion. Scalp soft tissues unremarkable. Sinuses/Orbits: Globes and orbital soft tissues within normal limits. Paranasal sinuses are largely clear. Small bilateral mastoid effusions noted. Inner ear structures grossly normal. Other: None. IMPRESSION: 1. Technically limited examination due to extensive motion artifact. 2. No definite acute intracranial abnormality. No overt evidence for meningitis. 3. Mild age-related parenchymal volume loss. Electronically Signed   By: Jeannine Boga M.D.   On: 07/28/2018 06:41    ROS  UnAble to obtain review of systems Blood pressure (!) 170/95, pulse (!) 105, temperature 99.4 F (37.4 C), temperature source Axillary, resp. rate (!) 26, height 5\' 6"  (1.676 m), weight 69.1 kg, SpO2 95 %. Physical Exam  Total signs: Please see the above listed vital signs HEENT: Patient is presently on BiPAP, unresponsive, severely labored breathing with respiratory paradox. Cardiovascular: Tachycardia Pulmonary: Coarse rhonchi diffuse and bilateral Abdominal: Positive bowel sounds hypoactive Extremities: No clubbing, cyanosis or edema noted Neurologic: Limited neurologic exam at this time as patient is unresponsive  Assessment/Plan: Respiratory failure.  Most likely aspiration in the setting encephalopathy. Patient has been placed on BiPAP to help ease his respiratory effort.  At the present time saturations are holding concerning however that he will develop respiratory failure requiring intubation if family wants everything done. He is on vancomycin Cyclovir and meropenem.  Severe encephalopathy multifactorial etiology most likely  dementia with metabolic encephalopathy superimposed  Hyperglycemia  Hypophosphatemia  History of seizure disorder  Family arrived at bedside.  Wife states that patient had been doing quite well prior to this illness.  He has been very functional.  After discussion we will proceed with intubation, central line placement and pressors as needed patient is a full code  Critical care time 60 minutes  Nori Winegar 07/29/2018, 4:13 PM

## 2018-07-29 NOTE — Progress Notes (Signed)
Patient ID: Maurice Patterson, male   DOB: [birth year], 62 y.o.   MRN: [id number] Pulmonary/critical care  Procedure note Intubation Indication: Hypotensive, desaturating on BiPAP with altered mental status Discussed with wife who is agreeable Medications given: Etomidate 20 mg Using a glide scope under direct visualization a #8 endotracheal tube was passed without difficulty End-tidal CO2 noted, stable oxygen saturations, bilateral breath sounds, pending chest x-ray for confirmation  Hermelinda Dellen, DO

## 2018-07-29 NOTE — Progress Notes (Signed)
Pharmacy Antibiotic Note  Maurice Patterson is a 62 y.o. male admitted on 07/22/2018 with meningitis.  Pharmacy has been consulted for vanc/meropenem dosing. Patient was on vanc previously from 12/3 - 12/5 and went all of 12/6 without any vanc. Patient now being restarted on vanc.   Plan: Will start vanc 1.5g IV q12h  Will draw trough 12/8 @ 1700 prior to 4th dose. Will start meropenem 1g IV q8h  Ke 0.0823 T1/2 ~ 12 hrs Goal trough 15 - 20 mcg/mL  Height: 5\' 8"  (172.7 cm) Weight: 166 lb (75.3 kg) IBW/kg (Calculated) : 68.4  Temp (24hrs), Avg:101 F (38.3 C), Min:99 F (37.2 C), Max:102.1 F (38.9 C)  Recent Labs  Lab 07/22/18 2239 07/23/18 0206 07/24/18 0805 07/25/18 0310 07/27/18 0505 07/28/18 0519 07/29/18 0324  WBC 10.5 10.2 10.7*  --  8.9 10.5  --   CREATININE 0.76 0.69 0.67 0.61 0.53* 0.57* 0.79  VANCOTROUGH  --   --   --   --  <4*  --   --     Estimated Creatinine Clearance: 93.8 mL/min (by C-G formula based on SCr of 0.79 mg/dL).    Allergies  Allergen Reactions  . Hydrocodone Anaphylaxis  . Morphine Other (See Comments)    Loss of memory  . Ambien [Zolpidem] Other (See Comments)    delirium    . Brilinta [Ticagrelor] Other (See Comments)    Stroke   . Flexeril [Cyclobenzaprine] Other (See Comments)    delerium   . Flunitrazepam Other (See Comments)    ROHYPNOL (hallucinations)  . Haldol [Haloperidol Lactate] Other (See Comments)    delerium   . Levetiracetam Diarrhea and Other (See Comments)    Unable to walk  . Lorazepam Hives  . Risperdal [Risperidone] Other (See Comments)    Delirium   . Trazodone Other (See Comments)    Delirium Can take in low doses   . Benadryl [Diphenhydramine Hcl (Sleep)] Rash  . Penicillins Rash    Mouth ulcers Has patient had a PCN reaction causing immediate rash, facial/tongue/throat swelling, SOB or lightheadedness with hypotension: Yes Has patient had a PCN reaction causing severe rash involving mucus membranes  or skin necrosis: No Has patient had a PCN reaction that required hospitalization No Has patient had a PCN reaction occurring within the last 10 years: Yes If all of the above answers are "NO", then may proceed with Cephalosporin use.    Thank you for allowing pharmacy to be a part of this patient's care.  Tobie Lords, PharmD, BCPS Clinical Pharmacist 07/29/2018

## 2018-07-29 NOTE — Progress Notes (Signed)
Family was in the process of changing patient when NT went in to change him. They said they were fine and sent the NT out.

## 2018-07-29 NOTE — Progress Notes (Signed)
Pt febrile 102.1. Dr Jodell Cipro notified. Received new order Vamcomycin

## 2018-07-30 ENCOUNTER — Inpatient Hospital Stay: Payer: PPO

## 2018-07-30 DIAGNOSIS — J9601 Acute respiratory failure with hypoxia: Secondary | ICD-10-CM

## 2018-07-30 LAB — GLUCOSE, CAPILLARY
GLUCOSE-CAPILLARY: 319 mg/dL — AB (ref 70–99)
Glucose-Capillary: 144 mg/dL — ABNORMAL HIGH (ref 70–99)
Glucose-Capillary: 150 mg/dL — ABNORMAL HIGH (ref 70–99)
Glucose-Capillary: 155 mg/dL — ABNORMAL HIGH (ref 70–99)
Glucose-Capillary: 160 mg/dL — ABNORMAL HIGH (ref 70–99)
Glucose-Capillary: 161 mg/dL — ABNORMAL HIGH (ref 70–99)
Glucose-Capillary: 168 mg/dL — ABNORMAL HIGH (ref 70–99)
Glucose-Capillary: 192 mg/dL — ABNORMAL HIGH (ref 70–99)
Glucose-Capillary: 200 mg/dL — ABNORMAL HIGH (ref 70–99)
Glucose-Capillary: 200 mg/dL — ABNORMAL HIGH (ref 70–99)
Glucose-Capillary: 204 mg/dL — ABNORMAL HIGH (ref 70–99)
Glucose-Capillary: 211 mg/dL — ABNORMAL HIGH (ref 70–99)
Glucose-Capillary: 328 mg/dL — ABNORMAL HIGH (ref 70–99)
Glucose-Capillary: 337 mg/dL — ABNORMAL HIGH (ref 70–99)
Glucose-Capillary: 369 mg/dL — ABNORMAL HIGH (ref 70–99)
Glucose-Capillary: 373 mg/dL — ABNORMAL HIGH (ref 70–99)
Glucose-Capillary: 386 mg/dL — ABNORMAL HIGH (ref 70–99)

## 2018-07-30 LAB — COMPREHENSIVE METABOLIC PANEL
ALT: 15 U/L (ref 0–44)
AST: 17 U/L (ref 15–41)
Albumin: 2.3 g/dL — ABNORMAL LOW (ref 3.5–5.0)
Alkaline Phosphatase: 117 U/L (ref 38–126)
Anion gap: 6 (ref 5–15)
BUN: 26 mg/dL — ABNORMAL HIGH (ref 8–23)
CO2: 25 mmol/L (ref 22–32)
Calcium: 8.1 mg/dL — ABNORMAL LOW (ref 8.9–10.3)
Chloride: 111 mmol/L (ref 98–111)
Creatinine, Ser: 1.6 mg/dL — ABNORMAL HIGH (ref 0.61–1.24)
GFR calc non Af Amer: 46 mL/min — ABNORMAL LOW (ref >60)
GFR, EST AFRICAN AMERICAN: 53 mL/min — AB (ref >60)
Glucose, Bld: 373 mg/dL — ABNORMAL HIGH (ref 70–99)
Potassium: 4.8 mmol/L (ref 3.5–5.1)
Sodium: 142 mmol/L (ref 135–145)
Total Bilirubin: 0.4 mg/dL (ref 0.3–1.2)
Total Protein: 5.8 g/dL — ABNORMAL LOW (ref 6.5–8.1)

## 2018-07-30 LAB — CBC
HCT: 35.7 % — ABNORMAL LOW (ref 39.0–52.0)
Hemoglobin: 11.5 g/dL — ABNORMAL LOW (ref 13.0–17.0)
MCH: 29 pg (ref 26.0–34.0)
MCHC: 32.2 g/dL (ref 30.0–36.0)
MCV: 90.2 fL (ref 80.0–100.0)
Platelets: 285 10*3/uL (ref 150–400)
RBC: 3.96 MIL/uL — ABNORMAL LOW (ref 4.22–5.81)
RDW: 14.1 % (ref 11.5–15.5)
WBC: 19.4 10*3/uL — ABNORMAL HIGH (ref 4.0–10.5)
nRBC: 0 % (ref 0.0–0.2)

## 2018-07-30 LAB — CSF CULTURE W GRAM STAIN
Culture: NO GROWTH
Gram Stain: NONE SEEN

## 2018-07-30 LAB — TRIGLYCERIDES: Triglycerides: 69 mg/dL (ref <150)

## 2018-07-30 LAB — MAGNESIUM: Magnesium: 2.1 mg/dL (ref 1.7–2.4)

## 2018-07-30 LAB — PHOSPHORUS: Phosphorus: 3.4 mg/dL (ref 2.5–4.6)

## 2018-07-30 LAB — PROCALCITONIN: Procalcitonin: 1.28 ng/mL

## 2018-07-30 MED ORDER — ASPIRIN 81 MG PO CHEW
81.0000 mg | CHEWABLE_TABLET | Freq: Every day | ORAL | Status: DC
  Administered 2018-07-30 – 2018-08-02 (×4): 81 mg via ORAL
  Filled 2018-07-30 (×4): qty 1

## 2018-07-30 MED ORDER — PRO-STAT SUGAR FREE PO LIQD: 30.0000 mL | Freq: Two times a day (BID) | ORAL | Status: DC

## 2018-07-30 MED ORDER — SODIUM CHLORIDE 0.9 % IV SOLN
1.0000 g | Freq: Two times a day (BID) | INTRAVENOUS | Status: DC
  Filled 2018-07-30: qty 1

## 2018-07-30 MED ORDER — PANTOPRAZOLE SODIUM 40 MG IV SOLR
40.0000 mg | Freq: Every day | INTRAVENOUS | Status: DC
  Administered 2018-07-30 – 2018-08-03 (×5): 40 mg via INTRAVENOUS
  Filled 2018-07-30 (×5): qty 40

## 2018-07-30 MED ORDER — INSULIN ASPART 100 UNIT/ML ~~LOC~~ SOLN: 0.0000 [IU] | SUBCUTANEOUS | Status: DC

## 2018-07-30 MED ORDER — VITAL AF 1.2 CAL PO LIQD
1000.0000 mL | ORAL | Status: DC
  Administered 2018-07-30 – 2018-08-02 (×3): 1000 mL

## 2018-07-30 MED ORDER — VITAL HIGH PROTEIN PO LIQD
1000.0000 mL | ORAL | Status: DC
  Administered 2018-07-30: 1000 mL

## 2018-07-30 MED ORDER — PIPERACILLIN-TAZOBACTAM 3.375 G IVPB
3.3750 g | Freq: Three times a day (TID) | INTRAVENOUS | Status: DC
  Administered 2018-07-30 – 2018-08-01 (×6): 3.375 g via INTRAVENOUS
  Filled 2018-07-30 (×6): qty 50

## 2018-07-30 MED ORDER — DEXTROSE 5 % IV SOLN
700.0000 mg | Freq: Two times a day (BID) | INTRAVENOUS | Status: DC
  Filled 2018-07-30 (×3): qty 14

## 2018-07-30 MED ORDER — METOPROLOL TARTRATE 50 MG PO TABS
50.0000 mg | ORAL_TABLET | Freq: Every day | ORAL | Status: DC
  Administered 2018-07-30 – 2018-07-31 (×2): 50 mg via ORAL
  Filled 2018-07-30 (×2): qty 1

## 2018-07-30 MED ORDER — VANCOMYCIN HCL 10 G IV SOLR
1250.0000 mg | INTRAVENOUS | Status: DC
  Filled 2018-07-30 (×3): qty 1250

## 2018-07-30 MED ORDER — INSULIN REGULAR(HUMAN) IN NACL 100-0.9 UT/100ML-% IV SOLN
INTRAVENOUS | Status: DC
  Administered 2018-07-30: 3.3 [IU]/h via INTRAVENOUS
  Administered 2018-07-30: 10.1 [IU]/h via INTRAVENOUS
  Administered 2018-07-31: 3.7 [IU]/h via INTRAVENOUS
  Filled 2018-07-30 (×3): qty 100

## 2018-07-30 NOTE — Progress Notes (Signed)
Follow up - Critical Care Medicine Note  Patient Details:    Maurice Patterson is an 62 y.o. male  with an underlying past medical history of congestive heart failure, coronary artery disease, chronic renal failure, diabetes, hypertension, hyperlipidemia, gastroesophageal reflux disease, prior history of pancreatic cancer was brought in by EMS with complaints of weakness and progressive altered mental status.  She was admitted to the hospital with a diagnosis of community-acquired pneumonia and encephalopathy.  Felt to be metabolic in nature, neurology was consulted, negative work-up to include lumbar puncture and brain MRI.  Patient developed progressive respiratory failure with presumptive aspiration.  He was brought to the intensive care unit and started on BiPAP.  Family is on their way in to discuss goals of care  Lines, Airways, Drains: Airway 8 mm (Active)  Secured at (cm) 25 cm 07/30/2018  2:43 AM  Measured From Lips 07/30/2018  2:43 AM  Secured Location Right 07/30/2018  2:43 AM  Secured By [employer] 07/30/2018  2:43 AM  Tube Holder Repositioned Yes 07/30/2018  2:43 AM  Cuff Pressure (cm H2O) 26 cm H2O 07/29/2018  9:50 PM  Site Condition Dry 07/30/2018  2:43 AM     NG/OG Tube Orogastric 16 Fr. Left mouth Xray Documented cm marking at nare/ corner of mouth 66 cm (Active)  Cm Marking at Nare/Corner of Mouth (if applicable) 66 cm 96/09/9526  8:00 PM  Site Assessment Clean;Dry;Intact 07/29/2018  8:00 PM  Ongoing Placement Verification Other (Comment) 07/29/2018  8:00 PM  Status Clamped 07/29/2018  8:00 PM     Urethral Catheter Maurice Patterson Double-lumen;Latex 16 Fr. (Active)  Indication for Insertion or Continuance of Catheter Unstable critical patients (first 24-48 hours) 07/29/2018  8:00 PM  Site Assessment Clean;Intact 07/29/2018  8:00 PM  Catheter Maintenance Bag below level of bladder;Drainage bag/tubing not touching floor 07/29/2018  8:00 PM  Collection Container Standard drainage  bag 07/29/2018  8:00 PM  Securement Method Securing device (Describe) 07/29/2018  8:00 PM  Urinary Catheter Interventions Unclamped 07/29/2018  8:00 PM  Output (mL) 155 mL 07/29/2018 10:25 PM     Ureteral Drain/Stent Left ureter 6 Fr. (Active)    Anti-infectives:  Anti-infectives (From admission, onward)   Start     Dose/Rate Route Frequency Ordered Stop   07/29/18 0600  vancomycin (VANCOCIN) 1,500 mg in sodium chloride 0.9 % 500 mL IVPB     1,500 mg 250 mL/hr over 120 Minutes Intravenous Every 12 hours 07/29/18 0531     07/29/18 0600  meropenem (MERREM) 1 g in sodium chloride 0.9 % 100 mL IVPB     1 g 200 mL/hr over 30 Minutes Intravenous Every 8 hours 07/29/18 0531     07/28/18 1135  cefTRIAXone (ROCEPHIN) 1 g in sodium chloride 0.9 % 100 mL IVPB  Status:  Discontinued     1 g 200 mL/hr over 30 Minutes Intravenous Every 24 hours 07/27/18 1836 07/29/18 0529   07/27/18 1800  vancomycin (VANCOCIN) IVPB 1000 mg/200 mL premix  Status:  Discontinued     1,000 mg 200 mL/hr over 60 Minutes Intravenous Every 12 hours 07/27/18 0805 07/27/18 1836   07/27/18 1351  vancomycin (VANCOCIN) IVPB 1000 mg/200 mL premix  Status:  Discontinued     1,000 mg 200 mL/hr over 60 Minutes Intravenous Every 8 hours 07/27/18 0724 07/27/18 0805   07/25/18 1800  vancomycin (VANCOCIN) IVPB 1000 mg/200 mL premix  Status:  Discontinued     1,000 mg 200 mL/hr over 60 Minutes  Intravenous Every 12 hours 07/25/18 1132 07/27/18 0724   07/25/18 1600  ampicillin (OMNIPEN) 2 g in sodium chloride 0.9 % 100 mL IVPB  Status:  Discontinued     2 g 300 mL/hr over 20 Minutes Intravenous Every 4 hours 07/25/18 1501 07/27/18 1836   07/25/18 1230  acyclovir (ZOVIRAX) 700 mg in dextrose 5 % 100 mL IVPB     700 mg 114 mL/hr over 60 Minutes Intravenous Every 8 hours 07/25/18 1117     07/25/18 1200  cefTRIAXone (ROCEPHIN) 2 g in sodium chloride 0.9 % 100 mL IVPB  Status:  Discontinued     2 g 200 mL/hr over 30 Minutes Intravenous Every  12 hours 07/25/18 1042 07/27/18 1836   07/25/18 1100  vancomycin (VANCOCIN) IVPB 1000 mg/200 mL premix     1,000 mg 200 mL/hr over 60 Minutes Intravenous STAT 07/25/18 1049 07/25/18 1246   07/23/18 0400  azithromycin (ZITHROMAX) 500 mg in sodium chloride 0.9 % 250 mL IVPB     500 mg 250 mL/hr over 60 Minutes Intravenous Every 24 hours 07/23/18 0122 07/25/18 0449   07/23/18 0300  cefTRIAXone (ROCEPHIN) 1 g in sodium chloride 0.9 % 100 mL IVPB  Status:  Discontinued     1 g 200 mL/hr over 30 Minutes Intravenous Every 24 hours 07/23/18 0122 07/25/18 1042   07/23/18 0000  cefTRIAXone (ROCEPHIN) 1 g in sodium chloride 0.9 % 100 mL IVPB  Status:  Discontinued     1 g 200 mL/hr over 30 Minutes Intravenous  Once 07/22/18 2348 07/23/18 0122   07/23/18 0000  azithromycin (ZITHROMAX) 500 mg in sodium chloride 0.9 % 250 mL IVPB  Status:  Discontinued     500 mg 250 mL/hr over 60 Minutes Intravenous  Once 07/22/18 2348 07/23/18 0122      Microbiology: Results for orders placed or performed during the hospital encounter of 07/22/18  Urine culture     Status: None   Collection Time: 07/22/18 11:03 PM  Result Value Ref Range Status   Specimen Description   Final    URINE, RANDOM Performed at [facility], [address] [place], [place] [address]    Special Requests   Final    NONE Performed at [facility], [address] [place], [place] [address]    Culture   Final    NO GROWTH Performed at [facility], [place] [address], [place], [place] [address]    Report Status 07/24/2018 FINAL  Final  CULTURE, BLOOD (ROUTINE X 2) w Reflex to ID Panel     Status: None   Collection Time: 07/23/18 10:06 AM  Result Value Ref Range Status   Specimen Description BLOOD BLOOD LEFT ARM  Final   Special Requests   Final    BOTTLES DRAWN AEROBIC AND ANAEROBIC Blood Culture adequate volume   Culture   Final    NO GROWTH 5 DAYS Performed at [facility], [address] [place], [place] [address]    Report Status 07/28/2018 FINAL  Final  CULTURE, BLOOD (ROUTINE X 2) w Reflex to ID Panel     Status: None   Collection Time: 07/23/18 10:17 AM  Result Value Ref Range Status   Specimen Description BLOOD BLOOD LEFT WRIST  Final   Special Requests   Final    BOTTLES DRAWN AEROBIC AND ANAEROBIC Blood Culture adequate volume   Culture   Final    NO GROWTH 5 DAYS Performed at [facility],  [place], [place] [address]    Report Status 07/28/2018 FINAL  Final  Gram stain     Status: None   Collection Time: 07/26/18  3:11 PM  Result Value Ref Range Status   Specimen Description CSF  Final   Special Requests NONE  Final   Gram Stain   Final    NO ORGANISMS SEEN WBC SEEN Performed at [facility], [address] [place], [place] [address]    Report Status 07/26/2018 FINAL  Final  CSF culture     Status: None (Preliminary result)   Collection Time: 07/26/18  3:11 PM  Result Value Ref Range Status   Specimen Description   Final    CSF Performed at [facility], [address], [place], [place] [address]    Special Requests   Final    NONE Performed at [facility], [address] [place], [place] [address]    Gram Stain   Final    NO ORGANISMS SEEN WBC SEEN NO RBC SEEN Performed at [facility], [address], [place], [place] [address]    Culture   Final    NO GROWTH 3 DAYS Performed at [facility], [place] [address], [place], [place] [address]    Report Status PENDING  Incomplete  Fungus Culture With Stain     Status: None (Preliminary result)   Collection Time: 07/26/18  3:11 PM  Result Value Ref Range Status   Fungus Stain Final report  Final    Comment: (NOTE) Performed At: [facility] [place], [place] [id number] Rush Farmer MD [id number]    Fungus (Mycology) Culture PENDING  Incomplete   Fungal Source CSF  Final    Comment: Performed at  [facility], [address] [place], [place] [address]  Fungus Culture Result     Status: None   Collection Time: 07/26/18  3:11 PM  Result Value Ref Range Status   Result 1 Comment  Final    Comment: (NOTE) KOH/Calcofluor preparation:  no fungus observed. Performed At: [facility] [place], [place] [id number] Rush Farmer MD GG:[id number]   MRSA PCR Screening     Status: None   Collection Time: 07/29/18  4:18 PM  Result Value Ref Range Status   MRSA by PCR NEGATIVE NEGATIVE Final    Comment:        The GeneXpert MRSA Assay (FDA approved for NASAL specimens only), is one component of a comprehensive MRSA colonization surveillance program. It is not intended to diagnose MRSA infection nor to guide or monitor treatment for MRSA infections. Performed at [facility], [address] [place], [place] [address]     Studies: Dg Chest 1 View  Result Date: 07/29/2018 CLINICAL DATA:  62 year old male with community-acquired pneumonia EXAM: CHEST  1 VIEW COMPARISON:  Prior chest x-ray 07/28/2018 FINDINGS: Cardiac and mediastinal contours are within normal limits. Atherosclerotic calcifications present in the transverse aorta. A metallic stent projects over the left heart. Patchy bibasilar airspace opacities are new over the past 24 hours. Suspect trace left pleural effusion. Remote healed right-sided rib fractures. No acute osseous abnormality. No evidence of pneumothorax. IMPRESSION: 1. New bibasilar patchy airspace opacities. Differential considerations include bilateral pneumonia versus bibasilar atelectasis. 2. Suspect small left pleural effusion. 3.  Aortic Atherosclerosis (ICD10-170.0). Electronically Signed   By: Jacqulynn Cadet M.D.   On: 07/29/2018 12:30   Dg Chest 2 View  Result Date: 07/28/2018  CLINICAL DATA:  Hypoxia EXAM: CHEST - 2 VIEW COMPARISON:  07/22/2018 FINDINGS: Cardiac shadow is stable. The lungs are well aerated  bilaterally. Previously seen infiltrative changes have nearly completely resolved. No new infiltrates are seen. Old rib fractures are noted on the right. No new bony abnormality is seen. IMPRESSION: Near complete resolution of previously seen infiltrates. Electronically Signed   By: Inez Catalina M.D.   On: 07/28/2018 08:00   Dg Abd 1 View  Result Date: 07/29/2018 CLINICAL DATA:  NG tube placement EXAM: ABDOMEN - 1 VIEW COMPARISON:  None. FINDINGS: Enteric tube terminates in the distal gastric body. Nonobstructive bowel gas pattern. IMPRESSION: Enteric tube terminates in the distal gastric body. Electronically Signed   By: Julian Hy M.D.   On: 07/29/2018 20:10   Ct Head Wo Contrast  Result Date: 07/26/2018 CLINICAL DATA:  Confusion. Pneumonia. History of stroke and dementia. EXAM: CT HEAD WITHOUT CONTRAST TECHNIQUE: Contiguous axial images were obtained from the base of the skull through the vertex without intravenous contrast. COMPARISON:  CT HEAD July 24, 2018 and MRI head July 23, 2018 FINDINGS: BRAIN: No intraparenchymal hemorrhage, mass effect nor midline shift. Stable moderate parenchymal brain volume loss. No hydrocephalus. Patchy supratentorial white matter hypodensities. No acute large vascular territory infarcts. No abnormal extra-axial fluid collections. Basal cisterns are patent. VASCULAR: Moderate calcific atherosclerosis of the carotid siphons and included vertebral arteries. SKULL: No skull fracture. Calcified stylohyoid ligaments. No significant scalp soft tissue swelling. SINUSES/ORBITS: Trace paranasal sinus mucosal thickening. Mastoid air cells are well aerated.The included ocular globes and orbital contents are non-suspicious. OTHER: Patient is edentulous. IMPRESSION: 1. No acute intracranial process. 2. Stable moderate parenchymal brain volume loss, advanced for age. 3. Stable mild chronic small vessel ischemic changes. Electronically Signed   By: Elon Alas M.D.    On: 07/26/2018 04:56   Ct Head Wo Contrast  Result Date: 07/24/2018 CLINICAL DATA:  Initial evaluation for acute trauma, fall, found down. EXAM: CT HEAD WITHOUT CONTRAST TECHNIQUE: Contiguous axial images were obtained from the base of the skull through the vertex without intravenous contrast. COMPARISON:  Prior CT from 07/22/2018. FINDINGS: Brain: Generalized cerebral atrophy with moderate chronic small vessel ischemic disease, stable. No acute intracranial hemorrhage. No acute large vessel territory infarct. No mass lesion, midline shift or mass effect. No hydrocephalus. No extra-axial fluid collection. Vascular: No hyperdense vessel. Calcified atherosclerosis at the skull base. Skull: Scalp soft tissues demonstrate no acute finding. Calvarium intact. Sinuses/Orbits: Globes orbital soft tissues within normal limits. Paranasal sinuses are clear. Trace right mastoid effusion noted, likely chronic. Mastoid air cells otherwise clear. Other: None. IMPRESSION: 1. Stable appearance of the head with no acute intracranial abnormality identified. 2. Atrophy with chronic microvascular ischemic disease. Electronically Signed   By: Jeannine Boga M.D.   On: 07/24/2018 03:27   Ct Head Wo Contrast  Result Date: 07/22/2018 CLINICAL DATA:  Pt arrived from home via EMS with complaints of weakness. EMS states that he was completely fine two days ago and has gradually been getting worse. Pt has Hx of dementia and diabetes. EMS stated a Negative Stroke Screen. Hx of stroke and heart stents. EXAM: CT HEAD WITHOUT CONTRAST TECHNIQUE: Contiguous axial images were obtained from the base of the skull through the vertex without intravenous contrast. COMPARISON:  04/14/2017 FINDINGS: Brain: No evidence of acute infarction, hemorrhage, hydrocephalus, extra-axial collection or mass lesion/mass effect. Mild ventricular and sulcal enlargement is noted consistent with mild generalized atrophy. Periventricular white matter  hypoattenuation is also  present consistent minor chronic microvascular ischemic change. Vascular: No hyperdense vessel or unexpected calcification. Skull: Normal. Negative for fracture or focal lesion. Sinuses/Orbits: Globes and orbits are unremarkable. Visualized sinuses and mastoid air cells are clear. Other: None. IMPRESSION: 1. No acute intracranial abnormalities. 2. Mild atrophy and minor chronic microvascular ischemic change. Stable appearance from the prior study Electronically Signed   By: Lajean Manes M.D.   On: 07/22/2018 23:34   Mr Brain Wo Contrast  Result Date: 07/23/2018 CLINICAL DATA:  Altered level of consciousness. Progressive lethargy. EXAM: MRI HEAD WITHOUT CONTRAST TECHNIQUE: Multiplanar, multiecho pulse sequences of the brain and surrounding structures were obtained without intravenous contrast. COMPARISON:  Head CT 07/22/2018 and MRI 02/20/2015 FINDINGS: The patient was unable to tolerate the complete examination. Axial and coronal diffusion, sagittal T1, and axial FLAIR sequences were obtained and are moderately motion degraded. Brain: There is no evidence of acute infarct. Cerebral atrophy is mildly advanced for age. Scattered cerebral white matter T2 hyperintensities are grossly similar to the prior MRI and nonspecific but compatible with mild chronic small vessel ischemic disease. Vascular: Not evaluated on this limited study. Skull and upper cervical spine: No destructive calvarial lesion. Sinuses/Orbits: Grossly clear sinuses. No gross orbital abnormality. Other: None. IMPRESSION: 1. Motion degraded, incomplete examination. 2. No acute infarct. 3. Mild chronic small vessel ischemic disease and cerebral atrophy. Electronically Signed   By: Logan Bores M.D.   On: 07/23/2018 14:57   Mr Jeri Cos HB Contrast  Result Date: 07/28/2018 CLINICAL DATA:  Initial evaluation for progressive altered mental status with history of dementia, evaluate for possible meningitis. EXAM: MRI HEAD  WITHOUT AND WITH CONTRAST TECHNIQUE: Multiplanar, multiecho pulse sequences of the brain and surrounding structures were obtained without and with intravenous contrast. CONTRAST:  7 cc of Gadavist. COMPARISON:  Prior CT from 07/26/2018. FINDINGS: Brain: Examination moderately to severely degraded by motion artifact. Generalized age-related cerebral atrophy. Probable tiny remote right cerebellar infarct noted. No abnormal foci of restricted diffusion to suggest acute or subacute ischemia. Gray-white matter differentiation maintained. No encephalomalacia to suggest chronic cortical infarction. No definite abnormal foci of susceptibility artifact to suggest acute or chronic intracranial hemorrhage. No mass lesion, midline shift or mass effect. No hydrocephalus. No extra-axial fluid collection. No definite abnormal enhancement or changes to suggest meningitis. Pituitary gland grossly within normal limits. Vascular: Major intracranial vascular flow voids maintained at the skull base. Skull and upper cervical spine: Craniocervical junction within normal limits. No focal marrow replacing lesion. Scalp soft tissues unremarkable. Sinuses/Orbits: Globes and orbital soft tissues within normal limits. Paranasal sinuses are largely clear. Small bilateral mastoid effusions noted. Inner ear structures grossly normal. Other: None. IMPRESSION: 1. Technically limited examination due to extensive motion artifact. 2. No definite acute intracranial abnormality. No overt evidence for meningitis. 3. Mild age-related parenchymal volume loss. Electronically Signed   By: Jeannine Boga M.D.   On: 07/28/2018 06:41   Dg Chest Port 1 View  Result Date: 07/29/2018 CLINICAL DATA:  Status post intubation. EXAM: PORTABLE CHEST 1 VIEW COMPARISON:  Earlier film, same date. FINDINGS: The endotracheal tube is 2.6 cm above the carina. The NG tube is coursing down the esophagus and into the stomach. The heart is normal in size and stable.  Stable coronary artery stents. The lungs show improved aeration post intubation with improved bibasilar aeration and resolving bibasilar atelectasis. No pulmonary edema. IMPRESSION: The endotracheal tube and NG tubes are in good position. Improved basilar lung aeration post intubation. Electronically Signed  By: Marijo Sanes M.D.   On: 07/29/2018 20:07   Dg Chest Port 1 View  Result Date: 07/22/2018 CLINICAL DATA:  Weakness. EXAM: PORTABLE CHEST 1 VIEW COMPARISON:  December 12, 2017 FINDINGS: No pneumothorax. The heart, hila, and mediastinum are normal. Mild opacity in the left base and right upper lung. No other acute abnormalities. IMPRESSION: Suspected subtle infiltrate in the right upper lung and left base. Recommend follow-up to resolution. Electronically Signed   By: Dorise Bullion III M.D   On: 07/22/2018 23:42   [place] Ekg Site Rite  Result Date: 07/27/2018 If Site Rite image not attached, placement could not be confirmed due to current cardiac rhythm.  Dg Fluoro Guided Loc Of Needle/cath Tip For Spinal Inject Lt  Result Date: 07/26/2018 CLINICAL DATA:  Altered mental status and fevers EXAM: DIAGNOSTIC LUMBAR PUNCTURE UNDER FLUOROSCOPIC GUIDANCE FLUOROSCOPY TIME:  Fluoroscopy Time:  48 seconds Radiation Exposure Index (if provided by the fluoroscopic device): 24.4 mGy Number of Acquired Spot Images: 1 PROCEDURE: Informed consent was obtained from the patient's wife prior to the procedure, including potential complications of headache, allergy, and pain. With the patient prone, the lower back was prepped with Betadine. 1% Lidocaine was used for local anesthesia. Lumbar puncture was performed at the L3 level using a 20 gauge needle with return of clear CSF with an opening pressure of 14.5 cm water. Fourteen ml of CSF were obtained for laboratory studies. The patient tolerated the procedure well and there were no apparent complications. IMPRESSION: Successful fluoroscopic guided lumbar puncture  Electronically Signed   By: Inez Catalina M.D.   On: 07/26/2018 15:30    Consults: Treatment Team:  Catarina Hartshorn, MD Leotis Pain, MD Leonel Ramsay, MD   Subjective:    Overnight Issues: Patient resting comfortably, sedated remains on mechanical ventilation.  Patient on low-dose norepinephrine  Objective:  Vital signs for last 24 hours: Temp:  [96.7 F (35.9 C)-100.1 F (37.8 C)] 96.7 F (35.9 C) (12/08 0050) Pulse Rate:  [69-115] 69 (12/07 2200) Resp:  [15-27] 16 (12/07 2200) BP: (105-187)/(68-95) 141/76 (12/07 2200) SpO2:  [93 %-100 %] 98 % (12/07 2200) FiO2 (%):  [28 %-40 %] 40 % (12/08 0243) Weight:  [69.1 kg] 69.1 kg (12/07 1610)  Hemodynamic parameters for last 24 hours:    Intake/Output from previous day: 12/07 0701 - 12/08 0700 In: 2895 [I.V.:1326.6; IV Piggyback:1568.5] Out: 315 [Urine:315]  Intake/Output this shift: Total I/O In: 1319.8 [I.V.:658; IV Piggyback:661.8] Out: 315 [Urine:315]  Vent settings for last 24 hours: Vent Mode: PRVC FiO2 (%):  [28 %-40 %] 40 % Set Rate:  [16 bmp] 16 bmp Vt Set:  [500 mL] 500 mL PEEP:  [5 cmH20] 5 cmH20 Plateau Pressure:  [18 cmH20] 18 cmH20  Physical Exam:  Vital signs:      Please see the above listed vital signs HEENT:            Orally intubated, orogastric tube, sedated on mechanical ventilation Cardiovascular:            Regular rate and rhythm Pulmonary:      Coarse rhonchi diffuse and bilateral Abdominal:      Positive bowel sounds hypoactive Extremities:     No clubbing, cyanosis or edema noted Neurologic:      Limited neurologic exam at this time as patient is unresponsive  Assessment/Plan:  Respiratory failure.  Most likely aspiration in the setting encephalopathy.  She is presently resting comfortably on mechanical ventilation.  Empiric antibiotics with  vancomycin, meropenem and acyclovir.  Encephalopathy.  Felt to be metabolic in nature with negative imaging studies and lumbar puncture  being followed by neurology  Hyperglycemia.  On scale coverage  Hypophosphatemia.  Replaced  History of seizure disorder.  Patient is on Lamictal  Leukocytosis.  Most likely secondary to pneumonia on empiric broad-spectrum coverage  Renal insufficiency.  BUN is 26/creatinine 1.6, potassium stable at 4.8 and CO2 is 25  Critical Care Total Time 30 minutes  Adna Nofziger 07/30/2018  *Care during the described time interval was provided by me and/or other providers on the critical care team.  I have reviewed this patient's available data, including medical history, events of note, physical examination and test results as part of my evaluation.

## 2018-07-30 NOTE — Progress Notes (Signed)
[facility] INFECTIOUS DISEASE PROGRESS NOTE Date of Admission:  07/22/2018     ID: Maurice Patterson is a 62 y.o. male with pneumonia and AMS  Principal Problem:   CAP (community acquired pneumonia) Active Problems:   Uncontrolled type 2 diabetes mellitus with hyperglycemia, with long-term current use of insulin (HCC)   Essential hypertension   Alzheimer's dementia ([place])   GERD (gastroesophageal reflux disease)   Fever   Subjective: Intubated yest due to increasing resp distress. In unit, weaned off pressors. Sedated  ROS  Unable to obtain  Medications:  Antibiotics Given (last 72 hours)    Date/Time Action Medication Dose Rate   07/27/18 1514 New Bag/Given   ampicillin (OMNIPEN) 2 g in sodium chloride 0.9 % 100 mL IVPB 2 g 300 mL/hr   07/27/18 1814 New Bag/Given   vancomycin (VANCOCIN) IVPB 1000 mg/200 mL premix 1,000 mg 200 mL/hr   07/27/18 2004 New Bag/Given   acyclovir (ZOVIRAX) 700 mg in dextrose 5 % 100 mL IVPB 700 mg 114 mL/hr   07/28/18 0539 New Bag/Given   acyclovir (ZOVIRAX) 700 mg in dextrose 5 % 100 mL IVPB 700 mg 114 mL/hr   07/28/18 1125 New Bag/Given   cefTRIAXone (ROCEPHIN) 1 g in sodium chloride 0.9 % 100 mL IVPB 1 g 200 mL/hr   07/28/18 1634 New Bag/Given   acyclovir (ZOVIRAX) 700 mg in dextrose 5 % 100 mL IVPB 700 mg 114 mL/hr   07/29/18 0133 New Bag/Given   acyclovir (ZOVIRAX) 700 mg in dextrose 5 % 100 mL IVPB 700 mg 114 mL/hr   07/29/18 0705 New Bag/Given   vancomycin (VANCOCIN) 1,500 mg in sodium chloride 0.9 % 500 mL IVPB 1,500 mg 250 mL/hr   07/29/18 1452 New Bag/Given   meropenem (MERREM) 1 g in sodium chloride 0.9 % 100 mL IVPB 1 g 200 mL/hr   07/29/18 1912 New Bag/Given   vancomycin (VANCOCIN) 1,500 mg in sodium chloride 0.9 % 500 mL IVPB 1,500 mg 250 mL/hr   07/29/18 2239 New Bag/Given   meropenem (MERREM) 1 g in sodium chloride 0.9 % 100 mL IVPB 1 g 200 mL/hr   07/30/18 0004 New Bag/Given   acyclovir (ZOVIRAX) 700 mg in dextrose 5 % 100  mL IVPB 700 mg 114 mL/hr   07/30/18 0514 New Bag/Given   meropenem (MERREM) 1 g in sodium chloride 0.9 % 100 mL IVPB 1 g 200 mL/hr   07/30/18 0732 New Bag/Given   acyclovir (ZOVIRAX) 700 mg in dextrose 5 % 100 mL IVPB 700 mg 114 mL/hr     . aspirin  81 mg Oral Daily  . chlorhexidine gluconate (MEDLINE KIT)  15 mL Mouth Rinse BID  . donepezil  10 mg Oral BID  . enoxaparin (LOVENOX) injection  40 mg Subcutaneous Q24H  . ipratropium-albuterol  3 mL Nebulization Q4H  . lactulose  20 g Oral Daily  . lamoTRIgine  100 mg Oral BID  . lidocaine  1 patch Transdermal Q24H  . mouth rinse  15 mL Mouth Rinse 10 times per day  . memantine  10 mg Oral BID  . methylPREDNISolone (SOLU-MEDROL) injection  60 mg Intravenous Q24H  . metoprolol tartrate  50 mg Oral Daily  . pantoprazole  40 mg Oral QHS    Objective: Vital signs in last 24 hours: Temp:  [96.7 F (35.9 C)-99.6 F (37.6 C)] 99.6 F (37.6 C) (12/08 0746) Pulse Rate:  [69-118] 118 (12/08 0845) Resp:  [14-27] 20 (12/08 0845) BP: (93-187)/(57-89) 112/58 (12/08  0845) SpO2:  [93 %-100 %] 95 % (12/08 0845) FiO2 (%):  [28 %-40 %] 30 % (12/08 0833) Weight:  [69.1 kg] 69.1 kg (12/07 1610) Physical Exam  Constitutional: intubated, sedated HENT: aniceric, mild aniscoria but reactive Mouth/Throat: ETT in place Cardiovascular: Normal rate, regular rhythm and normal heart sounds.  Pulmonary/Chest: bil rhonchi and poor air movement Abdominal: Soft. Bowel sounds are normal. He exhibits no distension. There is no tenderness.  Lymphadenopathy: He has no cervical adenopathy.  Neurological: sedated  Skin: Skin is warm and dry. No rash noted. No erythema.  Psychiatric: min responsive Ext trace edema bil  Lab Results Recent Labs    07/28/18 0519 07/29/18 0324 07/30/18 0501  WBC 10.5  --  19.4*  HGB 13.6  --  11.5*  HCT 40.7  --  35.7*  NA 135 136 142  K 3.8 3.9 4.8  CL 99 99 111  CO2 '26 27 25  '$ BUN 6* 9 26*  CREATININE 0.57* 0.79 1.60*     Microbiology: Results for orders placed or performed during the hospital encounter of 07/22/18  Urine culture     Status: None   Collection Time: 07/22/18 11:03 PM  Result Value Ref Range Status   Specimen Description   Final    URINE, RANDOM Performed at [facility], [address] [place], [place] [address]    Special Requests   Final    NONE Performed at [facility], [address] [place], [place] [address]    Culture   Final    NO GROWTH Performed at [facility], [place] [address] [place], [place] [address]    Report Status 07/24/2018 FINAL  Final  CULTURE, BLOOD (ROUTINE X 2) w Reflex to ID Panel     Status: None   Collection Time: 07/23/18 10:06 AM  Result Value Ref Range Status   Specimen Description BLOOD BLOOD LEFT ARM  Final   Special Requests   Final    BOTTLES DRAWN AEROBIC AND ANAEROBIC Blood Culture adequate volume   Culture   Final    NO GROWTH 5 DAYS Performed at [facility], [address] [place], [name] [address]    Report Status 07/28/2018 FINAL  Final  CULTURE, BLOOD (ROUTINE X 2) w Reflex to ID Panel     Status: None   Collection Time: 07/23/18 10:17 AM  Result Value Ref Range Status   Specimen Description BLOOD BLOOD LEFT WRIST  Final   Special Requests   Final    BOTTLES DRAWN AEROBIC AND ANAEROBIC Blood Culture adequate volume   Culture   Final    NO GROWTH 5 DAYS Performed at [facility], [address] [place], [place] [address]    Report Status 07/28/2018 FINAL  Final  Gram stain     Status: None   Collection Time: 07/26/18  3:11 PM  Result Value Ref Range Status   Specimen Description CSF  Final   Special Requests NONE  Final   Gram Stain   Final    NO ORGANISMS SEEN WBC SEEN Performed at [facility], [address] [place], [place] [address]    Report Status 07/26/2018 FINAL  Final  CSF culture     Status: None   Collection Time: 07/26/18  3:11 PM  Result Value  Ref Range Status   Specimen Description   Final    CSF Performed at [facility], [address] [place], [place] [address]  Special Requests   Final    NONE Performed at [facility], [address] [place], [place] [address]    Gram Stain   Final    NO ORGANISMS SEEN WBC SEEN NO RBC SEEN Performed at [facility], [address] [place], [place] [address]    Culture   Final    NO GROWTH 3 DAYS Performed at [facility], [place] [address], [place], [place] [address]    Report Status 07/30/2018 FINAL  Final  Fungus Culture With Stain     Status: None (Preliminary result)   Collection Time: 07/26/18  3:11 PM  Result Value Ref Range Status   Fungus Stain Final report  Final    Comment: (NOTE) Performed At: [facility] [place], [place] [id number] Rush Farmer MD PJ:[id number]    Fungus (Mycology) Culture PENDING  Incomplete   Fungal Source CSF  Final    Comment: Performed at [facility], [address] [place], [place] [address]  Fungus Culture Result     Status: None   Collection Time: 07/26/18  3:11 PM  Result Value Ref Range Status   Result 1 Comment  Final    Comment: (NOTE) KOH/Calcofluor preparation:  no fungus observed. Performed At: [facility] [place], [place] [id number] Rush Farmer MD ZH:[id number]   CULTURE, BLOOD (ROUTINE X 2) w Reflex to ID Panel     Status: None (Preliminary result)   Collection Time: 07/29/18  6:14 AM  Result Value Ref Range Status   Specimen Description BLOOD LEFT FOREARM  Final   Special Requests   Final    BOTTLES DRAWN AEROBIC AND ANAEROBIC Blood Culture adequate volume   Culture   Final    NO GROWTH 1 DAY Performed at [facility], [address] [place], [place] [address]    Report Status PENDING  Incomplete  CULTURE, BLOOD (ROUTINE X 2) w Reflex to ID Panel     Status: None (Preliminary result)   Collection Time:  07/29/18  6:17 AM  Result Value Ref Range Status   Specimen Description BLOOD LEFT HAND  Final   Special Requests   Final    BOTTLES DRAWN AEROBIC AND ANAEROBIC Blood Culture adequate volume   Culture   Final    NO GROWTH 1 DAY Performed at [facility], [address], [place], [place] [address]    Report Status PENDING  Incomplete  MRSA PCR Screening     Status: None   Collection Time: 07/29/18  4:18 PM  Result Value Ref Range Status   MRSA by PCR NEGATIVE NEGATIVE Final    Comment:        The GeneXpert MRSA Assay (FDA approved for NASAL specimens only), is one component of a comprehensive MRSA colonization surveillance program. It is not intended to diagnose MRSA infection nor to guide or monitor treatment for MRSA infections. Performed at [facility], [address] [place], [place] [address]     Studies/Results: Dg Chest 1 View  Result Date: 07/29/2018 CLINICAL DATA:  62 year old male with community-acquired pneumonia EXAM: CHEST  1 VIEW COMPARISON:  Prior chest x-ray 07/28/2018 FINDINGS: Cardiac and mediastinal contours are within normal limits. Atherosclerotic calcifications present in the transverse aorta. A metallic stent projects over the left heart. Patchy bibasilar airspace opacities are new over the past 24 hours. Suspect trace left pleural effusion. Remote healed right-sided rib fractures. No acute osseous abnormality. No evidence of pneumothorax. IMPRESSION:  1. New bibasilar patchy airspace opacities. Differential considerations include bilateral pneumonia versus bibasilar atelectasis. 2. Suspect small left pleural effusion. 3.  Aortic Atherosclerosis (ICD10-170.0). Electronically Signed   By: Jacqulynn Cadet M.D.   On: 07/29/2018 12:30   Dg Abd 1 View  Result Date: 07/29/2018 CLINICAL DATA:  NG tube placement EXAM: ABDOMEN - 1 VIEW COMPARISON:  None. FINDINGS: Enteric tube terminates in the distal gastric body. Nonobstructive bowel gas  pattern. IMPRESSION: Enteric tube terminates in the distal gastric body. Electronically Signed   By: Julian Hy M.D.   On: 07/29/2018 20:10   Dg Chest Port 1 View  Result Date: 07/30/2018 CLINICAL DATA:  Acute respiratory failure. EXAM: PORTABLE CHEST 1 VIEW COMPARISON:  Chest x-ray 07/29/2018 FINDINGS: The cardiac silhouette, mediastinal and hilar contours are within normal limits and stable. The endotracheal tube is 2.1 cm above the carina. The NG tube is stable. Slightly low lung volumes with vascular crowding and streaky basilar atelectasis. No edema or pneumothorax. IMPRESSION: Support apparatus in good position, unchanged. Slightly lower lung volumes with streaky basilar atelectasis. Electronically Signed   By: Marijo Sanes M.D.   On: 07/30/2018 10:24   Dg Chest Port 1 View  Result Date: 07/29/2018 CLINICAL DATA:  Status post intubation. EXAM: PORTABLE CHEST 1 VIEW COMPARISON:  Earlier film, same date. FINDINGS: The endotracheal tube is 2.6 cm above the carina. The NG tube is coursing down the esophagus and into the stomach. The heart is normal in size and stable. Stable coronary artery stents. The lungs show improved aeration post intubation with improved bibasilar aeration and resolving bibasilar atelectasis. No pulmonary edema. IMPRESSION: The endotracheal tube and NG tubes are in good position. Improved basilar lung aeration post intubation. Electronically Signed   By: Marijo Sanes M.D.   On: 07/29/2018 20:07    Assessment/Plan: Maurice Patterson is a 62 y.o. male admitted with confusion, weakness, possible PNA. Remains confused. Has had MRI x 2, CT of brain with nothing acute. Seen by neurology. EEG with diffuse slowing. Utox neg, Flu neg, HIV neg, RPR neg, TSH neg, UA negative, abg unimpressive. CSF with very mild elevated wbc and protein.  I do not think he has meningitis or encephalitis and certainly does not have a bacterial meningitis with such a low wbc. He follows with  neurology as otpt for transient memory loss and expressive aphasia in past, hx of stroke and seizures.  I suspect current presentation is multifactorial.  12/7 high fever last night, diaphoretic, remains on O2 with increased resp distress today. Remains lethargic 12/8 transferred to unit for resp distress, cxr with bibasilar infiltrates, likely aspiration. wbc elevated.   Recommendations Can cont acyclovir pending HSV results - if neg PCR though can dc Change meropenem to zosyn for aspiration PNA  Can dc vanco since MRSA PCR negative making MRSA pna very unlikely - he has also had increasing cr Cont vent management per pulm  Cont neuro work up.   Thank you very much for the consult. Will follow with you.  Maurice Patterson   07/30/2018, 12:04 PM

## 2018-07-30 NOTE — Progress Notes (Signed)
Pharmacy Antibiotic Note  Maurice Patterson is a 62 y.o. male admitted on 07/22/2018 with rule out meningitis.  Pharmacy has been consulted for Zosyn and acyclovir dosing. Per notes, patient has h/o HSV encephalitis. ID is continuing acyclovir pending HSV results - if neg PCR though can dc. Meropenem changed to Zosyn for aspiration PNA, vancomycin discontinued. He has a penicillin allergy but tolerated ampicillin on this admission.  Plan:  Start Zosyn for aspiration PNA 3.375g EI every 8 hours  Continue acyclovir 10mg /kg (700mg ) IV q12h for worsening renal function- approved by Infectious disease   Need to follow up on HSV PCR from CSF - still in process   Height: 5\' 6"  (167.6 cm) Weight: 152 lb 5.4 oz (69.1 kg) IBW/kg (Calculated) : 63.8  Temp (24hrs), Avg:98.2 F (36.8 C), Min:96.7 F (35.9 C), Max:99.6 F (37.6 C)  Recent Labs  Lab 07/24/18 0805 07/25/18 0310 07/27/18 0505 07/28/18 0519 07/29/18 0324 07/30/18 0501  WBC 10.7*  --  8.9 10.5  --  19.4*  CREATININE 0.67 0.61 0.53* 0.57* 0.79 1.60*  VANCOTROUGH  --   --  <4*  --   --   --     Estimated Creatinine Clearance: 43.8 mL/min (A) (by C-G formula based on SCr of 1.6 mg/dL (H)).     Antimicrobials this admission: 12/3 vanco >> 12/5, 12/7 >>12/8 12/3 ceftriaxone >> 12/5 12/3 acyclovir >> 12/3 ampicillin >> 12/5 12/1 azithro >>12/3 12/7 meropenem >>12/8 Zosyn 12/8>>  Microbiology results: 12/1 BCx: NG 11/30 UCx: NG 12/2 Influenza: neg 12/3 CSF: 12/3 HSV PCR:  12/7 BCx: NGTD MRSA PCR neg  Thank you for allowing pharmacy to be a part of this patient's care.  Vallery Sa, PharmD Clinical Pharmacist 07/30/2018 12:22 PM

## 2018-07-30 NOTE — Progress Notes (Signed)
PHARMACY - ADULT TOTAL PARENTERAL NUTRITION CONSULT NOTE   Pharmacy Consult for TPN Indication: patient/family preference/unable to keep NGT in place  Patient Measurements: Height: 5\' 6"  (167.6 cm) Weight: 152 lb 5.4 oz (69.1 kg) IBW/kg (Calculated) : 63.8 TPN AdjBW (KG): 69.1 Body mass index is 24.59 kg/m.  Assessment: 62 yo male being worked up for meningitis LP pending, ID and neurology following. Family insistent on nutrition and team concerned patient could not keep NG in place for enteral feeds. RD says this patient is not an ideal candidate for parenteral nutrition but gave recommendations for starting and goals. PICC was to be placed yesterday but due to multiple drug allergies it was not completed. VAS/IV team recommended IR or MD insertion.   Pt transferred to ICU 12/7   Endo: BG 200s now up to 369 on insulin Lytes: K 4.8, Mag 2.1, Phos 3.4, Ca 8.1, Alb 2.3, CorrCa 9.5   TPN Access: pending TPN start date: pending Nutritional Goals (per RD recommendation on 07/27/2018): Kcal:  1845-2150 (MSJ x 1.2-1.4) Protein:  90-105 grams (1.2-1.4 grams/kg) Fluid:  1.8-2.1 L/day (1 mL/kcal)  Goal TPN rate is 83 ml/hr (provides nutrition KCal: 1894, Protein: 100 g, Fluid: 1992 mL+ 240 mL from lipids)  Current Nutrition: NPO  Plan: Electrolytes WNL  Pt transferred to ICU and starting on tube feeds and insulin drip TPN consult d/c'd per discussion with ICU MD  Rocky Morel, PharmD, BCPS Clinical Pharmacist 07/30/2018,8:14 AM

## 2018-07-30 NOTE — Progress Notes (Addendum)
Pharmacy Antibiotic Note  Maurice Patterson is a 62 y.o. male admitted on 07/22/2018 with rule out meningitis.  Pharmacy has been consulted for vancomycin, ceftriaxone, ampicillin and acyclovir dosing. Per notes, patient has h/o HSV encephalitis.    Plan:  Acyclovir 10mg /kg (700mg ) IV q8h changed to q12h for worsening renal function- approved by Infectious disease  Need to follow up on HSV PCR from CSF - still in process   Height: 5\' 6"  (167.6 cm) Weight: 152 lb 5.4 oz (69.1 kg) IBW/kg (Calculated) : 63.8  Temp (24hrs), Avg:98.1 F (36.7 C), Min:96.7 F (35.9 C), Max:99.6 F (37.6 C)  Recent Labs  Lab 07/24/18 0805 07/25/18 0310 07/27/18 0505 07/28/18 0519 07/29/18 0324 07/30/18 0501  WBC 10.7*  --  8.9 10.5  --  19.4*  CREATININE 0.67 0.61 0.53* 0.57* 0.79 1.60*  VANCOTROUGH  --   --  <4*  --   --   --     Estimated Creatinine Clearance: 43.8 mL/min (A) (by C-G formula based on SCr of 1.6 mg/dL (H)).    Allergies  Allergen Reactions  . Hydrocodone Anaphylaxis  . Morphine Other (See Comments)    Loss of memory  . Ambien [Zolpidem] Other (See Comments)    delirium    . Brilinta [Ticagrelor] Other (See Comments)    Stroke   . Flexeril [Cyclobenzaprine] Other (See Comments)    delerium   . Flunitrazepam Other (See Comments)    ROHYPNOL (hallucinations)  . Haldol [Haloperidol Lactate] Other (See Comments)    delerium   . Levetiracetam Diarrhea and Other (See Comments)    Unable to walk  . Lorazepam Hives  . Risperdal [Risperidone] Other (See Comments)    Delirium   . Trazodone Other (See Comments)    Delirium Can take in low doses   . Benadryl [Diphenhydramine Hcl (Sleep)] Rash  . Penicillins Rash    Mouth ulcers Has patient had a PCN reaction causing immediate rash, facial/tongue/throat swelling, SOB or lightheadedness with hypotension: Yes Has patient had a PCN reaction causing severe rash involving mucus membranes or skin necrosis: No Has patient had  a PCN reaction that required hospitalization No Has patient had a PCN reaction occurring within the last 10 years: Yes If all of the above answers are "NO", then may proceed with Cephalosporin use.    Antimicrobials this admission: 12/3 vanco >> 12/5, 12/7 >> 12/3 ceftriaxone >> 12/5 12/3 acyclovir >> 12/3 ampicillin >> 12/5 12/1 azithro >>12/3 12/7 meropenem >>  Dose adjustments this admission:   Microbiology results: 12/1 BCx: NG 11/30 UCx: NG 12/2 Influenza: neg 12/3 CSF: 12/3 HSV PCR:  12/7 BCx: NGTD MRSA PCR neg  Thank you for allowing pharmacy to be a part of this patient's care.  Rayna Sexton, PharmD, BCPS Clinical Pharmacist 07/30/2018 8:27 AM

## 2018-07-30 NOTE — Progress Notes (Signed)
Nutrition Follow-up  DOCUMENTATION CODES:   Not applicable  INTERVENTION:  Initiate Vital AF 1.2 at 60 mL/hr (1440 mL goal daily volume) per OGT. Provides 1728 kcal, 108 grams of protein, 1166 mL H2O daily.  Goal TF regimen meets 100% RDIs for vitamins/minerals.  Continue free water flush of 30 Q4hrs to maintain tube patency. Patient currently receiving IV fluids of normal saline with KCl 20 mEq/L at 75 mL/hr.  Patient is at risk for refeeding. Continue monitoring potassium, phosphorus, and magnesium and replacing as needed.  NUTRITION DIAGNOSIS:   Inadequate oral intake related to (acute metabolic encephalopathy) as evidenced by meal completion < 25%.  Ongoing - patient now intubated and sedated.  GOAL:   Provide needs based on ASPEN/SCCM guidelines  Met with TF regimen.  MONITOR:   Vent status, Labs, Weight trends, TF tolerance, I & O's  REASON FOR ASSESSMENT:   Ventilator, Consult Enteral/tube feeding initiation and management  ASSESSMENT:   62 year old male with PMHx of seizures, dementia, arthritis, HTN, DM, hx TIA, CAD, CHF, HLD, pancreatitis, CKD, hx CVA 08/2017 who is admitted with PNA, acute metabolic encephalopathy with prior history of HSV encephalitis s/p lumbar puncture on 12/4.   -Patient was seen by this RD for initial assessment on 12/5. Received consult for TPN initiation. Patient was not an ideal candidate for TPN but patient's family was requesting nutrition be started and team felt he would not keep an NGT in place. TPN was not initiated as PICC line could not be placed. -Overnight patient developed progressive respiratory failure with presumptive aspiration. He transferred to ICU and was initially placed on BiPAP but later required intubation.  Patient now intubated and sedated. On PRVC mode with FiO2 30% and PEEP 5 cmH2O. No family members at bedside at time of RD assessment. Abdomen is soft. Patient was just started on adult TF protocol a few hours ago  and is tolerating so far. Per chart last BM was on 12/5. Today was day 8 of NPO status so patient will be at risk of refeeding syndrome with initiation of tube feeds.  Enteral Access: 16 Fr. OGT placed 12/7; terminates in distal gastric body per abdominal x-ray 12/7; 66 cm at corner of mouth  MAP: 67-83 mmHg  TF: pt was initiated on Vital High Protein at 40 mL/hr + Pro-Stat 30 mL BID this AM per adult TF protocol  Patient is currently intubated on ventilator support Ve: 9.4 L/min Temp (24hrs), Avg:98.1 F (36.7 C), Min:96.7 F (35.9 C), Max:99.6 F (37.6 C)  Propofol: N/A  Medications reviewed and include: lactulose 20 grams daily, Solu-Medrol 60 mg Q24hrs IV, pantoprazole, NS with KCl 20 mEq/L at 75 mL/hr, acyclovir, fentanyl gtt, regular insulin gtt at 5.5 units/hr, norepinephrine gtt now off.  Labs reviewed: CBG 223-386, BUN 26, Creatinine 1.6.  I/O: 1315 mL UOP yesterday (0.8 mL/kg/hr) + 6 occurrences unmeasured UOP  Weight trend: 69.1 kg on 12/7; -6.2 kg from 12/5, unsure this is accurate  Discussed with RN.  Diet Order:   Diet Order            Diet NPO time specified  Diet effective now             EDUCATION NEEDS:   Not appropriate for education at this time  Skin:  Skin Assessment: Reviewed RN Assessment  Last BM:  07/27/2018 - large type 7  Height:   Ht Readings from Last 1 Encounters:  07/29/18 5' 6" (1.676 m)   Weight:  Wt Readings from Last 1 Encounters:  07/29/18 69.1 kg   Ideal Body Weight:  70 kg  BMI:  Body mass index is 24.59 kg/m.  Estimated Nutritional Needs:   Kcal:  6568 (PSU 2003b w/ MSJ 1468, Ve 9.4, Tmax 37.6)  Protein:  85-105 grams (1.2-1.5 grams/kg)  Fluid:  1.8-2.1 L/day (1 mL/kcal)  Willey Blade, MS, RD, LDN Office: [phone] Pager: [phone] After Hours/Weekend Pager: [phone]

## 2018-07-30 NOTE — Progress Notes (Signed)
[facility] NAME: Maurice Patterson    MR#:  [id number]  DATE OF BIRTH:  [birth year]  SUBJECTIVE:  CHIEF COMPLAINT:  No chief complaint on file.  Remained in altered mental state and had aspiration and pneumonia so intubated. BP was low- now on levophed drip.  REVIEW OF SYSTEMS:  Review of Systems  Unable to perform ROS: Mental status change    DRUG ALLERGIES:   Allergies  Allergen Reactions  . Hydrocodone Anaphylaxis  . Morphine Other (See Comments)    Loss of memory  . Ambien [Zolpidem] Other (See Comments)    delirium    . Brilinta [Ticagrelor] Other (See Comments)    Stroke   . Flexeril [Cyclobenzaprine] Other (See Comments)    delerium   . Flunitrazepam Other (See Comments)    ROHYPNOL (hallucinations)  . Haldol [Haloperidol Lactate] Other (See Comments)    delerium   . Levetiracetam Diarrhea and Other (See Comments)    Unable to walk  . Lorazepam Hives  . Risperdal [Risperidone] Other (See Comments)    Delirium   . Trazodone Other (See Comments)    Delirium Can take in low doses   . Benadryl [Diphenhydramine Hcl (Sleep)] Rash  . Penicillins Rash    Mouth ulcers Has patient had a PCN reaction causing immediate rash, facial/tongue/throat swelling, SOB or lightheadedness with hypotension: Yes Has patient had a PCN reaction causing severe rash involving mucus membranes or skin necrosis: No Has patient had a PCN reaction that required hospitalization No Has patient had a PCN reaction occurring within the last 10 years: Yes If all of the above answers are "NO", then may proceed with Cephalosporin use.    VITALS:  Blood pressure 109/62, pulse (!) 109, temperature 98.9 F (37.2 C), temperature source Oral, resp. rate 15, height 5\' 6"  (1.676 m), weight 69.1 kg, SpO2 96 %.  PHYSICAL EXAMINATION:  Physical Exam  GENERAL:  62 y.o.-year-old patient lying in the bed with no acute distress.  EYES: Pupils equal,  round, reactive to light and accommodation. No scleral icterus. Extraocular muscles intact.  HEENT: Head atraumatic, normocephalic. Oropharynx and nasopharynx clear. Dry mucus membranes NECK:  Supple, no jugular venous distention. No thyroid enlargement, no tenderness.  LUNGS: Normal breath sounds bilaterally, increased rate, no wheezing, some crepitation.  Positive use of accessory muscles of respiration. Decreased bibasilar breath sounds.  Over the mouth mask present due to open mouth breathing. CARDIOVASCULAR: S1, S2 normal. No murmurs, rubs, or gallops.  ABDOMEN: Soft, nontender, nondistended. Bowel sounds present. No organomegaly or mass.  EXTREMITIES: No pedal edema, cyanosis, or clubbing.  NEUROLOGIC:  No obvious facial droop- lethargic, stiffer left upper extremity, opening eyes to tactile stimulation and speaking- but incomprehensible words PSYCHIATRIC:   Not following commands.  Withdrawing to touch. SKIN: No obvious rash, lesion, or ulcer.    LABORATORY PANEL:   CBC Recent Labs  Lab 07/30/18 0501  WBC 19.4*  HGB 11.5*  HCT 35.7*  PLT 285   ------------------------------------------------------------------------------------------------------------------  Chemistries  Recent Labs  Lab 07/30/18 0501  NA 142  K 4.8  CL 111  CO2 25  GLUCOSE 373*  BUN 26*  CREATININE 1.60*  CALCIUM 8.1*  MG 2.1  AST 17  ALT 15  ALKPHOS 117  BILITOT 0.4   ------------------------------------------------------------------------------------------------------------------  Cardiac Enzymes No results for input(s): TROPONINI in the last 168 hours. ------------------------------------------------------------------------------------------------------------------  RADIOLOGY:  Dg Chest 1 View  Result Date: 07/29/2018 CLINICAL DATA:  62 year old  male with community-acquired pneumonia EXAM: CHEST  1 VIEW COMPARISON:  Prior chest x-ray 07/28/2018 FINDINGS: Cardiac and mediastinal contours  are within normal limits. Atherosclerotic calcifications present in the transverse aorta. A metallic stent projects over the left heart. Patchy bibasilar airspace opacities are new over the past 24 hours. Suspect trace left pleural effusion. Remote healed right-sided rib fractures. No acute osseous abnormality. No evidence of pneumothorax. IMPRESSION: 1. New bibasilar patchy airspace opacities. Differential considerations include bilateral pneumonia versus bibasilar atelectasis. 2. Suspect small left pleural effusion. 3.  Aortic Atherosclerosis (ICD10-170.0). Electronically Signed   By: Jacqulynn Cadet M.D.   On: 07/29/2018 12:30   Dg Abd 1 View  Result Date: 07/29/2018 CLINICAL DATA:  NG tube placement EXAM: ABDOMEN - 1 VIEW COMPARISON:  None. FINDINGS: Enteric tube terminates in the distal gastric body. Nonobstructive bowel gas pattern. IMPRESSION: Enteric tube terminates in the distal gastric body. Electronically Signed   By: Julian Hy M.D.   On: 07/29/2018 20:10   Dg Chest Port 1 View  Result Date: 07/30/2018 CLINICAL DATA:  Acute respiratory failure. EXAM: PORTABLE CHEST 1 VIEW COMPARISON:  Chest x-ray 07/29/2018 FINDINGS: The cardiac silhouette, mediastinal and hilar contours are within normal limits and stable. The endotracheal tube is 2.1 cm above the carina. The NG tube is stable. Slightly low lung volumes with vascular crowding and streaky basilar atelectasis. No edema or pneumothorax. IMPRESSION: Support apparatus in good position, unchanged. Slightly lower lung volumes with streaky basilar atelectasis. Electronically Signed   By: Marijo Sanes M.D.   On: 07/30/2018 10:24   Dg Chest Port 1 View  Result Date: 07/29/2018 CLINICAL DATA:  Status post intubation. EXAM: PORTABLE CHEST 1 VIEW COMPARISON:  Earlier film, same date. FINDINGS: The endotracheal tube is 2.6 cm above the carina. The NG tube is coursing down the esophagus and into the stomach. The heart is normal in size and  stable. Stable coronary artery stents. The lungs show improved aeration post intubation with improved bibasilar aeration and resolving bibasilar atelectasis. No pulmonary edema. IMPRESSION: The endotracheal tube and NG tubes are in good position. Improved basilar lung aeration post intubation. Electronically Signed   By: Marijo Sanes M.D.   On: 07/29/2018 20:07    EKG:   Orders placed or performed during the hospital encounter of 07/22/18  . EKG 12-Lead  . EKG 12-Lead  . EKG 12-Lead  . EKG 12-Lead  . ED EKG  . ED EKG    ASSESSMENT AND PLAN:   62 year old male with past medical history significant for CAD, CHF, CKD, dementia, diabetes, hypertension and seizure disorder presents to hospital secondary to worsening weakness and poor appetite  *  Community-acquired pneumonia-blood cultures are negative. -On antibiotics -Clinically no improvement noted. -On ceftriaxone his chest x-ray was better on July 28, 2018.  *Aspiration pneumonia- ac respi failure with hypoxia  07/29/18-had fever and worsening on chest x-ray. Started on broad-spectrum antibiotics and send repeat blood cultures. Pt is intubated  And on ventilator support.  *  Acute metabolic encephalopathy-CT of the head is negative.   -Possible acute encephalitis.  Prior history of HSV encephalitis. -Lumbar puncture done, no evidence of bacterial meningitis.  Normal WBC and elevated glucose and protein. -HSV PCR tests are pending. (Was not sent initially when LP was done.  Spoke to micro lab and they are sending today.) -ABG with no CO2 retention.  Ammonia is borderline elevated at 38. -MRI of the brain repeated  Twice did not show any acute findings.  Has mild to moderate cerebral atrophy. -No history of seizures.   . -Gram stain is negative.  Cultures on CSF is negative. -Vancomycin and ampicillin are discontinued now.  ID consult is appreciated -Continue acyclovir.  Also Rocephin will be continued for his  pneumonia. -Neurology consulted -Initial EEG without seizure activity but generalized slowing.  Repeat EEG - no seizure like activities.  3.  Hypokalemia- replaced.  4.  Diabetes mellitus-on Lantus and sliding scale insulin.  as remains lethargic and unable to eat- continue low dose of Lantus.  5.  History of seizure disorder/myoclonic jerks-on Lamictal. -According to wife, patient does not have any history of seizures -EEG with generalized slowing, no seizure-like activity.  Empirically Vimpat was added by neurology  6.  DVT prophylaxis-on Lovenox   7. Nutrition- Failed attempt for PICC line initially.  Now on vent, Feeding started. May need to have arrangements for long term feeding.  Updated wife and sister today.   All the records are reviewed and case discussed with Care Management/Social Workerr. Management plans discussed with the patient, family and they are in agreement.  CODE STATUS: Full Code  TOTAL TIME TAKING CARE OF THIS PATIENT: 35 minutes.   POSSIBLE D/C IN 2 DAYS, DEPENDING ON CLINICAL CONDITION.   Vaughan Basta M.D on 07/30/2018 at 9:44 PM  Between 7am to 6pm - Pager - [phone]  After 6pm go to [email or web address] - password [email or web address] [facility]  Office  [phone]  CC: Primary care physician; [name], MD

## 2018-07-30 NOTE — Progress Notes (Addendum)
Pharmacy Antibiotic Note  Maurice Patterson is a 62 y.o. male admitted on 07/22/2018 with meningitis.  Pharmacy has been consulted for vanc/meropenem dosing. Patient was on vanc previously from 12/3 - 12/5 and went all of 12/6 without any vanc. Patient now being restarted on vanc.   Current regimen: Will start vanc 1.5g IV q12h  Will draw trough 12/8 @ 1700 prior to 4th dose.  Ke 0.0823 T1/2 ~ 12 hrs Goal trough 15 - 20 mcg/mL   Plan: Scr has doubled (0.79>1.60). Will change regimen to vancomycin 1250 mg IV q24h for expected trough ~16. Will check a random level at about 24h interval since last dose (today at 1800) to ensure pt has been clearing previous vanc doses and time new order to start 2h after random level (to continue if random level <20 mcg/ml).   Ke 0.041, half life 17h, Vd 48.4 L  Change meropenem from 1 g IV q8h to 1 g IV q12h.    Height: 5\' 6"  (167.6 cm) Weight: 152 lb 5.4 oz (69.1 kg) IBW/kg (Calculated) : 63.8  Temp (24hrs), Avg:98.1 F (36.7 C), Min:96.7 F (35.9 C), Max:99.6 F (37.6 C)  Recent Labs  Lab 07/24/18 0805 07/25/18 0310 07/27/18 0505 07/28/18 0519 07/29/18 0324 07/30/18 0501  WBC 10.7*  --  8.9 10.5  --  19.4*  CREATININE 0.67 0.61 0.53* 0.57* 0.79 1.60*  VANCOTROUGH  --   --  <4*  --   --   --     Estimated Creatinine Clearance: 43.8 mL/min (A) (by C-G formula based on SCr of 1.6 mg/dL (H)).    Allergies  Allergen Reactions  . Hydrocodone Anaphylaxis  . Morphine Other (See Comments)    Loss of memory  . Ambien [Zolpidem] Other (See Comments)    delirium    . Brilinta [Ticagrelor] Other (See Comments)    Stroke   . Flexeril [Cyclobenzaprine] Other (See Comments)    delerium   . Flunitrazepam Other (See Comments)    ROHYPNOL (hallucinations)  . Haldol [Haloperidol Lactate] Other (See Comments)    delerium   . Levetiracetam Diarrhea and Other (See Comments)    Unable to walk  . Lorazepam Hives  . Risperdal [Risperidone]  Other (See Comments)    Delirium   . Trazodone Other (See Comments)    Delirium Can take in low doses   . Benadryl [Diphenhydramine Hcl (Sleep)] Rash  . Penicillins Rash    Mouth ulcers Has patient had a PCN reaction causing immediate rash, facial/tongue/throat swelling, SOB or lightheadedness with hypotension: Yes Has patient had a PCN reaction causing severe rash involving mucus membranes or skin necrosis: No Has patient had a PCN reaction that required hospitalization No Has patient had a PCN reaction occurring within the last 10 years: Yes If all of the above answers are "NO", then may proceed with Cephalosporin use.     12/3 vanco >> 12/5, 12/7 >> 12/3 ceftriaxone >> 12/5 12/3 acyclovir >> 12/3 ampicillin >> 12/5 12/1 azithro >>12/3 12/7 meropenem >>  Microbiology results: 12/1 BCx: NG 11/30 UCx: NG 12/2 Influenza: neg 12/3 CSF: 12/3 HSV PCR:  12/7 BCx: NGTD MRSA PCR neg  Thank you for allowing pharmacy to be a part of this patient's care.  Rayna Sexton, PharmD, BCPS Clinical Pharmacist 07/30/2018 8:32 AM

## 2018-07-31 ENCOUNTER — Inpatient Hospital Stay (HOSPITAL_COMMUNITY)
Admit: 2018-07-31 | Discharge: 2018-07-31 | Disposition: A | Discharge status: Home / Self Care | Payer: PPO | Attending: Pulmonary Disease | Admitting: Pulmonary Disease

## 2018-07-31 ENCOUNTER — Ambulatory Visit: Payer: PPO | Admitting: Adult Health

## 2018-07-31 ENCOUNTER — Inpatient Hospital Stay: Payer: PPO

## 2018-07-31 DIAGNOSIS — G9341 Metabolic encephalopathy: Secondary | ICD-10-CM

## 2018-07-31 DIAGNOSIS — G92 Toxic encephalopathy: Secondary | ICD-10-CM

## 2018-07-31 DIAGNOSIS — J181 Lobar pneumonia, unspecified organism: Secondary | ICD-10-CM

## 2018-07-31 DIAGNOSIS — J9601 Acute respiratory failure with hypoxia: Secondary | ICD-10-CM

## 2018-07-31 DIAGNOSIS — N170 Acute kidney failure with tubular necrosis: Secondary | ICD-10-CM

## 2018-07-31 DIAGNOSIS — R011 Cardiac murmur, unspecified: Secondary | ICD-10-CM

## 2018-07-31 LAB — GLUCOSE, CAPILLARY
GLUCOSE-CAPILLARY: 115 mg/dL — AB (ref 70–99)
GLUCOSE-CAPILLARY: 131 mg/dL — AB (ref 70–99)
Glucose-Capillary: 109 mg/dL — ABNORMAL HIGH (ref 70–99)
Glucose-Capillary: 122 mg/dL — ABNORMAL HIGH (ref 70–99)
Glucose-Capillary: 129 mg/dL — ABNORMAL HIGH (ref 70–99)
Glucose-Capillary: 130 mg/dL — ABNORMAL HIGH (ref 70–99)
Glucose-Capillary: 135 mg/dL — ABNORMAL HIGH (ref 70–99)
Glucose-Capillary: 135 mg/dL — ABNORMAL HIGH (ref 70–99)
Glucose-Capillary: 139 mg/dL — ABNORMAL HIGH (ref 70–99)
Glucose-Capillary: 142 mg/dL — ABNORMAL HIGH (ref 70–99)
Glucose-Capillary: 143 mg/dL — ABNORMAL HIGH (ref 70–99)
Glucose-Capillary: 150 mg/dL — ABNORMAL HIGH (ref 70–99)
Glucose-Capillary: 159 mg/dL — ABNORMAL HIGH (ref 70–99)
Glucose-Capillary: 160 mg/dL — ABNORMAL HIGH (ref 70–99)
Glucose-Capillary: 164 mg/dL — ABNORMAL HIGH (ref 70–99)
Glucose-Capillary: 168 mg/dL — ABNORMAL HIGH (ref 70–99)
Glucose-Capillary: 173 mg/dL — ABNORMAL HIGH (ref 70–99)
Glucose-Capillary: 174 mg/dL — ABNORMAL HIGH (ref 70–99)
Glucose-Capillary: 175 mg/dL — ABNORMAL HIGH (ref 70–99)
Glucose-Capillary: 180 mg/dL — ABNORMAL HIGH (ref 70–99)
Glucose-Capillary: 193 mg/dL — ABNORMAL HIGH (ref 70–99)
Glucose-Capillary: 222 mg/dL — ABNORMAL HIGH (ref 70–99)
Glucose-Capillary: 231 mg/dL — ABNORMAL HIGH (ref 70–99)

## 2018-07-31 LAB — OLIGOCLONAL BANDS, CSF + SERM

## 2018-07-31 LAB — BASIC METABOLIC PANEL
Anion gap: 5 (ref 5–15)
BUN: 28 mg/dL — AB (ref 8–23)
CO2: 28 mmol/L (ref 22–32)
Calcium: 8.2 mg/dL — ABNORMAL LOW (ref 8.9–10.3)
Chloride: 117 mmol/L — ABNORMAL HIGH (ref 98–111)
Creatinine, Ser: 1.59 mg/dL — ABNORMAL HIGH (ref 0.61–1.24)
GFR calc Af Amer: 54 mL/min — ABNORMAL LOW (ref >60)
GFR calc non Af Amer: 46 mL/min — ABNORMAL LOW (ref >60)
Glucose, Bld: 127 mg/dL — ABNORMAL HIGH (ref 70–99)
Potassium: 4.3 mmol/L (ref 3.5–5.1)
Sodium: 150 mmol/L — ABNORMAL HIGH (ref 135–145)

## 2018-07-31 LAB — PHOSPHORUS: Phosphorus: 1.9 mg/dL — ABNORMAL LOW (ref 2.5–4.6)

## 2018-07-31 LAB — CBC
HCT: 33.9 % — ABNORMAL LOW (ref 39.0–52.0)
Hemoglobin: 10.7 g/dL — ABNORMAL LOW (ref 13.0–17.0)
MCH: 28.9 pg (ref 26.0–34.0)
MCHC: 31.6 g/dL (ref 30.0–36.0)
MCV: 91.6 fL (ref 80.0–100.0)
Platelets: 294 10*3/uL (ref 150–400)
RBC: 3.7 MIL/uL — ABNORMAL LOW (ref 4.22–5.81)
RDW: 15 % (ref 11.5–15.5)
WBC: 18.6 10*3/uL — ABNORMAL HIGH (ref 4.0–10.5)
nRBC: 0 % (ref 0.0–0.2)

## 2018-07-31 LAB — ECHOCARDIOGRAM COMPLETE
Height: 66 in
Weight: 2437.41 oz

## 2018-07-31 LAB — PROCALCITONIN: Procalcitonin: 0.76 ng/mL

## 2018-07-31 LAB — MAGNESIUM: MAGNESIUM: 2.3 mg/dL (ref 1.7–2.4)

## 2018-07-31 MED ORDER — DEXMEDETOMIDINE HCL IN NACL 400 MCG/100ML IV SOLN
0.4000 ug/kg/h | INTRAVENOUS | Status: DC
  Administered 2018-07-31: 0.4 ug/kg/h via INTRAVENOUS
  Administered 2018-07-31: 0.5 ug/kg/h via INTRAVENOUS
  Administered 2018-08-01 – 2018-08-02 (×2): 0.4 ug/kg/h via INTRAVENOUS
  Filled 2018-07-31 (×4): qty 100

## 2018-07-31 MED ORDER — SENNOSIDES 8.8 MG/5ML PO SYRP
5.0000 mL | ORAL_SOLUTION | Freq: Every day | ORAL | Status: DC
  Administered 2018-07-31 – 2018-08-01 (×2): 5 mL
  Filled 2018-07-31 (×4): qty 5

## 2018-07-31 MED ORDER — POLYETHYLENE GLYCOL 3350 17 G PO PACK
17.0000 g | PACK | Freq: Every day | ORAL | Status: DC
  Administered 2018-07-31 – 2018-08-01 (×2): 17 g
  Filled 2018-07-31 (×2): qty 1

## 2018-07-31 MED ORDER — METOPROLOL TARTRATE 25 MG PO TABS
25.0000 mg | ORAL_TABLET | Freq: Two times a day (BID) | ORAL | Status: DC
  Administered 2018-08-01 – 2018-09-08 (×67): 25 mg
  Filled 2018-07-31 (×69): qty 1

## 2018-07-31 NOTE — Progress Notes (Signed)
Pharmacy Antibiotic Note  Maurice Patterson is a 62 y.o. male admitted on 07/22/2018 with rule out meningitis.  Pharmacy has been consulted for Zosyn and acyclovir dosing. Per notes, patient has h/o HSV encephalitis. ID is continuing acyclovir pending HSV results - if neg PCR though can dc. Meropenem changed to Zosyn for aspiration PNA, vancomycin discontinued. He has a penicillin allergy but tolerated ampicillin on this admission.  Plan: Continue  Zosyn for aspiration PNA 3.375g EI every 8 hours HSV 1/2 DNA  Negative. D/C acyclovir per ID.    Height: 5\' 6"  (167.6 cm) Weight: 152 lb 5.4 oz (69.1 kg) IBW/kg (Calculated) : 63.8  Temp (24hrs), Avg:99.3 F (37.4 C), Min:98.7 F (37.1 C), Max:100.8 F (38.2 C)  Recent Labs  Lab 07/27/18 0505 07/28/18 0519 07/29/18 0324 07/30/18 0501 07/31/18 0528  WBC 8.9 10.5  --  19.4* 18.6*  CREATININE 0.53* 0.57* 0.79 1.60* 1.59*  VANCOTROUGH <4*  --   --   --   --     Estimated Creatinine Clearance: 44 mL/min (A) (by C-G formula based on SCr of 1.59 mg/dL (H)).     Antimicrobials this admission: 12/3 vanco >> 12/5, 12/7 >>12/8 12/3 ceftriaxone >> 12/5 12/3 acyclovir >> 12/9  12/3 ampicillin >> 12/5 12/1 azithro >>12/3 12/7 meropenem >>12/8 Zosyn 12/8>>  Microbiology results: 12/1 BCx: NG 11/30 UCx: NG 12/2 Influenza: neg 12/3 CSF: 12/3 HSV PCR:  12/7 BCx: NGTD MRSA PCR neg  Thank you for allowing pharmacy to be a part of this patient's care.  Pernell Dupre, PharmD, BCPS Clinical Pharmacist 07/31/2018 12:19 PM

## 2018-07-31 NOTE — Progress Notes (Signed)
[place] INFECTIOUS DISEASE PROGRESS NOTE Date of Admission:  07/22/2018     ID: Maurice Patterson is a 62 y.o. male with pneumonia and AMS  Principal Problem:   CAP (community acquired pneumonia) Active Problems:   Uncontrolled type 2 diabetes mellitus with hyperglycemia, with long-term current use of insulin (HCC)   Essential hypertension   Alzheimer's dementia ([place])   GERD (gastroesophageal reflux disease)   Fever   Acute respiratory failure (HCC)   Subjective: Remains intubated but comfortable on vent.  Febrile to 100.8, wbc up to 18 but on steroids  ROS  Unable to obtain  Medications:  Antibiotics Given (last 72 hours)    Date/Time Action Medication Dose Rate   07/28/18 1634 New Bag/Given   acyclovir (ZOVIRAX) 700 mg in dextrose 5 % 100 mL IVPB 700 mg 114 mL/hr   07/29/18 0133 New Bag/Given   acyclovir (ZOVIRAX) 700 mg in dextrose 5 % 100 mL IVPB 700 mg 114 mL/hr   07/29/18 0705 New Bag/Given   vancomycin (VANCOCIN) 1,500 mg in sodium chloride 0.9 % 500 mL IVPB 1,500 mg 250 mL/hr   07/29/18 1452 New Bag/Given   meropenem (MERREM) 1 g in sodium chloride 0.9 % 100 mL IVPB 1 g 200 mL/hr   07/29/18 1912 New Bag/Given   vancomycin (VANCOCIN) 1,500 mg in sodium chloride 0.9 % 500 mL IVPB 1,500 mg 250 mL/hr   07/29/18 2239 New Bag/Given   meropenem (MERREM) 1 g in sodium chloride 0.9 % 100 mL IVPB 1 g 200 mL/hr   07/30/18 0004 New Bag/Given   acyclovir (ZOVIRAX) 700 mg in dextrose 5 % 100 mL IVPB 700 mg 114 mL/hr   07/30/18 0514 New Bag/Given   meropenem (MERREM) 1 g in sodium chloride 0.9 % 100 mL IVPB 1 g 200 mL/hr   07/30/18 0732 New Bag/Given   acyclovir (ZOVIRAX) 700 mg in dextrose 5 % 100 mL IVPB 700 mg 114 mL/hr   07/30/18 1418 New Bag/Given   piperacillin-tazobactam (ZOSYN) IVPB 3.375 g 3.375 g 12.5 mL/hr   07/30/18 2334 New Bag/Given   piperacillin-tazobactam (ZOSYN) IVPB 3.375 g 3.375 g 12.5 mL/hr   07/31/18 2585 New Bag/Given   piperacillin-tazobactam  (ZOSYN) IVPB 3.375 g 3.375 g 12.5 mL/hr     . aspirin  81 mg Oral Daily  . chlorhexidine gluconate (MEDLINE KIT)  15 mL Mouth Rinse BID  . donepezil  10 mg Oral BID  . enoxaparin (LOVENOX) injection  40 mg Subcutaneous Q24H  . ipratropium-albuterol  3 mL Nebulization Q4H  . lamoTRIgine  100 mg Oral BID  . lidocaine  1 patch Transdermal Q24H  . mouth rinse  15 mL Mouth Rinse 10 times per day  . memantine  10 mg Oral BID  . [START ON 08/01/2018] metoprolol tartrate  25 mg Per Tube BID  . pantoprazole (PROTONIX) IV  40 mg Intravenous QHS  . polyethylene glycol  17 g Per Tube Daily  . sennosides  5 mL Per Tube QHS    Objective: Vital signs in last 24 hours: Temp:  [98.7 F (37.1 C)-100.8 F (38.2 C)] 100.8 F (38.2 C) (12/09 0800) Pulse Rate:  [98-113] 98 (12/09 1200) Resp:  [13-20] 15 (12/09 1200) BP: (94-152)/(52-80) 105/60 (12/09 1200) SpO2:  [94 %-100 %] 94 % (12/09 1200) FiO2 (%):  [30 %] 30 % (12/09 1239) Physical Exam  Constitutional: intubated, sedated HENT: aniceric, t reactive Mouth/Throat: ETT in place Cardiovascular: Normal rate, regular rhythm and normal heart sounds.  Pulmonary/Chest: bil  rhonchi and poor air movement Abdominal: Soft. Bowel sounds are normal. He exhibits no distension. There is no tenderness.  Lymphadenopathy: He has no cervical adenopathy.  Neurological: sedated  R groin TLC Skin: Skin is warm and dry. No rash noted. No erythema.  Psychiatric: min responsive Ext trace edema bil  Lab Results Recent Labs    07/30/18 0501 07/31/18 0528  WBC 19.4* 18.6*  HGB 11.5* 10.7*  HCT 35.7* 33.9*  NA 142 150*  K 4.8 4.3  CL 111 117*  CO2 25 28  BUN 26* 28*  CREATININE 1.60* 1.59*    Microbiology: Results for orders placed or performed during the hospital encounter of 07/22/18  Urine culture     Status: None   Collection Time: 07/22/18 11:03 PM  Result Value Ref Range Status   Specimen Description   Final    URINE, RANDOM Performed at  [facility], [address] [place], [place] [address]    Special Requests   Final    NONE Performed at [facility], [address], [place], [place] [address]    Culture   Final    NO GROWTH Performed at [facility], [place] [address] [place], [place] [address]    Report Status 07/24/2018 FINAL  Final  CULTURE, BLOOD (ROUTINE X 2) w Reflex to ID Panel     Status: None   Collection Time: 07/23/18 10:06 AM  Result Value Ref Range Status   Specimen Description BLOOD BLOOD LEFT ARM  Final   Special Requests   Final    BOTTLES DRAWN AEROBIC AND ANAEROBIC Blood Culture adequate volume   Culture   Final    NO GROWTH 5 DAYS Performed at [facility], [address] [place], [place] [address]    Report Status 07/28/2018 FINAL  Final  CULTURE, BLOOD (ROUTINE X 2) w Reflex to ID Panel     Status: None   Collection Time: 07/23/18 10:17 AM  Result Value Ref Range Status   Specimen Description BLOOD BLOOD LEFT WRIST  Final   Special Requests   Final    BOTTLES DRAWN AEROBIC AND ANAEROBIC Blood Culture adequate volume   Culture   Final    NO GROWTH 5 DAYS Performed at [facility], [address] [place], [place] [address]    Report Status 07/28/2018 FINAL  Final  Gram stain     Status: None   Collection Time: 07/26/18  3:11 PM  Result Value Ref Range Status   Specimen Description CSF  Final   Special Requests NONE  Final   Gram Stain   Final    NO ORGANISMS SEEN WBC SEEN Performed at [facility], [address], [place], [place] [address]    Report Status 07/26/2018 FINAL  Final  CSF culture     Status: None   Collection Time: 07/26/18  3:11 PM  Result Value Ref Range Status   Specimen Description   Final    CSF Performed at [facility], [address] [place], [place] [address]    Special Requests   Final    NONE Performed at [facility], [address] [place], [place] [address]    Gram  Stain   Final    NO ORGANISMS SEEN WBC SEEN NO RBC SEEN Performed at [facility], [address], [place], [place] [address]    Culture   Final    NO GROWTH 3 DAYS Performed at [facility], [place] [address] [place], [place] [address]    Report Status 07/30/2018 FINAL  Final  Fungus Culture With Stain     Status: None (Preliminary result)   Collection Time: 07/26/18  3:11 PM  Result Value Ref Range Status   Fungus Stain Final report  Final    Comment: (NOTE) Performed At: [facility] [place], [place] [id number] Rush Farmer MD [id number]    Fungus (Mycology) Culture PENDING  Incomplete   Fungal Source CSF  Final    Comment: Performed at [facility], [address] [place], [place] [address]  Fungus Culture Result     Status: None   Collection Time: 07/26/18  3:11 PM  Result Value Ref Range Status   Result 1 Comment  Final    Comment: (NOTE) KOH/Calcofluor preparation:  no fungus observed. Performed At: [facility] [place], [place] [id number] Rush Farmer MD [id number]   CULTURE, BLOOD (ROUTINE X 2) w Reflex to ID Panel     Status: None (Preliminary result)   Collection Time: 07/29/18  6:14 AM  Result Value Ref Range Status   Specimen Description BLOOD LEFT FOREARM  Final   Special Requests   Final    BOTTLES DRAWN AEROBIC AND ANAEROBIC Blood Culture adequate volume   Culture   Final    NO GROWTH 2 DAYS Performed at [facility], [address] [place], [place] [address]    Report Status PENDING  Incomplete  CULTURE, BLOOD (ROUTINE X 2) w Reflex to ID Panel     Status: None (Preliminary result)   Collection Time: 07/29/18  6:17 AM  Result Value Ref Range Status   Specimen Description BLOOD LEFT HAND  Final   Special Requests   Final    BOTTLES DRAWN AEROBIC AND ANAEROBIC Blood Culture adequate volume   Culture   Final    NO GROWTH 2 DAYS Performed at [facility], [address] [place], [place] [address]    Report Status PENDING  Incomplete  MRSA PCR Screening     Status: None   Collection Time: 07/29/18  4:18 PM  Result Value Ref Range Status   MRSA by PCR NEGATIVE NEGATIVE Final    Comment:        The GeneXpert MRSA Assay (FDA approved for NASAL specimens only), is one component of a comprehensive MRSA colonization surveillance program. It is not intended to diagnose MRSA infection nor to guide or monitor treatment for MRSA infections. Performed at [facility], [address] [place], [place] [address]     Studies/Results: Dg Abd 1 View  Result Date: 07/29/2018 CLINICAL DATA:  NG tube placement EXAM: ABDOMEN - 1 VIEW COMPARISON:  None. FINDINGS: Enteric tube terminates in the distal gastric body. Nonobstructive bowel gas pattern. IMPRESSION: Enteric tube terminates in the distal gastric body. Electronically Signed   By: Julian Hy M.D.   On: 07/29/2018 20:10   Dg Chest Port 1 View  Result Date: 07/31/2018 CLINICAL DATA:  Acute respiratory failure EXAM: PORTABLE CHEST 1 VIEW COMPARISON:  07/30/2018 FINDINGS: Endotracheal tube and NG tube are unchanged. No confluent airspace opacities or effusions. Heart is normal size. No acute bony abnormality. IMPRESSION: No active disease. Electronically Signed   By: Rolm Baptise M.D.   On: 07/31/2018 07:45   Dg Chest Port 1 View  Result Date: 07/30/2018 CLINICAL DATA:  Acute respiratory failure. EXAM: PORTABLE CHEST 1 VIEW COMPARISON:  Chest x-ray 07/29/2018 FINDINGS:  The cardiac silhouette, mediastinal and hilar contours are within normal limits and stable. The endotracheal tube is 2.1 cm above the carina. The NG tube is stable. Slightly low lung volumes with vascular crowding and streaky basilar atelectasis. No edema or pneumothorax. IMPRESSION: Support apparatus in good position, unchanged. Slightly lower lung volumes with streaky basilar atelectasis. Electronically Signed    By: Marijo Sanes M.D.   On: 07/30/2018 10:24   Dg Chest Port 1 View  Result Date: 07/29/2018 CLINICAL DATA:  Status post intubation. EXAM: PORTABLE CHEST 1 VIEW COMPARISON:  Earlier film, same date. FINDINGS: The endotracheal tube is 2.6 cm above the carina. The NG tube is coursing down the esophagus and into the stomach. The heart is normal in size and stable. Stable coronary artery stents. The lungs show improved aeration post intubation with improved bibasilar aeration and resolving bibasilar atelectasis. No pulmonary edema. IMPRESSION: The endotracheal tube and NG tubes are in good position. Improved basilar lung aeration post intubation. Electronically Signed   By: Marijo Sanes M.D.   On: 07/29/2018 20:07    Assessment/Plan: Maurice Patterson is a 62 y.o. male admitted with confusion, weakness, possible PNA. Remains confused. Has had MRI x 2, CT of brain with nothing acute. Seen by neurology. EEG with diffuse slowing. Utox neg, Flu neg, HIV neg, RPR neg, TSH neg, UA negative, abg unimpressive. CSF with very mild elevated wbc and protein.  I do not think he has meningitis or encephalitis and certainly does not have a bacterial meningitis with such a low wbc. He follows with neurology as otpt for transient memory loss and expressive aphasia in past, hx of stroke and seizures.  I suspect current presentation is multifactorial.  12/7 high fever last night, diaphoretic, remains on O2 with increased resp distress today. Remains lethargic 12/8 transferred to unit for resp distress, cxr with bibasilar infiltrates, likely aspiration. wbc elevated.  12/9 temp to 100.9 but HD stable and stable on vent. HSV PCR neg Recommendations DC acyclovir since negativeHSV results - Cont zosyn for aspiration PNA  Remains off vanco since MRSA PCR negative making MRSA pna very unlikely - he has also had increasing cr Cont vent management per pulm  Cont neuro work up.   Thank you very much for the consult. Will  follow with you.  Maurice Patterson   07/31/2018, 12:44 PM

## 2018-07-31 NOTE — Progress Notes (Addendum)
Subjective: Patient remains intubated and sedated on fentanyl.  No significant events noted overnight  Objective: Current vital signs: BP 105/60   Pulse 98   Temp (!) 100.8 F (38.2 C)   Resp 15   Ht '5\' 6"'$  (1.676 m)   Wt 69.1 kg   SpO2 94%   BMI 24.59 kg/m  Vital signs in last 24 hours: Temp:  [98.7 F (37.1 C)-100.8 F (38.2 C)] 100.8 F (38.2 C) (12/09 0800) Pulse Rate:  [98-113] 98 (12/09 1200) Resp:  [13-20] 15 (12/09 1200) BP: (94-152)/(52-80) 105/60 (12/09 1200) SpO2:  [94 %-100 %] 94 % (12/09 1200) FiO2 (%):  [30 %] 30 % (12/09 1239)  Intake/Output from previous day: 12/08 0701 - 12/09 0700 In: 4109 [I.V.:2379.6; NG/GT:886.7; IV Piggyback:842.8] Out: 1425 [Urine:1425] Intake/Output this shift: Total I/O In: 576.1 [I.V.:490; IV Piggyback:86.1] Out: 200 [Urine:200] Nutritional status:  Diet Order            Diet NPO time specified  Diet effective now             Neurologic Exam: Mental Status: Patient does not respond to verbal stimuli.  Opens eyes to deep sternal rub.  Does not follow commands.  No verbalizations noted.  Cranial Nerves: II: Patient does not respond confrontation bilaterally, pupils right 2 mm, left 2 mm,and reactive  bilaterally III,IV,VI: doll's response positive  bilaterally.  V,VII: corneal reflex present bilaterally  VIII: patient does not respond to verbal stimuli IX,X: gag reflex present,  XI: trapezius strength unable to test bilaterally XII: tongue strength unable to test Motor: Does not move extremities to noxious stimuli except for LUE but does not break gravity. Sensory: Does not respond to noxious stimuli in any extremity except LUE Deep Tendon Reflexes:  2+ with absent ankle jerks bilaterally Plantars: Unable to assess Cerebellar: Unable to perform  Lab Results: Basic Metabolic Panel: Recent Labs  Lab 07/27/18 0505 07/28/18 0519 07/29/18 0324 07/30/18 0501 07/31/18 0528  NA 137 135 136 142 150*  K 3.6 3.8  3.9 4.8 4.3  CL 98 99 99 111 117*  CO2 '26 26 27 25 28  '$ GLUCOSE 173* 199* 211* 373* 127*  BUN 5* 6* 9 26* 28*  CREATININE 0.53* 0.57* 0.79 1.60* 1.59*  CALCIUM 8.2* 8.2* 8.4* 8.1* 8.2*  MG  --  1.9 1.9 2.1 2.3  PHOS  --  2.3* 2.0* 3.4 1.9*    Liver Function Tests: Recent Labs  Lab 07/28/18 0519 07/29/18 0324 07/30/18 0501  AST '21 19 17  '$ ALT '18 17 15  '$ ALKPHOS 149* 141* 117  BILITOT 0.9 0.7 0.4  PROT 6.7 6.5 5.8*  ALBUMIN 2.9* 2.9* 2.3*   No results for input(s): LIPASE, AMYLASE in the last 168 hours. Recent Labs  Lab 07/27/18 1413  AMMONIA 38*    CBC: Recent Labs  Lab 07/27/18 0505 07/28/18 0519 07/30/18 0501 07/31/18 0528  WBC 8.9 10.5 19.4* 18.6*  HGB 13.8 13.6 11.5* 10.7*  HCT 41.3 40.7 35.7* 33.9*  MCV 86.2 87.2 90.2 91.6  PLT 291 301 285 294    Cardiac Enzymes: No results for input(s): CKTOTAL, CKMB, CKMBINDEX, TROPONINI in the last 168 hours.  Lipid Panel: Recent Labs  Lab 07/30/18 0501  TRIG 69    CBG: Recent Labs  Lab 07/31/18 0747 07/31/18 0930 07/31/18 0956 07/31/18 1100 07/31/18 1209  GLUCAP 115* 193* 231* 222* 173*    Microbiology: Results for orders placed or performed during the hospital encounter of 07/22/18  Urine culture  Status: None   Collection Time: 07/22/18 11:03 PM  Result Value Ref Range Status   Specimen Description   Final    URINE, RANDOM Performed at [facility], [address], [place], [place] [address]    Special Requests   Final    NONE Performed at [facility], [address] [place], [place] [address]    Culture   Final    NO GROWTH Performed at [facility], [place] [address] [place], [place] [address]    Report Status 07/24/2018 FINAL  Final  CULTURE, BLOOD (ROUTINE X 2) w Reflex to ID Panel     Status: None   Collection Time: 07/23/18 10:06 AM  Result Value Ref Range Status   Specimen Description BLOOD BLOOD LEFT ARM  Final   Special Requests   Final    BOTTLES  DRAWN AEROBIC AND ANAEROBIC Blood Culture adequate volume   Culture   Final    NO GROWTH 5 DAYS Performed at [facility], [address] [place], [place] [address]    Report Status 07/28/2018 FINAL  Final  CULTURE, BLOOD (ROUTINE X 2) w Reflex to ID Panel     Status: None   Collection Time: 07/23/18 10:17 AM  Result Value Ref Range Status   Specimen Description BLOOD BLOOD LEFT WRIST  Final   Special Requests   Final    BOTTLES DRAWN AEROBIC AND ANAEROBIC Blood Culture adequate volume   Culture   Final    NO GROWTH 5 DAYS Performed at [facility], [address] [place], [place] [address]    Report Status 07/28/2018 FINAL  Final  Gram stain     Status: None   Collection Time: 07/26/18  3:11 PM  Result Value Ref Range Status   Specimen Description CSF  Final   Special Requests NONE  Final   Gram Stain   Final    NO ORGANISMS SEEN WBC SEEN Performed at [facility], [address] [place], [place] [address]    Report Status 07/26/2018 FINAL  Final  CSF culture     Status: None   Collection Time: 07/26/18  3:11 PM  Result Value Ref Range Status   Specimen Description   Final    CSF Performed at [facility], [address] [place], [place] [address]    Special Requests   Final    NONE Performed at [facility], [address] [place], [place] [address]    Gram Stain   Final    NO ORGANISMS SEEN WBC SEEN NO RBC SEEN Performed at [facility], [address] [place], [place] [address]    Culture   Final    NO GROWTH 3 DAYS Performed at [facility], [place] [address], [place], [place] [address]    Report Status 07/30/2018 FINAL  Final  Fungus Culture With Stain     Status: None (Preliminary result)   Collection Time: 07/26/18  3:11 PM  Result Value Ref Range Status   Fungus Stain Final report  Final    Comment: (NOTE) Performed At: [facility], [place]  [id number] Rush Farmer MD [id number]    Fungus (Mycology) Culture PENDING  Incomplete   Fungal Source CSF  Final    Comment: Performed at [facility], [address], [place], [place] [address]  Fungus Culture Result     Status: None   Collection Time: 07/26/18  3:11 PM  Result Value Ref Range Status   Result 1 Comment  Final    Comment: (NOTE) KOH/Calcofluor preparation:  no fungus observed. Performed At: [facility] [place], [place] [id number] Rush Farmer MD [id number]   CULTURE, BLOOD (ROUTINE X 2) w Reflex to ID Panel     Status: None (Preliminary result)   Collection Time: 07/29/18  6:14 AM  Result Value Ref Range Status   Specimen Description BLOOD LEFT FOREARM  Final   Special Requests   Final    BOTTLES DRAWN AEROBIC AND ANAEROBIC Blood Culture adequate volume   Culture   Final    NO GROWTH 2 DAYS Performed at [facility], [address], [place], [place] [address]    Report Status PENDING  Incomplete  CULTURE, BLOOD (ROUTINE X 2) w Reflex to ID Panel     Status: None (Preliminary result)   Collection Time: 07/29/18  6:17 AM  Result Value Ref Range Status   Specimen Description BLOOD LEFT HAND  Final   Special Requests   Final    BOTTLES DRAWN AEROBIC AND ANAEROBIC Blood Culture adequate volume   Culture   Final    NO GROWTH 2 DAYS Performed at [facility], [address] [place], [place] [address]    Report Status PENDING  Incomplete  MRSA PCR Screening     Status: None   Collection Time: 07/29/18  4:18 PM  Result Value Ref Range Status   MRSA by PCR NEGATIVE NEGATIVE Final    Comment:        The GeneXpert MRSA Assay (FDA approved for NASAL specimens only), is one component of a comprehensive MRSA colonization surveillance program. It is not intended to diagnose MRSA infection nor to guide or monitor treatment for MRSA infections. Performed at [facility], [address] [place], [place] [address]     Coagulation Studies: No results for input(s): LABPROT, INR in the last 72 hours.  Imaging: Dg Abd 1 View  Result Date: 07/29/2018 CLINICAL DATA:  NG tube placement EXAM: ABDOMEN - 1 VIEW COMPARISON:  None. FINDINGS: Enteric tube terminates in the distal gastric body. Nonobstructive bowel gas pattern. IMPRESSION: Enteric tube terminates in the distal gastric body. Electronically Signed   By: Julian Hy M.D.   On: 07/29/2018 20:10   Dg Chest Port 1 View  Result Date: 07/31/2018 CLINICAL DATA:  Acute respiratory failure EXAM: PORTABLE CHEST 1 VIEW COMPARISON:  07/30/2018 FINDINGS: Endotracheal tube and NG tube are unchanged. No confluent airspace opacities or effusions. Heart is normal size. No acute bony abnormality. IMPRESSION: No active disease. Electronically Signed   By: Rolm Baptise M.D.   On: 07/31/2018 07:45   Dg Chest Port 1 View  Result Date: 07/30/2018 CLINICAL DATA:  Acute respiratory failure. EXAM: PORTABLE CHEST 1 VIEW COMPARISON:  Chest x-ray 07/29/2018 FINDINGS: The cardiac silhouette, mediastinal and hilar contours are within normal limits and stable. The endotracheal tube is 2.1 cm above the carina. The NG tube is stable. Slightly low lung volumes with vascular crowding and streaky basilar atelectasis. No edema or pneumothorax. IMPRESSION: Support apparatus in good position, unchanged. Slightly lower lung volumes with streaky basilar atelectasis. Electronically Signed   By: Marijo Sanes M.D.   On: 07/30/2018 10:24   Dg Chest Port 1 View  Result Date: 07/29/2018 CLINICAL DATA:  Status post intubation. EXAM: PORTABLE CHEST 1 VIEW COMPARISON:  Earlier film, same date. FINDINGS: The endotracheal tube is 2.6 cm above the carina. The  NG tube is coursing down the esophagus and into the stomach. The heart is normal in size and stable. Stable coronary artery stents. The lungs show improved aeration post intubation with improved bibasilar aeration and  resolving bibasilar atelectasis. No pulmonary edema. IMPRESSION: The endotracheal tube and NG tubes are in good position. Improved basilar lung aeration post intubation. Electronically Signed   By: Marijo Sanes M.D.   On: 07/29/2018 20:07    Medications:  I have reviewed the patient's current medications. Prior to Admission:  Medications Prior to Admission  Medication Sig Dispense Refill Last Dose  . acetaminophen (TYLENOL) 500 MG tablet Take 500 mg by mouth 3 (three) times daily.   07/22/2018 at Unknown time  . aspirin EC 81 MG tablet Take 81 mg by mouth daily.   07/22/2018 at Unknown time  . botulinum toxin Type A (BOTOX) 100 units SOLR injection Inject 100 Units into the muscle every 3 (three) months.   unknown at unknown  . cholecalciferol (VITAMIN D) 1000 units tablet Take 1,000 Units by mouth daily.    07/22/2018 at Unknown time  . clopidogrel (PLAVIX) 75 MG tablet Take 75 mg by mouth daily.    07/22/2018 at Unknown time  . colestipol (COLESTID) 1 g tablet Take 1 g by mouth daily.    07/22/2018 at Unknown time  . diphenoxylate-atropine (LOMOTIL) 2.5-0.025 MG tablet Take 1 tablet by mouth 3 (three) times daily as needed for diarrhea or loose stools.   prn at prn  . donepezil (ARICEPT) 10 MG tablet Take 1 tablet (10 mg total) by mouth 2 (two) times daily. 180 tablet 3 07/22/2018 at am  . furosemide (LASIX) 40 MG tablet Take 40 mg by mouth daily.    07/22/2018 at Unknown time  . insulin NPH Human (HUMULIN N,NOVOLIN N) 100 UNIT/ML injection Inject 0.25 mLs (25 Units total) into the skin 2 (two) times daily. (Patient taking differently: Inject 32 Units into the skin 2 (two) times daily. ) 10 mL 11 07/22/2018 at am  . insulin regular (NOVOLIN R,HUMULIN R) 100 units/mL injection Inject 0.3 mLs (30 Units total) into the skin 3 (three) times daily with meals. (Patient taking differently: Inject 34 Units into the skin 3 (three) times daily before meals. 34 units subcutaneous 3 times daily with meals  with an additional sliding scale of 8 units added with every 20 glucose reading over 200.) 10 mL 1 07/22/2018 at Unknown time  . isosorbide mononitrate (IMDUR) 30 MG 24 hr tablet Take 30 mg by mouth daily.    07/22/2018 at Unknown time  . lamoTRIgine (LAMICTAL) 100 MG tablet Take 100 mg by mouth 2 (two) times daily.   07/22/2018 at am  . lidocaine (LIDODERM) 5 % Place 1 patch onto the skin daily as needed (pain). Remove & Discard patch within 12 hours or as directed by MD (APPLIED TO PATIENT BACK FOR PAIN)   Past Month at Unknown time  . losartan (COZAAR) 100 MG tablet Take 100 mg by mouth daily.   07/22/2018 at Unknown time  . memantine (NAMENDA) 10 MG tablet Take 1 tablet (10 mg total) by mouth 2 (two) times daily. 180 tablet 3 07/22/2018 at am  . metoprolol succinate (TOPROL-XL) 50 MG 24 hr tablet Take 50 mg by mouth daily. Take with or immediately following a meal.    07/22/2018 at Unknown time  . pantoprazole (PROTONIX) 40 MG tablet Take 40 mg by mouth daily.    07/22/2018 at Unknown time  . potassium chloride (  K-DUR) 10 MEQ tablet Take 10 mEq by mouth 2 (two) times daily.  2 07/22/2018 at am  . nitroGLYCERIN (NITROSTAT) 0.4 MG SL tablet Place 1 tablet under the tongue every 5 (five) minutes as needed for chest pain.    Taking   Scheduled: . aspirin  81 mg Oral Daily  . chlorhexidine gluconate (MEDLINE KIT)  15 mL Mouth Rinse BID  . donepezil  10 mg Oral BID  . enoxaparin (LOVENOX) injection  40 mg Subcutaneous Q24H  . ipratropium-albuterol  3 mL Nebulization Q4H  . lamoTRIgine  100 mg Oral BID  . lidocaine  1 patch Transdermal Q24H  . mouth rinse  15 mL Mouth Rinse 10 times per day  . memantine  10 mg Oral BID  . [START ON 08/01/2018] metoprolol tartrate  25 mg Per Tube BID  . pantoprazole (PROTONIX) IV  40 mg Intravenous QHS  . polyethylene glycol  17 g Per Tube Daily  . sennosides  5 mL Per Tube QHS   Patient seen and examined.  Clinical course and management discussed.  Necessary  edits performed.  I agree with the above.  Assessment and plan of care developed and discussed below.    Assessment: 62 y.o malepresenting with acute encephalopathy of unknown etiology in the setting of FUOand probable pneumonia. Initial concerns for CNS infection and started on empiric treatment for possible HSV encephalitis and or meningitis. However work up thus far includinginitial MRI brain on 07/23/2018 negative for acute intracranial process. Repeat MRI brain 12/7 also negative. X-ray showed suspected subtle infiltrate in the right upper lung and left base. Currently on abx. EEG  showed diffuse slowing otherwise no epileptiform discharges noted. He remains on Lamictal 200 mg BID. CSF studies showed slightly elevated WBC and protein, negative HSV. Labs: Utox neg, Flu neg, HIV neg, RPR neg, TSH neg, UA negative. Patient developed progressive respiratory failure with presumptive aspiration requiring intubation and transfer to the ICU. Altered mental status likely metabolic in etiology.  Return to baseline cognitive status may lag behind improvement in lab work/infection.  Recommendations 1. Agree with ID recommendations to d/c Acyclovir given pcr HSV negative 2. Continue Lamictal 3. Agree with current medical management and correction of metabolic derangements/treatment of infection.  This patient was staffed with Dr. Magda Paganini, Doy Mince who personally evaluated patient, reviewed documentation and agreed with assessment and plan of care as above.  [name], DNP, FNP-BC Board certified Nurse Practitioner Neurology Department    LOS: 8 days   07/31/2018  12:59 PM  Alexis Goodell, MD Neurology [phone]  07/31/2018  1:26 PM

## 2018-07-31 NOTE — Progress Notes (Signed)
*  PRELIMINARY RESULTS* Echocardiogram 2D Echocardiogram has been performed.  Maurice Patterson 07/31/2018, 10:11 AM

## 2018-07-31 NOTE — Progress Notes (Signed)
[place] [facility] [place] NAME: Maurice Patterson    MR#:  [id number]  DATE OF BIRTH:  09-11-1955  SUBJECTIVE:  CHIEF COMPLAINT:  No chief complaint on file. Patient came in with altered mental status and got intubated for possible aspiration pneumonia On Precedex drip  REVIEW OF SYSTEMS:  Review of Systems  Unable to perform ROS: Mental status change    DRUG ALLERGIES:   Allergies  Allergen Reactions  . Hydrocodone Anaphylaxis  . Morphine Other (See Comments)    Loss of memory  . Ambien [Zolpidem] Other (See Comments)    delirium    . Brilinta [Ticagrelor] Other (See Comments)    Stroke   . Flexeril [Cyclobenzaprine] Other (See Comments)    delerium   . Flunitrazepam Other (See Comments)    ROHYPNOL (hallucinations)  . Haldol [Haloperidol Lactate] Other (See Comments)    delerium   . Levetiracetam Diarrhea and Other (See Comments)    Unable to walk  . Lorazepam Hives  . Risperdal [Risperidone] Other (See Comments)    Delirium   . Trazodone Other (See Comments)    Delirium Can take in low doses   . Benadryl [Diphenhydramine Hcl (Sleep)] Rash  . Penicillins Rash    Mouth ulcers Has patient had a PCN reaction causing immediate rash, facial/tongue/throat swelling, SOB or lightheadedness with hypotension: Yes Has patient had a PCN reaction causing severe rash involving mucus membranes or skin necrosis: No Has patient had a PCN reaction that required hospitalization No Has patient had a PCN reaction occurring within the last 10 years: Yes If all of the above answers are "NO", then may proceed with Cephalosporin use.    VITALS:  Blood pressure (!) 104/57, pulse 95, temperature 99.7 F (37.6 C), temperature source Oral, resp. rate 16, height 5\' 6"  (1.676 m), weight 69.1 kg, SpO2 95 %.  PHYSICAL EXAMINATION:  Physical Exam  GENERAL:  62 y.o.-year-old patient lying in the bed with no acute distress.  EYES: Pupils equal,  round, reactive to light and accommodation. No scleral icterus. HEENT: Head atraumatic, normocephalic. Oropharynx and nasopharynx clear. Dry mucus membranes ET tube intact NECK:  Supple, no jugular venous distention. No thyroid enlargement, no tenderness.  LUNGS: Normal breath sounds bilaterally, increased rate, no wheezing, some crepitation.  CARDIOVASCULAR: S1, S2 normal.  Systolic murmur is present, no rubs, or gallops.  ABDOMEN: Soft, nontender, nondistended. Bowel sounds present. No organomegaly or mass.  EXTREMITIES: No pedal edema, cyanosis, or clubbing.  NEUROLOGIC: Sedated stiffer left upper extremity, opening eyes to tactile stimulation  PSYCHIATRIC:   Not following commands.  Withdrawing to touch. SKIN: No obvious rash, lesion, or ulcer.    LABORATORY PANEL:   CBC Recent Labs  Lab 07/31/18 0528  WBC 18.6*  HGB 10.7*  HCT 33.9*  PLT 294   ------------------------------------------------------------------------------------------------------------------  Chemistries  Recent Labs  Lab 07/30/18 0501 07/31/18 0528  NA 142 150*  K 4.8 4.3  CL 111 117*  CO2 25 28  GLUCOSE 373* 127*  BUN 26* 28*  CREATININE 1.60* 1.59*  CALCIUM 8.1* 8.2*  MG 2.1 2.3  AST 17  --   ALT 15  --   ALKPHOS 117  --   BILITOT 0.4  --    ------------------------------------------------------------------------------------------------------------------  Cardiac Enzymes No results for input(s): TROPONINI in the last 168 hours. ------------------------------------------------------------------------------------------------------------------  RADIOLOGY:  Dg Abd 1 View  Result Date: 07/29/2018 CLINICAL DATA:  NG tube placement EXAM: ABDOMEN - 1 VIEW COMPARISON:  None. FINDINGS: Enteric tube terminates in the distal gastric body. Nonobstructive bowel gas pattern. IMPRESSION: Enteric tube terminates in the distal gastric body. Electronically Signed   By: Julian Hy M.D.   On: 07/29/2018  20:10   Dg Chest Port 1 View  Result Date: 07/31/2018 CLINICAL DATA:  Acute respiratory failure EXAM: PORTABLE CHEST 1 VIEW COMPARISON:  07/30/2018 FINDINGS: Endotracheal tube and NG tube are unchanged. No confluent airspace opacities or effusions. Heart is normal size. No acute bony abnormality. IMPRESSION: No active disease. Electronically Signed   By: Rolm Baptise M.D.   On: 07/31/2018 07:45   Dg Chest Port 1 View  Result Date: 07/30/2018 CLINICAL DATA:  Acute respiratory failure. EXAM: PORTABLE CHEST 1 VIEW COMPARISON:  Chest x-ray 07/29/2018 FINDINGS: The cardiac silhouette, mediastinal and hilar contours are within normal limits and stable. The endotracheal tube is 2.1 cm above the carina. The NG tube is stable. Slightly low lung volumes with vascular crowding and streaky basilar atelectasis. No edema or pneumothorax. IMPRESSION: Support apparatus in good position, unchanged. Slightly lower lung volumes with streaky basilar atelectasis. Electronically Signed   By: Marijo Sanes M.D.   On: 07/30/2018 10:24   Dg Chest Port 1 View  Result Date: 07/29/2018 CLINICAL DATA:  Status post intubation. EXAM: PORTABLE CHEST 1 VIEW COMPARISON:  Earlier film, same date. FINDINGS: The endotracheal tube is 2.6 cm above the carina. The NG tube is coursing down the esophagus and into the stomach. The heart is normal in size and stable. Stable coronary artery stents. The lungs show improved aeration post intubation with improved bibasilar aeration and resolving bibasilar atelectasis. No pulmonary edema. IMPRESSION: The endotracheal tube and NG tubes are in good position. Improved basilar lung aeration post intubation. Electronically Signed   By: Marijo Sanes M.D.   On: 07/29/2018 20:07    EKG:   Orders placed or performed during the hospital encounter of 07/22/18  . EKG 12-Lead  . EKG 12-Lead  . EKG 12-Lead  . EKG 12-Lead  . ED EKG  . ED EKG    ASSESSMENT AND PLAN:   62 year old male with past  medical history significant for CAD, CHF, CKD, dementia, diabetes, hypertension and seizure disorder presents to hospital secondary to worsening weakness and poor appetite    -Acute metabolic encephalopathy with past medical history of HSV encephalitis CT head is negative LP done and no evidence of bacterial meningitis with normal WBC and elevated glucose and protein HSV PCR test is negative and acyclovir discontinued per ID Appreciate ID and neurology recommendations Gram stain is negative.  Cultures on CSF is negative. Initial EEG without seizure activity but generalized slowing.  Repeat EEG - no seizure like activities.   -Aspiration pneumonia- ac respi failure with hypoxia  07/29/18-had fever and worsening on chest x-ray. Started on broad-spectrum antibiotics Zosyn   repeat blood cultures negative MRSA PCR negative Pt is intubated  And on ventilator support. On Levophed drip to use as needed   --  Hypokalemia- replaced.  -  Diabetes mellitus on insulin drip.  5.  History of seizure disorder/myoclonic jerks-on Lamictal. -According to wife, patient does not have any history of seizures -EEG with generalized slowing, no seizure-like activity.  Empirically Vimpat was added by neurology  6.  DVT prophylaxis-on Lovenox   7. Nutrition- Failed attempt for PICC line initially.  Now on vent, Feeding started. May need to have arrangements for long term feeding.    All the records are reviewed and case discussed with  Care Management/Social Workerr. Management plans discussed with the patient, family and they are in agreement.  CODE STATUS: Full Code  TOTAL TIME TAKING CARE OF THIS PATIENT: 35 minutes.   POSSIBLE D/C IN 2 DAYS, DEPENDING ON CLINICAL CONDITION.   [name] M.D on 07/31/2018 at 2:11 PM  Between 7am to 6pm - Pager - [phone]  After 6pm go to [email or web address] - password [email or web address] [facility]  Office  [phone]  CC: Primary care  physician; [name], MD

## 2018-07-31 NOTE — Progress Notes (Addendum)
Inpatient Diabetes Program Recommendations  AACE/ADA: New Consensus Statement on Inpatient Glycemic Control (2015)  Target Ranges:  Prepandial:   less than 140 mg/dL      Peak postprandial:   less than 180 mg/dL (1-2 hours)      Critically ill patients:  140 - 180 mg/dL   Results for Maurice Patterson, Maurice Patterson (MRN [id number]) as of 07/31/2018 08:07  Ref. Range 07/31/2018 00:35 07/31/2018 01:39 07/31/2018 02:43 07/31/2018 03:38 07/31/2018 04:43 07/31/2018 05:42 07/31/2018 06:52  Glucose-Capillary Latest Ref Range: 70 - 99 mg/dL 150 (H)  6.3 units/hr 168 (H)  7.6 units/hr 174 (H)  8 units/hr 180 (H)  8.4 units/hr 129 (H)  3.9 units/hr 122 (H)  2.8 units/hr 109 (H)  1.7 units/hr     Home DM Meds: NPH Insulin 32 units BID            Regular Insulin 34 units TID with meals       Regular Insulin 8 units for every 20 mg/dl >200 mg/dl  Current Orders: IV Insulin Drip      Patient may be ready to transition off the IV Insulin Drip this AM back to SQ basal/bolus regimen.  CBGs are consistently <180 mg/dl  Tube feeds are running at goal rate 60cc/hr  IV Insulin drip rates are less than 4 units/hr as of 4:43am today.  Also getting Solumedrol 60 mg Daily.    MD- If you feel patient is ready to transition to SQ Insulin today, please consider custom orders for patient as he takes a fairly large amount of Insulin at home.  Recommend the following for transition:  1. Levemir 25 units BID (80% total home dose basal insulin--Takes NPH 32 units BID at home)  2. Novolog Resistant SSI (3-6-9 units) Q4 hours per the ICU Glycemic Control Protocol order set  3. Novolog Tube Feed Coverage: Novolog 4 units Q4 hours   (HOLD if tube feeds HELD for any reason)      --Will follow patient during hospitalization--  Wyn Quaker RN, MSN, CDE Diabetes Coordinator Inpatient Glycemic Control Team Team Pager: [phone] (8a-5p)

## 2018-07-31 NOTE — Progress Notes (Signed)
   07/31/18 1100  Clinical Encounter Type  Visited With Patient and family together  Visit Type Follow-up  Recommendations Follow-up as requested.  Spiritual Encounters  Spiritual Needs Emotional;Prayer   Chaplain offered continued pastoral support of the patient and wife. As the patient improves as does the attitude/spirits of Maurice Patterson. Chaplain provided energetic prayer, active listening and empathic responses.

## 2018-07-31 NOTE — Progress Notes (Signed)
CRITICAL CARE NOTE  CC  follow up respiratory failure/ventilator management  SUBJECTIVE Patient remains critically ill, remains on the ventilator.  Has not tolerated spontaneous breathing trials. Maurice Patterson is an 62 y.o. male  with an underlying past medical history of congestive heart failure, coronary artery disease, chronic renal failure, diabetes, hypertension, hyperlipidemia, gastroesophageal reflux disease, prior history of pancreatic cancer was brought in by EMS with complaints of weakness and progressive altered mental status. She was admitted to the hospital with a diagnosis of community-acquired pneumonia andencephalopathy. Felt to be metabolic in nature, neurology was consulted, negative work-up to include lumbar puncture and brain MRI.Patient developed progressive respiratory failure with presumptive aspiration. He was brought to the intensive care unit and started on BiPAP.  He unfortunately failed BiPAP trial and had to be intubated.  He has not tolerated spontaneous breathing trials.   SIGNIFICANT EVENTS Lines, Airways, Drains:     Airway 8 mm (Active)  Secured at (cm) 25 cm 07/30/2018  2:43 AM  Measured From Lips 07/30/2018  2:43 AM  Secured Location Right 07/30/2018  2:43 AM  Secured By [employer] 07/30/2018  2:43 AM  Tube Holder Repositioned Yes 07/30/2018  2:43 AM  Cuff Pressure (cm H2O) 26 cm H2O 07/29/2018  9:50 PM  Site Condition Dry 07/30/2018  2:43 AM     NG/OG Tube Orogastric 16 Fr. Left mouth Xray Documented cm marking at nare/ corner of mouth 66 cm (Active)  Cm Marking at Nare/Corner of Mouth (if applicable) 66 cm 60/02/3709  8:00 PM  Site Assessment Clean;Dry;Intact 07/29/2018  8:00 PM  Ongoing Placement Verification Other (Comment) 07/29/2018  8:00 PM  Status Clamped 07/29/2018  8:00 PM     Urethral Catheter Maurice Patterson Double-lumen;Latex 16 Fr. (Active)  Indication for Insertion or Continuance of Catheter Unstable critical patients (first 24-48  hours) 07/29/2018  8:00 PM  Site Assessment Clean;Intact 07/29/2018  8:00 PM  Catheter Maintenance Bag below level of bladder;Drainage bag/tubing not touching floor 07/29/2018  8:00 PM  Collection Container Standard drainage bag 07/29/2018  8:00 PM  Securement Method Securing device (Describe) 07/29/2018  8:00 PM  Urinary Catheter Interventions Unclamped 07/29/2018  8:00 PM  Output (mL) 155 mL 07/29/2018 10:25 PM     Ureteral Drain/Stent Left ureter 6 Fr. (Active)    Medication Administration Record has been reviewed in detail.  Disciplinary rounds have been performed with Pharm.D., RT, RD, case management, and bedside nurse.  Review of Systems: Could not be obtained due to the patient being sedated and on mechanical ventilation. BP (!) 104/57   Pulse 95   Temp 99.7 F (37.6 C) (Oral)   Resp 16   Ht 5\' 6"  (1.676 m)   Wt 69.1 kg   SpO2 95%   BMI 24.59 kg/m He is synchronous with the ventilator.  PHYSICAL EXAMINATION: Vital signs, I's and O's, nursing notes, reviewed. GENERAL:critically ill appearing, intubated, mechanically ventilated. HEAD: Normocephalic, atraumatic.  EYES: Pupils equal, round, reactive to light.  No scleral icterus.  MOUTH: Moist mucosal membranes. NECK: Supple. No thyromegaly. No nodules. No JVD.  PULMONARY: Coarse breath sounds throughout.  No wheezes or rhonchi noted. CARDIOVASCULAR: S1 and S2. Regular rate and rhythm.  There is a loud grade 3/6 crecendo/decrescendo systolic ejection murmur at the right upper sternal border radiation to carotids.  Suspect AS. GASTROINTESTINAL: Soft, nontender, -distended. No masses. Positive bowel sounds. No hepatosplenomegaly.  OG tube in place  MUSCULOSKELETAL: No swelling, clubbing, or edema.  NEUROLOGIC: Sedated, will open eyes,  not following one-step commands consistently. SKIN:intact,warm,dry      Indwelling Urinary Catheter continued, requirement due to   Reason to continue Indwelling Urinary Catheter for strict  Intake/Output monitoring for hemodynamic instability   Central Line no central line   Reason to continue Central Line N/A   Ventilator continued, requirement due to, resp failure, failure of SBT    Ventilator Sedation RASS 0 to -2     ASSESSMENT AND PLAN SYNOPSIS   1.  Ventilator dependent respiratory failure/hypoxemic. Most likely aspiration in the setting encephalopathy.    He is presently tolerating mechanical ventilation however not tolerating SBT.  He continues to be quite obtunded.  We have adjusted sedatives. Empiric antibiotics with vancomycin, meropenem and acyclovir.  2.  Toxic metabolic encephalopathy with negative imaging studies and lumbar puncture being followed by neurology  3.  Hyperglycemia.  On scale coverage, discontinue steroids as not indicated.  4.  Hypophosphatemia.  Replaced  5.  History of seizure disorder.  Patient is on Lamictal  6.  Leukocytosis.  Most likely secondary to pneumonia on empiric broad-spectrum coverage steroids discontinued which may have been aggravating this issue.  7.  Acute renal failure renal insufficiency: BUN is 28/creatinine 1.6, potassium stable at 4.3 and CO2 is 28.  Be due to ATN.  8.  Cardiac murmur consistent with aortic stenosis, this issue adds complexity to his management and may be hindering later wean.  2D echo pending.   DVT/GI prophylaxis in place MONITOR FSBS ASSESS the need for LABS as needed   Critical Care Time devoted to patient care services described in this note is 45 minutes Overall, patient is critically ill, prognosis is guarded.  Patient with multiorgan failure and at high risk for cardiac arrest and death.  Multidisciplinary rounds were performed as noted above.   Renold Don, MD [facility] Pulmonary/Critical Care Medicine and Advanced Bronchoscopy

## 2018-08-01 LAB — BASIC METABOLIC PANEL
Anion gap: 5 (ref 5–15)
BUN: 32 mg/dL — ABNORMAL HIGH (ref 8–23)
CO2: 25 mmol/L (ref 22–32)
Calcium: 7.9 mg/dL — ABNORMAL LOW (ref 8.9–10.3)
Chloride: 120 mmol/L — ABNORMAL HIGH (ref 98–111)
Creatinine, Ser: 1.39 mg/dL — ABNORMAL HIGH (ref 0.61–1.24)
GFR calc Af Amer: >60 mL/min (ref >60)
GFR calc non Af Amer: 54 mL/min — ABNORMAL LOW (ref >60)
Glucose, Bld: 191 mg/dL — ABNORMAL HIGH (ref 70–99)
Potassium: 4.3 mmol/L (ref 3.5–5.1)
SODIUM: 150 mmol/L — AB (ref 135–145)

## 2018-08-01 LAB — CBC WITH DIFFERENTIAL/PLATELET
Abs Immature Granulocytes: 0.1 10*3/uL — ABNORMAL HIGH (ref 0.00–0.07)
Basophils Absolute: 0 10*3/uL (ref 0.0–0.1)
Basophils Relative: 0 %
Eosinophils Absolute: 0 10*3/uL (ref 0.0–0.5)
Eosinophils Relative: 0 %
HEMATOCRIT: 33 % — AB (ref 39.0–52.0)
Hemoglobin: 10.3 g/dL — ABNORMAL LOW (ref 13.0–17.0)
Immature Granulocytes: 1 %
Lymphocytes Relative: 15 %
Lymphs Abs: 1.5 10*3/uL (ref 0.7–4.0)
MCH: 29.3 pg (ref 26.0–34.0)
MCHC: 31.2 g/dL (ref 30.0–36.0)
MCV: 93.8 fL (ref 80.0–100.0)
MONO ABS: 1 10*3/uL (ref 0.1–1.0)
MONOS PCT: 9 %
Neutro Abs: 7.7 10*3/uL (ref 1.7–7.7)
Neutrophils Relative %: 75 %
Platelets: 260 10*3/uL (ref 150–400)
RBC: 3.52 MIL/uL — ABNORMAL LOW (ref 4.22–5.81)
RDW: 15.8 % — ABNORMAL HIGH (ref 11.5–15.5)
WBC: 10.3 10*3/uL (ref 4.0–10.5)
nRBC: 0 % (ref 0.0–0.2)

## 2018-08-01 LAB — GLUCOSE, CAPILLARY
GLUCOSE-CAPILLARY: 154 mg/dL — AB (ref 70–99)
GLUCOSE-CAPILLARY: 135 mg/dL — AB (ref 70–99)
Glucose-Capillary: 138 mg/dL — ABNORMAL HIGH (ref 70–99)
Glucose-Capillary: 150 mg/dL — ABNORMAL HIGH (ref 70–99)
Glucose-Capillary: 155 mg/dL — ABNORMAL HIGH (ref 70–99)
Glucose-Capillary: 165 mg/dL — ABNORMAL HIGH (ref 70–99)
Glucose-Capillary: 165 mg/dL — ABNORMAL HIGH (ref 70–99)
Glucose-Capillary: 166 mg/dL — ABNORMAL HIGH (ref 70–99)
Glucose-Capillary: 175 mg/dL — ABNORMAL HIGH (ref 70–99)
Glucose-Capillary: 177 mg/dL — ABNORMAL HIGH (ref 70–99)
Glucose-Capillary: 178 mg/dL — ABNORMAL HIGH (ref 70–99)
Glucose-Capillary: 179 mg/dL — ABNORMAL HIGH (ref 70–99)
Glucose-Capillary: 181 mg/dL — ABNORMAL HIGH (ref 70–99)
Glucose-Capillary: 199 mg/dL — ABNORMAL HIGH (ref 70–99)
Glucose-Capillary: 240 mg/dL — ABNORMAL HIGH (ref 70–99)
Glucose-Capillary: 311 mg/dL — ABNORMAL HIGH (ref 70–99)

## 2018-08-01 LAB — PHOSPHORUS: PHOSPHORUS: 2.3 mg/dL — AB (ref 2.5–4.6)

## 2018-08-01 LAB — HSV DNA BY PCR (REFERENCE LAB)
HSV 1 DNA: NEGATIVE
HSV 2 DNA: NEGATIVE

## 2018-08-01 LAB — FUNGITELL, SERUM: Fungitell Result: <31 pg/mL (ref <80)

## 2018-08-01 MED ORDER — DEXTROSE 5 % IV SOLN
INTRAVENOUS | Status: DC
  Administered 2018-08-01 (×2): via INTRAVENOUS

## 2018-08-01 MED ORDER — SODIUM CHLORIDE 0.9 % IV SOLN
2.0000 g | Freq: Two times a day (BID) | INTRAVENOUS | Status: AC
  Administered 2018-08-01 – 2018-08-04 (×8): 2 g via INTRAVENOUS
  Filled 2018-08-01 (×9): qty 2

## 2018-08-01 MED ORDER — INSULIN DETEMIR 100 UNIT/ML ~~LOC~~ SOLN
25.0000 [IU] | Freq: Two times a day (BID) | SUBCUTANEOUS | Status: DC
  Administered 2018-08-01 (×2): 25 [IU] via SUBCUTANEOUS
  Filled 2018-08-01 (×5): qty 0.25

## 2018-08-01 MED ORDER — METRONIDAZOLE 500 MG PO TABS
500.0000 mg | ORAL_TABLET | Freq: Three times a day (TID) | ORAL | Status: DC
  Administered 2018-08-01 – 2018-08-02 (×3): 500 mg
  Filled 2018-08-01 (×5): qty 1

## 2018-08-01 MED ORDER — FREE WATER
200.0000 mL | Status: DC
  Administered 2018-08-01 – 2018-08-02 (×6): 200 mL

## 2018-08-01 MED ORDER — METRONIDAZOLE 50 MG/ML ORAL SUSPENSION
500.0000 mg | Freq: Three times a day (TID) | ORAL | Status: DC
  Filled 2018-08-01 (×3): qty 10

## 2018-08-01 MED ORDER — INSULIN ASPART 100 UNIT/ML ~~LOC~~ SOLN
0.0000 [IU] | SUBCUTANEOUS | Status: DC
  Administered 2018-08-01: 4 [IU] via SUBCUTANEOUS
  Administered 2018-08-01: 15 [IU] via SUBCUTANEOUS
  Administered 2018-08-01: 7 [IU] via SUBCUTANEOUS
  Administered 2018-08-02: 11 [IU] via SUBCUTANEOUS
  Filled 2018-08-01 (×4): qty 1

## 2018-08-01 NOTE — Progress Notes (Signed)
Called and spoke with patient's wife and made her aware that patient is restless and uncomfortable, gagging and coughing against ET tube, tearing from the eyes and blood pressure is elevated 218/111 and that sedation needs to be started back.  Wife verbalized understanding and stated it's okay to go ahead and start it.  Wife was concerned this morning about him being too sleepy and did not want  Sedation turned back on if he did not need it and was very apprehensive earlier prior to speaking with Dr. Duwayne Heck.

## 2018-08-01 NOTE — Progress Notes (Signed)
Patient very restless and agitated, respirations increased to 30. Patient is gagging and coughing against vent, lood pressure elevated to 204/97. Fentanyl restarted at 25 mcg. Will continue to monitor and assess need for Fentanyl and Precedex.   2328 Patient is now resting comfortably, blood pressure and respirations are now decreased and patient is tolerating vent.

## 2018-08-01 NOTE — Progress Notes (Signed)
[facility] [place] NAME: Maurice Patterson    MR#:  [id number]  DATE OF BIRTH:  11-Jun-1956  SUBJECTIVE:  CHIEF COMPLAINT:  No chief complaint on file. Patient came in with altered mental status and got intubated for possible aspiration pneumonia Off Precedex drip,SBT today Son at bed side  REVIEW OF SYSTEMS:  Review of Systems  Unable to perform ROS: Mental status change    DRUG ALLERGIES:   Allergies  Allergen Reactions  . Hydrocodone Anaphylaxis  . Morphine Other (See Comments)    Loss of memory  . Ambien [Zolpidem] Other (See Comments)    delirium    . Brilinta [Ticagrelor] Other (See Comments)    Stroke   . Flexeril [Cyclobenzaprine] Other (See Comments)    delerium   . Flunitrazepam Other (See Comments)    ROHYPNOL (hallucinations)  . Haldol [Haloperidol Lactate] Other (See Comments)    delerium   . Levetiracetam Diarrhea and Other (See Comments)    Unable to walk  . Lorazepam Hives  . Risperdal [Risperidone] Other (See Comments)    Delirium   . Trazodone Other (See Comments)    Delirium Can take in low doses   . Benadryl [Diphenhydramine Hcl (Sleep)] Rash  . Penicillins Rash    Mouth ulcers Has patient had a PCN reaction causing immediate rash, facial/tongue/throat swelling, SOB or lightheadedness with hypotension: Yes Has patient had a PCN reaction causing severe rash involving mucus membranes or skin necrosis: No Has patient had a PCN reaction that required hospitalization No Has patient had a PCN reaction occurring within the last 10 years: Yes If all of the above answers are "NO", then may proceed with Cephalosporin use.    VITALS:  Blood pressure 137/69, pulse 93, temperature 98.8 F (37.1 C), temperature source Oral, resp. rate 17, height 5\' 6"  (1.676 m), weight 69.1 kg, SpO2 98 %.  PHYSICAL EXAMINATION:  Physical Exam  GENERAL:  62 y.o.-year-old patient lying in the bed with no acute distress.    EYES: Pupils equal, round, reactive to light and accommodation. No scleral icterus. HEENT: Head atraumatic, normocephalic. Oropharynx and nasopharynx clear. Dry mucus membranes ET tube intact NECK:  Supple, no jugular venous distention. No thyroid enlargement, no tenderness.  LUNGS: Normal breath sounds bilaterally, increased rate, no wheezing, some crepitation.  CARDIOVASCULAR: S1, S2 normal.  Systolic murmur is present, no rubs, or gallops.  ABDOMEN: Soft, nontender, nondistended. Bowel sounds present. No organomegaly or mass.  EXTREMITIES: No pedal edema, cyanosis, or clubbing.  NEUROLOGIC: Sedated stiffer left upper extremity, opening eyes to tactile stimulation  PSYCHIATRIC:   Not following commands.  Withdrawing to touch. SKIN: No obvious rash, lesion, or ulcer.    LABORATORY PANEL:   CBC Recent Labs  Lab 08/01/18 0514  WBC 10.3  HGB 10.3*  HCT 33.0*  PLT 260   ------------------------------------------------------------------------------------------------------------------  Chemistries  Recent Labs  Lab 07/30/18 0501 07/31/18 0528 08/01/18 0514  NA 142 150* 150*  K 4.8 4.3 4.3  CL 111 117* 120*  CO2 25 28 25   GLUCOSE 373* 127* 191*  BUN 26* 28* 32*  CREATININE 1.60* 1.59* 1.39*  CALCIUM 8.1* 8.2* 7.9*  MG 2.1 2.3  --   AST 17  --   --   ALT 15  --   --   ALKPHOS 117  --   --   BILITOT 0.4  --   --    ------------------------------------------------------------------------------------------------------------------  Cardiac Enzymes No results for input(s):  TROPONINI in the last 168 hours. ------------------------------------------------------------------------------------------------------------------  RADIOLOGY:  Dg Chest Port 1 View  Result Date: 07/31/2018 CLINICAL DATA:  Acute respiratory failure EXAM: PORTABLE CHEST 1 VIEW COMPARISON:  07/30/2018 FINDINGS: Endotracheal tube and NG tube are unchanged. No confluent airspace opacities or effusions. Heart  is normal size. No acute bony abnormality. IMPRESSION: No active disease. Electronically Signed   By: Rolm Baptise M.D.   On: 07/31/2018 07:45    EKG:   Orders placed or performed during the hospital encounter of 07/22/18  . EKG 12-Lead  . EKG 12-Lead  . EKG 12-Lead  . EKG 12-Lead  . ED EKG  . ED EKG    ASSESSMENT AND PLAN:   63 year old male with past medical history significant for CAD, CHF, CKD, dementia, diabetes, hypertension and seizure disorder presents to hospital secondary to worsening weakness and poor appetite    -Acute metabolic encephalopathy with past medical history of HSV encephalitis CT head is negative LP done and no evidence of bacterial meningitis with normal WBC and elevated glucose and protein HSV PCR test is negative and acyclovir discontinued per ID Appreciate ID and neurology recommendations Gram stain is negative.  Cultures on CSF is negative. Initial EEG without seizure activity but generalized slowing.  Repeat EEG - no seizure like activities.   -Aspiration pneumonia- ac respi failure with hypoxia  07/29/18-had fever and worsening on chest x-ray. Started on broad-spectrum antibiotics Zosyn for aspiration pneumonia 07/31/2018-100.9 08/01/2018-temperature 102.5 but leukocytosis is trending down ID has recommended to discontinue Zosyn and started patient on cefepime and Flagyl.  Check sputum culture  repeat blood cultures negative MRSA PCR negative.  Vanga discontinued Pt is intubated  And on ventilator support. On Levophed drip to use as needed   --  Hypokalemia- replaced.  -  Diabetes mellitus on insulin drip.  -  History of seizure disorder/myoclonic jerks-on Lamictal. -According to wife, patient does not have any history of seizures -EEG with generalized slowing, no seizure-like activity.  Empirically Vimpat was added by neurology  -  DVT prophylaxis-on Lovenox   -. Nutrition- Failed attempt for PICC line initially.  Now on vent, Feeding  started. May need to have arrangements for long term feeding.    All the records are reviewed and case discussed with Care Management/Social Workerr. Management plans discussed with the patient, family and they are in agreement.  CODE STATUS: Full Code  TOTAL TIME TAKING CARE OF THIS PATIENT: 35 minutes.   POSSIBLE D/C IN 2 DAYS, DEPENDING ON CLINICAL CONDITION.   [name] M.D on 08/01/2018 at 3:13 PM  Between 7am to 6pm - Pager - [phone]  After 6pm go to [email or web address] - password [email or web address] [place] [facility]  Office  [phone]  CC: Primary care physician; [name], MD

## 2018-08-01 NOTE — Progress Notes (Signed)
[place] INFECTIOUS DISEASE PROGRESS NOTE Date of Admission:  07/22/2018     ID: Maurice Patterson is a 62 y.o. male with pneumonia and AMS  Principal Problem:   CAP (community acquired pneumonia) Active Problems:   Uncontrolled type 2 diabetes mellitus with hyperglycemia, with long-term current use of insulin (HCC)   Essential hypertension   Alzheimer's dementia ([place])   GERD (gastroesophageal reflux disease)   Fever   Acute respiratory failure ([place])   Subjective: Remains intubated and had persistent fever yesterday.  But improving on vent  ROS  Unable to obtain  Medications:  Antibiotics Given (last 72 hours)    Date/Time Action Medication Dose Rate   07/29/18 1452 New Bag/Given   meropenem (MERREM) 1 g in sodium chloride 0.9 % 100 mL IVPB 1 g 200 mL/hr   07/29/18 1912 New Bag/Given   vancomycin (VANCOCIN) 1,500 mg in sodium chloride 0.9 % 500 mL IVPB 1,500 mg 250 mL/hr   07/29/18 2239 New Bag/Given   meropenem (MERREM) 1 g in sodium chloride 0.9 % 100 mL IVPB 1 g 200 mL/hr   07/30/18 0004 New Bag/Given   acyclovir (ZOVIRAX) 700 mg in dextrose 5 % 100 mL IVPB 700 mg 114 mL/hr   07/30/18 0514 New Bag/Given   meropenem (MERREM) 1 g in sodium chloride 0.9 % 100 mL IVPB 1 g 200 mL/hr   07/30/18 0732 New Bag/Given   acyclovir (ZOVIRAX) 700 mg in dextrose 5 % 100 mL IVPB 700 mg 114 mL/hr   07/30/18 1418 New Bag/Given   piperacillin-tazobactam (ZOSYN) IVPB 3.375 g 3.375 g 12.5 mL/hr   07/30/18 2334 New Bag/Given   piperacillin-tazobactam (ZOSYN) IVPB 3.375 g 3.375 g 12.5 mL/hr   07/31/18 2595 New Bag/Given   piperacillin-tazobactam (ZOSYN) IVPB 3.375 g 3.375 g 12.5 mL/hr   07/31/18 1532 New Bag/Given   piperacillin-tazobactam (ZOSYN) IVPB 3.375 g 3.375 g 12.5 mL/hr   07/31/18 2229 New Bag/Given   piperacillin-tazobactam (ZOSYN) IVPB 3.375 g 3.375 g 12.5 mL/hr   08/01/18 0517 New Bag/Given   piperacillin-tazobactam (ZOSYN) IVPB 3.375 g 3.375 g 12.5 mL/hr     . aspirin   81 mg Oral Daily  . chlorhexidine gluconate (MEDLINE KIT)  15 mL Mouth Rinse BID  . donepezil  10 mg Oral BID  . enoxaparin (LOVENOX) injection  40 mg Subcutaneous Q24H  . free water  200 mL Per Tube Q4H  . insulin aspart  0-20 Units Subcutaneous Q4H  . insulin detemir  25 Units Subcutaneous BID  . ipratropium-albuterol  3 mL Nebulization Q4H  . lamoTRIgine  100 mg Oral BID  . lidocaine  1 patch Transdermal Q24H  . mouth rinse  15 mL Mouth Rinse 10 times per day  . memantine  10 mg Oral BID  . metoprolol tartrate  25 mg Per Tube BID  . pantoprazole (PROTONIX) IV  40 mg Intravenous QHS  . polyethylene glycol  17 g Per Tube Daily  . sennosides  5 mL Per Tube QHS    Objective: Vital signs in last 24 hours: Temp:  [100.2 F (37.9 C)-102.5 F (39.2 C)] 100.2 F (37.9 C) (12/10 1033) Pulse Rate:  [86-101] 91 (12/10 1100) Resp:  [12-19] 17 (12/10 1100) BP: (100-168)/(57-79) 124/70 (12/10 1100) SpO2:  [95 %-99 %] 98 % (12/10 1100) FiO2 (%):  [30 %] 30 % (12/10 1120) Physical Exam  Constitutional: intubated, sedated HENT: aniceric, t reactive Mouth/Throat: ETT in place Cardiovascular: Normal rate, regular rhythm and normal heart sounds.  Pulmonary/Chest:  bil rhonchi and poor air movement Abdominal: Soft. Bowel sounds are normal. He exhibits no distension. There is no tenderness.  Lymphadenopathy: He has no cervical adenopathy.  Neurological: sedated  R groin TLC Skin: Skin is warm and dry. No rash noted. No erythema.  Psychiatric: min responsive Ext trace edema bil  Lab Results Recent Labs    07/31/18 0528 08/01/18 0514  WBC 18.6* 10.3  HGB 10.7* 10.3*  HCT 33.9* 33.0*  NA 150* 150*  K 4.3 4.3  CL 117* 120*  CO2 28 25  BUN 28* 32*  CREATININE 1.59* 1.39*    Microbiology: Results for orders placed or performed during the hospital encounter of 07/22/18  Urine culture     Status: None   Collection Time: 07/22/18 11:03 PM  Result Value Ref Range Status   Specimen  Description   Final    URINE, RANDOM Performed at [facility], [address] [place], [place] [address]    Special Requests   Final    NONE Performed at [facility], [address], [place], [place] [address]    Culture   Final    NO GROWTH Performed at [facility], [place] [address] [place], [place] [address]    Report Status 07/24/2018 FINAL  Final  CULTURE, BLOOD (ROUTINE X 2) w Reflex to ID Panel     Status: None   Collection Time: 07/23/18 10:06 AM  Result Value Ref Range Status   Specimen Description BLOOD BLOOD LEFT ARM  Final   Special Requests   Final    BOTTLES DRAWN AEROBIC AND ANAEROBIC Blood Culture adequate volume   Culture   Final    NO GROWTH 5 DAYS Performed at [facility], [address] [place], [place] [address]    Report Status 07/28/2018 FINAL  Final  CULTURE, BLOOD (ROUTINE X 2) w Reflex to ID Panel     Status: None   Collection Time: 07/23/18 10:17 AM  Result Value Ref Range Status   Specimen Description BLOOD BLOOD LEFT WRIST  Final   Special Requests   Final    BOTTLES DRAWN AEROBIC AND ANAEROBIC Blood Culture adequate volume   Culture   Final    NO GROWTH 5 DAYS Performed at [facility], [address], [place], [place] [address]    Report Status 07/28/2018 FINAL  Final  Gram stain     Status: None   Collection Time: 07/26/18  3:11 PM  Result Value Ref Range Status   Specimen Description CSF  Final   Special Requests NONE  Final   Gram Stain   Final    NO ORGANISMS SEEN WBC SEEN Performed at [facility], [address], [place], [place] [address]    Report Status 07/26/2018 FINAL  Final  CSF culture     Status: None   Collection Time: 07/26/18  3:11 PM  Result Value Ref Range Status   Specimen Description   Final    CSF Performed at [facility], [address], [place], [place] [address]    Special Requests   Final    NONE Performed at [facility], [address] [place], [place] [address]    Gram Stain   Final    NO ORGANISMS SEEN WBC SEEN NO RBC SEEN Performed at [facility], [address] [place], [place] [address]    Culture   Final    NO GROWTH 3 DAYS Performed at [facility], [place] [address] [place], [place] [address]    Report Status 07/30/2018 FINAL  Final  Fungus Culture With Stain     Status: None (Preliminary result)   Collection Time: 07/26/18  3:11 PM  Result Value Ref Range Status   Fungus Stain Final report  Final    Comment: (NOTE) Performed At: [facility] [place], [place] [id number] Rush Farmer MD PJ:[id number]    Fungus (Mycology) Culture PENDING  Incomplete   Fungal Source CSF  Final    Comment: Performed at [facility], [address] [place], [place] [address]  Fungus Culture Result     Status: None   Collection Time: 07/26/18  3:11 PM  Result Value Ref Range Status   Result 1 Comment  Final    Comment: (NOTE) KOH/Calcofluor preparation:  no fungus observed. Performed At: [facility] [place], [place] [id number] Rush Farmer MD ZH:[id number]   CULTURE, BLOOD (ROUTINE X 2) w Reflex to ID Panel     Status: None (Preliminary result)   Collection Time: 07/29/18  6:14 AM  Result Value Ref Range Status   Specimen Description BLOOD LEFT FOREARM  Final   Special Requests   Final    BOTTLES DRAWN AEROBIC AND ANAEROBIC Blood Culture adequate volume   Culture   Final    NO GROWTH 3 DAYS Performed at [facility], [address] [place], [place] [address]    Report Status PENDING  Incomplete  CULTURE, BLOOD (ROUTINE X 2) w Reflex to ID Panel     Status: None (Preliminary result)   Collection Time: 07/29/18  6:17 AM  Result Value Ref Range Status   Specimen Description BLOOD LEFT HAND  Final   Special Requests   Final    BOTTLES DRAWN AEROBIC AND ANAEROBIC Blood Culture adequate volume   Culture   Final    NO  GROWTH 3 DAYS Performed at [facility], [address] [place], [place] [address]    Report Status PENDING  Incomplete  MRSA PCR Screening     Status: None   Collection Time: 07/29/18  4:18 PM  Result Value Ref Range Status   MRSA by PCR NEGATIVE NEGATIVE Final    Comment:        The GeneXpert MRSA Assay (FDA approved for NASAL specimens only), is one component of a comprehensive MRSA colonization surveillance program. It is not intended to diagnose MRSA infection nor to guide or monitor treatment for MRSA infections. Performed at [facility], [address] [place], [place] [address]     Studies/Results: Dg Chest Port 1 View  Result Date: 07/31/2018 CLINICAL DATA:  Acute respiratory failure EXAM: PORTABLE CHEST 1 VIEW COMPARISON:  07/30/2018 FINDINGS: Endotracheal tube and NG tube are unchanged. No confluent airspace opacities or effusions. Heart is normal size. No acute bony abnormality. IMPRESSION: No active disease. Electronically Signed   By: Rolm Baptise M.D.   On: 07/31/2018 07:45    Assessment/Plan: Maurice Patterson is a 62 y.o. male admitted with confusion, weakness, possible PNA. [place] Kitchen Has had MRI x 2, CT of brain with nothing acute. Seen by neurology. EEG with diffuse slowing. Utox neg, Flu neg, HIV neg, RPR neg, TSH neg, UA negative, abg unimpressive. CSF with very mild elevated wbc and protein.  I do not think he has meningitis or encephalitis and certainly does not have a bacterial meningitis with such a low wbc. He follows with neurology as  otpt for transient memory loss and expressive aphasia in past, hx of stroke and seizures.  I suspect current presentation is multifactorial.  12/7 high fever last night, diaphoretic, remains on O2 with increased resp distress today. Remains lethargic 12/8 transferred to unit for resp distress, cxr with bibasilar infiltrates, likely aspiration. wbc elevated.  12/9 temp to 100.9 but HD stable and stable on vent.  HSV PCR neg 12/10- febrile again to 102.5 but wbc down  to 10. Echo done and negative. However overall improving, not on pressors, improving on vent settings and wife reports he is waking up more Recommendations DC zosyn since possible drug fever. Start cefepime and flagyl Check sputum cx . Remains off vanco since MRSA PCR negative making MRSA pna very unlikely. ARF is improving.  Cont vent management per pulm  Cont neuro work up.   Thank you very much for the consult. Will follow with you.  Leonel Ramsay   08/01/2018, 12:32 PM

## 2018-08-01 NOTE — Progress Notes (Signed)
Pharmacy Antibiotic Note  Maurice Patterson is a 62 y.o. male admitted on 07/22/2018 with rule out meningitis.  Patient has been receiving Zosyn for aspiration PNA. Pharmacy now consulted for cefepime dosing. Patient is also order metronidazole.    Patient has a penicillin allergy but tolerated ampicillin on this admission and Zosyn.  HSV 1/2 DNA  Negative. Acyclovir Dc'd by ID on 12/9  Plan: Start cefepime 2g IV every 12 hours.   Height: 5\' 6"  (167.6 cm) Weight: 152 lb 5.4 oz (69.1 kg) IBW/kg (Calculated) : 63.8  Temp (24hrs), Avg:100.9 F (38.3 C), Min:100.2 F (37.9 C), Max:102.5 F (39.2 C)  Recent Labs  Lab 07/27/18 0505 07/28/18 0519 07/29/18 0324 07/30/18 0501 07/31/18 0528 08/01/18 0514  WBC 8.9 10.5  --  19.4* 18.6* 10.3  CREATININE 0.53* 0.57* 0.79 1.60* 1.59* 1.39*  VANCOTROUGH <4*  --   --   --   --   --     Estimated Creatinine Clearance: 50.4 mL/min (A) (by C-G formula based on SCr of 1.39 mg/dL (H)).     Antimicrobials this admission: 12/3 vanco >> 12/5, 12/7 >>12/8 12/3 ceftriaxone >> 12/5 12/3 acyclovir >> 12/9  12/3 ampicillin >> 12/5 12/1 azithro >>12/3 12/7 meropenem >>12/8 Zosyn 12/8>>  Microbiology results: 12/1 BCx: NG 11/30 UCx: NG 12/2 Influenza: neg 12/3 HSV PCR: negative  12/7 BCx: NGTD MRSA PCR neg  Thank you for allowing pharmacy to be a part of this patient's care.  Pernell Dupre, PharmD, BCPS Clinical Pharmacist 08/01/2018 12:49 PM

## 2018-08-01 NOTE — Progress Notes (Signed)
Pharmacy Antibiotic Note  Maurice Patterson is a 62 y.o. male admitted on 07/22/2018 with rule out meningitis.  Pharmacy has been consulted for Zosyn and acyclovir dosing. Per notes, patient has h/o HSV encephalitis. ID is continuing acyclovir pending HSV results - if neg PCR though can dc. Meropenem changed to Zosyn for aspiration PNA, vancomycin discontinued. He has a penicillin allergy but tolerated ampicillin on this admission.  HSV 1/2 DNA  Negative. Acyclovir Dc'd by ID on 12/9  Plan: Continue  Zosyn for aspiration PNA 3.375g EI every 8 hours  Height: 5\' 6"  (167.6 cm) Weight: 152 lb 5.4 oz (69.1 kg) IBW/kg (Calculated) : 63.8  Temp (24hrs), Avg:100.8 F (38.2 C), Min:99.7 F (37.6 C), Max:102.5 F (39.2 C)  Recent Labs  Lab 07/27/18 0505 07/28/18 0519 07/29/18 0324 07/30/18 0501 07/31/18 0528 08/01/18 0514  WBC 8.9 10.5  --  19.4* 18.6* 10.3  CREATININE 0.53* 0.57* 0.79 1.60* 1.59* 1.39*  VANCOTROUGH <4*  --   --   --   --   --     Estimated Creatinine Clearance: 50.4 mL/min (A) (by C-G formula based on SCr of 1.39 mg/dL (H)).     Antimicrobials this admission: 12/3 vanco >> 12/5, 12/7 >>12/8 12/3 ceftriaxone >> 12/5 12/3 acyclovir >> 12/9  12/3 ampicillin >> 12/5 12/1 azithro >>12/3 12/7 meropenem >>12/8 Zosyn 12/8>>  Microbiology results: 12/1 BCx: NG 11/30 UCx: NG 12/2 Influenza: neg 12/3 HSV PCR: negative  12/7 BCx: NGTD MRSA PCR neg  Thank you for allowing pharmacy to be a part of this patient's care.  Pernell Dupre, PharmD, BCPS Clinical Pharmacist 08/01/2018 8:52 AM

## 2018-08-01 NOTE — Care Management Note (Signed)
Case Management Note  Patient Details  Name: Maurice Patterson MRN: [id number] Date of Birth: [birth year]  Subjective/Objective:    Patient is admitted with acute respiratory failure requiring intubation.  Patient is currently on the vent, sedation is on hold for SBT.  Wife Keane Police is at the bedside.  Wife states that patient is independent and active at home.  They live in [place] and the patient assists in coaching soccer, they travel frequently.  Patient does not drive but the wife provides any needed transportation.  PCP verified as Dr. Astrid Divine, pt follows up every 3 months.  Pharmacy is [facility].  Patient's wife just wants her husband to get better and be able to go home.  He was hospitalized at [facility] 3 years ago and then sent to [facility] for [facility].  The wife reports that the experience at [facility] was not a good one.  Wife would prefer if patient needed SNF that he go to [facility] because she lives close by.  Wife would prefer that the patient be able to go home at discharge and would be happy with home health.   RNCM will cont to follow to assess for discharge needs as patient progresses. Doran Clay RN BSN [phone]                 Action/Plan:   Expected Discharge Date:  07/27/18               Expected Discharge Plan:     In-House Referral:     Discharge planning Services  CM Consult  Post Acute Care Choice:    Choice offered to:     DME Arranged:    DME Agency:     HH Arranged:    HH Agency:     Status of Service:  In process, will continue to follow  If discussed at Long Length of Stay Meetings, dates discussed:    Additional Comments:  Shelbie Hutching, RN 08/01/2018, 11:43 AM

## 2018-08-01 NOTE — Progress Notes (Signed)
Inpatient Diabetes Program Recommendations  AACE/ADA: New Consensus Statement on Inpatient Glycemic Control (2015)  Target Ranges:  Prepandial:   less than 140 mg/dL      Peak postprandial:   less than 180 mg/dL (1-2 hours)      Critically ill patients:  140 - 180 mg/dL   Results for BERNARDINO, DOWELL (MRN [id number]) as of 08/01/2018 07:57  Ref. Range 07/31/2018 23:21 08/01/2018 00:26 08/01/2018 01:30 08/01/2018 03:16 08/01/2018 04:02 08/01/2018 05:11 08/01/2018 05:55 08/01/2018 07:01  Glucose-Capillary Latest Ref Range: 70 - 99 mg/dL 139 (H)  1.1 units/hr 181 (H)  2.9 units/hr 177 (H)  2.8 units/hr 199 (H)  4.7 units/hr 179 (H)  4 units/hr 165 (H)  3.6 units/hr 175 (H)  3.9 units/hr 166 (H)  3.6 units/hr    Home DM Meds: NPH Insulin 32 units BID                             Regular Insulin 34 units TID with meals                             Regular Insulin 8 units for every 20 mg/dl >200 mg/dl  Current Orders: IV Insulin Drip      Patient may be ready to transition off the IV Insulin Drip this AM back to SQ basal/bolus regimen.  CBGs are consistently <180 mg/dl  Tube feeds are running at goal rate 60cc/hr  IV Insulin drip rates are less than 4 units/hr.  IV Steroids stopped.    MD- If you feel patient is ready to transition to SQ Insulin today, please consider custom orders for patient as he takes a fairly large amount of Insulin at home.  Recommend the following for transition:  1. Levemir 25 units BID (80% total home dose basal insulin--Takes NPH 32 units BID at home)  2. Novolog Resistant SSI (3-6-9 units) Q4 hours per the ICU Glycemic Control Protocol order set  3. Novolog Tube Feed Coverage: Novolog 4 units Q4 hours   (HOLD if tube feeds HELD for any reason)     --Will follow patient during hospitalization--  Wyn Quaker RN, MSN, CDE Diabetes Coordinator Inpatient Glycemic Control Team Team Pager: [phone]  (8a-5p)

## 2018-08-01 NOTE — Progress Notes (Signed)
CRITICAL CARE NOTE  CC  follow up respiratory failure/ventilator management  SUBJECTIVE Patient remains critically ill, remains on the ventilator.  Has not tolerated spontaneous breathing trials. Maurice Patterson is an 62 y.o. male  with an underlying past medical history of congestive heart failure, coronary artery disease, chronic renal failure, diabetes, hypertension, hyperlipidemia, gastroesophageal reflux disease, prior history of pancreatic cancer was brought in by EMS with complaints of weakness and progressive altered mental status. She was admitted to the hospital with a diagnosis of community-acquired pneumonia andencephalopathy. Felt to be metabolic in nature, neurology was consulted, negative work-up to include lumbar puncture and brain MRI.Patient developed progressive respiratory failure with presumptive aspiration. He was brought to the intensive care unit and started on BiPAP.  He unfortunately failed BiPAP trial and had to be intubated.  He has not tolerated spontaneous breathing trials.   SIGNIFICANT EVENTS Overnight, no significant issues.  Sedatives have been weaned off completely and will reassess with wake-up assessment and SBT.  Discussed with patient's wife at bedside.  lines, Airways, Drains:     Airway 8 mm (Active)  Secured at (cm) 25 cm 07/30/2018  2:43 AM  Measured From Lips 07/30/2018  2:43 AM  Secured Location Right 07/30/2018  2:43 AM  Secured By [employer] 07/30/2018  2:43 AM  Tube Holder Repositioned Yes 07/30/2018  2:43 AM  Cuff Pressure (cm H2O) 26 cm H2O 07/29/2018  9:50 PM  Site Condition Dry 07/30/2018  2:43 AM     NG/OG Tube Orogastric 16 Fr. Left mouth Xray Documented cm marking at nare/ corner of mouth 66 cm (Active)  Cm Marking at Nare/Corner of Mouth (if applicable) 66 cm 99/10/7167  8:00 PM  Site Assessment Clean;Dry;Intact 07/29/2018  8:00 PM  Ongoing Placement Verification Other (Comment) 07/29/2018  8:00 PM  Status Clamped 07/29/2018   8:00 PM     Urethral Catheter [name] Double-lumen;Latex 16 Fr. (Active)  Indication for Insertion or Continuance of Catheter Unstable critical patients (first 24-48 hours) 07/29/2018  8:00 PM  Site Assessment Clean;Intact 07/29/2018  8:00 PM  Catheter Maintenance Bag below level of bladder;Drainage bag/tubing not touching floor 07/29/2018  8:00 PM  Collection Container Standard drainage bag 07/29/2018  8:00 PM  Securement Method Securing device (Describe) 07/29/2018  8:00 PM  Urinary Catheter Interventions Unclamped 07/29/2018  8:00 PM  Output (mL) 155 mL 07/29/2018 10:25 PM     Ureteral Drain/Stent Left ureter 6 Fr. (Active)    Medication Administration Record has been reviewed in detail.  Disciplinary rounds have been performed with Pharm.D., RT, RD, case management, and bedside nurse.  Patient's wife at bedside and was apprised.  Review of Systems: Could not be obtained due to the patient being sedated and on mechanical ventilation. BP 137/69   Pulse 93   Temp 98.8 F (37.1 C) (Oral)   Resp 17   Ht 5\' 6"  (1.676 m)   Wt 69.1 kg   SpO2 98%   BMI 24.59 kg/m He is synchronous with the ventilator.  PHYSICAL EXAMINATION: Vital signs, I's and O's, nursing notes, reviewed. GENERAL:critically ill appearing, intubated, mechanically ventilated. HEAD: Normocephalic, atraumatic.  EYES: Pupils equal, round, reactive to light.  No scleral icterus.  MOUTH: Moist mucosal membranes. NECK: Supple. No thyromegaly. No nodules. No JVD.  PULMONARY: Coarse breath sounds throughout.  No wheezes or rhonchi noted. CARDIOVASCULAR: S1 and S2. Regular rate and rhythm.  There is a loud grade 3/6 crecendo/decrescendo systolic ejection murmur at the right upper sternal border radiation  to carotids.  Suspect AS. GASTROINTESTINAL: Soft, nontender, non-distended. No masses. Positive bowel sounds. No hepatosplenomegaly.  OG tube in place  MUSCULOSKELETAL: No swelling, clubbing, or edema.  NEUROLOGIC: Sedated,  will open eyes, not following one-step commands consistently. SKIN:intact,warm,dry      Indwelling Urinary Catheter continued, requirement due to   Reason to continue Indwelling Urinary Catheter for strict Intake/Output monitoring for hemodynamic instability   Central Line no central line   Reason to continue Central Line N/A   Ventilator continued, requirement due to, resp failure, failure of SBT    Ventilator Sedation RASS 0 to -2     ASSESSMENT AND PLAN SYNOPSIS   1.  Ventilator dependent respiratory failure/hypoxemic. Most likely aspiration in the setting encephalopathy.    He is presently tolerating mechanical ventilation, SBT started this morning and he is tolerating so far mild limitation to extubation is poor mental status.  All sedatives off.  He continues to be quite obtunded, wife reports that he usually takes long to recover from anesthesia and/or sedatives.  We have adjusted sedatives. Empiric antibiotics with vancomycin, meropenem and acyclovir.  2.  Toxic metabolic encephalopathy with negative imaging studies and lumbar puncture being followed by neurology  3.  Hyperglycemia.  On sliding scale insulin coverage, discontinue steroids as not indicated.  4.  Hypophosphatemia.  Replaced  5.  History of seizure disorder.  Patient is on Lamictal  6.  Leukocytosis, resolved.  Most likely secondary to pneumonia on empiric broad-spectrum coverage steroids discontinued which may have been aggravating this issue.  He has had fever however leukocytosis resolving and overall does not appear septic nor toxic.  7.  Acute renal failure renal insufficiency: BUN is 32creatinine 1.39, potassium stable at 4.3 and CO2 is 25.  Most likely  due to ATN.  8.  Cardiac murmur consistent with aortic stenosis, 2D echo showed trileaflet aortic valve with calcifications but no significant stenosis.  Murmur is unchanged today.  DVT/GI prophylaxis in place MONITOR FSBS ASSESS the need  for LABS as needed   Critical Care Time devoted to patient care services described in this note is 40 minutes Overall, patient is critically ill, prognosis is guarded.  Patient with multiorgan failure and at high risk for cardiac arrest and death.  Multidisciplinary rounds were performed as noted above.   Renold Don, MD [facility] Pulmonary/Critical Care Medicine and Advanced Bronchoscopy

## 2018-08-02 DIAGNOSIS — Z9911 Dependence on respirator [ventilator] status: Secondary | ICD-10-CM

## 2018-08-02 LAB — BASIC METABOLIC PANEL
ANION GAP: 7 (ref 5–15)
BUN: 26 mg/dL — ABNORMAL HIGH (ref 8–23)
CO2: 27 mmol/L (ref 22–32)
Calcium: 7.7 mg/dL — ABNORMAL LOW (ref 8.9–10.3)
Chloride: 105 mmol/L (ref 98–111)
Creatinine, Ser: 1.11 mg/dL (ref 0.61–1.24)
GFR calc Af Amer: >60 mL/min (ref >60)
Glucose, Bld: 401 mg/dL — ABNORMAL HIGH (ref 70–99)
Potassium: 3.3 mmol/L — ABNORMAL LOW (ref 3.5–5.1)
Sodium: 139 mmol/L (ref 135–145)

## 2018-08-02 LAB — CBC WITH DIFFERENTIAL/PLATELET
Abs Immature Granulocytes: 0.05 10*3/uL (ref 0.00–0.07)
BASOS PCT: 0 %
Basophils Absolute: 0 10*3/uL (ref 0.0–0.1)
EOS PCT: 1 %
Eosinophils Absolute: 0 10*3/uL (ref 0.0–0.5)
HCT: 33.4 % — ABNORMAL LOW (ref 39.0–52.0)
Hemoglobin: 10.5 g/dL — ABNORMAL LOW (ref 13.0–17.0)
IMMATURE GRANULOCYTES: 1 %
Lymphocytes Relative: 17 %
Lymphs Abs: 1.4 10*3/uL (ref 0.7–4.0)
MCH: 29.1 pg (ref 26.0–34.0)
MCHC: 31.4 g/dL (ref 30.0–36.0)
MCV: 92.5 fL (ref 80.0–100.0)
Monocytes Absolute: 0.6 10*3/uL (ref 0.1–1.0)
Monocytes Relative: 7 %
NRBC: 0 % (ref 0.0–0.2)
Neutro Abs: 5.9 10*3/uL (ref 1.7–7.7)
Neutrophils Relative %: 74 %
Platelets: 222 10*3/uL (ref 150–400)
RBC: 3.61 MIL/uL — ABNORMAL LOW (ref 4.22–5.81)
RDW: 15.3 % (ref 11.5–15.5)
WBC: 7.9 10*3/uL (ref 4.0–10.5)

## 2018-08-02 LAB — GLUCOSE, CAPILLARY
GLUCOSE-CAPILLARY: 207 mg/dL — AB (ref 70–99)
Glucose-Capillary: 299 mg/dL — ABNORMAL HIGH (ref 70–99)
Glucose-Capillary: 246 mg/dL — ABNORMAL HIGH (ref 70–99)
Glucose-Capillary: 218 mg/dL — ABNORMAL HIGH (ref 70–99)
Glucose-Capillary: 170 mg/dL — ABNORMAL HIGH (ref 70–99)
Glucose-Capillary: 147 mg/dL — ABNORMAL HIGH (ref 70–99)
Glucose-Capillary: 192 mg/dL — ABNORMAL HIGH (ref 70–99)
Glucose-Capillary: 175 mg/dL — ABNORMAL HIGH (ref 70–99)
Glucose-Capillary: 174 mg/dL — ABNORMAL HIGH (ref 70–99)

## 2018-08-02 MED ORDER — CHLORHEXIDINE GLUCONATE 0.12 % MT SOLN
OROMUCOSAL | Status: AC
  Administered 2018-08-02: 15 mL via OROMUCOSAL
  Filled 2018-08-02: qty 15

## 2018-08-02 MED ORDER — INSULIN ASPART 100 UNIT/ML ~~LOC~~ SOLN
0.0000 [IU] | SUBCUTANEOUS | Status: DC
  Administered 2018-08-02 – 2018-08-03 (×4): 3 [IU] via SUBCUTANEOUS
  Administered 2018-08-03: 8 [IU] via SUBCUTANEOUS
  Administered 2018-08-03: 3 [IU] via SUBCUTANEOUS
  Administered 2018-08-03: 5 [IU] via SUBCUTANEOUS
  Administered 2018-08-03: 2 [IU] via SUBCUTANEOUS
  Administered 2018-08-03: 5 [IU] via SUBCUTANEOUS
  Administered 2018-08-04: 8 [IU] via SUBCUTANEOUS
  Administered 2018-08-04 (×4): 5 [IU] via SUBCUTANEOUS
  Administered 2018-08-05: 15 [IU] via SUBCUTANEOUS
  Administered 2018-08-05: 8 [IU] via SUBCUTANEOUS
  Administered 2018-08-05: 11 [IU] via SUBCUTANEOUS
  Administered 2018-08-05: 15 [IU] via SUBCUTANEOUS
  Administered 2018-08-05: 8 [IU] via SUBCUTANEOUS
  Administered 2018-08-05 – 2018-08-06 (×2): 11 [IU] via SUBCUTANEOUS
  Administered 2018-08-06: 5 [IU] via SUBCUTANEOUS
  Administered 2018-08-06: 8 [IU] via SUBCUTANEOUS
  Administered 2018-08-06: 11 [IU] via SUBCUTANEOUS
  Administered 2018-08-06: 15 [IU] via SUBCUTANEOUS
  Administered 2018-08-06: 8 [IU] via SUBCUTANEOUS
  Administered 2018-08-07: 5 [IU] via SUBCUTANEOUS
  Administered 2018-08-07: 8 [IU] via SUBCUTANEOUS
  Administered 2018-08-07: 15 [IU] via SUBCUTANEOUS
  Administered 2018-08-07: 8 [IU] via SUBCUTANEOUS
  Administered 2018-08-07: 5 [IU] via SUBCUTANEOUS
  Administered 2018-08-07: 15 [IU] via SUBCUTANEOUS
  Administered 2018-08-08: 8 [IU] via SUBCUTANEOUS
  Administered 2018-08-08: 3 [IU] via SUBCUTANEOUS
  Administered 2018-08-08: 8 [IU] via SUBCUTANEOUS
  Administered 2018-08-08: 3 [IU] via SUBCUTANEOUS
  Administered 2018-08-08: 5 [IU] via SUBCUTANEOUS
  Administered 2018-08-09 (×2): 8 [IU] via SUBCUTANEOUS
  Administered 2018-08-09 (×2): 11 [IU] via SUBCUTANEOUS
  Administered 2018-08-09 (×2): 8 [IU] via SUBCUTANEOUS
  Administered 2018-08-09 – 2018-08-10 (×2): 5 [IU] via SUBCUTANEOUS
  Administered 2018-08-10: 8 [IU] via SUBCUTANEOUS
  Administered 2018-08-10: 5 [IU] via SUBCUTANEOUS
  Administered 2018-08-10: 11 [IU] via SUBCUTANEOUS
  Administered 2018-08-10: 8 [IU] via SUBCUTANEOUS
  Administered 2018-08-10: 5 [IU] via SUBCUTANEOUS
  Administered 2018-08-11 (×3): 3 [IU] via SUBCUTANEOUS
  Administered 2018-08-11 (×2): 2 [IU] via SUBCUTANEOUS
  Administered 2018-08-12 (×2): 3 [IU] via SUBCUTANEOUS
  Administered 2018-08-12 – 2018-08-13 (×3): 2 [IU] via SUBCUTANEOUS
  Administered 2018-08-13 (×2): 3 [IU] via SUBCUTANEOUS
  Administered 2018-08-13 (×2): 2 [IU] via SUBCUTANEOUS
  Administered 2018-08-14 (×2): 3 [IU] via SUBCUTANEOUS
  Administered 2018-08-14: 2 [IU] via SUBCUTANEOUS
  Administered 2018-08-14 – 2018-08-15 (×3): 3 [IU] via SUBCUTANEOUS
  Administered 2018-08-15: 5 [IU] via SUBCUTANEOUS
  Administered 2018-08-15 (×2): 3 [IU] via SUBCUTANEOUS
  Administered 2018-08-15: 5 [IU] via SUBCUTANEOUS
  Administered 2018-08-16: 8 [IU] via SUBCUTANEOUS
  Administered 2018-08-16: 10 [IU] via SUBCUTANEOUS
  Administered 2018-08-16: 5 [IU] via SUBCUTANEOUS
  Administered 2018-08-16: 3 [IU] via SUBCUTANEOUS
  Administered 2018-08-16: 5 [IU] via SUBCUTANEOUS
  Administered 2018-08-16 – 2018-08-17 (×3): 8 [IU] via SUBCUTANEOUS
  Administered 2018-08-17: 11 [IU] via SUBCUTANEOUS
  Administered 2018-08-17: 5 [IU] via SUBCUTANEOUS
  Administered 2018-08-17: 11 [IU] via SUBCUTANEOUS
  Administered 2018-08-17: 8 [IU] via SUBCUTANEOUS
  Administered 2018-08-18 (×2): 3 [IU] via SUBCUTANEOUS
  Administered 2018-08-18: 5 [IU] via SUBCUTANEOUS
  Administered 2018-08-18: 8 [IU] via SUBCUTANEOUS
  Administered 2018-08-18: 5 [IU] via SUBCUTANEOUS
  Administered 2018-08-18: 2 [IU] via SUBCUTANEOUS
  Administered 2018-08-19 (×2): 8 [IU] via SUBCUTANEOUS
  Administered 2018-08-19 (×2): 5 [IU] via SUBCUTANEOUS
  Administered 2018-08-19: 8 [IU] via SUBCUTANEOUS
  Administered 2018-08-19 (×2): 5 [IU] via SUBCUTANEOUS
  Administered 2018-08-20 (×2): 8 [IU] via SUBCUTANEOUS
  Administered 2018-08-20: 11 [IU] via SUBCUTANEOUS
  Administered 2018-08-20 (×3): 8 [IU] via SUBCUTANEOUS
  Administered 2018-08-21: 5 [IU] via SUBCUTANEOUS
  Administered 2018-08-21: 8 [IU] via SUBCUTANEOUS
  Administered 2018-08-21: 5 [IU] via SUBCUTANEOUS
  Administered 2018-08-21 (×2): 8 [IU] via SUBCUTANEOUS
  Administered 2018-08-22: 3 [IU] via SUBCUTANEOUS
  Administered 2018-08-22: 5 [IU] via SUBCUTANEOUS
  Administered 2018-08-22 (×2): 3 [IU] via SUBCUTANEOUS
  Administered 2018-08-22: 5 [IU] via SUBCUTANEOUS
  Administered 2018-08-22: 3 [IU] via SUBCUTANEOUS
  Administered 2018-08-22: 5 [IU] via SUBCUTANEOUS
  Administered 2018-08-23 (×2): 3 [IU] via SUBCUTANEOUS
  Administered 2018-08-23: 5 [IU] via SUBCUTANEOUS
  Administered 2018-08-23 (×2): 3 [IU] via SUBCUTANEOUS
  Administered 2018-08-24: 2 [IU] via SUBCUTANEOUS
  Administered 2018-08-24: 3 [IU] via SUBCUTANEOUS
  Administered 2018-08-24: 2 [IU] via SUBCUTANEOUS
  Administered 2018-08-24: 3 [IU] via SUBCUTANEOUS
  Administered 2018-08-25 (×2): 2 [IU] via SUBCUTANEOUS
  Administered 2018-08-25: 3 [IU] via SUBCUTANEOUS
  Administered 2018-08-25 (×2): 2 [IU] via SUBCUTANEOUS
  Administered 2018-08-26: 3 [IU] via SUBCUTANEOUS
  Administered 2018-08-26: 5 [IU] via SUBCUTANEOUS
  Administered 2018-08-26: 3 [IU] via SUBCUTANEOUS
  Administered 2018-08-26 (×2): 2 [IU] via SUBCUTANEOUS
  Administered 2018-08-26: 3 [IU] via SUBCUTANEOUS
  Administered 2018-08-27 – 2018-08-28 (×6): 2 [IU] via SUBCUTANEOUS
  Administered 2018-08-29: 3 [IU] via SUBCUTANEOUS
  Administered 2018-08-29 (×2): 2 [IU] via SUBCUTANEOUS
  Administered 2018-08-30: 3 [IU] via SUBCUTANEOUS
  Administered 2018-08-30 – 2018-08-31 (×3): 2 [IU] via SUBCUTANEOUS
  Administered 2018-08-31: 3 [IU] via SUBCUTANEOUS
  Administered 2018-08-31: 5 [IU] via SUBCUTANEOUS
  Administered 2018-08-31 – 2018-09-01 (×3): 2 [IU] via SUBCUTANEOUS
  Administered 2018-09-01 (×4): 3 [IU] via SUBCUTANEOUS
  Administered 2018-09-01: 2 [IU] via SUBCUTANEOUS
  Administered 2018-09-02 (×2): 5 [IU] via SUBCUTANEOUS
  Administered 2018-09-02 (×3): 3 [IU] via SUBCUTANEOUS
  Administered 2018-09-02: 5 [IU] via SUBCUTANEOUS
  Administered 2018-09-03: 3 [IU] via SUBCUTANEOUS
  Administered 2018-09-03: 5 [IU] via SUBCUTANEOUS
  Administered 2018-09-03: 3 [IU] via SUBCUTANEOUS
  Administered 2018-09-03: 2 [IU] via SUBCUTANEOUS
  Administered 2018-09-03 (×2): 3 [IU] via SUBCUTANEOUS
  Administered 2018-09-04: 2 [IU] via SUBCUTANEOUS
  Administered 2018-09-04: 5 [IU] via SUBCUTANEOUS
  Administered 2018-09-04 (×2): 3 [IU] via SUBCUTANEOUS
  Administered 2018-09-04 – 2018-09-05 (×2): 2 [IU] via SUBCUTANEOUS
  Administered 2018-09-05: 8 [IU] via SUBCUTANEOUS
  Administered 2018-09-05 (×2): 5 [IU] via SUBCUTANEOUS
  Administered 2018-09-05: 3 [IU] via SUBCUTANEOUS
  Administered 2018-09-06: 8 [IU] via SUBCUTANEOUS
  Administered 2018-09-06: 3 [IU] via SUBCUTANEOUS
  Administered 2018-09-06 (×3): 5 [IU] via SUBCUTANEOUS
  Administered 2018-09-07 (×3): 3 [IU] via SUBCUTANEOUS
  Administered 2018-09-07: 2 [IU] via SUBCUTANEOUS
  Administered 2018-09-08 (×3): 3 [IU] via SUBCUTANEOUS
  Filled 2018-08-02 (×195): qty 1

## 2018-08-02 MED ORDER — PHENOL 1.4 % MT LIQD
1.0000 | OROMUCOSAL | Status: DC | PRN
  Administered 2018-08-02: 1 via OROMUCOSAL
  Filled 2018-08-02: qty 177

## 2018-08-02 MED ORDER — INSULIN REGULAR(HUMAN) IN NACL 100-0.9 UT/100ML-% IV SOLN
INTRAVENOUS | Status: DC
  Administered 2018-08-02: 1.9 [IU]/h via INTRAVENOUS
  Filled 2018-08-02: qty 100

## 2018-08-02 MED ORDER — SODIUM CHLORIDE 0.9% FLUSH
10.0000 mL | Freq: Two times a day (BID) | INTRAVENOUS | Status: DC
  Administered 2018-08-02 – 2018-08-11 (×19): 10 mL
  Administered 2018-08-11: 20 mL
  Administered 2018-08-12 (×2): 10 mL
  Administered 2018-08-13: 20 mL
  Administered 2018-08-13 – 2018-08-15 (×3): 10 mL

## 2018-08-02 MED ORDER — VITAMIN B-1 100 MG PO TABS
100.0000 mg | ORAL_TABLET | Freq: Every day | ORAL | Status: DC
  Administered 2018-08-04 – 2018-08-07 (×4): 100 mg via ORAL
  Filled 2018-08-02 (×4): qty 1

## 2018-08-02 MED ORDER — THIAMINE HCL 100 MG/ML IJ SOLN
100.0000 mg | Freq: Once | INTRAMUSCULAR | Status: AC
  Administered 2018-08-02: 100 mg via INTRAVENOUS
  Filled 2018-08-02: qty 2

## 2018-08-02 MED ORDER — POTASSIUM CHLORIDE 20 MEQ/15ML (10%) PO SOLN
20.0000 meq | ORAL | Status: AC
  Administered 2018-08-02 (×2): 20 meq
  Filled 2018-08-02 (×3): qty 15

## 2018-08-02 MED ORDER — SODIUM CHLORIDE 0.9% FLUSH: 10.0000 mL | INTRAVENOUS | Status: DC | PRN

## 2018-08-02 MED ORDER — INSULIN ASPART 100 UNIT/ML ~~LOC~~ SOLN
4.0000 [IU] | SUBCUTANEOUS | Status: DC
  Administered 2018-08-02: 4 [IU] via SUBCUTANEOUS
  Filled 2018-08-02: qty 1

## 2018-08-02 NOTE — Progress Notes (Signed)
[facility] [place] NAME: Maurice Patterson    MR#:  [id number]  DATE OF BIRTH:  1955-10-25  SUBJECTIVE:  CHIEF COMPLAINT:  No chief complaint on file. Patient came in with altered mental status and got intubated for possible aspiration pneumonia Off Precedex drip,SBT again today Family at bed side  REVIEW OF SYSTEMS:  Review of Systems  Unable to perform ROS: Mental status change  Constitutional: Positive for diaphoresis.    DRUG ALLERGIES:   Allergies  Allergen Reactions  . Hydrocodone Anaphylaxis  . Morphine Other (See Comments)    Loss of memory  . Ambien [Zolpidem] Other (See Comments)    delirium    . Brilinta [Ticagrelor] Other (See Comments)    Stroke   . Flexeril [Cyclobenzaprine] Other (See Comments)    delerium   . Flunitrazepam Other (See Comments)    ROHYPNOL (hallucinations)  . Haldol [Haloperidol Lactate] Other (See Comments)    delerium   . Levetiracetam Diarrhea and Other (See Comments)    Unable to walk  . Lorazepam Hives  . Risperdal [Risperidone] Other (See Comments)    Delirium   . Trazodone Other (See Comments)    Delirium Can take in low doses   . Benadryl [Diphenhydramine Hcl (Sleep)] Rash  . Penicillins Rash    Mouth ulcers Has patient had a PCN reaction causing immediate rash, facial/tongue/throat swelling, SOB or lightheadedness with hypotension: Yes Has patient had a PCN reaction causing severe rash involving mucus membranes or skin necrosis: No Has patient had a PCN reaction that required hospitalization No Has patient had a PCN reaction occurring within the last 10 years: Yes If all of the above answers are "NO", then may proceed with Cephalosporin use.    VITALS:  Blood pressure 135/72, pulse 91, temperature 98.6 F (37 C), temperature source Oral, resp. rate 18, height 5\' 6"  (1.676 m), weight 69.3 kg, SpO2 98 %.  PHYSICAL EXAMINATION:  Physical Exam  GENERAL:  62 y.o.-year-old  patient lying in the bed with no acute distress.  EYES: Pupils equal, round, reactive to light and accommodation. No scleral icterus. HEENT: Head atraumatic, normocephalic. Oropharynx and nasopharynx clear. Dry mucus membranes ET tube intact NECK:  Supple, no jugular venous distention. No thyroid enlargement, no tenderness.  LUNGS: Normal breath sounds bilaterally, increased rate, no wheezing, some crepitation.  CARDIOVASCULAR: S1, S2 normal.  Systolic murmur is present, no rubs, or gallops.  ABDOMEN: Soft, nontender, nondistended. Bowel sounds present. No organomegaly or mass.  EXTREMITIES: No pedal edema, cyanosis, or clubbing.  NEUROLOGIC: Sedated stiffer left upper extremity, opening eyes to tactile stimulation  PSYCHIATRIC:   Not following commands.  Withdrawing to touch. SKIN: No obvious rash, lesion, or ulcer.    LABORATORY PANEL:   CBC Recent Labs  Lab 08/02/18 0421  WBC 7.9  HGB 10.5*  HCT 33.4*  PLT 222   ------------------------------------------------------------------------------------------------------------------  Chemistries  Recent Labs  Lab 07/30/18 0501 07/31/18 0528  08/02/18 0421  NA 142 150*   < > 139  K 4.8 4.3   < > 3.3*  CL 111 117*   < > 105  CO2 25 28   < > 27  GLUCOSE 373* 127*   < > 401*  BUN 26* 28*   < > 26*  CREATININE 1.60* 1.59*   < > 1.11  CALCIUM 8.1* 8.2*   < > 7.7*  MG 2.1 2.3  --   --   AST 17  --   --   --  ALT 15  --   --   --   ALKPHOS 117  --   --   --   BILITOT 0.4  --   --   --    < > = values in this interval not displayed.   ------------------------------------------------------------------------------------------------------------------  Cardiac Enzymes No results for input(s): TROPONINI in the last 168 hours. ------------------------------------------------------------------------------------------------------------------  RADIOLOGY:  No results found.  EKG:   Orders placed or performed during the hospital  encounter of 07/22/18  . EKG 12-Lead  . EKG 12-Lead  . EKG 12-Lead  . EKG 12-Lead  . ED EKG  . ED EKG    ASSESSMENT AND PLAN:   62 year old male with past medical history significant for CAD, CHF, CKD, dementia, diabetes, hypertension and seizure disorder presents to hospital secondary to worsening weakness and poor appetite  Acute respiratory failure with hypoxemia On ventilator 30% FiO2-vent protocol Possible aspiration pneumonia on broad-spectrum IV antibiotics SBAT per intensivist  -Acute metabolic encephalopathy with past medical history of HSV encephalitis CT head is negative LP done and no evidence of bacterial meningitis with normal WBC and elevated glucose and protein HSV PCR test is negative and acyclovir discontinued per ID Appreciate ID and neurology recommendations Gram stain is negative.  Cultures on CSF is negative. Initial EEG without seizure activity but generalized slowing.  Repeat EEG - no seizure like activities.   -Aspiration pneumonia- ac respi failure with hypoxia On ventilator 30% FiO2  07/29/18-had fever and worsening on chest x-ray. Started on broad-spectrum antibiotics Zosyn for aspiration pneumonia 07/31/2018-100.9 08/01/2018-temperature 102.5 but leukocytosis is trending down ID has recommended to discontinue Zosyn and started patient on cefepime and Flagyl.  Check sputum culture  repeat blood cultures negative MRSA PCR negative.  Vanga discontinued Pt is intubated  And on ventilator support. On Levophed drip to use as needed   --  Hypokalemia- replaced.  -Hyponatremia improved and sodium at 139  -  Diabetes mellitus on insulin drip.  -  History of seizure disorder/myoclonic jerks-on Lamictal. -According to wife, patient does not have any history of seizures -EEG with generalized slowing, no seizure-like activity.  Empirically Vimpat was added by neurology  -  DVT prophylaxis-on Lovenox   -. Nutrition- Failed attempt for PICC line  initially.  Now on vent, Feeding started. May need to have arrangements for long term feeding.    All the records are reviewed and case discussed with Care Management/Social Workerr. Management plans discussed with the patient, family and they are in agreement.  CODE STATUS: Full Code  TOTAL TIME TAKING CARE OF THIS PATIENT: 35 minutes.   POSSIBLE D/C IN 2 DAYS, DEPENDING ON CLINICAL CONDITION.   [name] M.D on 08/02/2018 at 4:37 PM  Between 7am to 6pm - Pager - [phone]  After 6pm go to [email or web address] - password [email or web address] [facility]  Office  [phone]  CC: Primary care physician; [name], MD

## 2018-08-02 NOTE — Progress Notes (Signed)
[facility] INFECTIOUS [facility] PROGRESS NOTE Date of Admission:  07/22/2018     ID: Maurice Patterson is a 62 y.o. male with pneumonia and AMS  Principal Problem:   CAP (community acquired pneumonia) Active Problems:   Uncontrolled type 2 diabetes mellitus with hyperglycemia, with long-term current use of insulin (HCC)   Essential hypertension   Alzheimer's dementia ([place])   GERD (gastroesophageal reflux disease)   Fever   Acute respiratory failure (HCC)  Subjective: Remains intubated.  No  fevers.  Off sedation but not really responsive.  ROS  Unable to obtain  Medications:  Antibiotics Given (last 72 hours)    Date/Time Action Medication Dose Rate   07/30/18 1418 New Bag/Given   piperacillin-tazobactam (ZOSYN) IVPB 3.375 g 3.375 g 12.5 mL/hr   07/30/18 2334 New Bag/Given   piperacillin-tazobactam (ZOSYN) IVPB 3.375 g 3.375 g 12.5 mL/hr   07/31/18 7482 New Bag/Given   piperacillin-tazobactam (ZOSYN) IVPB 3.375 g 3.375 g 12.5 mL/hr   07/31/18 1532 New Bag/Given   piperacillin-tazobactam (ZOSYN) IVPB 3.375 g 3.375 g 12.5 mL/hr   07/31/18 2229 New Bag/Given   piperacillin-tazobactam (ZOSYN) IVPB 3.375 g 3.375 g 12.5 mL/hr   08/01/18 0517 New Bag/Given   piperacillin-tazobactam (ZOSYN) IVPB 3.375 g 3.375 g 12.5 mL/hr   08/01/18 1332 New Bag/Given   ceFEPIme (MAXIPIME) 2 g in sodium chloride 0.9 % 100 mL IVPB 2 g 200 mL/hr   08/01/18 1838 Given   metroNIDAZOLE (FLAGYL) tablet 500 mg 500 mg    08/01/18 2122 New Bag/Given   ceFEPIme (MAXIPIME) 2 g in sodium chloride 0.9 % 100 mL IVPB 2 g 200 mL/hr   08/01/18 2130 Given   metroNIDAZOLE (FLAGYL) tablet 500 mg 500 mg    08/02/18 7078 Given   metroNIDAZOLE (FLAGYL) tablet 500 mg 500 mg    08/02/18 0956 New Bag/Given   ceFEPIme (MAXIPIME) 2 g in sodium chloride 0.9 % 100 mL IVPB 2 g 200 mL/hr     . aspirin  81 mg Oral Daily  . chlorhexidine gluconate (MEDLINE KIT)  15 mL Mouth Rinse BID  . donepezil  10 mg Oral BID  .  enoxaparin (LOVENOX) injection  40 mg Subcutaneous Q24H  . free water  200 mL Per Tube Q4H  . ipratropium-albuterol  3 mL Nebulization Q4H  . lamoTRIgine  100 mg Oral BID  . lidocaine  1 patch Transdermal Q24H  . mouth rinse  15 mL Mouth Rinse 10 times per day  . memantine  10 mg Oral BID  . metoprolol tartrate  25 mg Per Tube BID  . metroNIDAZOLE  500 mg Per Tube TID  . pantoprazole (PROTONIX) IV  40 mg Intravenous QHS  . polyethylene glycol  17 g Per Tube Daily  . sennosides  5 mL Per Tube QHS  . sodium chloride flush  10-40 mL Intracatheter Q12H  . [START ON 08/03/2018] thiamine  100 mg Oral Daily    Objective: Vital signs in last 24 hours: Temp:  [97.9 F (36.6 C)-99.4 F (37.4 C)] 99.3 F (37.4 C) (12/11 0700) Pulse Rate:  [72-107] 89 (12/11 1200) Resp:  [13-30] 13 (12/11 1200) BP: (97-218)/(55-111) 139/77 (12/11 1200) SpO2:  [96 %-100 %] 99 % (12/11 1200) FiO2 (%):  [30 %] 30 % (12/11 1116) Weight:  [69.3 kg] 69.3 kg (12/11 0500) Physical Exam  Constitutional: extubated, lethargic HENT: aniceric,  reactive Mouth/Throat: MM dry Cardiovascular: Normal rate, regular rhythm and normal heart sounds.  Pulmonary/Chest: bil rhonchi and poor air movement  Abdominal: Soft. Bowel sounds are normal. He exhibits no distension. There is no tenderness.  Lymphadenopathy: He has no cervical adenopathy.  Neurological: off sedation but remains very lethargic R groin TLC Skin: Skin is warm and dry. No rash noted. No erythema.  Psychiatric: min responsive Ext trace edema bil  Lab Results Recent Labs    08/01/18 0514 08/02/18 0421  WBC 10.3 7.9  HGB 10.3* 10.5*  HCT 33.0* 33.4*  NA 150* 139  K 4.3 3.3*  CL 120* 105  CO2 25 27  BUN 32* 26*  CREATININE 1.39* 1.11    Microbiology: Results for orders placed or performed during the hospital encounter of 07/22/18  Urine culture     Status: None   Collection Time: 07/22/18 11:03 PM  Result Value Ref Range Status   Specimen  Description   Final    URINE, RANDOM Performed at [facility], [address] [place], [place] [address]    Special Requests   Final    NONE Performed at [facility], [address] [place], [place] [address]    Culture   Final    NO GROWTH Performed at [facility], [place] [address], [place], [place] [address]    Report Status 07/24/2018 FINAL  Final  CULTURE, BLOOD (ROUTINE X 2) w Reflex to ID Panel     Status: None   Collection Time: 07/23/18 10:06 AM  Result Value Ref Range Status   Specimen Description BLOOD BLOOD LEFT ARM  Final   Special Requests   Final    BOTTLES DRAWN AEROBIC AND ANAEROBIC Blood Culture adequate volume   Culture   Final    NO GROWTH 5 DAYS Performed at [facility], [address] [place], [place] [address]    Report Status 07/28/2018 FINAL  Final  CULTURE, BLOOD (ROUTINE X 2) w Reflex to ID Panel     Status: None   Collection Time: 07/23/18 10:17 AM  Result Value Ref Range Status   Specimen Description BLOOD BLOOD LEFT WRIST  Final   Special Requests   Final    BOTTLES DRAWN AEROBIC AND ANAEROBIC Blood Culture adequate volume   Culture   Final    NO GROWTH 5 DAYS Performed at [facility], [address] [place], [place] [address]    Report Status 07/28/2018 FINAL  Final  Gram stain     Status: None   Collection Time: 07/26/18  3:11 PM  Result Value Ref Range Status   Specimen Description CSF  Final   Special Requests NONE  Final   Gram Stain   Final    NO ORGANISMS SEEN WBC SEEN Performed at [facility], [address] [place], [place] [address]    Report Status 07/26/2018 FINAL  Final  CSF culture     Status: None   Collection Time: 07/26/18  3:11 PM  Result Value Ref Range Status   Specimen Description   Final    CSF Performed at [facility], [address] [place], [place] [address]    Special Requests   Final    NONE Performed at [facility], [address] [place], [place] [address]    Gram Stain   Final    NO ORGANISMS SEEN WBC SEEN NO RBC SEEN Performed at [facility], [address], [place], [place] [address]    Culture   Final    NO GROWTH 3 DAYS Performed at [facility], [address], [place], [place] [address]    Report Status 07/30/2018 FINAL  Final  Fungus Culture With Stain     Status: None (Preliminary result)   Collection Time: 07/26/18  3:11 PM  Result Value Ref Range Status   Fungus Stain Final report  Final    Comment: (NOTE) Performed At: [facility] [place], [place] [id number] Rush Farmer MD [id number]    Fungus (Mycology) Culture PENDING  Incomplete   Fungal Source CSF  Final    Comment: Performed at [facility], [address] [place], [place] [address]  Fungus Culture Result     Status: None   Collection Time: 07/26/18  3:11 PM  Result Value Ref Range Status   Result 1 Comment  Final    Comment: (NOTE) KOH/Calcofluor preparation:  no fungus observed. Performed At: [facility] [place], [place] [id number] Rush Farmer MD [id number]   CULTURE, BLOOD (ROUTINE X 2) w Reflex to ID Panel     Status: None (Preliminary result)   Collection Time: 07/29/18  6:14 AM  Result Value Ref Range Status   Specimen Description BLOOD LEFT FOREARM  Final   Special Requests   Final    BOTTLES DRAWN AEROBIC AND ANAEROBIC Blood Culture adequate volume   Culture   Final    NO GROWTH 4 DAYS Performed at [facility], [address], [place], [place] [address]    Report Status PENDING  Incomplete  CULTURE, BLOOD (ROUTINE X 2) w Reflex to ID Panel     Status: None (Preliminary result)   Collection Time: 07/29/18  6:17 AM  Result Value Ref Range Status   Specimen Description BLOOD LEFT HAND  Final   Special Requests   Final    BOTTLES DRAWN AEROBIC AND ANAEROBIC Blood Culture adequate volume   Culture   Final    NO  GROWTH 4 DAYS Performed at [facility], [address] [place], [place] [address]    Report Status PENDING  Incomplete  MRSA PCR Screening     Status: None   Collection Time: 07/29/18  4:18 PM  Result Value Ref Range Status   MRSA by PCR NEGATIVE NEGATIVE Final    Comment:        The GeneXpert MRSA Assay (FDA approved for NASAL specimens only), is one component of a comprehensive MRSA colonization surveillance program. It is not intended to diagnose MRSA infection nor to guide or monitor treatment for MRSA infections. Performed at [facility], [address] [place], [place] [address]   Culture, respiratory     Status: None (Preliminary result)   Collection Time: 08/01/18  3:27 PM  Result Value Ref Range Status   Specimen Description   Final    TRACHEAL ASPIRATE Performed at [facility], [address], [place], [place] [address]    Special Requests   Final    NONE Performed at [facility], [address] [place], [place] [address]    Gram Stain   Final    MODERATE WBC PRESENT,BOTH PMN AND MONONUCLEAR RARE GRAM POSITIVE COCCI    Culture   Final    CULTURE REINCUBATED FOR BETTER GROWTH Performed at [facility], [place] [address] [place],  [address]    Report Status PENDING  Incomplete    Studies/Results: No results found.  Assessment/Plan: BRENTLEE DELAGE is a 62 y.o. male admitted with confusion, weakness, possible PNA. Marland Kitchen Has had  MRI x 2, CT of brain with nothing acute. Seen by neurology. EEG with diffuse slowing. Utox neg, Flu neg, HIV neg, RPR neg, TSH neg, UA negative, abg unimpressive. CSF with very mild elevated wbc and protein.  I do not think he has meningitis or encephalitis and certainly does not have a bacterial meningitis with such a low wbc. He follows with neurology as otpt for transient memory loss and expressive aphasia in past, hx of stroke and seizures.  I suspect current presentation is  multifactorial.  12/7 high fever last night, diaphoretic, remains on O2 with increased resp distress today. Remains lethargic 12/8 transferred to unit for resp distress, cxr with bibasilar infiltrates, likely aspiration. wbc elevated.  12/9 temp to 100.9 but HD stable and stable on vent. HSV PCR neg 12/10- febrile again to 102.5 but wbc down  to 10. Echo done and negative. However overall improving, not on pressors, improving on vent settings and wife reports he is waking up more 12/11- Extubated today. no fevers, changed zosyn to cefepime and flagyl. WBC down to 7.8   Recommendations. Cont cefepime and flagyl - stop date of 12/13 sputum cx with GPC . Remain off vanco since MRSA PCR negative making MRSA pna very unlikely. ARF is improving.  Cont neuro work up.  Thank you very much for the consult. Will follow with you.  Leonel Ramsay   08/02/2018, 1:09 PM

## 2018-08-02 NOTE — Progress Notes (Signed)
Patient was extubated to 4L nasal cannula per Dr. Patsey Berthold order. No stridor present. Patient oxygen saturation 96% on 4L.

## 2018-08-02 NOTE — Progress Notes (Signed)
Pharmacy Antibiotic Note  Maurice Patterson is a 62 y.o. male admitted on 07/22/2018 with rule out meningitis.  Patient has been receiving Zosyn for aspiration PNA. Patient spiked fever of  102.5 on 12/10. Pharmacy now consulted for cefepime dosing. Patient is also ordered metronidazole.  Zosyn discontinued since possible drug fever.   Patient has a penicillin allergy but tolerated ampicillin on this admission and Zosyn.  HSV 1/2 DNA  Negative. Acyclovir Dc'd by ID on 12/9  Plan: Continue cefepime 2g IV every 12 hours.   Height: 5\' 6"  (167.6 cm) Weight: 152 lb 12.5 oz (69.3 kg) IBW/kg (Calculated) : 63.8  Temp (24hrs), Avg:99.2 F (37.3 C), Min:97.9 F (36.6 C), Max:101 F (38.3 C)  Recent Labs  Lab 07/27/18 0505 07/28/18 0519 07/29/18 0324 07/30/18 0501 07/31/18 0528 08/01/18 0514 08/02/18 0421  WBC 8.9 10.5  --  19.4* 18.6* 10.3 7.9  CREATININE 0.53* 0.57* 0.79 1.60* 1.59* 1.39* 1.11  VANCOTROUGH <4*  --   --   --   --   --   --     Estimated Creatinine Clearance: 63.1 mL/min (by C-G formula based on SCr of 1.11 mg/dL).     Antimicrobials this admission: 12/3 vanco >> 12/5, 12/7 >>12/8 12/3 ceftriaxone >> 12/5 12/3 acyclovir >> 12/9  12/3 ampicillin >> 12/5 12/1 azithro >>12/3 12/7 meropenem >>12/8 12/8 Zosyn >> 12/10 12/10 Cefepime>>    Microbiology results: 12/1 BCx: NG 11/30 UCx: NG 12/2 Influenza: neg 12/3 HSV PCR: negative  12/7 BCx: NGTD MRSA PCR neg  Thank you for allowing pharmacy to be a part of this patient's care.  Pernell Dupre, PharmD, BCPS Clinical Pharmacist 08/02/2018 8:18 AM

## 2018-08-02 NOTE — Progress Notes (Signed)
CRITICAL CARE NOTE  CC  follow up respiratory failure/ventilator management  SUBJECTIVE Maurice Patterson is an 62 y.o. male  with an underlying past medical history of congestive heart failure, coronary artery disease, chronic renal failure, diabetes, hypertension, hyperlipidemia, gastroesophageal reflux disease, prior history of pancreatic cancer was brought in by EMS with complaints of weakness and progressive altered mental status. She was admitted to the hospital with a diagnosis of community-acquired pneumonia andencephalopathy. Felt to be metabolic in nature, neurology was consulted, negative work-up to include lumbar puncture and brain MRI.Patient developed progressive respiratory failure with presumptive aspiration. He was brought to the intensive care unit and started on BiPAP.  He unfortunately failed BiPAP trial and had to be intubated.   SIGNIFICANT EVENTS Overnight: Patient had to be re-sedated due to agitation however sedatives are greatly reduced.  These have been continued today and the patient is following simple commands and is more arousable.    Lines, Airways, Drains:     Airway 8 mm (Active)  Secured at (cm) 25 cm 07/30/2018  2:43 AM  Measured From Lips 07/30/2018  2:43 AM  Secured Location Right 07/30/2018  2:43 AM  Secured By [employer] 07/30/2018  2:43 AM  Tube Holder Repositioned Yes 07/30/2018  2:43 AM  Cuff Pressure (cm H2O) 26 cm H2O 07/29/2018  9:50 PM  Site Condition Dry 07/30/2018  2:43 AM     NG/OG Tube Orogastric 16 Fr. Left mouth Xray Documented cm marking at nare/ corner of mouth 66 cm (Active)  Cm Marking at Nare/Corner of Mouth (if applicable) 66 cm 54/01/5034  8:00 PM  Site Assessment Clean;Dry;Intact 07/29/2018  8:00 PM  Ongoing Placement Verification Other (Comment) 07/29/2018  8:00 PM  Status Clamped 07/29/2018  8:00 PM     Urethral Catheter [name] Double-lumen;Latex 16 Fr. (Active)  Indication for Insertion or Continuance of Catheter  Unstable critical patients (first 24-48 hours) 07/29/2018  8:00 PM  Site Assessment Clean;Intact 07/29/2018  8:00 PM  Catheter Maintenance Bag below level of bladder;Drainage bag/tubing not touching floor 07/29/2018  8:00 PM  Collection Container Standard drainage bag 07/29/2018  8:00 PM  Securement Method Securing device (Describe) 07/29/2018  8:00 PM  Urinary Catheter Interventions Unclamped 07/29/2018  8:00 PM  Output (mL) 155 mL 07/29/2018 10:25 PM     Ureteral Drain/Stent Left ureter 6 Fr. (Active)    Review of Systems: Could not be obtained due to the patient being sedated and on mechanical ventilation. BP 135/72   Pulse 91   Temp 98.6 F (37 C) (Oral)   Resp 18   Ht 5\' 6"  (1.676 m)   Wt 69.3 kg   SpO2 98%   BMI 24.66 kg/m He is synchronous with the ventilator.  PHYSICAL EXAMINATION: Vital signs, I's and O's, nursing notes, reviewed. GENERAL: Chronically ill appearing, intubated, mechanically ventilated.  Good cough.   HEAD: Normocephalic, atraumatic.  EYES: Pupils equal, round, reactive to light.  No scleral icterus.  MOUTH: Moist mucosal membranes. NECK: Supple. No thyromegaly. No nodules. No JVD.  PULMONARY: Coarse breath sounds throughout.  No wheezes or rhonchi noted. CARDIOVASCULAR: S1 and S2. Regular rate and rhythm.  There is a loud grade 3/6 crecendo/decrescendo systolic ejection murmur at the right upper sternal border radiation to carotids.   GASTROINTESTINAL: Soft, nontender, non-distended. No masses. Positive bowel sounds. No hepatosplenomegaly.  OG tube in place  MUSCULOSKELETAL: No swelling, clubbing, or edema.  NEUROLOGIC: Sedated, will open eyes, following one-step commands more consistently. SKIN:intact,warm,dry  Indwelling Urinary Catheter continued, requirement due to   Reason to continue Indwelling Urinary Catheter for strict Intake/Output monitoring for hemodynamic instability   Central Line no central line   Reason to continue Central Line N/A    Ventilator continued, requirement due to, resp failure, failure of SBT, tolerating SBT today.    Ventilator Sedation RASS 0     ASSESSMENT AND PLAN SYNOPSIS   1.  Ventilator dependent respiratory failure/hypoxemic. Most likely aspiration in the setting encephalopathy.    He is presently tolerating mechanical ventilation, SBT started this morning and he is tolerating so far, mental status improved.  All sedatives off.  Plan for extubation today.  2.  Toxic metabolic encephalopathy with negative imaging studies and lumbar puncture, evaluated by neurology.  Nothing further to do.  3.  Hyperglycemia.  On sliding scale insulin coverage, discontinue steroids as not indicated.  4.  Hypophosphatemia.  Replaced  5.  History of seizure disorder.  Patient is on Lamictal  6.  Leukocytosis, resolved.  Most likely secondary to pneumonia on empiric broad-spectrum coverage steroids discontinued which may have been aggravating this issue.  He has had fever, however fever and leukocytosis resolving and overall does not appear septic nor toxic.  He is off antibiotics.  7.  Acute renal failure: BUN is 26 creatinine 1.11, potassium 3.3, will replete. Most likely  due to ATN.  Hypernatremia improved at 139  8.  Cardiac murmur consistent with aortic stenosis, 2D echo showed trileaflet aortic valve with calcifications but no significant stenosis.  Murmur is unchanged today.  DVT/GI prophylaxis in place MONITOR FSBS ASSESS the need for LABS as needed  Medication Administration Record has been reviewed in detail.  Multidisciplinary rounds have been performed with Pharm.D., RT, RD, case management, and bedside nurse.  Patient's daughter at bedside and was apprised.  Critical Care Time devoted to patient care services described in this note is 35 minutes Overall, patient's prognosis is guarded.  Patient with multiorgan failure and at high risk for cardiac arrest and death.  Multidisciplinary rounds  were performed as noted above.   Renold Don, MD [facility] Pulmonary/Critical Care Medicine and Advanced Bronchoscopy

## 2018-08-02 NOTE — Progress Notes (Signed)
Patient continues to be on vent and is still on Precedex. Patient not following commands even when off sedation. Fentanyl was restarted due to patient becoming restless and coughing and gagging against vent. CBG's continue to rise; NP aware and new orders have been placed to manage blood glucose. Patient has significant urine output. Will continue to monitor and assess.

## 2018-08-03 ENCOUNTER — Inpatient Hospital Stay: Payer: PPO

## 2018-08-03 DIAGNOSIS — J690 Pneumonitis due to inhalation of food and vomit: Secondary | ICD-10-CM

## 2018-08-03 LAB — COMPREHENSIVE METABOLIC PANEL
ALT: 21 U/L (ref 0–44)
AST: 22 U/L (ref 15–41)
Albumin: 2.5 g/dL — ABNORMAL LOW (ref 3.5–5.0)
Alkaline Phosphatase: 100 U/L (ref 38–126)
Anion gap: 9 (ref 5–15)
BILIRUBIN TOTAL: 0.8 mg/dL (ref 0.3–1.2)
BUN: 22 mg/dL (ref 8–23)
CALCIUM: 7.9 mg/dL — AB (ref 8.9–10.3)
CO2: 27 mmol/L (ref 22–32)
Chloride: 106 mmol/L (ref 98–111)
Creatinine, Ser: 0.94 mg/dL (ref 0.61–1.24)
GFR calc Af Amer: >60 mL/min (ref >60)
GFR calc non Af Amer: >60 mL/min (ref >60)
Glucose, Bld: 228 mg/dL — ABNORMAL HIGH (ref 70–99)
Potassium: 3.5 mmol/L (ref 3.5–5.1)
Sodium: 142 mmol/L (ref 135–145)
Total Protein: 6.1 g/dL — ABNORMAL LOW (ref 6.5–8.1)

## 2018-08-03 LAB — CULTURE, BLOOD (ROUTINE X 2)
Culture: NO GROWTH
Culture: NO GROWTH
SPECIAL REQUESTS: ADEQUATE
Special Requests: ADEQUATE

## 2018-08-03 LAB — MAGNESIUM: Magnesium: 2 mg/dL (ref 1.7–2.4)

## 2018-08-03 LAB — GLUCOSE, CAPILLARY
Glucose-Capillary: 199 mg/dL — ABNORMAL HIGH (ref 70–99)
Glucose-Capillary: 194 mg/dL — ABNORMAL HIGH (ref 70–99)
Glucose-Capillary: 229 mg/dL — ABNORMAL HIGH (ref 70–99)
Glucose-Capillary: 224 mg/dL — ABNORMAL HIGH (ref 70–99)
Glucose-Capillary: 257 mg/dL — ABNORMAL HIGH (ref 70–99)
Glucose-Capillary: 202 mg/dL — ABNORMAL HIGH (ref 70–99)

## 2018-08-03 LAB — BRAIN NATRIURETIC PEPTIDE: B Natriuretic Peptide: 95 pg/mL (ref 0.0–100.0)

## 2018-08-03 LAB — PHOSPHORUS: Phosphorus: 2.4 mg/dL — ABNORMAL LOW (ref 2.5–4.6)

## 2018-08-03 MED ORDER — METRONIDAZOLE IN NACL 5-0.79 MG/ML-% IV SOLN
500.0000 mg | Freq: Three times a day (TID) | INTRAVENOUS | Status: AC
  Administered 2018-08-03 – 2018-08-05 (×6): 500 mg via INTRAVENOUS
  Filled 2018-08-03 (×6): qty 100

## 2018-08-03 MED ORDER — OSMOLITE 1.5 CAL PO LIQD
1000.0000 mL | ORAL | Status: DC
  Administered 2018-08-06 – 2018-08-08 (×3): 1000 mL

## 2018-08-03 MED ORDER — PRO-STAT SUGAR FREE PO LIQD
30.0000 mL | Freq: Every day | ORAL | Status: DC
  Administered 2018-08-05 – 2018-08-07 (×3): 30 mL

## 2018-08-03 MED ORDER — FREE WATER
180.0000 mL | Status: DC
  Administered 2018-08-04 – 2018-08-07 (×21): 180 mL

## 2018-08-03 MED ORDER — METRONIDAZOLE 500 MG PO TABS
500.0000 mg | ORAL_TABLET | Freq: Three times a day (TID) | ORAL | Status: DC
  Filled 2018-08-03 (×3): qty 1

## 2018-08-03 NOTE — Progress Notes (Signed)
[place] [facility] [place] NAME: Maurice Patterson    MR#:  [id number]  DATE OF BIRTH:  08-Aug-1956  SUBJECTIVE:  CHIEF COMPLAINT:  Patient came in with altered mental status and got intubated for possible aspiration pneumonia Got extubated  Patient is lethargic  REVIEW OF SYSTEMS:  Review of system unobtainable as the patient is still lethargic DRUG ALLERGIES:   Allergies  Allergen Reactions  . Hydrocodone Anaphylaxis  . Morphine Other (See Comments)    Loss of memory  . Ambien [Zolpidem] Other (See Comments)    delirium    . Brilinta [Ticagrelor] Other (See Comments)    Stroke   . Flexeril [Cyclobenzaprine] Other (See Comments)    delerium   . Flunitrazepam Other (See Comments)    ROHYPNOL (hallucinations)  . Haldol [Haloperidol Lactate] Other (See Comments)    delerium   . Levetiracetam Diarrhea and Other (See Comments)    Unable to walk  . Lorazepam Hives  . Risperdal [Risperidone] Other (See Comments)    Delirium   . Trazodone Other (See Comments)    Delirium Can take in low doses   . Benadryl [Diphenhydramine Hcl (Sleep)] Rash  . Penicillins Rash    Mouth ulcers Has patient had a PCN reaction causing immediate rash, facial/tongue/throat swelling, SOB or lightheadedness with hypotension: Yes Has patient had a PCN reaction causing severe rash involving mucus membranes or skin necrosis: No Has patient had a PCN reaction that required hospitalization No Has patient had a PCN reaction occurring within the last 10 years: Yes If all of the above answers are "NO", then may proceed with Cephalosporin use.    VITALS:  Blood pressure 131/72, pulse (!) 109, temperature 98.9 F (37.2 C), resp. rate (!) 21, height 5\' 6"  (1.676 m), weight 63.2 kg, SpO2 97 %.  PHYSICAL EXAMINATION:  Physical Exam  GENERAL:  62 y.o.-year-old patient lying in the bed with no acute distress.  EYES: Pupils equal, round, reactive to light and  accommodation. No scleral icterus. HEENT: Head atraumatic, normocephalic. Oropharynx and nasopharynx clear.  NECK:  Supple, no jugular venous distention. No thyroid enlargement, no tenderness.  LUNGS: Normal breath sounds bilaterally, increased rate, no wheezing, some crepitation.  CARDIOVASCULAR: S1, S2 normal.  Systolic murmur is present, no rubs, or gallops.  ABDOMEN: Soft, nontender, nondistended. Bowel sounds present.  EXTREMITIES: No pedal edema, cyanosis, or clubbing.  NEUROLOGIC: stiffer left upper extremity, opening eyes to tactile stimulation  PSYCHIATRIC:   Not following commands.   SKIN: No obvious rash, lesion, or ulcer.    LABORATORY PANEL:   CBC Recent Labs  Lab 08/02/18 0421  WBC 7.9  HGB 10.5*  HCT 33.4*  PLT 222   ------------------------------------------------------------------------------------------------------------------  Chemistries  Recent Labs  Lab 08/03/18 0431  NA 142  K 3.5  CL 106  CO2 27  GLUCOSE 228*  BUN 22  CREATININE 0.94  CALCIUM 7.9*  MG 2.0  AST 22  ALT 21  ALKPHOS 100  BILITOT 0.8   ------------------------------------------------------------------------------------------------------------------  Cardiac Enzymes No results for input(s): TROPONINI in the last 168 hours. ------------------------------------------------------------------------------------------------------------------  RADIOLOGY:  Dg Abd 1 View  Result Date: 08/03/2018 CLINICAL DATA:  NG tube placement. EXAM: ABDOMEN - 1 VIEW COMPARISON:  07/29/2018. FINDINGS: NG tube now noted with tip projected over the right lung base most likely in a right lower lobe bronchus. No bowel distention. No free air. IMPRESSION: NG tube now noted with tip projected over the right lung base most  likely in the right lower lobe bronchus. Critical Value/emergent results were called by telephone at the time of interpretation on 08/03/2018 at 11:34 am to nurse [place], who verbally  acknowledged these results. Electronically Signed   By: Marcello Moores  Register   On: 08/03/2018 11:35    EKG:   Orders placed or performed during the hospital encounter of 07/22/18  . EKG 12-Lead  . EKG 12-Lead  . EKG 12-Lead  . EKG 12-Lead  . ED EKG  . ED EKG    ASSESSMENT AND PLAN:   62 year old male with past medical history significant for CAD, CHF, CKD, dementia, diabetes, hypertension and seizure disorder presents to hospital secondary to worsening weakness and poor appetite  Acute respiratory failure with hypoxemia Possible aspiration pneumonia on broad-spectrum IV antibiotics Got extubated 08/02/2018 Speech therapy evaluation today patient follows verbal commands  -Acute toxic metabolic encephalopathy with past medical history of HSV encephalitis CT head is negative LP done and no evidence of bacterial meningitis with normal WBC and elevated glucose and protein HSV PCR test is negative and acyclovir discontinued per ID Appreciate ID and neurology recommendations Gram stain is negative.  Cultures on CSF is negative. Respiratory culture collected on 08/03/2018 is pending Initial EEG without seizure activity but generalized slowing.  Repeat EEG - no seizure like activities.   -Aspiration pneumonia- ac respi failure with hypoxia Started on broad-spectrum antibiotics Zosyn for aspiration pneumonia 07/31/2018-100.9 08/01/2018-temperature 102.5 but leukocytosis is trending down ID has recommended to discontinue Zosyn and started patient on cefepime and Flagyl.   repeat blood cultures negative MRSA PCR negative.    --  Hypokalemia- replaced.  -Hyponatremia improved and sodium at 139  -  Diabetes mellitus on insulin sliding scale, discontinued insulin drip.  -  History of seizure disorder/myoclonic jerks-on Lamictal. -According to wife, patient does not have any history of seizures -EEG with generalized slowing, no seizure-like activity.  Empirically Vimpat was added by  neurology  -  DVT prophylaxis-on Lovenox   -. Nutrition- Failed attempt for PICC line initially. Awaiting speech therapy assessment, currently n.p.o.    All the records are reviewed and case discussed with Care Management/Social Workerr. Management plans discussed with the patient, family and they are in agreement.  CODE STATUS: Full Code  TOTAL TIME TAKING CARE OF THIS PATIENT: 35 minutes.   POSSIBLE D/C IN 2 DAYS, DEPENDING ON CLINICAL CONDITION.   [name] M.D on 08/03/2018 at 2:29 PM  Between 7am to 6pm - Pager - [phone]  After 6pm go to [email or web address] - password [email or web address] [facility]  Office  [phone]  CC: Primary care physician; [name], MD

## 2018-08-03 NOTE — Progress Notes (Signed)
Patient is non-verbal. Does follow simple commands. Patient is on 3 liters. Patient is NPO, pending speech evaluation. Foley intact with adequate urine output. Stool softeners and laxatives discontinued.  Patient will be on tele. Report given to [facility]. Called Mrs. Reamer she refused to have patient transferred to 1st floor. Charge nurse notified.

## 2018-08-03 NOTE — Progress Notes (Signed)
CRITICAL CARE NOTE  CC  follow up respiratory failure/ventilator management  SUBJECTIVE Maurice Patterson is an 62 y.o. male  with an underlying past medical history of congestive heart failure, coronary artery disease, chronic renal failure, diabetes, hypertension, hyperlipidemia, gastroesophageal reflux disease, prior history of pancreatic cancer was brought in by EMS with complaints of weakness and progressive altered mental status. She was admitted to the hospital with a diagnosis of community-acquired pneumonia andencephalopathy. Felt to be metabolic in nature, neurology was consulted, negative work-up to include lumbar puncture and brain MRI.Patient developed progressive respiratory failure with presumptive aspiration. He was brought to the intensive care unit and started on BiPAP.  He unfortunately failed BiPAP trial and had to be intubated.   SIGNIFICANT EVENTS    Lines, Airways, Drains:     Airway 8 mm (Active)  Secured at (cm) 25 cm 07/30/2018  2:43 AM  Measured From Lips 07/30/2018  2:43 AM  Secured Location Right 07/30/2018  2:43 AM  Secured By [employer] 07/30/2018  2:43 AM  Tube Holder Repositioned Yes 07/30/2018  2:43 AM  Cuff Pressure (cm H2O) 26 cm H2O 07/29/2018  9:50 PM  Site Condition Dry 07/30/2018  2:43 AM     NG/OG Tube Orogastric 16 Fr. Left mouth Xray Documented cm marking at nare/ corner of mouth 66 cm (Active)  Cm Marking at Nare/Corner of Mouth (if applicable) 66 cm 54/0/0867  8:00 PM  Site Assessment Clean;Dry;Intact 07/29/2018  8:00 PM  Ongoing Placement Verification Other (Comment) 07/29/2018  8:00 PM  Status Clamped 07/29/2018  8:00 PM     Urethral Catheter [name] Double-lumen;Latex 16 Fr. (Active)  Indication for Insertion or Continuance of Catheter Unstable critical patients (first 24-48 hours) 07/29/2018  8:00 PM  Site Assessment Clean;Intact 07/29/2018  8:00 PM  Catheter Maintenance Bag below level of bladder;Drainage bag/tubing not  touching floor 07/29/2018  8:00 PM  Collection Container Standard drainage bag 07/29/2018  8:00 PM  Securement Method Securing device (Describe) 07/29/2018  8:00 PM  Urinary Catheter Interventions Unclamped 07/29/2018  8:00 PM  Output (mL) 155 mL 07/29/2018 10:25 PM     Ureteral Drain/Stent Left ureter 6 Fr. (Active)    Review of Systems: Patient unable to provide due to dementia.   BP 131/72   Pulse (!) 109   Temp 98.9 F (37.2 C)   Resp (!) 21   Ht 5\' 6"  (1.676 m)   Wt 63.2 kg   SpO2 97%   BMI 22.49 kg/m    PHYSICAL EXAMINATION: Vital signs, I's and O's, nursing notes, reviewed. GENERAL: Chronically ill appearing, significant dysphagia.  Good cough.   HEAD: Normocephalic, atraumatic.  EYES: Pupils equal, round, reactive to light.  No scleral icterus.  MOUTH: Moist mucosal membranes. NECK: Supple. No thyromegaly. No nodules. No JVD.  PULMONARY: Coarse breath sounds throughout.  No wheezes or rhonchi noted. CARDIOVASCULAR: S1 and S2. Regular rate and rhythm with frequent extrasystoles.  There is a loud grade 3/6 crecendo/decrescendo systolic ejection murmur at the right upper sternal border radiation to carotids.   GASTROINTESTINAL: Soft, nontender, non-distended. No masses. Positive bowel sounds. No hepatosplenomegaly. MUSCULOSKELETAL: No swelling, clubbing, or edema.  NEUROLOGIC: Occasionally following one-step commands. SKIN:intact,warm,dry      Indwelling Urinary Catheter continued, requirement due to   Reason to continue Indwelling Urinary Catheter for strict Intake/Output monitoring for hemodynamic instability   Central Line no central line   Reason to continue Central Line N/A   Ventilator continued, requirement due to, resp failure,  failure of SBT, tolerating SBT today.    Ventilator Sedation RASS 0     ASSESSMENT AND PLAN SYNOPSIS   1.  Acute hypoxemic respiratory failure, liberated from the ventilator yesterday.  Cause wasmost likely aspiration in the  setting encephalopathy.   Still requiring frequent suctioning by RT.  2.  Toxic metabolic encephalopathy with negative imaging studies and lumbar puncture, evaluated by neurology.  Nothing further to do.  3.  Hyperglycemia.  On sliding scale insulin coverage, better control after steroids discontinued.  4.  Hypophosphatemia.  Replaced  5.  History of seizure disorder.  Patient is on Lamictal  6.  Leukocytosis, resolved.  Most likely secondary to pneumonia on empiric broad-spectrum coverage steroids discontinued which may have been aggravating this issue.  He has been afebrile.  7.  Acute renal failure: Resolving.  8.  Cardiac murmur consistent with aortic stenosis, 2D echo showed trileaflet aortic valve with calcifications but no significant stenosis.  Murmur is unchanged today.  9.  Dysphagia: Nursing has failed to place NG tube for feeding.  Will ask radiology to place postpyloric feeding tube fluoroscopically.  DVT/GI prophylaxis in place MONITOR FSBS ASSESS the need for LABS as needed  Medication Administration Record has been reviewed in detail.  Multidisciplinary rounds have been performed with Pharm.D., RT, RD, case management, and bedside nurse.  Family at bedside today update.    Renold Don, MD [facility] Pulmonary/Critical Care Medicine and Advanced Bronchoscopy

## 2018-08-03 NOTE — Evaluation (Signed)
Clinical/Bedside Swallow Evaluation Patient Details  Name: Maurice Patterson MRN: [id number] Date of Birth: 09/25/1955  Today's Date: 08/03/2018 Time: SLP Start Time (ACUTE ONLY): 4627 SLP Stop Time (ACUTE ONLY): 1022 SLP Time Calculation (min) (ACUTE ONLY): 60 min  Past Medical History:  Past Medical History:  Diagnosis Date  . Acute MI ([place])   . Arthritis   . Back pain   . CAD (coronary artery disease)   . CHF (congestive heart failure) ([place])   . Chronic kidney disease   . Degenerative lumbar disc   . Dementia ([place])   . Diabetes mellitus without complication ([place])   . GERD (gastroesophageal reflux disease)   . Hernia of abdominal cavity   . Hyperlipemia   . Hypertension   . Malignant intraductal papillary mucinous tumor of pancreas ([place])   . Memory loss   . Pancreatitis   . Seizures ([place])    staring spells  . Shingles   . Stroke ([place]) 08/2017  . TIA (transient ischemic attack)    Past Surgical History:  Past Surgical History:  Procedure Laterality Date  . CARDIAC CATHETERIZATION    . CORONARY ANGIOPLASTY    . CYSTOSCOPY WITH STENT PLACEMENT Right 05/13/2017   Procedure: CYSTOSCOPY WITH STENT PLACEMENT;  Surgeon: Nickie Retort, MD;  Location: [facility] ORS;  Service: Urology;  Laterality: Right;  . ERCP N/A 09/12/2015   Procedure: ENDOSCOPIC RETROGRADE CHOLANGIOPANCREATOGRAPHY (ERCP);  Surgeon: Hulen Luster, MD;  Location: [facility] ENDOSCOPY;  Service: Gastroenterology;  Laterality: N/A;  . ERCP N/A 01/02/2016   Procedure: ENDOSCOPIC RETROGRADE CHOLANGIOPANCREATOGRAPHY (ERCP);  Surgeon: Hulen Luster, MD;  Location: [facility] ENDOSCOPY;  Service: Gastroenterology;  Laterality: N/A;  . EXTERNAL FIXATION WRIST FRACTURE Left   . left arm metal plate    . LEFT HEART CATH AND CORONARY ANGIOGRAPHY Left 11/04/2016   Procedure: Left Heart Cath and Coronary Angiography;  Surgeon: Isaias Cowman, MD;  Location: [facility] CV LAB;  Service: Cardiovascular;  Laterality: Left;  . Left  shoulder surgery    . Stent times 3  2013   Cardiac  . TOTAL ELBOW REPLACEMENT Left   . VASECTOMY    . VENTRICULOPERITONEAL SHUNT     HPI:  Pt is a 62 y.o male with past medical history of Alzheimer's Dementia, CAD, hypertension, seizure disorder on Lamictal 100 mg twice daily, chronic pancreatitis, herpes simplex encephalitis, diabetes, CVA, MI, hyperlipidemia, and Alzheimer's type dementia presenting to the ED 07/22/2018 with 2 days of progressive lethargy and weakness.  Patient is altered and unable to provide history so most of the history obtained from patient's chart.  Per ED reports patient's wife report that prior to this episode patient has been able to care for himself and ambulate around the house without assistance.  However for the past 2 days patient has been increasingly lethargic, unable to walk or care for himself which is not normal for him.  She was concerned that patient might have had a stroke therefore brought him to the ED. On arrival to the ED, initial CT scan showed no acute intracranial abnormality.  Labs revealed slightly elevated ammonia of 38, potassium of 2.8, normal white count blood cultures pending.  X-ray showed suspected subtle infiltrate in the right upper lung and left base.  He had a follow-up MRI of the brain which did not show acute intracranial abnormality.  Patient has been evaluated in the past (2017) with similar presentation at [facility]. He had extensive work up including LP done on 11/03/2015 with reassuring  cell count and negative CSF HSV, VZV, and culture. Pt's presentation has been felt to be metabolic in nature, neurology was consulted, negative work-up to include lumbar puncture and brain MRI.  Due to pt's declining respiratory status during admission, he has been placed on BiPAP then orally intubated ~12/8-12/11.  Pt is now extubated but continues to have thick sounding secretions during an involuntary cough.  He has a declined Cognitive status w/ reduced  alertness to task of po's; he responded only to mod verbal/tactile stim by SLP during this session. Noted Left pupil appeared larger than the Right pupil. He was nonverbal w/ open-mouth posture. NSG present.    Assessment / Plan / Recommendation Clinical Impression  Pt appears to present w/ Oropharyngeal phase dysphagia impacted by Severely declined Cognitive/Mental status at this time; pt does have a baseline of Dementia. Per MD notes, pt is felt to have metabolic encephalopathy. He is poorly alert and attentive to task of po intake or oral stimulation. He responded reflexively to Mod oral stimulation w/ tongue blade w/ Gag reflex present. Also noted lingual bunching and adequate strength in response to continued stimulation w/ tongue blade. When given single ice chip trials, pt appeared to search for the chip but could not coordinated adequate labial closure and lingual management to control the bolus. Quickly, the few oral movements stopped and pt allowed the ice chip to lay orally - this occurred fairly similarly in 3 different trial attempts. No immediate pharyngeal swallow appreciated. Pt did exhibited a delayed MUCH CONGESTED cough x1/3 trials. Due to pt's overall medical and Cognitive presentation, he is deemed at High risk for aspiration and is recommended to remain NPO at this time. Recommend frequent oral care for hygiene and stimulation of swallowing. ST services will continue to f/u w/ pt's status and further po trials when medical/cognitive status' improve. NSG/MD updated.  SLP Visit Diagnosis: Dysphagia, oropharyngeal phase (R13.12)    Aspiration Risk  Moderate aspiration risk;Severe aspiration risk;Risk for inadequate nutrition/hydration    Diet Recommendation  NPO  Medication Administration: Via alternative means    Other  Recommendations Recommended Consults: (Dietician f/u; Palliative Care f/u) Oral Care Recommendations: Oral care QID;Staff/trained caregiver to provide oral  care(aspiration precautinos) Other Recommendations: (TBD)   Follow up Recommendations (TBD)      Frequency and Duration min 3x week  2 weeks       Prognosis Prognosis for Safe Diet Advancement: Guarded Barriers to Reach Goals: Cognitive deficits;Severity of deficits      Swallow Study   General Date of Onset: 07/23/18 HPI: Pt is a 62 y.o male with past medical history of Alzheimer's Dementia, CAD, hypertension, seizure disorder on Lamictal 100 mg twice daily, chronic pancreatitis, herpes simplex encephalitis, diabetes, CVA, MI, hyperlipidemia, and Alzheimer's type dementia presenting to the ED 07/22/2018 with 2 days of progressive lethargy and weakness.  Patient is altered and unable to provide history so most of the history obtained from patient's chart.  Per ED reports patient's wife report that prior to this episode patient has been able to care for himself and ambulate around the house without assistance.  However for the past 2 days patient has been increasingly lethargic, unable to walk or care for himself which is not normal for him.  She was concerned that patient might have had a stroke therefore brought him to the ED. On arrival to the ED, initial CT scan showed no acute intracranial abnormality.  Labs revealed slightly elevated ammonia of 38, potassium of 2.8, normal  white count blood cultures pending.  X-ray showed suspected subtle infiltrate in the right upper lung and left base.  He had a follow-up MRI of the brain which did not show acute intracranial abnormality.  Patient has been evaluated in the past (2017) with similar presentation at [facility]. He had extensive work up including LP done on 11/03/2015 with reassuring cell count and negative CSF HSV, VZV, and culture. Pt's presentation has been felt to be metabolic in nature, neurology was consulted, negative work-up to include lumbar puncture and brain MRI.  Due to pt's declining respiratory status during admission, he has been placed  on BiPAP then orally intubated ~12/8-12/11.  Pt is now extubated but continues to have thick sounding secretions during an involuntary cough.  He has a declined Cognitive status w/ reduced alertness to task of po's; he responded only to mod verbal/tactile stim by SLP during this session. Noted Left pupil appeared larger than the Right pupil. He was nonverbal w/ open-mouth posture. NSG present.  Type of Study: Bedside Swallow Evaluation Previous Swallow Assessment: none reported Diet Prior to this Study: NPO Temperature Spikes Noted: No(wbc 7.9) Respiratory Status: Nasal cannula(2-3 liters) History of Recent Intubation: Yes Length of Intubations (days): 4 days Date extubated: 08/02/18 Behavior/Cognition: Confused;Doesn't follow directions(Awake; nonverbal) Oral Cavity Assessment: Dry(lips dry ) Oral Care Completed by SLP: Yes Oral Cavity - Dentition: Edentulous Vision: (n/a) Self-Feeding Abilities: Total assist Patient Positioning: Upright in bed(needed positioning) Baseline Vocal Quality: (nonverbal) Volitional Cough: Cognitively unable to elicit Volitional Swallow: Unable to elicit    Oral/Motor/Sensory Function Overall Oral Motor/Sensory Function: Moderate impairment(unable to fully assess d/t Cognition) Lingual ROM: Within Functional Limits Lingual Symmetry: Within Functional Limits Lingual Strength: Within Functional Limits Lingual Sensation: Within Functional Limits   Ice Chips Ice chips: Impaired Presentation: Spoon(fed; 3 trials) Oral Phase Impairments: Reduced labial seal;Reduced lingual movement/coordination;Poor awareness of bolus(allowed ice chips to lay in his mouth w/ min attention to) Oral Phase Functional Implications: Oral holding;Prolonged oral transit Pharyngeal Phase Impairments: (no immediate pharyngeal swallow appreciated) Other Comments: congested cough followed (~5 sec delay) x1/3 trials - quite congested and thick sounding   Thin Liquid Thin Liquid: Not tested     Nectar Thick Nectar Thick Liquid: Not tested   Honey Thick Honey Thick Liquid: Not tested   Puree Puree: Not tested   Solid     Solid: Not tested       Orinda Kenner, MS, CCC-SLP Valecia Beske 08/03/2018,11:48 AM

## 2018-08-03 NOTE — Progress Notes (Signed)
Per IR unable to place dob-hoff. Patient brought back to unit. Dr Mortimer Fries notified.

## 2018-08-03 NOTE — Progress Notes (Signed)
[place] INFECTIOUS DISEASE PROGRESS NOTE Date of Admission:  07/22/2018     ID: Maurice Patterson is a 62 y.o. male with pneumonia and AMS  Principal Problem:   CAP (community acquired pneumonia) Active Problems:   Uncontrolled type 2 diabetes mellitus with hyperglycemia, with long-term current use of insulin ([place])   Essential hypertension   Alzheimer's dementia ([place])   GERD (gastroesophageal reflux disease)   Fever   Acute respiratory failure ([place])  Subjective: Remains extubated  ROS  Unable to obtain  Medications:  Antibiotics Given (last 72 hours)    Date/Time Action Medication Dose Rate   07/31/18 1532 New Bag/Given   piperacillin-tazobactam (ZOSYN) IVPB 3.375 g 3.375 g 12.5 mL/hr   07/31/18 2229 New Bag/Given   piperacillin-tazobactam (ZOSYN) IVPB 3.375 g 3.375 g 12.5 mL/hr   08/01/18 0517 New Bag/Given   piperacillin-tazobactam (ZOSYN) IVPB 3.375 g 3.375 g 12.5 mL/hr   08/01/18 1332 New Bag/Given   ceFEPIme (MAXIPIME) 2 g in sodium chloride 0.9 % 100 mL IVPB 2 g 200 mL/hr   08/01/18 1838 Given   metroNIDAZOLE (FLAGYL) tablet 500 mg 500 mg    08/01/18 2122 New Bag/Given   ceFEPIme (MAXIPIME) 2 g in sodium chloride 0.9 % 100 mL IVPB 2 g 200 mL/hr   08/01/18 2130 Given   metroNIDAZOLE (FLAGYL) tablet 500 mg 500 mg    08/02/18 8413 Given   metroNIDAZOLE (FLAGYL) tablet 500 mg 500 mg    08/02/18 0956 New Bag/Given   ceFEPIme (MAXIPIME) 2 g in sodium chloride 0.9 % 100 mL IVPB 2 g 200 mL/hr   08/02/18 2147 New Bag/Given   ceFEPIme (MAXIPIME) 2 g in sodium chloride 0.9 % 100 mL IVPB 2 g 200 mL/hr   08/03/18 0901 New Bag/Given   ceFEPIme (MAXIPIME) 2 g in sodium chloride 0.9 % 100 mL IVPB 2 g 200 mL/hr   08/03/18 1310 New Bag/Given   metroNIDAZOLE (FLAGYL) IVPB 500 mg 500 mg 100 mL/hr     . aspirin  81 mg Oral Daily  . chlorhexidine gluconate (MEDLINE KIT)  15 mL Mouth Rinse BID  . donepezil  10 mg Oral BID  . enoxaparin (LOVENOX) injection  40 mg Subcutaneous  Q24H  . feeding supplement (PRO-STAT SUGAR FREE 64)  30 mL Per Tube Daily  . free water  180 mL Per Tube Q4H  . insulin aspart  0-15 Units Subcutaneous Q4H  . ipratropium-albuterol  3 mL Nebulization Q4H  . lamoTRIgine  100 mg Oral BID  . lidocaine  1 patch Transdermal Q24H  . mouth rinse  15 mL Mouth Rinse 10 times per day  . memantine  10 mg Oral BID  . metoprolol tartrate  25 mg Per Tube BID  . pantoprazole (PROTONIX) IV  40 mg Intravenous QHS  . sodium chloride flush  10-40 mL Intracatheter Q12H  . thiamine  100 mg Oral Daily    Objective: Vital signs in last 24 hours: Temp:  [96.7 F (35.9 C)-99.2 F (37.3 C)] 98.9 F (37.2 C) (12/12 1200) Pulse Rate:  [81-123] 109 (12/12 1300) Resp:  [11-28] 21 (12/12 1300) BP: (104-194)/(61-137) 131/72 (12/12 1300) SpO2:  [93 %-99 %] 97 % (12/12 1300) Weight:  [63.2 kg] 63.2 kg (12/12 0434) Physical Exam  Constitutional: extubated, lethargic HENT: aniceric,  reactive Mouth/Throat: MM dry Cardiovascular: Normal rate, regular rhythm and normal heart sounds.  Pulmonary/Chest: bil rhonchi and poor air movement Abdominal: Soft. Bowel sounds are normal. He exhibits no distension. There is no tenderness.  Lymphadenopathy: He has no cervical adenopathy.  Neurological: off sedation but remains very lethargic R groin TLC Skin: Skin is warm and dry. No rash noted. No erythema.  Psychiatric: min responsive Ext trace edema bil  Lab Results Recent Labs    08/01/18 0514 08/02/18 0421 08/03/18 0431  WBC 10.3 7.9  --   HGB 10.3* 10.5*  --   HCT 33.0* 33.4*  --   NA 150* 139 142  K 4.3 3.3* 3.5  CL 120* 105 106  CO2 _0 BUN 32* 26* 22  CREATININE 1.39* 1.11 0.94    Microbiology: Results for orders placed or performed during the hospital encounter of 07/22/18  Urine culture     Status: None   Collection Time: 07/22/18 11:03 PM  Result Value Ref Range Status   Specimen Description   Final    URINE, RANDOM Performed at [facility], [address], [place], [place] [address]    Special Requests   Final    NONE Performed at [facility], [address], [place], [place] [address]    Culture   Final    NO GROWTH Performed at [facility], [place] [address], [place], [place] [address]    Report Status 07/24/2018 FINAL  Final  CULTURE, BLOOD (ROUTINE X 2) w Reflex to ID Panel     Status: None   Collection Time: 07/23/18 10:06 AM  Result Value Ref Range Status   Specimen Description BLOOD BLOOD LEFT ARM  Final   Special Requests   Final    BOTTLES DRAWN AEROBIC AND ANAEROBIC Blood Culture adequate volume   Culture   Final    NO GROWTH 5 DAYS Performed at [facility], [address] [place], [place] [address]    Report Status 07/28/2018 FINAL  Final  CULTURE, BLOOD (ROUTINE X 2) w Reflex to ID Panel     Status: None   Collection Time: 07/23/18 10:17 AM  Result Value Ref Range Status   Specimen Description BLOOD BLOOD LEFT WRIST  Final   Special Requests   Final    BOTTLES DRAWN AEROBIC AND ANAEROBIC Blood Culture adequate volume   Culture   Final    NO GROWTH 5 DAYS Performed at [facility], [address], [place], [place] [address]    Report Status 07/28/2018 FINAL  Final  Gram stain     Status: None   Collection Time: 07/26/18  3:11 PM  Result Value Ref Range Status   Specimen Description CSF  Final   Special Requests NONE  Final   Gram Stain   Final    NO ORGANISMS SEEN WBC SEEN Performed at [facility], [address] [place], [place] [address]    Report Status 07/26/2018 FINAL  Final  CSF culture     Status: None   Collection Time: 07/26/18  3:11 PM  Result Value Ref Range Status   Specimen Description   Final    CSF Performed at [facility], [address] [place], [place] [address]    Special Requests   Final    NONE Performed at [facility], [address] [place],  [address]    Gram Stain    Final    NO ORGANISMS SEEN WBC SEEN NO RBC SEEN Performed at [facility], [address], [place],  [address]    Culture   Final    NO GROWTH 3 DAYS Performed at [facility], [place] [address], [place], [place] [address]    Report Status 07/30/2018 FINAL  Final  Fungus Culture With Stain     Status: None (Preliminary result)   Collection Time: 07/26/18  3:11 PM  Result Value Ref Range Status   Fungus Stain Final report  Final    Comment: (NOTE) Performed At: [facility] [place], [place] [id number] Rush Farmer MD [id number]    Fungus (Mycology) Culture PENDING  Incomplete   Fungal Source CSF  Final    Comment: Performed at [facility], [address] [place], [place] [address]  Fungus Culture Result     Status: None   Collection Time: 07/26/18  3:11 PM  Result Value Ref Range Status   Result 1 Comment  Final    Comment: (NOTE) KOH/Calcofluor preparation:  no fungus observed. Performed At: [facility] [place], [place] [id number] Rush Farmer MD YW:[id number]   CULTURE, BLOOD (ROUTINE X 2) w Reflex to ID Panel     Status: None   Collection Time: 07/29/18  6:14 AM  Result Value Ref Range Status   Specimen Description BLOOD LEFT FOREARM  Final   Special Requests   Final    BOTTLES DRAWN AEROBIC AND ANAEROBIC Blood Culture adequate volume   Culture   Final    NO GROWTH 5 DAYS Performed at [facility], [address] [place], [place] [address]    Report Status 08/03/2018 FINAL  Final  CULTURE, BLOOD (ROUTINE X 2) w Reflex to ID Panel     Status: None   Collection Time: 07/29/18  6:17 AM  Result Value Ref Range Status   Specimen Description BLOOD LEFT HAND  Final   Special Requests   Final    BOTTLES DRAWN AEROBIC AND ANAEROBIC Blood Culture adequate volume   Culture   Final    NO GROWTH 5 DAYS Performed at [facility], [address], [place], [place]  [address]    Report Status 08/03/2018 FINAL  Final  MRSA PCR Screening     Status: None   Collection Time: 07/29/18  4:18 PM  Result Value Ref Range Status   MRSA by PCR NEGATIVE NEGATIVE Final    Comment:        The GeneXpert MRSA Assay (FDA approved for NASAL specimens only), is one component of a comprehensive MRSA colonization surveillance program. It is not intended to diagnose MRSA infection nor to guide or monitor treatment for MRSA infections. Performed at [facility], [address] [place], [place] [address]   Culture, respiratory     Status: None (Preliminary result)   Collection Time: 08/01/18  3:27 PM  Result Value Ref Range Status   Specimen Description   Final    TRACHEAL ASPIRATE Performed at [facility], [address], [place], [place] [address]    Special Requests   Final    NONE Performed at [facility], [address] [place], [place] [address]    Gram Stain   Final    MODERATE WBC PRESENT,BOTH PMN AND MONONUCLEAR RARE GRAM POSITIVE COCCI    Culture   Final    CULTURE REINCUBATED FOR BETTER GROWTH Performed at [facility], [place] [address] [place],  [address]    Report Status PENDING  Incomplete    Studies/Results: Dg Abd 1 View  Result Date: 08/03/2018 CLINICAL DATA:  NG tube placement. EXAM: ABDOMEN - 1 VIEW COMPARISON:  07/29/2018.  FINDINGS: NG tube now noted with tip projected over the right lung base most likely in a right lower lobe bronchus. No bowel distention. No free air. IMPRESSION: NG tube now noted with tip projected over the right lung base most likely in the right lower lobe bronchus. Critical Value/emergent results were called by telephone at the time of interpretation on 08/03/2018 at 11:34 am to nurse [place], who verbally acknowledged these results. Electronically Signed   By: Marcello Moores  Register   On: 08/03/2018 11:35    Assessment/Plan: CARTEZ MOGLE is a 62 y.o. male admitted with  confusion, weakness, possible PNA. Marland Kitchen Has had MRI x 2, CT of brain with nothing acute. Seen by neurology. EEG with diffuse slowing. Utox neg, Flu neg, HIV neg, RPR neg, TSH neg, UA negative, abg unimpressive. CSF with very mild elevated wbc and protein.  I do not think he has meningitis or encephalitis and certainly does not have a bacterial meningitis with such a low wbc. He follows with neurology as otpt for transient memory loss and expressive aphasia in past, hx of stroke and seizures.  I suspect current presentation is multifactorial.  12/7 high fever last night, diaphoretic, remains on O2 with increased resp distress today. Remains lethargic 12/8 transferred to unit for resp distress, cxr with bibasilar infiltrates, likely aspiration. wbc elevated.  12/9 temp to 100.9 but HD stable and stable on vent. HSV PCR neg 12/10- febrile again to 102.5 but wbc down  to 10. Echo done and negative. However overall improving, not on pressors, improving on vent settings and wife reports he is waking up more 12/11- Extubated today. no fevers, changed zosyn to cefepime and flagyl. WBC down to 7.8  12/12 no fevers remains extubated. Still lethargic Recommendations. Cont cefepime and flagyl - stop date of 12/13 sputum cx with GPC . Remain off vanco since MRSA PCR negative making MRSA pna very unlikely. ARF is improving.  Cont neuro work up.  Thank you very much for the consult. Will follow with you.  Maurice Patterson   08/03/2018, 1:27 PM

## 2018-08-03 NOTE — Progress Notes (Signed)
Nutrition Follow-up  DOCUMENTATION CODES:   Not applicable  INTERVENTION:  Once NGT placed and confirmed initiate Osmolite 1.5 at 55 mL/hr + Pro-Stat 30 mL once daily per tube. Provides 2080 kcal, 98 grams of protein, 1003 mL H2O daily.  Provide free water flush of 180 mL Q4hrs once tube feeds initiated. Provides a total of 2083 mL H2O daily including water in tube feeding.  Goal TF regimen meets 100% RDIs for vitamins/minerals.  Resume thiamine 100 mg daily per tube once enteral access obtained.  Patient continues to be at risk for refeeding syndrome. Continue monitoring potassium, phosphorus, and magnesium and supplementing as needed.  NUTRITION DIAGNOSIS:   Inadequate oral intake related to (acute metabolic encephalopathy) as evidenced by meal completion < 25%.  Ongoing - addressing with resumption of tube feeds.  GOAL:   Patient will meet greater than or equal to 90% of their needs  Progressing.  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   -Patient was seen by this RD for initial assessment on 12/5. Received consult for TPN initiation. Patient was not an ideal candidate for TPN but patient's family was requesting nutrition be started and team felt he would not keep an NGT in place. TPN was not initiated as PICC line could not be placed. -On evening of 12/7 patient developed progressive respiratory failure with presumptive aspiration. He transferred to ICU and was initially placed on BiPAP but later required intubation. -Patient was extubated on 12/11.  Patient seen resting in bed on nasal cannula 2  L/min. No family members present at time of RD assessment. Per SLP evaluation this AM patient is to remain NPO for now. Plan is for placement of NGT for initiation of tube feeds and for medications. Prior to extubation patient was tolerating previous goal TF regimen of Vital AF 1.2 at 60 mL/hr.  Enteral Access: plan is for placement of NGT today  Medications reviewed and include: Novolog 0-15 units Q4hrs, pantoprazole 40 mg daily IV, thiamine 100 mg daily, cefepime.  Labs reviewed: CBG 174-229, Phosphorus 2.4.  I/O: 3325 mL UOP yesterday (2.2 mL/kg/hr); 2 BMs yesterday  Weight trend: 63.2 kg on 12/12; -14.4 kg from admission  Discussed with RN and on rounds. NGT being placed today to resume enteral nutrition.  Diet Order:   Diet Order            Diet NPO time specified  Diet effective now             EDUCATION NEEDS:   Not appropriate for education at this time  Skin:  Skin Assessment: Reviewed RN Assessment  Last BM:  07/27/2018 - large type 7  Height:   Ht Readings from Last 1 Encounters:  07/29/18 5\' 6"  (1.676 m)   Weight:   Wt Readings from Last 1 Encounters:  08/03/18 63.2 kg   Ideal Body Weight:  70 kg  BMI:  Body mass index is 22.49 kg/m.  Estimated Nutritional Needs:   Kcal:  1845-2150 (MSJ x 1.2-1.4)  Protein:  85-105 grams (1.2-1.5 grams/kg)  Fluid:  1.8-2.1 L/day (1 mL/kcal)  Willey Blade, MS, RD, LDN Office: [phone] Pager: [phone]  After Hours/Weekend Pager: [phone]

## 2018-08-03 NOTE — Progress Notes (Signed)
   08/03/18 1000  Clinical Encounter Type  Visited With Patient not available (Patient was sleeping/Receiving Care)   Chaplain provided energetic prayer and presence as the patient slept, and later received care.

## 2018-08-03 NOTE — Progress Notes (Signed)
Pharmacy Antibiotic Note  Maurice Patterson is a 62 y.o. male admitted on 07/22/2018 with rule out meningitis.  Patient has been receiving Zosyn for aspiration PNA. Patient spiked fever of  102.5 on 12/10. Pharmacy now consulted for cefepime dosing. Patient is also ordered metronidazole.  Zosyn discontinued since possible drug fever.   Patient has a penicillin allergy but tolerated ampicillin on this admission and Zosyn.  HSV 1/2 DNA  Negative. Acyclovir Dc'd by ID on 12/9  Plan: Continue cefepime 2g IV every 12 hours. Stop date per ID 12/13.  Height: 5\' 6"  (167.6 cm) Weight: 139 lb 5.3 oz (63.2 kg) IBW/kg (Calculated) : 63.8  Temp (24hrs), Avg:98.3 F (36.8 C), Min:96.7 F (35.9 C), Max:99.2 F (37.3 C)  Recent Labs  Lab 07/28/18 0519  07/30/18 0501 07/31/18 0528 08/01/18 0514 08/02/18 0421 08/03/18 0431  WBC 10.5  --  19.4* 18.6* 10.3 7.9  --   CREATININE 0.57*   < > 1.60* 1.59* 1.39* 1.11 0.94   < > = values in this interval not displayed.    Estimated Creatinine Clearance: 73.8 mL/min (by C-G formula based on SCr of 0.94 mg/dL).     Antimicrobials this admission: 12/3 vanco >> 12/5, 12/7 >>12/8 12/3 ceftriaxone >> 12/5 12/3 acyclovir >> 12/9  12/3 ampicillin >> 12/5 12/1 azithro >>12/3 12/7 meropenem >>12/8 12/8 Zosyn >> 12/10 12/10 Cefepime>>    Microbiology results: 12/1 BCx: NG 11/30 UCx: NG 12/2 Influenza: neg 12/3 HSV PCR: negative  12/7 BCx: NGTD MRSA PCR neg  Thank you for allowing pharmacy to be a part of this patient's care.  Pernell Dupre, PharmD, BCPS Clinical Pharmacist 08/03/2018 8:23 AM

## 2018-08-03 NOTE — Progress Notes (Signed)
Per Dr Duwayne Heck she will place order for IR to place tube for feeding. Unable to give patients medication/feeds.

## 2018-08-03 NOTE — Progress Notes (Signed)
Dr. Register called and stated that NG tube was in lung to remove.  RN removed NG tube.  Patient getting Chest PT at this time with cough noted.

## 2018-08-03 NOTE — Progress Notes (Signed)
Patient was nasotracheal suctioned. There was a moderate amount of tan pink tinged secretions. A sample from the collected sputum was sent to the lab.

## 2018-08-03 NOTE — Progress Notes (Signed)
Patient had an uneventful night; maintained on 3L nasal canula, tolerating well and in no distress after extubation yesterday (08/02/09). Patient continues to have very loose bowl movements; NP aware and no new orders placed at this time. Patient's wife visited and has been updated on patient status.

## 2018-08-04 ENCOUNTER — Inpatient Hospital Stay: Payer: PPO

## 2018-08-04 DIAGNOSIS — R410 Disorientation, unspecified: Secondary | ICD-10-CM

## 2018-08-04 LAB — CBC WITH DIFFERENTIAL/PLATELET
Abs Immature Granulocytes: 0.05 10*3/uL (ref 0.00–0.07)
Basophils Absolute: 0 10*3/uL (ref 0.0–0.1)
Basophils Relative: 0 %
Eosinophils Absolute: 0.2 10*3/uL (ref 0.0–0.5)
Eosinophils Relative: 2 %
HCT: 40.6 % (ref 39.0–52.0)
Hemoglobin: 12.9 g/dL — ABNORMAL LOW (ref 13.0–17.0)
IMMATURE GRANULOCYTES: 1 %
Lymphocytes Relative: 17 %
Lymphs Abs: 1.6 10*3/uL (ref 0.7–4.0)
MCH: 28.9 pg (ref 26.0–34.0)
MCHC: 31.8 g/dL (ref 30.0–36.0)
MCV: 90.8 fL (ref 80.0–100.0)
Monocytes Absolute: 1.1 10*3/uL — ABNORMAL HIGH (ref 0.1–1.0)
Monocytes Relative: 11 %
NEUTROS ABS: 6.7 10*3/uL (ref 1.7–7.7)
Neutrophils Relative %: 69 %
Platelets: 327 10*3/uL (ref 150–400)
RBC: 4.47 MIL/uL (ref 4.22–5.81)
RDW: 15.7 % — ABNORMAL HIGH (ref 11.5–15.5)
WBC: 9.6 10*3/uL (ref 4.0–10.5)
nRBC: 0 % (ref 0.0–0.2)

## 2018-08-04 LAB — GLUCOSE, CAPILLARY
Glucose-Capillary: 206 mg/dL — ABNORMAL HIGH (ref 70–99)
Glucose-Capillary: 212 mg/dL — ABNORMAL HIGH (ref 70–99)
Glucose-Capillary: 246 mg/dL — ABNORMAL HIGH (ref 70–99)
Glucose-Capillary: 260 mg/dL — ABNORMAL HIGH (ref 70–99)
Glucose-Capillary: 237 mg/dL — ABNORMAL HIGH (ref 70–99)

## 2018-08-04 LAB — BASIC METABOLIC PANEL
Anion gap: 10 (ref 5–15)
BUN: 27 mg/dL — ABNORMAL HIGH (ref 8–23)
CO2: 27 mmol/L (ref 22–32)
Calcium: 8.3 mg/dL — ABNORMAL LOW (ref 8.9–10.3)
Chloride: 110 mmol/L (ref 98–111)
Creatinine, Ser: 1.08 mg/dL (ref 0.61–1.24)
GFR calc Af Amer: >60 mL/min (ref >60)
GFR calc non Af Amer: >60 mL/min (ref >60)
Glucose, Bld: 201 mg/dL — ABNORMAL HIGH (ref 70–99)
Potassium: 3.5 mmol/L (ref 3.5–5.1)
SODIUM: 147 mmol/L — AB (ref 135–145)

## 2018-08-04 LAB — CULTURE, RESPIRATORY W GRAM STAIN

## 2018-08-04 LAB — MAGNESIUM: Magnesium: 2.4 mg/dL (ref 1.7–2.4)

## 2018-08-04 LAB — PHOSPHORUS: PHOSPHORUS: 2.4 mg/dL — AB (ref 2.5–4.6)

## 2018-08-04 MED ORDER — DEXTROSE-NACL 5-0.45 % IV SOLN
INTRAVENOUS | Status: DC
  Administered 2018-08-04 – 2018-08-06 (×3): via INTRAVENOUS

## 2018-08-04 MED ORDER — ALBUTEROL SULFATE (2.5 MG/3ML) 0.083% IN NEBU: 2.5000 mg | INHALATION_SOLUTION | RESPIRATORY_TRACT | Status: DC | PRN

## 2018-08-04 MED ORDER — ASPIRIN 81 MG PO CHEW
81.0000 mg | CHEWABLE_TABLET | Freq: Every day | ORAL | Status: DC
  Administered 2018-08-04 – 2018-09-08 (×33): 81 mg
  Filled 2018-08-04 (×33): qty 1

## 2018-08-04 MED ORDER — PANTOPRAZOLE SODIUM 40 MG PO PACK
40.0000 mg | PACK | Freq: Every day | ORAL | Status: DC
  Administered 2018-08-04 – 2018-08-13 (×8): 40 mg
  Filled 2018-08-04 (×10): qty 20

## 2018-08-04 MED ORDER — FENTANYL 2500MCG IN NS 250ML (10MCG/ML) PREMIX INFUSION
INTRAVENOUS | Status: AC
  Filled 2018-08-04: qty 250

## 2018-08-04 MED ORDER — IPRATROPIUM-ALBUTEROL 0.5-2.5 (3) MG/3ML IN SOLN
3.0000 mL | Freq: Three times a day (TID) | RESPIRATORY_TRACT | Status: DC
  Administered 2018-08-04 – 2018-08-08 (×11): 3 mL via RESPIRATORY_TRACT
  Filled 2018-08-04 (×11): qty 3

## 2018-08-04 MED ORDER — LAMOTRIGINE 25 MG PO TABS
100.0000 mg | ORAL_TABLET | Freq: Two times a day (BID) | ORAL | Status: DC
  Administered 2018-08-04 – 2018-08-15 (×21): 100 mg
  Filled 2018-08-04: qty 1
  Filled 2018-08-04: qty 4
  Filled 2018-08-04: qty 1
  Filled 2018-08-04 (×2): qty 4
  Filled 2018-08-04: qty 1
  Filled 2018-08-04 (×2): qty 4
  Filled 2018-08-04: qty 1
  Filled 2018-08-04: qty 4
  Filled 2018-08-04: qty 1
  Filled 2018-08-04 (×9): qty 4
  Filled 2018-08-04: qty 1

## 2018-08-04 NOTE — Progress Notes (Addendum)
Inpatient Diabetes Program Recommendations  AACE/ADA: New Consensus Statement on Inpatient Glycemic Control (2015)  Target Ranges:  Prepandial:   less than 140 mg/dL      Peak postprandial:   less than 180 mg/dL (1-2 hours)      Critically ill patients:  140 - 180 mg/dL   Results for Maurice Patterson, Maurice Patterson (MRN [id number]) as of 08/04/2018 12:47  Ref. Range 08/02/2018 23:08 08/03/2018 03:58 08/03/2018 07:30 08/03/2018 11:19 08/03/2018 16:41 08/03/2018 19:22  Glucose-Capillary Latest Ref Range: 70 - 99 mg/dL 174 (H)  3 units NOVOLOG  199 (H)  3 units NOVOLOG  194 (H)  3 units NOVOLOG  229 (H)  5 units NOVOLOG  224 (H)  5 units NOVOLOG   257 (H)  8 units NOVOLOG   Results for Maurice Patterson, Maurice Patterson (MRN [id number]) as of 08/04/2018 12:47  Ref. Range 08/03/2018 23:19 08/04/2018 03:20 08/04/2018 07:28 08/04/2018 12:10  Glucose-Capillary Latest Ref Range: 70 - 99 mg/dL 202 (H)  2 units NOVOLOG 206 (H)  5 units NOVOLOG 212 (H)  5 units NOVOLOG 246 (H)     Home DM Meds:NPH Insulin 32 units BID Regular Insulin 34 units TID with meals Regular Insulin 8 units for every 20 mg/dl >200 mg/dl  Current Orders:Novolog Moderate Correction Scale/ SSI (0-15 units) Q4 hours       MD- Note CBGs consistently in the 200s since yesterday at 11am.  Patient takes NPH Insulin at home for basal insulin coverage.  Please consider starting a portion of home basal insulin (as Levemir since Levemir substituted for NPH):  Levemir 10 units BID (1/3 total home dose)     --Will follow patient during hospitalization--  Wyn Quaker RN, MSN, CDE Diabetes Coordinator Inpatient Glycemic Control Team Team Pager: [phone] (8a-5p)

## 2018-08-04 NOTE — Progress Notes (Signed)
[facility] [place] NAME: Maurice Patterson    MR#:  [id number]  DATE OF BIRTH:  [birth year]  SUBJECTIVE:  CHIEF COMPLAINT:  Patient came in with altered mental status and got intubated for possible aspiration pneumonia Got extubated  Patient is STILL lethargic.  Transfer to floor today.  Son at bedside  REVIEW OF SYSTEMS:  Review of system unobtainable as the patient is still lethargic DRUG ALLERGIES:   Allergies  Allergen Reactions  . Hydrocodone Anaphylaxis  . Morphine Other (See Comments)    Loss of memory  . Ambien [Zolpidem] Other (See Comments)    delirium    . Brilinta [Ticagrelor] Other (See Comments)    Stroke   . Flexeril [Cyclobenzaprine] Other (See Comments)    delerium   . Flunitrazepam Other (See Comments)    ROHYPNOL (hallucinations)  . Haldol [Haloperidol Lactate] Other (See Comments)    delerium   . Levetiracetam Diarrhea and Other (See Comments)    Unable to walk  . Lorazepam Hives  . Risperdal [Risperidone] Other (See Comments)    Delirium   . Trazodone Other (See Comments)    Delirium Can take in low doses   . Benadryl [Diphenhydramine Hcl (Sleep)] Rash  . Penicillins Rash    Mouth ulcers Has patient had a PCN reaction causing immediate rash, facial/tongue/throat swelling, SOB or lightheadedness with hypotension: Yes Has patient had a PCN reaction causing severe rash involving mucus membranes or skin necrosis: No Has patient had a PCN reaction that required hospitalization No Has patient had a PCN reaction occurring within the last 10 years: Yes If all of the above answers are "NO", then may proceed with Cephalosporin use.    VITALS:  Blood pressure (!) 146/83, pulse 97, temperature 99.5 F (37.5 C), temperature source Axillary, resp. rate (!) 24, height 5\' 6"  (1.676 m), weight 63.2 kg, SpO2 97 %.  PHYSICAL EXAMINATION:  Physical Exam  GENERAL:  62 y.o.-year-old patient lying in the bed with  no acute distress.  EYES: Pupils equal, round, reactive to light and accommodation. No scleral icterus. HEENT: Head atraumatic, normocephalic. Oropharynx and nasopharynx clear.  NECK:  Supple, no jugular venous distention. No thyroid enlargement, no tenderness.  LUNGS: Normal breath sounds bilaterally, increased rate, no wheezing, some crepitation.  CARDIOVASCULAR: S1, S2 normal.  Systolic murmur is present, no rubs, or gallops.  ABDOMEN: Soft, nontender, nondistended. Bowel sounds present.  EXTREMITIES: No pedal edema, cyanosis, or clubbing.  NEUROLOGIC: stiffer left upper extremity, opening eyes to tactile stimulation  PSYCHIATRIC:   Not following commands.   SKIN: No obvious rash, lesion, or ulcer.    LABORATORY PANEL:   CBC Recent Labs  Lab 08/04/18 0546  WBC 9.6  HGB 12.9*  HCT 40.6  PLT 327   ------------------------------------------------------------------------------------------------------------------  Chemistries  Recent Labs  Lab 08/03/18 0431 08/04/18 0546  NA 142 147*  K 3.5 3.5  CL 106 110  CO2 27 27  GLUCOSE 228* 201*  BUN 22 27*  CREATININE 0.94 1.08  CALCIUM 7.9* 8.3*  MG 2.0 2.4  AST 22  --   ALT 21  --   ALKPHOS 100  --   BILITOT 0.8  --    ------------------------------------------------------------------------------------------------------------------  Cardiac Enzymes No results for input(s): TROPONINI in the last 168 hours. ------------------------------------------------------------------------------------------------------------------  RADIOLOGY:  Dg Abd 1 View  Result Date: 08/03/2018 CLINICAL DATA:  NG tube placement. EXAM: ABDOMEN - 1 VIEW COMPARISON:  07/29/2018. FINDINGS: NG tube now noted  with tip projected over the right lung base most likely in a right lower lobe bronchus. No bowel distention. No free air. IMPRESSION: NG tube now noted with tip projected over the right lung base most likely in the right lower lobe bronchus.  Critical Value/emergent results were called by telephone at the time of interpretation on 08/03/2018 at 11:34 am to nurse [place], who verbally acknowledged these results. Electronically Signed   By: Mildred   On: 08/03/2018 11:35   Dg Loyce Dys Tube Plc W/fl W/rad  Result Date: 08/04/2018 CLINICAL DATA:  Feeding difficulties. EXAM: NASO G TUBE PLACEMENT WITH FL AND WITH RAD CONTRAST:  None. FLUOROSCOPY TIME:  Fluoroscopy Time:  5 minutes 12 seconds Number of Acquired Spot Images: 1 mm COMPARISON:  Prior study 08/03/2018. FINDINGS: Dobbhoff tube was placed through the right nares into the stomach under fluoroscopic guidance. Double tube tip could not be placed into the duodenum. There no complications. IMPRESSION: Successful Dobbhoff tube placement under fluoroscopic guidance. Electronically Signed   By: Marcello Moores  Register   On: 08/04/2018 11:30    EKG:   Orders placed or performed during the hospital encounter of 07/22/18  . EKG 12-Lead  . EKG 12-Lead  . EKG 12-Lead  . EKG 12-Lead  . ED EKG  . ED EKG    ASSESSMENT AND PLAN:   62 year old male with past medical history significant for CAD, CHF, CKD, dementia, diabetes, hypertension and seizure disorder presents to hospital secondary to worsening weakness and poor appetite  Acute respiratory failure with hypoxemia Possible aspiration pneumonia on broad-spectrum IV antibiotics Got extubated 08/02/2018 Speech therapy evaluation   -Acute toxic metabolic encephalopathy with past medical history of HSV encephalitis CT head is negative LP done and no evidence of bacterial meningitis with normal WBC and elevated glucose and protein HSV PCR test is negative and acyclovir discontinued per ID Appreciate ID and neurology recommendations Gram stain is negative.  Cultures on CSF is negative. Respiratory culture collected on 08/03/2018 revealed rare  gram-positive cocci Initial EEG without seizure activity but generalized slowing.  Repeat  EEG - no seizure like activities.   -Aspiration pneumonia- ac respi failure with hypoxia Started on broad-spectrum antibiotics Zosyn for aspiration pneumonia 07/31/2018-100.9 08/01/2018-temperature 102.5 but leukocytosis is trending down ID has recommended to discontinue Zosyn and started patient on cefepime and Flagyl.   repeat blood cultures negative MRSA PCR negative.    --  Hypokalemia- replaced.  -Hyponatremia improved and sodium at 139  -  Diabetes mellitus on insulin sliding scale, discontinued insulin drip.  -  History of seizure disorder/myoclonic jerks-on Lamictal. -According to wife, patient does not have any history of seizures -EEG with generalized slowing, no seizure-like activity.  Empirically Vimpat was added by neurology  -  DVT prophylaxis-on Lovenox   -. Nutrition- Failed attempt for PICC line initially. Awaiting speech therapy assessment, currently n.p.o.    All the records are reviewed and case discussed with Care Management/Social Workerr. Management plans discussed with the patient, family and they are in agreement.  CODE STATUS: Full Code  TOTAL TIME TAKING CARE OF THIS PATIENT: 35 minutes.   POSSIBLE D/C IN 2 DAYS, DEPENDING ON CLINICAL CONDITION.   [name] M.D on 08/04/2018 at 5:22 PM  Between 7am to 6pm - Pager - [phone]  After 6pm go to [email or web address] - password [email or web address] [facility]  Office  [phone]  CC: Primary care physician; [name], MD

## 2018-08-04 NOTE — Progress Notes (Signed)
Pt transferred to 206 in bed with chart and medications, accompanied by son. Report given to Almyra Free, [place].

## 2018-08-04 NOTE — Progress Notes (Signed)
Speech Language Pathology Treatment: Dysphagia  Patient Details Name: Maurice Patterson MRN: [id number] DOB: [birth year] Today's Date: 08/04/2018 Time: 1320-1400 SLP Time Calculation (min) (ACUTE ONLY): 40 min  Assessment / Plan / Recommendation Clinical Impression  Pt continues to present w/ Oropharyngeal phase dysphagia impacted by Severely declined Cognitive/Mental status at this time; pt does have a baseline of Dementia. Per MD notes, pt is felt to have metabolic encephalopathy. He is poorly alert and attentive to task of po intake or oral stimulation. He responded reflexively to Max oral stimulation w/ sponge swabs - bite reflex noted. Also noted lingual bunching and adequate strength in response to oral stimulation w/ swabs. IR has placed an NG tube for TFs today. When given single ice chip trials, pt allowed ice chip to lay in cheek w/ no labial closure or significant lingual management to control the bolus. Quickly, any oral movements stopped and pt closed eyes. Max verbal/tactile stimulation given to have pt attend to task and ice chip in cheek. No immediate pharyngeal swallow appreciated.  Due to pt's overall medical and Cognitive presentation, he is deemed at High risk for aspiration and is recommended to remain NPO at this time. Recommend frequent oral care for hygiene and stimulation of swallowing. ST services will continue to f/u next week w/ pt's status and further po trials when medical/cognitive status' improve. NSG and Sister updated.      HPI HPI: Pt is a 62 y.o male with past medical history of Alzheimer's Dementia, CAD, hypertension, seizure disorder on Lamictal 100 mg twice daily, chronic pancreatitis, herpes simplex encephalitis, diabetes, CVA, MI, hyperlipidemia, and Alzheimer's type dementia presenting to the ED 07/22/2018 with 2 days of progressive lethargy and weakness.  Patient is altered and unable to provide history so most of the history obtained from patient's chart.   Per ED reports patient's wife report that prior to this episode patient has been able to care for himself and ambulate around the house without assistance.  However for the past 2 days patient has been increasingly lethargic, unable to walk or care for himself which is not normal for him.  She was concerned that patient might have had a stroke therefore brought him to the ED. On arrival to the ED, initial CT scan showed no acute intracranial abnormality.  Labs revealed slightly elevated ammonia of 38, potassium of 2.8, normal white count blood cultures pending.  X-ray showed suspected subtle infiltrate in the right upper lung and left base.  He had a follow-up MRI of the brain which did not show acute intracranial abnormality.  Patient has been evaluated in the past (2017) with similar presentation at [facility]. He had extensive work up including LP done on 11/03/2015 with reassuring cell count and negative CSF HSV, VZV, and culture. Pt's presentation has been felt to be metabolic in nature, neurology was consulted, negative work-up to include lumbar puncture and brain MRI.  Due to pt's declining respiratory status during admission, he has been placed on BiPAP then orally intubated ~12/8-12/11.  Pt is now extubated but continues to have thick sounding secretions during an involuntary cough.  He has a declined Cognitive status w/ reduced alertness to task of po's; he responded only to mod verbal/tactile stim by SLP during this session. Noted Left pupil appeared larger than the Right pupil. He was nonverbal w/ open-mouth posture. Sister present.       SLP Plan  Continue with current plan of care       Recommendations  Diet  recommendations: NPO Medication Administration: Via alternative means                General recommendations: (Dietician f/u; Palliative Care f/u) Oral Care Recommendations: Oral care QID;Staff/trained caregiver to provide oral care Follow up Recommendations: Skilled Nursing  facility(TBD) SLP Visit Diagnosis: Dysphagia, oropharyngeal phase (R13.12) Plan: Continue with current plan of care       [place], [employer], CCC-SLP Watson,Katherine 08/04/2018, 3:03 PM

## 2018-08-04 NOTE — Progress Notes (Signed)
[place] INFECTIOUS DISEASE PROGRESS NOTE Date of Admission:  07/22/2018     ID: Maurice Patterson is a 61 y.o. male with pneumonia and AMS  Principal Problem:   CAP (community acquired pneumonia) Active Problems:   Uncontrolled type 2 diabetes mellitus with hyperglycemia, with long-term current use of insulin (HCC)   Essential hypertension   Alzheimer's dementia ([place])   GERD (gastroesophageal reflux disease)   Fever   Acute respiratory failure (HCC)  Subjective: Remains extubated. No fevers. Still very lethargic. Dobhoff.  ROS  Unable to obtain  Medications:  Antibiotics Given (last 72 hours)    Date/Time Action Medication Dose Rate   08/01/18 1332 New Bag/Given   ceFEPIme (MAXIPIME) 2 g in sodium chloride 0.9 % 100 mL IVPB 2 g 200 mL/hr   08/01/18 1838 Given   metroNIDAZOLE (FLAGYL) tablet 500 mg 500 mg    08/01/18 2122 New Bag/Given   ceFEPIme (MAXIPIME) 2 g in sodium chloride 0.9 % 100 mL IVPB 2 g 200 mL/hr   08/01/18 2130 Given   metroNIDAZOLE (FLAGYL) tablet 500 mg 500 mg    08/02/18 8850 Given   metroNIDAZOLE (FLAGYL) tablet 500 mg 500 mg    08/02/18 0956 New Bag/Given   ceFEPIme (MAXIPIME) 2 g in sodium chloride 0.9 % 100 mL IVPB 2 g 200 mL/hr   08/02/18 2147 New Bag/Given   ceFEPIme (MAXIPIME) 2 g in sodium chloride 0.9 % 100 mL IVPB 2 g 200 mL/hr   08/03/18 0901 New Bag/Given   ceFEPIme (MAXIPIME) 2 g in sodium chloride 0.9 % 100 mL IVPB 2 g 200 mL/hr   08/03/18 1310 New Bag/Given   metroNIDAZOLE (FLAGYL) IVPB 500 mg 500 mg 100 mL/hr   08/03/18 2212 New Bag/Given   ceFEPIme (MAXIPIME) 2 g in sodium chloride 0.9 % 100 mL IVPB 2 g 200 mL/hr   08/03/18 2254 New Bag/Given   metroNIDAZOLE (FLAGYL) IVPB 500 mg 500 mg 100 mL/hr   08/04/18 0431 New Bag/Given   metroNIDAZOLE (FLAGYL) IVPB 500 mg 500 mg 100 mL/hr     . aspirin  81 mg Oral Daily  . chlorhexidine gluconate (MEDLINE KIT)  15 mL Mouth Rinse BID  . donepezil  10 mg Oral BID  . enoxaparin (LOVENOX)  injection  40 mg Subcutaneous Q24H  . feeding supplement (PRO-STAT SUGAR FREE 64)  30 mL Per Tube Daily  . free water  180 mL Per Tube Q4H  . insulin aspart  0-15 Units Subcutaneous Q4H  . ipratropium-albuterol  3 mL Nebulization Q4H  . lamoTRIgine  100 mg Oral BID  . lidocaine  1 patch Transdermal Q24H  . mouth rinse  15 mL Mouth Rinse 10 times per day  . memantine  10 mg Oral BID  . metoprolol tartrate  25 mg Per Tube BID  . pantoprazole (PROTONIX) IV  40 mg Intravenous QHS  . sodium chloride flush  10-40 mL Intracatheter Q12H  . thiamine  100 mg Oral Daily    Objective: Vital signs in last 24 hours: Temp:  [97.6 F (36.4 C)-99 F (37.2 C)] 98.2 F (36.8 C) (12/13 0800) Pulse Rate:  [96-113] 113 (12/13 1000) Resp:  [16-26] 26 (12/13 0800) BP: (104-160)/(61-100) 143/95 (12/13 1000) SpO2:  [94 %-100 %] 94 % (12/13 1000) FiO2 (%):  [28 %] 28 % (12/13 0724) Weight:  [63.2 kg] 63.2 kg (12/13 0500) Physical Exam  Constitutional: , lethargic HENT: aniceric,  reactive Mouth/Throat: MM dry Cardiovascular: Normal rate, regular rhythm and normal heart sounds.  Pulmonary/Chest: bil rhonchi and poor air movement Abdominal: Soft. Bowel sounds are normal. He exhibits no distension. There is no tenderness.  Lymphadenopathy: He has no cervical adenopathy.  Neurological: off sedation but remains very lethargic R groin TLC Skin: Skin is warm and dry. No rash noted. No erythema.  Psychiatric: min responsive Ext trace edema bil  Lab Results Recent Labs    08/02/18 0421 08/03/18 0431 08/04/18 0546  WBC 7.9  --  9.6  HGB 10.5*  --  12.9*  HCT 33.4*  --  40.6  NA 139 142 147*  K 3.3* 3.5 3.5  CL 105 106 110  CO2 '27 27 27  '$ BUN 26* 22 27*  CREATININE 1.11 0.94 1.08    Microbiology: Results for orders placed or performed during the hospital encounter of 07/22/18  Urine culture     Status: None   Collection Time: 07/22/18 11:03 PM  Result Value Ref Range Status   Specimen  Description   Final    URINE, RANDOM Performed at [facility], [address] [place], [place] [address]    Special Requests   Final    NONE Performed at [facility], [address], [place], [place] [address]    Culture   Final    NO GROWTH Performed at [facility], [place] [address], [place], [place] [address]    Report Status 07/24/2018 FINAL  Final  CULTURE, BLOOD (ROUTINE X 2) w Reflex to ID Panel     Status: None   Collection Time: 07/23/18 10:06 AM  Result Value Ref Range Status   Specimen Description BLOOD BLOOD LEFT ARM  Final   Special Requests   Final    BOTTLES DRAWN AEROBIC AND ANAEROBIC Blood Culture adequate volume   Culture   Final    NO GROWTH 5 DAYS Performed at [facility], [address] [place], [place] [address]    Report Status 07/28/2018 FINAL  Final  CULTURE, BLOOD (ROUTINE X 2) w Reflex to ID Panel     Status: None   Collection Time: 07/23/18 10:17 AM  Result Value Ref Range Status   Specimen Description BLOOD BLOOD LEFT WRIST  Final   Special Requests   Final    BOTTLES DRAWN AEROBIC AND ANAEROBIC Blood Culture adequate volume   Culture   Final    NO GROWTH 5 DAYS Performed at [facility], [address], [place], [place] [address]    Report Status 07/28/2018 FINAL  Final  Gram stain     Status: None   Collection Time: 07/26/18  3:11 PM  Result Value Ref Range Status   Specimen Description CSF  Final   Special Requests NONE  Final   Gram Stain   Final    NO ORGANISMS SEEN WBC SEEN Performed at [facility], [address] [place], [place] [address]    Report Status 07/26/2018 FINAL  Final  CSF culture     Status: None   Collection Time: 07/26/18  3:11 PM  Result Value Ref Range Status   Specimen Description   Final    CSF Performed at [facility], [address], [place], [place] [address]    Special Requests   Final    NONE Performed at [facility], [address] [place],  [address]    Gram Stain   Final    NO ORGANISMS SEEN WBC SEEN NO RBC SEEN Performed at [facility], [place]  [address] [place], [place] [address]    Culture   Final    NO GROWTH 3 DAYS Performed at [facility], [place] [address] [place], [place] [address]    Report Status 07/30/2018 FINAL  Final  Fungus Culture With Stain     Status: None (Preliminary result)   Collection Time: 07/26/18  3:11 PM  Result Value Ref Range Status   Fungus Stain Final report  Final    Comment: (NOTE) Performed At: [facility] [place], [place] [id number] Rush Farmer MD [id number]    Fungus (Mycology) Culture PENDING  Incomplete   Fungal Source CSF  Final    Comment: Performed at [facility], [address] [place], [place] [address]  Fungus Culture Result     Status: None   Collection Time: 07/26/18  3:11 PM  Result Value Ref Range Status   Result 1 Comment  Final    Comment: (NOTE) KOH/Calcofluor preparation:  no fungus observed. Performed At: [facility] [place], [place] [id number] Rush Farmer MD [id number]   CULTURE, BLOOD (ROUTINE X 2) w Reflex to ID Panel     Status: None   Collection Time: 07/29/18  6:14 AM  Result Value Ref Range Status   Specimen Description BLOOD LEFT FOREARM  Final   Special Requests   Final    BOTTLES DRAWN AEROBIC AND ANAEROBIC Blood Culture adequate volume   Culture   Final    NO GROWTH 5 DAYS Performed at [facility], [address] [place], [place] [address]    Report Status 08/03/2018 FINAL  Final  CULTURE, BLOOD (ROUTINE X 2) w Reflex to ID Panel     Status: None   Collection Time: 07/29/18  6:17 AM  Result Value Ref Range Status   Specimen Description BLOOD LEFT HAND  Final   Special Requests   Final    BOTTLES DRAWN AEROBIC AND ANAEROBIC Blood Culture adequate volume   Culture   Final    NO GROWTH 5 DAYS Performed at [facility], [address], [place], [place] [address]    Report Status 08/03/2018 FINAL  Final  MRSA PCR Screening     Status: None   Collection Time: 07/29/18  4:18 PM  Result Value Ref Range Status   MRSA by PCR NEGATIVE NEGATIVE Final    Comment:        The GeneXpert MRSA Assay (FDA approved for NASAL specimens only), is one component of a comprehensive MRSA colonization surveillance program. It is not intended to diagnose MRSA infection nor to guide or monitor treatment for MRSA infections. Performed at [facility], [address] [place], [place] [address]   Culture, respiratory     Status: None   Collection Time: 08/01/18  3:27 PM  Result Value Ref Range Status   Specimen Description   Final    TRACHEAL ASPIRATE Performed at [facility], [address], [place], [place] [address]    Special Requests   Final    NONE Performed at [facility], [address] [place], [place] [address]    Gram Stain   Final    MODERATE WBC PRESENT,BOTH PMN AND MONONUCLEAR RARE GRAM POSITIVE COCCI Performed at [facility], [place] [address] [place],  [address]    Culture FEW CANDIDA TROPICALIS  Final   Report Status 08/04/2018 FINAL  Final  Culture, respiratory (non-expectorated)     Status: None (Preliminary result)  Collection Time: 08/03/18 11:12 AM  Result Value Ref Range Status   Specimen Description   Final    TRACHEAL ASPIRATE Performed at [facility], [address], [place], [place] [address]    Special Requests   Final    NONE Performed at [facility], [place], [place] [address]    Gram Stain   Final    NO WBC SEEN RARE SQUAMOUS EPITHELIAL CELLS PRESENT RARE GRAM POSITIVE COCCI    Culture   Final    CULTURE REINCUBATED FOR BETTER GROWTH Performed at [facility], [place] [address], [place], [place] [address]    Report Status PENDING  Incomplete    Studies/Results: Dg Abd  1 View  Result Date: 08/03/2018 CLINICAL DATA:  NG tube placement. EXAM: ABDOMEN - 1 VIEW COMPARISON:  07/29/2018. FINDINGS: NG tube now noted with tip projected over the right lung base most likely in a right lower lobe bronchus. No bowel distention. No free air. IMPRESSION: NG tube now noted with tip projected over the right lung base most likely in the right lower lobe bronchus. Critical Value/emergent results were called by telephone at the time of interpretation on 08/03/2018 at 11:34 am to nurse [place], who verbally acknowledged these results. Electronically Signed   By: Marcello Moores  Register   On: 08/03/2018 11:35    Assessment/Plan: EFSTATHIOS SAWIN is a 62 y.o. male admitted with confusion, weakness, possible PNA. Marland Kitchen Has had MRI x 2, CT of brain with nothing acute. Seen by neurology. EEG with diffuse slowing. Utox neg, Flu neg, HIV neg, RPR neg, TSH neg, UA negative, abg unimpressive. CSF with very mild elevated wbc and protein.  I do not think he has meningitis or encephalitis and certainly does not have a bacterial meningitis with such a low wbc. He follows with neurology as otpt for transient memory loss and expressive aphasia in past, hx of stroke and seizures.  I suspect current presentation is multifactorial.  12/7 high fever last night, diaphoretic, remains on O2 with increased resp distress today. Remains lethargic 12/8 transferred to unit for resp distress, cxr with bibasilar infiltrates, likely aspiration. wbc elevated.  12/9 temp to 100.9 but HD stable and stable on vent. HSV PCR neg 12/10- febrile again to 102.5 but wbc down  to 10. Echo done and negative. However overall improving, not on pressors, improving on vent settings and wife reports he is waking up more 12/11- Extubated today. no fevers, changed zosyn to cefepime and flagyl. WBC down to 7.8  12/12 no fevers remains extubated. Still lethargic 12/13 - stable since extubated, still not very responsive Recommendations. Cont  cefepime and flagyl - stop date of 12/13 sputum cx with candida and follow up pending Remain off vanco since MRSA PCR negative making MRSA pna very unlikely. ARF is improving.  Cont neuro work up.  Thank you very much for the consult. Will follow with you.  Leonel Ramsay   08/04/2018, 11:18 AM

## 2018-08-04 NOTE — Progress Notes (Signed)
Pt transferred to IR for possible dobhoff placement

## 2018-08-04 NOTE — Progress Notes (Signed)
CRITICAL CARE NOTE  CC  follow up respiratory failure  SUBJECTIVE No new events. Remains poorly responsive Prognosis is guarded     SIGNIFICANT EVENTS None     BP (!) 150/78   Pulse (!) 101   Temp 97.6 F (36.4 C) (Axillary)   Resp (!) 22   Ht '5\' 6"'$  (1.676 m)   Wt 63.2 kg   SpO2 96%   BMI 22.49 kg/m    REVIEW OF SYSTEMS  PATIENT IS UNABLE TO PROVIDE COMPLETE REVIEW OF SYSTEMS DUE TO SEVERE CRITICAL ILLNESS   PHYSICAL EXAMINATION:  GENERAL:no resp distress, appears comfortable HEAD: Normocephalic, atraumatic.  EYES: Pupils equal, round, reactive to light.  No scleral icterus.  MOUTH: Moist mucosal membrane. NECK: Supple. No thyromegaly. No nodules. No JVD.  PULMONARY:no sig rhonci or wheezing CARDIOVASCULAR: S1 and S2. Regular rate and rhythm. No murmurs, rubs, or gallops.  GASTROINTESTINAL: Soft, nontender, -distended. No masses. Positive bowel sounds. No hepatosplenomegaly.  MUSCULOSKELETAL: No swelling, clubbing, or edema.  NEUROLOGIC: obtunded, GCS<8 SKIN:intact,warm,dry Chronic skin changes of lower extremeties  INTAKE/OUTPUT  Intake/Output Summary (Last 24 hours) at 08/04/2018 0806 Last data filed at 08/04/2018 0600 Gross per 24 hour  Intake 220 ml  Output 1550 ml  Net -1330 ml    LABS  CBC Recent Labs  Lab 08/01/18 0514 08/02/18 0421 08/04/18 0546  WBC 10.3 7.9 9.6  HGB 10.3* 10.5* 12.9*  HCT 33.0* 33.4* 40.6  PLT 260 222 327   Coag's No results for input(s): APTT, INR in the last 168 hours. BMET Recent Labs  Lab 08/02/18 0421 08/03/18 0431 08/04/18 0546  NA 139 142 147*  K 3.3* 3.5 3.5  CL 105 106 110  CO2 '27 27 27  '$ BUN 26* 22 27*  CREATININE 1.11 0.94 1.08  GLUCOSE 401* 228* 201*   Electrolytes Recent Labs  Lab 07/31/18 0528 08/01/18 0514 08/02/18 0421 08/03/18 0431 08/04/18 0546  CALCIUM 8.2* 7.9* 7.7* 7.9* 8.3*  MG 2.3  --   --  2.0 2.4  PHOS 1.9* 2.3*  --  2.4* 2.4*   Sepsis Markers Recent Labs  Lab  07/29/18 0616 07/30/18 0501 07/31/18 0528  PROCALCITON 0.33 1.28 0.76   ABG Recent Labs  Lab 07/29/18 1807  PHART 7.35  PCO2ART 44  PO2ART 98   Liver Enzymes Recent Labs  Lab 07/29/18 0324 07/30/18 0501 08/03/18 0431  AST '19 17 22  '$ ALT '17 15 21  '$ ALKPHOS 141* 117 100  BILITOT 0.7 0.4 0.8  ALBUMIN 2.9* 2.3* 2.5*   Cardiac Enzymes No results for input(s): TROPONINI, PROBNP in the last 168 hours. Glucose Recent Labs  Lab 08/03/18 1119 08/03/18 1641 08/03/18 1922 08/03/18 2319 08/04/18 0320 08/04/18 0728  GLUCAP 229* 224* 257* 202* 206* 212*     Recent Results (from the past 240 hour(s))  Gram stain     Status: None   Collection Time: 07/26/18  3:11 PM  Result Value Ref Range Status   Specimen Description CSF  Final   Special Requests NONE  Final   Gram Stain   Final    NO ORGANISMS SEEN WBC SEEN Performed at [facility], [address], [place], [place] [address]    Report Status 07/26/2018 FINAL  Final  CSF culture     Status: None   Collection Time: 07/26/18  3:11 PM  Result Value Ref Range Status   Specimen Description   Final    CSF Performed at [facility], [address] [place],  [place] [address]    Special Requests   Final    NONE Performed at [facility], [address] [place], [place] [address]    Gram Stain   Final    NO ORGANISMS SEEN WBC SEEN NO RBC SEEN Performed at [facility], [address], [place], [place] [address]    Culture   Final    NO GROWTH 3 DAYS Performed at [facility], [place] [address] [place], [place] [address]    Report Status 07/30/2018 FINAL  Final  Fungus Culture With Stain     Status: None (Preliminary result)   Collection Time: 07/26/18  3:11 PM  Result Value Ref Range Status   Fungus Stain Final report  Final    Comment: (NOTE) Performed At: [facility] [place], [place] [id number] Rush Farmer MD [id number]    Fungus  (Mycology) Culture PENDING  Incomplete   Fungal Source CSF  Final    Comment: Performed at [facility], [address] [place], [place] [address]  Fungus Culture Result     Status: None   Collection Time: 07/26/18  3:11 PM  Result Value Ref Range Status   Result 1 Comment  Final    Comment: (NOTE) KOH/Calcofluor preparation:  no fungus observed. Performed At: [facility] [place], [place] [id number] Rush Farmer MD [id number]   CULTURE, BLOOD (ROUTINE X 2) w Reflex to ID Panel     Status: None   Collection Time: 07/29/18  6:14 AM  Result Value Ref Range Status   Specimen Description BLOOD LEFT FOREARM  Final   Special Requests   Final    BOTTLES DRAWN AEROBIC AND ANAEROBIC Blood Culture adequate volume   Culture   Final    NO GROWTH 5 DAYS Performed at [facility], [address] [place], [place] [address]    Report Status 08/03/2018 FINAL  Final  CULTURE, BLOOD (ROUTINE X 2) w Reflex to ID Panel     Status: None   Collection Time: 07/29/18  6:17 AM  Result Value Ref Range Status   Specimen Description BLOOD LEFT HAND  Final   Special Requests   Final    BOTTLES DRAWN AEROBIC AND ANAEROBIC Blood Culture adequate volume   Culture   Final    NO GROWTH 5 DAYS Performed at [facility], [address], [place], [place] [address]    Report Status 08/03/2018 FINAL  Final  MRSA PCR Screening     Status: None   Collection Time: 07/29/18  4:18 PM  Result Value Ref Range Status   MRSA by PCR NEGATIVE NEGATIVE Final    Comment:        The GeneXpert MRSA Assay (FDA approved for NASAL specimens only), is one component of a comprehensive MRSA colonization surveillance program. It is not intended to diagnose MRSA infection nor to guide or monitor treatment for MRSA infections. Performed at [facility], [address] [place], [place] [address]   Culture, respiratory     Status: None (Preliminary result)    Collection Time: 08/01/18  3:27 PM  Result Value Ref Range Status   Specimen Description   Final    TRACHEAL ASPIRATE Performed at [facility], [address] [place], [place] [address]    Special Requests   Final    NONE Performed at [facility], [address], [place], [place] [address]    Gram Stain   Final  MODERATE WBC PRESENT,BOTH PMN AND MONONUCLEAR RARE GRAM POSITIVE COCCI    Culture   Final    CULTURE REINCUBATED FOR BETTER GROWTH Performed at [facility], [place] [address], [place], [place] [address]    Report Status PENDING  Incomplete  Culture, respiratory (non-expectorated)     Status: None (Preliminary result)   Collection Time: 08/03/18 11:12 AM  Result Value Ref Range Status   Specimen Description   Final    TRACHEAL ASPIRATE Performed at [facility], [address], [place], [place] [address]    Special Requests   Final    NONE Performed at [facility], [place], [place] [address]    Gram Stain   Final    NO WBC SEEN RARE SQUAMOUS EPITHELIAL CELLS PRESENT RARE GRAM POSITIVE COCCI Performed at [facility], [place] [address], [place], [place] [address]    Culture PENDING  Incomplete   Report Status PENDING  Incomplete    MEDICATIONS   Current Facility-Administered Medications:  .  acetaminophen (TYLENOL) tablet 650 mg, 650 mg, Oral, Q6H PRN, 650 mg at 08/01/18 0811 **OR** acetaminophen (TYLENOL) suppository 650 mg, 650 mg, Rectal, Q6H PRN, Lance Coon, MD, 650 mg at 07/29/18 0038 .  albuterol (PROVENTIL) (2.5 MG/3ML) 0.083% nebulizer solution 2.5 mg, 2.5 mg, Nebulization, Q6H PRN, Leonel Ramsay, MD .  aspirin chewable tablet 81 mg, 81 mg, Oral, Daily, Conforti, John, DO, 81 mg at 08/02/18 0952 .  ceFEPIme (MAXIPIME) 2 g in sodium chloride 0.9 % 100 mL IVPB, 2 g, Intravenous, Q12H, Leonel Ramsay, MD, Last Rate: 200 mL/hr at 08/03/18 2212, 2 g at 08/03/18 2212 .   chlorhexidine gluconate (MEDLINE KIT) (PERIDEX) 0.12 % solution 15 mL, 15 mL, Mouth Rinse, BID, Conforti, John, DO, 15 mL at 08/03/18 1946 .  donepezil (ARICEPT) tablet 10 mg, 10 mg, Oral, BID, Lance Coon, MD, 10 mg at 08/02/18 1696 .  enoxaparin (LOVENOX) injection 40 mg, 40 mg, Subcutaneous, Q24H, Kalisetti, Radhika, MD, 40 mg at 08/03/18 0900 .  feeding supplement (OSMOLITE 1.5 CAL) liquid 1,000 mL, 1,000 mL, Per Tube, Continuous, Tyler Pita, MD, Stopped at 08/03/18 2150 .  feeding supplement (PRO-STAT SUGAR FREE 64) liquid 30 mL, 30 mL, Per Tube, Daily, Vernard Gambles L, MD .  fentaNYL 10 mcg/ml infusion, , , ,  .  free water 180 mL, 180 mL, Per Tube, Q4H, Tyler Pita, MD .  guaiFENesin-dextromethorphan (ROBITUSSIN DM) 100-10 MG/5ML syrup 5 mL, 5 mL, Oral, Q4H PRN, Lance Coon, MD .  insulin aspart (novoLOG) injection 0-15 Units, 0-15 Units, Subcutaneous, Q4H, Tyler Pita, MD, 5 Units at 08/04/18 0328 .  ipratropium-albuterol (DUONEB) 0.5-2.5 (3) MG/3ML nebulizer solution 3 mL, 3 mL, Nebulization, Q4H, Vaughan Basta, MD, 3 mL at 08/04/18 0724 .  labetalol (NORMODYNE,TRANDATE) injection 10 mg, 10 mg, Intravenous, Q6H PRN, Gouru, Aruna, MD, 10 mg at 08/02/18 2319 .  lacosamide (VIMPAT) 200 mg in sodium chloride 0.9 % 25 mL IVPB, 200 mg, Intravenous, Q12H, Arnaldo Natal, MD, Stopped at 08/04/18 0040 .  lamoTRIgine (LAMICTAL) tablet 100 mg, 100 mg, Oral, BID, Lance Coon, MD, 100 mg at 08/02/18 7893 .  lidocaine (LIDODERM) 5 % 1 patch, 1 patch, Transdermal, Q24H, Gladstone Lighter, MD, 1 patch at 08/03/18 1248 .  MEDLINE mouth rinse, 15 mL, Mouth Rinse, 10 times per day, Conforti, John, DO, 15 mL at 08/04/18 0543 .  memantine (NAMENDA) tablet 10 mg, 10 mg, Oral, BID, Lance Coon,  MD, 10 mg at 08/02/18 0952 .  metoprolol tartrate (LOPRESSOR) tablet 25 mg, 25 mg, Per Tube, BID, Tyler Pita, MD, 25 mg at 08/02/18 0953 .  metroNIDAZOLE (FLAGYL) IVPB 500  mg, 500 mg, Intravenous, Q8H, Hallaji, Sheema M, RPH, Last Rate: 100 mL/hr at 08/04/18 0431, 500 mg at 08/04/18 0431 .  ondansetron (ZOFRAN) tablet 4 mg, 4 mg, Oral, Q6H PRN **OR** ondansetron (ZOFRAN) injection 4 mg, 4 mg, Intravenous, Q6H PRN, Lance Coon, MD .  pantoprazole (PROTONIX) injection 40 mg, 40 mg, Intravenous, QHS, Tukov-Yual, Magdalene S, NP, 40 mg at 08/03/18 2205 .  phenol (CHLORASEPTIC) mouth spray 1 spray, 1 spray, Mouth/Throat, PRN, Lance Coon, MD, 1 spray at 08/02/18 2154 .  sodium chloride flush (NS) 0.9 % injection 10-40 mL, 10-40 mL, Intracatheter, Q12H, [place]y, Dreama Saa, NP, 10 mL at 08/03/18 2216 .  sodium chloride flush (NS) 0.9 % injection 10-40 mL, 10-40 mL, Intracatheter, PRN, Awilda Bill, NP .  thiamine (VITAMIN B-1) tablet 100 mg, 100 mg, Oral, Daily, Tyler Pita, MD      Indwelling Urinary Catheter continued, requirement due to I/o monitoring.      ASSESSMENT AND PLAN SYNOPSIS .  Acute hypoxemic respiratory failure, liberated from the ventilator yesterday.  Cause wasmost likely aspiration in the setting encephalopathy.  Still requiring frequent suctioning by RT.cont abx to complete 5 to 7 days of rx.  2.  Toxic metabolic encephalopathy with negative imaging studies and lumbar puncture, evaluated by neurology.  Nothing further to do.  3.  Hyperglycemia.On sliding scale insulin coverage, better control after steroids discontinued.  4. Elcetrolyte imbalance, correct na  5.  History of seizure disorder.Patient is on Lamictal  6.  Leukocytosis, resolved.Most likely secondary to pneumonia on empiric broad-spectrum coverage steroids discontinued which may have been aggravating this issue.  He has been afebrile.  7.  Acute renal failure: Resolved/better  8.  Cardiac murmur NO AS as by echo. 9.  Dysphagia: Nursing has failed to place NG tube for feeding.  Will ask radiology to place postpyloric feeding tube  fluoroscopically.   Critical Care Time devoted to patient care services described in this note is 30 minutes.   He remains stable, consider transfer to tele bed. Rosine Door, MD  08/04/2018 8:06 AM [facility]

## 2018-08-04 NOTE — Progress Notes (Signed)
Patient on 2L nasal cannula through out the night and in no distress. Patient is still non-verbal and intermittently following simple commands. Wife visited at the beginning of shift and was updated regarding patient status. Wife stated no one contacted her regarding NG tube or dob-hoff placement and would like everything communicated to her in the future.

## 2018-08-05 LAB — BASIC METABOLIC PANEL
Anion gap: 6 (ref 5–15)
BUN: 24 mg/dL — ABNORMAL HIGH (ref 8–23)
CO2: 28 mmol/L (ref 22–32)
Calcium: 7.9 mg/dL — ABNORMAL LOW (ref 8.9–10.3)
Chloride: 110 mmol/L (ref 98–111)
Creatinine, Ser: 1.1 mg/dL (ref 0.61–1.24)
GFR calc Af Amer: >60 mL/min (ref >60)
Glucose, Bld: 383 mg/dL — ABNORMAL HIGH (ref 70–99)
Potassium: 3.4 mmol/L — ABNORMAL LOW (ref 3.5–5.1)
Sodium: 144 mmol/L (ref 135–145)

## 2018-08-05 LAB — PHOSPHORUS: PHOSPHORUS: 2.3 mg/dL — AB (ref 2.5–4.6)

## 2018-08-05 LAB — GLUCOSE, CAPILLARY
Glucose-Capillary: 213 mg/dL — ABNORMAL HIGH (ref 70–99)
Glucose-Capillary: 254 mg/dL — ABNORMAL HIGH (ref 70–99)
Glucose-Capillary: 273 mg/dL — ABNORMAL HIGH (ref 70–99)
Glucose-Capillary: 341 mg/dL — ABNORMAL HIGH (ref 70–99)
Glucose-Capillary: 344 mg/dL — ABNORMAL HIGH (ref 70–99)
Glucose-Capillary: 356 mg/dL — ABNORMAL HIGH (ref 70–99)
Glucose-Capillary: 367 mg/dL — ABNORMAL HIGH (ref 70–99)

## 2018-08-05 LAB — MAGNESIUM: MAGNESIUM: 2.2 mg/dL (ref 1.7–2.4)

## 2018-08-05 MED ORDER — SODIUM CHLORIDE 0.9 % IV SOLN
INTRAVENOUS | Status: DC | PRN
  Administered 2018-08-05 – 2018-08-06 (×2): 250 mL via INTRAVENOUS
  Administered 2018-08-08: 500 mL via INTRAVENOUS
  Administered 2018-08-13: 10 mL/h via INTRAVENOUS
  Administered 2018-08-27: 1000 mL via INTRAVENOUS
  Administered 2018-08-28: 12:00:00 via INTRAVENOUS
  Administered 2018-08-30: 250 mL via INTRAVENOUS

## 2018-08-05 MED ORDER — MODAFINIL 100 MG PO TABS
100.0000 mg | ORAL_TABLET | Freq: Every day | ORAL | Status: DC
  Administered 2018-08-05 – 2018-08-07 (×3): 100 mg via NASOGASTRIC
  Filled 2018-08-05 (×3): qty 1

## 2018-08-05 NOTE — Progress Notes (Signed)
[place] at [place] NAME: Maurice Patterson    MR#:  [id number]  DATE OF BIRTH:  03-May-1956  SUBJECTIVE:  CHIEF COMPLAINT: Patient continues to have poor responsiveness     REVIEW OF SYSTEMS:  Review of system unobtainable as the patient is still lethargic DRUG ALLERGIES:   Allergies  Allergen Reactions  . Hydrocodone Anaphylaxis  . Morphine Other (See Comments)    Loss of memory  . Ambien [Zolpidem] Other (See Comments)    delirium    . Brilinta [Ticagrelor] Other (See Comments)    Stroke   . Flexeril [Cyclobenzaprine] Other (See Comments)    delerium   . Flunitrazepam Other (See Comments)    ROHYPNOL (hallucinations)  . Haldol [Haloperidol Lactate] Other (See Comments)    delerium   . Levetiracetam Diarrhea and Other (See Comments)    Unable to walk  . Lorazepam Hives  . Risperdal [Risperidone] Other (See Comments)    Delirium   . Trazodone Other (See Comments)    Delirium Can take in low doses   . Benadryl [Diphenhydramine Hcl (Sleep)] Rash  . Penicillins Rash    Mouth ulcers Has patient had a PCN reaction causing immediate rash, facial/tongue/throat swelling, SOB or lightheadedness with hypotension: Yes Has patient had a PCN reaction causing severe rash involving mucus membranes or skin necrosis: No Has patient had a PCN reaction that required hospitalization No Has patient had a PCN reaction occurring within the last 10 years: Yes If all of the above answers are "NO", then may proceed with Cephalosporin use.    VITALS:  Blood pressure (!) 167/77, pulse 92, temperature 99.4 F (37.4 C), temperature source Oral, resp. rate 20, height 5\' 6"  (1.676 m), weight 65.9 kg, SpO2 99 %.  PHYSICAL EXAMINATION:  Physical Exam  GENERAL:  62 y.o.-year-old patient lying in the bed chronically ill-appearing EYES: Pupils equal, round, reactive to light and accommodation. No scleral icterus. HEENT: Head atraumatic,  normocephalic. Oropharynx and nasopharynx clear.  NECK:  Supple, no jugular venous distention. No thyroid enlargement, no tenderness.  LUNGS: Normal breath sounds bilaterally, increased rate, no wheezing, some crepitation.  CARDIOVASCULAR: S1, S2 normal.  Systolic murmur is present, no rubs, or gallops.  ABDOMEN: Soft, nontender, nondistended. Bowel sounds present.  EXTREMITIES: No pedal edema, cyanosis, or clubbing.  NEUROLOGIC: stiffer left upper extremity,  PSYCHIATRIC:   Not following commands.   SKIN: No obvious rash, lesion, or ulcer.    LABORATORY PANEL:   CBC Recent Labs  Lab 08/04/18 0546  WBC 9.6  HGB 12.9*  HCT 40.6  PLT 327   ------------------------------------------------------------------------------------------------------------------  Chemistries  Recent Labs  Lab 08/03/18 0431  08/05/18 0423  NA 142   < > 144  K 3.5   < > 3.4*  CL 106   < > 110  CO2 27   < > 28  GLUCOSE 228*   < > 383*  BUN 22   < > 24*  CREATININE 0.94   < > 1.10  CALCIUM 7.9*   < > 7.9*  MG 2.0   < > 2.2  AST 22  --   --   ALT 21  --   --   ALKPHOS 100  --   --   BILITOT 0.8  --   --    < > = values in this interval not displayed.   ------------------------------------------------------------------------------------------------------------------  Cardiac Enzymes No results for input(s): TROPONINI in the last 168 hours. ------------------------------------------------------------------------------------------------------------------  RADIOLOGY:  Dg Naso G Tube Plc W/fl W/rad  Result Date: 08/04/2018 CLINICAL DATA:  Feeding difficulties. EXAM: NASO G TUBE PLACEMENT WITH FL AND WITH RAD CONTRAST:  None. FLUOROSCOPY TIME:  Fluoroscopy Time:  5 minutes 12 seconds Number of Acquired Spot Images: 1 mm COMPARISON:  Prior study 08/03/2018. FINDINGS: Dobbhoff tube was placed through the right nares into the stomach under fluoroscopic guidance. Double tube tip could not be placed into  the duodenum. There no complications. IMPRESSION: Successful Dobbhoff tube placement under fluoroscopic guidance. Electronically Signed   By: Marcello Moores  Register   On: 08/04/2018 11:30    EKG:   Orders placed or performed during the hospital encounter of 07/22/18  . EKG 12-Lead  . EKG 12-Lead  . EKG 12-Lead  . EKG 12-Lead  . ED EKG  . ED EKG    ASSESSMENT AND PLAN:   62 year old male with past medical history significant for CAD, CHF, CKD, dementia, diabetes, hypertension and seizure disorder presents to hospital secondary to worsening weakness and poor appetite  Acute respiratory failure with hypoxemia Possible aspiration pneumonia on broad-spectrum IV antibiotics Got extubated 08/02/2018 Speech therapy evaluation once patient awake   -Acute toxic metabolic encephalopathy with past medical history of HSV encephalitis CT head is negative LP done and no evidence of bacterial meningitis with normal WBC and elevated glucose and protein HSV PCR test is negative and acyclovir discontinued per ID Appreciate ID and neurology recommendations Gram stain is negative.  Cultures on CSF is negative. Respiratory culture collected on 08/03/2018 revealed rare  gram-positive cocci Initial EEG without seizure activity but generalized slowing.  Repeat EEG - no seizure like activities. We will try Provigil in this patient to see if that will help   -Aspiration pneumonia- ac respi failure with hypoxia Started on broad-spectrum antibiotics Zosyn for aspiration pneumonia 07/31/2018-100.9 08/01/2018-temperature 102.5 but leukocytosis is trending down ID has recommended to discontinue Zosyn and started patient on cefepime and Flagyl.   repeat blood cultures negative MRSA PCR negative.    --  Hypokalemia- replaced.  -Hyponatremia improved   -  Diabetes mellitus on insulin sliding scale, discontinued insulin drip.  -  History of seizure disorder/myoclonic jerks-on Lamictal. -According to wife,  patient does not have any history of seizures -EEG with generalized slowing, no seizure-like activity.  Empirically Vimpat was added by neurology  -  DVT prophylaxis-on Lovenox   -. Nutrition- Failed attempt for PICC line initially. Awaiting speech therapy assessment, currently n.p.o.    All the records are reviewed and case discussed with Care Management/Social Workerr. Management plans discussed with the patient, family and they are in agreement.  CODE STATUS: Full Code  TOTAL TIME TAKING CARE OF THIS PATIENT: 35 minutes.   POSSIBLE D/C IN 2 DAYS, DEPENDING ON CLINICAL CONDITION.   Dustin Flock M.D on 08/05/2018 at 1:50 PM  Between 7am to 6pm - Pager - [phone]  After 6pm go to [email or web address] - password [email or web address] [facility]  Office  [phone]  CC: Primary care physician; [name], MD

## 2018-08-05 NOTE — Progress Notes (Signed)
PT Cancellation Note  Patient Details Name: Maurice Patterson MRN: [id number] DOB: 1956-07-26   Cancelled Treatment:    Reason Eval/Treat Not Completed: Patient's level of consciousness.  Order received.  Chart reviewed.  Patient is currently unable to be aroused by noxious stimuli or loud verbal cues.  Will re-attempt later if pt is more appropriate.  Roxanne Gates, PT, DPT  Roxanne Gates 08/05/2018, 11:02 AM

## 2018-08-05 NOTE — Progress Notes (Signed)
PT Cancellation Note  Patient Details Name: SYLVANUS TELFORD MRN: [id number] DOB: 03/28/1956   Cancelled Treatment:    Reason Eval/Treat Not Completed: Patient's level of consciousness.  Pt still somnolent and unable to be aroused by noxious stimulus.  Will re-attempt later when pt is appropriate.  Roxanne Gates, PT, DPT  Roxanne Gates 08/05/2018, 3:21 PM

## 2018-08-06 LAB — CULTURE, RESPIRATORY W GRAM STAIN: Culture: NORMAL

## 2018-08-06 LAB — COMPREHENSIVE METABOLIC PANEL
ALK PHOS: 105 U/L (ref 38–126)
ALT: 22 U/L (ref 0–44)
AST: 22 U/L (ref 15–41)
Albumin: 2.5 g/dL — ABNORMAL LOW (ref 3.5–5.0)
Anion gap: 6 (ref 5–15)
BUN: 18 mg/dL (ref 8–23)
CALCIUM: 7.9 mg/dL — AB (ref 8.9–10.3)
CO2: 29 mmol/L (ref 22–32)
Chloride: 109 mmol/L (ref 98–111)
Creatinine, Ser: 0.95 mg/dL (ref 0.61–1.24)
GFR calc Af Amer: >60 mL/min (ref >60)
GFR calc non Af Amer: >60 mL/min (ref >60)
Glucose, Bld: 392 mg/dL — ABNORMAL HIGH (ref 70–99)
Potassium: 3.4 mmol/L — ABNORMAL LOW (ref 3.5–5.1)
Sodium: 144 mmol/L (ref 135–145)
Total Bilirubin: 0.4 mg/dL (ref 0.3–1.2)
Total Protein: 5.7 g/dL — ABNORMAL LOW (ref 6.5–8.1)

## 2018-08-06 LAB — TRIGLYCERIDES: Triglycerides: 48 mg/dL (ref <150)

## 2018-08-06 LAB — GLUCOSE, CAPILLARY
Glucose-Capillary: 291 mg/dL — ABNORMAL HIGH (ref 70–99)
Glucose-Capillary: 334 mg/dL — ABNORMAL HIGH (ref 70–99)
Glucose-Capillary: 343 mg/dL — ABNORMAL HIGH (ref 70–99)

## 2018-08-06 LAB — CBC
HCT: 39 % (ref 39.0–52.0)
Hemoglobin: 11.9 g/dL — ABNORMAL LOW (ref 13.0–17.0)
MCH: 28.8 pg (ref 26.0–34.0)
MCHC: 30.5 g/dL (ref 30.0–36.0)
MCV: 94.4 fL (ref 80.0–100.0)
Platelets: 281 10*3/uL (ref 150–400)
RBC: 4.13 MIL/uL — ABNORMAL LOW (ref 4.22–5.81)
RDW: 15.5 % (ref 11.5–15.5)
WBC: 7.9 10*3/uL (ref 4.0–10.5)
nRBC: 0 % (ref 0.0–0.2)

## 2018-08-06 LAB — CULTURE, RESPIRATORY: GRAM STAIN: NONE SEEN

## 2018-08-06 LAB — AMMONIA: Ammonia: 16 umol/L (ref 9–35)

## 2018-08-06 NOTE — Progress Notes (Signed)
[place] at [place] NAME: Maurice Patterson    MR#:  [id number]  DATE OF BIRTH:  06/13/1956  SUBJECTIVE:  CHIEF COMPLAINT: Patient more awake able to follow some commands  But to communicate   REVIEW OF SYSTEMS:  Review of system unobtainable as the patient unable to communicate DRUG ALLERGIES:   Allergies  Allergen Reactions  . Hydrocodone Anaphylaxis  . Morphine Other (See Comments)    Loss of memory  . Ambien [Zolpidem] Other (See Comments)    delirium    . Brilinta [Ticagrelor] Other (See Comments)    Stroke   . Flexeril [Cyclobenzaprine] Other (See Comments)    delerium   . Flunitrazepam Other (See Comments)    ROHYPNOL (hallucinations)  . Haldol [Haloperidol Lactate] Other (See Comments)    delerium   . Levetiracetam Diarrhea and Other (See Comments)    Unable to walk  . Lorazepam Hives  . Risperdal [Risperidone] Other (See Comments)    Delirium   . Trazodone Other (See Comments)    Delirium Can take in low doses   . Benadryl [Diphenhydramine Hcl (Sleep)] Rash  . Penicillins Rash    Mouth ulcers Has patient had a PCN reaction causing immediate rash, facial/tongue/throat swelling, SOB or lightheadedness with hypotension: Yes Has patient had a PCN reaction causing severe rash involving mucus membranes or skin necrosis: No Has patient had a PCN reaction that required hospitalization No Has patient had a PCN reaction occurring within the last 10 years: Yes If all of the above answers are "NO", then may proceed with Cephalosporin use.    VITALS:  Blood pressure (!) 155/72, pulse 81, temperature 98.6 F (37 C), temperature source Oral, resp. rate 20, height 5\' 6"  (1.676 m), weight 67.5 kg, SpO2 100 %.  PHYSICAL EXAMINATION:  Physical Exam  GENERAL:  62 y.o.-year-old patient lying in the bed chronically ill-appearing EYES: Pupils equal, round, reactive to light and accommodation. No scleral icterus. HEENT: Head  atraumatic, normocephalic. Oropharynx and nasopharynx clear.  NECK:  Supple, no jugular venous distention. No thyroid enlargement, no tenderness.  LUNGS: Normal breath sounds bilaterally, increased rate, no wheezing, some crepitation.  CARDIOVASCULAR: S1, S2 normal.  Systolic murmur is present, no rubs, or gallops.  ABDOMEN: Soft, nontender, nondistended. Bowel sounds present.  EXTREMITIES: No pedal edema, cyanosis, or clubbing.  NEUROLOGIC: stiffer left upper extremity, that is chronic for this patient PSYCHIATRIC:   Able to follow command SKIN: No obvious rash, lesion, or ulcer.    LABORATORY PANEL:   CBC Recent Labs  Lab 08/06/18 0450  WBC 7.9  HGB 11.9*  HCT 39.0  PLT 281   ------------------------------------------------------------------------------------------------------------------  Chemistries  Recent Labs  Lab 08/05/18 0423 08/06/18 0450  NA 144 144  K 3.4* 3.4*  CL 110 109  CO2 28 29  GLUCOSE 383* 392*  BUN 24* 18  CREATININE 1.10 0.95  CALCIUM 7.9* 7.9*  MG 2.2  --   AST  --  22  ALT  --  22  ALKPHOS  --  105  BILITOT  --  0.4   ------------------------------------------------------------------------------------------------------------------  Cardiac Enzymes No results for input(s): TROPONINI in the last 168 hours. ------------------------------------------------------------------------------------------------------------------  RADIOLOGY:  No results found.  EKG:   Orders placed or performed during the hospital encounter of 07/22/18  . EKG 12-Lead  . EKG 12-Lead  . EKG 12-Lead  . EKG 12-Lead  . ED EKG  . ED EKG    ASSESSMENT AND PLAN:  62 year old male with past medical history significant for CAD, CHF, CKD, dementia, diabetes, hypertension and seizure disorder presents to hospital secondary to worsening weakness and poor appetite  Acute respiratory failure with hypoxemia Possible aspiration pneumonia on broad-spectrum IV  antibiotics Got extubated 08/02/2018 Speech therapy evaluation reordered   -Acute toxic metabolic encephalopathy with past medical history of HSV encephalitis CT head is negative LP done and no evidence of bacterial meningitis with normal WBC and elevated glucose and protein HSV PCR test is negative and acyclovir discontinued per ID Appreciate ID and neurology recommendations Gram stain is negative.  Cultures on CSF is negative. Respiratory culture collected on 08/03/2018 revealed rare  gram-positive cocci Initial EEG without seizure activity but generalized slowing.  Repeat EEG - no seizure like activities. Continue Provigil mEntal status is improving I discussed the case with patient's sister who was at bedside patient has had issues with gait instability and confusion and has been seen by neurology as outpatient   -Aspiration pneumonia- ac respi failure with hypoxia Started on broad-spectrum antibiotics Zosyn for aspiration pneumonia 07/31/2018-100.9 08/01/2018-temperature 102.5 but leukocytosis is trending down ID has recommended to discontinue Zosyn and started patient on cefepime and Flagyl.   repeat blood cultures negative MRSA PCR negative.    --  Hypokalemia- replaced.  -Hyponatremia improved   -  Diabetes mellitus on insulin sliding scale, discontinued insulin drip.  -  History of seizure disorder/myoclonic jerks-on Lamictal. -According to wife, patient does not have any history of seizures -EEG with generalized slowing, no seizure-like activity.  Empirically Vimpat was added by neurology  -  DVT prophylaxis-on Lovenox   -. Nutrition- Failed attempt for PICC line initially. Awaiting speech therapy assessment, currently n.p.o.     All the records are reviewed and case discussed with Care Management/Social Workerr. Management plans discussed with the patient, family and they are in agreement.  CODE STATUS: Full Code  TOTAL TIME TAKING CARE OF THIS PATIENT: 35  minutes.   POSSIBLE D/C IN 2 DAYS, DEPENDING ON CLINICAL CONDITION.   Dustin Flock M.D on 08/06/2018 at 2:00 PM  Between 7am to 6pm - Pager - [phone]  After 6pm go to [email or web address] - password [email or web address] [facility]  Office  [phone]  CC: Primary care physician; [name], MD

## 2018-08-06 NOTE — Progress Notes (Signed)
PT Cancellation Note  Patient Details Name: Maurice Patterson MRN: [id number] DOB: [birth year]   Cancelled Treatment:    Reason Eval/Treat Not Completed: Patient's level of consciousness. Pt has been awake some today but still not responsive enough to participate with therapy.  Will re-attempt tomorrow.   Roxanne Gates, PT, DPT 08/06/2018, 4:17 PM

## 2018-08-06 NOTE — Progress Notes (Signed)
PT Cancellation Note  Patient Details Name: Maurice Patterson MRN: [id number] DOB: April 20, 1956   Cancelled Treatment:    Reason Eval/Treat Not Completed: Patient's level of consciousness.  Pt has been more awake this morning per RN and pt's sister.  Pt minimally responsive to PT, opening eyes occasionally but not moving toes or feet when asked.  Assisted in positioning pt in bed and confirmed that pt's wife will be here after lunch.  Will re-attempt after lunch.  Roxanne Gates, PT, DPT Roxanne Gates 08/06/2018, 10:16 AM

## 2018-08-06 NOTE — Progress Notes (Signed)
[facility]FECT[facility] PROGRESS NOTE Date of Admission:  07/22/2018     ID: CALTON HARSHFIELD is a 62 y.o. male with pneumonia and AMS  Principal Problem:   CAP (community acquired pneumonia) Active Problems:   Uncontrolled type 2 diabetes mellitus with hyperglycemia, with long-term current use of insulin (HCC)   Essential hypertension   Alzheimer's dementia ([place])   GERD (gastroesophageal reflux disease)   Fever   Acute respiratory failure (HCC)  Subjective: Out of unit, wife at bedside. He was awake and talking some this morning  ROS  Unable to obtain  Medications:  Antibiotics Given (last 72 hours)    Date/Time Action Medication Dose Rate   08/03/18 2212 New Bag/Given   ceFEPIme (MAXIPIME) 2 g in sodium chloride 0.9 % 100 mL IVPB 2 g 200 mL/hr   08/03/18 2254 New Bag/Given   metroNIDAZOLE (FLAGYL) IVPB 500 mg 500 mg 100 mL/hr   08/04/18 0431 New Bag/Given   metroNIDAZOLE (FLAGYL) IVPB 500 mg 500 mg 100 mL/hr   08/04/18 1142 New Bag/Given   ceFEPIme (MAXIPIME) 2 g in sodium chloride 0.9 % 100 mL IVPB 2 g 200 mL/hr   08/04/18 1247 New Bag/Given   metroNIDAZOLE (FLAGYL) IVPB 500 mg 500 mg 100 mL/hr   08/04/18 2107 New Bag/Given   metroNIDAZOLE (FLAGYL) IVPB 500 mg 500 mg 100 mL/hr   08/04/18 2305 New Bag/Given   ceFEPIme (MAXIPIME) 2 g in sodium chloride 0.9 % 100 mL IVPB 2 g 200 mL/hr   08/05/18 0448 New Bag/Given   metroNIDAZOLE (FLAGYL) IVPB 500 mg 500 mg 100 mL/hr     . aspirin  81 mg Per Tube Daily  . chlorhexidine gluconate (MEDLINE KIT)  15 mL Mouth Rinse BID  . donepezil  10 mg Oral BID  . enoxaparin (LOVENOX) injection  40 mg Subcutaneous Q24H  . feeding supplement (PRO-STAT SUGAR FREE 64)  30 mL Per Tube Daily  . free water  180 mL Per Tube Q4H  . insulin aspart  0-15 Units Subcutaneous Q4H  . ipratropium-albuterol  3 mL Nebulization TID  . lamoTRIgine  100 mg Per Tube BID  . lidocaine  1 patch Transdermal Q24H  . mouth rinse  15 mL Mouth Rinse 10  times per day  . memantine  10 mg Oral BID  . metoprolol tartrate  25 mg Per Tube BID  . modafinil  100 mg Per NG tube Daily  . pantoprazole sodium  40 mg Per Tube Daily  . sodium chloride flush  10-40 mL Intracatheter Q12H  . thiamine  100 mg Oral Daily    Objective: Vital signs in last 24 hours: Temp:  [98.5 F (36.9 C)-98.6 F (37 C)] 98.5 F (36.9 C) (12/15 1404) Pulse Rate:  [80-87] 83 (12/15 1404) Resp:  [18-20] 18 (12/15 1404) BP: (140-194)/(72-78) 140/72 (12/15 1404) SpO2:  [96 %-100 %] 99 % (12/15 1404) Weight:  [67.5 kg] 67.5 kg (12/15 0406) Physical Exam  Constitutional: , lethargic buy arousable HENT: aniceric,  reactive Mouth/Throat: MM dry Cardiovascular: Normal rate, regular rhythm and normal heart sounds.  Pulmonary/Chest: bil rhonchi and poor air movement Abdominal: Soft. Bowel sounds are normal. He exhibits no distension. There is no tenderness.  Lymphadenopathy: He has no cervical adenopathy.  Neurological: off sedation but remains very lethargic R groin TLC remains in place Skin: Skin is warm and dry. No rash noted. No erythema.  Psychiatric: min responsive Ext trace edema bil  Lab Results Recent Labs    08/04/18 0546  08/05/18 0423 08/06/18 0450  WBC 9.6  --  7.9  HGB 12.9*  --  11.9*  HCT 40.6  --  39.0  NA 147* 144 144  K 3.5 3.4* 3.4*  CL 110 110 109  CO2 '27 28 29  '$ BUN 27* 24* 18  CREATININE 1.08 1.10 0.95    Microbiology: Results for orders placed or performed during the hospital encounter of 07/22/18  Urine culture     Status: None   Collection Time: 07/22/18 11:03 PM  Result Value Ref Range Status   Specimen Description   Final    URINE, RANDOM Performed at [facility], [address] [place], [place] [address]    Special Requests   Final    NONE Performed at [facility], [address] [place], [place] [address]    Culture   Final    NO GROWTH Performed at [facility], [place] [address]  [place], [place] [address]    Report Status 07/24/2018 FINAL  Final  CULTURE, BLOOD (ROUTINE X 2) w Reflex to ID Panel     Status: None   Collection Time: 07/23/18 10:06 AM  Result Value Ref Range Status   Specimen Description BLOOD BLOOD LEFT ARM  Final   Special Requests   Final    BOTTLES DRAWN AEROBIC AND ANAEROBIC Blood Culture adequate volume   Culture   Final    NO GROWTH 5 DAYS Performed at [facility], [address] [place], [place] [address]    Report Status 07/28/2018 FINAL  Final  CULTURE, BLOOD (ROUTINE X 2) w Reflex to ID Panel     Status: None   Collection Time: 07/23/18 10:17 AM  Result Value Ref Range Status   Specimen Description BLOOD BLOOD LEFT WRIST  Final   Special Requests   Final    BOTTLES DRAWN AEROBIC AND ANAEROBIC Blood Culture adequate volume   Culture   Final    NO GROWTH 5 DAYS Performed at [facility], [address] [place], [place] [address]    Report Status 07/28/2018 FINAL  Final  Gram stain     Status: None   Collection Time: 07/26/18  3:11 PM  Result Value Ref Range Status   Specimen Description CSF  Final   Special Requests NONE  Final   Gram Stain   Final    NO ORGANISMS SEEN WBC SEEN Performed at [facility], [address] [place], [place] [address]    Report Status 07/26/2018 FINAL  Final  CSF culture     Status: None   Collection Time: 07/26/18  3:11 PM  Result Value Ref Range Status   Specimen Description   Final    CSF Performed at [facility], [address] [place], [place] [address]    Special Requests   Final    NONE Performed at [facility], [address] [place], [place] [address]    Gram Stain   Final    NO ORGANISMS SEEN WBC SEEN NO RBC SEEN Performed at [facility], [address], [place], [place] [address]    Culture   Final    NO GROWTH 3 DAYS Performed at [facility], [place] [address] [place],  [address]    Report Status  07/30/2018 FINAL  Final  Fungus Culture With Stain     Status: None (Preliminary result)   Collection Time: 07/26/18  3:11 PM  Result Value Ref Range Status  Fungus Stain Final report  Final    Comment: (NOTE) Performed At: [facility] [place], [place] [id number] Rush Farmer MD [id number]    Fungus (Mycology) Culture PENDING  Incomplete   Fungal Source CSF  Final    Comment: Performed at [facility], [address] [place], [place] [address]  Fungus Culture Result     Status: None   Collection Time: 07/26/18  3:11 PM  Result Value Ref Range Status   Result 1 Comment  Final    Comment: (NOTE) KOH/Calcofluor preparation:  no fungus observed. Performed At: [facility] [place], [place] [id number] Rush Farmer MD [id number]   CULTURE, BLOOD (ROUTINE X 2) w Reflex to ID Panel     Status: None   Collection Time: 07/29/18  6:14 AM  Result Value Ref Range Status   Specimen Description BLOOD LEFT FOREARM  Final   Special Requests   Final    BOTTLES DRAWN AEROBIC AND ANAEROBIC Blood Culture adequate volume   Culture   Final    NO GROWTH 5 DAYS Performed at [facility], [address] [place], [place] [address]    Report Status 08/03/2018 FINAL  Final  CULTURE, BLOOD (ROUTINE X 2) w Reflex to ID Panel     Status: None   Collection Time: 07/29/18  6:17 AM  Result Value Ref Range Status   Specimen Description BLOOD LEFT HAND  Final   Special Requests   Final    BOTTLES DRAWN AEROBIC AND ANAEROBIC Blood Culture adequate volume   Culture   Final    NO GROWTH 5 DAYS Performed at [facility], [address] [place], [place] [address]    Report Status 08/03/2018 FINAL  Final  MRSA PCR Screening     Status: None   Collection Time: 07/29/18  4:18 PM  Result Value Ref Range Status   MRSA by PCR NEGATIVE NEGATIVE Final    Comment:        The GeneXpert MRSA Assay (FDA approved for NASAL  specimens only), is one component of a comprehensive MRSA colonization surveillance program. It is not intended to diagnose MRSA infection nor to guide or monitor treatment for MRSA infections. Performed at [facility], [address] [place], [place] [address]   Culture, respiratory     Status: None   Collection Time: 08/01/18  3:27 PM  Result Value Ref Range Status   Specimen Description   Final    TRACHEAL ASPIRATE Performed at [facility], [address] [place], [place] [address]    Special Requests   Final    NONE Performed at [facility], [address] [place], [place] [address]    Gram Stain   Final    MODERATE WBC PRESENT,BOTH PMN AND MONONUCLEAR RARE GRAM POSITIVE COCCI Performed at [facility], [place] [address], [place], [place] [address]    Culture FEW CANDIDA TROPICALIS  Final   Report Status 08/04/2018 FINAL  Final  Culture, respiratory (non-expectorated)     Status: None   Collection Time: 08/03/18 11:12 AM  Result Value Ref Range Status   Specimen Description   Final    TRACHEAL ASPIRATE Performed at [facility], [address], [place], [place] [address]    Special Requests   Final    NONE Performed at [facility], [address] [place], [place] [address]    Gram Stain   Final  NO WBC SEEN RARE SQUAMOUS EPITHELIAL CELLS PRESENT RARE GRAM POSITIVE COCCI    Culture   Final    FEW Consistent with normal respiratory flora. Performed at [facility], [place] [address] [place], [place] [address]    Report Status 08/06/2018 FINAL  Final    Studies/Results: No results found.  Assessment/Plan: JARAN SAINZ is a 62 y.o. male admitted with confusion, weakness, possible PNA. Marland Kitchen Has had MRI x 2, CT of brain with nothing acute. Seen by neurology. EEG with diffuse slowing. Utox neg, Flu neg, HIV neg, RPR neg, TSH neg, UA negative, abg unimpressive. CSF with very mild elevated wbc and  protein.  I do not think he has meningitis or encephalitis and certainly does not have a bacterial meningitis with such a low wbc. He follows with neurology as otpt for transient memory loss and expressive aphasia in past, hx of stroke and seizures.  I suspect current presentation is multifactorial.  12/7 high fever last night, diaphoretic, remains on O2 with increased resp distress today. Remains lethargic 12/8 transferred to unit for resp distress, cxr with bibasilar infiltrates, likely aspiration. wbc elevated.  12/9 temp to 100.9 but HD stable and stable on vent. HSV PCR neg 12/10- febrile again to 102.5 but wbc down  to 10. Echo done and negative. However overall improving, not on pressors, improving on vent settings and wife reports he is waking up more 12/11- Extubated today. no fevers, changed zosyn to cefepime and flagyl. WBC down to 7.8  12/12 no fevers remains extubated. Still lethargic 12/13 - stable since extubated, still not very responsive 12/15 out of unit, . abx stopped 12/13 and no recurrent fevers Prior sputum cxs with candida and Nml Resp flora. ARF is improving.  Recommendations. Continue off abx Please remove R groin TLC- ordered I will sign off but please call for recurrent fevers, leukocytosis or other concerns. Thank you very much for the consult.   Leonel Ramsay   08/06/2018, 2:15 PM

## 2018-08-07 ENCOUNTER — Inpatient Hospital Stay: Payer: PPO

## 2018-08-07 LAB — BLOOD GAS, ARTERIAL
Acid-Base Excess: 9.2 mmol/L — ABNORMAL HIGH (ref 0.0–2.0)
Acid-Base Excess: 9.7 mmol/L — ABNORMAL HIGH (ref 0.0–2.0)
Allens test (pass/fail): POSITIVE — AB
Bicarbonate: 33.5 mmol/L — ABNORMAL HIGH (ref 20.0–28.0)
Bicarbonate: 33.5 mmol/L — ABNORMAL HIGH (ref 20.0–28.0)
FIO2: 0.28
FIO2: 0.5
MECHVT: 500 mL
O2 SAT: 98.4 %
O2 Saturation: 99.2 %
PEEP: 5 cmH2O
Patient temperature: 37
Patient temperature: 37
RATE: 20 resp/min
pCO2 arterial: 41 mmHg (ref 32.0–48.0)
pCO2 arterial: 43 mmHg (ref 32.0–48.0)
pH, Arterial: 7.5 — ABNORMAL HIGH (ref 7.350–7.450)
pH, Arterial: 7.52 — ABNORMAL HIGH (ref 7.350–7.450)
pO2, Arterial: 104 mmHg (ref 83.0–108.0)
pO2, Arterial: 128 mmHg — ABNORMAL HIGH (ref 83.0–108.0)

## 2018-08-07 LAB — GLUCOSE, CAPILLARY
GLUCOSE-CAPILLARY: 364 mg/dL — AB (ref 70–99)
Glucose-Capillary: 196 mg/dL — ABNORMAL HIGH (ref 70–99)
Glucose-Capillary: 222 mg/dL — ABNORMAL HIGH (ref 70–99)
Glucose-Capillary: 224 mg/dL — ABNORMAL HIGH (ref 70–99)
Glucose-Capillary: 244 mg/dL — ABNORMAL HIGH (ref 70–99)
Glucose-Capillary: 281 mg/dL — ABNORMAL HIGH (ref 70–99)
Glucose-Capillary: 294 mg/dL — ABNORMAL HIGH (ref 70–99)
Glucose-Capillary: 296 mg/dL — ABNORMAL HIGH (ref 70–99)
Glucose-Capillary: 373 mg/dL — ABNORMAL HIGH (ref 70–99)
Glucose-Capillary: 407 mg/dL — ABNORMAL HIGH (ref 70–99)

## 2018-08-07 LAB — BASIC METABOLIC PANEL
Anion gap: 7 (ref 5–15)
BUN: 20 mg/dL (ref 8–23)
CO2: 29 mmol/L (ref 22–32)
Calcium: 8 mg/dL — ABNORMAL LOW (ref 8.9–10.3)
Chloride: 108 mmol/L (ref 98–111)
Creatinine, Ser: 0.88 mg/dL (ref 0.61–1.24)
GFR calc Af Amer: >60 mL/min (ref >60)
GFR calc non Af Amer: >60 mL/min (ref >60)
GLUCOSE: 395 mg/dL — AB (ref 70–99)
Potassium: 3.3 mmol/L — ABNORMAL LOW (ref 3.5–5.1)
Sodium: 144 mmol/L (ref 135–145)

## 2018-08-07 LAB — CBC
HCT: 38.9 % — ABNORMAL LOW (ref 39.0–52.0)
Hemoglobin: 11.9 g/dL — ABNORMAL LOW (ref 13.0–17.0)
MCH: 28.9 pg (ref 26.0–34.0)
MCHC: 30.6 g/dL (ref 30.0–36.0)
MCV: 94.4 fL (ref 80.0–100.0)
Platelets: 276 10*3/uL (ref 150–400)
RBC: 4.12 MIL/uL — ABNORMAL LOW (ref 4.22–5.81)
RDW: 15.6 % — AB (ref 11.5–15.5)
WBC: 8.3 10*3/uL (ref 4.0–10.5)
nRBC: 0 % (ref 0.0–0.2)

## 2018-08-07 MED ORDER — FENTANYL BOLUS VIA INFUSION
25.0000 ug | INTRAVENOUS | Status: DC | PRN
  Administered 2018-08-09: 25 ug via INTRAVENOUS
  Filled 2018-08-07: qty 25

## 2018-08-07 MED ORDER — FENTANYL CITRATE (PF) 100 MCG/2ML IJ SOLN
INTRAMUSCULAR | Status: AC
  Administered 2018-08-07: 50 ug via INTRAVENOUS
  Filled 2018-08-07: qty 2

## 2018-08-07 MED ORDER — FENTANYL 2500MCG IN NS 250ML (10MCG/ML) PREMIX INFUSION
INTRAVENOUS | Status: AC
  Filled 2018-08-07: qty 250

## 2018-08-07 MED ORDER — ETOMIDATE 2 MG/ML IV SOLN
INTRAVENOUS | Status: AC
  Administered 2018-08-07: 20 mg
  Filled 2018-08-07: qty 10

## 2018-08-07 MED ORDER — MODAFINIL 100 MG PO TABS
200.0000 mg | ORAL_TABLET | Freq: Every day | ORAL | Status: DC
  Filled 2018-08-07 (×2): qty 1

## 2018-08-07 MED ORDER — ROCURONIUM BROMIDE 50 MG/5ML IV SOLN
INTRAVENOUS | Status: AC
  Administered 2018-08-07: 10 mg
  Filled 2018-08-07: qty 1

## 2018-08-07 MED ORDER — NOREPINEPHRINE 4 MG/250ML-% IV SOLN
0.0000 ug/min | INTRAVENOUS | Status: DC
  Administered 2018-08-07: 5 ug/min via INTRAVENOUS

## 2018-08-07 MED ORDER — FENTANYL 2500MCG IN NS 250ML (10MCG/ML) PREMIX INFUSION
25.0000 ug/h | INTRAVENOUS | Status: DC
  Administered 2018-08-07: 50 ug/h via INTRAVENOUS
  Administered 2018-08-09: 120 ug/h via INTRAVENOUS
  Filled 2018-08-07: qty 250

## 2018-08-07 MED ORDER — INSULIN DETEMIR 100 UNIT/ML ~~LOC~~ SOLN
10.0000 [IU] | Freq: Two times a day (BID) | SUBCUTANEOUS | Status: DC
  Administered 2018-08-07 – 2018-08-09 (×5): 10 [IU] via SUBCUTANEOUS
  Filled 2018-08-07 (×7): qty 0.1

## 2018-08-07 MED ORDER — POTASSIUM CHLORIDE 20 MEQ/15ML (10%) PO SOLN
40.0000 meq | Freq: Once | ORAL | Status: AC
  Administered 2018-08-07: 40 meq
  Filled 2018-08-07: qty 30

## 2018-08-07 MED ORDER — FENTANYL CITRATE (PF) 100 MCG/2ML IJ SOLN
INTRAMUSCULAR | Status: AC
  Administered 2018-08-07: 50 ug
  Filled 2018-08-07: qty 2

## 2018-08-07 MED ORDER — INSULIN ASPART 100 UNIT/ML ~~LOC~~ SOLN
5.0000 [IU] | Freq: Once | SUBCUTANEOUS | Status: AC
  Administered 2018-08-07: 5 [IU] via SUBCUTANEOUS
  Filled 2018-08-07: qty 1

## 2018-08-07 MED ORDER — FENTANYL CITRATE (PF) 100 MCG/2ML IJ SOLN
50.0000 ug | Freq: Once | INTRAMUSCULAR | Status: AC
  Administered 2018-08-07: 50 ug via INTRAVENOUS

## 2018-08-07 NOTE — Progress Notes (Signed)
Inpatient Diabetes Program Recommendations  AACE/ADA: New Consensus Statement on Inpatient Glycemic Control (2015)  Target Ranges:  Prepandial:   less than 140 mg/dL      Peak postprandial:   less than 180 mg/dL (1-2 hours)      Critically ill patients:  140 - 180 mg/dL   Lab Results  Component Value Date   GLUCAP 296 (H) 08/07/2018   HGBA1C 8.9 (H) 04/04/2016    Review of Glycemic Control Results for NATE, COMMON (MRN [id number]) as of 08/07/2018 12:42  Ref. Range 08/07/2018 00:08 08/07/2018 04:59 08/07/2018 07:44 08/07/2018 11:41  Glucose-Capillary Latest Ref Range: 70 - 99 mg/dL 224 (H) 364 (H) 281 (H) 296 (H)   Diabetes history: DM 2 Outpatient Diabetes medications:  NPH 32 units bid, Novolin R-34 units tid with meals Current orders for Inpatient glycemic control:  Novolog moderate q 4 hours Inpatient Diabetes Program Recommendations:   Please add Levemir 10 units bid.    Thanks,  Adah Perl, RN, BC-ADM Inpatient Diabetes Coordinator Pager [phone] (8a-5p)

## 2018-08-07 NOTE — Progress Notes (Signed)
HPI  62 year old gentleman with an underlying past medical history of congestive heart failure, coronary artery disease, chronic renal failure, diabetes, hypertension, hyperlipidemia, gastroesophageal reflux disease, prior history of pancreatic cancer was brought in by EMS with complaints of weakness and progressive altered mental status.  She was admitted to the hospital with a diagnosis of community-acquired pneumonia and encephalopathy.  Felt to be metabolic in nature, neurology was consulted, negative work-up to include lumbar puncture and brain MRI.  On 12/7 he developed progressive respiratory failure with presumptive aspiration.  He was brought to the intensive care unit and started on Bipap. Despite Bipap he developed worsening acute respiratory failure requiring mechanical intubation on 12/8.  He was successfully extubated on 12/11 and transferred to the medsurg unit on 12/13.  He required transfer back to ICU on 12/16 due to acute on chronic respiratory failure likely secondary to aspiration requiring mechanical intubation.   SUBJECTIVE Unable to assess pt mechanically intubated   BP (!) 155/77 (BP Location: Left Arm)   Pulse 90   Temp 98.8 F (37.1 C) (Oral)   Resp (!) 22   Ht _0  (1.676 m)   Wt 67.5 kg   SpO2 94%   BMI 24.02 kg/m    REVIEW OF SYSTEMS Unable to assess pt mechanically intubated   PHYSICAL EXAMINATION: GENERAL: acutely ill appearing male, NAD mechanically intubated  ENT: supple, no JVD  PULMONARY: rhonchi throughout, even, non labored  CARDIOVASCULAR: nsr, rrr, no R/G  GASTROINTESTINAL: +BS x4, soft, obese, non distended  MUSCULOSKELETAL: left upper extremity contracted NEUROLOGIC: sedated, not following commands, PERRL  SKIN: chronic vascular skin discoloration, no rashes or lesions present   INTAKE/OUTPUT  Intake/Output Summary (Last 24 hours) at 08/07/2018 2221 Last data filed at 08/07/2018 2014 Gross per 24 hour  Intake 13.61 ml  Output 1600 ml   Net -1586.39 ml    LABS  CBC Recent Labs  Lab 08/04/18 0546 08/06/18 0450 08/07/18 0515  WBC 9.6 7.9 8.3  HGB 12.9* 11.9* 11.9*  HCT 40.6 39.0 38.9*  PLT 327 281 276   Coag's No results for input(s): APTT, INR in the last 168 hours. BMET Recent Labs  Lab 08/05/18 0423 08/06/18 0450 08/07/18 0515  NA 144 144 144  K 3.4* 3.4* 3.3*  CL 110 109 108  CO2 _1 BUN 24* 18 20  CREATININE 1.10 0.95 0.88  GLUCOSE 383* 392* 395*   Electrolytes Recent Labs  Lab 08/03/18 0431 08/04/18 0546 08/05/18 0423 08/06/18 0450 08/07/18 0515  CALCIUM 7.9* 8.3* 7.9* 7.9* 8.0*  MG 2.0 2.4 2.2  --   --   PHOS 2.4* 2.4* 2.3*  --   --    Sepsis Markers No results for input(s): LATICACIDVEN, PROCALCITON, O2SATVEN in the last 168 hours. ABG Recent Labs  Lab 08/07/18 1338  PHART 7.50*  PCO2ART 43  PO2ART 104   Liver Enzymes Recent Labs  Lab 08/03/18 0431 08/06/18 0450  AST 22 22  ALT 21 22  ALKPHOS 100 105  BILITOT 0.8 0.4  ALBUMIN 2.5* 2.5*   Cardiac Enzymes No results for input(s): TROPONINI, PROBNP in the last 168 hours. Glucose Recent Labs  Lab 08/07/18 0459 08/07/18 0744 08/07/18 1141 08/07/18 1634 08/07/18 2002 08/07/18 2200  GLUCAP 364* 281* 296* 407* 244* 196*     Recent Results (from the past 240 hour(s))  CULTURE, BLOOD (ROUTINE X 2) w Reflex to ID Panel     Status: None   Collection Time: 07/29/18  6:14 AM  Result Value Ref Range Status   Specimen Description BLOOD LEFT FOREARM  Final   Special Requests   Final    BOTTLES DRAWN AEROBIC AND ANAEROBIC Blood Culture adequate volume   Culture   Final    NO GROWTH 5 DAYS Performed at [facility], [address] [place], [place] [address]    Report Status 08/03/2018 FINAL  Final  CULTURE, BLOOD (ROUTINE X 2) w Reflex to ID Panel     Status: None   Collection Time: 07/29/18  6:17 AM  Result Value Ref Range Status   Specimen Description BLOOD LEFT HAND  Final   Special Requests    Final    BOTTLES DRAWN AEROBIC AND ANAEROBIC Blood Culture adequate volume   Culture   Final    NO GROWTH 5 DAYS Performed at [facility], [address] [place], [place] [address]    Report Status 08/03/2018 FINAL  Final  MRSA PCR Screening     Status: None   Collection Time: 07/29/18  4:18 PM  Result Value Ref Range Status   MRSA by PCR NEGATIVE NEGATIVE Final    Comment:        The GeneXpert MRSA Assay (FDA approved for NASAL specimens only), is one component of a comprehensive MRSA colonization surveillance program. It is not intended to diagnose MRSA infection nor to guide or monitor treatment for MRSA infections. Performed at [facility], [address] [place], [place] [address]   Culture, respiratory     Status: None   Collection Time: 08/01/18  3:27 PM  Result Value Ref Range Status   Specimen Description   Final    TRACHEAL ASPIRATE Performed at [facility], [address], [place], [place] [address]    Special Requests   Final    NONE Performed at [facility], [address] [place], [place] [address]    Gram Stain   Final    MODERATE WBC PRESENT,BOTH PMN AND MONONUCLEAR RARE GRAM POSITIVE COCCI Performed at [facility], [place] [address], [place], [place] [address]    Culture FEW CANDIDA TROPICALIS  Final   Report Status 08/04/2018 FINAL  Final  Culture, respiratory (non-expectorated)     Status: None   Collection Time: 08/03/18 11:12 AM  Result Value Ref Range Status   Specimen Description   Final    TRACHEAL ASPIRATE Performed at [facility], [address], [place], [place] [address]    Special Requests   Final    NONE Performed at [facility], [place], [place] [address]    Gram Stain   Final    NO WBC SEEN RARE SQUAMOUS EPITHELIAL CELLS PRESENT RARE GRAM POSITIVE COCCI    Culture   Final    FEW Consistent with normal respiratory flora. Performed at [facility], [place] [address], [place], [place] [address]    Report Status 08/06/2018 FINAL  Final    MEDICATIONS   Current Facility-Administered Medications:  .  0.9 %  sodium chloride infusion, , Intravenous, PRN, Lance Coon, MD, Stopped at 08/06/18 2337 .  acetaminophen (TYLENOL) tablet 650 mg, 650 mg, Oral, Q6H PRN, 650 mg at 08/01/18 0811 **OR** acetaminophen (TYLENOL) suppository 650 mg, 650 mg, Rectal, Q6H PRN, Lance Coon, MD, 650 mg at 07/29/18 0038 .  albuterol (PROVENTIL) (2.5 MG/3ML) 0.083% nebulizer solution 2.5 mg, 2.5 mg, Nebulization, Q2H PRN, Gouru, Aruna, MD .  aspirin chewable tablet 81 mg, 81  mg, Per Tube, Daily, Pernell Dupre, RPH, 81 mg at 08/07/18 0825 .  chlorhexidine gluconate (MEDLINE KIT) (PERIDEX) 0.12 % solution 15 mL, 15 mL, Mouth Rinse, BID, Conforti, John, DO, 15 mL at 08/07/18 0826 .  donepezil (ARICEPT) tablet 10 mg, 10 mg, Oral, BID, Lance Coon, MD, 10 mg at 08/07/18 4562 .  enoxaparin (LOVENOX) injection 40 mg, 40 mg, Subcutaneous, Q24H, Tressia Miners, Radhika, MD, 40 mg at 08/07/18 0825 .  etomidate (AMIDATE) 2 MG/ML injection, , , ,  .  feeding supplement (OSMOLITE 1.5 CAL) liquid 1,000 mL, 1,000 mL, Per Tube, Continuous, Tyler Pita, MD, Last Rate: 55 mL/hr at 08/07/18 0506, 1,000 mL at 08/07/18 0506 .  feeding supplement (PRO-STAT SUGAR FREE 64) liquid 30 mL, 30 mL, Per Tube, Daily, Tyler Pita, MD, 30 mL at 08/07/18 0839 .  fentaNYL (SUBLIMAZE) 100 MCG/2ML injection, , , ,  .  free water 180 mL, 180 mL, Per Tube, Q4H, Tyler Pita, MD, 180 mL at 08/07/18 2000 .  guaiFENesin-dextromethorphan (ROBITUSSIN DM) 100-10 MG/5ML syrup 5 mL, 5 mL, Oral, Q4H PRN, Lance Coon, MD .  insulin aspart (novoLOG) injection 0-15 Units, 0-15 Units, Subcutaneous, Q4H, Tyler Pita, MD, 5 Units at 08/07/18 2053 .  insulin detemir (LEVEMIR) injection 10 Units, 10 Units, Subcutaneous, BID, Dustin Flock, MD, 10 Units at 08/07/18 1418 .   ipratropium-albuterol (DUONEB) 0.5-2.5 (3) MG/3ML nebulizer solution 3 mL, 3 mL, Nebulization, TID, Gouru, Aruna, MD, 3 mL at 08/07/18 1930 .  labetalol (NORMODYNE,TRANDATE) injection 10 mg, 10 mg, Intravenous, Q6H PRN, Gouru, Aruna, MD, 10 mg at 08/02/18 2319 .  lacosamide (VIMPAT) 200 mg in sodium chloride 0.9 % 25 mL IVPB, 200 mg, Intravenous, Q12H, Arnaldo Natal, MD, Stopped at 08/07/18 1422 .  lamoTRIgine (LAMICTAL) tablet 100 mg, 100 mg, Per Tube, BID, Hallaji, Sheema M, RPH, 100 mg at 08/07/18 0824 .  lidocaine (LIDODERM) 5 % 1 patch, 1 patch, Transdermal, Q24H, Gladstone Lighter, MD, 1 patch at 08/07/18 1418 .  MEDLINE mouth rinse, 15 mL, Mouth Rinse, 10 times per day, Conforti, John, DO, 15 mL at 08/07/18 1821 .  memantine (NAMENDA) tablet 10 mg, 10 mg, Oral, BID, Lance Coon, MD, 10 mg at 08/07/18 0824 .  metoprolol tartrate (LOPRESSOR) tablet 25 mg, 25 mg, Per Tube, BID, Hallaji, Sheema M, RPH, 25 mg at 08/07/18 0824 .  [START ON 08/08/2018] modafinil (PROVIGIL) tablet 200 mg, 200 mg, Per NG tube, Daily, Dustin Flock, MD .  ondansetron (ZOFRAN) tablet 4 mg, 4 mg, Oral, Q6H PRN **OR** ondansetron (ZOFRAN) injection 4 mg, 4 mg, Intravenous, Q6H PRN, Lance Coon, MD .  pantoprazole sodium (PROTONIX) 40 mg/20 mL oral suspension 40 mg, 40 mg, Per Tube, Daily, Hallaji, Sheema M, RPH, 40 mg at 08/06/18 1710 .  phenol (CHLORASEPTIC) mouth spray 1 spray, 1 spray, Mouth/Throat, PRN, Lance Coon, MD, 1 spray at 08/02/18 2154 .  rocuronium (ZEMURON) 50 MG/5ML injection, , , ,  .  sodium chloride flush (NS) 0.9 % injection 10-40 mL, 10-40 mL, Intracatheter, Q12H, Braelynne Garinger G, NP, 10 mL at 08/07/18 0825 .  sodium chloride flush (NS) 0.9 % injection 10-40 mL, 10-40 mL, Intracatheter, PRN, Awilda Bill, NP .  thiamine (VITAMIN B-1) tablet 100 mg, 100 mg, Oral, Daily, Tyler Pita, MD, 100 mg at 08/07/18 0825   ASSESSMENT AND PLAN  Acute hypoxemic respiratory failure cause  most likely aspiration in the setting of acute encephalopathy Full vent support-vent setting reviewed and established SBT  once all parameters met VAP bundle implemented Scheduled and prn bronchodilator therapy  Follow ABG's   Cardiac murmur-no AS, EF 60% to 65% per echocardiogram 07/31/18.  Acute renal failure-resolved Trend BMP  Replace electrolytes as indicated Monitor UOP Avoid nephrotoxic medications   Hyperglycemia Continue SSI and levemir  If CBG's remain elevated will start insulin gtt  Dysphagia requiring dobhoff placement by IR on 08/04/18, however KUB on 12/16 shows nasogastric tube projecting GE junction Tube feeds on hold Will need nasogastric tube redirected by IR  Toxic metabolic encephalopathy with previous imaging studies and lumbar puncture negative, evaluated by neurology.  Mechanical intubation discomfort/pain.  Hx of seizure disorder  Maintain RASS goal 0 to -1 Fentanyl gtt to maintain RASS goal  Stat CT Head pending   Marda Stalker, [place] Pager [phone] (please enter 7 digits) PCCM Consult Pager [phone] (please enter 7 digits)

## 2018-08-07 NOTE — Progress Notes (Signed)
[facility] [place] NAME: Maurice Patterson    MR#:  [id number]  DATE OF BIRTH:  1955-10-26  SUBJECTIVE:  CHIEF COMPLAINT: Patient was awake yesterday but now very drowsy  REVIEW OF SYSTEMS:  Review of system unobtainable as the patient unable to communicate DRUG ALLERGIES:   Allergies  Allergen Reactions  . Hydrocodone Anaphylaxis  . Morphine Other (See Comments)    Loss of memory  . Ambien [Zolpidem] Other (See Comments)    delirium    . Brilinta [Ticagrelor] Other (See Comments)    Stroke   . Flexeril [Cyclobenzaprine] Other (See Comments)    delerium   . Flunitrazepam Other (See Comments)    ROHYPNOL (hallucinations)  . Haldol [Haloperidol Lactate] Other (See Comments)    delerium   . Levetiracetam Diarrhea and Other (See Comments)    Unable to walk  . Lorazepam Hives  . Risperdal [Risperidone] Other (See Comments)    Delirium   . Trazodone Other (See Comments)    Delirium Can take in low doses   . Benadryl [Diphenhydramine Hcl (Sleep)] Rash  . Penicillins Rash    Mouth ulcers Has patient had a PCN reaction causing immediate rash, facial/tongue/throat swelling, SOB or lightheadedness with hypotension: Yes Has patient had a PCN reaction causing severe rash involving mucus membranes or skin necrosis: No Has patient had a PCN reaction that required hospitalization No Has patient had a PCN reaction occurring within the last 10 years: Yes If all of the above answers are "NO", then may proceed with Cephalosporin use.    VITALS:  Blood pressure (!) 155/99, pulse 83, temperature 98.7 F (37.1 C), temperature source Oral, resp. rate 18, height 5\' 6"  (1.676 m), weight 67.5 kg, SpO2 100 %.  PHYSICAL EXAMINATION:  Physical Exam  GENERAL:  62 y.o.-year-old patient lying in the bed chronically ill-appearing EYES: Pupils equal, round, reactive to light and accommodation. No scleral icterus. HEENT: Head atraumatic,  normocephalic. Oropharynx and nasopharynx clear.  NECK:  Supple, no jugular venous distention. No thyroid enlargement, no tenderness.  LUNGS: Normal breath sounds bilaterally, increased rate, no wheezing, some crepitation.  CARDIOVASCULAR: S1, S2 normal.  Systolic murmur is present, no rubs, or gallops.  ABDOMEN: Soft, nontender, nondistended. Bowel sounds present.  EXTREMITIES: No pedal edema, cyanosis, or clubbing.  NEUROLOGIC: stiffer left upper extremity, that is chronic for this patient PSYCHIATRIC:   Able to follow command SKIN: No obvious rash, lesion, or ulcer.    LABORATORY PANEL:   CBC Recent Labs  Lab 08/07/18 0515  WBC 8.3  HGB 11.9*  HCT 38.9*  PLT 276   ------------------------------------------------------------------------------------------------------------------  Chemistries  Recent Labs  Lab 08/05/18 0423 08/06/18 0450 08/07/18 0515  NA 144 144 144  K 3.4* 3.4* 3.3*  CL 110 109 108  CO2 28 29 29   GLUCOSE 383* 392* 395*  BUN 24* 18 20  CREATININE 1.10 0.95 0.88  CALCIUM 7.9* 7.9* 8.0*  MG 2.2  --   --   AST  --  22  --   ALT  --  22  --   ALKPHOS  --  105  --   BILITOT  --  0.4  --    ------------------------------------------------------------------------------------------------------------------  Cardiac Enzymes No results for input(s): TROPONINI in the last 168 hours. ------------------------------------------------------------------------------------------------------------------  RADIOLOGY:  No results found.  EKG:   Orders placed or performed during the hospital encounter of 07/22/18  . EKG 12-Lead  . EKG 12-Lead  . EKG  12-Lead  . EKG 12-Lead  . ED EKG  . ED EKG    ASSESSMENT AND PLAN:   62 year old male with past medical history significant for CAD, CHF, CKD, dementia, diabetes, hypertension and seizure disorder presents to hospital secondary to worsening weakness and poor appetite  Acute respiratory failure with  hypoxemia Possible aspiration pneumonia on broad-spectrum IV antibiotics Got extubated 08/02/2018 Speech therapy evaluation once patient more awake   -Acute toxic metabolic encephalopathy with past medical history of HSV encephalitis CT head is negative LP done and no evidence of bacterial meningitis with normal WBC and elevated glucose and protein HSV PCR test is negative and acyclovir discontinued per ID Appreciate ID and neurology recommendations Gram stain is negative.  Cultures on CSF is negative. Respiratory culture collected on 08/03/2018 revealed rare  gram-positive cocci Initial EEG without seizure activity but generalized slowing.  Repeat EEG - no seizure like activities. Continue Provigil mEntal status now worsening I have asked neurology to reevaluate Check ABG make sure patient is not retaining CO2  -Aspiration pneumonia- ac respi failure with hypoxia Started on broad-spectrum antibiotics Zosyn for aspiration pneumonia 07/31/2018-100.9 08/01/2018-temperature 102.5 but leukocytosis is trending down ID has recommended to discontinue Zosyn and started patient on cefepime and Flagyl.   repeat blood cultures negative MRSA PCR negative.    --  Hypokalemia- replaced.  -Hyponatremia improved   -  Diabetes mellitus on insulin sliding scale, discontinued insulin drip.  -  History of seizure disorder/myoclonic jerks-on Lamictal. -According to wife, patient does not have any history of seizures -EEG with generalized slowing, no seizure-like activity.  Empirically Vimpat was added by neurology  -  DVT prophylaxis-on Lovenox   -. Nutrition- Failed attempt for PICC line initially. Awaiting speech therapy assessment, currently n.p.o.     All the records are reviewed and case discussed with Care Management/Social Workerr. Management plans discussed with the patient, family and they are in agreement.  CODE STATUS: Full Code  TOTAL TIME TAKING CARE OF THIS PATIENT: 35 minutes.    POSSIBLE D/C IN 2 DAYS, DEPENDING ON CLINICAL CONDITION.   Dustin Flock M.D on 08/07/2018 at 1:36 PM  Between 7am to 6pm - Pager - [phone]  After 6pm go to [email or web address] - password [email or web address] [facility]  Office  [phone]  CC: Primary care physician; [name], MD

## 2018-08-07 NOTE — Progress Notes (Signed)
SLP Cancellation Note  Patient Details Name: DERRIN CURREY MRN: [id number] DOB: 1956-03-11   Cancelled treatment:       Reason Eval/Treat Not Completed: Fatigue/lethargy limiting ability to participate;Patient's level of consciousness  The patient did not rouse to verbal/tactile stimuli and is not appropriate for MBS at this time.  Leroy Sea, MS/CCC- SLP  Lou Miner 08/07/2018, 10:45 AM

## 2018-08-07 NOTE — Care Management Important Message (Signed)
Copy of signed IM left with patient in room.  

## 2018-08-07 NOTE — Progress Notes (Signed)
RT to patient bedside for scheduled breathing treatment, Patient HR 92, SAT at 92% on 2L [place]. Patient experiencing diaphoresis, RN called and made aware of patient condition. FIO2 increased to 3L to help support Oxygen. Will continue to monitor.

## 2018-08-07 NOTE — Progress Notes (Signed)
Called to bedside by nursing at family's request stating that the patient was minimally to nonresponsive, and seemed to be having difficulty breathing.  He was transferred out of the ICU recently, after stay there with intubation.  He was intubated few days ago, and per family's report yesterday he was alert and talking and interacting.  Per nursing report as of this morning he was less responsive, by this afternoon not responsive at all.  On this writer's examination patient was retracting, using accessory muscles to breathe, and taking very shallow breaths.  Chest x-ray did not show any acute pathology explaining his mental status and breathing.  ABG showed sufficient oxygenation, with some mild alkalosis.  It is unclear to me at this time why he is in his current condition, and why he has decompensated.  He is currently getting tube feeds, so aspiration is always a concern.  However, it is clear that he needs greater respiratory support, and so I have put in the order to transfer him back to the ICU, with a plan to likely re-intubate.  Jacqulyn Bath [facility] 08/07/2018, 9:45 PM  Note:  This document was prepared using Dragon voice recognition software and may include unintentional dictation errors.

## 2018-08-07 NOTE — Progress Notes (Signed)
PT Cancellation Note  Patient Details Name: LEYTON MAGOON MRN: [id number] DOB: 11-04-1955   Cancelled Treatment:    Reason Eval/Treat Not Completed: Patient's level of consciousness(Evaluation re-attempted.  Patient remains lethargic and unable to participate with PT evaluation (x3 consecutive days).  Snoring loudly, minimally responsive to sternal rub.  Unable to tolerate or participate with PT intervention at this time.  Per guidelines, will discontinue order at this time.  Please re-consult as alertness improves and patient medically appropriate, able to participate with mobility assessment.)   Jhordan Mckibben H. Owens Shark, PT, DPT, NCS 08/07/18, 11:32 AM [phone]

## 2018-08-08 ENCOUNTER — Inpatient Hospital Stay: Payer: PPO

## 2018-08-08 DIAGNOSIS — F0280 Dementia in other diseases classified elsewhere without behavioral disturbance: Secondary | ICD-10-CM

## 2018-08-08 DIAGNOSIS — R1310 Dysphagia, unspecified: Secondary | ICD-10-CM

## 2018-08-08 DIAGNOSIS — G301 Alzheimer's disease with late onset: Secondary | ICD-10-CM

## 2018-08-08 LAB — URINALYSIS, COMPLETE (UACMP) WITH MICROSCOPIC
BILIRUBIN URINE: NEGATIVE
Bacteria, UA: NONE SEEN
Glucose, UA: 50 mg/dL — AB
Hgb urine dipstick: NEGATIVE
KETONES UR: NEGATIVE mg/dL
Leukocytes, UA: NEGATIVE
Nitrite: NEGATIVE
Protein, ur: 30 mg/dL — AB
Specific Gravity, Urine: 1.031 — ABNORMAL HIGH (ref 1.005–1.030)
pH: 5 (ref 5.0–8.0)

## 2018-08-08 LAB — BLOOD GAS, ARTERIAL
Acid-Base Excess: 4.5 mmol/L — ABNORMAL HIGH (ref 0.0–2.0)
Bicarbonate: 30.4 mmol/L — ABNORMAL HIGH (ref 20.0–28.0)
FIO2: 0.4
MECHVT: 500 mL
O2 Saturation: 96.6 %
PEEP: 5 cmH2O
Patient temperature: 37
RATE: 16 resp/min
pCO2 arterial: 49 mmHg — ABNORMAL HIGH (ref 32.0–48.0)
pH, Arterial: 7.4 (ref 7.350–7.450)
pO2, Arterial: 87 mmHg (ref 83.0–108.0)

## 2018-08-08 LAB — CBC WITH DIFFERENTIAL/PLATELET
Abs Immature Granulocytes: 0.09 10*3/uL — ABNORMAL HIGH (ref 0.00–0.07)
Basophils Absolute: 0.1 10*3/uL (ref 0.0–0.1)
Basophils Relative: 0 %
EOS ABS: 0.1 10*3/uL (ref 0.0–0.5)
Eosinophils Relative: 1 %
HCT: 39.8 % (ref 39.0–52.0)
Hemoglobin: 12.3 g/dL — ABNORMAL LOW (ref 13.0–17.0)
Immature Granulocytes: 1 %
Lymphocytes Relative: 20 %
Lymphs Abs: 2.7 10*3/uL (ref 0.7–4.0)
MCH: 29.5 pg (ref 26.0–34.0)
MCHC: 30.9 g/dL (ref 30.0–36.0)
MCV: 95.4 fL (ref 80.0–100.0)
Monocytes Absolute: 1.1 10*3/uL — ABNORMAL HIGH (ref 0.1–1.0)
Monocytes Relative: 8 %
Neutro Abs: 9.6 10*3/uL — ABNORMAL HIGH (ref 1.7–7.7)
Neutrophils Relative %: 70 %
Platelets: 322 10*3/uL (ref 150–400)
RBC: 4.17 MIL/uL — ABNORMAL LOW (ref 4.22–5.81)
RDW: 15.8 % — ABNORMAL HIGH (ref 11.5–15.5)
WBC: 13.6 10*3/uL — ABNORMAL HIGH (ref 4.0–10.5)
nRBC: 0 % (ref 0.0–0.2)

## 2018-08-08 LAB — GLUCOSE, CAPILLARY
Glucose-Capillary: 255 mg/dL — ABNORMAL HIGH (ref 70–99)
Glucose-Capillary: 272 mg/dL — ABNORMAL HIGH (ref 70–99)
Glucose-Capillary: 258 mg/dL — ABNORMAL HIGH (ref 70–99)
Glucose-Capillary: 189 mg/dL — ABNORMAL HIGH (ref 70–99)
Glucose-Capillary: 105 mg/dL — ABNORMAL HIGH (ref 70–99)
Glucose-Capillary: 164 mg/dL — ABNORMAL HIGH (ref 70–99)
Glucose-Capillary: 219 mg/dL — ABNORMAL HIGH (ref 70–99)

## 2018-08-08 LAB — BASIC METABOLIC PANEL
Anion gap: 4 — ABNORMAL LOW (ref 5–15)
BUN: 22 mg/dL (ref 8–23)
CALCIUM: 8.1 mg/dL — AB (ref 8.9–10.3)
CO2: 30 mmol/L (ref 22–32)
Chloride: 112 mmol/L — ABNORMAL HIGH (ref 98–111)
Creatinine, Ser: 1.1 mg/dL (ref 0.61–1.24)
GFR calc Af Amer: >60 mL/min (ref >60)
GFR calc non Af Amer: >60 mL/min (ref >60)
Glucose, Bld: 283 mg/dL — ABNORMAL HIGH (ref 70–99)
Potassium: 3.6 mmol/L (ref 3.5–5.1)
Sodium: 146 mmol/L — ABNORMAL HIGH (ref 135–145)

## 2018-08-08 LAB — PHOSPHORUS: Phosphorus: 3.1 mg/dL (ref 2.5–4.6)

## 2018-08-08 MED ORDER — FREE WATER
30.0000 mL | Status: DC
  Administered 2018-08-08 – 2018-08-10 (×11): 30 mL

## 2018-08-08 MED ORDER — BUDESONIDE 0.5 MG/2ML IN SUSP
0.5000 mg | Freq: Two times a day (BID) | RESPIRATORY_TRACT | Status: DC
  Administered 2018-08-08 – 2018-08-13 (×11): 0.5 mg via RESPIRATORY_TRACT
  Filled 2018-08-08 (×12): qty 2

## 2018-08-08 MED ORDER — VITAL AF 1.2 CAL PO LIQD
1000.0000 mL | ORAL | Status: DC
  Administered 2018-08-08 – 2018-08-14 (×7): 1000 mL

## 2018-08-08 MED ORDER — IPRATROPIUM-ALBUTEROL 0.5-2.5 (3) MG/3ML IN SOLN
3.0000 mL | RESPIRATORY_TRACT | Status: DC
  Administered 2018-08-08 – 2018-08-14 (×35): 3 mL via RESPIRATORY_TRACT
  Filled 2018-08-08 (×36): qty 3

## 2018-08-08 MED ORDER — VITAMIN B-1 100 MG PO TABS
100.0000 mg | ORAL_TABLET | Freq: Every day | ORAL | Status: DC
  Administered 2018-08-09 – 2018-08-21 (×13): 100 mg
  Filled 2018-08-08 (×13): qty 1

## 2018-08-08 MED ORDER — SODIUM CHLORIDE 0.9 % IV SOLN
3.0000 g | Freq: Three times a day (TID) | INTRAVENOUS | Status: DC
  Administered 2018-08-08 – 2018-08-09 (×4): 3 g via INTRAVENOUS
  Filled 2018-08-08 (×8): qty 3

## 2018-08-08 NOTE — Progress Notes (Signed)
Daily Progress Note   Patient Name: Maurice Patterson       Date: 08/08/2018 DOB: 08/15/1956  Age: 62 y.o. MRN#: [id number] Attending Physician: Dustin Flock, MD Primary Care Physician: [name], MD Admit Date: 07/22/2018  Reason for Consultation/Follow-up: Establishing goals of care.   Palliative Medicine Team was re-consulted for goals of care as the patient required re-intubation after two weeks of hospitalization.  Subjective: Patient is intubated and on fentanyl and vimpat.  He does not wake when I speak with him or examine him.  I called his daughter and left a message requesting a meeting either 12/18 or 12/19  to discuss how the patient is doing.  I received a call back from the patient's wife Maurice Patterson.  Maurice Patterson was very upset that I called and asked her to come to the hospital.  She wanted to know what I had to say over the telephone.  She did not want to meet with me in person.  She was also very upset that someone had shaved her husband's beard.  He has had the beard for 42 years and it was part of his identity.    I expressed my apologies that his beard had been shaved.  It was very evident that Maurice Patterson was upset.  I attempted to calm her down.  She wanted to know why I requested the meeting.  I explained that her husband was re-intubated and when that happens it is a good time to re-group with the family and talk about what is going on.  I expressed that we are concerned about Maurice Patterson. Maurice Patterson complained that the doctors are not talking to her.  No one is answering her questions.  No one will tell her what is going on.  I asked again for her to come to the hospital with her family (for support) and we could talk about what is going on with Maurice Patterson.  Maurice Patterson stated "I do not want him to go to  hospice".  I replied that I don't think he is going to hospice.  Maurice Patterson stated "I do not want him to go to a skilled nursing facility".  I began to reply - "I don't think - and Maurice Patterson began speaking again so I did not finish my sentence.  She became very upset on the phone and told  me no-one had ever said anything like that to her on the phone.  I attempted to calm her down and asked her to let me please finish my sentence.  The conversation seemed to repeat itself several times - Maurice Patterson complaining that no one is giving her information but then refusing to meet with me.  I explained that I didn't want to talk about Maurice Patterson over the phone because it was upsetting her too much.  I apologized for upsetting her.  Maurice Patterson talked about not trusting anyone at the hospital.  She seemed to become increasingly frantic as we talked so I tried to end the conversation nicely.  Maurice Patterson stated "Do not hang up this phone or I will sue you and all of Maurice Patterson".   I asked who she did trust - attempting to find someone who could mediate our conversation.  She mentioned knowing Annia Patterson of respiratory, but then stated that she trusted no-one. Maurice Patterson encouraged me to talk to Maurice Patterson.  The conversation continued in circles again.  Maurice Patterson refused to meet with me and insisted that I tell her what I needed to tell her over the phone because she has to work and she is not coming up here.  Then when I started to speak she would become to upset to listen.  Finally I apologized again and explained that I was hanging up the phone because I did not want to upset her further.  I told her that I hoped she had a better afternoon, and I hung up the phone.  During our conversation Maurice Patterson mentioned that she was going to have to plan a funeral on Christmas.  She talked about Maurice Patterson having two grandchildren - she could not let him die on Christmas because of the grandchildren.  She mentioned her father died on August 27, 2023 eight years ago.  Finally she said  that on 12/31 she and Maurice Patterson will have been married 4 years.   Assessment: Patient sedated and requiring ventilator support after suspected aspiration episode.  I'm concerned that he will continue to aspirate on his own secretions if he is successful in weaning from the vent.    His wife was clearly traumatized by her father dying during the Christmas season 8 yrs ago.  She seems to understand her husband is very ill and possibly dying but desperately wants to delay or avoid it all together.   I hope this is possible for her.  I am very concerned for her and feel she needs as much support and understanding as we are able to provide.  I feel that she was reacting to the name "Palliative Medicine" and her assumptions about what the phrase Palliative means more than what I actually said to her.   Patient Profile/HPI:  62 y.o. male admitted on 07/22/2018 from home with complaints of confusion, poor appetite, and generalized weakness. He has a past medical history of MI, arthritis, CAD, CHF, CKD, dementia, diabetes, GERD, HSV encephalitis, hyperlipidemia, hypertension, seizures, and CVA. Family provided history due to patient's altered mental status. During his ED course 2.8, glucose 164, BUN 11, creatinine 0.6, alk phos 141, albumin 3.4, BNP 40, troponin 0 0.03, CBC 10.5, hemoglobin 12.7, head CT showed no acute abnormalities, mild atrophy and minor chronic microvascular ischemic changes.  Chest x-ray showed septal infiltrate in the right upper lung and left base.  It was started on IV antibiotics and continued on home medications.  Admission patient continues to have altered mental status with generalized weakness  and poor p.o. intake.  He continues on Rocephin and azithromycin.  Patient is is S/P lumbar puncture due to prior history of HSV encephalitis.  Palliative medicine team we re-consulted for goals of care discussion    Length of Stay: 16  Current Medications: Scheduled Meds:  . aspirin  81 mg  Per Tube Daily  . budesonide (PULMICORT) nebulizer solution  0.5 mg Nebulization BID  . chlorhexidine gluconate (MEDLINE KIT)  15 mL Mouth Rinse BID  . donepezil  10 mg Oral BID  . enoxaparin (LOVENOX) injection  40 mg Subcutaneous Q24H  . free water  30 mL Per Tube Q4H  . insulin aspart  0-15 Units Subcutaneous Q4H  . insulin detemir  10 Units Subcutaneous BID  . ipratropium-albuterol  3 mL Nebulization Q4H  . lamoTRIgine  100 mg Per Tube BID  . lidocaine  1 patch Transdermal Q24H  . mouth rinse  15 mL Mouth Rinse 10 times per day  . memantine  10 mg Oral BID  . metoprolol tartrate  25 mg Per Tube BID  . modafinil  200 mg Per NG tube Daily  . pantoprazole sodium  40 mg Per Tube Daily  . sodium chloride flush  10-40 mL Intracatheter Q12H  . [START ON 08/09/2018] thiamine  100 mg Per Tube Daily    Continuous Infusions: . sodium chloride 10 mL/hr at 08/08/18 1452  . ampicillin-sulbactam (UNASYN) IV 3 g (08/08/18 1710)  . feeding supplement (VITAL AF 1.2 CAL) 1,000 mL (08/08/18 1637)  . fentaNYL infusion INTRAVENOUS 50 mcg/hr (08/08/18 1452)  . lacosamide (VIMPAT) IV 200 mg (08/08/18 1018)  . norepinephrine (LEVOPHED) Adult infusion Stopped (08/08/18 1332)    PRN Meds: sodium chloride, acetaminophen **OR** acetaminophen, albuterol, fentaNYL, guaiFENesin-dextromethorphan, labetalol, ondansetron **OR** ondansetron (ZOFRAN) IV, phenol, sodium chloride flush  Physical Exam        Well developed male,  Intubated, sedated. CV rrr no m/r/g resp intubated, no signs of distress, no w/c/r Abdomen thin, soft, nt, nd L ext without edema  Vital Signs: BP 116/79   Pulse (!) 109   Temp 98.2 F (36.8 C)   Resp 19   Ht '5\' 6"'$  (1.676 m)   Wt 67.5 kg   SpO2 95%   BMI 24.02 kg/m  SpO2: SpO2: 95 % O2 Device: O2 Device: Ventilator O2 Flow Rate: O2 Flow Rate (L/min): 3 L/min  Intake/output summary:   Intake/Output Summary (Last 24 hours) at 08/08/2018 1733 Last data filed at 08/08/2018  1637 Gross per 24 hour  Intake 552.81 ml  Output 1725 ml  Net -1172.19 ml   LBM: Last BM Date: 08/03/18 Baseline Weight: Weight: 77.6 kg Most recent weight: Weight: 67.5 kg       Palliative Assessment/Data: 10%    Flowsheet Rows     Most Recent Value  Intake Tab  Referral Department  Hospitalist  Unit at Time of Referral  Med/Surg Unit  Date Notified  07/25/18  Palliative Care Type  New Palliative care  Reason for referral  Clarify Goals of Care  Date of Admission  07/22/18  # of days IP prior to Palliative referral  3  Clinical Assessment  Psychosocial & Spiritual Assessment  Palliative Care Outcomes      Patient Active Problem List   Diagnosis Date Noted  . Dysphagia   . Acute respiratory failure ([place])   . Fever   . CAP (community acquired pneumonia) 07/22/2018  . GERD (gastroesophageal reflux disease) 07/22/2018  . Hypersalivation 01/30/2018  . Drooling  01/30/2018  . Alzheimer's dementia ([place]) 05/01/2017  . Cognitive decline 10/20/2016  . Chest pain, rule out acute myocardial infarction 04/05/2016  . Confusion 10/18/2015  . Constipation 08/23/2015  . Uncontrolled type 2 diabetes mellitus with hyperglycemia, with long-term current use of insulin ([place]) 08/23/2015  . Essential hypertension 08/23/2015  . Right lower lobe pneumonia ([place]) 08/23/2015  . Abdominal pain 08/21/2015  . Altered mental status 02/19/2015    Palliative Care Plan    Recommendations/Plan:  PMT will follow up with CCM in the late morning on 12/18 to develop a plan to communicate with the family.    Family needs as much communication and support as we can offer.   Goals of Care and Additional Recommendations:  Limitations on Scope of Treatment: Full Scope Treatment  Code Status:  Full code  Prognosis:   Unable to determine.  Recurrent aspiration and notes that the patient is at high risk for cardiac issues make me fear the the prognosis is poor.  Discharge Planning:  To Be  Determined    Care plan was discussed with bedside RN, Annia Patterson, Dr. Mortimer Fries  Thank you for allowing the Palliative Medicine Team to assist in the care of this patient.  Total time spent:  75 min. Time in 2:00 Time out 3:15     Greater than 50%  of this time was spent counseling and coordinating care related to the above assessment and plan.  Florentina Jenny, PA-C Palliative Medicine  Please contact Palliative MedicineTeam phone at [phone] for questions and concerns between 7 am - 7 pm.   Please see AMION for individual provider pager numbers.

## 2018-08-08 NOTE — Progress Notes (Signed)
Assumed patient care at 1900, family at bedside at beginning of shift. Patient continues to be on ventilator, VSS stable and in no distress. Will continue to monitor and assess.

## 2018-08-08 NOTE — Progress Notes (Signed)
Deidre Ala brother in law called upset regarding palliative care contacting patients wife. States "pallative care said patient is dying and we need to figure out where to place him". 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 I explained that palliative cares role is to discuss plan of care and to answer any questions the family may have. Deidre Ala asked how the patient is doing. I stated that patient is on a ventilator, which is life support and we have been weaning levophed off as his blood pressure has been within parameters. He asked me what would happen if we took him off ventilator and I told him that I could not answer those types of questions. He asked if ICU MD to call wife. I spoke with Dr Alva Garnet and he is aware that patients wife would like a call from him. Continue to monitor.

## 2018-08-08 NOTE — Progress Notes (Signed)
SLP Cancellation Note  Patient Details Name: Maurice Patterson MRN: [id number] DOB: 1956-04-28   Cancelled treatment:       Reason Eval/Treat Not Completed: Patient not medically ready;Medical issues which prohibited therapy(transferred back to CCU and placed on vent support). ST services will sign off at this time w/ MD to reconsult when appropriate.     Orinda Kenner, MS, CCC-SLP Catheline Hixon 08/08/2018, 11:28 AM

## 2018-08-08 NOTE — Procedures (Signed)
Central Venous Catheter Insertion Procedure Note ALWALEED OBESO [id number] Aug 07, 1956  Procedure: Insertion of Central Venous Catheter Indications: Assessment of intravascular volume, Drug and/or fluid administration and Frequent blood sampling  Procedure Details Consent: Risks of procedure as well as the alternatives and risks of each were explained to the (patient/caregiver).  Consent for procedure obtained. Time Out: Verified patient identification, verified procedure, site/side was marked, verified correct patient position, special equipment/implants available, medications/allergies/relevent history reviewed, required imaging and test results available.  Performed  Maximum sterile technique was used including antiseptics, cap, gloves, gown, hand hygiene, mask and sheet. Skin prep: Chlorhexidine; local anesthetic administered A antimicrobial bonded/coated triple lumen catheter was placed in the right femoral vein due to multiple attempts, no other available access using the Seldinger technique.  Evaluation Blood flow good Complications: No apparent complications Patient did tolerate procedure well. Chest X-ray ordered to verify placement.  CXR: pending.  Attempted to place left internal jugular CVL utilizing ultrasound, however unable to thread guidewire.  Left femoral CVL placed utilizing ultrasound no complications noted during or following procedure.  Marda Stalker, [place] Pager [phone] (please enter 7 digits) PCCM Consult Pager [phone] (please enter 7 digits)

## 2018-08-08 NOTE — Progress Notes (Signed)
Pharmacy Antibiotic Note  Maurice Patterson is a 62 y.o. male admitted on 07/22/2018 with aspiration pneumonia.  Pharmacy has been consulted for Unasyn dosing.  Plan: Will start Unasyn 3g IV q8h  Height: 5\' 6"  (167.6 cm) Weight: 148 lb 13 oz (67.5 kg) IBW/kg (Calculated) : 63.8  Temp (24hrs), Avg:98.7 F (37.1 C), Min:98.7 F (37.1 C), Max:98.8 F (37.1 C)  Recent Labs  Lab 08/01/18 0514 08/02/18 0421 08/03/18 0431 08/04/18 0546 08/05/18 0423 08/06/18 0450 08/07/18 0515  WBC 10.3 7.9  --  9.6  --  7.9 8.3  CREATININE 1.39* 1.11 0.94 1.08 1.10 0.95 0.88    Estimated Creatinine Clearance: 79.5 mL/min (by C-G formula based on SCr of 0.88 mg/dL).    Allergies  Allergen Reactions  . Hydrocodone Anaphylaxis  . Morphine Other (See Comments)    Loss of memory  . Ambien [Zolpidem] Other (See Comments)    delirium    . Brilinta [Ticagrelor] Other (See Comments)    Stroke   . Flexeril [Cyclobenzaprine] Other (See Comments)    delerium   . Flunitrazepam Other (See Comments)    ROHYPNOL (hallucinations)  . Haldol [Haloperidol Lactate] Other (See Comments)    delerium   . Levetiracetam Diarrhea and Other (See Comments)    Unable to walk  . Lorazepam Hives  . Risperdal [Risperidone] Other (See Comments)    Delirium   . Trazodone Other (See Comments)    Delirium Can take in low doses   . Benadryl [Diphenhydramine Hcl (Sleep)] Rash  . Penicillins Rash    Mouth ulcers Has patient had a PCN reaction causing immediate rash, facial/tongue/throat swelling, SOB or lightheadedness with hypotension: Yes Has patient had a PCN reaction causing severe rash involving mucus membranes or skin necrosis: No Has patient had a PCN reaction that required hospitalization No Has patient had a PCN reaction occurring within the last 10 years: Yes If all of the above answers are "NO", then may proceed with Cephalosporin use.    Thank you for allowing pharmacy to be a part of this patient's  care.  Tobie Lords, PharmD, BCPS Clinical Pharmacist 08/08/2018

## 2018-08-08 NOTE — Progress Notes (Signed)
Per Dr Mortimer Fries pull back dob-hoff 4 cm. Now at 85 cm. KUB ordered.

## 2018-08-08 NOTE — Progress Notes (Signed)
Spoke with Dr Leonidas Romberg regarding KUB. Orders to pull back an additional 5 cm. (tube placed at 80 cm now). Orders for NO repeat KUB and use dob-hoff. Tube feeds will be restarted.

## 2018-08-08 NOTE — Progress Notes (Signed)
Reviewed the chart, examined the patient, spoke with Dr. Posey Pronto.  Left a message for Dtr in hopes of scheduling a family meeting on 12/18.  PMT will continue to follow with you.  Florentina Jenny, PA-C Palliative Medicine Pager: [phone]   No charge note.

## 2018-08-08 NOTE — Progress Notes (Signed)
Subjective: Patient is currently intubated and sedated. He was apparently doing well but suddenly had change in mental status. Unfortunately he developed respiratory failure from possible aspiration pneumonia and was transferred to the ICU this morning 08/08/2018 where he was re-intubated. Labs today revealed elevated white count 13.6,  Glucose 283, calcium 8.1, last ammonia levels 16 improved from 38. CT head showed no acute intracranial abnormality. Chest x-ray showedChronic interstitial changes with LEFT lung base atelectasis/scarring. He does not follow commands but able to open eyes to deep sternal rub.  Objective: Current vital signs: BP 106/67   Pulse 93   Temp 99 F (37.2 C)   Resp 16   Ht _0  (1.676 m)   Wt 67.5 kg   SpO2 96%   BMI 24.02 kg/m  Vital signs in last 24 hours: Temp:  [98.3 F (36.8 C)-99 F (37.2 C)] 99 F (37.2 C) (12/17 0800) Pulse Rate:  [83-93] 93 (12/17 0800) Resp:  [12-22] 16 (12/17 0800) BP: (75-155)/(60-99) 106/67 (12/17 0800) SpO2:  [92 %-100 %] 96 % (12/17 0800) FiO2 (%):  [30 %-50 %] 30 % (12/17 0725)  Intake/Output from previous day: 12/16 0701 - 12/17 0700 In: 268.3 [I.V.:168.3; IV Piggyback:100] Out: 1975 [Urine:1975] Intake/Output this shift: Total I/O In: 10 [I.V.:10] Out: -  Nutritional status:  Diet Order            Diet NPO time specified  Diet effective now             Mental Status: Intubated and sedated. Patient does not respond to verbal stimuli.  Opens eyes to deep sternal rub.  Does not follow commands.  No verbalizations noted.  Cranial Nerves: II: patient does not respond confrontation bilaterally, pupils right 3 mm, left 3 mm,and minimally reactive bilaterally III,IV,VI: Doll's response present bilaterally.  V,VII: corneal reflex present bilaterally  VIII: patient does not respond to verbal stimuli IX,X: gag reflex present, XI: trapezius strength unable to test bilaterally XII: tongue strength unable to  test Motor: Extremities flaccid throughout.  No spontaneous movement noted.  No purposeful movements noted. Sensory: Flickers lower extremities to noxious stimuli Deep Tendon Reflexes: 2+ and symmetric throughout Absent ankle jerks Plantars: absent bilaterally Cerebellar: Unable to perform  Data Reviewed  Lab Results: Basic Metabolic Panel: Recent Labs  Lab 08/03/18 0431 08/04/18 0546 08/05/18 0423 08/06/18 0450 08/07/18 0515 08/08/18 0533  NA 142 147* 144 144 144 146*  K 3.5 3.5 3.4* 3.4* 3.3* 3.6  CL 106 110 110 109 108 112*  CO2 _1 GLUCOSE 228* 201* 383* 392* 395* 283*  BUN 22 27* 24* _2 CREATININE 0.94 1.08 1.10 0.95 0.88 1.10  CALCIUM 7.9* 8.3* 7.9* 7.9* 8.0* 8.1*  MG 2.0 2.4 2.2  --   --   --   PHOS 2.4* 2.4* 2.3*  --   --  3.1    Liver Function Tests: Recent Labs  Lab 08/03/18 0431 08/06/18 0450  AST 22 22  ALT 21 22  ALKPHOS 100 105  BILITOT 0.8 0.4  PROT 6.1* 5.7*  ALBUMIN 2.5* 2.5*   No results for input(s): LIPASE, AMYLASE in the last 168 hours. Recent Labs  Lab 08/06/18 0450  AMMONIA 16    CBC: Recent Labs  Lab 08/02/18 0421 08/04/18 0546 08/06/18 0450 08/07/18 0515 08/08/18 0533  WBC 7.9 9.6 7.9 8.3 13.6*  NEUTROABS 5.9 6.7  --   --  9.6*  HGB 10.5* 12.9* 11.9* 11.9* 12.3*  HCT 33.4* 40.6 39.0 38.9* 39.8  MCV 92.5 90.8 94.4 94.4 95.4  PLT 222 327 281 276 322    Cardiac Enzymes: No results for input(s): CKTOTAL, CKMB, CKMBINDEX, TROPONINI in the last 168 hours.  Lipid Panel: Recent Labs  Lab 08/06/18 0450  TRIG 48    CBG: Recent Labs  Lab 08/07/18 2243 08/08/18 0119 08/08/18 0345 08/08/18 0553 08/08/18 0725  GLUCAP 222* 255* 272* 258* 189*    Microbiology: Results for orders placed or performed during the hospital encounter of 07/22/18  Urine culture     Status: None   Collection Time: 07/22/18 11:03 PM  Result Value Ref Range Status   Specimen Description   Final    URINE,  RANDOM Performed at [facility], [address] [place], [place] [address]    Special Requests   Final    NONE Performed at [facility], [address] [place], [place] [address]    Culture   Final    NO GROWTH Performed at [facility], [place] [address] [place], [place] [address]    Report Status 07/24/2018 FINAL  Final  CULTURE, BLOOD (ROUTINE X 2) w Reflex to ID Panel     Status: None   Collection Time: 07/23/18 10:06 AM  Result Value Ref Range Status   Specimen Description BLOOD BLOOD LEFT ARM  Final   Special Requests   Final    BOTTLES DRAWN AEROBIC AND ANAEROBIC Blood Culture adequate volume   Culture   Final    NO GROWTH 5 DAYS Performed at [facility], [address] [place], [place] [address]    Report Status 07/28/2018 FINAL  Final  CULTURE, BLOOD (ROUTINE X 2) w Reflex to ID Panel     Status: None   Collection Time: 07/23/18 10:17 AM  Result Value Ref Range Status   Specimen Description BLOOD BLOOD LEFT WRIST  Final   Special Requests   Final    BOTTLES DRAWN AEROBIC AND ANAEROBIC Blood Culture adequate volume   Culture   Final    NO GROWTH 5 DAYS Performed at [facility], [address] [place], [place] [address]    Report Status 07/28/2018 FINAL  Final  Gram stain     Status: None   Collection Time: 07/26/18  3:11 PM  Result Value Ref Range Status   Specimen Description CSF  Final   Special Requests NONE  Final   Gram Stain   Final    NO ORGANISMS SEEN WBC SEEN Performed at [facility], [address], [place], [place] [address]    Report Status 07/26/2018 FINAL  Final  CSF culture     Status: None   Collection Time: 07/26/18  3:11 PM  Result Value Ref Range Status   Specimen Description   Final    CSF Performed at [facility], [address], [place], [place] [address]    Special Requests   Final    NONE Performed at [facility], [address] [place],  [place] [address]    Gram Stain   Final    NO ORGANISMS SEEN WBC SEEN NO RBC SEEN Performed at [facility], [address], [place], [place] [address]    Culture   Final    NO GROWTH 3 DAYS Performed at [facility], [place] [address] [place], [place] [address]    Report Status 07/30/2018 FINAL  Final  Fungus Culture With Stain  Status: None (Preliminary result)   Collection Time: 07/26/18  3:11 PM  Result Value Ref Range Status   Fungus Stain Final report  Final    Comment: (NOTE) Performed At: [facility] [place], [place] [id number] Rush Farmer MD UX:[id number]    Fungus (Mycology) Culture PENDING  Incomplete   Fungal Source CSF  Final    Comment: Performed at [facility], [address] [place], [place] [address]  Fungus Culture Result     Status: None   Collection Time: 07/26/18  3:11 PM  Result Value Ref Range Status   Result 1 Comment  Final    Comment: (NOTE) KOH/Calcofluor preparation:  no fungus observed. Performed At: [facility] [place], [place] [id number] Rush Farmer MD [id number]   CULTURE, BLOOD (ROUTINE X 2) w Reflex to ID Panel     Status: None   Collection Time: 07/29/18  6:14 AM  Result Value Ref Range Status   Specimen Description BLOOD LEFT FOREARM  Final   Special Requests   Final    BOTTLES DRAWN AEROBIC AND ANAEROBIC Blood Culture adequate volume   Culture   Final    NO GROWTH 5 DAYS Performed at [facility], [address] [place], [place] [address]    Report Status 08/03/2018 FINAL  Final  CULTURE, BLOOD (ROUTINE X 2) w Reflex to ID Panel     Status: None   Collection Time: 07/29/18  6:17 AM  Result Value Ref Range Status   Specimen Description BLOOD LEFT HAND  Final   Special Requests   Final    BOTTLES DRAWN AEROBIC AND ANAEROBIC Blood Culture adequate volume   Culture   Final    NO GROWTH 5 DAYS Performed at [facility], [address], [place], [place] [address]    Report Status 08/03/2018 FINAL  Final  MRSA PCR Screening     Status: None   Collection Time: 07/29/18  4:18 PM  Result Value Ref Range Status   MRSA by PCR NEGATIVE NEGATIVE Final    Comment:        The GeneXpert MRSA Assay (FDA approved for NASAL specimens only), is one component of a comprehensive MRSA colonization surveillance program. It is not intended to diagnose MRSA infection nor to guide or monitor treatment for MRSA infections. Performed at [facility], [address] [place], [place] [address]   Culture, respiratory     Status: None   Collection Time: 08/01/18  3:27 PM  Result Value Ref Range Status   Specimen Description   Final    TRACHEAL ASPIRATE Performed at [facility], [address] [place], [place] [address]    Special Requests   Final    NONE Performed at [facility], [address] [place], [place] [address]    Gram Stain   Final    MODERATE WBC PRESENT,BOTH PMN AND MONONUCLEAR RARE GRAM POSITIVE COCCI Performed at [facility], [place] [address], [place], [place] [address]    Culture FEW CANDIDA TROPICALIS  Final   Report Status 08/04/2018 FINAL  Final  Culture, respiratory (non-expectorated)     Status: None   Collection Time: 08/03/18 11:12 AM  Result Value Ref Range Status   Specimen Description   Final    TRACHEAL ASPIRATE Performed at [facility], [address], [place], [place] [address]    Special Requests   Final    NONE Performed at [facility], [place], [place] [address]    Gram Stain   Final    NO WBC SEEN RARE SQUAMOUS EPITHELIAL CELLS PRESENT RARE GRAM POSITIVE COCCI    Culture   Final    FEW Consistent with normal respiratory flora. Performed at [facility], [place] [address], [place], [place] [address]    Report Status 08/06/2018 FINAL  Final    Coagulation Studies: No results for input(s): LABPROT, INR  in the last 72 hours.  Imaging: Ct Head Wo Contrast  Result Date: 08/08/2018 CLINICAL DATA:  Altered level of consciousness. History of seizures, stroke, diabetes, hypertension, hyperlipidemia, pancreatic cancer. EXAM: CT HEAD WITHOUT CONTRAST TECHNIQUE: Contiguous axial images were obtained from the base of the skull through the vertex without intravenous contrast. COMPARISON:  MRI of the head July 28, 2018 and CT HEAD July 26, 2018. FINDINGS: BRAIN: No intraparenchymal hemorrhage, mass effect nor midline shift. Moderate parenchymal brain volume loss. No hydrocephalus. Patchy supratentorial white matter hypodensities. No acute large vascular territory infarcts. No abnormal extra-axial fluid collections. Basal cisterns are patent. VASCULAR: Moderate calcific atherosclerosis of the carotid siphons. SKULL: No skull fracture. No significant scalp soft tissue swelling. SINUSES/ORBITS: Trace paranasal sinus mucosal thickening. Nasogastric tube via RIGHT nares. Mastoid air cells are well aerated.The included ocular globes and orbital contents are non-suspicious. OTHER: None. IMPRESSION: 1. No acute intracranial process. 2. Moderate parenchymal brain volume loss, advanced for age. 3. Mild chronic small vessel ischemic changes. Electronically Signed   By: Elon Alas M.D.   On: 08/08/2018 02:34   Dg Chest Port 1 View  Result Date: 08/08/2018 CLINICAL DATA:  Intubation. EXAM: PORTABLE CHEST 1 VIEW COMPARISON:  Chest radiographs August 07, 2018 FINDINGS: Endotracheal tube tip projects 2.9 cm above the carina. Feeding tube looped in stomach with distal tip projecting at GE junction. Cardiac silhouette is mildly enlarged, coronary artery stent. Calcified aortic arch. Similar hazy density LEFT lung base. Mild chronic interstitial changes. No pneumothorax. Soft tissue planes and included osseous structures are non suspicious. Old bilateral rib fractures. IMPRESSION: 1. Endotracheal tube tip projects 2.9  cm above the carina. Feeding tube placement projects at [employer] junction. Recommend redirection. 2. Mild cardiomegaly. 3. Chronic interstitial changes with LEFT lung base atelectasis/scarring. 4.  Aortic Atherosclerosis (ICD10-I70.0). Electronically Signed   By: Elon Alas M.D.   On: 08/08/2018 01:39   Dg Chest Port 1 View  Result Date: 08/07/2018 CLINICAL DATA:  Cough and congestion EXAM: PORTABLE CHEST 1 VIEW COMPARISON:  07/31/2018 FINDINGS: Endotracheal tube is been removed. Feeding catheter is noted coiled within the stomach. Previously seen nasogastric catheter has been removed. The lungs are incompletely aerated with minimal left basilar atelectasis. Old rib fractures are noted. No other focal abnormality is seen. IMPRESSION: Tubes and lines as described above. Minimal left basilar atelectasis. Electronically Signed   By: Inez Catalina M.D.   On: 08/07/2018 22:04    Medications:  I have reviewed the patient's current medications. Prior to Admission:  Medications Prior to Admission  Medication Sig Dispense Refill Last Dose  . acetaminophen (TYLENOL) 500 MG tablet Take 500 mg by mouth 3 (three) times daily.   07/22/2018 at Unknown time  . aspirin EC 81 MG tablet Take 81 mg by mouth daily.   07/22/2018 at Unknown time  . botulinum toxin Type A (BOTOX) 100 units SOLR injection Inject 100 Units into the muscle every 3 (three) months.   unknown at unknown  . cholecalciferol (VITAMIN D) 1000 units  tablet Take 1,000 Units by mouth daily.    07/22/2018 at Unknown time  . clopidogrel (PLAVIX) 75 MG tablet Take 75 mg by mouth daily.    07/22/2018 at Unknown time  . colestipol (COLESTID) 1 g tablet Take 1 g by mouth daily.    07/22/2018 at Unknown time  . diphenoxylate-atropine (LOMOTIL) 2.5-0.025 MG tablet Take 1 tablet by mouth 3 (three) times daily as needed for diarrhea or loose stools.   prn at prn  . donepezil (ARICEPT) 10 MG tablet Take 1 tablet (10 mg total) by mouth 2 (two) times daily. 180  tablet 3 07/22/2018 at am  . furosemide (LASIX) 40 MG tablet Take 40 mg by mouth daily.    07/22/2018 at Unknown time  . insulin NPH Human (HUMULIN N,NOVOLIN N) 100 UNIT/ML injection Inject 0.25 mLs (25 Units total) into the skin 2 (two) times daily. (Patient taking differently: Inject 32 Units into the skin 2 (two) times daily. ) 10 mL 11 07/22/2018 at am  . insulin regular (NOVOLIN R,HUMULIN R) 100 units/mL injection Inject 0.3 mLs (30 Units total) into the skin 3 (three) times daily with meals. (Patient taking differently: Inject 34 Units into the skin 3 (three) times daily before meals. 34 units subcutaneous 3 times daily with meals with an additional sliding scale of 8 units added with every 20 glucose reading over 200.) 10 mL 1 07/22/2018 at Unknown time  . isosorbide mononitrate (IMDUR) 30 MG 24 hr tablet Take 30 mg by mouth daily.    07/22/2018 at Unknown time  . lamoTRIgine (LAMICTAL) 100 MG tablet Take 100 mg by mouth 2 (two) times daily.   07/22/2018 at am  . lidocaine (LIDODERM) 5 % Place 1 patch onto the skin daily as needed (pain). Remove & Discard patch within 12 hours or as directed by MD (APPLIED TO PATIENT BACK FOR PAIN)   Past Month at Unknown time  . losartan (COZAAR) 100 MG tablet Take 100 mg by mouth daily.   07/22/2018 at Unknown time  . memantine (NAMENDA) 10 MG tablet Take 1 tablet (10 mg total) by mouth 2 (two) times daily. 180 tablet 3 07/22/2018 at am  . metoprolol succinate (TOPROL-XL) 50 MG 24 hr tablet Take 50 mg by mouth daily. Take with or immediately following a meal.    07/22/2018 at Unknown time  . pantoprazole (PROTONIX) 40 MG tablet Take 40 mg by mouth daily.    07/22/2018 at Unknown time  . potassium chloride (K-DUR) 10 MEQ tablet Take 10 mEq by mouth 2 (two) times daily.  2 07/22/2018 at am  . nitroGLYCERIN (NITROSTAT) 0.4 MG SL tablet Place 1 tablet under the tongue every 5 (five) minutes as needed for chest pain.    Taking   Scheduled: . aspirin  81 mg Per  Tube Daily  . chlorhexidine gluconate (MEDLINE KIT)  15 mL Mouth Rinse BID  . donepezil  10 mg Oral BID  . enoxaparin (LOVENOX) injection  40 mg Subcutaneous Q24H  . feeding supplement (PRO-STAT SUGAR FREE 64)  30 mL Per Tube Daily  . free water  180 mL Per Tube Q4H  . insulin aspart  0-15 Units Subcutaneous Q4H  . insulin detemir  10 Units Subcutaneous BID  . ipratropium-albuterol  3 mL Nebulization TID  . lamoTRIgine  100 mg Per Tube BID  . lidocaine  1 patch Transdermal Q24H  . mouth rinse  15 mL Mouth Rinse 10 times per day  . memantine  10 mg Oral  BID  . metoprolol tartrate  25 mg Per Tube BID  . modafinil  200 mg Per NG tube Daily  . pantoprazole sodium  40 mg Per Tube Daily  . sodium chloride flush  10-40 mL Intracatheter Q12H  . thiamine  100 mg Oral Daily    Assessment: 62 y.o malepresenting with acute encephalopathy of unknown etiology in the setting of FUOand probable pneumonia. Initial concerns for CNS infection and started on empiric treatment for possible HSV encephalitis and or meningitis. However work up thus far includinginitial MRI brain on 07/23/2018 negative for acute intracranial process. Repeat MRI brain 12/7 also negative. X-ray showed suspected subtle infiltrate in the right upper lung and left base. EEG  done on 07/28/2018 showed diffuse slowing otherwise no epileptiform discharges noted. He remains on Lamictal 200 mg BID. CSF studies showed slightly elevated WBC and protein otherwise negative for HSV and meningitis therefore Acyclovir and vanc was discontinued. Labs: Utox neg, Flu neg, HIV neg, RPR neg, TSH neg, UA negative. He had extensive work up done at [facility] for similar presentation including LP done on 11/03/2015 withreassuringcell countandnegativeCSF HSV, VZV, and culture. Several serum paraneoplastic panels were sent and negative. Serum autoimmune encephalopathy panel was sent and negative. 14-3-3 for [name] disease was also performed which was  negative. During the course of his admission he had shown some improvement in mental status however neurology was re-consulted for worsening mental status change. Unfortunately he developed respiratory failure from possible aspiration pneumonia and was transferred to the ICU this morning 08/08/2018 where he was re-intubated and sedated. Labs today revealed elevated white count 13.6,  Glucose 283, calcium 8.1, last ammonia levels 16 improved from 38. CT head showed no acute intracranial abnormality. Chest x-ray showedChronic interstitial changes with LEFT lung base atelectasis/scarring.  Recommendations 1. Will repeat EEG 2. Agree with current medical management of underlying medical condition  This patient was staffed with Dr. Irish Elders, Alease Frame who personally evaluated patient, reviewed documentation and agreed with assessment and plan of care as above.  [name], DNP, FNP-BC Board certified Nurse Practitioner Neurology Department    LOS: 16 days   08/08/2018  9:03 AM

## 2018-08-08 NOTE — Procedures (Signed)
Endotracheal Intubation Procedure Note  Indication for endotracheal intubation: airway compromise. Airway Assessment: Mallampati Class: II (hard and soft palate, upper portion of tonsils anduvula visible). Sedation: etomidate and fentanyl. Paralytic: rocuronium. Lidocaine: no. Atropine: no. Equipment: glidescope utilized . Cricoid Pressure: no. Number of attempts: 1. ETT location confirmed by by auscultation, by CXR and ETCO2 monitor.  Pt emergently intubated due to airway compromise no complications noted during or following procedure.  Marda Stalker, [place] Pager [phone] (please enter 7 digits) PCCM Consult Pager [phone] (please enter 7 digits)

## 2018-08-08 NOTE — Progress Notes (Signed)
[facility] NAME: Maurice Patterson    MR#:  [id number]  DATE OF BIRTH:  April 18, 1956  SUBJECTIVE:  CHIEF COMPLAINT: Events of last night noted now intubated  REVIEW OF SYSTEMS:  Review of system unobtainable as the patient intubated DRUG ALLERGIES:   Allergies  Allergen Reactions  . Hydrocodone Anaphylaxis  . Morphine Other (See Comments)    Loss of memory  . Ambien [Zolpidem] Other (See Comments)    delirium    . Brilinta [Ticagrelor] Other (See Comments)    Stroke   . Flexeril [Cyclobenzaprine] Other (See Comments)    delerium   . Flunitrazepam Other (See Comments)    ROHYPNOL (hallucinations)  . Haldol [Haloperidol Lactate] Other (See Comments)    delerium   . Levetiracetam Diarrhea and Other (See Comments)    Unable to walk  . Lorazepam Hives  . Risperdal [Risperidone] Other (See Comments)    Delirium   . Trazodone Other (See Comments)    Delirium Can take in low doses   . Benadryl [Diphenhydramine Hcl (Sleep)] Rash  . Penicillins Rash    Mouth ulcers Has patient had a PCN reaction causing immediate rash, facial/tongue/throat swelling, SOB or lightheadedness with hypotension: Yes Has patient had a PCN reaction causing severe rash involving mucus membranes or skin necrosis: No Has patient had a PCN reaction that required hospitalization No Has patient had a PCN reaction occurring within the last 10 years: Yes If all of the above answers are "NO", then may proceed with Cephalosporin use.    VITALS:  Blood pressure 100/66, pulse 99, temperature 99.5 F (37.5 C), resp. rate 13, height 5\' 6"  (1.676 m), weight 67.5 kg, SpO2 95 %.  PHYSICAL EXAMINATION:  Physical Exam  GENERAL:  62 y.o.-year-old patient lying in the bed critically ill  eYeES: Pupils equal, round, reactive to light and accommodation. No scleral icterus. HEENT: Head atraumatic, normocephalic. Oropharynx and nasopharynx clear.  NECK:  Supple,  no jugular venous distention. No thyroid enlargement, no tenderness.  LUNGS: Normal breath sounds bilaterally, increased rate, no wheezing, some crepitation.  CARDIOVASCULAR: S1, S2 normal.  Systolic murmur is present, no rubs, or gallops.  ABDOMEN: Soft, nontender, nondistended. Bowel sounds present.  EXTREMITIES: No pedal edema, cyanosis, or clubbing.  NEUROLOGIC: Sedated on the vent  pSYCHIATRIC:   Sedated on the vent  sKIN: No obvious rash, lesion, or ulcer.    LABORATORY PANEL:   CBC Recent Labs  Lab 08/08/18 0533  WBC 13.6*  HGB 12.3*  HCT 39.8  PLT 322   ------------------------------------------------------------------------------------------------------------------  Chemistries  Recent Labs  Lab 08/05/18 0423 08/06/18 0450  08/08/18 0533  NA 144 144   < > 146*  K 3.4* 3.4*   < > 3.6  CL 110 109   < > 112*  CO2 28 29   < > 30  GLUCOSE 383* 392*   < > 283*  BUN 24* 18   < > 22  CREATININE 1.10 0.95   < > 1.10  CALCIUM 7.9* 7.9*   < > 8.1*  MG 2.2  --   --   --   AST  --  22  --   --   ALT  --  22  --   --   ALKPHOS  --  105  --   --   BILITOT  --  0.4  --   --    < > = values in this interval not  displayed.   ------------------------------------------------------------------------------------------------------------------  Cardiac Enzymes No results for input(s): TROPONINI in the last 168 hours. ------------------------------------------------------------------------------------------------------------------  RADIOLOGY:  Dg Abd 1 View  Result Date: 08/08/2018 CLINICAL DATA:  Encounter for nasogastric tube placement EXAM: ABDOMEN - 1 VIEW COMPARISON:  Four days ago FINDINGS: There is a feeding tube which reaches the stomach, which coils through the body and tip reaching the fundus. The visualized bowel gas pattern is normal. IMPRESSION: Intragastric feeding tube tip. Electronically Signed   By: Monte Fantasia M.D.   On: 08/08/2018 11:15   Ct Head Wo  Contrast  Result Date: 08/08/2018 CLINICAL DATA:  Altered level of consciousness. History of seizures, stroke, diabetes, hypertension, hyperlipidemia, pancreatic cancer. EXAM: CT HEAD WITHOUT CONTRAST TECHNIQUE: Contiguous axial images were obtained from the base of the skull through the vertex without intravenous contrast. COMPARISON:  MRI of the head July 28, 2018 and CT HEAD July 26, 2018. FINDINGS: BRAIN: No intraparenchymal hemorrhage, mass effect nor midline shift. Moderate parenchymal brain volume loss. No hydrocephalus. Patchy supratentorial white matter hypodensities. No acute large vascular territory infarcts. No abnormal extra-axial fluid collections. Basal cisterns are patent. VASCULAR: Moderate calcific atherosclerosis of the carotid siphons. SKULL: No skull fracture. No significant scalp soft tissue swelling. SINUSES/ORBITS: Trace paranasal sinus mucosal thickening. Nasogastric tube via RIGHT nares. Mastoid air cells are well aerated.The included ocular globes and orbital contents are non-suspicious. OTHER: None. IMPRESSION: 1. No acute intracranial process. 2. Moderate parenchymal brain volume loss, advanced for age. 3. Mild chronic small vessel ischemic changes. Electronically Signed   By: Elon Alas M.D.   On: 08/08/2018 02:34   Dg Chest Port 1 View  Result Date: 08/08/2018 CLINICAL DATA:  Intubation. EXAM: PORTABLE CHEST 1 VIEW COMPARISON:  Chest radiographs August 07, 2018 FINDINGS: Endotracheal tube tip projects 2.9 cm above the carina. Feeding tube looped in stomach with distal tip projecting at GE junction. Cardiac silhouette is mildly enlarged, coronary artery stent. Calcified aortic arch. Similar hazy density LEFT lung base. Mild chronic interstitial changes. No pneumothorax. Soft tissue planes and included osseous structures are non suspicious. Old bilateral rib fractures. IMPRESSION: 1. Endotracheal tube tip projects 2.9 cm above the carina. Feeding tube placement  projects at [employer] junction. Recommend redirection. 2. Mild cardiomegaly. 3. Chronic interstitial changes with LEFT lung base atelectasis/scarring. 4.  Aortic Atherosclerosis (ICD10-I70.0). Electronically Signed   By: Elon Alas M.D.   On: 08/08/2018 01:39   Dg Chest Port 1 View  Result Date: 08/07/2018 CLINICAL DATA:  Cough and congestion EXAM: PORTABLE CHEST 1 VIEW COMPARISON:  07/31/2018 FINDINGS: Endotracheal tube is been removed. Feeding catheter is noted coiled within the stomach. Previously seen nasogastric catheter has been removed. The lungs are incompletely aerated with minimal left basilar atelectasis. Old rib fractures are noted. No other focal abnormality is seen. IMPRESSION: Tubes and lines as described above. Minimal left basilar atelectasis. Electronically Signed   By: Inez Catalina M.D.   On: 08/07/2018 22:04    EKG:   Orders placed or performed during the hospital encounter of 07/22/18  . EKG 12-Lead  . EKG 12-Lead  . EKG 12-Lead  . EKG 12-Lead  . ED EKG  . ED EKG    ASSESSMENT AND PLAN:   62 year old male with past medical history significant for CAD, CHF, CKD, dementia, diabetes, hypertension and seizure disorder presents to hospital secondary to worsening weakness and poor appetite  Acute respiratory failure with hypoxemia Possible aspiration pneumonia on broad-spectrum IV antibiotics Got extubated 08/02/2018 Now reintubated on  08/07/2018   -Acute toxic metabolic encephalopathy with past medical history of HSV encephalitis CT head is negative LP done and no evidence of bacterial meningitis with normal WBC and elevated glucose and protein HSV PCR test is negative and acyclovir discontinued per ID Appreciate ID and neurology recommendations Gram stain is negative.  Cultures on CSF is negative. Respiratory culture collected on 08/03/2018 revealed rare  gram-positive cocci Initial EEG without seizure activity but generalized slowing.  Repeat EEG - no seizure  like activities. Continue Provigil Patient will have MRI and repeat EEG  -Aspiration pneumonia- ac respi failure with hypoxia Continue Unasyn  --  Hypokalemia- replaced.  -Hyponatremia improved   -  Diabetes mellitus on insulin sliding scale,   -  History of seizure disorder/myoclonic jerks-on Lamictal. -According to wife, patient does not have any history of seizures -EEG with generalized slowing, no seizure-like activity.  Empirically Vimpat was added by neurology  -  DVT prophylaxis-on Lovenox   -. Nutrition- Failed attempt for PICC line initially. Patient has NG tube feedings  Gnosis poor palliative care team has reached out to try to communicate with the family  All the records are reviewed  MCODE STATUS: Full Code  TOTAL TIME TAKING CARE OF THIS PATIENT: 35 minutes.   POSSIBLE D/C IN 2 DAYS, DEPENDING ON CLINICAL CONDITION.   Dustin Flock M.D on 08/08/2018 at 2:47 PM  Between 7am to 6pm - Pager - [phone]  After 6pm go to [email or web address] - password [email or web address] [facility]  Office  [phone]  CC: Primary care physician; [name], MD

## 2018-08-08 NOTE — Progress Notes (Signed)
Nutrition Follow-up  DOCUMENTATION CODES:   Not applicable  INTERVENTION:   Change to Vital 1.2 @ goal rate of 71m/hr  Free water flushes 386mq 4 hours  Regimen provides 1584kcal/day, 99g/day protein, 12511may free water   NUTRITION DIAGNOSIS:   Inadequate oral intake related to (acute metabolic encephalopathy) as evidenced by meal completion < 25%.  Ongoing - pt re-intubated and ventilated   GOAL:   Provide needs based on ASPEN/SCCM guidelines  -previously met with tube feeds  MONITOR:   Vent status, Labs, Weight trends, TF tolerance, Skin, I & O's  REASON FOR ASSESSMENT:   Ventilator, Consult Enteral/tube feeding initiation and management  ASSESSMENT:   [age]ar old male with PMHx of seizures, dementia, arthritis, HTN, DM, hx TIA, CAD, CHF, HLD, pancreatitis, CKD, hx CVA 08/2017 who is admitted with PNA, acute metabolic encephalopathy with prior history of HSV encephalitis s/p lumbar puncture on 12/4.   Pt re-intubated today r/t encephalopathy and respiratory failure. NGT in place; tube was backed out 5 cm as tip was projected towards cardia. Tube ok to use per MD. Pt has continued on tube feeds on the floor and has tolerated well. Will change to vital 1.2 in setting of pt ventilated. Tube feeds can be started at goal; suspect low refeed risk. Per chart, pt with ~9lb weight loss since admit; RD will continue to monitor.    Medications reviewed and include: aspirin, lovenox, insulin, protonix, thiamine, unasyn, fentanyl, levophed  Labs reviewed: Na 146(H), Ca 8.1(L), P 3.1 wnl Wbc- 13.6(H) cbgs- 255, 272, 258, 189, 105 x 24 hrs  Patient is currently intubated on ventilator support MV: 7.6 L/min Temp (24hrs), Avg:98.9 F (37.2 C), Min:98.3 F (36.8 C), Max:99.5 F (37.5 C)  Propofol: none   MAP- >23m43m UOP- 1975ml14m4 hrs  I & Os- +13.2L  Diet Order:   Diet Order            Diet NPO time specified  Diet effective now             EDUCATION  NEEDS:   No education needs have been identified at this time  Skin:  Skin Assessment: Reviewed RN Assessment  Last BM:  12/16- Type 7  Height:   Ht Readings from Last 1 Encounters:  07/29/18 '5\' 6"'$  (1.676 m)   Weight:   Wt Readings from Last 1 Encounters:  08/06/18 67.5 kg   Ideal Body Weight:  70 kg  BMI:  Body mass index is 24.02 kg/m.  Estimated Nutritional Needs:   Kcal:  1656kcal/day   Protein:  95-108g/day   Fluid:  >1.9L/day   CaseyKoleen DistanceRD, LDN Pager #- [phone]#- [phone] Hours Pager: [phone]

## 2018-08-08 NOTE — Progress Notes (Signed)
Name: Maurice Patterson MRN: [id number] DOB: [birth year]     CONSULTATION DATE: 07/22/2018  REFERRING MD : Jannifer Franklin  CHIEF COMPLAINT:  resp failure     HISTORY OF PRESENT ILLNESS:   62 yo recent admission to ICU for CAp intubated, s/p extubation Transferred to floor Then subsequently developed worsening mental status changes Patient with GCS<8 and signs of aspiration pneumonia  Patient is now intubated,sedated On full vent support  Critically ill High risk for cardiac arrest   PAST MEDICAL HISTORY :   has a past medical history of Acute MI ([place]), Arthritis, Back pain, CAD (coronary artery disease), CHF (congestive heart failure) ([place]), Chronic kidney disease, Degenerative lumbar disc, Dementia ([place]), Diabetes mellitus without complication ([place]), GERD (gastroesophageal reflux disease), Hernia of abdominal cavity, Hyperlipemia, Hypertension, Malignant intraductal papillary mucinous tumor of pancreas ([place]), Memory loss, Pancreatitis, Seizures ([place]), Shingles, Stroke ([place]) (08/2017), and TIA (transient ischemic attack).  has a past surgical history that includes left arm metal plate; Left shoulder surgery; ERCP (N/A, 09/12/2015); ERCP (N/A, 01/02/2016); Cardiac catheterization; Coronary angioplasty; Total elbow replacement (Left); External fixation wrist fracture (Left); Vasectomy; Ventriculoperitoneal shunt; LEFT HEART CATH AND CORONARY ANGIOGRAPHY (Left, 11/04/2016); Stent times 3 (2013); and Cystoscopy with stent placement (Right, 05/13/2017). Prior to Admission medications   Medication Sig Start Date End Date Taking? Authorizing Provider  acetaminophen (TYLENOL) 500 MG tablet Take 500 mg by mouth 3 (three) times daily.   Yes [provider]  aspirin EC 81 MG tablet Take 81 mg by mouth daily.   Yes [provider]  botulinum toxin Type A (BOTOX) 100 units SOLR injection Inject 100 Units into the muscle every 3 (three) months.   Yes [provider]    cholecalciferol (VITAMIN D) 1000 units tablet Take 1,000 Units by mouth daily.    Yes [provider]  clopidogrel (PLAVIX) 75 MG tablet Take 75 mg by mouth daily.    Yes [provider]  colestipol (COLESTID) 1 g tablet Take 1 g by mouth daily.    Yes [provider]  diphenoxylate-atropine (LOMOTIL) 2.5-0.025 MG tablet Take 1 tablet by mouth 3 (three) times daily as needed for diarrhea or loose stools.   Yes [provider]  donepezil (ARICEPT) 10 MG tablet Take 1 tablet (10 mg total) by mouth 2 (two) times daily. 01/09/18  Yes Ward Givens, NP  furosemide (LASIX) 40 MG tablet Take 40 mg by mouth daily.    Yes [provider]  insulin NPH Human (HUMULIN N,NOVOLIN N) 100 UNIT/ML injection Inject 0.25 mLs (25 Units total) into the skin 2 (two) times daily. Patient taking differently: Inject 32 Units into the skin 2 (two) times daily.  04/05/16  Yes Sudini, Alveta Heimlich, MD  insulin regular (NOVOLIN R,HUMULIN R) 100 units/mL injection Inject 0.3 mLs (30 Units total) into the skin 3 (three) times daily with meals. Patient taking differently: Inject 34 Units into the skin 3 (three) times daily before meals. 34 units subcutaneous 3 times daily with meals with an additional sliding scale of 8 units added with every 20 glucose reading over 200. 04/05/16  Yes Sudini, Alveta Heimlich, MD  isosorbide mononitrate (IMDUR) 30 MG 24 hr tablet Take 30 mg by mouth daily.  10/07/16  Yes [provider]  lamoTRIgine (LAMICTAL) 100 MG tablet Take 100 mg by mouth 2 (two) times daily.   Yes [provider]  lidocaine (LIDODERM) 5 % Place 1 patch onto the skin daily as needed (pain). Remove & Discard patch within 12  hours or as directed by MD (APPLIED TO PATIENT BACK FOR PAIN)   Yes [provider]  losartan (COZAAR) 100 MG tablet Take 100 mg by mouth daily.   Yes [provider]  memantine (NAMENDA) 10 MG tablet Take 1 tablet (10 mg total) by mouth 2 (two)  times daily. 01/09/18  Yes Ward Givens, NP  metoprolol succinate (TOPROL-XL) 50 MG 24 hr tablet Take 50 mg by mouth daily. Take with or immediately following a meal.    Yes [provider]  pantoprazole (PROTONIX) 40 MG tablet Take 40 mg by mouth daily.  10/04/16  Yes [provider]  potassium chloride (K-DUR) 10 MEQ tablet Take 10 mEq by mouth 2 (two) times daily. 07/14/18  Yes [provider]  nitroGLYCERIN (NITROSTAT) 0.4 MG SL tablet Place 1 tablet under the tongue every 5 (five) minutes as needed for chest pain.     [provider]   Allergies  Allergen Reactions  . Hydrocodone Anaphylaxis  . Morphine Other (See Comments)    Loss of memory  . Ambien [Zolpidem] Other (See Comments)    delirium    . Brilinta [Ticagrelor] Other (See Comments)    Stroke   . Flexeril [Cyclobenzaprine] Other (See Comments)    delerium   . Flunitrazepam Other (See Comments)    ROHYPNOL (hallucinations)  . Haldol [Haloperidol Lactate] Other (See Comments)    delerium   . Levetiracetam Diarrhea and Other (See Comments)    Unable to walk  . Lorazepam Hives  . Risperdal [Risperidone] Other (See Comments)    Delirium   . Trazodone Other (See Comments)    Delirium Can take in low doses   . Benadryl [Diphenhydramine Hcl (Sleep)] Rash  . Penicillins Rash    Mouth ulcers Has patient had a PCN reaction causing immediate rash, facial/tongue/throat swelling, SOB or lightheadedness with hypotension: Yes Has patient had a PCN reaction causing severe rash involving mucus membranes or skin necrosis: No Has patient had a PCN reaction that required hospitalization No Has patient had a PCN reaction occurring within the last 10 years: Yes If all of the above answers are "NO", then may proceed with Cephalosporin use.    FAMILY HISTORY:  family history includes ALS in his mother; Alzheimer's disease in his father; CAD in his mother; Diabetes in his father and mother; Heart  Problems in his brother; Hyperlipidemia in his mother; Hypertension in his mother; Stroke in his mother. SOCIAL HISTORY:  reports that he quit smoking about 6 years ago. He has never used smokeless tobacco. He reports that he does not drink alcohol or use drugs.  REVIEW OF SYSTEMS:   Unable to obtain due to critical illness   VITAL SIGNS: Temp:  [98.3 F (36.8 C)-98.8 F (37.1 C)] 98.3 F (36.8 C) (12/17 0500) Pulse Rate:  [83-90] 87 (12/17 0500) Resp:  [12-22] 13 (12/17 0500) BP: (75-155)/(60-99) 96/66 (12/17 0500) SpO2:  [92 %-100 %] 95 % (12/17 0500) FiO2 (%):  [30 %-50 %] 30 % (12/17 0725)  Physical Examination:  GENERAL:critically ill appearing, +resp distress HEAD: Normocephalic, atraumatic.  EYES: Pupils equal, round, reactive to light.  No scleral icterus.  MOUTH: Moist mucosal membrane. NECK: Supple. No JVD.  PULMONARY: +rhonchi, +wheezing CARDIOVASCULAR: S1 and S2. Regular rate and rhythm. No murmurs, rubs, or gallops.  GASTROINTESTINAL: Soft, nontender, -distended. No masses. Positive bowel sounds. No hepatosplenomegaly.  MUSCULOSKELETAL: No swelling, clubbing, or edema.  NEUROLOGIC: obtunded SKIN:intact,warm,dry     ASSESSMENT / PLAN:  Severe Hypoxic and Hypercapnic Respiratory Failure from aspiration pneumonia -continue Full MV support -continue Bronchodilator Therapy -Wean Fio2 and PEEP as tolerated -will perform SAT/SBTTwhen respiratory parameters are met   ID-aspiration pneumonia continue IV abx   NEUROLOGY - intubated and sedated - minimal sedation to achieve a RASS goal: -1 Obtain MRI/EEG    ELECTROLYTES -follow labs as needed -replace as needed -pharmacy consultation and following    DVT/GI PRX ordered TRANSFUSIONS AS NEEDED MONITOR FSBS ASSESS the need for LABS as needed    GI/Nutrition GI PROPHYLAXIS as indicated DIET-->start TF's as tolerated Constipation protocol as indicated    Critical Care Time devoted to patient  care services described in this note is 37 minutes.   Overall, patient is critically ill, prognosis is guarded.  Patient with Multiorgan failure and at high risk for cardiac arrest and death.    Corrin Parker, M.D.  [facility]  Medical Director [place] Director [facility] Cardio-Pulmonary Department

## 2018-08-09 ENCOUNTER — Inpatient Hospital Stay: Payer: PPO

## 2018-08-09 DIAGNOSIS — J690 Pneumonitis due to inhalation of food and vomit: Secondary | ICD-10-CM

## 2018-08-09 DIAGNOSIS — Z515 Encounter for palliative care: Secondary | ICD-10-CM

## 2018-08-09 LAB — CBC
HEMATOCRIT: 38.9 % — AB (ref 39.0–52.0)
Hemoglobin: 11.8 g/dL — ABNORMAL LOW (ref 13.0–17.0)
MCH: 28.9 pg (ref 26.0–34.0)
MCHC: 30.3 g/dL (ref 30.0–36.0)
MCV: 95.3 fL (ref 80.0–100.0)
Platelets: 279 10*3/uL (ref 150–400)
RBC: 4.08 MIL/uL — ABNORMAL LOW (ref 4.22–5.81)
RDW: 16 % — ABNORMAL HIGH (ref 11.5–15.5)
WBC: 17.8 10*3/uL — ABNORMAL HIGH (ref 4.0–10.5)
nRBC: 0 % (ref 0.0–0.2)

## 2018-08-09 LAB — GLUCOSE, CAPILLARY
GLUCOSE-CAPILLARY: 281 mg/dL — AB (ref 70–99)
Glucose-Capillary: 243 mg/dL — ABNORMAL HIGH (ref 70–99)
Glucose-Capillary: 252 mg/dL — ABNORMAL HIGH (ref 70–99)
Glucose-Capillary: 266 mg/dL — ABNORMAL HIGH (ref 70–99)
Glucose-Capillary: 293 mg/dL — ABNORMAL HIGH (ref 70–99)
Glucose-Capillary: 327 mg/dL — ABNORMAL HIGH (ref 70–99)
Glucose-Capillary: 342 mg/dL — ABNORMAL HIGH (ref 70–99)

## 2018-08-09 LAB — BASIC METABOLIC PANEL
Anion gap: 5 (ref 5–15)
BUN: 25 mg/dL — ABNORMAL HIGH (ref 8–23)
CHLORIDE: 112 mmol/L — AB (ref 98–111)
CO2: 30 mmol/L (ref 22–32)
Calcium: 8 mg/dL — ABNORMAL LOW (ref 8.9–10.3)
Creatinine, Ser: 1.24 mg/dL (ref 0.61–1.24)
GFR calc Af Amer: >60 mL/min (ref >60)
GFR calc non Af Amer: >60 mL/min (ref >60)
GLUCOSE: 341 mg/dL — AB (ref 70–99)
Potassium: 3.7 mmol/L (ref 3.5–5.1)
Sodium: 147 mmol/L — ABNORMAL HIGH (ref 135–145)

## 2018-08-09 LAB — URINE CULTURE: Culture: NO GROWTH

## 2018-08-09 MED ORDER — METRONIDAZOLE IN NACL 5-0.79 MG/ML-% IV SOLN
500.0000 mg | Freq: Three times a day (TID) | INTRAVENOUS | Status: DC
  Administered 2018-08-09 – 2018-08-11 (×7): 500 mg via INTRAVENOUS
  Filled 2018-08-09 (×10): qty 100

## 2018-08-09 MED ORDER — SODIUM CHLORIDE 0.9 % IV SOLN
1.0000 g | Freq: Two times a day (BID) | INTRAVENOUS | Status: DC
  Administered 2018-08-09 – 2018-08-15 (×13): 1 g via INTRAVENOUS
  Filled 2018-08-09 (×15): qty 1

## 2018-08-09 MED ORDER — INSULIN DETEMIR 100 UNIT/ML ~~LOC~~ SOLN
12.0000 [IU] | Freq: Two times a day (BID) | SUBCUTANEOUS | Status: DC
  Administered 2018-08-09 – 2018-08-10 (×2): 12 [IU] via SUBCUTANEOUS
  Filled 2018-08-09 (×3): qty 0.12

## 2018-08-09 MED ORDER — LACOSAMIDE 50 MG PO TABS
200.0000 mg | ORAL_TABLET | Freq: Two times a day (BID) | ORAL | Status: DC
  Administered 2018-08-09 – 2018-08-15 (×12): 200 mg
  Filled 2018-08-09 (×12): qty 4

## 2018-08-09 MED ORDER — MODAFINIL 100 MG PO TABS: 200.0000 mg | ORAL_TABLET | Freq: Every day | ORAL | Status: DC

## 2018-08-09 MED ORDER — INSULIN ASPART 100 UNIT/ML ~~LOC~~ SOLN
3.0000 [IU] | SUBCUTANEOUS | Status: DC
  Administered 2018-08-09 – 2018-08-10 (×7): 3 [IU] via SUBCUTANEOUS
  Filled 2018-08-09 (×7): qty 1

## 2018-08-09 NOTE — Progress Notes (Signed)
Subjective: Patient  Remains intubated. He is off sedation and appears to be responding more stimuli today compared to previous. Opens eyes to sternal rub and withdraws extremities to noxious stimuli.  Objective: Current vital signs: BP 107/69 (BP Location: Left Arm)   Pulse 93   Temp 98.4 F (36.9 C) (Axillary)   Resp 13   Ht _0  (1.676 m)   Wt 67.5 kg   SpO2 98%   BMI 24.02 kg/m  Vital signs in last 24 hours: Temp:  [98.2 F (36.8 C)-100.8 F (38.2 C)] 98.4 F (36.9 C) (12/18 0800) Pulse Rate:  [90-113] 93 (12/18 0800) Resp:  [13-35] 13 (12/18 0800) BP: (87-132)/(62-86) 107/69 (12/18 0800) SpO2:  [95 %-99 %] 98 % (12/18 0800) FiO2 (%):  [30 %] 30 % (12/18 0800)  Intake/Output from previous day: 12/17 0701 - 12/18 0700 In: 5922.9 [I.V.:359.2; NG/GT:5263.6; IV Piggyback:300.1] Out: 1075 [Urine:1075] Intake/Output this shift: Total I/O In: -  Out: 175 [Urine:175] Nutritional status:  Diet Order            Diet NPO time specified  Diet effective now              Neurologic Exam: Mental Status: Intubated. Patient does not respond to verbal stimuli.  Opens eyes to deep sternal rub.  Does not follow commands.  No verbalizations noted.  Cranial Nerves: II: patient respond to visual confrontation bilaterally, pupils right 3 mm, left 3 mm,andreactive bil aterally III,IV,VI: Doll's response present bilaterally.  V,VII: corneal reflex present bilaterally  VIII: patient does not respond to verbal stimuli IX,X: gag reflex present, XI: trapezius strength unable to test bilaterally XII: tongue strength unable to test Motor: Withdraws bilateral upper and lower extremities to noxious stimuli. No spontaneous movement noted.  No purposeful movements noted. Sensory: Moves extremities to noxious stimuli Deep Tendon Reflexes: 2+ and symmetric throughout Absent ankle jerks Plantars: absent bilaterally Cerebellar: Unable to perform  Data Reviewed  Lab Results: Basic  Metabolic Panel: Recent Labs  Lab 08/03/18 0431 08/04/18 0546 08/05/18 0423 08/06/18 0450 08/07/18 0515 08/08/18 0533 08/09/18 0459  NA 142 147* 144 144 144 146* 147*  K 3.5 3.5 3.4* 3.4* 3.3* 3.6 3.7  CL 106 110 110 109 108 112* 112*  CO2 _1 GLUCOSE 228* 201* 383* 392* 395* 283* 341*  BUN 22 27* 24* _2 25*  CREATININE 0.94 1.08 1.10 0.95 0.88 1.10 1.24  CALCIUM 7.9* 8.3* 7.9* 7.9* 8.0* 8.1* 8.0*  MG 2.0 2.4 2.2  --   --   --   --   PHOS 2.4* 2.4* 2.3*  --   --  3.1  --     Liver Function Tests: Recent Labs  Lab 08/03/18 0431 08/06/18 0450  AST 22 22  ALT 21 22  ALKPHOS 100 105  BILITOT 0.8 0.4  PROT 6.1* 5.7*  ALBUMIN 2.5* 2.5*   No results for input(s): LIPASE, AMYLASE in the last 168 hours. Recent Labs  Lab 08/06/18 0450  AMMONIA 16    CBC: Recent Labs  Lab 08/04/18 0546 08/06/18 0450 08/07/18 0515 08/08/18 0533 08/09/18 0459  WBC 9.6 7.9 8.3 13.6* 17.8*  NEUTROABS 6.7  --   --  9.6*  --   HGB 12.9* 11.9* 11.9* 12.3* 11.8*  HCT 40.6 39.0 38.9* 39.8 38.9*  MCV 90.8 94.4 94.4 95.4 95.3  PLT 327 281 276 322 279    Cardiac Enzymes: No results for input(s): CKTOTAL, CKMB, CKMBINDEX,  TROPONINI in the last 168 hours.  Lipid Panel: Recent Labs  Lab 08/06/18 0450  TRIG 48    CBG: Recent Labs  Lab 08/08/18 1612 08/08/18 1951 08/09/18 0013 08/09/18 0323 08/09/18 0729  GLUCAP 164* 219* 342* 327* 293*    Microbiology: Results for orders placed or performed during the hospital encounter of 07/22/18  Urine culture     Status: None   Collection Time: 07/22/18 11:03 PM  Result Value Ref Range Status   Specimen Description   Final    URINE, RANDOM Performed at [facility], [address] [place], [place] [address]    Special Requests   Final    NONE Performed at [facility], [address] [place], [place] [address]    Culture   Final    NO GROWTH Performed at [facility], [place] [address], [place], [place] [address]    Report Status 07/24/2018 FINAL  Final  CULTURE, BLOOD (ROUTINE X 2) w Reflex to ID Panel     Status: None   Collection Time: 07/23/18 10:06 AM  Result Value Ref Range Status   Specimen Description BLOOD BLOOD LEFT ARM  Final   Special Requests   Final    BOTTLES DRAWN AEROBIC AND ANAEROBIC Blood Culture adequate volume   Culture   Final    NO GROWTH 5 DAYS Performed at [facility], [address] [place], [place] [address]    Report Status 07/28/2018 FINAL  Final  CULTURE, BLOOD (ROUTINE X 2) w Reflex to ID Panel     Status: None   Collection Time: 07/23/18 10:17 AM  Result Value Ref Range Status   Specimen Description BLOOD BLOOD LEFT WRIST  Final   Special Requests   Final    BOTTLES DRAWN AEROBIC AND ANAEROBIC Blood Culture adequate volume   Culture   Final    NO GROWTH 5 DAYS Performed at [facility], [address], [place], [place] [address]    Report Status 07/28/2018 FINAL  Final  Gram stain     Status: None   Collection Time: 07/26/18  3:11 PM  Result Value Ref Range Status   Specimen Description CSF  Final   Special Requests NONE  Final   Gram Stain   Final    NO ORGANISMS SEEN WBC SEEN Performed at [facility], [address], [place], [place] [address]    Report Status 07/26/2018 FINAL  Final  CSF culture     Status: None   Collection Time: 07/26/18  3:11 PM  Result Value Ref Range Status   Specimen Description   Final    CSF Performed at [facility], [address], [place], [place] [address]    Special Requests   Final    NONE Performed at [facility], [address] [place], [place] [address]    Gram Stain   Final    NO ORGANISMS SEEN WBC SEEN NO RBC SEEN Performed at [facility], [address], [place], [place] [address]    Culture   Final    NO GROWTH 3 DAYS Performed at [facility], [place] [address], [place],  [address]     Report Status 07/30/2018 FINAL  Final  Fungus Culture With Stain     Status: None (Preliminary result)   Collection Time: 07/26/18  3:11 PM  Result Value Ref Range Status   Fungus Stain Final report  Final    Comment: (  NOTE) Performed At: [facility] [place], [place] [id number] Rush Farmer MD [id number]    Fungus (Mycology) Culture PENDING  Incomplete   Fungal Source CSF  Final    Comment: Performed at [facility], [address] [place], [place] [address]  Fungus Culture Result     Status: None   Collection Time: 07/26/18  3:11 PM  Result Value Ref Range Status   Result 1 Comment  Final    Comment: (NOTE) KOH/Calcofluor preparation:  no fungus observed. Performed At: [facility] [place], [place] [id number] Rush Farmer MD PY:[id number]   CULTURE, BLOOD (ROUTINE X 2) w Reflex to ID Panel     Status: None   Collection Time: 07/29/18  6:14 AM  Result Value Ref Range Status   Specimen Description BLOOD LEFT FOREARM  Final   Special Requests   Final    BOTTLES DRAWN AEROBIC AND ANAEROBIC Blood Culture adequate volume   Culture   Final    NO GROWTH 5 DAYS Performed at [facility], [address] [place], [place] [address]    Report Status 08/03/2018 FINAL  Final  CULTURE, BLOOD (ROUTINE X 2) w Reflex to ID Panel     Status: None   Collection Time: 07/29/18  6:17 AM  Result Value Ref Range Status   Specimen Description BLOOD LEFT HAND  Final   Special Requests   Final    BOTTLES DRAWN AEROBIC AND ANAEROBIC Blood Culture adequate volume   Culture   Final    NO GROWTH 5 DAYS Performed at [facility], [address], [place], [place] [address]    Report Status 08/03/2018 FINAL  Final  MRSA PCR Screening     Status: None   Collection Time: 07/29/18  4:18 PM  Result Value Ref Range Status   MRSA by PCR NEGATIVE NEGATIVE Final    Comment:        The GeneXpert MRSA Assay (FDA approved for  NASAL specimens only), is one component of a comprehensive MRSA colonization surveillance program. It is not intended to diagnose MRSA infection nor to guide or monitor treatment for MRSA infections. Performed at [facility], [address] [place], [place] [address]   Culture, respiratory     Status: None   Collection Time: 08/01/18  3:27 PM  Result Value Ref Range Status   Specimen Description   Final    TRACHEAL ASPIRATE Performed at [facility], [address] [place], [place] [address]    Special Requests   Final    NONE Performed at [facility], [address] [place], [place] [address]    Gram Stain   Final    MODERATE WBC PRESENT,BOTH PMN AND MONONUCLEAR RARE GRAM POSITIVE COCCI Performed at [facility], [place] [address], [place], [place] [address]    Culture FEW CANDIDA TROPICALIS  Final   Report Status 08/04/2018 FINAL  Final  Culture, respiratory (non-expectorated)     Status: None   Collection Time: 08/03/18 11:12 AM  Result Value Ref Range Status   Specimen Description   Final    TRACHEAL ASPIRATE Performed at [facility], [address], [place], [place] [address]    Special Requests   Final    NONE Performed at [facility], [place], [place] [address]    Gram Stain   Final    NO WBC SEEN RARE SQUAMOUS EPITHELIAL CELLS PRESENT RARE Lonell Grandchild  POSITIVE COCCI    Culture   Final    FEW Consistent with normal respiratory flora. Performed at [facility], [place] [address] [place], [place] [address]    Report Status 08/06/2018 FINAL  Final  Urine Culture     Status: None   Collection Time: 08/08/18  3:49 AM  Result Value Ref Range Status   Specimen Description   Final    URINE, RANDOM Performed at [facility], [address], [place], [place] [address]    Special Requests   Final    NONE Performed at [facility], [address], [place], [place]  [address]    Culture   Final    NO GROWTH Performed at [facility], [place] [address] [place], [place] [address]    Report Status 08/09/2018 FINAL  Final    Coagulation Studies: No results for input(s): LABPROT, INR in the last 72 hours.  Imaging: Dg Abd 1 View  Result Date: 08/08/2018 CLINICAL DATA:  Encounter for nasogastric tube placement EXAM: ABDOMEN - 1 VIEW COMPARISON:  Four days ago FINDINGS: There is a feeding tube which reaches the stomach, which coils through the body and tip reaching the fundus. The visualized bowel gas pattern is normal. IMPRESSION: Intragastric feeding tube tip. Electronically Signed   By: Monte Fantasia M.D.   On: 08/08/2018 11:15   Ct Head Wo Contrast  Result Date: 08/08/2018 CLINICAL DATA:  Altered level of consciousness. History of seizures, stroke, diabetes, hypertension, hyperlipidemia, pancreatic cancer. EXAM: CT HEAD WITHOUT CONTRAST TECHNIQUE: Contiguous axial images were obtained from the base of the skull through the vertex without intravenous contrast. COMPARISON:  MRI of the head July 28, 2018 and CT HEAD July 26, 2018. FINDINGS: BRAIN: No intraparenchymal hemorrhage, mass effect nor midline shift. Moderate parenchymal brain volume loss. No hydrocephalus. Patchy supratentorial white matter hypodensities. No acute large vascular territory infarcts. No abnormal extra-axial fluid collections. Basal cisterns are patent. VASCULAR: Moderate calcific atherosclerosis of the carotid siphons. SKULL: No skull fracture. No significant scalp soft tissue swelling. SINUSES/ORBITS: Trace paranasal sinus mucosal thickening. Nasogastric tube via RIGHT nares. Mastoid air cells are well aerated.The included ocular globes and orbital contents are non-suspicious. OTHER: None. IMPRESSION: 1. No acute intracranial process. 2. Moderate parenchymal brain volume loss, advanced for age. 3. Mild chronic small vessel ischemic changes. Electronically Signed   By: Elon Alas M.D.   On: 08/08/2018 02:34   Dg Chest Port 1 View  Result Date: 08/08/2018 CLINICAL DATA:  Intubation. EXAM: PORTABLE CHEST 1 VIEW COMPARISON:  Chest radiographs August 07, 2018 FINDINGS: Endotracheal tube tip projects 2.9 cm above the carina. Feeding tube looped in stomach with distal tip projecting at GE junction. Cardiac silhouette is mildly enlarged, coronary artery stent. Calcified aortic arch. Similar hazy density LEFT lung base. Mild chronic interstitial changes. No pneumothorax. Soft tissue planes and included osseous structures are non suspicious. Old bilateral rib fractures. IMPRESSION: 1. Endotracheal tube tip projects 2.9 cm above the carina. Feeding tube placement projects at [employer] junction. Recommend redirection. 2. Mild cardiomegaly. 3. Chronic interstitial changes with LEFT lung base atelectasis/scarring. 4.  Aortic Atherosclerosis (ICD10-I70.0). Electronically Signed   By: Elon Alas M.D.   On: 08/08/2018 01:39   Dg Chest Port 1 View  Result Date: 08/07/2018 CLINICAL DATA:  Cough and congestion EXAM: PORTABLE CHEST 1 VIEW COMPARISON:  07/31/2018 FINDINGS: Endotracheal tube is been removed. Feeding catheter is noted coiled within the stomach. Previously seen nasogastric catheter has  been removed. The lungs are incompletely aerated with minimal left basilar atelectasis. Old rib fractures are noted. No other focal abnormality is seen. IMPRESSION: Tubes and lines as described above. Minimal left basilar atelectasis. Electronically Signed   By: Inez Catalina M.D.   On: 08/07/2018 22:04    Medications:  I have reviewed the patient's current medications. Prior to Admission:  Medications Prior to Admission  Medication Sig Dispense Refill Last Dose  . acetaminophen (TYLENOL) 500 MG tablet Take 500 mg by mouth 3 (three) times daily.   07/22/2018 at Unknown time  . aspirin EC 81 MG tablet Take 81 mg by mouth daily.   07/22/2018 at Unknown time  . botulinum toxin Type A (BOTOX)  100 units SOLR injection Inject 100 Units into the muscle every 3 (three) months.   unknown at unknown  . cholecalciferol (VITAMIN D) 1000 units tablet Take 1,000 Units by mouth daily.    07/22/2018 at Unknown time  . clopidogrel (PLAVIX) 75 MG tablet Take 75 mg by mouth daily.    07/22/2018 at Unknown time  . colestipol (COLESTID) 1 g tablet Take 1 g by mouth daily.    07/22/2018 at Unknown time  . diphenoxylate-atropine (LOMOTIL) 2.5-0.025 MG tablet Take 1 tablet by mouth 3 (three) times daily as needed for diarrhea or loose stools.   prn at prn  . donepezil (ARICEPT) 10 MG tablet Take 1 tablet (10 mg total) by mouth 2 (two) times daily. 180 tablet 3 07/22/2018 at am  . furosemide (LASIX) 40 MG tablet Take 40 mg by mouth daily.    07/22/2018 at Unknown time  . insulin NPH Human (HUMULIN N,NOVOLIN N) 100 UNIT/ML injection Inject 0.25 mLs (25 Units total) into the skin 2 (two) times daily. (Patient taking differently: Inject 32 Units into the skin 2 (two) times daily. ) 10 mL 11 07/22/2018 at am  . insulin regular (NOVOLIN R,HUMULIN R) 100 units/mL injection Inject 0.3 mLs (30 Units total) into the skin 3 (three) times daily with meals. (Patient taking differently: Inject 34 Units into the skin 3 (three) times daily before meals. 34 units subcutaneous 3 times daily with meals with an additional sliding scale of 8 units added with every 20 glucose reading over 200.) 10 mL 1 07/22/2018 at Unknown time  . isosorbide mononitrate (IMDUR) 30 MG 24 hr tablet Take 30 mg by mouth daily.    07/22/2018 at Unknown time  . lamoTRIgine (LAMICTAL) 100 MG tablet Take 100 mg by mouth 2 (two) times daily.   07/22/2018 at am  . lidocaine (LIDODERM) 5 % Place 1 patch onto the skin daily as needed (pain). Remove & Discard patch within 12 hours or as directed by MD (APPLIED TO PATIENT BACK FOR PAIN)   Past Month at Unknown time  . losartan (COZAAR) 100 MG tablet Take 100 mg by mouth daily.   07/22/2018 at Unknown time  .  memantine (NAMENDA) 10 MG tablet Take 1 tablet (10 mg total) by mouth 2 (two) times daily. 180 tablet 3 07/22/2018 at am  . metoprolol succinate (TOPROL-XL) 50 MG 24 hr tablet Take 50 mg by mouth daily. Take with or immediately following a meal.    07/22/2018 at Unknown time  . pantoprazole (PROTONIX) 40 MG tablet Take 40 mg by mouth daily.    07/22/2018 at Unknown time  . potassium chloride (K-DUR) 10 MEQ tablet Take 10 mEq by mouth 2 (two) times daily.  2 07/22/2018 at am  . nitroGLYCERIN (NITROSTAT) 0.4 MG  SL tablet Place 1 tablet under the tongue every 5 (five) minutes as needed for chest pain.    Taking   Scheduled: . aspirin  81 mg Per Tube Daily  . budesonide (PULMICORT) nebulizer solution  0.5 mg Nebulization BID  . chlorhexidine gluconate (MEDLINE KIT)  15 mL Mouth Rinse BID  . donepezil  10 mg Oral BID  . enoxaparin (LOVENOX) injection  40 mg Subcutaneous Q24H  . free water  30 mL Per Tube Q4H  . insulin aspart  0-15 Units Subcutaneous Q4H  . insulin detemir  10 Units Subcutaneous BID  . ipratropium-albuterol  3 mL Nebulization Q4H  . lamoTRIgine  100 mg Per Tube BID  . lidocaine  1 patch Transdermal Q24H  . mouth rinse  15 mL Mouth Rinse 10 times per day  . memantine  10 mg Oral BID  . metoprolol tartrate  25 mg Per Tube BID  . modafinil  200 mg Per NG tube Daily  . pantoprazole sodium  40 mg Per Tube Daily  . sodium chloride flush  10-40 mL Intracatheter Q12H  . thiamine  100 mg Per Tube Daily    Assessment: 62 y.o malepresenting with acute encephalopathy of unknown etiology in the setting of FUOand probable pneumonia.Initial concerns for CNS infection and started on empiric treatment for possible HSV encephalitis and or meningitis. However work up thus far includinginitialMRI brainon 07/23/2018 negative for acute intracranial process. Repeat MRI brain 12/7 also negative.X-ray showed suspected subtle infiltrate in the right upper lung and left base. EEG done on  07/28/2018 showed diffuse slowing otherwise no epileptiform discharges noted. He remains on Lamictal 200 mg BID. CSF studies showed slightly elevated WBC and protein otherwise negative for HSV and meningitis therefore Acyclovir and vanc was discontinued. Labs: Utox neg, Flu neg, HIV neg, RPR neg, TSH neg, UA negative. He had extensive work up done at [facility] for similar presentation including LP done on 11/03/2015 withreassuringcellcountandnegativeCSF HSV, VZV, and culture. Several serum paraneoplastic panels were sent and negative. Serum autoimmune encephalopathy panel was sent and negative. 14-3-3 for [name] disease was also performed which was negative. During the course of his admission he had shown some improvement in mental status however neurology was re-consulted for worsening mental status change. Unfortunately he developed respiratory failure from possible aspiration pneumonia and was transferred to the ICU  on12/17/2019 where he was re-intubated and sedated. Chest x-ray showedChronic interstitial changes with LEFT lung base atelectasis/scarring. Neuro exam improved today. He is off sedation and more responsive to noxious stimuli.  Recommendations 1. Pending EEG 2. Agree with current medical management of underlying medical condition  This patient was staffed with Dr. Irish Elders, Alease Frame who personally evaluated patient, reviewed documentation and agreed with assessment and plan of care as above.  [name], DNP, FNP-BC Board certified Nurse Practitioner Neurology Department   LOS: 17 days   08/09/2018  9:32 AM

## 2018-08-09 NOTE — Progress Notes (Signed)
Patient passed the SBT but physician wants to wait until tomorrow to try to extubate.

## 2018-08-09 NOTE — Progress Notes (Signed)
Inpatient Diabetes Program Recommendations  AACE/ADA: New Consensus Statement on Inpatient Glycemic Control (2015)  Target Ranges:  Prepandial:   less than 140 mg/dL      Peak postprandial:   less than 180 mg/dL (1-2 hours)      Critically ill patients:  140 - 180 mg/dL   Lab Results  Component Value Date   GLUCAP 293 (H) 08/09/2018   HGBA1C 8.9 (H) 04/04/2016    Review of Glycemic Control Results for Maurice Patterson, Maurice Patterson (MRN [id number]) as of 08/09/2018 10:00  Ref. Range 08/08/2018 16:12 08/08/2018 19:51 08/09/2018 00:13 08/09/2018 03:23 08/09/2018 07:29  Glucose-Capillary Latest Ref Range: 70 - 99 mg/dL 164 (H) 219 (H) 342 (H) 327 (H) 293 (H)  Diabetes history: DM 2 Outpatient Diabetes medications:  NPH 32 units bid, Novolin R-34 units tid with meals Current orders for Inpatient glycemic control:  Novolog moderate q 4 hours, Levemir 10 units bid Inpatient Diabetes Program Recommendations:    Please consider adding Novolog tube feed coverage 3 units q 4 hours. Also consider increasing Levemir to 12 units bid.   Thanks,  Adah Perl, RN, BC-ADM Inpatient Diabetes Coordinator Pager [phone] (8a-5p)

## 2018-08-09 NOTE — Progress Notes (Signed)
Pharmacy Monitoring Consult:  Pharmacy consulted to assist in monitoring and replacing electrolytes in this 62 y.o. male admitted on 07/22/2018 with CAP.   Patients current sedation regimen includes fentanyl drip.   Labs:  Sodium (mmol/L)  Date Value  08/09/2018 147 (H)  10/20/2016 146 (H)  12/17/2014 138   Potassium (mmol/L)  Date Value  08/09/2018 3.7  12/17/2014 3.7   Magnesium (mg/dL)  Date Value  08/05/2018 2.2  12/17/2014 1.8   Phosphorus (mg/dL)  Date Value  08/08/2018 3.1   Calcium (mg/dL)  Date Value  08/09/2018 8.0 (L)   Calcium, Total (mg/dL)  Date Value  12/17/2014 7.9 (L)   Albumin (g/dL)  Date Value  08/06/2018 2.5 (L)  10/20/2016 4.5  12/17/2014 3.2 (L)    Assessment/Plan: 1. Electrolytes: no replacement warranted at this time. Will obtain follow up electrolytes with am labs.   2. Constipation: Will initiate senna/docusate 2 tabs VT BID.    3. Glucose: Will initiate novolog 3 units Q4hr, SSI, and increase Levemir to 12 units BID.    Pharmacy will continue to monitor and adjust per consult.   Cordaro Mukai L 08/09/2018 4:55 PM

## 2018-08-09 NOTE — Progress Notes (Signed)
eeg completed ° °

## 2018-08-09 NOTE — Procedures (Signed)
History: 62 yo M being evaluated for encephalopathy   Sedation: fentanyl  Technique: This is a 21 channel routine scalp EEG performed at the bedside with bipolar and monopolar montages arranged in accordance to the international 10/20 system of electrode placement. One channel was dedicated to EKG recording.    Background: The background is predominantly theta as well as generalized irregular delta activity.  There are moderately frequent, quasi-periodic discharges with a shifting bifrontal predominance and triphasic morphology. There is no evolution or other findings concerning for an epileptiform nature.  There are occasional brief periods of generalized suppression.   Photic stimulation: Physiologic driving is not performed  EEG Abnormalities:  1) Triphasic Waves 2) generalized irregular slow activity  Clinical Interpretation: This EEG is consistent with a generalized non-specific cerebral dysfunction(encephalopathy).   There was no seizure or seizure predisposition recorded on this study. Please note that lack of epileptiform activity on EEG does not preclude the possibility of epilepsy.   Roland Rack, MD [facility] [phone]  If 7pm- 7am, please page neurology on call as listed in Gem.

## 2018-08-09 NOTE — Progress Notes (Signed)
I left a message for Cecille Rubin this morning offering support.   I left my office number for her to call if she felt we could be of assistance.     I received a call back from [place] who stated that she would have me arrested for trespassing if I entered the patient's room.  She made it very clear that she does not want to speak with Palliative or have [place] involved in her husband's care.   I replied that we would respect her wishes.   Palliative Medicine Team will sign off.  Florentina Jenny, PA-C Palliative Medicine Pager: [phone]

## 2018-08-09 NOTE — Progress Notes (Signed)
Name: Maurice Patterson MRN: [id number] DOB: [birth year]     CONSULTATION DATE: 07/22/2018  REFERRING MD : Jannifer Franklin  CHIEF COMPLAINT:  Follow up resp failure     HISTORY OF PRESENT ILLNESS:   Remains critically ill +aspiration pneumonia On full vent support   Allergies  Allergen Reactions  . Hydrocodone Anaphylaxis  . Morphine Other (See Comments)    Loss of memory  . Ambien [Zolpidem] Other (See Comments)    delirium    . Brilinta [Ticagrelor] Other (See Comments)    Stroke   . Flexeril [Cyclobenzaprine] Other (See Comments)    delerium   . Flunitrazepam Other (See Comments)    ROHYPNOL (hallucinations)  . Haldol [Haloperidol Lactate] Other (See Comments)    delerium   . Levetiracetam Diarrhea and Other (See Comments)    Unable to walk  . Lorazepam Hives  . Risperdal [Risperidone] Other (See Comments)    Delirium   . Trazodone Other (See Comments)    Delirium Can take in low doses   . Benadryl [Diphenhydramine Hcl (Sleep)] Rash  . Penicillins Rash    Mouth ulcers Has patient had a PCN reaction causing immediate rash, facial/tongue/throat swelling, SOB or lightheadedness with hypotension: Yes Has patient had a PCN reaction causing severe rash involving mucus membranes or skin necrosis: No Has patient had a PCN reaction that required hospitalization No Has patient had a PCN reaction occurring within the last 10 years: Yes If all of the above answers are "NO", then may proceed with Cephalosporin use.     REVIEW OF SYSTEMS:   Unable to obtain due to critical illness   VITAL SIGNS: Temp:  [98.2 F (36.8 C)-100.8 F (38.2 C)] 99.3 F (37.4 C) (12/18 0300) Pulse Rate:  [90-113] 93 (12/18 0600) Resp:  [13-35] 13 (12/18 0600) BP: (87-132)/(62-86) 101/67 (12/18 0600) SpO2:  [95 %-99 %] 98 % (12/18 0600) FiO2 (%):  [30 %] 30 % (12/18 0252)  PHYSICAL EXAMINATION:  GENERAL:critically ill appearing, +resp distress HEAD: Normocephalic, atraumatic.  EYES:  Pupils equal, round, reactive to light.  No scleral icterus.  MOUTH: Moist mucosal membrane. NECK: Supple. No thyromegaly. No nodules. No JVD.  PULMONARY: +rhonchi, +wheezing CARDIOVASCULAR: S1 and S2. Regular rate and rhythm. No murmurs, rubs, or gallops.  GASTROINTESTINAL: Soft, nontender, -distended. No masses. Positive bowel sounds. No hepatosplenomegaly.  MUSCULOSKELETAL: No swelling, clubbing, or edema.  NEUROLOGIC: obtunded SKIN:intact,warm,dry       ASSESSMENT / PLAN:   Severe Hypoxic and Hypercapnic Respiratory Failure -continue Full MV support -continue Bronchodilator Therapy -Wean Fio2 and PEEP as tolerated -will perform SAT/SBTTwhen respiratory parameters are met  INFECTIOUS DISEASE -continue antibiotics as prescribed -follow up cultures -aspiration pneumonia   NEUROLOGY - intubated and sedated - minimal sedation to achieve a RASS goal: -1    GI/Nutrition GI PROPHYLAXIS as indicated DIET-->TF's as tolerated Constipation protocol as indicated  ELECTROLYTES -follow labs as needed -replace as needed -pharmacy consultation and following    DVT/GI PRX ordered TRANSFUSIONS AS NEEDED MONITOR FSBS ASSESS the need for LABS as needed   DVT/GI PRX ordered TRANSFUSIONS AS NEEDED MONITOR FSBS ASSESS the need for LABS as needed    Critical Care Time devoted to patient care services described in this note is 34 minutes.   Overall, patient is critically ill, prognosis is guarded.  Patient with Multiorgan failure and at high risk for cardiac arrest and death.    Corrin Parker, M.D.  [facility]  Medical Director [facility]  [facility] Cardio-Pulmonary Department

## 2018-08-09 NOTE — Progress Notes (Signed)
[place] at [place] NAME: Maurice Patterson    MR#:  [id number]  DATE OF BIRTH:  Jul 19, 1956  SUBJECTIVE:   Patient transferred back to the ICU and reintubated due to altered mental status and suspected aspiration.  Patient getting EEG this morning.  REVIEW OF SYSTEMS:    Review of Systems  Unable to perform ROS: Intubated    Nutrition: Tube feeds Tolerating Diet: yes   DRUG ALLERGIES:   Allergies  Allergen Reactions  . Hydrocodone Anaphylaxis  . Morphine Other (See Comments)    Loss of memory  . Ambien [Zolpidem] Other (See Comments)    delirium    . Brilinta [Ticagrelor] Other (See Comments)    Stroke   . Flexeril [Cyclobenzaprine] Other (See Comments)    delerium   . Flunitrazepam Other (See Comments)    ROHYPNOL (hallucinations)  . Haldol [Haloperidol Lactate] Other (See Comments)    delerium   . Levetiracetam Diarrhea and Other (See Comments)    Unable to walk  . Lorazepam Hives  . Risperdal [Risperidone] Other (See Comments)    Delirium   . Trazodone Other (See Comments)    Delirium Can take in low doses   . Benadryl [Diphenhydramine Hcl (Sleep)] Rash  . Penicillins Rash    Mouth ulcers Has patient had a PCN reaction causing immediate rash, facial/tongue/throat swelling, SOB or lightheadedness with hypotension: Yes Has patient had a PCN reaction causing severe rash involving mucus membranes or skin necrosis: No Has patient had a PCN reaction that required hospitalization No Has patient had a PCN reaction occurring within the last 10 years: Yes If all of the above answers are "NO", then may proceed with Cephalosporin use.    VITALS:  Blood pressure 122/68, pulse 96, temperature 98.4 F (36.9 C), temperature source Axillary, resp. rate 15, height 5\' 6"  (1.676 m), weight 67.5 kg, SpO2 98 %.  PHYSICAL EXAMINATION:   Physical Exam  GENERAL:  62 y.o.-year-old patient lying in bed sedated & intubated.     EYES: Pupils equal, round, reactive to light and accommodation. No scleral icterus. Extraocular muscles intact.  HEENT: Head atraumatic, normocephalic. ET and OG tube in place.   NECK:  Supple, no jugular venous distention. No thyroid enlargement, no tenderness.  LUNGS: Normal breath sounds bilaterally, no wheezing, rales, rhonchi. No use of accessory muscles of respiration.  CARDIOVASCULAR: S1, S2 normal. No murmurs, rubs, or gallops.  ABDOMEN: Soft, nontender, nondistended. Bowel sounds present. No organomegaly or mass.  EXTREMITIES: No cyanosis, clubbing or edema b/l.    NEUROLOGIC: Sedated & Intubated    PSYCHIATRIC: Sedated & Intubated.  SKIN: No obvious rash, lesion, or ulcer.    LABORATORY PANEL:   CBC Recent Labs  Lab 08/09/18 0459  WBC 17.8*  HGB 11.8*  HCT 38.9*  PLT 279   ------------------------------------------------------------------------------------------------------------------  Chemistries  Recent Labs  Lab 08/05/18 0423 08/06/18 0450  08/09/18 0459  NA 144 144   < > 147*  K 3.4* 3.4*   < > 3.7  CL 110 109   < > 112*  CO2 28 29   < > 30  GLUCOSE 383* 392*   < > 341*  BUN 24* 18   < > 25*  CREATININE 1.10 0.95   < > 1.24  CALCIUM 7.9* 7.9*   < > 8.0*  MG 2.2  --   --   --   AST  --  22  --   --  ALT  --  22  --   --   ALKPHOS  --  105  --   --   BILITOT  --  0.4  --   --    < > = values in this interval not displayed.   ------------------------------------------------------------------------------------------------------------------  Cardiac Enzymes No results for input(s): TROPONINI in the last 168 hours. ------------------------------------------------------------------------------------------------------------------  RADIOLOGY:  Dg Abd 1 View  Result Date: 08/08/2018 CLINICAL DATA:  Encounter for nasogastric tube placement EXAM: ABDOMEN - 1 VIEW COMPARISON:  Four days ago FINDINGS: There is a feeding tube which reaches the stomach, which  coils through the body and tip reaching the fundus. The visualized bowel gas pattern is normal. IMPRESSION: Intragastric feeding tube tip. Electronically Signed   By: Monte Fantasia M.D.   On: 08/08/2018 11:15   Ct Head Wo Contrast  Result Date: 08/08/2018 CLINICAL DATA:  Altered level of consciousness. History of seizures, stroke, diabetes, hypertension, hyperlipidemia, pancreatic cancer. EXAM: CT HEAD WITHOUT CONTRAST TECHNIQUE: Contiguous axial images were obtained from the base of the skull through the vertex without intravenous contrast. COMPARISON:  MRI of the head July 28, 2018 and CT HEAD July 26, 2018. FINDINGS: BRAIN: No intraparenchymal hemorrhage, mass effect nor midline shift. Moderate parenchymal brain volume loss. No hydrocephalus. Patchy supratentorial white matter hypodensities. No acute large vascular territory infarcts. No abnormal extra-axial fluid collections. Basal cisterns are patent. VASCULAR: Moderate calcific atherosclerosis of the carotid siphons. SKULL: No skull fracture. No significant scalp soft tissue swelling. SINUSES/ORBITS: Trace paranasal sinus mucosal thickening. Nasogastric tube via RIGHT nares. Mastoid air cells are well aerated.The included ocular globes and orbital contents are non-suspicious. OTHER: None. IMPRESSION: 1. No acute intracranial process. 2. Moderate parenchymal brain volume loss, advanced for age. 3. Mild chronic small vessel ischemic changes. Electronically Signed   By: Elon Alas M.D.   On: 08/08/2018 02:34   Dg Chest Port 1 View  Result Date: 08/08/2018 CLINICAL DATA:  Intubation. EXAM: PORTABLE CHEST 1 VIEW COMPARISON:  Chest radiographs August 07, 2018 FINDINGS: Endotracheal tube tip projects 2.9 cm above the carina. Feeding tube looped in stomach with distal tip projecting at GE junction. Cardiac silhouette is mildly enlarged, coronary artery stent. Calcified aortic arch. Similar hazy density LEFT lung base. Mild chronic  interstitial changes. No pneumothorax. Soft tissue planes and included osseous structures are non suspicious. Old bilateral rib fractures. IMPRESSION: 1. Endotracheal tube tip projects 2.9 cm above the carina. Feeding tube placement projects at [employer] junction. Recommend redirection. 2. Mild cardiomegaly. 3. Chronic interstitial changes with LEFT lung base atelectasis/scarring. 4.  Aortic Atherosclerosis (ICD10-I70.0). Electronically Signed   By: Elon Alas M.D.   On: 08/08/2018 01:39   Dg Chest Port 1 View  Result Date: 08/07/2018 CLINICAL DATA:  Cough and congestion EXAM: PORTABLE CHEST 1 VIEW COMPARISON:  07/31/2018 FINDINGS: Endotracheal tube is been removed. Feeding catheter is noted coiled within the stomach. Previously seen nasogastric catheter has been removed. The lungs are incompletely aerated with minimal left basilar atelectasis. Old rib fractures are noted. No other focal abnormality is seen. IMPRESSION: Tubes and lines as described above. Minimal left basilar atelectasis. Electronically Signed   By: Inez Catalina M.D.   On: 08/07/2018 22:04     ASSESSMENT AND PLAN:    62 year old male with past medical history significant for CAD, CHF, CKD, dementia, diabetes, hypertension and seizure disorder presents to hospital secondary to worsening weakness and poor appetite  1.  Acute respiratory failure with hypoxia-patient was extubated on August 02, 2018 and reintubated on 07 August 2018.  Suspected to be secondary to aspiration pneumonia. -Continue vent support and pulmonary toileting and further care as per intensivist. - Continue IV cefepime, Flagyl for the pneumonia.  2.  Aspiration pneumonia-source of patient's worsening respiratory failure with hypoxemia. -Continue IV cefepime, Flagyl, follow cultures.    3.  Altered mental status/encephalopathy-etiology unclear.  Patient CT head was negative yesterday -Patient's LP showed no evidence of bacterial meningitis or herpes  encephalitis. -Seen by infectious disease and neurology.  Etiology still remains unclear.  There was some concern for possible Wynelle Cleveland but that has been ruled out too.  - Patient to have repeat MRI/EEG.  EEG was done today but results are pending. - Patient remains off sedation and still remains encephalopathic and does not follow any commands. -Continue empiric Lamictal, Vimpat as per neurology.  4.  Dementia-continue Namenda, aricept.  5.  Essential hypertension-continue metoprolol.  6.  Diabetes type 2 without complication-continue Levemir, NovoLog sliding scale  7. GERD - cont. Protonix.    All the records are reviewed and case discussed with Care Management/Social Worker. Management plans discussed with the patient, family and they are in agreement.  CODE STATUS: Full code  DVT Prophylaxis: Lovenox  TOTAL TIME TAKING CARE OF THIS PATIENT: 30 minutes.   POSSIBLE D/C unclear DEPENDING ON CLINICAL CONDITION.   Henreitta Leber M.D on 08/09/2018 at 3:58 PM  Between 7am to 6pm - Pager - [phone]  After 6pm go to [email or web address] - Proofreader  [facility]  Office  [phone]  CC: Primary care physician; [name], MD

## 2018-08-10 DIAGNOSIS — F0281 Dementia in other diseases classified elsewhere with behavioral disturbance: Secondary | ICD-10-CM

## 2018-08-10 DIAGNOSIS — G300 Alzheimer's disease with early onset: Secondary | ICD-10-CM

## 2018-08-10 LAB — CBC
HCT: 35 % — ABNORMAL LOW (ref 39.0–52.0)
Hemoglobin: 10.7 g/dL — ABNORMAL LOW (ref 13.0–17.0)
MCH: 29.3 pg (ref 26.0–34.0)
MCHC: 30.6 g/dL (ref 30.0–36.0)
MCV: 95.9 fL (ref 80.0–100.0)
NRBC: 0 % (ref 0.0–0.2)
PLATELETS: 250 10*3/uL (ref 150–400)
RBC: 3.65 MIL/uL — ABNORMAL LOW (ref 4.22–5.81)
RDW: 16.4 % — ABNORMAL HIGH (ref 11.5–15.5)
WBC: 13.6 10*3/uL — ABNORMAL HIGH (ref 4.0–10.5)

## 2018-08-10 LAB — BASIC METABOLIC PANEL
Anion gap: 3 — ABNORMAL LOW (ref 5–15)
BUN: 25 mg/dL — ABNORMAL HIGH (ref 8–23)
CO2: 31 mmol/L (ref 22–32)
Calcium: 7.9 mg/dL — ABNORMAL LOW (ref 8.9–10.3)
Chloride: 113 mmol/L — ABNORMAL HIGH (ref 98–111)
Creatinine, Ser: 1.1 mg/dL (ref 0.61–1.24)
GFR calc non Af Amer: >60 mL/min (ref >60)
Glucose, Bld: 294 mg/dL — ABNORMAL HIGH (ref 70–99)
Potassium: 4.3 mmol/L (ref 3.5–5.1)
Sodium: 147 mmol/L — ABNORMAL HIGH (ref 135–145)

## 2018-08-10 LAB — PHOSPHORUS: Phosphorus: 2.9 mg/dL (ref 2.5–4.6)

## 2018-08-10 LAB — GLUCOSE, CAPILLARY
GLUCOSE-CAPILLARY: 216 mg/dL — AB (ref 70–99)
Glucose-Capillary: 277 mg/dL — ABNORMAL HIGH (ref 70–99)
Glucose-Capillary: 230 mg/dL — ABNORMAL HIGH (ref 70–99)
Glucose-Capillary: 214 mg/dL — ABNORMAL HIGH (ref 70–99)
Glucose-Capillary: 315 mg/dL — ABNORMAL HIGH (ref 70–99)
Glucose-Capillary: 278 mg/dL — ABNORMAL HIGH (ref 70–99)

## 2018-08-10 LAB — MAGNESIUM: Magnesium: 2.3 mg/dL (ref 1.7–2.4)

## 2018-08-10 MED ORDER — FREE WATER
140.0000 mL | Status: DC
  Administered 2018-08-10 – 2018-08-11 (×7): 140 mL

## 2018-08-10 MED ORDER — MODAFINIL 100 MG PO TABS
200.0000 mg | ORAL_TABLET | Freq: Every day | ORAL | Status: DC
  Administered 2018-08-11 – 2018-08-19 (×9): 200 mg
  Filled 2018-08-10 (×9): qty 2

## 2018-08-10 MED ORDER — INSULIN ASPART 100 UNIT/ML ~~LOC~~ SOLN
5.0000 [IU] | SUBCUTANEOUS | Status: DC
  Administered 2018-08-10 – 2018-08-14 (×21): 5 [IU] via SUBCUTANEOUS
  Filled 2018-08-10 (×21): qty 1

## 2018-08-10 MED ORDER — POLYETHYLENE GLYCOL 3350 17 G PO PACK
17.0000 g | PACK | Freq: Every day | ORAL | Status: DC
  Administered 2018-08-10 – 2018-09-08 (×21): 17 g
  Filled 2018-08-10 (×21): qty 1

## 2018-08-10 MED ORDER — INSULIN DETEMIR 100 UNIT/ML ~~LOC~~ SOLN
20.0000 [IU] | Freq: Two times a day (BID) | SUBCUTANEOUS | Status: DC
  Administered 2018-08-10 – 2018-08-13 (×7): 20 [IU] via SUBCUTANEOUS
  Filled 2018-08-10 (×9): qty 0.2

## 2018-08-10 MED ORDER — FENTANYL CITRATE (PF) 100 MCG/2ML IJ SOLN
25.0000 ug | INTRAMUSCULAR | Status: DC | PRN
  Administered 2018-08-11: 25 ug via INTRAVENOUS
  Filled 2018-08-10: qty 2

## 2018-08-10 NOTE — Progress Notes (Signed)
Name: Maurice Patterson MRN: [id number] DOB: 03/09/1956     CONSULTATION DATE: 07/22/2018  REFERRING MD : Jannifer Franklin  CHIEF COMPLAINT:   Follow up resp failure     HISTORY OF PRESENT ILLNESS:   +aspiration pneumonia +resp failure +obtunded Off sedation  Remains critically ill   Allergies  Allergen Reactions  . Hydrocodone Anaphylaxis  . Morphine Other (See Comments)    Loss of memory  . Ambien [Zolpidem] Other (See Comments)    delirium    . Brilinta [Ticagrelor] Other (See Comments)    Stroke   . Flexeril [Cyclobenzaprine] Other (See Comments)    delerium   . Flunitrazepam Other (See Comments)    ROHYPNOL (hallucinations)  . Haldol [Haloperidol Lactate] Other (See Comments)    delerium   . Levetiracetam Diarrhea and Other (See Comments)    Unable to walk  . Lorazepam Hives  . Risperdal [Risperidone] Other (See Comments)    Delirium   . Trazodone Other (See Comments)    Delirium Can take in low doses   . Benadryl [Diphenhydramine Hcl (Sleep)] Rash  . Penicillins Rash    Mouth ulcers Has patient had a PCN reaction causing immediate rash, facial/tongue/throat swelling, SOB or lightheadedness with hypotension: Yes Has patient had a PCN reaction causing severe rash involving mucus membranes or skin necrosis: No Has patient had a PCN reaction that required hospitalization No Has patient had a PCN reaction occurring within the last 10 years: Yes If all of the above answers are "NO", then may proceed with Cephalosporin use.     REVIEW OF SYSTEMS:   Unable to obtain due to critical illness   VITAL SIGNS: Temp:  [98.2 F (36.8 C)-99 F (37.2 C)] 98.2 F (36.8 C) (12/19 0700) Pulse Rate:  [87-109] 103 (12/19 0700) Resp:  [11-21] 13 (12/19 0700) BP: (90-126)/(58-86) 112/71 (12/19 0700) SpO2:  [97 %-100 %] 100 % (12/19 0700) FiO2 (%):  [30 %] 30 % (12/19 0700) Weight:  [82 kg] 67 kg (12/19 0500)  PHYSICAL EXAMINATION:  GENERAL:critically ill appearing,  +resp distress HEAD: Normocephalic, atraumatic.  EYES: Pupils equal, round, reactive to light.  No scleral icterus.  MOUTH: Moist mucosal membrane. NECK: Supple. No thyromegaly. No nodules. No JVD.  PULMONARY: +rhonchi, +wheezing CARDIOVASCULAR: S1 and S2. Regular rate and rhythm. No murmurs, rubs, or gallops.  GASTROINTESTINAL: Soft, nontender, -distended. No masses. Positive bowel sounds. No hepatosplenomegaly.  MUSCULOSKELETAL: No swelling, clubbing, or edema.  NEUROLOGIC: obtunded SKIN:intact,warm,dry        ASSESSMENT / PLAN:   Severe Hypoxic and Hypercapnic Respiratory Failure -continue Mechanical Ventilator support -continue Bronchodilator Therapy -Wean Fio2 and PEEP as tolerated -VAP/VENT bundle implementation -will perform SAT/SBT when respiratory parameters are met  INFECTIOUS DISEASE -continue antibiotics as prescribed -follow up cultures Aspiration pneumonia    NEUROLOGY - intubated and sedated - minimal sedation to achieve a RASS goal: -1 Avoid all sedatives     GI/Nutrition GI PROPHYLAXIS as indicated DIET-->TF's as tolerated Constipation protocol as indicated  ELECTROLYTES -follow labs as needed -replace as needed -pharmacy consultation and following    DVT/GI PRX ordered TRANSFUSIONS AS NEEDED MONITOR FSBS ASSESS the need for LABS as needed     Critical Care Time devoted to patient care services described in this note is 37 minutes.   Overall, patient is critically ill, prognosis is guarded.  Patient with Multiorgan failure and at high risk for cardiac arrest and death.   Wife at bedside updated, plan to avoid all sedatives at this  time  Corrin Parker, M.D.  [facility]  Medical Director [place] Director [facility] Cardio-Pulmonary Department

## 2018-08-10 NOTE — Progress Notes (Signed)
All potentially sedating medications have been discontinued per spouse [name]'s request.  She has reported that the patient normally takes "days" to wake up from sedation so it will take a while for him to wake up enough to control his airway.  He did not respond as well to being off sedation and on SBT today.  He was on SBT approx 2 hours and WOB and RR increased.  He is currently breathing 8/min over the vent and using accessory muscles, although not excessively.  His VS are stable.  Writing RN called Cecille Rubin and she stated that giving small doses of PRN medication to keep Maurice Patterson comfortable is fine.  Per Dr Mortimer Fries order 25 mcg fent and 1 mg Ativan Q1H PRN

## 2018-08-10 NOTE — Progress Notes (Signed)
Pharmacy Monitoring Consult:  Pharmacy consulted to assist in monitoring and replacing electrolytes in this 62 y.o. male admitted on 07/22/2018 with CAP.   Patient is currently off all sedation.   Labs:  Sodium (mmol/Patterson)  Date Value  08/10/2018 147 (H)  10/20/2016 146 (H)  12/17/2014 138   Potassium (mmol/Patterson)  Date Value  08/10/2018 4.3  12/17/2014 3.7   Magnesium (mg/dL)  Date Value  08/10/2018 2.3  12/17/2014 1.8   Phosphorus (mg/dL)  Date Value  08/10/2018 2.9   Calcium (mg/dL)  Date Value  08/10/2018 7.9 (Patterson)   Calcium, Total (mg/dL)  Date Value  12/17/2014 7.9 (Patterson)   Albumin (g/dL)  Date Value  08/06/2018 2.5 (Patterson)  10/20/2016 4.5  12/17/2014 3.2 (Patterson)    Assessment/Plan: 1. Electrolytes: no replacement warranted at this time. Will obtain follow up electrolytes with am labs.   2. Constipation: Continue senna/docusate 2 tabs VT BID. Will order Miralax VT x 1.   3. Glucose: Per diabetes team recommendations will increase to novolog 5 units Q4hr, SSI, and increase Levemir to 20 units BID.    Pharmacy will continue to monitor and adjust per consult.   Maurice Patterson,Maurice Patterson 08/10/2018 2:29 PM

## 2018-08-10 NOTE — Progress Notes (Signed)
Patient continues to be on ventilator, tube feeds and fentanyl  throughout the shift with no change in status. Patient still not following commands. No family present at bedside tonight. Will continue to monitor and assess.

## 2018-08-10 NOTE — Progress Notes (Signed)
[place] at [place] NAME: Maurice Patterson    MR#:  [id number]  DATE OF BIRTH:  29-Mar-1956  SUBJECTIVE:   Patient remains intubated.  No acute events overnight, does not follow any commands.  Off sedation.  REVIEW OF SYSTEMS:    Review of Systems  Unable to perform ROS: Intubated    Nutrition: Tube feeds Tolerating Diet: yes   DRUG ALLERGIES:   Allergies  Allergen Reactions  . Hydrocodone Anaphylaxis  . Morphine Other (See Comments)    Loss of memory  . Ambien [Zolpidem] Other (See Comments)    delirium    . Brilinta [Ticagrelor] Other (See Comments)    Stroke   . Flexeril [Cyclobenzaprine] Other (See Comments)    delerium   . Flunitrazepam Other (See Comments)    ROHYPNOL (hallucinations)  . Haldol [Haloperidol Lactate] Other (See Comments)    delerium   . Levetiracetam Diarrhea and Other (See Comments)    Unable to walk  . Lorazepam Hives  . Risperdal [Risperidone] Other (See Comments)    Delirium   . Trazodone Other (See Comments)    Delirium Can take in low doses   . Benadryl [Diphenhydramine Hcl (Sleep)] Rash  . Penicillins Rash    Mouth ulcers Has patient had a PCN reaction causing immediate rash, facial/tongue/throat swelling, SOB or lightheadedness with hypotension: Yes Has patient had a PCN reaction causing severe rash involving mucus membranes or skin necrosis: No Has patient had a PCN reaction that required hospitalization No Has patient had a PCN reaction occurring within the last 10 years: Yes If all of the above answers are "NO", then may proceed with Cephalosporin use.    VITALS:  Blood pressure 121/67, pulse (!) 103, temperature 98.2 F (36.8 C), temperature source Axillary, resp. rate 20, height 5\' 6"  (1.676 m), weight 67 kg, SpO2 98 %.  PHYSICAL EXAMINATION:   Physical Exam  GENERAL:  62 y.o.-year-old patient lying in bed Encephalopathic and intubated.   EYES: Pupils equal, round,  reactive to light. No scleral icterus.  HEENT: Head atraumatic, normocephalic. ET and OG tube in place.   NECK:  Supple, no jugular venous distention. No thyroid enlargement, no tenderness.  LUNGS: Poor Resp. effort, no wheezing, rales, rhonchi. No use of accessory muscles of respiration.  CARDIOVASCULAR: S1, S2 normal. No murmurs, rubs, or gallops.  ABDOMEN: Soft, nontender, nondistended. Bowel sounds present. No organomegaly or mass.  EXTREMITIES: No cyanosis, clubbing or edema b/l.    NEUROLOGIC: Intubated and encephalopathic PSYCHIATRIC: Intubated.  And encephalopathic SKIN: No obvious rash, lesion, or ulcer.    LABORATORY PANEL:   CBC Recent Labs  Lab 08/10/18 0357  WBC 13.6*  HGB 10.7*  HCT 35.0*  PLT 250   ------------------------------------------------------------------------------------------------------------------  Chemistries  Recent Labs  Lab 08/06/18 0450  08/10/18 0357  NA 144   < > 147*  K 3.4*   < > 4.3  CL 109   < > 113*  CO2 29   < > 31  GLUCOSE 392*   < > 294*  BUN 18   < > 25*  CREATININE 0.95   < > 1.10  CALCIUM 7.9*   < > 7.9*  MG  --   --  2.3  AST 22  --   --   ALT 22  --   --   ALKPHOS 105  --   --   BILITOT 0.4  --   --    < > =  values in this interval not displayed.   ------------------------------------------------------------------------------------------------------------------  Cardiac Enzymes No results for input(s): TROPONINI in the last 168 hours. ------------------------------------------------------------------------------------------------------------------  RADIOLOGY:  No results found.   ASSESSMENT AND PLAN:    61 year old male with past medical history significant for CAD, CHF, CKD, dementia, diabetes, hypertension and seizure disorder presents to hospital secondary to worsening weakness and poor appetite  1.  Acute respiratory failure with hypoxia-patient was extubated on August 02, 2018 and reintubated on 07 August 2018.  Suspected to be secondary to aspiration pneumonia. -Continue vent support and pulmonary toileting and further care as per intensivist. - Continue IV cefepime, Flagyl for the pneumonia.  2.  Aspiration pneumonia-source of patient's worsening respiratory failure with hypoxemia. -Continue IV cefepime, Flagyl, follow cultures which are (-) so far.   3.  Altered mental status/encephalopathy-etiology unclear.  CT head has been (-) for acute pathology. -Patient's LP showed no evidence of bacterial meningitis or herpes encephalitis. -Seen by infectious disease and neurology.  Etiology still remains unclear.  There was some concern for possible Wynelle Cleveland but that has been ruled out too.  - Patient to have repeat MRI/EEG.  EEG was done yesterday showing generalized irregular slowing consistent with encephalopathy but no acute seizure type activity. - Patient remains off sedation and still remains encephalopathic and does not follow any commands. -Continue empiric Lamictal, Vimpat as per neurology. - hold sedative meds.    4.  Dementia-continue Namenda, aricept.  5.  Essential hypertension-continue metoprolol.  6.  Diabetes type 2 without complication-continue Levemir, NovoLog sliding scale  7. GERD - cont. Protonix.    Pt's prognosis is quite poor and Palliative care spoke to pt's wife and she does not want them seeing the patient anymore.    All the records are reviewed and case discussed with Care Management/Social Worker. Management plans discussed with the patient, family and they are in agreement.  CODE STATUS: Full code  DVT Prophylaxis: Lovenox  TOTAL TIME TAKING CARE OF THIS PATIENT: 30 minutes.   POSSIBLE D/C unclear DEPENDING ON CLINICAL CONDITION.   Henreitta Leber M.D on 08/10/2018 at 4:06 PM  Between 7am to 6pm - Pager - [phone]  After 6pm go to [email or web address] - Proofreader  [facility]  Office   [phone]  CC: Primary care physician; [name], MD

## 2018-08-10 NOTE — Progress Notes (Signed)
Inpatient Diabetes Program Recommendations  AACE/ADA: New Consensus Statement on Inpatient Glycemic Control (2015)  Target Ranges:  Prepandial:   less than 140 mg/dL      Peak postprandial:   less than 180 mg/dL (1-2 hours)      Critically ill patients:  140 - 180 mg/dL   Lab Results  Component Value Date   GLUCAP 230 (H) 08/10/2018   HGBA1C 8.9 (H) 04/04/2016    Review of Glycemic Control Results for [name], Maurice Patterson (MRN [id number]) as of 08/10/2018 11:07  Ref. Range 08/09/2018 16:03 08/09/2018 19:15 08/09/2018 23:15 08/10/2018 04:08 08/10/2018 07:22  Glucose-Capillary Latest Ref Range: 70 - 99 mg/dL 266 (H) 252 (H) 243 (H) 277 (H) 230 (H)   Diabetes history: DM 2 Outpatient Diabetes medications: NPH 32 units bid, Novolin R-34 units tid with meals Current orders for Inpatient glycemic control: Novolog moderate q 4 hours, Levemir 12 units bid, Novolog 3 units q 4 hours Inpatient Diabetes Program Recommendations:   Patient received a total of 42 units of Novolog correction in the past 24 hours and 18 units of Novolog tube feed coverage.  Please consider increasing Levemir to 20 units bid.  Also consider increasing Novolog tube feed coverage to 5 units q 4 hours.   Thanks,  Adah Perl, RN, BC-ADM Inpatient Diabetes Coordinator Pager [phone] (8a-5p)

## 2018-08-11 ENCOUNTER — Inpatient Hospital Stay: Payer: PPO

## 2018-08-11 LAB — BASIC METABOLIC PANEL
Anion gap: 7 (ref 5–15)
BUN: 26 mg/dL — ABNORMAL HIGH (ref 8–23)
CO2: 29 mmol/L (ref 22–32)
CREATININE: 0.92 mg/dL (ref 0.61–1.24)
Calcium: 8 mg/dL — ABNORMAL LOW (ref 8.9–10.3)
Chloride: 114 mmol/L — ABNORMAL HIGH (ref 98–111)
GFR calc Af Amer: >60 mL/min (ref >60)
GFR calc non Af Amer: >60 mL/min (ref >60)
Glucose, Bld: 165 mg/dL — ABNORMAL HIGH (ref 70–99)
Potassium: 3.3 mmol/L — ABNORMAL LOW (ref 3.5–5.1)
Sodium: 150 mmol/L — ABNORMAL HIGH (ref 135–145)

## 2018-08-11 LAB — GLUCOSE, CAPILLARY
Glucose-Capillary: 138 mg/dL — ABNORMAL HIGH (ref 70–99)
Glucose-Capillary: 142 mg/dL — ABNORMAL HIGH (ref 70–99)
Glucose-Capillary: 198 mg/dL — ABNORMAL HIGH (ref 70–99)
Glucose-Capillary: 191 mg/dL — ABNORMAL HIGH (ref 70–99)
Glucose-Capillary: 163 mg/dL — ABNORMAL HIGH (ref 70–99)

## 2018-08-11 LAB — ALBUMIN: ALBUMIN: 2.3 g/dL — AB (ref 3.5–5.0)

## 2018-08-11 MED ORDER — DONEPEZIL HCL 5 MG PO TABS
10.0000 mg | ORAL_TABLET | Freq: Two times a day (BID) | ORAL | Status: DC
  Administered 2018-08-11 – 2018-09-08 (×53): 10 mg
  Filled 2018-08-11 (×60): qty 2

## 2018-08-11 MED ORDER — METRONIDAZOLE IN NACL 5-0.79 MG/ML-% IV SOLN
500.0000 mg | Freq: Three times a day (TID) | INTRAVENOUS | Status: DC
  Administered 2018-08-11 – 2018-08-15 (×12): 500 mg via INTRAVENOUS
  Filled 2018-08-11 (×15): qty 100

## 2018-08-11 MED ORDER — POTASSIUM CHLORIDE 20 MEQ PO PACK
40.0000 meq | PACK | Freq: Once | ORAL | Status: AC
  Administered 2018-08-11: 40 meq
  Filled 2018-08-11: qty 2

## 2018-08-11 MED ORDER — FREE WATER
180.0000 mL | Status: DC
  Administered 2018-08-11 – 2018-08-14 (×17): 180 mL

## 2018-08-11 MED ORDER — MEMANTINE HCL 5 MG PO TABS
10.0000 mg | ORAL_TABLET | Freq: Two times a day (BID) | ORAL | Status: DC
  Administered 2018-08-11 – 2018-09-08 (×53): 10 mg
  Filled 2018-08-11: qty 2
  Filled 2018-08-11: qty 1
  Filled 2018-08-11: qty 2
  Filled 2018-08-11: qty 1
  Filled 2018-08-11: qty 2
  Filled 2018-08-11: qty 1
  Filled 2018-08-11: qty 2
  Filled 2018-08-11 (×2): qty 1
  Filled 2018-08-11: qty 2
  Filled 2018-08-11: qty 1
  Filled 2018-08-11: qty 2
  Filled 2018-08-11 (×7): qty 1
  Filled 2018-08-11: qty 2
  Filled 2018-08-11 (×4): qty 1
  Filled 2018-08-11: qty 2
  Filled 2018-08-11: qty 1
  Filled 2018-08-11 (×2): qty 2
  Filled 2018-08-11 (×5): qty 1
  Filled 2018-08-11 (×2): qty 2
  Filled 2018-08-11 (×2): qty 1
  Filled 2018-08-11 (×2): qty 2
  Filled 2018-08-11 (×8): qty 1
  Filled 2018-08-11 (×2): qty 2
  Filled 2018-08-11: qty 1
  Filled 2018-08-11: qty 2
  Filled 2018-08-11 (×3): qty 1
  Filled 2018-08-11 (×2): qty 2
  Filled 2018-08-11: qty 1

## 2018-08-11 MED ORDER — POTASSIUM CHLORIDE 20 MEQ PO PACK: 40.0000 meq | PACK | Freq: Once | ORAL | Status: DC

## 2018-08-11 MED ORDER — BISACODYL 10 MG RE SUPP
10.0000 mg | Freq: Once | RECTAL | Status: AC
  Administered 2018-08-11: 10 mg via RECTAL
  Filled 2018-08-11: qty 1

## 2018-08-11 MED ORDER — ACETAMINOPHEN 325 MG PO TABS
650.0000 mg | ORAL_TABLET | Freq: Four times a day (QID) | ORAL | Status: DC | PRN
  Administered 2018-08-19: 650 mg
  Filled 2018-08-11: qty 2

## 2018-08-11 MED ORDER — SENNOSIDES-DOCUSATE SODIUM 8.6-50 MG PO TABS
2.0000 | ORAL_TABLET | Freq: Two times a day (BID) | ORAL | Status: DC
  Administered 2018-08-11 – 2018-08-14 (×7): 2
  Filled 2018-08-11 (×7): qty 2

## 2018-08-11 MED ORDER — ACETAMINOPHEN 650 MG RE SUPP: 650.0000 mg | Freq: Four times a day (QID) | RECTAL | Status: DC | PRN

## 2018-08-11 NOTE — Progress Notes (Signed)
[place] [facility] [place] NAME: Maurice Patterson    MR#:  [id number]  DATE OF BIRTH:  [birth year]  SUBJECTIVE:   Patient remains off sedation but does not follow any commands.  No other acute events overnight.  REVIEW OF SYSTEMS:    Review of Systems  Unable to perform ROS: Intubated    Nutrition: Tube feeds Tolerating Diet: yes   DRUG ALLERGIES:   Allergies  Allergen Reactions  . Hydrocodone Anaphylaxis  . Morphine Other (See Comments)    Loss of memory  . Ambien [Zolpidem] Other (See Comments)    delirium    . Brilinta [Ticagrelor] Other (See Comments)    Stroke   . Flexeril [Cyclobenzaprine] Other (See Comments)    delerium   . Flunitrazepam Other (See Comments)    ROHYPNOL (hallucinations)  . Haldol [Haloperidol Lactate] Other (See Comments)    delerium   . Levetiracetam Diarrhea and Other (See Comments)    Unable to walk  . Lorazepam Hives  . Risperdal [Risperidone] Other (See Comments)    Delirium   . Trazodone Other (See Comments)    Delirium Can take in low doses   . Benadryl [Diphenhydramine Hcl (Sleep)] Rash  . Penicillins Rash    Mouth ulcers Has patient had a PCN reaction causing immediate rash, facial/tongue/throat swelling, SOB or lightheadedness with hypotension: Yes Has patient had a PCN reaction causing severe rash involving mucus membranes or skin necrosis: No Has patient had a PCN reaction that required hospitalization No Has patient had a PCN reaction occurring within the last 10 years: Yes If all of the above answers are "NO", then may proceed with Cephalosporin use.    VITALS:  Blood pressure 114/68, pulse 84, temperature 99.5 F (37.5 C), temperature source Axillary, resp. rate 19, height 5\' 6"  (1.676 m), weight 64.7 kg, SpO2 100 %.  PHYSICAL EXAMINATION:   Physical Exam  GENERAL:  62 y.o.-year-old patient lying in bed Encephalopathic and intubated.   EYES: Pupils equal, round, reactive  to light. No scleral icterus.  HEENT: Head atraumatic, normocephalic. ET and OG tube in place.   NECK:  Supple, no jugular venous distention. No thyroid enlargement, no tenderness.  LUNGS: Poor Resp. effort, no wheezing, rales, rhonchi. No use of accessory muscles of respiration.  CARDIOVASCULAR: S1, S2 normal. No murmurs, rubs, or gallops.  ABDOMEN: Soft, nontender, nondistended. Bowel sounds present. No organomegaly or mass.  EXTREMITIES: No cyanosis, clubbing or edema b/l.    NEUROLOGIC: Intubated and encephalopathic PSYCHIATRIC: Intubated.  And encephalopathic SKIN: No obvious rash, lesion, or ulcer.    LABORATORY PANEL:   CBC Recent Labs  Lab 08/10/18 0357  WBC 13.6*  HGB 10.7*  HCT 35.0*  PLT 250   ------------------------------------------------------------------------------------------------------------------  Chemistries  Recent Labs  Lab 08/06/18 0450  08/10/18 0357 08/11/18 0348  NA 144   < > 147* 150*  K 3.4*   < > 4.3 3.3*  CL 109   < > 113* 114*  CO2 29   < > 31 29  GLUCOSE 392*   < > 294* 165*  BUN 18   < > 25* 26*  CREATININE 0.95   < > 1.10 0.92  CALCIUM 7.9*   < > 7.9* 8.0*  MG  --   --  2.3  --   AST 22  --   --   --   ALT 22  --   --   --   [facility]  105  --   --   --   BILITOT 0.4  --   --   --    < > = values in this interval not displayed.   ------------------------------------------------------------------------------------------------------------------  Cardiac Enzymes No results for input(s): TROPONINI in the last 168 hours. ------------------------------------------------------------------------------------------------------------------  RADIOLOGY:  No results found.   ASSESSMENT AND PLAN:    62 year old male with past medical history significant for CAD, CHF, CKD, dementia, diabetes, hypertension and seizure disorder presents to hospital secondary to worsening weakness and poor appetite  1.  Acute respiratory failure with  hypoxia-patient was extubated on August 02, 2018 and reintubated on 07 August 2018.  Suspected to be secondary to aspiration pneumonia. -Continue vent support and pulmonary toileting and further care as per intensivist. - Continue IV cefepime, Flagyl for the pneumonia. - due to pt's AMS/encephalopathy pt. May need Trach  2.  Aspiration pneumonia-source of patient's worsening respiratory failure with hypoxemia. -Continue IV cefepime, Flagyl, follow cultures which are (-) so far.   3.  Altered mental status/encephalopathy-etiology unclear.  CT head has been (-) for acute pathology. -Patient's LP showed no evidence of bacterial meningitis or herpes encephalitis. -Seen by infectious disease and neurology.  Etiology still remains unclear.  There was some concern for possible Wynelle Cleveland but that has been ruled out too.  - Patient to have repeat MRI/EEG.  EEG was done yesterday showing generalized irregular slowing consistent with encephalopathy but no acute seizure type activity. - Patient remains off sedation and still remains encephalopathic and does not follow any commands. -Continue empiric Lamictal, Vimpat as per neurology. - hold sedative meds.    4. Hypernatremia - will increase free water flushes with tube feeds and follow sodium.   5. Hypokalemia - will cont. Potassium supplements per ICU replacement protocol.   5.  Dementia-continue Namenda, aricept.  6.  Essential hypertension-continue metoprolol.  7.  Diabetes type 2 without complication-continue Levemir, NovoLog every 4 hrs and SSI  8. GERD - cont. Protonix.    Pt's prognosis is quite poor and Palliative care spoke to pt's wife and she does not want them seeing the patient anymore.    All the records are reviewed and case discussed with Care Management/Social Worker. Management plans discussed with the patient, family and they are in agreement.  CODE STATUS: Full code  DVT Prophylaxis: Lovenox  TOTAL TIME TAKING  CARE OF THIS PATIENT: 30 minutes.   POSSIBLE D/C unclear DEPENDING ON CLINICAL CONDITION.   Henreitta Leber M.D on 08/11/2018 at 4:59 PM  Between 7am to 6pm - Pager - [phone]  After 6pm go to [email or web address] - Proofreader  [facility]  Office  [phone]  CC: Primary care physician; [name], MD

## 2018-08-11 NOTE — Progress Notes (Signed)
Name: Maurice Patterson MRN: [id number] DOB: Jun 15, 1956     CONSULTATION DATE: 07/22/2018  REFERRING MD : Jannifer Franklin  CHIEF COMPLAINT:   Follow up resp failure     HISTORY OF PRESENT ILLNESS:   +aspiration pneumonia Remains obtunded No sedation given last night On full vent support Remains critically ill Unable to protect airway   Allergies  Allergen Reactions  . Hydrocodone Anaphylaxis  . Morphine Other (See Comments)    Loss of memory  . Ambien [Zolpidem] Other (See Comments)    delirium    . Brilinta [Ticagrelor] Other (See Comments)    Stroke   . Flexeril [Cyclobenzaprine] Other (See Comments)    delerium   . Flunitrazepam Other (See Comments)    ROHYPNOL (hallucinations)  . Haldol [Haloperidol Lactate] Other (See Comments)    delerium   . Levetiracetam Diarrhea and Other (See Comments)    Unable to walk  . Lorazepam Hives  . Risperdal [Risperidone] Other (See Comments)    Delirium   . Trazodone Other (See Comments)    Delirium Can take in low doses   . Benadryl [Diphenhydramine Hcl (Sleep)] Rash  . Penicillins Rash    Mouth ulcers Has patient had a PCN reaction causing immediate rash, facial/tongue/throat swelling, SOB or lightheadedness with hypotension: Yes Has patient had a PCN reaction causing severe rash involving mucus membranes or skin necrosis: No Has patient had a PCN reaction that required hospitalization No Has patient had a PCN reaction occurring within the last 10 years: Yes If all of the above answers are "NO", then may proceed with Cephalosporin use.     REVIEW OF SYSTEMS:   Unable to obtain due to critical illness   VITAL SIGNS: Temp:  [97.9 F (36.6 C)-101.2 F (38.4 C)] 99.1 F (37.3 C) (12/20 0400) Pulse Rate:  [76-112] 79 (12/20 0630) Resp:  [13-23] 17 (12/20 0630) BP: (94-160)/(55-81) 104/62 (12/20 0630) SpO2:  [98 %-100 %] 100 % (12/20 0630) FiO2 (%):  [30 %] 30 % (12/20 0403) Weight:  [64.7 kg] 64.7 kg (12/20  0356)  PHYSICAL EXAMINATION:  GENERAL:critically ill appearing, +resp distress HEAD: Normocephalic, atraumatic.  EYES: Pupils equal, round, reactive to light.  No scleral icterus.  MOUTH: Moist mucosal membrane. NECK: Supple. No thyromegaly. No nodules. No JVD.  PULMONARY: +rhonchi, +wheezing CARDIOVASCULAR: S1 and S2. Regular rate and rhythm. No murmurs, rubs, or gallops.  GASTROINTESTINAL: Soft, nontender, -distended. No masses. Positive bowel sounds. No hepatosplenomegaly.  MUSCULOSKELETAL: No swelling, clubbing, or edema.  NEUROLOGIC: obtunded SKIN:intact,warm,dry          ASSESSMENT / PLAN:  Severe Hypoxic and Hypercapnic Respiratory Failure-not improving -continue Mechanical Ventilator support -continue Bronchodilator Therapy -Wean Fio2 and PEEP as tolerated -VAP/VENT bundle implementation -will perform SAT/SBT when respiratory parameters are met   INFECTIOUS DISEASE -continue antibiotics as prescribed -follow up cultures +aspiration pneumonia    NEUROLOGY-severe encephalopathy not improving - intubated and sedated - minimal sedation to achieve a RASS goal: -1 Avoid all sedatives  GI/Nutrition GI PROPHYLAXIS as indicated DIET-->TF's as tolerated Constipation protocol as indicated  ELECTROLYTES -follow labs as needed -replace as needed -pharmacy consultation and following    DVT/GI PRX ordered TRANSFUSIONS AS NEEDED MONITOR FSBS ASSESS the need for LABS as needed    Critical Care Time devoted to patient care services described in this note is 34 minutes.   Overall, patient is critically ill, prognosis is guarded.  Patient with Multiorgan failure and at high risk for cardiac arrest and death.  Corrin Parker, M.D.  [facility]  Medical Director [place] Director [facility] Cardio-Pulmonary Department

## 2018-08-11 NOTE — Progress Notes (Signed)
Pharmacy Monitoring Consult:  Pharmacy consulted to assist in monitoring and replacing electrolytes in this 62 y.o. male admitted on 07/22/2018 with CAP.   Patient is currently off all sedation.   Labs:  Sodium (mmol/L)  Date Value  08/11/2018 150 (H)  10/20/2016 146 (H)  12/17/2014 138   Potassium (mmol/L)  Date Value  08/11/2018 3.3 (L)  12/17/2014 3.7   Magnesium (mg/dL)  Date Value  08/10/2018 2.3  12/17/2014 1.8   Phosphorus (mg/dL)  Date Value  08/10/2018 2.9   Calcium (mg/dL)  Date Value  08/11/2018 8.0 (L)   Calcium, Total (mg/dL)  Date Value  12/17/2014 7.9 (L)   Albumin (g/dL)  Date Value  08/06/2018 2.5 (L)  10/20/2016 4.5  12/17/2014 3.2 (L)    Assessment/Plan: 1. Electrolytes: Potassium 90mEq VT x 1. Free water flushes increased to 17mL Q4hr. Will obtain follow up electrolytes with am labs. Will replace for goal potassium ~ 4, goal magnesium ~ 2, and goal phosphorus > 2.   2. Constipation: Continue senna/docusate 2 tabs VT BID. Continue Miralax VT Daily. Bisacodyl suppository x 1.   3. Glucose: Continue novolog 5 units Q4hr, SSI, and Levemir 20 units BID.    Pharmacy will continue to monitor and adjust per consult.   Wesson Stith L 08/11/2018 3:18 PM

## 2018-08-12 ENCOUNTER — Inpatient Hospital Stay: Payer: PPO

## 2018-08-12 DIAGNOSIS — L8990 Pressure ulcer of unspecified site, unspecified stage: Secondary | ICD-10-CM

## 2018-08-12 DIAGNOSIS — R1310 Dysphagia, unspecified: Secondary | ICD-10-CM

## 2018-08-12 LAB — COMPREHENSIVE METABOLIC PANEL
ALT: 20 U/L (ref 0–44)
AST: 20 U/L (ref 15–41)
Albumin: 2.4 g/dL — ABNORMAL LOW (ref 3.5–5.0)
Alkaline Phosphatase: 93 U/L (ref 38–126)
Anion gap: 4 — ABNORMAL LOW (ref 5–15)
BUN: 21 mg/dL (ref 8–23)
CO2: 30 mmol/L (ref 22–32)
Calcium: 7.8 mg/dL — ABNORMAL LOW (ref 8.9–10.3)
Chloride: 113 mmol/L — ABNORMAL HIGH (ref 98–111)
Creatinine, Ser: 0.85 mg/dL (ref 0.61–1.24)
GFR calc Af Amer: >60 mL/min (ref >60)
GFR calc non Af Amer: >60 mL/min (ref >60)
Glucose, Bld: 168 mg/dL — ABNORMAL HIGH (ref 70–99)
Potassium: 2.9 mmol/L — ABNORMAL LOW (ref 3.5–5.1)
SODIUM: 147 mmol/L — AB (ref 135–145)
Total Bilirubin: 0.6 mg/dL (ref 0.3–1.2)
Total Protein: 5.7 g/dL — ABNORMAL LOW (ref 6.5–8.1)

## 2018-08-12 LAB — CBC
HCT: 31.8 % — ABNORMAL LOW (ref 39.0–52.0)
Hemoglobin: 9.8 g/dL — ABNORMAL LOW (ref 13.0–17.0)
MCH: 29.4 pg (ref 26.0–34.0)
MCHC: 30.8 g/dL (ref 30.0–36.0)
MCV: 95.5 fL (ref 80.0–100.0)
Platelets: 216 10*3/uL (ref 150–400)
RBC: 3.33 MIL/uL — ABNORMAL LOW (ref 4.22–5.81)
RDW: 16.5 % — AB (ref 11.5–15.5)
WBC: 7.1 10*3/uL (ref 4.0–10.5)
nRBC: 0 % (ref 0.0–0.2)

## 2018-08-12 LAB — GLUCOSE, CAPILLARY
GLUCOSE-CAPILLARY: 101 mg/dL — AB (ref 70–99)
Glucose-Capillary: 185 mg/dL — ABNORMAL HIGH (ref 70–99)
Glucose-Capillary: 144 mg/dL — ABNORMAL HIGH (ref 70–99)
Glucose-Capillary: 118 mg/dL — ABNORMAL HIGH (ref 70–99)
Glucose-Capillary: 170 mg/dL — ABNORMAL HIGH (ref 70–99)
Glucose-Capillary: 98 mg/dL (ref 70–99)

## 2018-08-12 LAB — PHOSPHORUS: Phosphorus: 2.2 mg/dL — ABNORMAL LOW (ref 2.5–4.6)

## 2018-08-12 LAB — MAGNESIUM: Magnesium: 2.3 mg/dL (ref 1.7–2.4)

## 2018-08-12 LAB — PROTIME-INR
INR: 1.39
Prothrombin Time: 16.9 seconds — ABNORMAL HIGH (ref 11.4–15.2)

## 2018-08-12 MED ORDER — POTASSIUM & SODIUM PHOSPHATES 280-160-250 MG PO PACK
2.0000 | PACK | ORAL | Status: DC
  Filled 2018-08-12 (×2): qty 2

## 2018-08-12 MED ORDER — POTASSIUM CHLORIDE 20 MEQ PO PACK: 40.0000 meq | PACK | ORAL | Status: DC

## 2018-08-12 MED ORDER — POTASSIUM CHLORIDE 20 MEQ PO PACK
40.0000 meq | PACK | ORAL | Status: AC
  Administered 2018-08-12 (×2): 40 meq
  Filled 2018-08-12 (×2): qty 2

## 2018-08-12 MED ORDER — POTASSIUM & SODIUM PHOSPHATES 280-160-250 MG PO PACK
2.0000 | PACK | ORAL | Status: AC
  Administered 2018-08-12 – 2018-08-13 (×4): 2
  Filled 2018-08-12 (×4): qty 2

## 2018-08-12 NOTE — Progress Notes (Signed)
PT Cancellation Note  Patient Details Name: Maurice Patterson MRN: [id number] DOB: August 16, 1956   Cancelled Treatment:    Reason Eval/Treat Not Completed: Patient's level of consciousness.  Per chart review, pt currently unresponsive.  Will continue to follow acutely.  Collie Siad PT, DPT 08/12/2018, 8:53 AM  [phone]

## 2018-08-12 NOTE — Progress Notes (Signed)
Pharmacy Monitoring Consult:  Pharmacy consulted to assist in monitoring and replacing electrolytes in this 62 y.o. male admitted on 07/22/2018 with CAP.   Patient is currently off all sedation. On Tube Feeds  Labs:  Sodium (mmol/L)  Date Value  08/12/2018 147 (H)  10/20/2016 146 (H)  12/17/2014 138   Potassium (mmol/L)  Date Value  08/12/2018 2.9 (L)  12/17/2014 3.7   Magnesium (mg/dL)  Date Value  08/12/2018 2.3  12/17/2014 1.8   Phosphorus (mg/dL)  Date Value  08/12/2018 2.2 (L)   Calcium (mg/dL)  Date Value  08/12/2018 7.8 (L)   Calcium, Total (mg/dL)  Date Value  12/17/2014 7.9 (L)   Albumin (g/dL)  Date Value  08/12/2018 2.4 (L)  10/20/2016 4.5  12/17/2014 3.2 (L)    Assessment/Plan: 1. Electrolytes:  K 2.9 Mag 2.3 Phos 2.2  Scr 0.85.  Potassium 29mEq packet VT q2h x 2 . Free water flushes increased to 139mL Q4hr. Phos-NaK 2 packets Q4h x 4 doses.  Will obtain follow up electrolytes with am labs. Will replace for goal potassium ~ 4, goal magnesium ~ 2, and goal phosphorus > 2.   2. Constipation: Continue senna/docusate 2 tabs VT BID. Continue Miralax VT Daily.  +BM 12/20  3. Glucose: Continue novolog 5 units Q4hr, SSI, and Levemir 20 units BID.    Pharmacy will continue to monitor and adjust per consult.   Michaeljohn Biss A 08/12/2018 2:59 PM

## 2018-08-12 NOTE — Progress Notes (Signed)
Name: Maurice Patterson MRN: [id number] DOB: 1956-02-07     CONSULTATION DATE: 07/22/2018  REFERRING MD : Jannifer Franklin  CHIEF COMPLAINT:   Follow up resp failure     HISTORY OF PRESENT ILLNESS:   +aspiration pneumonia +severe neurological dysfunction  Unresponsive, no sedation given PS mode, unable to protect airway Remains critically ill Long discussion with wife yesterday-she may not want Trach and PEG tube   Allergies  Allergen Reactions  . Hydrocodone Anaphylaxis  . Morphine Other (See Comments)    Loss of memory  . Ambien [Zolpidem] Other (See Comments)    delirium    . Brilinta [Ticagrelor] Other (See Comments)    Stroke   . Flexeril [Cyclobenzaprine] Other (See Comments)    delerium   . Flunitrazepam Other (See Comments)    ROHYPNOL (hallucinations)  . Haldol [Haloperidol Lactate] Other (See Comments)    delerium   . Levetiracetam Diarrhea and Other (See Comments)    Unable to walk  . Lorazepam Hives  . Risperdal [Risperidone] Other (See Comments)    Delirium   . Trazodone Other (See Comments)    Delirium Can take in low doses   . Benadryl [Diphenhydramine Hcl (Sleep)] Rash  . Penicillins Rash    Mouth ulcers Has patient had a PCN reaction causing immediate rash, facial/tongue/throat swelling, SOB or lightheadedness with hypotension: Yes Has patient had a PCN reaction causing severe rash involving mucus membranes or skin necrosis: No Has patient had a PCN reaction that required hospitalization No Has patient had a PCN reaction occurring within the last 10 years: Yes If all of the above answers are "NO", then may proceed with Cephalosporin use.     REVIEW OF SYSTEMS:   Unable to obtain due to critical illness   VITAL SIGNS: Temp:  [97.3 F (36.3 C)-99.7 F (37.6 C)] 98.2 F (36.8 C) (12/21 0730) Pulse Rate:  [75-96] 87 (12/21 0800) Resp:  [15-21] 19 (12/21 0800) BP: (98-180)/(59-117) 161/79 (12/21 0800) SpO2:  [97 %-100 %] 100 % (12/21  0800) FiO2 (%):  [30 %] 30 % (12/21 0343) Weight:  [64.8 kg] 64.8 kg (12/21 0431)  PHYSICAL EXAMINATION:  GENERAL:critically ill appearing, +resp distress HEAD: Normocephalic, atraumatic.  EYES: Pupils equal, round, reactive to light.  No scleral icterus.  MOUTH: Moist mucosal membrane. NECK: Supple. No thyromegaly. No nodules. No JVD.  PULMONARY: +rhonchi, +wheezing CARDIOVASCULAR: S1 and S2. Regular rate and rhythm. No murmurs, rubs, or gallops.  GASTROINTESTINAL: Soft, nontender, -distended. No masses. Positive bowel sounds. No hepatosplenomegaly.  MUSCULOSKELETAL: No swelling, clubbing, or edema.  NEUROLOGIC: obtunded SKIN:intact,warm,dry  Anti-infectives (From admission, onward)   Start     Dose/Rate Route Frequency Ordered Stop   08/11/18 2000  metroNIDAZOLE (FLAGYL) IVPB 500 mg     500 mg 100 mL/hr over 60 Minutes Intravenous Every 8 hours 08/11/18 1508 08/18/18 1959   08/09/18 1200  metroNIDAZOLE (FLAGYL) IVPB 500 mg  Status:  Discontinued     500 mg 100 mL/hr over 60 Minutes Intravenous Every 8 hours 08/09/18 1043 08/11/18 1508   08/09/18 1100  ceFEPIme (MAXIPIME) 1 g in sodium chloride 0.9 % 100 mL IVPB     1 g 200 mL/hr over 30 Minutes Intravenous Every 12 hours 08/09/18 1046 08/16/18 0959   08/08/18 0200  Ampicillin-Sulbactam (UNASYN) 3 g in sodium chloride 0.9 % 100 mL IVPB  Status:  Discontinued     3 g 200 mL/hr over 30 Minutes Intravenous Every 8 hours 08/08/18 0154 08/09/18 1043  08/03/18 1300  metroNIDAZOLE (FLAGYL) IVPB 500 mg     500 mg 100 mL/hr over 60 Minutes Intravenous Every 8 hours 08/03/18 1242 08/05/18 0648   08/03/18 1100  metroNIDAZOLE (FLAGYL) tablet 500 mg  Status:  Discontinued     500 mg Per Tube 3 times daily 08/03/18 1046 08/03/18 1242   08/01/18 1800  metroNIDAZOLE (FLAGYL) tablet 500 mg  Status:  Discontinued     500 mg Per Tube 3 times daily 08/01/18 1750 08/02/18 1724   08/01/18 1600  metroNIDAZOLE (FLAGYL) 50 mg/ml oral suspension 500  mg  Status:  Discontinued     500 mg Per Tube 3 times daily 08/01/18 1246 08/01/18 1748   08/01/18 1300  ceFEPIme (MAXIPIME) 2 g in sodium chloride 0.9 % 100 mL IVPB     2 g 200 mL/hr over 30 Minutes Intravenous Every 12 hours 08/01/18 1248 08/04/18 2335   07/30/18 2100  acyclovir (ZOVIRAX) 700 mg in dextrose 5 % 100 mL IVPB  Status:  Discontinued     700 mg 114 mL/hr over 60 Minutes Intravenous Every 12 hours 07/30/18 0933 07/31/18 0752   07/30/18 2000  vancomycin (VANCOCIN) 1,250 mg in sodium chloride 0.9 % 250 mL IVPB  Status:  Discontinued     1,250 mg 166.7 mL/hr over 90 Minutes Intravenous Every 24 hours 07/30/18 0901 07/30/18 1204   07/30/18 2000  meropenem (MERREM) 1 g in sodium chloride 0.9 % 100 mL IVPB  Status:  Discontinued     1 g 200 mL/hr over 30 Minutes Intravenous Every 12 hours 07/30/18 0901 07/30/18 1209   07/30/18 1400  piperacillin-tazobactam (ZOSYN) IVPB 3.375 g  Status:  Discontinued     3.375 g 12.5 mL/hr over 240 Minutes Intravenous Every 8 hours 07/30/18 1305 08/01/18 1246   07/29/18 0600  vancomycin (VANCOCIN) 1,500 mg in sodium chloride 0.9 % 500 mL IVPB  Status:  Discontinued     1,500 mg 250 mL/hr over 120 Minutes Intravenous Every 12 hours 07/29/18 0531 07/30/18 0843   07/29/18 0600  meropenem (MERREM) 1 g in sodium chloride 0.9 % 100 mL IVPB  Status:  Discontinued     1 g 200 mL/hr over 30 Minutes Intravenous Every 8 hours 07/29/18 0531 07/30/18 0901   07/28/18 1135  cefTRIAXone (ROCEPHIN) 1 g in sodium chloride 0.9 % 100 mL IVPB  Status:  Discontinued     1 g 200 mL/hr over 30 Minutes Intravenous Every 24 hours 07/27/18 1836 07/29/18 0529   07/27/18 1800  vancomycin (VANCOCIN) IVPB 1000 mg/200 mL premix  Status:  Discontinued     1,000 mg 200 mL/hr over 60 Minutes Intravenous Every 12 hours 07/27/18 0805 07/27/18 1836   07/27/18 1351  vancomycin (VANCOCIN) IVPB 1000 mg/200 mL premix  Status:  Discontinued     1,000 mg 200 mL/hr over 60 Minutes  Intravenous Every 8 hours 07/27/18 0724 07/27/18 0805   07/25/18 1800  vancomycin (VANCOCIN) IVPB 1000 mg/200 mL premix  Status:  Discontinued     1,000 mg 200 mL/hr over 60 Minutes Intravenous Every 12 hours 07/25/18 1132 07/27/18 0724   07/25/18 1600  ampicillin (OMNIPEN) 2 g in sodium chloride 0.9 % 100 mL IVPB  Status:  Discontinued     2 g 300 mL/hr over 20 Minutes Intravenous Every 4 hours 07/25/18 1501 07/27/18 1836   07/25/18 1230  acyclovir (ZOVIRAX) 700 mg in dextrose 5 % 100 mL IVPB  Status:  Discontinued     700 mg 114 mL/hr  over 60 Minutes Intravenous Every 8 hours 07/25/18 1117 07/30/18 0933   07/25/18 1200  cefTRIAXone (ROCEPHIN) 2 g in sodium chloride 0.9 % 100 mL IVPB  Status:  Discontinued     2 g 200 mL/hr over 30 Minutes Intravenous Every 12 hours 07/25/18 1042 07/27/18 1836   07/25/18 1100  vancomycin (VANCOCIN) IVPB 1000 mg/200 mL premix     1,000 mg 200 mL/hr over 60 Minutes Intravenous STAT 07/25/18 1049 07/25/18 1246   07/23/18 0400  azithromycin (ZITHROMAX) 500 mg in sodium chloride 0.9 % 250 mL IVPB     500 mg 250 mL/hr over 60 Minutes Intravenous Every 24 hours 07/23/18 0122 07/25/18 0449   07/23/18 0300  cefTRIAXone (ROCEPHIN) 1 g in sodium chloride 0.9 % 100 mL IVPB  Status:  Discontinued     1 g 200 mL/hr over 30 Minutes Intravenous Every 24 hours 07/23/18 0122 07/25/18 1042   07/23/18 0000  cefTRIAXone (ROCEPHIN) 1 g in sodium chloride 0.9 % 100 mL IVPB  Status:  Discontinued     1 g 200 mL/hr over 30 Minutes Intravenous  Once 07/22/18 2348 07/23/18 0122   07/23/18 0000  azithromycin (ZITHROMAX) 500 mg in sodium chloride 0.9 % 250 mL IVPB  Status:  Discontinued     500 mg 250 mL/hr over 60 Minutes Intravenous  Once 07/22/18 2348 07/23/18 0122             ASSESSMENT / PLAN:  Severe Hypoxic and Hypercapnic Respiratory Failure-not improving Unable to protect airway -continue Mechanical Ventilator support -continue Bronchodilator Therapy -Wean  Fio2 and PEEP as tolerated -VAP/VENT bundle implementation -will perform SAT/SBT when respiratory parameters are met   INFECTIOUS DISEASE -continue antibiotics as prescribed -follow up cultures +aspiration pneumonia Cefepime and flagyl   NEUROLOGY-severe muscle weakness, intermittent encephalopathy Signs similar to ALS Avoid all sedatives - intubated Progressive neurological dysfuntion and decline over last 6 months Poor prognosis      GI/Nutrition GI PROPHYLAXIS as indicated DIET-->TF's as tolerated Constipation protocol as indicated   ELECTROLYTES -follow labs as needed -replace as needed -pharmacy consultation and following    DVT/GI PRX ordered TRANSFUSIONS AS NEEDED MONITOR FSBS ASSESS the need for LABS as needed    Critical Care Time devoted to patient care services described in this note is 36 minutes.   Overall, patient is critically ill, prognosis is guarded.  high risk for cardiac arrest and death.   Patient will likely need Trach and PEG tube to survive Wife doesn't quite grasp the severity of patients condition, she may not want Trach/PEG   Corrin Parker, M.D.  [facility]  Medical Director [place] Director [facility] Cardio-Pulmonary Department

## 2018-08-12 NOTE — Progress Notes (Signed)
[facility] [place] NAME: Maurice Patterson    MR#:  [id number]  DATE OF BIRTH:  [birth year]  SUBJECTIVE:   Patient remains off sedation but does not follow any commands.  No other acute events overnight.  REVIEW OF SYSTEMS:    Review of Systems  Unable to perform ROS: Intubated    Nutrition: Tube feeds Tolerating Diet: yes   DRUG ALLERGIES:   Allergies  Allergen Reactions  . Hydrocodone Anaphylaxis  . Morphine Other (See Comments)    Loss of memory  . Ambien [Zolpidem] Other (See Comments)    delirium    . Brilinta [Ticagrelor] Other (See Comments)    Stroke   . Flexeril [Cyclobenzaprine] Other (See Comments)    delerium   . Flunitrazepam Other (See Comments)    ROHYPNOL (hallucinations)  . Haldol [Haloperidol Lactate] Other (See Comments)    delerium   . Levetiracetam Diarrhea and Other (See Comments)    Unable to walk  . Lorazepam Hives  . Risperdal [Risperidone] Other (See Comments)    Delirium   . Trazodone Other (See Comments)    Delirium Can take in low doses   . Benadryl [Diphenhydramine Hcl (Sleep)] Rash  . Penicillins Rash    Mouth ulcers Has patient had a PCN reaction causing immediate rash, facial/tongue/throat swelling, SOB or lightheadedness with hypotension: Yes Has patient had a PCN reaction causing severe rash involving mucus membranes or skin necrosis: No Has patient had a PCN reaction that required hospitalization No Has patient had a PCN reaction occurring within the last 10 years: Yes If all of the above answers are "NO", then may proceed with Cephalosporin use.    VITALS:  Blood pressure 139/76, pulse 84, temperature 98.5 F (36.9 C), temperature source Oral, resp. rate 16, height 5\' 6"  (1.676 m), weight 64.8 kg, SpO2 100 %.  PHYSICAL EXAMINATION:   Physical Exam  GENERAL:  62 y.o.-year-old patient lying in bed Encephalopathic and intubated.   EYES: Pupils equal, round, reactive to  light. No scleral icterus.  HEENT: Head atraumatic, normocephalic. ET and OG tube in place.   NECK:  Supple, no jugular venous distention. No thyroid enlargement, no tenderness.  LUNGS: Poor Resp. effort, no wheezing, rales, rhonchi. No use of accessory muscles of respiration.  CARDIOVASCULAR: S1, S2 normal. No murmurs, rubs, or gallops.  ABDOMEN: Soft, nontender, nondistended. Bowel sounds present. No organomegaly or mass.  EXTREMITIES: No cyanosis, clubbing or edema b/l.    NEUROLOGIC: Intubated and encephalopathic PSYCHIATRIC: Intubated.  And encephalopathic SKIN: No obvious rash, lesion, or ulcer.    LABORATORY PANEL:   CBC Recent Labs  Lab 08/12/18 0522  WBC 7.1  HGB 9.8*  HCT 31.8*  PLT 216   ------------------------------------------------------------------------------------------------------------------  Chemistries  Recent Labs  Lab 08/12/18 0431  NA 147*  K 2.9*  CL 113*  CO2 30  GLUCOSE 168*  BUN 21  CREATININE 0.85  CALCIUM 7.8*  MG 2.3  AST 20  ALT 20  ALKPHOS 93  BILITOT 0.6   ------------------------------------------------------------------------------------------------------------------  Cardiac Enzymes No results for input(s): TROPONINI in the last 168 hours. ------------------------------------------------------------------------------------------------------------------  RADIOLOGY:  Ct Abdomen Pelvis Wo Contrast  Result Date: 08/11/2018 CLINICAL DATA:  Abdominal mass. EXAM: CT ABDOMEN AND PELVIS WITHOUT CONTRAST TECHNIQUE: Multidetector CT imaging of the abdomen and pelvis was performed following the standard protocol without IV contrast. COMPARISON:  04/14/2017. FINDINGS: Lower chest: The heart size is normal. No substantial pericardial effusion. Coronary artery  calcification is evident. Right lower lobe collapse/consolidation is associated with minimal atelectasis or infiltrate in the posterior left base and a 13 mm anterior left lower lobe  pulmonary nodule. Hepatobiliary: No focal abnormality in the liver on this study without intravenous contrast. Gallbladder is distended. No intrahepatic or extrahepatic biliary dilation. Pancreas: Pancreatic duct is dilated in the body of pancreas measuring 6 mm, but not substantially changed since prior study. Pancreatic calcification is predominantly noted in the tail of pancreas and suggest chronic pancreatitis. Spleen: No splenomegaly. No focal mass lesion. Adrenals/Urinary Tract: No adrenal nodule or mass. Kidneys unremarkable. No evidence for hydroureter. Bladder decompressed by Foley catheter. Stomach/Bowel: A feeding tube is looped in the proximal stomach with the tip in the gastric fundus. Duodenum is normally positioned as is the ligament of Treitz. No small bowel wall thickening. No small bowel dilatation. The terminal ileum is normal. The appendix is normal. No gross colonic mass. No colonic wall thickening. There is a markedly large stool volume in the rectum with rectal diameter measuring 9 cm in coronal diameter and 7.4 cm in AP diameter. Vascular/Lymphatic: There is abdominal aortic atherosclerosis without aneurysm. There is no gastrohepatic or hepatoduodenal ligament lymphadenopathy. No intraperitoneal or retroperitoneal lymphadenopathy. No pelvic sidewall lymphadenopathy. Reproductive: The prostate gland and seminal vesicles have normal imaging features. Other: No intraperitoneal free fluid. Musculoskeletal: Small bilateral groin hernias contain only fat. Gas bubble in the subcutaneous fat of the lower left anterior abdominal wall is probably an injection site. No worrisome lytic or sclerotic osseous abnormality. IMPRESSION: 1. No evidence for abdominal mass. 2. Markedly large stool volume in a dilated rectum. Fecal impaction would be a consideration. 3. Bilateral lower lobe collapse/consolidation, right greater than left. 13 mm nodular density in the left lower lobe may be infectious/inflammatory.  Attention on follow-up recommended. 4. Diffuse pancreatic atrophy with diffuse dilatation of the main pancreatic duct. This is associated with pancreatic parenchymal calcification, predominantly in the tail. Imaging features are stable since prior study and remain compatible with chronic pancreatitis. 5. The feeding tube is looped in the stomach with the tip in the gastric fundus. 6.  Aortic Atherosclerois (ICD10-170.0) Electronically Signed   By: Misty Stanley M.D.   On: 08/11/2018 20:24   Dg Chest Port 1 View  Result Date: 08/12/2018 CLINICAL DATA:  Acute respiratory failure. EXAM: PORTABLE CHEST 1 VIEW COMPARISON:  08/08/2018 FINDINGS: Endotracheal tube has tip 3.2 cm above the carina. Enteric tube is coiled once over the stomach with tip over the gastric fundus in the left upper quadrant. Lungs are adequately inflated without focal airspace consolidation, effusion or pneumothorax. Cardiomediastinal silhouette and remainder of the exam is unchanged. IMPRESSION: No acute cardiopulmonary disease. Tubes and lines as described. Electronically Signed   By: Marin Olp M.D.   On: 08/12/2018 08:15     ASSESSMENT AND PLAN:    62 year old male with past medical history significant for CAD, CHF, CKD, dementia, diabetes, hypertension and seizure disorder presents to hospital secondary to worsening weakness and poor appetite  1.  Acute respiratory failure with hypoxia-patient was extubated on August 02, 2018 and reintubated on 07 August 2018.  Suspected to be secondary to aspiration pneumonia. -Continue vent support and pulmonary toileting and further care as per intensivist. - Continue IV cefepime, Flagyl for the pneumonia. - due to pt's AMS/encephalopathy pt. May need Trach  2.  Aspiration pneumonia-source of patient's worsening respiratory failure with hypoxemia. -Continue IV cefepime, Flagyl, follow cultures which are (-) so far.   3.  Altered mental status/encephalopathy-etiology unclear.   CT head has been (-) for acute pathology. -Patient's LP showed no evidence of bacterial meningitis or herpes encephalitis. -Seen by infectious disease and neurology.  Etiology still remains unclear.  There was some concern for possible Wynelle Cleveland but that has been ruled out too.  - Patient to have repeat MRI/EEG.  EEG was done yesterday showing generalized irregular slowing consistent with encephalopathy but no acute seizure type activity. - Patient remains off sedation and still remains encephalopathic and does not follow any commands. -Continue empiric Lamictal, Vimpat as per neurology. - hold sedative meds.    4. Hypernatremia - will increase free water flushes with tube feeds and follow sodium.   5. Hypokalemia - will cont. Potassium supplements per ICU replacement protocol.   5.  Dementia-continue Namenda, aricept.  6.  Essential hypertension-continue metoprolol.  7.  Diabetes type 2 without complication-continue Levemir, NovoLog every 4 hrs and SSI  8. GERD - cont. Protonix.    Pt's prognosis is quite poor   CODE STATUS: Full code  DVT Prophylaxis: Lovenox  TOTAL TIME TAKING CARE OF THIS PATIENT: 30 minutes.   POSSIBLE D/C unclear DEPENDING ON CLINICAL CONDITION.   Fritzi Mandes M.D on 08/12/2018 at 3:05 PM  Between 7am to 6pm - Pager - [phone]  After 6pm go to [email or web address] - Proofreader  [facility]  Office  [phone]  CC: Primary care physician; [name], MD

## 2018-08-13 LAB — BASIC METABOLIC PANEL
Anion gap: 6 (ref 5–15)
BUN: 18 mg/dL (ref 8–23)
CO2: 27 mmol/L (ref 22–32)
Calcium: 7.7 mg/dL — ABNORMAL LOW (ref 8.9–10.3)
Chloride: 111 mmol/L (ref 98–111)
Creatinine, Ser: 0.67 mg/dL (ref 0.61–1.24)
GFR calc Af Amer: >60 mL/min (ref >60)
Glucose, Bld: 162 mg/dL — ABNORMAL HIGH (ref 70–99)
Potassium: 3.7 mmol/L (ref 3.5–5.1)
Sodium: 144 mmol/L (ref 135–145)

## 2018-08-13 LAB — GLUCOSE, CAPILLARY
Glucose-Capillary: 122 mg/dL — ABNORMAL HIGH (ref 70–99)
Glucose-Capillary: 128 mg/dL — ABNORMAL HIGH (ref 70–99)
Glucose-Capillary: 137 mg/dL — ABNORMAL HIGH (ref 70–99)
Glucose-Capillary: 139 mg/dL — ABNORMAL HIGH (ref 70–99)
Glucose-Capillary: 164 mg/dL — ABNORMAL HIGH (ref 70–99)
Glucose-Capillary: 171 mg/dL — ABNORMAL HIGH (ref 70–99)

## 2018-08-13 LAB — PHOSPHORUS: Phosphorus: 3.1 mg/dL (ref 2.5–4.6)

## 2018-08-13 LAB — TRIGLYCERIDES: TRIGLYCERIDES: 51 mg/dL (ref <150)

## 2018-08-13 NOTE — Progress Notes (Signed)
PT Cancellation Note  Patient Details Name: Maurice Patterson MRN: [id number] DOB: [birth year]   Cancelled Treatment:    Reason Eval/Treat Not Completed: Fatigue/lethargy limiting ability to participate.  Attempted to see pt for PT evaluation; however, pt unable to keep eyes open despite verbal and tactile cues from this therapist and family.  Pt not following commands. Even with attempt of PROM pt demonstrates rigidity BUEs and thus unable to assess ROM.  RN reports she has already attempted PROM with same results.  Will continue to follow acutely.   Collie Siad PT, DPT 08/13/2018, 1:39 PM

## 2018-08-13 NOTE — Progress Notes (Signed)
Patient has been asleep most of the shift. Has opened eyes and was responsive, but not following commands as much as he did on day shift. Asked NP about parameters for scheduled insulin and has been getting 2 units with the sliding scale. Good urinary output. Had one period of diaphoresis, but temp was 97.6 and glucose not too high or low. Had bath, and turned and repositioned as needed. Continue on pressure support and tolerating. Wife was at bedside last pm and will be back this AM. Remains off sedation and plan is to keep patient off sedation for a week and re-evaluate. Continue to monitor.

## 2018-08-13 NOTE — Progress Notes (Signed)
Pharmacy Monitoring Consult:  Pharmacy consulted to assist in monitoring and replacing electrolytes in this 62 y.o. male admitted on 07/22/2018 with CAP.   Patient is currently off all sedation. On Tube Feeds  Labs:  Sodium (mmol/L)  Date Value  08/13/2018 144  10/20/2016 146 (H)  12/17/2014 138   Potassium (mmol/L)  Date Value  08/13/2018 3.7  12/17/2014 3.7   Magnesium (mg/dL)  Date Value  08/12/2018 2.3  12/17/2014 1.8   Phosphorus (mg/dL)  Date Value  08/13/2018 3.1   Calcium (mg/dL)  Date Value  08/13/2018 7.7 (L)   Calcium, Total (mg/dL)  Date Value  12/17/2014 7.9 (L)   Albumin (g/dL)  Date Value  08/12/2018 2.4 (L)  10/20/2016 4.5  12/17/2014 3.2 (L)    Assessment/Plan: 1. Electrolytes:  K 3.7  Phos 3.1  Scr 0.67.  No supplementation at this time.  Will obtain follow up electrolytes with am labs. Will replace for goal potassium ~ 4, goal magnesium ~ 2, and goal phosphorus > 2.   2. Constipation: Continue senna/docusate 2 tabs VT BID. Continue Miralax VT Daily.  +BM 12/20  3. Glucose: Continue novolog 5 units Q4hr, SSI q4h, and Levemir 20 units BID.    Pharmacy will continue to monitor and adjust per consult.   Conda Wannamaker A 08/13/2018 8:31 AM

## 2018-08-13 NOTE — Progress Notes (Signed)
Name: Maurice Patterson MRN: [id number] DOB: June 12, 1956     CONSULTATION DATE: 07/22/2018  REFERRING MD : Jannifer Franklin  CHIEF COMPLAINT:   Follow up severe resp failure     HISTORY OF PRESENT ILLNESS:   +aspiration pneumonia +severe neurological weakness and dysfunction  reasoning more, showing signs of severe muscle weakness Unable to protect airway Wife wants trial of extubation in 1-2 days  PS mode, unable to protect airway Remains full code Trach and PEG tube if he fails extubation Remains critically ill  Allergies  Allergen Reactions  . Hydrocodone Anaphylaxis  . Morphine Other (See Comments)    Loss of memory  . Ambien [Zolpidem] Other (See Comments)    delirium    . Brilinta [Ticagrelor] Other (See Comments)    Stroke   . Flexeril [Cyclobenzaprine] Other (See Comments)    delerium   . Flunitrazepam Other (See Comments)    ROHYPNOL (hallucinations)  . Haldol [Haloperidol Lactate] Other (See Comments)    delerium   . Levetiracetam Diarrhea and Other (See Comments)    Unable to walk  . Lorazepam Hives  . Risperdal [Risperidone] Other (See Comments)    Delirium   . Trazodone Other (See Comments)    Delirium Can take in low doses   . Benadryl [Diphenhydramine Hcl (Sleep)] Rash  . Penicillins Rash    Mouth ulcers Has patient had a PCN reaction causing immediate rash, facial/tongue/throat swelling, SOB or lightheadedness with hypotension: Yes Has patient had a PCN reaction causing severe rash involving mucus membranes or skin necrosis: No Has patient had a PCN reaction that required hospitalization No Has patient had a PCN reaction occurring within the last 10 years: Yes If all of the above answers are "NO", then may proceed with Cephalosporin use.     REVIEW OF SYSTEMS:   Unable to obtain due to critical illness   VITAL SIGNS: Temp:  [97.6 F (36.4 C)-98.7 F (37.1 C)] 97.8 F (36.6 C) (12/22 0735) Pulse Rate:  [79-93] 79 (12/22 0700) Resp:   [12-24] 16 (12/22 0700) BP: (106-159)/(64-99) 127/68 (12/22 0700) SpO2:  [98 %-100 %] 99 % (12/22 0700) FiO2 (%):  [30 %] 30 % (12/22 0800) Weight:  [69.1 kg] 69.1 kg (12/22 0412)  PHYSICAL EXAMINATION:  GENERAL:critically ill appearing, +resp distress HEAD: Normocephalic, atraumatic.  EYES: Pupils equal, round, reactive to light.  No scleral icterus.  MOUTH: Moist mucosal membrane. NECK: Supple. No thyromegaly. No nodules. No JVD.  PULMONARY: +rhonchi,  CARDIOVASCULAR: S1 and S2. Regular rate and rhythm. No murmurs, rubs, or gallops.  GASTROINTESTINAL: Soft, nontender, -distended. No masses. Positive bowel sounds. No hepatosplenomegaly.  MUSCULOSKELETAL: No swelling, clubbing, or edema.  NEUROLOGIC: obtunded SKIN:intact,warm,dry    Anti-infectives (From admission, onward)   Start     Dose/Rate Route Frequency Ordered Stop   08/11/18 2000  metroNIDAZOLE (FLAGYL) IVPB 500 mg     500 mg 100 mL/hr over 60 Minutes Intravenous Every 8 hours 08/11/18 1508 08/18/18 1959   08/09/18 1200  metroNIDAZOLE (FLAGYL) IVPB 500 mg  Status:  Discontinued     500 mg 100 mL/hr over 60 Minutes Intravenous Every 8 hours 08/09/18 1043 08/11/18 1508   08/09/18 1100  ceFEPIme (MAXIPIME) 1 g in sodium chloride 0.9 % 100 mL IVPB     1 g 200 mL/hr over 30 Minutes Intravenous Every 12 hours 08/09/18 1046 08/16/18 0959   08/08/18 0200  Ampicillin-Sulbactam (UNASYN) 3 g in sodium chloride 0.9 % 100 mL IVPB  Status:  Discontinued  3 g 200 mL/hr over 30 Minutes Intravenous Every 8 hours 08/08/18 0154 08/09/18 1043   08/03/18 1300  metroNIDAZOLE (FLAGYL) IVPB 500 mg     500 mg 100 mL/hr over 60 Minutes Intravenous Every 8 hours 08/03/18 1242 08/05/18 0648   08/03/18 1100  metroNIDAZOLE (FLAGYL) tablet 500 mg  Status:  Discontinued     500 mg Per Tube 3 times daily 08/03/18 1046 08/03/18 1242   08/01/18 1800  metroNIDAZOLE (FLAGYL) tablet 500 mg  Status:  Discontinued     500 mg Per Tube 3 times daily  08/01/18 1750 08/02/18 1724   08/01/18 1600  metroNIDAZOLE (FLAGYL) 50 mg/ml oral suspension 500 mg  Status:  Discontinued     500 mg Per Tube 3 times daily 08/01/18 1246 08/01/18 1748   08/01/18 1300  ceFEPIme (MAXIPIME) 2 g in sodium chloride 0.9 % 100 mL IVPB     2 g 200 mL/hr over 30 Minutes Intravenous Every 12 hours 08/01/18 1248 08/04/18 2335   07/30/18 2100  acyclovir (ZOVIRAX) 700 mg in dextrose 5 % 100 mL IVPB  Status:  Discontinued     700 mg 114 mL/hr over 60 Minutes Intravenous Every 12 hours 07/30/18 0933 07/31/18 0752   07/30/18 2000  vancomycin (VANCOCIN) 1,250 mg in sodium chloride 0.9 % 250 mL IVPB  Status:  Discontinued     1,250 mg 166.7 mL/hr over 90 Minutes Intravenous Every 24 hours 07/30/18 0901 07/30/18 1204   07/30/18 2000  meropenem (MERREM) 1 g in sodium chloride 0.9 % 100 mL IVPB  Status:  Discontinued     1 g 200 mL/hr over 30 Minutes Intravenous Every 12 hours 07/30/18 0901 07/30/18 1209   07/30/18 1400  piperacillin-tazobactam (ZOSYN) IVPB 3.375 g  Status:  Discontinued     3.375 g 12.5 mL/hr over 240 Minutes Intravenous Every 8 hours 07/30/18 1305 08/01/18 1246   07/29/18 0600  vancomycin (VANCOCIN) 1,500 mg in sodium chloride 0.9 % 500 mL IVPB  Status:  Discontinued     1,500 mg 250 mL/hr over 120 Minutes Intravenous Every 12 hours 07/29/18 0531 07/30/18 0843   07/29/18 0600  meropenem (MERREM) 1 g in sodium chloride 0.9 % 100 mL IVPB  Status:  Discontinued     1 g 200 mL/hr over 30 Minutes Intravenous Every 8 hours 07/29/18 0531 07/30/18 0901   07/28/18 1135  cefTRIAXone (ROCEPHIN) 1 g in sodium chloride 0.9 % 100 mL IVPB  Status:  Discontinued     1 g 200 mL/hr over 30 Minutes Intravenous Every 24 hours 07/27/18 1836 07/29/18 0529   07/27/18 1800  vancomycin (VANCOCIN) IVPB 1000 mg/200 mL premix  Status:  Discontinued     1,000 mg 200 mL/hr over 60 Minutes Intravenous Every 12 hours 07/27/18 0805 07/27/18 1836   07/27/18 1351  vancomycin (VANCOCIN)  IVPB 1000 mg/200 mL premix  Status:  Discontinued     1,000 mg 200 mL/hr over 60 Minutes Intravenous Every 8 hours 07/27/18 0724 07/27/18 0805   07/25/18 1800  vancomycin (VANCOCIN) IVPB 1000 mg/200 mL premix  Status:  Discontinued     1,000 mg 200 mL/hr over 60 Minutes Intravenous Every 12 hours 07/25/18 1132 07/27/18 0724   07/25/18 1600  ampicillin (OMNIPEN) 2 g in sodium chloride 0.9 % 100 mL IVPB  Status:  Discontinued     2 g 300 mL/hr over 20 Minutes Intravenous Every 4 hours 07/25/18 1501 07/27/18 1836   07/25/18 1230  acyclovir (ZOVIRAX) 700 mg in dextrose  5 % 100 mL IVPB  Status:  Discontinued     700 mg 114 mL/hr over 60 Minutes Intravenous Every 8 hours 07/25/18 1117 07/30/18 0933   07/25/18 1200  cefTRIAXone (ROCEPHIN) 2 g in sodium chloride 0.9 % 100 mL IVPB  Status:  Discontinued     2 g 200 mL/hr over 30 Minutes Intravenous Every 12 hours 07/25/18 1042 07/27/18 1836   07/25/18 1100  vancomycin (VANCOCIN) IVPB 1000 mg/200 mL premix     1,000 mg 200 mL/hr over 60 Minutes Intravenous STAT 07/25/18 1049 07/25/18 1246   07/23/18 0400  azithromycin (ZITHROMAX) 500 mg in sodium chloride 0.9 % 250 mL IVPB     500 mg 250 mL/hr over 60 Minutes Intravenous Every 24 hours 07/23/18 0122 07/25/18 0449   07/23/18 0300  cefTRIAXone (ROCEPHIN) 1 g in sodium chloride 0.9 % 100 mL IVPB  Status:  Discontinued     1 g 200 mL/hr over 30 Minutes Intravenous Every 24 hours 07/23/18 0122 07/25/18 1042   07/23/18 0000  cefTRIAXone (ROCEPHIN) 1 g in sodium chloride 0.9 % 100 mL IVPB  Status:  Discontinued     1 g 200 mL/hr over 30 Minutes Intravenous  Once 07/22/18 2348 07/23/18 0122   07/23/18 0000  azithromycin (ZITHROMAX) 500 mg in sodium chloride 0.9 % 250 mL IVPB  Status:  Discontinued     500 mg 250 mL/hr over 60 Minutes Intravenous  Once 07/22/18 2348 07/23/18 0122             ASSESSMENT / PLAN:   Severe Hypoxic and Hypercapnic Respiratory Failure -continue Mechanical  Ventilator support -continue Bronchodilator Therapy -Wean Fio2 and PEEP as tolerated -VAP/VENT bundle implementation -will perform SAT/SBT when respiratory parameters are met  INFECTIOUS DISEASE -continue antibiotics as prescribed -follow up cultures +aspiration pneumonia Cefepime and Flagyl  Neurology-severe muscle weakness, intermittent encephalopathy Signs very similar to ALS Avoid all sedatives Remains intubated Unable to protect airway Progressive neurological dysfunction and decline over last 6 months Poor prognosis   GI/Nutrition GI PROPHYLAXIS as indicated DIET-->TF's as tolerated Constipation protocol as indicated   ELECTROLYTES -follow labs as needed -replace as needed -pharmacy consultation and following     DVT/GI PRX ordered TRANSFUSIONS AS NEEDED MONITOR FSBS ASSESS the need for LABS as needed      Critical Care Time devoted to patient care services described in this note is 35 minutes.   Overall, patient is critically ill, prognosis is guarded.   Wife wants trial of extubation in 1-2 days and of he fails will plan for re-intubation with trach and PEG tube.    Corrin Parker, M.D.  [facility]  Medical Director [place] Director [facility] Cardio-Pulmonary Department

## 2018-08-13 NOTE — Progress Notes (Signed)
[place] [facility] [place] NAME: Maurice Patterson    MR#:  [id number]  DATE OF BIRTH:  08/02/1956  SUBJECTIVE:   Patient remains off sedation but does not follow any commands.  No other acute events overnight. Failing trials of weaning  REVIEW OF SYSTEMS:    Review of Systems  Unable to perform ROS: Intubated    Nutrition: Tube feeds Tolerating Diet: yes   DRUG ALLERGIES:   Allergies  Allergen Reactions  . Hydrocodone Anaphylaxis  . Morphine Other (See Comments)    Loss of memory  . Ambien [Zolpidem] Other (See Comments)    delirium    . Brilinta [Ticagrelor] Other (See Comments)    Stroke   . Flexeril [Cyclobenzaprine] Other (See Comments)    delerium   . Flunitrazepam Other (See Comments)    ROHYPNOL (hallucinations)  . Haldol [Haloperidol Lactate] Other (See Comments)    delerium   . Levetiracetam Diarrhea and Other (See Comments)    Unable to walk  . Lorazepam Hives  . Risperdal [Risperidone] Other (See Comments)    Delirium   . Trazodone Other (See Comments)    Delirium Can take in low doses   . Benadryl [Diphenhydramine Hcl (Sleep)] Rash  . Penicillins Rash    Mouth ulcers Has patient had a PCN reaction causing immediate rash, facial/tongue/throat swelling, SOB or lightheadedness with hypotension: Yes Has patient had a PCN reaction causing severe rash involving mucus membranes or skin necrosis: No Has patient had a PCN reaction that required hospitalization No Has patient had a PCN reaction occurring within the last 10 years: Yes If all of the above answers are "NO", then may proceed with Cephalosporin use.    VITALS:  Blood pressure 134/72, pulse 86, temperature 97.8 F (36.6 C), temperature source Axillary, resp. rate 18, height 5\' 6"  (1.676 m), weight 69.1 kg, SpO2 100 %.  PHYSICAL EXAMINATION:   Physical Exam  GENERAL:  62 y.o.-year-old patient lying in bed Encephalopathic and intubated.   EYES:  Pupils equal, round, reactive to light. No scleral icterus.  HEENT: Head atraumatic, normocephalic. ET and OG tube in place.   NECK:  Supple, no jugular venous distention. No thyroid enlargement, no tenderness.  LUNGS: Poor Resp. effort, no wheezing, rales, rhonchi. No use of accessory muscles of respiration.  CARDIOVASCULAR: S1, S2 normal. No murmurs, rubs, or gallops.  ABDOMEN: Soft, nontender, nondistended.few Bowel sounds present. No organomegaly or mass.  EXTREMITIES: No cyanosis, clubbing or edema b/l.    NEUROLOGIC: Intubated and encephalopathic PSYCHIATRIC: Intubated.  And encephalopathic SKIN: No obvious rash, lesion, or ulcer.    LABORATORY PANEL:   CBC Recent Labs  Lab 08/12/18 0522  WBC 7.1  HGB 9.8*  HCT 31.8*  PLT 216   ------------------------------------------------------------------------------------------------------------------  Chemistries  Recent Labs  Lab 08/12/18 0431 08/13/18 0410  NA 147* 144  K 2.9* 3.7  CL 113* 111  CO2 30 27  GLUCOSE 168* 162*  BUN 21 18  CREATININE 0.85 0.67  CALCIUM 7.8* 7.7*  MG 2.3  --   AST 20  --   ALT 20  --   ALKPHOS 93  --   BILITOT 0.6  --    ------------------------------------------------------------------------------------------------------------------  Cardiac Enzymes No results for input(s): TROPONINI in the last 168 hours. ------------------------------------------------------------------------------------------------------------------  RADIOLOGY:  Ct Abdomen Pelvis Wo Contrast  Result Date: 08/11/2018 CLINICAL DATA:  Abdominal mass. EXAM: CT ABDOMEN AND PELVIS WITHOUT CONTRAST TECHNIQUE: Multidetector CT imaging of the  abdomen and pelvis was performed following the standard protocol without IV contrast. COMPARISON:  04/14/2017. FINDINGS: Lower chest: The heart size is normal. No substantial pericardial effusion. Coronary artery calcification is evident. Right lower lobe collapse/consolidation is  associated with minimal atelectasis or infiltrate in the posterior left base and a 13 mm anterior left lower lobe pulmonary nodule. Hepatobiliary: No focal abnormality in the liver on this study without intravenous contrast. Gallbladder is distended. No intrahepatic or extrahepatic biliary dilation. Pancreas: Pancreatic duct is dilated in the body of pancreas measuring 6 mm, but not substantially changed since prior study. Pancreatic calcification is predominantly noted in the tail of pancreas and suggest chronic pancreatitis. Spleen: No splenomegaly. No focal mass lesion. Adrenals/Urinary Tract: No adrenal nodule or mass. Kidneys unremarkable. No evidence for hydroureter. Bladder decompressed by Foley catheter. Stomach/Bowel: A feeding tube is looped in the proximal stomach with the tip in the gastric fundus. Duodenum is normally positioned as is the ligament of Treitz. No small bowel wall thickening. No small bowel dilatation. The terminal ileum is normal. The appendix is normal. No gross colonic mass. No colonic wall thickening. There is a markedly large stool volume in the rectum with rectal diameter measuring 9 cm in coronal diameter and 7.4 cm in AP diameter. Vascular/Lymphatic: There is abdominal aortic atherosclerosis without aneurysm. There is no gastrohepatic or hepatoduodenal ligament lymphadenopathy. No intraperitoneal or retroperitoneal lymphadenopathy. No pelvic sidewall lymphadenopathy. Reproductive: The prostate gland and seminal vesicles have normal imaging features. Other: No intraperitoneal free fluid. Musculoskeletal: Small bilateral groin hernias contain only fat. Gas bubble in the subcutaneous fat of the lower left anterior abdominal wall is probably an injection site. No worrisome lytic or sclerotic osseous abnormality. IMPRESSION: 1. No evidence for abdominal mass. 2. Markedly large stool volume in a dilated rectum. Fecal impaction would be a consideration. 3. Bilateral lower lobe  collapse/consolidation, right greater than left. 13 mm nodular density in the left lower lobe may be infectious/inflammatory. Attention on follow-up recommended. 4. Diffuse pancreatic atrophy with diffuse dilatation of the main pancreatic duct. This is associated with pancreatic parenchymal calcification, predominantly in the tail. Imaging features are stable since prior study and remain compatible with chronic pancreatitis. 5. The feeding tube is looped in the stomach with the tip in the gastric fundus. 6.  Aortic Atherosclerois (ICD10-170.0) Electronically Signed   By: Misty Stanley M.D.   On: 08/11/2018 20:24   Dg Chest Port 1 View  Result Date: 08/12/2018 CLINICAL DATA:  Acute respiratory failure. EXAM: PORTABLE CHEST 1 VIEW COMPARISON:  08/08/2018 FINDINGS: Endotracheal tube has tip 3.2 cm above the carina. Enteric tube is coiled once over the stomach with tip over the gastric fundus in the left upper quadrant. Lungs are adequately inflated without focal airspace consolidation, effusion or pneumothorax. Cardiomediastinal silhouette and remainder of the exam is unchanged. IMPRESSION: No acute cardiopulmonary disease. Tubes and lines as described. Electronically Signed   By: Marin Olp M.D.   On: 08/12/2018 08:15     ASSESSMENT AND PLAN:    62 year old male with past medical history significant for CAD, CHF, CKD, dementia, diabetes, hypertension and seizure disorder presents to hospital secondary to worsening weakness and poor appetite  1.  Acute respiratory failure with hypoxia-patient was extubated on August 02, 2018 and reintubated on 07 August 2018.  Suspected to be secondary to aspiration pneumonia. -Continue vent support and pulmonary toileting and further care as per intensivist. - Continue IV cefepime, Flagyl for the aspiration pneumonia. - due to pt's AMS/encephalopathy  pt. May need Trach and PEG  2.  Aspiration pneumonia-source of patient's worsening respiratory failure with  hypoxemia. -Continue IV cefepime, Flagyl, follow cultures which are (-) so far.   3.  Altered mental status/encephalopathy-etiology unclear.  - CT head has been (-) for acute pathology. -Patient's LP showed no evidence of bacterial meningitis or herpes encephalitis. -Seen by infectious disease and neurology.  Etiology still remains unclear.  There was some concern for possible Wynelle Cleveland but that has been ruled out too.  - Patient to have repeat MRI/EEG.  - EEG was done showing generalized irregular slowing consistent with encephalopathy but no acute seizure type activity. - Patient remains off sedation and still remains encephalopathic and does not follow any commands. -Continue empiric Lamictal, Vimpat as per neurology. - hold sedative meds.    4. Hypernatremia - resolved  5. Hypokalemia - repleted -Potassium supplements per ICU replacement protocol.   5.  Dementia-continue Namenda, aricept.  6.  Essential hypertension-continue metoprolol.  7.  Diabetes type 2 without complication-continue Levemir, NovoLog every 4 hrs and SSI  8. GERD - cont. Protonix.    Pt's prognosis is quite poor   CODE STATUS: Full code  DVT Prophylaxis: Lovenox  TOTAL TIME TAKING CARE OF THIS PATIENT: 25 minutes.   POSSIBLE D/C unclear DEPENDING ON CLINICAL CONDITION.   Fritzi Mandes M.D on 08/13/2018 at 1:34 PM  Between 7am to 6pm - Pager - [phone]  After 6pm go to [email or web address] - Proofreader  [facility]  Office  [phone]  CC: Primary care physician; [name], MD

## 2018-08-14 ENCOUNTER — Ambulatory Visit: Payer: Self-pay | Admitting: Neurology

## 2018-08-14 DIAGNOSIS — R29898 Other symptoms and signs involving the musculoskeletal system: Secondary | ICD-10-CM

## 2018-08-14 LAB — BASIC METABOLIC PANEL
Anion gap: 10 (ref 5–15)
BUN: 15 mg/dL (ref 8–23)
CO2: 26 mmol/L (ref 22–32)
Calcium: 7.6 mg/dL — ABNORMAL LOW (ref 8.9–10.3)
Chloride: 104 mmol/L (ref 98–111)
Creatinine, Ser: 0.59 mg/dL — ABNORMAL LOW (ref 0.61–1.24)
GFR calc Af Amer: >60 mL/min (ref >60)
Glucose, Bld: 137 mg/dL — ABNORMAL HIGH (ref 70–99)
Potassium: 3.8 mmol/L (ref 3.5–5.1)
Sodium: 140 mmol/L (ref 135–145)

## 2018-08-14 LAB — CBC
HCT: 31.8 % — ABNORMAL LOW (ref 39.0–52.0)
Hemoglobin: 9.9 g/dL — ABNORMAL LOW (ref 13.0–17.0)
MCH: 29.2 pg (ref 26.0–34.0)
MCHC: 31.1 g/dL (ref 30.0–36.0)
MCV: 93.8 fL (ref 80.0–100.0)
PLATELETS: 217 10*3/uL (ref 150–400)
RBC: 3.39 MIL/uL — ABNORMAL LOW (ref 4.22–5.81)
RDW: 16.4 % — AB (ref 11.5–15.5)
WBC: 6.8 10*3/uL (ref 4.0–10.5)
nRBC: 0 % (ref 0.0–0.2)

## 2018-08-14 LAB — PHOSPHORUS: Phosphorus: 3.2 mg/dL (ref 2.5–4.6)

## 2018-08-14 LAB — GLUCOSE, CAPILLARY
GLUCOSE-CAPILLARY: 121 mg/dL — AB (ref 70–99)
Glucose-Capillary: 133 mg/dL — ABNORMAL HIGH (ref 70–99)
Glucose-Capillary: 166 mg/dL — ABNORMAL HIGH (ref 70–99)
Glucose-Capillary: 172 mg/dL — ABNORMAL HIGH (ref 70–99)
Glucose-Capillary: 178 mg/dL — ABNORMAL HIGH (ref 70–99)
Glucose-Capillary: 181 mg/dL — ABNORMAL HIGH (ref 70–99)
Glucose-Capillary: 47 mg/dL — ABNORMAL LOW (ref 70–99)
Glucose-Capillary: 58 mg/dL — ABNORMAL LOW (ref 70–99)
Glucose-Capillary: 91 mg/dL (ref 70–99)

## 2018-08-14 LAB — MAGNESIUM: Magnesium: 2.1 mg/dL (ref 1.7–2.4)

## 2018-08-14 MED ORDER — BUDESONIDE 0.25 MG/2ML IN SUSP
0.2500 mg | Freq: Two times a day (BID) | RESPIRATORY_TRACT | Status: DC
  Administered 2018-08-14 – 2018-09-08 (×50): 0.25 mg via RESPIRATORY_TRACT
  Filled 2018-08-14 (×50): qty 2

## 2018-08-14 MED ORDER — DEXTROSE IN LACTATED RINGERS 5 % IV SOLN
INTRAVENOUS | Status: DC
  Administered 2018-08-14 – 2018-08-15 (×2): via INTRAVENOUS

## 2018-08-14 MED ORDER — DEXTROSE 50 % IV SOLN
INTRAVENOUS | Status: AC
  Administered 2018-08-14: 12.5 g via INTRAVENOUS
  Filled 2018-08-14: qty 50

## 2018-08-14 MED ORDER — IPRATROPIUM-ALBUTEROL 0.5-2.5 (3) MG/3ML IN SOLN
3.0000 mL | Freq: Four times a day (QID) | RESPIRATORY_TRACT | Status: DC
  Administered 2018-08-14 – 2018-08-27 (×50): 3 mL via RESPIRATORY_TRACT
  Filled 2018-08-14 (×52): qty 3

## 2018-08-14 MED ORDER — DEXTROSE 50 % IV SOLN
12.5000 g | Freq: Once | INTRAVENOUS | Status: AC
  Administered 2018-08-14: 12.5 g via INTRAVENOUS

## 2018-08-14 NOTE — Progress Notes (Signed)
PT Cancellation Note  Patient Details Name: Maurice Patterson MRN: [id number] DOB: 1956/02/18   Cancelled Treatment:    Reason Eval/Treat Not Completed: Other (comment).  Pt recently extubated this morning.  Will hold until afternoon to assess tolerance of extubation, especially given pt's h/o need for re-intubation in the past.  If pt remains appropriate, will attempt to see again later today, schedule permitting.    Collie Siad PT, DPT 08/14/2018, 10:57 AM

## 2018-08-14 NOTE — Progress Notes (Signed)
Pharmacy Monitoring Consult:  Pharmacy consulted to assist in monitoring and replacing electrolytes in this 62 y.o. male admitted on 07/22/2018 with CAP.   Patient extubated on 12/23.   Labs:  Sodium (mmol/L)  Date Value  08/14/2018 140  10/20/2016 146 (H)  12/17/2014 138   Potassium (mmol/L)  Date Value  08/14/2018 3.8  12/17/2014 3.7   Magnesium (mg/dL)  Date Value  08/14/2018 2.1  12/17/2014 1.8   Phosphorus (mg/dL)  Date Value  08/14/2018 3.2   Calcium (mg/dL)  Date Value  08/14/2018 7.6 (L)   Calcium, Total (mg/dL)  Date Value  12/17/2014 7.9 (L)   Albumin (g/dL)  Date Value  08/12/2018 2.4 (L)  10/20/2016 4.5  12/17/2014 3.2 (L)    Assessment/Plan: 1. Electrolytes:  No supplementation warranted.   2. Constipation: Last bowel movement 12/23 continue senna/docusate 2 tabs VT BID and discontinue Miralax.    3. Glucose: Patient with hypoglycemia overnight. Long acting and scheduled insulin discontinued. Tube feeds being held post extubation. Patient ordered D5LR at 8mL/hr. Patient with SSI orders; will continue to monitor.   Pharmacy will continue to monitor and adjust per consult.   Jailene Cupit L 08/14/2018 4:45 PM

## 2018-08-14 NOTE — Progress Notes (Signed)
Nutrition Follow-up  DOCUMENTATION CODES:   Not applicable  INTERVENTION:  If patient is not appropriate for swallow evaluation in the next 24-48 hours, or unable to pass swallow evaluation, recommend initiating Osmolite 1.5 Cal at 55 mL/hr + Pro-Stat 30 mL once daily per tube. Provides 2080 kcal, 98 grams of protein, 1003 mL H2O daily.  Also recommend a free water flush of 180 mL Q4hrs if tube feeds are initiated (provides 2083 mL H2O daily including water in tube feeding).   Goal TF regimen would meet 100% RDIs for vitamins/minerals.  If patient is able to pass swallow evaluation, RD will order appropriate oral nutrition supplements at that time.  NUTRITION DIAGNOSIS:   Inadequate oral intake related to (acute metabolic encephalopathy) as evidenced by meal completion < 25%.  Ongoing.  GOAL:   Provide needs based on ASPEN/SCCM guidelines  Previously met with tube feeds.  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   Significant events: -Patient was seen by this RD for initial assessment on 12/5. Received consult for TPN initiation. Patient was not an ideal candidate for TPN but patient's family was requesting nutrition be started and team felt he would not keep an NGT in place. TPN was not initiated as PICC line could not be placed. -On evening of 12/7 patient developed progressive respiratory failure with presumptive aspiration. He transferred to ICU and was initially placed on BiPAP but later required intubation. -Patient was extubated on 12/11. -On 12/13 patient went to IR for  post-pyloric feeding tube placement. Tip of tube was unable to be advanced into the duodenum so terminated in stomach. -Patient was re-intubated on 12/17 in setting of respiratory failure and worsening unresponsiveness.  Patient has continued to tolerate tube feeds well. Abdomen remains soft. Patient had a large type 6 BM this AM. He was just extubated this AM. Plan is to leave NGT in place in case it will be needed again if he is unable to pass swallow evaluation once he is appropriate for evaluation. RD will continue to monitor plan.  Enteral Access: NGT placed 12/13 by IR; tip terminates in gastric fundus per CT abd/pelvis 12/20; 80 cm at right nare  Medications reviewed and include: Novolog 0-15 units Q4hrs, Miralax 17 grams daily per tube, senna-docusate 2 tablets BID per tube, thiamine 100 mg daily per tube, cefepime, D5-LR at 50 mL/hr (60 grams dextrose, 204 kcal daily), Flagyl.  Labs reviewed: CBG 47-181, Creatinine 0.59.  Discussed with RN and on rounds.  Diet Order:   Diet Order    None     EDUCATION NEEDS:   No education needs have been identified at this time  Skin:  Skin Assessment: Skin Integrity Issues:(DTI to sacrum; ecchymosis to bilateral arms)  Last BM:  08/14/2018 - large type 6  Height:   Ht Readings from Last 1 Encounters:  07/29/18 '5\' 6"'$  (1.676 m)   Weight:   Wt Readings from Last 1 Encounters:  08/13/18 69.1 kg   Ideal Body Weight:  70 kg  BMI:  Body mass index is 24.59 kg/m.  Estimated Nutritional Needs:   Kcal:  1845-2150 (MSJ x  1.2-1.4)  Protein:  85-105 grams (1.2-1.5 grams/kg)  Fluid:  >1.9L/day   Willey Blade, MS, RD, LDN Office: [phone] Pager: [phone] After Hours/Weekend Pager: [phone]

## 2018-08-14 NOTE — Progress Notes (Signed)
[place] at [place] NAME: Maurice Patterson    MR#:  [id number]  DATE OF BIRTH:  18-Apr-1956  SUBJECTIVE:   Patient now extubated. Wife in the room. Opens eyes to verbal command does not talk much. Wife says he whispers.  REVIEW OF SYSTEMS:    Review of Systems  Unable to perform ROS: Mental status change    Nutrition: Tube feeds PT pending  DRUG ALLERGIES:   Allergies  Allergen Reactions  . Hydrocodone Anaphylaxis  . Morphine Other (See Comments)    Loss of memory  . Ambien [Zolpidem] Other (See Comments)    delirium    . Brilinta [Ticagrelor] Other (See Comments)    Stroke   . Flexeril [Cyclobenzaprine] Other (See Comments)    delerium   . Flunitrazepam Other (See Comments)    ROHYPNOL (hallucinations)  . Haldol [Haloperidol Lactate] Other (See Comments)    delerium   . Levetiracetam Diarrhea and Other (See Comments)    Unable to walk  . Lorazepam Hives  . Risperdal [Risperidone] Other (See Comments)    Delirium   . Trazodone Other (See Comments)    Delirium Can take in low doses   . Benadryl [Diphenhydramine Hcl (Sleep)] Rash  . Penicillins Rash    Mouth ulcers Has patient had a PCN reaction causing immediate rash, facial/tongue/throat swelling, SOB or lightheadedness with hypotension: Yes Has patient had a PCN reaction causing severe rash involving mucus membranes or skin necrosis: No Has patient had a PCN reaction that required hospitalization No Has patient had a PCN reaction occurring within the last 10 years: Yes If all of the above answers are "NO", then may proceed with Cephalosporin use.    VITALS:  Blood pressure 106/60, pulse 80, temperature 98.2 F (36.8 C), temperature source Oral, resp. rate 16, height 5\' 6"  (1.676 m), weight 69.1 kg, SpO2 99 %.  PHYSICAL EXAMINATION:   Physical Exam  GENERAL:  62 y.o.-year-old patient lying in bed  diheveled EYES: Pupils equal, round, reactive to light.  No scleral icterus.  HEENT: Head atraumatic, normocephalic. ET and OG tube in place.   NECK:  Supple, no jugular venous distention. No thyroid enlargement, no tenderness.  LUNGS: Poor Resp. effort, no wheezing, rales, rhonchi. No use of accessory muscles of respiration.  CARDIOVASCULAR: S1, S2 normal. No murmurs, rubs, or gallops.  ABDOMEN: Soft, nontender, nondistended.few Bowel sounds present. No organomegaly or mass.  EXTREMITIES: No cyanosis, clubbing or edema b/l.    NEUROLOGIC: unable to examine. Patient still weak just got extubated PSYCHIATRIC: open's eyes to verbal commands. SKIN: No obvious rash, lesion, or ulcer.    LABORATORY PANEL:   CBC Recent Labs  Lab 08/14/18 0434  WBC 6.8  HGB 9.9*  HCT 31.8*  PLT 217   ------------------------------------------------------------------------------------------------------------------  Chemistries  Recent Labs  Lab 08/12/18 0431  08/14/18 0434  NA 147*   < > 140  K 2.9*   < > 3.8  CL 113*   < > 104  CO2 30   < > 26  GLUCOSE 168*   < > 137*  BUN 21   < > 15  CREATININE 0.85   < > 0.59*  CALCIUM 7.8*   < > 7.6*  MG 2.3  --  2.1  AST 20  --   --   ALT 20  --   --   ALKPHOS 93  --   --   BILITOT 0.6  --   --    < > =  values in this interval not displayed.   ------------------------------------------------------------------------------------------------------------------  Cardiac Enzymes No results for input(s): TROPONINI in the last 168 hours. ------------------------------------------------------------------------------------------------------------------  RADIOLOGY:  No results found.   ASSESSMENT AND PLAN:    62 year old male with past medical history significant for CAD, CHF, CKD, dementia, diabetes, hypertension and seizure disorder presents to hospital secondary to worsening weakness and poor appetite  1.  Acute respiratory failure with hypoxia-patient was extubated on August 02, 2018 and reintubated on  07 August 2018.  Suspected to be secondary to aspiration pneumonia. -Extubated now - Continue IV cefepime, Flagyl for the aspiration pneumonia.   2.  Aspiration pneumonia-source of patient's worsening respiratory failure with hypoxemia. -Continue IV cefepime, Flagyl, follow cultures which are (-) so far.   3.  Altered mental status/encephalopathy-etiology unclear.  - CT head has been (-) for acute pathology. -Patient's LP showed no evidence of bacterial meningitis or herpes encephalitis. -Seen by infectious disease and neurology.  Etiology still remains unclear.  There was some concern for possible Wynelle Cleveland but that has been ruled out too.  - Patient to have repeat MRI/EEG.  - EEG was done showing generalized irregular slowing consistent with encephalopathy but no acute seizure type activity. -Continue empiric Lamictal, Vimpat as per neurology. - hold sedative meds.    4. Hypernatremia - resolved  5. Hypokalemia - repleted  5.  Dementia-continue Namenda, aricept.  6.  Essential hypertension-continue metoprolol.  7.  Diabetes type 2 without complication-continue Levemir, NovoLog every 4 hrs and SSI  8. GERD - cont. Protonix.    Pt's prognosis is quite poor   CODE STATUS: Full code  DVT Prophylaxis: Lovenox  TOTAL TIME TAKING CARE OF THIS PATIENT: 25 minutes.   POSSIBLE D/C unclear DEPENDING ON CLINICAL CONDITION. Discussed with wife in the room  Fritzi Mandes M.D on 08/14/2018 at 2:13 PM  Between 7am to 6pm - Pager - [phone]  After 6pm go to [email or web address] - Proofreader  [facility]  Office  [phone]  CC: Primary care physician; [name], MD

## 2018-08-14 NOTE — Progress Notes (Signed)
Patient extubated to 2l [place], per  MD request, with no complications. Patient tolerating well with saturations of 98%, no stridor noted, will continue to monitor.

## 2018-08-14 NOTE — Progress Notes (Signed)
PT Cancellation Note  Patient Details Name: Maurice Patterson MRN: [id number] DOB: 05/09/1956   Cancelled Treatment:    Reason Eval/Treat Not Completed: Other (comment).  Pt awake but not following commands to be able to participate with therapy.  The only command he follows is to stick out his tongue.  Will attempt to see pt again next date, 12/24.     Collie Siad PT, DPT 08/14/2018, 1:35 PM

## 2018-08-14 NOTE — Progress Notes (Signed)
Passed SBT this morning.  Extubated and tolerating well.  Follows commands.  Cough is rather marginal.  Vitals:   08/14/18 1330 08/14/18 1400 08/14/18 1413 08/14/18 1600  BP: 131/87 106/60    Pulse: 81 80    Resp: (!) 22 16    Temp:    (!) 97.5 F (36.4 C)  TempSrc:    Oral  SpO2: 99% 99% 98%   Weight:      Height:      2 LPM [place]  Gen: Somewhat frail-appearing, NAD HEENT: NCAT, sclera white, NGT in place Neck: No JVD noted Lungs: breath sounds full and slightly coarse without wheezes Cardiovascular: RRR, no murmurs Abdomen: Soft, nontender, normal BS Ext: without clubbing, cyanosis, edema Neuro: Diffusely weak without focal deficits Skin: Limited exam, no lesions noted   BMP Latest Ref Rng & Units 08/14/2018 08/13/2018 08/12/2018  Glucose 70 - 99 mg/dL 137(H) 162(H) 168(H)  BUN 8 - 23 mg/dL 15 18 21   Creatinine 0.61 - 1.24 mg/dL 0.59(L) 0.67 0.85  BUN/Creat Ratio 9 - 20 - - -  Sodium 135 - 145 mmol/L 140 144 147(H)  Potassium 3.5 - 5.1 mmol/L 3.8 3.7 2.9(L)  Chloride 98 - 111 mmol/L 104 111 113(H)  CO2 22 - 32 mmol/L 26 27 30   Calcium 8.9 - 10.3 mg/dL 7.6(L) 7.7(L) 7.8(L)    CBC Latest Ref Rng & Units 08/14/2018 08/12/2018 08/10/2018  WBC 4.0 - 10.5 K/uL 6.8 7.1 13.6(H)  Hemoglobin 13.0 - 17.0 g/dL 9.9(L) 9.8(L) 10.7(L)  Hematocrit 39.0 - 52.0 % 31.8(L) 31.8(L) 35.0(L)  Platelets 150 - 400 K/uL 217 216 250   CXR: No new film  IMPRESSION: Acute respiratory failure with hypoxemia and hypercarbia Prolonged ventilator dependence.  Now extubated and appears to be tolerating Aspiration pneumonia Underlying dementia Acute encephalopathy ICU acquired deconditioning  PLAN/REC: Extubated under my supervision today Monitor in ICU post extubation Supplemental oxygen as needed BiPAP as needed NTS as needed Will leave NGT in place to provide meds and nutrition Complete 7 days of cefepime and metronidazole  I updated wife in detail this morning and checked on patient on  3 occasions after extubation  CCM time: 35 mins  The above time includes time spent in consultation with patient and/or family members and reviewing care plan on multidisciplinary rounds  Merton Border, MD PCCM service Mobile [phone] Pager [phone] 08/14/2018 4:36 PM

## 2018-08-15 DIAGNOSIS — G9340 Encephalopathy, unspecified: Secondary | ICD-10-CM

## 2018-08-15 DIAGNOSIS — F0390 Unspecified dementia without behavioral disturbance: Secondary | ICD-10-CM

## 2018-08-15 LAB — BASIC METABOLIC PANEL
Anion gap: 7 (ref 5–15)
BUN: 14 mg/dL (ref 8–23)
CO2: 25 mmol/L (ref 22–32)
Calcium: 7.9 mg/dL — ABNORMAL LOW (ref 8.9–10.3)
Chloride: 106 mmol/L (ref 98–111)
Creatinine, Ser: 0.68 mg/dL (ref 0.61–1.24)
GFR calc Af Amer: >60 mL/min (ref >60)
GFR calc non Af Amer: >60 mL/min (ref >60)
Glucose, Bld: 209 mg/dL — ABNORMAL HIGH (ref 70–99)
POTASSIUM: 3.6 mmol/L (ref 3.5–5.1)
Sodium: 138 mmol/L (ref 135–145)

## 2018-08-15 LAB — GLUCOSE, CAPILLARY
Glucose-Capillary: 185 mg/dL — ABNORMAL HIGH (ref 70–99)
Glucose-Capillary: 173 mg/dL — ABNORMAL HIGH (ref 70–99)
Glucose-Capillary: 213 mg/dL — ABNORMAL HIGH (ref 70–99)
Glucose-Capillary: 172 mg/dL — ABNORMAL HIGH (ref 70–99)
Glucose-Capillary: 228 mg/dL — ABNORMAL HIGH (ref 70–99)

## 2018-08-15 LAB — BLOOD GAS, ARTERIAL
Acid-Base Excess: 5.2 mmol/L — ABNORMAL HIGH (ref 0.0–2.0)
Bicarbonate: 29 mmol/L — ABNORMAL HIGH (ref 20.0–28.0)
FIO2: 0.21
O2 Saturation: 96.2 %
Patient temperature: 37
pCO2 arterial: 39 mmHg (ref 32.0–48.0)
pH, Arterial: 7.48 — ABNORMAL HIGH (ref 7.350–7.450)
pO2, Arterial: 77 mmHg — ABNORMAL LOW (ref 83.0–108.0)

## 2018-08-15 MED ORDER — VITAL HIGH PROTEIN PO LIQD: 1000.0000 mL | ORAL | Status: DC

## 2018-08-15 MED ORDER — LACOSAMIDE 50 MG PO TABS: 200.0000 mg | ORAL_TABLET | Freq: Two times a day (BID) | ORAL | Status: DC

## 2018-08-15 MED ORDER — FREE WATER
100.0000 mL | Freq: Three times a day (TID) | Status: DC
  Administered 2018-08-15 – 2018-08-21 (×18): 100 mL

## 2018-08-15 MED ORDER — PRO-STAT SUGAR FREE PO LIQD
30.0000 mL | Freq: Every day | ORAL | Status: DC
  Administered 2018-08-16 – 2018-08-21 (×6): 30 mL

## 2018-08-15 MED ORDER — PRO-STAT SUGAR FREE PO LIQD: 30.0000 mL | Freq: Two times a day (BID) | ORAL | Status: DC

## 2018-08-15 MED ORDER — SENNOSIDES-DOCUSATE SODIUM 8.6-50 MG PO TABS
1.0000 | ORAL_TABLET | Freq: Every day | ORAL | Status: DC
  Administered 2018-08-17 – 2018-09-08 (×21): 1
  Filled 2018-08-15 (×20): qty 1

## 2018-08-15 MED ORDER — OSMOLITE 1.5 CAL PO LIQD
1000.0000 mL | ORAL | Status: DC
  Administered 2018-08-15 – 2018-08-20 (×6): 1000 mL

## 2018-08-15 NOTE — Plan of Care (Signed)
  Problem: Clinical Measurements: Goal: Respiratory complications will improve Outcome: Progressing Goal: Cardiovascular complication will be avoided Outcome: Progressing   Problem: Elimination: Goal: Will not experience complications related to bowel motility Outcome: Progressing Note:  1 BM during shift Goal: Will not experience complications related to urinary retention Outcome: Progressing Note:  Good urine output    Problem: Safety: Goal: Ability to remain free from injury will improve Outcome: Progressing   Problem: Skin Integrity: Goal: Risk for impaired skin integrity will decrease Outcome: Progressing   Problem: Health Behavior/Discharge Planning: Goal: Ability to manage health-related needs will improve Outcome: Not Met (add Reason)   Problem: Clinical Measurements: Goal: Ability to maintain clinical measurements within normal limits will improve Outcome: Not Met (add Reason) Goal: Will remain free from infection Outcome: Not Met (add Reason)   Problem: Activity: Goal: Risk for activity intolerance will decrease Outcome: Not Met (add Reason)   Problem: Nutrition: Goal: Adequate nutrition will be maintained Outcome: Not Met (add Reason) Note:  Tube feeds remain off

## 2018-08-15 NOTE — Evaluation (Signed)
Physical Therapy Evaluation Patient Details Name: Maurice Patterson MRN: [id number] DOB: Sep 23, 1955 Today's Date: 08/15/2018   History of Present Illness  62 y/o male here with pneumonia and respiratory distress has intubated (on and off) and ultimately extubated 12/23.    Clinical Impression  Pt struggled to truly wake up but did have moments where he seemed awake enough to show some effort with exercises.  Family present and very encouraging and eager to try to keep him awake as much as possible (grand kids holding eyes open, daughter with loud verbal cuing along with PT t/o the effort).  Most exercises were PROM LE acts with occasional AAROM (or reflexive?) minimal assist.  Pt with reasonable available ROM and and did not indicate pain with tasks.  Pt clearly unable to return home at this time and likely will need rehab even with improved mental status and function, recommending rehab at this point.    Follow Up Recommendations SNF    Equipment Recommendations  (TBD after rehab)    Recommendations for Other Services       Precautions / Restrictions Precautions Precautions: Fall Restrictions Weight Bearing Restrictions: No      Mobility  Bed Mobility               General bed mobility comments: pt unable to wake enough to make this a reasonable option  Transfers                 General transfer comment: all mobility deferred secondary to mental status  Ambulation/Gait                Stairs            Wheelchair Mobility    Modified Rankin (Stroke Patients Only)       Balance                                             Pertinent Vitals/Pain Pain Assessment: (pt did not indicate pain with PROM exercises)    Home Living Family/patient expects to be discharged to:: Skilled nursing facility Living Arrangements: Spouse/significant other                    Prior Function Level of Independence: (1 month ago pt was  running errands, doing all ADLs, etc)               Hand Dominance        Extremity/Trunk Assessment   Upper Extremity Assessment Upper Extremity Assessment: Generalized weakness(unable to do AROM this date)    Lower Extremity Assessment Lower Extremity Assessment: Generalized weakness(unable to test, only infrequent AROM with heavy cuing)       Communication   Communication: (non-verbal this date, lethargic)  Cognition Arousal/Alertness: Lethargic Behavior During Therapy: Flat affect Overall Cognitive Status: Impaired/Different from baseline                                 General Comments: Pt able to only briefly open eyes (Even with aggressive sternal/shoulder rubs, grandchildren holding eye lids open and daughter loudly calling out name and encouraging him to [employer])      General Comments      Exercises General Exercises - Lower Extremity Ankle Circles/Pumps: PROM;15 reps;Both Heel Slides: PROM;10 reps;Both Hip ABduction/ADduction: PROM;15 reps;Both  Assessment/Plan    PT Assessment Patient needs continued PT services  PT Problem List Decreased strength;Decreased range of motion;Decreased activity tolerance;Decreased balance;Decreased coordination;Decreased mobility;Decreased knowledge of use of DME;Decreased safety awareness;Decreased cognition       PT Treatment Interventions DME instruction;Gait training;Stair training;Functional mobility training;Therapeutic activities;Balance training;Therapeutic exercise;Neuromuscular re-education;Cognitive remediation;Patient/family education    PT Goals (Current goals can be found in the Care Plan section)  Acute Rehab PT Goals Patient Stated Goal: per daughter, get back to watching grandkids and being active PT Goal Formulation: With patient Time For Goal Achievement: 08/29/18 Potential to Achieve Goals: Fair    Frequency Min 2X/week   Barriers to discharge        Co-evaluation                AM-PAC PT "6 Clicks" Mobility  Outcome Measure Help needed turning from your back to your side while in a flat bed without using bedrails?: Total Help needed moving from lying on your back to sitting on the side of a flat bed without using bedrails?: Total Help needed moving to and from a bed to a chair (including a wheelchair)?: Total Help needed standing up from a chair using your arms (e.g., wheelchair or bedside chair)?: Total Help needed to walk in hospital room?: Total Help needed climbing 3-5 steps with a railing? : Total 6 Click Score: 6    End of Session   Activity Tolerance: Patient limited by lethargy Patient left: with bed alarm set;with call bell/phone within reach;with family/visitor present Nurse Communication: Mobility status PT Visit Diagnosis: Difficulty in walking, not elsewhere classified (R26.2);Muscle weakness (generalized) (M62.81)    Time: 1601-0932 PT Time Calculation (min) (ACUTE ONLY): 22 min   Charges:   PT Evaluation $PT Eval Low Complexity: 1 Low          Kreg Shropshire, DPT 08/15/2018, 11:45 AM

## 2018-08-15 NOTE — Progress Notes (Signed)
No respiratory distress.  Intermittently with diminished responsiveness.  However, will follow commands when encouraged to do so vigorously  Vitals:   08/15/18 1200 08/15/18 1300 08/15/18 1400 08/15/18 1500  BP: 127/74 128/65 129/72 117/73  Pulse: 83 80 80 81  Resp: (!) 21 (!) 22 20 20   Temp:   97.7 F (36.5 C)   TempSrc:   Oral   SpO2: 96% 96% 98% 99%  Weight:      Height:      2 LPM [place]  Gen:  NAD HEENT: NCAT, sclera white, NGT in place Neck: No JVD noted Lungs: Clear anteriorly Cardiovascular: Giller, no M Abdomen: Soft, NT, NABS Ext: No C/C/E Neuro: No focal deficit Skin: Limited exam, no lesions noted   BMP Latest Ref Rng & Units 08/15/2018 08/14/2018 08/13/2018  Glucose 70 - 99 mg/dL 209(H) 137(H) 162(H)  BUN 8 - 23 mg/dL 14 15 18   Creatinine 0.61 - 1.24 mg/dL 0.68 0.59(L) 0.67  BUN/Creat Ratio 9 - 20 - - -  Sodium 135 - 145 mmol/L 138 140 144  Potassium 3.5 - 5.1 mmol/L 3.6 3.8 3.7  Chloride 98 - 111 mmol/L 106 104 111  CO2 22 - 32 mmol/L 25 26 27   Calcium 8.9 - 10.3 mg/dL 7.9(L) 7.6(L) 7.7(L)    CBC Latest Ref Rng & Units 08/14/2018 08/12/2018 08/10/2018  WBC 4.0 - 10.5 K/uL 6.8 7.1 13.6(H)  Hemoglobin 13.0 - 17.0 g/dL 9.9(L) 9.8(L) 10.7(L)  Hematocrit 39.0 - 52.0 % 31.8(L) 31.8(L) 35.0(L)  Platelets 150 - 400 K/uL 217 216 250   CXR: No new film  IMPRESSION: Acute respiratory failure with hypoxemia and hypercarbia, resolving Prolonged ventilator dependence.  Appears to be resolved Aspiration pneumonia, appears to be fully treated Underlying dementia History of HSV encephalitis Acute encephalopathy ICU acquired deconditioning  PLAN/REC: Change to SDU status Continue supplemental oxygen as needed Continue NTS as needed Discontinue further antibiotics at this time  Will leave NGT in place to provide meds and nutrition Advance activity as able.  PT involved   Merton Border, MD PCCM service Mobile [phone] Pager [phone] 08/15/2018 4:28  PM

## 2018-08-15 NOTE — Clinical Social Work Note (Signed)
Clinical Social Work Assessment  Patient Details  Name: Maurice Patterson MRN: [id number] Date of Birth: 1955/11/15  Date of referral:  08/15/18               Reason for consult:  Discharge Planning                Permission sought to share information with:    Permission granted to share information::     Name::        Agency::     Relationship::     Contact Information:     Housing/Transportation Living arrangements for the past 2 months:  Single Family Home Source of Information:  Spouse Patient Interpreter Needed:  None Criminal Activity/Legal Involvement Pertinent to Current Situation/Hospitalization:  No - Comment as needed Significant Relationships:  Adult Children, Spouse Lives with:  Spouse Do you feel safe going back to the place where you live?    Need for family participation in patient care:  Yes (Comment)  Care giving concerns:  Patient at baseline is independent with ADL's and patient lives with spouse: Garek Schuneman: [phone].    Social Worker assessment / plan:  CSW spoke with patient's wife via phone this afternoon. Patient has endured a long hospitalization and was just extubated yesterday. CSW explained role and purpose of call. CSW explained that PT has recommended short term rehab for patient. Patient's wife initially stated she was taking him home when time with home health, but then allowed CSW to conduct a bedsearch and stated she would discuss with her children and follow up with me on Monday. She stated that the physicians have told her that patient would be in the hospital for 5-6 more weeks. CSW is unclear who might have told her this. CSW will follow up with patient's wife on Monday.  Employment status:    Insurance information:    PT Recommendations:  [place] / Referral to community resources:     Patient/Family's Response to care:  Patient's wife expressed appreciation for CSW assistance.  Patient/Family's  Understanding of and Emotional Response to Diagnosis, Current Treatment, and Prognosis:  Patient's wife is very hopeful for her husband.   Emotional Assessment Appearance:  Appears older than stated age Attitude/Demeanor/Rapport:  (sleeping ) Affect (typically observed):  Calm Orientation:  Oriented to Self Alcohol / Substance use:  Not Applicable Psych involvement (Current and /or in the community):  No (Comment)  Discharge Needs  Concerns to be addressed:  Care Coordination Readmission within the last 30 days:  No Current discharge risk:  None Barriers to Discharge:  No Barriers Identified   Shela Leff, LCSW 08/15/2018, 2:08 PM

## 2018-08-15 NOTE — Progress Notes (Signed)
[facility] NAME: Maurice Patterson    MR#:  [id number]  DATE OF BIRTH:  [birth year]  SUBJECTIVE:   Patient now extubated. No  familyin the room. Opens eyes to verbal command does not talk much. Wife says he whispers.  REVIEW OF SYSTEMS:    Review of Systems  Unable to perform ROS: Mental status change    Nutrition: Tube feeds PT recommends SNF  DRUG ALLERGIES:   Allergies  Allergen Reactions  . Hydrocodone Anaphylaxis  . Morphine Other (See Comments)    Loss of memory  . Ambien [Zolpidem] Other (See Comments)    delirium    . Brilinta [Ticagrelor] Other (See Comments)    Stroke   . Flexeril [Cyclobenzaprine] Other (See Comments)    delerium   . Flunitrazepam Other (See Comments)    ROHYPNOL (hallucinations)  . Haldol [Haloperidol Lactate] Other (See Comments)    delerium   . Levetiracetam Diarrhea and Other (See Comments)    Unable to walk  . Lorazepam Hives  . Risperdal [Risperidone] Other (See Comments)    Delirium   . Trazodone Other (See Comments)    Delirium Can take in low doses   . Benadryl [Diphenhydramine Hcl (Sleep)] Rash  . Penicillins Rash    Mouth ulcers Has patient had a PCN reaction causing immediate rash, facial/tongue/throat swelling, SOB or lightheadedness with hypotension: Yes Has patient had a PCN reaction causing severe rash involving mucus membranes or skin necrosis: No Has patient had a PCN reaction that required hospitalization No Has patient had a PCN reaction occurring within the last 10 years: Yes If all of the above answers are "NO", then may proceed with Cephalosporin use.    VITALS:  Blood pressure 129/72, pulse 80, temperature 97.7 F (36.5 C), temperature source Oral, resp. rate 20, height 5\' 6"  (1.676 m), weight 69.1 kg, SpO2 98 %.  PHYSICAL EXAMINATION:   Physical Exam  GENERAL:  62 y.o.-year-old patient lying in bed  diheveled EYES: Pupils equal, round, reactive  to light. No scleral icterus.  HEENT: Head atraumatic, normocephalic. ET and OG tube in place.   NECK:  Supple, no jugular venous distention. No thyroid enlargement, no tenderness.  LUNGS: Poor Resp. effort, no wheezing, rales, rhonchi. No use of accessory muscles of respiration.  CARDIOVASCULAR: S1, S2 normal. No murmurs, rubs, or gallops.  ABDOMEN: Soft, nontender, nondistended.few Bowel sounds present. No organomegaly or mass.  EXTREMITIES: No cyanosis, clubbing or edema b/l.    NEUROLOGIC: unable to examine. Patient still weak just got extubated PSYCHIATRIC: open's eyes to verbal commands. SKIN: No obvious rash, lesion, or ulcer.    LABORATORY PANEL:   CBC Recent Labs  Lab 08/14/18 0434  WBC 6.8  HGB 9.9*  HCT 31.8*  PLT 217   ------------------------------------------------------------------------------------------------------------------  Chemistries  Recent Labs  Lab 08/12/18 0431  08/14/18 0434 08/15/18 0435  NA 147*   < > 140 138  K 2.9*   < > 3.8 3.6  CL 113*   < > 104 106  CO2 30   < > 26 25  GLUCOSE 168*   < > 137* 209*  BUN 21   < > 15 14  CREATININE 0.85   < > 0.59* 0.68  CALCIUM 7.8*   < > 7.6* 7.9*  MG 2.3  --  2.1  --   AST 20  --   --   --   ALT 20  --   --   --  ALKPHOS 93  --   --   --   BILITOT 0.6  --   --   --    < > = values in this interval not displayed.   ------------------------------------------------------------------------------------------------------------------  Cardiac Enzymes No results for input(s): TROPONINI in the last 168 hours. ------------------------------------------------------------------------------------------------------------------  RADIOLOGY:  No results found.   ASSESSMENT AND PLAN:    62 year old male with past medical history significant for CAD, CHF, CKD, dementia, diabetes, hypertension and seizure disorder presents to hospital secondary to worsening weakness and poor appetite  1.  Acute respiratory  failure with hypoxia-patient was extubated on August 02, 2018 and reintubated on 07 August 2018.  Suspected to be secondary to aspiration pneumonia. -Extubated now - Continue IV cefepime, Flagyl for the aspiration pneumonia.   2.  Aspiration pneumonia-source of patient's worsening respiratory failure with hypoxemia. -Continue IV cefepime, Flagyl, follow cultures which are (-) so far.   3.  Altered mental status/encephalopathy-etiology unclear.  - CT head has been (-) for acute pathology. -Patient's LP showed no evidence of bacterial meningitis or herpes encephalitis. -Seen by infectious disease and neurology.  Etiology still remains unclear.  There was some concern for possible Wynelle [place] but that has been ruled out too.  - Patient to have repeat MRI/EEG.  - EEG was done showing generalized irregular slowing consistent with encephalopathy but no acute seizure type activity. -Continue empiric Lamictal, Vimpat as per neurology. - hold sedative meds.    4. Hypernatremia - resolved  5. Hypokalemia - repleted  5.  Dementia-continue Namenda, aricept.  6.  Essential hypertension-continue metoprolol.  7.  Diabetes type 2 without complication-continue Levemir, NovoLog every 4 hrs and SSI  8. GERD - cont. Protonix.    Pt's prognosis is quite poor  CSW for d/c planning  CODE STATUS: Full code  DVT Prophylaxis: Lovenox  TOTAL TIME TAKING CARE OF THIS PATIENT: 25 minutes.   POSSIBLE D/C unclear DEPENDING ON CLINICAL CONDITION. Discussed with wife in the room  Fritzi Mandes M.D on 08/15/2018 at 3:38 PM  Between 7am to 6pm - Pager - [phone]  After 6pm go to [email or web address] - Proofreader  [facility]  Office  [phone]  CC: Primary care physician; [name], MD

## 2018-08-15 NOTE — Progress Notes (Signed)
Wife denies patient ever having a seizure, states she does not want give patient lamictal at home. She also does not want him taking vimpat either. Wife also says he does not tolerate miralax or vimpat. Med list updated with wife at bedside.

## 2018-08-15 NOTE — Care Management (Signed)
Patient extubated 12/23 and on room air with sats in the 90's.  He seems lethargic. ABG did not show patient was hypercapnic.  Physical therapy has recommended skilled nursing facility placement. Patient requires much redirection during tasks and also seem to be selective of who he will respond to, how quickly he responds.  Seems to responds to family member quicker that he does staff.  Staff will try to feed patient using aspiration precautions.

## 2018-08-15 NOTE — NC FL2 (Addendum)
[place] LEVEL OF CARE SCREENING TOOL     IDENTIFICATION  Patient Name: Maurice Patterson Birthdate: 05-Feb-1956 Sex: male Admission Date (Current Location): 07/22/2018  [place] and [place] Number:  Engineering geologist and Address:  [facility], [address], [place], [place] [address]      Provider Number: [id number]  Attending Physician Name and Address:  Fritzi Mandes, MD  Relative Name and Phone Number:       Current Level of Care: Hospital Recommended Level of Care: [employer] Prior Approval Number:    Date Approved/Denied:   PASRR Number:    Discharge Plan: SNF    Current Diagnoses: Patient Active Problem List   Diagnosis Date Noted  . Pressure injury of skin 08/12/2018  . Aspiration pneumonia ([place])   . Palliative care encounter   . Dysphagia   . Acute respiratory failure ([place])   . Fever   . CAP (community acquired pneumonia) 07/22/2018  . GERD (gastroesophageal reflux disease) 07/22/2018  . Hypersalivation 01/30/2018  . Drooling 01/30/2018  . Alzheimer's dementia ([place]) 05/01/2017  . Cognitive decline 10/20/2016  . Chest pain, rule out acute myocardial infarction 04/05/2016  . Confusion 10/18/2015  . Constipation 08/23/2015  . Uncontrolled type 2 diabetes mellitus with hyperglycemia, with long-term current use of insulin ([place]) 08/23/2015  . Essential hypertension 08/23/2015  . Right lower lobe pneumonia ([place]) 08/23/2015  . Abdominal pain 08/21/2015  . Altered mental status 02/19/2015    Orientation RESPIRATION BLADDER Height & Weight     Self  2 liters O2   Weight: 152 lb 5.4 oz (69.1 kg) Height:  _0  (167.6 cm)  BEHAVIORAL SYMPTOMS/MOOD NEUROLOGICAL BOWEL NUTRITION STATUS  (none)     Diet, Feeding tube  AMBULATORY STATUS COMMUNICATION OF NEEDS Skin   Total Care   PU Stage and Appropriate Care                       Personal Care Assistance Level of Assistance  Total care            Functional Limitations Info             SPECIAL CARE FACTORS FREQUENCY  PT (By licensed PT)                    Contractures Contractures Info: Not present    Additional Factors Info  Code Status Code Status Info: full             Current Medications (08/15/2018):  This is the current hospital active medication list Current Facility-Administered Medications  Medication Dose Route Frequency Provider Last Rate Last Dose  . 0.9 %  sodium chloride infusion   Intravenous PRN Lance Coon, MD 10 mL/hr at 08/13/18 2049 10 mL/hr at 08/13/18 2049  . acetaminophen (TYLENOL) tablet 650 mg  650 mg Per Tube Q6H PRN Flora Lipps, MD      . albuterol (PROVENTIL) (2.5 MG/3ML) 0.083% nebulizer solution 2.5 mg  2.5 mg Nebulization Q2H PRN Gouru, Aruna, MD      . aspirin chewable tablet 81 mg  81 mg Per Tube Daily Hallaji, Sheema M, RPH   81 mg at 08/15/18 0813  . budesonide (PULMICORT) nebulizer solution 0.25 mg  0.25 mg Nebulization BID Wilhelmina Mcardle, MD   0.25 mg at 08/15/18 0818  . ceFEPIme (MAXIPIME) 1 g in sodium chloride 0.9 % 100 mL IVPB  1 g Intravenous Q12H Flora Lipps, MD  200 mL/hr at 08/15/18 0935 1 g at 08/15/18 0935  . chlorhexidine gluconate (MEDLINE KIT) (PERIDEX) 0.12 % solution 15 mL  15 mL Mouth Rinse BID Conforti, John, DO   15 mL at 08/15/18 0813  . donepezil (ARICEPT) tablet 10 mg  10 mg Per Tube BID Charlett Nose, RPH   10 mg at 08/15/18 0815  . enoxaparin (LOVENOX) injection 40 mg  40 mg Subcutaneous Q24H Gladstone Lighter, MD   40 mg at 08/15/18 0814  . feeding supplement (OSMOLITE 1.5 CAL) liquid 1,000 mL  1,000 mL Per Tube Continuous Wilhelmina Mcardle, MD 55 mL/hr at 08/15/18 1203 1,000 mL at 08/15/18 1203  . [START ON 08/16/2018] feeding supplement (PRO-STAT SUGAR FREE 64) liquid 30 mL  30 mL Per Tube Daily Wilhelmina Mcardle, MD      . free water 100 mL  100 mL Per Tube Q8H Wilhelmina Mcardle, MD      . insulin aspart (novoLOG) injection 0-15 Units  0-15  Units Subcutaneous Q4H Tyler Pita, MD   5 Units at 08/15/18 1204  . ipratropium-albuterol (DUONEB) 0.5-2.5 (3) MG/3ML nebulizer solution 3 mL  3 mL Nebulization Q6H Wilhelmina Mcardle, MD   3 mL at 08/15/18 1400  . lacosamide (VIMPAT) tablet 200 mg  200 mg Per Tube BID Wilhelmina Mcardle, MD      . lamoTRIgine (LAMICTAL) tablet 100 mg  100 mg Per Tube BID Pernell Dupre, RPH   100 mg at 08/15/18 7544  . memantine (NAMENDA) tablet 10 mg  10 mg Per Tube BID Charlett Nose, RPH   10 mg at 08/15/18 0816  . metoprolol tartrate (LOPRESSOR) tablet 25 mg  25 mg Per Tube BID Pernell Dupre, RPH   25 mg at 08/15/18 9201  . metroNIDAZOLE (FLAGYL) IVPB 500 mg  500 mg Intravenous Q8H Flora Lipps, MD 100 mL/hr at 08/15/18 1300    . modafinil (PROVIGIL) tablet 200 mg  200 mg Per Tube Daily Charlett Nose, RPH   200 mg at 08/15/18 0818  . ondansetron (ZOFRAN) injection 4 mg  4 mg Intravenous Q6H PRN Lance Coon, MD      . polyethylene glycol (MIRALAX / GLYCOLAX) packet 17 g  17 g Per Tube Daily Charlett Nose, RPH   17 g at 08/14/18 0840  . [START ON 08/16/2018] senna-docusate (Senokot-S) tablet 1 tablet  1 tablet Per Tube Daily Charlett Nose, RPH      . sodium chloride flush (NS) 0.9 % injection 10-40 mL  10-40 mL Intracatheter Q12H Awilda Bill, NP   10 mL at 08/15/18 0829  . sodium chloride flush (NS) 0.9 % injection 10-40 mL  10-40 mL Intracatheter PRN Awilda Bill, NP      . thiamine (VITAMIN B-1) tablet 100 mg  100 mg Per Tube Daily Flora Lipps, MD   100 mg at 08/15/18 0071     Discharge Medications: Please see discharge summary for a list of discharge medications.  Relevant Imaging Results:  Relevant Lab Results:   Additional Information ss: [id number]  Shela Leff, LCSW

## 2018-08-15 NOTE — Progress Notes (Signed)
Pharmacy Monitoring Consult:  Pharmacy consulted to assist in monitoring and replacing electrolytes in this 62 y.o. male admitted on 07/22/2018 with CAP.   Patient extubated on 12/23.   Labs:  Sodium (mmol/L)  Date Value  08/15/2018 138  10/20/2016 146 (H)  12/17/2014 138   Potassium (mmol/L)  Date Value  08/15/2018 3.6  12/17/2014 3.7   Magnesium (mg/dL)  Date Value  08/14/2018 2.1  12/17/2014 1.8   Phosphorus (mg/dL)  Date Value  08/14/2018 3.2   Calcium (mg/dL)  Date Value  08/15/2018 7.9 (L)   Calcium, Total (mg/dL)  Date Value  12/17/2014 7.9 (L)   Albumin (g/dL)  Date Value  08/12/2018 2.4 (L)  10/20/2016 4.5  12/17/2014 3.2 (L)    Assessment/Plan: 1. Electrolytes:  No supplementation warranted.   2. Constipation: Last bowel movement 12/24. Will transition to senna/docusate 1 tab daily.   3. Glucose: Tube feeds being held post extubation. Patient ordered D5LR at 13mL/hr. Patient with SSI orders.  Will sign off. Please reconsult if further pharmacy interventions are desired.   Yaritza Leist L 08/15/2018 1:28 PM

## 2018-08-16 LAB — GLUCOSE, CAPILLARY
Glucose-Capillary: 191 mg/dL — ABNORMAL HIGH (ref 70–99)
Glucose-Capillary: 204 mg/dL — ABNORMAL HIGH (ref 70–99)
Glucose-Capillary: 241 mg/dL — ABNORMAL HIGH (ref 70–99)
Glucose-Capillary: 245 mg/dL — ABNORMAL HIGH (ref 70–99)
Glucose-Capillary: 255 mg/dL — ABNORMAL HIGH (ref 70–99)
Glucose-Capillary: 264 mg/dL — ABNORMAL HIGH (ref 70–99)
Glucose-Capillary: 309 mg/dL — ABNORMAL HIGH (ref 70–99)

## 2018-08-16 MED ORDER — CHLORHEXIDINE GLUCONATE 0.12 % MT SOLN
15.0000 mL | Freq: Two times a day (BID) | OROMUCOSAL | Status: DC
  Administered 2018-08-16 – 2018-09-08 (×45): 15 mL via OROMUCOSAL
  Filled 2018-08-16 (×41): qty 15

## 2018-08-16 NOTE — Plan of Care (Signed)

## 2018-08-16 NOTE — Progress Notes (Signed)
Patients wife states she has "never seen" the patient have a seizure.

## 2018-08-16 NOTE — Progress Notes (Signed)
[name] at [place] NAME: Maurice Patterson    MR#:  [id number]  DATE OF BIRTH:  04/07/1956  SUBJECTIVE:    No Family in the room. Opens eyes to verbal command does not talk much.   REVIEW OF SYSTEMS:    Review of Systems  Unable to perform ROS: Mental status change    Nutrition: Tube feeds PT recommends SNF  DRUG ALLERGIES:   Allergies  Allergen Reactions  . Hydrocodone Anaphylaxis  . Morphine Other (See Comments)    Loss of memory  . Ambien [Zolpidem] Other (See Comments)    delirium    . Brilinta [Ticagrelor] Other (See Comments)    Stroke   . Flexeril [Cyclobenzaprine] Other (See Comments)    delerium   . Flunitrazepam Other (See Comments)    ROHYPNOL (hallucinations)  . Haldol [Haloperidol Lactate] Other (See Comments)    delerium   . Levetiracetam Diarrhea and Other (See Comments)    Unable to walk  . Lorazepam Hives  . Risperdal [Risperidone] Other (See Comments)    Delirium   . Trazodone Other (See Comments)    Delirium Can take in low doses   . Benadryl [Diphenhydramine Hcl (Sleep)] Rash  . Penicillins Rash    Mouth ulcers Has patient had a PCN reaction causing immediate rash, facial/tongue/throat swelling, SOB or lightheadedness with hypotension: Yes Has patient had a PCN reaction causing severe rash involving mucus membranes or skin necrosis: No Has patient had a PCN reaction that required hospitalization No Has patient had a PCN reaction occurring within the last 10 years: Yes If all of the above answers are "NO", then may proceed with Cephalosporin use.    VITALS:  Blood pressure 123/69, pulse 82, temperature 98.6 F (37 C), temperature source Oral, resp. rate (!) 25, height 5\' 6"  (1.676 m), weight 70.2 kg, SpO2 94 %.  PHYSICAL EXAMINATION:   Physical Exam  GENERAL:  62 y.o.-year-old patient lying in bed  disheveled EYES: Pupils equal, round, reactive to light. No scleral icterus.  HEENT:  Head atraumatic, normocephalic. ET and OG tube in place.   NECK:  Supple, no jugular venous distention. No thyroid enlargement, no tenderness.  LUNGS: Poor Resp. effort, no wheezing, rales, rhonchi. No use of accessory muscles of respiration.  CARDIOVASCULAR: S1, S2 normal. No murmurs, rubs, or gallops.  ABDOMEN: Soft, nontender, nondistended.few Bowel sounds present. No organomegaly or mass.  EXTREMITIES: No cyanosis, clubbing or edema b/l.    NEUROLOGIC: unable to examine. Patient still weak just got extubated PSYCHIATRIC: open's eyes to verbal commands. SKIN: No obvious rash, lesion, or ulcer.    LABORATORY PANEL:   CBC Recent Labs  Lab 08/14/18 0434  WBC 6.8  HGB 9.9*  HCT 31.8*  PLT 217   ------------------------------------------------------------------------------------------------------------------  Chemistries  Recent Labs  Lab 08/12/18 0431  08/14/18 0434 08/15/18 0435  NA 147*   < > 140 138  K 2.9*   < > 3.8 3.6  CL 113*   < > 104 106  CO2 30   < > 26 25  GLUCOSE 168*   < > 137* 209*  BUN 21   < > 15 14  CREATININE 0.85   < > 0.59* 0.68  CALCIUM 7.8*   < > 7.6* 7.9*  MG 2.3  --  2.1  --   AST 20  --   --   --   ALT 20  --   --   --  ALKPHOS 93  --   --   --   BILITOT 0.6  --   --   --    < > = values in this interval not displayed.   ------------------------------------------------------------------------------------------------------------------  Cardiac Enzymes No results for input(s): TROPONINI in the last 168 hours. ------------------------------------------------------------------------------------------------------------------  RADIOLOGY:  No results found.   ASSESSMENT AND PLAN:    62 year old male with past medical history significant for CAD, CHF, CKD, dementia, diabetes, hypertension and seizure disorder presents to hospital secondary to worsening weakness and poor appetite  1.  Acute respiratory failure with hypoxia-patient was  extubated on August 02, 2018 and reintubated on 07 August 2018.  Suspected to be secondary to aspiration pneumonia. -Extubated now - Continue IV cefepime, Flagyl for the aspiration pneumonia.   2.  Aspiration pneumonia-source of patient's worsening respiratory failure with hypoxemia. -Continue IV cefepime, Flagyl, follow cultures which are (-) so far.   3.  Altered mental status/encephalopathy-etiology unclear.  - CT head has been (-) for acute pathology. -Patient's LP showed no evidence of bacterial meningitis or herpes encephalitis. -Seen by infectious disease and neurology.  Etiology still remains unclear.  There was some concern for possible Wynelle Cleveland but that has been ruled out too.  - Patient to have repeat MRI/EEG.  - EEG was done showing generalized irregular slowing consistent with encephalopathy but no acute seizure type activity. -Continue empiric Lamictal, Vimpat as per neurology. - hold sedative meds.    4. Hypernatremia - resolved  5. Hypokalemia - repleted  5.  Dementia-continue Namenda, aricept.  6.  Essential hypertension-continue metoprolol.  7.  Diabetes type 2 without complication-continue Levemir, NovoLog every 4 hrs and SSI  8. GERD - cont. Protonix.    Pt's prognosis is quite poor  CSW for d/c planning  CODE STATUS: Full code  DVT Prophylaxis: Lovenox  TOTAL TIME TAKING CARE OF THIS PATIENT: 25 minutes.   POSSIBLE D/C unclear DEPENDING ON CLINICAL CONDITION. Discussed with wife in the room  Fritzi Mandes M.D on 08/16/2018 at 1:06 PM  Between 7am to 6pm - Pager - [phone]  After 6pm go to [email or web address] - Proofreader  [facility]  Office  [phone]  CC: Primary care physician; [name], MD

## 2018-08-16 NOTE — Progress Notes (Signed)
Patients family at bedside and patient interacting and trying to talk with them. Patient alert and smiling.

## 2018-08-16 NOTE — Progress Notes (Signed)
More responsive and interactive. No respiratory distress.   Vitals:   08/16/18 1000 08/16/18 1200 08/16/18 1400 08/16/18 1600  BP: (!) 142/68 123/69  123/74  Pulse: 93 82  81  Resp: 19 (!) 25  19  Temp:   98.2 F (36.8 C)   TempSrc:   Oral   SpO2: 96% 94%  98%  Weight:      Height:      2 LPM [place]  Gen: NAD HEENT: NCAT, sclerae white Neck: No JVD Lungs: scattered rhonchi Cardiovascular: RRR, no murmurs Abdomen: Soft, nontender, normal BS Ext: without clubbing, cyanosis, edema Neuro: grossly intact Skin: Limited exam, no lesions noted    BMP Latest Ref Rng & Units 08/15/2018 08/14/2018 08/13/2018  Glucose 70 - 99 mg/dL 209(H) 137(H) 162(H)  BUN 8 - 23 mg/dL 14 15 18   Creatinine 0.61 - 1.24 mg/dL 0.68 0.59(L) 0.67  BUN/Creat Ratio 9 - 20 - - -  Sodium 135 - 145 mmol/L 138 140 144  Potassium 3.5 - 5.1 mmol/L 3.6 3.8 3.7  Chloride 98 - 111 mmol/L 106 104 111  CO2 22 - 32 mmol/L 25 26 27   Calcium 8.9 - 10.3 mg/dL 7.9(L) 7.6(L) 7.7(L)    CBC Latest Ref Rng & Units 08/14/2018 08/12/2018 08/10/2018  WBC 4.0 - 10.5 K/uL 6.8 7.1 13.6(H)  Hemoglobin 13.0 - 17.0 g/dL 9.9(L) 9.8(L) 10.7(L)  Hematocrit 39.0 - 52.0 % 31.8(L) 31.8(L) 35.0(L)  Platelets 150 - 400 K/uL 217 216 250   CXR: No new film  IMPRESSION: Acute respiratory failure with hypoxemia and hypercarbia, resolving Prolonged ventilator dependence.  Appears to be resolved Aspiration pneumonia, appears to be fully treated Underlying dementia History of HSV encephalitis Acute/chronic encephalopathy ICU acquired deconditioning  PLAN/REC: Cont SDU status Continue supplemental oxygen as needed Continue NTS as needed Will leave NGT in place to provide meds and nutrition Advance activity as able.  PT involved DC Lamictal and Vimpat per wife's request - she thinks this is cause of his encephalopathy   Merton Border, MD PCCM service Mobile [phone] Pager [phone] 08/16/2018 5:45 PM

## 2018-08-17 ENCOUNTER — Inpatient Hospital Stay: Payer: PPO

## 2018-08-17 DIAGNOSIS — E1165 Type 2 diabetes mellitus with hyperglycemia: Secondary | ICD-10-CM

## 2018-08-17 LAB — GLUCOSE, CAPILLARY
Glucose-Capillary: 309 mg/dL — ABNORMAL HIGH (ref 70–99)
Glucose-Capillary: 289 mg/dL — ABNORMAL HIGH (ref 70–99)
Glucose-Capillary: 310 mg/dL — ABNORMAL HIGH (ref 70–99)
Glucose-Capillary: 178 mg/dL — ABNORMAL HIGH (ref 70–99)
Glucose-Capillary: 220 mg/dL — ABNORMAL HIGH (ref 70–99)
Glucose-Capillary: 256 mg/dL — ABNORMAL HIGH (ref 70–99)

## 2018-08-17 MED ORDER — ALBUTEROL SULFATE (2.5 MG/3ML) 0.083% IN NEBU
2.5000 mg | INHALATION_SOLUTION | RESPIRATORY_TRACT | Status: DC | PRN
  Administered 2018-08-28: 2.5 mg via RESPIRATORY_TRACT
  Filled 2018-08-17: qty 3

## 2018-08-17 MED ORDER — INSULIN GLARGINE 100 UNIT/ML ~~LOC~~ SOLN
15.0000 [IU] | Freq: Every day | SUBCUTANEOUS | Status: DC
  Administered 2018-08-17 – 2018-08-21 (×5): 15 [IU] via SUBCUTANEOUS
  Filled 2018-08-17 (×5): qty 0.15

## 2018-08-17 NOTE — Progress Notes (Addendum)
More responsive and interactive. No respiratory distress.   Vitals:   08/17/18 0500 08/17/18 0600 08/17/18 0806 08/17/18 0817  BP: 124/74 133/81    Pulse: 85 85    Resp: 16 (!) 25    Temp:    98 F (36.7 C)  TempSrc:    Oral  SpO2: 97% 97% 96%   Weight:      Height:      RA  Gen: Slightly more alert appearing.  Follows some commands.  NAD HEENT: NCAT, sclerae white Neck: No JVD Lungs: Clear anteriorly Cardiovascular: RRR, no murmurs Abdomen: Soft, nontender, normal BS Ext: without clubbing, cyanosis, edema Neuro: Cognition remains poor (?  Baseline), cranial nerves intact, moves all extremities Skin: Limited exam, no lesions noted    BMP Latest Ref Rng & Units 08/15/2018 08/14/2018 08/13/2018  Glucose 70 - 99 mg/dL 209(H) 137(H) 162(H)  BUN 8 - 23 mg/dL 14 15 18   Creatinine 0.61 - 1.24 mg/dL 0.68 0.59(L) 0.67  BUN/Creat Ratio 9 - 20 - - -  Sodium 135 - 145 mmol/L 138 140 144  Potassium 3.5 - 5.1 mmol/L 3.6 3.8 3.7  Chloride 98 - 111 mmol/L 106 104 111  CO2 22 - 32 mmol/L 25 26 27   Calcium 8.9 - 10.3 mg/dL 7.9(L) 7.6(L) 7.7(L)    CBC Latest Ref Rng & Units 08/14/2018 08/12/2018 08/10/2018  WBC 4.0 - 10.5 K/uL 6.8 7.1 13.6(H)  Hemoglobin 13.0 - 17.0 g/dL 9.9(L) 9.8(L) 10.7(L)  Hematocrit 39.0 - 52.0 % 31.8(L) 31.8(L) 35.0(L)  Platelets 150 - 400 K/uL 217 216 250   CXR: No new film  IMPRESSION: Acute respiratory failure with hypoxemia and hypercarbia, resolved Prolonged ventilator dependence.  Resolved Aspiration pneumonia, fully treated Underlying dementia/chronic encephalopathy History of HSV encephalitis Acute/chronic encephalopathy ICU acquired deconditioning Type 2 diabetes with hyperglycemia Dysphagia  PLAN/REC: Transfer to MedSurg floor with cardiac monitoring Continue supplemental oxygen as needed Continue NTS as needed Will leave NGT in place to provide meds and nutrition Advance activity as able.  PT involved Lantus initiated 12/26 Continue  [facility] Palliative care involved. Discussed with Quinn Axe, ANP After transfer, PCCM will sign off. Please call if we can be of further assistance  Merton Border, MD PCCM service Mobile [phone] Pager [phone] 08/17/2018 1:09 PM

## 2018-08-17 NOTE — Progress Notes (Signed)
[facility] at [place] NAME: Tai Skelly    MR#:  [id number]  DATE OF BIRTH:  1956/02/28  SUBJECTIVE:    No Family in the room. Opens eyes to verbal command does not talk much. According to the nurse patient pulled his NG tube halfway out now we insert that  REVIEW OF SYSTEMS:    Review of Systems  Unable to perform ROS: Mental status change    Nutrition: Tube feeds PT recommends SNF  DRUG ALLERGIES:   Allergies  Allergen Reactions  . Hydrocodone Anaphylaxis  . Morphine Other (See Comments)    Loss of memory  . Ambien [Zolpidem] Other (See Comments)    delirium    . Brilinta [Ticagrelor] Other (See Comments)    Stroke   . Flexeril [Cyclobenzaprine] Other (See Comments)    delerium   . Flunitrazepam Other (See Comments)    ROHYPNOL (hallucinations)  . Haldol [Haloperidol Lactate] Other (See Comments)    delerium   . Levetiracetam Diarrhea and Other (See Comments)    Unable to walk  . Lorazepam Hives  . Risperdal [Risperidone] Other (See Comments)    Delirium   . Trazodone Other (See Comments)    Delirium Can take in low doses   . Benadryl [Diphenhydramine Hcl (Sleep)] Rash  . Penicillins Rash    Mouth ulcers Has patient had a PCN reaction causing immediate rash, facial/tongue/throat swelling, SOB or lightheadedness with hypotension: Yes Has patient had a PCN reaction causing severe rash involving mucus membranes or skin necrosis: No Has patient had a PCN reaction that required hospitalization No Has patient had a PCN reaction occurring within the last 10 years: Yes If all of the above answers are "NO", then may proceed with Cephalosporin use.    VITALS:  Blood pressure 122/77, pulse 89, temperature 98 F (36.7 C), temperature source Oral, resp. rate 16, height 5\' 6"  (1.676 m), weight 64.2 kg, SpO2 98 %.  PHYSICAL EXAMINATION:   Physical Exam  GENERAL:  62 y.o.-year-old patient lying in bed   disheveled EYES: Pupils equal, round, reactive to light. No scleral icterus.  HEENT: Head atraumatic, normocephalic. ET and OG tube in place.   NECK:  Supple, no jugular venous distention. No thyroid enlargement, no tenderness.  LUNGS: Poor Resp. effort, no wheezing, rales, rhonchi. No use of accessory muscles of respiration.  CARDIOVASCULAR: S1, S2 normal. No murmurs, rubs, or gallops.  ABDOMEN: Soft, nontender, nondistended.few Bowel sounds present. No organomegaly or mass.  EXTREMITIES: No cyanosis, clubbing or edema b/l.    NEUROLOGIC: unable to examine. Patient still weak just got extubated PSYCHIATRIC: open's eyes to verbal commands. SKIN: No obvious rash, lesion, or ulcer.    LABORATORY PANEL:   CBC Recent Labs  Lab 08/14/18 0434  WBC 6.8  HGB 9.9*  HCT 31.8*  PLT 217   ------------------------------------------------------------------------------------------------------------------  Chemistries  Recent Labs  Lab 08/12/18 0431  08/14/18 0434 08/15/18 0435  NA 147*   < > 140 138  K 2.9*   < > 3.8 3.6  CL 113*   < > 104 106  CO2 30   < > 26 25  GLUCOSE 168*   < > 137* 209*  BUN 21   < > 15 14  CREATININE 0.85   < > 0.59* 0.68  CALCIUM 7.8*   < > 7.6* 7.9*  MG 2.3  --  2.1  --   AST 20  --   --   --  ALT 20  --   --   --   ALKPHOS 93  --   --   --   BILITOT 0.6  --   --   --    < > = values in this interval not displayed.   ------------------------------------------------------------------------------------------------------------------  Cardiac Enzymes No results for input(s): TROPONINI in the last 168 hours. ------------------------------------------------------------------------------------------------------------------  RADIOLOGY:  Dg Abd 1 View  Result Date: 08/17/2018 CLINICAL DATA:  Feeding tube placement EXAM: ABDOMEN - 1 VIEW COMPARISON:  None. FINDINGS: Feeding tube tip is in the stomach. There is no bowel dilatation or air-fluid level to  suggest bowel obstruction. No free air. There is mild atelectatic change in the medial left base. IMPRESSION: Feeding tube tip in stomach. No bowel obstruction or free air evident. Electronically Signed   By: Lowella Grip III M.D.   On: 08/17/2018 13:42     ASSESSMENT AND PLAN:    62 year old male with past medical history significant for CAD, CHF, CKD, dementia, diabetes, hypertension and seizure disorder presents to hospital secondary to worsening weakness and poor appetite  1.  Acute respiratory failure with hypoxia-patient was extubated on August 02, 2018 and reintubated on 07 August 2018.  Suspected to be secondary to aspiration pneumonia. -Extubated now - Completed IV cefepime, Flagyl for the aspiration pneumonia.   2.  Aspiration pneumonia-source of patient's worsening respiratory failure with hypoxemia. -completed IV cefepime, Flagyl, follow cultures which are (-) so far.   3.  Altered mental status/encephalopathy-etiology unclear.  - CT head has been (-) for acute pathology. -Patient's LP showed no evidence of bacterial meningitis or herpes encephalitis. -Seen by infectious disease and neurology.  Etiology still remains unclear.  There was some concern for possible Wynelle Cleveland but that has been ruled out too.  - Patient to have repeat MRI/EEG.  - EEG was done showing generalized irregular slowing consistent with encephalopathy but no acute seizure type activity. -Continue empiric Lamictal, Vimpat as per neurology. - hold sedative meds.    4. Hypernatremia - resolved  5. Hypokalemia - repleted  5.  Dementia-continue Namenda, aricept.  6.  Essential hypertension-continue metoprolol.  7.  Diabetes type 2 without complication-continue Levemir, NovoLog every 4 hrs and SSI  8. GERD - cont. Protonix.    Pt's prognosis is quite poor  CSW for d/c planning-- to rehab  CODE STATUS: Full code  DVT Prophylaxis: Lovenox  TOTAL TIME TAKING CARE OF THIS PATIENT:  25 minutes.   POSSIBLE D/C unclear DEPENDING ON CLINICAL CONDITION. Discussed with wife in the room  Fritzi Mandes M.D on 08/17/2018 at 3:03 PM  Between 7am to 6pm - Pager - [phone]  After 6pm go to [email or web address] - Proofreader  [facility]  Office  [phone]  CC: Primary care physician; [name], MD

## 2018-08-17 NOTE — Progress Notes (Signed)
Physical Therapy Treatment Patient Details Name: Maurice Patterson MRN: [id number] DOB: 05-31-1956 Today's Date: 08/17/2018    History of Present Illness 62 y/o male here with pneumonia and respiratory distress has intubated (on and off) and ultimately extubated 12/23.      PT Comments    Pt asleep upon arrival this am.  Pt would open eyes very briefly then fall quickly back to sleep.  Supine LE exercises performed with little to no participation by pt.  Pt did not awaken further despite verbal and tactile cues.  Mobility skills not appropriate at this time.  Will continue as appropriate.   Follow Up Recommendations  SNF     Equipment Recommendations       Recommendations for Other Services       Precautions / Restrictions Precautions Precautions: Fall Restrictions Weight Bearing Restrictions: No    Mobility  Bed Mobility               General bed mobility comments: pt unable to wake enough to make this a reasonable option  Transfers                 General transfer comment: all mobility deferred secondary to mental status  Ambulation/Gait                 Stairs             Wheelchair Mobility    Modified Rankin (Stroke Patients Only)       Balance                                            Cognition Arousal/Alertness: Lethargic   Overall Cognitive Status: Impaired/Different from baseline                                 General Comments: briefly opens eyes but falls quickly back to sleep.      Exercises Other Exercises Other Exercises: supine PROM BLE for ankle pumps, heel slides, ab/add and SLR x 10     General Comments        Pertinent Vitals/Pain Pain Assessment: No/denies pain    Home Living                      Prior Function            PT Goals (current goals can now be found in the care plan section) Progress towards PT goals: Not progressing toward goals -  comment    Frequency    Min 2X/week      PT Plan Current plan remains appropriate    Co-evaluation              AM-PAC PT "6 Clicks" Mobility   Outcome Measure  Help needed turning from your back to your side while in a flat bed without using bedrails?: Total Help needed moving from lying on your back to sitting on the side of a flat bed without using bedrails?: Total Help needed moving to and from a bed to a chair (including a wheelchair)?: Total Help needed standing up from a chair using your arms (e.g., wheelchair or bedside chair)?: Total Help needed to walk in hospital room?: Total Help needed climbing 3-5 steps with a railing? : Total 6 Click Score: 6  End of Session   Activity Tolerance: Patient limited by lethargy Patient left: with call bell/phone within reach;with bed alarm set;in bed         Time: 1975-8832 PT Time Calculation (min) (ACUTE ONLY): 8 min  Charges:  $Gait Training: 8-22 mins                     Chesley Noon, PTA 08/17/18, 12:04 PM

## 2018-08-17 NOTE — Progress Notes (Signed)
Pt pulled NGT half way out. NGT repositioned to 80 cm as previously measured and reinforced with new tape. KUB ordered to confirm placement and mittens placed on pt. Will continue to monitor.

## 2018-08-18 LAB — GLUCOSE, CAPILLARY
GLUCOSE-CAPILLARY: 254 mg/dL — AB (ref 70–99)
GLUCOSE-CAPILLARY: 129 mg/dL — AB (ref 70–99)
Glucose-Capillary: 170 mg/dL — ABNORMAL HIGH (ref 70–99)
Glucose-Capillary: 190 mg/dL — ABNORMAL HIGH (ref 70–99)
Glucose-Capillary: 205 mg/dL — ABNORMAL HIGH (ref 70–99)
Glucose-Capillary: 206 mg/dL — ABNORMAL HIGH (ref 70–99)
Glucose-Capillary: 228 mg/dL — ABNORMAL HIGH (ref 70–99)

## 2018-08-18 NOTE — Progress Notes (Signed)
[facility] NAME: Arie Powell    MR#:  [id number]  DATE OF BIRTH:  [birth year]  SUBJECTIVE:  CHIEF COMPLAINT:  No chief complaint on file.  -Patient remains extubated.  Opening eyes to his name and following some simple commands.  Not very interactive otherwise  REVIEW OF SYSTEMS:  Review of Systems  Unable to perform ROS: Medical condition    DRUG ALLERGIES:   Allergies  Allergen Reactions  . Hydrocodone Anaphylaxis  . Morphine Other (See Comments)    Loss of memory  . Ambien [Zolpidem] Other (See Comments)    delirium    . Brilinta [Ticagrelor] Other (See Comments)    Stroke   . Flexeril [Cyclobenzaprine] Other (See Comments)    delerium   . Flunitrazepam Other (See Comments)    ROHYPNOL (hallucinations)  . Haldol [Haloperidol Lactate] Other (See Comments)    delerium   . Levetiracetam Diarrhea and Other (See Comments)    Unable to walk  . Lorazepam Hives  . Risperdal [Risperidone] Other (See Comments)    Delirium   . Trazodone Other (See Comments)    Delirium Can take in low doses   . Benadryl [Diphenhydramine Hcl (Sleep)] Rash  . Penicillins Rash    Mouth ulcers Has patient had a PCN reaction causing immediate rash, facial/tongue/throat swelling, SOB or lightheadedness with hypotension: Yes Has patient had a PCN reaction causing severe rash involving mucus membranes or skin necrosis: No Has patient had a PCN reaction that required hospitalization No Has patient had a PCN reaction occurring within the last 10 years: Yes If all of the above answers are "NO", then may proceed with Cephalosporin use.    VITALS:  Blood pressure 137/74, pulse 83, temperature (!) 96.1 F (35.6 C), temperature source Axillary, resp. rate 17, height 5\' 6"  (1.676 m), weight 62.2 kg, SpO2 96 %.  PHYSICAL EXAMINATION:  Physical Exam   GENERAL:  62 y.o.-year-old patient lying in the bed with no acute distress.  EYES:  Pupils equal, round, reactive to light and accommodation. No scleral icterus. Extraocular muscles intact.  HEENT: Head atraumatic, normocephalic. Oropharynx and nasopharynx clear.  NECK:  Supple, no jugular venous distention. No thyroid enlargement, no tenderness.  LUNGS: Normal breath sounds bilaterally, no wheezing, rales,rhonchi or crepitation. No use of accessory muscles of respiration.  Decreased bibasilar breath sounds CARDIOVASCULAR: S1, S2 normal. No murmurs, rubs, or gallops.  ABDOMEN: Soft, nontender, nondistended. Bowel sounds present. No organomegaly or mass.  EXTREMITIES: No pedal edema, cyanosis, or clubbing.  NEUROLOGIC: Cranial nerves II through XII are intact.  No facial droop noted.  Both hands in mittens.  Able to follow some simple commands.  Does not respond by speaking.  Nodding head occasionally.   Sensation intact. Gait not checked.  PSYCHIATRIC: The patient is alert and following some simple commands SKIN: No obvious rash, lesion, or ulcer.    LABORATORY PANEL:   CBC Recent Labs  Lab 08/14/18 0434  WBC 6.8  HGB 9.9*  HCT 31.8*  PLT 217   ------------------------------------------------------------------------------------------------------------------  Chemistries  Recent Labs  Lab 08/12/18 0431  08/14/18 0434 08/15/18 0435  NA 147*   < > 140 138  K 2.9*   < > 3.8 3.6  CL 113*   < > 104 106  CO2 30   < > 26 25  GLUCOSE 168*   < > 137* 209*  BUN 21   < > 15 14  CREATININE 0.85   < >  0.59* 0.68  CALCIUM 7.8*   < > 7.6* 7.9*  MG 2.3  --  2.1  --   AST 20  --   --   --   ALT 20  --   --   --   ALKPHOS 93  --   --   --   BILITOT 0.6  --   --   --    < > = values in this interval not displayed.   ------------------------------------------------------------------------------------------------------------------  Cardiac Enzymes No results for input(s): TROPONINI in the last 168  hours. ------------------------------------------------------------------------------------------------------------------  RADIOLOGY:  Dg Abd 1 View  Result Date: 08/17/2018 CLINICAL DATA:  Feeding tube placement EXAM: ABDOMEN - 1 VIEW COMPARISON:  None. FINDINGS: Feeding tube tip is in the stomach. There is no bowel dilatation or air-fluid level to suggest bowel obstruction. No free air. There is mild atelectatic change in the medial left base. IMPRESSION: Feeding tube tip in stomach. No bowel obstruction or free air evident. Electronically Signed   By: Lowella Grip III M.D.   On: 08/17/2018 13:42   Dg Chest Port 1 View  Result Date: 08/17/2018 CLINICAL DATA:  Acute respiratory failure EXAM: PORTABLE CHEST 1 VIEW COMPARISON:  08/12/2018 FINDINGS: Cardiac shadow is stable. Feeding catheter is noted coiled within the stomach. The endotracheal tube has been removed in the interval. The lungs are well aerated bilaterally. Some linear scarring is noted in the left base. No acute infiltrate is seen. IMPRESSION: No acute abnormality noted Electronically Signed   By: Inez Catalina M.D.   On: 08/17/2018 23:21    EKG:   Orders placed or performed during the hospital encounter of 07/22/18  . EKG 12-Lead  . EKG 12-Lead  . EKG 12-Lead  . EKG 12-Lead  . ED EKG  . ED EKG    ASSESSMENT AND PLAN:   62 year old male with past medical history significant for CAD, CHF, CKD, dementia, diabetes, hypertension and seizure disorder presents to hospital secondary to worsening weakness and poor appetite.  1.  Acute encephalopathy-unclear etiology at this time. -Patient had MRI with and without contrast on admission that did not show anything acute, initial EEG concern for generalized slowing. -Metabolic encephalopathy.  Lumbar puncture also was done and negative for any Gram stain organisms. -Patient was treated empirically for possible herpes encephalitis -Mentation is slightly improved that patient is  alert and following some simple commands.  Still not close to baseline according to wife. -Appreciate neurology consult.  Vimpat and Lamictal discontinued as concern for mental status changes on those new medications according to wife. -On Provigil during daytime  2.  Acute hypoxic respiratory failure-patient admitted to the hospital on 07/23/2018, intubated on 07/29/2018 initially, were reintubated again on 08/07/2018 after self extubation. -Aspiration pneumonia and suspected.  Currently extubated.  Finished IV antibiotics -Speech therapy consult.  3.  Hypokalemia and hyponatremia.  Improved after replacements  4.  Dementia-continue Namenda, aricept.  But independent and interactive at baseline according to wife  5.  Diabetes mellitus-on Lantus and sliding scale insulin  6.    DVT prophylaxis-on Lovenox  7.  Nutrition-still has a Dobbhoff tube and tube feeds  Physical therapy consulted.  Likely will need placement   All the records are reviewed and case discussed with Care Management/Social Workerr. Management plans discussed with the patient, family and they are in agreement.  CODE STATUS: Full code  TOTAL TIME TAKING CARE OF THIS PATIENT: 38 minutes.   POSSIBLE D/C IN 2-3 DAYS, DEPENDING ON CLINICAL  CONDITION.   Gladstone Lighter M.D on 08/18/2018 at 9:23 AM  Between 7am to 6pm - Pager - [phone]  After 6pm go to [email or web address] - password [email or web address] [facility]  Office  [phone]  CC: Primary care physician; [name], MD

## 2018-08-18 NOTE — Care Management Note (Signed)
Case Management Note  Patient Details  Name: Maurice Patterson MRN: [id number] Date of Birth: May 27, 1956  Subjective/Objective:    Patient is floor status and will be transferred today to 1C.  Report from ICU RN is that the patient's wife does not want the patient to be transferred to the floor.  The patient does not medically need ICU so he will be transferred.  The house supervisor is aware and wants the patient moved to the floor.  Risk Management will be consulted to address family concerns.  Raquel Sarna with [facility] will screen the patient for appropriatness to [facility], MD is in agreement with LTAC- RNCM will discuss this with the patient's wife if patient is eligible for [facility].  Danae Chen with [facility] screened patient and he does not qualify for [place] RN BSN [phone]                  Action/Plan:   Expected Discharge Date:  07/27/18               Expected Discharge Plan:     In-House Referral:     Discharge planning Services  CM Consult  Post Acute Care Choice:    Choice offered to:     DME Arranged:    DME Agency:     HH Arranged:    HH Agency:     Status of Service:  In process, will continue to follow  If discussed at Long Length of Stay Meetings, dates discussed:    Additional Comments:  Shelbie Hutching, RN 08/18/2018, 11:33 AM

## 2018-08-18 NOTE — Care Management Note (Signed)
Case Management Note  Patient Details  Name: Maurice Patterson MRN: [id number] Date of Birth: 10/20/1955  Subjective/Objective:     RNCM spoke with patient's wife she is at the bedside.  Patient is awake and communicating but still lethargic, and speech is not clear.  Patient's wife had a discussion with the house supervisor and the decision was made for the patient to remain in the ICU for tonight.  Wife does not want LTAC, she does not want to drive back and forth to [place].  Wife reports that she would be okay with patient going to [facility] in [place] and she wants PT to come and work with him here.  There is a PT consult ordered.  RNCM will cont to follow. Doran Clay RN BSN [phone]                Action/Plan:   Expected Discharge Date:  07/27/18               Expected Discharge Plan:  [place]  In-House Referral:     Discharge planning Services  CM Consult  Post Acute Care Choice:    Choice offered to:     DME Arranged:    DME Agency:     HH Arranged:    HH Agency:     Status of Service:  In process, will continue to follow  If discussed at Long Length of Stay Meetings, dates discussed:    Additional Comments:  Shelbie Hutching, RN 08/18/2018, 3:41 PM

## 2018-08-18 NOTE — Progress Notes (Addendum)
Patient's wife Cecille Rubin extremely angry that patient has orders to move to floor.  States "Arnetha Gula are going to kill him."  She also flatly refused for him to move to the first floor.  She stated the "respiratory therapist down there almost killed him.  She wouldn't help him and the nurse had to be the one to say he was in trouble."  Cecille Rubin wished to speak to the MD, who was not willing to discuss the transfer with her.  Cecille Rubin stated "I knew she didn't have the guts to talk to me."  Also stated she has "hired a Chief Executive Officer" regarding Joe's beard being shaved off and "I will have her job for that," meaning the NT who shaved his beard off. Eddie Dibbles has been informed and the [facility] is talking with Cecille Rubin now.  For now the transfer has been canceled.  Pt remains very confused, and mostly nonverbal, although he is mostly alert.  He does not reliably respond to his name. He is on room air and tube feeds through Dobhoff; both are tolerated well and VSS

## 2018-08-18 NOTE — Progress Notes (Signed)
08/17/18 2100 Patients wife at bedside; updated on patient condition and informed about possible transfer to floor. Wife was very upset and stated she was told by Dr. Alva Garnet that patient would stay in CCU at least seven more days. Patient asked to speak to provider. NP Dewaine Conger notified and at he spoke with wife.   08/18/18 Patient continues to be on tube feeds; is more responsive and able to follow simple commands.  Patient noted to be very diaphoretic and chills noted. No fever at this time and VSS, will continue to monitor and assess.

## 2018-08-18 NOTE — Treatment Plan (Signed)
Patient is in [place].  Remains in ICU/stepdown area due to lack of availability of beds on the floor.  Not requiring critical care level care.  PCCM will be available as needed but not actively following.

## 2018-08-18 NOTE — Evaluation (Signed)
Clinical/Bedside Swallow Evaluation Patient Details  Name: Maurice Patterson MRN: [id number] Date of Birth: [birth year]  Today's Date: 08/18/2018 Time: SLP Start Time (ACUTE ONLY): 4034 SLP Stop Time (ACUTE ONLY): 1413 SLP Time Calculation (min) (ACUTE ONLY): 38 min  Past Medical History:  Past Medical History:  Diagnosis Date  . Acute MI ([place])   . Arthritis   . Back pain   . CAD (coronary artery disease)   . CHF (congestive heart failure) ([place])   . Chronic kidney disease   . Degenerative lumbar disc   . Dementia ([place])   . Diabetes mellitus without complication ([place])   . GERD (gastroesophageal reflux disease)   . Hernia of abdominal cavity   . Hyperlipemia   . Hypertension   . Malignant intraductal papillary mucinous tumor of pancreas ([place])   . Memory loss   . Pancreatitis   . Seizures ([place])    staring spells  . Shingles   . Stroke ([place]) 08/2017  . TIA (transient ischemic attack)    Past Surgical History:  Past Surgical History:  Procedure Laterality Date  . CARDIAC CATHETERIZATION    . CORONARY ANGIOPLASTY    . CYSTOSCOPY WITH STENT PLACEMENT Right 05/13/2017   Procedure: CYSTOSCOPY WITH STENT PLACEMENT;  Surgeon: Nickie Retort, MD;  Location: [facility] ORS;  Service: Urology;  Laterality: Right;  . ERCP N/A 09/12/2015   Procedure: ENDOSCOPIC RETROGRADE CHOLANGIOPANCREATOGRAPHY (ERCP);  Surgeon: Hulen Luster, MD;  Location: [facility] ENDOSCOPY;  Service: Gastroenterology;  Laterality: N/A;  . ERCP N/A 01/02/2016   Procedure: ENDOSCOPIC RETROGRADE CHOLANGIOPANCREATOGRAPHY (ERCP);  Surgeon: Hulen Luster, MD;  Location: [facility] ENDOSCOPY;  Service: Gastroenterology;  Laterality: N/A;  . EXTERNAL FIXATION WRIST FRACTURE Left   . left arm metal plate    . LEFT HEART CATH AND CORONARY ANGIOGRAPHY Left 11/04/2016   Procedure: Left Heart Cath and Coronary Angiography;  Surgeon: Isaias Cowman, MD;  Location: [facility] CV LAB;  Service: Cardiovascular;  Laterality: Left;  . Left  shoulder surgery    . Stent times 3  2013   Cardiac  . TOTAL ELBOW REPLACEMENT Left   . VASECTOMY    . VENTRICULOPERITONEAL SHUNT     HPI:      Assessment / Plan / Recommendation Clinical Impression   Patient presents with moderate-severe dysphagia (most likely oropharyngeal dysphagia) d/t following characteristics: fluctuating alertness/lethargy, report of cognitive deficits, self-feeding deficits, prolonged intubation period, prolonged period of NPO status, hx of PNA, and overt s/s of aspiration noted during bedside swallow eval. D/t patient's lethargy, formal oral motor examination unable to be completed. During today's clinical assessment, patient demonstrated the following characteristics:  Decreased labial seal with resting open mouth posture, decreased oral acceptance of ice chips presented, oral holding of ice chips (however oral motor movement occurred following tactile stimulation and verbal cues), no coughing/choking/throat clearing occurred with ice chips. SLP utilized spoon dip of puree on outer labial area near entrance of oral cavity in order to increase stimulation and assess oral motor movement. Patient did independently initiate movement of tongue to outer labial area followed by pharyngeal movement (potentially a reflexive swallow), however delayed coughing did occur and spoon-dip of puree was ceased. Patient's lethargy increased as session progressed and ice chips were ceased at that time as well.  Patient's wife and sister were present, stating goal of patient consuming some type of diet. Education provided re: severity of patient's risk factors for aspiration PNA, however infrequent/small ice chips following thorough oral care by nursing staff  could be appropriate as long as the patient was alert, awake, and positioned upright. Family verbalized preference to proceed with ice chips sparingly following thorough oral care.  D/t family preference, SLP to recommend NPO with ice chips  sparingly following thorough oral care - Patient MUST be alert, awake, and sitting upright in bed/chair without compromised respiratory issues and/or significant change in vitals. Nursing verbalized understanding and agreement to recommendations.   SLP Visit Diagnosis: Dysphagia, oropharyngeal phase (R13.12)    Aspiration Risk  Moderate aspiration risk;Severe aspiration risk;Risk for inadequate nutrition/hydration    Diet Recommendation NPO;Ice chips PRN after oral care   Medication Administration: Via alternative means Postural Changes: Seated upright at 90 degrees    Other  Recommendations Recommended Consults: Consider ENT evaluation Oral Care Recommendations: Oral care QID;Staff/trained caregiver to provide oral care   Follow up Recommendations Skilled Nursing facility      Frequency and Duration min 3x week  2 weeks       Prognosis Prognosis for Safe Diet Advancement: Guarded Barriers to Reach Goals: Cognitive deficits;Severity of deficits      Swallow Study   General Date of Onset: 07/23/18 Type of Study: Bedside Swallow Evaluation Previous Swallow Assessment: approximately 2 weeks ago Diet Prior to this Study: NPO Temperature Spikes Noted: No Respiratory Status: Room air History of Recent Intubation: Yes Date extubated: 08/14/18 Behavior/Cognition: Lethargic/Drowsy Oral Cavity Assessment: Dried secretions Oral Care Completed by SLP: Yes Oral Cavity - Dentition: Edentulous Self-Feeding Abilities: Total assist Patient Positioning: Upright in bed Volitional Cough: Cognitively unable to elicit Volitional Swallow: Unable to elicit    Oral/Motor/Sensory Function Overall Oral Motor/Sensory Function: Other (comment)(Unable to fully assess d/t patient's severe lethargy)   Ice Chips Ice chips: Impaired Presentation: Spoon Oral Phase Impairments: Reduced labial seal;Reduced lingual movement/coordination;Poor awareness of bolus Oral Phase Functional Implications: Oral  holding   Thin Liquid Thin Liquid: Not tested    Nectar Thick Nectar Thick Liquid: Not tested   Honey Thick Honey Thick Liquid: Not tested   Puree Puree: Impaired Presentation: (SMALL spoon dip to outer labial area at entrance to oral cavity.) Oral Phase Functional Implications: Oral holding;Prolonged oral transit Pharyngeal Phase Impairments: Cough - Delayed Other Comments: Spoon dip of puree utilized to encourage oral motor movement. Patient did reponds with licking of lips with pharyngeal movement, potentially indicating a reflexive swallow. However no tsp of bolus was utilized.    Solid     Solid: Not tested     Loni Beckwith, M.S. CCC-SLP Speech-Language Pathologist  Loni Beckwith 08/18/2018,2:44 PM

## 2018-08-19 LAB — BASIC METABOLIC PANEL
Anion gap: 9 (ref 5–15)
BUN: 19 mg/dL (ref 8–23)
CALCIUM: 8.3 mg/dL — AB (ref 8.9–10.3)
CO2: 25 mmol/L (ref 22–32)
Chloride: 108 mmol/L (ref 98–111)
Creatinine, Ser: 0.65 mg/dL (ref 0.61–1.24)
GFR calc Af Amer: >60 mL/min (ref >60)
GFR calc non Af Amer: >60 mL/min (ref >60)
Glucose, Bld: 240 mg/dL — ABNORMAL HIGH (ref 70–99)
Potassium: 3.6 mmol/L (ref 3.5–5.1)
Sodium: 142 mmol/L (ref 135–145)

## 2018-08-19 LAB — PHOSPHORUS: Phosphorus: 3 mg/dL (ref 2.5–4.6)

## 2018-08-19 LAB — GLUCOSE, CAPILLARY
Glucose-Capillary: 227 mg/dL — ABNORMAL HIGH (ref 70–99)
Glucose-Capillary: 234 mg/dL — ABNORMAL HIGH (ref 70–99)
Glucose-Capillary: 219 mg/dL — ABNORMAL HIGH (ref 70–99)
Glucose-Capillary: 229 mg/dL — ABNORMAL HIGH (ref 70–99)
Glucose-Capillary: 292 mg/dL — ABNORMAL HIGH (ref 70–99)
Glucose-Capillary: 256 mg/dL — ABNORMAL HIGH (ref 70–99)

## 2018-08-19 LAB — CBC
HCT: 40.6 % (ref 39.0–52.0)
Hemoglobin: 12.4 g/dL — ABNORMAL LOW (ref 13.0–17.0)
MCH: 29.5 pg (ref 26.0–34.0)
MCHC: 30.5 g/dL (ref 30.0–36.0)
MCV: 96.7 fL (ref 80.0–100.0)
Platelets: 182 10*3/uL (ref 150–400)
RBC: 4.2 MIL/uL — ABNORMAL LOW (ref 4.22–5.81)
RDW: 17.1 % — ABNORMAL HIGH (ref 11.5–15.5)
WBC: 6.7 10*3/uL (ref 4.0–10.5)
nRBC: 0 % (ref 0.0–0.2)

## 2018-08-19 LAB — MAGNESIUM: MAGNESIUM: 2.2 mg/dL (ref 1.7–2.4)

## 2018-08-19 MED ORDER — QUETIAPINE FUMARATE 25 MG PO TABS
50.0000 mg | ORAL_TABLET | Freq: Every day | ORAL | Status: DC
  Administered 2018-08-19: 50 mg via ORAL
  Filled 2018-08-19: qty 2

## 2018-08-19 NOTE — Progress Notes (Signed)
[facility] NAME: Maurice Patterson    MR#:  [id number]  DATE OF BIRTH:  05-17-1956  SUBJECTIVE:  CHIEF COMPLAINT:  No chief complaint on file.  -Appears about the same.  Was agitated last night and received a dose of Seroquel that helped him rest better.  This morning still lethargic, opening eyes and following some simple commands.  REVIEW OF SYSTEMS:  Review of Systems  Unable to perform ROS: Medical condition    DRUG ALLERGIES:   Allergies  Allergen Reactions  . Hydrocodone Anaphylaxis  . Morphine Other (See Comments)    Loss of memory  . Ambien [Zolpidem] Other (See Comments)    delirium    . Brilinta [Ticagrelor] Other (See Comments)    Stroke   . Flexeril [Cyclobenzaprine] Other (See Comments)    delerium   . Flunitrazepam Other (See Comments)    ROHYPNOL (hallucinations)  . Haldol [Haloperidol Lactate] Other (See Comments)    delerium   . Levetiracetam Diarrhea and Other (See Comments)    Unable to walk  . Lorazepam Hives  . Risperdal [Risperidone] Other (See Comments)    Delirium   . Trazodone Other (See Comments)    Delirium Can take in low doses   . Benadryl [Diphenhydramine Hcl (Sleep)] Rash  . Penicillins Rash    Mouth ulcers Has patient had a PCN reaction causing immediate rash, facial/tongue/throat swelling, SOB or lightheadedness with hypotension: Yes Has patient had a PCN reaction causing severe rash involving mucus membranes or skin necrosis: No Has patient had a PCN reaction that required hospitalization No Has patient had a PCN reaction occurring within the last 10 years: Yes If all of the above answers are "NO", then may proceed with Cephalosporin use.    VITALS:  Blood pressure 123/87, pulse 88, temperature 98.2 F (36.8 C), temperature source Axillary, resp. rate 18, height 5\' 6"  (1.676 m), weight 64.7 kg, SpO2 97 %.  PHYSICAL EXAMINATION:  Physical Exam   GENERAL:  62  y.o.-year-old patient lying in the bed with no acute distress.  EYES: Pupils equal, round, reactive to light and accommodation. No scleral icterus. Extraocular muscles intact.  HEENT: Head atraumatic, normocephalic. Oropharynx and nasopharynx clear.  NECK:  Supple, no jugular venous distention. No thyroid enlargement, no tenderness.  LUNGS: Normal breath sounds bilaterally, no wheezing, rales,rhonchi or crepitation. No use of accessory muscles of respiration.  Decreased bibasilar breath sounds CARDIOVASCULAR: S1, S2 normal. No murmurs, rubs, or gallops.  ABDOMEN: Soft, nontender, nondistended. Bowel sounds present. No organomegaly or mass.  EXTREMITIES: No pedal edema, cyanosis, or clubbing.  NEUROLOGIC: Cranial nerves II through XII are intact.  No facial droop noted.  Both hands in mittens.  Able to follow some simple commands.  Does not respond by speaking.  Nodding head occasionally.   Sensation intact. Gait not checked.  PSYCHIATRIC: The patient is lethargic and sometimes following some simple commands SKIN: No obvious rash, lesion, or ulcer.    LABORATORY PANEL:   CBC Recent Labs  Lab 08/19/18 0442  WBC 6.7  HGB 12.4*  HCT 40.6  PLT 182   ------------------------------------------------------------------------------------------------------------------  Chemistries  Recent Labs  Lab 08/19/18 0442  NA 142  K 3.6  CL 108  CO2 25  GLUCOSE 240*  BUN 19  CREATININE 0.65  CALCIUM 8.3*  MG 2.2   ------------------------------------------------------------------------------------------------------------------  Cardiac Enzymes No results for input(s): TROPONINI in the last 168 hours. ------------------------------------------------------------------------------------------------------------------  RADIOLOGY:  Dg Abd 1  View  Result Date: 08/17/2018 CLINICAL DATA:  Feeding tube placement EXAM: ABDOMEN - 1 VIEW COMPARISON:  None. FINDINGS: Feeding tube tip is in the  stomach. There is no bowel dilatation or air-fluid level to suggest bowel obstruction. No free air. There is mild atelectatic change in the medial left base. IMPRESSION: Feeding tube tip in stomach. No bowel obstruction or free air evident. Electronically Signed   By: Lowella Grip III M.D.   On: 08/17/2018 13:42   Dg Chest Port 1 View  Result Date: 08/17/2018 CLINICAL DATA:  Acute respiratory failure EXAM: PORTABLE CHEST 1 VIEW COMPARISON:  08/12/2018 FINDINGS: Cardiac shadow is stable. Feeding catheter is noted coiled within the stomach. The endotracheal tube has been removed in the interval. The lungs are well aerated bilaterally. Some linear scarring is noted in the left base. No acute infiltrate is seen. IMPRESSION: No acute abnormality noted Electronically Signed   By: Inez Catalina M.D.   On: 08/17/2018 23:21    EKG:   Orders placed or performed during the hospital encounter of 07/22/18  . EKG 12-Lead  . EKG 12-Lead  . EKG 12-Lead  . EKG 12-Lead  . ED EKG  . ED EKG    ASSESSMENT AND PLAN:   62 year old male with past medical history significant for CAD, CHF, CKD, dementia, diabetes, hypertension and seizure disorder presents to hospital secondary to worsening weakness and poor appetite.  1.  Acute encephalopathy-unclear etiology at this time. -Patient had MRI with and without contrast on admission that did not show anything acute, initial EEG concern for generalized slowing. -Metabolic encephalopathy.  Lumbar puncture also was done and negative for any Gram stain organisms. -Patient was treated empirically for possible herpes encephalitis -Mentation is slightly improved that patient is alert and following some simple commands.  Still not close to baseline according to wife. -Appreciate neurology consult.  Vimpat and Lamictal discontinued as concern for mental status changes on those new medications according to wife. -On Provigil during daytime  2.  Acute hypoxic respiratory  failure-patient admitted to the hospital on 07/23/2018, intubated on 07/29/2018 initially, were reintubated again on 08/07/2018 after self extubation. -Aspiration pneumonia and suspected.  Currently extubated.  Finished IV antibiotics -Speech therapy consult alert.  Until then receiving tube feeds  3.  Hypokalemia and hyponatremia.  Improved after replacements  4.  Dementia-continue Namenda, aricept.  But independent and interactive at baseline according to wife  5.  Diabetes mellitus-on Lantus and sliding scale insulin  6.    DVT prophylaxis-on Lovenox  7.  Nutrition-still has a Dobbhoff tube and tube feeds  Physical therapy consulted.  Likely will need placement   All the records are reviewed and case discussed with Care Management/Social Workerr. Management plans discussed with the patient, family and they are in agreement.  CODE STATUS: Full code  TOTAL TIME TAKING CARE OF THIS PATIENT: 28 minutes.   POSSIBLE D/C IN 2-3 DAYS, DEPENDING ON CLINICAL CONDITION.   Gladstone Lighter M.D on 08/19/2018 at 10:41 AM  Between 7am to 6pm - Pager - [phone]  After 6pm go to [email or web address] - password [email or web address] [facility]  Office  [phone]  CC: Primary care physician; [name], MD

## 2018-08-19 NOTE — Treatment Plan (Signed)
Following peripherally, nurse approached me about his episodes of somnolence and agitation.  I have "cleaned up" his MAR.  He had been on Provigil for reasons are not clear to me and this was discontinued.  Discontinue Seroquel as the patient is too somnolent after dose last night.  Patient should be transferred to the general medical floor.  His prognosis overall is very poor suspect that his issues are related to progressive dementia.  Wife is reluctant to discuss end-of-life issues and goals of care.  PCCM will sign off.

## 2018-08-19 NOTE — Progress Notes (Signed)
Agitated overnight. No respiratory or hemodynamic issues. Medicated with seroquel 50mg  x 1 with good effect. Patient slept for the rest of the night. PCCM not actively following. PCCM available prn.    Alyxander Kollmann S. [facility] ANP-BC Pulmonary and Critical Care Medicine [facility] Pager [phone] or [phone]  NB: This document was prepared using Dragon voice recognition software and may include unintentional dictation errors.

## 2018-08-19 NOTE — Plan of Care (Addendum)
Pt continues with alternating lethargy/restlessness/agitation.  He remains on RA with VSS.  Wife at bedside at this time.  States she has told patient's brother (who is a cardiology PA) he "does not have a say in what happens to Maurice Patterson."  Maurice Patterson states that while Maurice Patterson's brother was visiting this morning he told her on the phone that he "was going to go and find the doctor because he thinks he can have Maurice Patterson moved.  He thinks he can make him a DNR."   Writing RN spoke with the brother at the patient's bedside this morning and he did not mention wanting to speak with the physician.  He did say that he has told Maurice Patterson he feels his brother should be allowed to pass away naturally if he respiratory arrests again but he understands her hesitation, which according to him she attributes to her daughter Maurice Patterson, not herself.

## 2018-08-20 LAB — GLUCOSE, CAPILLARY
GLUCOSE-CAPILLARY: 254 mg/dL — AB (ref 70–99)
Glucose-Capillary: 255 mg/dL — ABNORMAL HIGH (ref 70–99)
Glucose-Capillary: 265 mg/dL — ABNORMAL HIGH (ref 70–99)
Glucose-Capillary: 296 mg/dL — ABNORMAL HIGH (ref 70–99)
Glucose-Capillary: 298 mg/dL — ABNORMAL HIGH (ref 70–99)
Glucose-Capillary: 315 mg/dL — ABNORMAL HIGH (ref 70–99)

## 2018-08-20 NOTE — Progress Notes (Signed)
[place] at [facility] NAME: Maurice Patterson    MR#:  [id number]  DATE OF BIRTH:  1956/01/26  SUBJECTIVE:  CHIEF COMPLAINT:  No chief complaint on file.  -Alert and tracking, not following commands -Still has Dobbhoff tube in and  tube feeds  REVIEW OF SYSTEMS:  Review of Systems  Unable to perform ROS: Medical condition    DRUG ALLERGIES:   Allergies  Allergen Reactions  . Hydrocodone Anaphylaxis  . Morphine Other (See Comments)    Loss of memory  . Ambien [Zolpidem] Other (See Comments)    delirium    . Brilinta [Ticagrelor] Other (See Comments)    Stroke   . Flexeril [Cyclobenzaprine] Other (See Comments)    delerium   . Flunitrazepam Other (See Comments)    ROHYPNOL (hallucinations)  . Haldol [Haloperidol Lactate] Other (See Comments)    delerium   . Levetiracetam Diarrhea and Other (See Comments)    Unable to walk  . Lorazepam Hives  . Risperdal [Risperidone] Other (See Comments)    Delirium   . Trazodone Other (See Comments)    Delirium Can take in low doses   . Benadryl [Diphenhydramine Hcl (Sleep)] Rash  . Penicillins Rash    Mouth ulcers Has patient had a PCN reaction causing immediate rash, facial/tongue/throat swelling, SOB or lightheadedness with hypotension: Yes Has patient had a PCN reaction causing severe rash involving mucus membranes or skin necrosis: No Has patient had a PCN reaction that required hospitalization No Has patient had a PCN reaction occurring within the last 10 years: Yes If all of the above answers are "NO", then may proceed with Cephalosporin use.    VITALS:  Blood pressure 139/71, pulse 94, temperature 97.8 F (36.6 C), temperature source Axillary, resp. rate (!) 21, height 5\' 6"  (1.676 m), weight 62.3 kg, SpO2 97 %.  PHYSICAL EXAMINATION:  Physical Exam   GENERAL:  62 y.o.-year-old patient lying in the bed with no acute distress.  EYES: Pupils equal, round, reactive to light  and accommodation. No scleral icterus. Extraocular muscles intact.  HEENT: Head atraumatic, normocephalic. Oropharynx and nasopharynx clear.  NECK:  Supple, no jugular venous distention. No thyroid enlargement, no tenderness.  LUNGS: Normal breath sounds bilaterally, no wheezing, rales,rhonchi or crepitation. No use of accessory muscles of respiration.  Decreased bibasilar breath sounds CARDIOVASCULAR: S1, S2 normal. No murmurs, rubs, or gallops.  ABDOMEN: Soft, nontender, nondistended. Bowel sounds present. No organomegaly or mass.  EXTREMITIES: No pedal edema, cyanosis, or clubbing.  NEUROLOGIC: Cranial nerves II through XII are intact.  No facial droop noted.  Both hands in mittens.  Nonverbal.  Tracking.  Nodding head occasionally.   Sensation intact. Gait not checked.  PSYCHIATRIC: The patient is alert and tracking, not following commands.  Remains nonverbal SKIN: No obvious rash, lesion, or ulcer.    LABORATORY PANEL:   CBC Recent Labs  Lab 08/19/18 0442  WBC 6.7  HGB 12.4*  HCT 40.6  PLT 182   ------------------------------------------------------------------------------------------------------------------  Chemistries  Recent Labs  Lab 08/19/18 0442  NA 142  K 3.6  CL 108  CO2 25  GLUCOSE 240*  BUN 19  CREATININE 0.65  CALCIUM 8.3*  MG 2.2   ------------------------------------------------------------------------------------------------------------------  Cardiac Enzymes No results for input(s): TROPONINI in the last 168 hours. ------------------------------------------------------------------------------------------------------------------  RADIOLOGY:  No results found.  EKG:   Orders placed or performed during the hospital encounter of 07/22/18  . EKG 12-Lead  . EKG 12-Lead  .  EKG 12-Lead  . EKG 12-Lead  . ED EKG  . ED EKG    ASSESSMENT AND PLAN:   62 year old male with past medical history significant for CAD, CHF, CKD, dementia, diabetes,  hypertension and seizure disorder presents to hospital secondary to worsening weakness and poor appetite.  1.  Acute encephalopathy-unclear etiology at this time. -Concern for worsening dementia -Patient had MRI with and without contrast on admission that did not show anything acute, initial EEG concern for generalized slowing. -Metabolic encephalopathy.  Lumbar puncture also was done and negative for any Gram stain organisms. -Patient was treated empirically for possible herpes encephalitis -Mentation is slightly improved that patient is alert and following some simple commands.  Still not close to baseline according to wife. -Appreciate neurology consult.  Vimpat and Lamictal discontinued as concern for mental status changes on those new medications according to wife. -Also discontinued Seroquel and Provigil  2.  Acute hypoxic respiratory failure-patient admitted to the hospital on 07/23/2018, intubated on 07/29/2018 initially, was reintubated again on 08/07/2018. -Aspiration pneumonia and suspected.  Currently extubated.  Finished IV antibiotics -Speech therapy consult when more alert.  Until then receiving tube feeds  3.  Hypokalemia and hyponatremia.  Improved after replacements  4.  Dementia-continue Namenda, aricept.  But independent and interactive at baseline according to wife  5.  Diabetes mellitus-on Lantus and sliding scale insulin  6.    DVT prophylaxis-on Lovenox  7.  Nutrition-still has a Dobbhoff tube and tube feeds  Physical therapy consulted.  Likely will need placement   All the records are reviewed and case discussed with Care Management/Social Workerr. Management plans discussed with the patient, family and they are in agreement.  CODE STATUS: Full code  TOTAL TIME TAKING CARE OF THIS PATIENT: 28 minutes.   POSSIBLE D/C IN 2-3 DAYS, DEPENDING ON CLINICAL CONDITION.   Gladstone Lighter M.D on 08/20/2018 at 11:25 AM  Between 7am to 6pm - Pager -  [phone]  After 6pm go to [email or web address] - password [email or web address] [place] [facility]  Office  [phone]  CC: Primary care physician; [name], MD

## 2018-08-20 NOTE — Progress Notes (Signed)
Physical Therapy Treatment Patient Details Name: Maurice Patterson MRN: [id number] DOB: [birth year] Today's Date: 08/20/2018    History of Present Illness 62 y/o male here with pneumonia and respiratory distress has intubated (on and off) and ultimately extubated 12/23.      PT Comments    Patient unable to assist with exercises, or follow commands this visit. Patient requires total assist to perform supine LE exercises including: ap, heel slides, hip abd/add, slr x 10 reps each. Attempted UE strengthening exercises, patient unable to perform and resisting mobility.    Follow Up Recommendations  SNF     Equipment Recommendations       Recommendations for Other Services       Precautions / Restrictions Precautions Precautions: Fall Restrictions Weight Bearing Restrictions: No    Mobility  Bed Mobility               General bed mobility comments: (Patient unable to participate due to inability to follow commands, attention level. )  Transfers                 General transfer comment: all mobility deferred secondary to mental status  Ambulation/Gait                 Stairs             Wheelchair Mobility    Modified Rankin (Stroke Patients Only)       Balance                                            Cognition Arousal/Alertness: Lethargic Behavior During Therapy: Flat affect Overall Cognitive Status: Impaired/Different from baseline Area of Impairment: Orientation;Attention;Awareness;Following commands                 Orientation Level: Disoriented to;Person;Place;Time;Situation             General Comments: briefly opens eyes but falls quickly back to sleep.      Exercises Other Exercises Other Exercises: supine PROM BLE : AP, Heel slides, Hip abd/add, SLR x 10 reps each    General Comments        Pertinent Vitals/Pain Pain Assessment: No/denies pain    Home Living                       Prior Function            PT Goals (current goals can now be found in the care plan section) Acute Rehab PT Goals Patient Stated Goal: per daughter, get back to watching grandkids and being active PT Goal Formulation: With patient Time For Goal Achievement: 08/29/18 Potential to Achieve Goals: Poor Progress towards PT goals: Not progressing toward goals - comment(patient remains unable to assist with mobility or therex.)    Frequency    Min 2X/week      PT Plan Current plan remains appropriate    Co-evaluation              AM-PAC PT "6 Clicks" Mobility   Outcome Measure  Help needed turning from your back to your side while in a flat bed without using bedrails?: Total Help needed moving from lying on your back to sitting on the side of a flat bed without using bedrails?: Total Help needed moving to and from a bed to a chair (including a wheelchair)?: Total Help  needed standing up from a chair using your arms (e.g., wheelchair or bedside chair)?: Total Help needed to walk in hospital room?: Total Help needed climbing 3-5 steps with a railing? : Total 6 Click Score: 6    End of Session   Activity Tolerance: Patient limited by fatigue;Patient limited by lethargy Patient left: in bed;with bed alarm set Nurse Communication: Mobility status PT Visit Diagnosis: Muscle weakness (generalized) (M62.81)     Time: 8676-7209 PT Time Calculation (min) (ACUTE ONLY): 10 min  Charges:  $Therapeutic Exercise: 8-22 mins                     Wladyslaw Henrichs, PT, GCS 08/20/18,12:03 PM

## 2018-08-21 DIAGNOSIS — Z431 Encounter for attention to gastrostomy: Secondary | ICD-10-CM

## 2018-08-21 LAB — GLUCOSE, CAPILLARY
GLUCOSE-CAPILLARY: 224 mg/dL — AB (ref 70–99)
Glucose-Capillary: 283 mg/dL — ABNORMAL HIGH (ref 70–99)
Glucose-Capillary: 298 mg/dL — ABNORMAL HIGH (ref 70–99)
Glucose-Capillary: 255 mg/dL — ABNORMAL HIGH (ref 70–99)
Glucose-Capillary: 221 mg/dL — ABNORMAL HIGH (ref 70–99)

## 2018-08-21 MED ORDER — INSULIN GLARGINE 100 UNIT/ML ~~LOC~~ SOLN
15.0000 [IU] | Freq: Two times a day (BID) | SUBCUTANEOUS | Status: DC
  Administered 2018-08-21 – 2018-08-27 (×11): 15 [IU] via SUBCUTANEOUS
  Filled 2018-08-21 (×14): qty 0.15

## 2018-08-21 MED ORDER — FREE WATER
180.0000 mL | Status: DC
  Administered 2018-08-21 – 2018-08-24 (×18): 180 mL

## 2018-08-21 MED ORDER — GLUCERNA 1.5 CAL PO LIQD
1000.0000 mL | ORAL | Status: DC
  Administered 2018-08-21 – 2018-08-23 (×3): 1000 mL

## 2018-08-21 NOTE — Progress Notes (Signed)
[place] at [place] NAME: Maurice Patterson    MR#:  [id number]  DATE OF BIRTH:  02-21-1956  SUBJECTIVE:  CHIEF COMPLAINT:  No chief complaint on file.  -More alert today and following simple commands Not very interactive though.  Getting tube feeds  REVIEW OF SYSTEMS:  Review of Systems  Unable to perform ROS: Medical condition    DRUG ALLERGIES:   Allergies  Allergen Reactions  . Hydrocodone Anaphylaxis  . Morphine Other (See Comments)    Loss of memory  . Ambien [Zolpidem] Other (See Comments)    delirium    . Brilinta [Ticagrelor] Other (See Comments)    Stroke   . Flexeril [Cyclobenzaprine] Other (See Comments)    delerium   . Flunitrazepam Other (See Comments)    ROHYPNOL (hallucinations)  . Haldol [Haloperidol Lactate] Other (See Comments)    delerium   . Levetiracetam Diarrhea and Other (See Comments)    Unable to walk  . Lorazepam Hives  . Risperdal [Risperidone] Other (See Comments)    Delirium   . Trazodone Other (See Comments)    Delirium Can take in low doses   . Benadryl [Diphenhydramine Hcl (Sleep)] Rash  . Penicillins Rash    Mouth ulcers Has patient had a PCN reaction causing immediate rash, facial/tongue/throat swelling, SOB or lightheadedness with hypotension: Yes Has patient had a PCN reaction causing severe rash involving mucus membranes or skin necrosis: No Has patient had a PCN reaction that required hospitalization No Has patient had a PCN reaction occurring within the last 10 years: Yes If all of the above answers are "NO", then may proceed with Cephalosporin use.    VITALS:  Blood pressure (!) 142/82, pulse 84, temperature 98.1 F (36.7 C), temperature source Axillary, resp. rate 18, height 5\' 6"  (1.676 m), weight 60.1 kg, SpO2 97 %.  PHYSICAL EXAMINATION:  Physical Exam   GENERAL:  62 y.o.-year-old patient lying in the bed with no acute distress.  EYES: Pupils equal, round, reactive  to light and accommodation. No scleral icterus. Extraocular muscles intact.  HEENT: Head atraumatic, normocephalic. Oropharynx and nasopharynx clear.  NECK:  Supple, no jugular venous distention. No thyroid enlargement, no tenderness.  LUNGS: Normal breath sounds bilaterally, no wheezing, rales,rhonchi or crepitation. No use of accessory muscles of respiration.  Decreased bibasilar breath sounds CARDIOVASCULAR: S1, S2 normal. No murmurs, rubs, or gallops.  ABDOMEN: Soft, nontender, nondistended. Bowel sounds present. No organomegaly or mass.  EXTREMITIES: No pedal edema, cyanosis, or clubbing.  NEUROLOGIC: Cranial nerves II through XII are intact.  No facial droop noted.  Both hands in mittens.  Nonverbal.  Tracking.  Nodding head to some questions and following simple commands.   Sensation intact. Gait not checked.  PSYCHIATRIC: The patient is alert and tracking, following simple commands.  Not very interactive.   SKIN: No obvious rash, lesion, or ulcer.    LABORATORY PANEL:   CBC Recent Labs  Lab 08/19/18 0442  WBC 6.7  HGB 12.4*  HCT 40.6  PLT 182   ------------------------------------------------------------------------------------------------------------------  Chemistries  Recent Labs  Lab 08/19/18 0442  NA 142  K 3.6  CL 108  CO2 25  GLUCOSE 240*  BUN 19  CREATININE 0.65  CALCIUM 8.3*  MG 2.2   ------------------------------------------------------------------------------------------------------------------  Cardiac Enzymes No results for input(s): TROPONINI in the last 168 hours. ------------------------------------------------------------------------------------------------------------------  RADIOLOGY:  No results found.  EKG:   Orders placed or performed during the hospital encounter of 07/22/18  .  EKG 12-Lead  . EKG 12-Lead  . EKG 12-Lead  . EKG 12-Lead  . ED EKG  . ED EKG    ASSESSMENT AND PLAN:   62 year old male with past medical history  significant for CAD, CHF, CKD, dementia, diabetes, hypertension and seizure disorder presents to hospital secondary to worsening weakness and poor appetite.  1.  Acute encephalopathy-unclear etiology at this time. -Concern for worsening dementia -Patient had MRI with and without contrast on admission that did not show anything acute, initial EEG concern for generalized slowing. -Metabolic encephalopathy.  Lumbar puncture also was done and negative for any Gram stain organisms. -Patient was treated empirically for possible herpes encephalitis -Mentation is slightly improved that patient is alert and following some simple commands.  Still not close to baseline according to wife. -Appreciate neurology consult.  Vimpat and Lamictal , Seroquel and Provigil-all discontinued now.  2.  Acute hypoxic respiratory failure-patient admitted to the hospital on 07/23/2018, intubated on 07/29/2018 initially, was reintubated again on 08/07/2018. -Aspiration pneumonia and suspected.  Currently extubated.  Finished IV antibiotics -Speech therapy consult when more alert.  Until then receiving tube feeds  3.  Hypokalemia and hyponatremia.  Improved after replacements  4.  Dementia-continue Namenda, aricept.  But independent and interactive at baseline according to wife  5.  Diabetes mellitus-on Lantus and sliding scale insulin  6.    DVT prophylaxis-on Lovenox  7.  Nutrition-still has a Dobbhoff tube and tube feeds  Physical therapy consulted.  Likely will need placement   All the records are reviewed and case discussed with Care Management/Social Workerr. Management plans discussed with the patient, family and they are in agreement.  CODE STATUS: Full code  TOTAL TIME TAKING CARE OF THIS PATIENT: 28 minutes.   POSSIBLE D/C IN 2-3 DAYS, DEPENDING ON CLINICAL CONDITION.   Gladstone Lighter M.D on 08/21/2018 at 9:41 AM  Between 7am to 6pm - Pager - [phone]  After 6pm go to [email or web address] -  password [email or web address] [place] [facility]  Office  [phone]  CC: Primary care physician; [name], MD

## 2018-08-21 NOTE — Progress Notes (Signed)
SLP Cancellation Note  Patient Details Name: Maurice Patterson MRN: [id number] DOB: 05/17/1956   Cancelled treatment:       Reason Eval/Treat Not Completed: Fatigue/lethargy limiting ability to participate; Patient receiving breathing tx upon arrival. Observed lying in bed asleep with decreased alertness, unable to participate in today's session. Family members present in room. Education provided to them re: patient's NPO status with ice chips following strict oral care. Family member verbalized understanding. SLP called wife on phone to clarify additional BSE order since initial eval on Friday. Wife stated preference for MBSS. Education provided re: patient's most likely not appropriate at this time d/t decreased alertness and lethargy, however can schedule once patient becomes more alertn and medically stable if family preference is MBSS. Wife verbalized understanding and agreement. Will f/u with patient as alertness increases.  Loni Beckwith, M.S. CCC-SLP Speech-Language Pathologist   Loni Beckwith 08/21/2018, 3:08 PM

## 2018-08-21 NOTE — Progress Notes (Signed)
Inpatient Diabetes Program Recommendations  AACE/ADA: New Consensus Statement on Inpatient Glycemic Control (2019)  Target Ranges:  Prepandial:   less than 140 mg/dL      Peak postprandial:   less than 180 mg/dL (1-2 hours)      Critically ill patients:  140 - 180 mg/dL   Results for Maurice Patterson, Maurice Patterson (MRN [id number]) as of 08/21/2018 10:28  Ref. Range 08/20/2018 07:50 08/20/2018 12:05 08/20/2018 16:26 08/20/2018 19:57 08/20/2018 23:40 08/21/2018 03:32 08/21/2018 07:33  Glucose-Capillary Latest Ref Range: 70 - 99 mg/dL 265 (H)  Novolog 8 units  Lantus 15 units 296 (H)  Novolog 8 units 298 (H)  Novolog 8 units 315 (H)  Novolog 11 units 254 (H)  Novolog 8 units 224 (H)  Novolog 5 units 283 (H)  Novolog 8 units   Review of Glycemic Control  Diabetes history: DM2 Outpatient Diabetes medications: NPH 34 units BID, Regular 34 units TID with meals plus additional units for correction, Metformin 1000 mg BID Current orders for Inpatient glycemic control: Lantus 15 units daily, Novolog 0-15 units Q4H  Inpatient Diabetes Program Recommendations:   Insulin - Basal: Please consider increasing Lantus to 15 units BID.  Insulin - Tube Feeding Coverage: Please consider ordering Novolog 4 units Q4H for tube feeding coverage. If tube feeding is stopped or held then Novolog tube feeding coverage should also be stopped or held.  Thanks, Barnie Alderman, RN, MSN, CDE Diabetes Coordinator Inpatient Diabetes Program [phone] (Team Pager from 8am to 5pm)

## 2018-08-21 NOTE — Progress Notes (Signed)
Physical Therapy Treatment Patient Details Name: Maurice Patterson MRN: [id number] DOB: Apr 13, 1956 Today's Date: 08/21/2018    History of Present Illness 62 y/o male here with pneumonia and respiratory distress has intubated (on and off) and ultimately extubated 12/23.      PT Comments    Patient more restless this day upon arrival. Opens eyes to name, however unable to respond otherwise. Patient with some improvement with following direction this visit. Performed 2-4 reps of SLR independently when cued. Otherwise all LE therex performed passively.  Continue to work on improving overall mobility and strength as patient able.      Follow Up Recommendations  SNF     Equipment Recommendations       Recommendations for Other Services       Precautions / Restrictions Precautions Precautions: Fall Restrictions Weight Bearing Restrictions: No    Mobility  Bed Mobility Overal bed mobility: Needs Assistance                Transfers                 General transfer comment: patient unable to follow direction/commands effectively to participate in therapy   Ambulation/Gait                 Stairs             Wheelchair Mobility    Modified Rankin (Stroke Patients Only)       Balance                                            Cognition Arousal/Alertness: Lethargic Behavior During Therapy: Restless Overall Cognitive Status: Impaired/Different from baseline Area of Impairment: Attention;Following commands;Awareness;Orientation                 Orientation Level: Disoriented to;Place;Time;Situation     Following Commands: Follows one step commands inconsistently Safety/Judgement: Decreased awareness of deficits;Decreased awareness of safety            Exercises Other Exercises Other Exercises: supine PROM BLE : AP, Heel slides, Hip abd/add, SLR x 10 reps each. Active SLR x 2-4 reps bilaterally    General  Comments        Pertinent Vitals/Pain Pain Assessment: No/denies pain    Home Living                      Prior Function            PT Goals (current goals can now be found in the care plan section) Acute Rehab PT Goals Patient Stated Goal: per daughter, get back to watching grandkids and being active Time For Goal Achievement: 08/29/18 Potential to Achieve Goals: Fair Progress towards PT goals: Progressing toward goals    Frequency    Min 2X/week      PT Plan Current plan remains appropriate    Co-evaluation              AM-PAC PT "6 Clicks" Mobility   Outcome Measure  Help needed turning from your back to your side while in a flat bed without using bedrails?: Total Help needed moving from lying on your back to sitting on the side of a flat bed without using bedrails?: Total Help needed moving to and from a bed to a chair (including a wheelchair)?: Total Help needed standing up from a  chair using your arms (e.g., wheelchair or bedside chair)?: Total Help needed to walk in hospital room?: Total Help needed climbing 3-5 steps with a railing? : Total 6 Click Score: 6    End of Session   Activity Tolerance: Patient limited by lethargy Patient left: in bed Nurse Communication: Mobility status PT Visit Diagnosis: Other abnormalities of gait and mobility (R26.89);Muscle weakness (generalized) (M62.81)     Time: 1050-1058 PT Time Calculation (min) (ACUTE ONLY): 8 min  Charges:  $Therapeutic Exercise: 8-22 mins                     Maurice Patterson, PT, GCS 08/21/18,11:29 AM

## 2018-08-21 NOTE — Progress Notes (Signed)
Nutrition Follow-up  DOCUMENTATION CODES:   Not applicable  INTERVENTION:  Initiate new goal TF regimen of Glucerna 1.5 Cal at 55 mL/hr (1320 mL goal daily volume) per tube. Provides 1980 kcal, 109 grams of protein, 1003 mL H2O daily.  With current free water flush of 100 mL Q8hrs patient is receiving a total of 1303 mL H2O daily including water in tube feeding.  Recommend increasing to 180 mL Q4hrs for a total of 2083 mL H2O daily.  Goal TF meets 100% RDIs for vitamins/minerals.  NUTRITION DIAGNOSIS:   Inadequate oral intake related to (acute metabolic encephalopathy) as evidenced by meal completion < 25%.  Ongoing - addressing with TF regimen.  GOAL:   Provide needs based on ASPEN/SCCM guidelines  Met with TF regimen.  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   Significant events: -Patient was seen by this RD for initial assessment on 12/5. Received consult for TPN initiation. Patient was not an ideal candidate for TPN but patient's family was requesting nutrition be started and team felt he would not keep an NGT in place. TPN was not initiated as PICC line could not be placed. -On evening of 12/7patient developed progressive respiratory failure with presumptive aspiration. He transferred to ICU and was initially placed on BiPAP but later required intubation. -Patient was extubated on 12/11. -On 12/13 patient went to IR for post-pyloric feeding tube placement. Tip of tube was unable to be advanced into the duodenum so terminated in stomach. -Patient was re-intubated on 12/17 in setting  of respiratory failure and worsening unresponsiveness. -Extubated on 12/23.  Assessed patient today. No family members present at time of RD assessment. He is tolerating tube feeds well. Abdomen remains soft. Last BM a medium type 5 on 12/29. Patient is able to open eyes to commands but is not able to respond. Able to follow simple commands. Patient is working with PT but is mainly doing passive therapy preformed by PT. He did 2-4 reps independently today. In setting of hyperglycemia and increased insulin requirements, will try patient on Glucerna today to see if that will help with glycemic control.  Enteral Access: NGT placed 12/13 by IR; tip terminates in gastric fundus per CT abd/pelvis 12/20; 80 cm at right nare  TF: pt tolerating goal TF regimen of Osmolite 1.5 at 55 mL/hr + Pro-Stat 30 mL daily per tube; FWF 100 mL Q8hrs  Medications reviewed and include: Novolog 0-15 units Q4hrs (received 48 units past 24 hrs), Lantus 15 units daily, Miralax 17 grams daily, senna-docusate 1 tablet daily, thiamine 100 mg daily per tube.  Labs reviewed: CBG 254-315 past 24 hrs.  Discussed with RN and on rounds.  Diet Order:   Diet Order    None     EDUCATION NEEDS:   No education needs have been identified at this time  Skin:  Skin Assessment: Skin Integrity Issues:(DTI to sacrum; ecchymosis to bilateral arms)  Last BM:  08/20/2018 - medium type 5  Height:   Ht Readings from Last 1 Encounters:  07/29/18 '5\' 6"'$  (1.676 m)   Weight:   Wt Readings from Last 1 Encounters:  08/21/18 60.1 kg   Ideal Body Weight:  70 kg  BMI:  Body mass index is 21.39 kg/m.  Estimated Nutritional Needs:   Kcal:  1845-2150 (MSJ x 1.2-1.4)  Protein:  85-105 grams (1.2-1.5 grams/kg)  Fluid:  >1.9L/day   Willey Blade, MS, RD, LDN Office: [phone] Pager: [phone] After Hours/Weekend Pager: [phone]

## 2018-08-21 NOTE — Consult Note (Addendum)
Vonda Antigua, MD [address] [place], [place], [place], [address] [address], [place], [place], [place], [address] Phone: [phone]  Fax: [phone]  Consultation  Referring Provider:     Dr. Tressia Miners Primary Care Physician:  [name], MD Reason for Consultation:  PEG placement  Date of Admission:  07/22/2018 Date of Consultation:  08/21/2018         HPI:   Maurice Patterson is a 63 y.o. male in the ICU with acute hypoxic respiratory failure, currently extubated with GI consulted for PEG tube placement.  Family at bedside and states patient is very functional at baseline.  Is able to care for himself, and does his own laundry and is independent or interactive at baseline.    On admission it is noted that he had weakness and poor appetite, and encephalopathy.  In addition, he was treated for pneumonia and required intubation during this admission. Neurology was consulted and patient has been on Vimpat and Lamictal, with improvement in mental status but not close to baseline according to progress notes by primary team today.  Past Medical History:  Diagnosis Date  . Acute MI ([place])   . Arthritis   . Back pain   . CAD (coronary artery disease)   . CHF (congestive heart failure) ([place])   . Chronic kidney disease   . Degenerative lumbar disc   . Dementia ([place])   . Diabetes mellitus without complication ([place])   . GERD (gastroesophageal reflux disease)   . Hernia of abdominal cavity   . Hyperlipemia   . Hypertension   . Malignant intraductal papillary mucinous tumor of pancreas ([place])   . Memory loss   . Pancreatitis   . Seizures ([place])    staring spells  . Shingles   . Stroke ([place]) 08/2017  . TIA (transient ischemic attack)     Past Surgical History:  Procedure Laterality Date  . CARDIAC CATHETERIZATION    . CORONARY ANGIOPLASTY    . CYSTOSCOPY WITH STENT PLACEMENT Right 05/13/2017   Procedure: CYSTOSCOPY WITH STENT PLACEMENT;  Surgeon: Nickie Retort, MD;  Location: [facility] ORS;  Service: Urology;  Laterality: Right;  . ERCP N/A 09/12/2015   Procedure: ENDOSCOPIC RETROGRADE CHOLANGIOPANCREATOGRAPHY (ERCP);  Surgeon: Hulen Luster, MD;  Location: [facility] ENDOSCOPY;  Service: Gastroenterology;  Laterality: N/A;  . ERCP N/A 01/02/2016   Procedure: ENDOSCOPIC RETROGRADE CHOLANGIOPANCREATOGRAPHY (ERCP);  Surgeon: Hulen Luster, MD;  Location: [facility]COPY;  Service: Gastroenterology;  Laterality: N/A;  . EXTERNAL FIXATION WRIST FRACTURE Left   . left arm metal plate    . LEFT HEART CATH AND CORONARY ANGIOGRAPHY Left 11/04/2016   Procedure: Left Heart Cath and Coronary Angiography;  Surgeon: Isaias Cowman, MD;  Location: [facility] LAB;  Service: Cardiovascular;  Laterality: Left;  . Left shoulder surgery    . Stent times 3  2013   Cardiac  . TOTAL ELBOW REPLACEMENT Left   . VASECTOMY    . VENTRICULOPERITONEAL SHUNT      Prior to Admission medications   Medication Sig Start Date End Date Taking? Authorizing Provider  aspirin EC 81 MG tablet Take 81 mg by mouth daily.   Yes [provider]  botulinum toxin Type A (BOTOX) 100 units SOLR injection Inject 100 Units into the muscle every 3 (three) months.   Yes [provider]  cholecalciferol (VITAMIN D) 1000 units tablet Take 1,000 Units by mouth daily.    Yes [provider]  clopidogrel (PLAVIX)  75 MG tablet Take 75 mg by mouth daily.    Yes [provider]  colestipol (COLESTID) 1 g tablet Take 1 g by mouth daily.    Yes [provider]  diphenoxylate-atropine (LOMOTIL) 2.5-0.025 MG tablet Take 1 tablet by mouth 3 (three) times daily as needed for diarrhea or loose stools.   Yes [provider]  donepezil (ARICEPT) 10 MG tablet Take 1 tablet (10 mg total) by mouth 2 (two) times daily. 01/09/18  Yes Ward Givens, NP  furosemide (LASIX) 40 MG tablet Take 40 mg by mouth daily.    Yes [provider]  insulin NPH Human  (HUMULIN N,NOVOLIN N) 100 UNIT/ML injection Inject 0.25 mLs (25 Units total) into the skin 2 (two) times daily. Patient taking differently: Inject 32 Units into the skin 2 (two) times daily.  04/05/16  Yes Sudini, Alveta Heimlich, MD  insulin regular (NOVOLIN R,HUMULIN R) 100 units/mL injection Inject 0.3 mLs (30 Units total) into the skin 3 (three) times daily with meals. Patient taking differently: Inject 34 Units into the skin 3 (three) times daily before meals. 34 units subcutaneous 3 times daily with meals with an additional sliding scale of 8 units added with every 20 glucose reading over 200. 04/05/16  Yes Sudini, Alveta Heimlich, MD  isosorbide mononitrate (IMDUR) 30 MG 24 hr tablet Take 30 mg by mouth daily.  10/07/16  Yes [provider]  lidocaine (LIDODERM) 5 % Place 1 patch onto the skin daily as needed (pain). Remove & Discard patch within 12 hours or as directed by MD (APPLIED TO PATIENT BACK FOR PAIN)   Yes [provider]  losartan (COZAAR) 100 MG tablet Take 100 mg by mouth daily.   Yes [provider]  memantine (NAMENDA) 10 MG tablet Take 1 tablet (10 mg total) by mouth 2 (two) times daily. 01/09/18  Yes Ward Givens, NP  metoprolol succinate (TOPROL-XL) 50 MG 24 hr tablet Take 50 mg by mouth daily. Take with or immediately following a meal.    Yes [provider]  potassium chloride (K-DUR) 10 MEQ tablet Take 10 mEq by mouth 2 (two) times daily. 07/14/18  Yes [provider]  acetaminophen (TYLENOL) 500 MG tablet Take 500 mg by mouth 3 (three) times daily.    [provider]  lamoTRIgine (LAMICTAL) 100 MG tablet Take 100 mg by mouth 2 (two) times daily.    [provider]  nitroGLYCERIN (NITROSTAT) 0.4 MG SL tablet Place 1 tablet under the tongue every 5 (five) minutes as needed for chest pain.     [provider]  pantoprazole (PROTONIX) 40 MG tablet Take 40 mg by mouth daily.  10/04/16   [provider]    Family  History  Problem Relation Age of Onset  . Diabetes Mother   . Hypertension Mother   . CAD Mother   . Hyperlipidemia Mother   . Stroke Mother   . ALS Mother   . Alzheimer's disease Father   . Diabetes Father   . Heart Problems Brother      Social History   Tobacco Use  . Smoking status: Former Smoker    Last attempt to quit: 2013    Years since quitting: 6.9  . Smokeless tobacco: Never Used  . Tobacco comment: used to smoke 2PD for 40 yrs, quit about 4 years ago  Substance Use Topics  . Alcohol use: No    Alcohol/week: 0.0 standard drinks    Comment: occasional  . Drug use:  No    Allergies as of 07/22/2018 - Review Complete 07/22/2018  Allergen Reaction Noted  . Hydrocodone Anaphylaxis 02/19/2015  . Morphine Other (See Comments) 05/27/2015  . Ambien [zolpidem] Other (See Comments) 02/19/2015  . Brilinta [ticagrelor] Other (See Comments) 02/19/2015  . Flexeril [cyclobenzaprine] Other (See Comments) 12/19/2015  . Flunitrazepam Other (See Comments) 11/02/2016  . Haldol [haloperidol lactate] Other (See Comments) 12/19/2015  . Levetiracetam Diarrhea and Other (See Comments) 05/27/2015  . Lorazepam Hives 05/27/2015  . Risperdal [risperidone] Other (See Comments) 12/19/2015  . Trazodone Other (See Comments) 05/27/2015  . Benadryl [diphenhydramine hcl (sleep)] Rash 04/04/2016  . Penicillins Rash 02/19/2015    Review of Systems:    All systems reviewed and negative except where noted in HPI.   Physical Exam:  Vital signs in last 24 hours: Vitals:   08/21/18 0400 08/21/18 0500 08/21/18 0600 08/21/18 0708  BP: 111/79 135/77 (!) 142/82   Pulse: 85 88 84   Resp: 19 (!) 23 18   Temp: 98.1 F (36.7 C)     TempSrc: Axillary     SpO2: 94% 94% 96% 97%  Weight:      Height:       Last BM Date: 08/20/18 General:   Pleasant, cooperative in NAD Head:  Normocephalic and atraumatic. Eyes:   No icterus.   Conjunctiva pink. PERRLA. Ears:  Normal auditory acuity. Neck:  Supple;  no masses or thyroidomegaly Lungs: Respirations even and unlabored. Lungs clear to auscultation bilaterally.   No wheezes, crackles, or rhonchi.  Abdomen:  Soft, nondistended, nontender. Normal bowel sounds. No appreciable masses or hepatomegaly.  No rebound or guarding.  Neurologic:  Alert and oriented x3;  grossly normal neurologically. Skin:  Intact without significant lesions or rashes. Cervical Nodes:  No significant cervical adenopathy. Psych:  Alert and cooperative. Normal affect.  LAB RESULTS: Recent Labs    08/19/18 0442  WBC 6.7  HGB 12.4*  HCT 40.6  PLT 182   BMET Recent Labs    08/19/18 0442  NA 142  K 3.6  CL 108  CO2 25  GLUCOSE 240*  BUN 19  CREATININE 0.65  CALCIUM 8.3*   LFT No results for input(s): PROT, ALBUMIN, AST, ALT, ALKPHOS, BILITOT, BILIDIR, IBILI in the last 72 hours. PT/INR No results for input(s): LABPROT, INR in the last 72 hours.  STUDIES: No results found.    Impression / Plan:   BURDETTE FOREHAND is a 62 y.o. y/o male with pneumonia on last admission requiring intubation and extubation, with altered mental status from baseline, currently receiving Dobbhoff tube feeds  Prior to this hospital admission, patient was able to take care of himself at home according to his family. He has had a prolonged hospital course on this admission His mental status is improving as per hospital documentation but is not at his baseline  Prior to any PEG tube placement, we will need to ensure that a PEG tube is indicated. During acute hospitalizations and acute changes in mental status, patients usually able to swallow or eat back at their baseline after acute issues are addressed and patient is medically optimized.  Therefore, we will need to evaluate if patient improves after his acute medical conditions are treated.  In addition, he will need a swallow evaluation, which is also likely to change as his mental status improves, prior to any PEG tube  placement to see if it is indicated.  PEG tube placement entails risks of infection, perforation, and other risks.  Therefore, we will await to see how his medical condition changes on a daily basis prior to any endoscopic procedures.  He is receiving nutrition via Dobbhoff T and maintaining his intake in that fashion as of right now.  ADDENDUM: I talked to the patient's wife, Maurice Patterson, at length over the phone today.  She is absolutely refusing a PEG tube placement at this time.  She is not consenting for a PEG tube placement at this time.  Addendum: I have been contacted by the speech pathologist, Loni Beckwith, and also by nutritional management Willey Blade in regard to this patient and need for PEG tube placement.  I have informed them that the patient's wife who is the decision-maker, is absolutely refusing PEG tube placement and therefore we will not be able to proceed with PEG tube placement at this time.  Therefore, GI service will sign off as patient's family is refusing PEG tube placement.  If they agree PEG tube placement in the future and if it is considered appropriate, primary team can recall [place] at that time.  Thank you for involving me in the care of this patient.      LOS: 29 days   Virgel Manifold, MD  08/21/2018, 12:06 PM

## 2018-08-22 LAB — GLUCOSE, CAPILLARY
GLUCOSE-CAPILLARY: 172 mg/dL — AB (ref 70–99)
Glucose-Capillary: 179 mg/dL — ABNORMAL HIGH (ref 70–99)
Glucose-Capillary: 184 mg/dL — ABNORMAL HIGH (ref 70–99)
Glucose-Capillary: 186 mg/dL — ABNORMAL HIGH (ref 70–99)
Glucose-Capillary: 210 mg/dL — ABNORMAL HIGH (ref 70–99)
Glucose-Capillary: 215 mg/dL — ABNORMAL HIGH (ref 70–99)
Glucose-Capillary: 224 mg/dL — ABNORMAL HIGH (ref 70–99)

## 2018-08-22 LAB — CBC
HCT: 41.2 % (ref 39.0–52.0)
Hemoglobin: 12.9 g/dL — ABNORMAL LOW (ref 13.0–17.0)
MCH: 29.8 pg (ref 26.0–34.0)
MCHC: 31.3 g/dL (ref 30.0–36.0)
MCV: 95.2 fL (ref 80.0–100.0)
Platelets: 213 10*3/uL (ref 150–400)
RBC: 4.33 MIL/uL (ref 4.22–5.81)
RDW: 16.9 % — AB (ref 11.5–15.5)
WBC: 7.2 10*3/uL (ref 4.0–10.5)
nRBC: 0 % (ref 0.0–0.2)

## 2018-08-22 LAB — COMPREHENSIVE METABOLIC PANEL
ALT: 804 U/L — ABNORMAL HIGH (ref 0–44)
ANION GAP: 9 (ref 5–15)
AST: 861 U/L — ABNORMAL HIGH (ref 15–41)
Albumin: 3.1 g/dL — ABNORMAL LOW (ref 3.5–5.0)
Alkaline Phosphatase: 191 U/L — ABNORMAL HIGH (ref 38–126)
BUN: 19 mg/dL (ref 8–23)
CO2: 27 mmol/L (ref 22–32)
Calcium: 8.8 mg/dL — ABNORMAL LOW (ref 8.9–10.3)
Chloride: 110 mmol/L (ref 98–111)
Creatinine, Ser: 0.7 mg/dL (ref 0.61–1.24)
GFR calc Af Amer: >60 mL/min (ref >60)
GFR calc non Af Amer: >60 mL/min (ref >60)
Glucose, Bld: 182 mg/dL — ABNORMAL HIGH (ref 70–99)
POTASSIUM: 3.5 mmol/L (ref 3.5–5.1)
SODIUM: 146 mmol/L — AB (ref 135–145)
Total Bilirubin: 0.6 mg/dL (ref 0.3–1.2)
Total Protein: 6.4 g/dL — ABNORMAL LOW (ref 6.5–8.1)

## 2018-08-22 LAB — AMMONIA: Ammonia: 38 umol/L — ABNORMAL HIGH (ref 9–35)

## 2018-08-22 NOTE — Progress Notes (Signed)
Physical Therapy Treatment Patient Details Name: Maurice Patterson MRN: [id number] DOB: Aug 01, 1956 Today's Date: 08/22/2018    History of Present Illness 62 y/o male here with pneumonia and respiratory distress has intubated (on and off) and ultimately extubated 12/23.      PT Comments    Patient restless in the bed this am. Opens eyes to his name, no response this day to commands. Performed LE strengthening/ROM exercises to include: ap, LAQ, heel slides, hip abd/add. Patient not prgressing toward goals due to cognitive state.    Follow Up Recommendations  SNF     Equipment Recommendations  None recommended by PT    Recommendations for Other Services       Precautions / Restrictions Precautions Precautions: Fall Restrictions Weight Bearing Restrictions: No    Mobility  Bed Mobility Overal bed mobility: Needs Assistance                Transfers                 General transfer comment: patient unable to follow direction/commands effectively to participate in therapy   Ambulation/Gait             General Gait Details: not able at this time   Stairs             Wheelchair Mobility    Modified Rankin (Stroke Patients Only)       Balance                                            Cognition Arousal/Alertness: Lethargic Behavior During Therapy: Restless Overall Cognitive Status: Impaired/Different from baseline Area of Impairment: Orientation;Attention;Following commands;Safety/judgement;Awareness                 Orientation Level: Disoriented to;Place;Time;Situation     Following Commands: Follows one step commands inconsistently Safety/Judgement: Decreased awareness of safety;Decreased awareness of deficits     General Comments: briefly opens eyes but falls quickly back to sleep.      Exercises Other Exercises Other Exercises: supine PROM BLE : AP, Heel slides, Hip abd/add, SAQ x 10 reps each. No  active participation this visit.     General Comments        Pertinent Vitals/Pain Pain Assessment: No/denies pain    Home Living                      Prior Function            PT Goals (current goals can now be found in the care plan section) Acute Rehab PT Goals Patient Stated Goal: per daughter, get back to watching grandkids and being active PT Goal Formulation: With family Time For Goal Achievement: 08/29/18 Potential to Achieve Goals: Poor Progress towards PT goals: Not progressing toward goals - comment    Frequency    Min 2X/week      PT Plan Current plan remains appropriate    Co-evaluation              AM-PAC PT "6 Clicks" Mobility   Outcome Measure  Help needed turning from your back to your side while in a flat bed without using bedrails?: Total Help needed moving from lying on your back to sitting on the side of a flat bed without using bedrails?: Total Help needed moving to and from a bed to a chair (  including a wheelchair)?: Total Help needed standing up from a chair using your arms (e.g., wheelchair or bedside chair)?: Total Help needed to walk in hospital room?: Total Help needed climbing 3-5 steps with a railing? : Total 6 Click Score: 6    End of Session   Activity Tolerance: Patient limited by lethargy Patient left: in bed Nurse Communication: Mobility status PT Visit Diagnosis: Other abnormalities of gait and mobility (R26.89);Muscle weakness (generalized) (M62.81)     Time: 1020-1030 PT Time Calculation (min) (ACUTE ONLY): 10 min  Charges:  $Therapeutic Exercise: 8-22 mins                     Denorris Reust, PT, GCS 08/22/18,12:00 PM

## 2018-08-22 NOTE — Progress Notes (Signed)
[facility] [place] NAME: Maurice Patterson    MR#:  [id number]  DATE OF BIRTH:  April 26, 1956  SUBJECTIVE:  CHIEF COMPLAINT:  No chief complaint on file.  -same as yesterday-opening eyes to name and occasionally follows some simple commands.  Still remains pretty lethargic -getting tube feeds  REVIEW OF SYSTEMS:  Review of Systems  Unable to perform ROS: Medical condition    DRUG ALLERGIES:   Allergies  Allergen Reactions  . Hydrocodone Anaphylaxis  . Morphine Other (See Comments)    Loss of memory  . Ambien [Zolpidem] Other (See Comments)    delirium    . Brilinta [Ticagrelor] Other (See Comments)    Stroke   . Flexeril [Cyclobenzaprine] Other (See Comments)    delerium   . Flunitrazepam Other (See Comments)    ROHYPNOL (hallucinations)  . Haldol [Haloperidol Lactate] Other (See Comments)    delerium   . Levetiracetam Diarrhea and Other (See Comments)    Unable to walk  . Lorazepam Hives  . Risperdal [Risperidone] Other (See Comments)    Delirium   . Trazodone Other (See Comments)    Delirium Can take in low doses   . Benadryl [Diphenhydramine Hcl (Sleep)] Rash  . Penicillins Rash    Mouth ulcers Has patient had a PCN reaction causing immediate rash, facial/tongue/throat swelling, SOB or lightheadedness with hypotension: Yes Has patient had a PCN reaction causing severe rash involving mucus membranes or skin necrosis: No Has patient had a PCN reaction that required hospitalization No Has patient had a PCN reaction occurring within the last 10 years: Yes If all of the above answers are "NO", then may proceed with Cephalosporin use.    VITALS:  Blood pressure 116/72, pulse 83, temperature 97.8 F (36.6 C), temperature source Axillary, resp. rate 19, height 5\' 6"  (1.676 m), weight 62.6 kg, SpO2 95 %.  PHYSICAL EXAMINATION:  Physical Exam   GENERAL:  62 y.o.-year-old patient lying in the bed with no acute distress.   EYES: Pupils equal, round, reactive to light and accommodation. No scleral icterus. Extraocular muscles intact.  HEENT: Head atraumatic, normocephalic. Oropharynx and nasopharynx clear.  NECK:  Supple, no jugular venous distention. No thyroid enlargement, no tenderness.  LUNGS: Normal breath sounds bilaterally, no wheezing, rales,rhonchi or crepitation. No use of accessory muscles of respiration.  Decreased bibasilar breath sounds CARDIOVASCULAR: S1, S2 normal. No murmurs, rubs, or gallops.  ABDOMEN: Soft, nontender, nondistended. Bowel sounds present. No organomegaly or mass.  EXTREMITIES: No pedal edema, cyanosis, or clubbing.  NEUROLOGIC: Cranial nerves II through XII are intact.  No facial droop noted.  Both hands in mittens.  Nonverbal.  Tracking.  Nodding head to some questions and following simple commands.   Sensation intact. Gait not checked.  PSYCHIATRIC: The patient is alert and tracking, following simple commands.  Not very interactive.   SKIN: No obvious rash, lesion, or ulcer.    LABORATORY PANEL:   CBC Recent Labs  Lab 08/22/18 0316  WBC 7.2  HGB 12.9*  HCT 41.2  PLT 213   ------------------------------------------------------------------------------------------------------------------  Chemistries  Recent Labs  Lab 08/19/18 0442 08/22/18 0316  NA 142 146*  K 3.6 3.5  CL 108 110  CO2 25 27  GLUCOSE 240* 182*  BUN 19 19  CREATININE 0.65 0.70  CALCIUM 8.3* 8.8*  MG 2.2  --   AST  --  861*  ALT  --  804*  ALKPHOS  --  191*  BILITOT  --  0.6   ------------------------------------------------------------------------------------------------------------------  Cardiac Enzymes No results for input(s): TROPONINI in the last 168 hours. ------------------------------------------------------------------------------------------------------------------  RADIOLOGY:  No results found.  EKG:   Orders placed or performed during the hospital encounter of 07/22/18    . EKG 12-Lead  . EKG 12-Lead  . EKG 12-Lead  . EKG 12-Lead  . ED EKG  . ED EKG    ASSESSMENT AND PLAN:   62 year old male with past medical history significant for CAD, CHF, CKD, dementia, diabetes, hypertension and seizure disorder presents to hospital secondary to worsening weakness and poor appetite.  1.  Acute encephalopathy-unclear etiology at this time. -Concern for worsening dementia -Patient had MRI with and without contrast on admission that did not show anything acute, initial EEG concern for generalized slowing. -Metabolic encephalopathy.  Lumbar puncture also was done and negative for any Gram stain organisms. -Patient was treated empirically for possible herpes encephalitis -Mentation is slightly improved that patient is alert and following some simple commands.  Still not close to baseline according to wife. -Appreciate neurology consult.  Vimpat and Lamictal , Seroquel and Provigil-all discontinued now.  2.  Acute hypoxic respiratory failure-patient admitted to the hospital on 07/23/2018, intubated on 07/29/2018 initially, was reintubated again on 08/07/2018. -Aspiration pneumonia and suspected.  Currently extubated.  Finished IV antibiotics -Continue tube feeds.  Family refusing PEG tube. -We will need modified barium swallow study when more alert  3.  Hypokalemia and hyponatremia.  Improved after replacements  4.  Dementia-continue Namenda, aricept.  But independent and interactive at baseline according to wife  5.  Diabetes mellitus-on Lantus and sliding scale insulin  6.    DVT prophylaxis-on Lovenox  7.  Nutrition-still has a Dobbhoff tube and tube feeds  Physical therapy consulted.  Likely will need placement- LTAC if still on tube feeds   All the records are reviewed and case discussed with Care Management/Social Workerr. Management plans discussed with the patient, family and they are in agreement.  CODE STATUS: Full code  TOTAL TIME TAKING CARE OF  THIS PATIENT: 28 minutes.   POSSIBLE D/C IN 2-3 DAYS, DEPENDING ON CLINICAL CONDITION.   Gladstone Lighter M.D on 08/22/2018 at 11:40 AM  Between 7am to 6pm - Pager - [phone]  After 6pm go to [email or web address] - password [email or web address] [facility]  Office  [phone]  CC: Primary care physician; [name], MD

## 2018-08-22 NOTE — Progress Notes (Signed)
Speech Language Pathology Treatment: Dysphagia  Patient Details Name: Maurice Patterson MRN: [id number] DOB: [birth year] Today's Date: 08/22/2018 Time: 7628-3151 SLP Time Calculation (min) (ACUTE ONLY): 30 min  Assessment / Plan / Recommendation Clinical Impression  Patient observed lying reclined in bed with eyes open and open mouth posture. Patient on room air with vitals WNL. Patient demonstrated appropriate eye contact with SLP given verbal/visual cues, intermittently responding appropriately to y/n and wh-ques however somewhat distractible. Oral care provided by SLP with toothbrush/toothpaste/[place]unker. Of note, moderate thick secretions on roof of mouth - NSG notified. Given max feeding assistance, patient consumed ice chips, small bites of puree, tsp thin liquids, and cup sips of thin liquids demonstrating mild anterior oral spillage with cup sips of thin liquids, potential oral holding with puree (however, benefited from verbal cues to "swallow hard"), and x1 delayed cough after PO intake (however possibly unrelated to penetration/aspiration). D/t patient's increase alertness, improvement in respiratory system, and adequate oral acceptance of PO intake today, SLP to recommend Dysphagia 1 (puree) diet and Thin liquids (cup only). SLP notified NSG re: recommendations including continued oral care. SLP notified spouse via telephone - she verbalized understanding and agreement to recommended diet. Edu provided to spouse re: aspiration precautions posted in room (I.e. small bites/sips, slow rate, oral care before/after PO intake, rest breaks, upright posture). She verbalized understanding and agreement. Will f/u with MBS to determine least restrictive diet and maximize safe PO intake.    HPI HPI: Maurice Patterson is a 62 y.o male with past medical history of Alzheimer's Dementia, CAD, hypertension, seizure disorder on Lamictal 100 mg twice daily, chronic pancreatitis, herpes simplex encephalitis, diabetes, CVA, MI,  hyperlipidemia, and Alzheimer's type dementia presenting to the ED 07/22/2018 with 2 days of progressive lethargy and weakness.  Patient is altered and unable to provide history so most of the history obtained from patient's chart.  Per ED reports patient's wife report that prior to this episode patient has been able to care for himself and ambulate around the house without assistance.  However patient became increasingly lethargic, unable to walk or care for himself which is not normal for him.  She was concerned that patient might have had a stroke therefore brought him to the ED. On arrival to the ED, initial CT scan showed no acute intracranial abnormality.  Labs revealed slightly elevated ammonia of 38, potassium of 2.8, normal white count blood cultures pending.  X-ray showed suspected subtle infiltrate in the right upper lung and left base.  He had a follow-up MRI of the brain which did not show acute intracranial abnormality.  Patient has been evaluated in the past (2017) with similar presentation at [facility]. He had extensive work up including LP done on 11/03/2015 with reassuring cell count and negative CSF HSV, VZV, and culture. Maurice Patterson's presentation has been felt to be metabolic in nature, neurology was consulted, negative work-up to include lumbar puncture and brain MRI.  Due to Maurice Patterson's declining respiratory status during admission, he has been placed on BiPAP then orally intubated ~12/8-12/11.  Maurice Patterson is now extubated but continues to have thick sounding secretions during an involuntary cough.  He has a declined Cognitive status w/ reduced alertness to task of po's;Patient with increasing alertness since evaluation on 12/27. Now on room air, continues to TF via NG tube.     SLP Plan  Continue with current plan of care       Recommendations  Diet recommendations: Dysphagia 1 (puree);Thin liquid (cup only) Liquids provided via: Straw  Medication Administration: Other (Comment)(Per nursing  discretion) Supervision: Full supervision/cueing for compensatory strategies Compensations: Minimize environmental distractions;Slow rate;Small sips/bites;Monitor for anterior loss;Follow solids with liquid Postural Changes and/or Swallow Maneuvers: Seated upright 90 degrees                Oral Care Recommendations: Oral care before and after PO SLP Visit Diagnosis: Dysphagia, oropharyngeal phase (R13.12) Plan: Continue with current plan of care       Loni Beckwith, M.S. CCC-SLP Speech-Language Pathologist                 Loni Beckwith 08/22/2018, 3:27 PM

## 2018-08-23 LAB — HEPATIC FUNCTION PANEL
ALT: 1575 U/L — ABNORMAL HIGH (ref 0–44)
AST: 1064 U/L — ABNORMAL HIGH (ref 15–41)
Albumin: 3.1 g/dL — ABNORMAL LOW (ref 3.5–5.0)
Alkaline Phosphatase: 255 U/L — ABNORMAL HIGH (ref 38–126)
Bilirubin, Direct: <0.1 mg/dL (ref 0.0–0.2)
Total Bilirubin: 0.6 mg/dL (ref 0.3–1.2)
Total Protein: 6.4 g/dL — ABNORMAL LOW (ref 6.5–8.1)

## 2018-08-23 LAB — AMMONIA: AMMONIA: 34 umol/L (ref 9–35)

## 2018-08-23 LAB — GLUCOSE, CAPILLARY
GLUCOSE-CAPILLARY: 168 mg/dL — AB (ref 70–99)
Glucose-Capillary: 187 mg/dL — ABNORMAL HIGH (ref 70–99)
Glucose-Capillary: 176 mg/dL — ABNORMAL HIGH (ref 70–99)
Glucose-Capillary: 157 mg/dL — ABNORMAL HIGH (ref 70–99)
Glucose-Capillary: 250 mg/dL — ABNORMAL HIGH (ref 70–99)

## 2018-08-23 NOTE — Progress Notes (Signed)
Patient's wife requested for medical team to be informed that she does not wish for patient to have a feeding tube if needed later in care since patient is able to eat at this time. Patient was able eat half about 50% of meal and drank 336 mL of liquids. Yevonne Aline, NP aware. Patient is alert and will answer questions at this time.  Wilnette Kales

## 2018-08-23 NOTE — Progress Notes (Signed)
Family Meeting Note  Advance Directive:yes  Today a meeting took place with the spouse.  Patient is unable to participate due SB:[id number] capacity dementia   The following clinical team members were present during this meeting:MD  The following were discussed:Patient's diagnosis:dementia , Patient's progosis: Unable to determine and Goals for treatment: Full Code  Additional follow-up to be provided: prn  Time spent during discussion:20 minutes  Gorden Harms, MD

## 2018-08-23 NOTE — Progress Notes (Signed)
Pt has waxed and waned mentally all day. One moment he is A+ox 3, then he becomes confused and restless in bed, with mumbling speech that is difficult to understand. Tube feeds continue, attempted to keep pt sitting up in bed, but he does not stay still . I tried to feed him breakfast and stopped after 1 1/2 teaspoon and sip as he began coughing. No lunch was attempted as discusions re NPO for test. Decided to be NPO after Midnight tonight. Wife called, and was upset that she had apparently been told that we were trying to feed him steak and potatoes. RN reassured her re dysphagia diet and explained course of day. Wife restated that she does not want password shared with anyone else, and that she is very frustrated and in now way willl she consider a trach or PEG tube at this time. RN suggested family meeting to clarify issues, wife stated that these were her decisions to make and she would be making them

## 2018-08-23 NOTE — Progress Notes (Signed)
[facility] [place] NAME: Maurice Patterson    MR#:  [id number]  DATE OF BIRTH:  [birth year]  SUBJECTIVE:  Case discussed with the nursing staff, the patient's sister as well as the patient's wife, patient with extreme problems swallowing-coughing/slight choking with breakfast this morning per nursing staff, case discussed with wife-she is interested in feeding tube at this time, will reconsult gastroenterology for PEG tube placement as patient most likely unable to maintain body weight due to dysphagia  REVIEW OF SYSTEMS:  Review of Systems  Unable to perform ROS: Medical condition    DRUG ALLERGIES:   Allergies  Allergen Reactions  . Hydrocodone Anaphylaxis  . Morphine Other (See Comments)    Loss of memory  . Ambien [Zolpidem] Other (See Comments)    delirium    . Brilinta [Ticagrelor] Other (See Comments)    Stroke   . Flexeril [Cyclobenzaprine] Other (See Comments)    delerium   . Flunitrazepam Other (See Comments)    ROHYPNOL (hallucinations)  . Haldol [Haloperidol Lactate] Other (See Comments)    delerium   . Levetiracetam Diarrhea and Other (See Comments)    Unable to walk  . Lorazepam Hives  . Risperdal [Risperidone] Other (See Comments)    Delirium   . Trazodone Other (See Comments)    Delirium Can take in low doses   . Benadryl [Diphenhydramine Hcl (Sleep)] Rash  . Penicillins Rash    Mouth ulcers Has patient had a PCN reaction causing immediate rash, facial/tongue/throat swelling, SOB or lightheadedness with hypotension: Yes Has patient had a PCN reaction causing severe rash involving mucus membranes or skin necrosis: No Has patient had a PCN reaction that required hospitalization No Has patient had a PCN reaction occurring within the last 10 years: Yes If all of the above answers are "NO", then may proceed with Cephalosporin use.    VITALS:  Blood pressure 112/79, pulse 82, temperature (!) 97.5 F (36.4  C), temperature source Axillary, resp. rate 16, height 5\' 6"  (1.676 m), weight 63.4 kg, SpO2 99 %.  PHYSICAL EXAMINATION:  Physical Exam   GENERAL:  63 y.o.-year-old patient lying in the bed with no acute distress.  EYES: Pupils equal, round, reactive to light and accommodation. No scleral icterus. Extraocular muscles intact.  HEENT: Head atraumatic, normocephalic. Oropharynx and nasopharynx clear.  NECK:  Supple, no jugular venous distention. No thyroid enlargement, no tenderness.  LUNGS: Normal breath sounds bilaterally, no wheezing, rales,rhonchi or crepitation. No use of accessory muscles of respiration.  Decreased bibasilar breath sounds CARDIOVASCULAR: S1, S2 normal. No murmurs, rubs, or gallops.  ABDOMEN: Soft, nontender, nondistended. Bowel sounds present. No organomegaly or mass.  EXTREMITIES: No pedal edema, cyanosis, or clubbing.  NEUROLOGIC: Cranial nerves II through XII are intact.  No facial droop noted.  Both hands in mittens.  Nonverbal.  Tracking.  Nodding head to some questions and following simple commands.   Sensation intact. Gait not checked.  PSYCHIATRIC: The patient is alert and tracking, following simple commands.  Not very interactive.   SKIN: No obvious rash, lesion, or ulcer.    LABORATORY PANEL:   CBC Recent Labs  Lab 08/22/18 0316  WBC 7.2  HGB 12.9*  HCT 41.2  PLT 213   ------------------------------------------------------------------------------------------------------------------  Chemistries  Recent Labs  Lab 08/19/18 0442  08/22/18 0316 08/23/18 1018  NA 142  --  146*  --   K 3.6  --  3.5  --   CL 108  --  110  --   CO2 25  --  27  --   GLUCOSE 240*  --  182*  --   BUN 19  --  19  --   CREATININE 0.65  --  0.70  --   CALCIUM 8.3*  --  8.8*  --   MG 2.2  --   --   --   AST  --    < > 861* 1,064*  ALT  --    < > 804* 1,575*  ALKPHOS  --    < > 191* 255*  BILITOT  --    < > 0.6 0.6   < > = values in this interval not displayed.    ------------------------------------------------------------------------------------------------------------------  Cardiac Enzymes No results for input(s): TROPONINI in the last 168 hours. ------------------------------------------------------------------------------------------------------------------  RADIOLOGY:  No results found.  EKG:   Orders placed or performed during the hospital encounter of 07/22/18  . EKG 12-Lead  . EKG 12-Lead  . EKG 12-Lead  . EKG 12-Lead  . ED EKG  . ED EKG    ASSESSMENT AND PLAN:   63 year old male with past medical history significant for CAD, CHF, CKD, dementia, diabetes, hypertension and seizure disorder presents to hospital secondary to worsening weakness and poor appetite.  *Acute encephalopathy unclear etiology  - ?  Secondary to medication side effect versus worsening dementia versus possible herpes encephalitis MRI brain negative for acute process, EEG concern for generalized slowing, LP done/negative for any Gram stain organisms, neurology following, psychotropic meds discontinued, continue neurochecks per routine, aspiration/fall/skin care precautions while in house, check ammonia level, RPR  *Acute worsening transaminitis CMP daily, check liver ultrasound for further evaluation, check ammonia level, hepatitis panel, avoid hepatotoxic agents, and continue close medical monitoring  *Acute hypoxic respiratory failure Resolving Secondary to aspiration pneumonia-treated with course of IV antibiotics patient admitted to the hospital on 07/23/2018, intubated on 07/29/2018 initially, was reintubated again on 08/07/2018. Continue tube feeds for now, discuss plan of care with wife today-the wife is interested in feeding tube at this time given continue dysphagia/inability to maintain body weight orally  * Hypokalemia and hyponatremia Repleted  *Chronic dementia continue Namenda, aricept independent and interactive at baseline according to  wife  Chronic diabetes mellitus Stable on current regiment  DVT prophylaxis-on Lovenox  Physical therapy consulted-for skilled nursing facility once stable from medical standpoint  All the records are reviewed and case discussed with Care Management/Social Workerr. Management plans discussed with the patient, family and they are in agreement.  CODE STATUS: Full code  TOTAL TIME TAKING CARE OF THIS PATIENT: 40 minutes.   POSSIBLE D/C IN 2-3 DAYS, DEPENDING ON CLINICAL CONDITION.   Avel Peace Salary M.D on 08/23/2018 at 12:21 PM  Between 7am to 6pm - Pager - [phone]  After 6pm go to [email or web address] - password [email or web address] [facility]  Office  [phone]  CC: Primary care physician; [name], MD

## 2018-08-23 NOTE — Progress Notes (Signed)
Wife is hear, intimally upset re diet, RN read her the note from speech therapist and showed her the food. Pt is more awake, sat up in bed and given small bites of mashed potato and gravy and purred carrots. Ate about 10 bites with intermittent coughing, but much improved from breakfast time. Drank about 100 mls of thin liquids in very small sips.

## 2018-08-24 ENCOUNTER — Inpatient Hospital Stay: Payer: PPO

## 2018-08-24 LAB — COMPREHENSIVE METABOLIC PANEL
ALT: 1121 U/L — ABNORMAL HIGH (ref 0–44)
AST: 380 U/L — ABNORMAL HIGH (ref 15–41)
Albumin: 3.1 g/dL — ABNORMAL LOW (ref 3.5–5.0)
Alkaline Phosphatase: 239 U/L — ABNORMAL HIGH (ref 38–126)
Anion gap: 7 (ref 5–15)
BUN: 16 mg/dL (ref 8–23)
CO2: 26 mmol/L (ref 22–32)
Calcium: 8.4 mg/dL — ABNORMAL LOW (ref 8.9–10.3)
Chloride: 107 mmol/L (ref 98–111)
Creatinine, Ser: 0.59 mg/dL — ABNORMAL LOW (ref 0.61–1.24)
GFR calc non Af Amer: >60 mL/min (ref >60)
Glucose, Bld: 138 mg/dL — ABNORMAL HIGH (ref 70–99)
Potassium: 3.7 mmol/L (ref 3.5–5.1)
Sodium: 140 mmol/L (ref 135–145)
Total Bilirubin: 0.6 mg/dL (ref 0.3–1.2)
Total Protein: 6 g/dL — ABNORMAL LOW (ref 6.5–8.1)

## 2018-08-24 LAB — CBC
HCT: 41.6 % (ref 39.0–52.0)
Hemoglobin: 13.1 g/dL (ref 13.0–17.0)
MCH: 29.8 pg (ref 26.0–34.0)
MCHC: 31.5 g/dL (ref 30.0–36.0)
MCV: 94.5 fL (ref 80.0–100.0)
NRBC: 0 % (ref 0.0–0.2)
Platelets: 189 10*3/uL (ref 150–400)
RBC: 4.4 MIL/uL (ref 4.22–5.81)
RDW: 17.1 % — ABNORMAL HIGH (ref 11.5–15.5)
WBC: 6.5 10*3/uL (ref 4.0–10.5)

## 2018-08-24 LAB — GLUCOSE, CAPILLARY
GLUCOSE-CAPILLARY: 128 mg/dL — AB (ref 70–99)
Glucose-Capillary: 126 mg/dL — ABNORMAL HIGH (ref 70–99)
Glucose-Capillary: 147 mg/dL — ABNORMAL HIGH (ref 70–99)
Glucose-Capillary: 155 mg/dL — ABNORMAL HIGH (ref 70–99)
Glucose-Capillary: 190 mg/dL — ABNORMAL HIGH (ref 70–99)
Glucose-Capillary: 92 mg/dL (ref 70–99)

## 2018-08-24 MED ORDER — GLUCERNA 1.5 CAL PO LIQD
1000.0000 mL | ORAL | Status: DC
  Administered 2018-08-24: 1000 mL

## 2018-08-24 NOTE — Progress Notes (Signed)
[facility] NAME: Maurice Patterson    MR#:  [id number]  DATE OF BIRTH:  October 20, 1955  SUBJECTIVE:  Case discussed with the nursing staff, patient remains more encephalopathic during the day than, did eat a little bit during dinner yesterday, did discuss case with the patient's wife-he was reiterated to her that PEG tube is still recommended given patient's inability to maintain body weight/hydrate himself sufficiently with just p.o. intake, wife stated that she would like to try continue feedings for the next couple days before doing a PEG tube  REVIEW OF SYSTEMS:  Review of Systems  Unable to perform ROS: Medical condition    DRUG ALLERGIES:   Allergies  Allergen Reactions  . Hydrocodone Anaphylaxis  . Morphine Other (See Comments)    Loss of memory  . Ambien [Zolpidem] Other (See Comments)    delirium    . Brilinta [Ticagrelor] Other (See Comments)    Stroke   . Flexeril [Cyclobenzaprine] Other (See Comments)    delerium   . Flunitrazepam Other (See Comments)    ROHYPNOL (hallucinations)  . Haldol [Haloperidol Lactate] Other (See Comments)    delerium   . Levetiracetam Diarrhea and Other (See Comments)    Unable to walk  . Lorazepam Hives  . Risperdal [Risperidone] Other (See Comments)    Delirium   . Trazodone Other (See Comments)    Delirium Can take in low doses   . Benadryl [Diphenhydramine Hcl (Sleep)] Rash  . Penicillins Rash    Mouth ulcers Has patient had a PCN reaction causing immediate rash, facial/tongue/throat swelling, SOB or lightheadedness with hypotension: Yes Has patient had a PCN reaction causing severe rash involving mucus membranes or skin necrosis: No Has patient had a PCN reaction that required hospitalization No Has patient had a PCN reaction occurring within the last 10 years: Yes If all of the above answers are "NO", then may proceed with Cephalosporin use.    VITALS:  Blood pressure  113/83, pulse 94, temperature 98.5 F (36.9 C), temperature source Oral, resp. rate 18, height 5\' 6"  (1.676 m), weight 61.9 kg, SpO2 96 %.  PHYSICAL EXAMINATION:  Physical Exam   GENERAL:  63 y.o.-year-old patient lying in the bed with no acute distress.  EYES: Pupils equal, round, reactive to light and accommodation. No scleral icterus. Extraocular muscles intact.  HEENT: Head atraumatic, normocephalic. Oropharynx and nasopharynx clear.  NECK:  Supple, no jugular venous distention. No thyroid enlargement, no tenderness.  LUNGS: Normal breath sounds bilaterally, no wheezing, rales,rhonchi or crepitation. No use of accessory muscles of respiration.  Decreased bibasilar breath sounds CARDIOVASCULAR: S1, S2 normal. No murmurs, rubs, or gallops.  ABDOMEN: Soft, nontender, nondistended. Bowel sounds present. No organomegaly or mass.  EXTREMITIES: No pedal edema, cyanosis, or clubbing.  NEUROLOGIC: Cranial nerves II through XII are intact.  No facial droop noted.  Both hands in mittens.  Nonverbal.  Tracking.  Nodding head to some questions and following simple commands.   Sensation intact. Gait not checked.  PSYCHIATRIC: The patient is alert and tracking, following simple commands.  Not very interactive.   SKIN: No obvious rash, lesion, or ulcer.    LABORATORY PANEL:   CBC Recent Labs  Lab 08/24/18 0311  WBC 6.5  HGB 13.1  HCT 41.6  PLT 189   ------------------------------------------------------------------------------------------------------------------  Chemistries  Recent Labs  Lab 08/19/18 0442  08/24/18 0311  NA 142   < > 140  K 3.6   < >  3.7  CL 108   < > 107  CO2 25   < > 26  GLUCOSE 240*   < > 138*  BUN 19   < > 16  CREATININE 0.65   < > 0.59*  CALCIUM 8.3*   < > 8.4*  MG 2.2  --   --   AST  --    < > 380*  ALT  --    < > 1,121*  ALKPHOS  --    < > 239*  BILITOT  --    < > 0.6   < > = values in this interval not displayed.    ------------------------------------------------------------------------------------------------------------------  Cardiac Enzymes No results for input(s): TROPONINI in the last 168 hours. ------------------------------------------------------------------------------------------------------------------  RADIOLOGY:  US Abdomen Limited Ruq  Result Date: 08/24/2018 CLINICAL DATA:  History of hepatitis EXAM: ULTRASOUND ABDOMEN LIMITED RIGHT UPPER QUADRANT COMPARISON:  Abdominal and pelvic CT scan of August 11, 2018 FINDINGS: The study is limited due to the patient's clinical status. Gallbladder: No gallstones or wall thickening visualized. No sonographic Murphy sign noted by sonographer. Common bile duct: Diameter: 1.9 mm Liver: No focal lesion identified. Within normal limits in parenchymal echogenicity. No intrahepatic ductal dilation. The surface contour is smooth. Portal vein is patent on color Doppler imaging with normal direction of blood flow towards the liver. IMPRESSION: Normal limited right upper quadrant abdominal ultrasound examination. Electronically Signed   By: David  Martinique M.D.   On: 08/24/2018 09:33    EKG:   Orders placed or performed during the hospital encounter of 07/22/18  . EKG 12-Lead  . EKG 12-Lead  . EKG 12-Lead  . EKG 12-Lead  . ED EKG  . ED EKG    ASSESSMENT AND PLAN:   63 year old male with past medical history significant for CAD, CHF, CKD, dementia, diabetes, hypertension and seizure disorder presents to hospital secondary to worsening weakness and poor appetite.  *Acute encephalopathy Stable unclear etiology  - ?  Secondary to medication side effect versus worsening dementia versus possible herpes encephalitis MRI brain negative for acute process, EEG concern for generalized slowing, LP done/negative for any Gram stain organisms, neurology following, psychotropic meds discontinued, continue neurochecks per routine, aspiration/fall/skin care precautions  while in house, ammonia level normal, RPR negative  *Acute worsening transaminitis Improving CMP daily, liver ultrasound unimpressive, ammonia level normal, follow-up on hepatitis panel, gastroenterology to see, avoid hepatotoxic agents, and continue close medical monitoring  *Acute hypoxic respiratory failure Stable Secondary to aspiration pneumonia-treated with course of IV antibiotics patient admitted to the hospital on 07/23/2018, intubated on 07/29/2018 initially, was reintubated again on 08/07/2018. Continue tube feeds for now, d/w with wife today-the wife would like to try to see him eat for a few more days before definitively doing a PEG tube, it was reiterated to the patient's wife that patient needs a feeding tube as patient does not take in enough calories to maintain body weight or enough fluid to maintain hydration   * Hypokalemia and hyponatremia Repleted  *Chronic dementia continue Namenda, aricept independent and interactive at baseline according to wife  Chronic diabetes mellitus Stable on current regiment  DVT prophylaxis-on Lovenox  Physical therapy consulted-for skilled nursing facility once stable from medical standpoint  All the records are reviewed and case discussed with Care Management/Social Workerr. Management plans discussed with the patient, family and they are in agreement.  CODE STATUS: Full code  TOTAL TIME TAKING CARE OF THIS PATIENT: 40 minutes.   POSSIBLE D/C IN 2-3 DAYS,  DEPENDING ON CLINICAL CONDITION.   Avel Peace Mayerli Kirst M.D on 08/24/2018 at 11:07 AM  Between 7am to 6pm - Pager - [phone]  After 6pm go to [email or web address] - password [email or web address] [facility]  Office  [phone]  CC: Primary care physician; [name], MD

## 2018-08-24 NOTE — Progress Notes (Signed)
Lucilla Lame, MD [address] [place], [place] [address] Phone: [phone] Fax : [phone]   Subjective: Came to see this patient for possible PEG tube placement.  Had a long discussion with the brother who reports that his wife does not want the patient to have a PEG tube without further trying to feed him.  The patient has been given food that the wife states he does not like and that is where he does not eat it.  She reports that she fed him yesterday and he ate everything he liked to eat.  The patient's brother is very frustrated with his care.  The patient had a speech evaluation showing that he should be on a dysphasia diet but did not recommend alternate feeding unless he is unable to continue his nutritional intake. The patient was also noted to have abnormal liver enzymes which were over thousand.  They started to come down today.  The patient also had a right upper quadrant ultrasound that did not show any abnormalities to explain the patient's abnormal liver enzymes.   Objective: Vital signs in last 24 hours: Vitals:   08/24/18 0400 08/24/18 0500 08/24/18 0800 08/24/18 1200  BP: 137/77  113/83 (!) 123/105  Pulse: 84  94 95  Resp: 18  18 18   Temp: 98.1 F (36.7 C)  98.5 F (36.9 C)   TempSrc: Oral  Oral   SpO2: 98%  96% 97%  Weight:  61.9 kg    Height:       Weight change: -1.5 kg  Intake/Output Summary (Last 24 hours) at 08/24/2018 1307 Last data filed at 08/23/2018 2202 Gross per 24 hour  Intake 336 ml  Output -  Net 336 ml     Exam: Heart:: Regular rate and rhythm, S1S2 present or without murmur or extra heart sounds Lungs: normal and clear to auscultation and percussion Abdomen: soft, nontender, normal bowel sounds   Lab Results: @LABTEST2 @ Micro Results: No results found for this or any previous visit (from the past 240 hour(s)). Studies/Results: US Abdomen Limited Ruq  Result Date: 08/24/2018 CLINICAL DATA:  History of hepatitis  EXAM: ULTRASOUND ABDOMEN LIMITED RIGHT UPPER QUADRANT COMPARISON:  Abdominal and pelvic CT scan of August 11, 2018 FINDINGS: The study is limited due to the patient's clinical status. Gallbladder: No gallstones or wall thickening visualized. No sonographic Murphy sign noted by sonographer. Common bile duct: Diameter: 1.9 mm Liver: No focal lesion identified. Within normal limits in parenchymal echogenicity. No intrahepatic ductal dilation. The surface contour is smooth. Portal vein is patent on color Doppler imaging with normal direction of blood flow towards the liver. IMPRESSION: Normal limited right upper quadrant abdominal ultrasound examination. Electronically Signed   By: David  Martinique M.D.   On: 08/24/2018 09:33   Medications: I have reviewed the patient's current medications. Scheduled Meds: . aspirin  81 mg Per Tube Daily  . budesonide (PULMICORT) nebulizer solution  0.25 mg Nebulization BID  . chlorhexidine  15 mL Mouth/Throat BID  . donepezil  10 mg Per Tube BID  . enoxaparin (LOVENOX) injection  40 mg Subcutaneous Q24H  . feeding supplement (GLUCERNA 1.5 CAL)  1,000 mL Per Tube Q24H  . free water  180 mL Per Tube Q4H  . insulin aspart  0-15 Units Subcutaneous Q4H  . insulin glargine  15 Units Subcutaneous BID  . ipratropium-albuterol  3 mL Nebulization Q6H  . memantine  10 mg Per Tube BID  . metoprolol tartrate  25 mg  Per Tube BID  . polyethylene glycol  17 g Per Tube Daily  . senna-docusate  1 tablet Per Tube Daily   Continuous Infusions: . sodium chloride 10 mL/hr (08/13/18 2049)   PRN Meds:.sodium chloride, acetaminophen **OR** [DISCONTINUED] acetaminophen, albuterol, [DISCONTINUED] ondansetron **OR** ondansetron (ZOFRAN) IV   Assessment: Principal Problem:   CAP (community acquired pneumonia) Active Problems:   Uncontrolled type 2 diabetes mellitus with hyperglycemia, with long-term current use of insulin (HCC)   Essential hypertension   Alzheimer's dementia (HCC)    GERD (gastroesophageal reflux disease)   Fever   Acute respiratory failure (HCC)   Dysphagia   Aspiration pneumonia ([place])   Palliative care encounter   Pressure injury of skin    Plan: This patient has had dysphasia and poor p.o. intake.  The family is deciding whether to proceed with a PEG tube placement.  They would like him to have a few more days to try and get food that he likes and see if he will eat it.  The patient will continue his calorie count and if he does not keep up with his calories we will approach the family again about placing a feeding tube.  As for the patient's liver enzymes they are rapidly coming down and this is likely due to either hypotension, shock liver, or a medication reaction.  The patient's brother has been explained our plan and agrees with it.   LOS: 32 days   Arcenia Scarbro 08/24/2018, 1:07 PM

## 2018-08-24 NOTE — Progress Notes (Signed)
PT Cancellation Note  Patient Details Name: Maurice Patterson MRN: [id number] DOB: 1955/09/03   Cancelled Treatment:    Reason Eval/Treat Not Completed: Fatigue/lethargy limiting ability to participate;Patient's level of consciousness  Reviewed chart and it appears this afternoon pt has continued to be very lethargic and having difficulty with any participation even with max cuing.  Called and spoke with nursing who reports this to still be the case, and concurs that he would be very limited in ability to meaningfully participate with PT this afternoon.  Will check back tomorrow as appropriate.   Kreg Shropshire, DPT 08/24/2018, 5:08 PM

## 2018-08-24 NOTE — Progress Notes (Signed)
Tube feedings placed on hold by RN for CPT (Bed).

## 2018-08-24 NOTE — Progress Notes (Signed)
Tried to feed patient dinner. He took two bites of potatoes but refused to swallow. He to pocket the food in his mouth until I eventually had to remove it. Pt began to cough. Will try again at a later time.

## 2018-08-24 NOTE — Progress Notes (Signed)
Speech Language Pathology Treatment: Dysphagia  Patient Details Name: NOTNAMED SCHOLZ MRN: [id number] DOB: October 16, 1955 Today's Date: 08/24/2018 Time: 2426-8341 SLP Time Calculation (min) (ACUTE ONLY): 13 min  Assessment / Plan / Recommendation Clinical Impression  Patient presenting reclined in bed, asleep. SLP provided postural modifications in bed to increase alertness. SLP assisted with oral care with use of toothbrush/toothpaste/[place]. Patient with decreased alertness and required verbal cues and tactile cues to remain alertness for PO intake. Initially, patient shook head in refusal of PO intake this afternoon. Education and encouragement provided re: participation in today's session to ensure patient is tolerating puree/thin liquid diet and importance of alertness during meals. SLP attempted tactile stimulation with ice chip on outside of labial area. Patient turned head away from SLP. SLP attempted providing small ice chips in oral cavity, however patient with decreased awareness of bolus resulting in anterior oral spillage. Patient began making snoring-type sounds and would not open eyes for SLP, even with max verbal/tactile cues. D/t increase aspiration risk while attempting PO intake when patient is not alert or participating in today's session, PO intake ceased at this time. D/t clinical observations and behaviors reported by staff (I.e. intermittent lethargy), SLP concerned for patient's ability to sustain nutrition/hydration via PO intake. Recommend to f/u with MD and Dietician re: POC for alternate means of nutrition/hydration per family preference. MBS still to be considered if/when patient's alertness increases for participation in objective testing. Will f/u re: appropriateness of patient completing an MBS.    HPI HPI: Pt is a 63 y.o male with past medical history of Alzheimer's Dementia, CAD, hypertension, seizure disorder on Lamictal 100 mg twice daily, chronic pancreatitis, herpes  simplex encephalitis, diabetes, CVA, MI, hyperlipidemia, and Alzheimer's type dementia presenting to the ED 07/22/2018 with 2 days of progressive lethargy and weakness.  Patient is altered and unable to provide history so most of the history obtained from patient's chart.  Per ED reports patient's wife report that prior to this episode patient has been able to care for himself and ambulate around the house without assistance.  However for the past 2 days patient has been increasingly lethargic, unable to walk or care for himself which is not normal for him.  She was concerned that patient might have had a stroke therefore brought him to the ED. On arrival to the ED, initial CT scan showed no acute intracranial abnormality.  Labs revealed slightly elevated ammonia of 38, potassium of 2.8, normal white count blood cultures pending.  X-ray showed suspected subtle infiltrate in the right upper lung and left base.  He had a follow-up MRI of the brain which did not show acute intracranial abnormality.  Patient has been evaluated in the past (2017) with similar presentation at [facility]. He had extensive work up including LP done on 11/03/2015 with reassuring cell count and negative CSF HSV, VZV, and culture. Pt's presentation has been felt to be metabolic in nature, neurology was consulted, negative work-up to include lumbar puncture and brain MRI.  Due to pt's declining respiratory status during admission, he has been placed on BiPAP then orally intubated ~12/8-12/11.  Pt is now extubated but continues to have thick sounding secretions during an involuntary cough.  He has a declined Cognitive status w/ reduced alertness to task of po's; he responded only to mod verbal/tactile stim by SLP during this session. Noted Left pupil appeared larger than the Right pupil. He was nonverbal w/ open-mouth posture. Sister present.       SLP Plan  Continue with current plan of care      Recommendations  Diet recommendations:  Dysphagia 1 (puree);Thin liquid Liquids provided via: Cup Medication Administration: Other (Comment) Supervision: Full supervision/cueing for compensatory strategies Compensations: Minimize environmental distractions;Slow rate;Small sips/bites;Monitor for anterior loss;Follow solids with liquid Postural Changes and/or Swallow Maneuvers: Seated upright 90 degrees                Oral Care Recommendations: Oral care before and after PO Follow up Recommendations: Skilled Nursing facility SLP Visit Diagnosis: Dysphagia, oropharyngeal phase (R13.12) Plan: Continue with current plan of care       Loni Beckwith, M.S. CCC-SLP Speech-Language Pathologist    Loni Beckwith 08/24/2018, 3:22 PM

## 2018-08-24 NOTE — Progress Notes (Addendum)
Nutrition Follow-up  DOCUMENTATION CODES:   Not applicable  INTERVENTION:  Will transition patient to nocturnal feeds to stimulate appetite during the day. Initiate Glucerna 1.5 Cal at 95 mL/hr over 14 hours (1800-0800; 1330 mL goal daily volume). Provides 1995 kcal, 110 grams of protein, 1011 mL H2O daily. Continue free water flush of 180 mL Q4hrs for a total of 2091 mL H2O including water in tube feeding. Goal TF meets 100% RDIs for vitamins/minerals.  Family has requested to watch how patient eats over the next 72 hours before deciding on a PEG tube. RD ordered 72 hour calorie count. Intake from 1/1 is documented below in assessment portion of note. Very concerned that patient is not eating the protein portions of his meals. If he is to thrive after discharge with therapy and regaining strength, he needs to be able to meet ALL of his calorie, protein, and fluid needs, which may only be able to be accomplished with a PEG tube at this point. Discussed that if they choose to place a PEG tube he can continue to eat and feeds can be provided overnight so he is hungry during the day. Family is aware that the tube can be removed at a later time in outpatient setting if his intake improves.  Entered care order to assist patient with eating all meals per family's request. Please feed protein portion of meal first (pureed meat/fish/poultry or pureed eggs).  Updated [employer] (food ordering system) with patient's dislikes provided by wife. He only dislikes beets and grits. She reports he likes everything else available on puree menu (beets are not an option, but still entered it as a dislike). Will provide list of menu options in room as there is not a separate menu for the dysphagia diets.  NUTRITION DIAGNOSIS:   Inadequate oral intake related to (acute metabolic encephalopathy) as evidenced by meal completion < 25%.  Ongoing - meeting needs with tube feeds.  GOAL:   Provide needs based on  ASPEN/SCCM guidelines  Met with tube feeds.  MONITOR:   Vent status, Labs, Weight trends, TF tolerance, Skin, I & O's  REASON FOR ASSESSMENT:   Ventilator, Consult Enteral/tube feeding initiation and management  ASSESSMENT:   63 year old male with PMHx of seizures, dementia, arthritis, HTN, DM, hx TIA, CAD, CHF, HLD, pancreatitis, CKD, hx CVA 08/2017 who is admitted with PNA, acute metabolic encephalopathy with prior history of HSV encephalitis s/p lumbar puncture on 12/4.   Significant events: -Patient was seen by this RD for initial assessment on 12/5. Received consult for TPN initiation. Patient was not an ideal candidate for TPN but patient's family was requesting nutrition be started and team felt he would not keep an NGT in place. TPN was not initiated as PICC line could not be placed. -On evening of 12/7patient developed progressive respiratory failure with presumptive aspiration. He transferred to ICU and was initially placed on BiPAP but later required intubation. -Patient was extubated on 12/11. -On 12/13 patient went to IR for post-pyloric feeding tube placement. Tip of tube was unable to be advanced into the duodenum so terminated in stomach. -Patient was re-intubated on 12/17 in setting of respiratory failure and worsening unresponsiveness. -Extubated on 12/23. -Following SLP evaluation on 12/31 diet was advanced to dysphagia 1 (puree) with thin liquids (cup only).  Met with patient and his brother at bedside. Patient remains encephalopathic. He does wake occasionally for meals. Patient continues to tolerate goal TF regimen. Abdomen is soft. Last BM was a small  type 4 this AM. Brother has discussed plan of care with patient's wife and with GI and the family would like to see how the patient eats for a few more days before deciding on a PEG tube since he just started eating a few days ago. We discussed that he only ate bites yesterday. The RN note from last night reported he ate  50% of his dinner, but the brother clarified that he only ate the mashed potatoes and gravy and pureed carrots, so he did not have any protein, which is concerning. Will start a calorie count so we can get a better idea of intake. Brother also asked patient's wife about patient's meal preferences. She reported that the only foods he does not like are beets and grits. He likes everything else available on puree menu per her report. Beets are actually not available here but still entered that as a dislike just in case.  PO Intake: 1/1 Breakfast: 1.5 tsp (?unsure of what); sip of fluid before he began coughing 1/1 Lunch: 10 total bites of mashed potatoes with gravy and pureed carrots; 100 mL liquids 1/1 Dinner: mashed potatoes with poultry gravy and pureed carrots; 336 mL liquids Total for 1/1: 284 kcal (15.4% minimum estimated kcal needs), 2 grams of protein (2.4% minimum estimated protein needs), 446 mL fluids (23% minimum estimated fluid needs)  Enteral Access: NGT placed 12/13 by IR; tip terminates in gastric fundus per CT abd/pelvis 12/20; 81 cm at right nare  TF: pt tolerating goal TF regimen of Glucerna 1.5 Cal at 55 mL/hr + FWF 180 mL Q4hrs  Medications reviewed and include: Novolog 0-15 units Q4hrs, Lantus 15 units BID, Miralax 17 grams daily per tube, senna-docusate 1 tablet daily per tube.  Labs reviewed: CBG 92-250, Creatinine 0.59.  I/O: UOP unmeasured; pt had 3 occurrences UOP yesterday  Weight trend: 61.9 kg on 1/2; -9.7 kg from wt on 12/1 of 71.6 kg  Discussed with RN and on rounds. Also discussed with Attending and GI.  Diet Order:   Diet Order            DIET - DYS 1 Room service appropriate? Yes; Fluid consistency: Thin  Diet effective now             EDUCATION NEEDS:   No education needs have been identified at this time  Skin:  Skin Assessment: Skin Integrity Issues:(DTI to sacrum; ecchymosis to bilateral arms)  Last BM:  08/24/2017 - small type 4  Height:    Ht Readings from Last 1 Encounters:  07/29/18 _0  (1.676 m)   Weight:   Wt Readings from Last 1 Encounters:  08/24/18 61.9 kg   Ideal Body Weight:  70 kg  BMI:  Body mass index is 22.03 kg/m.  Estimated Nutritional Needs:   Kcal:  1845-2150 (MSJ x 1.2-1.4)  Protein:  85-105 grams (1.2-1.5 grams/kg)  Fluid:  >1.9L/day   Willey Blade, MS, RD, LDN Office: [phone] Pager: [phone] After Hours/Weekend Pager: [phone]

## 2018-08-24 NOTE — Progress Notes (Signed)
Patient forcefully vomiting, dobhoff curled out in mouth. zofran given iV tf remain on hold.

## 2018-08-25 ENCOUNTER — Telehealth: Payer: Self-pay | Admitting: Gastroenterology

## 2018-08-25 ENCOUNTER — Inpatient Hospital Stay: Payer: PPO

## 2018-08-25 ENCOUNTER — Inpatient Hospital Stay: Payer: Self-pay

## 2018-08-25 LAB — BLOOD GAS, ARTERIAL
Acid-Base Excess: 5.2 mmol/L — ABNORMAL HIGH (ref 0.0–2.0)
Acid-Base Excess: 8 mmol/L — ABNORMAL HIGH (ref 0.0–2.0)
Bicarbonate: 26.9 mmol/L (ref 20.0–28.0)
Bicarbonate: 30.6 mmol/L — ABNORMAL HIGH (ref 20.0–28.0)
FIO2: 0.21
FIO2: 0.28
O2 Saturation: 90.9 %
O2 Saturation: 97.8 %
PH ART: 7.51 — AB (ref 7.350–7.450)
Patient temperature: 37
Patient temperature: 39.3
pCO2 arterial: 30 mmHg — ABNORMAL LOW (ref 32.0–48.0)
pCO2 arterial: 35 mmHg (ref 32.0–48.0)
pH, Arterial: 7.56 — ABNORMAL HIGH (ref 7.350–7.450)
pO2, Arterial: 61 mmHg — ABNORMAL LOW (ref 83.0–108.0)
pO2, Arterial: 87 mmHg (ref 83.0–108.0)

## 2018-08-25 LAB — HEPATITIS PANEL, ACUTE
HCV Ab: 0.2 s/co ratio (ref 0.0–0.9)
Hep A IgM: NEGATIVE
Hep B C IgM: NEGATIVE
Hepatitis B Surface Ag: NEGATIVE

## 2018-08-25 LAB — CBC
HEMATOCRIT: 43.4 % (ref 39.0–52.0)
Hemoglobin: 14.1 g/dL (ref 13.0–17.0)
MCH: 30.4 pg (ref 26.0–34.0)
MCHC: 32.5 g/dL (ref 30.0–36.0)
MCV: 93.5 fL (ref 80.0–100.0)
NRBC: 0 % (ref 0.0–0.2)
Platelets: 196 10*3/uL (ref 150–400)
RBC: 4.64 MIL/uL (ref 4.22–5.81)
RDW: 17.5 % — ABNORMAL HIGH (ref 11.5–15.5)
WBC: 11 10*3/uL — ABNORMAL HIGH (ref 4.0–10.5)

## 2018-08-25 LAB — URINALYSIS, COMPLETE (UACMP) WITH MICROSCOPIC
BACTERIA UA: NONE SEEN
Bilirubin Urine: NEGATIVE
Glucose, UA: NEGATIVE mg/dL
Hgb urine dipstick: NEGATIVE
Ketones, ur: NEGATIVE mg/dL
Leukocytes, UA: NEGATIVE
Nitrite: NEGATIVE
Protein, ur: NEGATIVE mg/dL
Specific Gravity, Urine: 1.018 (ref 1.005–1.030)
pH: 6 (ref 5.0–8.0)

## 2018-08-25 LAB — COMPREHENSIVE METABOLIC PANEL
ALK PHOS: 236 U/L — AB (ref 38–126)
ALT: 683 U/L — ABNORMAL HIGH (ref 0–44)
AST: 103 U/L — ABNORMAL HIGH (ref 15–41)
Albumin: 3.2 g/dL — ABNORMAL LOW (ref 3.5–5.0)
Anion gap: 10 (ref 5–15)
BUN: 18 mg/dL (ref 8–23)
CO2: 26 mmol/L (ref 22–32)
Calcium: 8.6 mg/dL — ABNORMAL LOW (ref 8.9–10.3)
Chloride: 105 mmol/L (ref 98–111)
Creatinine, Ser: 0.86 mg/dL (ref 0.61–1.24)
GFR calc Af Amer: >60 mL/min (ref >60)
GFR calc non Af Amer: >60 mL/min (ref >60)
GLUCOSE: 142 mg/dL — AB (ref 70–99)
Potassium: 4.3 mmol/L (ref 3.5–5.1)
SODIUM: 141 mmol/L (ref 135–145)
Total Bilirubin: 0.7 mg/dL (ref 0.3–1.2)
Total Protein: 6.5 g/dL (ref 6.5–8.1)

## 2018-08-25 LAB — LACTIC ACID, PLASMA
Lactic Acid, Venous: 1.3 mmol/L (ref 0.5–1.9)
Lactic Acid, Venous: 1.5 mmol/L (ref 0.5–1.9)

## 2018-08-25 LAB — PROCALCITONIN
Procalcitonin: <0.1 ng/mL
Procalcitonin: 0.54 ng/mL

## 2018-08-25 LAB — GLUCOSE, CAPILLARY
Glucose-Capillary: 108 mg/dL — ABNORMAL HIGH (ref 70–99)
Glucose-Capillary: 118 mg/dL — ABNORMAL HIGH (ref 70–99)
Glucose-Capillary: 129 mg/dL — ABNORMAL HIGH (ref 70–99)
Glucose-Capillary: 129 mg/dL — ABNORMAL HIGH (ref 70–99)
Glucose-Capillary: 135 mg/dL — ABNORMAL HIGH (ref 70–99)
Glucose-Capillary: 146 mg/dL — ABNORMAL HIGH (ref 70–99)
Glucose-Capillary: 155 mg/dL — ABNORMAL HIGH (ref 70–99)

## 2018-08-25 LAB — BASIC METABOLIC PANEL
ANION GAP: 8 (ref 5–15)
BUN: 17 mg/dL (ref 8–23)
CO2: 28 mmol/L (ref 22–32)
Calcium: 8.8 mg/dL — ABNORMAL LOW (ref 8.9–10.3)
Chloride: 103 mmol/L (ref 98–111)
Creatinine, Ser: 0.7 mg/dL (ref 0.61–1.24)
GFR calc Af Amer: >60 mL/min (ref >60)
GFR calc non Af Amer: >60 mL/min (ref >60)
Glucose, Bld: 123 mg/dL — ABNORMAL HIGH (ref 70–99)
Potassium: 4.5 mmol/L (ref 3.5–5.1)
Sodium: 139 mmol/L (ref 135–145)

## 2018-08-25 LAB — FUNGUS CULTURE WITH STAIN

## 2018-08-25 LAB — FUNGAL ORGANISM REFLEX

## 2018-08-25 LAB — PROTIME-INR
INR: 1.43
PROTHROMBIN TIME: 17.3 s — AB (ref 11.4–15.2)

## 2018-08-25 LAB — APTT: aPTT: 43 seconds — ABNORMAL HIGH (ref 24–36)

## 2018-08-25 LAB — FUNGUS CULTURE RESULT

## 2018-08-25 MED ORDER — SODIUM CHLORIDE 0.9 % IV SOLN
INTRAVENOUS | Status: DC
  Administered 2018-08-25 – 2018-08-27 (×3): via INTRAVENOUS

## 2018-08-25 MED ORDER — LEVOFLOXACIN IN D5W 750 MG/150ML IV SOLN
750.0000 mg | INTRAVENOUS | Status: DC
  Filled 2018-08-25: qty 150

## 2018-08-25 MED ORDER — CLINDAMYCIN PHOSPHATE 300 MG/50ML IV SOLN
300.0000 mg | Freq: Three times a day (TID) | INTRAVENOUS | Status: DC
  Filled 2018-08-25 (×2): qty 50

## 2018-08-25 MED ORDER — DEXTROSE-NACL 5-0.45 % IV SOLN
INTRAVENOUS | Status: DC
  Administered 2018-08-25 – 2018-08-28 (×4): via INTRAVENOUS

## 2018-08-25 MED ORDER — SODIUM CHLORIDE 0.9 % IV SOLN
3.0000 g | Freq: Three times a day (TID) | INTRAVENOUS | Status: DC
  Administered 2018-08-25 – 2018-08-30 (×17): 3 g via INTRAVENOUS
  Filled 2018-08-25 (×21): qty 3

## 2018-08-25 MED ORDER — ACETAMINOPHEN 650 MG RE SUPP
650.0000 mg | RECTAL | Status: DC | PRN
  Administered 2018-08-25: 650 mg via RECTAL

## 2018-08-25 NOTE — Progress Notes (Signed)
Patient's brother called RN twice about talking to dietician and GI MD about feeding tube, wanting their numbers. Brother claimed that he talked to the patient's wife about this prior to calling RN. RN called patient's wife to verify the conversation. Wife requested to have the password changed and that she to be the sole person to talk to GI MD and dietician. Wife also requested to be the sole person to give patient information to Dietician consulted. Speech therapy consulted. Patient relations consulted. Charge RN consulted.

## 2018-08-25 NOTE — Progress Notes (Signed)
Pharmacy Antibiotic Note  Maurice Patterson is a 63 y.o. male admitted on 07/22/2018 with pneumonia.  Pharmacy has been consulted for Unasyn dosing.  Plan: Will start Unasyn 3g IV q8h for aspiration pneumonia  Height: 5\' 6"  (167.6 cm) Weight: 136 lb 7.4 oz (61.9 kg) IBW/kg (Calculated) : 63.8  Temp (24hrs), Avg:99 F (37.2 C), Min:98.1 F (36.7 C), Max:102.8 F (39.3 C)  Recent Labs  Lab 08/19/18 0442 08/22/18 0316 08/24/18 0311  WBC 6.7 7.2 6.5  CREATININE 0.65 0.70 0.59*    Estimated Creatinine Clearance: 84.9 mL/min (A) (by C-G formula based on SCr of 0.59 mg/dL (L)).    Allergies  Allergen Reactions  . Hydrocodone Anaphylaxis  . Morphine Other (See Comments)    Loss of memory  . Ambien [Zolpidem] Other (See Comments)    delirium    . Brilinta [Ticagrelor] Other (See Comments)    Stroke   . Flexeril [Cyclobenzaprine] Other (See Comments)    delerium   . Flunitrazepam Other (See Comments)    ROHYPNOL (hallucinations)  . Haldol [Haloperidol Lactate] Other (See Comments)    delerium   . Levetiracetam Diarrhea and Other (See Comments)    Unable to walk  . Lorazepam Hives  . Risperdal [Risperidone] Other (See Comments)    Delirium   . Trazodone Other (See Comments)    Delirium Can take in low doses   . Benadryl [Diphenhydramine Hcl (Sleep)] Rash  . Penicillins Rash    Mouth ulcers Has patient had a PCN reaction causing immediate rash, facial/tongue/throat swelling, SOB or lightheadedness with hypotension: Yes Has patient had a PCN reaction causing severe rash involving mucus membranes or skin necrosis: No Has patient had a PCN reaction that required hospitalization No Has patient had a PCN reaction occurring within the last 10 years: Yes If all of the above answers are "NO", then may proceed with Cephalosporin use.    Thank you for allowing pharmacy to be a part of this patient's care.  Tobie Lords, PharmD, BCPS Clinical Pharmacist 08/25/2018

## 2018-08-25 NOTE — Progress Notes (Signed)
Spoke to primary RN regarding PICC placement. Picc not needed anymore, RN to d/c order.

## 2018-08-25 NOTE — Progress Notes (Signed)
PT working with patient, unable to complete CPT at this time.

## 2018-08-25 NOTE — Clinical Social Work Note (Signed)
Patient is still not medically stable for discharge. Staff is reporting patient's wife will not allow patient to be transferred out of the ICU. Patient's feeding route cannot be managed in a skilled nursing facility. CSW continuing to follow. Shela Leff MSW,LCSW [phone]

## 2018-08-25 NOTE — Progress Notes (Signed)
Patient's wife agreed to have a PEG tube placed. GI MD notified. GI MD instructed that the procedure would be done next week. Asked GI MD if it would be appropriate to reinsert NG tube. GI MD gave verbal order to reinsert NG tube.

## 2018-08-25 NOTE — Progress Notes (Addendum)
Nutrition Follow-up  DOCUMENTATION CODES:   Not applicable  INTERVENTION:   Recommend IR placement of post pyloric NGT and continue tube feeds until PEG tube able to be placed  If NGT successfully placed recommend initiate Glucerna 1.5 Cal at 95 mL/hr over 14 hours (1800-0800; 1330 mL goal daily volume). Provides 1995 kcal, 110 grams of protein, 1011 mL H2O daily. Continue free water flush of 180 mL Q4hrs for a total of 2091 mL H2O including water in tube feeding. Goal TF meets 100% RDIs for vitamins/minerals.  Do not recommend TPN at this time as pt would likely only receive 2 days of goal TPN before PEG placement.   If MD wishes to proceed with TPN initiation recommend   Clinimix 5/15 with electrolytes at 31m/hr + 20% lipids '@25ml'$ /hr x 12 hrs/day (Goal rate 859mhr once labs stable)   Regimen @ goal rate provides 2017kcal/day, 100g/day protein, 229244molume    Add MVI daily   Add trace elements daily    Add IV thiamine '100mg'$  daily x 3 days   Pt at high refeeding risk; recommend check P, K, and Mg daily for 3 days after TPN iniatition.    Daily weights   Recommend decrease IVF rate to 72m4m and discontinue when TPN at goal rate  NUTRITION DIAGNOSIS:   Inadequate oral intake related to (acute metabolic encephalopathy) as evidenced by meal completion < 25%.  GOAL:   Patient will meet greater than or equal to 90% of their needs  Met with tube feeds.  MONITOR:   PO intake, Supplement acceptance, Labs, Weight trends, Skin, I & O's  ASSESSMENT:   62 y82r old male with PMHx of seizures, dementia, arthritis, HTN, DM, hx TIA, CAD, CHF, HLD, pancreatitis, CKD, hx CVA 08/2017 who is admitted with PNA, acute metabolic encephalopathy with prior history of HSV encephalitis s/p lumbar puncture on 12/4.   Pt vomited up NGT today. Family has requested PEG tube which will not be able to be placed until early next week. RD received consult for TPN. RD does not recommend TPN as pt  has functional GI tract and using the GI system for enteral feeding helps prevent gut mucosal atrophy, reduces septic complications by decreasing bacterial translocation, stimulates gut motility therefore reducing the risk of ileus, and enhances the intestinal immune system. TPN would not be able to start until 1800 on 1/4, would not reach goal until 1/5 and pt likely to get PEG tube on 1/6 or 1/7. It is not worth the risk of infection of PICC line or risk of thrombus to place pt on TPN at goal for 1-2 days. Recommend IR placement of post pyloric NGT and continue enteral feedings until PEG tube is able to be placed. If pt alert, can provide pt with Ensure supplements to help pt meet his needs until tube can be placed.    Medications reviewed and include: aspirin, lovenox, insulin, miralax, senokot, NaCl '@75ml'$ /hr, unasyn   Labs reviewed: AlkPhos 236(H), AST 103(H), ALT 683(H) WBC- 11.0(H)  Diet Order:   Diet Order            DIET - DYS 1 Room service appropriate? Yes; Fluid consistency: Thin  Diet effective now             EDUCATION NEEDS:   No education needs have been identified at this time  Skin:  Skin Assessment: Skin Integrity Issues:(DTI to sacrum; ecchymosis to bilateral arms)  Last BM:  1/2- type 5  Height:   Ht  Readings from Last 1 Encounters:  07/29/18 '5\' 6"'$  (1.676 m)   Weight:   Wt Readings from Last 1 Encounters:  08/25/18 61.1 kg   Ideal Body Weight:  70 kg  BMI:  Body mass index is 21.74 kg/m.  Estimated Nutritional Needs:   Kcal:  1845-2150 (MSJ x 1.2-1.4)  Protein:  85-105 grams (1.2-1.5 grams/kg)  Fluid:  >1.9L/day   Koleen Distance MS, RD, LDN Pager #- [phone] Office#- [phone] After Hours Pager: [phone]

## 2018-08-25 NOTE — Progress Notes (Signed)
Attempted to place NG tube without success. Asked rounding MD for a radiology consult to place NG tube. Received new orders for a PICC line and TPN. Updated wife via phone.

## 2018-08-25 NOTE — Progress Notes (Signed)
Physical Therapy Treatment Patient Details Name: Maurice Patterson MRN: [id number] DOB: Apr 18, 1956 Today's Date: 08/25/2018    History of Present Illness 63 y/o male here with pneumonia and respiratory distress has intubated (on and off) and ultimately extubated 12/23.      PT Comments    Very limited session with pt asleep the entire time despite heavy cues, sternal/shoulder/foot rubs, calling name, etc to try to wake him.  Family present and assisted in attempts to wake him with negligible response.  Pt had what seemed to be inconsistent/infrequent response to PROM/pressure stimuli but ultimately all tasks were PROM and there was no noticeable voluntary response t/o the session.  Educated family on PROM HEP and encouraged them to perform QD, daughter states that they have been doing some exercises with him.   Pt has not been able to meaningfully participate with PT.    Follow Up Recommendations  SNF     Equipment Recommendations  None recommended by PT    Recommendations for Other Services       Precautions / Restrictions Precautions Precautions: Fall Restrictions Weight Bearing Restrictions: No    Mobility  Bed Mobility               General bed mobility comments: deferred all bed mobilty, not appropriate  Transfers                    Ambulation/Gait                 Stairs             Wheelchair Mobility    Modified Rankin (Stroke Patients Only)       Balance                                            Cognition Arousal/Alertness: Lethargic Behavior During Therapy: Flat affect Overall Cognitive Status: Impaired/Different from baseline Area of Impairment: Orientation;Attention;Following commands;Safety/judgement;Awareness                               General Comments: Pt did not open eyes or give any active response to activity      Exercises General Exercises - Lower Extremity Ankle  Circles/Pumps: PROM;15 reps;Both Short Arc Quad: PROM;10 reps;Both Heel Slides: PROM;15 reps;Both Hip ABduction/ADduction: PROM;15 reps;Both    General Comments        Pertinent Vitals/Pain Pain Assessment: (unable to rate, occasional response to movement (pain?))    Home Living                      Prior Function            PT Goals (current goals can now be found in the care plan section) Progress towards PT goals: Not progressing toward goals - comment(pt very lethargic and non-interactive)    Frequency    Min 2X/week      PT Plan Current plan remains appropriate    Co-evaluation              AM-PAC PT "6 Clicks" Mobility   Outcome Measure  Help needed turning from your back to your side while in a flat bed without using bedrails?: Total Help needed moving from lying on your back to sitting on the side of a flat bed without using  bedrails?: Total Help needed moving to and from a bed to a chair (including a wheelchair)?: Total Help needed standing up from a chair using your arms (e.g., wheelchair or bedside chair)?: Total Help needed to walk in hospital room?: Total Help needed climbing 3-5 steps with a railing? : Total 6 Click Score: 6    End of Session   Activity Tolerance: Patient limited by lethargy Patient left: in bed Nurse Communication: Mobility status PT Visit Diagnosis: Other abnormalities of gait and mobility (R26.89);Muscle weakness (generalized) (M62.81)     Time: 7092-9574 PT Time Calculation (min) (ACUTE ONLY): 16 min  Charges:  $Therapeutic Exercise: 8-22 mins                     Kreg Shropshire, DPT 08/25/2018, 4:29 PM

## 2018-08-25 NOTE — Progress Notes (Addendum)
Was called to bedside by nursing due to concern that pt was more lethargic than previous, and nursing reported that pt had episode of vomiting during bath earlier in the shift and his NG tube was pulled out.  Concern is for aspiration pneumonia. Previously placed orders for STAT CXR, ABG, blood cultures, CBC, and BMP.  Pt noted to have new leukocytosis, and small bibasilar opacities concerning for aspiration.  Pt has already been placed on empiric Unasyn for aspiration coverage.  ABG without respiratory acidosis, mild hypoxia noted.  Will place pt on supplemental O2.  Will obtain CT Head as well given somnolence.  Attempt made to contact pt's wife of his change in status.  No answer, therefore message was left for her to call back.   Darel Hong, AGACNP-BC [facility] Pager: [phone] Cell: [phone]

## 2018-08-25 NOTE — Progress Notes (Signed)
During report at 2330 previous nurse informed this nurse that patient had an episode of vomited during bath and that dobhoff had come out during episode. After report patient rounded on around 0000 and patient was minimally responsive to pain, making a snoring sound as if asleep with eyes open, HR in 120's, and temp of 102.8. Jearld Adjutant, NP notified and assessed patient. Orders placed (see orders). Patient given tylenol suppository for temperature. Will continue to assess patient.  Wilnette Kales

## 2018-08-25 NOTE — Progress Notes (Signed)
Patient not alert and following commands. Tried asking patient to open eyes on command. Patient not following this command. Holding meal tray for the time being. Will continue to monitor.

## 2018-08-25 NOTE — Progress Notes (Signed)
Received a call from dietician stating that patient is not a candidate for TPN. Asked another Rn to place NG tube. She was successful. Rounding MD notified of situation. Received new orders to discontinue PICC line and TPN as well as to start patient on IV dextrose with 1/2 normal saline and not to start tube feeds. Wife updated via phone.

## 2018-08-25 NOTE — Progress Notes (Signed)
Family Meeting Note  Advance Directive:yes  Today a meeting took place with the Patient and spouse.  Patient is unable to participate due UI:QNVVYX capacity obtunded   The following clinical team members were present during this meeting:MD  The following were discussed:Patient's diagnosis: Aspiration pneumonia, sepsis, Patient's progosis: Unable to determine and Goals for treatment: Full Code  Additional follow-up to be provided: prn  Time spent during discussion:20 minutes  Gorden Harms, MD

## 2018-08-25 NOTE — Progress Notes (Signed)
Patient not alert and following commands. Tried sternal rub and nail pinch to wake patient up. Patient did not open eyes on command. Family at bedside. Will hold food tray until patient is awake and alert enough to safely swallow food.

## 2018-08-25 NOTE — Progress Notes (Signed)
Nutrition Brief Note   Spoke with pt's wife, Cecille Rubin via phone today. RD answered all of her questions regarding PEG tube placement and usage. Explained that pt vomited out his NGT today which is fairly common. Wife would like to proceed with PEG tube placement and is ok with pt having another NGT placed to provide nutrition if PEG tube unable to be placed until next week. GI notified.    Koleen Distance MS, RD, LDN Pager #- [phone] Office#- [phone] After Hours Pager: [phone]

## 2018-08-25 NOTE — Telephone Encounter (Signed)
Pt Brother Derrek Monaco PA left vm he states his brother is in ICU and he had a long discussion with Dr. Allen Norris about Feeding tube  And have made a decision he would like a call [phone]

## 2018-08-25 NOTE — Progress Notes (Signed)
Scheduled Chest PT held for current treatment time. Patient vomited. Will resume scheduled CPT at next treatment time if patient can tolerate.

## 2018-08-25 NOTE — Progress Notes (Addendum)
[facility] NAME: Maurice Patterson    MR#:  [id number]  DATE OF BIRTH:  20-Apr-1956  SUBJECTIVE:  Events overnight noted as feeding to accidentally remove/large emesis overnight, patient more encephalopathic now, septic due to acute aspiration/HCAP, discussed with the patient's wife at length with all questions answered-she is now wanting PEG tube to be placed when able REVIEW OF SYSTEMS:  Review of Systems  Unable to perform ROS: Medical condition    DRUG ALLERGIES:   Allergies  Allergen Reactions  . Hydrocodone Anaphylaxis  . Morphine Other (See Comments)    Loss of memory  . Ambien [Zolpidem] Other (See Comments)    delirium    . Brilinta [Ticagrelor] Other (See Comments)    Stroke   . Flexeril [Cyclobenzaprine] Other (See Comments)    delerium   . Flunitrazepam Other (See Comments)    ROHYPNOL (hallucinations)  . Haldol [Haloperidol Lactate] Other (See Comments)    delerium   . Levetiracetam Diarrhea and Other (See Comments)    Unable to walk  . Lorazepam Hives  . Risperdal [Risperidone] Other (See Comments)    Delirium   . Trazodone Other (See Comments)    Delirium Can take in low doses   . Benadryl [Diphenhydramine Hcl (Sleep)] Rash  . Penicillins Rash    Mouth ulcers Has patient had a PCN reaction causing immediate rash, facial/tongue/throat swelling, SOB or lightheadedness with hypotension: Yes Has patient had a PCN reaction causing severe rash involving mucus membranes or skin necrosis: No Has patient had a PCN reaction that required hospitalization No Has patient had a PCN reaction occurring within the last 10 years: Yes If all of the above answers are "NO", then may proceed with Cephalosporin use.    VITALS:  Blood pressure 133/79, pulse (!) 117, temperature (!) 100.9 F (38.3 C), temperature source Axillary, resp. rate (!) 27, height 5\' 6"  (1.676 m), weight 60.6 kg, SpO2 94 %.  PHYSICAL  EXAMINATION:  Physical Exam   GENERAL:  63 y.o.-year-old patient lying in the bed with mild acute distress.  Cachectic/malnourished appearance EYES: Pupils equal, round, reactive to light and accommodation. No scleral icterus. Extraocular muscles intact.  HEENT: Head atraumatic, normocephalic. Oropharynx and nasopharynx clear.  NECK:  Supple, no jugular venous distention. No thyroid enlargement, no tenderness.  LUNGS: Bilateral rhonchi/Rales with wheezing, diminished breath sounds.  Mild use of accessory muscles of respiration.  Decreased bibasilar breath sounds CARDIOVASCULAR: S1, S2 normal. No murmurs, rubs, or gallops.  ABDOMEN: Soft, nontender, nondistended. Bowel sounds present. No organomegaly or mass.  EXTREMITIES: No pedal edema, cyanosis, or clubbing.  NEUROLOGIC: Cranial nerves II through XII are intact.  No facial droop noted.  MAES. Both hands in mittens.  Nonverbal.  Not following commands   PSYCHIATRIC: The patient is obtunded    SKIN: No obvious rash, lesion, or ulcer.    LABORATORY PANEL:   CBC Recent Labs  Lab 08/25/18 0032  WBC 11.0*  HGB 14.1  HCT 43.4  PLT 196   ------------------------------------------------------------------------------------------------------------------  Chemistries  Recent Labs  Lab 08/19/18 0442  08/25/18 0404  NA 142   < > 141  K 3.6   < > 4.3  CL 108   < > 105  CO2 25   < > 26  GLUCOSE 240*   < > 142*  BUN 19   < > 18  CREATININE 0.65   < > 0.86  CALCIUM 8.3*   < > 8.6*  MG 2.2  --   --   AST  --    < > 103*  ALT  --    < > 683*  ALKPHOS  --    < > 236*  BILITOT  --    < > 0.7   < > = values in this interval not displayed.   ------------------------------------------------------------------------------------------------------------------  Cardiac Enzymes No results for input(s): TROPONINI in the last 168  hours. ------------------------------------------------------------------------------------------------------------------  RADIOLOGY:  Ct Head Wo Contrast  Result Date: 08/25/2018 CLINICAL DATA:  Altered mental status (AMS), unclear cause EXAM: CT HEAD WITHOUT CONTRAST TECHNIQUE: Contiguous axial images were obtained from the base of the skull through the vertex without intravenous contrast. COMPARISON:  Head CT 08/08/2018 FINDINGS: Brain: Unchanged atrophy and chronic small vessel ischemia. No intracranial hemorrhage, mass effect, or midline shift. No hydrocephalus. The basilar cisterns are patent. No evidence of territorial infarct or acute ischemia. No extra-axial or intracranial fluid collection. Vascular: Atherosclerosis of skullbase vasculature without hyperdense vessel or abnormal calcification. Skull: No fracture or focal lesion. Sinuses/Orbits: Paranasal sinuses and mastoid air cells are clear. The visualized orbits are unremarkable. Other: None. IMPRESSION: 1. No acute intracranial abnormality. 2. Unchanged atrophy and chronic small vessel ischemia. Electronically Signed   By: Keith Rake M.D.   On: 08/25/2018 02:17   Dg Chest Port 1 View  Result Date: 08/25/2018 CLINICAL DATA:  Assess for aspiration. EXAM: PORTABLE CHEST 1 VIEW COMPARISON:  Chest radiograph performed 08/17/2018 FINDINGS: The lungs are well-aerated. Minimal bibasilar opacities may reflect atelectasis or possibly mild pneumonia; given clinical concern, aspiration cannot be entirely excluded. Peribronchial thickening is noted. There is no evidence of pleural effusion or pneumothorax. The cardiomediastinal silhouette is within normal limits. No acute osseous abnormalities are seen. A large chronic sclerotic lesion is noted at the left mid humerus. IMPRESSION: Minimal bibasilar opacities may reflect atelectasis or possibly mild pneumonia; given clinical concern, aspiration cannot be entirely excluded. Peribronchial thickening  noted. Electronically Signed   By: Garald Balding M.D.   On: 08/25/2018 00:48   US Abdomen Limited Ruq  Result Date: 08/24/2018 CLINICAL DATA:  History of hepatitis EXAM: ULTRASOUND ABDOMEN LIMITED RIGHT UPPER QUADRANT COMPARISON:  Abdominal and pelvic CT scan of August 11, 2018 FINDINGS: The study is limited due to the patient's clinical status. Gallbladder: No gallstones or wall thickening visualized. No sonographic Murphy sign noted by sonographer. Common bile duct: Diameter: 1.9 mm Liver: No focal lesion identified. Within normal limits in parenchymal echogenicity. No intrahepatic ductal dilation. The surface contour is smooth. Portal vein is patent on color Doppler imaging with normal direction of blood flow towards the liver. IMPRESSION: Normal limited right upper quadrant abdominal ultrasound examination. Electronically Signed   By: David  Martinique M.D.   On: 08/24/2018 09:33    EKG:   Orders placed or performed during the hospital encounter of 07/22/18  . EKG 12-Lead  . EKG 12-Lead  . EKG 12-Lead  . EKG 12-Lead  . ED EKG  . ED EKG    ASSESSMENT AND PLAN:  63 year old male with past medical history significant for CAD, CHF, CKD, dementia, diabetes, hypertension and seizure disorder presents to hospital secondary to worsening weakness and poor appetite.  *Acute sepsis Most likely secondary to aspiration pneumonia/HCAP Sepsis protocol, empiric Unasyn, follow-up on cultures  *Acute aspiration pneumonia Noted large emesis overnight, feeding to was dislodged, chest x-ray noted for bilateral opacities/bilateral atelectasis versus pneumonia/peribronchial thickening Pneumonia protocol, antibiotics per above, follow-up on cultures Case was discussed with the  patient's wife-patient may require intubation if patient does not improve from respiratory standpoint  *Acute encephalopathy Worsening noted  Most likely multifactorial in nature  - ?  Secondary to medication side effect, worsening  herpes encephalitis, aspiration pneumonia, acute sepsis MRI brain negative for acute process, EEG concern for generalized slowing, LP done/negative for any Gram stain organisms, neurology following, psychotropic meds discontinued, continue neurochecks per routine, aspiration/fall/skin care precautions while in house, ammonia level normal, RPR negative, repeat CT head done today negative for any acute process  *Acute worsening transaminitis Improving CMP daily, liver ultrasound unimpressive, ammonia level normal, hepatitis panel negative, gastroenterology input appreciated-wife is now interested in PEG tube placement when patient is stable    *Acute hypoxic respiratory failure Stable Secondary to aspiration pneumonia-treated with course of IV antibiotics prior, restarted treatment again today for recurrent aspiration pneumonia today with associated sepsis patient admitted to the hospital on 07/23/2018, intubated on 07/29/2018 initially, was reintubated again on 08/07/2018.  *Acute dysphasia  Secondary to worsening encephalopathy  Wife is now interested in feeding tube placement, gastroneurology following, will plan for placement sometime next week when patient is more stable  It was reiterated to the patient's wife that patient needs a feeding tube as patient does not take in enough calories to maintain body weight or enough fluid to maintain hydration   * Hypokalemia and hyponatremia Repleted  *Chronic dementia continue Namenda, aricept independent and interactive at baseline according to wife  Chronic diabetes mellitus Stable on current regiment  DVT prophylaxis-on Lovenox  Physical therapy consulted-for skilled nursing facility once stable from medical standpoint  All the records are reviewed and case discussed with Care Management/Social Workerr. Management plans discussed with the patient, family and they are in agreement.  CODE STATUS: Full code  TOTAL TIME TAKING CARE OF THIS  PATIENT: 45 minutes.   POSSIBLE D/C IN 6-10 DAYS, DEPENDING ON CLINICAL CONDITION.   Avel Peace Lezley Bedgood M.D on 08/25/2018 at 12:51 PM  Between 7am to 6pm - Pager - [phone]  After 6pm go to [email or web address] - password [email or web address] [facility]  Office  [phone]  CC: Primary care physician; [name], MD

## 2018-08-26 LAB — COMPREHENSIVE METABOLIC PANEL
ALT: 383 U/L — ABNORMAL HIGH (ref 0–44)
AST: 43 U/L — ABNORMAL HIGH (ref 15–41)
Albumin: 3 g/dL — ABNORMAL LOW (ref 3.5–5.0)
Alkaline Phosphatase: 182 U/L — ABNORMAL HIGH (ref 38–126)
Anion gap: 6 (ref 5–15)
BUN: 14 mg/dL (ref 8–23)
CO2: 26 mmol/L (ref 22–32)
Calcium: 8 mg/dL — ABNORMAL LOW (ref 8.9–10.3)
Chloride: 108 mmol/L (ref 98–111)
Creatinine, Ser: 0.68 mg/dL (ref 0.61–1.24)
GFR calc Af Amer: >60 mL/min (ref >60)
GFR calc non Af Amer: >60 mL/min (ref >60)
Glucose, Bld: 144 mg/dL — ABNORMAL HIGH (ref 70–99)
Potassium: 3.4 mmol/L — ABNORMAL LOW (ref 3.5–5.1)
Sodium: 140 mmol/L (ref 135–145)
Total Bilirubin: 0.7 mg/dL (ref 0.3–1.2)
Total Protein: 6.2 g/dL — ABNORMAL LOW (ref 6.5–8.1)

## 2018-08-26 LAB — DIFFERENTIAL
Abs Immature Granulocytes: 0.03 10*3/uL (ref 0.00–0.07)
Basophils Absolute: 0 10*3/uL (ref 0.0–0.1)
Basophils Relative: 0 %
EOS PCT: 0 %
Eosinophils Absolute: 0 10*3/uL (ref 0.0–0.5)
Immature Granulocytes: 0 %
Lymphocytes Relative: 15 %
Lymphs Abs: 1.5 10*3/uL (ref 0.7–4.0)
Monocytes Absolute: 0.8 10*3/uL (ref 0.1–1.0)
Monocytes Relative: 8 %
Neutro Abs: 7.5 10*3/uL (ref 1.7–7.7)
Neutrophils Relative %: 77 %

## 2018-08-26 LAB — CBC
HCT: 37.8 % — ABNORMAL LOW (ref 39.0–52.0)
Hemoglobin: 12.2 g/dL — ABNORMAL LOW (ref 13.0–17.0)
MCH: 30.3 pg (ref 26.0–34.0)
MCHC: 32.3 g/dL (ref 30.0–36.0)
MCV: 93.8 fL (ref 80.0–100.0)
Platelets: 123 10*3/uL — ABNORMAL LOW (ref 150–400)
RBC: 4.03 MIL/uL — ABNORMAL LOW (ref 4.22–5.81)
RDW: 17.7 % — ABNORMAL HIGH (ref 11.5–15.5)
WBC: 9.9 10*3/uL (ref 4.0–10.5)
nRBC: 0 % (ref 0.0–0.2)

## 2018-08-26 LAB — GLUCOSE, CAPILLARY
Glucose-Capillary: 152 mg/dL — ABNORMAL HIGH (ref 70–99)
Glucose-Capillary: 133 mg/dL — ABNORMAL HIGH (ref 70–99)
Glucose-Capillary: 137 mg/dL — ABNORMAL HIGH (ref 70–99)
Glucose-Capillary: 164 mg/dL — ABNORMAL HIGH (ref 70–99)
Glucose-Capillary: 149 mg/dL — ABNORMAL HIGH (ref 70–99)
Glucose-Capillary: 188 mg/dL — ABNORMAL HIGH (ref 70–99)
Glucose-Capillary: 202 mg/dL — ABNORMAL HIGH (ref 70–99)

## 2018-08-26 LAB — TRIGLYCERIDES: Triglycerides: 62 mg/dL (ref <150)

## 2018-08-26 LAB — PREALBUMIN: Prealbumin: 9.6 mg/dL — ABNORMAL LOW (ref 18–38)

## 2018-08-26 LAB — MAGNESIUM: Magnesium: 2 mg/dL (ref 1.7–2.4)

## 2018-08-26 LAB — PHOSPHORUS: Phosphorus: 2.6 mg/dL (ref 2.5–4.6)

## 2018-08-26 MED ORDER — GLUCERNA 1.5 CAL PO LIQD
1000.0000 mL | ORAL | Status: DC
  Administered 2018-08-26: 1000 mL

## 2018-08-26 MED ORDER — FREE WATER
30.0000 mL | Status: DC
  Administered 2018-08-26 – 2018-08-27 (×6): 30 mL

## 2018-08-26 NOTE — Progress Notes (Signed)
Speech Therapy Note: reviewed chart notes; consulted NSG re: pt's status today. Pt remains lethargic and poorly arousable for more than a few seconds-minute when given mod-max stim. He has not been fully awake for safe oral intake so meal tray was held at breakfast - NSG to try again at lunch time. Pt's NG tube came out during episodes of vomiting during the night/morning - NSG to see if it is to be replaced per MD.  Phone conversation was had w/ Wife, Cecille Rubin, to answer her questions re: if a pt could eat w/ a PEG placement, and other general swallowing questions w/ discussion of aspiration precautions as well. Wife was satisfied w/ answers. Informed her that ST services would continue to follow pt's status while admitted. Wife agreed. NSG updated.    Orinda Kenner, [place], CCC-SLP

## 2018-08-26 NOTE — Progress Notes (Addendum)
Maurice Antigua, MD [address], [place], [place], [place], [address] [address], [place], [place], [place], [address] Phone: [phone]  Fax: [phone]   Subjective: Patient receiving Dobbhoff tube feeds.  Resting in bed and tolerating feeds well.  No family at bedside today.   Objective: Exam: Vital signs in last 24 hours: Vitals:   08/26/18 0500 08/26/18 0600 08/26/18 0700 08/26/18 0856  BP: 118/68 117/66 117/74 139/84  Pulse: 94 92 94 96  Resp: 16 19 18    Temp:      TempSrc:      SpO2: 100% 95% 97%   Weight:      Height:       Weight change: 0.5 kg  Intake/Output Summary (Last 24 hours) at 08/26/2018 1109 Last data filed at 08/26/2018 0900 Gross per 24 hour  Intake 1879.81 ml  Output 650 ml  Net 1229.81 ml    General: No acute distress, AAO x3 Abd: Soft, NT/ND, No HSM Skin: Warm, no rashes Neck: Supple, Trachea midline   Lab Results: Lab Results  Component Value Date   WBC 9.9 08/26/2018   HGB 12.2 (L) 08/26/2018   HCT 37.8 (L) 08/26/2018   MCV 93.8 08/26/2018   PLT 123 (L) 08/26/2018   Micro Results: Recent Results (from the past 240 hour(s))  CULTURE, BLOOD (ROUTINE X 2) w Reflex to ID Panel     Status: None (Preliminary result)   Collection Time: 08/25/18 12:32 AM  Result Value Ref Range Status   Specimen Description BLOOD RIGHT FATTY CASTS  Final   Special Requests   Final    BOTTLES DRAWN AEROBIC AND ANAEROBIC Blood Culture adequate volume   Culture   Final    NO GROWTH 1 DAY Performed at [facility], [address] [place], [place] [address]    Report Status PENDING  Incomplete  CULTURE, BLOOD (ROUTINE X 2) w Reflex to ID Panel     Status: None (Preliminary result)   Collection Time: 08/25/18 12:32 AM  Result Value Ref Range Status   Specimen Description BLOOD RIGHT HAND  Final   Special Requests   Final    BOTTLES DRAWN AEROBIC AND ANAEROBIC Blood Culture adequate volume   Culture   Final    NO GROWTH 1  DAY Performed at [facility], [address], [place], [place] [address]    Report Status PENDING  Incomplete  Culture, respiratory     Status: None (Preliminary result)   Collection Time: 08/25/18  6:13 AM  Result Value Ref Range Status   Specimen Description   Final    TRACHEAL ASPIRATE Performed at [facility], [address], [place], [place] [address]    Special Requests   Final    NONE Performed at [facility], [address] [place], [place] [address]    Gram Stain   Final    MODERATE WBC PRESENT, PREDOMINANTLY PMN RARE SQUAMOUS EPITHELIAL CELLS PRESENT FEW GRAM POSITIVE COCCI RARE YEAST    Culture   Final    CULTURE REINCUBATED FOR BETTER GROWTH Performed at [facility], [address] [place],  [address]    Report Status PENDING  Incomplete   Studies/Results: Dg Abd 1 View  Result Date: 08/25/2018 CLINICAL DATA:  NG tube placement. EXAM: ABDOMEN - 1 VIEW COMPARISON:  08/17/2018 FINDINGS: The feeding tube has been removed. A small caliber enteric tube has been placed and terminates over the proximal stomach with side hole below the  diaphragm. Gas and a moderate amount of stool are present in the colon. No dilated loops of bowel are seen to suggest obstruction. The lungs were more fully evaluated on today's earlier chest radiograph. IMPRESSION: Enteric tube terminates over the proximal stomach. Electronically Signed   By: Logan Bores M.D.   On: 08/25/2018 17:25   Ct Head Wo Contrast  Result Date: 08/25/2018 CLINICAL DATA:  Altered mental status (AMS), unclear cause EXAM: CT HEAD WITHOUT CONTRAST TECHNIQUE: Contiguous axial images were obtained from the base of the skull through the vertex without intravenous contrast. COMPARISON:  Head CT 08/08/2018 FINDINGS: Brain: Unchanged atrophy and chronic small vessel ischemia. No intracranial hemorrhage, mass effect, or midline shift. No hydrocephalus. The basilar cisterns are patent. No  evidence of territorial infarct or acute ischemia. No extra-axial or intracranial fluid collection. Vascular: Atherosclerosis of skullbase vasculature without hyperdense vessel or abnormal calcification. Skull: No fracture or focal lesion. Sinuses/Orbits: Paranasal sinuses and mastoid air cells are clear. The visualized orbits are unremarkable. Other: None. IMPRESSION: 1. No acute intracranial abnormality. 2. Unchanged atrophy and chronic small vessel ischemia. Electronically Signed   By: Keith Rake M.D.   On: 08/25/2018 02:17   Dg Chest Port 1 View  Result Date: 08/25/2018 CLINICAL DATA:  Assess for aspiration. EXAM: PORTABLE CHEST 1 VIEW COMPARISON:  Chest radiograph performed 08/17/2018 FINDINGS: The lungs are well-aerated. Minimal bibasilar opacities may reflect atelectasis or possibly mild pneumonia; given clinical concern, aspiration cannot be entirely excluded. Peribronchial thickening is noted. There is no evidence of pleural effusion or pneumothorax. The cardiomediastinal silhouette is within normal limits. No acute osseous abnormalities are seen. A large chronic sclerotic lesion is noted at the left mid humerus. IMPRESSION: Minimal bibasilar opacities may reflect atelectasis or possibly mild pneumonia; given clinical concern, aspiration cannot be entirely excluded. Peribronchial thickening noted. Electronically Signed   By: Garald Balding M.D.   On: 08/25/2018 00:48   [place] Ekg Site Rite  Result Date: 08/25/2018 If Site Rite image not attached, placement could not be confirmed due to current cardiac rhythm.  Medications:  Scheduled Meds: . aspirin  81 mg Per Tube Daily  . budesonide (PULMICORT) nebulizer solution  0.25 mg Nebulization BID  . chlorhexidine  15 mL Mouth/Throat BID  . donepezil  10 mg Per Tube BID  . enoxaparin (LOVENOX) injection  40 mg Subcutaneous Q24H  . free water  30 mL Per Tube Q4H  . insulin aspart  0-15 Units Subcutaneous Q4H  . insulin glargine  15 Units  Subcutaneous BID  . ipratropium-albuterol  3 mL Nebulization Q6H  . memantine  10 mg Per Tube BID  . metoprolol tartrate  25 mg Per Tube BID  . polyethylene glycol  17 g Per Tube Daily  . senna-docusate  1 tablet Per Tube Daily   Continuous Infusions: . sodium chloride 10 mL/hr (08/13/18 2049)  . sodium chloride 75 mL/hr at 08/25/18 1819  . ampicillin-sulbactam (UNASYN) IV 3 g (08/26/18 0852)  . dextrose 5 % and 0.45% NaCl 100 mL/hr at 08/26/18 0700  . feeding supplement (GLUCERNA 1.5 CAL) 1,000 mL (08/26/18 0917)   PRN Meds:.sodium chloride, acetaminophen, acetaminophen **OR** [DISCONTINUED] acetaminophen, albuterol, [DISCONTINUED] ondansetron **OR** ondansetron (ZOFRAN) IV   Assessment: 63 year old male admitted for pneumonia requiring intubation, extubation, with altered mental status from baseline, with GI consulted for PEG tube placement   Plan: Patient's family had initially refused PEG tube placement and extensive discussion took place with family and Dr. Allen Norris again yesterday. As per Dr.  Wohl, the family would now like PEG tube placement. PEG tube placement to be done on Monday by Dr. Marius Ditch, and person who would be consenting for it would be Cecille Rubin, phone [phone]  N.p.o. past midnight on Sunday night Please hold  Lovenox on Monday morning prior to procedure.  Liver enzymes improving Continue daily cmp Avoid hepatotoxic drugs    LOS: 34 days   Maurice Antigua, MD 08/26/2018, 11:09 AM

## 2018-08-26 NOTE — Progress Notes (Signed)
Patient ID: Maurice Patterson, male   DOB: 03/21/1956, 63 y.o.   MRN: [id number]  [employer] PROGRESS NOTE  JEP DYAS YFV:[id number] DOB: July 24, 1956 DOA: 07/22/2018 PCP: [name], MD  HPI/Subjective: Patient opens his eyes for a few minutes and I tried to talk with him but he went back to sleep.  Objective: Vitals:   08/26/18 1300 08/26/18 1400  BP: (!) 146/74 (!) 138/121  Pulse: 78 78  Resp:    Temp:    SpO2: 99% 100%    Filed Weights   08/25/18 0500 08/25/18 1516 08/26/18 0334  Weight: 60.6 kg 61.1 kg 60.1 kg    ROS: Review of Systems  Unable to perform ROS: Acuity of condition   Exam: Physical Exam  Constitutional: He is oriented to person, place, and time.  HENT:  Nose: No mucosal edema.  Mouth/Throat: No oropharyngeal exudate.  Dry mouth  Eyes: Pupils are equal, round, and reactive to light. Conjunctivae and lids are normal.  Neck: Carotid bruit is present. No thyromegaly present.  Cardiovascular: S1 normal and S2 normal.  Murmur heard. Respiratory: He has no decreased breath sounds. He has no wheezes. He has no rhonchi. He has no rales.  GI: Soft. Bowel sounds are normal. There is no abdominal tenderness.  Musculoskeletal:     Right ankle: He exhibits no swelling.     Left ankle: He exhibits no swelling.  Neurological: He is alert and oriented to person, place, and time.  Skin: Skin is warm. No rash noted.  Psychiatric:  Awakened and went back to sleep too quickly to assess.      Data Reviewed: Basic Metabolic Panel: Recent Labs  Lab 08/22/18 0316 08/24/18 0311 08/25/18 0032 08/25/18 0404 08/26/18 0551  NA 146* 140 139 141 140  K 3.5 3.7 4.5 4.3 3.4*  CL 110 107 103 105 108  CO2 27 26 28 26 26   GLUCOSE 182* 138* 123* 142* 144*  BUN 19 16 17 18 14   CREATININE 0.70 0.59* 0.70 0.86 0.68  CALCIUM 8.8* 8.4* 8.8* 8.6* 8.0*  MG  --   --   --   --  2.0  PHOS  --   --   --   --  2.6   Liver Function Tests: Recent Labs  Lab  08/22/18 0316 08/23/18 1018 08/24/18 0311 08/25/18 0404 08/26/18 0551  AST 861* 1,064* 380* 103* 43*  ALT 804* 1,575* 1,121* 683* 383*  ALKPHOS 191* 255* 239* 236* 182*  BILITOT 0.6 0.6 0.6 0.7 0.7  PROT 6.4* 6.4* 6.0* 6.5 6.2*  ALBUMIN 3.1* 3.1* 3.1* 3.2* 3.0*    Recent Labs  Lab 08/22/18 0316 08/23/18 1327  AMMONIA 38* 34   CBC: Recent Labs  Lab 08/22/18 0316 08/24/18 0311 08/25/18 0032 08/26/18 0551  WBC 7.2 6.5 11.0* 9.9  NEUTROABS  --   --   --  7.5  HGB 12.9* 13.1 14.1 12.2*  HCT 41.2 41.6 43.4 37.8*  MCV 95.2 94.5 93.5 93.8  PLT 213 189 196 123*   BNP (last 3 results) Recent Labs    07/22/18 2239 08/03/18 1120  BNP 40.0 95.0    CBG: Recent Labs  Lab 08/25/18 2346 08/26/18 0314 08/26/18 0749 08/26/18 1141 08/26/18 1620  GLUCAP 155* 152* 133* 137* 164*    Recent Results (from the past 240 hour(s))  CULTURE, BLOOD (ROUTINE X 2) w Reflex to ID Panel     Status: None (Preliminary result)   Collection Time: 08/25/18 12:32 AM  Result  Value Ref Range Status   Specimen Description BLOOD RIGHT FATTY CASTS  Final   Special Requests   Final    BOTTLES DRAWN AEROBIC AND ANAEROBIC Blood Culture adequate volume   Culture   Final    NO GROWTH 1 DAY Performed at [facility], [address] [place], [place] [address]    Report Status PENDING  Incomplete  CULTURE, BLOOD (ROUTINE X 2) w Reflex to ID Panel     Status: None (Preliminary result)   Collection Time: 08/25/18 12:32 AM  Result Value Ref Range Status   Specimen Description BLOOD RIGHT HAND  Final   Special Requests   Final    BOTTLES DRAWN AEROBIC AND ANAEROBIC Blood Culture adequate volume   Culture   Final    NO GROWTH 1 DAY Performed at [facility], [address] [place] [address]    Report Status PENDING  Incomplete  Culture, respiratory     Status: None (Preliminary result)   Collection Time: 08/25/18  6:13 AM  Result Value Ref Range Status   Specimen  Description   Final    TRACHEAL ASPIRATE Performed at [facility], [address], [place], [place] [address]    Special Requests   Final    NONE Performed at [facility], [address] [place], [place] [address]    Gram Stain   Final    MODERATE WBC PRESENT, PREDOMINANTLY PMN RARE SQUAMOUS EPITHELIAL CELLS PRESENT FEW GRAM POSITIVE COCCI RARE YEAST    Culture   Final    CULTURE REINCUBATED FOR BETTER GROWTH Performed at [facility], [place] [address], [place], [place] [address]    Report Status PENDING  Incomplete     Studies: Dg Abd 1 View  Result Date: 08/25/2018 CLINICAL DATA:  NG tube placement. EXAM: ABDOMEN - 1 VIEW COMPARISON:  08/17/2018 FINDINGS: The feeding tube has been removed. A small caliber enteric tube has been placed and terminates over the proximal stomach with side hole below the diaphragm. Gas and a moderate amount of stool are present in the colon. No dilated loops of bowel are seen to suggest obstruction. The lungs were more fully evaluated on today's earlier chest radiograph. IMPRESSION: Enteric tube terminates over the proximal stomach. Electronically Signed   By: Logan Bores M.D.   On: 08/25/2018 17:25   Ct Head Wo Contrast  Result Date: 08/25/2018 CLINICAL DATA:  Altered mental status (AMS), unclear cause EXAM: CT HEAD WITHOUT CONTRAST TECHNIQUE: Contiguous axial images were obtained from the base of the skull through the vertex without intravenous contrast. COMPARISON:  Head CT 08/08/2018 FINDINGS: Brain: Unchanged atrophy and chronic small vessel ischemia. No intracranial hemorrhage, mass effect, or midline shift. No hydrocephalus. The basilar cisterns are patent. No evidence of territorial infarct or acute ischemia. No extra-axial or intracranial fluid collection. Vascular: Atherosclerosis of skullbase vasculature without hyperdense vessel or abnormal calcification. Skull: No fracture or focal lesion. Sinuses/Orbits: Paranasal  sinuses and mastoid air cells are clear. The visualized orbits are unremarkable. Other: None. IMPRESSION: 1. No acute intracranial abnormality. 2. Unchanged atrophy and chronic small vessel ischemia. Electronically Signed   By: Keith Rake M.D.   On: 08/25/2018 02:17   Dg Chest Port 1 View  Result Date: 08/25/2018 CLINICAL DATA:  Assess for aspiration. EXAM: PORTABLE CHEST 1 VIEW COMPARISON:  Chest radiograph performed 08/17/2018 FINDINGS: The lungs are well-aerated. Minimal bibasilar opacities may reflect atelectasis or possibly mild pneumonia; given clinical concern, aspiration cannot  be entirely excluded. Peribronchial thickening is noted. There is no evidence of pleural effusion or pneumothorax. The cardiomediastinal silhouette is within normal limits. No acute osseous abnormalities are seen. A large chronic sclerotic lesion is noted at the left mid humerus. IMPRESSION: Minimal bibasilar opacities may reflect atelectasis or possibly mild pneumonia; given clinical concern, aspiration cannot be entirely excluded. Peribronchial thickening noted. Electronically Signed   By: Garald Balding M.D.   On: 08/25/2018 00:48   [place] Ekg Site Rite  Result Date: 08/25/2018 If Site Rite image not attached, placement could not be confirmed due to current cardiac rhythm.   Scheduled Meds: . aspirin  81 mg Per Tube Daily  . budesonide (PULMICORT) nebulizer solution  0.25 mg Nebulization BID  . chlorhexidine  15 mL Mouth/Throat BID  . donepezil  10 mg Per Tube BID  . enoxaparin (LOVENOX) injection  40 mg Subcutaneous Q24H  . free water  30 mL Per Tube Q4H  . insulin aspart  0-15 Units Subcutaneous Q4H  . insulin glargine  15 Units Subcutaneous BID  . ipratropium-albuterol  3 mL Nebulization Q6H  . memantine  10 mg Per Tube BID  . metoprolol tartrate  25 mg Per Tube BID  . polyethylene glycol  17 g Per Tube Daily  . senna-docusate  1 tablet Per Tube Daily   Continuous Infusions: . sodium chloride 10 mL/hr  (08/13/18 2049)  . sodium chloride 75 mL/hr at 08/25/18 1819  . ampicillin-sulbactam (UNASYN) IV Stopped (08/26/18 0924)  . dextrose 5 % and 0.45% NaCl 100 mL/hr at 08/26/18 1400  . feeding supplement (GLUCERNA 1.5 CAL) 40 mL/hr at 08/26/18 1400    Assessment/Plan:  1. Sepsis with aspiration pneumonia.  On Unasyn 2. Acute encephalopathy.  Family wants to limit medications as much as possible.  MRI of the brain negative for acute process.  EEG generalized slowing.  LP negative.  As per family does talk to family members.  I I was trying to talk with him today but he fell back asleep pretty easily. 3. Elevated liver function test seem to be trending better 4. Dysphasia with worsening encephalopathy.  PEG tube on Monday 5. Hypokalemia and hyponatremia replacement. 6. Family states he does not have dementia but he is on Aricept and Namenda 7. Type 2 diabetes on glargine insulin and sliding scale 8. Tachycardia on metoprolol  Code Status:     Code Status Orders  (From admission, onward)         Start     Ordered   07/23/18 0123  Full code  Continuous     07/23/18 0122        Code Status History    Date Active Date Inactive Code Status Order ID Comments User Context   04/05/2016 0304 04/05/2016 1851 Full Code [id number]  Harvie Bridge, DO Inpatient   10/19/2015 0103 10/20/2015 1102 Full Code [id number]  Harrie Foreman, MD Inpatient   08/21/2015 1912 08/23/2015 1657 Full Code [id number]  Gladstone Lighter, MD Inpatient   02/19/2015 1627 02/20/2015 2054 Full Code [id number]  Henreitta Leber, MD Inpatient     Family Communication: Son at the bedside Disposition Plan: Family would like to take him home.  Physical therapy recommending rehab  Consultants:  Gastroenterology  Antibiotics:  Unasyn  Time spent: 38 minutes  [place]

## 2018-08-26 NOTE — Progress Notes (Signed)
Patient continues to be on D5 1/2 NS, 2L [place], and tube feeds still off. Wife, who was at bedside states she was told patient would continue to be on tube feeds until PEG placement. She requests to be notified whether or not patient will be receiving nutrition until then. Patient was arousable and able to follow simple commands and attempting to communicate with this RN throughout the shift. Patient continues to have thick, tan colored secretions requiring numerous suctioning. Urine output adequate. Will continue to monitor.

## 2018-08-26 NOTE — Plan of Care (Signed)
Patient turned Q2H this shift, family at bedside much of the day, UOP adequate, condom cath leaks so complete measurement is not possible, tube feeds restarted, currently at 40 cc/hr.  Pt w/2 liters [name]

## 2018-08-27 LAB — COMPREHENSIVE METABOLIC PANEL
ALT: 251 U/L — ABNORMAL HIGH (ref 0–44)
AST: 52 U/L — ABNORMAL HIGH (ref 15–41)
Albumin: 2.7 g/dL — ABNORMAL LOW (ref 3.5–5.0)
Alkaline Phosphatase: 146 U/L — ABNORMAL HIGH (ref 38–126)
Anion gap: 5 (ref 5–15)
BUN: 12 mg/dL (ref 8–23)
CO2: 26 mmol/L (ref 22–32)
Calcium: 7.7 mg/dL — ABNORMAL LOW (ref 8.9–10.3)
Chloride: 108 mmol/L (ref 98–111)
Creatinine, Ser: 0.41 mg/dL — ABNORMAL LOW (ref 0.61–1.24)
GFR calc Af Amer: >60 mL/min (ref >60)
GFR calc non Af Amer: >60 mL/min (ref >60)
Glucose, Bld: 130 mg/dL — ABNORMAL HIGH (ref 70–99)
POTASSIUM: 3.1 mmol/L — AB (ref 3.5–5.1)
Sodium: 139 mmol/L (ref 135–145)
Total Bilirubin: 0.3 mg/dL (ref 0.3–1.2)
Total Protein: 5.5 g/dL — ABNORMAL LOW (ref 6.5–8.1)

## 2018-08-27 LAB — CBC
HEMATOCRIT: 33.1 % — AB (ref 39.0–52.0)
HEMOGLOBIN: 10.5 g/dL — AB (ref 13.0–17.0)
MCH: 30.5 pg (ref 26.0–34.0)
MCHC: 31.7 g/dL (ref 30.0–36.0)
MCV: 96.2 fL (ref 80.0–100.0)
Platelets: 105 10*3/uL — ABNORMAL LOW (ref 150–400)
RBC: 3.44 MIL/uL — ABNORMAL LOW (ref 4.22–5.81)
RDW: 17.1 % — ABNORMAL HIGH (ref 11.5–15.5)
WBC: 6.1 10*3/uL (ref 4.0–10.5)
nRBC: 0 % (ref 0.0–0.2)

## 2018-08-27 LAB — GLUCOSE, CAPILLARY
GLUCOSE-CAPILLARY: 109 mg/dL — AB (ref 70–99)
GLUCOSE-CAPILLARY: 149 mg/dL — AB (ref 70–99)
Glucose-Capillary: 106 mg/dL — ABNORMAL HIGH (ref 70–99)
Glucose-Capillary: 108 mg/dL — ABNORMAL HIGH (ref 70–99)
Glucose-Capillary: 121 mg/dL — ABNORMAL HIGH (ref 70–99)
Glucose-Capillary: 134 mg/dL — ABNORMAL HIGH (ref 70–99)

## 2018-08-27 LAB — CULTURE, RESPIRATORY W GRAM STAIN: Culture: NORMAL

## 2018-08-27 LAB — CULTURE, RESPIRATORY

## 2018-08-27 MED ORDER — GLUCERNA 1.5 CAL PO LIQD: 1000.0000 mL | ORAL | Status: DC

## 2018-08-27 MED ORDER — IPRATROPIUM-ALBUTEROL 0.5-2.5 (3) MG/3ML IN SOLN
3.0000 mL | Freq: Three times a day (TID) | RESPIRATORY_TRACT | Status: DC
  Administered 2018-08-27 – 2018-09-08 (×34): 3 mL via RESPIRATORY_TRACT
  Filled 2018-08-27 (×35): qty 3

## 2018-08-27 MED ORDER — FREE WATER
180.0000 mL | Status: DC
  Administered 2018-08-27 – 2018-09-08 (×65): 180 mL

## 2018-08-27 MED ORDER — POTASSIUM CHLORIDE 10 MEQ/100ML IV SOLN
10.0000 meq | INTRAVENOUS | Status: AC
  Administered 2018-08-27 (×3): 10 meq via INTRAVENOUS
  Filled 2018-08-27 (×3): qty 100

## 2018-08-27 MED ORDER — GLUCERNA 1.5 CAL PO LIQD
1000.0000 mL | ORAL | Status: DC
  Administered 2018-08-27: 1000 mL

## 2018-08-27 MED ORDER — INSULIN GLARGINE 100 UNIT/ML ~~LOC~~ SOLN
7.0000 [IU] | Freq: Two times a day (BID) | SUBCUTANEOUS | Status: DC
  Administered 2018-08-27 – 2018-08-28 (×2): 7 [IU] via SUBCUTANEOUS
  Filled 2018-08-27 (×4): qty 0.07

## 2018-08-27 NOTE — Progress Notes (Signed)
Patient ID: Maurice Patterson, male   DOB: 06-19-1956, 63 y.o.   MRN: [id number]  [employer] PROGRESS NOTE  Maurice Patterson:[id number] DOB: 05-Jan-1956 DOA: 07/22/2018 PCP: Maurice Curry, MD  HPI/Subjective: Patient able to answer some yes or no questions today.  Patient answers no to all questions I asked him.  Objective: Vitals:   08/27/18 1200 08/27/18 1300  BP: 116/75 113/72  Pulse: 85 76  Resp: (!) 21 20  Temp: 98.1 F (36.7 C)   SpO2: 98% 99%    Filed Weights   08/25/18 1516 08/26/18 0334 08/27/18 0436  Weight: 61.1 kg 60.1 kg 60.9 kg    ROS: Review of Systems  Unable to perform ROS: Acuity of condition  Respiratory: Negative for shortness of breath.   Cardiovascular: Negative for chest pain.  Gastrointestinal: Negative for abdominal pain, constipation, diarrhea, nausea and vomiting.  Musculoskeletal: Negative for joint pain.   Exam: Physical Exam  HENT:  Nose: No mucosal edema.  Mouth/Throat: No oropharyngeal exudate.  Dry mouth  Eyes: Pupils are equal, round, and reactive to light. Conjunctivae and lids are normal.  Neck: Carotid bruit is present. No thyromegaly present.  Cardiovascular: S1 normal and S2 normal.  Murmur heard. Respiratory: He has decreased breath sounds in the right lower field and the left lower field. He has no wheezes. He has no rhonchi. He has no rales.  GI: Soft. Bowel sounds are normal. There is no abdominal tenderness.  Musculoskeletal:     Right ankle: He exhibits no swelling.     Left ankle: He exhibits no swelling.  Neurological: He is alert.  Today able to straight leg raise without a problem and lift up his arms up off the bed to command.  Skin: Skin is warm. No rash noted.  Psychiatric:  Awakened and was able to answer some questions in a      Data Reviewed: Basic Metabolic Panel: Recent Labs  Lab 08/24/18 0311 08/25/18 0032 08/25/18 0404 08/26/18 0551 08/27/18 0352  NA 140 139 141 140 139  K 3.7 4.5  4.3 3.4* 3.1*  CL 107 103 105 108 108  CO2 26 28 26 26 26   GLUCOSE 138* 123* 142* 144* 130*  BUN 16 17 18 14 12   CREATININE 0.59* 0.70 0.86 0.68 0.41*  CALCIUM 8.4* 8.8* 8.6* 8.0* 7.7*  MG  --   --   --  2.0  --   PHOS  --   --   --  2.6  --    Liver Function Tests: Recent Labs  Lab 08/23/18 1018 08/24/18 0311 08/25/18 0404 08/26/18 0551 08/27/18 0352  AST 1,064* 380* 103* 43* 52*  ALT 1,575* 1,121* 683* 383* 251*  ALKPHOS 255* 239* 236* 182* 146*  BILITOT 0.6 0.6 0.7 0.7 0.3  PROT 6.4* 6.0* 6.5 6.2* 5.5*  ALBUMIN 3.1* 3.1* 3.2* 3.0* 2.7*    Recent Labs  Lab 08/22/18 0316 08/23/18 1327  AMMONIA 38* 34   CBC: Recent Labs  Lab 08/22/18 0316 08/24/18 0311 08/25/18 0032 08/26/18 0551 08/27/18 0352  WBC 7.2 6.5 11.0* 9.9 6.1  NEUTROABS  --   --   --  7.5  --   HGB 12.9* 13.1 14.1 12.2* 10.5*  HCT 41.2 41.6 43.4 37.8* 33.1*  MCV 95.2 94.5 93.5 93.8 96.2  PLT 213 189 196 123* 105*   BNP (last 3 results) Recent Labs    07/22/18 2239 08/03/18 1120  BNP 40.0 95.0    CBG: Recent Labs  Lab  08/26/18 2123 08/26/18 2316 08/27/18 0323 08/27/18 0729 08/27/18 1149  GLUCAP 188* 202* 109* 106* 121*    Recent Results (from the past 240 hour(s))  CULTURE, BLOOD (ROUTINE X 2) w Reflex to ID Panel     Status: None (Preliminary result)   Collection Time: 08/25/18 12:32 AM  Result Value Ref Range Status   Specimen Description BLOOD RIGHT FATTY CASTS  Final   Special Requests   Final    BOTTLES DRAWN AEROBIC AND ANAEROBIC Blood Culture adequate volume   Culture   Final    NO GROWTH 2 DAYS Performed at [facility], [address], [place], [place] [address]    Report Status PENDING  Incomplete  CULTURE, BLOOD (ROUTINE X 2) w Reflex to ID Panel     Status: None (Preliminary result)   Collection Time: 08/25/18 12:32 AM  Result Value Ref Range Status   Specimen Description BLOOD RIGHT HAND  Final   Special Requests   Final    BOTTLES DRAWN AEROBIC AND  ANAEROBIC Blood Culture adequate volume   Culture   Final    NO GROWTH 2 DAYS Performed at [facility], [address], [place], [place] [address]    Report Status PENDING  Incomplete  Culture, respiratory     Status: None   Collection Time: 08/25/18  6:13 AM  Result Value Ref Range Status   Specimen Description   Final    TRACHEAL ASPIRATE Performed at [facility], [address] [place], [place] [address]    Special Requests   Final    NONE Performed at [facility], [address] [place], [place] [address]    Gram Stain   Final    MODERATE WBC PRESENT, PREDOMINANTLY PMN RARE SQUAMOUS EPITHELIAL CELLS PRESENT FEW GRAM POSITIVE COCCI RARE YEAST    Culture   Final    MODERATE Consistent with normal respiratory flora. Performed at [facility], [place] [address] [place], [place] [address]    Report Status 08/27/2018 FINAL  Final     Studies: Dg Abd 1 View  Result Date: 08/25/2018 CLINICAL DATA:  NG tube placement. EXAM: ABDOMEN - 1 VIEW COMPARISON:  08/17/2018 FINDINGS: The feeding tube has been removed. A small caliber enteric tube has been placed and terminates over the proximal stomach with side hole below the diaphragm. Gas and a moderate amount of stool are present in the colon. No dilated loops of bowel are seen to suggest obstruction. The lungs were more fully evaluated on today's earlier chest radiograph. IMPRESSION: Enteric tube terminates over the proximal stomach. Electronically Signed   By: Logan Bores M.D.   On: 08/25/2018 17:25   [place] Ekg Site Rite  Result Date: 08/25/2018 If Site Rite image not attached, placement could not be confirmed due to current cardiac rhythm.   Scheduled Meds: . aspirin  81 mg Per Tube Daily  . budesonide (PULMICORT) nebulizer solution  0.25 mg Nebulization BID  . chlorhexidine  15 mL Mouth/Throat BID  . donepezil  10 mg Per Tube BID  . free water  180 mL Per Tube Q4H  . insulin aspart  0-15 Units  Subcutaneous Q4H  . insulin glargine  15 Units Subcutaneous BID  . ipratropium-albuterol  3 mL Nebulization TID  . memantine  10 mg Per Tube BID  . metoprolol tartrate  25 mg Per Tube BID  . polyethylene glycol  17 g Per Tube Daily  . senna-docusate  1 tablet Per Tube  Daily   Continuous Infusions: . sodium chloride 1,000 mL (08/27/18 0843)  . ampicillin-sulbactam (UNASYN) IV Stopped (08/27/18 0914)  . dextrose 5 % and 0.45% NaCl 100 mL/hr at 08/26/18 1904  . feeding supplement (GLUCERNA 1.5 CAL) 55 mL/hr at 08/27/18 1200    Assessment/Plan:  1. Sepsis with aspiration pneumonia.  On Unasyn 2. Acute encephalopathy.  Family wants to limit medications as much as possible.  MRI of the brain negative for acute process.  EEG generalized slowing.  LP negative.  Patient able to answer more questions today. 3. Elevated liver function test.  AST and ALT very elevated on presentation and now trending in the right direction. 4. Dysphasia with worsening encephalopathy.  PEG tube on Monday. 5. Hypokalemia and hyponatremia.  Potassium runs today. 6. Family states he does not have dementia but he is on Aricept and Namenda 7. Type 2 diabetes on glargine insulin and sliding scale.  Since will be n.p.o. after midnight I will do half dose Lantus tonight and hold tomorrow morning before PEG. 8. Hypertension and on metoprolol  Code Status:     Code Status Orders  (From admission, onward)         Start     Ordered   07/23/18 0123  Full code  Continuous     07/23/18 0122        Code Status History    Date Active Date Inactive Code Status Order ID Comments User Context   04/05/2016 0304 04/05/2016 1851 Full Code [id number]  Harvie Bridge, DO Inpatient   10/19/2015 0103 10/20/2015 1102 Full Code [id number]  Harrie Foreman, MD Inpatient   08/21/2015 1912 08/23/2015 1657 Full Code [id number]  Gladstone Lighter, MD Inpatient   02/19/2015 1627 02/20/2015 2054 Full Code [id number]  Henreitta Leber, MD  Inpatient     Family Communication: Son yesterday Disposition Plan: Family would like to take him home.  Physical therapy recommending rehab.  Consultants:  Gastroenterology  Antibiotics:  Unasyn  Time spent: 24 minutes  [place]

## 2018-08-27 NOTE — Progress Notes (Addendum)
Vonda Antigua, MD [address] [place], [place], [place], [address] [address], [place], [place], [place], [address] Phone: [phone]  Fax: [phone]   Subjective:  Patient tolerating tube feeds well.  Laying in bed comfortably.  No complaints.  Awakes to name, but not very conversive.  Objective: Exam: Vital signs in last 24 hours: Vitals:   08/27/18 0400 08/27/18 0436 08/27/18 0500 08/27/18 0849  BP: (!) 144/76  138/65   Pulse: 79  82 82  Resp: 20  15 (!) 21  Temp: 98.4 F (36.9 C)     TempSrc: Oral     SpO2: 100%  100% 97%  Weight:  60.9 kg    Height:       Weight change: -0.2 kg  Intake/Output Summary (Last 24 hours) at 08/27/2018 4196 Last data filed at 08/27/2018 2229 Gross per 24 hour  Intake 3013.18 ml  Output 280 ml  Net 2733.18 ml    General: No acute distress Abd: Soft, NT/ND, No HSM Skin: Warm, no rashes Neck: Supple, Trachea midline   Lab Results: Lab Results  Component Value Date   WBC 6.1 08/27/2018   HGB 10.5 (L) 08/27/2018   HCT 33.1 (L) 08/27/2018   MCV 96.2 08/27/2018   PLT 105 (L) 08/27/2018   Micro Results: Recent Results (from the past 240 hour(s))  CULTURE, BLOOD (ROUTINE X 2) w Reflex to ID Panel     Status: None (Preliminary result)   Collection Time: 08/25/18 12:32 AM  Result Value Ref Range Status   Specimen Description BLOOD RIGHT FATTY CASTS  Final   Special Requests   Final    BOTTLES DRAWN AEROBIC AND ANAEROBIC Blood Culture adequate volume   Culture   Final    NO GROWTH 2 DAYS Performed at [facility], [address] [place], [place] [address]    Report Status PENDING  Incomplete  CULTURE, BLOOD (ROUTINE X 2) w Reflex to ID Panel     Status: None (Preliminary result)   Collection Time: 08/25/18 12:32 AM  Result Value Ref Range Status   Specimen Description BLOOD RIGHT HAND  Final   Special Requests   Final    BOTTLES DRAWN AEROBIC AND ANAEROBIC Blood Culture adequate volume   Culture    Final    NO GROWTH 2 DAYS Performed at [facility] [address] [place], [place] [address]    Report Status PENDING  Incomplete  Culture, respiratory     Status: None (Preliminary result)   Collection Time: 08/25/18  6:13 AM  Result Value Ref Range Status   Specimen Description   Final    TRACHEAL ASPIRATE Performed at [facility], [address], [place], [place] [address]    Special Requests   Final    NONE Performed at [facility], [address] [place], [place] [address]    Gram Stain   Final    MODERATE WBC PRESENT, PREDOMINANTLY PMN RARE SQUAMOUS EPITHELIAL CELLS PRESENT FEW GRAM POSITIVE COCCI RARE YEAST    Culture   Final    CULTURE REINCUBATED FOR BETTER GROWTH Performed at [facility], [address], [place], [place] [address]    Report Status PENDING  Incomplete   Studies/Results: Dg Abd 1 View  Result Date: 08/25/2018 CLINICAL DATA:  NG tube placement. EXAM: ABDOMEN - 1 VIEW COMPARISON:  08/17/2018 FINDINGS: The feeding tube has been removed. A small caliber enteric tube has been placed and terminates over the proximal  stomach with side hole below the diaphragm. Gas and a moderate amount of stool are present in the colon. No dilated loops of bowel are seen to suggest obstruction. The lungs were more fully evaluated on today's earlier chest radiograph. IMPRESSION: Enteric tube terminates over the proximal stomach. Electronically Signed   By: Logan Bores M.D.   On: 08/25/2018 17:25   [place] Ekg Site Rite  Result Date: 08/25/2018 If Site Rite image not attached, placement could not be confirmed due to current cardiac rhythm.  Medications:  Scheduled Meds: . aspirin  81 mg Per Tube Daily  . budesonide (PULMICORT) nebulizer solution  0.25 mg Nebulization BID  . chlorhexidine  15 mL Mouth/Throat BID  . donepezil  10 mg Per Tube BID  . enoxaparin (LOVENOX) injection  40 mg Subcutaneous Q24H  . free water  30 mL Per Tube Q4H    . insulin aspart  0-15 Units Subcutaneous Q4H  . insulin glargine  15 Units Subcutaneous BID  . ipratropium-albuterol  3 mL Nebulization Q6H  . memantine  10 mg Per Tube BID  . metoprolol tartrate  25 mg Per Tube BID  . polyethylene glycol  17 g Per Tube Daily  . senna-docusate  1 tablet Per Tube Daily   Continuous Infusions: . sodium chloride 1,000 mL (08/27/18 0843)  . ampicillin-sulbactam (UNASYN) IV 3 g (08/27/18 0844)  . dextrose 5 % and 0.45% NaCl 100 mL/hr at 08/26/18 1904  . feeding supplement (GLUCERNA 1.5 CAL)    . potassium chloride     PRN Meds:.sodium chloride, acetaminophen, acetaminophen **OR** [DISCONTINUED] acetaminophen, albuterol, [DISCONTINUED] ondansetron **OR** ondansetron (ZOFRAN) IV   Assessment: Principal Problem:   CAP (community acquired pneumonia) Active Problems:   Uncontrolled type 2 diabetes mellitus with hyperglycemia, with long-term current use of insulin (HCC)   Essential hypertension   Alzheimer's dementia (HCC)   GERD (gastroesophageal reflux disease)   Fever   Acute respiratory failure (HCC)   Dysphagia   Aspiration pneumonia (HCC)   Palliative care encounter   Pressure injury of skin    Plan: PEG placement planned for tomorrow with Dr. Terri Piedra N.p.o. past midnight Hold Lovenox dose tomorrow morning Okay to continue dobhoff tube feeds till midnight As per Dr. Allen Norris, extensive conversation took place with family and they would like to proceed with PEG tube placement, which is planned for tomorrow.  Liver enzymes improving Continue daily CMP, avoid hepatotoxic drugs Etiology of increased liver enzymes was likely due to hypotension from his pneumonia and infections during the admission.   LOS: 35 days   Vonda Antigua, MD 08/27/2018, 9:14 AM

## 2018-08-27 NOTE — Progress Notes (Signed)
Nutrition Follow-up  DOCUMENTATION CODES:   Not applicable  INTERVENTION:  Patient did not tolerate nocturnal feeds well when tried on evening of 1/2. However, could have been stimulation from bath that led to vomiting since nocturnal feeds are at a higher rate. If nocturnal tube feeds are resumed at a later date recommend keeping patient fairly still overnight (except for turns to prevent skin breakdown) and saving baths and other care that involves a lot of stimulation for during the day when tube feeds are off. Recommend resuming tube feeds over 24 hours for now.  Resume Glucerna 1.5 Cal at 55 mL/hr (1320 mL goal daily volume) per tube. Provides 1980 kcal, 109 grams of protein, 1003 mL H2O daily.  Provide free water flush of 180 mL Q4hrs for a total of 2083 mL H2O daily.  Goal TF regimen meets 100% RDIs for vitamins/minerals.  NUTRITION DIAGNOSIS:   Inadequate oral intake related to (acute metabolic encephalopathy) as evidenced by meal completion < 25%.  Ongoing - addressing with TF regimen.  GOAL:   Patient will meet greater than or equal to 90% of their needs  Met with TF regimen.  MONITOR:   PO intake, Supplement acceptance, Labs, Weight trends, Skin, I & O's  REASON FOR ASSESSMENT:   Ventilator, Consult Enteral/tube feeding initiation and management  ASSESSMENT:   63 year old male with PMHx of seizures, dementia, arthritis, HTN, DM, hx TIA, CAD, CHF, HLD, pancreatitis, CKD, hx CVA 08/2017 who is admitted with PNA, acute metabolic encephalopathy with prior history of HSV encephalitis s/p lumbar puncture on 12/4.   Significant events: -Patient was seen by this RD for initial assessment on 12/5. Received consult for TPN initiation. Patient was not an ideal candidate for TPN but patient's family was requesting nutrition be started and team felt he would not keep an NGT in place. TPN was not initiated as PICC line could not be placed. -On evening of 12/7patient developed  progressive respiratory failure with presumptive aspiration. He transferred to ICU and was initially placed on BiPAP but later required intubation. -Patient was extubated on 12/11. -On 12/13 patient went to IR for post-pyloric feeding tube placement. Tip of tube was unable to be advanced into the duodenum so terminated in stomach. -Patient was re-intubated on 12/17 in setting of respiratory failure and worsening unresponsiveness. -Extubated on 12/23. -Following SLP evaluation on 12/31 diet was advanced to dysphagia 1 (puree) with thin liquids (cup only). -On evening of 1/2 (night nocturnal tube feeds were started) patient forcefully vomited during a bath (?too much stimulation) and Dobbhoff tube came out. -Plan is now for PEG tube placement on 1/6.  Patient only ate two meals since calorie count was initiated on 1/2. For lunch on 1/2 patient had 10 small bites of whipped potatoes with gravy, 2 small bites of vanilla ice cream, and 2 fl oz unsweetened iced tea (230 kcal, 2 grams of protein). For dinner on 1/2 patient took 2 bites of food but held it in his mouth and refused to swallow so the bites were removed by RN (0 kcal, 0 grams of protein). No more meals have been provided to patient as he has been lethargic and there is concern for aspiration. Patient now NPO. Will discontinue calorie count. Tube feeds were resumed and patient is tolerating well. Abdomen remains soft. Last BM was a large type 5 on 1/2.  Enteral Access: NGT placed 1/3; terminates in proximal stomach per abdominal x-ray 1/3; 58 cm at right nare  TF: pt  tolerating Glucerna 1.5 Cal at 50 mL/hr (originally started at 40 mL/hr last night and then advanced this AM); free water flush 30 mL Q4hrs  Medications reviewed and include: Novolog 0-15 units Q4hrs, Lantus 15 units BID, Miralax 17 grams dailly, senna-docusate 1 tablet daily per tube, Unasyn.  Labs reviewed: CBG 106-202, Potassium 3.1, Creatinine 0.41.  Discussed with RN.    Diet Order:   Diet Order            Diet NPO time specified Except for: Sips with Meds  Diet effective midnight             EDUCATION NEEDS:   No education needs have been identified at this time  Skin:  Skin Assessment: Skin Integrity Issues:(DTI to sacrum; ecchymosis to bilateral arms)  Last BM:  08/24/2017 - large type 5  Height:   Ht Readings from Last 1 Encounters:  07/29/18 '5\' 6"'$  (1.676 m)   Weight:   Wt Readings from Last 1 Encounters:  08/27/18 60.9 kg   Ideal Body Weight:  70 kg  BMI:  Body mass index is 21.67 kg/m.  Estimated Nutritional Needs:   Kcal:  1845-2150 (MSJ x 1.2-1.4)  Protein:  85-105 grams (1.2-1.5 grams/kg)  Fluid:  >1.9L/day   Willey Blade, MS, RD, LDN Office: [phone] Pager: [phone] After Hours/Weekend Pager: [phone]

## 2018-08-28 ENCOUNTER — Inpatient Hospital Stay: Payer: PPO | Admitting: Anesthesiology

## 2018-08-28 ENCOUNTER — Encounter: Payer: Self-pay | Admitting: Anesthesiology

## 2018-08-28 ENCOUNTER — Encounter
Admission: EM | Disposition: A | Discharge status: Skilled Nursing Facility | Payer: Self-pay | Source: Home / Self Care | Attending: Internal Medicine

## 2018-08-28 HISTORY — PX: PEG PLACEMENT: SHX5437

## 2018-08-28 LAB — GLUCOSE, CAPILLARY
GLUCOSE-CAPILLARY: 112 mg/dL — AB (ref 70–99)
GLUCOSE-CAPILLARY: 128 mg/dL — AB (ref 70–99)
Glucose-Capillary: 102 mg/dL — ABNORMAL HIGH (ref 70–99)
Glucose-Capillary: 122 mg/dL — ABNORMAL HIGH (ref 70–99)

## 2018-08-28 SURGERY — INSERTION, PEG TUBE
Anesthesia: General | Laterality: Left

## 2018-08-28 MED ORDER — SODIUM CHLORIDE 0.9 % IV SOLN
INTRAVENOUS | Status: DC
  Administered 2018-08-28: 12:00:00 via INTRAVENOUS

## 2018-08-28 MED ORDER — CHLORPROMAZINE HCL 10 MG PO TABS
10.0000 mg | ORAL_TABLET | Freq: Three times a day (TID) | ORAL | Status: DC | PRN
  Administered 2018-08-28 – 2018-08-29 (×2): 10 mg via ORAL
  Filled 2018-08-28 (×3): qty 1

## 2018-08-28 MED ORDER — CLINDAMYCIN PHOSPHATE 900 MG/50ML IV SOLN: 900.0000 mg | INTRAVENOUS | Status: DC

## 2018-08-28 MED ORDER — PROPOFOL 500 MG/50ML IV EMUL
INTRAVENOUS | Status: DC | PRN
  Administered 2018-08-28: 50 ug/kg/min via INTRAVENOUS

## 2018-08-28 MED ORDER — PROPOFOL 500 MG/50ML IV EMUL
INTRAVENOUS | Status: AC
  Filled 2018-08-28: qty 50

## 2018-08-28 MED ORDER — FAMOTIDINE IN NACL 20-0.9 MG/50ML-% IV SOLN
20.0000 mg | Freq: Two times a day (BID) | INTRAVENOUS | Status: DC
  Administered 2018-08-28 – 2018-08-31 (×7): 20 mg via INTRAVENOUS
  Filled 2018-08-28 (×7): qty 50

## 2018-08-28 MED ORDER — PROPOFOL 10 MG/ML IV BOLUS
INTRAVENOUS | Status: DC | PRN
  Administered 2018-08-28: 20 mg via INTRAVENOUS
  Administered 2018-08-28: 30 mg via INTRAVENOUS

## 2018-08-28 MED ORDER — SODIUM CHLORIDE 0.9 % IV SOLN
25.0000 mg | Freq: Once | INTRAVENOUS | Status: DC
  Filled 2018-08-28: qty 1

## 2018-08-28 MED ORDER — CLINDAMYCIN PHOSPHATE 900 MG/50ML IV SOLN
INTRAVENOUS | Status: AC
  Administered 2018-08-28: 900 mg
  Filled 2018-08-28: qty 50

## 2018-08-28 NOTE — Progress Notes (Signed)
Report provided to nurse, Carmell Austria. Pt with no noted distress. Peg placement with dressing clean dry and intact.

## 2018-08-28 NOTE — Progress Notes (Addendum)
Neuro: Pt alert and able to follow commands, oriented to person only at this time. Will continue to monitor.   Respiratory: Pt on RA with o2 sats WNL.   Cardiovascular: Pt afebrile, BP WNL and HR normal.   GI/GU: Pt voiding and incontinent. PEG tube placed and pt remains NPO at this time with abd binder in place.    Skin: Skin intact with no new s/s of skin breakdown at this time. Pt remains a Q2hr turn.   Pain: Pt with no reports of pain at this time.   Lines: Pt with PIV in place, no signs or symptoms of complications at this time.   Drips: Pt remains on maintenance fluids at 50 ml/hr.    Events: NO acute events throughout shift. Pts plan of care to continue with current regimen, Family updated regarding plan for pt to get a bed outside of ICU. Pts wife very upset stating that she does not want him to leave ICU. The AC,charge RN, and Administrator of the unit was notified and all agreed that pt was stable and was an appropriate transfer out the ICU at this time. Wife demanding pt be kept in ICU despite his stable condition. The plan at this time is to move pt out once a bed becomes available.

## 2018-08-28 NOTE — Anesthesia Postprocedure Evaluation (Signed)
Anesthesia Post Note  Patient: Maurice Patterson  Procedure(s) Performed: PERCUTANEOUS ENDOSCOPIC GASTROSTOMY (PEG) PLACEMENT (Left )  Patient location during evaluation: Endoscopy Anesthesia Type: General Level of consciousness: awake and alert Pain management: pain level controlled Vital Signs Assessment: post-procedure vital signs reviewed and stable Respiratory status: spontaneous breathing, nonlabored ventilation, respiratory function stable and patient connected to nasal cannula oxygen Cardiovascular status: blood pressure returned to baseline and stable Postop Assessment: no apparent nausea or vomiting Anesthetic complications: no     Last Vitals:  Vitals:   08/28/18 1239 08/28/18 1249  BP: 101/66 115/65  Pulse: 70 68  Resp: (!) 21 (!) 21  Temp:    SpO2: 97% 98%    Last Pain:  Vitals:   08/28/18 1249  TempSrc:   PainSc: Asleep                 Hilliard Borges S

## 2018-08-28 NOTE — Anesthesia Post-op Follow-up Note (Signed)
Anesthesia QCDR form completed.        

## 2018-08-28 NOTE — Op Note (Signed)
[facility] Patient Name: Maurice Patterson Procedure Date: 08/28/2018 11:48 AM MRN: [id number] Account #: [id number] Date of Birth: [birth year] Admit Type: Inpatient Age: 63 Room: [facility] ENDO ROOM 2 Gender: Male Note Status: Finalized Procedure:            Upper GI endoscopy Indications:          Place PEG because patient is unable to eat, Place PEG                        to improve nutrition in patient with prolonged severe                        illness Providers:            Lin Landsman MD, MD Referring MD:         [name] MD, MD (Referring MD) Medicines:            Monitored Anesthesia Care Complications:        No immediate complications. Estimated blood loss: None. Procedure:            Pre-Anesthesia Assessment:                       - Prior to the procedure, a History and Physical was                        performed, and patient medications and allergies were                        reviewed. The patient is unable to give consent                        secondary to the patient's altered mental status. The                        risks and benefits of the procedure and the sedation                        options and risks were discussed with the patient's                        spouse. All questions were answered and informed                        consent was obtained. Patient identification and                        proposed procedure were verified by the physician, the                        nurse, the anesthesiologist, the anesthetist and the                        technician in the pre-procedure area in the procedure                        room in the endoscopy suite. Mental Status Examination:                        alert but confused.  Airway Examination: normal                        oropharyngeal airway and neck mobility. Respiratory                        Examination: bibasilar crackles. CV Examination:      normal. Prophylactic Antibiotics: The patient requires                        prophylactic antibiotics for planned PEG placement. The                        patient received antibiotic therapy today, before the                        procedure started. Prior Anticoagulants: The patient                        has taken Lovenox (enoxaparin), last dose was 1 day                        prior to procedure. ASA Grade Assessment: III - A                        patient with severe systemic disease. After reviewing                        the risks and benefits, the patient was deemed in                        satisfactory condition to undergo the procedure. The                        anesthesia plan was to use monitored anesthesia care                        (MAC). Immediately prior to administration of                        medications, the patient was re-assessed for adequacy                        to receive sedatives. The heart rate, respiratory rate,                        oxygen saturations, blood pressure, adequacy of                        pulmonary ventilation, and response to care were                        monitored throughout the procedure. The physical status                        of the patient was re-assessed after the procedure.                       After obtaining informed consent, the endoscope was  passed under direct vision. Throughout the procedure,                        the patient's blood pressure, pulse, and oxygen                        saturations were monitored continuously. The Endoscope                        was introduced through the mouth, and advanced to the                        second part of duodenum. The upper GI endoscopy was                        accomplished without difficulty. The patient tolerated                        the procedure well. Findings:      The duodenal bulb and second portion of the duodenum were normal.       One non-bleeding superficial gastric ulcer with a clean ulcer base       (Forrest Class III) was found in the gastric body. The lesion was 5 mm       in largest dimension. The patient was placed in the supine position for       PEG placement. The stomach was insufflated to appose gastric and       abdominal walls. A site was located in the body of the stomach with       excellent transillumination for placement. The abdominal wall was marked       and prepped in a sterile manner. The area was anesthetized with 2 mL of       1% lidocaine. The trocar needle was introduced through the abdominal       wall and into the stomach under direct endoscopic view. A snare was       introduced through the endoscope and opened in the gastric lumen. The       guide wire was passed through the trocar and into the open snare. The       snare was closed around the guide wire. The endoscope and snare were       removed, pulling the wire out through the mouth. A skin incision was       made at the site of needle insertion. The externally removable 20 Fr       EndoVive Safety gastrostomy tube was lubricated. The G-tube was tied to       the guide wire and pulled through the mouth and into the stomach. The       trocar needle was removed, and the gastrostomy tube was pulled out from       the stomach through the skin. The external bumper was attached to the       gastrostomy tube, and the tube was cut to remove the guide wire. The       final position of the gastrostomy tube was confirmed by relook       endoscopy, and skin marking noted to be 2 cm at the external bumper. The       final tension and compression of the abdominal wall by the PEG tube and  external bumper were checked and revealed that the bumper was moderately       tight and mildly deforming the skin. The feeding tube was capped, and       the tube site cleaned and dressed. Estimated blood loss: none.      The examined esophagus was normal.       The exam of the stomach was otherwise normal. Impression:           - Normal duodenal bulb and second portion of the                        duodenum.                       - Non-bleeding gastric ulcer with a clean ulcer base                        (Forrest Class III).                       - Normal esophagus.                       - An externally removable PEG placement was                        successfully completed.                       - No specimens collected. Recommendation:       - Return patient to hospital ward for ongoing care.                       - Please follow the post-PEG recommendations including:                        Nutrition consult for formula and volume, external                        bolster snug to abdominal wall, change dressing once                        per day, NPO x4 hrs then water today, may use PEG today                        for meds and water, may use PEG tomorrow for feedings,                        flush PEG daily with 60 ml water, check site for                        bleeding q 4 hrs and clean site with soap and water                        daily and dry thoroughly. Procedure Code(s):    --- Professional ---                       [phone], Esophagogastroduodenoscopy, flexible, transoral;  with directed placement of percutaneous gastrostomy tube Diagnosis Code(s):    --- Professional ---                       K25.9, Gastric ulcer, unspecified as acute or chronic,                        without hemorrhage or perforation                       R63.3, Feeding difficulties                       Z43.1, Encounter for attention to gastrostomy                       E63.9, Nutritional deficiency, unspecified CPT copyright 2018 [employer]. All rights reserved. The codes documented in this report are preliminary and upon coder review may  be revised to meet current compliance requirements. Dr. Ulyess Mort Lin Landsman MD, MD 08/28/2018 37:48:27 PM This report has been signed electronically. Number of Addenda: 0 Note Initiated On: 08/28/2018 11:48 AM      [facility]

## 2018-08-28 NOTE — Anesthesia Preprocedure Evaluation (Signed)
Anesthesia Evaluation  Patient identified by MRN, date of birth, ID band Patient awake    Reviewed: Allergy & Precautions, NPO status , Patient's Chart, lab work & pertinent test results, reviewed documented beta blocker date and time   Airway Mallampati: III  TM Distance: >3 FB     Dental  (+) Chipped   Pulmonary pneumonia, unresolved, former smoker,           Cardiovascular hypertension, Pt. on medications and Pt. on home beta blockers + CAD, + Past MI, + Cardiac Stents and +CHF       Neuro/Psych Seizures -,  PSYCHIATRIC DISORDERS Dementia TIACVA    GI/Hepatic GERD  ,  Endo/Other  diabetes, Type 2  Renal/GU Renal disease     Musculoskeletal  (+) Arthritis ,   Abdominal   Peds  Hematology   Anesthesia Other Findings EKG ok.K 3.1. Hb 10.5. Low platelets. Discussed with Vanga. EF 60-65. Hi risk pt, they feel this has to be done.  Reproductive/Obstetrics                             Anesthesia Physical Anesthesia Plan  ASA: IV  Anesthesia Plan: General   Post-op Pain Management:    Induction: Intravenous  PONV Risk Score and Plan:   Airway Management Planned:   Additional Equipment:   Intra-op Plan:   Post-operative Plan:   Informed Consent: I have reviewed the patients History and Physical, chart, labs and discussed the procedure including the risks, benefits and alternatives for the proposed anesthesia with the patient or authorized representative who has indicated his/her understanding and acceptance.     Plan Discussed with: CRNA  Anesthesia Plan Comments:         Anesthesia Quick Evaluation

## 2018-08-28 NOTE — H&P (Signed)
Maurice Darby, MD [address]  [place]  [place], [place] [address]  Main: [phone]  Fax: [phone] Pager: [phone]  Primary Care Physician:  [name], MD Primary Gastroenterologist:  Dr. Cephas Patterson  Pre-Procedure History & Physical: HPI:  Maurice Patterson is a 62 y.o. male is here for an EGD and PEG tube placement.   Past Medical History:  Diagnosis Date  . Acute MI ([place])   . Arthritis   . Back pain   . CAD (coronary artery disease)   . CHF (congestive heart failure) ([place])   . Chronic kidney disease   . Degenerative lumbar disc   . Dementia ([place])   . Diabetes mellitus without complication ([place])   . GERD (gastroesophageal reflux disease)   . Hernia of abdominal cavity   . Hyperlipemia   . Hypertension   . Malignant intraductal papillary mucinous tumor of pancreas ([place])   . Memory loss   . Pancreatitis   . Seizures ([place])    staring spells  . Shingles   . Stroke ([place]) 08/2017  . TIA (transient ischemic attack)     Past Surgical History:  Procedure Laterality Date  . CARDIAC CATHETERIZATION    . CORONARY ANGIOPLASTY    . CYSTOSCOPY WITH STENT PLACEMENT Right 05/13/2017   Procedure: CYSTOSCOPY WITH STENT PLACEMENT;  Surgeon: Nickie Retort, MD;  Location: [facility] ORS;  Service: Urology;  Laterality: Right;  . ERCP N/A 09/12/2015   Procedure: ENDOSCOPIC RETROGRADE CHOLANGIOPANCREATOGRAPHY (ERCP);  Surgeon: Hulen Luster, MD;  Location: [facility] ENDOSCOPY;  Service: Gastroenterology;  Laterality: N/A;  . ERCP N/A 01/02/2016   Procedure: ENDOSCOPIC RETROGRADE CHOLANGIOPANCREATOGRAPHY (ERCP);  Surgeon: Hulen Luster, MD;  Location: [facility] ENDOSCOPY;  Service: Gastroenterology;  Laterality: N/A;  . EXTERNAL FIXATION WRIST FRACTURE Left   . left arm metal plate    . LEFT HEART CATH AND CORONARY ANGIOGRAPHY Left 11/04/2016   Procedure: Left Heart Cath and Coronary Angiography;  Surgeon: Isaias Cowman, MD;  Location: [facility] CV LAB;  Service:  Cardiovascular;  Laterality: Left;  . Left shoulder surgery    . Stent times 3  2013   Cardiac  . TOTAL ELBOW REPLACEMENT Left   . VASECTOMY    . VENTRICULOPERITONEAL SHUNT      Prior to Admission medications   Medication Sig Start Date End Date Taking? Authorizing Provider  aspirin EC 81 MG tablet Take 81 mg by mouth daily.   Yes [provider]  botulinum toxin Type A (BOTOX) 100 units SOLR injection Inject 100 Units into the muscle every 3 (three) months.   Yes [provider]  cholecalciferol (VITAMIN D) 1000 units tablet Take 1,000 Units by mouth daily.    Yes [provider]  clopidogrel (PLAVIX) 75 MG tablet Take 75 mg by mouth daily.    Yes [provider]  colestipol (COLESTID) 1 g tablet Take 1 g by mouth daily.    Yes [provider]  diphenoxylate-atropine (LOMOTIL) 2.5-0.025 MG tablet Take 1 tablet by mouth 3 (three) times daily as needed for diarrhea or loose stools.   Yes [provider]  donepezil (ARICEPT) 10 MG tablet Take 1 tablet (10 mg total) by mouth 2 (two) times daily. 01/09/18  Yes Ward Givens, NP  furosemide (LASIX) 40 MG tablet Take 40 mg by mouth daily.    Yes [provider]  insulin NPH Human (HUMULIN N,NOVOLIN N) 100 UNIT/ML injection Inject 0.25 mLs (25 Units total) into the skin 2 (  two) times daily. Patient taking differently: Inject 32 Units into the skin 2 (two) times daily.  04/05/16  Yes Sudini, Alveta Heimlich, MD  insulin regular (NOVOLIN R,HUMULIN R) 100 units/mL injection Inject 0.3 mLs (30 Units total) into the skin 3 (three) times daily with meals. Patient taking differently: Inject 34 Units into the skin 3 (three) times daily before meals. 34 units subcutaneous 3 times daily with meals with an additional sliding scale of 8 units added with every 20 glucose reading over 200. 04/05/16  Yes Sudini, Alveta Heimlich, MD  isosorbide mononitrate (IMDUR) 30 MG 24 hr tablet Take 30 mg by mouth daily.  10/07/16  Yes  [provider]  lidocaine (LIDODERM) 5 % Place 1 patch onto the skin daily as needed (pain). Remove & Discard patch within 12 hours or as directed by MD (APPLIED TO PATIENT BACK FOR PAIN)   Yes [provider]  losartan (COZAAR) 100 MG tablet Take 100 mg by mouth daily.   Yes [provider]  memantine (NAMENDA) 10 MG tablet Take 1 tablet (10 mg total) by mouth 2 (two) times daily. 01/09/18  Yes Ward Givens, NP  metoprolol succinate (TOPROL-XL) 50 MG 24 hr tablet Take 50 mg by mouth daily. Take with or immediately following a meal.    Yes [provider]  potassium chloride (K-DUR) 10 MEQ tablet Take 10 mEq by mouth 2 (two) times daily. 07/14/18  Yes [provider]  acetaminophen (TYLENOL) 500 MG tablet Take 500 mg by mouth 3 (three) times daily.    [provider]  lamoTRIgine (LAMICTAL) 100 MG tablet Take 100 mg by mouth 2 (two) times daily.    [provider]  nitroGLYCERIN (NITROSTAT) 0.4 MG SL tablet Place 1 tablet under the tongue every 5 (five) minutes as needed for chest pain.     [provider]  pantoprazole (PROTONIX) 40 MG tablet Take 40 mg by mouth daily.  10/04/16   [provider]    Allergies as of 07/22/2018 - Review Complete 07/22/2018  Allergen Reaction Noted  . Hydrocodone Anaphylaxis 02/19/2015  . Morphine Other (See Comments) 05/27/2015  . Ambien [zolpidem] Other (See Comments) 02/19/2015  . Brilinta [ticagrelor] Other (See Comments) 02/19/2015  . Flexeril [cyclobenzaprine] Other (See Comments) 12/19/2015  . Flunitrazepam Other (See Comments) 11/02/2016  . Haldol [haloperidol lactate] Other (See Comments) 12/19/2015  . Levetiracetam Diarrhea and Other (See Comments) 05/27/2015  . Lorazepam Hives 05/27/2015  . Risperdal [risperidone] Other (See Comments) 12/19/2015  . Trazodone Other (See Comments) 05/27/2015  . Benadryl [diphenhydramine hcl (sleep)] Rash 04/04/2016  . Penicillins Rash  02/19/2015    Family History  Problem Relation Age of Onset  . Diabetes Mother   . Hypertension Mother   . CAD Mother   . Hyperlipidemia Mother   . Stroke Mother   . ALS Mother   . Alzheimer's disease Father   . Diabetes Father   . Heart Problems Brother     Social History   Socioeconomic History  . Marital status: Married    Spouse name: Not on file  . Number of children: 2  . Years of education: BS  . Highest education level: Not on file  Occupational History  . Occupation: Disabled  Social Needs  . Financial resource strain: Not on file  . Food insecurity:    Worry: Not on file    Inability: Not on file  . Transportation needs:    Medical: Not on file    Non-medical: Not on  file  Tobacco Use  . Smoking status: Former Smoker    Last attempt to quit: 2013    Years since quitting: 7.0  . Smokeless tobacco: Never Used  . Tobacco comment: used to smoke 2PD for 40 yrs, quit about 4 years ago  Substance and Sexual Activity  . Alcohol use: No    Alcohol/week: 0.0 standard drinks    Comment: occasional  . Drug use: No  . Sexual activity: Not on file  Lifestyle  . Physical activity:    Days per week: Not on file    Minutes per session: Not on file  . Stress: Not on file  Relationships  . Social connections:    Talks on phone: Not on file    Gets together: Not on file    Attends religious service: Not on file    Active member of club or organization: Not on file    Attends meetings of clubs or organizations: Not on file    Relationship status: Not on file  . Intimate partner violence:    Fear of current or ex partner: Not on file    Emotionally abused: Not on file    Physically abused: Not on file    Forced sexual activity: Not on file  Other Topics Concern  . Not on file  Social History Narrative   Lives at home with wife and son. Ambulatory at baseline.   Right-handed   Caffeine: 2 sodas per day    Review of Systems: See HPI, otherwise negative  ROS  Physical Exam: BP 132/76   Pulse 68   Temp (!) 97.3 F (36.3 C) (Tympanic)   Resp 20   Ht 5\' 6"  (1.676 m)   Wt 60.9 kg   SpO2 97%   BMI 21.67 kg/m  General:   Alert,  pleasant and cooperative in NAD Head:  Normocephalic and atraumatic. Neck:  Supple; no masses or thyromegaly. Lungs:  Clear throughout to auscultation.    Heart:  Regular rate and rhythm. Abdomen:  Soft, nontender and nondistended. Normal bowel sounds, without guarding, and without rebound.   Neurologic:  Alert and  oriented x4;  grossly normal neurologically.  Impression/Plan: Maurice Patterson is here for an EGD to be performed for PEG placement  Risks, benefits, limitations, and alternatives regarding  PEG tube placement have been reviewed with the patient.  Questions have been answered.  All parties agreeable.   Sherri Sear, MD  08/28/2018, 11:50 AM

## 2018-08-28 NOTE — Progress Notes (Signed)
Patient ID: Maurice Patterson, male   DOB: Jun 07, 1956, 63 y.o.   MRN: [id number]  The patient's wife called me and a panic that the patient is going to be transferred out of the ICU.  She mentioned that she has already talked to administration that he will stay physically in an ICU bed until he is discharged.  I spoke with the nursing staff that there has been an order in there for him to go out to the floor and there is no bed yet.  I spoke with Kunta Art in administration and he will speak with the administrator on-call about the situation.  I called back the wife and I mentioned that I do not put in the orders to get out of the ICU.  The critical care team determines this based on patient needs.  The wife mentioned that he almost died on the first floor and needed to be transferred to the ICU in the first place.  She does not want the patient to be transferred out of the ICU.  I spoke with the nursing staff again in the ICU about the situation.  They will have to speak with the critical care team and administrator on-call and then the family for a decision.  Dr. Loletha Grayer

## 2018-08-28 NOTE — Anesthesia Procedure Notes (Signed)
Date/Time: 08/28/2018 12:05 PM Performed by: Johnna Acosta, CRNA Pre-anesthesia Checklist: Patient identified, Emergency Drugs available, Suction available, Patient being monitored and Timeout performed Oxygen Delivery Method: Nasal cannula

## 2018-08-28 NOTE — Transfer of Care (Signed)
Immediate Anesthesia Transfer of Care Note  Patient: Maurice Patterson  Procedure(s) Performed: PERCUTANEOUS ENDOSCOPIC GASTROSTOMY (PEG) PLACEMENT (Left )  Patient Location: PACU  Anesthesia Type:General  Level of Consciousness: drowsy  Airway & Oxygen Therapy: Patient Spontanous Breathing and Patient connected to nasal cannula oxygen  Post-op Assessment: Report given to RN and Post -op Vital signs reviewed and stable  Post vital signs: Reviewed and stable  Last Vitals:  Vitals Value Taken Time  BP 128/75 08/28/2018 12:19 PM  Temp 36.1 C 08/28/2018 12:19 PM  Pulse 74 08/28/2018 12:19 PM  Resp 21 08/28/2018 12:19 PM  SpO2 100 % 08/28/2018 12:19 PM    Last Pain:  Vitals:   08/28/18 1219  TempSrc: Tympanic  PainSc:       Patients Stated Pain Goal: 0 (43/27/61 4709)  Complications: No apparent anesthesia complications

## 2018-08-28 NOTE — Progress Notes (Addendum)
Patient ID: Maurice Patterson, male   DOB: 19-Sep-1955, 63 y.o.   MRN: [id number]  [employer] PROGRESS NOTE  Maurice Patterson:[id number] DOB: October 06, 1955 DOA: 07/22/2018 PCP: [name], MD  HPI/Subjective: Patient seen after PEG tube placement.  Patient opened his eyes and tried to talk but went right back to sleep.  Patient given sedation medications with endoscopy.  Objective: Vitals:   08/28/18 1249 08/28/18 1259  BP: 115/65 109/80  Pulse: 68 71  Resp: (!) 21 20  Temp:    SpO2: 98% 99%    Filed Weights   08/26/18 0334 08/27/18 0436 08/28/18 1114  Weight: 60.1 kg 60.9 kg 60.9 kg    ROS: Review of Systems  Unable to perform ROS: Acuity of condition   Exam: Physical Exam  Constitutional: He appears lethargic.  HENT:  Nose: No mucosal edema.  Mouth/Throat: No oropharyngeal exudate.  Dry mouth  Eyes: Pupils are equal, round, and reactive to light. Conjunctivae and lids are normal.  Neck: No thyromegaly present.  Cardiovascular: S1 normal and S2 normal.  Murmur heard. Respiratory: He has decreased breath sounds in the right lower field and the left lower field. He has no wheezes. He has no rhonchi. He has no rales.  GI: Soft. Bowel sounds are normal. There is no abdominal tenderness.  Musculoskeletal:     Right ankle: He exhibits no swelling.     Left ankle: He exhibits no swelling.  Neurological: He appears lethargic.  Sedated from procedure  Skin: Skin is warm. No rash noted.  Psychiatric:  Sedated from procedure      Data Reviewed: Basic Metabolic Panel: Recent Labs  Lab 08/24/18 0311 08/25/18 0032 08/25/18 0404 08/26/18 0551 08/27/18 0352  NA 140 139 141 140 139  K 3.7 4.5 4.3 3.4* 3.1*  CL 107 103 105 108 108  CO2 26 28 26 26 26   GLUCOSE 138* 123* 142* 144* 130*  BUN 16 17 18 14 12   CREATININE 0.59* 0.70 0.86 0.68 0.41*  CALCIUM 8.4* 8.8* 8.6* 8.0* 7.7*  MG  --   --   --  2.0  --   PHOS  --   --   --  2.6  --    Liver Function  Tests: Recent Labs  Lab 08/23/18 1018 08/24/18 0311 08/25/18 0404 08/26/18 0551 08/27/18 0352  AST 1,064* 380* 103* 43* 52*  ALT 1,575* 1,121* 683* 383* 251*  ALKPHOS 255* 239* 236* 182* 146*  BILITOT 0.6 0.6 0.7 0.7 0.3  PROT 6.4* 6.0* 6.5 6.2* 5.5*  ALBUMIN 3.1* 3.1* 3.2* 3.0* 2.7*    Recent Labs  Lab 08/22/18 0316 08/23/18 1327  AMMONIA 38* 34   CBC: Recent Labs  Lab 08/22/18 0316 08/24/18 0311 08/25/18 0032 08/26/18 0551 08/27/18 0352  WBC 7.2 6.5 11.0* 9.9 6.1  NEUTROABS  --   --   --  7.5  --   HGB 12.9* 13.1 14.1 12.2* 10.5*  HCT 41.2 41.6 43.4 37.8* 33.1*  MCV 95.2 94.5 93.5 93.8 96.2  PLT 213 189 196 123* 105*   BNP (last 3 results) Recent Labs    07/22/18 2239 08/03/18 1120  BNP 40.0 95.0    CBG: Recent Labs  Lab 08/27/18 1656 08/27/18 1953 08/27/18 2345 08/28/18 0400 08/28/18 1131  GLUCAP 108* 149* 134* 112* 102*    Recent Results (from the past 240 hour(s))  CULTURE, BLOOD (ROUTINE X 2) w Reflex to ID Panel     Status: None (Preliminary result)   Collection  Time: 08/25/18 12:32 AM  Result Value Ref Range Status   Specimen Description BLOOD RIGHT FATTY CASTS  Final   Special Requests   Final    BOTTLES DRAWN AEROBIC AND ANAEROBIC Blood Culture adequate volume   Culture   Final    NO GROWTH 3 DAYS Performed at [facility], [address] [place], [place] [address]    Report Status PENDING  Incomplete  CULTURE, BLOOD (ROUTINE X 2) w Reflex to ID Panel     Status: None (Preliminary result)   Collection Time: 08/25/18 12:32 AM  Result Value Ref Range Status   Specimen Description BLOOD RIGHT HAND  Final   Special Requests   Final    BOTTLES DRAWN AEROBIC AND ANAEROBIC Blood Culture adequate volume   Culture   Final    NO GROWTH 3 DAYS Performed at [facility], [address], [place], [place] [address]    Report Status PENDING  Incomplete  Culture, respiratory     Status: None   Collection Time: 08/25/18   6:13 AM  Result Value Ref Range Status   Specimen Description   Final    TRACHEAL ASPIRATE Performed at [facility], [address] [place], [place] [address]    Special Requests   Final    NONE Performed at [facility], [address] [place], [place] [address]    Gram Stain   Final    MODERATE WBC PRESENT, PREDOMINANTLY PMN RARE SQUAMOUS EPITHELIAL CELLS PRESENT FEW GRAM POSITIVE COCCI RARE YEAST    Culture   Final    MODERATE Consistent with normal respiratory flora. Performed at [facility], [place] [address] [place], [place] [address]    Report Status 08/27/2018 FINAL  Final      Scheduled Meds: . aspirin  81 mg Per Tube Daily  . budesonide (PULMICORT) nebulizer solution  0.25 mg Nebulization BID  . chlorhexidine  15 mL Mouth/Throat BID  . donepezil  10 mg Per Tube BID  . free water  180 mL Per Tube Q4H  . insulin aspart  0-15 Units Subcutaneous Q4H  . ipratropium-albuterol  3 mL Nebulization TID  . memantine  10 mg Per Tube BID  . metoprolol tartrate  25 mg Per Tube BID  . polyethylene glycol  17 g Per Tube Daily  . senna-docusate  1 tablet Per Tube Daily   Continuous Infusions: . sodium chloride 0  (08/27/18 1447)  . ampicillin-sulbactam (UNASYN) IV 3 g (08/28/18 0924)  . chlorproMAZINE (THORAZINE) IV    . clindamycin (CLEOCIN) IV    . dextrose 5 % and 0.45% NaCl 50 mL/hr at 08/27/18 2000    Assessment/Plan:  1. Sepsis with aspiration pneumonia.  On Unasyn.  Spoke with pharmacist to put a stop date on this. 2. Acute encephalopathy.  Patient received medications with endoscopy today so may take a day or 2 to clear.  Family wants to limit medications as much as possible.  MRI of the brain negative for acute process.  EEG generalized slowing.  LP negative.  Patient able to answer more questions today. 3. Elevated liver function test.  AST and ALT very elevated on presentation and now trending in the right direction.  Check CMP  tomorrow. 4. Dysphasia with worsening encephalopathy.  PEG tube placed today. 5. Hypokalemia.  Replaced yesterday.  Check again tomorrow. 6. Hyponatremia has improved with IV fluids. 7. Type 2 diabetes.  We will hold glargine insulin today since PEG tube feeding  is on hold.  And sugars are very good today 8. Hypertension and on metoprolol 9. We will get social worker involved since wife mentioned that the patient will go out to rehab initially. 10. Gastric ulcer seen on endoscopy today.  Start Pepcid  Code Status:     Code Status Orders  (From admission, onward)         Start     Ordered   07/23/18 0123  Full code  Continuous     07/23/18 0122        Code Status History    Date Active Date Inactive Code Status Order ID Comments User Context   04/05/2016 0304 04/05/2016 1851 Full Code [id number]  Harvie Bridge, DO Inpatient   10/19/2015 0103 10/20/2015 1102 Full Code [id number]  Harrie Foreman, MD Inpatient   08/21/2015 1912 08/23/2015 1657 Full Code [id number]  Gladstone Lighter, MD Inpatient   02/19/2015 1627 02/20/2015 2054 Full Code [id number]  Henreitta Leber, MD Inpatient     Family Communication: Sister at the bedside.  Wife on the phone. Disposition Plan: Wife states that he will not be coming home initially and he will go out to rehab.  Consultants:  Gastroenterology  Antibiotics:  Unasyn  Time spent: 26 minutes  [place]

## 2018-08-28 NOTE — Progress Notes (Signed)
PT Cancellation Note  Patient Details Name: MARCO RAPER MRN: [id number] DOB: [birth year]   Cancelled Treatment:    Reason Eval/Treat Not Completed: Patient at procedure or test/unavailable, will attempt to see pt at a future date/time as medically appropriate.     Linus Salmons PT, DPT 08/28/18, 2:59 PM

## 2018-08-29 ENCOUNTER — Inpatient Hospital Stay: Payer: PPO

## 2018-08-29 LAB — GLUCOSE, CAPILLARY
GLUCOSE-CAPILLARY: 165 mg/dL — AB (ref 70–99)
GLUCOSE-CAPILLARY: 101 mg/dL — AB (ref 70–99)
Glucose-Capillary: 112 mg/dL — ABNORMAL HIGH (ref 70–99)
Glucose-Capillary: 114 mg/dL — ABNORMAL HIGH (ref 70–99)
Glucose-Capillary: 124 mg/dL — ABNORMAL HIGH (ref 70–99)
Glucose-Capillary: 138 mg/dL — ABNORMAL HIGH (ref 70–99)

## 2018-08-29 LAB — COMPREHENSIVE METABOLIC PANEL
ALBUMIN: 2.6 g/dL — AB (ref 3.5–5.0)
ALT: 134 U/L — ABNORMAL HIGH (ref 0–44)
AST: 23 U/L (ref 15–41)
Alkaline Phosphatase: 148 U/L — ABNORMAL HIGH (ref 38–126)
Anion gap: 7 (ref 5–15)
BUN: 8 mg/dL (ref 8–23)
CHLORIDE: 106 mmol/L (ref 98–111)
CO2: 24 mmol/L (ref 22–32)
Calcium: 7.6 mg/dL — ABNORMAL LOW (ref 8.9–10.3)
Creatinine, Ser: 0.39 mg/dL — ABNORMAL LOW (ref 0.61–1.24)
GFR calc Af Amer: >60 mL/min (ref >60)
GFR calc non Af Amer: >60 mL/min (ref >60)
Glucose, Bld: 141 mg/dL — ABNORMAL HIGH (ref 70–99)
POTASSIUM: 3 mmol/L — AB (ref 3.5–5.1)
Sodium: 137 mmol/L (ref 135–145)
Total Bilirubin: 0.7 mg/dL (ref 0.3–1.2)
Total Protein: 5.5 g/dL — ABNORMAL LOW (ref 6.5–8.1)

## 2018-08-29 LAB — RPR: RPR Ser Ql: NONREACTIVE

## 2018-08-29 LAB — AMMONIA: Ammonia: 24 umol/L (ref 9–35)

## 2018-08-29 LAB — HEMOGLOBIN: Hemoglobin: 10.8 g/dL — ABNORMAL LOW (ref 13.0–17.0)

## 2018-08-29 MED ORDER — POTASSIUM CHLORIDE 20 MEQ PO PACK
40.0000 meq | PACK | Freq: Once | ORAL | Status: AC
  Administered 2018-08-29: 40 meq
  Filled 2018-08-29: qty 2

## 2018-08-29 MED ORDER — GLUCERNA 1.5 CAL PO LIQD
1000.0000 mL | ORAL | Status: DC
  Administered 2018-08-29: 1000 mL

## 2018-08-29 MED ORDER — GUAIFENESIN-DM 100-10 MG/5ML PO SYRP
5.0000 mL | ORAL_SOLUTION | ORAL | Status: DC | PRN
  Administered 2018-08-29 – 2018-09-03 (×2): 5 mL
  Filled 2018-08-29 (×2): qty 5

## 2018-08-29 MED ORDER — ENOXAPARIN SODIUM 40 MG/0.4ML ~~LOC~~ SOLN
40.0000 mg | SUBCUTANEOUS | Status: DC
  Administered 2018-08-29 – 2018-09-03 (×6): 40 mg via SUBCUTANEOUS
  Filled 2018-08-29 (×6): qty 0.4

## 2018-08-29 NOTE — Progress Notes (Signed)
Physical Therapy Treatment Patient Details Name: Maurice Patterson MRN: [id number] DOB: August 27, 1955 Today's Date: 08/29/2018    History of Present Illness 63 y/o male here with pneumonia and respiratory distress has intubated (on and off) and ultimately extubated 12/23.      PT Comments    Pt was able to show meaningful participation with PT session today.  He was able to keep eyes open much of the session and did show inconsistent ability to do some active movement with LEs (less in L) but did have enough awareness to show occasional effort that appeared voluntary.  Pt is very week, very limited and ultimately remains unsafe to attempt mobility tasks but clearly improved mental status from previous week+ of working with PT.  Pt also able to show some AAROM purposeful movement with UEs however again remains very weak.   Follow Up Recommendations  SNF     Equipment Recommendations       Recommendations for Other Services       Precautions / Restrictions Precautions Precautions: Fall Restrictions Weight Bearing Restrictions: No    Mobility  Bed Mobility               General bed mobility comments: still not appropriate/able to tolerate sitting/mobility  Transfers                    Ambulation/Gait                 Stairs             Wheelchair Mobility    Modified Rankin (Stroke Patients Only)       Balance                                            Cognition Arousal/Alertness: Lethargic Behavior During Therapy: Flat affect(pt did show infrequent changes in facial features) Overall Cognitive Status: Impaired/Different from baseline Area of Impairment: Orientation;Attention;Following commands;Safety/judgement;Awareness                               General Comments: Pt more awake today than previous few PT sessions, however still very limited with AMS/cognition/etc      Exercises General Exercises -  Lower Extremity Ankle Circles/Pumps: PROM;15 reps;Both(occasional flickers of movement on L, no motion on L) Short Arc Quad: PROM;AAROM;15 reps;Both Heel Slides: PROM;15 reps;Both(Pt able to do a few reps of AAROM (R>L) v. inconsistent) Hip ABduction/ADduction: PROM;15 reps;Both Other Exercises Other Exercises: P/AAROM b/l UE exercises including shld elevation, elbow flx/ext (chronic L ROM limitations) and grip/squeeze.  10-15 reps each again with inconsistent AROM effort    General Comments        Pertinent Vitals/Pain Pain Assessment: (did not indicated a lot of pain, some "guarding" with ROM)    Home Living                      Prior Function            PT Goals (current goals can now be found in the care plan section) Progress towards PT goals: Progressing toward goals    Frequency    Min 2X/week      PT Plan Current plan remains appropriate    Co-evaluation  AM-PAC PT "6 Clicks" Mobility   Outcome Measure  Help needed turning from your back to your side while in a flat bed without using bedrails?: Total Help needed moving from lying on your back to sitting on the side of a flat bed without using bedrails?: Total Help needed moving to and from a bed to a chair (including a wheelchair)?: Total Help needed standing up from a chair using your arms (e.g., wheelchair or bedside chair)?: Total Help needed to walk in hospital room?: Total Help needed climbing 3-5 steps with a railing? : Total 6 Click Score: 6    End of Session   Activity Tolerance: Patient limited by lethargy Patient left: with bed alarm set;with call bell/phone within reach;with nursing/sitter in room Nurse Communication: Mobility status PT Visit Diagnosis: Other abnormalities of gait and mobility (R26.89);Muscle weakness (generalized) (M62.81)     Time: 8295-6213 PT Time Calculation (min) (ACUTE ONLY): 28 min  Charges:  $Therapeutic Exercise: 23-37 mins                      Kreg Shropshire, DPT 08/29/2018, 5:31 PM

## 2018-08-29 NOTE — Progress Notes (Signed)
Patient alert and able to follow simple commands. On RA, VSS and in no distress. PEG tube remains patent with no complications throughout the shift. Plan is to transfer patient out of ICU; wife is aware and requests to be notified of transfer.

## 2018-08-29 NOTE — Progress Notes (Signed)
Patient ID: Maurice Patterson, male   DOB: 10/22/1955, 63 y.o.   MRN: [id number]  [facility] PROGRESS NOTE  Maurice Patterson SVX:[id number] DOB: 23-Aug-1956 DOA: 07/22/2018 PCP: [name], MD  HPI/Subjective: Patient seen early afternoon.  He opened his eyes and tried to answer few questions.  He was moving arms on his own.  Objective: Vitals:   08/29/18 1322 08/29/18 1400  BP:  (!) 156/87  Pulse:  83  Resp:    Temp:    SpO2: 96%     Filed Weights   08/27/18 0436 08/28/18 1114 08/29/18 0403  Weight: 60.9 kg 60.9 kg 60.4 kg    ROS: Review of Systems  Unable to perform ROS: Acuity of condition  Respiratory: Negative for shortness of breath.   Gastrointestinal: Negative for abdominal pain.   Exam: Physical Exam  HENT:  Nose: No mucosal edema.  Mouth/Throat: No oropharyngeal exudate.  Dry mouth  Eyes: Pupils are equal, round, and reactive to light. Conjunctivae and lids are normal.  Neck: No thyromegaly present.  Cardiovascular: S1 normal and S2 normal.  Murmur heard. Respiratory: He has decreased breath sounds in the right lower field and the left lower field. He has no wheezes. He has no rhonchi. He has no rales.  GI: Soft. Bowel sounds are normal. There is no abdominal tenderness.  Musculoskeletal:     Right ankle: He exhibits no swelling.     Left ankle: He exhibits no swelling.  Neurological: He is alert.  Skin: Skin is warm. No rash noted.  Psychiatric:  Alert.  Answers a few questions but not others.      Data Reviewed: Basic Metabolic Panel: Recent Labs  Lab 08/25/18 0032 08/25/18 0404 08/26/18 0551 08/27/18 0352 08/29/18 0631  NA 139 141 140 139 137  K 4.5 4.3 3.4* 3.1* 3.0*  CL 103 105 108 108 106  CO2 28 26 26 26 24   GLUCOSE 123* 142* 144* 130* 141*  BUN 17 18 14 12 8   CREATININE 0.70 0.86 0.68 0.41* 0.39*  CALCIUM 8.8* 8.6* 8.0* 7.7* 7.6*  MG  --   --  2.0  --   --   PHOS  --   --  2.6  --   --    Liver Function Tests: Recent  Labs  Lab 08/24/18 0311 08/25/18 0404 08/26/18 0551 08/27/18 0352 08/29/18 0631  AST 380* 103* 43* 52* 23  ALT 1,121* 683* 383* 251* 134*  ALKPHOS 239* 236* 182* 146* 148*  BILITOT 0.6 0.7 0.7 0.3 0.7  PROT 6.0* 6.5 6.2* 5.5* 5.5*  ALBUMIN 3.1* 3.2* 3.0* 2.7* 2.6*    Recent Labs  Lab 08/23/18 1327 08/29/18 0631  AMMONIA 34 24   CBC: Recent Labs  Lab 08/24/18 0311 08/25/18 0032 08/26/18 0551 08/27/18 0352 08/29/18 0631  WBC 6.5 11.0* 9.9 6.1  --   NEUTROABS  --   --  7.5  --   --   HGB 13.1 14.1 12.2* 10.5* 10.8*  HCT 41.6 43.4 37.8* 33.1*  --   MCV 94.5 93.5 93.8 96.2  --   PLT 189 196 123* 105*  --    BNP (last 3 results) Recent Labs    07/22/18 2239 08/03/18 1120  BNP 40.0 95.0    CBG: Recent Labs  Lab 08/29/18 0003 08/29/18 0402 08/29/18 0737 08/29/18 1143 08/29/18 1602  GLUCAP 124* 114* 138* 112* 165*    Recent Results (from the past 240 hour(s))  CULTURE, BLOOD (ROUTINE X 2) w Reflex  to ID Panel     Status: None (Preliminary result)   Collection Time: 08/25/18 12:32 AM  Result Value Ref Range Status   Specimen Description BLOOD RIGHT FATTY CASTS  Final   Special Requests   Final    BOTTLES DRAWN AEROBIC AND ANAEROBIC Blood Culture adequate volume   Culture   Final    NO GROWTH 4 DAYS Performed at [facility], [address], [place], [place] [address]    Report Status PENDING  Incomplete  CULTURE, BLOOD (ROUTINE X 2) w Reflex to ID Panel     Status: None (Preliminary result)   Collection Time: 08/25/18 12:32 AM  Result Value Ref Range Status   Specimen Description BLOOD RIGHT HAND  Final   Special Requests   Final    BOTTLES DRAWN AEROBIC AND ANAEROBIC Blood Culture adequate volume   Culture   Final    NO GROWTH 4 DAYS Performed at [facility], [address], [place], [place] [address]    Report Status PENDING  Incomplete  Culture, respiratory     Status: None   Collection Time: 08/25/18  6:13 AM  Result Value  Ref Range Status   Specimen Description   Final    TRACHEAL ASPIRATE Performed at [facility], [address] [place], [place] [address]    Special Requests   Final    NONE Performed at [facility], [address] [place], [place] [address]    Gram Stain   Final    MODERATE WBC PRESENT, PREDOMINANTLY PMN RARE SQUAMOUS EPITHELIAL CELLS PRESENT FEW GRAM POSITIVE COCCI RARE YEAST    Culture   Final    MODERATE Consistent with normal respiratory flora. Performed at [facility], [place] [address], [place], [place] [address]    Report Status 08/27/2018 FINAL  Final      Scheduled Meds: . aspirin  81 mg Per Tube Daily  . budesonide (PULMICORT) nebulizer solution  0.25 mg Nebulization BID  . chlorhexidine  15 mL Mouth/Throat BID  . donepezil  10 mg Per Tube BID  . enoxaparin (LOVENOX) injection  40 mg Subcutaneous Q24H  . free water  180 mL Per Tube Q4H  . insulin aspart  0-15 Units Subcutaneous Q4H  . ipratropium-albuterol  3 mL Nebulization TID  . memantine  10 mg Per Tube BID  . metoprolol tartrate  25 mg Per Tube BID  . polyethylene glycol  17 g Per Tube Daily  . senna-docusate  1 tablet Per Tube Daily   Continuous Infusions: . sodium chloride 0  (08/27/18 1447)  . ampicillin-sulbactam (UNASYN) IV Stopped (08/29/18 0829)  . chlorproMAZINE (THORAZINE) IV    . famotidine (PEPCID) IV Stopped (08/29/18 1057)  . feeding supplement (GLUCERNA 1.5 CAL) 1,000 mL (08/29/18 1336)    Assessment/Plan:  1. Sepsis with aspiration pneumonia.  On Unasyn with stop date. 2. Acute metabolic encephalopathy.  Patient received medications with endoscopy yesterday and may take a day or 2 to clear.  Family wants to limit medications as much as possible.  MRI of the brain negative for acute process.  EEG generalized slowing.  LP negative.  3. Elevated liver function test.  AST and ALT very elevated on presentation and now almost in the normal range on the ALT.  AST  normalized. 4. Dysphasia with worsening encephalopathy.  PEG tube placed yesterday.  Swallow evaluation potentially tomorrow if more alert. 5. Hypokalemia.  Replace today. 6. Hyponatremia has improved.  7. Type 2  diabetes.  Start back on Lantus insulin low-dose.  Since now on tube feeds. 8. Hypertension and on metoprolol 9. Gastric ulcer seen on endoscopy yesterday.  Start Pepcid 10. Spoke with wife on the phone and she spoke with Eddie Dibbles the head nurse in the ICU about transferring out of the ICU last night and again this morning.  Code Status:     Code Status Orders  (From admission, onward)         Start     Ordered   07/23/18 0123  Full code  Continuous     07/23/18 0122        Code Status History    Date Active Date Inactive Code Status Order ID Comments User Context   04/05/2016 0304 04/05/2016 1851 Full Code [id number]  Harvie Bridge, DO Inpatient   10/19/2015 0103 10/20/2015 1102 Full Code [id number]  Harrie Foreman, MD Inpatient   08/21/2015 1912 08/23/2015 1657 Full Code [id number]  Gladstone Lighter, MD Inpatient   02/19/2015 1627 02/20/2015 2054 Full Code [id number]  Henreitta Leber, MD Inpatient     Family Communication: Spoke with wife on the phone Disposition Plan: Potentially out to rehab on Thursday or Friday if mental status improves  Consultants:  Gastroenterology  Antibiotics:  Unasyn  Time spent: 25 minutes  [place]

## 2018-08-30 ENCOUNTER — Inpatient Hospital Stay: Payer: PPO

## 2018-08-30 DIAGNOSIS — R633 Feeding difficulties: Secondary | ICD-10-CM

## 2018-08-30 LAB — BLOOD GAS, ARTERIAL
ACID-BASE EXCESS: 4.2 mmol/L — AB (ref 0.0–2.0)
Bicarbonate: 27.3 mmol/L (ref 20.0–28.0)
FIO2: 0.28
O2 Saturation: 93.7 %
Patient temperature: 37
pCO2 arterial: 35 mmHg (ref 32.0–48.0)
pH, Arterial: 7.5 — ABNORMAL HIGH (ref 7.350–7.450)
pO2, Arterial: 63 mmHg — ABNORMAL LOW (ref 83.0–108.0)

## 2018-08-30 LAB — CBC
HCT: 37.9 % — ABNORMAL LOW (ref 39.0–52.0)
Hemoglobin: 12.4 g/dL — ABNORMAL LOW (ref 13.0–17.0)
MCH: 30.2 pg (ref 26.0–34.0)
MCHC: 32.7 g/dL (ref 30.0–36.0)
MCV: 92.2 fL (ref 80.0–100.0)
PLATELETS: 153 10*3/uL (ref 150–400)
RBC: 4.11 MIL/uL — ABNORMAL LOW (ref 4.22–5.81)
RDW: 16.2 % — ABNORMAL HIGH (ref 11.5–15.5)
WBC: 8.5 10*3/uL (ref 4.0–10.5)
nRBC: 0 % (ref 0.0–0.2)

## 2018-08-30 LAB — GLUCOSE, CAPILLARY
GLUCOSE-CAPILLARY: 99 mg/dL (ref 70–99)
Glucose-Capillary: 155 mg/dL — ABNORMAL HIGH (ref 70–99)
Glucose-Capillary: 146 mg/dL — ABNORMAL HIGH (ref 70–99)
Glucose-Capillary: 110 mg/dL — ABNORMAL HIGH (ref 70–99)
Glucose-Capillary: 150 mg/dL — ABNORMAL HIGH (ref 70–99)
Glucose-Capillary: 102 mg/dL — ABNORMAL HIGH (ref 70–99)

## 2018-08-30 LAB — CULTURE, BLOOD (ROUTINE X 2)
Culture: NO GROWTH
Culture: NO GROWTH
Special Requests: ADEQUATE
Special Requests: ADEQUATE

## 2018-08-30 MED ORDER — PANTOPRAZOLE SODIUM 40 MG PO PACK
40.0000 mg | PACK | Freq: Every day | ORAL | Status: DC
  Administered 2018-08-30 – 2018-09-08 (×9): 40 mg
  Filled 2018-08-30 (×10): qty 20

## 2018-08-30 MED ORDER — IPRATROPIUM-ALBUTEROL 0.5-2.5 (3) MG/3ML IN SOLN
3.0000 mL | Freq: Once | RESPIRATORY_TRACT | Status: AC
  Administered 2018-08-30: 3 mL via RESPIRATORY_TRACT
  Filled 2018-08-30: qty 3

## 2018-08-30 MED ORDER — METOCLOPRAMIDE HCL 5 MG PO TABS
5.0000 mg | ORAL_TABLET | Freq: Two times a day (BID) | ORAL | Status: DC
  Administered 2018-08-30 – 2018-09-05 (×13): 5 mg via ORAL
  Filled 2018-08-30 (×13): qty 1

## 2018-08-30 MED ORDER — GLUCERNA 1.5 CAL PO LIQD
1000.0000 mL | ORAL | Status: DC
  Administered 2018-08-30: 1000 mL

## 2018-08-30 MED ORDER — AMOXICILLIN-POT CLAVULANATE 400-57 MG/5ML PO SUSR
875.0000 mg | Freq: Two times a day (BID) | ORAL | Status: DC
  Administered 2018-08-30 (×2): 875 mg
  Filled 2018-08-30 (×3): qty 10.9

## 2018-08-30 MED ORDER — CHOLECALCIFEROL 10 MCG/ML (400 UNIT/ML) PO LIQD
1000.0000 [IU] | Freq: Every day | ORAL | Status: DC
  Administered 2018-08-30 – 2018-09-08 (×9): 1000 [IU]
  Filled 2018-08-30 (×11): qty 2.5

## 2018-08-30 MED ORDER — LABETALOL HCL 5 MG/ML IV SOLN
10.0000 mg | INTRAVENOUS | Status: DC | PRN
  Administered 2018-08-30: 10 mg via INTRAVENOUS
  Filled 2018-08-30: qty 4

## 2018-08-30 MED ORDER — CLOPIDOGREL BISULFATE 75 MG PO TABS
75.0000 mg | ORAL_TABLET | Freq: Every day | ORAL | Status: DC
  Administered 2018-08-30 – 2018-09-08 (×8): 75 mg
  Filled 2018-08-30 (×9): qty 1

## 2018-08-30 NOTE — Progress Notes (Signed)
RN and NT attempted to get pt up to the chair per family request. Pt was not able to bear any weight or help. RN and NT were able to sit pt in the side of the bed for a few minutes before family requested to lay pt back in the bed. RN educated family about exercises that pt can do while he is in the bed. Will continue to assess and monitor pt.

## 2018-08-30 NOTE — Care Management Important Message (Signed)
Copy of signed Medicare IM left with patient in room. 

## 2018-08-30 NOTE — Progress Notes (Addendum)
Speech Therapy note: reviewed chart notes; consulted NSG re: pt's status today. Per NSG, pt continues to remain drowsy/sleepy often, and in addition, pt may be having trouble tolerating the TFs recently initiated post PEG placement. Pt would not be recommended to resume Pleasure po's (bites and sips of his previous oral diet, Puree w/ thin liquids) if having any difficulty tolerating TFs, or if lethargic/sleepy as this could increase risk for aspiration to occur.  Discussed w/ NSG that when this current situation is resolved (pt's primary route of nutrition/hydration for his medical needs) then ST services would f/u w/ education on Pleasure po's; toleration of recommended po's. Recommend frequent oral care for hygiene and stimulation of swallowing. NSG agreed.    Orinda Kenner, [place], CCC-SLP

## 2018-08-30 NOTE — Progress Notes (Signed)
Patient ID: Maurice Patterson, male   DOB: 03/21/1956, 63 y.o.   MRN: [id number]  [facility] PROGRESS NOTE  Maurice Patterson [id number]:[id number] DOB: 07-27-1956 DOA: 07/22/2018 PCP: [name], MD  HPI/Subjective:  Patient laying in bed.  Sister at bedside. Has some trouble breathing and coughing overnight and tube feeds stopped.  Repeat chest x-ray looks clear.  Afebrile.  Objective: Vitals:   08/30/18 0853 08/30/18 1139  BP: 94/82 (!) 142/80  Pulse: 83 83  Resp:    Temp:  97.9 F (36.6 C)  SpO2:  93%    Filed Weights   08/28/18 1114 08/29/18 0403 08/30/18 0500  Weight: 60.9 kg 60.4 kg 66 kg    ROS: Review of Systems  Unable to perform ROS: Acuity of condition  Respiratory: Negative for shortness of breath.   Gastrointestinal: Negative for abdominal pain.   Exam: Physical Exam  HENT:  Nose: No mucosal edema.  Mouth/Throat: No oropharyngeal exudate.  Dry mouth  Eyes: Pupils are equal, round, and reactive to light. Conjunctivae and lids are normal.  Neck: No thyromegaly present.  Cardiovascular: S1 normal and S2 normal.  Murmur heard. Respiratory: He has decreased breath sounds in the right lower field and the left lower field. He has no wheezes. He has no rhonchi. He has no rales.  GI: Soft. Bowel sounds are normal. There is no abdominal tenderness.  Musculoskeletal:     Right ankle: He exhibits no swelling.     Left ankle: He exhibits no swelling.  Neurological: He is alert.  Skin: Skin is warm. No rash noted.  Psychiatric:  Non verbal      Data Reviewed: Basic Metabolic Panel: Recent Labs  Lab 08/25/18 0032 08/25/18 0404 08/26/18 0551 08/27/18 0352 08/29/18 0631  NA 139 141 140 139 137  K 4.5 4.3 3.4* 3.1* 3.0*  CL 103 105 108 108 106  CO2 28 26 26 26 24   GLUCOSE 123* 142* 144* 130* 141*  BUN 17 18 14 12 8   CREATININE 0.70 0.86 0.68 0.41* 0.39*  CALCIUM 8.8* 8.6* 8.0* 7.7* 7.6*  MG  --   --  2.0  --   --   PHOS  --   --  2.6  --   --     Liver Function Tests: Recent Labs  Lab 08/24/18 0311 08/25/18 0404 08/26/18 0551 08/27/18 0352 08/29/18 0631  AST 380* 103* 43* 52* 23  ALT 1,121* 683* 383* 251* 134*  ALKPHOS 239* 236* 182* 146* 148*  BILITOT 0.6 0.7 0.7 0.3 0.7  PROT 6.0* 6.5 6.2* 5.5* 5.5*  ALBUMIN 3.1* 3.2* 3.0* 2.7* 2.6*    Recent Labs  Lab 08/23/18 1327 08/29/18 0631  AMMONIA 34 24   CBC: Recent Labs  Lab 08/24/18 0311 08/25/18 0032 08/26/18 0551 08/27/18 0352 08/29/18 0631 08/30/18 0203  WBC 6.5 11.0* 9.9 6.1  --  8.5  NEUTROABS  --   --  7.5  --   --   --   HGB 13.1 14.1 12.2* 10.5* 10.8* 12.4*  HCT 41.6 43.4 37.8* 33.1*  --  37.9*  MCV 94.5 93.5 93.8 96.2  --  92.2  PLT 189 196 123* 105*  --  153   BNP (last 3 results) Recent Labs    07/22/18 2239 08/03/18 1120  BNP 40.0 95.0    CBG: Recent Labs  Lab 08/29/18 2140 08/30/18 0100 08/30/18 0534 08/30/18 0731 08/30/18 1140  GLUCAP 101* 155* 146* 110* 99    Recent Results (from the  past 240 hour(s))  CULTURE, BLOOD (ROUTINE X 2) w Reflex to ID Panel     Status: None   Collection Time: 08/25/18 12:32 AM  Result Value Ref Range Status   Specimen Description BLOOD RIGHT FATTY CASTS  Final   Special Requests   Final    BOTTLES DRAWN AEROBIC AND ANAEROBIC Blood Culture adequate volume   Culture   Final    NO GROWTH 5 DAYS Performed at [facility], [address] [place], [place] [address]    Report Status 08/30/2018 FINAL  Final  CULTURE, BLOOD (ROUTINE X 2) w Reflex to ID Panel     Status: None   Collection Time: 08/25/18 12:32 AM  Result Value Ref Range Status   Specimen Description BLOOD RIGHT HAND  Final   Special Requests   Final    BOTTLES DRAWN AEROBIC AND ANAEROBIC Blood Culture adequate volume   Culture   Final    NO GROWTH 5 DAYS Performed at [facility], [address], [place], [place] [address]    Report Status 08/30/2018 FINAL  Final  Culture, respiratory     Status: None    Collection Time: 08/25/18  6:13 AM  Result Value Ref Range Status   Specimen Description   Final    TRACHEAL ASPIRATE Performed at [facility], [address] [place], [place] [address]    Special Requests   Final    NONE Performed at [facility], [address] [place], [place] [address]    Gram Stain   Final    MODERATE WBC PRESENT, PREDOMINANTLY PMN RARE SQUAMOUS EPITHELIAL CELLS PRESENT FEW GRAM POSITIVE COCCI RARE YEAST    Culture   Final    MODERATE Consistent with normal respiratory flora. Performed at [facility], [place] [address] [place], [place] [address]    Report Status 08/27/2018 FINAL  Final      Scheduled Meds: . amoxicillin-clavulanate  875 mg Per Tube Q12H  . aspirin  81 mg Per Tube Daily  . budesonide (PULMICORT) nebulizer solution  0.25 mg Nebulization BID  . chlorhexidine  15 mL Mouth/Throat BID  . cholecalciferol  1,000 Units Per Tube Daily  . clopidogrel  75 mg Per Tube Daily  . donepezil  10 mg Per Tube BID  . enoxaparin (LOVENOX) injection  40 mg Subcutaneous Q24H  . free water  180 mL Per Tube Q4H  . insulin aspart  0-15 Units Subcutaneous Q4H  . ipratropium-albuterol  3 mL Nebulization TID  . memantine  10 mg Per Tube BID  . metoprolol tartrate  25 mg Per Tube BID  . pantoprazole sodium  40 mg Per Tube Daily  . polyethylene glycol  17 g Per Tube Daily  . senna-docusate  1 tablet Per Tube Daily   Continuous Infusions: . sodium chloride 0  (08/27/18 1447)  . chlorproMAZINE (THORAZINE) IV    . famotidine (PEPCID) IV 20 mg (08/30/18 0856)  . feeding supplement (GLUCERNA 1.5 CAL) Stopped (08/30/18 0215)    Assessment/Plan:  1. Sepsis with aspiration pneumonia.  On Unasyn.  Will change to Augmentin through tube 2. Acute metabolic encephalopathy.  Family wants to limit medications as much as possible.  MRI of the brain negative for acute process.  EEG generalized slowing.  LP negative.  No improvement 3. Elevated  liver function test.  AST and ALT very elevated on presentation and now almost in the normal range on the ALT.  AST normalized. 4. Dysphasia with  worsening encephalopathy.  PEG tube placed 08/28/2018.  Swallow evaluation when more awake. Restart  Tube feeds at slower rate. Discussed with Dietician 5. Hypokalemia.  Replaced  6. Hyponatremia has improved.  7. Type 2 diabetes.   Lantus insulin low-dose.  Since now on tube feeds. 8. Hypertension - on metoprolol 9. Gastric ulcer seen on endoscopy .  Started Pepcid 10. Discussed with wife over phone.  Code Status:     Code Status Orders  (From admission, onward)         Start     Ordered   07/23/18 0123  Full code  Continuous     07/23/18 0122        Code Status History    Date Active Date Inactive Code Status Order ID Comments User Context   04/05/2016 0304 04/05/2016 1851 Full Code [id number]  Harvie Bridge, DO Inpatient   10/19/2015 0103 10/20/2015 1102 Full Code [id number]  Harrie Foreman, MD Inpatient   08/21/2015 1912 08/23/2015 1657 Full Code [id number]  Gladstone Lighter, MD Inpatient   02/19/2015 1627 02/20/2015 2054 Full Code [id number]  Henreitta Leber, MD Inpatient     Family Communication: Spoke with wife on the phone Disposition Plan: Potentially out to rehab in 1-2 if mental status improves  Consultants:  Gastroenterology  Antibiotics:  Unasyn  Time spent: 25 minutes  [facility]

## 2018-08-30 NOTE — Progress Notes (Addendum)
Nutrition Follow-up  DOCUMENTATION CODES:   Not applicable  INTERVENTION:   Resume Glucerna 1.5 Cal at goal rate of 55 mL/hr (1320 mL goal daily volume) per tube. Recommend initiate at 53m/hr and increase by 159mhr q8 hours until goal rate is reached.   Regimen provides 1980 kcal, 109 grams of protein, 1003 mL H2O daily.  Provide free water flush of 180 ml via gravity q4hrs for a total of 2083 mL H2O daily.  Goal TF regimen meets 100% RDIs for vitamins/minerals.  NUTRITION DIAGNOSIS:   Inadequate oral intake related to (acute metabolic encephalopathy) as evidenced by meal completion < 25%.  Ongoing - addressing with TF regimen.  GOAL:   Patient will meet greater than or equal to 90% of their needs  Met with TF regimen.  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  Pt s/p PEG placement 1/6- gastric ulcer noted during EGD  Pt restarted on tube feeds yesterday around noon. Pt tolerated tube feeds well. Pt was noted to have coughing episode with diaphoresis around 2am; tube feeds stopped at that time. Chest radiograph from today unchanged from previous. Pt is afebrile. Will plan to restart tube feeds today at a slower rate and gradually increase back to goal. Will also add reglan to stimulate GI motility. Pt with multiple weight fluctuations since admit. Pt at low refeed risk at this point but will check Mg and P tomorrow morning as these have npt been rechecked since 1/4. Continue free water flushes; recommend administer by gravity.   If patient unable to tolerate continuous gastric tube feeds; pt may require G-J tube placement via GI service.   Medications reviewed and include:  augmentin, aspirin, insulin, protonix, miralax, senokot, cholecalciferol, plavix, lovenox, pepcid  Labs reviewed: K 3.0(L)- 1/7 P 2.6 wnl, Mg 2.0 wnl cbgs- 155, 146, 110, 99 x 24hrs  Diet Order:   Diet Order            Diet NPO time specified  Diet effective now             EDUCATION NEEDS:   No education needs have been identified at this time  Skin:  Skin Assessment: Skin Integrity Issues:(DTI to sacrum; ecchymosis to bilateral arms)  Last BM:  1/7  Height:   Ht Readings from Last 1 Encounters:  08/28/18 '5\' 6"'$  (1.676 m)   Weight:   Wt Readings from Last 1 Encounters:  08/30/18 66 kg   Ideal Body Weight:  70 kg  BMI:  Body mass index is 23.48 kg/m.  Estimated Nutritional Needs:   Kcal:  1845-2150 (MSJ x 1.2-1.4)  Protein:  85-105 grams (1.2-1.5 grams/kg)  Fluid:  >1.9L/day   CaKoleen DistanceS, RD, LDN Pager #- [phone]#- [phone] Hours Pager: [phone]

## 2018-08-30 NOTE — Progress Notes (Signed)
Brief GI note  Post PEG placement.  Patient underwent PEG placement on 08/28/2018 No acute events since the procedure.  Patient started tube feeds yesterday, has been tolerating well.  He is receiving bowel regimen as well. The PEG site appeared healthy, nonpurulent and no bleeding, the external bumper was at 2 cm marking abutting the skin.  I loosened the bumper to 3 cm marking.  There was circumferential motion of the PEG tube  Continue routine PEG care Free water flushes to prevent clogging of the PEG tube Advance tube feeds as tolerated  GI will sign off, please call us back with questions  Cephas Darby, MD [place]  [place], [place] [address]  Main: [phone]  Fax: [phone] Pager: [phone]

## 2018-08-30 NOTE — Progress Notes (Signed)
Unable to perform CPT due to call to ED

## 2018-08-30 NOTE — Progress Notes (Signed)
Patient diaphoretic, BP 180/88, respirations 28 and constantly coughing. MD notified and he ordered stat ABGs, CBC, oxygen therapy, tube feedings to be stopped and prn Labetalol IV. Patient on 2L oxygen,  tube feeding stopped, Labetalol 10mg  has been administered and RT at bedside taking ABGs. Will reassess vitals and continue to monitor patient.

## 2018-08-31 LAB — BASIC METABOLIC PANEL
Anion gap: 8 (ref 5–15)
BUN: 9 mg/dL (ref 8–23)
CO2: 25 mmol/L (ref 22–32)
Calcium: 8.2 mg/dL — ABNORMAL LOW (ref 8.9–10.3)
Chloride: 102 mmol/L (ref 98–111)
Creatinine, Ser: 0.53 mg/dL — ABNORMAL LOW (ref 0.61–1.24)
Glucose, Bld: 133 mg/dL — ABNORMAL HIGH (ref 70–99)
Potassium: 3.5 mmol/L (ref 3.5–5.1)
Sodium: 135 mmol/L (ref 135–145)

## 2018-08-31 LAB — GLUCOSE, CAPILLARY
Glucose-Capillary: 141 mg/dL — ABNORMAL HIGH (ref 70–99)
Glucose-Capillary: 140 mg/dL — ABNORMAL HIGH (ref 70–99)
Glucose-Capillary: 156 mg/dL — ABNORMAL HIGH (ref 70–99)
Glucose-Capillary: 165 mg/dL — ABNORMAL HIGH (ref 70–99)
Glucose-Capillary: 65 mg/dL — ABNORMAL LOW (ref 70–99)
Glucose-Capillary: 146 mg/dL — ABNORMAL HIGH (ref 70–99)
Glucose-Capillary: 225 mg/dL — ABNORMAL HIGH (ref 70–99)

## 2018-08-31 LAB — PHOSPHORUS: Phosphorus: 2.5 mg/dL (ref 2.5–4.6)

## 2018-08-31 LAB — MAGNESIUM: Magnesium: 1.8 mg/dL (ref 1.7–2.4)

## 2018-08-31 MED ORDER — AMOXICILLIN-POT CLAVULANATE 875-125 MG PO TABS
1.0000 | ORAL_TABLET | Freq: Two times a day (BID) | ORAL | Status: AC
  Administered 2018-08-31 (×2): 1 via ORAL
  Filled 2018-08-31 (×2): qty 1

## 2018-08-31 MED ORDER — DEXTROSE 50 % IV SOLN
25.0000 mL | Freq: Once | INTRAVENOUS | Status: AC
  Administered 2018-08-31: 25 mL via INTRAVENOUS
  Filled 2018-08-31: qty 50

## 2018-08-31 MED ORDER — GLUCERNA 1.5 CAL PO LIQD
1000.0000 mL | ORAL | Status: DC
  Administered 2018-08-31: 1000 mL

## 2018-08-31 MED ORDER — POTASSIUM CHLORIDE 20 MEQ PO PACK
20.0000 meq | PACK | Freq: Two times a day (BID) | ORAL | Status: DC
  Administered 2018-09-01 – 2018-09-08 (×14): 20 meq
  Filled 2018-08-31 (×14): qty 1

## 2018-08-31 MED ORDER — FAMOTIDINE 20 MG PO TABS
20.0000 mg | ORAL_TABLET | Freq: Two times a day (BID) | ORAL | Status: DC
  Administered 2018-08-31 – 2018-09-08 (×15): 20 mg via ORAL
  Filled 2018-08-31 (×15): qty 1

## 2018-08-31 MED ORDER — MAGNESIUM SULFATE 2 GM/50ML IV SOLN
2.0000 g | Freq: Once | INTRAVENOUS | Status: AC
  Administered 2018-08-31: 2 g via INTRAVENOUS
  Filled 2018-08-31: qty 50

## 2018-08-31 NOTE — Progress Notes (Signed)
Hypoglycemic Event  CBG: 65  Treatment: D50 25 mL (12.5 gm)  Symptoms: Sweaty  Follow-up CBG: Time:1220 CBG Result:165  Possible Reasons for Event: Inadequate meal intake     Maurice Patterson

## 2018-08-31 NOTE — Progress Notes (Signed)
Patient ID: NASIRE REALI, male   DOB: 04-Jul-1956, 63 y.o.   MRN: [id number]  [facility] PROGRESS NOTE  MADS BORGMEYER SPQ:[id number] DOB: 10-07-1955 DOA: 07/22/2018 PCP: [name], MD  HPI/Subjective: Patient lying in bed.  Unable to answer some questions.  Not following commands.  He is moving his arms on his own.  Objective: Vitals:   08/31/18 0931 08/31/18 1209  BP: 127/75 (!) 155/70  Pulse: (!) 101 79  Resp:  16  Temp:  98.4 F (36.9 C)  SpO2:  97%    Filed Weights   08/28/18 1114 08/29/18 0403 08/30/18 0500  Weight: 60.9 kg 60.4 kg 66 kg    ROS: Review of Systems  Unable to perform ROS: Acuity of condition   Exam: Physical Exam  Constitutional: He appears lethargic.  HENT:  Nose: No mucosal edema.  Mouth/Throat: No oropharyngeal exudate.  Dry mouth  Eyes: Pupils are equal, round, and reactive to light. Conjunctivae and lids are normal.  Neck: No thyromegaly present.  Cardiovascular: S1 normal and S2 normal.  Murmur heard. Respiratory: He has decreased breath sounds in the right lower field and the left lower field. He has no wheezes. He has rhonchi in the right lower field and the left lower field. He has no rales.  GI: Soft. Bowel sounds are normal. There is no abdominal tenderness.  Musculoskeletal:     Right ankle: He exhibits no swelling.     Left ankle: He exhibits no swelling.  Neurological: He appears lethargic.  Arm stiff to movement.  Skin: Skin is warm. No rash noted.  Psychiatric:  Lethargic      Data Reviewed: Basic Metabolic Panel: Recent Labs  Lab 08/25/18 0404 08/26/18 0551 08/27/18 0352 08/29/18 0631 08/31/18 0403  NA 141 140 139 137 135  K 4.3 3.4* 3.1* 3.0* 3.5  CL 105 108 108 106 102  CO2 26 26 26 24 25   GLUCOSE 142* 144* 130* 141* 133*  BUN 18 14 12 8 9   CREATININE 0.86 0.68 0.41* 0.39* 0.53*  CALCIUM 8.6* 8.0* 7.7* 7.6* 8.2*  MG  --  2.0  --   --  1.8  PHOS  --  2.6  --   --  2.5   Liver Function  Tests: Recent Labs  Lab 08/25/18 0404 08/26/18 0551 08/27/18 0352 08/29/18 0631  AST 103* 43* 52* 23  ALT 683* 383* 251* 134*  ALKPHOS 236* 182* 146* 148*  BILITOT 0.7 0.7 0.3 0.7  PROT 6.5 6.2* 5.5* 5.5*  ALBUMIN 3.2* 3.0* 2.7* 2.6*    Recent Labs  Lab 08/29/18 0631  AMMONIA 24   CBC: Recent Labs  Lab 08/25/18 0032 08/26/18 0551 08/27/18 0352 08/29/18 0631 08/30/18 0203  WBC 11.0* 9.9 6.1  --  8.5  NEUTROABS  --  7.5  --   --   --   HGB 14.1 12.2* 10.5* 10.8* 12.4*  HCT 43.4 37.8* 33.1*  --  37.9*  MCV 93.5 93.8 96.2  --  92.2  PLT 196 123* 105*  --  153   BNP (last 3 results) Recent Labs    07/22/18 2239 08/03/18 1120  BNP 40.0 95.0    CBG: Recent Labs  Lab 08/31/18 0445 08/31/18 0747 08/31/18 1155 08/31/18 1220 08/31/18 1514  GLUCAP 140* 156* 65* 165* 146*    Recent Results (from the past 240 hour(s))  CULTURE, BLOOD (ROUTINE X 2) w Reflex to ID Panel     Status: None   Collection Time: 08/25/18  12:32 AM  Result Value Ref Range Status   Specimen Description BLOOD RIGHT FATTY CASTS  Final   Special Requests   Final    BOTTLES DRAWN AEROBIC AND ANAEROBIC Blood Culture adequate volume   Culture   Final    NO GROWTH 5 DAYS Performed at [facility], [address] [place], [place] [address]    Report Status 08/30/2018 FINAL  Final  CULTURE, BLOOD (ROUTINE X 2) w Reflex to ID Panel     Status: None   Collection Time: 08/25/18 12:32 AM  Result Value Ref Range Status   Specimen Description BLOOD RIGHT HAND  Final   Special Requests   Final    BOTTLES DRAWN AEROBIC AND ANAEROBIC Blood Culture adequate volume   Culture   Final    NO GROWTH 5 DAYS Performed at [facility], [address], [place], [place] [address]    Report Status 08/30/2018 FINAL  Final  Culture, respiratory     Status: None   Collection Time: 08/25/18  6:13 AM  Result Value Ref Range Status   Specimen Description   Final    TRACHEAL ASPIRATE Performed  at [facility], [address] [place], [place] [address]    Special Requests   Final    NONE Performed at [facility], [address] [place], [place] [address]    Gram Stain   Final    MODERATE WBC PRESENT, PREDOMINANTLY PMN RARE SQUAMOUS EPITHELIAL CELLS PRESENT FEW GRAM POSITIVE COCCI RARE YEAST    Culture   Final    MODERATE Consistent with normal respiratory flora. Performed at [facility], [place] [address], [place],  [address]    Report Status 08/27/2018 FINAL  Final      Scheduled Meds: . amoxicillin-clavulanate  1 tablet Oral Q12H  . aspirin  81 mg Per Tube Daily  . budesonide (PULMICORT) nebulizer solution  0.25 mg Nebulization BID  . chlorhexidine  15 mL Mouth/Throat BID  . cholecalciferol  1,000 Units Per Tube Daily  . clopidogrel  75 mg Per Tube Daily  . donepezil  10 mg Per Tube BID  . enoxaparin (LOVENOX) injection  40 mg Subcutaneous Q24H  . free water  180 mL Per Tube Q4H  . insulin aspart  0-15 Units Subcutaneous Q4H  . ipratropium-albuterol  3 mL Nebulization TID  . memantine  10 mg Per Tube BID  . metoCLOPramide  5 mg Oral BID  . metoprolol tartrate  25 mg Per Tube BID  . pantoprazole sodium  40 mg Per Tube Daily  . polyethylene glycol  17 g Per Tube Daily  . [START ON 09/01/2018] potassium chloride  20 mEq Per Tube BID  . senna-docusate  1 tablet Per Tube Daily   Continuous Infusions: . sodium chloride 250 mL (08/30/18 2252)  . famotidine (PEPCID) IV Stopped (08/31/18 1003)  . feeding supplement (GLUCERNA 1.5 CAL) 1,000 mL (08/31/18 1217)  . magnesium sulfate 1 - 4 g bolus IVPB 2 g (08/31/18 1523)    Assessment/Plan:  1. Sepsis with aspiration pneumonia.  Last day of Augmentin.  With rhonchi in the lungs continue nebulizer treatments.  Still high risk for aspiration. 2. Acute metabolic encephalopathy.  Patient still altered.  As per family takes a long time to get rid of sedative medications.  Family wants to  limit medications as much as possible.  MRI of the brain negative for acute process.  EEG generalized slowing.  LP negative.  3.  Elevated liver function test.  AST and ALT very elevated on presentation and now almost in the normal range on the ALT.  AST normalized. 4. Dysphasia with worsening encephalopathy.  PEG tube placed.  So far not tolerating the rate that he needs.  Reglan started by Dr. Darvin Neighbours yesterday. 5. Hypokalemia.  Replace via tube. 6. Hyponatremia has improved.  7. Type 2 diabetes.  Sliding scale only 8. Hypertension and on metoprolol 9. Gastric ulcer seen on endoscopy.  On Pepcid  Code Status:     Code Status Orders  (From admission, onward)         Start     Ordered   07/23/18 0123  Full code  Continuous     07/23/18 0122        Code Status History    Date Active Date Inactive Code Status Order ID Comments User Context   04/05/2016 0304 04/05/2016 1851 Full Code [id number]  Harvie Bridge, DO Inpatient   10/19/2015 0103 10/20/2015 1102 Full Code [id number]  Harrie Foreman, MD Inpatient   08/21/2015 1912 08/23/2015 1657 Full Code [id number]  Gladstone Lighter, MD Inpatient   02/19/2015 1627 02/20/2015 2054 Full Code [id number]  Henreitta Leber, MD Inpatient     Family Communication: Spoke with wife on the phone Disposition Plan: At this point I do not have a good plan.  Not tolerating tube feeds yet.  Still with altered mental status.  Consultants:  Gastroenterology  Antibiotics:  Last day of Augmentin today  Time spent: 26 minutes  [place]

## 2018-08-31 NOTE — Clinical Social Work Note (Signed)
Patient not yet ready for discharge. [facility] has been updated. Patient has tele sitter at request of patient's wife. Due to this being a family request and patient having no safety concerns regarding attempting to get out of bed or pulling at lines, patient will not need to have tele sitter discontinued prior to discharge. Shela Leff MSW,LCSW [phone]

## 2018-08-31 NOTE — Progress Notes (Signed)
Physical Therapy Treatment Patient Details Name: Maurice Patterson MRN: [id number] DOB: 1955/12/14 Today's Date: 08/31/2018    History of Present Illness 63 y/o male here with pneumonia and respiratory distress has been intubated (on and off) and ultimately extubated 12/23.  Pt with altered mental status/encephalopathy limiting interaction, now with PEG tube.    PT Comments    Pt continues to be very lethargic, minimally interactive and struggles with performing any tasks.  Not truly appropriate to be sitting at this time, though apparently nursing briefly  assisted him to EOB (max assist +2) with no real assist from pt.   He seemed less interactive or able to try purposeful exercises today than 2 days ago (last time this PT saw him) but is is still better cognitively than the previous few weeks as he was able to keep eyes open some of the time and did appear to show very inconsistent and highly limited AROM with some exercises that appeared to be more than reflexive.  He did show some reaction (discomfort) to PROM calf stretches bilaterally, PT able to get ankles to neutral with some effort and multiple reps.  Follow Up Recommendations  SNF     Equipment Recommendations  None recommended by PT    Recommendations for Other Services       Precautions / Restrictions Precautions Precautions: Fall Restrictions Weight Bearing Restrictions: No    Mobility  Bed Mobility               General bed mobility comments: Still too weak and with limited mental acuity to sit.  Apparently nursing sat pt at EOB yesterday (per family request) and needed total assist and did not tolerate it long at all.    Transfers                    Ambulation/Gait                 Stairs             Wheelchair Mobility    Modified Rankin (Stroke Patients Only)       Balance                                            Cognition Arousal/Alertness:  Lethargic Behavior During Therapy: Flat affect                                   General Comments: Pt again more awake than the previous few weeks, but still no verbalization or purposeful interaction      Exercises General Exercises - Lower Extremity Ankle Circles/Pumps: PROM;15 reps;Both(calf stretches with calcaneal lock b/l, no AROM with ankle) Short Arc Quad: PROM;AAROM;15 reps;Both Heel Slides: PROM;15 reps;Both(again very inconsistent and limited "AROM" with cuing) Hip ABduction/ADduction: PROM;15 reps;Both(some hip ADd R>L intermittently with reps)    General Comments        Pertinent Vitals/Pain Pain Assessment: (unable to rate, shows some discomfort with calf stretches)    Home Living                      Prior Function            PT Goals (current goals can now be found in the care plan section) Progress towards PT goals:  Not progressing toward goals - comment(pt with some occasional AROM that seems more than reflexive)    Frequency    Min 2X/week      PT Plan Current plan remains appropriate    Co-evaluation              AM-PAC PT "6 Clicks" Mobility   Outcome Measure  Help needed turning from your back to your side while in a flat bed without using bedrails?: Total Help needed moving from lying on your back to sitting on the side of a flat bed without using bedrails?: Total Help needed moving to and from a bed to a chair (including a wheelchair)?: Total Help needed standing up from a chair using your arms (e.g., wheelchair or bedside chair)?: Total Help needed to walk in hospital room?: Total Help needed climbing 3-5 steps with a railing? : Total 6 Click Score: 6    End of Session   Activity Tolerance: Patient limited by lethargy Patient left: with bed alarm set;with call bell/phone within reach;with nursing/sitter in room Nurse Communication: Mobility status PT Visit Diagnosis: Other abnormalities of gait and  mobility (R26.89);Muscle weakness (generalized) (M62.81)     Time: 4210-3128 PT Time Calculation (min) (ACUTE ONLY): 26 min  Charges:  $Therapeutic Exercise: 23-37 mins                     Kreg Shropshire, DPT 08/31/2018, 12:02 PM

## 2018-09-01 LAB — BASIC METABOLIC PANEL
Anion gap: 7 (ref 5–15)
BUN: 10 mg/dL (ref 8–23)
CO2: 27 mmol/L (ref 22–32)
Calcium: 8 mg/dL — ABNORMAL LOW (ref 8.9–10.3)
Chloride: 102 mmol/L (ref 98–111)
Creatinine, Ser: 0.48 mg/dL — ABNORMAL LOW (ref 0.61–1.24)
GFR calc Af Amer: >60 mL/min (ref >60)
GFR calc non Af Amer: >60 mL/min (ref >60)
GLUCOSE: 166 mg/dL — AB (ref 70–99)
Potassium: 3.7 mmol/L (ref 3.5–5.1)
Sodium: 136 mmol/L (ref 135–145)

## 2018-09-01 LAB — CBC
HCT: 36.3 % — ABNORMAL LOW (ref 39.0–52.0)
HEMOGLOBIN: 12 g/dL — AB (ref 13.0–17.0)
MCH: 30.1 pg (ref 26.0–34.0)
MCHC: 33.1 g/dL (ref 30.0–36.0)
MCV: 91 fL (ref 80.0–100.0)
Platelets: 181 10*3/uL (ref 150–400)
RBC: 3.99 MIL/uL — ABNORMAL LOW (ref 4.22–5.81)
RDW: 16.4 % — ABNORMAL HIGH (ref 11.5–15.5)
WBC: 5.7 10*3/uL (ref 4.0–10.5)
nRBC: 0 % (ref 0.0–0.2)

## 2018-09-01 LAB — GLUCOSE, CAPILLARY
GLUCOSE-CAPILLARY: 144 mg/dL — AB (ref 70–99)
GLUCOSE-CAPILLARY: 168 mg/dL — AB (ref 70–99)
Glucose-Capillary: 153 mg/dL — ABNORMAL HIGH (ref 70–99)
Glucose-Capillary: 150 mg/dL — ABNORMAL HIGH (ref 70–99)
Glucose-Capillary: 181 mg/dL — ABNORMAL HIGH (ref 70–99)
Glucose-Capillary: 163 mg/dL — ABNORMAL HIGH (ref 70–99)

## 2018-09-01 MED ORDER — GLUCERNA 1.5 CAL PO LIQD
1000.0000 mL | ORAL | Status: DC
  Administered 2018-09-01 – 2018-09-08 (×8): 1000 mL

## 2018-09-01 NOTE — Progress Notes (Signed)
Physical Therapy Treatment Patient Details Name: Maurice Patterson MRN: [id number] DOB: 1956/05/15 Today's Date: 09/01/2018    History of Present Illness 63 y/o male here with pneumonia and respiratory distress has been intubated (on and off) and ultimately extubated 12/23.  Pt with altered mental status/encephalopathy limiting interaction, now with PEG tube.    PT Comments    Pt continues to be lethargic and highly limited, however he was clearly more alert and more able to follow PT cuing/instructions this session than any previous effort with PT.  He was able to do multiple repeated AROM SLRs, showed some voluntary effort with hip Ab/Ad, heel slides, leg extensions, etc. He is still very inconsistent and needs excessive cuing but again did considerably more actively than his typical.  Pt responsive to calf stretches (appearing to have discomfort, but no explicit pain).  Pt was also awake enough to attempt sitting at EOB (max assist t/o the effort) and showed some effort with trying to use UEs to hold rail/PT's hand.  PT blocking feet to keep from slipping and providing heavy assist to keep him from falling back.  Pt making slow gains, but shows best effort yet, though remains very limited.   Follow Up Recommendations  SNF     Equipment Recommendations  None recommended by PT    Recommendations for Other Services       Precautions / Restrictions Precautions Precautions: Fall Restrictions Weight Bearing Restrictions: No    Mobility  Bed Mobility Overal bed mobility: Needs Assistance Bed Mobility: Supine to Sit;Sit to Supine     Supine to sit: Max assist Sit to supine: Total assist   General bed mobility comments: Pt needed total assist to get to sitting EOB, but did show some effort with using hands to assist staying upright (still needed max assist to maintain sitting).  ~5 minutes of EOB sitting with heavy cuing and effort - showed awareness of what he was doing and again  attempted to help stay upright.  Pt with some fatigue but ultimately seemed to tolerate sitting relatively well  Transfers                    Ambulation/Gait                 Stairs             Wheelchair Mobility    Modified Rankin (Stroke Patients Only)       Balance Overall balance assessment: Needs assistance Sitting-balance support: Single extremity supported;Bilateral upper extremity supported;Feet supported Sitting balance-Leahy Scale: Zero Sitting balance - Comments: Pt needed continuous max assist to maintain sitting, but did show attempts to assist                                    Cognition Arousal/Alertness: Lethargic Behavior During Therapy: Flat affect(more 'awake' when awake, fell asleep ~10X t/o session) Overall Cognitive Status: Impaired/Different from baseline Area of Impairment: Orientation;Attention;Following commands;Safety/judgement;Awareness                               General Comments: Pt attempted to interact/speak with PT (with great difficulty, incoherent/off topic) but was able to open eyes more consistently (though still fell asleep multiple times t/o the session)      Exercises General Exercises - Lower Extremity Ankle Circles/Pumps: PROM;15 reps;Both(passive calf stretches) Heel Slides:  AAROM;15 reps;PROM;AROM(Pt able to do a few AROM sets, but needed heavy cuing) Hip ABduction/ADduction: AAROM;PROM;15 reps(Pt showed inconsistent, but at times clearly AAROM, R>L) Straight Leg Raises: AROM;15 reps;Both(Pt able to stay awake, focused enough to do repeated reps)    General Comments        Pertinent Vitals/Pain Pain Assessment: (unable to report, discomfort with with calf stretches) Faces Pain Scale: Hurts little more    Home Living                      Prior Function            PT Goals (current goals can now be found in the care plan section) Progress towards PT goals:  Progressing toward goals    Frequency    Min 2X/week      PT Plan Current plan remains appropriate    Co-evaluation              AM-PAC PT "6 Clicks" Mobility   Outcome Measure  Help needed turning from your back to your side while in a flat bed without using bedrails?: Total Help needed moving from lying on your back to sitting on the side of a flat bed without using bedrails?: Total Help needed moving to and from a bed to a chair (including a wheelchair)?: Total Help needed standing up from a chair using your arms (e.g., wheelchair or bedside chair)?: Total Help needed to walk in hospital room?: Total Help needed climbing 3-5 steps with a railing? : Total 6 Click Score: 6    End of Session   Activity Tolerance: Patient limited by fatigue;Patient limited by lethargy Patient left: with bed alarm set;with call bell/phone within reach;with nursing/sitter in room Nurse Communication: Mobility status PT Visit Diagnosis: Other abnormalities of gait and mobility (R26.89);Muscle weakness (generalized) (M62.81)     Time: 9833-8250 PT Time Calculation (min) (ACUTE ONLY): 28 min  Charges:  $Therapeutic Exercise: 8-22 mins $Therapeutic Activity: 8-22 mins                     Kreg Shropshire, DPT 09/01/2018, 5:22 PM

## 2018-09-01 NOTE — Plan of Care (Signed)
Pt. Not taking in foods orally, continues to be lethargic

## 2018-09-01 NOTE — Progress Notes (Signed)
Patient ID: Maurice Patterson, male   DOB: 03/15/1956, 63 y.o.   MRN: [id number]  [facility] PROGRESS NOTE  Maurice Patterson PFX:[id number] DOB: 1955/10/17 DOA: 07/22/2018 PCP: [name], MD  HPI/Subjective: I was unable to stimulate him much to wake him up.  Patient snoring pretty loudly when I was in there.  Wife stated that they were able to speak with him last night.  They are hopeful that over the weekend they will stimulate him morning to be more like himself.  Objective: Vitals:   09/01/18 1018 09/01/18 1148  BP: 124/79 (!) 155/74  Pulse: 88 75  Resp:  18  Temp:  98 F (36.7 C)  SpO2:  98%    Filed Weights   08/28/18 1114 08/29/18 0403 08/30/18 0500  Weight: 60.9 kg 60.4 kg 66 kg    ROS: Review of Systems  Unable to perform ROS: Acuity of condition   Exam: Physical Exam  Constitutional: He appears lethargic.  HENT:  Nose: No mucosal edema.  Mouth/Throat: No oropharyngeal exudate.  Dry mouth  Eyes: Pupils are equal, round, and reactive to light. Conjunctivae and lids are normal.  Neck: No thyromegaly present.  Cardiovascular: S1 normal and S2 normal.  Murmur heard. Respiratory: He has decreased breath sounds in the right lower field and the left lower field. He has no wheezes. He has rhonchi in the right lower field and the left lower field. He has no rales.  GI: Soft. Bowel sounds are normal. There is no abdominal tenderness.  Musculoskeletal:     Right ankle: He exhibits no swelling.     Left ankle: He exhibits no swelling.  Neurological: He appears lethargic.  Arm stiff to movement.  Skin: Skin is warm. No rash noted.  Psychiatric:  Lethargic      Data Reviewed: Basic Metabolic Panel: Recent Labs  Lab 08/26/18 0551 08/27/18 0352 08/29/18 0631 08/31/18 0403 09/01/18 0537  NA 140 139 137 135 136  K 3.4* 3.1* 3.0* 3.5 3.7  CL 108 108 106 102 102  CO2 26 26 24 25 27   GLUCOSE 144* 130* 141* 133* 166*  BUN 14 12 8 9 10   CREATININE  0.68 0.41* 0.39* 0.53* 0.48*  CALCIUM 8.0* 7.7* 7.6* 8.2* 8.0*  MG 2.0  --   --  1.8  --   PHOS 2.6  --   --  2.5  --    Liver Function Tests: Recent Labs  Lab 08/26/18 0551 08/27/18 0352 08/29/18 0631  AST 43* 52* 23  ALT 383* 251* 134*  ALKPHOS 182* 146* 148*  BILITOT 0.7 0.3 0.7  PROT 6.2* 5.5* 5.5*  ALBUMIN 3.0* 2.7* 2.6*    Recent Labs  Lab 08/29/18 0631  AMMONIA 24   CBC: Recent Labs  Lab 08/26/18 0551 08/27/18 0352 08/29/18 0631 08/30/18 0203 09/01/18 0537  WBC 9.9 6.1  --  8.5 5.7  NEUTROABS 7.5  --   --   --   --   HGB 12.2* 10.5* 10.8* 12.4* 12.0*  HCT 37.8* 33.1*  --  37.9* 36.3*  MCV 93.8 96.2  --  92.2 91.0  PLT 123* 105*  --  153 181   BNP (last 3 results) Recent Labs    07/22/18 2239 08/03/18 1120  BNP 40.0 95.0    CBG: Recent Labs  Lab 08/31/18 2140 09/01/18 0117 09/01/18 0447 09/01/18 0756 09/01/18 1143  GLUCAP 225* 144* 153* 150* 181*    Recent Results (from the past 240 hour(s))  CULTURE,  BLOOD (ROUTINE X 2) w Reflex to ID Panel     Status: None   Collection Time: 08/25/18 12:32 AM  Result Value Ref Range Status   Specimen Description BLOOD RIGHT FATTY CASTS  Final   Special Requests   Final    BOTTLES DRAWN AEROBIC AND ANAEROBIC Blood Culture adequate volume   Culture   Final    NO GROWTH 5 DAYS Performed at [facility], [address] [place], [place] [address]    Report Status 08/30/2018 FINAL  Final  CULTURE, BLOOD (ROUTINE X 2) w Reflex to ID Panel     Status: None   Collection Time: 08/25/18 12:32 AM  Result Value Ref Range Status   Specimen Description BLOOD RIGHT HAND  Final   Special Requests   Final    BOTTLES DRAWN AEROBIC AND ANAEROBIC Blood Culture adequate volume   Culture   Final    NO GROWTH 5 DAYS Performed at [facility], [address] [place], [place] [address]    Report Status 08/30/2018 FINAL  Final  Culture, respiratory     Status: None   Collection Time: 08/25/18  6:13  AM  Result Value Ref Range Status   Specimen Description   Final    TRACHEAL ASPIRATE Performed at [facility], [address] [place], [place] [address]    Special Requests   Final    NONE Performed at [facility], [address] [place], [place] [address]    Gram Stain   Final    MODERATE WBC PRESENT, PREDOMINANTLY PMN RARE SQUAMOUS EPITHELIAL CELLS PRESENT FEW GRAM POSITIVE COCCI RARE YEAST    Culture   Final    MODERATE Consistent with normal respiratory flora. Performed at [facility], [place] [address], [place], [place] [address]    Report Status 08/27/2018 FINAL  Final      Scheduled Meds: . aspirin  81 mg Per Tube Daily  . budesonide (PULMICORT) nebulizer solution  0.25 mg Nebulization BID  . chlorhexidine  15 mL Mouth/Throat BID  . cholecalciferol  1,000 Units Per Tube Daily  . clopidogrel  75 mg Per Tube Daily  . donepezil  10 mg Per Tube BID  . enoxaparin (LOVENOX) injection  40 mg Subcutaneous Q24H  . famotidine  20 mg Oral BID  . free water  180 mL Per Tube Q4H  . insulin aspart  0-15 Units Subcutaneous Q4H  . ipratropium-albuterol  3 mL Nebulization TID  . memantine  10 mg Per Tube BID  . metoCLOPramide  5 mg Oral BID  . metoprolol tartrate  25 mg Per Tube BID  . pantoprazole sodium  40 mg Per Tube Daily  . polyethylene glycol  17 g Per Tube Daily  . potassium chloride  20 mEq Per Tube BID  . senna-docusate  1 tablet Per Tube Daily   Continuous Infusions: . sodium chloride 250 mL (08/30/18 2252)  . feeding supplement (GLUCERNA 1.5 CAL) 1,000 mL (09/01/18 1149)    Assessment/Plan:  1. Acute metabolic encephalopathy.  Wife states that they will stimulate him over the weekend and he is better with familiar faces.  Hopefully he will get back to his baseline mental status when his wife is at the bedside.  Patient still altered.  As per family takes a long time to get rid of sedative medications.  Family wants to limit medications  as much as possible.  MRI of the brain negative for acute process.  EEG generalized  slowing.  LP negative.  2. Clinical sepsis with aspiration pneumonia.  Patient finished course of antibiotics again.  Still high risk for aspiration with altered mental status. 3. Elevated liver function test.  AST and ALT very elevated on presentation and now almost in the normal range on the ALT.  AST normalized. 4. Dysphasia with worsening encephalopathy.  PEG tube placed.  When I saw him today he was up to 55 mL/h of tube feeding which is up to goal. 5. Hypokalemia.  Replaced yesterday 6. Hyponatremia has improved.  7. Type 2 diabetes.  Sliding scale only 8. Hypertension and on metoprolol 9. Gastric ulcer seen on endoscopy.  On Pepcid  Code Status:     Code Status Orders  (From admission, onward)         Start     Ordered   07/23/18 0123  Full code  Continuous     07/23/18 0122        Code Status History    Date Active Date Inactive Code Status Order ID Comments User Context   04/05/2016 0304 04/05/2016 1851 Full Code [id number]  Harvie Bridge, DO Inpatient   10/19/2015 0103 10/20/2015 1102 Full Code [id number]  Harrie Foreman, MD Inpatient   08/21/2015 1912 08/23/2015 1657 Full Code [id number]  Gladstone Lighter, MD Inpatient   02/19/2015 1627 02/20/2015 2054 Full Code [id number]  Henreitta Leber, MD Inpatient     Family Communication: Spoke with wife on the phone Disposition Plan: Hopefully family can stimulate over the weekend to get him back to his normal mental status.  He does better with familiar faces.  Patient will eventually go out to rehab.  Consultants:  Gastroenterology  Time spent: 25 minutes  [place]

## 2018-09-01 NOTE — Care Management Important Message (Signed)
Copy of signed Medicare IM left with patient in room. 

## 2018-09-02 LAB — GLUCOSE, CAPILLARY
GLUCOSE-CAPILLARY: 168 mg/dL — AB (ref 70–99)
Glucose-Capillary: 218 mg/dL — ABNORMAL HIGH (ref 70–99)
Glucose-Capillary: 198 mg/dL — ABNORMAL HIGH (ref 70–99)
Glucose-Capillary: 161 mg/dL — ABNORMAL HIGH (ref 70–99)
Glucose-Capillary: 156 mg/dL — ABNORMAL HIGH (ref 70–99)
Glucose-Capillary: 201 mg/dL — ABNORMAL HIGH (ref 70–99)
Glucose-Capillary: 221 mg/dL — ABNORMAL HIGH (ref 70–99)

## 2018-09-02 LAB — BASIC METABOLIC PANEL
Anion gap: 8 (ref 5–15)
BUN: 13 mg/dL (ref 8–23)
CHLORIDE: 105 mmol/L (ref 98–111)
CO2: 25 mmol/L (ref 22–32)
Calcium: 8.9 mg/dL (ref 8.9–10.3)
Creatinine, Ser: 0.58 mg/dL — ABNORMAL LOW (ref 0.61–1.24)
GFR calc non Af Amer: >60 mL/min (ref >60)
Glucose, Bld: 169 mg/dL — ABNORMAL HIGH (ref 70–99)
Potassium: 4.2 mmol/L (ref 3.5–5.1)
SODIUM: 138 mmol/L (ref 135–145)

## 2018-09-02 LAB — CBC
HCT: 42.6 % (ref 39.0–52.0)
Hemoglobin: 13.6 g/dL (ref 13.0–17.0)
MCH: 29.4 pg (ref 26.0–34.0)
MCHC: 31.9 g/dL (ref 30.0–36.0)
MCV: 92.2 fL (ref 80.0–100.0)
Platelets: 254 10*3/uL (ref 150–400)
RBC: 4.62 MIL/uL (ref 4.22–5.81)
RDW: 16.5 % — ABNORMAL HIGH (ref 11.5–15.5)
WBC: 6.7 10*3/uL (ref 4.0–10.5)
nRBC: 0 % (ref 0.0–0.2)

## 2018-09-02 NOTE — Progress Notes (Signed)
Patient ID: Maurice Patterson, male   DOB: July 09, 1956, 63 y.o.   MRN: [id number]  [facility] PROGRESS NOTE  Maurice Patterson:[id number] DOB: 26-Jun-1956 DOA: 07/22/2018 PCP: [name], MD  HPI/Subjective:  Drowsy but opens eyes and tries to answer questions.  Confused.  N.p.o.  Receiving tube feeds.  Afebrile.  Objective: Vitals:   09/02/18 0346 09/02/18 1140  BP: 131/70 125/79  Pulse: 75 75  Resp: 20 16  Temp: (!) 97.4 F (36.3 C) 97.7 F (36.5 C)  SpO2: 100% 98%    Filed Weights   08/29/18 0403 08/30/18 0500 09/01/18 1553  Weight: 60.4 kg 66 kg 61.9 kg    ROS: Review of Systems  Unable to perform ROS: Acuity of condition   Exam: Physical Exam  Constitutional: He appears lethargic.  HENT:  Nose: No mucosal edema.  Mouth/Throat: No oropharyngeal exudate.  Dry mouth  Eyes: Pupils are equal, round, and reactive to light. Conjunctivae and lids are normal.  Neck: No thyromegaly present.  Cardiovascular: S1 normal and S2 normal.  Murmur heard. Respiratory: He has decreased breath sounds in the right lower field and the left lower field. He has no wheezes. He has rhonchi in the right lower field and the left lower field. He has no rales.  GI: Soft. Bowel sounds are normal. There is no abdominal tenderness.  Musculoskeletal:     Right ankle: He exhibits no swelling.     Left ankle: He exhibits no swelling.  Neurological: He appears lethargic.  Arm stiff to movement.  Skin: Skin is warm. No rash noted.  Psychiatric:  Lethargic      Data Reviewed: Basic Metabolic Panel: Recent Labs  Lab 08/27/18 0352 08/29/18 0631 08/31/18 0403 09/01/18 0537  NA 139 137 135 136  K 3.1* 3.0* 3.5 3.7  CL 108 106 102 102  CO2 26 24 25 27   GLUCOSE 130* 141* 133* 166*  BUN 12 8 9 10   CREATININE 0.41* 0.39* 0.53* 0.48*  CALCIUM 7.7* 7.6* 8.2* 8.0*  MG  --   --  1.8  --   PHOS  --   --  2.5  --    Liver Function Tests: Recent Labs  Lab 08/27/18 0352  08/29/18 0631  AST 52* 23  ALT 251* 134*  ALKPHOS 146* 148*  BILITOT 0.3 0.7  PROT 5.5* 5.5*  ALBUMIN 2.7* 2.6*    Recent Labs  Lab 08/29/18 0631  AMMONIA 24   CBC: Recent Labs  Lab 08/27/18 0352 08/29/18 0631 08/30/18 0203 09/01/18 0537  WBC 6.1  --  8.5 5.7  HGB 10.5* 10.8* 12.4* 12.0*  HCT 33.1*  --  37.9* 36.3*  MCV 96.2  --  92.2 91.0  PLT 105*  --  153 181   BNP (last 3 results) Recent Labs    07/22/18 2239 08/03/18 1120  BNP 40.0 95.0    CBG: Recent Labs  Lab 09/01/18 2019 09/02/18 0130 09/02/18 0507 09/02/18 0751 09/02/18 1136  GLUCAP 168* 218* 198* 161* 156*    Recent Results (from the past 240 hour(s))  CULTURE, BLOOD (ROUTINE X 2) w Reflex to ID Panel     Status: None   Collection Time: 08/25/18 12:32 AM  Result Value Ref Range Status   Specimen Description BLOOD RIGHT FATTY CASTS  Final   Special Requests   Final    BOTTLES DRAWN AEROBIC AND ANAEROBIC Blood Culture adequate volume   Culture   Final    NO GROWTH 5 DAYS Performed  at [facility], [address] [place], [place] [address]    Report Status 08/30/2018 FINAL  Final  CULTURE, BLOOD (ROUTINE X 2) w Reflex to ID Panel     Status: None   Collection Time: 08/25/18 12:32 AM  Result Value Ref Range Status   Specimen Description BLOOD RIGHT HAND  Final   Special Requests   Final    BOTTLES DRAWN AEROBIC AND ANAEROBIC Blood Culture adequate volume   Culture   Final    NO GROWTH 5 DAYS Performed at [facility], [address] [place], [place] [address]    Report Status 08/30/2018 FINAL  Final  Culture, respiratory     Status: None   Collection Time: 08/25/18  6:13 AM  Result Value Ref Range Status   Specimen Description   Final    TRACHEAL ASPIRATE Performed at [facility], [address] [place], [place] [address]    Special Requests   Final    NONE Performed at [facility], [address] [place], [place] [address]    Gram  Stain   Final    MODERATE WBC PRESENT, PREDOMINANTLY PMN RARE SQUAMOUS EPITHELIAL CELLS PRESENT FEW GRAM POSITIVE COCCI RARE YEAST    Culture   Final    MODERATE Consistent with normal respiratory flora. Performed at [facility], [place] [address] [place],  [address]    Report Status 08/27/2018 FINAL  Final      Scheduled Meds: . aspirin  81 mg Per Tube Daily  . budesonide (PULMICORT) nebulizer solution  0.25 mg Nebulization BID  . chlorhexidine  15 mL Mouth/Throat BID  . cholecalciferol  1,000 Units Per Tube Daily  . clopidogrel  75 mg Per Tube Daily  . donepezil  10 mg Per Tube BID  . enoxaparin (LOVENOX) injection  40 mg Subcutaneous Q24H  . famotidine  20 mg Oral BID  . free water  180 mL Per Tube Q4H  . insulin aspart  0-15 Units Subcutaneous Q4H  . ipratropium-albuterol  3 mL Nebulization TID  . memantine  10 mg Per Tube BID  . metoCLOPramide  5 mg Oral BID  . metoprolol tartrate  25 mg Per Tube BID  . pantoprazole sodium  40 mg Per Tube Daily  . polyethylene glycol  17 g Per Tube Daily  . potassium chloride  20 mEq Per Tube BID  . senna-docusate  1 tablet Per Tube Daily   Continuous Infusions: . sodium chloride 250 mL (08/30/18 2252)  . feeding supplement (GLUCERNA 1.5 CAL) 55 mL/hr at 09/02/18 1100    Assessment/Plan:  1. Acute metabolic encephalopathy. MRI of the brain negative for acute process.  EEG generalized slowing.  LP negative.  Has diagnosis of dementia with intermittent sundowning and delirium at home.  Presently significant inpatient delirium.  No sedative medications on board. 2. Clinical sepsis with aspiration pneumonia.  Patient finished course of antibiotics again.  Still high risk for aspiration with altered mental status. 3. Elevated liver function test.  AST and ALT very elevated on presentation and now almost in the normal range on the ALT.  AST normalized. 4. Dysphasia with worsening encephalopathy.  PEG tube placed.  Tube feedings  at goal 5. Hypokalemia.  Replaced yesterday 6. Hyponatremia has improved.  7. Type 2 diabetes.  Sliding scale only 8. Hypertension and on metoprolol 9. Gastric ulcer seen on endoscopy.  On Pepcid  Likely discharge to skilled nursing facility on Monday once more awake  Code Status:     Code Status Orders  (From admission, onward)         Start     Ordered   07/23/18 0123  Full code  Continuous     07/23/18 0122        Code Status History    Date Active Date Inactive Code Status Order ID Comments User Context   04/05/2016 0304 04/05/2016 1851 Full Code [id number]  Harvie Bridge, DO Inpatient   10/19/2015 0103 10/20/2015 1102 Full Code [id number]  Harrie Foreman, MD Inpatient   08/21/2015 1912 08/23/2015 1657 Full Code [id number]  Gladstone Lighter, MD Inpatient   02/19/2015 1627 02/20/2015 2054 Full Code [id number]  Henreitta Leber, MD Inpatient      Consultants:  Gastroenterology  Time spent: 30 minutes  [facility]

## 2018-09-02 NOTE — Progress Notes (Signed)
MD was notified pt. Being diaphoretic episode x 2. CBG was 168. VS stable. New Order received. Pt mental status same as this am.

## 2018-09-02 NOTE — Progress Notes (Signed)
RN called to pause tube feeding for CPT. Feedings paused for duration of treatment. Will resume feedings post treatment. Head of bed 39 degrees. Patient alert and responsive, no sputum production as this time.

## 2018-09-03 LAB — GLUCOSE, CAPILLARY
Glucose-Capillary: 134 mg/dL — ABNORMAL HIGH (ref 70–99)
Glucose-Capillary: 178 mg/dL — ABNORMAL HIGH (ref 70–99)
Glucose-Capillary: 191 mg/dL — ABNORMAL HIGH (ref 70–99)
Glucose-Capillary: 195 mg/dL — ABNORMAL HIGH (ref 70–99)
Glucose-Capillary: 199 mg/dL — ABNORMAL HIGH (ref 70–99)
Glucose-Capillary: 224 mg/dL — ABNORMAL HIGH (ref 70–99)

## 2018-09-03 NOTE — Progress Notes (Signed)
Patient ID: Maurice Patterson, male   DOB: Jan 15, 1956, 63 y.o.   MRN: [id number]  [facility] PROGRESS NOTE  Maurice Patterson:[id number] DOB: 1956/05/25 DOA: 07/22/2018 PCP: [name], MD  HPI/Subjective:  Afebrile.  Sleeps a lot.  On 2 L oxygen.  Able to communicate some today.  Wanted temperature turned up in the room.  Objective: Vitals:   09/02/18 2140 09/03/18 0356  BP:  138/67  Pulse:  66  Resp:  18  Temp:  97.6 F (36.4 C)  SpO2: 98% 99%    Filed Weights   08/29/18 0403 08/30/18 0500 09/01/18 1553  Weight: 60.4 kg 66 kg 61.9 kg    ROS: Review of Systems  Unable to perform ROS: Acuity of condition   Exam: Physical Exam  Constitutional: He appears lethargic.  HENT:  Nose: No mucosal edema.  Mouth/Throat: No oropharyngeal exudate.  Dry mouth  Eyes: Pupils are equal, round, and reactive to light. Conjunctivae and lids are normal.  Neck: No thyromegaly present.  Cardiovascular: S1 normal and S2 normal.  Murmur heard. Respiratory: He has decreased breath sounds in the right lower field and the left lower field. He has no wheezes. He has rhonchi in the right lower field and the left lower field. He has no rales.  GI: Soft. Bowel sounds are normal. There is no abdominal tenderness.  Musculoskeletal:     Right ankle: He exhibits no swelling.     Left ankle: He exhibits no swelling.  Neurological: He appears lethargic.  Arm stiff to movement.  Skin: Skin is warm. No rash noted.  Psychiatric:  Lethargic      Data Reviewed: Basic Metabolic Panel: Recent Labs  Lab 08/29/18 0631 08/31/18 0403 09/01/18 0537 09/02/18 1416  NA 137 135 136 138  K 3.0* 3.5 3.7 4.2  CL 106 102 102 105  CO2 24 25 27 25   GLUCOSE 141* 133* 166* 169*  BUN 8 9 10 13   CREATININE 0.39* 0.53* 0.48* 0.58*  CALCIUM 7.6* 8.2* 8.0* 8.9  MG  --  1.8  --   --   PHOS  --  2.5  --   --    Liver Function Tests: Recent Labs  Lab 08/29/18 0631  AST 23  ALT 134*  ALKPHOS  148*  BILITOT 0.7  PROT 5.5*  ALBUMIN 2.6*    Recent Labs  Lab 08/29/18 0631  AMMONIA 24   CBC: Recent Labs  Lab 08/29/18 0631 08/30/18 0203 09/01/18 0537 09/02/18 1416  WBC  --  8.5 5.7 6.7  HGB 10.8* 12.4* 12.0* 13.6  HCT  --  37.9* 36.3* 42.6  MCV  --  92.2 91.0 92.2  PLT  --  153 181 254   BNP (last 3 results) Recent Labs    07/22/18 2239 08/03/18 1120  BNP 40.0 95.0    CBG: Recent Labs  Lab 09/02/18 2011 09/03/18 0014 09/03/18 0351 09/03/18 0743 09/03/18 1221  GLUCAP 221* 199* 134* 195* 224*    Recent Results (from the past 240 hour(s))  CULTURE, BLOOD (ROUTINE X 2) w Reflex to ID Panel     Status: None   Collection Time: 08/25/18 12:32 AM  Result Value Ref Range Status   Specimen Description BLOOD RIGHT FATTY CASTS  Final   Special Requests   Final    BOTTLES DRAWN AEROBIC AND ANAEROBIC Blood Culture adequate volume   Culture   Final    NO GROWTH 5 DAYS Performed at [facility], [address] [place], [place] [address]    Report Status 08/30/2018 FINAL  Final  CULTURE, BLOOD (ROUTINE X 2) w Reflex to ID Panel     Status: None   Collection Time: 08/25/18 12:32 AM  Result Value Ref Range Status   Specimen Description BLOOD RIGHT HAND  Final   Special Requests   Final    BOTTLES DRAWN AEROBIC AND ANAEROBIC Blood Culture adequate volume   Culture   Final    NO GROWTH 5 DAYS Performed at [facility], [address] [place], [place] [address]    Report Status 08/30/2018 FINAL  Final  Culture, respiratory     Status: None   Collection Time: 08/25/18  6:13 AM  Result Value Ref Range Status   Specimen Description   Final    TRACHEAL ASPIRATE Performed at [facility], [address] [place], [place] [address]    Special Requests   Final    NONE Performed at [facility], [address], [place], [place] [address]    Gram Stain   Final    MODERATE WBC PRESENT, PREDOMINANTLY PMN RARE SQUAMOUS  EPITHELIAL CELLS PRESENT FEW GRAM POSITIVE COCCI RARE YEAST    Culture   Final    MODERATE Consistent with normal respiratory flora. Performed at [facility], [place] [address] [place], [place] [address]    Report Status 08/27/2018 FINAL  Final      Scheduled Meds: . aspirin  81 mg Per Tube Daily  . budesonide (PULMICORT) nebulizer solution  0.25 mg Nebulization BID  . chlorhexidine  15 mL Mouth/Throat BID  . cholecalciferol  1,000 Units Per Tube Daily  . clopidogrel  75 mg Per Tube Daily  . donepezil  10 mg Per Tube BID  . enoxaparin (LOVENOX) injection  40 mg Subcutaneous Q24H  . famotidine  20 mg Oral BID  . free water  180 mL Per Tube Q4H  . insulin aspart  0-15 Units Subcutaneous Q4H  . ipratropium-albuterol  3 mL Nebulization TID  . memantine  10 mg Per Tube BID  . metoCLOPramide  5 mg Oral BID  . metoprolol tartrate  25 mg Per Tube BID  . pantoprazole sodium  40 mg Per Tube Daily  . polyethylene glycol  17 g Per Tube Daily  . potassium chloride  20 mEq Per Tube BID  . senna-docusate  1 tablet Per Tube Daily   Continuous Infusions: . sodium chloride 250 mL (08/30/18 2252)  . feeding supplement (GLUCERNA 1.5 CAL) 1,000 mL (09/03/18 1041)    Assessment/Plan:  1. Acute metabolic encephalopathy. MRI of the brain negative for acute process.  EEG generalized slowing.  LP negative.  Has diagnosis of dementia with intermittent sundowning and delirium at home.  Presently significant inpatient delirium.  No sedative medications on board. 2. Clinical sepsis with aspiration pneumonia.  Patient finished course of antibiotics again.  Still high risk for aspiration with altered mental status. 3. Elevated liver function test.  AST and ALT very elevated on presentation and now almost in the normal range on the ALT.  AST normalized. 4. Dysphasia with worsening encephalopathy.  PEG tube placed.  Tube feedings at goal 5. Hypokalemia.  Replaced yesterday 6. Hyponatremia has  improved.  7. Type 2 diabetes.  Sliding scale only 8. Hypertension and on metoprolol 9. Gastric ulcer seen on endoscopy.  On Pepcid  Likely discharge to skilled nursing facility on Monday.  Discontinue tele sitter  Code Status:     Code  Status Orders  (From admission, onward)         Start     Ordered   07/23/18 0123  Full code  Continuous     07/23/18 0122        Code Status History    Date Active Date Inactive Code Status Order ID Comments User Context   04/05/2016 0304 04/05/2016 1851 Full Code [id number]  Harvie Bridge, DO Inpatient   10/19/2015 0103 10/20/2015 1102 Full Code [id number]  Harrie Foreman, MD Inpatient   08/21/2015 1912 08/23/2015 1657 Full Code [id number]  Gladstone Lighter, MD Inpatient   02/19/2015 1627 02/20/2015 2054 Full Code [id number]  Henreitta Leber, MD Inpatient      Consultants:  Gastroenterology  Time spent: 30 minutes  [facility]

## 2018-09-03 NOTE — Progress Notes (Signed)
Pt has been more awake and interactive today than yesterday. He was not impulsive. No new issues. Telesitter DC'd  today.

## 2018-09-04 ENCOUNTER — Inpatient Hospital Stay: Payer: PPO

## 2018-09-04 DIAGNOSIS — Z434 Encounter for attention to other artificial openings of digestive tract: Secondary | ICD-10-CM

## 2018-09-04 LAB — GLUCOSE, CAPILLARY
Glucose-Capillary: 148 mg/dL — ABNORMAL HIGH (ref 70–99)
Glucose-Capillary: 149 mg/dL — ABNORMAL HIGH (ref 70–99)
Glucose-Capillary: 156 mg/dL — ABNORMAL HIGH (ref 70–99)
Glucose-Capillary: 199 mg/dL — ABNORMAL HIGH (ref 70–99)
Glucose-Capillary: 206 mg/dL — ABNORMAL HIGH (ref 70–99)
Glucose-Capillary: 229 mg/dL — ABNORMAL HIGH (ref 70–99)

## 2018-09-04 MED ORDER — IOPAMIDOL (ISOVUE-300) INJECTION 61%: 20.0000 mL | Freq: Once | INTRAVENOUS | Status: DC | PRN

## 2018-09-04 MED ORDER — SODIUM CHLORIDE (PF) 0.9 % IJ SOLN: 10.0000 mL | INTRAMUSCULAR | Status: DC | PRN

## 2018-09-04 NOTE — Care Management Important Message (Signed)
Copy of signed Medicare IM left with patient in room. 

## 2018-09-04 NOTE — Clinical Social Work Note (Addendum)
Patient pulled out his feeding tube last evening and will be going to have it replaced. CSW reached out to Toyah at [facility] to request prior auth.  Shela Leff MSW,LCSW [phone]

## 2018-09-04 NOTE — Progress Notes (Signed)
Pt placed on breathing treatment. Educated pt's sister at bedside to call desk when breathing treatment is complete. Called by Network engineer that family notified desk that the breathing treatment was complete. Entered room to find pt's sister at bedside. Sister states that she "turned off the breathing treatment because it was done". Educated sister that she should not touch the oxygen/neb equipment due to the fact that she did not reconnect pt's [place] to O2. Reconnected pt to [place] and O2 and pt's O2 sat 99% on 2.5 liters. Pt's sister states "I did not know you had to do that". Again educated sister that's why she should wait for staff to perform tasks. Notified CN of same.

## 2018-09-04 NOTE — Progress Notes (Signed)
PT Cancellation Note  Patient Details Name: Maurice Patterson MRN: [id number] DOB: Nov 29, 1955   Cancelled Treatment:    Reason Eval/Treat Not Completed: Patient at procedure or test/unavailable, will re-attempt at later time/day.   Kateland Leisinger 09/04/2018, 1:41 PM

## 2018-09-04 NOTE — Progress Notes (Signed)
Patient ID: Maurice Patterson, male   DOB: 07/13/1956, 63 y.o.   MRN: [id number]  [facility] PROGRESS NOTE  Maurice Patterson:[id number] DOB: November 27, 1955 DOA: 07/22/2018 PCP: [name], MD  HPI/Subjective:  Continues to be confused.  Agitated overnight.  Pulled his PEG tube out.  Sister at bedside.  Afebrile.  On 2 L oxygen.  Objective: Vitals:   09/04/18 0435 09/04/18 1140  BP: (!) 124/98 129/82  Pulse: 71 87  Resp: 20 19  Temp: 97.9 F (36.6 C) (!) 97.4 F (36.3 C)  SpO2: 99% 98%    Filed Weights   08/29/18 0403 08/30/18 0500 09/01/18 1553  Weight: 60.4 kg 66 kg 61.9 kg    ROS: Review of Systems  Unable to perform ROS: Acuity of condition   Exam: Physical Exam  Constitutional: He appears lethargic.  HENT:  Nose: No mucosal edema.  Mouth/Throat: No oropharyngeal exudate.  Dry mouth  Eyes: Pupils are equal, round, and reactive to light. Conjunctivae and lids are normal.  Neck: No thyromegaly present.  Cardiovascular: S1 normal and S2 normal.  Murmur heard. Respiratory: He has decreased breath sounds in the right lower field and the left lower field. He has no wheezes. He has rhonchi in the right lower field and the left lower field. He has no rales.  GI: Soft. Bowel sounds are normal. There is no abdominal tenderness.  Musculoskeletal:     Right ankle: He exhibits no swelling.     Left ankle: He exhibits no swelling.  Neurological: He appears lethargic.  Arm stiff to movement.  Skin: Skin is warm. No rash noted.  Psychiatric:  Lethargic      Data Reviewed: Basic Metabolic Panel: Recent Labs  Lab 08/29/18 0631 08/31/18 0403 09/01/18 0537 09/02/18 1416  NA 137 135 136 138  K 3.0* 3.5 3.7 4.2  CL 106 102 102 105  CO2 24 25 27 25   GLUCOSE 141* 133* 166* 169*  BUN 8 9 10 13   CREATININE 0.39* 0.53* 0.48* 0.58*  CALCIUM 7.6* 8.2* 8.0* 8.9  MG  --  1.8  --   --   PHOS  --  2.5  --   --    Liver Function Tests: Recent Labs  Lab  08/29/18 0631  AST 23  ALT 134*  ALKPHOS 148*  BILITOT 0.7  PROT 5.5*  ALBUMIN 2.6*    Recent Labs  Lab 08/29/18 0631  AMMONIA 24   CBC: Recent Labs  Lab 08/29/18 0631 08/30/18 0203 09/01/18 0537 09/02/18 1416  WBC  --  8.5 5.7 6.7  HGB 10.8* 12.4* 12.0* 13.6  HCT  --  37.9* 36.3* 42.6  MCV  --  92.2 91.0 92.2  PLT  --  153 181 254   BNP (last 3 results) Recent Labs    07/22/18 2239 08/03/18 1120  BNP 40.0 95.0    CBG: Recent Labs  Lab 09/03/18 1947 09/04/18 0023 09/04/18 0417 09/04/18 0743 09/04/18 1138  GLUCAP 191* 229* 199* 149* 148*    No results found for this or any previous visit (from the past 240 hour(s)).    Scheduled Meds: . aspirin  81 mg Per Tube Daily  . budesonide (PULMICORT) nebulizer solution  0.25 mg Nebulization BID  . chlorhexidine  15 mL Mouth/Throat BID  . cholecalciferol  1,000 Units Per Tube Daily  . clopidogrel  75 mg Per Tube Daily  . donepezil  10 mg Per Tube BID  . famotidine  20 mg Oral BID  .  free water  180 mL Per Tube Q4H  . insulin aspart  0-15 Units Subcutaneous Q4H  . ipratropium-albuterol  3 mL Nebulization TID  . memantine  10 mg Per Tube BID  . metoCLOPramide  5 mg Oral BID  . metoprolol tartrate  25 mg Per Tube BID  . pantoprazole sodium  40 mg Per Tube Daily  . polyethylene glycol  17 g Per Tube Daily  . potassium chloride  20 mEq Per Tube BID  . senna-docusate  1 tablet Per Tube Daily   Continuous Infusions: . sodium chloride 250 mL (08/30/18 2252)  . feeding supplement (GLUCERNA 1.5 CAL) 1,000 mL (09/03/18 1041)    Assessment/Plan:  Dysphagia Due to acute encephalopathy and worsening dementia. PEG tube pulled out by patient. Consulted IR for replacing PEG and discussed with Dr. Kathlene Cote. 1. Acute metabolic encephalopathy. MRI of the brain negative for acute process.  EEG generalized slowing.  LP negative.  Has diagnosis of dementia with intermittent sundowning and delirium at home.  Presently  significant inpatient delirium.  No sedative medications on board. 2. Clinical sepsis with aspiration pneumonia.  Patient finished course of antibiotics again.  Still high risk for aspiration with altered mental status. 3. Elevated liver function test.  AST and ALT very elevated on presentation and now almost in the normal range on the ALT.  AST normalized. 4. Hypokalemia.  Replaced  5. Hyponatremia has improved.  6. Type 2 diabetes.  Sliding scale only 7. Hypertension and on metoprolol 8. Gastric ulcer seen on endoscopy.  Pepcid   Code Status:     Code Status Orders  (From admission, onward)         Start     Ordered   07/23/18 0123  Full code  Continuous     07/23/18 0122        Code Status History    Date Active Date Inactive Code Status Order ID Comments User Context   04/05/2016 0304 04/05/2016 1851 Full Code [id number]  Harvie Bridge, DO Inpatient   10/19/2015 0103 10/20/2015 1102 Full Code [id number]  Harrie Foreman, MD Inpatient   08/21/2015 1912 08/23/2015 1657 Full Code [id number]  Gladstone Lighter, MD Inpatient   02/19/2015 1627 02/20/2015 2054 Full Code [id number]  Henreitta Leber, MD Inpatient      Consultants:  Gastroenterology  Time spent: 30 minutes  [facility]

## 2018-09-04 NOTE — Progress Notes (Signed)
Called and updated patient's wife Brynden Thune over the phone.  She is very upset and frustrated that patient has pulled out his PEG tube.  She is recounting multiple concerns she has since patient's admission to date.  Patient was alert and awake able to communicate his needs and not agitated yesterday.  Tele sitter was discontinued.  I do not think a tele sitter would have helped with the PEG tube being pulled out. Wife is planning on talking to administration, Retail buyer, her attorney. Discussed with IR for PEG replacement.

## 2018-09-04 NOTE — Progress Notes (Signed)
Jonathon Bellows , MD [address] [place], [place], [place], [id number] [place], [place], [place], [address] Phone: [phone]  Fax: [phone]   BOYSIE BONEBRAKE is being followed for G tube  Day   Subjective: Pulled out G-tube early this morning.  Family member called our office and requested [place] to come and see the patient.  Member by the side when I saw the patient.   Objective: Vital signs in last 24 hours: Vitals:   09/03/18 1948 09/03/18 2033 09/04/18 0435 09/04/18 1140  BP: 122/80  (!) 124/98 129/82  Pulse: 79  71 87  Resp: 16  20 19   Temp: 98.1 F (36.7 C)  97.9 F (36.6 C) (!) 97.4 F (36.3 C)  TempSrc: Oral   Axillary  SpO2: 92% 98% 99% 98%  Weight:      Height:       Weight change:   Intake/Output Summary (Last 24 hours) at 09/04/2018 1155 Last data filed at 09/04/2018 6283 Gross per 24 hour  Intake 440 ml  Output 700 ml  Net -260 ml     Exam: Heart:: Regular rate and rhythm, S1S2 present or without murmur or extra heart sounds Lungs: normal, clear to auscultation and clear to auscultation and percussion Abdomen: G-tube site has completely closed up no orifice noted to insert a temporary Foley catheter.  No bleeding.  No guarding or abdominal tenderness bowel sounds are present. He was restless and was not allowing me to examine his abdomen had to be held.  Lab Results: @LABTEST2 @ Micro Results: No results found for this or any previous visit (from the past 240 hour(s)). Studies/Results: No results found. Medications: I have reviewed the patient's current medications. Scheduled Meds: . aspirin  81 mg Per Tube Daily  . budesonide (PULMICORT) nebulizer solution  0.25 mg Nebulization BID  . chlorhexidine  15 mL Mouth/Throat BID  . cholecalciferol  1,000 Units Per Tube Daily  . clopidogrel  75 mg Per Tube Daily  . donepezil  10 mg Per Tube BID  . famotidine  20 mg Oral BID  . free water  180 mL Per Tube Q4H  . insulin aspart  0-15  Units Subcutaneous Q4H  . ipratropium-albuterol  3 mL Nebulization TID  . memantine  10 mg Per Tube BID  . metoCLOPramide  5 mg Oral BID  . metoprolol tartrate  25 mg Per Tube BID  . pantoprazole sodium  40 mg Per Tube Daily  . polyethylene glycol  17 g Per Tube Daily  . potassium chloride  20 mEq Per Tube BID  . senna-docusate  1 tablet Per Tube Daily   Continuous Infusions: . sodium chloride 250 mL (08/30/18 2252)  . feeding supplement (GLUCERNA 1.5 CAL) 1,000 mL (09/03/18 1041)   PRN Meds:.sodium chloride, acetaminophen, acetaminophen **OR** [DISCONTINUED] acetaminophen, albuterol, chlorproMAZINE, guaiFENesin-dextromethorphan, labetalol, [DISCONTINUED] ondansetron **OR** ondansetron (ZOFRAN) IV   Assessment: Principal Problem:   CAP (community acquired pneumonia) Active Problems:   Uncontrolled type 2 diabetes mellitus with hyperglycemia, with long-term current use of insulin (HCC)   Essential hypertension   Alzheimer's dementia ([place])   GERD (gastroesophageal reflux disease)   Fever   Acute respiratory failure (HCC)   Dysphagia   Aspiration pneumonia ([place])   Palliative care encounter   Pressure injury of skin  Have been asked to see him as he pulled out his G-tube earlier this morning.  It was placed endoscopy a week back.  G-tube site is completely closed up.  I discussed with the family member and said due to ongoing agitation would not recommend a repeat G-tube to be placed at this point of time.  If it were to be placed, it  is very likely that he could pull it out again.  I suggest to commence on NG tube feeds and when his agitation has improved significantly and felt safe to perform another G-tube placement then can be placed via IR.  I will sign off.  Please call me if any further GI concerns or questions.  We would like to thank you for the opportunity to participate in the care of JAYLEN KNOPE.    LOS: 36 days   Jonathon Bellows, MD 09/04/2018, 11:55 AM

## 2018-09-04 NOTE — Progress Notes (Signed)
Attempted to clean pt's mouth and use moisturizer for pt. Pt refusing to open his mouth. Pt's sister pulled pt's mouth open with her hand and used the swab to clean and moisturize pt's mouth. Pt covered up his mouth with his hands and gown after procedure. Pt kep stating "no no no no". Pt did not respond when pt asked his name. Pt asked to put his hands down and pt stated "no".

## 2018-09-04 NOTE — Procedures (Signed)
Interventional Radiology Procedure Note  Procedure: Replacement of gastrostomy tube  Complications: None  Estimated Blood Loss: None  Findings: Able to catheterize gastric lumen via abdominal skin opening and place 18 Fr balloon retention gastrostomy tube into stomach with tip in body of stomach. OK to use immediately.   Recommend: Abdominal binder, if available, may help prevent future pulling out of g-tubes.  Venetia Night. Kathlene Cote, M.D Pager:  [phone]

## 2018-09-04 NOTE — Progress Notes (Signed)
Called pt's wife to inform her that pt pulled Peg tube out overnight. Pt's wife upset and asking to speak with CN. Transferred call to [employer].

## 2018-09-04 NOTE — Progress Notes (Signed)
Pt more alert this shift, trying to communicate with staff during care but sleeping between care. Whilst rounding on Pt this AM Pt had taken his gown off, nasal cannula off and pulled his Peg tube out. MD made aware.

## 2018-09-05 LAB — GLUCOSE, CAPILLARY
GLUCOSE-CAPILLARY: 236 mg/dL — AB (ref 70–99)
Glucose-Capillary: 140 mg/dL — ABNORMAL HIGH (ref 70–99)
Glucose-Capillary: 181 mg/dL — ABNORMAL HIGH (ref 70–99)
Glucose-Capillary: 208 mg/dL — ABNORMAL HIGH (ref 70–99)
Glucose-Capillary: 226 mg/dL — ABNORMAL HIGH (ref 70–99)
Glucose-Capillary: 227 mg/dL — ABNORMAL HIGH (ref 70–99)
Glucose-Capillary: 233 mg/dL — ABNORMAL HIGH (ref 70–99)
Glucose-Capillary: 294 mg/dL — ABNORMAL HIGH (ref 70–99)

## 2018-09-05 MED ORDER — INSULIN ASPART 100 UNIT/ML ~~LOC~~ SOLN
3.0000 [IU] | SUBCUTANEOUS | Status: DC
  Administered 2018-09-05 – 2018-09-08 (×14): 3 [IU] via SUBCUTANEOUS
  Filled 2018-09-05 (×13): qty 1

## 2018-09-05 MED ORDER — ENOXAPARIN SODIUM 40 MG/0.4ML ~~LOC~~ SOLN
40.0000 mg | SUBCUTANEOUS | Status: DC
  Administered 2018-09-05 – 2018-09-07 (×3): 40 mg via SUBCUTANEOUS
  Filled 2018-09-05 (×3): qty 0.4

## 2018-09-05 NOTE — Progress Notes (Addendum)
Nutrition Follow-up  DOCUMENTATION CODES:   Not applicable  INTERVENTION:   Resume Glucerna 1.5 Cal at goal rate of 55 mL/hr (1320 mL goal daily volume) per tube.   Regimen provides 1980 kcal, 109 grams of protein, 1003 mL H2O daily.  Provide free water flush of 180 ml via gravity/pump q4hrs for a total of 2083 mL H2O daily.  Goal TF regimen meets 100% RDIs for vitamins/minerals.  NUTRITION DIAGNOSIS:   Inadequate oral intake related to (acute metabolic encephalopathy) as evidenced by meal completion < 25%.  Ongoing - addressing with TF regimen.  GOAL:   Patient will meet greater than or equal to 90% of their needs  Met with TF regimen.  MONITOR:   PO intake, Supplement acceptance, Labs, Weight trends, Skin, I & O's  ASSESSMENT:   63 year old male with PMHx of seizures, dementia, arthritis, HTN, DM, hx TIA, CAD, CHF, HLD, pancreatitis, CKD, hx CVA 08/2017 who is admitted with PNA, acute metabolic encephalopathy with prior history of HSV encephalitis s/p lumbar puncture on 12/4.  Pt s/p PEG placement 1/6- gastric ulcer noted during EGD Pt s/p IR placement of 18 Fr balloon retention gastrostomy tube 1/13   Pt tolerating tube feeds well at goal. Pt removed PEG tube himself yesterday; tube replaced by IR yesterday evening. Plan to resume tube feeds at goal rate. Per chart, pt with multiple weight fluctuations; RD unsure if pt has had any weight loss. Pt with continued hyperglycemia; on sliding scale insulin.       Medications reviewed and include: aspirin, insulin, protonix, miralax, senokot, cholecalciferol, plavix, lovenox, pepcid, reglan, KCl  Labs reviewed: cbgs- 294, 233, 227, 226 x 24 hrs  Diet Order:   Diet Order            Diet NPO time specified  Diet effective now             EDUCATION NEEDS:   No education needs have been identified at this time  Skin:  Skin Assessment: Skin Integrity Issues:(DTI to sacrum; ecchymosis to bilateral arms)  Last BM:   1/13- type 6  Height:   Ht Readings from Last 1 Encounters:  08/28/18 _0  (1.676 m)   Weight:   Wt Readings from Last 1 Encounters:  09/05/18 60.4 kg   Ideal Body Weight:  70 kg  BMI:  Body mass index is 21.49 kg/m.  Estimated Nutritional Needs:   Kcal:  1845-2150 (MSJ x 1.2-1.4)  Protein:  85-105 grams (1.2-1.5 grams/kg)  Fluid:  >1.9L/day   Koleen Distance MS, RD, LDN Pager #- [phone] Office#- [phone] After Hours Pager: [phone]

## 2018-09-05 NOTE — Clinical Social Work Note (Signed)
CSW received auth from [employer] this morning stating that patient's Maurice Patterson was: [phone] and good through Friday. Shela Leff MSW,LCSW [phone]

## 2018-09-05 NOTE — Progress Notes (Signed)
Physical Therapy Treatment Patient Details Name: Maurice Patterson MRN: [id number] DOB: [birth year] Today's Date: 09/05/2018    History of Present Illness 63 y/o male here with pneumonia and respiratory distress has been intubated (on and off) and ultimately extubated 12/23.  Pt with altered mental status/encephalopathy limiting interaction, now with PEG tube.    PT Comments    Pt continues to be very inconsistent with his ability to interact, stay awake, actively participate with exercises, etc.  He appeared to be breathing easier (less congested today) and though he was still lethargic and needed almost constant cuing to "stay on task" he did not overtly fall asleep as much this session.  Again he remains very limited in his ability to do AROM, especially with any consistency.  Pt did not appear awake and engaged enough to make sitting up a therapeutic task this date.  Will maintain on PT caseload with attempts to progress him as he is appropriate.   Follow Up Recommendations  SNF     Equipment Recommendations  None recommended by PT    Recommendations for Other Services       Precautions / Restrictions Precautions Precautions: Fall Restrictions Weight Bearing Restrictions: No    Mobility  Bed Mobility               General bed mobility comments: Pt not as awake or interative as last time this PT saw him (max assist to sit), deferred bed mobility today  Transfers                    Ambulation/Gait                 Stairs             Wheelchair Mobility    Modified Rankin (Stroke Patients Only)       Balance                                            Cognition Arousal/Alertness: Lethargic Behavior During Therapy: Flat affect Overall Cognitive Status: Impaired/Different from baseline                                 General Comments: Pt with less falling asleep during session today (though still  lethargic), did at times try to verbally communicate with great difficulty (PT unable to "translate" sounds).  Showed some definite voluntary effort with exercises, but remains very inconsistent and needs excessive cuing      Exercises General Exercises - Lower Extremity Ankle Circles/Pumps: PROM;10 reps(3 bouts of calf stretches b/l) Heel Slides: AAROM;PROM;15 reps(pt with some ability to do a few repeated reps AAROM) Hip ABduction/ADduction: AAROM;PROM;15 reps(again inconsistent level of assist from pt t/o reps b/l) Straight Leg Raises: 10 reps;PROM(unable to show A/AAROM with SLRs this date)    General Comments        Pertinent Vitals/Pain Pain Assessment: (per usual, shows some discomfort with calf stretches)    Home Living                      Prior Function            PT Goals (current goals can now be found in the care plan section) Progress towards PT goals: Not progressing toward goals - comment(pt with  less AAROM effort today, still very lethargic)    Frequency    Min 2X/week      PT Plan Current plan remains appropriate    Co-evaluation              AM-PAC PT "6 Clicks" Mobility   Outcome Measure  Help needed turning from your back to your side while in a flat bed without using bedrails?: Total Help needed moving from lying on your back to sitting on the side of a flat bed without using bedrails?: Total Help needed moving to and from a bed to a chair (including a wheelchair)?: Total Help needed standing up from a chair using your arms (e.g., wheelchair or bedside chair)?: Total Help needed to walk in hospital room?: Total Help needed climbing 3-5 steps with a railing? : Total 6 Click Score: 6    End of Session   Activity Tolerance: Patient limited by lethargy Patient left: with bed alarm set;with call bell/phone within reach Nurse Communication: Mobility status PT Visit Diagnosis: Other abnormalities of gait and mobility (R26.89);Muscle  weakness (generalized) (M62.81)     Time: 7915-0569 PT Time Calculation (min) (ACUTE ONLY): 24 min  Charges:  $Therapeutic Exercise: 23-37 mins                     Kreg Shropshire, DPT 09/05/2018, 3:29 PM

## 2018-09-05 NOTE — Progress Notes (Signed)
Inpatient Diabetes Program Recommendations  AACE/ADA: New Consensus Statement on Inpatient Glycemic Control (2015)  Target Ranges:  Prepandial:   less than 140 mg/dL      Peak postprandial:   less than 180 mg/dL (1-2 hours)      Critically ill patients:  140 - 180 mg/dL   Results for GEHRIG, PATRAS (MRN [id number]) as of 09/05/2018 10:14  Ref. Range 09/03/2018 00:14 09/03/2018 03:51 09/03/2018 07:43 09/03/2018 12:21 09/03/2018 16:15 09/03/2018 19:47  Glucose-Capillary Latest Ref Range: 70 - 99 mg/dL 199 (H)  3 units NOVOLOG 134 (H)  2 units NOVOLOG 195 (H)  3 units NOVOLOG 224 (H)  5 units NOVOLOG 178 (H)  3 units NOVOLOG 191 (H)  3 units NOVOLOG    Results for KYJUAN, GAUSE (MRN [id number]) as of 09/05/2018 10:14  Ref. Range 09/04/2018 00:23 09/04/2018 04:17 09/04/2018 07:43 09/04/2018 11:38 09/04/2018 16:04 09/04/2018 20:17  Glucose-Capillary Latest Ref Range: 70 - 99 mg/dL 229 (H)  5 units NOVOLOG 199 (H)  3 units NOVOLOG 149 (H)  2 units NOVOLOG 148 (H)  2 units NOVOLOG 156 (H)  3 units NOVOLOG 206 (H)   Results for DEACON, GADBOIS (MRN [id number]) as of 09/05/2018 10:14  Ref. Range 09/05/2018 00:26 09/05/2018 06:23 09/05/2018 07:36  Glucose-Capillary Latest Ref Range: 70 - 99 mg/dL 294 (H)  8 units NOVOLOG 233 (H)  5 units NOVOLOG 227 (H)    Home DM Meds: NPH Insulin 32 units BID                             Regular Insulin 34 units TID with meals                             Regular Insulin 8 units for every 20 mg/dl >200 mg/dl  Current Orders: Novolog Moderate Correction Scale/ SSI (0-15 units) Q4 hours    Glucerna Tube feeds running 55cc/hour (restarted yesterday at 3pm after PEG replaced).  CBGs running >200 mg/dl.     MD- May consider starting low dose Novolog tube feed coverage to cover the carbohydrates patient receiving in his continuous tube feedings:  Recommend Novolog 3 units Q4 hours  HOLD if tube feeds HELD or Stopped for any  reason     --Will follow patient during hospitalization--  Wyn Quaker RN, MSN, CDE Diabetes Coordinator Inpatient Glycemic Control Team Team Pager: [phone] (8a-5p)

## 2018-09-05 NOTE — Clinical Social Work Note (Addendum)
Physician asked CSW to call patient's wife due to her concerns regarding discharge. CSW contacted patient's wife and she proceeded to vent to [place] regarding all of the hospital concerns that she has already informed the unit Director and the physician. CSW allowed her to vent. CSW explained that she could appeal the discharge once the order was in and CSW explained to her the appeals process. Patient's wife seemed to relax more after hearing this information. CSW will follow up with her tomorrow. Shela Leff MSW,LCSW [phone]

## 2018-09-05 NOTE — Progress Notes (Signed)
Called patients wife Nesta Kimple to update.  She is still upset about multiple issues. We discussed about patient's PEG tube replacement and  tolerating tube feeds needing to go to SNF. She is unable to get off work and wants him to stay till Friday in the hospital. I tried to discuss reasons for discharge but she was not willing to listen. Constantly interrupting. She grouped all hospital staff as " idiots" and repeated "yes I said idiots". She wanted to talk to the Education officer, museum. She also proceeded to call me a 'smart ass' when I asked her if she would let me speak to explain his dysphagia and progress. She interrupted me again and finally hung up the phone.  I called and updated Brayton Layman the social worker about Patients's wife requesting call from Education officer, museum.

## 2018-09-05 NOTE — Progress Notes (Signed)
Patient ID: Maurice Patterson, male   DOB: [birth year], 63 y.o.   MRN: [id number]  [facility] PROGRESS NOTE  Maurice Patterson:[id number] DOB: 1956/08/07 DOA: 07/22/2018 PCP: [name], MD  HPI/Subjective:  Continues to be confused.  Agitated overnight.  Pulled his PEG tube out.  Sister at bedside.  Afebrile.  On 2 L oxygen.  Objective: Vitals:   09/05/18 1129 09/05/18 1352  BP: 134/84   Pulse: 79 82  Resp: 20 20  Temp: 97.9 F (36.6 C)   SpO2: 99% 97%    Filed Weights   08/30/18 0500 09/01/18 1553 09/05/18 0500  Weight: 66 kg 61.9 kg 60.4 kg    ROS: Review of Systems  Unable to perform ROS: Acuity of condition   Exam: Physical Exam  Constitutional: He appears lethargic.  HENT:  Nose: No mucosal edema.  Mouth/Throat: No oropharyngeal exudate.  Dry mouth  Eyes: Pupils are equal, round, and reactive to light. Conjunctivae and lids are normal.  Neck: No thyromegaly present.  Cardiovascular: S1 normal and S2 normal.  Murmur heard. Respiratory: He has decreased breath sounds in the right lower field and the left lower field. He has no wheezes. He has rhonchi in the right lower field and the left lower field. He has no rales.  GI: Soft. Bowel sounds are normal. There is no abdominal tenderness.  PEG tube  Musculoskeletal:     Right ankle: He exhibits no swelling.     Left ankle: He exhibits no swelling.  Neurological: He appears lethargic.  Skin: Skin is warm. No rash noted.  Psychiatric:  Lethargic      Data Reviewed: Basic Metabolic Panel: Recent Labs  Lab 08/31/18 0403 09/01/18 0537 09/02/18 1416  NA 135 136 138  K 3.5 3.7 4.2  CL 102 102 105  CO2 25 27 25   GLUCOSE 133* 166* 169*  BUN 9 10 13   CREATININE 0.53* 0.48* 0.58*  CALCIUM 8.2* 8.0* 8.9  MG 1.8  --   --   PHOS 2.5  --   --    Liver Function Tests: No results for input(s): AST, ALT, ALKPHOS, BILITOT, PROT, ALBUMIN in the last 168 hours.  No results for input(s): AMMONIA in  the last 168 hours. CBC: Recent Labs  Lab 08/30/18 0203 09/01/18 0537 09/02/18 1416  WBC 8.5 5.7 6.7  HGB 12.4* 12.0* 13.6  HCT 37.9* 36.3* 42.6  MCV 92.2 91.0 92.2  PLT 153 181 254   BNP (last 3 results) Recent Labs    07/22/18 2239 08/03/18 1120  BNP 40.0 95.0    CBG: Recent Labs  Lab 09/05/18 0026 09/05/18 0623 09/05/18 0736 09/05/18 1125 09/05/18 1350  GLUCAP 294* 233* 227* 226* 208*    No results found for this or any previous visit (from the past 240 hour(s)).    Scheduled Meds: . aspirin  81 mg Per Tube Daily  . budesonide (PULMICORT) nebulizer solution  0.25 mg Nebulization BID  . chlorhexidine  15 mL Mouth/Throat BID  . cholecalciferol  1,000 Units Per Tube Daily  . clopidogrel  75 mg Per Tube Daily  . donepezil  10 mg Per Tube BID  . enoxaparin (LOVENOX) injection  40 mg Subcutaneous Q24H  . famotidine  20 mg Oral BID  . free water  180 mL Per Tube Q4H  . insulin aspart  0-15 Units Subcutaneous Q4H  . insulin aspart  3 Units Subcutaneous Q4H  . ipratropium-albuterol  3 mL Nebulization TID  . memantine  10  mg Per Tube BID  . metoCLOPramide  5 mg Oral BID  . metoprolol tartrate  25 mg Per Tube BID  . pantoprazole sodium  40 mg Per Tube Daily  . polyethylene glycol  17 g Per Tube Daily  . potassium chloride  20 mEq Per Tube BID  . senna-docusate  1 tablet Per Tube Daily   Continuous Infusions: . sodium chloride 250 mL (08/30/18 2252)  . feeding supplement (GLUCERNA 1.5 CAL) 1,000 mL (09/05/18 1413)    Assessment/Plan:   1. Acute metabolic encephalopathy. MRI of the brain negative for acute process.  EEG generalized slowing.  LP negative.  Has diagnosis of dementia with intermittent sundowning and delirium at home.  Presently significant inpatient delirium.  No sedative medications on board. 2. Clinical sepsis with aspiration pneumonia.  Patient finished course of antibiotics again.  Still high risk for aspiration with altered mental status. 3.  Dysphagia Due to acute encephalopathy and worsening dementia. PEG tube pulled out by patient on 09/04/2017 and replaced same day. He is back on tube feeds and tolerating well. 3. Elevated liver function test.  AST and ALT very elevated on presentation and trended down to normal. 4. Hypokalemia.  Replaced  5. Hyponatremia has improved.  6. Type 2 diabetes.  Sliding scale only. Added Novolog 3 units Q 4 hrs 7. Hypertension - on metoprolol 8. Gastric ulcer seen on endoscopy.  Pepcid 9. DVT prophylaxis - Lovenox   Code Status:     Code Status Orders  (From admission, onward)         Start     Ordered   07/23/18 0123  Full code  Continuous     07/23/18 0122        Code Status History    Date Active Date Inactive Code Status Order ID Comments User Context   04/05/2016 0304 04/05/2016 1851 Full Code [id number]  Harvie Bridge, DO Inpatient   10/19/2015 0103 10/20/2015 1102 Full Code [id number]  Harrie Foreman, MD Inpatient   08/21/2015 1912 08/23/2015 1657 Full Code [id number]  Gladstone Lighter, MD Inpatient   02/19/2015 1627 02/20/2015 2054 Full Code [id number]  Henreitta Leber, MD Inpatient      Consultants:  Gastroenterology  Time spent: 30 minutes  [facility]

## 2018-09-06 ENCOUNTER — Telehealth: Payer: Self-pay | Admitting: Gastroenterology

## 2018-09-06 LAB — GLUCOSE, CAPILLARY
GLUCOSE-CAPILLARY: 194 mg/dL — AB (ref 70–99)
Glucose-Capillary: 275 mg/dL — ABNORMAL HIGH (ref 70–99)
Glucose-Capillary: 225 mg/dL — ABNORMAL HIGH (ref 70–99)
Glucose-Capillary: 107 mg/dL — ABNORMAL HIGH (ref 70–99)
Glucose-Capillary: 236 mg/dL — ABNORMAL HIGH (ref 70–99)

## 2018-09-06 MED ORDER — ASPIRIN 81 MG PO CHEW: 81.0000 mg | CHEWABLE_TABLET | Freq: Every day | ORAL | Status: DC

## 2018-09-06 MED ORDER — CHOLECALCIFEROL 10 MCG/ML (400 UNIT/ML) PO LIQD: 1000.0000 [IU] | Freq: Every day | ORAL | Status: AC

## 2018-09-06 MED ORDER — GUAIFENESIN-DM 100-10 MG/5ML PO SYRP: 5.0000 mL | ORAL_SOLUTION | ORAL | 0 refills | Status: DC | PRN

## 2018-09-06 MED ORDER — ONDANSETRON HCL 4 MG/2ML IJ SOLN: 4.0000 mg | Freq: Four times a day (QID) | INTRAMUSCULAR | 0 refills | Status: DC | PRN

## 2018-09-06 MED ORDER — INSULIN ASPART 100 UNIT/ML ~~LOC~~ SOLN: 3.0000 [IU] | Freq: Four times a day (QID) | SUBCUTANEOUS | 11 refills | Status: DC

## 2018-09-06 MED ORDER — PANTOPRAZOLE SODIUM 40 MG PO PACK: 40.0000 mg | PACK | Freq: Every day | ORAL | Status: DC

## 2018-09-06 MED ORDER — SENNOSIDES-DOCUSATE SODIUM 8.6-50 MG PO TABS: 1.0000 | ORAL_TABLET | Freq: Every day | ORAL | Status: DC

## 2018-09-06 MED ORDER — INSULIN GLARGINE 100 UNIT/ML ~~LOC~~ SOLN: 12.0000 [IU] | Freq: Every day | SUBCUTANEOUS | 11 refills | Status: DC

## 2018-09-06 MED ORDER — CLOPIDOGREL BISULFATE 75 MG PO TABS: 75.0000 mg | ORAL_TABLET | Freq: Every day | ORAL | Status: DC

## 2018-09-06 MED ORDER — DONEPEZIL HCL 10 MG PO TABS: 10.0000 mg | ORAL_TABLET | Freq: Two times a day (BID) | ORAL | Status: DC

## 2018-09-06 MED ORDER — FREE WATER: 180.0000 mL | Status: DC

## 2018-09-06 MED ORDER — ACETAMINOPHEN 500 MG PO TABS: 500.0000 mg | ORAL_TABLET | Freq: Four times a day (QID) | ORAL | 0 refills | Status: DC | PRN

## 2018-09-06 MED ORDER — POTASSIUM CHLORIDE 20 MEQ PO PACK: 20.0000 meq | PACK | Freq: Every day | ORAL | Status: DC

## 2018-09-06 MED ORDER — ALBUTEROL SULFATE (2.5 MG/3ML) 0.083% IN NEBU: 2.5000 mg | INHALATION_SOLUTION | RESPIRATORY_TRACT | 12 refills | Status: DC | PRN

## 2018-09-06 MED ORDER — INSULIN GLARGINE 100 UNIT/ML ~~LOC~~ SOLN
12.0000 [IU] | Freq: Every day | SUBCUTANEOUS | Status: DC
  Administered 2018-09-06 – 2018-09-08 (×3): 12 [IU] via SUBCUTANEOUS
  Filled 2018-09-06 (×4): qty 0.12

## 2018-09-06 MED ORDER — GLUCERNA 1.5 CAL PO LIQD: 1000.0000 mL | ORAL | Status: DC

## 2018-09-06 MED ORDER — MEMANTINE HCL 10 MG PO TABS: 10.0000 mg | ORAL_TABLET | Freq: Two times a day (BID) | ORAL | Status: DC

## 2018-09-06 MED ORDER — BUDESONIDE 0.25 MG/2ML IN SUSP: 0.2500 mg | Freq: Two times a day (BID) | RESPIRATORY_TRACT | 12 refills | Status: DC

## 2018-09-06 MED ORDER — CHLORHEXIDINE GLUCONATE 0.12 % MT SOLN: 15.0000 mL | Freq: Two times a day (BID) | OROMUCOSAL | 0 refills | Status: DC

## 2018-09-06 MED ORDER — POLYETHYLENE GLYCOL 3350 17 G PO PACK: 17.0000 g | PACK | Freq: Every day | ORAL | 0 refills | Status: DC

## 2018-09-06 MED ORDER — METOPROLOL TARTRATE 25 MG PO TABS: 25.0000 mg | ORAL_TABLET | Freq: Two times a day (BID) | ORAL | Status: DC

## 2018-09-06 MED ORDER — IPRATROPIUM-ALBUTEROL 0.5-2.5 (3) MG/3ML IN SOLN: 3.0000 mL | Freq: Three times a day (TID) | RESPIRATORY_TRACT | Status: DC

## 2018-09-06 NOTE — Progress Notes (Addendum)
Inpatient Diabetes Program Recommendations  AACE/ADA: New Consensus Statement on Inpatient Glycemic Control (2015)  Target Ranges:  Prepandial:   less than 140 mg/dL      Peak postprandial:   less than 180 mg/dL (1-2 hours)      Critically ill patients:  140 - 180 mg/dL   Results for Maurice Patterson, Maurice Patterson (MRN [id number]) as of 09/06/2018 09:36  Ref. Range 09/05/2018 00:26 09/05/2018 06:23 09/05/2018 07:36 09/05/2018 11:25 09/05/2018 13:50 09/05/2018 16:28 09/05/2018 20:28  Glucose-Capillary Latest Ref Range: 70 - 99 mg/dL 294 (H)  8 units NOVOLOG  233 (H)  5 units NOVOLOG  227 (H) 226 (H)  5 units NOVOLOG  208 (H)  3 units NOVOLOG  181 (H)  6 units NOVOLOG  140 (H)  5 units NOVOLOG    Results for Maurice Patterson, Maurice Patterson (MRN [id number]) as of 09/06/2018 09:36  Ref. Range 09/05/2018 23:40 09/06/2018 04:29 09/06/2018 07:40  Glucose-Capillary Latest Ref Range: 70 - 99 mg/dL 236 (H)  11 units NOVOLOG  194 (H)  6 units NOVOLOG  275 (H)     Home DM Meds:NPH Insulin 32 units BID Regular Insulin 34 units TID with meals Regular Insulin 8 units for every 20 mg/dl >200 mg/dl  Current Orders: Novolog Moderate Correction Scale/ SSI (0-15 units) Q4 hours      Novolog 3 units Q4 hours (Tube feed coverage)     Glucerna Tube feeds running 55cc/hour.  Note Novolog Tube feed coverage started yesterday afternoon.  CBGs still running >200 mg/dl this AM.  Patient does take basal insulin at home (NPH 32 units BID).    MD- May consider starting low dose basal insulin as well:  Recommend Lantus 15 units QHS (25% total home dose of basal insulin to start)     --Will follow patient during hospitalization--  Wyn Quaker RN, MSN, CDE Diabetes Coordinator Inpatient Glycemic Control Team Team Pager: [phone] (8a-5p)

## 2018-09-06 NOTE — Progress Notes (Signed)
I had called patient's wife Matson Welch in the morning to update regarding plan and discharge.  Left voicemail.  Social worker informed me that wife called back and was unable to get in touch with me. I called her back to discuss the plan.  Updated her regarding patient's tube feeds, unchanged mental status and need for discharge to skilled nursing facility.  Patient continues to bring up many issues during the hospital stay. She inquired about remote rib fractures found on chest x-ray.  Explained that I have discussed with radiology and these rib fractures were also seen in chest x-ray many months back and are not acute.  Unknown when they happened.  She questioned why patient could not stay till Friday when she would be off work.  She also asked if I was discharging the patient because she was rude to me on the phone yesterday.  I explained to her regarding prolonged stay and increased risk of healthcare acquired infections.  I also explained to her that the tele sitter at this time has been placed in the room with her request.  She continued to went regarding other hospital staff.  She she mentions that she recorded my phone conversation with her.  I said that patient would not be discharged unless the bed alarm was removed and he could eat and meet nutritional goals on his own.  I denied saying that.  Patient question why I could not wait another 2 days to discharge the patient when she would get off work.  I tried to explain again the reasoning.  She went on to call me a 'hot head' multiple times.  After speaking out frustrated about the fact that she appealed the discharge she called me ' son of a bitch' and repeated that 2 more times.  On saying that she cannot swear at me and talk to me that way patient repeated this wearing and said I should not lay hands on her husband.  Multiple staff members including 2 social workers and nurse in the room while I spoke with her over the phone.  I will look on  the schedule and transfer care of the patient to a different physician.

## 2018-09-06 NOTE — Telephone Encounter (Signed)
Pt wife is calling to find out about pt feeding tube, the Hospital is trying to discharge pt and he had cracked ribs she did not know about she has a lot of questions    cb  [phone]

## 2018-09-06 NOTE — Progress Notes (Signed)
Patient ID: Maurice Patterson, male   DOB: 02/04/1956, 63 y.o.   MRN: [id number]  [facility] PROGRESS NOTE  Maurice Patterson:[id number] DOB: 07/28/1956 DOA: 07/22/2018 PCP: [name], MD  HPI/Subjective:  Continues to be confused. On 2-3 L O2. Sister at bedside. Tolerating tube feeds Plan is to d/c to SNF later today  Objective: Vitals:   09/06/18 1155 09/06/18 1357  BP: (!) 142/95 130/80  Pulse: 92 82  Resp: 20 20  Temp: (!) 97.2 F (36.2 C) 97.7 F (36.5 C)  SpO2: 100% 99%    Filed Weights   08/30/18 0500 09/01/18 1553 09/05/18 0500  Weight: 66 kg 61.9 kg 60.4 kg    ROS: Review of Systems  Unable to perform ROS: Acuity of condition   Exam: Physical Exam  Constitutional: He appears lethargic.  HENT:  Nose: No mucosal edema.  Mouth/Throat: No oropharyngeal exudate.  Dry mouth  Eyes: Pupils are equal, round, and reactive to light. Conjunctivae and lids are normal.  Neck: No thyromegaly present.  Cardiovascular: S1 normal and S2 normal.  Murmur heard. Respiratory: He has decreased breath sounds in the right lower field and the left lower field. He has no wheezes. He has rhonchi in the right lower field and the left lower field. He has no rales.  GI: Soft. Bowel sounds are normal. There is no abdominal tenderness.  PEG tube  Musculoskeletal:     Right ankle: He exhibits no swelling.     Left ankle: He exhibits no swelling.  Neurological: He appears lethargic.  Skin: Skin is warm. No rash noted.  Psychiatric:  Lethargic      Data Reviewed: Basic Metabolic Panel: Recent Labs  Lab 08/31/18 0403 09/01/18 0537 09/02/18 1416  NA 135 136 138  K 3.5 3.7 4.2  CL 102 102 105  CO2 25 27 25   GLUCOSE 133* 166* 169*  BUN 9 10 13   CREATININE 0.53* 0.48* 0.58*  CALCIUM 8.2* 8.0* 8.9  MG 1.8  --   --   PHOS 2.5  --   --    Liver Function Tests: No results for input(s): AST, ALT, ALKPHOS, BILITOT, PROT, ALBUMIN in the last 168 hours.  No  results for input(s): AMMONIA in the last 168 hours. CBC: Recent Labs  Lab 09/01/18 0537 09/02/18 1416  WBC 5.7 6.7  HGB 12.0* 13.6  HCT 36.3* 42.6  MCV 91.0 92.2  PLT 181 254   BNP (last 3 results) Recent Labs    07/22/18 2239 08/03/18 1120  BNP 40.0 95.0    CBG: Recent Labs  Lab 09/05/18 2028 09/05/18 2340 09/06/18 0429 09/06/18 0740 09/06/18 1150  GLUCAP 140* 236* 194* 275* 225*    No results found for this or any previous visit (from the past 240 hour(s)).    Scheduled Meds: . aspirin  81 mg Per Tube Daily  . budesonide (PULMICORT) nebulizer solution  0.25 mg Nebulization BID  . chlorhexidine  15 mL Mouth/Throat BID  . cholecalciferol  1,000 Units Per Tube Daily  . clopidogrel  75 mg Per Tube Daily  . donepezil  10 mg Per Tube BID  . enoxaparin (LOVENOX) injection  40 mg Subcutaneous Q24H  . famotidine  20 mg Oral BID  . free water  180 mL Per Tube Q4H  . insulin aspart  0-15 Units Subcutaneous Q4H  . insulin aspart  3 Units Subcutaneous Q4H  . insulin glargine  12 Units Subcutaneous Daily  . ipratropium-albuterol  3 mL Nebulization TID  .  memantine  10 mg Per Tube BID  . metoprolol tartrate  25 mg Per Tube BID  . pantoprazole sodium  40 mg Per Tube Daily  . polyethylene glycol  17 g Per Tube Daily  . potassium chloride  20 mEq Per Tube BID  . senna-docusate  1 tablet Per Tube Daily   Continuous Infusions: . sodium chloride 250 mL (08/30/18 2252)  . feeding supplement (GLUCERNA 1.5 CAL) 1,000 mL (09/06/18 1312)    Assessment/Plan:   1. Acute metabolic encephalopathy. MRI of the brain negative for acute process.  EEG generalized slowing.  LP negative.  Has diagnosis of dementia with intermittent sundowning and delirium at home.  Presently  inpatient delirium.  No sedative medications on board. 2. Clinical sepsis with aspiration pneumonia has resolved.  Patient finished course of antibiotics again.  Still high risk for aspiration with altered mental  status. Continues to be at high risk of aspiration. 3. Dysphagia Due to acute encephalopathy and worsening dementia. PEG tube pulled out by patient on 09/04/2017 and replaced same day. He is back on tube feeds and tolerating well.  Did not tolerate bolus feeds and needs to be on continuous feeds 3. Elevated liver function test.  AST and ALT very elevated on presentation and trended down to normal. 4. Hypokalemia.  Replaced  5. Hyponatremia has improved.  6. Type 2 diabetes.   Added Novolog 3 units Q 4 hrs. add Lantus 12 units daily 7. Hypertension - on metoprolol 8. Gastric ulcer seen on endoscopy.  Protonix 9. DVT prophylaxis - Lovenox   Code Status:     Code Status Orders  (From admission, onward)         Start     Ordered   07/23/18 0123  Full code  Continuous     07/23/18 0122        Code Status History    Date Active Date Inactive Code Status Order ID Comments User Context   04/05/2016 0304 04/05/2016 1851 Full Code [id number]  Harvie Bridge, DO Inpatient   10/19/2015 0103 10/20/2015 1102 Full Code [id number]  Harrie Foreman, MD Inpatient   08/21/2015 1912 08/23/2015 1657 Full Code [id number]  Gladstone Lighter, MD Inpatient   02/19/2015 1627 02/20/2015 2054 Full Code [id number]  Henreitta Leber, MD Inpatient      Consultants:  Gastroenterology  Time spent: 30 minutes  [facility]

## 2018-09-06 NOTE — Discharge Summary (Signed)
[facility] NAME: Maurice Patterson    MR#:  [id number]  DATE OF BIRTH:  1955/09/16  DATE OF ADMISSION:  07/22/2018 ADMITTING PHYSICIAN: Lance Coon, MD  DATE OF DISCHARGE: 09/06/2017  PRIMARY CARE PHYSICIAN: [name], MD   ADMISSION DIAGNOSIS:  Hypokalemia [E87.6] Community acquired pneumonia of right upper lobe of lung ([place]) [J18.1]  DISCHARGE DIAGNOSIS:  Principal Problem:   CAP (community acquired pneumonia) Active Problems:   Uncontrolled type 2 diabetes mellitus with hyperglycemia, with long-term current use of insulin ([place])   Essential hypertension   Alzheimer's dementia ([place])   GERD (gastroesophageal reflux disease)   Fever   Acute respiratory failure ([place])   Dysphagia   Aspiration pneumonia ([place])   Palliative care encounter   Pressure injury of skin   SECONDARY DIAGNOSIS:   Past Medical History:  Diagnosis Date  . Acute MI ([place])   . Arthritis   . Back pain   . CAD (coronary artery disease)   . CHF (congestive heart failure) ([place])   . Chronic kidney disease   . Degenerative lumbar disc   . Dementia ([place])   . Diabetes mellitus without complication ([place])   . GERD (gastroesophageal reflux disease)   . Hernia of abdominal cavity   . Hyperlipemia   . Hypertension   . Malignant intraductal papillary mucinous tumor of pancreas ([place])   . Memory loss   . Pancreatitis   . Seizures ([place])    staring spells  . Shingles   . Stroke ([place]) 08/2017  . TIA (transient ischemic attack)      ADMITTING HISTORY  HISTORY OF PRESENT ILLNESS:  Maurice Patterson  is a 63 y.o. male who presents with chief complaint as above.  Patient presents with confusion, weakness, poor appetite for the past several days.  He has had some cough.  He is unable to contribute much information to his history, history is provided by family at bedside.  Work-up here in the ED today shows pneumonia.  Hospitalist were called for  admission  HOSPITAL COURSE:   1. Acute metabolic encephalopathy. MRI of the brain negative for acute process.  EEG generalized slowing.  LP negative.  Has diagnosis of dementia with intermittent sundowning and delirium at home.  Presently has significant inpatient delirium.  No sedative medications on board.  Patient was started on Vimpat by neurology due to concern for possible seizures.  But was stopped at request of his wife.  Seroquel stopped. 2. sepsis with aspiration pneumonia-resolved.  Patient finished course of antibiotics.  Still high risk for aspiration with altered mental status, dysphagia and ongoing tube feeds 3. Dysphagia Due to acute encephalopathy and worsening dementia. PEG tube pulled out by patient on 09/04/2017 and replaced same day. He is back on tube feeds and tolerating well. He has not tolerated bolus feeding and needs to be continued on continuous tube feeding with Glucerna 1.5 at 55 mL/h.  Dietitian at skilled nursing facility needs to follow-up for nutritional needs. 3. Elevated liver function test.  AST and ALT very elevated on presentation and trended down to normal. 4. Hypokalemia.  Replaced  5. Hyponatremia has improved.  6. Type 2 diabetes.  Sliding scale only. Added Novolog 3 units Q 4 hrs 7. Hypertension - on metoprolol 8. Gastric ulcer seen on endoscopy.    Protonix 9. History of CVA.  MRI of the brain did not show anything acute this admission.  Continue aspirin and Plavix through the tube.  High risk for aspiration, pneumonia, readmission.  Patient will be n.p.o.  All feeding and medications only through PEG tube.  Will need PRN suctioning as he is unable to clear oral secretions.  Speech therapy will need to follow for further swallow evaluations at skilled nursing facility once patient is more awake.  Hopefully patient's mental status will improve once he gets into a routine outside the hospital.  Will need physical therapy, speech following. Will need  follow-up with Dr. Manuella Ghazi of neurology as outpatient  Discharge to skilled nursing facility  CONSULTS OBTAINED:  Treatment Team:  Catarina Hartshorn, MD Leotis Pain, MD  DRUG ALLERGIES:   Allergies  Allergen Reactions  . Hydrocodone Anaphylaxis  . Morphine Other (See Comments)    Loss of memory  . Ambien [Zolpidem] Other (See Comments)    delirium    . Brilinta [Ticagrelor] Other (See Comments)    Stroke   . Flexeril [Cyclobenzaprine] Other (See Comments)    delerium   . Flunitrazepam Other (See Comments)    ROHYPNOL (hallucinations)  . Haldol [Haloperidol Lactate] Other (See Comments)    delerium   . Levetiracetam Diarrhea and Other (See Comments)    Unable to walk  . Lorazepam Hives  . Risperdal [Risperidone] Other (See Comments)    Delirium   . Trazodone Other (See Comments)    Delirium Can take in low doses   . Benadryl [Diphenhydramine Hcl (Sleep)] Rash  . Penicillins Rash    Mouth ulcers Has patient had a PCN reaction causing immediate rash, facial/tongue/throat swelling, SOB or lightheadedness with hypotension: Yes Has patient had a PCN reaction causing severe rash involving mucus membranes or skin necrosis: No Has patient had a PCN reaction that required hospitalization No Has patient had a PCN reaction occurring within the last 10 years: Yes If all of the above answers are "NO", then may proceed with Cephalosporin use.    DISCHARGE MEDICATIONS:   Allergies as of 09/06/2018      Reactions   Hydrocodone Anaphylaxis   Morphine Other (See Comments)   Loss of memory   Ambien [zolpidem] Other (See Comments)   delirium    Brilinta [ticagrelor] Other (See Comments)   Stroke   Flexeril [cyclobenzaprine] Other (See Comments)   delerium   Flunitrazepam Other (See Comments)   ROHYPNOL (hallucinations)   Haldol [haloperidol Lactate] Other (See Comments)   delerium   Levetiracetam Diarrhea, Other (See Comments)   Unable to walk   Lorazepam Hives    Risperdal [risperidone] Other (See Comments)   Delirium   Trazodone Other (See Comments)   Delirium Can take in low doses    Benadryl [diphenhydramine Hcl (sleep)] Rash   Penicillins Rash   Mouth ulcers Has patient had a PCN reaction causing immediate rash, facial/tongue/throat swelling, SOB or lightheadedness with hypotension: Yes Has patient had a PCN reaction causing severe rash involving mucus membranes or skin necrosis: No Has patient had a PCN reaction that required hospitalization No Has patient had a PCN reaction occurring within the last 10 years: Yes If all of the above answers are "NO", then may proceed with Cephalosporin use.      Medication List    STOP taking these medications   aspirin EC 81 MG tablet Replaced by:  aspirin 81 MG chewable tablet   BOTOX 100 units Solr injection Generic drug:  botulinum toxin Type A   cholecalciferol 1000 units tablet Commonly known as:  VITAMIN D Replaced by:  cholecalciferol 10 MCG/ML Liqd   colestipol  1 g tablet Commonly known as:  COLESTID   diphenoxylate-atropine 2.5-0.025 MG tablet Commonly known as:  LOMOTIL   furosemide 40 MG tablet Commonly known as:  LASIX   insulin NPH Human 100 UNIT/ML injection Commonly known as:  HUMULIN N,NOVOLIN N   insulin regular 100 units/mL injection Commonly known as:  NOVOLIN R,HUMULIN R   isosorbide mononitrate 30 MG 24 hr tablet Commonly known as:  IMDUR   lamoTRIgine 100 MG tablet Commonly known as:  LAMICTAL   lidocaine 5 % Commonly known as:  LIDODERM   losartan 100 MG tablet Commonly known as:  COZAAR   metoprolol succinate 50 MG 24 hr tablet Commonly known as:  TOPROL-XL   nitroGLYCERIN 0.4 MG SL tablet Commonly known as:  NITROSTAT   pantoprazole 40 MG tablet Commonly known as:  PROTONIX Replaced by:  pantoprazole sodium 40 mg/20 mL Pack   potassium chloride 10 MEQ tablet Commonly known as:  K-DUR Replaced by:  potassium chloride 20 MEQ packet     TAKE  these medications   acetaminophen 500 MG tablet Commonly known as:  TYLENOL Place 1 tablet (500 mg total) into feeding tube every 6 (six) hours as needed for mild pain. What changed:    how to take this  when to take this  reasons to take this   albuterol (2.5 MG/3ML) 0.083% nebulizer solution Commonly known as:  PROVENTIL Take 3 mLs (2.5 mg total) by nebulization every 3 (three) hours as needed for wheezing or shortness of breath.   aspirin 81 MG chewable tablet Place 1 tablet (81 mg total) into feeding tube daily. Start taking on:  September 07, 2018 Replaces:  aspirin EC 81 MG tablet   budesonide 0.25 MG/2ML nebulizer solution Commonly known as:  PULMICORT Take 2 mLs (0.25 mg total) by nebulization 2 (two) times daily.   chlorhexidine 0.12 % solution Commonly known as:  PERIDEX Use as directed 15 mLs in the mouth or throat 2 (two) times daily.   cholecalciferol 10 MCG/ML Liqd Commonly known as:  D-VI-SOL Place 2.5 mLs (1,000 Units total) into feeding tube daily. Start taking on:  September 07, 2018 Replaces:  cholecalciferol 1000 units tablet   clopidogrel 75 MG tablet Commonly known as:  PLAVIX Place 1 tablet (75 mg total) into feeding tube daily. Start taking on:  September 07, 2018 What changed:  how to take this   donepezil 10 MG tablet Commonly known as:  ARICEPT Place 1 tablet (10 mg total) into feeding tube 2 (two) times daily. What changed:  how to take this   feeding supplement (GLUCERNA 1.5 CAL) Liqd Place 1,000 mLs into feeding tube continuous.   free water Soln Place 180 mLs into feeding tube every 4 (four) hours.   guaiFENesin-dextromethorphan 100-10 MG/5ML syrup Commonly known as:  ROBITUSSIN DM Place 5 mLs into feeding tube every 4 (four) hours as needed for cough.   insulin aspart 100 UNIT/ML injection Commonly known as:  novoLOG Inject 3 Units into the skin every 6 (six) hours.   insulin glargine 100 UNIT/ML injection Commonly known as:   LANTUS Inject 0.12 mLs (12 Units total) into the skin daily. Start taking on:  September 07, 2018   ipratropium-albuterol 0.5-2.5 (3) MG/3ML Soln Commonly known as:  DUONEB Take 3 mLs by nebulization 3 (three) times daily.   memantine 10 MG tablet Commonly known as:  NAMENDA Place 1 tablet (10 mg total) into feeding tube 2 (two) times daily. What changed:  how to take this  metoprolol tartrate 25 MG tablet Commonly known as:  LOPRESSOR Place 1 tablet (25 mg total) into feeding tube 2 (two) times daily.   ondansetron 4 MG/2ML Soln injection Commonly known as:  ZOFRAN Inject 2 mLs (4 mg total) into the vein every 6 (six) hours as needed for nausea.   pantoprazole sodium 40 mg/20 mL Pack Commonly known as:  PROTONIX Place 20 mLs (40 mg total) into feeding tube daily. Start taking on:  September 07, 2018 Replaces:  pantoprazole 40 MG tablet   polyethylene glycol packet Commonly known as:  MIRALAX / GLYCOLAX Place 17 g into feeding tube daily. Start taking on:  September 07, 2018   potassium chloride 20 MEQ packet Commonly known as:  KLOR-CON Place 20 mEq into feeding tube daily. Replaces:  potassium chloride 10 MEQ tablet   senna-docusate 8.6-50 MG tablet Commonly known as:  Senokot-S Place 1 tablet into feeding tube daily. Start taking on:  September 07, 2018       Today   VITAL SIGNS:  Blood pressure (!) 142/95, pulse 92, temperature (!) 97.2 F (36.2 C), temperature source Axillary, resp. rate 20, height 5\' 6"  (1.676 m), weight 60.4 kg, SpO2 100 %.  I/O:    Intake/Output Summary (Last 24 hours) at 09/06/2018 1237 Last data filed at 09/06/2018 0015 Gross per 24 hour  Intake -  Output 800 ml  Net -800 ml    PHYSICAL EXAMINATION:  Physical Exam  GENERAL:  63 y.o.-year-old patient lying in the bed with no acute distress.  LUNGS: bilateral ronchi No use of accessory muscles of respiration.  CARDIOVASCULAR: S1, S2  ABDOMEN: Soft, non-tender, non-distended. Bowel  sounds present. PEG tube in place. NEUROLOGIC: Moves all 4 extremities.  Motor 5-/5 in upper extremities and 4/5 in lower extremities PSYCHIATRIC: The patient is confused. Follows some commands  DATA REVIEW:   CBC Recent Labs  Lab 09/02/18 1416  WBC 6.7  HGB 13.6  HCT 42.6  PLT 254    Chemistries  Recent Labs  Lab 08/31/18 0403  09/02/18 1416  NA 135   < > 138  K 3.5   < > 4.2  CL 102   < > 105  CO2 25   < > 25  GLUCOSE 133*   < > 169*  BUN 9   < > 13  CREATININE 0.53*   < > 0.58*  CALCIUM 8.2*   < > 8.9  MG 1.8  --   --    < > = values in this interval not displayed.    Cardiac Enzymes No results for input(s): TROPONINI in the last 168 hours.  Microbiology Results  Results for orders placed or performed during the hospital encounter of 07/22/18  Urine culture     Status: None   Collection Time: 07/22/18 11:03 PM  Result Value Ref Range Status   Specimen Description   Final    URINE, RANDOM Performed at [facility], [address], [place], [place] [address]    Special Requests   Final    NONE Performed at [facility], [address], [place], [place] [address]    Culture   Final    NO GROWTH Performed at [facility], [place] [address] [place], [place] [address]    Report Status 07/24/2018 FINAL  Final  CULTURE, BLOOD (ROUTINE X 2) w Reflex to ID Panel     Status: None   Collection Time: 07/23/18 10:06 AM  Result Value Ref Range  Status   Specimen Description BLOOD BLOOD LEFT ARM  Final   Special Requests   Final    BOTTLES DRAWN AEROBIC AND ANAEROBIC Blood Culture adequate volume   Culture   Final    NO GROWTH 5 DAYS Performed at [facility], [address] [place], [place] [address]    Report Status 07/28/2018 FINAL  Final  CULTURE, BLOOD (ROUTINE X 2) w Reflex to ID Panel     Status: None   Collection Time: 07/23/18 10:17 AM  Result Value Ref Range Status   Specimen Description BLOOD BLOOD LEFT WRIST  Final    Special Requests   Final    BOTTLES DRAWN AEROBIC AND ANAEROBIC Blood Culture adequate volume   Culture   Final    NO GROWTH 5 DAYS Performed at [facility], [address] [place], [place] [address]    Report Status 07/28/2018 FINAL  Final  Gram stain     Status: None   Collection Time: 07/26/18  3:11 PM  Result Value Ref Range Status   Specimen Description CSF  Final   Special Requests NONE  Final   Gram Stain   Final    NO ORGANISMS SEEN WBC SEEN Performed at [facility], [address], [place], [place] [address]    Report Status 07/26/2018 FINAL  Final  CSF culture     Status: None   Collection Time: 07/26/18  3:11 PM  Result Value Ref Range Status   Specimen Description   Final    CSF Performed at [facility], [address], [place], [place] [address]    Special Requests   Final    NONE Performed at [facility], [address] [place], [place] [address]    Gram Stain   Final    NO ORGANISMS SEEN WBC SEEN NO RBC SEEN Performed at [facility], [address] [place], [place] [address]    Culture   Final    NO GROWTH 3 DAYS Performed at [facility], [place] [address], [place], [place] [address]    Report Status 07/30/2018 FINAL  Final  Fungus Culture With Stain     Status: None   Collection Time: 07/26/18  3:11 PM  Result Value Ref Range Status   Fungus Stain Final report  Final   Fungus (Mycology) Culture Final report  Final    Comment: (NOTE) Performed At: [facility] [place], [place] [id number] Rush Farmer MD [id number]    Fungal Source CSF  Final    Comment: Performed at [facility], [address] [place], [place] [address]  Fungus Culture Result     Status: None   Collection Time: 07/26/18  3:11 PM  Result Value Ref Range Status   Result 1 Comment  Final    Comment: (NOTE) KOH/Calcofluor preparation:  no fungus observed. Performed At: [facility] [place], [place] [id number] Rush Farmer MD [id number]   Fungal organism reflex     Status: None   Collection Time: 07/26/18  3:11 PM  Result Value Ref Range Status   Fungal result 1 Comment  Final    Comment: (NOTE) No yeast or mold isolated after 4 weeks. Performed At: [facility] [place], [place] [id number] Rush Farmer MD XN:[id number]   CULTURE, BLOOD (ROUTINE X 2) w Reflex to ID Panel     Status: None   Collection Time: 07/29/18  6:14 AM  Result Value Ref Range Status   Specimen Description BLOOD LEFT FOREARM  Final   Special Requests   Final    BOTTLES DRAWN AEROBIC AND ANAEROBIC Blood Culture adequate volume   Culture   Final    NO GROWTH 5 DAYS Performed at [facility], [address] [place], [place] [address]    Report Status 08/03/2018 FINAL  Final  CULTURE, BLOOD (ROUTINE X 2) w Reflex to ID Panel     Status: None   Collection Time: 07/29/18  6:17 AM  Result Value Ref Range Status   Specimen Description BLOOD LEFT HAND  Final   Special Requests   Final    BOTTLES DRAWN AEROBIC AND ANAEROBIC Blood Culture adequate volume   Culture   Final    NO GROWTH 5 DAYS Performed at [facility], [address], [place], [place] [address]    Report Status 08/03/2018 FINAL  Final  MRSA PCR Screening     Status: None   Collection Time: 07/29/18  4:18 PM  Result Value Ref Range Status   MRSA by PCR NEGATIVE NEGATIVE Final    Comment:        The GeneXpert MRSA Assay (FDA approved for NASAL specimens only), is one component of a comprehensive MRSA colonization surveillance program. It is not intended to diagnose MRSA infection nor to guide or monitor treatment for MRSA infections. Performed at [facility], [address] [place], [place] [address]   Culture, respiratory     Status: None   Collection Time: 08/01/18  3:27 PM  Result Value Ref Range Status   Specimen  Description   Final    TRACHEAL ASPIRATE Performed at [facility], [address], [place], [place] [address]    Special Requests   Final    NONE Performed at [facility], [address] [place], [place] [address]    Gram Stain   Final    MODERATE WBC PRESENT,BOTH PMN AND MONONUCLEAR RARE GRAM POSITIVE COCCI Performed at [facility], [place] [address], [place], [place] [address]    Culture FEW CANDIDA TROPICALIS  Final   Report Status 08/04/2018 FINAL  Final  Culture, respiratory (non-expectorated)     Status: None   Collection Time: 08/03/18 11:12 AM  Result Value Ref Range Status   Specimen Description   Final    TRACHEAL ASPIRATE Performed at [facility], [address], [place], [place] [address]    Special Requests   Final    NONE Performed at [facility], [place], [place] [address]    Gram Stain   Final    NO WBC SEEN RARE SQUAMOUS EPITHELIAL CELLS PRESENT RARE GRAM POSITIVE COCCI    Culture   Final    FEW Consistent with normal respiratory flora. Performed at [facility], [place] [address], [place], [place] [address]    Report Status 08/06/2018 FINAL  Final  Urine Culture     Status: None   Collection Time: 08/08/18  3:49 AM  Result Value Ref Range Status   Specimen Description   Final    URINE, RANDOM Performed at [facility], [address] [place], [place] [address]    Special Requests   Final    NONE Performed at [facility], [address] [place], [place] [address]    Culture   Final    NO GROWTH Performed at [facility], [place]  [address] [place], [place] [address]    Report Status 08/09/2018 FINAL  Final  CULTURE, BLOOD (ROUTINE X 2) w Reflex to ID Panel     Status: None   Collection Time: 08/25/18 12:32 AM  Result Value Ref Range Status   Specimen Description BLOOD RIGHT FATTY CASTS  Final   Special Requests   Final    BOTTLES DRAWN AEROBIC AND  ANAEROBIC Blood Culture adequate volume   Culture   Final    NO GROWTH 5 DAYS Performed at [facility], [address] [place], [place] [address]    Report Status 08/30/2018 FINAL  Final  CULTURE, BLOOD (ROUTINE X 2) w Reflex to ID Panel     Status: None   Collection Time: 08/25/18 12:32 AM  Result Value Ref Range Status   Specimen Description BLOOD RIGHT HAND  Final   Special Requests   Final    BOTTLES DRAWN AEROBIC AND ANAEROBIC Blood Culture adequate volume   Culture   Final    NO GROWTH 5 DAYS Performed at [facility], [address], [place], [place] [address]    Report Status 08/30/2018 FINAL  Final  Culture, respiratory     Status: None   Collection Time: 08/25/18  6:13 AM  Result Value Ref Range Status   Specimen Description   Final    TRACHEAL ASPIRATE Performed at [facility], [address], [place], [place] [address]    Special Requests   Final    NONE Performed at [facility], [address] [place], [place] [address]    Gram Stain   Final    MODERATE WBC PRESENT, PREDOMINANTLY PMN RARE SQUAMOUS EPITHELIAL CELLS PRESENT FEW GRAM POSITIVE COCCI RARE YEAST    Culture   Final    MODERATE Consistent with normal respiratory flora. Performed at [facility], [place] [address] [place],  [address]    Report Status 08/27/2018 FINAL  Final    RADIOLOGY:  Dg Replc Gastro/colonic Tube Percut W/fluoro  Result Date: 09/04/2018 INDICATION: Patient has inter vertically pulled out a bumper retention gastrostomy tube placed endoscopically on 08/28/2018. Request has been made to try to replace the gastrostomy through the exit site. EXAM: GASTROSTOMY TUBE REPLACEMENT UNDER FLUOROSCOPY MEDICATIONS: None ANESTHESIA/SEDATION: None CONTRAST:  15 mL Isovue-300-administered into the gastric lumen. FLUOROSCOPY TIME:  Fluoroscopy Time: 1 minutes and 12 seconds. 26.1 mGy. COMPLICATIONS: None immediate. PROCEDURE: Informed written  consent was obtained from the patient's wife after a thorough discussion of the procedural risks, benefits and alternatives. All questions were addressed. Maximal Sterile Barrier Technique was utilized including caps, mask, sterile gowns, sterile gloves, sterile drape, hand hygiene and skin antiseptic. A timeout was performed prior to the initiation of the procedure. The gastrostomy exit site was prepped with chlorhexidine. A 5 French catheter was advanced through the skin opening. Contrast was then injected under fluoroscopy. A guidewire was advanced into the gastric lumen. An 82 French balloon retention gastrostomy tube was then advanced over the wire and into the stomach. The retention balloon was inflated with 7 mL of saline. The gastrostomy tube was injected with contrast to confirm position and a fluoroscopic image saved. FINDINGS: The tract from the skin into the gastric lumen was able to be catheterized, allowing placement of a balloon retention catheter. After placement, the tip of the catheter lies in the body of the stomach. The catheter may be used immediately. IMPRESSION: Replacement of gastrostomy with ability to recanalize the recent gastrostomy tube tract  and place an 8 French balloon retention catheter into the gastric lumen. Electronically Signed   By: Aletta Edouard M.D.   On: 09/04/2018 15:36    Follow up with PCP in 1 week.  Management plans discussed with the patient, family and they are in agreement.  CODE STATUS:     Code Status Orders  (From admission, onward)         Start     Ordered   07/23/18 0123  Full code  Continuous     07/23/18 0122        Code Status History    Date Active Date Inactive Code Status Order ID Comments User Context   04/05/2016 0304 04/05/2016 1851 Full Code [id number]  Harvie Bridge, DO Inpatient   10/19/2015 0103 10/20/2015 1102 Full Code [id number]  Harrie Foreman, MD Inpatient   08/21/2015 1912 08/23/2015 1657 Full Code [id number]   Gladstone Lighter, MD Inpatient   02/19/2015 1627 02/20/2015 2054 Full Code [id number]  Henreitta Leber, MD Inpatient      TOTAL TIME TAKING CARE OF THIS PATIENT ON DAY OF DISCHARGE: more than 30 minutes.   Neita Carp M.D on 09/06/2018 at 12:37 PM  Between 7am to 6pm - Pager - [phone]  After 6pm go to [email or web address] - password [email or web address] [facility]  Office  [phone]  CC: Primary care physician; [name], MD  Note: This dictation was prepared with Dragon dictation along with smaller phrase technology. Any transcriptional errors that result from this process are unintentional.

## 2018-09-06 NOTE — Progress Notes (Addendum)
Notified Dr. Darvin Neighbours that upon rounding and administering insulin doing a lung assessment that patient had no chest rise for a brief second and no lung sounds and he began to turn pale after about 3 seconds the patient began snoring again and his color came back and lung sounds were heard with some wheezing. Pt.'s glucose had recently been checked. Will continue to monitor patient. Sats 100% on 3.5 liters of O2 post check of above mentioned episode. Shortly after the above episode I was called by NT and told that the patient was laid back for incontinent care and the tibe feed was placed on hold by another nurse during this time the patient began coughing and the sister who was present at bedside became concerned for patient aspirating. Lung sounds had some wheezing and crackles. Dr. Darvin Neighbours already notified of previous incident and was on his way to assess patient.

## 2018-09-06 NOTE — Progress Notes (Addendum)
Nurse was told by social worker Shela Leff that the patient's wife had called her and stated that the patient was coughing up blood, nurse went to the patient's room where his sister was present and had a paper towel that had a moderated amount of oral secretions thick and clear with a small speck of blood slightly larger then the tip of a pen. Dr. Darvin Neighbours was notified when he came to the room shortly after the episode to assess patient from previous incident where patient was laid back by nurse tech in order to perform incontinent care and began coughing in which the sister became concerned that the patient was aspirating. Nurse also notified Dr. Darvin Neighbours of white patchy areas to patient's tongue in which he assessed during his presence in the patient's room as well. While nurse was in the room assessing patient the wife called the sisters phone and began asking her questions, nurse asked to speak with the wife. When nurse was on the phone with wife she stated " is he ok" nurse stated " yes he is he coughed up a small speck of blood and with his condition this is not uncommon and I have notifeid Dr. Darvin Neighbours and he is on his way". Wife then stated " well he doesn't like me Dr. Darvin Neighbours and are you aware that he has fractured ribs" nurse told wife " yes I am aware that this showed on his imaging" wife then stated " well are the doctors aware and when did this happen"? Nurse told wif " I can not say when this happened and the providers do have access to your husbands medical records and you can speak with them about it" the wife then stated " oh they wont talk to me about it". Nurse then asked the patient's wife who notified her of the fracture of ribs and the wife stated " respiratory did they told me they couldn't do any more treatments because his ribs were fractured". The wife then began stating " well i'm filing an appeal because Dr. 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 The nurse told the patient's wife thank you and ended the call. Will continue to monitor the patient and assist family in meeting needs.

## 2018-09-06 NOTE — Clinical Social Work Note (Signed)
CSW spoke with patient's wife this afternoon and informed her of the discharge and that the physician had attempted to call her and left her a message. Patient's wife claims she had been trying to call him back. Patient's wife wishes to appeal the discharge and CSW explained the process and provided her with the number to call to request the appeal. CSW will await the disposition of the appeal. [place] notified. If patient not discharged by Friday, a new prior auth will need to be started by [place] MSW,LCSW [phone]

## 2018-09-06 NOTE — Care Management (Signed)
Patient's wife is appealing the discharge.  A Detailed Notice of Discharge presented to patient's wife via voicemail.  Left call back number for any questions or concerns.  Copy left at bedside with patient's sister.  Nurse notified not to remove discharge order.  Mon[place], CSW also aware of appeal.

## 2018-09-07 LAB — GLUCOSE, CAPILLARY
Glucose-Capillary: 114 mg/dL — ABNORMAL HIGH (ref 70–99)
Glucose-Capillary: 131 mg/dL — ABNORMAL HIGH (ref 70–99)
Glucose-Capillary: 177 mg/dL — ABNORMAL HIGH (ref 70–99)
Glucose-Capillary: 184 mg/dL — ABNORMAL HIGH (ref 70–99)
Glucose-Capillary: 190 mg/dL — ABNORMAL HIGH (ref 70–99)
Glucose-Capillary: 76 mg/dL (ref 70–99)

## 2018-09-07 NOTE — Clinical Social Work Note (Signed)
CSW continues following as the wife's appealing of the discharge is determined. Shela Leff MSW,LCSW [phone]

## 2018-09-07 NOTE — Telephone Encounter (Signed)
I did not put this feeding tube in.

## 2018-09-07 NOTE — Progress Notes (Signed)
Staff attempted to get the patient out of the bed to the recliner.  The patient required total assist to get from lying to sitting on  the side of the bed.  He was unable to maintain balance while sitting on the side of the bed and would lean back and to the right.  The patient would put his legs out straight and would not bend them so we were unable to help him stand.

## 2018-09-07 NOTE — Progress Notes (Signed)
[facility] NAME: Maurice Patterson    MR#:  [id number]  DATE OF BIRTH:  November 13, 1955  SUBJECTIVE:  CHIEF COMPLAINT:  No chief complaint on file.  - patient appears alert today- following simple commands - His speech is incomprehensible at times.  REVIEW OF SYSTEMS:  Review of Systems  Unable to perform ROS: Mental status change    DRUG ALLERGIES:   Allergies  Allergen Reactions  . Hydrocodone Anaphylaxis  . Morphine Other (See Comments)    Loss of memory  . Ambien [Zolpidem] Other (See Comments)    delirium    . Brilinta [Ticagrelor] Other (See Comments)    Stroke   . Flexeril [Cyclobenzaprine] Other (See Comments)    delerium   . Flunitrazepam Other (See Comments)    ROHYPNOL (hallucinations)  . Haldol [Haloperidol Lactate] Other (See Comments)    delerium   . Levetiracetam Diarrhea and Other (See Comments)    Unable to walk  . Lorazepam Hives  . Risperdal [Risperidone] Other (See Comments)    Delirium   . Trazodone Other (See Comments)    Delirium Can take in low doses   . Benadryl [Diphenhydramine Hcl (Sleep)] Rash  . Penicillins Rash    Mouth ulcers Has patient had a PCN reaction causing immediate rash, facial/tongue/throat swelling, SOB or lightheadedness with hypotension: Yes Has patient had a PCN reaction causing severe rash involving mucus membranes or skin necrosis: No Has patient had a PCN reaction that required hospitalization No Has patient had a PCN reaction occurring within the last 10 years: Yes If all of the above answers are "NO", then may proceed with Cephalosporin use.    VITALS:  Blood pressure 118/62, pulse 64, temperature (!) 97.5 F (36.4 C), temperature source Oral, resp. rate 18, height 5\' 6"  (1.676 m), weight 60.4 kg, SpO2 100 %.  PHYSICAL EXAMINATION:  Physical Exam   GENERAL:  63 y.o.-year-old patient lying in the bed with no acute distress. Appears lethargic- but interacts  when you address him as "joe" EYES: Pupils equal, round, reactive to light and accommodation. No scleral icterus. Extraocular muscles intact.  HEENT: Head atraumatic, normocephalic. Oropharynx and nasopharynx clear.  NECK:  Supple, no jugular venous distention. No thyroid enlargement, no tenderness.  LUNGS: Normal breath sounds bilaterally, no wheezing, rales,rhonchi or crepitation. No use of accessory muscles of respiration. decreased at the bases CARDIOVASCULAR: S1, S2 normal. No murmurs, rubs, or gallops.  ABDOMEN: Soft, nontender, nondistended. Has a PEG tube in place. Bowel sounds present. No organomegaly or mass.  EXTREMITIES: No pedal edema, cyanosis, or clubbing. Hands in mittens NEUROLOGIC: Cranial nerves II through XII are intact. Appears sleepy., No facial droop - Muscle strength 5/5 in all extremities- does follow commands. Sensation intact. Gait not checked.  PSYCHIATRIC: The patient is drowsy-but easily aroused and following simple commands today.  Did tell me his date of birth SKIN: No obvious rash, lesion, or ulcer.    LABORATORY PANEL:   CBC Recent Labs  Lab 09/02/18 1416  WBC 6.7  HGB 13.6  HCT 42.6  PLT 254   ------------------------------------------------------------------------------------------------------------------  Chemistries  Recent Labs  Lab 09/02/18 1416  NA 138  K 4.2  CL 105  CO2 25  GLUCOSE 169*  BUN 13  CREATININE 0.58*  CALCIUM 8.9   ------------------------------------------------------------------------------------------------------------------  Cardiac Enzymes No results for input(s): TROPONINI in the last 168 hours. ------------------------------------------------------------------------------------------------------------------  RADIOLOGY:  No results found.  EKG:   Orders placed or  performed during the hospital encounter of 07/22/18  . EKG 12-Lead  . EKG 12-Lead  . EKG 12-Lead  . EKG 12-Lead  . ED EKG  . ED EKG     ASSESSMENT AND PLAN:   63 year old male with past medical history significant for CAD, CHF, CKD, dementia, diabetes, hypertension and seizure disorder presents to hospital secondary to worsening weakness and poor appetite.  1.  Acute encephalopathy-fluctuating mentation- alert today -Concern for worsening dementia -Patient had MRI with and without contrast on admission that did not show anything acute, EEG for generalized slowing. -Metabolic encephalopathy.  Lumbar puncture also was done and negative for any infections. -Patient was treated empirically for possible herpes encephalitis on admission -Mentation is slightly improved that patient is alert and following some simple commands.  Still not close to baseline according to wife. -Appreciate neurology consult.  Vimpat and Lamictal , Seroquel and Provigil-all discontinued now. - on asa and plavix  2.  Acute hypoxic respiratory failure-patient admitted to the hospital on 07/23/2018, intubated on 07/29/2018 initially, was reintubated again on 08/07/2018. extubated -Aspiration pneumonia and suspected.  Currently extubated.  Finished IV antibiotics  3.  Hypokalemia and hyponatremia.  Improved after replacements  4. Dementia-continue Namenda, aricept.  But independent and interactive at baseline according to wife  5.  Diabetes mellitus-on Lantus and sliding scale insulin  6.   DVT prophylaxis-on Lovenox     Updated wife over the phone today    All the records are reviewed and case discussed with Care Management/Social Workerr. Management plans discussed with the patient, family and they are in agreement.  CODE STATUS: Full code  TOTAL TIME TAKING CARE OF THIS PATIENT: 37 minutes.   POSSIBLE D/C IN 1-2 DAYS, DEPENDING ON CLINICAL CONDITION.   Gladstone Lighter M.D on 09/07/2018 at 10:02 AM  Between 7am to 6pm - Pager - [phone]  After 6pm go to [email or web address] - password [email or web address] [facility]   Office  [phone]  CC: Primary care physician; [name], MD

## 2018-09-07 NOTE — Telephone Encounter (Signed)
Tube placed by IR

## 2018-09-08 DIAGNOSIS — J9602 Acute respiratory failure with hypercapnia: Secondary | ICD-10-CM | POA: Diagnosis not present

## 2018-09-08 DIAGNOSIS — I509 Heart failure, unspecified: Secondary | ICD-10-CM | POA: Diagnosis not present

## 2018-09-08 DIAGNOSIS — Z7982 Long term (current) use of aspirin: Secondary | ICD-10-CM | POA: Diagnosis not present

## 2018-09-08 DIAGNOSIS — R000 Tachycardia, unspecified: Secondary | ICD-10-CM | POA: Diagnosis not present

## 2018-09-08 DIAGNOSIS — Z87891 Personal history of nicotine dependence: Secondary | ICD-10-CM | POA: Diagnosis not present

## 2018-09-08 DIAGNOSIS — J9600 Acute respiratory failure, unspecified whether with hypoxia or hypercapnia: Secondary | ICD-10-CM | POA: Diagnosis not present

## 2018-09-08 DIAGNOSIS — I10 Essential (primary) hypertension: Secondary | ICD-10-CM | POA: Diagnosis not present

## 2018-09-08 DIAGNOSIS — G458 Other transient cerebral ischemic attacks and related syndromes: Secondary | ICD-10-CM | POA: Diagnosis not present

## 2018-09-08 DIAGNOSIS — F0280 Dementia in other diseases classified elsewhere without behavioral disturbance: Secondary | ICD-10-CM | POA: Diagnosis not present

## 2018-09-08 DIAGNOSIS — Z9861 Coronary angioplasty status: Secondary | ICD-10-CM | POA: Diagnosis not present

## 2018-09-08 DIAGNOSIS — E11610 Type 2 diabetes mellitus with diabetic neuropathic arthropathy: Secondary | ICD-10-CM | POA: Diagnosis not present

## 2018-09-08 DIAGNOSIS — D649 Anemia, unspecified: Secondary | ICD-10-CM | POA: Diagnosis not present

## 2018-09-08 DIAGNOSIS — M2550 Pain in unspecified joint: Secondary | ICD-10-CM | POA: Diagnosis not present

## 2018-09-08 DIAGNOSIS — E43 Unspecified severe protein-calorie malnutrition: Secondary | ICD-10-CM | POA: Diagnosis not present

## 2018-09-08 DIAGNOSIS — N189 Chronic kidney disease, unspecified: Secondary | ICD-10-CM | POA: Diagnosis not present

## 2018-09-08 DIAGNOSIS — Z8673 Personal history of transient ischemic attack (TIA), and cerebral infarction without residual deficits: Secondary | ICD-10-CM | POA: Diagnosis not present

## 2018-09-08 DIAGNOSIS — L89306 Pressure-induced deep tissue damage of unspecified buttock: Secondary | ICD-10-CM | POA: Diagnosis not present

## 2018-09-08 DIAGNOSIS — R6520 Severe sepsis without septic shock: Secondary | ICD-10-CM | POA: Diagnosis not present

## 2018-09-08 DIAGNOSIS — K219 Gastro-esophageal reflux disease without esophagitis: Secondary | ICD-10-CM | POA: Diagnosis not present

## 2018-09-08 DIAGNOSIS — Z431 Encounter for attention to gastrostomy: Secondary | ICD-10-CM | POA: Diagnosis not present

## 2018-09-08 DIAGNOSIS — I252 Old myocardial infarction: Secondary | ICD-10-CM | POA: Diagnosis not present

## 2018-09-08 DIAGNOSIS — R498 Other voice and resonance disorders: Secondary | ICD-10-CM | POA: Diagnosis not present

## 2018-09-08 DIAGNOSIS — R918 Other nonspecific abnormal finding of lung field: Secondary | ICD-10-CM | POA: Diagnosis not present

## 2018-09-08 DIAGNOSIS — E1165 Type 2 diabetes mellitus with hyperglycemia: Secondary | ICD-10-CM | POA: Diagnosis not present

## 2018-09-08 DIAGNOSIS — I69322 Dysarthria following cerebral infarction: Secondary | ICD-10-CM | POA: Diagnosis not present

## 2018-09-08 DIAGNOSIS — R41841 Cognitive communication deficit: Secondary | ICD-10-CM | POA: Diagnosis not present

## 2018-09-08 DIAGNOSIS — R262 Difficulty in walking, not elsewhere classified: Secondary | ICD-10-CM | POA: Diagnosis not present

## 2018-09-08 DIAGNOSIS — E876 Hypokalemia: Secondary | ICD-10-CM | POA: Diagnosis not present

## 2018-09-08 DIAGNOSIS — R52 Pain, unspecified: Secondary | ICD-10-CM | POA: Diagnosis not present

## 2018-09-08 DIAGNOSIS — G309 Alzheimer's disease, unspecified: Secondary | ICD-10-CM | POA: Diagnosis not present

## 2018-09-08 DIAGNOSIS — R5381 Other malaise: Secondary | ICD-10-CM | POA: Diagnosis not present

## 2018-09-08 DIAGNOSIS — Z931 Gastrostomy status: Secondary | ICD-10-CM | POA: Diagnosis not present

## 2018-09-08 DIAGNOSIS — A419 Sepsis, unspecified organism: Secondary | ICD-10-CM | POA: Diagnosis not present

## 2018-09-08 DIAGNOSIS — E1122 Type 2 diabetes mellitus with diabetic chronic kidney disease: Secondary | ICD-10-CM | POA: Diagnosis not present

## 2018-09-08 DIAGNOSIS — J9601 Acute respiratory failure with hypoxia: Secondary | ICD-10-CM | POA: Diagnosis not present

## 2018-09-08 DIAGNOSIS — I69991 Dysphagia following unspecified cerebrovascular disease: Secondary | ICD-10-CM | POA: Diagnosis not present

## 2018-09-08 DIAGNOSIS — J189 Pneumonia, unspecified organism: Secondary | ICD-10-CM | POA: Diagnosis not present

## 2018-09-08 DIAGNOSIS — F0391 Unspecified dementia with behavioral disturbance: Secondary | ICD-10-CM | POA: Diagnosis not present

## 2018-09-08 DIAGNOSIS — R1310 Dysphagia, unspecified: Secondary | ICD-10-CM | POA: Diagnosis not present

## 2018-09-08 DIAGNOSIS — A4101 Sepsis due to Methicillin susceptible Staphylococcus aureus: Secondary | ICD-10-CM | POA: Diagnosis not present

## 2018-09-08 DIAGNOSIS — R404 Transient alteration of awareness: Secondary | ICD-10-CM | POA: Diagnosis not present

## 2018-09-08 DIAGNOSIS — Z820 Family history of epilepsy and other diseases of the nervous system: Secondary | ICD-10-CM | POA: Diagnosis not present

## 2018-09-08 DIAGNOSIS — Z79899 Other long term (current) drug therapy: Secondary | ICD-10-CM | POA: Diagnosis not present

## 2018-09-08 DIAGNOSIS — I6782 Cerebral ischemia: Secondary | ICD-10-CM | POA: Diagnosis not present

## 2018-09-08 DIAGNOSIS — Z978 Presence of other specified devices: Secondary | ICD-10-CM | POA: Diagnosis not present

## 2018-09-08 DIAGNOSIS — K9423 Gastrostomy malfunction: Secondary | ICD-10-CM | POA: Diagnosis not present

## 2018-09-08 DIAGNOSIS — F0390 Unspecified dementia without behavioral disturbance: Secondary | ICD-10-CM | POA: Diagnosis not present

## 2018-09-08 DIAGNOSIS — I2510 Atherosclerotic heart disease of native coronary artery without angina pectoris: Secondary | ICD-10-CM | POA: Diagnosis not present

## 2018-09-08 DIAGNOSIS — G9340 Encephalopathy, unspecified: Secondary | ICD-10-CM | POA: Diagnosis not present

## 2018-09-08 DIAGNOSIS — Z7902 Long term (current) use of antithrombotics/antiplatelets: Secondary | ICD-10-CM | POA: Diagnosis not present

## 2018-09-08 DIAGNOSIS — M6281 Muscle weakness (generalized): Secondary | ICD-10-CM | POA: Diagnosis not present

## 2018-09-08 DIAGNOSIS — Z6821 Body mass index (BMI) 21.0-21.9, adult: Secondary | ICD-10-CM | POA: Diagnosis not present

## 2018-09-08 DIAGNOSIS — R278 Other lack of coordination: Secondary | ICD-10-CM | POA: Diagnosis not present

## 2018-09-08 DIAGNOSIS — Z7401 Bed confinement status: Secondary | ICD-10-CM | POA: Diagnosis not present

## 2018-09-08 DIAGNOSIS — J690 Pneumonitis due to inhalation of food and vomit: Secondary | ICD-10-CM | POA: Diagnosis not present

## 2018-09-08 DIAGNOSIS — Z794 Long term (current) use of insulin: Secondary | ICD-10-CM | POA: Diagnosis not present

## 2018-09-08 DIAGNOSIS — G9341 Metabolic encephalopathy: Secondary | ICD-10-CM | POA: Diagnosis not present

## 2018-09-08 DIAGNOSIS — E119 Type 2 diabetes mellitus without complications: Secondary | ICD-10-CM | POA: Diagnosis not present

## 2018-09-08 DIAGNOSIS — L89153 Pressure ulcer of sacral region, stage 3: Secondary | ICD-10-CM | POA: Diagnosis not present

## 2018-09-08 DIAGNOSIS — E569 Vitamin deficiency, unspecified: Secondary | ICD-10-CM | POA: Diagnosis not present

## 2018-09-08 DIAGNOSIS — K5909 Other constipation: Secondary | ICD-10-CM | POA: Diagnosis not present

## 2018-09-08 DIAGNOSIS — Z96622 Presence of left artificial elbow joint: Secondary | ICD-10-CM | POA: Diagnosis not present

## 2018-09-08 DIAGNOSIS — J181 Lobar pneumonia, unspecified organism: Secondary | ICD-10-CM | POA: Diagnosis not present

## 2018-09-08 DIAGNOSIS — Z823 Family history of stroke: Secondary | ICD-10-CM | POA: Diagnosis not present

## 2018-09-08 DIAGNOSIS — R4182 Altered mental status, unspecified: Secondary | ICD-10-CM | POA: Diagnosis not present

## 2018-09-08 DIAGNOSIS — I130 Hypertensive heart and chronic kidney disease with heart failure and stage 1 through stage 4 chronic kidney disease, or unspecified chronic kidney disease: Secondary | ICD-10-CM | POA: Diagnosis not present

## 2018-09-08 LAB — GLUCOSE, CAPILLARY
GLUCOSE-CAPILLARY: 191 mg/dL — AB (ref 70–99)
Glucose-Capillary: 128 mg/dL — ABNORMAL HIGH (ref 70–99)
Glucose-Capillary: 155 mg/dL — ABNORMAL HIGH (ref 70–99)
Glucose-Capillary: 173 mg/dL — ABNORMAL HIGH (ref 70–99)
Glucose-Capillary: 99 mg/dL (ref 70–99)

## 2018-09-08 NOTE — Discharge Summary (Signed)
[facility] NAME: Maurice Patterson    MR#:  [id number]  DATE OF BIRTH:  Apr 10, 1956  DATE OF ADMISSION:  07/22/2018   ADMITTING PHYSICIAN: Lance Coon, MD  DATE OF DISCHARGE: 09/08/2018  PRIMARY CARE PHYSICIAN: [name], MD   ADMISSION DIAGNOSIS:   Hypokalemia [E87.6] Community acquired pneumonia of right upper lobe of lung ([place]) [J18.1]  DISCHARGE DIAGNOSIS:   Principal Problem:   CAP (community acquired pneumonia) Active Problems:   Uncontrolled type 2 diabetes mellitus with hyperglycemia, with long-term current use of insulin ([place])   Essential hypertension   Alzheimer's dementia ([place])   GERD (gastroesophageal reflux disease)   Fever   Acute respiratory failure ([place])   Dysphagia   Aspiration pneumonia ([place])   Palliative care encounter   Pressure injury of skin   SECONDARY DIAGNOSIS:   Past Medical History:  Diagnosis Date  . Acute MI ([place])   . Arthritis   . Back pain   . CAD (coronary artery disease)   . CHF (congestive heart failure) ([place])   . Chronic kidney disease   . Degenerative lumbar disc   . Dementia ([place])   . Diabetes mellitus without complication ([place])   . GERD (gastroesophageal reflux disease)   . Hernia of abdominal cavity   . Hyperlipemia   . Hypertension   . Malignant intraductal papillary mucinous tumor of pancreas ([place])   . Memory loss   . Pancreatitis   . Seizures ([place])    staring spells  . Shingles   . Stroke ([place]) 08/2017  . TIA (transient ischemic attack)     HOSPITAL COURSE:  63 year old male with past medical history significant for CAD, CHF, CKD, dementia, diabetes, hypertension and seizure disorder presents to hospital secondary to worsening weakness and poor appetite.  1. Acute encephalopathy-fluctuating mentation- alert today -Concern for underlying dementia -Patient had MRI with and without contrast on admission that did not show anything acute, EEG for  generalized slowing. -Metabolic encephalopathy. Lumbar puncture also was done and negative for any infections. -Patient was treated empirically for possible herpes encephalitis on admission -Mentation is slightly improved that patient is alert and following some simple commands. Still not close to baseline according to wife.  Has periods of staring spells.  Tried antiepileptic medications, however due to family insistence it was discontinued as they were causing him to be more confused. -Appreciate neurology consult. Vimpat and Lamictal , Seroquel and Provigil-all discontinued now. - on asa and plavix-both are his home medications  2. Acute hypoxic respiratory failure-patient admitted to the hospital on 07/23/2018, intubated on 07/29/2018 initially, was reintubated again on 08/07/2018. extubated -Aspiration pneumonia was treated. Currently extubated. Finished IV antibiotics - Keep abdominal binder in place to minimize the chances of pulling the PEG tube out  3. Hypokalemia and hyponatremia. Improved after replacements  4. Dementia-continue Namenda, aricept. But independent and interactive at baseline according to wife -Has occasional staring spells but alert, following simple commands.  Speech is  incomprehensible, sometimes understandable  5. Diabetes mellitus-on Lantus and Novolog q6h  6. Nutrition- has a PEG tube as failed swallow study due to mentation and also aspiration pneumonias. Pulled PEG tube and had to be replaced by interventional Radiology. - Couldn't tolerate bolus feeds- so continue continuous feeds - continue abdominal binder in place to prevent pulling the feeding tube out. - once mentation improves- consider repeat swallow study as outpatient    Plan is to discharge to rehab today  DISCHARGE  CONDITIONS:   Guarded  CONSULTS OBTAINED:   Treatment Team:  Catarina Hartshorn, MD Leotis Pain, MD  DRUG ALLERGIES:   Allergies  Allergen Reactions    . Hydrocodone Anaphylaxis  . Morphine Other (See Comments)    Loss of memory  . Ambien [Zolpidem] Other (See Comments)    delirium    . Brilinta [Ticagrelor] Other (See Comments)    Stroke   . Flexeril [Cyclobenzaprine] Other (See Comments)    delerium   . Flunitrazepam Other (See Comments)    ROHYPNOL (hallucinations)  . Haldol [Haloperidol Lactate] Other (See Comments)    delerium   . Levetiracetam Diarrhea and Other (See Comments)    Unable to walk  . Lorazepam Hives  . Risperdal [Risperidone] Other (See Comments)    Delirium   . Trazodone Other (See Comments)    Delirium Can take in low doses   . Benadryl [Diphenhydramine Hcl (Sleep)] Rash  . Penicillins Rash    Mouth ulcers Has patient had a PCN reaction causing immediate rash, facial/tongue/throat swelling, SOB or lightheadedness with hypotension: Yes Has patient had a PCN reaction causing severe rash involving mucus membranes or skin necrosis: No Has patient had a PCN reaction that required hospitalization No Has patient had a PCN reaction occurring within the last 10 years: Yes If all of the above answers are "NO", then may proceed with Cephalosporin use.   DISCHARGE MEDICATIONS:   Allergies as of 09/08/2018      Reactions   Hydrocodone Anaphylaxis   Morphine Other (See Comments)   Loss of memory   Ambien [zolpidem] Other (See Comments)   delirium    Brilinta [ticagrelor] Other (See Comments)   Stroke   Flexeril [cyclobenzaprine] Other (See Comments)   delerium   Flunitrazepam Other (See Comments)   ROHYPNOL (hallucinations)   Haldol [haloperidol Lactate] Other (See Comments)   delerium   Levetiracetam Diarrhea, Other (See Comments)   Unable to walk   Lorazepam Hives   Risperdal [risperidone] Other (See Comments)   Delirium   Trazodone Other (See Comments)   Delirium Can take in low doses    Benadryl [diphenhydramine Hcl (sleep)] Rash   Penicillins Rash   Mouth ulcers Has patient had a PCN  reaction causing immediate rash, facial/tongue/throat swelling, SOB or lightheadedness with hypotension: Yes Has patient had a PCN reaction causing severe rash involving mucus membranes or skin necrosis: No Has patient had a PCN reaction that required hospitalization No Has patient had a PCN reaction occurring within the last 10 years: Yes If all of the above answers are "NO", then may proceed with Cephalosporin use.      Medication List    STOP taking these medications   aspirin EC 81 MG tablet Replaced by:  aspirin 81 MG chewable tablet   BOTOX 100 units Solr injection Generic drug:  botulinum toxin Type A   cholecalciferol 1000 units tablet Commonly known as:  VITAMIN D Replaced by:  cholecalciferol 10 MCG/ML Liqd   colestipol 1 g tablet Commonly known as:  COLESTID   diphenoxylate-atropine 2.5-0.025 MG tablet Commonly known as:  LOMOTIL   furosemide 40 MG tablet Commonly known as:  LASIX   insulin NPH Human 100 UNIT/ML injection Commonly known as:  HUMULIN N,NOVOLIN N   insulin regular 100 units/mL injection Commonly known as:  NOVOLIN R,HUMULIN R   isosorbide mononitrate 30 MG 24 hr tablet Commonly known as:  IMDUR   lamoTRIgine 100 MG tablet Commonly known as:  LAMICTAL   lidocaine  5 % Commonly known as:  LIDODERM   losartan 100 MG tablet Commonly known as:  COZAAR   metoprolol succinate 50 MG 24 hr tablet Commonly known as:  TOPROL-XL   nitroGLYCERIN 0.4 MG SL tablet Commonly known as:  NITROSTAT   pantoprazole 40 MG tablet Commonly known as:  PROTONIX Replaced by:  pantoprazole sodium 40 mg/20 mL Pack   potassium chloride 10 MEQ tablet Commonly known as:  K-DUR Replaced by:  potassium chloride 20 MEQ packet     TAKE these medications   acetaminophen 500 MG tablet Commonly known as:  TYLENOL Place 1 tablet (500 mg total) into feeding tube every 6 (six) hours as needed for mild pain. What changed:    how to take this  when to take  this  reasons to take this   albuterol (2.5 MG/3ML) 0.083% nebulizer solution Commonly known as:  PROVENTIL Take 3 mLs (2.5 mg total) by nebulization every 3 (three) hours as needed for wheezing or shortness of breath.   aspirin 81 MG chewable tablet Place 1 tablet (81 mg total) into feeding tube daily. Replaces:  aspirin EC 81 MG tablet   budesonide 0.25 MG/2ML nebulizer solution Commonly known as:  PULMICORT Take 2 mLs (0.25 mg total) by nebulization 2 (two) times daily.   chlorhexidine 0.12 % solution Commonly known as:  PERIDEX Use as directed 15 mLs in the mouth or throat 2 (two) times daily.   cholecalciferol 10 MCG/ML Liqd Commonly known as:  D-VI-SOL Place 2.5 mLs (1,000 Units total) into feeding tube daily. Replaces:  cholecalciferol 1000 units tablet   clopidogrel 75 MG tablet Commonly known as:  PLAVIX Place 1 tablet (75 mg total) into feeding tube daily. What changed:  how to take this   donepezil 10 MG tablet Commonly known as:  ARICEPT Place 1 tablet (10 mg total) into feeding tube 2 (two) times daily. What changed:  how to take this   feeding supplement (GLUCERNA 1.5 CAL) Liqd Place 1,000 mLs into feeding tube continuous.   free water Soln Place 180 mLs into feeding tube every 4 (four) hours.   guaiFENesin-dextromethorphan 100-10 MG/5ML syrup Commonly known as:  ROBITUSSIN DM Place 5 mLs into feeding tube every 4 (four) hours as needed for cough.   insulin aspart 100 UNIT/ML injection Commonly known as:  novoLOG Inject 3 Units into the skin every 6 (six) hours.   insulin glargine 100 UNIT/ML injection Commonly known as:  LANTUS Inject 0.12 mLs (12 Units total) into the skin daily.   ipratropium-albuterol 0.5-2.5 (3) MG/3ML Soln Commonly known as:  DUONEB Take 3 mLs by nebulization 3 (three) times daily.   memantine 10 MG tablet Commonly known as:  NAMENDA Place 1 tablet (10 mg total) into feeding tube 2 (two) times daily. What changed:  how  to take this   metoprolol tartrate 25 MG tablet Commonly known as:  LOPRESSOR Place 1 tablet (25 mg total) into feeding tube 2 (two) times daily.   pantoprazole sodium 40 mg/20 mL Pack Commonly known as:  PROTONIX Place 20 mLs (40 mg total) into feeding tube daily. Replaces:  pantoprazole 40 MG tablet   potassium chloride 20 MEQ packet Commonly known as:  KLOR-CON Place 20 mEq into feeding tube daily. Replaces:  potassium chloride 10 MEQ tablet   senna-docusate 8.6-50 MG tablet Commonly known as:  Senokot-S Place 1 tablet into feeding tube daily.        DISCHARGE INSTRUCTIONS:   1. PCP f/u in 1 week  2. Physical Therapy  DIET:   Continuous feeds via Tube Feeding- Glucerna 1.5 Cal, 55 ml/hr via PEG tube  ACTIVITY:   As tolerated  OXYGEN:   Home Oxygen: No Oxygen Delivery: Room air  DISCHARGE LOCATION:   [place]  If you experience worsening of your admission symptoms, develop shortness of breath, life threatening emergency, suicidal or homicidal thoughts you must seek medical attention immediately by calling 911 or calling your MD immediately  if symptoms less severe.  You Must read complete instructions/literature along with all the possible adverse reactions/side effects for all the Medicines you take and that have been prescribed to you. Take any new Medicines after you have completely understood and accpet all the possible adverse reactions/side effects.   Please note  You were cared for by a hospitalist during your hospital stay. If you have any questions about your discharge medications or the care you received while you were in the hospital after you are discharged, you can call the unit and asked to speak with the hospitalist on call if the hospitalist that took care of you is not available. Once you are discharged, your primary care physician will handle any further medical issues. Please note that NO REFILLS for any discharge  medications will be authorized once you are discharged, as it is imperative that you return to your primary care physician (or establish a relationship with a primary care physician if you do not have one) for your aftercare needs so that they can reassess your need for medications and monitor your lab values.    On the day of Discharge:  VITAL SIGNS:   Blood pressure 133/86, pulse 76, temperature (!) 97.5 F (36.4 C), temperature source Axillary, resp. rate 19, height 5\' 6"  (1.676 m), weight 60 kg, SpO2 100 %.  PHYSICAL EXAMINATION:    GENERAL:  63 y.o.-year-old patient lying in the bed with no acute distress. Appears lethargic- but interacts when you address him as "joe" EYES: Pupils equal, round, reactive to light and accommodation. No scleral icterus. Extraocular muscles intact.  HEENT: Head atraumatic, normocephalic. Oropharynx and nasopharynx clear.  NECK:  Supple, no jugular venous distention. No thyroid enlargement, no tenderness.  LUNGS: Normal breath sounds bilaterally, no wheezing, rales,rhonchi or crepitation. No use of accessory muscles of respiration. decreased at the bases CARDIOVASCULAR: S1, S2 normal. No murmurs, rubs, or gallops.  ABDOMEN: Soft, nontender, nondistended. Has a PEG tube in place. Bowel sounds present. No organomegaly or mass.  EXTREMITIES: No pedal edema, cyanosis, or clubbing. Hands in mittens NEUROLOGIC: Cranial nerves II through XII are intact. Appears sleepy., No facial droop - Muscle strength 5/5 in all extremities- does follow commands. Sensation intact. Gait not checked.  PSYCHIATRIC: The patient is drowsy-but easily aroused and following simple commands today.  Did tell me his date of birth SKIN: No obvious rash, lesion, or ulcer.   DATA REVIEW:   CBC Recent Labs  Lab 09/02/18 1416  WBC 6.7  HGB 13.6  HCT 42.6  PLT 254    Chemistries  Recent Labs  Lab 09/02/18 1416  NA 138  K 4.2  CL 105  CO2 25  GLUCOSE 169*  BUN 13  CREATININE  0.58*  CALCIUM 8.9     Microbiology Results  Results for orders placed or performed during the hospital encounter of 07/22/18  Urine culture     Status: None   Collection Time: 07/22/18 11:03 PM  Result Value Ref Range Status   Specimen Description  Final    URINE, RANDOM Performed at [facility], [address], [place], [place] [address]    Special Requests   Final    NONE Performed at [facility], [address] [place], [place] [address]    Culture   Final    NO GROWTH Performed at [facility], [place] [address] [place], [place] [address]    Report Status 07/24/2018 FINAL  Final  CULTURE, BLOOD (ROUTINE X 2) w Reflex to ID Panel     Status: None   Collection Time: 07/23/18 10:06 AM  Result Value Ref Range Status   Specimen Description BLOOD BLOOD LEFT ARM  Final   Special Requests   Final    BOTTLES DRAWN AEROBIC AND ANAEROBIC Blood Culture adequate volume   Culture   Final    NO GROWTH 5 DAYS Performed at [facility], [address] [place], [place] [address]    Report Status 07/28/2018 FINAL  Final  CULTURE, BLOOD (ROUTINE X 2) w Reflex to ID Panel     Status: None   Collection Time: 07/23/18 10:17 AM  Result Value Ref Range Status   Specimen Description BLOOD BLOOD LEFT WRIST  Final   Special Requests   Final    BOTTLES DRAWN AEROBIC AND ANAEROBIC Blood Culture adequate volume   Culture   Final    NO GROWTH 5 DAYS Performed at [facility], [address], [place], [place] [address]    Report Status 07/28/2018 FINAL  Final  Gram stain     Status: None   Collection Time: 07/26/18  3:11 PM  Result Value Ref Range Status   Specimen Description CSF  Final   Special Requests NONE  Final   Gram Stain   Final    NO ORGANISMS SEEN WBC SEEN Performed at [facility], [address], [place], [place] [address]    Report Status 07/26/2018 FINAL  Final  CSF culture     Status: None   Collection Time:  07/26/18  3:11 PM  Result Value Ref Range Status   Specimen Description   Final    CSF Performed at [facility], [address] [place], [place] [address]    Special Requests   Final    NONE Performed at [facility], [address] [place], [place] [address]    Gram Stain   Final    NO ORGANISMS SEEN WBC SEEN NO RBC SEEN Performed at [facility], [address] [place], [place] [address]    Culture   Final    NO GROWTH 3 DAYS Performed at [facility], [place] [address], [place], [place] [address]    Report Status 07/30/2018 FINAL  Final  Fungus Culture With Stain     Status: None   Collection Time: 07/26/18  3:11 PM  Result Value Ref Range Status   Fungus Stain Final report  Final   Fungus (Mycology) Culture Final report  Final    Comment: (NOTE) Performed At: [facility] [place], [place] [id number] Rush Farmer MD [id number]    Fungal Source CSF  Final    Comment: Performed at [facility], [address] [place],  [address]  Fungus Culture Result     Status: None   Collection Time: 07/26/18  3:11 PM  Result Value Ref Range Status   Result 1 Comment  Final    Comment: (NOTE) KOH/Calcofluor preparation:  no fungus  observed. Performed At: [facility] [place], [place] [id number] Rush Farmer MD [id number]   Fungal organism reflex     Status: None   Collection Time: 07/26/18  3:11 PM  Result Value Ref Range Status   Fungal result 1 Comment  Final    Comment: (NOTE) No yeast or mold isolated after 4 weeks. Performed At: [facility] [place], [place] [id number] Rush Farmer MD [id number]   CULTURE, BLOOD (ROUTINE X 2) w Reflex to ID Panel     Status: None   Collection Time: 07/29/18  6:14 AM  Result Value Ref Range Status   Specimen Description BLOOD LEFT FOREARM  Final   Special Requests   Final    BOTTLES DRAWN AEROBIC  AND ANAEROBIC Blood Culture adequate volume   Culture   Final    NO GROWTH 5 DAYS Performed at [facility], [address] [place], [place] [address]    Report Status 08/03/2018 FINAL  Final  CULTURE, BLOOD (ROUTINE X 2) w Reflex to ID Panel     Status: None   Collection Time: 07/29/18  6:17 AM  Result Value Ref Range Status   Specimen Description BLOOD LEFT HAND  Final   Special Requests   Final    BOTTLES DRAWN AEROBIC AND ANAEROBIC Blood Culture adequate volume   Culture   Final    NO GROWTH 5 DAYS Performed at [facility], [address], [place], [place] [address]    Report Status 08/03/2018 FINAL  Final  MRSA PCR Screening     Status: None   Collection Time: 07/29/18  4:18 PM  Result Value Ref Range Status   MRSA by PCR NEGATIVE NEGATIVE Final    Comment:        The GeneXpert MRSA Assay (FDA approved for NASAL specimens only), is one component of a comprehensive MRSA colonization surveillance program. It is not intended to diagnose MRSA infection nor to guide or monitor treatment for MRSA infections. Performed at [facility], [address] [place], [place] [address]   Culture, respiratory     Status: None   Collection Time: 08/01/18  3:27 PM  Result Value Ref Range Status   Specimen Description   Final    TRACHEAL ASPIRATE Performed at [facility], [address], [place], [place] [address]    Special Requests   Final    NONE Performed at [facility], [address] [place], [place] [address]    Gram Stain   Final    MODERATE WBC PRESENT,BOTH PMN AND MONONUCLEAR RARE GRAM POSITIVE COCCI Performed at [facility], [place] [address], [place], [place] [address]    Culture FEW CANDIDA TROPICALIS  Final   Report Status 08/04/2018 FINAL  Final  Culture, respiratory (non-expectorated)     Status: None   Collection Time: 08/03/18 11:12 AM  Result Value Ref Range Status   Specimen Description   Final     TRACHEAL ASPIRATE Performed at [facility], [address] [place], [place] [address]    Special Requests   Final    NONE Performed at [facility], [place], [place] [address]    Gram Stain   Final    NO WBC SEEN RARE SQUAMOUS EPITHELIAL CELLS PRESENT RARE GRAM POSITIVE COCCI    Culture   Final    FEW Consistent with normal respiratory flora. Performed at [facility], [place]  [address], [place], [place] [address]    Report Status 08/06/2018 FINAL  Final  Urine Culture     Status: None   Collection Time: 08/08/18  3:49 AM  Result Value Ref Range Status   Specimen Description   Final    URINE, RANDOM Performed at [facility], [address] [place], [place] [address]    Special Requests   Final    NONE Performed at [facility], [address], [place], [place] [address]    Culture   Final    NO GROWTH Performed at [facility], [place] [address], [place], [place] [address]    Report Status 08/09/2018 FINAL  Final  CULTURE, BLOOD (ROUTINE X 2) w Reflex to ID Panel     Status: None   Collection Time: 08/25/18 12:32 AM  Result Value Ref Range Status   Specimen Description BLOOD RIGHT FATTY CASTS  Final   Special Requests   Final    BOTTLES DRAWN AEROBIC AND ANAEROBIC Blood Culture adequate volume   Culture   Final    NO GROWTH 5 DAYS Performed at [facility], [address] [place], [place] [address]    Report Status 08/30/2018 FINAL  Final  CULTURE, BLOOD (ROUTINE X 2) w Reflex to ID Panel     Status: None   Collection Time: 08/25/18 12:32 AM  Result Value Ref Range Status   Specimen Description BLOOD RIGHT HAND  Final   Special Requests   Final    BOTTLES DRAWN AEROBIC AND ANAEROBIC Blood Culture adequate volume   Culture   Final    NO GROWTH 5 DAYS Performed at [facility], [address] [place], [place] [address]    Report Status 08/30/2018 FINAL  Final  Culture,  respiratory     Status: None   Collection Time: 08/25/18  6:13 AM  Result Value Ref Range Status   Specimen Description   Final    TRACHEAL ASPIRATE Performed at [facility], [address] [place], [place] [address]    Special Requests   Final    NONE Performed at [facility], [address] [place], [place] [address]    Gram Stain   Final    MODERATE WBC PRESENT, PREDOMINANTLY PMN RARE SQUAMOUS EPITHELIAL CELLS PRESENT FEW GRAM POSITIVE COCCI RARE YEAST    Culture   Final    MODERATE Consistent with normal respiratory flora. Performed at [facility], [place] [address] [place], [place] [address]    Report Status 08/27/2018 FINAL  Final    RADIOLOGY:  No results found.   Management plans discussed with the patient, family and they are in agreement.  CODE STATUS:     Code Status Orders  (From admission, onward)         Start     Ordered   07/23/18 0123  Full code  Continuous     07/23/18 0122        Code Status History    Date Active Date Inactive Code Status Order ID Comments User Context   04/05/2016 0304 04/05/2016 1851 Full Code [id number]  Harvie Bridge, DO Inpatient   10/19/2015 0103 10/20/2015 1102 Full Code [id number]  Harrie Foreman, MD Inpatient   08/21/2015 1912 08/23/2015 1657 Full Code [id number]  Gladstone Lighter, MD Inpatient   02/19/2015 1627 02/20/2015 2054 Full Code [id number]  Henreitta Leber, MD Inpatient      TOTAL TIME TAKING CARE OF THIS PATIENT: 44  minutes.    Gladstone Lighter M.D on 09/08/2018 at 2:21 PM  Between 7am to 6pm - Pager - [phone]  After 6pm go to [email or web address] - Proofreader  [facility]  Office  [phone]  CC: Primary care physician; [name], MD   Note: This dictation was prepared with Dragon dictation along with smaller phrase technology. Any transcriptional errors that result from this process are unintentional.

## 2018-09-08 NOTE — Progress Notes (Signed)
[facility] [place] NAME: Zaryan Yakubov    MR#:  [id number]  DATE OF BIRTH:  03-Jun-1956  SUBJECTIVE:  CHIEF COMPLAINT:  No chief complaint on file.  -Remains the same as yesterday.  Sister at bedside.  He is interacting sometimes, following some simple commands -Nurses were unable to get him seated in the chair yesterday due to generalized weakness.  REVIEW OF SYSTEMS:  Review of Systems  Unable to perform ROS: Mental status change    DRUG ALLERGIES:   Allergies  Allergen Reactions  . Hydrocodone Anaphylaxis  . Morphine Other (See Comments)    Loss of memory  . Ambien [Zolpidem] Other (See Comments)    delirium    . Brilinta [Ticagrelor] Other (See Comments)    Stroke   . Flexeril [Cyclobenzaprine] Other (See Comments)    delerium   . Flunitrazepam Other (See Comments)    ROHYPNOL (hallucinations)  . Haldol [Haloperidol Lactate] Other (See Comments)    delerium   . Levetiracetam Diarrhea and Other (See Comments)    Unable to walk  . Lorazepam Hives  . Risperdal [Risperidone] Other (See Comments)    Delirium   . Trazodone Other (See Comments)    Delirium Can take in low doses   . Benadryl [Diphenhydramine Hcl (Sleep)] Rash  . Penicillins Rash    Mouth ulcers Has patient had a PCN reaction causing immediate rash, facial/tongue/throat swelling, SOB or lightheadedness with hypotension: Yes Has patient had a PCN reaction causing severe rash involving mucus membranes or skin necrosis: No Has patient had a PCN reaction that required hospitalization No Has patient had a PCN reaction occurring within the last 10 years: Yes If all of the above answers are "NO", then may proceed with Cephalosporin use.    VITALS:  Blood pressure (!) 125/59, pulse 71, temperature 98.4 F (36.9 C), temperature source Oral, resp. rate 18, height 5\' 6"  (1.676 m), weight 60 kg, SpO2 100 %.  PHYSICAL EXAMINATION:  Physical Exam   GENERAL:   63 y.o.-year-old patient lying in the bed with no acute distress. Appears lethargic- but interacts when you address him as "joe" EYES: Pupils equal, round, reactive to light and accommodation. No scleral icterus. Extraocular muscles intact.  HEENT: Head atraumatic, normocephalic. Oropharynx and nasopharynx clear.  NECK:  Supple, no jugular venous distention. No thyroid enlargement, no tenderness.  LUNGS: Normal breath sounds bilaterally, no wheezing, rales,rhonchi or crepitation. No use of accessory muscles of respiration. decreased at the bases CARDIOVASCULAR: S1, S2 normal. No murmurs, rubs, or gallops.  ABDOMEN: Soft, nontender, nondistended. Has a PEG tube in place. Bowel sounds present. No organomegaly or mass.  EXTREMITIES: No pedal edema, cyanosis, or clubbing. Hands in mittens NEUROLOGIC: Cranial nerves II through XII are intact. Appears sleepy., No facial droop - Muscle strength 5/5 in all extremities- does follow commands. Sensation intact. Gait not checked.  PSYCHIATRIC: The patient is drowsy-but easily aroused and following simple commands today.  Did tell me his date of birth SKIN: No obvious rash, lesion, or ulcer.    LABORATORY PANEL:   CBC Recent Labs  Lab 09/02/18 1416  WBC 6.7  HGB 13.6  HCT 42.6  PLT 254   ------------------------------------------------------------------------------------------------------------------  Chemistries  Recent Labs  Lab 09/02/18 1416  NA 138  K 4.2  CL 105  CO2 25  GLUCOSE 169*  BUN 13  CREATININE 0.58*  CALCIUM 8.9   ------------------------------------------------------------------------------------------------------------------  Cardiac Enzymes No results for input(s): TROPONINI in  the last 168 hours. ------------------------------------------------------------------------------------------------------------------  RADIOLOGY:  No results found.  EKG:   Orders placed or performed during the hospital encounter of  07/22/18  . EKG 12-Lead  . EKG 12-Lead  . EKG 12-Lead  . EKG 12-Lead  . ED EKG  . ED EKG    ASSESSMENT AND PLAN:   63 year old male with past medical history significant for CAD, CHF, CKD, dementia, diabetes, hypertension and seizure disorder presents to hospital secondary to worsening weakness and poor appetite.  1.  Acute encephalopathy-fluctuating mentation- alert today -Concern for underlying dementia -Patient had MRI with and without contrast on admission that did not show anything acute, EEG for generalized slowing. -Metabolic encephalopathy.  Lumbar puncture also was done and negative for any infections. -Patient was treated empirically for possible herpes encephalitis on admission -Mentation is slightly improved that patient is alert and following some simple commands.  Still not close to baseline according to wife.  Has periods of staring spells.  Tried antiepileptic medications, however due to family insistence it was discontinued as they were causing him to be more confused. -Appreciate neurology consult.  Vimpat and Lamictal , Seroquel and Provigil-all discontinued now. - on asa and plavix-both are his home medications  2.  Acute hypoxic respiratory failure-patient admitted to the hospital on 07/23/2018, intubated on 07/29/2018 initially, was reintubated again on 08/07/2018. extubated -Aspiration pneumonia was treated.  Currently extubated.  Finished IV antibiotics  3.  Hypokalemia and hyponatremia.  Improved after replacements  4. Dementia-continue Namenda, aricept.  But independent and interactive at baseline according to wife -Has occasional staring spells but alert, following simple commands.  Speech is incomprehensible, sometimes understandable  5.  Diabetes mellitus-on Lantus and sliding scale insulin  6.   DVT prophylaxis-on Lovenox   Plan is to discharge to rehab.  Awaiting to hear from insurance about wife's appeal to discharge Discussed the case with  social worker today    All the records are reviewed and case discussed with Care Management/Social Workerr. Management plans discussed with the patient, family and they are in agreement.  CODE STATUS: Full code  TOTAL TIME TAKING CARE OF THIS PATIENT: 37 minutes.   POSSIBLE D/C IN 1-2 DAYS, DEPENDING ON CLINICAL CONDITION.   Gladstone Lighter M.D on 09/08/2018 at 9:49 AM  Between 7am to 6pm - Pager - [phone]  After 6pm go to [email or web address] - password [email or web address] [facility]  Office  [phone]  CC: Primary care physician; [name], MD

## 2018-09-08 NOTE — Clinical Social Work Note (Signed)
CSW worked with patient's wife today in coordinating his discharge to [facility]. Patient's wife decided that she did not wish to pursue the appeal any longer and informed CSW that she had called the medicare appeal number and left message that she wanted to stop the appeal.   Patient's wife took patient's sister over to [facility] so she could visit and see the facility. Patient's wife stated that the sister and brother were not wanting him moved. After taking patient's sister to [facility], patient's wife stated patient's sister was in approval. Patient's wife stated that she is very pleased with [facility] and that she knows they will take care of him.   Patient's wife was concerned about patient's insurance auth not being good through today and was concerned that if patient stayed in the hospital that [employer] would not approve him for SNF the second time. CSW contacted [employer] and patient's Josem Kaufmann is still good. Physician updated the discharge information and CSW sent to [facility]. CSW had Tina at [facility] ensure that they reviewed the discharge information to make sure they didn't have any questions and that they had all the information they needed. Otila Kluver stated that they had everything that they needed.   Patient's wife requested transport via EMS to be at 3:30 or after as she had to run some errands to get patient some clothes that fit. CSW arranged EMS transport to not occur before 3:30. EMS came to transport patient at 5:15. CSW provided the CSW contact number should she need anything. Patient's wife expressed appreciation for CSW assistance.

## 2018-09-08 NOTE — Care Management (Signed)
Follow up on Medicare Appeal- Case ID #  [id number].  This RNCM received call from patient's wife stating that she was in transition to [facility] to "check it out".  She states someone called her about "re-doing paperwork" and asks what that might include.  I explained that it could have been the CSW regarding bed hold at [facility] and possibly authorization for SNF expiring.  Advised her to reach out to Unit floor for CSW. Unit RNCM updated.

## 2018-09-08 NOTE — Progress Notes (Signed)
Pt discharged per MD order. IV removed Report called to [place] at [facility]. Pt transported via EMS to [facility].

## 2018-09-09 ENCOUNTER — Encounter: Payer: Self-pay | Admitting: *Deleted

## 2018-09-09 ENCOUNTER — Other Ambulatory Visit: Payer: Self-pay

## 2018-09-09 ENCOUNTER — Emergency Department: Payer: PPO

## 2018-09-09 ENCOUNTER — Emergency Department
Admission: EM | Admit: 2018-09-09 | Discharge: 2018-09-09 | Disposition: A | Discharge status: Home / Self Care | Payer: PPO | Attending: Emergency Medicine | Admitting: Emergency Medicine

## 2018-09-09 DIAGNOSIS — Z7902 Long term (current) use of antithrombotics/antiplatelets: Secondary | ICD-10-CM | POA: Diagnosis not present

## 2018-09-09 DIAGNOSIS — N189 Chronic kidney disease, unspecified: Secondary | ICD-10-CM | POA: Insufficient documentation

## 2018-09-09 DIAGNOSIS — Z7982 Long term (current) use of aspirin: Secondary | ICD-10-CM | POA: Diagnosis not present

## 2018-09-09 DIAGNOSIS — Z8673 Personal history of transient ischemic attack (TIA), and cerebral infarction without residual deficits: Secondary | ICD-10-CM | POA: Diagnosis not present

## 2018-09-09 DIAGNOSIS — T85528A Displacement of other gastrointestinal prosthetic devices, implants and grafts, initial encounter: Secondary | ICD-10-CM

## 2018-09-09 DIAGNOSIS — E1122 Type 2 diabetes mellitus with diabetic chronic kidney disease: Secondary | ICD-10-CM | POA: Diagnosis not present

## 2018-09-09 DIAGNOSIS — G309 Alzheimer's disease, unspecified: Secondary | ICD-10-CM | POA: Insufficient documentation

## 2018-09-09 DIAGNOSIS — Z79899 Other long term (current) drug therapy: Secondary | ICD-10-CM | POA: Diagnosis not present

## 2018-09-09 DIAGNOSIS — Z431 Encounter for attention to gastrostomy: Secondary | ICD-10-CM | POA: Diagnosis not present

## 2018-09-09 DIAGNOSIS — I130 Hypertensive heart and chronic kidney disease with heart failure and stage 1 through stage 4 chronic kidney disease, or unspecified chronic kidney disease: Secondary | ICD-10-CM | POA: Insufficient documentation

## 2018-09-09 DIAGNOSIS — K9423 Gastrostomy malfunction: Secondary | ICD-10-CM | POA: Insufficient documentation

## 2018-09-09 DIAGNOSIS — Z9861 Coronary angioplasty status: Secondary | ICD-10-CM | POA: Diagnosis not present

## 2018-09-09 DIAGNOSIS — F0280 Dementia in other diseases classified elsewhere without behavioral disturbance: Secondary | ICD-10-CM | POA: Diagnosis not present

## 2018-09-09 DIAGNOSIS — Z96622 Presence of left artificial elbow joint: Secondary | ICD-10-CM | POA: Insufficient documentation

## 2018-09-09 DIAGNOSIS — Z87891 Personal history of nicotine dependence: Secondary | ICD-10-CM | POA: Insufficient documentation

## 2018-09-09 DIAGNOSIS — I2510 Atherosclerotic heart disease of native coronary artery without angina pectoris: Secondary | ICD-10-CM | POA: Diagnosis not present

## 2018-09-09 DIAGNOSIS — Z794 Long term (current) use of insulin: Secondary | ICD-10-CM | POA: Diagnosis not present

## 2018-09-09 DIAGNOSIS — I509 Heart failure, unspecified: Secondary | ICD-10-CM | POA: Insufficient documentation

## 2018-09-09 MED ORDER — IOPAMIDOL (ISOVUE-300) INJECTION 61%: 50.0000 mL | Freq: Once | INTRAVENOUS | Status: DC | PRN

## 2018-09-09 NOTE — ED Notes (Signed)
Waiting for transport

## 2018-09-09 NOTE — Discharge Instructions (Addendum)
Please seek medical attention for any high fevers, chest pain, shortness of breath, change in behavior, persistent vomiting, bloody stool or any other new or concerning symptoms.  

## 2018-09-09 NOTE — ED Notes (Signed)
Oral care performed. Lubrication used on lips for patient's comfort. TV is on for soothing music. Patient is alert. Specimen bag placed around foley inserted to GT site by Dr. Archie Balboa.

## 2018-09-09 NOTE — ED Notes (Signed)
Patient grimaced as GT was inserted by Dr. Archie Balboa, but didn't exhibit negative vocalizations and recovered well. Abdominal binder that was in placed from [facility] pulled over GT site. Imaging called for x-ray.

## 2018-09-09 NOTE — ED Notes (Signed)
Patient left via [facility] transport.

## 2018-09-09 NOTE — ED Triage Notes (Signed)
Per EMS report, staff reported to EMT that patient pulled out Gastric feeding tube 30 minutes prior to arrival.

## 2018-09-09 NOTE — ED Notes (Signed)
Central supply called for 18FR balloon/buffer retention Gastric tube catheter.

## 2018-09-09 NOTE — ED Provider Notes (Addendum)
[facility] Emergency Department Provider Note   ____________________________________________   I have reviewed the triage vital signs and the nursing notes.   HISTORY  Chief Complaint feeding tube displaced   History limited by and level 5 caveat due to: Dementia   HPI Maurice Patterson is a 63 y.o. male who presents to the emergency department today from living facility because of concerns for feeding tube displacement.  Staff told the EMS that the patient pulled it out roughly 30 minutes prior to their arrival.  Patient himself cannot give any history.  Per chart review it appears that the G-tube was initially placed 13 days ago.  The patient had a prolonged stay in the hospital.  Does appear that he failed a swallow study while in the hospital.   Per medical record review patient has a history of feeding tube placed on 1/5  Past Medical History:  Diagnosis Date  . Acute MI ([place])   . Arthritis   . Back pain   . CAD (coronary artery disease)   . CHF (congestive heart failure) ([place])   . Chronic kidney disease   . Degenerative lumbar disc   . Dementia ([place])   . Diabetes mellitus without complication ([place])   . GERD (gastroesophageal reflux disease)   . Hernia of abdominal cavity   . Hyperlipemia   . Hypertension   . Malignant intraductal papillary mucinous tumor of pancreas ([place])   . Memory loss   . Pancreatitis   . Seizures ([place])    staring spells  . Shingles   . Stroke ([place]) 08/2017  . TIA (transient ischemic attack)     Patient Active Problem List   Diagnosis Date Noted  . Pressure injury of skin 08/12/2018  . Aspiration pneumonia ([place])   . Palliative care encounter   . Dysphagia   . Acute respiratory failure ([place])   . Fever   . CAP (community acquired pneumonia) 07/22/2018  . GERD (gastroesophageal reflux disease) 07/22/2018  . Hypersalivation 01/30/2018  . Drooling 01/30/2018  . Alzheimer's dementia ([place]) 05/01/2017  . Cognitive  decline 10/20/2016  . Chest pain, rule out acute myocardial infarction 04/05/2016  . Confusion 10/18/2015  . Constipation 08/23/2015  . Uncontrolled type 2 diabetes mellitus with hyperglycemia, with long-term current use of insulin ([place]) 08/23/2015  . Essential hypertension 08/23/2015  . Right lower lobe pneumonia ([place]) 08/23/2015  . Abdominal pain 08/21/2015  . Altered mental status 02/19/2015    Past Surgical History:  Procedure Laterality Date  . CARDIAC CATHETERIZATION    . CORONARY ANGIOPLASTY    . CYSTOSCOPY WITH STENT PLACEMENT Right 05/13/2017   Procedure: CYSTOSCOPY WITH STENT PLACEMENT;  Surgeon: Nickie Retort, MD;  Location: [facility] ORS;  Service: Urology;  Laterality: Right;  . ERCP N/A 09/12/2015   Procedure: ENDOSCOPIC RETROGRADE CHOLANGIOPANCREATOGRAPHY (ERCP);  Surgeon: Hulen Luster, MD;  Location: [facility] ENDOSCOPY;  Service: Gastroenterology;  Laterality: N/A;  . ERCP N/A 01/02/2016   Procedure: ENDOSCOPIC RETROGRADE CHOLANGIOPANCREATOGRAPHY (ERCP);  Surgeon: Hulen Luster, MD;  Location: [facility] ENDOSCOPY;  Service: Gastroenterology;  Laterality: N/A;  . EXTERNAL FIXATION WRIST FRACTURE Left   . left arm metal plate    . LEFT HEART CATH AND CORONARY ANGIOGRAPHY Left 11/04/2016   Procedure: Left Heart Cath and Coronary Angiography;  Surgeon: Isaias Cowman, MD;  Location: [facility] CV LAB;  Service: Cardiovascular;  Laterality: Left;  . Left shoulder surgery    . PEG PLACEMENT Left 08/28/2018   Procedure: PERCUTANEOUS ENDOSCOPIC GASTROSTOMY (  PEG) PLACEMENT;  Surgeon: Lin Landsman, MD;  Location: [facility] ENDOSCOPY;  Service: Gastroenterology;  Laterality: Left;  . Stent times 3  2013   Cardiac  . TOTAL ELBOW REPLACEMENT Left   . VASECTOMY    . VENTRICULOPERITONEAL SHUNT      Prior to Admission medications   Medication Sig Start Date End Date Taking? Authorizing Provider  acetaminophen (TYLENOL) 500 MG tablet Place 1 tablet (500 mg total) into feeding tube every 6  (six) hours as needed for mild pain. 09/06/18   Hillary Bow, MD  albuterol (PROVENTIL) (2.5 MG/3ML) 0.083% nebulizer solution Take 3 mLs (2.5 mg total) by nebulization every 3 (three) hours as needed for wheezing or shortness of breath. 09/06/18   Hillary Bow, MD  aspirin 81 MG chewable tablet Place 1 tablet (81 mg total) into feeding tube daily. 09/07/18   Sudini, Alveta Heimlich, MD  budesonide (PULMICORT) 0.25 MG/2ML nebulizer solution Take 2 mLs (0.25 mg total) by nebulization 2 (two) times daily. 09/06/18   Hillary Bow, MD  chlorhexidine (PERIDEX) 0.12 % solution Use as directed 15 mLs in the mouth or throat 2 (two) times daily. 09/06/18   Hillary Bow, MD  cholecalciferol (D-VI-SOL) 10 MCG/ML LIQD Place 2.5 mLs (1,000 Units total) into feeding tube daily. 09/07/18   Hillary Bow, MD  clopidogrel (PLAVIX) 75 MG tablet Place 1 tablet (75 mg total) into feeding tube daily. 09/07/18   Hillary Bow, MD  donepezil (ARICEPT) 10 MG tablet Place 1 tablet (10 mg total) into feeding tube 2 (two) times daily. 09/06/18   Hillary Bow, MD  guaiFENesin-dextromethorphan (ROBITUSSIN DM) 100-10 MG/5ML syrup Place 5 mLs into feeding tube every 4 (four) hours as needed for cough. 09/06/18   Hillary Bow, MD  insulin aspart (NOVOLOG) 100 UNIT/ML injection Inject 3 Units into the skin every 6 (six) hours. 09/06/18   Hillary Bow, MD  insulin glargine (LANTUS) 100 UNIT/ML injection Inject 0.12 mLs (12 Units total) into the skin daily. 09/07/18   Sudini, Alveta Heimlich, MD  ipratropium-albuterol (DUONEB) 0.5-2.5 (3) MG/3ML SOLN Take 3 mLs by nebulization 3 (three) times daily. 09/06/18   Hillary Bow, MD  memantine (NAMENDA) 10 MG tablet Place 1 tablet (10 mg total) into feeding tube 2 (two) times daily. 09/06/18   Hillary Bow, MD  metoprolol tartrate (LOPRESSOR) 25 MG tablet Place 1 tablet (25 mg total) into feeding tube 2 (two) times daily. 09/06/18   Hillary Bow, MD  Nutritional Supplements (FEEDING SUPPLEMENT,  GLUCERNA 1.5 CAL,) LIQD Place 1,000 mLs into feeding tube continuous. 09/06/18   Hillary Bow, MD  pantoprazole sodium (PROTONIX) 40 mg/20 mL PACK Place 20 mLs (40 mg total) into feeding tube daily. 09/07/18   Hillary Bow, MD  potassium chloride (KLOR-CON) 20 MEQ packet Place 20 mEq into feeding tube daily. 09/06/18   Hillary Bow, MD  senna-docusate (SENOKOT-S) 8.6-50 MG tablet Place 1 tablet into feeding tube daily. 09/07/18   Hillary Bow, MD  Water For Irrigation, Sterile (FREE WATER) SOLN Place 180 mLs into feeding tube every 4 (four) hours. 09/06/18   Hillary Bow, MD    Allergies Hydrocodone; Morphine; Ambien [zolpidem]; Brilinta [ticagrelor]; Flexeril [cyclobenzaprine]; Flunitrazepam; Haldol [haloperidol lactate]; Levetiracetam; Lorazepam; Risperdal [risperidone]; Trazodone; Benadryl [diphenhydramine hcl (sleep)]; and Penicillins  Family History  Problem Relation Age of Onset  . Diabetes Mother   . Hypertension Mother   . CAD Mother   . Hyperlipidemia Mother   . Stroke Mother   . ALS Mother   . Alzheimer's disease Father   .  Diabetes Father   . Heart Problems Brother     Social History Social History   Tobacco Use  . Smoking status: Former Smoker    Last attempt to quit: 2013    Years since quitting: 7.0  . Smokeless tobacco: Never Used  . Tobacco comment: used to smoke 2PD for 40 yrs, quit about 4 years ago  Substance Use Topics  . Alcohol use: No    Alcohol/week: 0.0 standard drinks    Comment: occasional  . Drug use: No    Review of Systems Unable to obtain secondary to dementia ____________________________________________   PHYSICAL EXAM:  VITAL SIGNS: ED Triage Vitals  Enc Vitals Group     BP 09/09/18 1249 (!) 153/96     Pulse Rate 09/09/18 1249 95     Resp 09/09/18 1249 18     Temp 09/09/18 1249 97.8 F (36.6 C)     Temp Source 09/09/18 1249 Oral     SpO2 09/09/18 1249 100 %     Weight 09/09/18 1250 132 lb 4.4 oz (60 kg)     Height 09/09/18  1250 5\' 6"  (1.676 m)   Constitutional: Awake and alert. Not oriented.  Eyes: Conjunctivae are normal.  ENT      Head: Normocephalic and atraumatic.      Nose: No congestion/rhinnorhea.      Mouth/Throat: Mucous membranes are moist.      Neck: No stridor. Hematological/Lymphatic/Immunilogical: No cervical lymphadenopathy. Cardiovascular: Normal rate, regular rhythm.  No murmurs, rubs, or gallops.  Respiratory: Normal respiratory effort without tachypnea nor retractions. Breath sounds are clear and equal bilaterally. No wheezes/rales/rhonchi. Gastrointestinal: Soft and non tender. Feeding tube in upper abdomen.  Genitourinary: Deferred Musculoskeletal:  No lower extremity edema. Neurologic:  Mumbling. Awake and alert. Skin:  Skin is warm, dry and intact. No rash noted.  ____________________________________________    LABS (pertinent positives/negatives)  None  ____________________________________________   EKG  None  ____________________________________________    RADIOLOGY  KUB Contrast in g tube and stomach. No evidence of a leak  ____________________________________________   PROCEDURES  Procedures  Gastrostomy tube replacement Performed by: Nance Pear Consent: Verbal consent obtained. Risks and benefits: risks, benefits and alternatives were discussed Required items: required blood products, implants, devices, and special equipment available  Comments: 18 french Gastrostomy tube placed without difficulty  ____________________________________________   INITIAL IMPRESSION / ASSESSMENT AND PLAN / ED COURSE  Pertinent labs & imaging results that were available during my care of the patient were reviewed by me and considered in my medical decision making (see chart for details).   Patient presented from living facility because of concerns for a dislodged G-tube.  This was replaced.  KUB shows contrast in the stomach without evidence of  leak.  ____________________________________________   FINAL CLINICAL IMPRESSION(S) / ED DIAGNOSES  Final diagnoses:  Dislodged gastrostomy tube [facility])     Note: This dictation was prepared with Dragon dictation. Any transcriptional errors that result from this process are unintentional     Nance Pear, MD 09/09/18 1454    Nance Pear, MD 09/09/18 [phone]

## 2018-09-12 ENCOUNTER — Other Ambulatory Visit: Payer: Self-pay | Admitting: *Deleted

## 2018-09-12 DIAGNOSIS — K219 Gastro-esophageal reflux disease without esophagitis: Secondary | ICD-10-CM | POA: Diagnosis not present

## 2018-09-12 DIAGNOSIS — I10 Essential (primary) hypertension: Secondary | ICD-10-CM | POA: Diagnosis not present

## 2018-09-12 DIAGNOSIS — I2510 Atherosclerotic heart disease of native coronary artery without angina pectoris: Secondary | ICD-10-CM | POA: Diagnosis not present

## 2018-09-12 DIAGNOSIS — E119 Type 2 diabetes mellitus without complications: Secondary | ICD-10-CM | POA: Diagnosis not present

## 2018-09-12 DIAGNOSIS — G9340 Encephalopathy, unspecified: Secondary | ICD-10-CM | POA: Diagnosis not present

## 2018-09-12 DIAGNOSIS — J9600 Acute respiratory failure, unspecified whether with hypoxia or hypercapnia: Secondary | ICD-10-CM | POA: Diagnosis not present

## 2018-09-12 DIAGNOSIS — F0390 Unspecified dementia without behavioral disturbance: Secondary | ICD-10-CM | POA: Diagnosis not present

## 2018-09-12 NOTE — Patient Outreach (Signed)
[facility]) Care Management  09/12/2018  MAKI SWEETSER 1955-10-13 [id number]   Attended interdisciplinary team meeting at  [facility]  to discuss patient's progress and plan for transitioning to home. PT stated patient had just arrived and was being evaluated ST, PT, and OT. SW stated a care-plan meeting was scheduled for later that afternoon and requested I sit in to assess for [facility] CM needs.   Met with patient's spouse Cecille Rubin along with facility SW, PT and Advancing to Home Rep.  Cecille Rubin gave a brief history of patient's hospital stay of 6 weeks for pneumonia and patient's past medical history of previous strokes.  Spouse stated patient was independent prior to hospitalization except for driving.  Spouse is hopeful patient will recover to his previous state of health, but knows he has been through a lot and has a long ways to go.  Spouse stated she was unsure of what would be needed once she got home equipment wise but feels they may need a ramp.  Made her aware [facility] LCSW could have some community resources to assist with this.  Explained [employer] Education officer, community and gave [place] with contact information.  Spouse agree to [facility] services and gave [phone] as the best contact number.  Went to patient's bedside to speak with patient.  Patient was resting but woke easily.  Introduced self and [facility] CM.  Patient noted to be using enteral nutrition, and spouse stated they have not cleared him for regular food yet.   Referral sent for [facility] LCSW to engage spouse for community resources.  Plan to see patient again next week and assess for more discharge needs.   Rutherford Limerick RN, BSN [place] Acute Care Coordinator [phone]) Business Mobile [phone]) Toll free office

## 2018-09-18 ENCOUNTER — Encounter: Payer: Self-pay | Admitting: *Deleted

## 2018-09-18 ENCOUNTER — Other Ambulatory Visit: Payer: Self-pay | Admitting: *Deleted

## 2018-09-18 NOTE — Patient Outreach (Addendum)
[facility]) Care Management  09/18/2018  TAJ NEVINS 08/22/1956 [id number]   Post Acute Care Coordination   Phone call to patient's spouse to assist with community resources for a wheelchair ramp for patient. Voicemail message left for a return call.    Sheralyn Boatman [facility] Care Management [phone]

## 2018-09-18 NOTE — Patient Outreach (Signed)
[facility]) Care Management  09/18/2018  Maurice Patterson 1956/02/15 [id number]   Return phone call from patient's spouse, who discussed the probole need for a wheelchair ramp for patient once he returns home. Per patient's spouse, patient is making slow progress but so far is doing well. Per patient's spouse, he is now able to stand. This Education officer, museum discussed making a referral to [employer]. Per patient's spouse, she welcomes the referral.   Plan: This Education officer, museum  will make the referral to [employer] for assistance with the wheelchair ramp.    Sheralyn Boatman [facility] Care Management [phone]

## 2018-09-19 ENCOUNTER — Other Ambulatory Visit: Payer: Self-pay | Admitting: *Deleted

## 2018-09-19 ENCOUNTER — Inpatient Hospital Stay: Payer: PPO

## 2018-09-19 ENCOUNTER — Emergency Department: Payer: PPO

## 2018-09-19 ENCOUNTER — Other Ambulatory Visit: Payer: Self-pay

## 2018-09-19 ENCOUNTER — Inpatient Hospital Stay
Admission: EM | Admit: 2018-09-19 | Discharge: 2018-09-29 | DRG: 871 | Disposition: A | Discharge status: Skilled Nursing Facility | Payer: PPO | Source: Skilled Nursing Facility | Attending: Internal Medicine | Admitting: Internal Medicine

## 2018-09-19 DIAGNOSIS — Z820 Family history of epilepsy and other diseases of the nervous system: Secondary | ICD-10-CM | POA: Diagnosis not present

## 2018-09-19 DIAGNOSIS — Z823 Family history of stroke: Secondary | ICD-10-CM

## 2018-09-19 DIAGNOSIS — F0391 Unspecified dementia with behavioral disturbance: Secondary | ICD-10-CM | POA: Diagnosis not present

## 2018-09-19 DIAGNOSIS — D649 Anemia, unspecified: Secondary | ICD-10-CM | POA: Diagnosis not present

## 2018-09-19 DIAGNOSIS — J449 Chronic obstructive pulmonary disease, unspecified: Secondary | ICD-10-CM | POA: Diagnosis not present

## 2018-09-19 DIAGNOSIS — E1165 Type 2 diabetes mellitus with hyperglycemia: Secondary | ICD-10-CM | POA: Diagnosis present

## 2018-09-19 DIAGNOSIS — J698 Pneumonitis due to inhalation of other solids and liquids: Secondary | ICD-10-CM | POA: Diagnosis not present

## 2018-09-19 DIAGNOSIS — I252 Old myocardial infarction: Secondary | ICD-10-CM | POA: Diagnosis not present

## 2018-09-19 DIAGNOSIS — F0390 Unspecified dementia without behavioral disturbance: Secondary | ICD-10-CM | POA: Diagnosis present

## 2018-09-19 DIAGNOSIS — A419 Sepsis, unspecified organism: Secondary | ICD-10-CM | POA: Diagnosis not present

## 2018-09-19 DIAGNOSIS — I2510 Atherosclerotic heart disease of native coronary artery without angina pectoris: Secondary | ICD-10-CM | POA: Diagnosis not present

## 2018-09-19 DIAGNOSIS — J690 Pneumonitis due to inhalation of food and vomit: Secondary | ICD-10-CM

## 2018-09-19 DIAGNOSIS — M2550 Pain in unspecified joint: Secondary | ICD-10-CM | POA: Diagnosis not present

## 2018-09-19 DIAGNOSIS — L89153 Pressure ulcer of sacral region, stage 3: Secondary | ICD-10-CM | POA: Diagnosis not present

## 2018-09-19 DIAGNOSIS — R4182 Altered mental status, unspecified: Secondary | ICD-10-CM | POA: Diagnosis not present

## 2018-09-19 DIAGNOSIS — E43 Unspecified severe protein-calorie malnutrition: Secondary | ICD-10-CM | POA: Diagnosis present

## 2018-09-19 DIAGNOSIS — Z885 Allergy status to narcotic agent status: Secondary | ICD-10-CM

## 2018-09-19 DIAGNOSIS — R1312 Dysphagia, oropharyngeal phase: Secondary | ICD-10-CM | POA: Diagnosis not present

## 2018-09-19 DIAGNOSIS — Z7982 Long term (current) use of aspirin: Secondary | ICD-10-CM

## 2018-09-19 DIAGNOSIS — Z6821 Body mass index (BMI) 21.0-21.9, adult: Secondary | ICD-10-CM

## 2018-09-19 DIAGNOSIS — J80 Acute respiratory distress syndrome: Secondary | ICD-10-CM | POA: Diagnosis not present

## 2018-09-19 DIAGNOSIS — J9600 Acute respiratory failure, unspecified whether with hypoxia or hypercapnia: Secondary | ICD-10-CM | POA: Diagnosis not present

## 2018-09-19 DIAGNOSIS — G309 Alzheimer's disease, unspecified: Secondary | ICD-10-CM | POA: Diagnosis not present

## 2018-09-19 DIAGNOSIS — J9601 Acute respiratory failure with hypoxia: Secondary | ICD-10-CM | POA: Diagnosis not present

## 2018-09-19 DIAGNOSIS — Z87891 Personal history of nicotine dependence: Secondary | ICD-10-CM

## 2018-09-19 DIAGNOSIS — R404 Transient alteration of awareness: Secondary | ICD-10-CM | POA: Diagnosis not present

## 2018-09-19 DIAGNOSIS — I10 Essential (primary) hypertension: Secondary | ICD-10-CM | POA: Diagnosis not present

## 2018-09-19 DIAGNOSIS — R1310 Dysphagia, unspecified: Secondary | ICD-10-CM | POA: Diagnosis present

## 2018-09-19 DIAGNOSIS — Z8249 Family history of ischemic heart disease and other diseases of the circulatory system: Secondary | ICD-10-CM

## 2018-09-19 DIAGNOSIS — T17908A Unspecified foreign body in respiratory tract, part unspecified causing other injury, initial encounter: Secondary | ICD-10-CM

## 2018-09-19 DIAGNOSIS — Z8673 Personal history of transient ischemic attack (TIA), and cerebral infarction without residual deficits: Secondary | ICD-10-CM

## 2018-09-19 DIAGNOSIS — J439 Emphysema, unspecified: Secondary | ICD-10-CM | POA: Diagnosis not present

## 2018-09-19 DIAGNOSIS — Z794 Long term (current) use of insulin: Secondary | ICD-10-CM

## 2018-09-19 DIAGNOSIS — I6782 Cerebral ischemia: Secondary | ICD-10-CM | POA: Diagnosis not present

## 2018-09-19 DIAGNOSIS — Z7902 Long term (current) use of antithrombotics/antiplatelets: Secondary | ICD-10-CM | POA: Diagnosis not present

## 2018-09-19 DIAGNOSIS — G9341 Metabolic encephalopathy: Secondary | ICD-10-CM | POA: Diagnosis present

## 2018-09-19 DIAGNOSIS — L89899 Pressure ulcer of other site, unspecified stage: Secondary | ICD-10-CM | POA: Diagnosis not present

## 2018-09-19 DIAGNOSIS — R0603 Acute respiratory distress: Secondary | ICD-10-CM

## 2018-09-19 DIAGNOSIS — K219 Gastro-esophageal reflux disease without esophagitis: Secondary | ICD-10-CM | POA: Diagnosis not present

## 2018-09-19 DIAGNOSIS — R0902 Hypoxemia: Secondary | ICD-10-CM | POA: Diagnosis not present

## 2018-09-19 DIAGNOSIS — R262 Difficulty in walking, not elsewhere classified: Secondary | ICD-10-CM | POA: Diagnosis not present

## 2018-09-19 DIAGNOSIS — R6520 Severe sepsis without septic shock: Secondary | ICD-10-CM | POA: Diagnosis not present

## 2018-09-19 DIAGNOSIS — E119 Type 2 diabetes mellitus without complications: Secondary | ICD-10-CM | POA: Diagnosis not present

## 2018-09-19 DIAGNOSIS — R000 Tachycardia, unspecified: Secondary | ICD-10-CM | POA: Diagnosis not present

## 2018-09-19 DIAGNOSIS — A4101 Sepsis due to Methicillin susceptible Staphylococcus aureus: Principal | ICD-10-CM | POA: Diagnosis present

## 2018-09-19 DIAGNOSIS — J9602 Acute respiratory failure with hypercapnia: Secondary | ICD-10-CM | POA: Diagnosis not present

## 2018-09-19 DIAGNOSIS — R066 Hiccough: Secondary | ICD-10-CM | POA: Diagnosis not present

## 2018-09-19 DIAGNOSIS — Z9911 Dependence on respirator [ventilator] status: Secondary | ICD-10-CM | POA: Diagnosis not present

## 2018-09-19 DIAGNOSIS — E876 Hypokalemia: Secondary | ICD-10-CM | POA: Diagnosis not present

## 2018-09-19 DIAGNOSIS — Z8711 Personal history of peptic ulcer disease: Secondary | ICD-10-CM

## 2018-09-19 DIAGNOSIS — Z880 Allergy status to penicillin: Secondary | ICD-10-CM

## 2018-09-19 DIAGNOSIS — J9690 Respiratory failure, unspecified, unspecified whether with hypoxia or hypercapnia: Secondary | ICD-10-CM | POA: Diagnosis not present

## 2018-09-19 DIAGNOSIS — Z955 Presence of coronary angioplasty implant and graft: Secondary | ICD-10-CM

## 2018-09-19 DIAGNOSIS — L89306 Pressure-induced deep tissue damage of unspecified buttock: Secondary | ICD-10-CM | POA: Diagnosis not present

## 2018-09-19 DIAGNOSIS — R4020 Unspecified coma: Secondary | ICD-10-CM | POA: Diagnosis not present

## 2018-09-19 DIAGNOSIS — Z833 Family history of diabetes mellitus: Secondary | ICD-10-CM

## 2018-09-19 DIAGNOSIS — I509 Heart failure, unspecified: Secondary | ICD-10-CM | POA: Diagnosis not present

## 2018-09-19 DIAGNOSIS — R498 Other voice and resonance disorders: Secondary | ICD-10-CM | POA: Diagnosis not present

## 2018-09-19 DIAGNOSIS — R918 Other nonspecific abnormal finding of lung field: Secondary | ICD-10-CM | POA: Diagnosis not present

## 2018-09-19 DIAGNOSIS — M6281 Muscle weakness (generalized): Secondary | ICD-10-CM | POA: Diagnosis not present

## 2018-09-19 DIAGNOSIS — K5909 Other constipation: Secondary | ICD-10-CM | POA: Diagnosis not present

## 2018-09-19 DIAGNOSIS — Z931 Gastrostomy status: Secondary | ICD-10-CM

## 2018-09-19 DIAGNOSIS — Z7401 Bed confinement status: Secondary | ICD-10-CM | POA: Diagnosis not present

## 2018-09-19 DIAGNOSIS — Z978 Presence of other specified devices: Secondary | ICD-10-CM | POA: Diagnosis not present

## 2018-09-19 DIAGNOSIS — Z8349 Family history of other endocrine, nutritional and metabolic diseases: Secondary | ICD-10-CM

## 2018-09-19 DIAGNOSIS — E11610 Type 2 diabetes mellitus with diabetic neuropathic arthropathy: Secondary | ICD-10-CM | POA: Diagnosis not present

## 2018-09-19 DIAGNOSIS — Z431 Encounter for attention to gastrostomy: Secondary | ICD-10-CM | POA: Diagnosis not present

## 2018-09-19 DIAGNOSIS — I69991 Dysphagia following unspecified cerebrovascular disease: Secondary | ICD-10-CM | POA: Diagnosis not present

## 2018-09-19 DIAGNOSIS — R41841 Cognitive communication deficit: Secondary | ICD-10-CM | POA: Diagnosis not present

## 2018-09-19 DIAGNOSIS — J189 Pneumonia, unspecified organism: Secondary | ICD-10-CM | POA: Diagnosis not present

## 2018-09-19 LAB — CBC WITH DIFFERENTIAL/PLATELET
Abs Immature Granulocytes: 0.04 10*3/uL (ref 0.00–0.07)
Basophils Absolute: 0 10*3/uL (ref 0.0–0.1)
Basophils Relative: 0 %
Eosinophils Absolute: 0.1 10*3/uL (ref 0.0–0.5)
Eosinophils Relative: 1 %
HCT: 42.7 % (ref 39.0–52.0)
Hemoglobin: 14.2 g/dL (ref 13.0–17.0)
Immature Granulocytes: 0 %
Lymphocytes Relative: 16 %
Lymphs Abs: 2 10*3/uL (ref 0.7–4.0)
MCH: 30.4 pg (ref 26.0–34.0)
MCHC: 33.3 g/dL (ref 30.0–36.0)
MCV: 91.4 fL (ref 80.0–100.0)
Monocytes Absolute: 1 10*3/uL (ref 0.1–1.0)
Monocytes Relative: 8 %
NEUTROS PCT: 75 %
Neutro Abs: 9.6 10*3/uL — ABNORMAL HIGH (ref 1.7–7.7)
Platelets: 215 10*3/uL (ref 150–400)
RBC: 4.67 MIL/uL (ref 4.22–5.81)
RDW: 16.1 % — ABNORMAL HIGH (ref 11.5–15.5)
WBC: 12.7 10*3/uL — AB (ref 4.0–10.5)
nRBC: 0 % (ref 0.0–0.2)

## 2018-09-19 LAB — COMPREHENSIVE METABOLIC PANEL
ALT: 43 U/L (ref 0–44)
ANION GAP: 9 (ref 5–15)
AST: 23 U/L (ref 15–41)
Albumin: 3.6 g/dL (ref 3.5–5.0)
Alkaline Phosphatase: 180 U/L — ABNORMAL HIGH (ref 38–126)
BUN: 14 mg/dL (ref 8–23)
CO2: 26 mmol/L (ref 22–32)
Calcium: 8.7 mg/dL — ABNORMAL LOW (ref 8.9–10.3)
Chloride: 100 mmol/L (ref 98–111)
Creatinine, Ser: 0.53 mg/dL — ABNORMAL LOW (ref 0.61–1.24)
GFR calc Af Amer: >60 mL/min (ref >60)
GFR calc non Af Amer: >60 mL/min (ref >60)
Glucose, Bld: 202 mg/dL — ABNORMAL HIGH (ref 70–99)
Potassium: 4.2 mmol/L (ref 3.5–5.1)
Sodium: 135 mmol/L (ref 135–145)
Total Bilirubin: 0.7 mg/dL (ref 0.3–1.2)
Total Protein: 6.8 g/dL (ref 6.5–8.1)

## 2018-09-19 LAB — BLOOD GAS, ARTERIAL
ACID-BASE DEFICIT: 1.2 mmol/L (ref 0.0–2.0)
Bicarbonate: 23.6 mmol/L (ref 20.0–28.0)
FIO2: 0.4
MECHVT: 450 mL
O2 Saturation: 95.8 %
PEEP/CPAP: 5 cmH2O
PH ART: 7.39 (ref 7.350–7.450)
Patient temperature: 37
RATE: 16 resp/min
pCO2 arterial: 39 mmHg (ref 32.0–48.0)
pO2, Arterial: 81 mmHg — ABNORMAL LOW (ref 83.0–108.0)

## 2018-09-19 LAB — GLUCOSE, CAPILLARY
Glucose-Capillary: 141 mg/dL — ABNORMAL HIGH (ref 70–99)
Glucose-Capillary: 170 mg/dL — ABNORMAL HIGH (ref 70–99)
Glucose-Capillary: 174 mg/dL — ABNORMAL HIGH (ref 70–99)

## 2018-09-19 LAB — URINALYSIS, ROUTINE W REFLEX MICROSCOPIC
BILIRUBIN URINE: NEGATIVE
Glucose, UA: NEGATIVE mg/dL
Hgb urine dipstick: NEGATIVE
Ketones, ur: NEGATIVE mg/dL
Leukocytes, UA: NEGATIVE
Nitrite: NEGATIVE
Protein, ur: NEGATIVE mg/dL
Specific Gravity, Urine: 1.024 (ref 1.005–1.030)
pH: 7 (ref 5.0–8.0)

## 2018-09-19 LAB — TROPONIN I: Troponin I: <0.03 ng/mL (ref <0.03)

## 2018-09-19 LAB — PROCALCITONIN: Procalcitonin: <0.1 ng/mL

## 2018-09-19 LAB — LACTIC ACID, PLASMA
Lactic Acid, Venous: 2.6 mmol/L (ref 0.5–1.9)
Lactic Acid, Venous: 1.6 mmol/L (ref 0.5–1.9)

## 2018-09-19 MED ORDER — ENOXAPARIN SODIUM 40 MG/0.4ML ~~LOC~~ SOLN
40.0000 mg | SUBCUTANEOUS | Status: DC
  Administered 2018-09-19 – 2018-09-20 (×2): 40 mg via SUBCUTANEOUS
  Filled 2018-09-19 (×2): qty 0.4

## 2018-09-19 MED ORDER — SODIUM CHLORIDE 0.9 % IV SOLN
Freq: Once | INTRAVENOUS | Status: AC
  Administered 2018-09-19: 20:00:00 via INTRAVENOUS

## 2018-09-19 MED ORDER — ROCURONIUM BROMIDE 50 MG/5ML IV SOLN
1.0000 mg/kg | Freq: Once | INTRAVENOUS | Status: AC
  Administered 2018-09-19: 60 mg via INTRAVENOUS

## 2018-09-19 MED ORDER — SODIUM CHLORIDE 0.9 % IV SOLN
2.0000 g | Freq: Three times a day (TID) | INTRAVENOUS | Status: DC
  Administered 2018-09-20: 2 g via INTRAVENOUS
  Filled 2018-09-19 (×3): qty 2

## 2018-09-19 MED ORDER — SODIUM CHLORIDE 0.9 % IV SOLN
Freq: Once | INTRAVENOUS | Status: AC
  Administered 2018-09-19: 21:00:00 via INTRAVENOUS

## 2018-09-19 MED ORDER — ACETAMINOPHEN 325 MG PO TABS
650.0000 mg | ORAL_TABLET | ORAL | Status: DC | PRN
  Filled 2018-09-19: qty 2

## 2018-09-19 MED ORDER — FAMOTIDINE IN NACL 20-0.9 MG/50ML-% IV SOLN
20.0000 mg | Freq: Two times a day (BID) | INTRAVENOUS | Status: DC
  Administered 2018-09-19 – 2018-09-21 (×4): 20 mg via INTRAVENOUS
  Filled 2018-09-19 (×4): qty 50

## 2018-09-19 MED ORDER — CHLORHEXIDINE GLUCONATE 0.12% ORAL RINSE (MEDLINE KIT)
15.0000 mL | Freq: Two times a day (BID) | OROMUCOSAL | Status: DC
  Administered 2018-09-20 – 2018-09-29 (×19): 15 mL via OROMUCOSAL

## 2018-09-19 MED ORDER — VANCOMYCIN HCL IN DEXTROSE 1-5 GM/200ML-% IV SOLN
1000.0000 mg | Freq: Once | INTRAVENOUS | Status: DC
  Filled 2018-09-19: qty 200

## 2018-09-19 MED ORDER — LACTATED RINGERS IV SOLN
INTRAVENOUS | Status: DC
  Administered 2018-09-19: 23:00:00 via INTRAVENOUS

## 2018-09-19 MED ORDER — VANCOMYCIN HCL IN DEXTROSE 750-5 MG/150ML-% IV SOLN
750.0000 mg | Freq: Two times a day (BID) | INTRAVENOUS | Status: DC
  Administered 2018-09-19: 750 mg via INTRAVENOUS
  Filled 2018-09-19 (×2): qty 150

## 2018-09-19 MED ORDER — ETOMIDATE 2 MG/ML IV SOLN
10.0000 mg | Freq: Once | INTRAVENOUS | Status: AC
  Administered 2018-09-19: 10 mg via INTRAVENOUS

## 2018-09-19 MED ORDER — SODIUM CHLORIDE 0.9 % IV SOLN
2.0000 g | Freq: Once | INTRAVENOUS | Status: AC
  Administered 2018-09-19: 2 g via INTRAVENOUS
  Filled 2018-09-19: qty 2

## 2018-09-19 MED ORDER — IPRATROPIUM-ALBUTEROL 0.5-2.5 (3) MG/3ML IN SOLN
3.0000 mL | RESPIRATORY_TRACT | Status: DC | PRN
  Administered 2018-09-21 (×2): 3 mL via RESPIRATORY_TRACT
  Filled 2018-09-19: qty 3

## 2018-09-19 MED ORDER — FENTANYL 2500MCG IN NS 250ML (10MCG/ML) PREMIX INFUSION
INTRAVENOUS | Status: AC
  Administered 2018-09-19: 50 ug/h via INTRAVENOUS
  Filled 2018-09-19: qty 250

## 2018-09-19 MED ORDER — FENTANYL 2500MCG IN NS 250ML (10MCG/ML) PREMIX INFUSION
0.0000 ug/h | INTRAVENOUS | Status: DC
  Administered 2018-09-19: 50 ug/h via INTRAVENOUS
  Administered 2018-09-19 – 2018-09-21 (×2): 25 ug/h via INTRAVENOUS
  Filled 2018-09-19: qty 250

## 2018-09-19 MED ORDER — PROPOFOL 1000 MG/100ML IV EMUL
5.0000 ug/kg/min | INTRAVENOUS | Status: DC
  Administered 2018-09-19 (×2): 10 ug/kg/min via INTRAVENOUS
  Administered 2018-09-20: 4 ug/kg/min via INTRAVENOUS
  Filled 2018-09-19: qty 100

## 2018-09-19 MED ORDER — CLINDAMYCIN PHOSPHATE 300 MG/50ML IV SOLN
300.0000 mg | Freq: Three times a day (TID) | INTRAVENOUS | Status: DC
  Administered 2018-09-20 – 2018-09-21 (×4): 300 mg via INTRAVENOUS
  Filled 2018-09-19 (×6): qty 50

## 2018-09-19 MED ORDER — CLINDAMYCIN PHOSPHATE 900 MG/50ML IV SOLN
900.0000 mg | Freq: Once | INTRAVENOUS | Status: AC
  Administered 2018-09-19: 900 mg via INTRAVENOUS
  Filled 2018-09-19: qty 50

## 2018-09-19 MED ORDER — PROPOFOL 1000 MG/100ML IV EMUL
INTRAVENOUS | Status: AC
  Administered 2018-09-19: 10 ug/kg/min via INTRAVENOUS
  Filled 2018-09-19: qty 100

## 2018-09-19 MED ORDER — ONDANSETRON HCL 4 MG/2ML IJ SOLN: 4.0000 mg | Freq: Four times a day (QID) | INTRAMUSCULAR | Status: DC | PRN

## 2018-09-19 MED ORDER — INSULIN ASPART 100 UNIT/ML ~~LOC~~ SOLN
0.0000 [IU] | SUBCUTANEOUS | Status: DC
  Administered 2018-09-20: 5 [IU] via SUBCUTANEOUS
  Administered 2018-09-20 (×2): 2 [IU] via SUBCUTANEOUS
  Administered 2018-09-20: 5 [IU] via SUBCUTANEOUS
  Administered 2018-09-21: 3 [IU] via SUBCUTANEOUS
  Administered 2018-09-21: 8 [IU] via SUBCUTANEOUS
  Filled 2018-09-19 (×6): qty 1

## 2018-09-19 MED ORDER — ORAL CARE MOUTH RINSE
15.0000 mL | OROMUCOSAL | Status: DC
  Administered 2018-09-20 – 2018-09-22 (×27): 15 mL via OROMUCOSAL

## 2018-09-19 NOTE — Consult Note (Signed)
PULMONARY / CRITICAL CARE MEDICINE   Name: Maurice Patterson MRN: [id number] DOB: March 24, 1956    ADMISSION DATE:  09/19/2018 CONSULTATION DATE:  09/19/2018  REFERRING MD:  Dr. Jannifer Franklin  CHIEF COMPLAINT:  Altered Mental Status, Acute Respiratory Distress  BRIEF DISCUSSION: 63 y.o. Male admitted with Acute Hypoxic Respiratory Failure in setting of Aspiration, Sepsis, and Altered Mental Status.  He required intubation in the ED.    HISTORY OF PRESENT ILLNESS:   Maurice Patterson is a 63 y.o. Male with a PMH as listed below who presents to [facility] ED on 09/19/18 with c/o Altered Mental Status and Acute Respiratory Distress.  Pt is currently intubated and sedated, therefore history is obtained from pt's wife, ED and nursing notes.  His wife reported that she found him non-communicative and  lying flat at his rehab facility with green vomitus in his mouth.  Up till that time, she reports that the pt did not have any shortness of breath, cough, sputum production, fever, or chills, and was communicating with family and staff.  Upon arrival to the ED he was noted to have low grade fever 100.8, tachycardia, and tachypnea.  Initial workup in the ED revealed WBC 12.7, Lactic acid 2.6, negative troponin, glucose 202.  Urinalysis is negative for UTI.  CXR reveals bibasilar airspace disease (which is slightly improved from 08/30/18), but no new opacities identified.  He required intubation in the ED.  He is being admitted to [facility] ICU for treatment of Acute Hypoxic Respiratory Failure in setting of Aspiration Pneumonia requiring intubation, Sepsis, and Altered Mental Status.  PCCM is consulted for further management.  PAST MEDICAL HISTORY :  He  has a past medical history of Acute MI ([place]), Arthritis, Back pain, CAD (coronary artery disease), CHF (congestive heart failure) ([place]), Chronic kidney disease, Degenerative lumbar disc, Dementia ([place]), Diabetes mellitus without complication ([place]), GERD (gastroesophageal reflux disease),  Hernia of abdominal cavity, Hyperlipemia, Hypertension, Malignant intraductal papillary mucinous tumor of pancreas ([place]), Memory loss, Pancreatitis, Seizures ([place]), Shingles, Stroke ([place]) (08/2017), and TIA (transient ischemic attack).  PAST SURGICAL HISTORY: He  has a past surgical history that includes left arm metal plate; Left shoulder surgery; ERCP (N/A, 09/12/2015); ERCP (N/A, 01/02/2016); Cardiac catheterization; Coronary angioplasty; Total elbow replacement (Left); External fixation wrist fracture (Left); Vasectomy; Ventriculoperitoneal shunt; LEFT HEART CATH AND CORONARY ANGIOGRAPHY (Left, 11/04/2016); Stent times 3 (2013); Cystoscopy with stent placement (Right, 05/13/2017); and PEG placement (Left, 08/28/2018).  Allergies  Allergen Reactions  . Hydrocodone Anaphylaxis  . Morphine Other (See Comments)    Loss of memory  . Ambien [Zolpidem] Other (See Comments)    delirium    . Brilinta [Ticagrelor] Other (See Comments)    Stroke   . Flexeril [Cyclobenzaprine] Other (See Comments)    delerium   . Flunitrazepam Other (See Comments)    ROHYPNOL (hallucinations)  . Haldol [Haloperidol Lactate] Other (See Comments)    delerium   . Levetiracetam Diarrhea and Other (See Comments)    Unable to walk  . Lorazepam Hives  . Risperdal [Risperidone] Other (See Comments)    Delirium   . Trazodone Other (See Comments)    Delirium Can take in low doses   . Vancomycin Hives  . Benadryl [Diphenhydramine Hcl (Sleep)] Rash  . Penicillins Rash    Mouth ulcers Has patient had a PCN reaction causing immediate rash, facial/tongue/throat swelling, SOB or lightheadedness with hypotension: Yes Has patient had a PCN reaction causing severe rash involving mucus membranes or skin necrosis: No Has patient  had a PCN reaction that required hospitalization No Has patient had a PCN reaction occurring within the last 10 years: Yes If all of the above answers are "NO", then may proceed with Cephalosporin use.     No current facility-administered medications on file prior to encounter.    Current Outpatient Medications on File Prior to Encounter  Medication Sig  . acetaminophen (TYLENOL) 500 MG tablet Place 1 tablet (500 mg total) into feeding tube every 6 (six) hours as needed for mild pain.  Marland Kitchen albuterol (PROVENTIL) (2.5 MG/3ML) 0.083% nebulizer solution Take 3 mLs (2.5 mg total) by nebulization every 3 (three) hours as needed for wheezing or shortness of breath.  Marland Kitchen aspirin 81 MG chewable tablet Place 1 tablet (81 mg total) into feeding tube daily.  . cholecalciferol (D-VI-SOL) 10 MCG/ML LIQD Place 2.5 mLs (1,000 Units total) into feeding tube daily.  . clopidogrel (PLAVIX) 75 MG tablet Place 1 tablet (75 mg total) into feeding tube daily.  Marland Kitchen donepezil (ARICEPT) 10 MG tablet Place 1 tablet (10 mg total) into feeding tube 2 (two) times daily.  . insulin aspart (NOVOLOG) 100 UNIT/ML injection Inject 3 Units into the skin every 6 (six) hours.  . insulin glargine (LANTUS) 100 UNIT/ML injection Inject 0.12 mLs (12 Units total) into the skin daily.  . memantine (NAMENDA) 10 MG tablet Place 1 tablet (10 mg total) into feeding tube 2 (two) times daily.  . metoprolol tartrate (LOPRESSOR) 25 MG tablet Place 1 tablet (25 mg total) into feeding tube 2 (two) times daily.  . Nutritional Supplements (FEEDING SUPPLEMENT, GLUCERNA 1.5 CAL,) LIQD Place 1,000 mLs into feeding tube continuous.  . pantoprazole sodium (PROTONIX) 40 mg/20 mL PACK Place 20 mLs (40 mg total) into feeding tube daily.  . potassium chloride (KLOR-CON) 20 MEQ packet Place 20 mEq into feeding tube daily.  . Water For Irrigation, Sterile (FREE WATER) SOLN Place 180 mLs into feeding tube every 4 (four) hours.    FAMILY HISTORY:  His He indicated that his mother is deceased. He indicated that his father is deceased. He indicated that the status of his brother is unknown.   SOCIAL HISTORY: He  reports that he quit smoking about 7 years ago. He  has never used smokeless tobacco. He reports that he does not drink alcohol or use drugs.  REVIEW OF SYSTEMS:   Unable to obtain due to intubation and sedation  SUBJECTIVE:  Unable to obtain due to intubation and sedation  VITAL SIGNS: BP 117/80   Pulse (!) 109   Temp (!) 101.3 F (38.5 C)   Resp (!) 22   Ht '5\' 6"'$  (1.676 m)   Wt 60 kg   SpO2 96%   BMI 21.35 kg/m   HEMODYNAMICS:    VENTILATOR SETTINGS: Vent Mode: AC FiO2 (%):  [40 %] 40 % Set Rate:  [16 bmp] 16 bmp Vt Set:  [450 mL] 450 mL PEEP:  [5 cmH20] 5 cmH20  INTAKE / OUTPUT: No intake/output data recorded.  PHYSICAL EXAMINATION: General:  Acutely ill appearing male, laying in bed, intubated and sedated, in NAD Neuro:  Sedated, withdraws from pain, Pupils PERRL 2 mm sluggish bilaterally HEENT:  Atraumatic, normocephalic, neck supple, no JVD, ETT in place Cardiovascular:  Tachycardia, regular rhythm,s1s2, no M/R/G, 1+ pulses Lungs:  Clear to auscultation bilaterally, even, nonlabored, no assessory muscle use, vent assisted Abdomen:  Soft, nontender, nondistended, no guarding or rebound tenderness, BS+ x4, PEG tube in place Musculoskeletal:  No deformities, normal bulk and tone,  no edema Skin:  Warm/dry.  No obvious rashes, lesions, or ulcerations  LABS:  BMET Recent Labs  Lab 09/19/18 2003  NA 135  K 4.2  CL 100  CO2 26  BUN 14  CREATININE 0.53*  GLUCOSE 202*    Electrolytes Recent Labs  Lab 09/19/18 2003  CALCIUM 8.7*    CBC Recent Labs  Lab 09/19/18 2003  WBC 12.7*  HGB 14.2  HCT 42.7  PLT 215    Coag's No results for input(s): APTT, INR in the last 168 hours.  Sepsis Markers Recent Labs  Lab 09/19/18 2003  LATICACIDVEN 2.6*    ABG No results for input(s): PHART, PCO2ART, PO2ART in the last 168 hours.  Liver Enzymes Recent Labs  Lab 09/19/18 2003  AST 23  ALT 43  ALKPHOS 180*  BILITOT 0.7  ALBUMIN 3.6    Cardiac Enzymes Recent Labs  Lab 09/19/18 2003   TROPONINI <0.03    Glucose Recent Labs  Lab 09/19/18 1959  GLUCAP 174*    Imaging Dg Chest 1 View  Result Date: 09/19/2018 CLINICAL DATA:  Status post intubation. EXAM: CHEST  1 VIEW COMPARISON:  09/19/2018 at 1954 hours FINDINGS: New endotracheal tube tip projects 1.6 cm above the [place]. No change in the appearance of the lungs from the recent prior exam. No new lung abnormalities. IMPRESSION: Well-positioned endotracheal tube. No other change from the prior study. Electronically Signed   By: Lajean Manes M.D.   On: 09/19/2018 21:00   Dg Chest Port 1 View  Result Date: 09/19/2018 CLINICAL DATA:  Possible aspiration EXAM: PORTABLE CHEST 1 VIEW COMPARISON:  08/30/2018, 08/29/2018, 08/25/2018, 08/17/2018, 12/12/2017 FINDINGS: Mild bibasilar airspace disease, slightly improved since 08/30/2018. No new opacity or pleural effusion. Stable cardiomediastinal silhouette. No pneumothorax. Old right-sided rib fractures. IMPRESSION: Slightly improved airspace disease at both lung bases Electronically Signed   By: Donavan Foil M.D.   On: 09/19/2018 20:10     STUDIES:  CT Head wo Contrast 1/28>> No acute intracranial process.  Stable moderate parenchymal brain volume loss, advanced for age.  Old small RIGHT cerebellar infarct and mild chronic small vessel ischemic changes. KUB 1/28>> Gastrostomy tube tip projects over the left upper quadrant. No free air, organomegaly or evidence of bowel obstruction. Moderate stool in the colon.  CULTURES: Blood x2 09/19/18>> Urine 09/19/18>> Tracheal Aspirate 09/19/18>> Strep Pneumo urinary antigen 1/28>> Legionella urinary antigen 1/28>>  ANTIBIOTICS: Clindamycin 1/28>> Cefepime 1/28>> Vancomycin 1/28 x1 dose >>(discontinued due to concerns of Redman's syndrome)  SIGNIFICANT EVENTS: 09/19/18>> Intubated in ED. Admission to [facility] ICU  LINES/TUBES: ETT 1/28>> PEG Tube 08/28/18>>  ASSESSMENT / PLAN:  PULMONARY A: Acute Hypoxic Respiratory Failure in  setting of Aspiration Pneumonia  P:   Full vent support, PRVC: 8 cc/kg Wean FiO2 & PEEP as tolerated Follow intermittent CXR & ABG SBT when parameters met VAP Bundle Continue Cefepime and Clindamycin for now Follow cultures Prn Bronchodilators  CARDIOVASCULAR A:  No acute issues Hx: HTN, HLD, CHF, CAD, MI P:  Cardiac monitoring Maintain MAP >65 Received 2L NS bolus in ED 1/28 (30 cc/kg) LR @ 50 ml/hr Levophed if needed to maintain MAP goal Hold home Metoprolol  RENAL A:   Lactic Acidosis Hx: CKD P:   Monitor I&O's / urinary output Follow BMP Ensure adequate renal perfusion Avoid nephrotoxic agents as able Replace electrolytes as indicated Trend Lactic acid  GASTROINTESTINAL A:   ? Malpositioned PEG Tube Hx: Pancreatitis, GERD P:   NPO for now Pepcid for SUP  Obtain KUB Consider GI consult  HEMATOLOGIC A:   No acute issues P:  Monitor for S/Sx of bleeding Trend CBC Lovenox for VTE Prophylaxis  Transfuse for Hgb <7  INFECTIOUS A:   Sepsis secondary to Aspiration Pneumonia P:   Monitor fever curve Trend WBC's and Procalcitonin Follow cultures as above Continue Cefepime and Clindamycin.  Vancomycin discontinued due to concern for Red Man's syndrome in ED  ENDOCRINE A:   Diabetes Mellitus with Hyperglycemia   P:   CBG's SSI Follow ICU Hypo/hyperglycemia protocol  NEUROLOGIC A:   Acute Metabolic Encephalopathy in setting of Sepsis -CT Head negative 1/28 Sedation needs in setting of Mechanical Intubation Hx: Stroke, shingles, Seizures, Dementia P:   RASS goal: 0 to -1 Fentanyl and Propofol drips to maintain RASS goal Avoid sedating meds as able Daily WUA Provide supportive care   FAMILY  - Updates: Updated pt's wife at bedside 09/19/18.  She confirms pt is a Full Code.  - Inter-disciplinary family meet or Palliative Care meeting due by:  09/26/18    Darel Hong, AGACNP-BC [facility] Pager:  [phone] Cell: [phone]  09/19/2018, 9:49 PM

## 2018-09-19 NOTE — H&P (Signed)
[facility] [place] NAME: Maurice Patterson    MR#:  [id number]  DATE OF BIRTH:  09-06-1955  DATE OF ADMISSION:  09/19/2018  PRIMARY CARE PHYSICIAN: [name], MD   REQUESTING/REFERRING PHYSICIAN: Clearnce Hasten, MD  CHIEF COMPLAINT:   Chief Complaint  Patient presents with  . Respiratory Distress    HISTORY OF PRESENT ILLNESS:  Maurice Patterson  is a 63 y.o. male who presents with chief complaint as above.  Patient presents to the ED after an episode of aspiration pneumonia with subsequent respiratory failure.  He was recently admitted at this facility for an extended period of time due to community-acquired pneumonia initially, followed by multiple complications including recurrent episodes of aspiration.  He is been the rehab facility and per his wife's report he had been making good progress.  His daughter came in to see him tonight and found him laid flat with vomitus in and around his mouth.  He was brought to the ED for evaluation and was found to be hypoxic requiring intubation.  Initial work-up meet sepsis criteria.  Hospitalist were called for admission  PAST MEDICAL HISTORY:   Past Medical History:  Diagnosis Date  . Acute MI ([place])   . Arthritis   . Back pain   . CAD (coronary artery disease)   . CHF (congestive heart failure) ([place])   . Chronic kidney disease   . Degenerative lumbar disc   . Dementia ([place])   . Diabetes mellitus without complication ([place])   . GERD (gastroesophageal reflux disease)   . Hernia of abdominal cavity   . Hyperlipemia   . Hypertension   . Malignant intraductal papillary mucinous tumor of pancreas ([place])   . Memory loss   . Pancreatitis   . Seizures ([place])    staring spells  . Shingles   . Stroke ([place]) 08/2017  . TIA (transient ischemic attack)      PAST SURGICAL HISTORY:   Past Surgical History:  Procedure Laterality Date  . CARDIAC CATHETERIZATION    . CORONARY ANGIOPLASTY    .  CYSTOSCOPY WITH STENT PLACEMENT Right 05/13/2017   Procedure: CYSTOSCOPY WITH STENT PLACEMENT;  Surgeon: Nickie Retort, MD;  Location: [facility] ORS;  Service: Urology;  Laterality: Right;  . ERCP N/A 09/12/2015   Procedure: ENDOSCOPIC RETROGRADE CHOLANGIOPANCREATOGRAPHY (ERCP);  Surgeon: Hulen Luster, MD;  Location: [facility] ENDOSCOPY;  Service: Gastroenterology;  Laterality: N/A;  . ERCP N/A 01/02/2016   Procedure: ENDOSCOPIC RETROGRADE CHOLANGIOPANCREATOGRAPHY (ERCP);  Surgeon: Hulen Luster, MD;  Location: [facility] ENDOSCOPY;  Service: Gastroenterology;  Laterality: N/A;  . EXTERNAL FIXATION WRIST FRACTURE Left   . left arm metal plate    . LEFT HEART CATH AND CORONARY ANGIOGRAPHY Left 11/04/2016   Procedure: Left Heart Cath and Coronary Angiography;  Surgeon: Isaias Cowman, MD;  Location: [place];  Service: Cardiovascular;  Laterality: Left;  . Left shoulder surgery    . PEG PLACEMENT Left 08/28/2018   Procedure: PERCUTANEOUS ENDOSCOPIC GASTROSTOMY (PEG) PLACEMENT;  Surgeon: Lin Landsman, MD;  Location: [facility] ENDOSCOPY;  Service: Gastroenterology;  Laterality: Left;  . Stent times 3  2013   Cardiac  . TOTAL ELBOW REPLACEMENT Left   . VASECTOMY    . VENTRICULOPERITONEAL SHUNT       SOCIAL HISTORY:   Social History   Tobacco Use  . Smoking status: Former Smoker    Last attempt to quit: 2013    Years since quitting: 7.0  .  Smokeless tobacco: Never Used  . Tobacco comment: used to smoke 2PD for 40 yrs, quit about 4 years ago  Substance Use Topics  . Alcohol use: No    Alcohol/week: 0.0 standard drinks    Comment: occasional     FAMILY HISTORY:   Family History  Problem Relation Age of Onset  . Diabetes Mother   . Hypertension Mother   . CAD Mother   . Hyperlipidemia Mother   . Stroke Mother   . ALS Mother   . Alzheimer's disease Father   . Diabetes Father   . Heart Problems Brother      DRUG ALLERGIES:   Allergies  Allergen Reactions  . Hydrocodone  Anaphylaxis  . Morphine Other (See Comments)    Loss of memory  . Ambien [Zolpidem] Other (See Comments)    delirium    . Brilinta [Ticagrelor] Other (See Comments)    Stroke   . Flexeril [Cyclobenzaprine] Other (See Comments)    delerium   . Flunitrazepam Other (See Comments)    ROHYPNOL (hallucinations)  . Haldol [Haloperidol Lactate] Other (See Comments)    delerium   . Levetiracetam Diarrhea and Other (See Comments)    Unable to walk  . Lorazepam Hives  . Risperdal [Risperidone] Other (See Comments)    Delirium   . Trazodone Other (See Comments)    Delirium Can take in low doses   . Vancomycin Hives  . Benadryl [Diphenhydramine Hcl (Sleep)] Rash  . Penicillins Rash    Mouth ulcers Has patient had a PCN reaction causing immediate rash, facial/tongue/throat swelling, SOB or lightheadedness with hypotension: Yes Has patient had a PCN reaction causing severe rash involving mucus membranes or skin necrosis: No Has patient had a PCN reaction that required hospitalization No Has patient had a PCN reaction occurring within the last 10 years: Yes If all of the above answers are "NO", then may proceed with Cephalosporin use.    MEDICATIONS AT HOME:   Prior to Admission medications   Medication Sig Start Date End Date Taking? Authorizing Provider  acetaminophen (TYLENOL) 500 MG tablet Place 1 tablet (500 mg total) into feeding tube every 6 (six) hours as needed for mild pain. 09/06/18  Yes Sudini, Alveta Heimlich, MD  albuterol (PROVENTIL) (2.5 MG/3ML) 0.083% nebulizer solution Take 3 mLs (2.5 mg total) by nebulization every 3 (three) hours as needed for wheezing or shortness of breath. 09/06/18  Yes Hillary Bow, MD  aspirin 81 MG chewable tablet Place 1 tablet (81 mg total) into feeding tube daily. 09/07/18  Yes Sudini, Alveta Heimlich, MD  cholecalciferol (D-VI-SOL) 10 MCG/ML LIQD Place 2.5 mLs (1,000 Units total) into feeding tube daily. 09/07/18  Yes Sudini, Alveta Heimlich, MD  clopidogrel (PLAVIX) 75  MG tablet Place 1 tablet (75 mg total) into feeding tube daily. 09/07/18  Yes Sudini, Alveta Heimlich, MD  donepezil (ARICEPT) 10 MG tablet Place 1 tablet (10 mg total) into feeding tube 2 (two) times daily. 09/06/18  Yes Sudini, Alveta Heimlich, MD  insulin aspart (NOVOLOG) 100 UNIT/ML injection Inject 3 Units into the skin every 6 (six) hours. 09/06/18  Yes Sudini, Alveta Heimlich, MD  insulin glargine (LANTUS) 100 UNIT/ML injection Inject 0.12 mLs (12 Units total) into the skin daily. 09/07/18  Yes Sudini, Alveta Heimlich, MD  memantine (NAMENDA) 10 MG tablet Place 1 tablet (10 mg total) into feeding tube 2 (two) times daily. 09/06/18  Yes Hillary Bow, MD  metoprolol tartrate (LOPRESSOR) 25 MG tablet Place 1 tablet (25 mg total) into feeding tube 2 (two) times daily.  09/06/18  Yes Sudini, Alveta Heimlich, MD  Nutritional Supplements (FEEDING SUPPLEMENT, GLUCERNA 1.5 CAL,) LIQD Place 1,000 mLs into feeding tube continuous. 09/06/18  Yes Sudini, Alveta Heimlich, MD  pantoprazole sodium (PROTONIX) 40 mg/20 mL PACK Place 20 mLs (40 mg total) into feeding tube daily. 09/07/18  Yes Sudini, Alveta Heimlich, MD  potassium chloride (KLOR-CON) 20 MEQ packet Place 20 mEq into feeding tube daily. 09/06/18  Yes Hillary Bow, MD  Water For Irrigation, Sterile (FREE WATER) SOLN Place 180 mLs into feeding tube every 4 (four) hours. 09/06/18  Yes Hillary Bow, MD    REVIEW OF SYSTEMS:  Review of Systems  Unable to perform ROS: Critical illness     VITAL SIGNS:   Vitals:   09/19/18 2055 09/19/18 2100 09/19/18 2105 09/19/18 2110  BP: 106/78 (!) 153/86 113/81 105/77  Pulse:  (!) 113 (!) 109 (!) 109  Resp: (!) 22 18 (!) 23 (!) 22  Temp: (!) 100.6 F (38.1 C) (!) 100.8 F (38.2 C) (!) 100.9 F (38.3 C) (!) 101 F (38.3 C)  SpO2:  97% 97% 96%  Weight:      Height:       Wt Readings from Last 3 Encounters:  09/19/18 60 kg  09/09/18 60 kg  09/08/18 60 kg    PHYSICAL EXAMINATION:  Physical Exam  Vitals reviewed. Constitutional: He appears well-developed and  well-nourished. No distress.  HENT:  Head: Normocephalic and atraumatic.  Mouth/Throat: Oropharynx is clear and moist.  Eyes: Pupils are equal, round, and reactive to light. Conjunctivae and EOM are normal. No scleral icterus.  Neck: Normal range of motion. Neck supple. No JVD present. No thyromegaly present.  Cardiovascular: Normal rate, regular rhythm and intact distal pulses. Exam reveals no gallop and no friction rub.  No murmur heard. Respiratory: He is in respiratory distress.  Intubated and mechanically ventilated.  Coarse breath sounds  GI: Soft. Bowel sounds are normal. He exhibits no distension. There is no abdominal tenderness.  Musculoskeletal: Normal range of motion.        General: No edema.     Comments: No arthritis, no gout  Lymphadenopathy:    He has no cervical adenopathy.  Neurological:  Unable to fully assess due to critical illness  Skin: Skin is warm and dry. No rash noted. No erythema.  Psychiatric:  Unable to fully assess due to critical illness    LABORATORY PANEL:   CBC Recent Labs  Lab 09/19/18 2003  WBC 12.7*  HGB 14.2  HCT 42.7  PLT 215   ------------------------------------------------------------------------------------------------------------------  Chemistries  Recent Labs  Lab 09/19/18 2003  NA 135  K 4.2  CL 100  CO2 26  GLUCOSE 202*  BUN 14  CREATININE 0.53*  CALCIUM 8.7*  AST 23  ALT 43  ALKPHOS 180*  BILITOT 0.7   ------------------------------------------------------------------------------------------------------------------  Cardiac Enzymes Recent Labs  Lab 09/19/18 2003  TROPONINI <0.03   ------------------------------------------------------------------------------------------------------------------  RADIOLOGY:  Dg Chest 1 View  Result Date: 09/19/2018 CLINICAL DATA:  Status post intubation. EXAM: CHEST  1 VIEW COMPARISON:  09/19/2018 at 1954 hours FINDINGS: New endotracheal tube tip projects 1.6 cm above  the [place]. No change in the appearance of the lungs from the recent prior exam. No new lung abnormalities. IMPRESSION: Well-positioned endotracheal tube. No other change from the prior study. Electronically Signed   By: Lajean Manes M.D.   On: 09/19/2018 21:00   Dg Chest Port 1 View  Result Date: 09/19/2018 CLINICAL DATA:  Possible aspiration EXAM: PORTABLE CHEST 1 VIEW  COMPARISON:  08/30/2018, 08/29/2018, 08/25/2018, 08/17/2018, 12/12/2017 FINDINGS: Mild bibasilar airspace disease, slightly improved since 08/30/2018. No new opacity or pleural effusion. Stable cardiomediastinal silhouette. No pneumothorax. Old right-sided rib fractures. IMPRESSION: Slightly improved airspace disease at both lung bases Electronically Signed   By: Donavan Foil M.D.   On: 09/19/2018 20:10    EKG:   Orders placed or performed during the hospital encounter of 09/19/18  . EKG 12-Lead  . EKG 12-Lead  . ED EKG 12-Lead  . ED EKG 12-Lead    IMPRESSION AND PLAN:  Principal Problem:   Sepsis ([place]) -due to aspiration pneumonia as below.  IV antibiotics given, lactic acid was mildly elevated, IV fluids started and we will trend lactic acid until within normal limits.  Blood pressure is stable.  Cultures sent Active Problems:   Acute respiratory failure with hypoxia ([facility]) -due to his aspiration event, requiring intubation, currently mechanically ventilated.  Admit to ICU   Aspiration pneumonia ([place]) -IV antibiotics as above, other supportive treatment as above   Essential hypertension -continue home medications   Diabetes ([place]) -sliding scale insulin coverage   GERD (gastroesophageal reflux disease) -continue home meds  Chart review performed and case discussed with ED provider. Labs, imaging and/or ECG reviewed by provider and discussed with patient/family. Management plans discussed with the patient and/or family.  DVT PROPHYLAXIS: SubQ lovenox   GI PROPHYLAXIS:  PPI   ADMISSION STATUS: Inpatient     CODE  STATUS: Full Code Status History    Date Active Date Inactive Code Status Order ID Comments User Context   07/23/2018 0122 09/08/2018 2029 Full Code [id number]  Lance Coon, MD Inpatient   04/05/2016 0304 04/05/2016 1851 Full Code [id number]  Harvie Bridge, DO Inpatient   10/19/2015 0103 10/20/2015 1102 Full Code [id number]  Harrie Foreman, MD Inpatient   08/21/2015 1912 08/23/2015 1657 Full Code [id number]  Gladstone Lighter, MD Inpatient   02/19/2015 1627 02/20/2015 2054 Full Code [id number]  Henreitta Leber, MD Inpatient      TOTAL CRITICAL CARE TIME TAKING CARE OF THIS PATIENT: 50 minutes.   Ethlyn Daniels 09/19/2018, 9:24 PM  [facility]  Office  [phone]  CC: Primary care physician; [name], MD  Note:  This document was prepared using Dragon voice recognition software and may include unintentional dictation errors.

## 2018-09-19 NOTE — ED Notes (Signed)
Triaged by Osvaldo Angst, RN not Laury Axon NT

## 2018-09-19 NOTE — Progress Notes (Signed)
eLink Physician-Brief Progress Note Patient Name: Maurice Patterson DOB: 10-06-1955 MRN: [id number]   Date of Service  09/19/2018  HPI/Events of Note  Pt with multiple co-morbidites recently discharged from the hospital admitted through the ED with acute respiratory failures, altered mental status and sepsis re;ated to aspiration pneumonia. He is mechanically ventilated and on broad spectrum antibiotics.  eICU Interventions  Sepsis protocol, pan cultures, wean ventilator as tolerated, broad spectrum antibiotic coverage.        Kerry Kass Javel Hersh 09/19/2018, 11:56 PM

## 2018-09-19 NOTE — Telephone Encounter (Signed)
This encounter was created in error - please disregard.

## 2018-09-19 NOTE — Consult Note (Signed)
CODE SEPSIS - PHARMACY COMMUNICATION  **Broad Spectrum Antibiotics should be administered within 1 hour of Sepsis diagnosis**  Time Code Sepsis Called/Page Received: 1954  Antibiotics Ordered: Vancomycin/Cefepime  Time of 1st antibiotic administration: 2027  Additional action taken by pharmacy: Martin Majestic to room @ 2005 and saw nurse starting to hang dose, but did not get documented right away  If necessary, Name of Provider/Nurse Contacted:     Lu Duffel, PharmD, BCPS Clinical Pharmacist 09/19/2018 7:57 PM

## 2018-09-19 NOTE — ED Notes (Signed)
Family at bedside. Advised wife that vanc caused a small rash to the IV site which has since cleared. Vanc has been added to the allergy list.

## 2018-09-19 NOTE — ED Notes (Signed)
vanc stopped, MD aware, rash at insertion site

## 2018-09-19 NOTE — ED Notes (Signed)
Sherrie Therapist, sports, aware of bed assigned

## 2018-09-19 NOTE — ED Triage Notes (Signed)
Arrived via EMS from [facility] with respiratory concerns. Wife at bedside. Pt reportedly had green phlegm and also was coughing and tube feeding was running. Wife states he was here for 2 weeks on ventilator her in ICU.

## 2018-09-19 NOTE — ED Provider Notes (Signed)
[facility] Emergency Department Provider Note  ____________________________________________   First MD Initiated Contact with Patient 09/19/18 1935     (approximate)  I have reviewed the triage vital signs and the nursing notes.   HISTORY  Chief Complaint Respiratory Distress   HPI Maurice Patterson is a 63 y.o. male with a history of CAD, CHF, CKD, dementia as well as recent hospitalization for aspiration status post intubation was presenting with altered mental status and possible aspiration.  He is accompanied by his wife who states that she found him lying flat at his rehab with green vomitus in his mouth.  She said that earlier today he had been having normal conversations but since finding him after aspirating he is noncommunicative.  Minimally following commands.  Transferred to the emergency department for further evaluation and treatment.   Past Medical History:  Diagnosis Date  . Acute MI ([place])   . Arthritis   . Back pain   . CAD (coronary artery disease)   . CHF (congestive heart failure) ([place])   . Chronic kidney disease   . Degenerative lumbar disc   . Dementia ([place])   . Diabetes mellitus without complication ([place])   . GERD (gastroesophageal reflux disease)   . Hernia of abdominal cavity   . Hyperlipemia   . Hypertension   . Malignant intraductal papillary mucinous tumor of pancreas ([place])   . Memory loss   . Pancreatitis   . Seizures ([place])    staring spells  . Shingles   . Stroke ([place]) 08/2017  . TIA (transient ischemic attack)     Patient Active Problem List   Diagnosis Date Noted  . Diabetes ([place]) 09/19/2018  . Sepsis ([place]) 09/19/2018  . Pressure injury of skin 08/12/2018  . Aspiration pneumonia ([place])   . Palliative care encounter   . Dysphagia   . Acute respiratory failure with hypoxia ([place])   . Fever   . CAP (community acquired pneumonia) 07/22/2018  . GERD (gastroesophageal reflux disease) 07/22/2018  .  Hypersalivation 01/30/2018  . Drooling 01/30/2018  . Alzheimer's dementia ([place]) 05/01/2017  . Cognitive decline 10/20/2016  . Chest pain, rule out acute myocardial infarction 04/05/2016  . Confusion 10/18/2015  . Constipation 08/23/2015  . Uncontrolled type 2 diabetes mellitus with hyperglycemia, with long-term current use of insulin ([place]) 08/23/2015  . Essential hypertension 08/23/2015  . Right lower lobe pneumonia ([place]) 08/23/2015  . Abdominal pain 08/21/2015  . Altered mental status 02/19/2015    Past Surgical History:  Procedure Laterality Date  . CARDIAC CATHETERIZATION    . CORONARY ANGIOPLASTY    . CYSTOSCOPY WITH STENT PLACEMENT Right 05/13/2017   Procedure: CYSTOSCOPY WITH STENT PLACEMENT;  Surgeon: Nickie Retort, MD;  Location: [facility] ORS;  Service: Urology;  Laterality: Right;  . ERCP N/A 09/12/2015   Procedure: ENDOSCOPIC RETROGRADE CHOLANGIOPANCREATOGRAPHY (ERCP);  Surgeon: Hulen Luster, MD;  Location: [facility] ENDOSCOPY;  Service: Gastroenterology;  Laterality: N/A;  . ERCP N/A 01/02/2016   Procedure: ENDOSCOPIC RETROGRADE CHOLANGIOPANCREATOGRAPHY (ERCP);  Surgeon: Hulen Luster, MD;  Location: [facility] ENDOSCOPY;  Service: Gastroenterology;  Laterality: N/A;  . EXTERNAL FIXATION WRIST FRACTURE Left   . left arm metal plate    . LEFT HEART CATH AND CORONARY ANGIOGRAPHY Left 11/04/2016   Procedure: Left Heart Cath and Coronary Angiography;  Surgeon: Isaias Cowman, MD;  Location: [facility];  Service: Cardiovascular;  Laterality: Left;  . Left shoulder surgery    . PEG PLACEMENT Left 08/28/2018  Procedure: PERCUTANEOUS ENDOSCOPIC GASTROSTOMY (PEG) PLACEMENT;  Surgeon: Lin Landsman, MD;  Location: [facility] ENDOSCOPY;  Service: Gastroenterology;  Laterality: Left;  . Stent times 3  2013   Cardiac  . TOTAL ELBOW REPLACEMENT Left   . VASECTOMY    . VENTRICULOPERITONEAL SHUNT      Prior to Admission medications   Medication Sig Start Date End Date Taking? Authorizing  Provider  acetaminophen (TYLENOL) 500 MG tablet Place 1 tablet (500 mg total) into feeding tube every 6 (six) hours as needed for mild pain. 09/06/18   Hillary Bow, MD  albuterol (PROVENTIL) (2.5 MG/3ML) 0.083% nebulizer solution Take 3 mLs (2.5 mg total) by nebulization every 3 (three) hours as needed for wheezing or shortness of breath. 09/06/18   Hillary Bow, MD  aspirin 81 MG chewable tablet Place 1 tablet (81 mg total) into feeding tube daily. 09/07/18   Sudini, Alveta Heimlich, MD  budesonide (PULMICORT) 0.25 MG/2ML nebulizer solution Take 2 mLs (0.25 mg total) by nebulization 2 (two) times daily. 09/06/18   Hillary Bow, MD  chlorhexidine (PERIDEX) 0.12 % solution Use as directed 15 mLs in the mouth or throat 2 (two) times daily. 09/06/18   Hillary Bow, MD  cholecalciferol (D-VI-SOL) 10 MCG/ML LIQD Place 2.5 mLs (1,000 Units total) into feeding tube daily. 09/07/18   Hillary Bow, MD  clopidogrel (PLAVIX) 75 MG tablet Place 1 tablet (75 mg total) into feeding tube daily. 09/07/18   Hillary Bow, MD  donepezil (ARICEPT) 10 MG tablet Place 1 tablet (10 mg total) into feeding tube 2 (two) times daily. 09/06/18   Hillary Bow, MD  guaiFENesin-dextromethorphan (ROBITUSSIN DM) 100-10 MG/5ML syrup Place 5 mLs into feeding tube every 4 (four) hours as needed for cough. 09/06/18   Hillary Bow, MD  insulin aspart (NOVOLOG) 100 UNIT/ML injection Inject 3 Units into the skin every 6 (six) hours. 09/06/18   Hillary Bow, MD  insulin glargine (LANTUS) 100 UNIT/ML injection Inject 0.12 mLs (12 Units total) into the skin daily. 09/07/18   Sudini, Alveta Heimlich, MD  ipratropium-albuterol (DUONEB) 0.5-2.5 (3) MG/3ML SOLN Take 3 mLs by nebulization 3 (three) times daily. 09/06/18   Hillary Bow, MD  memantine (NAMENDA) 10 MG tablet Place 1 tablet (10 mg total) into feeding tube 2 (two) times daily. 09/06/18   Hillary Bow, MD  metoprolol tartrate (LOPRESSOR) 25 MG tablet Place 1 tablet (25 mg total) into feeding tube  2 (two) times daily. 09/06/18   Hillary Bow, MD  Nutritional Supplements (FEEDING SUPPLEMENT, GLUCERNA 1.5 CAL,) LIQD Place 1,000 mLs into feeding tube continuous. 09/06/18   Hillary Bow, MD  pantoprazole sodium (PROTONIX) 40 mg/20 mL PACK Place 20 mLs (40 mg total) into feeding tube daily. 09/07/18   Hillary Bow, MD  potassium chloride (KLOR-CON) 20 MEQ packet Place 20 mEq into feeding tube daily. 09/06/18   Hillary Bow, MD  senna-docusate (SENOKOT-S) 8.6-50 MG tablet Place 1 tablet into feeding tube daily. 09/07/18   Hillary Bow, MD  Water For Irrigation, Sterile (FREE WATER) SOLN Place 180 mLs into feeding tube every 4 (four) hours. 09/06/18   Hillary Bow, MD    Allergies Hydrocodone; Morphine; Ambien [zolpidem]; Brilinta [ticagrelor]; Flexeril [cyclobenzaprine]; Flunitrazepam; Haldol [haloperidol lactate]; Levetiracetam; Lorazepam; Risperdal [risperidone]; Trazodone; Benadryl [diphenhydramine hcl (sleep)]; and Penicillins  Family History  Problem Relation Age of Onset  . Diabetes Mother   . Hypertension Mother   . CAD Mother   . Hyperlipidemia Mother   . Stroke Mother   . ALS Mother   . Alzheimer's  disease Father   . Diabetes Father   . Heart Problems Brother     Social History Social History   Tobacco Use  . Smoking status: Former Smoker    Last attempt to quit: 2013    Years since quitting: 7.0  . Smokeless tobacco: Never Used  . Tobacco comment: used to smoke 2PD for 40 yrs, quit about 4 years ago  Substance Use Topics  . Alcohol use: No    Alcohol/week: 0.0 standard drinks    Comment: occasional  . Drug use: No    Review of Systems  Level 5 caveat for patient being nonverbal at this time.  ____________________________________________   PHYSICAL EXAM:  VITAL SIGNS: ED Triage Vitals  Enc Vitals Group     BP 09/19/18 1945 (!) 122/101     Pulse Rate 09/19/18 1945 (!) 116     Resp 09/19/18 1945 (!) 22     Temp --      Temp src --      SpO2  09/19/18 1945 95 %     Weight 09/19/18 1942 132 lb 4.4 oz (60 kg)     Height 09/19/18 1942 5\' 6"  (1.676 m)     Head Circumference --      [place] Flow --      Pain Score --      Pain Loc --      Pain Edu? --      Excl. in [place]? --     Constitutional: Patient is eyes open spontaneously but is nonverbal and not following commands.  Patient with GCS of 9 Eyes: Conjunctivae are normal.  Head: Atraumatic. Nose: No congestion/rhinnorhea. Mouth/Throat: His membranes are dry.  No vomitus visible in the mouth/pharynx. Neck: No stridor.   Cardiovascular: Tachycardic, regular rhythm. Grossly normal heart sounds.   Respiratory: Labored respirations with decreased air movement throughout the bilateral lung fields. Gastrointestinal: Soft and nontender. No distention.  PEG tube in place without any surrounding erythema or induration. Musculoskeletal: No lower extremity  edema.  No joint effusions. Neurologic: Moves all 4 extremities equally. Skin:  Skin is warm, dry and intact. No rash noted.  ____________________________________________   LABS (all labs ordered are listed, but only abnormal results are displayed)  Labs Reviewed  LACTIC ACID, PLASMA - Abnormal; Notable for the following components:      Result Value   Lactic Acid, Venous 2.6 (*)    All other components within normal limits  COMPREHENSIVE METABOLIC PANEL - Abnormal; Notable for the following components:   Glucose, Bld 202 (*)    Creatinine, Ser 0.53 (*)    Calcium 8.7 (*)    Alkaline Phosphatase 180 (*)    All other components within normal limits  CBC WITH DIFFERENTIAL/PLATELET - Abnormal; Notable for the following components:   WBC 12.7 (*)    RDW 16.1 (*)    Neutro Abs 9.6 (*)    All other components within normal limits  GLUCOSE, CAPILLARY - Abnormal; Notable for the following components:   Glucose-Capillary 174 (*)    All other components within normal limits  CULTURE, BLOOD (ROUTINE X 2)  CULTURE, BLOOD (ROUTINE X  2)  URINE CULTURE  TROPONIN I  LACTIC ACID, PLASMA  URINALYSIS, ROUTINE W REFLEX MICROSCOPIC   ____________________________________________  EKG  ED ECG REPORT I, Doran Stabler, the attending physician, personally viewed and interpreted this ECG.   Date: 09/19/2018  EKG Time: 1940  Rate: 115  Rhythm: sinus tachycardia  Axis: normal  Intervals:none  ST&T Change: No ST segment elevation or depression.  No abnormal T wave inversion.  ____________________________________________  RADIOLOGY  X-ray with mildly improved aeration from previous. ET tube approximately 1.3 cm from the carina. ____________________________________________   PROCEDURES  Procedure(s) performed:   Procedure Name: Intubation Date/Time: 09/19/2018 8:56 PM Performed by: Orbie Pyo, MD Pre-anesthesia Checklist: Patient identified, Emergency Drugs available, Suction available and Patient being monitored Oxygen Delivery Method: Nasal cannula Preoxygenation: Pre-oxygenation with 100% oxygen Induction Type: IV induction and Rapid sequence Ventilation: Mask ventilation without difficulty Laryngoscope Size: Glidescope and 4 Tube size: 7.5 mm Number of attempts: 1 Airway Equipment and Method: Patient positioned with wedge pillow Placement Confirmation: ETT inserted through vocal cords under direct vision,  CO2 detector and Breath sounds checked- equal and bilateral Secured at: 20 cm Tube secured with: ETT holder Dental Injury: Teeth and Oropharynx as per pre-operative assessment     .Critical Care Performed by: Orbie Pyo, MD Authorized by: Orbie Pyo, MD   Critical care provider statement:    Critical care time (minutes):  35   Critical care time was exclusive of:  Separately billable procedures and treating other patients   Critical care was necessary to treat or prevent imminent or life-threatening deterioration of the following conditions:  Respiratory  failure and CNS failure or compromise   Critical care was time spent personally by me on the following activities:  Development of treatment plan with patient or surrogate, discussions with consultants, evaluation of patient's response to treatment, examination of patient, obtaining history from patient or surrogate, ordering and performing treatments and interventions, ordering and review of laboratory studies, ordering and review of radiographic studies, pulse oximetry, re-evaluation of patient's condition and review of old charts    Critical Care performed:    ____________________________________________   INITIAL IMPRESSION / ASSESSMENT AND PLAN / ED COURSE  Pertinent labs & imaging results that were available during my care of the patient were reviewed by me and considered in my medical decision making (see chart for details).  Differential includes, but is not limited to, viral syndrome, bronchitis including COPD exacerbation, pneumonia, reactive airway disease including asthma, CHF including exacerbation with or without pulmonary/interstitial edema, pneumothorax, ACS, thoracic trauma, and pulmonary embolism. Differential diagnosis includes, but is not limited to, alcohol, illicit or prescription medications, or other toxic ingestion; intracranial pathology such as stroke or intracerebral hemorrhage; fever or infectious causes including sepsis; hypoxemia and/or hypercarbia; uremia; trauma; endocrine related disorders such as diabetes, hypoglycemia, and thyroid-related diseases; hypertensive encephalopathy; etc. As part of my medical decision making, I reviewed the following data within the electronic MEDICAL RECORD NUMBER Notes from prior ED visits  ----------------------------------------- 8:59 PM on 09/19/2018 -----------------------------------------  Prior to the intubation the patient's mental status was discussed with his wife who agrees with intubation.  Patient with already  aspirating once today.  GCS of 9.  Not responding to commands and not protecting his airway at this time.  Patient intubated and family updated regarding course of action with ICU admission as well as broad-spectrum antibiotics.  Patient sedated for intubation with etomidate secondary to Versed allergy.  Also with redness upon infusion of vancomycin.  Will give clindamycin dosing.  Sepsis protocol initiated.  Patient to be admitted to the ICU.  Signed out to Dr. Jannifer Franklin. ____________________________________________   FINAL CLINICAL IMPRESSION(S) / ED DIAGNOSES  Aspiration.  Altered mental status.  NEW MEDICATIONS STARTED DURING THIS VISIT:  New Prescriptions   No medications on file  Note:  This document was prepared using Dragon voice recognition software and may include unintentional dictation errors.     Orbie Pyo, MD 09/19/18 2100

## 2018-09-19 NOTE — ED Notes (Signed)
Per the wife, pt was at [facility] and when her daughter arrived pt was altered and had copious amounts of green sputum in his throat. Pt has a feeding tube in place and it is unsure whether he aspirated today with the feeding tube or pt has PNE and is unable to cough up his sputum. Wife states this morning pt was conscious A&O x 4. Now pt is unable to follow commands, speak or communicate with nothing other than moans, and grunts. Pt is strong and is pulling away when staff attempt to hold his arm for IV. MD at bedside. Wife and MD speak about intubation for safety. Wife verbalizes consent.

## 2018-09-19 NOTE — Consult Note (Addendum)
Pharmacy Antibiotic Note  Maurice Patterson is a 63 y.o. male admitted on 09/19/2018 with pneumonia.  Pharmacy has been consulted for Vancomycin/Cefepime dosing.  Plan: Pt received Cefepime 2g and Vancomycin 1g in the ED (this will serve as loading dose)  Will dose:  Cefepime 2g q 8 hours AND  Vancomycin 750 mg IV Q 12 hrs.   Goal AUC 400-550. Expected AUC: 477 SCr used: 0.8, Using TBW b/c TBW<IBW    Height: 5\' 6"  (167.6 cm) Weight: 132 lb 4.4 oz (60 kg) IBW/kg (Calculated) : 63.8  No data recorded.  No results for input(s): WBC, CREATININE, LATICACIDVEN, VANCOTROUGH, VANCOPEAK, VANCORANDOM, GENTTROUGH, GENTPEAK, GENTRANDOM, TOBRATROUGH, TOBRAPEAK, TOBRARND, AMIKACINPEAK, AMIKACINTROU, AMIKACIN in the last 168 hours.  Estimated Creatinine Clearance: 82.3 mL/min (A) (by C-G formula based on SCr of 0.58 mg/dL (L)).    Allergies  Allergen Reactions  . Hydrocodone Anaphylaxis  . Morphine Other (See Comments)    Loss of memory  . Ambien [Zolpidem] Other (See Comments)    delirium    . Brilinta [Ticagrelor] Other (See Comments)    Stroke   . Flexeril [Cyclobenzaprine] Other (See Comments)    delerium   . Flunitrazepam Other (See Comments)    ROHYPNOL (hallucinations)  . Haldol [Haloperidol Lactate] Other (See Comments)    delerium   . Levetiracetam Diarrhea and Other (See Comments)    Unable to walk  . Lorazepam Hives  . Risperdal [Risperidone] Other (See Comments)    Delirium   . Trazodone Other (See Comments)    Delirium Can take in low doses   . Benadryl [Diphenhydramine Hcl (Sleep)] Rash  . Penicillins Rash    Mouth ulcers Has patient had a PCN reaction causing immediate rash, facial/tongue/throat swelling, SOB or lightheadedness with hypotension: Yes Has patient had a PCN reaction causing severe rash involving mucus membranes or skin necrosis: No Has patient had a PCN reaction that required hospitalization No Has patient had a PCN reaction occurring within  the last 10 years: Yes If all of the above answers are "NO", then may proceed with Cephalosporin use.    Antimicrobials this admission: Vancomycin 1/28 >>  Cefepime 1/28 >>   Dose adjustments this admission: N/A  Microbiology results: 1/28 BCx: pending 1/28 UCx: pending    MRSA PCR not yet obtained/ordered  Thank you for allowing pharmacy to be a part of this patient's care.  Lu Duffel, PharmD, BCPS Clinical Pharmacist 09/19/2018 8:18 PM

## 2018-09-19 NOTE — Patient Outreach (Signed)
[facility]) Care Management  09/19/2018  Maurice Patterson 1956-02-08 [id number]  Post Acute Coordination call  Patient referred to [facility] to assist with the wheelchair ramp. They will call patient's spouse to discuss specifics of the referral.   Plan: This social worker to continue to follow patient's progress in rehab to assess for community resource needs.    Sheralyn Boatman [facility] Care Management [phone]

## 2018-09-19 NOTE — ED Notes (Signed)
Multiple attempts to insert an OG unsuccessful with different nurses and different size drains. MD aware.

## 2018-09-20 ENCOUNTER — Inpatient Hospital Stay: Payer: PPO

## 2018-09-20 DIAGNOSIS — J9601 Acute respiratory failure with hypoxia: Secondary | ICD-10-CM

## 2018-09-20 DIAGNOSIS — E43 Unspecified severe protein-calorie malnutrition: Secondary | ICD-10-CM

## 2018-09-20 LAB — GLUCOSE, CAPILLARY
Glucose-Capillary: 103 mg/dL — ABNORMAL HIGH (ref 70–99)
Glucose-Capillary: 132 mg/dL — ABNORMAL HIGH (ref 70–99)
Glucose-Capillary: 222 mg/dL — ABNORMAL HIGH (ref 70–99)
Glucose-Capillary: 239 mg/dL — ABNORMAL HIGH (ref 70–99)
Glucose-Capillary: 89 mg/dL (ref 70–99)
Glucose-Capillary: 92 mg/dL (ref 70–99)

## 2018-09-20 LAB — CBC
HCT: 39.8 % (ref 39.0–52.0)
Hemoglobin: 12.5 g/dL — ABNORMAL LOW (ref 13.0–17.0)
MCH: 30.3 pg (ref 26.0–34.0)
MCHC: 31.4 g/dL (ref 30.0–36.0)
MCV: 96.6 fL (ref 80.0–100.0)
PLATELETS: 164 10*3/uL (ref 150–400)
RBC: 4.12 MIL/uL — ABNORMAL LOW (ref 4.22–5.81)
RDW: 16.3 % — ABNORMAL HIGH (ref 11.5–15.5)
WBC: 9.4 10*3/uL (ref 4.0–10.5)
nRBC: 0 % (ref 0.0–0.2)

## 2018-09-20 LAB — MRSA PCR SCREENING: MRSA BY PCR: NEGATIVE

## 2018-09-20 LAB — BLOOD GAS, ARTERIAL
Acid-base deficit: 0.5 mmol/L (ref 0.0–2.0)
BICARBONATE: 24.2 mmol/L (ref 20.0–28.0)
FIO2: 0.4
MECHVT: 450 mL
O2 SAT: 96.1 %
PEEP: 5 cmH2O
PO2 ART: 83 mmHg (ref 83.0–108.0)
Patient temperature: 37
RATE: 16 resp/min
pCO2 arterial: 39 mmHg (ref 32.0–48.0)
pH, Arterial: 7.4 (ref 7.350–7.450)

## 2018-09-20 LAB — BASIC METABOLIC PANEL
Anion gap: 5 (ref 5–15)
BUN: 12 mg/dL (ref 8–23)
CO2: 25 mmol/L (ref 22–32)
Calcium: 7.8 mg/dL — ABNORMAL LOW (ref 8.9–10.3)
Chloride: 106 mmol/L (ref 98–111)
Creatinine, Ser: 0.5 mg/dL — ABNORMAL LOW (ref 0.61–1.24)
GFR calc Af Amer: >60 mL/min (ref >60)
GFR calc non Af Amer: >60 mL/min (ref >60)
Glucose, Bld: 92 mg/dL (ref 70–99)
Potassium: 3.9 mmol/L (ref 3.5–5.1)
SODIUM: 136 mmol/L (ref 135–145)

## 2018-09-20 LAB — PROCALCITONIN: PROCALCITONIN: 0.17 ng/mL

## 2018-09-20 LAB — STREP PNEUMONIAE URINARY ANTIGEN: Strep Pneumo Urinary Antigen: NEGATIVE

## 2018-09-20 MED ORDER — ADULT MULTIVITAMIN LIQUID CH
15.0000 mL | Freq: Every day | ORAL | Status: DC
  Administered 2018-09-20 – 2018-09-22 (×3): 15 mL
  Filled 2018-09-20 (×3): qty 15

## 2018-09-20 MED ORDER — ACETAMINOPHEN 650 MG RE SUPP
650.0000 mg | RECTAL | Status: DC | PRN
  Administered 2018-09-20 (×2): 650 mg via RECTAL
  Filled 2018-09-20 (×2): qty 1

## 2018-09-20 MED ORDER — VITAL 1.5 CAL PO LIQD
1000.0000 mL | ORAL | Status: DC
  Administered 2018-09-20 – 2018-09-21 (×2): 1000 mL

## 2018-09-20 MED ORDER — VITAL HIGH PROTEIN PO LIQD: 1000.0000 mL | ORAL | Status: DC

## 2018-09-20 MED ORDER — PRO-STAT SUGAR FREE PO LIQD
30.0000 mL | Freq: Three times a day (TID) | ORAL | Status: DC
  Administered 2018-09-20 – 2018-09-22 (×6): 30 mL

## 2018-09-20 MED ORDER — IOPAMIDOL (ISOVUE-300) INJECTION 61%
50.0000 mL | Freq: Once | INTRAVENOUS | Status: AC | PRN
  Administered 2018-09-20: 50 mL

## 2018-09-20 NOTE — Clinical Social Work Note (Signed)
CSW aware patient has been readmitted to hospital. Patient is well known to CSW and placed patient at [facility] which is where he has admitted from. Full assessment to follow. Shela Leff MSW,LCSW [phone]

## 2018-09-20 NOTE — Progress Notes (Signed)
[place] [facility] [place] NAME: Maurice Patterson    MR#:  [id number]  DATE OF BIRTH:  22-Sep-1955  SUBJECTIVE:  CHIEF COMPLAINT: Patient is intubated and sedated.  Sister at bedside  REVIEW OF SYSTEMS:  Review of system unobtainable  DRUG ALLERGIES:   Allergies  Allergen Reactions  . Hydrocodone Anaphylaxis  . Morphine Other (See Comments)    Loss of memory  . Ambien [Zolpidem] Other (See Comments)    delirium    . Brilinta [Ticagrelor] Other (See Comments)    Stroke   . Flexeril [Cyclobenzaprine] Other (See Comments)    delerium   . Flunitrazepam Other (See Comments)    ROHYPNOL (hallucinations)  . Haldol [Haloperidol Lactate] Other (See Comments)    delerium   . Levetiracetam Diarrhea and Other (See Comments)    Unable to walk  . Lorazepam Hives  . Risperdal [Risperidone] Other (See Comments)    Delirium   . Trazodone Other (See Comments)    Delirium Can take in low doses   . Benadryl [Diphenhydramine Hcl (Sleep)] Rash  . Penicillins Rash    Mouth ulcers Has patient had a PCN reaction causing immediate rash, facial/tongue/throat swelling, SOB or lightheadedness with hypotension: Yes Has patient had a PCN reaction causing severe rash involving mucus membranes or skin necrosis: No Has patient had a PCN reaction that required hospitalization No Has patient had a PCN reaction occurring within the last 10 years: Yes If all of the above answers are "NO", then may proceed with Cephalosporin use.  . Vancomycin Rash    Rash around IV site during vancomycin infusion    VITALS:  Blood pressure 101/70, pulse 97, temperature (!) 100.6 F (38.1 C), temperature source Bladder, resp. rate 13, height 5\' 6"  (1.676 m), weight 59.8 kg, SpO2 98 %.  PHYSICAL EXAMINATION:  GENERAL:  63 y.o.-year-old patient lying in the bed with no acute distress.  EYES: Pupils equal, round, reactive to light and accommodation. No scleral icterus.  Extraocular muscles intact.  HEENT: Head atraumatic, normocephalic.  ET tube intact NECK:  Supple, no jugular venous distention. No thyroid enlargement, no tenderness.  LUNGS: Moderate breath sounds bilaterally, no wheezing, rales,rhonchi or crepitation. No use of accessory muscles of respiration.  CARDIOVASCULAR: S1, S2 normal. No murmurs, rubs, or gallops.  ABDOMEN: Soft, nontender, nondistended. Bowel sounds present. EXTREMITIES: No pedal edema, cyanosis, or clubbing.  NEUROLOGIC: Patient is sedated. Gait not checked.  PSYCHIATRIC: The patient is sedated on vent  sKIN: No obvious rash, lesion, or ulcer.    LABORATORY PANEL:   CBC Recent Labs  Lab 09/20/18 0449  WBC 9.4  HGB 12.5*  HCT 39.8  PLT 164   ------------------------------------------------------------------------------------------------------------------  Chemistries  Recent Labs  Lab 09/19/18 2003 09/20/18 0449  NA 135 136  K 4.2 3.9  CL 100 106  CO2 26 25  GLUCOSE 202* 92  BUN 14 12  CREATININE 0.53* 0.50*  CALCIUM 8.7* 7.8*  AST 23  --   ALT 43  --   ALKPHOS 180*  --   BILITOT 0.7  --    ------------------------------------------------------------------------------------------------------------------  Cardiac Enzymes Recent Labs  Lab 09/19/18 2003  TROPONINI <0.03   ------------------------------------------------------------------------------------------------------------------  RADIOLOGY:  Dg Chest 1 View  Result Date: 09/19/2018 CLINICAL DATA:  Status post intubation. EXAM: CHEST  1 VIEW COMPARISON:  09/19/2018 at 1954 hours FINDINGS: New endotracheal tube tip projects 1.6 cm above the Carina. No change in the appearance of the lungs from the  recent prior exam. No new lung abnormalities. IMPRESSION: Well-positioned endotracheal tube. No other change from the prior study. Electronically Signed   By: Lajean Manes M.D.   On: 09/19/2018 21:00   Dg Abd 1 View  Result Date: 09/20/2018 CLINICAL  DATA:  Gastrostomy placement. EXAM: ABDOMEN - 1 VIEW COMPARISON:  Radiograph of September 11, 2018. FINDINGS: The bowel gas pattern is normal. Gastrostomy balloon tip appears to be within the gastric lumen. Contrast is noted within the gastric lumen and proximal duodenum. No radio-opaque calculi or other significant radiographic abnormality are seen. IMPRESSION: Gastrostomy tube appears to be well positioned within gastric lumen. Electronically Signed   By: Marijo Conception, M.D.   On: 09/20/2018 13:39   Dg Abd 1 View  Result Date: 09/19/2018 CLINICAL DATA:  Peg tube status EXAM: ABDOMEN - 1 VIEW COMPARISON:  09/09/2018 FINDINGS: Gastrostomy tube tip projects over the left upper quadrant. No free air, organomegaly or evidence of bowel obstruction. Moderate stool in the colon. IMPRESSION: Gastrostomy tube tip projects over the left upper quadrant. Moderate stool burden in the colon. Electronically Signed   By: Rolm Baptise M.D.   On: 09/19/2018 23:49   Ct Head Wo Contrast  Result Date: 09/19/2018 CLINICAL DATA:  Altered mental status, possible aspiration. Found down at rehab. History of stroke, dementia, hypertension, hyperlipidemia, pancreatic cancer. EXAM: CT HEAD WITHOUT CONTRAST TECHNIQUE: Contiguous axial images were obtained from the base of the skull through the vertex without intravenous contrast. COMPARISON:  CT HEAD August 25, 2018 FINDINGS: BRAIN: No intraparenchymal hemorrhage, mass effect nor midline shift. Moderate global parenchymal brain volume loss. No hydrocephalus. No hydrocephalus. Patchy supratentorial white matter hypodensities. Old small RIGHT cerebellar infarcts. No acute large vascular territory infarcts. No abnormal extra-axial fluid collections. Basal cisterns are patent. VASCULAR: Moderate calcific atherosclerosis of the carotid siphons. SKULL: No skull fracture. No significant scalp soft tissue swelling. SINUSES/ORBITS: Mild paranasal sinus mucosal thickening. Mastoid air cells are  well aerated.The included ocular globes and orbital contents are non-suspicious. OTHER: Patient is edentulous. IMPRESSION: 1. No acute intracranial process. 2. Stable moderate parenchymal brain volume loss, advanced for age. 3. Old small RIGHT cerebellar infarct and mild chronic small vessel ischemic changes. Electronically Signed   By: Elon Alas M.D.   On: 09/19/2018 22:54   Portable Chest Xray  Result Date: 09/20/2018 CLINICAL DATA:  Acute respiratory failure. EXAM: PORTABLE CHEST 1 VIEW COMPARISON:  09/19/2018 and 08/30/2018 FINDINGS: Endotracheal tube in good position 4.6 cm above the carina. The heart size and pulmonary vascularity are normal. There is chronic accentuation of the interstitial markings with new slight atelectasis at the right lung base. No acute bone abnormality. Multiple old healed right posterior rib fractures. IMPRESSION: New slight atelectasis at the left lung base.  No other change. Electronically Signed   By: Lorriane Shire M.D.   On: 09/20/2018 07:11   Dg Chest Port 1 View  Result Date: 09/19/2018 CLINICAL DATA:  Possible aspiration EXAM: PORTABLE CHEST 1 VIEW COMPARISON:  08/30/2018, 08/29/2018, 08/25/2018, 08/17/2018, 12/12/2017 FINDINGS: Mild bibasilar airspace disease, slightly improved since 08/30/2018. No new opacity or pleural effusion. Stable cardiomediastinal silhouette. No pneumothorax. Old right-sided rib fractures. IMPRESSION: Slightly improved airspace disease at both lung bases Electronically Signed   By: Donavan Foil M.D.   On: 09/19/2018 20:10    EKG:   Orders placed or performed during the hospital encounter of 09/19/18  . EKG 12-Lead  . EKG 12-Lead  . ED EKG 12-Lead  . ED EKG 12-Lead  ASSESSMENT AND PLAN:   #Acute respiratory failure secondary to aspiration pneumonia Got intubated on vent Vent management per protocol Follow-up with intensivist  #Sepsis due to aspiration pneumonia IV antibiotics cefepime, clindamycin IV  fluids  #Diabetes mellitus sliding scale insulin  #GERD Pepcid  #Essential hypertension-blood pressure is stable at this time continue close monitoring  #Severe malnutrition on nutritional supplements like ~1.5 cal and pro-stat  All the records are reviewed and case discussed with Care Management/Social Workerr. Management plans discussed with the patient sister at bedside. CODE STATUS: fc  TOTAL TIME TAKING CARE OF THIS PATIENT: 36  minutes.   POSSIBLE D/C IN ? DAYS, DEPENDING ON CLINICAL CONDITION.  Note: This dictation was prepared with Dragon dictation along with smaller phrase technology. Any transcriptional errors that result from this process are unintentional.   [name] M.D on 09/20/2018 at 2:19 PM  Between 7am to 6pm - Pager - [phone] After 6pm go to [email or web address] - password [email or web address] [facility]  [facility]  Office  [phone]  CC: Primary care physician; [name], MD

## 2018-09-20 NOTE — Progress Notes (Addendum)
Initial Nutrition Assessment  DOCUMENTATION CODES:   Severe malnutrition in context of acute illness/injury  INTERVENTION:  Once G-tube approved for use, recommend initiating Vital 1.5 Cal at 40 mL/hr (960 mL goal daily volume) + Pro-Stat 30 mL TID per G-tube. Provides 1740 kcal, 110 grams of protein, 730 mL H2O daily.  Provide liquid MVI daily per tube.  Provide minimum free water flush of 30 mL Q4hrs to maintain tube patency.  NUTRITION DIAGNOSIS:   Severe Malnutrition related to acute illness(dysphagia s/p placement of G-tube for tube feeding, catabolic nature of critical illness, catabolic nature of CHF and CKD) as evidenced by moderate fat depletion, moderate muscle depletion, 16.5% weight loss over 2 months.  GOAL:   Provide needs based on ASPEN/SCCM guidelines  MONITOR:   Vent status, Labs, Weight trends, TF tolerance, Skin, I & O's  REASON FOR ASSESSMENT:   Ventilator    ASSESSMENT:   63 year old male with PMHx of seizures, dementia, arthritis, HTN, DM, malignant intraductal papillary mucinous tumor of pancreas, hernia of abdominal cavity, hx HSV encephalitis,  hx MI, hx TIA, CAD, CHF, GERD, pancreatitis, CKD, recent admission 07/22/2018-09/08/2018 for acute encephalopathy, acute hypoxic respiratory failure requiring intubation 12/7-12/11 and 12/17-12/23, and dysphagia s/p placement of G-tube who now presents from [facility] with severe hypoxic and hypercapnic respiratory from aspiration PNA requiring intubation on 1/28, also with questionable G-tube malposition.   -Patient originally underwent PEG tube placement on 1/6 (20 Fr. EndoVive Safety gastrostomy tube). Patient later pulled that tube out and IR replaced with an 18 Fr. balloon retention G-tube on 1/13. It appear patient was brought to ER on 1/18 from [facility] after pulling out his tube again and ER physician replaced with another 18 Fr. G-tube. -Last admission patient was only able to tolerate tube feeds  over 24 hours. He did not tolerate a higher rate nocturnally and vomited when this was tried. Therefore, it was felt he certainly would not tolerate bolus feeding as they would infuse at an even higher rate with gravity (237 mL infuses in about 10 minutes usually).  Patient intubated. Sedation turned off at time of RD assessment but noted fentanyl gtt has since been restarted. On PRVC mode with FiO2 40% and PEEP 5 cmH2O. Patient's sister is at bedside. She reports as far as she knows patient was tolerating tube feeds at [facility]. Per paper chart from facility he was on Glucerna 1.5 Cal at 55 mL/hr over 22 hours (1210 mL, 1815 kcal, 100 grams of protein, 913 mL H2O) plus free water flush of 40 mL Q1hour (total of 1873 mL H2O daily including water in tube feeding). Patient's weight has been trending down over time. He was 157.8 lbs on 07/23/2018, 154.8 lbs on 08/16/2018, 136.4 lbs on 09/01/2018, and is currently 59.8 kg (131.8 lbs). He has lost 26 lbs (16.5% body weight) over the past 2 months, which is significant for time frame.  Enteral Access: 18 Fr. G-tube present on admission; pending GI consult to assess if tube is in appropriate position; per abdominal x-ray yesterday gastrostomy tube tip projects "over the left upper quadrant" Noted there was another abdominal x-ray pending for today  MAP: 67-92 mmHg  Patient is currently intubated on ventilator support Ve: 8.3 L/min Temp (24hrs), Avg:100.7 F (38.2 C), Min:98.6 F (37 C), Max:101.3 F (38.5 C)  Propofol: N/A  Medications reviewed and include: Novolog 0-15 units Q4hrs, clindamycin, famotidine, fentanyl gtt, LR @ 50 mL/hr.  Labs reviewed: CBG 89-103, Creatinine 0.5.  I/O: 220 mL UOP overnight  Discussed with RN and on rounds. Once G-tube cleared for use plan is to initiate tube feeds.  NUTRITION - FOCUSED PHYSICAL EXAM:    Most Recent Value  Orbital Region  Moderate depletion  Upper Arm Region  Moderate depletion  Thoracic  and Lumbar Region  Moderate depletion  Buccal Region  Unable to assess  Temple Region  Severe depletion  Clavicle Bone Region  Moderate depletion  Clavicle and Acromion Bone Region  Moderate depletion  Scapular Bone Region  Unable to assess  Dorsal Hand  Moderate depletion  Patellar Region  Moderate depletion  Anterior Thigh Region  Moderate depletion  Posterior Calf Region  Moderate depletion  Edema (RD Assessment)  None  Hair  Reviewed  Eyes  Unable to assess  Mouth  Unable to assess  Skin  Reviewed  Nails  Reviewed     Diet Order:   Diet Order            Diet NPO time specified  Diet effective now             EDUCATION NEEDS:   Not appropriate for education at this time  Skin:  Skin Assessment: Skin Integrity Issues:(MSAD to buttocks, coccyx; ecchymosis to bilateral arms)  Last BM:  09/19/2018 per chart  Height:   Ht Readings from Last 1 Encounters:  09/19/18 5\' 6"  (1.676 m)   Weight:   Wt Readings from Last 1 Encounters:  09/20/18 59.8 kg   Ideal Body Weight:  64.5 kg  BMI:  Body mass index is 21.28 kg/m.  Estimated Nutritional Needs:   Kcal:  3361 (PSU 2003b w/ MSJ 1350, Ve 8.3, Tmax 38.5)  Protein:  90-108 grams (1.5-1.8 grams/kg)  Fluid:  1.8 L/day (30 mL/kg)  Willey Blade, MS, RD, LDN Office: [phone] Pager: [phone] After Hours/Weekend Pager: [phone]

## 2018-09-20 NOTE — Consult Note (Signed)
[facility] GI Inpatient Consult Note   Kathline Magic, M.D.  Reason for Consult: Dysfunctional PEG tube   Attending Requesting Consult: Terri Piedra, M.D.  History of Present Illness: Maurice Patterson is a 63 y.o. male with a recent history of prolonged hospitalization secondary to aspiration pneumonia requiring a prolonged hospitalization here at [facility] from 07/22/2018 through 09/08/2018.  Patient was readmitted on 09/19/2018 for recurrent symptoms of aspiration.  Patient's family apparently found the patient aspirate making guttural noises and in the prone position.  Patient unable to follow commands or converse verbally. There is been some recent history by healthcare workers either at the rehab facility or in the emergency room (I do not know which) where there was some claimed that the PEG tube had been partially removed.  I have been asked to see the patient to assess the integrity of the gastrostomy tube and to perform any studies on her to confirm correct placement prior to usage of the tube itself.  Past Medical History:  Past Medical History:  Diagnosis Date  . Acute MI ([place])   . Arthritis   . Back pain   . CAD (coronary artery disease)   . CHF (congestive heart failure) ([place])   . Chronic kidney disease   . Degenerative lumbar disc   . Dementia ([place])   . Diabetes mellitus without complication ([place])   . GERD (gastroesophageal reflux disease)   . Hernia of abdominal cavity   . Hyperlipemia   . Hypertension   . Malignant intraductal papillary mucinous tumor of pancreas ([place])   . Memory loss   . Pancreatitis   . Seizures ([place])    staring spells  . Shingles   . Stroke ([place]) 08/2017  . TIA (transient ischemic attack)     Problem List: Patient Active Problem List   Diagnosis Date Noted  . Protein-calorie malnutrition, severe 09/20/2018  . Diabetes ([place]) 09/19/2018  . Sepsis ([place]) 09/19/2018  . Pressure injury of skin 08/12/2018  .  Aspiration pneumonia ([place])   . Palliative care encounter   . Dysphagia   . Acute respiratory failure with hypoxia ([place])   . Fever   . CAP (community acquired pneumonia) 07/22/2018  . GERD (gastroesophageal reflux disease) 07/22/2018  . Hypersalivation 01/30/2018  . Drooling 01/30/2018  . Alzheimer's dementia ([place]) 05/01/2017  . Cognitive decline 10/20/2016  . Chest pain, rule out acute myocardial infarction 04/05/2016  . Confusion 10/18/2015  . Constipation 08/23/2015  . Uncontrolled type 2 diabetes mellitus with hyperglycemia, with long-term current use of insulin ([place]) 08/23/2015  . Essential hypertension 08/23/2015  . Right lower lobe pneumonia ([place]) 08/23/2015  . Abdominal pain 08/21/2015  . Altered mental status 02/19/2015    Past Surgical History: Past Surgical History:  Procedure Laterality Date  . CARDIAC CATHETERIZATION    . CORONARY ANGIOPLASTY    . CYSTOSCOPY WITH STENT PLACEMENT Right 05/13/2017   Procedure: CYSTOSCOPY WITH STENT PLACEMENT;  Surgeon: Nickie Retort, MD;  Location: [facility] ORS;  Service: Urology;  Laterality: Right;  . ERCP N/A 09/12/2015   Procedure: ENDOSCOPIC RETROGRADE CHOLANGIOPANCREATOGRAPHY (ERCP);  Surgeon: Hulen Luster, MD;  Location: [facility] ENDOSCOPY;  Service: Gastroenterology;  Laterality: N/A;  . ERCP N/A 01/02/2016   Procedure: ENDOSCOPIC RETROGRADE CHOLANGIOPANCREATOGRAPHY (ERCP);  Surgeon: Hulen Luster, MD;  Location: [facility] ENDOSCOPY;  Service: Gastroenterology;  Laterality: N/A;  . EXTERNAL FIXATION WRIST FRACTURE Left   . left arm metal plate    . LEFT HEART CATH AND CORONARY  ANGIOGRAPHY Left 11/04/2016   Procedure: Left Heart Cath and Coronary Angiography;  Surgeon: Isaias Cowman, MD;  Location: [place] CV LAB;  Service: Cardiovascular;  Laterality: Left;  . Left shoulder surgery    . PEG PLACEMENT Left 08/28/2018   Procedure: PERCUTANEOUS ENDOSCOPIC GASTROSTOMY (PEG) PLACEMENT;  Surgeon: Lin Landsman, MD;  Location: [facility]  ENDOSCOPY;  Service: Gastroenterology;  Laterality: Left;  . Stent times 3  2013   Cardiac  . TOTAL ELBOW REPLACEMENT Left   . VASECTOMY    . VENTRICULOPERITONEAL SHUNT      Allergies: Allergies  Allergen Reactions  . Hydrocodone Anaphylaxis  . Morphine Other (See Comments)    Loss of memory  . Ambien [Zolpidem] Other (See Comments)    delirium    . Brilinta [Ticagrelor] Other (See Comments)    Stroke   . Flexeril [Cyclobenzaprine] Other (See Comments)    delerium   . Flunitrazepam Other (See Comments)    ROHYPNOL (hallucinations)  . Haldol [Haloperidol Lactate] Other (See Comments)    delerium   . Levetiracetam Diarrhea and Other (See Comments)    Unable to walk  . Lorazepam Hives  . Risperdal [Risperidone] Other (See Comments)    Delirium   . Trazodone Other (See Comments)    Delirium Can take in low doses   . Benadryl [Diphenhydramine Hcl (Sleep)] Rash  . Penicillins Rash    Mouth ulcers Has patient had a PCN reaction causing immediate rash, facial/tongue/throat swelling, SOB or lightheadedness with hypotension: Yes Has patient had a PCN reaction causing severe rash involving mucus membranes or skin necrosis: No Has patient had a PCN reaction that required hospitalization No Has patient had a PCN reaction occurring within the last 10 years: Yes If all of the above answers are "NO", then may proceed with Cephalosporin use.  . Vancomycin Rash    Rash around IV site during vancomycin infusion    Home Medications: Medications Prior to Admission  Medication Sig Dispense Refill Last Dose  . acetaminophen (TYLENOL) 500 MG tablet Place 1 tablet (500 mg total) into feeding tube every 6 (six) hours as needed for mild pain. 30 tablet 0 prn at prn  . albuterol (PROVENTIL) (2.5 MG/3ML) 0.083% nebulizer solution Take 3 mLs (2.5 mg total) by nebulization every 3 (three) hours as needed for wheezing or shortness of breath. 75 mL 12 prn at prn  . aspirin 81 MG chewable tablet  Place 1 tablet (81 mg total) into feeding tube daily.   09/19/2018 at 0730  . cholecalciferol (D-VI-SOL) 10 MCG/ML LIQD Place 2.5 mLs (1,000 Units total) into feeding tube daily. 120 mL  09/19/2018 at 0730  . clopidogrel (PLAVIX) 75 MG tablet Place 1 tablet (75 mg total) into feeding tube daily.   09/19/2018 at 0730  . donepezil (ARICEPT) 10 MG tablet Place 1 tablet (10 mg total) into feeding tube 2 (two) times daily.   09/18/2018 at 2100  . insulin aspart (NOVOLOG) 100 UNIT/ML injection Inject 3 Units into the skin every 6 (six) hours. 10 mL 11 09/19/2018 at 0730  . insulin glargine (LANTUS) 100 UNIT/ML injection Inject 0.12 mLs (12 Units total) into the skin daily. 10 mL 11 09/19/2018 at 2100  . memantine (NAMENDA) 10 MG tablet Place 1 tablet (10 mg total) into feeding tube 2 (two) times daily.   09/19/2018 at 0730  . metoprolol tartrate (LOPRESSOR) 25 MG tablet Place 1 tablet (25 mg total) into feeding tube 2 (two) times daily.   09/19/2018 at 0730  .  Nutritional Supplements (FEEDING SUPPLEMENT, GLUCERNA 1.5 CAL,) LIQD Place 1,000 mLs into feeding tube continuous.   09/19/2018 at 0730  . pantoprazole sodium (PROTONIX) 40 mg/20 mL PACK Place 20 mLs (40 mg total) into feeding tube daily. 30 each  09/19/2018 at 0730  . potassium chloride (KLOR-CON) 20 MEQ packet Place 20 mEq into feeding tube daily.   09/19/2018 at 0730  . Water For Irrigation, Sterile (FREE WATER) SOLN Place 180 mLs into feeding tube every 4 (four) hours.   09/19/2018 at [place] medication reconciliation was completed with the patient.   Scheduled Inpatient Medications:   . chlorhexidine gluconate (MEDLINE KIT)  15 mL Mouth Rinse BID  . enoxaparin (LOVENOX) injection  40 mg Subcutaneous Q24H  . feeding supplement (PRO-STAT SUGAR FREE 64)  30 mL Per Tube TID  . feeding supplement (VITAL 1.5 CAL)  1,000 mL Per Tube Q24H  . insulin aspart  0-15 Units Subcutaneous Q4H  . mouth rinse  15 mL Mouth Rinse 10 times per day  . multivitamin  15  mL Per Tube Daily    Continuous Inpatient Infusions:   . clindamycin (CLEOCIN) IV Stopped (09/20/18 1023)  . famotidine (PEPCID) IV Stopped (09/20/18 0826)  . fentaNYL infusion INTRAVENOUS 12.5 mcg/hr (09/20/18 1400)  . lactated ringers 50 mL/hr at 09/20/18 1400  . propofol (DIPRIVAN) infusion Stopped (09/20/18 1019)    PRN Inpatient Medications:  acetaminophen, acetaminophen, ipratropium-albuterol, ondansetron (ZOFRAN) IV  Family History: family history includes ALS in his mother; Alzheimer's disease in his father; CAD in his mother; Diabetes in his father and mother; Heart Problems in his brother; Hyperlipidemia in his mother; Hypertension in his mother; Stroke in his mother.   GI Family History: Negative  Social History:   reports that he quit smoking about 7 years ago. He has never used smokeless tobacco. He reports that he does not drink alcohol or use drugs. The patient denies ETOH, tobacco, or drug use.    Review of Systems: Review of Systems - Unobtainable due to patient obtunded state  Physical Examination: BP 107/74   Pulse 93   Temp (!) 101.1 F (38.4 C)   Resp 15   Ht _0  (1.676 m)   Wt 59.8 kg   SpO2 98%   BMI 21.28 kg/m  Physical Exam Vitals signs reviewed.  Constitutional:      Appearance: He is ill-appearing, toxic-appearing and diaphoretic.  HENT:     Head: Normocephalic and atraumatic.     Right Ear: External ear normal.     Left Ear: External ear normal.     Nose: Nose normal.     Mouth/Throat:     Mouth: Mucous membranes are dry.     Comments: Patient intubated on ventilator Eyes:     General: No scleral icterus.    Pupils: Pupils are equal, round, and reactive to light.  Cardiovascular:     Rate and Rhythm: Tachycardia present.     Pulses: Normal pulses.  Pulmonary:     Effort: No respiratory distress.     Breath sounds: No stridor. Rhonchi present.  Chest:     Chest wall: No tenderness.  Abdominal:     General: Abdomen is flat.      Comments: Gastrostomy tube is noted in the left upper quadrant.  The wound appears to be well-healed and the tube appears to be intact with approximately 3 cm measurement on the tube at the abdominal skin.  I note no erythema with a or induration.  The bumper for the PEG tube had slid distally towards the feeding port, suggesting possibly to some observers that the tube had slid backward.  The bumper was snugged to the abdominal wall into the original position.  Skin:    General: Skin is warm.     Capillary Refill: Capillary refill takes less than 2 seconds.  Psychiatric:     Comments: Patient obtunded without any purposeful movement of the extremities.  Patient does not follow any verbal commands.     Data: Lab Results  Component Value Date   WBC 9.4 09/20/2018   HGB 12.5 (L) 09/20/2018   HCT 39.8 09/20/2018   MCV 96.6 09/20/2018   PLT 164 09/20/2018   Recent Labs  Lab 09/19/18 2003 09/20/18 0449  HGB 14.2 12.5*   Lab Results  Component Value Date   NA 136 09/20/2018   K 3.9 09/20/2018   CL 106 09/20/2018   CO2 25 09/20/2018   BUN 12 09/20/2018   CREATININE 0.50 (L) 09/20/2018   Lab Results  Component Value Date   ALT 43 09/19/2018   AST 23 09/19/2018   ALKPHOS 180 (H) 09/19/2018   BILITOT 0.7 09/19/2018   No results for input(s): APTT, INR, PTT in the last 168 hours. CBC Latest Ref Rng & Units 09/20/2018 09/19/2018 09/02/2018  WBC 4.0 - 10.5 K/uL 9.4 12.7(H) 6.7  Hemoglobin 13.0 - 17.0 g/dL 12.5(L) 14.2 13.6  Hematocrit 39.0 - 52.0 % 39.8 42.7 42.6  Platelets 150 - 400 K/uL 164 215 254    STUDIES: Dg Chest 1 View  Result Date: 09/19/2018 CLINICAL DATA:  Status post intubation. EXAM: CHEST  1 VIEW COMPARISON:  09/19/2018 at 1954 hours FINDINGS: New endotracheal tube tip projects 1.6 cm above the Carina. No change in the appearance of the lungs from the recent prior exam. No new lung abnormalities. IMPRESSION: Well-positioned endotracheal tube. No other change from the  prior study. Electronically Signed   By: Lajean Manes M.D.   On: 09/19/2018 21:00   Dg Abd 1 View  Result Date: 09/20/2018 CLINICAL DATA:  Gastrostomy placement. EXAM: ABDOMEN - 1 VIEW COMPARISON:  Radiograph of September 11, 2018. FINDINGS: The bowel gas pattern is normal. Gastrostomy balloon tip appears to be within the gastric lumen. Contrast is noted within the gastric lumen and proximal duodenum. No radio-opaque calculi or other significant radiographic abnormality are seen. IMPRESSION: Gastrostomy tube appears to be well positioned within gastric lumen. Electronically Signed   By: Marijo Conception, M.D.   On: 09/20/2018 13:39   Dg Abd 1 View  Result Date: 09/19/2018 CLINICAL DATA:  Peg tube status EXAM: ABDOMEN - 1 VIEW COMPARISON:  09/09/2018 FINDINGS: Gastrostomy tube tip projects over the left upper quadrant. No free air, organomegaly or evidence of bowel obstruction. Moderate stool in the colon. IMPRESSION: Gastrostomy tube tip projects over the left upper quadrant. Moderate stool burden in the colon. Electronically Signed   By: Rolm Baptise M.D.   On: 09/19/2018 23:49   Ct Head Wo Contrast  Result Date: 09/19/2018 CLINICAL DATA:  Altered mental status, possible aspiration. Found down at rehab. History of stroke, dementia, hypertension, hyperlipidemia, pancreatic cancer. EXAM: CT HEAD WITHOUT CONTRAST TECHNIQUE: Contiguous axial images were obtained from the base of the skull through the vertex without intravenous contrast. COMPARISON:  CT HEAD August 25, 2018 FINDINGS: BRAIN: No intraparenchymal hemorrhage, mass effect nor midline shift. Moderate global parenchymal brain volume loss. No hydrocephalus. No hydrocephalus. Patchy supratentorial white matter hypodensities. Old small RIGHT  cerebellar infarcts. No acute large vascular territory infarcts. No abnormal extra-axial fluid collections. Basal cisterns are patent. VASCULAR: Moderate calcific atherosclerosis of the carotid siphons. SKULL: No  skull fracture. No significant scalp soft tissue swelling. SINUSES/ORBITS: Mild paranasal sinus mucosal thickening. Mastoid air cells are well aerated.The included ocular globes and orbital contents are non-suspicious. OTHER: Patient is edentulous. IMPRESSION: 1. No acute intracranial process. 2. Stable moderate parenchymal brain volume loss, advanced for age. 3. Old small RIGHT cerebellar infarct and mild chronic small vessel ischemic changes. Electronically Signed   By: Elon Alas M.D.   On: 09/19/2018 22:54   Portable Chest Xray  Result Date: 09/20/2018 CLINICAL DATA:  Acute respiratory failure. EXAM: PORTABLE CHEST 1 VIEW COMPARISON:  09/19/2018 and 08/30/2018 FINDINGS: Endotracheal tube in good position 4.6 cm above the carina. The heart size and pulmonary vascularity are normal. There is chronic accentuation of the interstitial markings with new slight atelectasis at the right lung base. No acute bone abnormality. Multiple old healed right posterior rib fractures. IMPRESSION: New slight atelectasis at the left lung base.  No other change. Electronically Signed   By: Lorriane Shire M.D.   On: 09/20/2018 07:11   Dg Chest Port 1 View  Result Date: 09/19/2018 CLINICAL DATA:  Possible aspiration EXAM: PORTABLE CHEST 1 VIEW COMPARISON:  08/30/2018, 08/29/2018, 08/25/2018, 08/17/2018, 12/12/2017 FINDINGS: Mild bibasilar airspace disease, slightly improved since 08/30/2018. No new opacity or pleural effusion. Stable cardiomediastinal silhouette. No pneumothorax. Old right-sided rib fractures. IMPRESSION: Slightly improved airspace disease at both lung bases Electronically Signed   By: Donavan Foil M.D.   On: 09/19/2018 20:10   _0 @  Assessment: 1.  Presumptive dysfunction of gastrostomy tube- the tube appears to be functional though recent history places into question whether the tube has been dislodged from its original position.  There are no external signs of the tube has been pulled on  forcefully.  No external signs of buried bumper syndrome. 2.  Sepsis secondary to aspiration pneumonia. 3.  Respiratory failure on ventilatory support.  Recommendations: 1.  We will obtain a abdominal x-ray with Gastrografin contrast to confirm placement of the tube in the gastric cavity. 2.  Provided placement appears adequate, we will resume medication, water flushes, tube feedings, etc. through the tube today.  Thank you for the consult. Please call with questions or concerns.  Addendum: Gastrografin tube study shows that the gastrostomy tube is in place in the gastric cavity.  Will resume feedings and other essential medications and water flushes through the PEG at this time. Will sign off.  Call back in the interim if needed.  Olean Ree, "Lanny Hurst MD [facility] [name],  [phone]  09/20/2018 3:20 PM

## 2018-09-21 ENCOUNTER — Encounter: Payer: Self-pay | Admitting: *Deleted

## 2018-09-21 ENCOUNTER — Other Ambulatory Visit: Payer: Self-pay | Admitting: Adult Health

## 2018-09-21 ENCOUNTER — Inpatient Hospital Stay: Payer: PPO

## 2018-09-21 LAB — MAGNESIUM: MAGNESIUM: 1.8 mg/dL (ref 1.7–2.4)

## 2018-09-21 LAB — BASIC METABOLIC PANEL
Anion gap: 4 — ABNORMAL LOW (ref 5–15)
BUN: 13 mg/dL (ref 8–23)
CO2: 25 mmol/L (ref 22–32)
Calcium: 7.9 mg/dL — ABNORMAL LOW (ref 8.9–10.3)
Chloride: 106 mmol/L (ref 98–111)
Creatinine, Ser: 0.37 mg/dL — ABNORMAL LOW (ref 0.61–1.24)
GFR calc Af Amer: >60 mL/min (ref >60)
GFR calc non Af Amer: >60 mL/min (ref >60)
Glucose, Bld: 190 mg/dL — ABNORMAL HIGH (ref 70–99)
Potassium: 3.7 mmol/L (ref 3.5–5.1)
Sodium: 135 mmol/L (ref 135–145)

## 2018-09-21 LAB — GLUCOSE, CAPILLARY
Glucose-Capillary: 172 mg/dL — ABNORMAL HIGH (ref 70–99)
Glucose-Capillary: 178 mg/dL — ABNORMAL HIGH (ref 70–99)
Glucose-Capillary: 180 mg/dL — ABNORMAL HIGH (ref 70–99)
Glucose-Capillary: 199 mg/dL — ABNORMAL HIGH (ref 70–99)
Glucose-Capillary: 252 mg/dL — ABNORMAL HIGH (ref 70–99)
Glucose-Capillary: 256 mg/dL — ABNORMAL HIGH (ref 70–99)

## 2018-09-21 LAB — CBC
HEMATOCRIT: 33.3 % — AB (ref 39.0–52.0)
Hemoglobin: 10.8 g/dL — ABNORMAL LOW (ref 13.0–17.0)
MCH: 31.1 pg (ref 26.0–34.0)
MCHC: 32.4 g/dL (ref 30.0–36.0)
MCV: 96 fL (ref 80.0–100.0)
PLATELETS: 130 10*3/uL — AB (ref 150–400)
RBC: 3.47 MIL/uL — ABNORMAL LOW (ref 4.22–5.81)
RDW: 16.5 % — ABNORMAL HIGH (ref 11.5–15.5)
WBC: 8.7 10*3/uL (ref 4.0–10.5)
nRBC: 0 % (ref 0.0–0.2)

## 2018-09-21 LAB — LEGIONELLA PNEUMOPHILA SEROGP 1 UR AG: L. pneumophila Serogp 1 Ur Ag: NEGATIVE

## 2018-09-21 LAB — URINE CULTURE: Culture: NO GROWTH

## 2018-09-21 LAB — PHOSPHORUS: Phosphorus: 2.7 mg/dL (ref 2.5–4.6)

## 2018-09-21 LAB — PROCALCITONIN: PROCALCITONIN: 0.14 ng/mL

## 2018-09-21 MED ORDER — ASPIRIN 81 MG PO CHEW
81.0000 mg | CHEWABLE_TABLET | Freq: Every day | ORAL | Status: DC
  Administered 2018-09-21 – 2018-09-22 (×2): 81 mg
  Filled 2018-09-21 (×2): qty 1

## 2018-09-21 MED ORDER — CLINDAMYCIN PHOSPHATE 600 MG/50ML IV SOLN
600.0000 mg | Freq: Three times a day (TID) | INTRAVENOUS | Status: DC
  Administered 2018-09-21 – 2018-09-22 (×3): 600 mg via INTRAVENOUS
  Filled 2018-09-21 (×5): qty 50

## 2018-09-21 MED ORDER — CLOPIDOGREL BISULFATE 75 MG PO TABS
75.0000 mg | ORAL_TABLET | Freq: Every day | ORAL | Status: DC
  Administered 2018-09-21 – 2018-09-22 (×2): 75 mg
  Filled 2018-09-21 (×2): qty 1

## 2018-09-21 MED ORDER — INSULIN ASPART 100 UNIT/ML ~~LOC~~ SOLN
0.0000 [IU] | SUBCUTANEOUS | Status: DC
  Administered 2018-09-21 (×2): 3 [IU] via SUBCUTANEOUS
  Administered 2018-09-21: 8 [IU] via SUBCUTANEOUS
  Administered 2018-09-22: 2 [IU] via SUBCUTANEOUS
  Administered 2018-09-22 (×2): 3 [IU] via SUBCUTANEOUS
  Administered 2018-09-22: 5 [IU] via SUBCUTANEOUS
  Administered 2018-09-22: 2 [IU] via SUBCUTANEOUS
  Administered 2018-09-23: 5 [IU] via SUBCUTANEOUS
  Administered 2018-09-23: 2 [IU] via SUBCUTANEOUS
  Administered 2018-09-23: 3 [IU] via SUBCUTANEOUS
  Administered 2018-09-23 – 2018-09-24 (×2): 2 [IU] via SUBCUTANEOUS
  Administered 2018-09-24: 5 [IU] via SUBCUTANEOUS
  Administered 2018-09-24: 3 [IU] via SUBCUTANEOUS
  Administered 2018-09-24: 2 [IU] via SUBCUTANEOUS
  Administered 2018-09-24 – 2018-09-25 (×3): 3 [IU] via SUBCUTANEOUS
  Administered 2018-09-25: 2 [IU] via SUBCUTANEOUS
  Administered 2018-09-25: 3 [IU] via SUBCUTANEOUS
  Administered 2018-09-25: 2 [IU] via SUBCUTANEOUS
  Administered 2018-09-25 – 2018-09-26 (×2): 3 [IU] via SUBCUTANEOUS
  Administered 2018-09-26: 2 [IU] via SUBCUTANEOUS
  Administered 2018-09-26 (×2): 3 [IU] via SUBCUTANEOUS
  Administered 2018-09-27 (×4): 2 [IU] via SUBCUTANEOUS
  Administered 2018-09-27: 3 [IU] via SUBCUTANEOUS
  Administered 2018-09-27 – 2018-09-28 (×3): 2 [IU] via SUBCUTANEOUS
  Administered 2018-09-28 – 2018-09-29 (×3): 3 [IU] via SUBCUTANEOUS
  Filled 2018-09-21 (×39): qty 1

## 2018-09-21 MED ORDER — MEMANTINE HCL 10 MG PO TABS
10.0000 mg | ORAL_TABLET | Freq: Every day | ORAL | Status: DC
  Administered 2018-09-21 – 2018-09-22 (×2): 10 mg
  Filled 2018-09-21 (×2): qty 1

## 2018-09-21 MED ORDER — DONEPEZIL HCL 5 MG PO TABS
10.0000 mg | ORAL_TABLET | Freq: Every day | ORAL | Status: DC
  Administered 2018-09-21: 10 mg
  Filled 2018-09-21 (×2): qty 2

## 2018-09-21 MED ORDER — MAGNESIUM SULFATE 2 GM/50ML IV SOLN
2.0000 g | Freq: Once | INTRAVENOUS | Status: AC
  Administered 2018-09-21: 2 g via INTRAVENOUS
  Filled 2018-09-21: qty 50

## 2018-09-21 MED ORDER — FAMOTIDINE 20 MG PO TABS
20.0000 mg | ORAL_TABLET | Freq: Two times a day (BID) | ORAL | Status: DC
  Administered 2018-09-21 – 2018-09-22 (×2): 20 mg
  Filled 2018-09-21 (×2): qty 1

## 2018-09-21 MED ORDER — INSULIN ASPART 100 UNIT/ML ~~LOC~~ SOLN
3.0000 [IU] | SUBCUTANEOUS | Status: DC
  Administered 2018-09-21 – 2018-09-29 (×32): 3 [IU] via SUBCUTANEOUS
  Filled 2018-09-21 (×33): qty 1

## 2018-09-21 MED ORDER — CHOLECALCIFEROL 10 MCG/ML (400 UNIT/ML) PO LIQD
1000.0000 [IU] | Freq: Every day | ORAL | Status: DC
  Administered 2018-09-22: 1000 [IU]
  Filled 2018-09-21 (×2): qty 2.5

## 2018-09-21 MED ORDER — ENOXAPARIN SODIUM 40 MG/0.4ML ~~LOC~~ SOLN
40.0000 mg | SUBCUTANEOUS | Status: DC
  Administered 2018-09-21 – 2018-09-28 (×8): 40 mg via SUBCUTANEOUS
  Filled 2018-09-21 (×8): qty 0.4

## 2018-09-21 MED ORDER — INSULIN GLARGINE 100 UNIT/ML ~~LOC~~ SOLN
4.0000 [IU] | Freq: Every day | SUBCUTANEOUS | Status: DC
  Administered 2018-09-21 – 2018-09-27 (×7): 4 [IU] via SUBCUTANEOUS
  Filled 2018-09-21 (×9): qty 0.04

## 2018-09-21 NOTE — Telephone Encounter (Signed)
This encounter was created in error - please disregard.

## 2018-09-21 NOTE — Progress Notes (Signed)
Inpatient Diabetes Program Recommendations  AACE/ADA: New Consensus Statement on Inpatient Glycemic Control   Target Ranges:  Prepandial:   less than 140 mg/dL      Peak postprandial:   less than 180 mg/dL (1-2 hours)      Critically ill patients:  140 - 180 mg/dL   Results for CORRION, STIREWALT (MRN [id number]) as of 09/21/2018 11:28  Ref. Range 09/20/2018 07:41 09/20/2018 11:29 09/20/2018 15:40 09/20/2018 19:46 09/20/2018 23:35 09/21/2018 03:45 09/21/2018 07:18  Glucose-Capillary Latest Ref Range: 70 - 99 mg/dL 89 103 (H) 132 (H) 222 (H) 239 (H) 178 (H) 256 (H)   Review of Glycemic Control  Diabetes history: DM2 Outpatient Diabetes medications: Lantus 12 units daily, Novolog 3 units Q4H Current orders for Inpatient glycemic control: Novolog 0-15 units Q4H; Vital @ 40 ml/hr  Inpatient Diabetes Program Recommendations:  Insulin - Basal: Please consider ordering Lantus 6 units Q24H (based on 59 kg x 0.1 units). Insulin - Tube Feeding Coverage: Please consider ordering Novolog 3 units Q4H for tube feeding coverage. If tube feeding is stopped or held then tube feeding coverage should also be stopped or held.  Thanks, Barnie Alderman, RN, MSN, CDE Diabetes Coordinator Inpatient Diabetes Program [phone] (Team Pager from 8am to 5pm)

## 2018-09-21 NOTE — Consult Note (Signed)
PULMONARY / CRITICAL CARE MEDICINE   Name: Maurice Patterson MRN: [id number] DOB: [birth year]    ADMISSION DATE:  09/19/2018 CONSULTATION DATE:  09/19/2018  REFERRING MD:  Dr. Jannifer Franklin  CHIEF COMPLAINT:  Altered Mental Status, Acute Respiratory Distress  BRIEF DISCUSSION: 63 y.o. Male admitted with Acute Hypoxic Respiratory Failure in setting of Aspiration, Sepsis, and Altered Mental Status.  He required intubation in the ED.    CC  resp failure  HPI Remains on vent Critically ill Prognosis is poor +aspiration pneumonia     SIGNIFICANT EVENTS 1/29 remains on vent 1/30 on Vent, assess neuro status  REVIEW OF SYSTEMS  PATIENT IS UNABLE TO PROVIDE COMPLETE REVIEW OF SYSTEM S DUE TO SEVERE CRITICAL ILLNESS AND ENCEPHALOPATHY  VITAL SIGNS: BP 109/71   Pulse 91   Temp 99.9 F (37.7 C)   Resp 16   Ht '5\' 6"'$  (1.676 m)   Wt 59 kg   SpO2 97%   BMI 20.99 kg/m   HEMODYNAMICS:    VENTILATOR SETTINGS: Vent Mode: PRVC FiO2 (%):  [40 %] 40 % Set Rate:  [16 bmp] 16 bmp Vt Set:  [450 mL] 450 mL PEEP:  [5 cmH20] 5 cmH20 Plateau Pressure:  [14 cmH20-19 cmH20] 19 cmH20  INTAKE / OUTPUT: I/O last 3 completed shifts: In: 2748.9 [I.V.:1756.5; Other:75; NG/GT:450.7; IV Piggyback:466.8] Out: 1100 [Urine:1100]  PHYSICAL EXAMINATION:  GENERAL:critically ill appearing, +resp distress HEAD: Normocephalic, atraumatic.  EYES: Pupils equal, round, reactive to light.  No scleral icterus.  MOUTH: Moist mucosal membrane. NECK: Supple. No thyromegaly. No nodules. No JVD.  PULMONARY: +rhonchi, +wheezing CARDIOVASCULAR: S1 and S2. Regular rate and rhythm. No murmurs, rubs, or gallops.  GASTROINTESTINAL: Soft, nontender, -distended. No masses. Positive bowel sounds. No hepatosplenomegaly.  MUSCULOSKELETAL: No swelling, clubbing, or edema.  NEUROLOGIC: obtunded SKIN:intact,warm,dry   LABS:  BMET Recent Labs  Lab 09/19/18 2003 09/20/18 0449 09/21/18 0507  NA 135 136 135  K 4.2  3.9 3.7  CL 100 106 106  CO2 '26 25 25  '$ BUN '14 12 13  '$ CREATININE 0.53* 0.50* 0.37*  GLUCOSE 202* 92 190*    Electrolytes Recent Labs  Lab 09/19/18 2003 09/20/18 0449 09/21/18 0507  CALCIUM 8.7* 7.8* 7.9*  MG  --   --  1.8  PHOS  --   --  2.7    CBC Recent Labs  Lab 09/19/18 2003 09/20/18 0449 09/21/18 0507  WBC 12.7* 9.4 8.7  HGB 14.2 12.5* 10.8*  HCT 42.7 39.8 33.3*  PLT 215 164 130*    Coag's No results for input(s): APTT, INR in the last 168 hours.  Sepsis Markers Recent Labs  Lab 09/19/18 2003 09/19/18 2306 09/20/18 0449 09/21/18 0507  LATICACIDVEN 2.6* 1.6  --   --   PROCALCITON  --  <0.10 0.17 0.14    ABG Recent Labs  Lab 09/19/18 2146 09/20/18 0425  PHART 7.39 7.40  PCO2ART 39 39  PO2ART 81* 83    Liver Enzymes Recent Labs  Lab 09/19/18 2003  AST 23  ALT 43  ALKPHOS 180*  BILITOT 0.7  ALBUMIN 3.6    Cardiac Enzymes Recent Labs  Lab 09/19/18 2003  TROPONINI <0.03    Glucose Recent Labs  Lab 09/20/18 1129 09/20/18 1540 09/20/18 1946 09/20/18 2335 09/21/18 0345 09/21/18 0718  GLUCAP 103* 132* 222* 239* 178* 256*    Imaging Dg Abd 1 View  Result Date: 09/20/2018 CLINICAL DATA:  Gastrostomy placement. EXAM: ABDOMEN - 1 VIEW COMPARISON:  Radiograph of September 11, 2018. FINDINGS: The bowel gas pattern is normal. Gastrostomy balloon tip appears to be within the gastric lumen. Contrast is noted within the gastric lumen and proximal duodenum. No radio-opaque calculi or other significant radiographic abnormality are seen. IMPRESSION: Gastrostomy tube appears to be well positioned within gastric lumen. Electronically Signed   By: Marijo Conception, M.D.   On: 09/20/2018 13:39   Dg Chest Port 1 View  Result Date: 09/21/2018 CLINICAL DATA:  Acute respiratory failure EXAM: PORTABLE CHEST 1 VIEW COMPARISON:  Yesterday FINDINGS: Endotracheal tube tip at the clavicular heads. Low volume chest with interstitial and airspace opacity at the  bases. Normal heart size. Coronary stent. No evident effusion or pneumothorax. IMPRESSION: 1. Stable endotracheal tube positioning. 2. Stable low volume chest with atelectasis or infection at the bases. Electronically Signed   By: Monte Fantasia M.D.   On: 09/21/2018 06:32     STUDIES:  CT Head wo Contrast 1/28>> No acute intracranial process.  Stable moderate parenchymal brain volume loss, advanced for age.  Old small RIGHT cerebellar infarct and mild chronic small vessel ischemic changes. KUB 1/28>> Gastrostomy tube tip projects over the left upper quadrant. No free air, organomegaly or evidence of bowel obstruction. Moderate stool in the colon.  CULTURES: Blood x2 09/19/18>> Urine 09/19/18>> Tracheal Aspirate 09/19/18>> Strep Pneumo urinary antigen 1/28>> Legionella urinary antigen 1/28>>  ANTIBIOTICS: Clindamycin 1/28>> Cefepime 1/28>>1/29 Vancomycin 1/28 x1 dose >>(discontinued due to concerns of Redman's syndrome)  SIGNIFICANT EVENTS: 09/19/18>> Intubated in ED. Admission to [facility] ICU  LINES/TUBES: ETT 1/28>> PEG Tube 08/28/18>>assessed by GI-good to go  ASSESSMENT / PLAN:  Admitted for acute resp failure from acute aspiration of gastric contents    Severe Hypoxic and Hypercapnic Respiratory Failure -continue Mechanical Ventilator support -continue Bronchodilator Therapy -Wean Fio2 and PEEP as tolerated -VAP/VENT bundle implementation -will perform SAT/SBT when respiratory parameters are met   INFECTIOUS DISEASE-aspiration pneumopnia -continue antibiotics as prescribed -follow up cultures   CARDIAC -continue ICU monitoring   GI/Nutrition GI PROPHYLAXIS as indicated DIET-->TF's as tolerated through PEG tube Constipation protocol as indicated   NEUROLOGY - intubated and sedated - minimal sedation to achieve a RASS goal: -1  ELECTROLYTES -follow labs as needed -replace as needed -pharmacy consultation and following     DVT/GI PRX ordered TRANSFUSIONS  AS NEEDED MONITOR FSBS ASSESS the need for LABS as needed    Critical Care Time devoted to patient care services described in this note is 37 minutes.   Overall, patient is critically ill, prognosis is guarded.  Patient with Multiorgan failure and at high risk for cardiac arrest and death.    Corrin Parker, M.D.  [facility]  Medical Director [place] Director [facility] Cardio-Pulmonary Department

## 2018-09-21 NOTE — Progress Notes (Signed)
[facility] NAME: Maurice Patterson    MR#:  [id number]  DATE OF BIRTH:  07-04-1956  SUBJECTIVE:  CHIEF COMPLAINT: Patient is intubated and sedated.  Weaning trials today No family members at bedside  REVIEW OF SYSTEMS:  Review of system unobtainable  DRUG ALLERGIES:   Allergies  Allergen Reactions  . Hydrocodone Anaphylaxis  . Morphine Other (See Comments)    Loss of memory  . Ambien [Zolpidem] Other (See Comments)    delirium    . Brilinta [Ticagrelor] Other (See Comments)    Stroke   . Flexeril [Cyclobenzaprine] Other (See Comments)    delerium   . Flunitrazepam Other (See Comments)    ROHYPNOL (hallucinations)  . Haldol [Haloperidol Lactate] Other (See Comments)    delerium   . Levetiracetam Diarrhea and Other (See Comments)    Unable to walk  . Lorazepam Hives  . Risperdal [Risperidone] Other (See Comments)    Delirium   . Trazodone Other (See Comments)    Delirium Can take in low doses   . Benadryl [Diphenhydramine Hcl (Sleep)] Rash  . Penicillins Rash    Mouth ulcers Has patient had a PCN reaction causing immediate rash, facial/tongue/throat swelling, SOB or lightheadedness with hypotension: Yes Has patient had a PCN reaction causing severe rash involving mucus membranes or skin necrosis: No Has patient had a PCN reaction that required hospitalization No Has patient had a PCN reaction occurring within the last 10 years: Yes If all of the above answers are "NO", then may proceed with Cephalosporin use.  . Vancomycin Rash    Rash around IV site during vancomycin infusion    VITALS:  Blood pressure 93/67, pulse 88, temperature 98.2 F (36.8 C), resp. rate 11, height 5\' 6"  (1.676 m), weight 59 kg, SpO2 98 %.  PHYSICAL EXAMINATION:  GENERAL:  63 y.o.-year-old patient lying in the bed with no acute distress.  EYES: Pupils equal, round, reactive to light and accommodation. No scleral icterus.  Extraocular muscles intact.  HEENT: Head atraumatic, normocephalic.  ET tube intact NECK:  Supple, no jugular venous distention. No thyroid enlargement, no tenderness.  LUNGS: Moderate breath sounds bilaterally, no wheezing, rales,rhonchi or crepitation. No use of accessory muscles of respiration.  CARDIOVASCULAR: S1, S2 normal. No murmurs, rubs, or gallops.  ABDOMEN: Soft, nontender, nondistended. Bowel sounds present. EXTREMITIES: No pedal edema, cyanosis, or clubbing.  NEUROLOGIC: Patient is sedated. Gait not checked.  PSYCHIATRIC: The patient is sedated on vent  sKIN: No obvious rash, lesion, or ulcer.    LABORATORY PANEL:   CBC Recent Labs  Lab 09/21/18 0507  WBC 8.7  HGB 10.8*  HCT 33.3*  PLT 130*   ------------------------------------------------------------------------------------------------------------------  Chemistries  Recent Labs  Lab 09/19/18 2003  09/21/18 0507  NA 135   < > 135  K 4.2   < > 3.7  CL 100   < > 106  CO2 26   < > 25  GLUCOSE 202*   < > 190*  BUN 14   < > 13  CREATININE 0.53*   < > 0.37*  CALCIUM 8.7*   < > 7.9*  MG  --   --  1.8  AST 23  --   --   ALT 43  --   --   ALKPHOS 180*  --   --   BILITOT 0.7  --   --    < > = values in this interval not displayed.   ------------------------------------------------------------------------------------------------------------------  Cardiac Enzymes Recent Labs  Lab 09/19/18 2003  TROPONINI <0.03   ------------------------------------------------------------------------------------------------------------------  RADIOLOGY:  Dg Chest 1 View  Result Date: 09/19/2018 CLINICAL DATA:  Status post intubation. EXAM: CHEST  1 VIEW COMPARISON:  09/19/2018 at 1954 hours FINDINGS: New endotracheal tube tip projects 1.6 cm above the Carina. No change in the appearance of the lungs from the recent prior exam. No new lung abnormalities. IMPRESSION: Well-positioned endotracheal tube. No other change from  the prior study. Electronically Signed   By: Lajean Manes M.D.   On: 09/19/2018 21:00   Dg Abd 1 View  Result Date: 09/20/2018 CLINICAL DATA:  Gastrostomy placement. EXAM: ABDOMEN - 1 VIEW COMPARISON:  Radiograph of September 11, 2018. FINDINGS: The bowel gas pattern is normal. Gastrostomy balloon tip appears to be within the gastric lumen. Contrast is noted within the gastric lumen and proximal duodenum. No radio-opaque calculi or other significant radiographic abnormality are seen. IMPRESSION: Gastrostomy tube appears to be well positioned within gastric lumen. Electronically Signed   By: Marijo Conception, M.D.   On: 09/20/2018 13:39   Dg Abd 1 View  Result Date: 09/19/2018 CLINICAL DATA:  Peg tube status EXAM: ABDOMEN - 1 VIEW COMPARISON:  09/09/2018 FINDINGS: Gastrostomy tube tip projects over the left upper quadrant. No free air, organomegaly or evidence of bowel obstruction. Moderate stool in the colon. IMPRESSION: Gastrostomy tube tip projects over the left upper quadrant. Moderate stool burden in the colon. Electronically Signed   By: Rolm Baptise M.D.   On: 09/19/2018 23:49   Ct Head Wo Contrast  Result Date: 09/19/2018 CLINICAL DATA:  Altered mental status, possible aspiration. Found down at rehab. History of stroke, dementia, hypertension, hyperlipidemia, pancreatic cancer. EXAM: CT HEAD WITHOUT CONTRAST TECHNIQUE: Contiguous axial images were obtained from the base of the skull through the vertex without intravenous contrast. COMPARISON:  CT HEAD August 25, 2018 FINDINGS: BRAIN: No intraparenchymal hemorrhage, mass effect nor midline shift. Moderate global parenchymal brain volume loss. No hydrocephalus. No hydrocephalus. Patchy supratentorial white matter hypodensities. Old small RIGHT cerebellar infarcts. No acute large vascular territory infarcts. No abnormal extra-axial fluid collections. Basal cisterns are patent. VASCULAR: Moderate calcific atherosclerosis of the carotid siphons. SKULL:  No skull fracture. No significant scalp soft tissue swelling. SINUSES/ORBITS: Mild paranasal sinus mucosal thickening. Mastoid air cells are well aerated.The included ocular globes and orbital contents are non-suspicious. OTHER: Patient is edentulous. IMPRESSION: 1. No acute intracranial process. 2. Stable moderate parenchymal brain volume loss, advanced for age. 3. Old small RIGHT cerebellar infarct and mild chronic small vessel ischemic changes. Electronically Signed   By: Elon Alas M.D.   On: 09/19/2018 22:54   Dg Chest Port 1 View  Result Date: 09/21/2018 CLINICAL DATA:  Acute respiratory failure EXAM: PORTABLE CHEST 1 VIEW COMPARISON:  Yesterday FINDINGS: Endotracheal tube tip at the clavicular heads. Low volume chest with interstitial and airspace opacity at the bases. Normal heart size. Coronary stent. No evident effusion or pneumothorax. IMPRESSION: 1. Stable endotracheal tube positioning. 2. Stable low volume chest with atelectasis or infection at the bases. Electronically Signed   By: Monte Fantasia M.D.   On: 09/21/2018 06:32   Portable Chest Xray  Result Date: 09/20/2018 CLINICAL DATA:  Acute respiratory failure. EXAM: PORTABLE CHEST 1 VIEW COMPARISON:  09/19/2018 and 08/30/2018 FINDINGS: Endotracheal tube in good position 4.6 cm above the carina. The heart size and pulmonary vascularity are normal. There is chronic accentuation of the interstitial markings with new slight atelectasis at the right  lung base. No acute bone abnormality. Multiple old healed right posterior rib fractures. IMPRESSION: New slight atelectasis at the left lung base.  No other change. Electronically Signed   By: Lorriane Shire M.D.   On: 09/20/2018 07:11   Dg Chest Port 1 View  Result Date: 09/19/2018 CLINICAL DATA:  Possible aspiration EXAM: PORTABLE CHEST 1 VIEW COMPARISON:  08/30/2018, 08/29/2018, 08/25/2018, 08/17/2018, 12/12/2017 FINDINGS: Mild bibasilar airspace disease, slightly improved since  08/30/2018. No new opacity or pleural effusion. Stable cardiomediastinal silhouette. No pneumothorax. Old right-sided rib fractures. IMPRESSION: Slightly improved airspace disease at both lung bases Electronically Signed   By: Donavan Foil M.D.   On: 09/19/2018 20:10    EKG:   Orders placed or performed during the hospital encounter of 09/19/18  . EKG 12-Lead  . EKG 12-Lead  . ED EKG 12-Lead  . ED EKG 12-Lead    ASSESSMENT AND PLAN:   #Acute respiratory failure secondary to aspiration pneumonia Got intubated on vent, weaning trials today per pulmonology Vent management per protocol Follow-up with intensivist -Tube feeds through PEG tube with aspiration precautions  #Sepsis due to aspiration pneumonia IV antibiotics cefepime, clindamycin changed to IV Unasyn  IV fluids  #Diabetes mellitus sliding scale insulin  #GERD Pepcid  #Essential hypertension-blood pressure is stable at this time continue close monitoring  #Severe malnutrition on nutritional supplements like ~1.5 cal and pro-stat  All the records are reviewed and case discussed with Care Management/Social Workerr. Management plans discussed with the patient sister at bedside. CODE STATUS: fc  TOTAL TIME TAKING CARE OF THIS PATIENT: 36  minutes.   POSSIBLE D/C IN ? DAYS, DEPENDING ON CLINICAL CONDITION.  Note: This dictation was prepared with Dragon dictation along with smaller phrase technology. Any transcriptional errors that result from this process are unintentional.   [name] M.D on 09/21/2018 at 2:58 PM  Between 7am to 6pm - Pager - [phone] After 6pm go to [email or web address] - password [email or web address] [facility]  [facility]  Office  [phone]  CC: Primary care physician; [name], MD

## 2018-09-21 NOTE — Progress Notes (Signed)
PHARMACIST - PHYSICIAN COMMUNICATION  CONCERNING: IV to Oral Route Change Policy  RECOMMENDATION: This patient is receiving famotidine by the intravenous route.  Based on criteria approved by the Pharmacy and Therapeutics Committee, the intravenous medication(s) is/are being converted to the equivalent oral dose form(s).   DESCRIPTION: These criteria include:  The patient is eating (either orally or via tube) and/or has been taking other orally administered medications for a least 24 hours  The patient has no evidence of active gastrointestinal bleeding or impaired GI absorption (gastrectomy, short bowel, patient on TNA or NPO).  If you have questions about this conversion, please contact the Pharmacy Department   []   [phone] )  [place], [facility] 09/21/2018 9:13 AM

## 2018-09-22 ENCOUNTER — Inpatient Hospital Stay: Payer: PPO

## 2018-09-22 LAB — BASIC METABOLIC PANEL
Anion gap: 5 (ref 5–15)
BUN: 14 mg/dL (ref 8–23)
CO2: 29 mmol/L (ref 22–32)
Calcium: 8 mg/dL — ABNORMAL LOW (ref 8.9–10.3)
Chloride: 103 mmol/L (ref 98–111)
Creatinine, Ser: 0.42 mg/dL — ABNORMAL LOW (ref 0.61–1.24)
GFR calc non Af Amer: >60 mL/min (ref >60)
Glucose, Bld: 208 mg/dL — ABNORMAL HIGH (ref 70–99)
Potassium: 3.6 mmol/L (ref 3.5–5.1)
Sodium: 137 mmol/L (ref 135–145)

## 2018-09-22 LAB — BLOOD GAS, ARTERIAL
Acid-Base Excess: 5.1 mmol/L — ABNORMAL HIGH (ref 0.0–2.0)
Allens test (pass/fail): POSITIVE — AB
BICARBONATE: 30.5 mmol/L — AB (ref 20.0–28.0)
FIO2: 30
MODE: POSITIVE
O2 Saturation: 96.8 %
PEEP: 5 cmH2O
PO2 ART: 87 mmHg (ref 83.0–108.0)
Patient temperature: 37
Pressure support: 5 cmH2O
pCO2 arterial: 47 mmHg (ref 32.0–48.0)
pH, Arterial: 7.42 (ref 7.350–7.450)

## 2018-09-22 LAB — CBC
HCT: 31.4 % — ABNORMAL LOW (ref 39.0–52.0)
Hemoglobin: 10 g/dL — ABNORMAL LOW (ref 13.0–17.0)
MCH: 30.6 pg (ref 26.0–34.0)
MCHC: 31.8 g/dL (ref 30.0–36.0)
MCV: 96 fL (ref 80.0–100.0)
Platelets: 139 10*3/uL — ABNORMAL LOW (ref 150–400)
RBC: 3.27 MIL/uL — ABNORMAL LOW (ref 4.22–5.81)
RDW: 16.4 % — ABNORMAL HIGH (ref 11.5–15.5)
WBC: 7.3 10*3/uL (ref 4.0–10.5)
nRBC: 0 % (ref 0.0–0.2)

## 2018-09-22 LAB — GLUCOSE, CAPILLARY
Glucose-Capillary: 109 mg/dL — ABNORMAL HIGH (ref 70–99)
Glucose-Capillary: 131 mg/dL — ABNORMAL HIGH (ref 70–99)
Glucose-Capillary: 150 mg/dL — ABNORMAL HIGH (ref 70–99)
Glucose-Capillary: 156 mg/dL — ABNORMAL HIGH (ref 70–99)
Glucose-Capillary: 205 mg/dL — ABNORMAL HIGH (ref 70–99)
Glucose-Capillary: 89 mg/dL (ref 70–99)

## 2018-09-22 LAB — CULTURE, RESPIRATORY W GRAM STAIN: Special Requests: NORMAL

## 2018-09-22 LAB — MAGNESIUM: MAGNESIUM: 2.1 mg/dL (ref 1.7–2.4)

## 2018-09-22 MED ORDER — ADULT MULTIVITAMIN W/MINERALS CH
1.0000 | ORAL_TABLET | Freq: Every day | ORAL | Status: DC
  Administered 2018-09-23: 1 via ORAL
  Filled 2018-09-22: qty 1

## 2018-09-22 MED ORDER — FAMOTIDINE IN NACL 20-0.9 MG/50ML-% IV SOLN
20.0000 mg | INTRAVENOUS | Status: DC
  Administered 2018-09-22 – 2018-09-24 (×3): 20 mg via INTRAVENOUS
  Filled 2018-09-22 (×3): qty 50

## 2018-09-22 MED ORDER — CLOPIDOGREL BISULFATE 75 MG PO TABS
75.0000 mg | ORAL_TABLET | Freq: Every day | ORAL | Status: DC
  Administered 2018-09-23 – 2018-09-24 (×2): 75 mg via ORAL
  Filled 2018-09-22 (×2): qty 1

## 2018-09-22 MED ORDER — PHENOL 1.4 % MT LIQD
2.0000 | OROMUCOSAL | Status: DC | PRN
  Filled 2018-09-22: qty 177

## 2018-09-22 MED ORDER — MEMANTINE HCL 10 MG PO TABS
10.0000 mg | ORAL_TABLET | Freq: Every day | ORAL | Status: DC
  Administered 2018-09-23 – 2018-09-24 (×2): 10 mg via ORAL
  Filled 2018-09-22 (×3): qty 1

## 2018-09-22 MED ORDER — DONEPEZIL HCL 5 MG PO TABS
10.0000 mg | ORAL_TABLET | Freq: Every day | ORAL | Status: DC
  Administered 2018-09-23 – 2018-09-24 (×2): 10 mg via ORAL
  Filled 2018-09-22 (×4): qty 2

## 2018-09-22 MED ORDER — ASPIRIN 81 MG PO CHEW
81.0000 mg | CHEWABLE_TABLET | Freq: Every day | ORAL | Status: DC
  Administered 2018-09-23 – 2018-09-24 (×2): 81 mg via ORAL
  Filled 2018-09-22 (×2): qty 1

## 2018-09-22 MED ORDER — POTASSIUM CHLORIDE 20 MEQ PO PACK
40.0000 meq | PACK | Freq: Once | ORAL | Status: AC
  Administered 2018-09-22: 40 meq
  Filled 2018-09-22: qty 2

## 2018-09-22 MED ORDER — CHOLECALCIFEROL 10 MCG (400 UNIT) PO TABS
1000.0000 [IU] | ORAL_TABLET | Freq: Every day | ORAL | Status: DC
  Administered 2018-09-23 – 2018-09-24 (×2): 1000 [IU] via ORAL
  Filled 2018-09-22 (×3): qty 3

## 2018-09-22 MED ORDER — SODIUM CHLORIDE 0.9 % IV SOLN
3.0000 g | Freq: Four times a day (QID) | INTRAVENOUS | Status: DC
  Administered 2018-09-22 – 2018-09-28 (×24): 3 g via INTRAVENOUS
  Filled 2018-09-22 (×29): qty 3

## 2018-09-22 NOTE — Care Management (Signed)
Attending relays that palliative has been involved with patient on previous admission and family did not have a good experience so would like to hold on the palliative consult.

## 2018-09-22 NOTE — Care Management (Signed)
Extubated.  Patient will require prior approval to return to skilled nursing facility by insurance. Reached out to attending for order for physical therapy.

## 2018-09-22 NOTE — Progress Notes (Signed)
Follow up - Critical Care Medicine Note  Patient Details:    Maurice Patterson is an 63 y.o. male. admitted with Acute Hypoxic Respiratory Failure in setting of Aspiration, Sepsis, and Altered Mental Status.  He required intubation in the ED  Lines, Airways, Drains: Airway 7.5 mm (Active)  Secured at (cm) 22 cm 09/22/2018  7:00 AM  Measured From Lips 09/22/2018  7:00 AM  Secured Location Right 09/22/2018  3:32 AM  Secured By [employer] 09/22/2018  3:32 AM  Tube Holder Repositioned Yes 09/22/2018  3:32 AM  Cuff Pressure (cm H2O) 25 cm H2O 09/22/2018  3:32 AM  Site Condition Dry 09/22/2018  7:00 AM     Gastrostomy/Enterostomy Percutaneous endoscopic gastrostomy (PEG) LUQ (Active)  Surrounding Skin Dry 09/22/2018  7:00 AM  Tube Status Patent 09/22/2018  7:00 AM  Dressing Status Clean;Dry;Intact 09/22/2018  7:00 AM  Dressing Intervention Dressing changed 09/21/2018 12:00 PM  Dressing Type Split gauze 09/22/2018  7:00 AM  G Port Intake (mL) 110 ml 09/21/2018  4:00 PM     Urethral Catheter ED RN (Active)  Indication for Insertion or Continuance of Catheter Unstable spinal/crush injuries 09/22/2018  7:00 AM  Site Assessment Clean;Intact 09/22/2018  7:00 AM  Catheter Maintenance Bag below level of bladder;Catheter secured;Drainage bag/tubing not touching floor;Seal intact;Bag emptied prior to transport;No dependent loops;Insertion date on drainage bag 09/22/2018  8:00 AM  Collection Container Standard drainage bag 09/22/2018  7:00 AM  Securement Method Securing device (Describe) 09/22/2018  7:00 AM  Urinary Catheter Interventions Unclamped 09/22/2018  7:00 AM  Output (mL) 60 mL 09/22/2018  6:00 AM    Anti-infectives:  Anti-infectives (From admission, onward)   Start     Dose/Rate Route Frequency Ordered Stop   09/21/18 1400  clindamycin (CLEOCIN) IVPB 600 mg     600 mg 100 mL/hr over 30 Minutes Intravenous Every 8 hours 09/21/18 1158     09/20/18 0800  vancomycin (VANCOCIN) IVPB 750 mg/150 ml  premix  Status:  Discontinued     750 mg 150 mL/hr over 60 Minutes Intravenous Every 12 hours 09/19/18 2019 09/20/18 1005   09/20/18 0400  ceFEPIme (MAXIPIME) 2 g in sodium chloride 0.9 % 100 mL IVPB  Status:  Discontinued     2 g 200 mL/hr over 30 Minutes Intravenous Every 8 hours 09/19/18 2012 09/20/18 1005   09/20/18 0300  clindamycin (CLEOCIN) IVPB 300 mg  Status:  Discontinued     300 mg 100 mL/hr over 30 Minutes Intravenous Every 8 hours 09/19/18 2311 09/21/18 1015   09/19/18 2115  clindamycin (CLEOCIN) IVPB 900 mg     900 mg 100 mL/hr over 30 Minutes Intravenous  Once 09/19/18 2101 09/19/18 2235   09/19/18 2000  vancomycin (VANCOCIN) IVPB 1000 mg/200 mL premix  Status:  Discontinued     1,000 mg 200 mL/hr over 60 Minutes Intravenous  Once 09/19/18 1953 09/19/18 2044   09/19/18 2000  ceFEPIme (MAXIPIME) 2 g in sodium chloride 0.9 % 100 mL IVPB     2 g 200 mL/hr over 30 Minutes Intravenous  Once 09/19/18 1953 09/19/18 2100      Microbiology: Results for orders placed or performed during the hospital encounter of 09/19/18  Culture, respiratory (non-expectorated)     Status: None (Preliminary result)   Collection Time: 09/19/18  1:14 AM  Result Value Ref Range Status   Specimen Description   Final    TRACHEAL ASPIRATE Performed at [facility], [address] [place], [place]  [address]    Special Requests   Final    Normal Performed at [address] [place], [place] [address]    Gram Stain   Final    FEW WBC PRESENT, PREDOMINANTLY PMN FEW GRAM POSITIVE COCCI IN PAIRS IN CLUSTERS RARE GRAM POSITIVE RODS RARE GRAM NEGATIVE RODS    Culture   Final    FEW STAPHYLOCOCCUS AUREUS SUSCEPTIBILITIES TO FOLLOW Performed at [facility], [place] [address], [place], [place] [address]    Report Status PENDING  Incomplete  Blood Culture (routine x 2)     Status: None (Preliminary result)   Collection Time: 09/19/18  8:03 PM  Result Value Ref  Range Status   Specimen Description BLOOD BLOOD RIGHT FOREARM  Final   Special Requests   Final    BOTTLES DRAWN AEROBIC AND ANAEROBIC Blood Culture results may not be optimal due to an excessive volume of blood received in culture bottles   Culture   Final    NO GROWTH 3 DAYS Performed at [facility], [address] [place], [place] [address]    Report Status PENDING  Incomplete  Blood Culture (routine x 2)     Status: None (Preliminary result)   Collection Time: 09/19/18  8:03 PM  Result Value Ref Range Status   Specimen Description BLOOD LEFT ANTECUBITAL  Final   Special Requests   Final    BOTTLES DRAWN AEROBIC AND ANAEROBIC Blood Culture adequate volume   Culture   Final    NO GROWTH 3 DAYS Performed at [facility], [address], [place], [place] [address]    Report Status PENDING  Incomplete  Urine culture     Status: None   Collection Time: 09/19/18  8:03 PM  Result Value Ref Range Status   Specimen Description   Final    URINE, RANDOM Performed at [facility] [address] [place], [place] [address]    Special Requests   Final    NONE Performed at [facility], [address], [place], [place] [address]    Culture   Final    NO GROWTH Performed at [facility], [place] [address] [place], [place] [address]    Report Status 09/21/2018 FINAL  Final  MRSA PCR Screening     Status: None   Collection Time: 09/19/18 10:52 PM  Result Value Ref Range Status   MRSA by PCR NEGATIVE NEGATIVE Final    Comment:        The GeneXpert MRSA Assay (FDA approved for NASAL specimens only), is one component of a comprehensive MRSA colonization surveillance program. It is not intended to diagnose MRSA infection nor to guide or monitor treatment for MRSA infections. Performed at [facility], [address], [place], [place] [address]     Best Practice/Protocols:   Lovenox/Pepcid   Events:   Studies: Dg Chest  1 View  Result Date: 09/19/2018 CLINICAL DATA:  Status post intubation. EXAM: CHEST  1 VIEW COMPARISON:  09/19/2018 at 1954 hours FINDINGS: New endotracheal tube tip projects 1.6 cm above the Carina. No change in the appearance of the lungs from the recent prior exam. No new lung abnormalities. IMPRESSION: Well-positioned endotracheal tube. No other change from the prior study. Electronically Signed   By: Lajean Manes M.D.   On: 09/19/2018 21:00   Dg Chest 2 View  Result Date: 08/29/2018 CLINICAL DATA:  Aspiration pneumonia. EXAM: CHEST - 2 VIEW COMPARISON:  Chest and abdominal radiographs 08/25/2018 FINDINGS: Unchanged heart size and mediastinal contours. Coronary stent. Slightly improved aeration from prior exam. No significant change in streaky bibasilar opacities. Bronchial thickening is also unchanged. No new airspace disease. There are small bilateral pleural effusions. No pneumothorax. Remote right rib fractures. IMPRESSION: 1. Slightly improved aeration from prior exam. Unchanged streaky bibasilar opacities that may be atelectasis or seen in the setting of aspiration. 2. Small pleural effusions. Electronically Signed   By: Keith Rake M.D.   On: 08/29/2018 21:35   Dg Abd 1 View  Result Date: 09/20/2018 CLINICAL DATA:  Gastrostomy placement. EXAM: ABDOMEN - 1 VIEW COMPARISON:  Radiograph of September 11, 2018. FINDINGS: The bowel gas pattern is normal. Gastrostomy balloon tip appears to be within the gastric lumen. Contrast is noted within the gastric lumen and proximal duodenum. No radio-opaque calculi or other significant radiographic abnormality are seen. IMPRESSION: Gastrostomy tube appears to be well positioned within gastric lumen. Electronically Signed   By: Marijo Conception, M.D.   On: 09/20/2018 13:39   Dg Abd 1 View  Result Date: 09/19/2018 CLINICAL DATA:  Peg tube status EXAM: ABDOMEN - 1 VIEW COMPARISON:  09/09/2018 FINDINGS: Gastrostomy tube tip projects over the left upper  quadrant. No free air, organomegaly or evidence of bowel obstruction. Moderate stool in the colon. IMPRESSION: Gastrostomy tube tip projects over the left upper quadrant. Moderate stool burden in the colon. Electronically Signed   By: Rolm Baptise M.D.   On: 09/19/2018 23:49   Dg Abdomen 1 View  Result Date: 09/09/2018 CLINICAL DATA:  Encounter for evaluation for G-tube placement. EXAM: ABDOMEN - 1 VIEW 50 mL Isovue-300 administered through G-tube. COMPARISON:  None. FINDINGS: Contrast is identified in the G-tube and in the stomach without evidence of leak. IMPRESSION: Contrast is identified in the GB tube and in stomach without evidence of leak. Electronically Signed   By: Abelardo Diesel M.D.   On: 09/09/2018 14:47   Dg Abd 1 View  Result Date: 08/25/2018 CLINICAL DATA:  NG tube placement. EXAM: ABDOMEN - 1 VIEW COMPARISON:  08/17/2018 FINDINGS: The feeding tube has been removed. A small caliber enteric tube has been placed and terminates over the proximal stomach with side hole below the diaphragm. Gas and a moderate amount of stool are present in the colon. No dilated loops of bowel are seen to suggest obstruction. The lungs were more fully evaluated on today's earlier chest radiograph. IMPRESSION: Enteric tube terminates over the proximal stomach. Electronically Signed   By: Logan Bores M.D.   On: 08/25/2018 17:25   Ct Head Wo Contrast  Result Date: 09/19/2018 CLINICAL DATA:  Altered mental status, possible aspiration. Found down at rehab. History of stroke, dementia, hypertension, hyperlipidemia, pancreatic cancer. EXAM: CT HEAD WITHOUT CONTRAST TECHNIQUE: Contiguous axial images were obtained from the base of the skull through the vertex without intravenous contrast. COMPARISON:  CT HEAD August 25, 2018 FINDINGS: BRAIN: No intraparenchymal hemorrhage, mass effect nor midline shift. Moderate global parenchymal brain volume loss. No hydrocephalus. No hydrocephalus. Patchy supratentorial white matter  hypodensities. Old small RIGHT cerebellar infarcts. No acute large vascular territory infarcts. No abnormal extra-axial fluid collections. Basal cisterns are patent. VASCULAR: Moderate calcific atherosclerosis of the carotid siphons. SKULL: No skull fracture. No significant scalp soft tissue swelling. SINUSES/ORBITS: Mild paranasal sinus mucosal thickening. Mastoid air cells are well aerated.The included ocular globes and orbital contents are non-suspicious. OTHER: Patient is edentulous. IMPRESSION: 1. No acute intracranial process. 2. Stable moderate parenchymal brain  volume loss, advanced for age. 3. Old small RIGHT cerebellar infarct and mild chronic small vessel ischemic changes. Electronically Signed   By: Elon Alas M.D.   On: 09/19/2018 22:54   Ct Head Wo Contrast  Result Date: 08/25/2018 CLINICAL DATA:  Altered mental status (AMS), unclear cause EXAM: CT HEAD WITHOUT CONTRAST TECHNIQUE: Contiguous axial images were obtained from the base of the skull through the vertex without intravenous contrast. COMPARISON:  Head CT 08/08/2018 FINDINGS: Brain: Unchanged atrophy and chronic small vessel ischemia. No intracranial hemorrhage, mass effect, or midline shift. No hydrocephalus. The basilar cisterns are patent. No evidence of territorial infarct or acute ischemia. No extra-axial or intracranial fluid collection. Vascular: Atherosclerosis of skullbase vasculature without hyperdense vessel or abnormal calcification. Skull: No fracture or focal lesion. Sinuses/Orbits: Paranasal sinuses and mastoid air cells are clear. The visualized orbits are unremarkable. Other: None. IMPRESSION: 1. No acute intracranial abnormality. 2. Unchanged atrophy and chronic small vessel ischemia. Electronically Signed   By: Keith Rake M.D.   On: 08/25/2018 02:17   Dg Chest Port 1 View  Result Date: 09/22/2018 CLINICAL DATA:  Acute respiratory failure. EXAM: PORTABLE CHEST 1 VIEW COMPARISON:  09/21/2018. FINDINGS:  Endotracheal tube in stable position. Heart size normal. Coronary artery calcification. Persistent bibasilar atelectasis/infiltrates. Tiny bilateral scratched it tiny right pleural effusion. Chest is stable from prior exam. No pneumothorax. Old right rib fractures again noted. IMPRESSION: 1.  Endotracheal tube in stable position. 2. Persistent bibasilar atelectasis/infiltrates. Small right pleural effusion. Similar findings noted on prior exam. 3.  Coronary artery disease.  Heart size normal. Electronically Signed   By: Marcello Moores  Register   On: 09/22/2018 05:34   Dg Chest Port 1 View  Result Date: 09/21/2018 CLINICAL DATA:  Acute respiratory failure EXAM: PORTABLE CHEST 1 VIEW COMPARISON:  Yesterday FINDINGS: Endotracheal tube tip at the clavicular heads. Low volume chest with interstitial and airspace opacity at the bases. Normal heart size. Coronary stent. No evident effusion or pneumothorax. IMPRESSION: 1. Stable endotracheal tube positioning. 2. Stable low volume chest with atelectasis or infection at the bases. Electronically Signed   By: Monte Fantasia M.D.   On: 09/21/2018 06:32   Portable Chest Xray  Result Date: 09/20/2018 CLINICAL DATA:  Acute respiratory failure. EXAM: PORTABLE CHEST 1 VIEW COMPARISON:  09/19/2018 and 08/30/2018 FINDINGS: Endotracheal tube in good position 4.6 cm above the carina. The heart size and pulmonary vascularity are normal. There is chronic accentuation of the interstitial markings with new slight atelectasis at the right lung base. No acute bone abnormality. Multiple old healed right posterior rib fractures. IMPRESSION: New slight atelectasis at the left lung base.  No other change. Electronically Signed   By: Lorriane Shire M.D.   On: 09/20/2018 07:11   Dg Chest Port 1 View  Result Date: 09/19/2018 CLINICAL DATA:  Possible aspiration EXAM: PORTABLE CHEST 1 VIEW COMPARISON:  08/30/2018, 08/29/2018, 08/25/2018, 08/17/2018, 12/12/2017 FINDINGS: Mild bibasilar airspace  disease, slightly improved since 08/30/2018. No new opacity or pleural effusion. Stable cardiomediastinal silhouette. No pneumothorax. Old right-sided rib fractures. IMPRESSION: Slightly improved airspace disease at both lung bases Electronically Signed   By: Donavan Foil M.D.   On: 09/19/2018 20:10   Dg Chest Port 1 View  Result Date: 08/30/2018 CLINICAL DATA:  Hypoxia EXAM: PORTABLE CHEST 1 VIEW COMPARISON:  Yesterday FINDINGS: Normal heart size and mediastinal contours. Coronary stent. Stable interstitial coarsening and bandlike opacity at the bases. There is no edema, effusion, or pneumothorax. Remote right rib fractures. IMPRESSION: Unchanged  atelectasis or bronchopneumonia. Electronically Signed   By: Monte Fantasia M.D.   On: 08/30/2018 05:37   Dg Chest Port 1 View  Result Date: 08/25/2018 CLINICAL DATA:  Assess for aspiration. EXAM: PORTABLE CHEST 1 VIEW COMPARISON:  Chest radiograph performed 08/17/2018 FINDINGS: The lungs are well-aerated. Minimal bibasilar opacities may reflect atelectasis or possibly mild pneumonia; given clinical concern, aspiration cannot be entirely excluded. Peribronchial thickening is noted. There is no evidence of pleural effusion or pneumothorax. The cardiomediastinal silhouette is within normal limits. No acute osseous abnormalities are seen. A large chronic sclerotic lesion is noted at the left mid humerus. IMPRESSION: Minimal bibasilar opacities may reflect atelectasis or possibly mild pneumonia; given clinical concern, aspiration cannot be entirely excluded. Peribronchial thickening noted. Electronically Signed   By: Garald Balding M.D.   On: 08/25/2018 00:48   Dg Replc Gastro/colonic Tube Percut W/fluoro  Result Date: 09/04/2018 INDICATION: Patient has inter vertically pulled out a bumper retention gastrostomy tube placed endoscopically on 08/28/2018. Request has been made to try to replace the gastrostomy through the exit site. EXAM: GASTROSTOMY TUBE REPLACEMENT  UNDER FLUOROSCOPY MEDICATIONS: None ANESTHESIA/SEDATION: None CONTRAST:  15 mL Isovue-300-administered into the gastric lumen. FLUOROSCOPY TIME:  Fluoroscopy Time: 1 minutes and 12 seconds. 26.1 mGy. COMPLICATIONS: None immediate. PROCEDURE: Informed written consent was obtained from the patient's wife after a thorough discussion of the procedural risks, benefits and alternatives. All questions were addressed. Maximal Sterile Barrier Technique was utilized including caps, mask, sterile gowns, sterile gloves, sterile drape, hand hygiene and skin antiseptic. A timeout was performed prior to the initiation of the procedure. The gastrostomy exit site was prepped with chlorhexidine. A 5 French catheter was advanced through the skin opening. Contrast was then injected under fluoroscopy. A guidewire was advanced into the gastric lumen. An 65 French balloon retention gastrostomy tube was then advanced over the wire and into the stomach. The retention balloon was inflated with 7 mL of saline. The gastrostomy tube was injected with contrast to confirm position and a fluoroscopic image saved. FINDINGS: The tract from the skin into the gastric lumen was able to be catheterized, allowing placement of a balloon retention catheter. After placement, the tip of the catheter lies in the body of the stomach. The catheter may be used immediately. IMPRESSION: Replacement of gastrostomy with ability to recanalize the recent gastrostomy tube tract and place an 37 French balloon retention catheter into the gastric lumen. Electronically Signed   By: Aletta Edouard M.D.   On: 09/04/2018 15:36   [place] Ekg Site Rite  Result Date: 08/25/2018 If Site Rite image not attached, placement could not be confirmed due to current cardiac rhythm.  US Abdomen Limited Ruq  Result Date: 08/24/2018 CLINICAL DATA:  History of hepatitis EXAM: ULTRASOUND ABDOMEN LIMITED RIGHT UPPER QUADRANT COMPARISON:  Abdominal and pelvic CT scan of August 11, 2018  FINDINGS: The study is limited due to the patient's clinical status. Gallbladder: No gallstones or wall thickening visualized. No sonographic Murphy sign noted by sonographer. Common bile duct: Diameter: 1.9 mm Liver: No focal lesion identified. Within normal limits in parenchymal echogenicity. No intrahepatic ductal dilation. The surface contour is smooth. Portal vein is patent on color Doppler imaging with normal direction of blood flow towards the liver. IMPRESSION: Normal limited right upper quadrant abdominal ultrasound examination. Electronically Signed   By: David  Martinique M.D.   On: 08/24/2018 09:33    Consults:    Subjective:    Overnight Issues: No significant events noted overnight  Objective:  Vital signs for last 24 hours: Temp:  [98.1 F (36.7 C)-99.3 F (37.4 C)] 99 F (37.2 C) (01/31 0700) Pulse Rate:  [77-94] 82 (01/31 0700) Resp:  [11-23] 16 (01/31 0700) BP: (93-139)/(63-74) 103/71 (01/31 0700) SpO2:  [97 %-100 %] 100 % (01/31 0700) FiO2 (%):  [35 %] 35 % (01/31 0500) Weight:  [62.6 kg] 62.6 kg (01/31 0436)  Hemodynamic parameters for last 24 hours:    Intake/Output from previous day: 01/30 0701 - 01/31 0700 In: 1765 [I.V.:160.1; NG/GT:1040; IV Piggyback:249.9] Out: 725 [Urine:725]  Intake/Output this shift: No intake/output data recorded.  Vent settings for last 24 hours: Vent Mode: PRVC FiO2 (%):  [35 %] 35 % Set Rate:  [16 bmp] 16 bmp Vt Set:  [450 mL] 450 mL PEEP:  [5 cmH20] 5 cmH20 Plateau Pressure:  [12 cmH20-16 cmH20] 16 cmH20  Physical Exam:  Chronically ill-appearing elderly male HEENT: Trachea midline, no thyromegaly noted, orally intubated, no accessory muscle utilization Cardiovascular: Regular rate and rhythm Pulmonary: Clear to auscultation Abdominal: Positive bowel sounds, PEG tube in place, soft exam Extremities: No clubbing, cyanosis or edema noted Neurologic: Patient limited neurologic exam, minimal responsiveness Cutaneous: No  rashes or lesions noted  Assessment/Plan:   Respiratory failure secondary to aspiration.  Patient is presently on clindamycin.  He is on mechanical ventilation protocol.  Chest x-ray reveals bibasilar infiltrates/atelectasis most likely consistent with chronic aspiration.  Cultures negative thus far, urinary antigen studies also negative  Prior history of dementia.  CT of head performed on the 28th   No acute intracranial process.  Stable moderate parenchymal brain volume loss, advanced for age.  Old small RIGHT cerebellar infarct and mild chronic small vessel  ischemic changes.   Hyperglycemia.  On scale insulin coverage  Anemia.  Mild, no evidence of active bleeding  Will discuss with family goals of care and begin spontaneous awakening and breathing trials as tolerated  Critical Care Total Time 35 minutes  Keren Alverio 09/22/2018  *Care during the described time interval was provided by me and/or other providers on the critical care team.  I have reviewed this patient's available data, including medical history, events of note, physical examination and test results as part of my evaluation.

## 2018-09-22 NOTE — Progress Notes (Signed)
Pharmacy Consult for Unasyn Dosing.  Indication: Pneumonia.  Pharmacy Antibiotic Note  Maurice Patterson is a 63 y.o. male admitted on 09/19/2018 with sepsis secondary to aspiration pneumonia from [facility]. Pt was in ICU 2 weeks ago for aspiration pneumonia. Is currently admitted back into ICU for aspiration pneumonia. PMH consists of acute MI, CAD, CHF, CKD, dementia, DM, GERD, hyperlipidemia. Pt is currently intubated.   Pharmacy has been consulted for Unasyn dosing.  Plan: Discontinue clindamycin 300mg  IV q8h. Initiate unasyn 3g IV q6h x 5 days.   Monitor CBC, renal function, and hepatic function.   Height: 5\' 6"  (167.6 cm) Weight: 130 lb 1.1 oz (59 kg) IBW/kg (Calculated) : 63.8  Temp (24hrs), Avg:100 F (37.8 C), Min:99.3 F (37.4 C), Max:101.1 F (38.4 C)  Recent Labs  Lab 09/19/18 2003 09/19/18 2306 09/20/18 0449 09/21/18 0507  WBC 12.7*  --  9.4 8.7  CREATININE 0.53*  --  0.50* 0.37*  LATICACIDVEN 2.6* 1.6  --   --     Estimated Creatinine Clearance: 80.9 mL/min (A) (by C-G formula based on SCr of 0.37 mg/dL (L)).    Allergies  Allergen Reactions  . Hydrocodone Anaphylaxis  . Morphine Other (See Comments)    Loss of memory  . Ambien [Zolpidem] Other (See Comments)    delirium    . Brilinta [Ticagrelor] Other (See Comments)    Stroke   . Flexeril [Cyclobenzaprine] Other (See Comments)    delerium   . Flunitrazepam Other (See Comments)    ROHYPNOL (hallucinations)  . Haldol [Haloperidol Lactate] Other (See Comments)    delerium   . Levetiracetam Diarrhea and Other (See Comments)    Unable to walk  . Lorazepam Hives  . Risperdal [Risperidone] Other (See Comments)    Delirium   . Trazodone Other (See Comments)    Delirium Can take in low doses   . Benadryl [Diphenhydramine Hcl (Sleep)] Rash  . Penicillins Rash    Mouth ulcers Has patient had a PCN reaction causing immediate rash, facial/tongue/throat swelling, SOB or lightheadedness with  hypotension: Yes Has patient had a PCN reaction causing severe rash involving mucus membranes or skin necrosis: No Has patient had a PCN reaction that required hospitalization No Has patient had a PCN reaction occurring within the last 10 years: Yes If all of the above answers are "NO", then may proceed with Cephalosporin use.  . Vancomycin Rash    Rash around IV site during vancomycin infusion    Antimicrobials this admission: 1/31 Unasyn >> 1/29 Clindamycin >> 1/30 1/29 Cefepime >> 1/29 1/29 Vancomycin x 1 dose  Microbiology results: 1/28 BCx: pending, no growth 3 days 1/28 LYH:[id number], no growth. 1/28 Sputum: Staphylococcus aureus. Resistant to erythromycin and  Clindamycin.  1/28 MRSA PCR: negative   Thank you for allowing pharmacy to be a part of this patient's care.  Durenda Hurt [facility] 09/22/2018 10:56 AM  Pharmacy Student

## 2018-09-22 NOTE — Progress Notes (Signed)
SLP Cancellation Note  Patient Details Name: Maurice Patterson MRN: [id number] DOB: Oct 28, 1955   Cancelled treatment:       Reason Eval/Treat Not Completed: Patient not medically ready;Medical issues which prohibited therapy(chart reviewed; consulted NSG re: pt's status today). Per chart notes and NSG, pt remains orally intubated and sedated. Pt was admitted w/ severe hypoxic and hypercapnic respiratory failure from [facility] after being found lying flat w/ copious amounts of green vomitus and phlegm in/around his mouth. Pt was orally intubated d/t respiratory status. Pt was poorly responsive at admission per notes.  ST services will continue to monitor pt's status and f/u w/ objective swallowing assessment once extubated; appropriate time. Pt currently has a PEG placement which was placed last admission d/t poor oral intake insufficient to meet nutrition/hydration needs. Pt had been taking "Pleasure" bites and sips of a dysphagia diet but any offering of po's was often impacted by lethargy, weakness overall.  Recommend frequent oral care. ST services will f/u on Monday. NSG agreed.     Orinda Kenner, MS, CCC-SLP Carvell Hoeffner 09/22/2018, 10:19 AM

## 2018-09-22 NOTE — Care Management Note (Signed)
Case Management Note  Patient Details  Name: Maurice Patterson MRN: [id number] Date of Birth: 09-15-1955  Subjective/Objective:                 Patient readmit from [facility] for respiratory failure due to chronic aspiration pneumonia.  he is currently intubated. He is a full code.  Action/Plan:  Reached out to attending and intensivist regarding palliative consult to address goals of care and code status  Expected Discharge Date:                  Expected Discharge Plan:     In-House Referral:     Discharge planning Services     Post Acute Care Choice:    Choice offered to:     DME Arranged:    DME Agency:     HH Arranged:    HH Agency:     Status of Service:     If discussed at [employer], dates discussed:    Additional Comments:  Katrina Stack, RN 09/22/2018, 11:40 AM

## 2018-09-22 NOTE — Progress Notes (Signed)
Pastoral Care visit   09/22/18 1200  Clinical Encounter Type  Visited With Patient and family together  Visit Type Initial;Spiritual support;Psychological support  Consult/Referral To Chaplain  Stress Factors  Family Stress Factors Health changes;Other (Comment) (frustration with medical care)   Chap visited w/ fam (wife and sister) of pt (pt unavailable).  Fam shared several concerns regarding pt care here and elsewhere.  Chap provided empathy and listened to fam story.  Medical team arrived cutting chap visit short.  Darcey Nora, Chaplain

## 2018-09-22 NOTE — Progress Notes (Signed)
[place] [facility] [place] NAME: Maurice Patterson    MR#:  [id number]  DATE OF BIRTH:  Feb 04, 1956  SUBJECTIVE:  CHIEF COMPLAINT: Patient is extubated and has strong cough.  Wife at bedside   REVIEW OF SYSTEMS:  Review of system unobtainable  DRUG ALLERGIES:   Allergies  Allergen Reactions  . Hydrocodone Anaphylaxis  . Morphine Other (See Comments)    Loss of memory  . Ambien [Zolpidem] Other (See Comments)    delirium    . Brilinta [Ticagrelor] Other (See Comments)    Stroke   . Flexeril [Cyclobenzaprine] Other (See Comments)    delerium   . Flunitrazepam Other (See Comments)    ROHYPNOL (hallucinations)  . Haldol [Haloperidol Lactate] Other (See Comments)    delerium   . Levetiracetam Diarrhea and Other (See Comments)    Unable to walk  . Lorazepam Hives  . Risperdal [Risperidone] Other (See Comments)    Delirium   . Trazodone Other (See Comments)    Delirium Can take in low doses   . Benadryl [Diphenhydramine Hcl (Sleep)] Rash  . Penicillins Rash    Mouth ulcers Has patient had a PCN reaction causing immediate rash, facial/tongue/throat swelling, SOB or lightheadedness with hypotension: Yes Has patient had a PCN reaction causing severe rash involving mucus membranes or skin necrosis: No Has patient had a PCN reaction that required hospitalization No Has patient had a PCN reaction occurring within the last 10 years: Yes If all of the above answers are "NO", then may proceed with Cephalosporin use.  . Vancomycin Rash    Rash around IV site during vancomycin infusion    VITALS:  Blood pressure 124/70, pulse (!) 103, temperature 98.8 F (37.1 C), resp. rate 17, height 5\' 6"  (1.676 m), weight 62.6 kg, SpO2 98 %.  PHYSICAL EXAMINATION:  GENERAL:  63 y.o.-year-old patient lying in the bed with no acute distress.  EYES: Pupils equal, round, reactive to light and accommodation. No scleral icterus. Extraocular muscles  intact.  HEENT: Head atraumatic, normocephalic.  NECK:  Supple, no jugular venous distention. No thyroid enlargement, no tenderness.  LUNGS: Moderate breath sounds bilaterally, no wheezing, rales,rhonchi or crepitation. No use of accessory muscles of respiration.  CARDIOVASCULAR: S1, S2 normal. No murmurs, rubs, or gallops.  ABDOMEN: Soft, nontender, nondistended. Bowel sounds present. EXTREMITIES: No pedal edema, cyanosis, or clubbing.  NEUROLOGIC: Patient is sedated. Gait not checked.  PSYCHIATRIC: The patient is sedated on vent  sKIN: No obvious rash, lesion, or ulcer.    LABORATORY PANEL:   CBC Recent Labs  Lab 09/22/18 0423  WBC 7.3  HGB 10.0*  HCT 31.4*  PLT 139*   ------------------------------------------------------------------------------------------------------------------  Chemistries  Recent Labs  Lab 09/19/18 2003  09/22/18 0423  NA 135   < > 137  K 4.2   < > 3.6  CL 100   < > 103  CO2 26   < > 29  GLUCOSE 202*   < > 208*  BUN 14   < > 14  CREATININE 0.53*   < > 0.42*  CALCIUM 8.7*   < > 8.0*  MG  --    < > 2.1  AST 23  --   --   ALT 43  --   --   ALKPHOS 180*  --   --   BILITOT 0.7  --   --    < > = values in this interval not displayed.   ------------------------------------------------------------------------------------------------------------------  Cardiac Enzymes Recent Labs  Lab 09/19/18 2003  TROPONINI <0.03   ------------------------------------------------------------------------------------------------------------------  RADIOLOGY:  Dg Chest Port 1 View  Result Date: 09/22/2018 CLINICAL DATA:  Acute respiratory failure. EXAM: PORTABLE CHEST 1 VIEW COMPARISON:  09/21/2018. FINDINGS: Endotracheal tube in stable position. Heart size normal. Coronary artery calcification. Persistent bibasilar atelectasis/infiltrates. Tiny bilateral scratched it tiny right pleural effusion. Chest is stable from prior exam. No pneumothorax. Old right rib  fractures again noted. IMPRESSION: 1.  Endotracheal tube in stable position. 2. Persistent bibasilar atelectasis/infiltrates. Small right pleural effusion. Similar findings noted on prior exam. 3.  Coronary artery disease.  Heart size normal. Electronically Signed   By: Marcello Moores  Register   On: 09/22/2018 05:34   Dg Chest Port 1 View  Result Date: 09/21/2018 CLINICAL DATA:  Acute respiratory failure EXAM: PORTABLE CHEST 1 VIEW COMPARISON:  Yesterday FINDINGS: Endotracheal tube tip at the clavicular heads. Low volume chest with interstitial and airspace opacity at the bases. Normal heart size. Coronary stent. No evident effusion or pneumothorax. IMPRESSION: 1. Stable endotracheal tube positioning. 2. Stable low volume chest with atelectasis or infection at the bases. Electronically Signed   By: Monte Fantasia M.D.   On: 09/21/2018 06:32    EKG:   Orders placed or performed during the hospital encounter of 09/19/18  . EKG 12-Lead  . EKG 12-Lead  . ED EKG 12-Lead  . ED EKG 12-Lead    ASSESSMENT AND PLAN:   #Acute respiratory failure secondary to aspiration pneumonia .Extubatedl Follow-up with intensivist -Tube feeds through PEG tube with aspiration precautions -Suctioning as needed  #Sepsis due to aspiration pneumonia IV antibiotics cefepime, clindamycin changed to IV Unasyn  IV fluids  #Diabetes mellitus sliding scale insulin  #GERD Pepcid  #Essential hypertension-blood pressure is stable at this time continue close monitoring  #Prior history of dementia-CT head on 28 with no acute intracranial process  #Severe malnutrition on nutritional supplements like ~1.5 cal and pro-stat  All the records are reviewed and case discussed with Care Management/Social Workerr. Management plans discussed with the patient wife at bedside . CODE STATUS: fc  TOTAL TIME TAKING CARE OF THIS PATIENT: 36  minutes.   POSSIBLE D/C IN 2-3 DAYS, DEPENDING ON CLINICAL CONDITION.  Note: This dictation  was prepared with Dragon dictation along with smaller phrase technology. Any transcriptional errors that result from this process are unintentional.   Maurice Patterson M.D on 09/22/2018 at 4:02 PM  Between 7am to 6pm - Pager - [phone] After 6pm go to [email or web address] - password [email or web address] [facility]  [place] [facility]  Office  [phone]  CC: Primary care physician; [name], MD

## 2018-09-22 NOTE — Progress Notes (Signed)
Pt grabbed onto his ETT repeatedly.  Mitts applied bilaterally for safety.

## 2018-09-22 NOTE — Clinical Social Work Note (Signed)
Renee at [facility] called this CSW back at 4:55pm and requested a PT consult be placed as they could not review for prior auth until PT had assessed patient. CSW contacted patient's ICU nurse, Katharine Look, and requested that a PT consult be obtained.  Shela Leff MSW,LCSW [phone]

## 2018-09-22 NOTE — Clinical Social Work Note (Signed)
Clinical Social Work Assessment  Patient Details  Name: Maurice Patterson MRN: [id number] Date of Birth: 12-Dec-1955  Date of referral:  09/22/18               Reason for consult:  Discharge Planning                Permission sought to share information with:    Permission granted to share information::     Name::        Agency::     Relationship::     Contact Information:     Housing/Transportation Living arrangements for the past 2 months:  [place] of Information:  Spouse Patient Interpreter Needed:  None Criminal Activity/Legal Involvement Pertinent to Current Situation/Hospitalization:  No - Comment as needed Significant Relationships:  Adult Children, Spouse, Other Family Members Lives with:  Facility Resident Do you feel safe going back to the place where you live?    Need for family participation in patient care:  Yes (Comment)  Care giving concerns:  Patient resides at home with his wife at baseline but has been in [facility] for rehab after having an extended hospital stay.   Social Worker assessment / plan:  CSW spoke with patient's wife this afternoon and she does wish for patient to return to [facility] at discharge. She stated that it was her understanding that he could be discharged possibly this coming Sunday or Monday. Patient's wife wanted to inform CSW so that the prior auth could be obtained again from [facility]. Patient's wife has [place] phone number should she need to contact me.   CSW contacted [employer] and began the authorization process for patient to be able to return to [facility]. Weekend CSW may need to follow up with [employer] over the weekend to see if they have made a determination regarding auth. Patient will require auth prior to returning to [facility].   Employment status:    Insurance informationEducational psychologist PT Recommendations:    Information / Referral to community resources:     Patient/Family's  Response to care:  Patient's wife expressed appreciation for CSW assistance.  Patient/Family's Understanding of and Emotional Response to Diagnosis, Current Treatment, and Prognosis:  Patient's wife is attempting to cope as best as she can with the stress of her husband's illness. She is hopeful that he is doing better and was able to come off the ventilator.   Emotional Assessment Appearance:  Appears older than stated age Attitude/Demeanor/Rapport:  (restless) Affect (typically observed):  Restless Orientation:    Alcohol / Substance use:  Not Applicable Psych involvement (Current and /or in the community):  No (Comment)  Discharge Needs  Concerns to be addressed:  Care Coordination Readmission within the last 30 days:  Yes Current discharge risk:  None Barriers to Discharge:  No Barriers Identified   Shela Leff, LCSW 09/22/2018, 3:11 PM

## 2018-09-22 NOTE — Progress Notes (Signed)
Pharmacy Electrolyte Monitoring Consult:  Pharmacy consulted to assist in monitoring and replacing electrolytes in this 63 y.o. male admitted on 09/19/2018 with Respiratory Distress   Labs:  Sodium (mmol/L)  Date Value  09/22/2018 137  10/20/2016 146 (H)  12/17/2014 138   Potassium (mmol/L)  Date Value  09/22/2018 3.6  12/17/2014 3.7   Magnesium (mg/dL)  Date Value  09/22/2018 2.1  12/17/2014 1.8   Phosphorus (mg/dL)  Date Value  09/21/2018 2.7   Calcium (mg/dL)  Date Value  09/22/2018 8.0 (L)   Calcium, Total (mg/dL)  Date Value  12/17/2014 7.9 (L)   Albumin (g/dL)  Date Value  09/19/2018 3.6  10/20/2016 4.5  12/17/2014 3.2 (L)    Assessment/Plan: Will order potassium 26mEq VT x 1.   Will order follow up BMP and magnesium with am labs.   Will replace for goa potassium ~ 4 and goal magnesium ~ 2.   Pharmacy will continue to monitor and adjust per consult.   Milanie Rosenfield L 09/22/2018 11:41 AM

## 2018-09-23 LAB — BASIC METABOLIC PANEL
ANION GAP: 7 (ref 5–15)
BUN: 9 mg/dL (ref 8–23)
CO2: 29 mmol/L (ref 22–32)
Calcium: 8.4 mg/dL — ABNORMAL LOW (ref 8.9–10.3)
Chloride: 102 mmol/L (ref 98–111)
Creatinine, Ser: 0.43 mg/dL — ABNORMAL LOW (ref 0.61–1.24)
GFR calc Af Amer: >60 mL/min (ref >60)
GFR calc non Af Amer: >60 mL/min (ref >60)
GLUCOSE: 101 mg/dL — AB (ref 70–99)
Potassium: 3.4 mmol/L — ABNORMAL LOW (ref 3.5–5.1)
Sodium: 138 mmol/L (ref 135–145)

## 2018-09-23 LAB — GLUCOSE, CAPILLARY
GLUCOSE-CAPILLARY: 119 mg/dL — AB (ref 70–99)
Glucose-Capillary: 105 mg/dL — ABNORMAL HIGH (ref 70–99)
Glucose-Capillary: 127 mg/dL — ABNORMAL HIGH (ref 70–99)
Glucose-Capillary: 146 mg/dL — ABNORMAL HIGH (ref 70–99)
Glucose-Capillary: 172 mg/dL — ABNORMAL HIGH (ref 70–99)
Glucose-Capillary: 204 mg/dL — ABNORMAL HIGH (ref 70–99)
Glucose-Capillary: 49 mg/dL — ABNORMAL LOW (ref 70–99)

## 2018-09-23 LAB — PHOSPHORUS: Phosphorus: 2.6 mg/dL (ref 2.5–4.6)

## 2018-09-23 MED ORDER — DEXTROSE 50 % IV SOLN
25.0000 mL | Freq: Once | INTRAVENOUS | Status: AC
  Administered 2018-09-23: 25 mL via INTRAVENOUS

## 2018-09-23 MED ORDER — VITAL HIGH PROTEIN PO LIQD: 1000.0000 mL | ORAL | Status: DC

## 2018-09-23 MED ORDER — PRO-STAT SUGAR FREE PO LIQD
30.0000 mL | Freq: Three times a day (TID) | ORAL | Status: DC
  Administered 2018-09-23 (×2): 30 mL

## 2018-09-23 MED ORDER — DEXTROSE 50 % IV SOLN
INTRAVENOUS | Status: AC
  Administered 2018-09-23: 25 mL via INTRAVENOUS
  Filled 2018-09-23: qty 50

## 2018-09-23 MED ORDER — POTASSIUM CHLORIDE 20 MEQ PO PACK
60.0000 meq | PACK | Freq: Once | ORAL | Status: AC
  Administered 2018-09-23: 60 meq
  Filled 2018-09-23: qty 3

## 2018-09-23 MED ORDER — VITAL 1.5 CAL PO LIQD
1000.0000 mL | ORAL | Status: DC
  Administered 2018-09-23: 1000 mL

## 2018-09-23 MED ORDER — ADULT MULTIVITAMIN LIQUID CH
15.0000 mL | Freq: Every day | ORAL | Status: DC
  Administered 2018-09-23: 15 mL via ORAL
  Filled 2018-09-23 (×3): qty 15

## 2018-09-23 NOTE — Progress Notes (Addendum)
Patient ID: Maurice Patterson, male   DOB: Aug 19, 1956, 63 y.o.   MRN: [id number]  [employer] PROGRESS NOTE  OLIVER HEITZENRATER BZM:[id number] DOB: [birth year] DOA: 09/19/2018 PCP: [name], MD  HPI/Subjective: Patient feels okay.  Did have a little abdominal discomfort earlier.  No shortness of breath or cough.  Objective: Vitals:   09/23/18 0900 09/23/18 1000  BP: 136/90 123/87  Pulse: 86 93  Resp: 14 17  Temp: 98.2 F (36.8 C) 99 F (37.2 C)  SpO2: 98% 96%    Filed Weights   09/21/18 0115 09/22/18 0436 09/23/18 0436  Weight: 59 kg 62.6 kg 61 kg    ROS: Review of Systems  Unable to perform ROS: Acuity of condition  Respiratory: Negative for cough and shortness of breath.   Cardiovascular: Negative for chest pain.  Gastrointestinal: Negative for abdominal pain.   Exam: Physical Exam  HENT:  Nose: No mucosal edema.  Mouth/Throat: No oropharyngeal exudate.  Eyes: Pupils are equal, round, and reactive to light. Conjunctivae and lids are normal.  Neck: Carotid bruit is not present. No thyroid mass present.  Cardiovascular: Regular rhythm, S1 normal, S2 normal and normal heart sounds.  Respiratory: He has no decreased breath sounds. He has no wheezes. He has no rhonchi. He has no rales.  GI: Soft. Bowel sounds are normal. There is no abdominal tenderness.  Musculoskeletal:     Right ankle: He exhibits no swelling.     Left ankle: He exhibits no swelling.  Neurological: He is alert.  Follows commands.  Able to lift his arms and legs up off the bed.  Skin: Skin is warm. Nails show no clubbing.  As per nurse, deep tissue injury purpleish hue on sacrum  Psychiatric: He has a normal mood and affect.  Able to follow commands and answer few yes or no questions.  This is the best mental status I have seen him with.      Data Reviewed: Basic Metabolic Panel: Recent Labs  Lab 09/19/18 2003 09/20/18 0449 09/21/18 0507 09/22/18 0423 09/23/18 0439  NA 135 136  135 137 138  K 4.2 3.9 3.7 3.6 3.4*  CL 100 106 106 103 102  CO2 _0 GLUCOSE 202* 92 190* 208* 101*  BUN _1 CREATININE 0.53* 0.50* 0.37* 0.42* 0.43*  CALCIUM 8.7* 7.8* 7.9* 8.0* 8.4*  MG  --   --  1.8 2.1  --   PHOS  --   --  2.7  --   --    Liver Function Tests: Recent Labs  Lab 09/19/18 2003  AST 23  ALT 43  ALKPHOS 180*  BILITOT 0.7  PROT 6.8  ALBUMIN 3.6   CBC: Recent Labs  Lab 09/19/18 2003 09/20/18 0449 09/21/18 0507 09/22/18 0423  WBC 12.7* 9.4 8.7 7.3  NEUTROABS 9.6*  --   --   --   HGB 14.2 12.5* 10.8* 10.0*  HCT 42.7 39.8 33.3* 31.4*  MCV 91.4 96.6 96.0 96.0  PLT 215 164 130* 139*   Cardiac Enzymes: Recent Labs  Lab 09/19/18 2003  TROPONINI <0.03   BNP (last 3 results) Recent Labs    07/22/18 2239 08/03/18 1120  BNP 40.0 95.0     CBG: Recent Labs  Lab 09/22/18 2350 09/23/18 0352 09/23/18 0417 09/23/18 0735 09/23/18 1224  GLUCAP 89 49* 119* 105* 146*    Recent Results (from the past 240 hour(s))  Culture, respiratory (non-expectorated)  Status: None   Collection Time: 09/19/18  1:14 AM  Result Value Ref Range Status   Specimen Description TRACHEAL ASPIRATE  Final   Special Requests Normal  Final   Gram Stain   Final    FEW WBC PRESENT, PREDOMINANTLY PMN FEW GRAM POSITIVE COCCI IN PAIRS IN CLUSTERS RARE GRAM POSITIVE RODS RARE GRAM NEGATIVE RODS    Culture FEW STAPHYLOCOCCUS AUREUS  Final   Report Status 09/22/2018 FINAL  Final   Organism ID, Bacteria STAPHYLOCOCCUS AUREUS  Final      Susceptibility   Staphylococcus aureus - MIC*    CIPROFLOXACIN <=0.5 SENSITIVE Sensitive     ERYTHROMYCIN >=8 RESISTANT Resistant     GENTAMICIN <=0.5 SENSITIVE Sensitive     OXACILLIN 0.5 SENSITIVE Sensitive     TETRACYCLINE <=1 SENSITIVE Sensitive     VANCOMYCIN <=0.5 SENSITIVE Sensitive     TRIMETH/SULFA <=10 SENSITIVE Sensitive     CLINDAMYCIN RESISTANT Resistant     RIFAMPIN <=0.5 SENSITIVE Sensitive      Inducible Clindamycin POSITIVE Resistant     * FEW STAPHYLOCOCCUS AUREUS  Blood Culture (routine x 2)     Status: None (Preliminary result)   Collection Time: 09/19/18  8:03 PM  Result Value Ref Range Status   Specimen Description BLOOD BLOOD RIGHT FOREARM  Final   Special Requests   Final    BOTTLES DRAWN AEROBIC AND ANAEROBIC Blood Culture results may not be optimal due to an excessive volume of blood received in culture bottles   Culture   Final    NO GROWTH 4 DAYS Performed at [facility], [address] [place], [place] [address]    Report Status PENDING  Incomplete  Blood Culture (routine x 2)     Status: None (Preliminary result)   Collection Time: 09/19/18  8:03 PM  Result Value Ref Range Status   Specimen Description BLOOD LEFT ANTECUBITAL  Final   Special Requests   Final    BOTTLES DRAWN AEROBIC AND ANAEROBIC Blood Culture adequate volume   Culture   Final    NO GROWTH 4 DAYS Performed at [facility], [address], [place], [place] [address]    Report Status PENDING  Incomplete  Urine culture     Status: None   Collection Time: 09/19/18  8:03 PM  Result Value Ref Range Status   Specimen Description   Final    URINE, RANDOM Performed at [facility], [address] [place], [place] [address]    Special Requests   Final    NONE Performed at [facility], [address] [place], [place] [address]    Culture   Final    NO GROWTH Performed at [facility], [place] [address] [place], [place] [address]    Report Status 09/21/2018 FINAL  Final  MRSA PCR Screening     Status: None   Collection Time: 09/19/18 10:52 PM  Result Value Ref Range Status   MRSA by PCR NEGATIVE NEGATIVE Final    Comment:        The GeneXpert MRSA Assay (FDA approved for NASAL specimens only), is one component of a comprehensive MRSA colonization surveillance program. It is not intended to diagnose MRSA infection nor to guide or monitor  treatment for MRSA infections. Performed at [facility], [address], [place], [place] [address]      Studies: Dg Chest [facility] 1 View  Result Date: 09/22/2018 CLINICAL DATA:  Acute respiratory failure. EXAM: PORTABLE  CHEST 1 VIEW COMPARISON:  09/21/2018. FINDINGS: Endotracheal tube in stable position. Heart size normal. Coronary artery calcification. Persistent bibasilar atelectasis/infiltrates. Tiny bilateral scratched it tiny right pleural effusion. Chest is stable from prior exam. No pneumothorax. Old right rib fractures again noted. IMPRESSION: 1.  Endotracheal tube in stable position. 2. Persistent bibasilar atelectasis/infiltrates. Small right pleural effusion. Similar findings noted on prior exam. 3.  Coronary artery disease.  Heart size normal. Electronically Signed   By: Marcello Moores  Register   On: 09/22/2018 05:34    Scheduled Meds: . aspirin  81 mg Oral Daily  . chlorhexidine gluconate (MEDLINE KIT)  15 mL Mouth Rinse BID  . cholecalciferol  1,000 Units Oral Daily  . clopidogrel  75 mg Oral Daily  . donepezil  10 mg Oral QHS  . enoxaparin (LOVENOX) injection  40 mg Subcutaneous Q24H  . feeding supplement (PRO-STAT SUGAR FREE 64)  30 mL Per Tube TID  . insulin aspart  0-15 Units Subcutaneous Q4H  . insulin aspart  3 Units Subcutaneous Q4H  . insulin glargine  4 Units Subcutaneous QHS  . memantine  10 mg Oral Daily  . multivitamin  15 mL Oral Daily   Continuous Infusions: . ampicillin-sulbactam (UNASYN) IV 3 g (09/23/18 0839)  . famotidine (PEPCID) IV 20 mg (09/22/18 1400)  . feeding supplement (VITAL 1.5 CAL) 1,000 mL (09/23/18 1031)    Assessment/Plan:   1. Acute hypoxic respiratory failure secondary to aspiration pneumonia.  2. Clinical sepsis due to aspiration pneumonia.  Patient on IV Unasyn.  Culture growing out sensitive staph aureus. 3. Type 2 diabetes mellitus on low-dose glargine insulin and aspart insulin. 4. Severe malnutrition on tube  feedings 5. History of dementia on Namenda and Aricept 6. Gastric ulceration on Pepcid 7. Deep tissue injury with purplish discoloration on buttock  Code Status:     Code Status Orders  (From admission, onward)         Start     Ordered   09/19/18 2130  Full code  Continuous     09/19/18 2129        Code Status History    Date Active Date Inactive Code Status Order ID Comments User Context   07/23/2018 0122 09/08/2018 2029 Full Code [id number]  Lance Coon, MD Inpatient   04/05/2016 0304 04/05/2016 1851 Full Code [id number]  Harvie Bridge, DO Inpatient   10/19/2015 0103 10/20/2015 1102 Full Code [id number]  Harrie Foreman, MD Inpatient   08/21/2015 1912 08/23/2015 1657 Full Code [id number]  Gladstone Lighter, MD Inpatient   02/19/2015 1627 02/20/2015 2054 Full Code [id number]  Henreitta Leber, MD Inpatient     Family Communication: Wife at the bedside Disposition Plan: Likely back to rehab on Monday  Consultants:  Critical care specialist  Speech therapy  Antibiotics:  Unasyn  Time spent: 30 minutes including ACP time  [employer]

## 2018-09-23 NOTE — Clinical Social Work Note (Addendum)
CSW received phone call from [employer], who said they are denying patient to return to [facility] for SNF placement.  Insurance company offered a Peer to Peer with the Market researcher Dr. Amalia Hailey [phone], CSW sent a message to attending physician.  CSW awaiting results of Peer to Peer, CSW updated patient's wife Cecille Rubin, [phone] who expressed that she did not understand why they would deny patient.  CSW explained the insurance company makes a team decision and they do not tell CSW reason why patient was denied.  Patient's wife wanted to speak with attending physician she had some other questions, sent message to attending physician to call when he has a chance.   Jones Broom. [place], MSW, [place]  09/23/2018 4:17 PM

## 2018-09-23 NOTE — Progress Notes (Signed)
Follow up - Critical Care Medicine Note  Patient Details:    Maurice Patterson is an 63 y.o. male. admitted with Acute Hypoxic Respiratory Failure in setting of Aspiration, Sepsis, and Altered Mental Status.  He required intubation in the ED  Lines, Airways, Drains: Airway 7.5 mm (Active)  Secured at (cm) 22 cm 09/22/2018  7:00 AM  Measured From Lips 09/22/2018  7:00 AM  Secured Location Right 09/22/2018  3:32 AM  Secured By [employer] 09/22/2018  3:32 AM  Tube Holder Repositioned Yes 09/22/2018  3:32 AM  Cuff Pressure (cm H2O) 25 cm H2O 09/22/2018  3:32 AM  Site Condition Dry 09/22/2018  7:00 AM     Gastrostomy/Enterostomy Percutaneous endoscopic gastrostomy (PEG) LUQ (Active)  Surrounding Skin Dry 09/22/2018  7:00 AM  Tube Status Patent 09/22/2018  7:00 AM  Dressing Status Clean;Dry;Intact 09/22/2018  7:00 AM  Dressing Intervention Dressing changed 09/21/2018 12:00 PM  Dressing Type Split gauze 09/22/2018  7:00 AM  G Port Intake (mL) 110 ml 09/21/2018  4:00 PM     Urethral Catheter ED RN (Active)  Indication for Insertion or Continuance of Catheter Unstable spinal/crush injuries 09/22/2018  7:00 AM  Site Assessment Clean;Intact 09/22/2018  7:00 AM  Catheter Maintenance Bag below level of bladder;Catheter secured;Drainage bag/tubing not touching floor;Seal intact;Bag emptied prior to transport;No dependent loops;Insertion date on drainage bag 09/22/2018  8:00 AM  Collection Container Standard drainage bag 09/22/2018  7:00 AM  Securement Method Securing device (Describe) 09/22/2018  7:00 AM  Urinary Catheter Interventions Unclamped 09/22/2018  7:00 AM  Output (mL) 60 mL 09/22/2018  6:00 AM    Anti-infectives:  Anti-infectives (From admission, onward)   Start     Dose/Rate Route Frequency Ordered Stop   09/22/18 0930  Ampicillin-Sulbactam (UNASYN) 3 g in sodium chloride 0.9 % 100 mL IVPB     3 g 200 mL/hr over 30 Minutes Intravenous Every 6 hours 09/22/18 0926     09/21/18 1400   clindamycin (CLEOCIN) IVPB 600 mg  Status:  Discontinued     600 mg 100 mL/hr over 30 Minutes Intravenous Every 8 hours 09/21/18 1158 09/22/18 0925   09/20/18 0800  vancomycin (VANCOCIN) IVPB 750 mg/150 ml premix  Status:  Discontinued     750 mg 150 mL/hr over 60 Minutes Intravenous Every 12 hours 09/19/18 2019 09/20/18 1005   09/20/18 0400  ceFEPIme (MAXIPIME) 2 g in sodium chloride 0.9 % 100 mL IVPB  Status:  Discontinued     2 g 200 mL/hr over 30 Minutes Intravenous Every 8 hours 09/19/18 2012 09/20/18 1005   09/20/18 0300  clindamycin (CLEOCIN) IVPB 300 mg  Status:  Discontinued     300 mg 100 mL/hr over 30 Minutes Intravenous Every 8 hours 09/19/18 2311 09/21/18 1015   09/19/18 2115  clindamycin (CLEOCIN) IVPB 900 mg     900 mg 100 mL/hr over 30 Minutes Intravenous  Once 09/19/18 2101 09/19/18 2235   09/19/18 2000  vancomycin (VANCOCIN) IVPB 1000 mg/200 mL premix  Status:  Discontinued     1,000 mg 200 mL/hr over 60 Minutes Intravenous  Once 09/19/18 1953 09/19/18 2044   09/19/18 2000  ceFEPIme (MAXIPIME) 2 g in sodium chloride 0.9 % 100 mL IVPB     2 g 200 mL/hr over 30 Minutes Intravenous  Once 09/19/18 1953 09/19/18 2100      Microbiology: Results for orders placed or performed during the hospital encounter of 09/19/18  Culture, respiratory (non-expectorated)     Status:  None   Collection Time: 09/19/18  1:14 AM  Result Value Ref Range Status   Specimen Description TRACHEAL ASPIRATE  Final   Special Requests Normal  Final   Gram Stain   Final    FEW WBC PRESENT, PREDOMINANTLY PMN FEW GRAM POSITIVE COCCI IN PAIRS IN CLUSTERS RARE GRAM POSITIVE RODS RARE GRAM NEGATIVE RODS    Culture FEW STAPHYLOCOCCUS AUREUS  Final   Report Status 09/22/2018 FINAL  Final   Organism ID, Bacteria STAPHYLOCOCCUS AUREUS  Final      Susceptibility   Staphylococcus aureus - MIC*    CIPROFLOXACIN <=0.5 SENSITIVE Sensitive     ERYTHROMYCIN >=8 RESISTANT Resistant     GENTAMICIN <=0.5  SENSITIVE Sensitive     OXACILLIN 0.5 SENSITIVE Sensitive     TETRACYCLINE <=1 SENSITIVE Sensitive     VANCOMYCIN <=0.5 SENSITIVE Sensitive     TRIMETH/SULFA <=10 SENSITIVE Sensitive     CLINDAMYCIN RESISTANT Resistant     RIFAMPIN <=0.5 SENSITIVE Sensitive     Inducible Clindamycin POSITIVE Resistant     * FEW STAPHYLOCOCCUS AUREUS  Blood Culture (routine x 2)     Status: None (Preliminary result)   Collection Time: 09/19/18  8:03 PM  Result Value Ref Range Status   Specimen Description BLOOD BLOOD RIGHT FOREARM  Final   Special Requests   Final    BOTTLES DRAWN AEROBIC AND ANAEROBIC Blood Culture results may not be optimal due to an excessive volume of blood received in culture bottles   Culture   Final    NO GROWTH 4 DAYS Performed at [facility], [address] [place], [place] [address]    Report Status PENDING  Incomplete  Blood Culture (routine x 2)     Status: None (Preliminary result)   Collection Time: 09/19/18  8:03 PM  Result Value Ref Range Status   Specimen Description BLOOD LEFT ANTECUBITAL  Final   Special Requests   Final    BOTTLES DRAWN AEROBIC AND ANAEROBIC Blood Culture adequate volume   Culture   Final    NO GROWTH 4 DAYS Performed at [facility], [address] [place], [place] [address]    Report Status PENDING  Incomplete  Urine culture     Status: None   Collection Time: 09/19/18  8:03 PM  Result Value Ref Range Status   Specimen Description   Final    URINE, RANDOM Performed at [facility], [address], [place], [place] [address]    Special Requests   Final    NONE Performed at [facility], [address], [place], [place] [address]    Culture   Final    NO GROWTH Performed at [facility], [place] [address] [place], [place] [address]    Report Status 09/21/2018 FINAL  Final  MRSA PCR Screening     Status: None   Collection Time: 09/19/18 10:52 PM  Result Value Ref Range Status   MRSA by  PCR NEGATIVE NEGATIVE Final    Comment:        The GeneXpert MRSA Assay (FDA approved for NASAL specimens only), is one component of a comprehensive MRSA colonization surveillance program. It is not intended to diagnose MRSA infection nor to guide or monitor treatment for MRSA infections. Performed at [facility], [address] [place], [place] [address]     Best Practice/Protocols:   Lovenox/Pepcid  Studies: Dg Chest 1 View  Result Date: 09/19/2018 CLINICAL DATA:  Status post  intubation. EXAM: CHEST  1 VIEW COMPARISON:  09/19/2018 at 1954 hours FINDINGS: New endotracheal tube tip projects 1.6 cm above the Carina. No change in the appearance of the lungs from the recent prior exam. No new lung abnormalities. IMPRESSION: Well-positioned endotracheal tube. No other change from the prior study. Electronically Signed   By: Lajean Manes M.D.   On: 09/19/2018 21:00   Dg Chest 2 View  Result Date: 08/29/2018 CLINICAL DATA:  Aspiration pneumonia. EXAM: CHEST - 2 VIEW COMPARISON:  Chest and abdominal radiographs 08/25/2018 FINDINGS: Unchanged heart size and mediastinal contours. Coronary stent. Slightly improved aeration from prior exam. No significant change in streaky bibasilar opacities. Bronchial thickening is also unchanged. No new airspace disease. There are small bilateral pleural effusions. No pneumothorax. Remote right rib fractures. IMPRESSION: 1. Slightly improved aeration from prior exam. Unchanged streaky bibasilar opacities that may be atelectasis or seen in the setting of aspiration. 2. Small pleural effusions. Electronically Signed   By: Keith Rake M.D.   On: 08/29/2018 21:35   Dg Abd 1 View  Result Date: 09/20/2018 CLINICAL DATA:  Gastrostomy placement. EXAM: ABDOMEN - 1 VIEW COMPARISON:  Radiograph of September 11, 2018. FINDINGS: The bowel gas pattern is normal. Gastrostomy balloon tip appears to be within the gastric lumen. Contrast is noted within the  gastric lumen and proximal duodenum. No radio-opaque calculi or other significant radiographic abnormality are seen. IMPRESSION: Gastrostomy tube appears to be well positioned within gastric lumen. Electronically Signed   By: Marijo Conception, M.D.   On: 09/20/2018 13:39   Dg Abd 1 View  Result Date: 09/19/2018 CLINICAL DATA:  Peg tube status EXAM: ABDOMEN - 1 VIEW COMPARISON:  09/09/2018 FINDINGS: Gastrostomy tube tip projects over the left upper quadrant. No free air, organomegaly or evidence of bowel obstruction. Moderate stool in the colon. IMPRESSION: Gastrostomy tube tip projects over the left upper quadrant. Moderate stool burden in the colon. Electronically Signed   By: Rolm Baptise M.D.   On: 09/19/2018 23:49   Dg Abdomen 1 View  Result Date: 09/09/2018 CLINICAL DATA:  Encounter for evaluation for G-tube placement. EXAM: ABDOMEN - 1 VIEW 50 mL Isovue-300 administered through G-tube. COMPARISON:  None. FINDINGS: Contrast is identified in the G-tube and in the stomach without evidence of leak. IMPRESSION: Contrast is identified in the GB tube and in stomach without evidence of leak. Electronically Signed   By: Abelardo Diesel M.D.   On: 09/09/2018 14:47   Dg Abd 1 View  Result Date: 08/25/2018 CLINICAL DATA:  NG tube placement. EXAM: ABDOMEN - 1 VIEW COMPARISON:  08/17/2018 FINDINGS: The feeding tube has been removed. A small caliber enteric tube has been placed and terminates over the proximal stomach with side hole below the diaphragm. Gas and a moderate amount of stool are present in the colon. No dilated loops of bowel are seen to suggest obstruction. The lungs were more fully evaluated on today's earlier chest radiograph. IMPRESSION: Enteric tube terminates over the proximal stomach. Electronically Signed   By: Logan Bores M.D.   On: 08/25/2018 17:25   Ct Head Wo Contrast  Result Date: 09/19/2018 CLINICAL DATA:  Altered mental status, possible aspiration. Found down at rehab. History of  stroke, dementia, hypertension, hyperlipidemia, pancreatic cancer. EXAM: CT HEAD WITHOUT CONTRAST TECHNIQUE: Contiguous axial images were obtained from the base of the skull through the vertex without intravenous contrast. COMPARISON:  CT HEAD August 25, 2018 FINDINGS: BRAIN: No intraparenchymal hemorrhage, mass effect nor midline shift. Moderate  global parenchymal brain volume loss. No hydrocephalus. No hydrocephalus. Patchy supratentorial white matter hypodensities. Old small RIGHT cerebellar infarcts. No acute large vascular territory infarcts. No abnormal extra-axial fluid collections. Basal cisterns are patent. VASCULAR: Moderate calcific atherosclerosis of the carotid siphons. SKULL: No skull fracture. No significant scalp soft tissue swelling. SINUSES/ORBITS: Mild paranasal sinus mucosal thickening. Mastoid air cells are well aerated.The included ocular globes and orbital contents are non-suspicious. OTHER: Patient is edentulous. IMPRESSION: 1. No acute intracranial process. 2. Stable moderate parenchymal brain volume loss, advanced for age. 3. Old small RIGHT cerebellar infarct and mild chronic small vessel ischemic changes. Electronically Signed   By: Elon Alas M.D.   On: 09/19/2018 22:54   Ct Head Wo Contrast  Result Date: 08/25/2018 CLINICAL DATA:  Altered mental status (AMS), unclear cause EXAM: CT HEAD WITHOUT CONTRAST TECHNIQUE: Contiguous axial images were obtained from the base of the skull through the vertex without intravenous contrast. COMPARISON:  Head CT 08/08/2018 FINDINGS: Brain: Unchanged atrophy and chronic small vessel ischemia. No intracranial hemorrhage, mass effect, or midline shift. No hydrocephalus. The basilar cisterns are patent. No evidence of territorial infarct or acute ischemia. No extra-axial or intracranial fluid collection. Vascular: Atherosclerosis of skullbase vasculature without hyperdense vessel or abnormal calcification. Skull: No fracture or focal lesion.  Sinuses/Orbits: Paranasal sinuses and mastoid air cells are clear. The visualized orbits are unremarkable. Other: None. IMPRESSION: 1. No acute intracranial abnormality. 2. Unchanged atrophy and chronic small vessel ischemia. Electronically Signed   By: Keith Rake M.D.   On: 08/25/2018 02:17   Dg Chest Port 1 View  Result Date: 09/22/2018 CLINICAL DATA:  Acute respiratory failure. EXAM: PORTABLE CHEST 1 VIEW COMPARISON:  09/21/2018. FINDINGS: Endotracheal tube in stable position. Heart size normal. Coronary artery calcification. Persistent bibasilar atelectasis/infiltrates. Tiny bilateral scratched it tiny right pleural effusion. Chest is stable from prior exam. No pneumothorax. Old right rib fractures again noted. IMPRESSION: 1.  Endotracheal tube in stable position. 2. Persistent bibasilar atelectasis/infiltrates. Small right pleural effusion. Similar findings noted on prior exam. 3.  Coronary artery disease.  Heart size normal. Electronically Signed   By: Marcello Moores  Register   On: 09/22/2018 05:34   Dg Chest Port 1 View  Result Date: 09/21/2018 CLINICAL DATA:  Acute respiratory failure EXAM: PORTABLE CHEST 1 VIEW COMPARISON:  Yesterday FINDINGS: Endotracheal tube tip at the clavicular heads. Low volume chest with interstitial and airspace opacity at the bases. Normal heart size. Coronary stent. No evident effusion or pneumothorax. IMPRESSION: 1. Stable endotracheal tube positioning. 2. Stable low volume chest with atelectasis or infection at the bases. Electronically Signed   By: Monte Fantasia M.D.   On: 09/21/2018 06:32   Portable Chest Xray  Result Date: 09/20/2018 CLINICAL DATA:  Acute respiratory failure. EXAM: PORTABLE CHEST 1 VIEW COMPARISON:  09/19/2018 and 08/30/2018 FINDINGS: Endotracheal tube in good position 4.6 cm above the carina. The heart size and pulmonary vascularity are normal. There is chronic accentuation of the interstitial markings with new slight atelectasis at the right  lung base. No acute bone abnormality. Multiple old healed right posterior rib fractures. IMPRESSION: New slight atelectasis at the left lung base.  No other change. Electronically Signed   By: Lorriane Shire M.D.   On: 09/20/2018 07:11   Dg Chest Port 1 View  Result Date: 09/19/2018 CLINICAL DATA:  Possible aspiration EXAM: PORTABLE CHEST 1 VIEW COMPARISON:  08/30/2018, 08/29/2018, 08/25/2018, 08/17/2018, 12/12/2017 FINDINGS: Mild bibasilar airspace disease, slightly improved since 08/30/2018. No new opacity or pleural  effusion. Stable cardiomediastinal silhouette. No pneumothorax. Old right-sided rib fractures. IMPRESSION: Slightly improved airspace disease at both lung bases Electronically Signed   By: Donavan Foil M.D.   On: 09/19/2018 20:10   Dg Chest Port 1 View  Result Date: 08/30/2018 CLINICAL DATA:  Hypoxia EXAM: PORTABLE CHEST 1 VIEW COMPARISON:  Yesterday FINDINGS: Normal heart size and mediastinal contours. Coronary stent. Stable interstitial coarsening and bandlike opacity at the bases. There is no edema, effusion, or pneumothorax. Remote right rib fractures. IMPRESSION: Unchanged atelectasis or bronchopneumonia. Electronically Signed   By: Monte Fantasia M.D.   On: 08/30/2018 05:37   Dg Chest Port 1 View  Result Date: 08/25/2018 CLINICAL DATA:  Assess for aspiration. EXAM: PORTABLE CHEST 1 VIEW COMPARISON:  Chest radiograph performed 08/17/2018 FINDINGS: The lungs are well-aerated. Minimal bibasilar opacities may reflect atelectasis or possibly mild pneumonia; given clinical concern, aspiration cannot be entirely excluded. Peribronchial thickening is noted. There is no evidence of pleural effusion or pneumothorax. The cardiomediastinal silhouette is within normal limits. No acute osseous abnormalities are seen. A large chronic sclerotic lesion is noted at the left mid humerus. IMPRESSION: Minimal bibasilar opacities may reflect atelectasis or possibly mild pneumonia; given clinical concern,  aspiration cannot be entirely excluded. Peribronchial thickening noted. Electronically Signed   By: Garald Balding M.D.   On: 08/25/2018 00:48   Dg Replc Gastro/colonic Tube Percut W/fluoro  Result Date: 09/04/2018 INDICATION: Patient has inter vertically pulled out a bumper retention gastrostomy tube placed endoscopically on 08/28/2018. Request has been made to try to replace the gastrostomy through the exit site. EXAM: GASTROSTOMY TUBE REPLACEMENT UNDER FLUOROSCOPY MEDICATIONS: None ANESTHESIA/SEDATION: None CONTRAST:  15 mL Isovue-300-administered into the gastric lumen. FLUOROSCOPY TIME:  Fluoroscopy Time: 1 minutes and 12 seconds. 26.1 mGy. COMPLICATIONS: None immediate. PROCEDURE: Informed written consent was obtained from the patient's wife after a thorough discussion of the procedural risks, benefits and alternatives. All questions were addressed. Maximal Sterile Barrier Technique was utilized including caps, mask, sterile gowns, sterile gloves, sterile drape, hand hygiene and skin antiseptic. A timeout was performed prior to the initiation of the procedure. The gastrostomy exit site was prepped with chlorhexidine. A 5 French catheter was advanced through the skin opening. Contrast was then injected under fluoroscopy. A guidewire was advanced into the gastric lumen. An 64 French balloon retention gastrostomy tube was then advanced over the wire and into the stomach. The retention balloon was inflated with 7 mL of saline. The gastrostomy tube was injected with contrast to confirm position and a fluoroscopic image saved. FINDINGS: The tract from the skin into the gastric lumen was able to be catheterized, allowing placement of a balloon retention catheter. After placement, the tip of the catheter lies in the body of the stomach. The catheter may be used immediately. IMPRESSION: Replacement of gastrostomy with ability to recanalize the recent gastrostomy tube tract and place an 33 French balloon retention  catheter into the gastric lumen. Electronically Signed   By: Aletta Edouard M.D.   On: 09/04/2018 15:36   [place] Ekg Site Rite  Result Date: 08/25/2018 If Site Rite image not attached, placement could not be confirmed due to current cardiac rhythm.  US Abdomen Limited Ruq  Result Date: 08/24/2018 CLINICAL DATA:  History of hepatitis EXAM: ULTRASOUND ABDOMEN LIMITED RIGHT UPPER QUADRANT COMPARISON:  Abdominal and pelvic CT scan of August 11, 2018 FINDINGS: The study is limited due to the patient's clinical status. Gallbladder: No gallstones or wall thickening visualized. No sonographic Murphy sign noted  by sonographer. Common bile duct: Diameter: 1.9 mm Liver: No focal lesion identified. Within normal limits in parenchymal echogenicity. No intrahepatic ductal dilation. The surface contour is smooth. Portal vein is patent on color Doppler imaging with normal direction of blood flow towards the liver. IMPRESSION: Normal limited right upper quadrant abdominal ultrasound examination. Electronically Signed   By: David  Martinique M.D.   On: 08/24/2018 09:33    Consults:    Subjective:    Overnight Issues: No significant events noted overnight.  Patient was successfully extubated during the day.  Has had issues with secretion clearance.   Objective:  Vital signs for last 24 hours: Temp:  [96.8 F (36 C)-100 F (37.8 C)] 98.1 F (36.7 C) (02/01 0700) Pulse Rate:  [79-108] 89 (02/01 0700) Resp:  [13-28] 15 (02/01 0700) BP: (96-163)/(53-111) 114/93 (02/01 0700) SpO2:  [91 %-100 %] 98 % (02/01 0700) FiO2 (%):  [30 %-35 %] 30 % (01/31 1100) Weight:  [61 kg] 61 kg (02/01 0436)  Hemodynamic parameters for last 24 hours:    Intake/Output from previous day: 01/31 0701 - 02/01 0700 In: 145.3 [I.V.:45.3; IV Piggyback:100] Out: 1500 [Urine:1500]  Intake/Output this shift: No intake/output data recorded.  Vent settings for last 24 hours: Vent Mode: CPAP;PSV FiO2 (%):  [30 %-35 %] 30 % Set Rate:  [16  bmp] 16 bmp Vt Set:  [450 mL] 450 mL PEEP:  [5 cmH20] 5 cmH20 Pressure Support:  [5 cmH20] 5 cmH20  Physical Exam:  Chronically ill-appearing elderly male HEENT: Trachea midline, no thyromegaly noted, no accessory muscle utilization Cardiovascular: Regular rate and rhythm Pulmonary: Central secretions appreciated, clear to auscultation peripherally Abdominal: Positive bowel sounds, PEG tube in place, soft exam Extremities: No clubbing, cyanosis or edema noted Neurologic: Patient has been responsive and moves all extremities Cutaneous: No rashes or lesions noted  Assessment/Plan:   Respiratory failure secondary to aspiration.  Patient is presently on Unasyn.  He was successfully extubated yesterday.  Still with a high risk for aspiration, on bronchodilators and suction as needed for secretion clearance.  Chest x-ray reveals bibasilar infiltrates/atelectasis most likely consistent with chronic aspiration.  Cultures negative thus far, urinary antigen studies also negative  Prior history of dementia.  CT of head performed on the 28th   No acute intracranial process.  Stable moderate parenchymal brain volume loss, advanced for age.  Old small RIGHT cerebellar infarct and mild chronic small vessel  ischemic changes.  Hypokalemia.  Will replace  Hyperglycemia.  On scale insulin coverage  Anemia.  Mild, no evidence of active bleeding  Mackenzie Lia 09/23/2018  *Care during the described time interval was provided by me and/or other providers on the critical care team.  I have reviewed this patient's available data, including medical history, events of note, physical examination and test results as part of my evaluation. Patient ID: Maurice Patterson, male   DOB: 07-06-1956, 63 y.o.   MRN: [id number]

## 2018-09-23 NOTE — Progress Notes (Signed)
Brief Nutrition Note  Consult received for enteral/tube feeding initiation and management.  Adult Enteral Nutrition Protocol initiated. Full assessment to follow.  Admitting Dx: Altered mental status, unspecified altered mental status type [R41.82] Aspiration pneumonia, unspecified aspiration pneumonia type, unspecified laterality, unspecified part of lung ([place]) [J69.0] Sepsis, due to unspecified organism, unspecified whether acute organ dysfunction present ([place]) [A41.9]   RD saw pt 1/29 and recommended:  Vital 1.5 Cal at 40 mL/hr (960 mL goal daily volume) + Pro-Stat 30 mL TID per G-tube. Provides 1740 kcal, 110 grams of protein, 730 mL H2O daily.  Will order   Labs:  Recent Labs  Lab 09/21/18 0507 09/22/18 0423 09/23/18 0439  NA 135 137 138  K 3.7 3.6 3.4*  CL 106 103 102  CO2 25 29 29   BUN 13 14 9   CREATININE 0.37* 0.42* 0.43*  CALCIUM 7.9* 8.0* 8.4*  MG 1.8 2.1  --   PHOS 2.7  --   --   GLUCOSE 190* 208* 101*    Burtis Junes RD, LDN, CNSC Clinical Nutrition Available Tues-Sat via Pager: [phone] 09/23/2018 1:25 PM

## 2018-09-23 NOTE — Progress Notes (Signed)
Pharmacy Consult for Unasyn Dosing.  Indication: Pneumonia.  Pharmacy Antibiotic Note  Maurice Patterson is a 63 y.o. male admitted on 09/19/2018 with sepsis secondary to aspiration pneumonia from [facility]. Pt was in ICU 2 weeks ago for aspiration pneumonia. Is currently admitted back into ICU for aspiration pneumonia. PMH consists of acute MI, CAD, CHF, CKD, dementia, DM, GERD, hyperlipidemia. Pt is currently intubated.   Pharmacy has been consulted for Unasyn dosing.  Plan: Continue Unasyn 3g IV q6h  Monitor CBC, renal function, and hepatic function.   Height: 5\' 6"  (167.6 cm) Weight: 130 lb 1.1 oz (59 kg) IBW/kg (Calculated) : 63.8  Temp (24hrs), Avg:100 F (37.8 C), Min:99.3 F (37.4 C), Max:101.1 F (38.4 C)  Recent Labs  Lab 09/19/18 2003 09/19/18 2306 09/20/18 0449 09/21/18 0507  WBC 12.7*  --  9.4 8.7  CREATININE 0.53*  --  0.50* 0.37*  LATICACIDVEN 2.6* 1.6  --   --     Estimated Creatinine Clearance: 80.9 mL/min (A) (by C-G formula based on SCr of 0.37 mg/dL (L)).    Allergies  Allergen Reactions  . Hydrocodone Anaphylaxis  . Morphine Other (See Comments)    Loss of memory  . Ambien [Zolpidem] Other (See Comments)    delirium    . Brilinta [Ticagrelor] Other (See Comments)    Stroke   . Flexeril [Cyclobenzaprine] Other (See Comments)    delerium   . Flunitrazepam Other (See Comments)    ROHYPNOL (hallucinations)  . Haldol [Haloperidol Lactate] Other (See Comments)    delerium   . Levetiracetam Diarrhea and Other (See Comments)    Unable to walk  . Lorazepam Hives  . Risperdal [Risperidone] Other (See Comments)    Delirium   . Trazodone Other (See Comments)    Delirium Can take in low doses   . Benadryl [Diphenhydramine Hcl (Sleep)] Rash  . Penicillins Rash    Mouth ulcers Has patient had a PCN reaction causing immediate rash, facial/tongue/throat swelling, SOB or lightheadedness with hypotension: Yes Has patient had a PCN reaction causing  severe rash involving mucus membranes or skin necrosis: No Has patient had a PCN reaction that required hospitalization No Has patient had a PCN reaction occurring within the last 10 years: Yes If all of the above answers are "NO", then may proceed with Cephalosporin use.  . Vancomycin Rash    Rash around IV site during vancomycin infusion    Antimicrobials this admission: 1/31 Unasyn >> 1/29 Clindamycin >> 1/30 1/29 Cefepime >> 1/29 1/29 Vancomycin x 1 dose  Microbiology results: 1/28 BCx: pending, no growth 4 days 1/28 UCx: no growth. 1/28 Sputum: MSSA Resistant to erythromycin and  Clindamycin.  1/28 MRSA PCR: negative   Thank you for allowing pharmacy to be a part of this patient's care.  Rayna Sexton, PharmD, BCPS Clinical Pharmacist 09/23/2018 7:36 AM

## 2018-09-23 NOTE — Evaluation (Signed)
Physical Therapy Evaluation Patient Details Name: Maurice Patterson MRN: [id number] DOB: 04/26/1956 Today's Date: 09/23/2018   History of Present Illness  63 y.o. male. admitted with Acute Hypoxic Respiratory Failure in setting of Aspiration, Sepsis, and Altered Mental Status.  He required intubation in the ED, extubated 09/22/2018. Peg tube placed. Old small RIGHT cerebellar infarct and mild chronic small vessel ischemic changes found on workup. PMH consists of acute MI, CAD, CHF, CKD, dementia, DM, GERD, hyperlipidemia and recent hospital admissions.    Clinical Impression  Patient initially alert, able to state his name, birthday and location, denied pain. Over time with session, pt became increasingly lethargic. Unable to provide PLOF information, no family at beside. Able to participate in LE and UE therapeutic exercises with consistent repetitive simple commands, LLE stronger (AROM with tactile cues) than R (AAROM), AAROM for all UE movement. Supine <> sit with maxAx2 for safety, unable to maintain balance without maxAx1 at EOB despite multimodal cues and feet supported. Overall the patient demonstrated deficits (see "PT Problem List") that impede the patient's functional abilities, safety, and mobility and would benefit from skilled PT intervention.      Follow Up Recommendations SNF    Equipment Recommendations  None recommended by PT    Recommendations for Other Services OT consult;Speech consult     Precautions / Restrictions Precautions Precautions: Fall Restrictions Weight Bearing Restrictions: No      Mobility  Bed Mobility Overal bed mobility: Needs Assistance Bed Mobility: Supine to Sit;Sit to Supine     Supine to sit: Max assist;+2 for physical assistance Sit to supine: Max assist;+2 for physical assistance   General bed mobility comments: supine to sit with maxAx2, unable to maitain sitting without max support, but occasionally able to attempt to  participate.  Transfers                 General transfer comment: deferred, unable to maintain sitting, follow commands  Ambulation/Gait                Stairs            Wheelchair Mobility    Modified Rankin (Stroke Patients Only)       Balance Overall balance assessment: Needs assistance Sitting-balance support: Feet supported Sitting balance-Leahy Scale: Zero Sitting balance - Comments: Pt needed continuous max assist to maintain sitting, but did show attempts to assist                                     Pertinent Vitals/Pain Pain Assessment: Faces Faces Pain Scale: No hurt    Home Living                   Additional Comments: Patient unable to provide PLOF, poor historian, mumbled speech    Prior Function                 Hand Dominance        Extremity/Trunk Assessment   Upper Extremity Assessment Upper Extremity Assessment: Generalized weakness(AAROM for UE movement)    Lower Extremity Assessment Lower Extremity Assessment: Generalized weakness(tactile cues to move LE, able to lift bilateral LE off bed, bend bilateral knees AAROM, wiggle toes. )       Communication      Cognition Arousal/Alertness: Lethargic Behavior During Therapy: Flat affect Overall Cognitive Status: No family/caregiver present to determine baseline cognitive functioning Area of Impairment: Orientation;Following commands;Awareness;Safety/judgement  Orientation Level: Disoriented to;Situation     Following Commands: Follows one step commands inconsistently Safety/Judgement: Decreased awareness of safety;Decreased awareness of deficits     General Comments: Pt flucuating between alert/lethargic, able to follow 1 step commands inconsistently, able to verbalize his name and that he is in a hospital at start of session. Increasingly lethargic as session progressed.      General Comments      Exercises  General Exercises - Lower Extremity Heel Slides: AAROM;10 reps;Both Straight Leg Raises: 10 reps;AROM;Left;AAROM;Right   Assessment/Plan    PT Assessment Patient needs continued PT services  PT Problem List Decreased strength;Decreased range of motion;Decreased activity tolerance;Decreased balance;Decreased coordination;Decreased mobility;Decreased knowledge of use of DME;Decreased safety awareness;Decreased cognition       PT Treatment Interventions DME instruction;Gait training;Stair training;Functional mobility training;Therapeutic activities;Balance training;Therapeutic exercise;Neuromuscular re-education;Cognitive remediation;Patient/family education    PT Goals (Current goals can be found in the Care Plan section)  Acute Rehab PT Goals PT Goal Formulation: Patient unable to participate in goal setting    Frequency Min 2X/week   Barriers to discharge        Co-evaluation               AM-PAC PT "6 Clicks" Mobility  Outcome Measure Help needed turning from your back to your side while in a flat bed without using bedrails?: Total Help needed moving from lying on your back to sitting on the side of a flat bed without using bedrails?: Total Help needed moving to and from a bed to a chair (including a wheelchair)?: Total Help needed standing up from a chair using your arms (e.g., wheelchair or bedside chair)?: Total Help needed to walk in hospital room?: Total Help needed climbing 3-5 steps with a railing? : Total 6 Click Score: 6    End of Session   Activity Tolerance: Patient limited by lethargy Patient left: in bed;with bed alarm set;with nursing/sitter in room;with call bell/phone within reach Nurse Communication: Mobility status PT Visit Diagnosis: Other abnormalities of gait and mobility (R26.89);Muscle weakness (generalized) (M62.81)    Time: 1497-0263 PT Time Calculation (min) (ACUTE ONLY): 18 min   Charges:   PT Evaluation $PT Eval Moderate Complexity: 1  Mod          Lieutenant Diego PT, DPT 1:43 PM,09/23/18 [phone]

## 2018-09-23 NOTE — Progress Notes (Signed)
Pharmacy Electrolyte Monitoring Consult:  Pharmacy consulted to assist in monitoring and replacing electrolytes in this 63 y.o. male admitted on 09/19/2018 with Respiratory Distress   Labs:  Sodium (mmol/L)  Date Value  09/23/2018 138  10/20/2016 146 (H)  12/17/2014 138   Potassium (mmol/L)  Date Value  09/23/2018 3.4 (L)  12/17/2014 3.7   Magnesium (mg/dL)  Date Value  09/22/2018 2.1  12/17/2014 1.8   Phosphorus (mg/dL)  Date Value  09/21/2018 2.7   Calcium (mg/dL)  Date Value  09/23/2018 8.4 (L)   Calcium, Total (mg/dL)  Date Value  12/17/2014 7.9 (L)   Albumin (g/dL)  Date Value  09/19/2018 3.6  10/20/2016 4.5  12/17/2014 3.2 (L)    Assessment/Plan: K 3.4, NP has already ordered potassium 76mEq VT x 1.   Will order follow up BMP and magnesium with am labs.   Will replace for goal potassium ~ 4 and goal magnesium ~ 2.   Pharmacy will continue to monitor and adjust per consult.   Maurice Patterson 09/23/2018 7:36 AM

## 2018-09-23 NOTE — Progress Notes (Signed)
Patient ID: Maurice Patterson, male   DOB: 1955-12-29, 63 y.o.   MRN: [id number]  ACP note  Wife at the bedside.  Diagnosis: Acute hypoxic respiratory failure, aspiration pneumonia and clinical sepsis, type 2 diabetes mellitus, severe malnutrition, history of dementia, gastric ulceration, deep tissue injury on sacrum  Patient full code.  I explained to the patient's wife that having a feeding tube does not decrease the patient's risk of aspiration.  Risk of aspiration is actually higher.  She states that the facility will now do bolus feeds at the facility.  Sitting up and moving around is actually very helpful.  High risk of aspiration if he is lying flat in the bed.  Time spent on ACP discussion 17 minutes Dr. Loletha Grayer

## 2018-09-23 NOTE — Progress Notes (Signed)
Pt currently not appropriate for swallowing eval. Pt has PEG tube which Nsg reports they will try to restart soon and assess for tolerance. Pt is not able to clear secretions and would be high risk for aspiration at this time. F/u 2-3 days.

## 2018-09-24 LAB — GLUCOSE, CAPILLARY
GLUCOSE-CAPILLARY: 140 mg/dL — AB (ref 70–99)
Glucose-Capillary: 178 mg/dL — ABNORMAL HIGH (ref 70–99)
Glucose-Capillary: 138 mg/dL — ABNORMAL HIGH (ref 70–99)
Glucose-Capillary: 225 mg/dL — ABNORMAL HIGH (ref 70–99)
Glucose-Capillary: 189 mg/dL — ABNORMAL HIGH (ref 70–99)
Glucose-Capillary: 82 mg/dL (ref 70–99)

## 2018-09-24 LAB — CULTURE, BLOOD (ROUTINE X 2)
Culture: NO GROWTH
Culture: NO GROWTH
SPECIAL REQUESTS: ADEQUATE

## 2018-09-24 LAB — BASIC METABOLIC PANEL
Anion gap: 6 (ref 5–15)
BUN: 11 mg/dL (ref 8–23)
CO2: 27 mmol/L (ref 22–32)
Calcium: 8.6 mg/dL — ABNORMAL LOW (ref 8.9–10.3)
Chloride: 106 mmol/L (ref 98–111)
Creatinine, Ser: 0.38 mg/dL — ABNORMAL LOW (ref 0.61–1.24)
GFR calc Af Amer: >60 mL/min (ref >60)
GFR calc non Af Amer: >60 mL/min (ref >60)
Glucose, Bld: 152 mg/dL — ABNORMAL HIGH (ref 70–99)
Potassium: 3.5 mmol/L (ref 3.5–5.1)
Sodium: 139 mmol/L (ref 135–145)

## 2018-09-24 LAB — MAGNESIUM: Magnesium: 1.9 mg/dL (ref 1.7–2.4)

## 2018-09-24 LAB — PHOSPHORUS: Phosphorus: 3.2 mg/dL (ref 2.5–4.6)

## 2018-09-24 MED ORDER — POTASSIUM CHLORIDE 20 MEQ PO PACK
40.0000 meq | PACK | Freq: Once | ORAL | Status: AC
  Administered 2018-09-24: 40 meq
  Filled 2018-09-24: qty 2

## 2018-09-24 MED ORDER — LIDOCAINE 5 % EX PTCH
1.0000 | MEDICATED_PATCH | CUTANEOUS | Status: DC
  Administered 2018-09-24 – 2018-09-28 (×4): 1 via TRANSDERMAL
  Filled 2018-09-24 (×6): qty 1

## 2018-09-24 MED ORDER — FREE WATER
180.0000 mL | Status: DC
  Administered 2018-09-24 – 2018-09-26 (×15): 180 mL

## 2018-09-24 MED ORDER — GLUCERNA 1.5 CAL PO LIQD
1000.0000 mL | ORAL | Status: DC
  Administered 2018-09-24 – 2018-09-25 (×2): 1000 mL

## 2018-09-24 NOTE — Progress Notes (Signed)
Pharmacy Electrolyte Monitoring Consult:  Pharmacy consulted to assist in monitoring and replacing electrolytes in this 63 y.o. male admitted on 09/19/2018 with Respiratory Distress   Labs:  Sodium (mmol/L)  Date Value  09/24/2018 139  10/20/2016 146 (H)  12/17/2014 138   Potassium (mmol/L)  Date Value  09/24/2018 3.5  12/17/2014 3.7   Magnesium (mg/dL)  Date Value  09/24/2018 1.9  12/17/2014 1.8   Phosphorus (mg/dL)  Date Value  09/24/2018 3.2   Calcium (mg/dL)  Date Value  09/24/2018 8.6 (L)   Calcium, Total (mg/dL)  Date Value  12/17/2014 7.9 (L)   Albumin (g/dL)  Date Value  09/19/2018 3.6  10/20/2016 4.5  12/17/2014 3.2 (L)    Assessment/Plan: K 3.5, will order potassium 22mEq VT x 1.   Will order follow up BMP and magnesium with am labs.   Will replace for goal potassium ~ 4 and goal magnesium ~ 2.   Pharmacy will continue to monitor and adjust per consult.   Paulina Fusi, PharmD, BCPS 09/24/2018 7:40 AM

## 2018-09-24 NOTE — Clinical Social Work Note (Addendum)
The CSW received a consult to rescind the original authorization request and to begin a new request. CSW has left a voicemail on the on-call number for the weekend for [employer]. The CSW will update as more information becomes available.  UPDATE: CSW spoke with Colletta Maryland with [employer]. She has withdrawn the original auth request and indicated that a new request cannot begin until the patient has been reevaluated by PT/OT/SLP. The CSW updated the attending MD. CSW is following.  Santiago Bumpers, MSW, Latanya Presser [phone]

## 2018-09-24 NOTE — Progress Notes (Signed)
Nutrition Follow-up  DOCUMENTATION CODES:   Severe malnutrition in context of acute illness/injury  INTERVENTION:  Initiate new goal TF regimen of Glucerna 1.5 Cal at 55 mL/hr (1320 mL goal daily volume) per tube. Provides 1980 kcal, 109 grams of protein, 1003 mL H2O daily.  Provide free water flush of 180 mL Q4hrs for a total of 2083 mL H2O daily.  New goal TF regimen meets 100% RDIs for vitamins/minerals. Will discontinue liquid MVI.  NUTRITION DIAGNOSIS:   Severe Malnutrition related to acute illness(dysphagia s/p placement of G-tube for tube feeding, catabolic nature of critical illness, catabolic nature of CHF and CKD) as evidenced by moderate fat depletion, moderate muscle depletion, percent weight loss.  Ongoing - addressing with TF regimen.  GOAL:   Provide needs based on ASPEN/SCCM guidelines  Met with TF regimen.  MONITOR:   Vent status, Labs, Weight trends, TF tolerance, Skin, I & O's  REASON FOR ASSESSMENT:   Ventilator    ASSESSMENT:   63 year old male with PMHx of seizures, dementia, arthritis, HTN, DM, malignant intraductal papillary mucinous tumor of pancreas, hernia of abdominal cavity, hx HSV encephalitis,  hx MI, hx TIA, CAD, CHF, GERD, pancreatitis, CKD, recent admission 07/22/2018-09/08/2018 for acute encephalopathy, acute hypoxic respiratory failure requiring intubation 12/7-12/11 and 12/17-12/23, and dysphagia s/p placement of G-tube who now presents from [facility] with severe hypoxic and hypercapnic respiratory from aspiration PNA requiring intubation on 1/28, also with questionable G-tube malposition.   -GI assessed patient on 1/29. Gastrografin tube study showed gastrostomy tube was in place in gastric cavity so tube feeds were initiated. -Patient was extubated on 1/31.  Patient seen this morning in room. No family members present at time of RD assessment. Patient's bed at appropriate angle, but patient had slid down sideways in his bed, which  he does often and requires repositioning. Noted in note from yesterday wife had requested bolus tube feeding. Patient has difficulty tolerating bolus tube feeding and last admission vomited after going up to a higher rate for nocturnal feeds (which is not even as high of a rate as bolus feeding would be). Bolus feeds also greatly increase risk for aspiration. Safest plan to reduce aspiration risk is to keep patient on continuous tube feeds and try to keep him as well-positioned with HOB >30-45 degrees. Presence of G-tube does not reduce risk of aspiration.  Access: 18 Fr. G-tube present on admission; terminates in stomach per Gastrografin study 1/29  TF: pt tolerating goal regimen of Vital 1.5 at 40 mL/hr + Pro-Stat 30 mL TID  Medications reviewed and include: cholecalciferol 1000 units daily, Novolog 0-15 units Q4hrs, Novolog 3 units Q4hrs, Lantus 4 units QHS, liquid MVI daily, Unasyn, famotidine.  Labs reviewed: CBG 127-178, Creatinine 0.38. Potassium, Phosphorus, and Magnesium WNL.  Discussed with RN.  Diet Order:   Diet Order            Diet NPO time specified  Diet effective now             EDUCATION NEEDS:   Not appropriate for education at this time  Skin:  Skin Assessment: Skin Integrity Issues:(MSAD to buttocks, coccyx; ecchymosis to bilateral arms)  Last BM:  09/24/2018 - large type 5  Height:   Ht Readings from Last 1 Encounters:  09/19/18 '5\' 6"'$  (1.676 m)   Weight:   Wt Readings from Last 1 Encounters:  09/24/18 57.2 kg   Ideal Body Weight:  64.5 kg  BMI:  Body mass index is 20.35 kg/m.  Estimated Nutritional Needs:   Kcal:  1755-2025 (MSJ x 1.3-1.5)  Protein:  90-108 grams (1.5-1.8 grams/kg)  Fluid:  1.8 L/day (30 mL/kg)  Willey Blade, MS, RD, LDN Office: [phone] Pager: [phone] After Hours/Weekend Pager: [phone]

## 2018-09-24 NOTE — Progress Notes (Signed)
Pharmacy Consult for Unasyn Dosing.  Indication: Pneumonia.  Pharmacy Antibiotic Note  Maurice Patterson is a 63 y.o. male admitted on 09/19/2018 with sepsis secondary to aspiration pneumonia from [facility]. Pt was in ICU 2 weeks ago for aspiration pneumonia. Is currently admitted back into ICU for aspiration pneumonia. PMH consists of acute MI, CAD, CHF, CKD, dementia, DM, GERD, hyperlipidemia. Pt is currently intubated.   Pharmacy has been consulted for Unasyn dosing.  Plan: Continue Unasyn 3g IV q6h  Monitor CBC, renal function, and hepatic function.   Height: 5\' 6"  (167.6 cm) Weight: 130 lb 1.1 oz (59 kg) IBW/kg (Calculated) : 63.8  Temp (24hrs), Avg:100 F (37.8 C), Min:99.3 F (37.4 C), Max:101.1 F (38.4 C)  Recent Labs  Lab 09/19/18 2003 09/19/18 2306 09/20/18 0449 09/21/18 0507  WBC 12.7*  --  9.4 8.7  CREATININE 0.53*  --  0.50* 0.37*  LATICACIDVEN 2.6* 1.6  --   --     Estimated Creatinine Clearance: 80.9 mL/min (A) (by C-G formula based on SCr of 0.37 mg/dL (L)).    Allergies  Allergen Reactions  . Hydrocodone Anaphylaxis  . Morphine Other (See Comments)    Loss of memory  . Ambien [Zolpidem] Other (See Comments)    delirium    . Brilinta [Ticagrelor] Other (See Comments)    Stroke   . Flexeril [Cyclobenzaprine] Other (See Comments)    delerium   . Flunitrazepam Other (See Comments)    ROHYPNOL (hallucinations)  . Haldol [Haloperidol Lactate] Other (See Comments)    delerium   . Levetiracetam Diarrhea and Other (See Comments)    Unable to walk  . Lorazepam Hives  . Risperdal [Risperidone] Other (See Comments)    Delirium   . Trazodone Other (See Comments)    Delirium Can take in low doses   . Benadryl [Diphenhydramine Hcl (Sleep)] Rash  . Penicillins Rash    Mouth ulcers Has patient had a PCN reaction causing immediate rash, facial/tongue/throat swelling, SOB or lightheadedness with hypotension: Yes Has patient had a PCN reaction causing  severe rash involving mucus membranes or skin necrosis: No Has patient had a PCN reaction that required hospitalization No Has patient had a PCN reaction occurring within the last 10 years: Yes If all of the above answers are "NO", then may proceed with Cephalosporin use.  . Vancomycin Rash    Rash around IV site during vancomycin infusion    Antimicrobials this admission: 1/31 Unasyn >> 1/29 Clindamycin >> 1/30 1/29 Cefepime >> 1/29 1/29 Vancomycin x 1 dose  Microbiology results: 1/28 BCx: no growth 5 days 1/28 UCx: no growth. 1/28 Sputum: MSSA Resistant to erythromycin and  Clindamycin.  1/28 MRSA PCR: negative   Thank you for allowing pharmacy to be a part of this patient's care.  Paulina Fusi, PharmD, BCPS 09/24/2018 7:41 AM

## 2018-09-24 NOTE — Progress Notes (Signed)
Patient ID: Maurice Patterson, male   DOB: 09-Mar-1956, 63 y.o.   MRN: [id number]  [facility] PROGRESS NOTE  Maurice Patterson BUL:[id number] DOB: 1956/03/20 DOA: 09/19/2018 PCP: [name], MD  HPI/Subjective: Patient was helped by myself and nursing up in the bed.  Patient had one leg off the bed.  Patient tried to talk but I could not hear him today.  Objective: Vitals:   09/24/18 1113 09/24/18 1150  BP: 135/88   Pulse: 91 93  Resp: 17 18  Temp:  97.8 F (36.6 C)  SpO2: 96% 96%    Filed Weights   09/22/18 0436 09/23/18 0436 09/24/18 0458  Weight: 62.6 kg 61 kg 57.2 kg    ROS: Review of Systems  Unable to perform ROS: Acuity of condition   Exam: Physical Exam  HENT:  Nose: No mucosal edema.  Mouth/Throat: No oropharyngeal exudate.  Eyes: Pupils are equal, round, and reactive to light. Conjunctivae and lids are normal.  Neck: Carotid bruit is not present. No thyroid mass present.  Cardiovascular: Regular rhythm, S1 normal, S2 normal and normal heart sounds.  Respiratory: He has no decreased breath sounds. He has no wheezes. He has no rhonchi. He has no rales.  GI: Soft. Bowel sounds are normal. There is no abdominal tenderness.  Musculoskeletal:     Right ankle: He exhibits no swelling.     Left ankle: He exhibits no swelling.  Neurological: He is alert.  Follows commands.  Able to lift his arms and legs up off the bed to command.  Skin: Skin is warm. Nails show no clubbing.  As per nurse, deep tissue injury purpleish hue on sacrum  Psychiatric:  Able to follow commands today.      Data Reviewed: Basic Metabolic Panel: Recent Labs  Lab 09/20/18 0449 09/21/18 0507 09/22/18 0423 09/23/18 0439 09/23/18 1349 09/24/18 0427 09/24/18 0428  NA 136 135 137 138  --   --  139  K 3.9 3.7 3.6 3.4*  --   --  3.5  CL 106 106 103 102  --   --  106  CO2 '25 25 29 29  '$ --   --  27  GLUCOSE 92 190* 208* 101*  --   --  152*  BUN '12 13 14 9  '$ --   --  11   CREATININE 0.50* 0.37* 0.42* 0.43*  --   --  0.38*  CALCIUM 7.8* 7.9* 8.0* 8.4*  --   --  8.6*  MG  --  1.8 2.1  --   --   --  1.9  PHOS  --  2.7  --   --  2.6 3.2  --    Liver Function Tests: Recent Labs  Lab 09/19/18 2003  AST 23  ALT 43  ALKPHOS 180*  BILITOT 0.7  PROT 6.8  ALBUMIN 3.6   CBC: Recent Labs  Lab 09/19/18 2003 09/20/18 0449 09/21/18 0507 09/22/18 0423  WBC 12.7* 9.4 8.7 7.3  NEUTROABS 9.6*  --   --   --   HGB 14.2 12.5* 10.8* 10.0*  HCT 42.7 39.8 33.3* 31.4*  MCV 91.4 96.6 96.0 96.0  PLT 215 164 130* 139*   Cardiac Enzymes: Recent Labs  Lab 09/19/18 2003  TROPONINI <0.03   BNP (last 3 results) Recent Labs    07/22/18 2239 08/03/18 1120  BNP 40.0 95.0     CBG: Recent Labs  Lab 09/23/18 1929 09/23/18 2349 09/24/18 0339 09/24/18 0802 09/24/18 1135  GLUCAP  204* 127* 140* 178* 138*    Recent Results (from the past 240 hour(s))  Culture, respiratory (non-expectorated)     Status: None   Collection Time: 09/19/18  1:14 AM  Result Value Ref Range Status   Specimen Description TRACHEAL ASPIRATE  Final   Special Requests Normal  Final   Gram Stain   Final    FEW WBC PRESENT, PREDOMINANTLY PMN FEW GRAM POSITIVE COCCI IN PAIRS IN CLUSTERS RARE GRAM POSITIVE RODS RARE GRAM NEGATIVE RODS    Culture FEW STAPHYLOCOCCUS AUREUS  Final   Report Status 09/22/2018 FINAL  Final   Organism ID, Bacteria STAPHYLOCOCCUS AUREUS  Final      Susceptibility   Staphylococcus aureus - MIC*    CIPROFLOXACIN <=0.5 SENSITIVE Sensitive     ERYTHROMYCIN >=8 RESISTANT Resistant     GENTAMICIN <=0.5 SENSITIVE Sensitive     OXACILLIN 0.5 SENSITIVE Sensitive     TETRACYCLINE <=1 SENSITIVE Sensitive     VANCOMYCIN <=0.5 SENSITIVE Sensitive     TRIMETH/SULFA <=10 SENSITIVE Sensitive     CLINDAMYCIN RESISTANT Resistant     RIFAMPIN <=0.5 SENSITIVE Sensitive     Inducible Clindamycin POSITIVE Resistant     * FEW STAPHYLOCOCCUS AUREUS  Blood Culture (routine x  2)     Status: None   Collection Time: 09/19/18  8:03 PM  Result Value Ref Range Status   Specimen Description BLOOD BLOOD RIGHT FOREARM  Final   Special Requests   Final    BOTTLES DRAWN AEROBIC AND ANAEROBIC Blood Culture results may not be optimal due to an excessive volume of blood received in culture bottles   Culture   Final    NO GROWTH 5 DAYS Performed at [facility], [address] [place], [place] [address]    Report Status 09/24/2018 FINAL  Final  Blood Culture (routine x 2)     Status: None   Collection Time: 09/19/18  8:03 PM  Result Value Ref Range Status   Specimen Description BLOOD LEFT ANTECUBITAL  Final   Special Requests   Final    BOTTLES DRAWN AEROBIC AND ANAEROBIC Blood Culture adequate volume   Culture   Final    NO GROWTH 5 DAYS Performed at [facility], [address], [place], [place] [address]    Report Status 09/24/2018 FINAL  Final  Urine culture     Status: None   Collection Time: 09/19/18  8:03 PM  Result Value Ref Range Status   Specimen Description   Final    URINE, RANDOM Performed at [facility], [address], [place], [place] [address]    Special Requests   Final    NONE Performed at [facility], [address], [place], [place] [address]    Culture   Final    NO GROWTH Performed at [facility], [place] [address] [place], [place] [address]    Report Status 09/21/2018 FINAL  Final  MRSA PCR Screening     Status: None   Collection Time: 09/19/18 10:52 PM  Result Value Ref Range Status   MRSA by PCR NEGATIVE NEGATIVE Final    Comment:        The GeneXpert MRSA Assay (FDA approved for NASAL specimens only), is one component of a comprehensive MRSA colonization surveillance program. It is not intended to diagnose MRSA infection nor to guide or monitor treatment for MRSA infections. Performed at [facility], [address] [place], [place] [address]  Scheduled  Meds: . aspirin  81 mg Oral Daily  . chlorhexidine gluconate (MEDLINE KIT)  15 mL Mouth Rinse BID  . cholecalciferol  1,000 Units Oral Daily  . clopidogrel  75 mg Oral Daily  . donepezil  10 mg Oral QHS  . enoxaparin (LOVENOX) injection  40 mg Subcutaneous Q24H  . free water  180 mL Per Tube Q4H  . insulin aspart  0-15 Units Subcutaneous Q4H  . insulin aspart  3 Units Subcutaneous Q4H  . insulin glargine  4 Units Subcutaneous QHS  . lidocaine  1 patch Transdermal Q24H  . memantine  10 mg Oral Daily   Continuous Infusions: . ampicillin-sulbactam (UNASYN) IV 3 g (09/24/18 1116)  . famotidine (PEPCID) IV 20 mg (09/23/18 1438)  . feeding supplement (GLUCERNA 1.5 CAL) 1,000 mL (09/24/18 1142)    Assessment/Plan:   1. Acute hypoxic respiratory failure secondary to aspiration pneumonia.  Patient extubated 2 days ago.  Currently breathing on room air. 2. Clinical sepsis due to aspiration pneumonia.  Patient on IV Unasyn.  Culture growing out sensitive staph aureus. 3. Type 2 diabetes mellitus on low-dose glargine insulin and aspart insulin. 4. Severe malnutrition on PEG tube feedings.  Spoke with nutritionist and she mentioned that continuous tube feeds have less of an aspiration risk than the bolus feeds because there is less fluid at a time in the stomach. 5. History of dementia on Namenda and Aricept 6. Gastric ulceration on Pepcid 7. Deep tissue injury with purplish discoloration on buttock  Code Status:     Code Status Orders  (From admission, onward)         Start     Ordered   09/19/18 2130  Full code  Continuous     09/19/18 2129        Code Status History    Date Active Date Inactive Code Status Order ID Comments User Context   07/23/2018 0122 09/08/2018 2029 Full Code [id number]  Lance Coon, MD Inpatient   04/05/2016 0304 04/05/2016 1851 Full Code [id number]  Harvie Bridge, DO Inpatient   10/19/2015 0103 10/20/2015 1102 Full Code [id number]  Harrie Foreman, MD  Inpatient   08/21/2015 1912 08/23/2015 1657 Full Code [id number]  Gladstone Lighter, MD Inpatient   02/19/2015 1627 02/20/2015 2054 Full Code [id number]  Henreitta Leber, MD Inpatient     Family Communication: Wife on the phone Disposition Plan: Awaiting physical therapy and speech therapy notes prior to submitting authorization to go out to rehab again.  Consultants:  Critical care specialist  Speech therapy  Antibiotics:  Unasyn  Time spent: 28 minutes including speaking with the wife on the phone.  Peer-to-peer done with Dr. Amalia Hailey from [place] about authorization to rehab.  Tanisha Lutes [employer]
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
## 2018-09-25 ENCOUNTER — Inpatient Hospital Stay: Payer: PPO

## 2018-09-25 LAB — BASIC METABOLIC PANEL
Anion gap: 8 (ref 5–15)
BUN: 9 mg/dL (ref 8–23)
CO2: 26 mmol/L (ref 22–32)
Calcium: 8.7 mg/dL — ABNORMAL LOW (ref 8.9–10.3)
Chloride: 108 mmol/L (ref 98–111)
Creatinine, Ser: 0.36 mg/dL — ABNORMAL LOW (ref 0.61–1.24)
GFR calc Af Amer: >60 mL/min (ref >60)
GFR calc non Af Amer: >60 mL/min (ref >60)
GLUCOSE: 187 mg/dL — AB (ref 70–99)
Potassium: 3.7 mmol/L (ref 3.5–5.1)
Sodium: 142 mmol/L (ref 135–145)

## 2018-09-25 LAB — GLUCOSE, CAPILLARY
GLUCOSE-CAPILLARY: 134 mg/dL — AB (ref 70–99)
Glucose-Capillary: 142 mg/dL — ABNORMAL HIGH (ref 70–99)
Glucose-Capillary: 172 mg/dL — ABNORMAL HIGH (ref 70–99)
Glucose-Capillary: 172 mg/dL — ABNORMAL HIGH (ref 70–99)
Glucose-Capillary: 173 mg/dL — ABNORMAL HIGH (ref 70–99)
Glucose-Capillary: 185 mg/dL — ABNORMAL HIGH (ref 70–99)

## 2018-09-25 LAB — PHOSPHORUS: Phosphorus: 3 mg/dL (ref 2.5–4.6)

## 2018-09-25 LAB — MAGNESIUM: Magnesium: 2 mg/dL (ref 1.7–2.4)

## 2018-09-25 MED ORDER — MEMANTINE HCL 5 MG PO TABS
10.0000 mg | ORAL_TABLET | Freq: Every day | ORAL | Status: DC
  Administered 2018-09-25 – 2018-09-29 (×5): 10 mg
  Filled 2018-09-25 (×3): qty 1
  Filled 2018-09-25: qty 2

## 2018-09-25 MED ORDER — VITAMIN D 25 MCG (1000 UNIT) PO TABS
1000.0000 [IU] | ORAL_TABLET | Freq: Every day | ORAL | Status: DC
  Administered 2018-09-26 – 2018-09-29 (×4): 1000 [IU]
  Filled 2018-09-25: qty 3
  Filled 2018-09-25 (×2): qty 1
  Filled 2018-09-25: qty 3
  Filled 2018-09-25: qty 1

## 2018-09-25 MED ORDER — FAMOTIDINE 20 MG PO TABS
20.0000 mg | ORAL_TABLET | Freq: Every day | ORAL | Status: DC
  Administered 2018-09-25 – 2018-09-29 (×5): 20 mg
  Filled 2018-09-25 (×5): qty 1

## 2018-09-25 MED ORDER — ASPIRIN 81 MG PO CHEW
81.0000 mg | CHEWABLE_TABLET | Freq: Every day | ORAL | Status: DC
  Administered 2018-09-26 – 2018-09-29 (×4): 81 mg
  Filled 2018-09-25 (×4): qty 1

## 2018-09-25 MED ORDER — POTASSIUM CHLORIDE 20 MEQ PO PACK
40.0000 meq | PACK | Freq: Once | ORAL | Status: AC
  Administered 2018-09-25: 40 meq
  Filled 2018-09-25: qty 2

## 2018-09-25 MED ORDER — CLOPIDOGREL BISULFATE 75 MG PO TABS
75.0000 mg | ORAL_TABLET | Freq: Every day | ORAL | Status: DC
  Administered 2018-09-26 – 2018-09-29 (×4): 75 mg
  Filled 2018-09-25 (×4): qty 1

## 2018-09-25 MED ORDER — DONEPEZIL HCL 5 MG PO TABS
10.0000 mg | ORAL_TABLET | Freq: Every day | ORAL | Status: DC
  Administered 2018-09-25 – 2018-09-28 (×4): 10 mg
  Filled 2018-09-25 (×5): qty 2

## 2018-09-25 NOTE — Clinical Social Work Note (Signed)
CSW received authorization from [employer] for patient to return to [facility] when time. Auth number: [id number] and it is good for 7 days. [facility] updated. Shela Leff MSW,LCSW [phone]
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
## 2018-09-25 NOTE — Clinical Social Work Note (Signed)
CSW received call back from patient's wife this morning. She stated that her sister in law is really wanting patient to go to [facility] and stated that the RN CM stated this might be an option.   CSW explained to patient's wife that currently CIR would not be an appropriate level of care as he would not be able to endure 3 hours of intensive therapy at a time. Patient's wife agreed. Patient's wife discussed with CSW further concerns regarding her having to pay a bed hold at [facility]. She also stated she heard from the physician that the insurance company had approved patient yesterday to go to SNF after he had completed the peer to peer. .CSW informed patient's wife that CSW would call [facility] to obtain clarification. Shela Leff MSW,LcSW [phone]

## 2018-09-25 NOTE — Progress Notes (Signed)
Pharmacy Electrolyte Monitoring Consult:  Pharmacy consulted to assist in monitoring and replacing electrolytes in this 63 y.o. male admitted on 09/19/2018 with Respiratory Distress   Labs:  Sodium (mmol/L)  Date Value  09/25/2018 142  10/20/2016 146 (H)  12/17/2014 138   Potassium (mmol/L)  Date Value  09/25/2018 3.7  12/17/2014 3.7   Magnesium (mg/dL)  Date Value  09/25/2018 2.0  12/17/2014 1.8   Phosphorus (mg/dL)  Date Value  09/25/2018 3.0   Calcium (mg/dL)  Date Value  09/25/2018 8.7 (L)   Calcium, Total (mg/dL)  Date Value  12/17/2014 7.9 (L)   Albumin (g/dL)  Date Value  09/19/2018 3.6  10/20/2016 4.5  12/17/2014 3.2 (L)    Assessment/Plan: Potassium 56mEq VT x 1.   Will order follow up BMP on 2/5.   Will replace for goal potassium ~ 4 and goal magnesium ~ 2.   Pharmacy will continue to monitor and adjust per consult.   MLS 09/25/2018 2:26 PM

## 2018-09-25 NOTE — Evaluation (Signed)
Occupational Therapy Evaluation Patient Details Name: Maurice Patterson MRN: [id number] DOB: 06/11/1956 Today's Date: 09/25/2018    History of Present Illness 63 y.o. male. admitted with Acute Hypoxic Respiratory Failure in setting of Aspiration, Sepsis, and Altered Mental Status.  He required intubation in the ED, extubated 09/22/2018. Peg tube placed. Old small RIGHT cerebellar infarct and mild chronic small vessel ischemic changes found on workup. PMH consists of acute MI, CAD, CHF, CKD, dementia, DM, GERD, hyperlipidemia and recent hospital admissions.   Clinical Impression   Pt seen for OT evaluation this date. Prior to hospital admission, pt was at [facility] and per chart review/family report, pt required extensive assist for ADL, has PEG tube, and had recently stood with a harness with therapy at rehab, per family report. Currently pt demonstrates impairments in strength, ROM, balance, coordination, and communication, requiring Max to total assist for ADL. Pt able to follow commands with time and cues, able to initiate muscle activity in BUE/BLE but unable to sustain 2/2 weakness. Pt would benefit from skilled OT to address noted impairments and functional limitations (see below for any additional details) in order to maximize safety (minimize falls, pressure wounds) and independence while minimizing caregiver burden.  Upon hospital discharge, recommend pt discharge to [place].     SNF    Equipment Recommendations  Other (comment)(TBD)    Recommendations for Other Services       Precautions / Restrictions Precautions Precautions: Fall;Other (comment) Precaution Comments: peg tube Restrictions Weight Bearing Restrictions: No      Mobility Bed Mobility Overal bed mobility: Needs Assistance Bed Mobility: Rolling Rolling: +2 for physical assistance;Max assist;+2 for safety/equipment            Transfers                 General transfer comment: deferred due to safety  concerns    Balance Overall balance assessment: Needs assistance                                         ADL either performed or assessed with clinical judgement   ADL Overall ADL's : Needs assistance/impaired Eating/Feeding: NPO   Grooming: Maximal assistance;Bed level   Upper Body Bathing: Bed level;Maximal assistance   Lower Body Bathing: Bed level;Total assistance   Upper Body Dressing : Bed level;Maximal assistance   Lower Body Dressing: Bed level;Total assistance                       Vision Patient Visual Report: No change from baseline Vision Assessment?: No apparent visual deficits     Perception     Praxis      Pertinent Vitals/Pain Pain Assessment: Faces Faces Pain Scale: Hurts a little bit Pain Location: lower back (chronic) Pain Descriptors / Indicators: Aching;Grimacing Pain Intervention(s): Limited activity within patient's tolerance;Monitored during session;Repositioned     Hand Dominance Right   Extremity/Trunk Assessment Upper Extremity Assessment Upper Extremity Assessment: RUE deficits/detail;LUE deficits/detail RUE Deficits / Details: AAROM for all UE movement, grip at least 4-/5 LUE Deficits / Details: AAROM UE movement, decreased elbow extension ROM past neutral (noted old scar on L elbow), grip at least 4-/5   Lower Extremity Assessment Lower Extremity Assessment: Generalized weakness;RLE deficits/detail;LLE deficits/detail RLE Deficits / Details: AAROM for all movements, tactile/verbal consistent cues to maximize participation LLE Deficits / Details: AAROM for all movements, increased strength noted  in LLE compared to RLE       Communication Communication Communication: Other (comment)(small, short sentences, whispers, intermittently clear words)   Cognition Arousal/Alertness: Lethargic Behavior During Therapy: Flat affect Overall Cognitive Status: No family/caregiver present to determine baseline cognitive  functioning Area of Impairment: Orientation;Following commands;Safety/judgement                 Orientation Level: Disoriented to;Place;Time;Situation     Following Commands: Follows one step commands with increased time Safety/Judgement: Decreased awareness of safety;Decreased awareness of deficits     General Comments: oriented to self, able to answer ~75% simple questions appropriately with time to respond, attends to 1 step simple commands with increased time and cues   General Comments       Exercises Other Exercises Other Exercises: AAROM bil UE ex including shoulder flexion/ext, elbow flex/ext (chronic L ROM limitations), requires consistent verbal cues t/o session    Shoulder Instructions      Home Living Family/patient expects to be discharged to:: Skilled nursing facility([facility])                                        Prior Functioning/Environment Level of Independence: Needs assistance  Gait / Transfers Assistance Needed: Per chart/sister, pt was stood with harness last Monday with rehab. ADL's / Homemaking Assistance Needed: currently dependent            OT Problem List: Decreased strength;Decreased knowledge of use of DME or AE;Decreased range of motion;Decreased coordination;Impaired UE functional use;Pain;Impaired balance (sitting and/or standing)      OT Treatment/Interventions: Self-care/ADL training;Balance training;Therapeutic exercise;Therapeutic activities;DME and/or AE instruction;Patient/family education;Cognitive remediation/compensation    OT Goals(Current goals can be found in the care plan section) Acute Rehab OT Goals Patient Stated Goal: get better OT Goal Formulation: With patient Time For Goal Achievement: 10/09/18 Potential to Achieve Goals: Fair ADL Goals Pt Will Perform Grooming: with mod assist;bed level Pt/caregiver will Perform Home Exercise Program: Increased strength;Both right and left upper extremity;With  minimal assist;With written HEP provided Additional ADL Goal #1: Pt will perform bed mobility rolling side to side with MAX A x1 to support pressure relief to minimize risk of pressure wounds and to facilitate toileting, bathing, and dressing tasks at bed level.  OT Frequency: Min 2X/week   Barriers to D/C:            Co-evaluation              AM-PAC OT "6 Clicks" Daily Activity     Outcome Measure Help from another person eating meals?: Total(PEG tube) Help from another person taking care of personal grooming?: A Lot Help from another person toileting, which includes using toliet, bedpan, or urinal?: Total Help from another person bathing (including washing, rinsing, drying)?: A Lot Help from another person to put on and taking off regular upper body clothing?: A Lot Help from another person to put on and taking off regular lower body clothing?: A Lot 6 Click Score: 10   End of Session    Activity Tolerance: Patient tolerated treatment well Patient left: in bed;with call bell/phone within reach;with bed alarm set  OT Visit Diagnosis: Other abnormalities of gait and mobility (R26.89);Muscle weakness (generalized) (M62.81);Pain;Feeding difficulties (R63.3) Pain - Right/Left: (lower back)                Time: 4818-5631 OT Time Calculation (min): 13 min Charges:  OT  General Charges $OT Visit: 1 Visit OT Evaluation $OT Eval Moderate Complexity: 1 Mod  Jeni Salles, MPH, MS, OTR/L ascom [phone] 09/25/18, 3:05 PM

## 2018-09-25 NOTE — Progress Notes (Signed)
PHARMACIST - PHYSICIAN COMMUNICATION  CONCERNING: IV to Oral Route Change Policy  RECOMMENDATION: This patient is receiving famotidine by the intravenous route.  Based on criteria approved by the Pharmacy and Therapeutics Committee, the intravenous medication(s) is/are being converted to the equivalent oral dose form(s).   DESCRIPTION: These criteria include:  The patient is eating (either orally or via tube) and/or has been taking other orally administered medications for a least 24 hours  The patient has no evidence of active gastrointestinal bleeding or impaired GI absorption (gastrectomy, short bowel, patient on TNA or NPO).  If you have questions about this conversion, please contact the Pharmacy Department   []   [phone] )  [place], [facility] 09/25/2018 10:08 AM

## 2018-09-25 NOTE — Progress Notes (Signed)
Patient ID: Maurice Patterson, male   DOB: [birth year], 63 y.o.   MRN: [id number]  [employer] PROGRESS NOTE  Maurice Patterson YPP:[id number] DOB: 08-14-1956 DOA: 09/19/2018 PCP: [name], MD  HPI/Subjective: Patient answers some yes or no questions and able to follow some simple commands.  Objective: Vitals:   09/25/18 1000 09/25/18 1100  BP: (!) 118/96 (!) 142/80  Pulse: 93 89  Resp: (!) 23 18  Temp:    SpO2: 98% 96%    Filed Weights   09/23/18 0436 09/24/18 0458 09/25/18 0343  Weight: 61 kg 57.2 kg 56.3 kg    ROS: Review of Systems  Unable to perform ROS: Acuity of condition  Respiratory: Positive for cough. Negative for shortness of breath.   Cardiovascular: Negative for chest pain.  Gastrointestinal: Negative for abdominal pain, nausea and vomiting.   Exam: Physical Exam  HENT:  Nose: No mucosal edema.  Mouth/Throat: No oropharyngeal exudate.  Eyes: Pupils are equal, round, and reactive to light. Conjunctivae and lids are normal.  Neck: Carotid bruit is not present. No thyroid mass present.  Cardiovascular: Regular rhythm, S1 normal, S2 normal and normal heart sounds.  Respiratory: He has no decreased breath sounds. He has no wheezes. He has no rhonchi. He has no rales.  Some upper airway congestion.  GI: Soft. Bowel sounds are normal. There is no abdominal tenderness.  Musculoskeletal:     Right ankle: He exhibits no swelling.     Left ankle: He exhibits no swelling.  Neurological: He is alert.  Follows commands.  Able to lift his arms and legs up off the bed to command.  Skin: Skin is warm. Nails show no clubbing.  As per nurse, deep tissue injury purpleish hue on sacrum  Psychiatric: He has a normal mood and affect.  Following commands today.      Data Reviewed: Basic Metabolic Panel: Recent Labs  Lab 09/21/18 0507 09/22/18 0423 09/23/18 0439 09/23/18 1349 09/24/18 0427 09/24/18 0428 09/25/18 0435  NA 135 137 138  --   --  139 142  K  3.7 3.6 3.4*  --   --  3.5 3.7  CL 106 103 102  --   --  106 108  CO2 _0 --   --  27 26  GLUCOSE 190* 208* 101*  --   --  152* 187*  BUN _1 --   --  11 9  CREATININE 0.37* 0.42* 0.43*  --   --  0.38* 0.36*  CALCIUM 7.9* 8.0* 8.4*  --   --  8.6* 8.7*  MG 1.8 2.1  --   --   --  1.9 2.0  PHOS 2.7  --   --  2.6 3.2  --  3.0   Liver Function Tests: Recent Labs  Lab 09/19/18 2003  AST 23  ALT 43  ALKPHOS 180*  BILITOT 0.7  PROT 6.8  ALBUMIN 3.6   CBC: Recent Labs  Lab 09/19/18 2003 09/20/18 0449 09/21/18 0507 09/22/18 0423  WBC 12.7* 9.4 8.7 7.3  NEUTROABS 9.6*  --   --   --   HGB 14.2 12.5* 10.8* 10.0*  HCT 42.7 39.8 33.3* 31.4*  MCV 91.4 96.6 96.0 96.0  PLT 215 164 130* 139*   Cardiac Enzymes: Recent Labs  Lab 09/19/18 2003  TROPONINI <0.03   BNP (last 3 results) Recent Labs    07/22/18 2239 08/03/18 1120  BNP 40.0 95.0     CBG: Recent  Labs  Lab 09/24/18 1911 09/24/18 2328 09/25/18 0329 09/25/18 0728 09/25/18 1138  GLUCAP 189* 82 172* 134* 185*    Recent Results (from the past 240 hour(s))  Culture, respiratory (non-expectorated)     Status: None   Collection Time: 09/19/18  1:14 AM  Result Value Ref Range Status   Specimen Description TRACHEAL ASPIRATE  Final   Special Requests Normal  Final   Gram Stain   Final    FEW WBC PRESENT, PREDOMINANTLY PMN FEW GRAM POSITIVE COCCI IN PAIRS IN CLUSTERS RARE GRAM POSITIVE RODS RARE GRAM NEGATIVE RODS    Culture FEW STAPHYLOCOCCUS AUREUS  Final   Report Status 09/22/2018 FINAL  Final   Organism ID, Bacteria STAPHYLOCOCCUS AUREUS  Final      Susceptibility   Staphylococcus aureus - MIC*    CIPROFLOXACIN <=0.5 SENSITIVE Sensitive     ERYTHROMYCIN >=8 RESISTANT Resistant     GENTAMICIN <=0.5 SENSITIVE Sensitive     OXACILLIN 0.5 SENSITIVE Sensitive     TETRACYCLINE <=1 SENSITIVE Sensitive     VANCOMYCIN <=0.5 SENSITIVE Sensitive     TRIMETH/SULFA <=10 SENSITIVE Sensitive     CLINDAMYCIN  RESISTANT Resistant     RIFAMPIN <=0.5 SENSITIVE Sensitive     Inducible Clindamycin POSITIVE Resistant     * FEW STAPHYLOCOCCUS AUREUS  Blood Culture (routine x 2)     Status: None   Collection Time: 09/19/18  8:03 PM  Result Value Ref Range Status   Specimen Description BLOOD BLOOD RIGHT FOREARM  Final   Special Requests   Final    BOTTLES DRAWN AEROBIC AND ANAEROBIC Blood Culture results may not be optimal due to an excessive volume of blood received in culture bottles   Culture   Final    NO GROWTH 5 DAYS Performed at [facility], [address] [place], [place] [address]    Report Status 09/24/2018 FINAL  Final  Blood Culture (routine x 2)     Status: None   Collection Time: 09/19/18  8:03 PM  Result Value Ref Range Status   Specimen Description BLOOD LEFT ANTECUBITAL  Final   Special Requests   Final    BOTTLES DRAWN AEROBIC AND ANAEROBIC Blood Culture adequate volume   Culture   Final    NO GROWTH 5 DAYS Performed at [facility], [address], [place], [place] [address]    Report Status 09/24/2018 FINAL  Final  Urine culture     Status: None   Collection Time: 09/19/18  8:03 PM  Result Value Ref Range Status   Specimen Description   Final    URINE, RANDOM Performed at [facility], [address], [place], [place] [address]    Special Requests   Final    NONE Performed at [facility], [address], [place], [place] [address]    Culture   Final    NO GROWTH Performed at [facility], [place] [address], [place], [place] [address]    Report Status 09/21/2018 FINAL  Final  MRSA PCR Screening     Status: None   Collection Time: 09/19/18 10:52 PM  Result Value Ref Range Status   MRSA by PCR NEGATIVE NEGATIVE Final    Comment:        The GeneXpert MRSA Assay (FDA approved for NASAL specimens only), is one component of a comprehensive MRSA colonization surveillance program. It is not intended to diagnose  MRSA infection nor to guide or monitor treatment for MRSA  infections. Performed at [facility], [address] [place], [place] [address]       Scheduled Meds: . [START ON 09/26/2018] aspirin  81 mg Per Tube Daily  . chlorhexidine gluconate (MEDLINE KIT)  15 mL Mouth Rinse BID  . [START ON 09/26/2018] cholecalciferol  1,000 Units Per Tube Daily  . [START ON 09/26/2018] clopidogrel  75 mg Per Tube Daily  . donepezil  10 mg Per Tube QHS  . enoxaparin (LOVENOX) injection  40 mg Subcutaneous Q24H  . famotidine  20 mg Per Tube Daily  . free water  180 mL Per Tube Q4H  . insulin aspart  0-15 Units Subcutaneous Q4H  . insulin aspart  3 Units Subcutaneous Q4H  . insulin glargine  4 Units Subcutaneous QHS  . lidocaine  1 patch Transdermal Q24H  . [START ON 09/26/2018] memantine  10 mg Per Tube Daily   Continuous Infusions: . ampicillin-sulbactam (UNASYN) IV Stopped (09/25/18 0403)  . feeding supplement (GLUCERNA 1.5 CAL) 1,000 mL (09/25/18 0334)    Assessment/Plan:   1. Acute hypoxic respiratory failure secondary to aspiration pneumonia.  Patient extubated 3 days ago.  Currently breathing on room air. 2. Clinical sepsis due to aspiration pneumonia.  Patient on IV Unasyn.  Culture growing out sensitive staph aureus.  Can likely switch to Cipro upon dispel 3. Type 2 diabetes mellitus on low-dose glargine insulin and aspart insulin. 4. Severe malnutrition on PEG tube feedings.  Continuous tube feedings 55 cc/h. 5. History of dementia on Namenda and Aricept 6. Gastric ulceration on Pepcid 7. Deep tissue injury with purplish discoloration on buttock 8. Patient having swallow evaluation today  Code Status:     Code Status Orders  (From admission, onward)         Start     Ordered   09/19/18 2130  Full code  Continuous     09/19/18 2129        Code Status History    Date Active Date Inactive Code Status Order ID Comments User Context   07/23/2018 0122 09/08/2018 2029 Full  Code [id number]  Lance Coon, MD Inpatient   04/05/2016 0304 04/05/2016 1851 Full Code [id number]  Harvie Bridge, DO Inpatient   10/19/2015 0103 10/20/2015 1102 Full Code [id number]  Harrie Foreman, MD Inpatient   08/21/2015 1912 08/23/2015 1657 Full Code [id number]  Gladstone Lighter, MD Inpatient   02/19/2015 1627 02/20/2015 2054 Full Code [id number]  Henreitta Leber, MD Inpatient     Family Communication: Patient's sister at the bedside Disposition Plan: Once authorization obtained for rehab I will have a 3-day window in order to get the patient out.  Potential disposition tomorrow depending on clinical course.  Consultants:  Critical care specialist  Speech therapy  Antibiotics:  Unasyn  Time spent: 26 minutes  [place]

## 2018-09-25 NOTE — Clinical Social Work Note (Signed)
CSW contacted Colletta Maryland at [facility] and informed her that a new PT assessment was available. Shela Leff MSW,LCSW [phone]

## 2018-09-25 NOTE — Clinical Social Work Note (Signed)
CSW has spoken to physical therapy this morning and they will assess patient again today. Once the PT note has been documented CSW will contact [employer] to initiate another prior auth. Shela Leff MSW,LCSW [phone]

## 2018-09-25 NOTE — Progress Notes (Signed)
Pharmacy Consult for Unasyn Dosing.  Indication: Pneumonia.  Pharmacy Antibiotic Note  Maurice Patterson is a 63 y.o. male admitted on 09/19/2018 with sepsis secondary to aspiration pneumonia from [facility]. Pt was in ICU 2 weeks ago for aspiration pneumonia. Is currently admitted back into ICU for aspiration pneumonia. PMH consists of acute MI, CAD, CHF, CKD, dementia, DM, GERD, hyperlipidemia. Pt is currently intubated.   Pharmacy has been consulted for Unasyn dosing.  Plan: Continue Unasyn 3g IV q6h  Monitor CBC, renal function, and hepatic function.   Height: 5\' 6"  (167.6 cm) Weight: 130 lb 1.1 oz (59 kg) IBW/kg (Calculated) : 63.8  Temp (24hrs), Avg:100 F (37.8 C), Min:99.3 F (37.4 C), Max:101.1 F (38.4 C)  Recent Labs  Lab 09/19/18 2003 09/19/18 2306 09/20/18 0449 09/21/18 0507  WBC 12.7*  --  9.4 8.7  CREATININE 0.53*  --  0.50* 0.37*  LATICACIDVEN 2.6* 1.6  --   --     Estimated Creatinine Clearance: 80.9 mL/min (A) (by C-G formula based on SCr of 0.37 mg/dL (L)).    Allergies  Allergen Reactions  . Hydrocodone Anaphylaxis  . Morphine Other (See Comments)    Loss of memory  . Ambien [Zolpidem] Other (See Comments)    delirium    . Brilinta [Ticagrelor] Other (See Comments)    Stroke   . Flexeril [Cyclobenzaprine] Other (See Comments)    delerium   . Flunitrazepam Other (See Comments)    ROHYPNOL (hallucinations)  . Haldol [Haloperidol Lactate] Other (See Comments)    delerium   . Levetiracetam Diarrhea and Other (See Comments)    Unable to walk  . Lorazepam Hives  . Risperdal [Risperidone] Other (See Comments)    Delirium   . Trazodone Other (See Comments)    Delirium Can take in low doses   . Benadryl [Diphenhydramine Hcl (Sleep)] Rash  . Penicillins Rash    Mouth ulcers Has patient had a PCN reaction causing immediate rash, facial/tongue/throat swelling, SOB or lightheadedness with hypotension: Yes Has patient had a PCN reaction causing  severe rash involving mucus membranes or skin necrosis: No Has patient had a PCN reaction that required hospitalization No Has patient had a PCN reaction occurring within the last 10 years: Yes If all of the above answers are "NO", then may proceed with Cephalosporin use.  . Vancomycin Rash    Rash around IV site during vancomycin infusion    Antimicrobials this admission: 1/31 Unasyn >> 1/29 Clindamycin >> 1/30 1/29 Cefepime >> 1/29 1/29 Vancomycin x 1 dose  Microbiology results: 1/28 BCx: no growth 5 days 1/28 UCx: no growth. 1/28 Sputum: MSSA Resistant to erythromycin and  Clindamycin.  1/28 MRSA PCR: negative   Thank you for allowing pharmacy to be a part of this patient's care.  MLS 09/25/2018 2:27 PM

## 2018-09-25 NOTE — Progress Notes (Signed)
85 Down to xray for Barium swallow non monitored. Sister very annoying. Sister insist on helping care for patient and mostly in the way. Does not move or leave room when ask. Sister informed nurse that she quit her job to care for brother. Constanly complains about patients dry mouth. Explained x 3 that he has swallowing issues and cannot get po fluids. Sister,June, is very unrealistic about her expectations of patients recovery. Dr.Aleskerov explained prognosis in great detail.

## 2018-09-25 NOTE — Evaluation (Signed)
Objective Swallowing Evaluation: Type of Study: MBS-Modified Barium Swallow Study   Patient Details  Name: Maurice Patterson MRN: [id number] Date of Birth: 1956-05-13  Today's Date: 09/25/2018 Time: SLP Start Time (ACUTE ONLY): 1325 -SLP Stop Time (ACUTE ONLY): 1425  SLP Time Calculation (min) (ACUTE ONLY): 60 min   Past Medical History:  Past Medical History:  Diagnosis Date  . Acute MI ([place])   . Arthritis   . Back pain   . CAD (coronary artery disease)   . CHF (congestive heart failure) ([place])   . Chronic kidney disease   . Degenerative lumbar disc   . Dementia ([place])   . Diabetes mellitus without complication ([place])   . GERD (gastroesophageal reflux disease)   . Hernia of abdominal cavity   . Hyperlipemia   . Hypertension   . Malignant intraductal papillary mucinous tumor of pancreas ([place])   . Memory loss   . Pancreatitis   . Seizures ([place])    staring spells  . Shingles   . Stroke ([place]) 08/2017  . TIA (transient ischemic attack)    Past Surgical History:  Past Surgical History:  Procedure Laterality Date  . CARDIAC CATHETERIZATION    . CORONARY ANGIOPLASTY    . CYSTOSCOPY WITH STENT PLACEMENT Right 05/13/2017   Procedure: CYSTOSCOPY WITH STENT PLACEMENT;  Surgeon: Nickie Retort, MD;  Location: [facility] ORS;  Service: Urology;  Laterality: Right;  . ERCP N/A 09/12/2015   Procedure: ENDOSCOPIC RETROGRADE CHOLANGIOPANCREATOGRAPHY (ERCP);  Surgeon: Hulen Luster, MD;  Location: [facility]COPY;  Service: Gastroenterology;  Laterality: N/A;  . ERCP N/A 01/02/2016   Procedure: ENDOSCOPIC RETROGRADE CHOLANGIOPANCREATOGRAPHY (ERCP);  Surgeon: Hulen Luster, MD;  Location: [facility] ENDOSCOPY;  Service: Gastroenterology;  Laterality: N/A;  . EXTERNAL FIXATION WRIST FRACTURE Left   . left arm metal plate    . LEFT HEART CATH AND CORONARY ANGIOGRAPHY Left 11/04/2016   Procedure: Left Heart Cath and Coronary Angiography;  Surgeon: Isaias Cowman, MD;  Location: [facility];   Service: Cardiovascular;  Laterality: Left;  . Left shoulder surgery    . PEG PLACEMENT Left 08/28/2018   Procedure: PERCUTANEOUS ENDOSCOPIC GASTROSTOMY (PEG) PLACEMENT;  Surgeon: Lin Landsman, MD;  Location: [facility] ENDOSCOPY;  Service: Gastroenterology;  Laterality: Left;  . Stent times 3  2013   Cardiac  . TOTAL ELBOW REPLACEMENT Left   . VASECTOMY    . VENTRICULOPERITONEAL SHUNT     HPI: Pt is a 63 y.o male with past medical history of Alzheimer's Dementia, CAD, hypertension, seizure disorder on Lamictal 100 mg twice daily, chronic pancreatitis, herpes simplex encephalitis, diabetes, CVA, MI, hyperlipidemia, and Alzheimer's type dementia presenting to the ED. Pt was admitted w/ severe hypoxic and hypercapnic respiratory failure in setting of aspiration from [facility] after being found lying flat w/ copious amounts of green vomitus and phlegm in/around his mouth. Pt was orally intubated d/t respiratory status. Pt was poorly responsive at admission per notes. Pt was extubated after ~7 days. Pt remains Severe malnutrition in context of acute illness/injury. He continues to have difficulty managing/clearing his own secretions; he answers some yes/no questions and able to follow some simple commands. Noted congested coughing at baseline; oral phlegm which SLP cleared prior to study.   Subjective: pt sitting in MBSS chair; min mumbled speech but severely decreased speech intelligibility d/t open mouth posture and reduced effort. Unsure of pt's Cognitive-linguistic abilities - comprehension, but he did follow through w/ direct instruction to "swallow hard" during exam.  Assessment / Plan / Recommendation  CHL IP CLINICAL IMPRESSIONS 09/25/2018  Clinical Impression Pt appears to present w/ Moderate-Severe oropharyngeal phase dysphagia w/ increased risk for Aspiration, laryngeal penetration. This presentation increases risk for Pulmonary decline, including Aspiration pnemonia. In  addition, pt is at increased risk for Aspiration of Reflux from PEG TFs. During the oral phase, Significant oral phase deficits noted c/b reduced labial closure to pull bolus from spoon, reduced bolus management w/ decreased bolus cohesion for timely A-P transfer; reduced lingual strength/sensation for oral clearing, and premature spillage of bolus trials into the pharynx. Oral residue remained post the swallow. Pt required time, and verbal/tactile cues were given to attend, in order to orally transfer boluses for swallowing - lingual pumping noted. Pt was unable to use lingual sweeping for dry, f/u 2nd swallows to achieve full oral clearing. During the pharyngeal phase, pt exhibited delayed pharyngeal swallow initiation w/ trials of Nectar consistency liquids spilling into, and filling, the pyriform sinuses b/f the swallow initiated. W/ consistencies of Honey consistency liquids and Purees, the timing of the pharyngeal swallow was slightly improved w/ swallowing initiation noted at the level of the valleculae, and spilling slightly. During the pharyngeal swallowing, noted decreased BOT contact and reduced laryngeal excursion; reduced pharyngeal peristalsis suspected. This resulted in pharyngeal residue from boluses remaining in the valleculae, pyriform sinuses, BOT, and min along the pharyngeal wall. Also along the pharyngeal wall at [facility] was a small outpocketing of tissue, a diverticulum. This diverticulum did not appear to impede the bolus flow, and no residue remained around it post swallow. Noted min slower clearing of bolus material in the upper-mid Esophagus(shoulders obscured view); no regurgitation of po bolus material occurred during this study of few trials. During this study, NO obvious Aspiration occurred during the swallow, however, pt is at risk for Aspiration of the pharyngeal residue. Pt did exhibit a mild Cough intermittently (but as he swallowed) - unsure if related to irritation of the boluses in  the pharynx(?).  Pt would benefit from therapeutic po trials w/ 100% Supervision by ST services AND OMEs and pharyngeal swallowing exs as part of his Rehab POC in order to improve the oropharyngeal phases of swallowing in hopes to return to an oral diet in the future.   SLP Visit Diagnosis Dysphagia, oropharyngeal phase (R13.12)  Attention and concentration deficit following --  Frontal lobe and executive function deficit following --  Impact on safety and function Moderate aspiration risk;Severe aspiration risk;Risk for inadequate nutrition/hydration      CHL IP TREATMENT RECOMMENDATION 09/25/2018  Treatment Recommendations Defer treatment plan to f/u with SLP     Prognosis 09/25/2018  Prognosis for Safe Diet Advancement Guarded  Barriers to Reach Goals Cognitive deficits;Severity of deficits  Barriers/Prognosis Comment --    CHL IP DIET RECOMMENDATION 09/25/2018  SLP Diet Recommendations Alternative means - long-term  Liquid Administration via --  Medication Administration --  Compensations --  Postural Changes --      CHL IP OTHER RECOMMENDATIONS 09/25/2018  Recommended Consults (No Data)  Oral Care Recommendations Oral care QID;Staff/trained caregiver to provide oral care  Other Recommendations --      CHL IP FOLLOW UP RECOMMENDATIONS 09/25/2018  Follow up Recommendations Skilled Nursing facility      [facility] IP FREQUENCY AND DURATION 09/25/2018  Speech Therapy Frequency (ACUTE ONLY) (No Data)  Treatment Duration (No Data)           CHL IP ORAL PHASE 09/25/2018  Oral Phase Impaired  Oral - Pudding  Teaspoon --  Oral - Pudding Cup --  Oral - Honey Teaspoon --  Oral - Honey Cup --  Oral - Nectar Teaspoon --  Oral - Nectar Cup --  Oral - Nectar Straw --  Oral - Thin Teaspoon --  Oral - Thin Cup --  Oral - Thin Straw --  Oral - Puree --  Oral - Mech Soft --  Oral - Regular --  Oral - Multi-Consistency --  Oral - Pill --  Oral Phase - Comment Significant oral phase deficits  noted c/b reduced labial closure to pull bolus from spoon, reduced bolus management w/ decreased bolus cohesion for timely A-P transfer; reduced lingual strength/sensation for oral clearing, and premature spillage of bolus trials into the pharynx. Oral residue remained post the swallow. Pt required time, and verbal/tactile cues were given to attend, in order to orally transfer boluses for swallowing - lingual pumping noted. Pt was unable to use lingual sweeping for dry, f/u 2nd swallows to achieve full oral clearing.     CHL IP PHARYNGEAL PHASE 09/25/2018  Pharyngeal Phase Impaired  Pharyngeal- Pudding Teaspoon --  Pharyngeal --  Pharyngeal- Pudding Cup --  Pharyngeal --  Pharyngeal- Honey Teaspoon --  Pharyngeal --  Pharyngeal- Honey Cup --  Pharyngeal --  Pharyngeal- Nectar Teaspoon --  Pharyngeal --  Pharyngeal- Nectar Cup --  Pharyngeal --  Pharyngeal- Nectar Straw --  Pharyngeal --  Pharyngeal- Thin Teaspoon --  Pharyngeal --  Pharyngeal- Thin Cup --  Pharyngeal --  Pharyngeal- Thin Straw --  Pharyngeal --  Pharyngeal- Puree --  Pharyngeal --  Pharyngeal- Mechanical Soft --  Pharyngeal --  Pharyngeal- Regular --  Pharyngeal --  Pharyngeal- Multi-consistency --  Pharyngeal --  Pharyngeal- Pill --  Pharyngeal --  Pharyngeal Comment pt exhibited delayed pharyngeal swallow initiation w/ trials of Nectar consistency liquids spilling into, and filling, the pyriform sinuses b/f the swallow initiated. W/ consistencies of Honey consistency liquids and Purees, the timing of the pharyngeal swallow was slightly improved w/ swallowing initiation noted at the level of the valleculae, and spilling slightly. During the pharyngeal swallowing, noted decreased BOT contact and reduced laryngeal excursion; reduced pharyngeal peristalsis suspected. This resulted in pharyngeal residue from boluses remaining in the valleculae, pyriform sinuses, BOT, and min along the pharyngeal wall. Also along the  pharyngeal wall at [facility] was a small outpocketing of tissue, a diverticulum. This diverticulum did not appear to impede the bolus flow and no residue remained around it post swallow.      CHL IP CERVICAL ESOPHAGEAL PHASE 09/25/2018  Cervical Esophageal Phase Impaired  Pudding Teaspoon --  Pudding Cup --  Honey Teaspoon --  Honey Cup --  Nectar Teaspoon --  Nectar Cup --  Nectar Straw --  Thin Teaspoon --  Thin Cup --  Thin Straw --  Puree --  Mechanical Soft --  Regular --  Multi-consistency --  Pill --  Cervical Esophageal Comment min slower clearing of the upper-mid Esophagus post boluses given(below the level of the shoulders)       Orinda Kenner, MS, CCC-SLP , 09/25/2018, 3:54 PM

## 2018-09-25 NOTE — Progress Notes (Addendum)
1500 Back from Radiology. VSS.

## 2018-09-25 NOTE — Care Management (Addendum)
RNCM met with patient's sister, patient and spoke with wife [name] by phone regarding inpatient rehab and LTAC.  She has declined both since due to location.  She wants him to return to [facility] for rehab. Patient's wife [name] mentioned that she would like for patient to go back to [facility] on Wednesday.  

## 2018-09-25 NOTE — Evaluation (Signed)
Physical Therapy Re-Evaluation Patient Details Name: Maurice Patterson MRN: [id number] DOB: 06/13/1956 Today's Date: 09/25/2018   History of Present Illness  63 y.o. male. admitted with Acute Hypoxic Respiratory Failure in setting of Aspiration, Sepsis, and Altered Mental Status.  He required intubation in the ED, extubated 09/22/2018. Peg tube placed. Old small RIGHT cerebellar infarct and mild chronic small vessel ischemic changes found on workup. PMH consists of acute MI, CAD, CHF, CKD, dementia, DM, GERD, hyperlipidemia and recent hospital admissions.    Clinical Impression  Patient lethargic but arousable with family at bedside. Throughout session, pt with garbled, soft speech, but intermittently able to state clear words. Able to identify family member, state name, deny pain. Agreeable to participate with therapy, and overall showed good participation to mobilize/assist as able. UE and LE streng/ROM assessed, AAROM for all movements, greater participation with LUE and LLE than RUE and RLE. Patient needed constant verbal/tactile cueing to encourage participation. Patient able to attempt to initiate rolling to R side, maxAx2 to complete. Patient also attempted to utilize LUE to aid in sidelying to sit, maxAx2 to complete. Unable to maintain sitting EOB without maxAx2 but pt attempts to assist noted. Overall the patient demonstrated deficits (see "PT Problem List") that impede the patient's functional abilities, safety, and mobility and would benefit from skilled PT intervention to maximize independence and mobility.     Follow Up Recommendations SNF    Equipment Recommendations  None recommended by PT;Other (comment)(TBD )    Recommendations for Other Services OT consult;Speech consult     Precautions / Restrictions Precautions Precautions: Fall Restrictions Weight Bearing Restrictions: No      Mobility  Bed Mobility Overal bed mobility: Needs Assistance Bed Mobility: Supine to  Sit;Sit to Supine Rolling: +2 for physical assistance;Max assist;+2 for safety/equipment   Supine to sit: Max assist;+2 for physical assistance Sit to supine: Max assist;+2 for physical assistance   General bed mobility comments: attempted x2 this session. Patient able to attempt to initiate rolling to R, and attempted to push with LUE to sit, but maxAx2 to achieve. maxAx2 to maintain sitting EOB.   Transfers                 General transfer comment: deferred due to safety concerns  Ambulation/Gait             General Gait Details: not able at this time  Stairs            Wheelchair Mobility    Modified Rankin (Stroke Patients Only)       Balance Overall balance assessment: Needs assistance   Sitting balance-Leahy Scale: Zero Sitting balance - Comments: Pt needed max assist to maintain sitting, but did show attempts to assist                                     Pertinent Vitals/Pain Pain Assessment: No/denies pain    Home Living Family/patient expects to be discharged to:: Skilled nursing facility                 Additional Comments: sister  June at bedside, stated patient has been residing at [facility].    Prior Function Level of Independence: Needs assistance   Gait / Transfers Assistance Needed: Per sister, pt was stood with harness last Monday with rehab.  ADL's / Homemaking Assistance Needed: currently dependent        Hand Dominance  Extremity/Trunk Assessment   Upper Extremity Assessment Upper Extremity Assessment: LUE deficits/detail;RUE deficits/detail;Difficult to assess due to impaired cognition RUE Deficits / Details: AAROM for all UE movement LUE Deficits / Details: AAROM for all UE movement, but greater strength noted compared to RUE    Lower Extremity Assessment Lower Extremity Assessment: Generalized weakness;LLE deficits/detail;RLE deficits/detail RLE Deficits / Details: AAROM for all movements,  tactile/verbal consistent cues to maximize participation LLE Deficits / Details: AAROM for all movements, increased strength noted in LLE compared to RLE       Communication   Communication: Other (comment)(small, short sentences, whispers, intermittently clear words)  Cognition Arousal/Alertness: Lethargic Behavior During Therapy: Flat affect Overall Cognitive Status: Difficult to assess Area of Impairment: Orientation;Attention                 Orientation Level: Disoriented to     Following Commands: Follows one step commands with increased time       General Comments: Pt able to attend to one step commands with increased time, mod-max verbal and tactile cues. Tends to close eyes but able to open on command       General Comments      Exercises General Exercises - Lower Extremity Heel Slides: AAROM;10 reps;Both Straight Leg Raises: 10 reps;AROM;Left;AAROM;Right   Assessment/Plan    PT Assessment Patient needs continued PT services  PT Problem List Decreased strength;Decreased range of motion;Decreased activity tolerance;Decreased balance;Decreased coordination;Decreased mobility;Decreased knowledge of use of DME;Decreased safety awareness;Decreased cognition       PT Treatment Interventions DME instruction;Gait training;Stair training;Functional mobility training;Therapeutic activities;Balance training;Therapeutic exercise;Neuromuscular re-education;Cognitive remediation;Patient/family education    PT Goals (Current goals can be found in the Care Plan section)  Acute Rehab PT Goals Patient Stated Goal: per sister, wants patient to get stronger PT Goal Formulation: With family Time For Goal Achievement: 10/09/18 Potential to Achieve Goals: Poor    Frequency Min 2X/week   Barriers to discharge        Co-evaluation               AM-PAC PT "6 Clicks" Mobility  Outcome Measure Help needed turning from your back to your side while in a flat bed without  using bedrails?: Total Help needed moving from lying on your back to sitting on the side of a flat bed without using bedrails?: Total Help needed moving to and from a bed to a chair (including a wheelchair)?: Total Help needed standing up from a chair using your arms (e.g., wheelchair or bedside chair)?: Total Help needed to walk in hospital room?: Total Help needed climbing 3-5 steps with a railing? : Total 6 Click Score: 6    End of Session   Activity Tolerance: Patient limited by fatigue Patient left: in bed;with call bell/phone within reach;with family/visitor present Nurse Communication: Mobility status PT Visit Diagnosis: Other abnormalities of gait and mobility (R26.89);Muscle weakness (generalized) (M62.81)    Time: 0102-7253 PT Time Calculation (min) (ACUTE ONLY): 23 min   Charges:   PT Evaluation $PT Re-evaluation: 1 Re-eval PT Treatments $Therapeutic Activity: 8-22 mins       Lieutenant Diego PT, DPT 9:45 AM,09/25/18 [phone]

## 2018-09-25 NOTE — Clinical Social Work Note (Signed)
CSW contacted [employer] to see if a determination had been made as of yet regarding prior auth for SNF. Patient's case has gone to the [facility] Medical Director for review. Shela Leff MSW,LCSW [phone]

## 2018-09-26 ENCOUNTER — Inpatient Hospital Stay: Payer: PPO

## 2018-09-26 LAB — CBC WITH DIFFERENTIAL/PLATELET
Abs Immature Granulocytes: 0.07 10*3/uL (ref 0.00–0.07)
Basophils Absolute: 0.1 10*3/uL (ref 0.0–0.1)
Basophils Relative: 0 %
Eosinophils Absolute: 0.1 10*3/uL (ref 0.0–0.5)
Eosinophils Relative: 1 %
HCT: 38.7 % — ABNORMAL LOW (ref 39.0–52.0)
Hemoglobin: 12.4 g/dL — ABNORMAL LOW (ref 13.0–17.0)
Immature Granulocytes: 1 %
Lymphocytes Relative: 14 %
Lymphs Abs: 1.8 10*3/uL (ref 0.7–4.0)
MCH: 30 pg (ref 26.0–34.0)
MCHC: 32 g/dL (ref 30.0–36.0)
MCV: 93.7 fL (ref 80.0–100.0)
MONO ABS: 0.9 10*3/uL (ref 0.1–1.0)
Monocytes Relative: 7 %
NEUTROS ABS: 9.8 10*3/uL — AB (ref 1.7–7.7)
Neutrophils Relative %: 77 %
Platelets: 279 10*3/uL (ref 150–400)
RBC: 4.13 MIL/uL — AB (ref 4.22–5.81)
RDW: 16.4 % — ABNORMAL HIGH (ref 11.5–15.5)
WBC: 12.6 10*3/uL — ABNORMAL HIGH (ref 4.0–10.5)
nRBC: 0 % (ref 0.0–0.2)

## 2018-09-26 LAB — GLUCOSE, CAPILLARY
Glucose-Capillary: 177 mg/dL — ABNORMAL HIGH (ref 70–99)
Glucose-Capillary: 174 mg/dL — ABNORMAL HIGH (ref 70–99)
Glucose-Capillary: 183 mg/dL — ABNORMAL HIGH (ref 70–99)
Glucose-Capillary: 141 mg/dL — ABNORMAL HIGH (ref 70–99)
Glucose-Capillary: 179 mg/dL — ABNORMAL HIGH (ref 70–99)

## 2018-09-26 LAB — CREATININE, SERUM
CREATININE: 0.44 mg/dL — AB (ref 0.61–1.24)
GFR calc Af Amer: >60 mL/min (ref >60)
GFR calc non Af Amer: >60 mL/min (ref >60)

## 2018-09-26 LAB — PROCALCITONIN
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
## 2018-09-26 NOTE — Progress Notes (Signed)
Small amt of secretions obtained via NTS. Pt coughed several times (projectile) for a large amount of white/yellow frothy secretions. Pt tol procedure well. Pt remains on 3L O2. SPO2 93%, RR 21, HR 108. CPT via bed also started.

## 2018-09-26 NOTE — Progress Notes (Signed)
Pharmacy Electrolyte Monitoring Consult:  Pharmacy consulted to assist in monitoring and replacing electrolytes in this 63 y.o. male admitted on 09/19/2018 with Respiratory Distress   Labs:  Sodium (mmol/L)  Date Value  09/25/2018 142  10/20/2016 146 (H)  12/17/2014 138   Potassium (mmol/L)  Date Value  09/25/2018 3.7  12/17/2014 3.7   Magnesium (mg/dL)  Date Value  09/25/2018 2.0  12/17/2014 1.8   Phosphorus (mg/dL)  Date Value  09/25/2018 3.0   Calcium (mg/dL)  Date Value  09/25/2018 8.7 (L)   Calcium, Total (mg/dL)  Date Value  12/17/2014 7.9 (L)   Albumin (g/dL)  Date Value  09/19/2018 3.6  10/20/2016 4.5  12/17/2014 3.2 (L)    Assessment/Plan: Will order follow up BMP on 2/5.   Will replace for goal potassium ~ 4 and goal magnesium ~ 2.   Pharmacy will continue to monitor and adjust per consult.   MLS 09/26/2018 4:41 PM

## 2018-09-26 NOTE — Clinical Social Work Note (Signed)
CSW left message for patient's wife to touch base with her. [employer] advised that the new authorization would be for a continuation of where he left off at [facility]. He has used 10 days and so he would pick up at [facility] on day 11. Shela Leff MSW,LCSW [phone]

## 2018-09-26 NOTE — Progress Notes (Signed)
Pharmacy Consult for Unasyn Dosing.  Indication: Pneumonia.  Pharmacy Antibiotic Note  Maurice Patterson is a 63 y.o. male admitted on 09/19/2018 with sepsis secondary to aspiration pneumonia from [facility]. Pt was in ICU 2 weeks ago for aspiration pneumonia. Is currently admitted back into ICU for aspiration pneumonia. PMH consists of acute MI, CAD, CHF, CKD, dementia, DM, GERD, hyperlipidemia. Pt is currently intubated.   Pharmacy has been consulted for Unasyn dosing.  Plan: Continue Unasyn 3g IV q6h  Monitor CBC, renal function, and hepatic function.   Height: 5\' 6"  (167.6 cm) Weight: 130 lb 1.1 oz (59 kg) IBW/kg (Calculated) : 63.8  Temp (24hrs), Avg:100 F (37.8 C), Min:99.3 F (37.4 C), Max:101.1 F (38.4 C)  Recent Labs  Lab 09/19/18 2003 09/19/18 2306 09/20/18 0449 09/21/18 0507  WBC 12.7*  --  9.4 8.7  CREATININE 0.53*  --  0.50* 0.37*  LATICACIDVEN 2.6* 1.6  --   --     Estimated Creatinine Clearance: 80.9 mL/min (A) (by C-G formula based on SCr of 0.37 mg/dL (L)).    Allergies  Allergen Reactions  . Hydrocodone Anaphylaxis  . Morphine Other (See Comments)    Loss of memory  . Ambien [Zolpidem] Other (See Comments)    delirium    . Brilinta [Ticagrelor] Other (See Comments)    Stroke   . Flexeril [Cyclobenzaprine] Other (See Comments)    delerium   . Flunitrazepam Other (See Comments)    ROHYPNOL (hallucinations)  . Haldol [Haloperidol Lactate] Other (See Comments)    delerium   . Levetiracetam Diarrhea and Other (See Comments)    Unable to walk  . Lorazepam Hives  . Risperdal [Risperidone] Other (See Comments)    Delirium   . Trazodone Other (See Comments)    Delirium Can take in low doses   . Benadryl [Diphenhydramine Hcl (Sleep)] Rash  . Penicillins Rash    Mouth ulcers Has patient had a PCN reaction causing immediate rash, facial/tongue/throat swelling, SOB or lightheadedness with hypotension: Yes Has patient had a PCN reaction causing  severe rash involving mucus membranes or skin necrosis: No Has patient had a PCN reaction that required hospitalization No Has patient had a PCN reaction occurring within the last 10 years: Yes If all of the above answers are "NO", then may proceed with Cephalosporin use.  . Vancomycin Rash    Rash around IV site during vancomycin infusion    Antimicrobials this admission: 1/31 Unasyn >> 1/29 Clindamycin >> 1/30 1/29 Cefepime >> 1/29 1/29 Vancomycin x 1 dose  Microbiology results: 1/28 BCx: no growth. 1/28 UCx: no growth. 1/28 Sputum: MSSA Resistant to erythromycin and  Clindamycin.  1/28 MRSA PCR: negative   Thank you for allowing pharmacy to be a part of this patient's care.  MLS 09/26/2018 4:40 PM

## 2018-09-26 NOTE — Progress Notes (Signed)
Patient ID: ANGELICA WIX, male   DOB: September 29, 1955, 63 y.o.   MRN: [id number]  [facility] PROGRESS NOTE  DANTHONY KENDRIX RDE:[id number] DOB: [birth year] DOA: 09/19/2018 PCP: [name], MD  HPI/Subjective: Patient answers yes or no questions.  Follows commands.  Able to straight leg raise.  Cough is less than yesterday.  Objective: Vitals:   09/26/18 1200 09/26/18 1300  BP: (!) 146/91 (!) 137/98  Pulse: 88 97  Resp: (!) 21 (!) 23  Temp:    SpO2: 96% 94%    Filed Weights   09/24/18 0458 09/25/18 0343 09/26/18 0403  Weight: 57.2 kg 56.3 kg 55.7 kg    ROS: Review of Systems  Unable to perform ROS: Acuity of condition  Respiratory: Positive for cough. Negative for shortness of breath.   Cardiovascular: Negative for chest pain.  Gastrointestinal: Negative for abdominal pain, nausea and vomiting.   Exam: Physical Exam  HENT:  Nose: No mucosal edema.  Mouth/Throat: No oropharyngeal exudate.  Eyes: Pupils are equal, round, and reactive to light. Conjunctivae and lids are normal.  Neck: Carotid bruit is not present. No thyroid mass present.  Cardiovascular: Regular rhythm, S1 normal, S2 normal and normal heart sounds.  Respiratory: He has decreased breath sounds in the right lower field and the left lower field. He has no wheezes. He has no rhonchi. He has no rales.  GI: Soft. Bowel sounds are normal. There is no abdominal tenderness.  Musculoskeletal:     Right ankle: He exhibits no swelling.     Left ankle: He exhibits no swelling.  Neurological: He is alert.  Follows commands.  Able to lift his arms and legs up off the bed to command.  Skin: Skin is warm. Nails show no clubbing.  As per nurse, deep tissue injury purpleish hue on sacrum  Psychiatric: He has a normal mood and affect.  Following commands today.      Data Reviewed: Basic Metabolic Panel: Recent Labs  Lab 09/21/18 0507 09/22/18 0423 09/23/18 6314 09/23/18 1349 09/24/18 0427  09/24/18 0428 09/25/18 0435 09/26/18 0432  NA 135 137 138  --   --  139 142  --   K 3.7 3.6 3.4*  --   --  3.5 3.7  --   CL 106 103 102  --   --  106 108  --   CO2 _0 --   --  27 26  --   GLUCOSE 190* 208* 101*  --   --  152* 187*  --   BUN _1 --   --  11 9  --   CREATININE 0.37* 0.42* 0.43*  --   --  0.38* 0.36* 0.44*  CALCIUM 7.9* 8.0* 8.4*  --   --  8.6* 8.7*  --   MG 1.8 2.1  --   --   --  1.9 2.0  --   PHOS 2.7  --   --  2.6 3.2  --  3.0  --    Liver Function Tests: Recent Labs  Lab 09/19/18 2003  AST 23  ALT 43  ALKPHOS 180*  BILITOT 0.7  PROT 6.8  ALBUMIN 3.6   CBC: Recent Labs  Lab 09/19/18 2003 09/20/18 0449 09/21/18 0507 09/22/18 0423 09/26/18 0432  WBC 12.7* 9.4 8.7 7.3 12.6*  NEUTROABS 9.6*  --   --   --  9.8*  HGB 14.2 12.5* 10.8* 10.0* 12.4*  HCT 42.7 39.8 33.3* 31.4* 38.7*  MCV  91.4 96.6 96.0 96.0 93.7  PLT 215 164 130* 139* 279   Cardiac Enzymes: Recent Labs  Lab 09/19/18 2003  TROPONINI <0.03   BNP (last 3 results) Recent Labs    07/22/18 2239 08/03/18 1120  BNP 40.0 95.0     CBG: Recent Labs  Lab 09/25/18 1931 09/25/18 2331 09/26/18 0338 09/26/18 0715 09/26/18 1128  GLUCAP 172* 142* 177* 174* 183*    Recent Results (from the past 240 hour(s))  Culture, respiratory (non-expectorated)     Status: None   Collection Time: 09/19/18  1:14 AM  Result Value Ref Range Status   Specimen Description TRACHEAL ASPIRATE  Final   Special Requests Normal  Final   Gram Stain   Final    FEW WBC PRESENT, PREDOMINANTLY PMN FEW GRAM POSITIVE COCCI IN PAIRS IN CLUSTERS RARE GRAM POSITIVE RODS RARE GRAM NEGATIVE RODS    Culture FEW STAPHYLOCOCCUS AUREUS  Final   Report Status 09/22/2018 FINAL  Final   Organism ID, Bacteria STAPHYLOCOCCUS AUREUS  Final      Susceptibility   Staphylococcus aureus - MIC*    CIPROFLOXACIN <=0.5 SENSITIVE Sensitive     ERYTHROMYCIN >=8 RESISTANT Resistant     GENTAMICIN <=0.5 SENSITIVE Sensitive      OXACILLIN 0.5 SENSITIVE Sensitive     TETRACYCLINE <=1 SENSITIVE Sensitive     VANCOMYCIN <=0.5 SENSITIVE Sensitive     TRIMETH/SULFA <=10 SENSITIVE Sensitive     CLINDAMYCIN RESISTANT Resistant     RIFAMPIN <=0.5 SENSITIVE Sensitive     Inducible Clindamycin POSITIVE Resistant     * FEW STAPHYLOCOCCUS AUREUS  Blood Culture (routine x 2)     Status: None   Collection Time: 09/19/18  8:03 PM  Result Value Ref Range Status   Specimen Description BLOOD BLOOD RIGHT FOREARM  Final   Special Requests   Final    BOTTLES DRAWN AEROBIC AND ANAEROBIC Blood Culture results may not be optimal due to an excessive volume of blood received in culture bottles   Culture   Final    NO GROWTH 5 DAYS Performed at [facility], [address] [place], [place] [address]    Report Status 09/24/2018 FINAL  Final  Blood Culture (routine x 2)     Status: None   Collection Time: 09/19/18  8:03 PM  Result Value Ref Range Status   Specimen Description BLOOD LEFT ANTECUBITAL  Final   Special Requests   Final    BOTTLES DRAWN AEROBIC AND ANAEROBIC Blood Culture adequate volume   Culture   Final    NO GROWTH 5 DAYS Performed at [facility], [address] [place], [place] [address]    Report Status 09/24/2018 FINAL  Final  Urine culture     Status: None   Collection Time: 09/19/18  8:03 PM  Result Value Ref Range Status   Specimen Description   Final    URINE, RANDOM Performed at [facility], [address] [place], [place] [address]    Special Requests   Final    NONE Performed at [facility], [address], [place], [place] [address]    Culture   Final    NO GROWTH Performed at [facility], [place] [address], [place], [place] [address]    Report Status 09/21/2018 FINAL  Final  MRSA PCR Screening     Status: None   Collection Time: 09/19/18 10:52 PM  Result Value Ref Range Status   MRSA by PCR NEGATIVE  NEGATIVE Final    Comment:        The  GeneXpert MRSA Assay (FDA approved for NASAL specimens only), is one component of a comprehensive MRSA colonization surveillance program. It is not intended to diagnose MRSA infection nor to guide or monitor treatment for MRSA infections. Performed at [facility], [address] [place], [place] [address]   CULTURE, BLOOD (ROUTINE X 2) w Reflex to ID Panel     Status: None (Preliminary result)   Collection Time: 09/26/18  8:25 AM  Result Value Ref Range Status   Specimen Description BLOOD BLOOD RIGHT HAND  Final   Special Requests   Final    BOTTLES DRAWN AEROBIC AND ANAEROBIC Blood Culture adequate volume   Culture   Final    NO GROWTH < 12 HOURS Performed at [facility], [address], [place], [place] [address]    Report Status PENDING  Incomplete  CULTURE, BLOOD (ROUTINE X 2) w Reflex to ID Panel     Status: None (Preliminary result)   Collection Time: 09/26/18  8:25 AM  Result Value Ref Range Status   Specimen Description BLOOD BLOOD LEFT HAND  Final   Special Requests   Final    BOTTLES DRAWN AEROBIC AND ANAEROBIC Blood Culture adequate volume   Culture   Final    NO GROWTH < 12 HOURS Performed at [facility], [address] [place], [place] [address]    Report Status PENDING  Incomplete      Scheduled Meds: . aspirin  81 mg Per Tube Daily  . chlorhexidine gluconate (MEDLINE KIT)  15 mL Mouth Rinse BID  . cholecalciferol  1,000 Units Per Tube Daily  . clopidogrel  75 mg Per Tube Daily  . donepezil  10 mg Per Tube QHS  . enoxaparin (LOVENOX) injection  40 mg Subcutaneous Q24H  . famotidine  20 mg Per Tube Daily  . free water  180 mL Per Tube Q4H  . insulin aspart  0-15 Units Subcutaneous Q4H  . insulin aspart  3 Units Subcutaneous Q4H  . insulin glargine  4 Units Subcutaneous QHS  . lidocaine  1 patch Transdermal Q24H  . memantine  10 mg Per Tube Daily   Continuous Infusions: . ampicillin-sulbactam (UNASYN) IV 3 g (09/26/18  1202)  . feeding supplement (GLUCERNA 1.5 CAL) 1,000 mL (09/25/18 0334)    Assessment/Plan:   1. Acute hypoxic respiratory failure secondary to aspiration pneumonia.  Currently breathing on room air. 2. Clinical sepsis due to aspiration pneumonia.  Patient on IV Unasyn.  Culture growing out sensitive staph aureus.  Can likely switch to Levaquin upon disposition.  Critical care specialist ordered repeat blood cultures secondary to white blood cell count going up.  Patient is a high risk for continued aspiration with tube feeds.  Plan on disposition to rehab tomorrow. 3. Type 2 diabetes mellitus on low-dose glargine insulin and aspart insulin. 4. Severe malnutrition on PEG tube feedings.  Continuous tube feedings 55 cc/h. 5. History of dementia on Namenda and Aricept 6. Gastric ulceration on Pepcid 7. Deep tissue injury with purplish discoloration on buttock 8. Patient failed swallow evaluation yesterday.  Code Status:     Code Status Orders  (From admission, onward)         Start     Ordered   09/19/18 2130  Full code  Continuous     09/19/18 2129        Code Status History    Date  Active Date Inactive Code Status Order ID Comments User Context   07/23/2018 0122 09/08/2018 2029 Full Code [id number]  Lance Coon, MD Inpatient   04/05/2016 0304 04/05/2016 1851 Full Code [id number]  Harvie Bridge, DO Inpatient   10/19/2015 0103 10/20/2015 1102 Full Code [id number]  Harrie Foreman, MD Inpatient   08/21/2015 1912 08/23/2015 1657 Full Code [id number]  Gladstone Lighter, MD Inpatient   02/19/2015 1627 02/20/2015 2054 Full Code [id number]  Henreitta Leber, MD Inpatient     Family Communication: Patient's wife on the phone Disposition Plan: Authorization for rehab obtained yesterday afternoon.  We only have a 3-day window in order to get the patient out to rehab.  Consultants:  Critical care specialist  Speech therapy  Antibiotics:  Unasyn  Time spent: 27 minutes.  Spoke  with wife on the phone and critical care specialist and nursing staff.  Colette Dicamillo [employer]

## 2018-09-26 NOTE — Clinical Social Work Note (Signed)
Patient wife called CSW and stated that she had spoken with the ICU physician and that she was not approving for LTAC or CIR to be looked into as an option for discharge. She wants her husband to return to [facility]. Shela Leff MSW,LCSW [phone]

## 2018-09-27 ENCOUNTER — Inpatient Hospital Stay: Payer: PPO

## 2018-09-27 LAB — BASIC METABOLIC PANEL
Anion gap: 6 (ref 5–15)
Anion gap: 7 (ref 5–15)
BUN: 13 mg/dL (ref 8–23)
BUN: 14 mg/dL (ref 8–23)
CO2: 27 mmol/L (ref 22–32)
CO2: 27 mmol/L (ref 22–32)
Calcium: 8.6 mg/dL — ABNORMAL LOW (ref 8.9–10.3)
Calcium: 8.5 mg/dL — ABNORMAL LOW (ref 8.9–10.3)
Chloride: 106 mmol/L (ref 98–111)
Chloride: 106 mmol/L (ref 98–111)
Creatinine, Ser: 0.57 mg/dL — ABNORMAL LOW (ref 0.61–1.24)
Creatinine, Ser: 0.41 mg/dL — ABNORMAL LOW (ref 0.61–1.24)
GFR calc Af Amer: >60 mL/min (ref >60)
GFR calc non Af Amer: >60 mL/min (ref >60)
Glucose, Bld: 142 mg/dL — ABNORMAL HIGH (ref 70–99)
Glucose, Bld: 132 mg/dL — ABNORMAL HIGH (ref 70–99)
POTASSIUM: 3.5 mmol/L (ref 3.5–5.1)
Potassium: 3.4 mmol/L — ABNORMAL LOW (ref 3.5–5.1)
Sodium: 139 mmol/L (ref 135–145)
Sodium: 140 mmol/L (ref 135–145)

## 2018-09-27 LAB — PROCALCITONIN: Procalcitonin: <0.1 ng/mL

## 2018-09-27 LAB — CBC WITH DIFFERENTIAL/PLATELET
Abs Immature Granulocytes: 0.04 10*3/uL (ref 0.00–0.07)
Basophils Absolute: 0 10*3/uL (ref 0.0–0.1)
Basophils Relative: 0 %
Eosinophils Absolute: 0 10*3/uL (ref 0.0–0.5)
Eosinophils Relative: 0 %
HEMATOCRIT: 38 % — AB (ref 39.0–52.0)
Hemoglobin: 12.4 g/dL — ABNORMAL LOW (ref 13.0–17.0)
Immature Granulocytes: 1 %
LYMPHS ABS: 1.6 10*3/uL (ref 0.7–4.0)
Lymphocytes Relative: 19 %
MCH: 30.3 pg (ref 26.0–34.0)
MCHC: 32.6 g/dL (ref 30.0–36.0)
MCV: 92.9 fL (ref 80.0–100.0)
Monocytes Absolute: 0.8 10*3/uL (ref 0.1–1.0)
Monocytes Relative: 10 %
Neutro Abs: 6.1 10*3/uL (ref 1.7–7.7)
Neutrophils Relative %: 70 %
Platelets: 318 10*3/uL (ref 150–400)
RBC: 4.09 MIL/uL — ABNORMAL LOW (ref 4.22–5.81)
RDW: 16.5 % — ABNORMAL HIGH (ref 11.5–15.5)
WBC: 8.7 10*3/uL (ref 4.0–10.5)
nRBC: 0 % (ref 0.0–0.2)

## 2018-09-27 LAB — GLUCOSE, CAPILLARY
Glucose-Capillary: 101 mg/dL — ABNORMAL HIGH (ref 70–99)
Glucose-Capillary: 124 mg/dL — ABNORMAL HIGH (ref 70–99)
Glucose-Capillary: 131 mg/dL — ABNORMAL HIGH (ref 70–99)
Glucose-Capillary: 134 mg/dL — ABNORMAL HIGH (ref 70–99)
Glucose-Capillary: 139 mg/dL — ABNORMAL HIGH (ref 70–99)
Glucose-Capillary: 148 mg/dL — ABNORMAL HIGH (ref 70–99)
Glucose-Capillary: 200 mg/dL — ABNORMAL HIGH (ref 70–99)

## 2018-09-27 LAB — PHOSPHORUS: Phosphorus: 3.1 mg/dL (ref 2.5–4.6)

## 2018-09-27 LAB — CORTISOL: Cortisol, Plasma: 19.6 ug/dL

## 2018-09-27 MED ORDER — POTASSIUM CHLORIDE 20 MEQ PO PACK
20.0000 meq | PACK | Freq: Once | ORAL | Status: AC
  Administered 2018-09-27: 20 meq
  Filled 2018-09-27: qty 1

## 2018-09-27 MED ORDER — POTASSIUM CHLORIDE 20 MEQ PO PACK
30.0000 meq | PACK | Freq: Once | ORAL | Status: AC
  Administered 2018-09-27: 30 meq via ORAL
  Filled 2018-09-27: qty 2

## 2018-09-27 MED ORDER — METOCLOPRAMIDE HCL 5 MG/ML IJ SOLN
10.0000 mg | Freq: Once | INTRAMUSCULAR | Status: AC
  Administered 2018-09-27: 10 mg via INTRAVENOUS
  Filled 2018-09-27: qty 2

## 2018-09-27 MED ORDER — GLUCERNA 1.5 CAL PO LIQD
1000.0000 mL | ORAL | Status: DC
  Administered 2018-09-27: 1000 mL

## 2018-09-27 MED ORDER — METOCLOPRAMIDE HCL 5 MG/5ML PO SOLN
10.0000 mg | Freq: Once | ORAL | Status: DC
  Filled 2018-09-27: qty 10

## 2018-09-27 MED ORDER — FREE WATER
30.0000 mL | Status: DC
  Administered 2018-09-27 – 2018-09-29 (×10): 30 mL

## 2018-09-27 NOTE — Accreditation Note (Signed)
Patient not stable for transfer today as he is actively vomiting this morning. CSW will update [facility]. Shela Leff MSW,LCSW [phone]

## 2018-09-27 NOTE — Progress Notes (Addendum)
SLP Cancellation Note  Patient Details Name: Maurice Patterson MRN: [id number] DOB: April 06, 1956   Cancelled treatment:       Reason Eval/Treat Not Completed: Patient not medically ready(chart reviewed. NSG consulted re: pt's status). Per MD and chart notes, pt actively vomiting and aspirating this morning with gargling respirations after at rest. Pt is on full TFs via PEG; no oral intake.  MBSS was completed on 09/25/2018 which revealed oropharyngeal phase dysphagia w/ recommendation to be followed by ST services at [place] (after medical status has improved and acuity of illness has lessened) to work on therapeutic dysphagia tx, swallowing exs in hopes to improve the oropharyngeal swallow function for oral intake in the future. Recommend frequent oral care for hygiene and oral stimulation for swallowing.   Orinda Kenner, MS, CCC-SLP Watson,Katherine 09/27/2018, 11:13 AM

## 2018-09-27 NOTE — Progress Notes (Signed)
Pharmacy Consult for Unasyn Dosing.  Indication: Pneumonia.  Pharmacy Antibiotic Note-Day 6 of Unasyn  Maurice Patterson is a 63 y.o. male admitted on 09/19/2018 with sepsis secondary to aspiration pneumonia from [facility]. Pt was in ICU 2 weeks ago for aspiration pneumonia. Is currently admitted back into ICU for aspiration pneumonia. PMH consists of acute MI, CAD, CHF, CKD, dementia, DM, GERD, hyperlipidemia. Pt is currently intubated.   Pharmacy has been consulted for Unasyn dosing.  Plan: Continue Unasyn 3g IV q6h  Height: 5\' 6"  (167.6 cm) Weight: 130 lb 1.1 oz (59 kg) IBW/kg (Calculated) : 63.8  Temp (24hrs), Avg:100 F (37.8 C), Min:99.3 F (37.4 C), Max:101.1 F (38.4 C)  Recent Labs  Lab 09/19/18 2003 09/19/18 2306 09/20/18 0449 09/21/18 0507  WBC 12.7*  --  9.4 8.7  CREATININE 0.53*  --  0.50* 0.37*  LATICACIDVEN 2.6* 1.6  --   --     Estimated Creatinine Clearance: 80.9 mL/min (A) (by C-G formula based on SCr of 0.37 mg/dL (L)).    Allergies  Allergen Reactions  . Hydrocodone Anaphylaxis  . Morphine Other (See Comments)    Loss of memory  . Ambien [Zolpidem] Other (See Comments)    delirium    . Brilinta [Ticagrelor] Other (See Comments)    Stroke   . Flexeril [Cyclobenzaprine] Other (See Comments)    delerium   . Flunitrazepam Other (See Comments)    ROHYPNOL (hallucinations)  . Haldol [Haloperidol Lactate] Other (See Comments)    delerium   . Levetiracetam Diarrhea and Other (See Comments)    Unable to walk  . Lorazepam Hives  . Risperdal [Risperidone] Other (See Comments)    Delirium   . Trazodone Other (See Comments)    Delirium Can take in low doses   . Benadryl [Diphenhydramine Hcl (Sleep)] Rash  . Penicillins Rash    Mouth ulcers Has patient had a PCN reaction causing immediate rash, facial/tongue/throat swelling, SOB or lightheadedness with hypotension: Yes Has patient had a PCN reaction causing severe rash involving mucus membranes  or skin necrosis: No Has patient had a PCN reaction that required hospitalization No Has patient had a PCN reaction occurring within the last 10 years: Yes If all of the above answers are "NO", then may proceed with Cephalosporin use.  . Vancomycin Rash    Rash around IV site during vancomycin infusion    Antimicrobials this admission: 1/31 Unasyn >> 1/29 Clindamycin >> 1/30 1/29 Cefepime >> 1/29 1/29 Vancomycin x 1 dose  Microbiology results: 1/28 BCx: no growth. 1/28 UCx: no growth. 1/28 Sputum: MSSA Resistant to erythromycin and  Clindamycin.  1/28 MRSA PCR: negative   Thank you for allowing pharmacy to be a part of this patient's care.  MLS 09/27/2018 9:06 AM

## 2018-09-27 NOTE — Progress Notes (Signed)
Approximately at 2230 this RN noticed patient was de-sating to 89% on room air. Patient was very rhonchus, had congested cough and could not expectorate secretions. Due to history of recurrent aspirations TF were put on hold and NP Dewaine Conger was notified. New orders received for CXR, NTS, CPT, and to hold TF for the rest of the night. Patient also put on 3L Curry. Will continue to monitor and assess.

## 2018-09-27 NOTE — Progress Notes (Signed)
Patient ID: Maurice Patterson, male   DOB: 1956-03-13, 63 y.o.   MRN: [id number]  [facility] PROGRESS NOTE  Maurice Patterson:[id number] DOB: 01/31/1956 DOA: 09/19/2018 PCP: [name], MD  HPI/Subjective: Patient seen this morning early and he did not look as good as he normally does.  He was less responsive but opened his eyes.  He was coughing quite a bit.  This afternoon I reevaluated him and he was a little more alert.  Was able to follow few commands.  Patient had vomiting episode last night and tube feeds were held overnight.  Objective: Vitals:   09/27/18 1500 09/27/18 1600  BP: (!) 163/90 132/83  Pulse:    Resp: (!) 24 17  Temp:  98.2 F (36.8 C)  SpO2:  99%    Filed Weights   09/25/18 0343 09/26/18 0403 09/27/18 0402  Weight: 56.3 kg 55.7 kg 55.4 kg    ROS: Review of Systems  Unable to perform ROS: Acuity of condition   Exam: Physical Exam  Constitutional: He appears lethargic.  HENT:  Nose: No mucosal edema.  Mouth/Throat: No oropharyngeal exudate.  Eyes: Pupils are equal, round, and reactive to light. Conjunctivae and lids are normal.  Neck: Carotid bruit is not present. No thyroid mass present.  Cardiovascular: Regular rhythm, S1 normal, S2 normal and normal heart sounds.  Respiratory: He has decreased breath sounds in the right lower field and the left lower field. He has no wheezes. He has no rhonchi. He has no rales.  GI: Soft. Bowel sounds are normal. There is no abdominal tenderness.  Musculoskeletal:     Right ankle: He exhibits no swelling.     Left ankle: He exhibits no swelling.  Neurological: He appears lethargic.  Skin: Skin is warm. Nails show no clubbing.  As per nurse, deep tissue injury purpleish hue on sacrum  Psychiatric:  This morning was less responsive.  This afternoon was following a few commands.      Data Reviewed: Basic Metabolic Panel: Recent Labs  Lab 09/21/18 0507 09/22/18 0423 09/23/18 0223 09/23/18 1349  09/24/18 0427 09/24/18 0428 09/25/18 0435 09/26/18 0432 09/27/18 0516 09/27/18 1020  NA 135 137 138  --   --  139 142  --  139 140  K 3.7 3.6 3.4*  --   --  3.5 3.7  --  3.4* 3.5  CL 106 103 102  --   --  106 108  --  106 106  CO2 _0 --   --  27 26  --  27 27  GLUCOSE 190* 208* 101*  --   --  152* 187*  --  142* 132*  BUN _1 --   --  11 9  --  13 14  CREATININE 0.37* 0.42* 0.43*  --   --  0.38* 0.36* 0.44* 0.57* 0.41*  CALCIUM 7.9* 8.0* 8.4*  --   --  8.6* 8.7*  --  8.6* 8.5*  MG 1.8 2.1  --   --   --  1.9 2.0  --   --   --   PHOS 2.7  --   --  2.6 3.2  --  3.0  --   --  3.1   CBC: Recent Labs  Lab 09/21/18 0507 09/22/18 0423 09/26/18 0432 09/27/18 1020  WBC 8.7 7.3 12.6* 8.7  NEUTROABS  --   --  9.8* 6.1  HGB 10.8* 10.0* 12.4* 12.4*  HCT 33.3* 31.4* 38.7*  38.0*  MCV 96.0 96.0 93.7 92.9  PLT 130* 139* 279 318   BNP (last 3 results) Recent Labs    07/22/18 2239 08/03/18 1120  BNP 40.0 95.0     CBG: Recent Labs  Lab 09/27/18 0020 09/27/18 0346 09/27/18 0724 09/27/18 1145 09/27/18 1611  GLUCAP 200* 148* 131* 134* 101*    Recent Results (from the past 240 hour(s))  Culture, respiratory (non-expectorated)     Status: None   Collection Time: 09/19/18  1:14 AM  Result Value Ref Range Status   Specimen Description TRACHEAL ASPIRATE  Final   Special Requests Normal  Final   Gram Stain   Final    FEW WBC PRESENT, PREDOMINANTLY PMN FEW GRAM POSITIVE COCCI IN PAIRS IN CLUSTERS RARE GRAM POSITIVE RODS RARE GRAM NEGATIVE RODS    Culture FEW STAPHYLOCOCCUS AUREUS  Final   Report Status 09/22/2018 FINAL  Final   Organism ID, Bacteria STAPHYLOCOCCUS AUREUS  Final      Susceptibility   Staphylococcus aureus - MIC*    CIPROFLOXACIN <=0.5 SENSITIVE Sensitive     ERYTHROMYCIN >=8 RESISTANT Resistant     GENTAMICIN <=0.5 SENSITIVE Sensitive     OXACILLIN 0.5 SENSITIVE Sensitive     TETRACYCLINE <=1 SENSITIVE Sensitive     VANCOMYCIN <=0.5 SENSITIVE  Sensitive     TRIMETH/SULFA <=10 SENSITIVE Sensitive     CLINDAMYCIN RESISTANT Resistant     RIFAMPIN <=0.5 SENSITIVE Sensitive     Inducible Clindamycin POSITIVE Resistant     * FEW STAPHYLOCOCCUS AUREUS  Blood Culture (routine x 2)     Status: None   Collection Time: 09/19/18  8:03 PM  Result Value Ref Range Status   Specimen Description BLOOD BLOOD RIGHT FOREARM  Final   Special Requests   Final    BOTTLES DRAWN AEROBIC AND ANAEROBIC Blood Culture results may not be optimal due to an excessive volume of blood received in culture bottles   Culture   Final    NO GROWTH 5 DAYS Performed at [facility], [address] [place], [place] [address]    Report Status 09/24/2018 FINAL  Final  Blood Culture (routine x 2)     Status: None   Collection Time: 09/19/18  8:03 PM  Result Value Ref Range Status   Specimen Description BLOOD LEFT ANTECUBITAL  Final   Special Requests   Final    BOTTLES DRAWN AEROBIC AND ANAEROBIC Blood Culture adequate volume   Culture   Final    NO GROWTH 5 DAYS Performed at [facility], [address], [place], [place] [address]    Report Status 09/24/2018 FINAL  Final  Urine culture     Status: None   Collection Time: 09/19/18  8:03 PM  Result Value Ref Range Status   Specimen Description   Final    URINE, RANDOM Performed at [facility], [address], [place], [place] [address]    Special Requests   Final    NONE Performed at [facility], [address] [place], [place] [address]    Culture   Final    NO GROWTH Performed at [facility], [place] [address], [place], [place] [address]    Report Status 09/21/2018 FINAL  Final  MRSA PCR Screening     Status: None   Collection Time: 09/19/18 10:52 PM  Result Value Ref Range Status   MRSA by PCR NEGATIVE NEGATIVE Final    Comment:  The GeneXpert MRSA Assay (FDA approved for NASAL specimens only), is one component of a comprehensive MRSA  colonization surveillance program. It is not intended to diagnose MRSA infection nor to guide or monitor treatment for MRSA infections. Performed at [facility], [address] [place], [place] [address]   CULTURE, BLOOD (ROUTINE X 2) w Reflex to ID Panel     Status: None (Preliminary result)   Collection Time: 09/26/18  8:25 AM  Result Value Ref Range Status   Specimen Description BLOOD BLOOD RIGHT HAND  Final   Special Requests   Final    BOTTLES DRAWN AEROBIC AND ANAEROBIC Blood Culture adequate volume   Culture   Final    NO GROWTH < 24 HOURS Performed at [facility], [address], [place], [place] [address]    Report Status PENDING  Incomplete  CULTURE, BLOOD (ROUTINE X 2) w Reflex to ID Panel     Status: None (Preliminary result)   Collection Time: 09/26/18  8:25 AM  Result Value Ref Range Status   Specimen Description BLOOD BLOOD LEFT HAND  Final   Special Requests   Final    BOTTLES DRAWN AEROBIC AND ANAEROBIC Blood Culture adequate volume   Culture   Final    NO GROWTH < 24 HOURS Performed at [facility], [address] [place], [place] [address]    Report Status PENDING  Incomplete      Scheduled Meds: . aspirin  81 mg Per Tube Daily  . chlorhexidine gluconate (MEDLINE KIT)  15 mL Mouth Rinse BID  . cholecalciferol  1,000 Units Per Tube Daily  . clopidogrel  75 mg Per Tube Daily  . donepezil  10 mg Per Tube QHS  . enoxaparin (LOVENOX) injection  40 mg Subcutaneous Q24H  . famotidine  20 mg Per Tube Daily  . free water  180 mL Per Tube Q4H  . insulin aspart  0-15 Units Subcutaneous Q4H  . insulin aspart  3 Units Subcutaneous Q4H  . insulin glargine  4 Units Subcutaneous QHS  . lidocaine  1 patch Transdermal Q24H  . memantine  10 mg Per Tube Daily   Continuous Infusions: . ampicillin-sulbactam (UNASYN) IV 3 g (09/27/18 1758)  . feeding supplement (GLUCERNA 1.5 CAL) Stopped (09/26/18 2315)     Assessment/Plan:   1. Clinical sepsis due to aspiration pneumonia.  Likely had another aspiration event overnight.  Patient on IV Unasyn.  Culture growing out sensitive staph aureus.  Patient is a high risk for continue aspiration.  Try to restart tube feeds. 2. Acute hypoxic respiratory failure.  Patient extubated and now breathing comfortably on room air. 3. Type 2 diabetes mellitus on low-dose glargine insulin and aspart insulin. 4. Severe malnutrition on PEG tube feedings.  Restart tube feedings today and titrate up to goal. 5. History of dementia on Namenda and Aricept 6. Gastric ulceration on Pepcid 7. Deep tissue injury with purplish discoloration on buttock 8. Patient failed swallow evaluation yesterday.  Code Status:     Code Status Orders  (From admission, onward)         Start     Ordered   09/19/18 2130  Full code  Continuous     09/19/18 2129        Code Status History    Date Active Date Inactive Code Status Order ID Comments User Context   07/23/2018 0122 09/08/2018 2029 Full Code [id number]  Lance Coon, MD Inpatient   04/05/2016 0304 04/05/2016 1851 Full  Code [id number]  Harvie Bridge, DO Inpatient   10/19/2015 0103 10/20/2015 1102 Full Code [id number]  Harrie Foreman, MD Inpatient   08/21/2015 1912 08/23/2015 1657 Full Code [id number]  Gladstone Lighter, MD Inpatient   02/19/2015 1627 02/20/2015 2054 Full Code [id number]  Henreitta Leber, MD Inpatient     Family Communication: Patient's wife on the phone twice today this afternoon.  Spoke with sister at the bedside this morning Disposition Plan: Authorization for rehab obtained but only have a short window to get this patient out of the hospital.  Consultants:  Critical care specialist  Speech therapy  Antibiotics:  Unasyn  Time spent: 35 minutes.  Spoke with wife on the phone twice today and critical care specialist and nursing staff.  Spoke with sister at the bedside earlier this  morning.  Levin Dagostino [employer]

## 2018-09-27 NOTE — Progress Notes (Signed)
OT Cancellation Note  Patient Details Name: Maurice Patterson MRN: [id number] DOB: November 08, 1955   Cancelled Treatment:    Reason Eval/Treat Not Completed: Medical issues which prohibited therapy;Other (comment) Chart reviewed and per physician and nursing notes as well as speaking with RN on this shift, pt was unable to leave the unit today d/t several witnessed episodes of vomiting/aspiration as well as gurgling. CXR ordered. Nurse states pt will likely not be able to participate in therapy. Upon observation of pt, he is unable to be awoken and snoring loudly despite attempts to encourage pt to open eyes/attend. OT is held this date d/t pt not appropriate at this time. Will f/u as/if pt becomes more appropriate.   Jake Church Briggette Najarian 09/27/2018, 3:40 PM

## 2018-09-27 NOTE — Progress Notes (Addendum)
Pharmacy Electrolyte Monitoring Consult:  Pharmacy consulted to assist in monitoring and replacing electrolytes in this 63 y.o. male admitted on 09/19/2018 with Respiratory Distress   Labs:  Sodium (mmol/L)  Date Value  09/27/2018 139  10/20/2016 146 (H)  12/17/2014 138   Potassium (mmol/L)  Date Value  09/27/2018 3.4 (L)  12/17/2014 3.7   Magnesium (mg/dL)  Date Value  09/25/2018 2.0  12/17/2014 1.8   Phosphorus (mg/dL)  Date Value  09/25/2018 3.0   Calcium (mg/dL)  Date Value  09/27/2018 8.6 (L)   Calcium, Total (mg/dL)  Date Value  12/17/2014 7.9 (L)   Albumin (g/dL)  Date Value  09/19/2018 3.6  10/20/2016 4.5  12/17/2014 3.2 (L)    Assessment/Plan: Patient received potassium 84mEq X 1. Will order additional potassium 20 mEq VT x 2 for total replacement of 70 mEq.   Pharmacy will continue to monitor and adjust per consult.   MLS 09/27/2018 9:05 AM
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
## 2018-09-28 ENCOUNTER — Inpatient Hospital Stay: Payer: PPO

## 2018-09-28 LAB — GLUCOSE, CAPILLARY
GLUCOSE-CAPILLARY: 112 mg/dL — AB (ref 70–99)
Glucose-Capillary: 122 mg/dL — ABNORMAL HIGH (ref 70–99)
Glucose-Capillary: 124 mg/dL — ABNORMAL HIGH (ref 70–99)
Glucose-Capillary: 167 mg/dL — ABNORMAL HIGH (ref 70–99)
Glucose-Capillary: 158 mg/dL — ABNORMAL HIGH (ref 70–99)

## 2018-09-28 LAB — BASIC METABOLIC PANEL
ANION GAP: 6 (ref 5–15)
BUN: 13 mg/dL (ref 8–23)
CO2: 24 mmol/L (ref 22–32)
Calcium: 8.4 mg/dL — ABNORMAL LOW (ref 8.9–10.3)
Chloride: 113 mmol/L — ABNORMAL HIGH (ref 98–111)
Creatinine, Ser: 0.42 mg/dL — ABNORMAL LOW (ref 0.61–1.24)
GFR calc Af Amer: >60 mL/min (ref >60)
GFR calc non Af Amer: >60 mL/min (ref >60)
Glucose, Bld: 125 mg/dL — ABNORMAL HIGH (ref 70–99)
Potassium: 3.6 mmol/L (ref 3.5–5.1)
SODIUM: 143 mmol/L (ref 135–145)

## 2018-09-28 LAB — MAGNESIUM: MAGNESIUM: 2.1 mg/dL (ref 1.7–2.4)

## 2018-09-28 LAB — PHOSPHORUS: Phosphorus: 3 mg/dL (ref 2.5–4.6)

## 2018-09-28 LAB — PROCALCITONIN: Procalcitonin: <0.1 ng/mL

## 2018-09-28 MED ORDER — GLUCERNA 1.5 CAL PO LIQD
1000.0000 mL | ORAL | Status: DC
  Administered 2018-09-28 (×2): 1000 mL

## 2018-09-28 MED ORDER — AMOXICILLIN-POT CLAVULANATE 875-125 MG PO TABS
1.0000 | ORAL_TABLET | Freq: Two times a day (BID) | ORAL | Status: DC
  Administered 2018-09-28 – 2018-09-29 (×3): 1
  Filled 2018-09-28 (×4): qty 1

## 2018-09-28 MED ORDER — POTASSIUM CHLORIDE 20 MEQ PO PACK: 20.0000 meq | PACK | Freq: Once | ORAL | Status: DC

## 2018-09-28 MED ORDER — METOCLOPRAMIDE HCL 5 MG PO TABS
5.0000 mg | ORAL_TABLET | Freq: Three times a day (TID) | ORAL | Status: DC
  Administered 2018-09-28 – 2018-09-29 (×2): 5 mg
  Filled 2018-09-28 (×4): qty 1

## 2018-09-28 MED ORDER — POTASSIUM CHLORIDE 20 MEQ PO PACK
20.0000 meq | PACK | ORAL | Status: AC
  Administered 2018-09-28 (×2): 20 meq
  Filled 2018-09-28 (×2): qty 1
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
## 2018-09-28 NOTE — Progress Notes (Signed)
Nutrition Follow-up  DOCUMENTATION CODES:   Severe malnutrition in context of acute illness/injury  INTERVENTION:  Received verbal order from Attending to increase tube feeds to 30 mL/hr.  Advanced tube feed orders to Glucerna 1.5 Cal at 30 mL/hr. If patient tolerates, recommend to continue advancing towards goal of 55 mL/hr tomorrow.  Goal regimen provides 1980 kcal, 109 grams of protein, 1003 mL H2O daily.  Goal TF regimen meets 100% RDIs for vitamins/minerals.  Recommend free water flush of 180 mL Q4hrs.  NUTRITION DIAGNOSIS:   Severe Malnutrition related to acute illness(dysphagia s/p placement of G-tube for tube feeding, catabolic nature of critical illness, catabolic nature of CHF and CKD) as evidenced by moderate fat depletion, moderate muscle depletion, percent weight loss.  Ongoing.  GOAL:   Provide needs based on ASPEN/SCCM guidelines  Progressing.  MONITOR:   Vent status, Labs, Weight trends, TF tolerance, Skin, I & O's  REASON FOR ASSESSMENT:   Ventilator    ASSESSMENT:   63 year old male with PMHx of seizures, dementia, arthritis, HTN, DM, malignant intraductal papillary mucinous tumor of pancreas, hernia of abdominal cavity, hx HSV encephalitis,  hx MI, hx TIA, CAD, CHF, GERD, pancreatitis, CKD, recent admission 07/22/2018-09/08/2018 for acute encephalopathy, acute hypoxic respiratory failure requiring intubation 12/7-12/11 and 12/17-12/23, and dysphagia s/p placement of G-tube who now presents from [facility] with severe hypoxic and hypercapnic respiratory from aspiration PNA requiring intubation on 1/28, also with questionable G-tube malposition.   -GI assessed patient on 1/29. Gastrografin tube study showed gastrostomy tube was in place in gastric cavity so tube feeds were initiated. -Patient was extubated on 1/31.  Patient vomited on early AM of 2/5. Chest x-ray was repeated yesterday morning and this morning and no aspiration noted. Plan is for  patient to possibly discharge back to rehab today. Discussed with Attending and plan is to slowly start advancing tube feeds. Patient may be worked-up in outpatient for G-through J extension, but even this may not reduce risk of aspiration and these tubes have a high rate of issues (clogging, J-extension migrating back into stomach). Per chart patient with 3x UOP yesterday and 1x BM.  Access: 18 Fr. G-tube present on admission; terminates in stomach per Gastrografin study 1/29  Medications reviewed and include: Augmentin, cholecalciferol 1000 units daily per tube, famotidine, free water flush 30 mL Q4hrs, Novolog 0-15 units Q4hrs, Novolog 3 units Q4hrs, Lantus 4 units QHS.  Labs reviewed: CBG 122-167, Chloride 113, Creatinine 0.42.  Discussed with RN and on rounds.  Diet Order:   Diet Order            Diet NPO time specified  Diet effective now             EDUCATION NEEDS:   Not appropriate for education at this time  Skin:  Skin Assessment: Skin Integrity Issues:(MSAD to buttocks, coccyx; ecchymosis to bilateral arms)  Last BM:  09/27/2018 - large type 6  Height:   Ht Readings from Last 1 Encounters:  09/19/18 5\' 6"  (1.676 m)   Weight:   Wt Readings from Last 1 Encounters:  09/28/18 56.6 kg   Ideal Body Weight:  64.5 kg  BMI:  Body mass index is 20.14 kg/m.  Estimated Nutritional Needs:   Kcal:  1755-2025 (MSJ x 1.3-1.5)  Protein:  90-108 grams (1.5-1.8 grams/kg)  Fluid:  1.8 L/day (30 mL/kg)  Willey Blade, MS, RD, LDN Office: [phone] Pager: [phone] After Hours/Weekend Pager: [phone]

## 2018-09-28 NOTE — Progress Notes (Signed)
Pharmacy Consult for Unasyn Dosing.  Indication: Pneumonia.  Pharmacy Antibiotic Note-Day 7 of Unasyn  Maurice Patterson is a 63 y.o. male admitted on 09/19/2018 with sepsis secondary to aspiration pneumonia from [facility]. Pt was in ICU 2 weeks ago for aspiration pneumonia. Is currently admitted back into ICU for aspiration pneumonia. PMH consists of acute MI, CAD, CHF, CKD, dementia, DM, GERD, hyperlipidemia. Pt is currently intubated.   Pharmacy has been consulted for Unasyn dosing.  Plan: Continue Unasyn 3g IV q6h  Height: 5\' 6"  (167.6 cm) Weight: 130 lb 1.1 oz (59 kg) IBW/kg (Calculated) : 63.8  Temp (24hrs), Avg:100 F (37.8 C), Min:99.3 F (37.4 C), Max:101.1 F (38.4 C)  Recent Labs  Lab 09/19/18 2003 09/19/18 2306 09/20/18 0449 09/21/18 0507  WBC 12.7*  --  9.4 8.7  CREATININE 0.53*  --  0.50* 0.37*  LATICACIDVEN 2.6* 1.6  --   --     Estimated Creatinine Clearance: 80.9 mL/min (A) (by C-G formula based on SCr of 0.37 mg/dL (L)).    Allergies  Allergen Reactions  . Hydrocodone Anaphylaxis  . Morphine Other (See Comments)    Loss of memory  . Ambien [Zolpidem] Other (See Comments)    delirium    . Brilinta [Ticagrelor] Other (See Comments)    Stroke   . Flexeril [Cyclobenzaprine] Other (See Comments)    delerium   . Flunitrazepam Other (See Comments)    ROHYPNOL (hallucinations)  . Haldol [Haloperidol Lactate] Other (See Comments)    delerium   . Levetiracetam Diarrhea and Other (See Comments)    Unable to walk  . Lorazepam Hives  . Risperdal [Risperidone] Other (See Comments)    Delirium   . Trazodone Other (See Comments)    Delirium Can take in low doses   . Benadryl [Diphenhydramine Hcl (Sleep)] Rash  . Penicillins Rash    Mouth ulcers Has patient had a PCN reaction causing immediate rash, facial/tongue/throat swelling, SOB or lightheadedness with hypotension: Yes Has patient had a PCN reaction causing severe rash involving mucus membranes  or skin necrosis: No Has patient had a PCN reaction that required hospitalization No Has patient had a PCN reaction occurring within the last 10 years: Yes If all of the above answers are "NO", then may proceed with Cephalosporin use.  . Vancomycin Rash    Rash around IV site during vancomycin infusion    Antimicrobials this admission: 1/31 Unasyn >> 1/29 Clindamycin >> 1/30 1/29 Cefepime >> 1/29 1/29 Vancomycin x 1 dose  Microbiology results: 1/28 BCx: no growth. 1/28 UCx: no growth. 1/28 Sputum: MSSA Resistant to erythromycin and  Clindamycin.  1/28 MRSA PCR: negative 2/4 BCx: NGTD   Thank you for allowing pharmacy to be a part of this patient's care.  Paulina Fusi, PharmD, BCPS 09/28/2018 9:44 AM

## 2018-09-28 NOTE — Progress Notes (Signed)
Pharmacy Electrolyte Monitoring Consult:  Pharmacy consulted to assist in monitoring and replacing electrolytes in this 63 y.o. male admitted on 09/19/2018 with Respiratory Distress   Labs:  Sodium (mmol/L)  Date Value  09/28/2018 143  10/20/2016 146 (H)  12/17/2014 138   Potassium (mmol/L)  Date Value  09/28/2018 3.6  12/17/2014 3.7   Magnesium (mg/dL)  Date Value  09/28/2018 2.1  12/17/2014 1.8   Phosphorus (mg/dL)  Date Value  09/28/2018 3.0   Calcium (mg/dL)  Date Value  09/28/2018 8.4 (L)   Calcium, Total (mg/dL)  Date Value  12/17/2014 7.9 (L)   Albumin (g/dL)  Date Value  09/19/2018 3.6  10/20/2016 4.5  12/17/2014 3.2 (L)    Assessment/Plan: Will order additional potassium 20 mEq VT x 2 for total replacement of 40 mEq.   Pharmacy will continue to monitor and adjust per consult.   Paulina Fusi, PharmD, BCPS 09/28/2018 9:45 AM

## 2018-09-28 NOTE — Care Management Note (Signed)
Case Management Note  Patient Details  Name: KHALIF STENDER MRN: [id number] Date of Birth: [birth year]  Subjective/Objective:  RNCM reached out to hospitalist covering and confirmed plan for discharge to [place] tomorrow.  LCSW is aware.   Doran Clay RN BSN  [phone]                 Action/Plan:   Expected Discharge Date:                  Expected Discharge Plan:     In-House Referral:  Clinical Social Work  Discharge planning Services  CM Consult  Post Acute Care Choice:  Long Term Acute Care (LTAC) Choice offered to:  Patient, Spouse, Sibling  DME Arranged:    DME Agency:     HH Arranged:    [facility] Agency:     Status of Service:  Completed, signed off  If discussed at [employer] of Stay Meetings, dates discussed:    Additional Comments:  Shelbie Hutching, RN 09/28/2018, 2:27 PM

## 2018-09-28 NOTE — Progress Notes (Signed)
Physical Therapy Treatment Patient Details Name: Maurice Patterson MRN: [id number] DOB: Feb 22, 1956 Today's Date: 09/28/2018    History of Present Illness 63 y.o. male. admitted with Acute Hypoxic Respiratory Failure in setting of Aspiration, Sepsis, and Altered Mental Status.  He required intubation in the ED, extubated 09/22/2018. Peg tube placed. Old small RIGHT cerebellar infarct and mild chronic small vessel ischemic changes found on workup. PMH consists of acute MI, CAD, CHF, CKD, dementia, DM, GERD, hyperlipidemia and recent hospital admissions.    PT Comments    Patient significantly more alert this session, able to keep eyes open and stay engaged throughout treatment. The patient was able to pull on PT hand to initiate rolling/supine to sit to participate more fully, however still maxAx2, including for safety. The patient was able to progress from modAx2 to maintain sitting EOB to several instances of CGA x2 and R knee block. Pt also had significantly improved bed level exercises, tactile and verbal cues throughout, but 80% AROM for LE. Overall the patient demonstrated excellent participation with therapy today, and would benefit from further skilled PT intervention to maximize mobility, independence, and safety. Patient reported/exhibited fatigue signs after sitting EOB, returned maxAx2, in NAD.      Follow Up Recommendations  SNF     Equipment Recommendations  None recommended by PT;Other (comment)(TBD)    Recommendations for Other Services OT consult;Speech consult     Precautions / Restrictions Precautions Precautions: Fall;Other (comment) Precaution Comments: peg tube Restrictions Weight Bearing Restrictions: No    Mobility  Bed Mobility Overal bed mobility: Needs Assistance Bed Mobility: Rolling Rolling: +2 for physical assistance;Max assist;+2 for safety/equipment   Supine to sit: Max assist;+2 for physical assistance Sit to supine: Max assist;+2 for physical  assistance   General bed mobility comments: Patient with improved participation to initiate movement this session, able to pull on PT's hand to initiate roll, and for trunk elevation, but remains maxX2 for safety.  Transfers                 General transfer comment: deferred at this time, safety concerns, fatigue  Ambulation/Gait                 Stairs             Wheelchair Mobility    Modified Rankin (Stroke Patients Only)       Balance Overall balance assessment: Needs assistance Sitting-balance support: Feet supported Sitting balance-Leahy Scale: Poor Sitting balance - Comments: Patient able to perform sitting balance with CGA 2-3seconds with knee block, quickly fatigued but demonstrated significant improvement in participation                                    Cognition Arousal/Alertness: Awake/alert Behavior During Therapy: Flat affect Overall Cognitive Status: No family/caregiver present to determine baseline cognitive functioning                                 General Comments: oriented to self, able to answer ~75% simple questions appropriately with time to respond, attends to 1 step simple commands with increased time and cues      Exercises General Exercises - Lower Extremity Short Arc Quad: AROM;Both;10 reps Heel Slides: AROM;Strengthening;Both;10 reps Hip ABduction/ADduction: AAROM;Strengthening;Both;10 reps Straight Leg Raises: AROM;Strengthening;Both;10 reps    General Comments  Pertinent Vitals/Pain Pain Assessment: No/denies pain    Home Living                      Prior Function            PT Goals (current goals can now be found in the care plan section) Progress towards PT goals: Progressing toward goals    Frequency    Min 2X/week      PT Plan Current plan remains appropriate    Co-evaluation              AM-PAC PT "6 Clicks" Mobility   Outcome  Measure  Help needed turning from your back to your side while in a flat bed without using bedrails?: Total Help needed moving from lying on your back to sitting on the side of a flat bed without using bedrails?: Total Help needed moving to and from a bed to a chair (including a wheelchair)?: Total Help needed standing up from a chair using your arms (e.g., wheelchair or bedside chair)?: Total Help needed to walk in hospital room?: Total Help needed climbing 3-5 steps with a railing? : Total 6 Click Score: 6    End of Session Equipment Utilized During Treatment: Gait belt Activity Tolerance: Patient limited by fatigue;Patient tolerated treatment well Patient left: in bed;with call bell/phone within reach;with family/visitor present Nurse Communication: Mobility status PT Visit Diagnosis: Other abnormalities of gait and mobility (R26.89);Muscle weakness (generalized) (M62.81)     Time: 1657-9038 PT Time Calculation (min) (ACUTE ONLY): 18 min  Charges:  $Therapeutic Exercise: 8-22 mins                     Lieutenant Diego PT, DPT 11:30 AM,09/28/18 [phone]

## 2018-09-28 NOTE — Progress Notes (Signed)
Requested delivery of Glucernia (for tube feeding) via [employer].

## 2018-09-29 DIAGNOSIS — E43 Unspecified severe protein-calorie malnutrition: Secondary | ICD-10-CM | POA: Diagnosis not present

## 2018-09-29 DIAGNOSIS — E11610 Type 2 diabetes mellitus with diabetic neuropathic arthropathy: Secondary | ICD-10-CM | POA: Diagnosis not present

## 2018-09-29 DIAGNOSIS — R069 Unspecified abnormalities of breathing: Secondary | ICD-10-CM | POA: Diagnosis not present

## 2018-09-29 DIAGNOSIS — I509 Heart failure, unspecified: Secondary | ICD-10-CM | POA: Diagnosis not present

## 2018-09-29 DIAGNOSIS — J690 Pneumonitis due to inhalation of food and vomit: Secondary | ICD-10-CM | POA: Diagnosis not present

## 2018-09-29 DIAGNOSIS — R262 Difficulty in walking, not elsewhere classified: Secondary | ICD-10-CM | POA: Diagnosis not present

## 2018-09-29 DIAGNOSIS — R1310 Dysphagia, unspecified: Secondary | ICD-10-CM | POA: Diagnosis not present

## 2018-09-29 DIAGNOSIS — E860 Dehydration: Secondary | ICD-10-CM | POA: Diagnosis not present

## 2018-09-29 DIAGNOSIS — J9690 Respiratory failure, unspecified, unspecified whether with hypoxia or hypercapnia: Secondary | ICD-10-CM | POA: Diagnosis not present

## 2018-09-29 DIAGNOSIS — A0472 Enterocolitis due to Clostridium difficile, not specified as recurrent: Secondary | ICD-10-CM | POA: Diagnosis not present

## 2018-09-29 DIAGNOSIS — R7989 Other specified abnormal findings of blood chemistry: Secondary | ICD-10-CM | POA: Diagnosis not present

## 2018-09-29 DIAGNOSIS — R1312 Dysphagia, oropharyngeal phase: Secondary | ICD-10-CM | POA: Diagnosis not present

## 2018-09-29 DIAGNOSIS — I69991 Dysphagia following unspecified cerebrovascular disease: Secondary | ICD-10-CM | POA: Diagnosis not present

## 2018-09-29 DIAGNOSIS — J189 Pneumonia, unspecified organism: Secondary | ICD-10-CM | POA: Diagnosis not present

## 2018-09-29 DIAGNOSIS — E876 Hypokalemia: Secondary | ICD-10-CM | POA: Diagnosis not present

## 2018-09-29 DIAGNOSIS — E1122 Type 2 diabetes mellitus with diabetic chronic kidney disease: Secondary | ICD-10-CM | POA: Diagnosis not present

## 2018-09-29 DIAGNOSIS — R0689 Other abnormalities of breathing: Secondary | ICD-10-CM | POA: Diagnosis not present

## 2018-09-29 DIAGNOSIS — E1165 Type 2 diabetes mellitus with hyperglycemia: Secondary | ICD-10-CM | POA: Diagnosis not present

## 2018-09-29 DIAGNOSIS — D649 Anemia, unspecified: Secondary | ICD-10-CM | POA: Diagnosis not present

## 2018-09-29 DIAGNOSIS — R0602 Shortness of breath: Secondary | ICD-10-CM | POA: Diagnosis not present

## 2018-09-29 DIAGNOSIS — B379 Candidiasis, unspecified: Secondary | ICD-10-CM | POA: Diagnosis not present

## 2018-09-29 DIAGNOSIS — K5909 Other constipation: Secondary | ICD-10-CM | POA: Diagnosis not present

## 2018-09-29 DIAGNOSIS — Z7902 Long term (current) use of antithrombotics/antiplatelets: Secondary | ICD-10-CM | POA: Diagnosis not present

## 2018-09-29 DIAGNOSIS — R000 Tachycardia, unspecified: Secondary | ICD-10-CM | POA: Diagnosis not present

## 2018-09-29 DIAGNOSIS — B370 Candidal stomatitis: Secondary | ICD-10-CM | POA: Diagnosis not present

## 2018-09-29 DIAGNOSIS — I2510 Atherosclerotic heart disease of native coronary artery without angina pectoris: Secondary | ICD-10-CM | POA: Diagnosis not present

## 2018-09-29 DIAGNOSIS — Z794 Long term (current) use of insulin: Secondary | ICD-10-CM | POA: Diagnosis not present

## 2018-09-29 DIAGNOSIS — G309 Alzheimer's disease, unspecified: Secondary | ICD-10-CM | POA: Diagnosis not present

## 2018-09-29 DIAGNOSIS — R319 Hematuria, unspecified: Secondary | ICD-10-CM | POA: Diagnosis not present

## 2018-09-29 DIAGNOSIS — R6889 Other general symptoms and signs: Secondary | ICD-10-CM | POA: Diagnosis not present

## 2018-09-29 DIAGNOSIS — R0902 Hypoxemia: Secondary | ICD-10-CM | POA: Diagnosis not present

## 2018-09-29 DIAGNOSIS — A419 Sepsis, unspecified organism: Secondary | ICD-10-CM | POA: Diagnosis not present

## 2018-09-29 DIAGNOSIS — I130 Hypertensive heart and chronic kidney disease with heart failure and stage 1 through stage 4 chronic kidney disease, or unspecified chronic kidney disease: Secondary | ICD-10-CM | POA: Diagnosis not present

## 2018-09-29 DIAGNOSIS — I10 Essential (primary) hypertension: Secondary | ICD-10-CM | POA: Diagnosis not present

## 2018-09-29 DIAGNOSIS — R066 Hiccough: Secondary | ICD-10-CM | POA: Diagnosis not present

## 2018-09-29 DIAGNOSIS — M2550 Pain in unspecified joint: Secondary | ICD-10-CM | POA: Diagnosis not present

## 2018-09-29 DIAGNOSIS — J80 Acute respiratory distress syndrome: Secondary | ICD-10-CM | POA: Diagnosis not present

## 2018-09-29 DIAGNOSIS — J9601 Acute respiratory failure with hypoxia: Secondary | ICD-10-CM | POA: Diagnosis not present

## 2018-09-29 DIAGNOSIS — E119 Type 2 diabetes mellitus without complications: Secondary | ICD-10-CM | POA: Diagnosis not present

## 2018-09-29 DIAGNOSIS — R404 Transient alteration of awareness: Secondary | ICD-10-CM | POA: Diagnosis not present

## 2018-09-29 DIAGNOSIS — R4182 Altered mental status, unspecified: Secondary | ICD-10-CM | POA: Diagnosis not present

## 2018-09-29 DIAGNOSIS — F0390 Unspecified dementia without behavioral disturbance: Secondary | ICD-10-CM | POA: Diagnosis not present

## 2018-09-29 DIAGNOSIS — Z7401 Bed confinement status: Secondary | ICD-10-CM | POA: Diagnosis not present

## 2018-09-29 DIAGNOSIS — F0280 Dementia in other diseases classified elsewhere without behavioral disturbance: Secondary | ICD-10-CM | POA: Diagnosis not present

## 2018-09-29 DIAGNOSIS — N390 Urinary tract infection, site not specified: Secondary | ICD-10-CM | POA: Diagnosis not present

## 2018-09-29 DIAGNOSIS — K219 Gastro-esophageal reflux disease without esophagitis: Secondary | ICD-10-CM | POA: Diagnosis not present

## 2018-09-29 DIAGNOSIS — J9600 Acute respiratory failure, unspecified whether with hypoxia or hypercapnia: Secondary | ICD-10-CM | POA: Diagnosis not present

## 2018-09-29 DIAGNOSIS — Z87891 Personal history of nicotine dependence: Secondary | ICD-10-CM | POA: Diagnosis not present

## 2018-09-29 DIAGNOSIS — N189 Chronic kidney disease, unspecified: Secondary | ICD-10-CM | POA: Diagnosis not present

## 2018-09-29 DIAGNOSIS — F0391 Unspecified dementia with behavioral disturbance: Secondary | ICD-10-CM | POA: Diagnosis not present

## 2018-09-29 DIAGNOSIS — L89152 Pressure ulcer of sacral region, stage 2: Secondary | ICD-10-CM | POA: Diagnosis not present

## 2018-09-29 DIAGNOSIS — R41841 Cognitive communication deficit: Secondary | ICD-10-CM | POA: Diagnosis not present

## 2018-09-29 DIAGNOSIS — R0681 Apnea, not elsewhere classified: Secondary | ICD-10-CM | POA: Diagnosis not present

## 2018-09-29 DIAGNOSIS — R498 Other voice and resonance disorders: Secondary | ICD-10-CM | POA: Diagnosis not present

## 2018-09-29 DIAGNOSIS — Z931 Gastrostomy status: Secondary | ICD-10-CM | POA: Diagnosis not present

## 2018-09-29 DIAGNOSIS — M6281 Muscle weakness (generalized): Secondary | ICD-10-CM | POA: Diagnosis not present

## 2018-09-29 DIAGNOSIS — L89899 Pressure ulcer of other site, unspecified stage: Secondary | ICD-10-CM | POA: Diagnosis not present

## 2018-09-29 LAB — BASIC METABOLIC PANEL
Anion gap: 9 (ref 5–15)
BUN: 13 mg/dL (ref 8–23)
CO2: 24 mmol/L (ref 22–32)
Calcium: 8.6 mg/dL — ABNORMAL LOW (ref 8.9–10.3)
Chloride: 111 mmol/L (ref 98–111)
Creatinine, Ser: 0.46 mg/dL — ABNORMAL LOW (ref 0.61–1.24)
GFR calc Af Amer: >60 mL/min (ref >60)
GFR calc non Af Amer: >60 mL/min (ref >60)
Glucose, Bld: 186 mg/dL — ABNORMAL HIGH (ref 70–99)
Potassium: 3.7 mmol/L (ref 3.5–5.1)
SODIUM: 144 mmol/L (ref 135–145)

## 2018-09-29 LAB — GLUCOSE, CAPILLARY
GLUCOSE-CAPILLARY: 152 mg/dL — AB (ref 70–99)
Glucose-Capillary: 189 mg/dL — ABNORMAL HIGH (ref 70–99)
Glucose-Capillary: 113 mg/dL — ABNORMAL HIGH (ref 70–99)

## 2018-09-29 LAB — MAGNESIUM: MAGNESIUM: 2 mg/dL (ref 1.7–2.4)

## 2018-09-29 MED ORDER — METOCLOPRAMIDE HCL 5 MG PO TABS: 5.0000 mg | ORAL_TABLET | Freq: Three times a day (TID) | ORAL | 0 refills | Status: DC

## 2018-09-29 MED ORDER — INSULIN GLARGINE 100 UNIT/ML SOLOSTAR PEN: 4.0000 [IU] | PEN_INJECTOR | Freq: Every day | SUBCUTANEOUS | 11 refills | Status: DC

## 2018-09-29 MED ORDER — GLUCERNA 1.5 CAL PO LIQD: 1000.0000 mL | ORAL | Status: DC

## 2018-09-29 MED ORDER — AMOXICILLIN-POT CLAVULANATE 875-125 MG PO TABS: 1.0000 | ORAL_TABLET | Freq: Two times a day (BID) | ORAL | 0 refills | Status: AC

## 2018-09-29 MED ORDER — FREE WATER: 180.0000 mL | Status: DC

## 2018-09-29 NOTE — Care Management Important Message (Signed)
Patient discharged prior to arrival on unit.  Unable to deliver concurrent copy of Medicare IM.

## 2018-09-29 NOTE — Discharge Summary (Addendum)
[facility] NAME: Maurice Patterson    MR#:  [id number]  DATE OF BIRTH:  1956-05-13  DATE OF ADMISSION:  09/19/2018 ADMITTING PHYSICIAN: Lance Coon, MD  DATE OF DISCHARGE: 09/28/2018  PRIMARY CARE PHYSICIAN: [name], MD    ADMISSION DIAGNOSIS:  Altered mental status, unspecified altered mental status type [R41.82] Aspiration pneumonia, unspecified aspiration pneumonia type, unspecified laterality, unspecified part of lung ([place]) [J69.0] Sepsis, due to unspecified organism, unspecified whether acute organ dysfunction present ([place]) [A41.9]  DISCHARGE DIAGNOSIS:   Aspiration Pneumonia Sepsis on admission--resolved SECONDARY DIAGNOSIS:   Past Medical History:  Diagnosis Date  . Acute MI ([place])   . Arthritis   . Back pain   . CAD (coronary artery disease)   . CHF (congestive heart failure) ([place])   . Chronic kidney disease   . Degenerative lumbar disc   . Dementia ([employer])   . Diabetes mellitus without complication ([place])   . GERD (gastroesophageal reflux disease)   . Hernia of abdominal cavity   . Hyperlipemia   . Hypertension   . Malignant intraductal papillary mucinous tumor of pancreas ([place])   . Memory loss   . Pancreatitis   . Seizures ([place])    staring spells  . Shingles   . Stroke ([place]) 08/2017  . TIA (transient ischemic attack)     HOSPITAL COURSE:  Maurice Patterson  is a 63 y.o. male who presents with chief complaint as above.  Patient presents to the ED after an episode of aspiration pneumonia with subsequent respiratory failure.  He was recently admitted at this facility for an extended period of time due to community-acquired pneumonia initially  1. Clinical sepsis due to aspiration pneumonia.Likely had another aspiration event overnight.Patient on IV Unasyn.--change to augmentin (liquid)Culture growing out sensitive staph aureus. Patient is a high risk for continue aspiration. Try to restart tube  feeds--advance slowly. Speech and dietitian to follow at SNF. MBSS done on 09/25/2018. Follow speech and dietitan recommendations at snf. Dietitian recommends pt be on CONTINUOUS Tube feeding at the facilty to prevent aspirations 2. Acute hypoxic respiratory failure. Patient extubated and now breathing comfortably on room air. 3. Type 2 diabetes mellitus on low-dose glargine insulin and aspart insulin. 4. Severe malnutrition on PEG tube feedings.Restarted tube feedingsand titrate up to goal as tolerated 5. History of dementia on Namenda and Aricept 6. Gastric ulceration on Pepcid 7. Deep tissue injury with purplish discoloration on buttock 8. Chronic dysphagia--pt had MBSS on 09/25/2018. He will need aggressive oral care and speech therapy f/u at [facility]  Pt will d/c to [facility] today  CONSULTS OBTAINED:    DRUG ALLERGIES:   Allergies  Allergen Reactions  . Hydrocodone Anaphylaxis  . Morphine Other (See Comments)    Loss of memory  . Ambien [Zolpidem] Other (See Comments)    delirium    . Brilinta [Ticagrelor] Other (See Comments)    Stroke   . Flexeril [Cyclobenzaprine] Other (See Comments)    delerium   . Flunitrazepam Other (See Comments)    ROHYPNOL (hallucinations)  . Haldol [Haloperidol Lactate] Other (See Comments)    delerium   . Levetiracetam Diarrhea and Other (See Comments)    Unable to walk  . Lorazepam Hives  . Risperdal [Risperidone] Other (See Comments)    Delirium   . Trazodone Other (See Comments)    Delirium Can take in low doses   . Benadryl [Diphenhydramine Hcl (Sleep)] Rash  . Penicillins Rash  Mouth ulcers Has patient had a PCN reaction causing immediate rash, facial/tongue/throat swelling, SOB or lightheadedness with hypotension: Yes Has patient had a PCN reaction causing severe rash involving mucus membranes or skin necrosis: No Has patient had a PCN reaction that required hospitalization No Has patient had a PCN reaction occurring within the last  10 years: Yes If all of the above answers are "NO", then may proceed with Cephalosporin use.  . Vancomycin Rash    Rash around IV site during vancomycin infusion    DISCHARGE MEDICATIONS:   Allergies as of 09/29/2018      Reactions   Hydrocodone Anaphylaxis   Morphine Other (See Comments)   Loss of memory   Ambien [zolpidem] Other (See Comments)   delirium    Brilinta [ticagrelor] Other (See Comments)   Stroke   Flexeril [cyclobenzaprine] Other (See Comments)   delerium   Flunitrazepam Other (See Comments)   ROHYPNOL (hallucinations)   Haldol [haloperidol Lactate] Other (See Comments)   delerium   Levetiracetam Diarrhea, Other (See Comments)   Unable to walk   Lorazepam Hives   Risperdal [risperidone] Other (See Comments)   Delirium   Trazodone Other (See Comments)   Delirium Can take in low doses    Benadryl [diphenhydramine Hcl (sleep)] Rash   Penicillins Rash   Mouth ulcers Has patient had a PCN reaction causing immediate rash, facial/tongue/throat swelling, SOB or lightheadedness with hypotension: Yes Has patient had a PCN reaction causing severe rash involving mucus membranes or skin necrosis: No Has patient had a PCN reaction that required hospitalization No Has patient had a PCN reaction occurring within the last 10 years: Yes If all of the above answers are "NO", then may proceed with Cephalosporin use.   Vancomycin Rash   Rash around IV site during vancomycin infusion      Medication List    STOP taking these medications   insulin glargine 100 UNIT/ML injection Commonly known as:  LANTUS Replaced by:  Insulin Glargine 100 UNIT/ML Solostar Pen     TAKE these medications   acetaminophen 500 MG tablet Commonly known as:  TYLENOL Place 1 tablet (500 mg total) into feeding tube every 6 (six) hours as needed for mild pain.   albuterol (2.5 MG/3ML) 0.083% nebulizer solution Commonly known as:  PROVENTIL Take 3 mLs (2.5 mg total) by nebulization every 3  (three) hours as needed for wheezing or shortness of breath.   amoxicillin-clavulanate 875-125 MG tablet Commonly known as:  AUGMENTIN Place 1 tablet into feeding tube every 12 (twelve) hours for 2 days.   aspirin 81 MG chewable tablet Place 1 tablet (81 mg total) into feeding tube daily.   cholecalciferol 10 MCG/ML Liqd Commonly known as:  D-VI-SOL Place 2.5 mLs (1,000 Units total) into feeding tube daily.   clopidogrel 75 MG tablet Commonly known as:  PLAVIX Place 1 tablet (75 mg total) into feeding tube daily.   donepezil 10 MG tablet Commonly known as:  ARICEPT Place 1 tablet (10 mg total) into feeding tube 2 (two) times daily.   feeding supplement (GLUCERNA 1.5 CAL) Liqd Place 1,000 mLs into feeding tube continuous.   free water Soln Place 180 mLs into feeding tube every 4 (four) hours.   insulin aspart 100 UNIT/ML injection Commonly known as:  novoLOG Inject 3 Units into the skin every 6 (six) hours.   Insulin Glargine 100 UNIT/ML Solostar Pen Commonly known as:  LANTUS SOLOSTAR Inject 4 Units into the skin at bedtime. Replaces:  insulin glargine  100 UNIT/ML injection   memantine 10 MG tablet Commonly known as:  NAMENDA Place 1 tablet (10 mg total) into feeding tube 2 (two) times daily.   metoCLOPramide 5 MG tablet Commonly known as:  REGLAN Place 1 tablet (5 mg total) into feeding tube 4 (four) times daily -  before meals and at bedtime.   metoprolol tartrate 25 MG tablet Commonly known as:  LOPRESSOR Place 1 tablet (25 mg total) into feeding tube 2 (two) times daily.   pantoprazole sodium 40 mg/20 mL Pack Commonly known as:  PROTONIX Place 20 mLs (40 mg total) into feeding tube daily.   potassium chloride 20 MEQ packet Commonly known as:  KLOR-CON Place 20 mEq into feeding tube daily.       If you experience worsening of your admission symptoms, develop shortness of breath, life threatening emergency, suicidal or homicidal thoughts you must seek  medical attention immediately by calling 911 or calling your MD immediately  if symptoms less severe.  You Must read complete instructions/literature along with all the possible adverse reactions/side effects for all the Medicines you take and that have been prescribed to you. Take any new Medicines after you have completely understood and accept all the possible adverse reactions/side effects.   Please note  You were cared for by a hospitalist during your hospital stay. If you have any questions about your discharge medications or the care you received while you were in the hospital after you are discharged, you can call the unit and asked to speak with the hospitalist on call if the hospitalist that took care of you is not available. Once you are discharged, your primary care physician will handle any further medical issues. Please note that NO REFILLS for any discharge medications will be authorized once you are discharged, as it is imperative that you return to your primary care physician (or establish a relationship with a primary care physician if you do not have one) for your aftercare needs so that they can reassess your need for medications and monitor your lab values. Today   SUBJECTIVE   No new issues per RN. Pt awake and doing well  VITAL SIGNS:  Blood pressure (!) 127/96, pulse 85, temperature (!) 97.5 F (36.4 C), temperature source Oral, resp. rate 20, height 5\' 6"  (1.676 m), weight 56.6 kg, SpO2 97 %.  I/O:  No intake or output data in the 24 hours ending 09/29/18 0816  PHYSICAL EXAMINATION:  GENERAL:  63 y.o.-year-old patient lying in the bed with no acute distress. Thin, cachectic EYES: Pupils equal, round, reactive to light and accommodation. No scleral icterus. Extraocular muscles intact.  HEENT: Head atraumatic, normocephalic. Oropharynx and nasopharynx clear.  NECK:  Supple, no jugular venous distention. No thyroid enlargement, no tenderness.  LUNGS: Normal breath  sounds bilaterally, no wheezing, rales,rhonchi or crepitation. No use of accessory muscles of respiration. PEG site OK CARDIOVASCULAR: S1, S2 normal. No murmurs, rubs, or gallops.  ABDOMEN: Soft, non-tender, non-distended. Bowel sounds present. No organomegaly or mass.  EXTREMITIES: No pedal edema, cyanosis, or clubbing.  NEUROLOGIC:limited due to dementia. Grossly nonfocal PSYCHIATRIC: The patient is alert and awake SKIN: No obvious rash, lesion, or ulcer.   DATA REVIEW:   CBC  Recent Labs  Lab 09/27/18 1020  WBC 8.7  HGB 12.4*  HCT 38.0*  PLT 318    Chemistries  Recent Labs  Lab 09/29/18 0329  NA 144  K 3.7  CL 111  CO2 24  GLUCOSE 186*  BUN  13  CREATININE 0.46*  CALCIUM 8.6*  MG 2.0    Microbiology Results   Recent Results (from the past 240 hour(s))  Blood Culture (routine x 2)     Status: None   Collection Time: 09/19/18  8:03 PM  Result Value Ref Range Status   Specimen Description BLOOD BLOOD RIGHT FOREARM  Final   Special Requests   Final    BOTTLES DRAWN AEROBIC AND ANAEROBIC Blood Culture results may not be optimal due to an excessive volume of blood received in culture bottles   Culture   Final    NO GROWTH 5 DAYS Performed at [facility], [address] [place], [place] [address]    Report Status 09/24/2018 FINAL  Final  Blood Culture (routine x 2)     Status: None   Collection Time: 09/19/18  8:03 PM  Result Value Ref Range Status   Specimen Description BLOOD LEFT ANTECUBITAL  Final   Special Requests   Final    BOTTLES DRAWN AEROBIC AND ANAEROBIC Blood Culture adequate volume   Culture   Final    NO GROWTH 5 DAYS Performed at [facility], [address], [place], [place] [address]    Report Status 09/24/2018 FINAL  Final  Urine culture     Status: None   Collection Time: 09/19/18  8:03 PM  Result Value Ref Range Status   Specimen Description   Final    URINE, RANDOM Performed at [facility], [address] [place], [place] [address]    Special Requests   Final    NONE Performed at [facility], [address], [place] [employer], [place] [address]    Culture   Final    NO GROWTH Performed at [facility], [place] [address], [place], [place] [address]    Report Status 09/21/2018 FINAL  Final  MRSA PCR Screening     Status: None   Collection Time: 09/19/18 10:52 PM  Result Value Ref Range Status   MRSA by PCR NEGATIVE NEGATIVE Final    Comment:        The GeneXpert MRSA Assay (FDA approved for NASAL specimens only), is one component of a comprehensive MRSA colonization surveillance program. It is not intended to diagnose MRSA infection nor to guide or monitor treatment for MRSA infections. Performed at [facility], [address] [place], [place] [address]   CULTURE, BLOOD (ROUTINE X 2) w Reflex to ID Panel     Status: None (Preliminary result)   Collection Time: 09/26/18  8:25 AM  Result Value Ref Range Status   Specimen Description BLOOD BLOOD RIGHT HAND  Final   Special Requests   Final    BOTTLES DRAWN AEROBIC AND ANAEROBIC Blood Culture adequate volume   Culture   Final    NO GROWTH 3 DAYS Performed at [facility], [address], [place], [place] [address]    Report Status PENDING  Incomplete  CULTURE, BLOOD (ROUTINE X 2) w Reflex to ID Panel     Status: None (Preliminary result)   Collection Time: 09/26/18  8:25 AM  Result Value Ref Range Status   Specimen Description BLOOD BLOOD LEFT HAND  Final   Special Requests   Final    BOTTLES DRAWN AEROBIC AND ANAEROBIC Blood Culture adequate volume   Culture   Final    NO GROWTH 3 DAYS Performed at [facility], [address], [place],  [address]    Report Status  PENDING  Incomplete    RADIOLOGY:  Dg Chest Port 1 View  Result Date: 09/28/2018 CLINICAL DATA:  Acute respiratory failure. EXAM: PORTABLE CHEST 1 VIEW COMPARISON:  09/27/2018 FINDINGS: Shallow inspiration.  Emphysematous changes and fibrosis in the lungs. Linear fibrosis or atelectasis in the left lung base. No airspace disease or consolidation. No blunting of costophrenic angles. No pneumothorax. Mediastinal contours appear intact. Normal heart size and pulmonary vascularity. Calcification of the aorta. Degenerative changes in the spine and shoulders. Old bilateral rib fractures. No change since prior study. IMPRESSION: Emphysematous changes and fibrosis in the lungs. Fibrosis or atelectasis in the left lung base. No evidence of active pulmonary disease. Electronically Signed   By: Lucienne Capers M.D.   On: 09/28/2018 03:35   Dg Chest Port 1 View  Result Date: 09/27/2018 CLINICAL DATA:  Aspiration in airway; history coronary artery disease post MI and coronary stenting, CHF, diabetes mellitus, hypertension, malignant intraductal papillary mucinous tumor of the pancreas. EXAM: PORTABLE CHEST 1 VIEW COMPARISON:  Portable exam 1004 hours compared to 09/26/2018 FINDINGS: Normal heart size post coronary stenting. Mediastinal contours and pulmonary vascularity normal. Emphysematous and bronchitic changes consistent with COPD. Chronic bibasilar atelectasis versus scarring greater on LEFT. No acute infiltrate, pleural effusion, or pneumothorax. Bones demineralized. IMPRESSION: COPD changes with bibasilar atelectasis versus scarring greater on LEFT. Electronically Signed   By: Lavonia Dana M.D.   On: 09/27/2018 11:06     CODE STATUS:     Code Status Orders  (From admission, onward)         Start     Ordered   09/19/18 2130  Full code  Continuous     09/19/18 2129        Code Status History    Date Active Date Inactive Code Status Order ID Comments User Context   07/23/2018 0122 09/08/2018 2029 Full Code [id number]  Lance Coon, MD Inpatient   04/05/2016 0304 04/05/2016 1851 Full Code [id number]  Harvie Bridge, DO Inpatient   10/19/2015 0103 10/20/2015 1102 Full Code [id number]  Harrie Foreman, MD  Inpatient   08/21/2015 1912 08/23/2015 1657 Full Code [id number]  Gladstone Lighter, MD Inpatient   02/19/2015 1627 02/20/2015 2054 Full Code [id number]  Henreitta Leber, MD Inpatient      TOTAL TIME TAKING CARE OF THIS PATIENT: *40* minutes.    Fritzi Mandes M.D on 09/29/2018 at 8:16 AM  Between 7am to 6pm - Pager - [phone] After 6pm go to [email or web address] - password [email or web address] [facility]  Office  [phone]  CC: Primary care physician; [name], MD

## 2018-09-29 NOTE — NC FL2 (Addendum)
[place] LEVEL OF CARE SCREENING TOOL     IDENTIFICATION  Patient Name: Maurice Patterson Birthdate: Oct 28, 1955 Sex: male Admission Date (Current Location): 09/19/2018  [place] and [place] Number:  Engineering geologist and Address:  [facility], [address], [place], [place] [address]      Provider Number: [id number]  Attending Physician Name and Address:  Fritzi Mandes, MD  Relative Name and Phone Number:       Current Level of Care: Hospital Recommended Level of Care: [place] Prior Approval Number:    Date Approved/Denied:   PASRR Number:    Discharge Plan: SNF    Current Diagnoses: Patient Active Problem List   Diagnosis Date Noted  . Protein-calorie malnutrition, severe 09/20/2018  . Diabetes ([place]) 09/19/2018  . Sepsis ([place]) 09/19/2018  . Pressure injury of skin 08/12/2018  . Aspiration pneumonia ([place])   . Palliative care encounter   . Dysphagia   . Acute respiratory failure with hypoxia ([place])   . Fever   . CAP (community acquired pneumonia) 07/22/2018  . GERD (gastroesophageal reflux disease) 07/22/2018  . Hypersalivation 01/30/2018  . Drooling 01/30/2018  . Alzheimer's dementia ([place]) 05/01/2017  . Cognitive decline 10/20/2016  . Chest pain, rule out acute myocardial infarction 04/05/2016  . Confusion 10/18/2015  . Constipation 08/23/2015  . Uncontrolled type 2 diabetes mellitus with hyperglycemia, with long-term current use of insulin ([place]) 08/23/2015  . Essential hypertension 08/23/2015  . Right lower lobe pneumonia ([place]) 08/23/2015  . Abdominal pain 08/21/2015  . Altered mental status 02/19/2015    Orientation RESPIRATION BLADDER Height & Weight     Self, Place  Normal Incontinent Weight: 124 lb 12.5 oz (56.6 kg) Height:  '5\' 6"'$  (167.6 cm)  BEHAVIORAL SYMPTOMS/MOOD NEUROLOGICAL BOWEL NUTRITION STATUS  (none) Convulsions/Seizures Incontinent Feeding tube  AMBULATORY STATUS COMMUNICATION OF  NEEDS Skin   Total Care Verbally PU Stage and Appropriate Care                       Personal Care Assistance Level of Assistance  Total care Bathing Assistance: Maximum assistance Feeding assistance: Maximum assistance Dressing Assistance: Maximum assistance Total Care Assistance: Maximum assistance   Functional Limitations Info  (no issues noted)          SPECIAL CARE FACTORS FREQUENCY  PT (By licensed PT), OT (By licensed OT), Speech therapy                    Contractures Contractures Info: Not present    Additional Factors Info  Code Status  Continuous Tube Feeds: ST to work with patient at facility regarding any changes in diet regimen Code Status Info: full             Current Medications (09/29/2018):  This is the current hospital active medication list Current Facility-Administered Medications  Medication Dose Route Frequency Provider Last Rate Last Dose  . acetaminophen (TYLENOL) suppository 650 mg  650 mg Rectal Q4H PRN Flora Lipps, MD   650 mg at 09/20/18 1348  . acetaminophen (TYLENOL) tablet 650 mg  650 mg Oral Q4H PRN Lance Coon, MD      . amoxicillin-clavulanate (AUGMENTIN) 875-125 MG per tablet 1 tablet  1 tablet Per Tube Q12H Fritzi Mandes, MD   1 tablet at 09/29/18 0854  . aspirin chewable tablet 81 mg  81 mg Per Tube Daily Charlett Nose, RPH   81 mg at 09/29/18 0853  .  chlorhexidine gluconate (MEDLINE KIT) (PERIDEX) 0.12 % solution 15 mL  15 mL Mouth Rinse BID Darel Hong D, NP   15 mL at 09/29/18 0901  . cholecalciferol (VITAMIN D3) tablet 1,000 Units  1,000 Units Per Tube Daily Charlett Nose, RPH   1,000 Units at 09/29/18 0854  . clopidogrel (PLAVIX) tablet 75 mg  75 mg Per Tube Daily Charlett Nose, RPH   75 mg at 09/29/18 0853  . donepezil (ARICEPT) tablet 10 mg  10 mg Per Tube QHS Charlett Nose, RPH   10 mg at 09/28/18 2151  . enoxaparin (LOVENOX) injection 40 mg  40 mg Subcutaneous Q24H Charlett Nose, RPH    40 mg at 09/28/18 2152  . famotidine (PEPCID) tablet 20 mg  20 mg Per Tube Daily Charlett Nose, RPH   20 mg at 09/29/18 0853  . feeding supplement (GLUCERNA 1.5 CAL) liquid 1,000 mL  1,000 mL Per Tube Continuous Fritzi Mandes, MD 30 mL/hr at 09/28/18 2306 1,000 mL at 09/28/18 2306  . free water 30 mL  30 mL Per Tube Q4H Loletha Grayer, MD   30 mL at 09/29/18 0854  . insulin aspart (novoLOG) injection 0-15 Units  0-15 Units Subcutaneous Q4H Flora Lipps, MD   3 Units at 09/29/18 0406  . insulin aspart (novoLOG) injection 3 Units  3 Units Subcutaneous Q4H Flora Lipps, MD   3 Units at 09/29/18 0406  . insulin glargine (LANTUS) injection 4 Units  4 Units Subcutaneous QHS Flora Lipps, MD   4 Units at 09/27/18 2106  . ipratropium-albuterol (DUONEB) 0.5-2.5 (3) MG/3ML nebulizer solution 3 mL  3 mL Nebulization Q4H PRN Darel Hong D, NP   3 mL at 09/21/18 0803  . lidocaine (LIDODERM) 5 % 1 patch  1 patch Transdermal Q24H Conforti, John, DO   1 patch at 09/28/18 1223  . memantine (NAMENDA) tablet 10 mg  10 mg Per Tube Daily Charlett Nose, RPH   10 mg at 09/29/18 0853  . metoCLOPramide (REGLAN) tablet 5 mg  5 mg Per Tube TID AC & HS Loletha Grayer, MD   5 mg at 09/29/18 0853  . ondansetron (ZOFRAN) injection 4 mg  4 mg Intravenous Q6H PRN Lance Coon, MD      . phenol (CHLORASEPTIC) mouth spray 2 spray  2 spray Mouth/Throat PRN Awilda Bill, NP         Discharge Medications: Please see discharge summary for a list of discharge medications.  Relevant Imaging Results:  Relevant Lab Results:   Additional Information    Shela Leff, LCSW

## 2018-09-29 NOTE — Discharge Instructions (Signed)
ASPIRATION PRECAUTIONS!! ORAL CARE SPEECH to follow at the facility Dietitian to manage Tube feeding at [facility]

## 2018-09-29 NOTE — Progress Notes (Signed)
Nutrition Follow-up  DOCUMENTATION CODES:   Severe malnutrition in context of acute illness/injury  INTERVENTION:   Increase Glucerna 1.5 Cal to goal of 55 mL/hr   Free water flush of 180 mL Q4hrs.  Goal regimen provides 1980 kcal, 109 grams of protein, 2003 mL H2O daily.  Goal TF regimen meets 100% RDIs for vitamins/minerals.  NUTRITION DIAGNOSIS:   Severe Malnutrition related to acute illness(dysphagia s/p placement of G-tube for tube feeding, catabolic nature of critical illness, catabolic nature of CHF and CKD) as evidenced by moderate fat depletion, moderate muscle depletion, percent weight loss.  Ongoing.  GOAL:   Patient will meet greater than or equal to 90% of their needs  Progressing.  MONITOR:   Labs, Weight trends, TF tolerance, Skin, I & O's  ASSESSMENT:   63 year old male with PMHx of seizures, dementia, arthritis, HTN, DM, malignant intraductal papillary mucinous tumor of pancreas, hernia of abdominal cavity, hx HSV encephalitis,  hx MI, hx TIA, CAD, CHF, GERD, pancreatitis, CKD, recent admission 07/22/2018-09/08/2018 for acute encephalopathy, acute hypoxic respiratory failure requiring intubation 12/7-12/11 and 12/17-12/23, and dysphagia s/p placement of G-tube who now presents from [facility] with severe hypoxic and hypercapnic respiratory from aspiration PNA requiring intubation on 1/28, also with questionable G-tube malposition.   Pt tolerating tube feeds well overnight. Will advance to goal rate today and increase free water flushes. Pt to discharge to SNF today. Tube feed recommendations above. Per chart, pt with 8lb weight loss since admit.   Medications reviewed and include: augmentin, aspirin, vitamin D3, plavix, lovenox, pepcid, insulin, reglan   Labs reviewed: K 3.7 wnl, P 3.0 wnl, Mg 2.0 wnl cbgs- 152, 189, 113 x 24 hrs  Diet Order:   Diet Order            Diet NPO time specified  Diet effective now             EDUCATION NEEDS:   Not  appropriate for education at this time  Skin:  Skin Assessment: Skin Integrity Issues:(MSAD to buttocks, coccyx; ecchymosis to bilateral arms)  Last BM:  09/27/2018 - large type 6  Height:   Ht Readings from Last 1 Encounters:  09/19/18 5\' 6"  (1.676 m)   Weight:   Wt Readings from Last 1 Encounters:  09/28/18 56.6 kg   Ideal Body Weight:  64.5 kg  BMI:  Body mass index is 20.14 kg/m.  Estimated Nutritional Needs:   Kcal:  1755-2025 (MSJ x 1.3-1.5)  Protein:  90-108 grams (1.5-1.8 grams/kg)  Fluid:  1.8 L/day (30 mL/kg)  Koleen Distance MS, RD, LDN Pager #- [phone] Office#- [phone] After Hours Pager: [phone]

## 2018-09-29 NOTE — Progress Notes (Signed)
RT to patient bedside for CPT. Tubefeed paused for treatment. Patient HOB greater than 30 degrees. Patient resting comfortably in bed on room air, SAT at 97%. Patient tolerated treatment well.

## 2018-09-30 DIAGNOSIS — E119 Type 2 diabetes mellitus without complications: Secondary | ICD-10-CM | POA: Diagnosis not present

## 2018-09-30 DIAGNOSIS — K219 Gastro-esophageal reflux disease without esophagitis: Secondary | ICD-10-CM | POA: Diagnosis not present

## 2018-09-30 DIAGNOSIS — I10 Essential (primary) hypertension: Secondary | ICD-10-CM | POA: Diagnosis not present

## 2018-09-30 DIAGNOSIS — J189 Pneumonia, unspecified organism: Secondary | ICD-10-CM | POA: Diagnosis not present

## 2018-09-30 DIAGNOSIS — I2510 Atherosclerotic heart disease of native coronary artery without angina pectoris: Secondary | ICD-10-CM | POA: Diagnosis not present

## 2018-09-30 DIAGNOSIS — F0390 Unspecified dementia without behavioral disturbance: Secondary | ICD-10-CM | POA: Diagnosis not present

## 2018-10-01 LAB — CULTURE, BLOOD (ROUTINE X 2)
Culture: NO GROWTH
Culture: NO GROWTH
Special Requests: ADEQUATE
Special Requests: ADEQUATE

## 2018-10-05 ENCOUNTER — Other Ambulatory Visit: Payer: Self-pay | Admitting: *Deleted

## 2018-10-05 NOTE — Patient Outreach (Signed)
[facility]) Care Management  10/05/2018  HERB BELTRE 21-Aug-1956 [id number]   Post Acute Care Coordination Call   Phone call to patient's spouse to follow up on patient's progress in rehab and status of wheelchair ramp. Per patient's spouse, patient is making progress in therapy, however progress is slow. [employer] did build patient's wheelchair ramp as well as rails going into patient's kitchen. Patient's spouse very appreciative but discussed feeling very overwhelmed with patient's care. This Education officer, museum provided spouse with emotional support, emphasizing self care.   Plan: This social worker to continue to follow patient's progress in SNF.   Sheralyn Boatman [facility] Care Management [phone]

## 2018-10-05 NOTE — Patient Outreach (Signed)
[name] Care Management  10/05/2018  [name] 07-Jun-1956 [id number]   Post Acute Care Coordination Call   Phone call to patient's spouse to follow up on his progress in therapy and referral for the wheelchair ramp through [facility]. There was no answer, voicemail message left requesting a return call.   Sheralyn Boatman [facility] Care Management [phone]

## 2018-10-06 DIAGNOSIS — L89152 Pressure ulcer of sacral region, stage 2: Secondary | ICD-10-CM | POA: Diagnosis not present

## 2018-10-07 ENCOUNTER — Emergency Department
Admission: EM | Admit: 2018-10-07 | Discharge: 2018-10-08 | Disposition: A | Discharge status: Home / Self Care | Payer: PPO | Attending: Emergency Medicine | Admitting: Emergency Medicine

## 2018-10-07 ENCOUNTER — Emergency Department: Payer: PPO

## 2018-10-07 ENCOUNTER — Encounter: Payer: Self-pay | Admitting: Emergency Medicine

## 2018-10-07 DIAGNOSIS — Z7902 Long term (current) use of antithrombotics/antiplatelets: Secondary | ICD-10-CM | POA: Diagnosis not present

## 2018-10-07 DIAGNOSIS — I509 Heart failure, unspecified: Secondary | ICD-10-CM | POA: Insufficient documentation

## 2018-10-07 DIAGNOSIS — G309 Alzheimer's disease, unspecified: Secondary | ICD-10-CM | POA: Diagnosis not present

## 2018-10-07 DIAGNOSIS — Z794 Long term (current) use of insulin: Secondary | ICD-10-CM | POA: Insufficient documentation

## 2018-10-07 DIAGNOSIS — E860 Dehydration: Secondary | ICD-10-CM | POA: Insufficient documentation

## 2018-10-07 DIAGNOSIS — Z87891 Personal history of nicotine dependence: Secondary | ICD-10-CM | POA: Insufficient documentation

## 2018-10-07 DIAGNOSIS — I2510 Atherosclerotic heart disease of native coronary artery without angina pectoris: Secondary | ICD-10-CM | POA: Diagnosis not present

## 2018-10-07 DIAGNOSIS — R0602 Shortness of breath: Secondary | ICD-10-CM

## 2018-10-07 DIAGNOSIS — I130 Hypertensive heart and chronic kidney disease with heart failure and stage 1 through stage 4 chronic kidney disease, or unspecified chronic kidney disease: Secondary | ICD-10-CM | POA: Insufficient documentation

## 2018-10-07 DIAGNOSIS — Z931 Gastrostomy status: Secondary | ICD-10-CM | POA: Diagnosis not present

## 2018-10-07 DIAGNOSIS — R000 Tachycardia, unspecified: Secondary | ICD-10-CM | POA: Diagnosis not present

## 2018-10-07 DIAGNOSIS — N189 Chronic kidney disease, unspecified: Secondary | ICD-10-CM | POA: Diagnosis not present

## 2018-10-07 DIAGNOSIS — B370 Candidal stomatitis: Secondary | ICD-10-CM | POA: Insufficient documentation

## 2018-10-07 DIAGNOSIS — B379 Candidiasis, unspecified: Secondary | ICD-10-CM | POA: Diagnosis not present

## 2018-10-07 DIAGNOSIS — F0280 Dementia in other diseases classified elsewhere without behavioral disturbance: Secondary | ICD-10-CM | POA: Diagnosis not present

## 2018-10-07 DIAGNOSIS — E1122 Type 2 diabetes mellitus with diabetic chronic kidney disease: Secondary | ICD-10-CM | POA: Diagnosis not present

## 2018-10-07 LAB — CBC WITH DIFFERENTIAL/PLATELET
Abs Immature Granulocytes: 0.05 10*3/uL (ref 0.00–0.07)
Basophils Absolute: 0.1 10*3/uL (ref 0.0–0.1)
Basophils Relative: 0 %
EOS PCT: 0 %
Eosinophils Absolute: 0.1 10*3/uL (ref 0.0–0.5)
HCT: 41.3 % (ref 39.0–52.0)
Hemoglobin: 13.7 g/dL (ref 13.0–17.0)
Immature Granulocytes: 0 %
Lymphocytes Relative: 12 %
Lymphs Abs: 1.8 10*3/uL (ref 0.7–4.0)
MCH: 30.4 pg (ref 26.0–34.0)
MCHC: 33.2 g/dL (ref 30.0–36.0)
MCV: 91.6 fL (ref 80.0–100.0)
Monocytes Absolute: 1 10*3/uL (ref 0.1–1.0)
Monocytes Relative: 7 %
Neutro Abs: 11.5 10*3/uL — ABNORMAL HIGH (ref 1.7–7.7)
Neutrophils Relative %: 81 %
Platelets: 374 10*3/uL (ref 150–400)
RBC: 4.51 MIL/uL (ref 4.22–5.81)
RDW: 15.5 % (ref 11.5–15.5)
WBC: 14.5 10*3/uL — ABNORMAL HIGH (ref 4.0–10.5)
nRBC: 0 % (ref 0.0–0.2)

## 2018-10-07 LAB — COMPREHENSIVE METABOLIC PANEL
ALT: 13 U/L (ref 0–44)
AST: 14 U/L — ABNORMAL LOW (ref 15–41)
Albumin: 3.4 g/dL — ABNORMAL LOW (ref 3.5–5.0)
Alkaline Phosphatase: 135 U/L — ABNORMAL HIGH (ref 38–126)
Anion gap: 10 (ref 5–15)
BUN: 13 mg/dL (ref 8–23)
CO2: 25 mmol/L (ref 22–32)
Calcium: 8.9 mg/dL (ref 8.9–10.3)
Chloride: 101 mmol/L (ref 98–111)
Creatinine, Ser: 0.56 mg/dL — ABNORMAL LOW (ref 0.61–1.24)
GFR calc non Af Amer: >60 mL/min (ref >60)
Glucose, Bld: 292 mg/dL — ABNORMAL HIGH (ref 70–99)
Potassium: 4.3 mmol/L (ref 3.5–5.1)
Sodium: 136 mmol/L (ref 135–145)
Total Bilirubin: 0.9 mg/dL (ref 0.3–1.2)
Total Protein: 7.3 g/dL (ref 6.5–8.1)

## 2018-10-07 LAB — LACTIC ACID, PLASMA: Lactic Acid, Venous: 1.9 mmol/L (ref 0.5–1.9)

## 2018-10-07 MED ORDER — SODIUM CHLORIDE 0.9 % IV BOLUS
1000.0000 mL | Freq: Once | INTRAVENOUS | Status: AC
  Administered 2018-10-07: 1000 mL via INTRAVENOUS

## 2018-10-07 NOTE — ED Notes (Signed)
Report received, assumed care of patient.

## 2018-10-07 NOTE — ED Provider Notes (Signed)
[facility] Emergency Department Provider Note ____________________________________________   I have reviewed the triage vital signs and the nursing notes.   HISTORY  Chief Complaint Shortness of Breath   History limited by and level 5 caveat due to: Decreased verbal responsiveness, history obtained from family.    HPI Maurice Patterson is a 63 y.o. male who presents to the emergency department today accompanied by family from [facility] because of concerns for dehydration and shortness of breath.  Patient has somewhat recently gone to [facility].  Family has concerns that he is becoming dehydrated.  They have noticed some increased weakness recently.  Additionally apparently staff had concerns that his throat was closing although family states they have noticed some findings concerning for thrush do not feel like his throat was closing.  Additionally family states that he recently had some kind of electric therapy to his face to help with his dysphasia and since then they have noticed a rash on his face.  Have not noticed any fevers.  Family would like to get him back to [facility]  Per medical record review patient has a history of MI, stroke, aspiration pneumonia.   Past Medical History:  Diagnosis Date  . Acute MI ([place])   . Arthritis   . Back pain   . CAD (coronary artery disease)   . CHF (congestive heart failure) ([place])   . Chronic kidney disease   . Degenerative lumbar disc   . Dementia ([place])   . Diabetes mellitus without complication ([place])   . GERD (gastroesophageal reflux disease)   . Hernia of abdominal cavity   . Hyperlipemia   . Hypertension   . Malignant intraductal papillary mucinous tumor of pancreas ([place])   . Memory loss   . Pancreatitis   . Seizures ([place])    staring spells  . Shingles   . Stroke ([place]) 08/2017  . TIA (transient ischemic attack)     Patient Active Problem List   Diagnosis Date Noted  . Protein-calorie  malnutrition, severe 09/20/2018  . Diabetes ([place]) 09/19/2018  . Sepsis ([place]) 09/19/2018  . Pressure injury of skin 08/12/2018  . Aspiration pneumonia ([place])   . Palliative care encounter   . Dysphagia   . Acute respiratory failure with hypoxia ([place])   . Fever   . CAP (community acquired pneumonia) 07/22/2018  . GERD (gastroesophageal reflux disease) 07/22/2018  . Hypersalivation 01/30/2018  . Drooling 01/30/2018  . Alzheimer's dementia ([place]) 05/01/2017  . Cognitive decline 10/20/2016  . Chest pain, rule out acute myocardial infarction 04/05/2016  . Confusion 10/18/2015  . Constipation 08/23/2015  . Uncontrolled type 2 diabetes mellitus with hyperglycemia, with long-term current use of insulin ([place]) 08/23/2015  . Essential hypertension 08/23/2015  . Right lower lobe pneumonia ([place]) 08/23/2015  . Abdominal pain 08/21/2015  . Altered mental status 02/19/2015    Past Surgical History:  Procedure Laterality Date  . CARDIAC CATHETERIZATION    . CORONARY ANGIOPLASTY    . CYSTOSCOPY WITH STENT PLACEMENT Right 05/13/2017   Procedure: CYSTOSCOPY WITH STENT PLACEMENT;  Surgeon: Nickie Retort, MD;  Location: [facility] ORS;  Service: Urology;  Laterality: Right;  . ERCP N/A 09/12/2015   Procedure: ENDOSCOPIC RETROGRADE CHOLANGIOPANCREATOGRAPHY (ERCP);  Surgeon: Hulen Luster, MD;  Location: [facility] ENDOSCOPY;  Service: Gastroenterology;  Laterality: N/A;  . ERCP N/A 01/02/2016   Procedure: ENDOSCOPIC RETROGRADE CHOLANGIOPANCREATOGRAPHY (ERCP);  Surgeon: Hulen Luster, MD;  Location: [facility] ENDOSCOPY;  Service: Gastroenterology;  Laterality: N/A;  .  EXTERNAL FIXATION WRIST FRACTURE Left   . left arm metal plate    . LEFT HEART CATH AND CORONARY ANGIOGRAPHY Left 11/04/2016   Procedure: Left Heart Cath and Coronary Angiography;  Surgeon: Isaias Cowman, MD;  Location: [facility] CV LAB;  Service: Cardiovascular;  Laterality: Left;  . Left shoulder surgery    . PEG PLACEMENT Left 08/28/2018   Procedure:  PERCUTANEOUS ENDOSCOPIC GASTROSTOMY (PEG) PLACEMENT;  Surgeon: Lin Landsman, MD;  Location: [facility] ENDOSCOPY;  Service: Gastroenterology;  Laterality: Left;  . Stent times 3  2013   Cardiac  . TOTAL ELBOW REPLACEMENT Left   . VASECTOMY    . VENTRICULOPERITONEAL SHUNT      Prior to Admission medications   Medication Sig Start Date End Date Taking? Authorizing Provider  acetaminophen (TYLENOL) 500 MG tablet Place 1 tablet (500 mg total) into feeding tube every 6 (six) hours as needed for mild pain. 09/06/18   Hillary Bow, MD  albuterol (PROVENTIL) (2.5 MG/3ML) 0.083% nebulizer solution Take 3 mLs (2.5 mg total) by nebulization every 3 (three) hours as needed for wheezing or shortness of breath. 09/06/18   Hillary Bow, MD  aspirin 81 MG chewable tablet Place 1 tablet (81 mg total) into feeding tube daily. 09/07/18   Hillary Bow, MD  cholecalciferol (D-VI-SOL) 10 MCG/ML LIQD Place 2.5 mLs (1,000 Units total) into feeding tube daily. 09/07/18   Hillary Bow, MD  clopidogrel (PLAVIX) 75 MG tablet Place 1 tablet (75 mg total) into feeding tube daily. 09/07/18   Hillary Bow, MD  donepezil (ARICEPT) 10 MG tablet Place 1 tablet (10 mg total) into feeding tube 2 (two) times daily. 09/06/18   Hillary Bow, MD  insulin aspart (NOVOLOG) 100 UNIT/ML injection Inject 3 Units into the skin every 6 (six) hours. 09/06/18   Hillary Bow, MD  Insulin Glargine (LANTUS SOLOSTAR) 100 UNIT/ML Solostar Pen Inject 4 Units into the skin at bedtime. 09/29/18   Fritzi Mandes, MD  memantine (NAMENDA) 10 MG tablet Place 1 tablet (10 mg total) into feeding tube 2 (two) times daily. 09/06/18   Hillary Bow, MD  metoCLOPramide (REGLAN) 5 MG tablet Place 1 tablet (5 mg total) into feeding tube 4 (four) times daily -  before meals and at bedtime. 09/29/18   Fritzi Mandes, MD  metoprolol tartrate (LOPRESSOR) 25 MG tablet Place 1 tablet (25 mg total) into feeding tube 2 (two) times daily. 09/06/18   Hillary Bow, MD   Nutritional Supplements (FEEDING SUPPLEMENT, GLUCERNA 1.5 CAL,) LIQD Place 1,000 mLs into feeding tube continuous. 09/06/18   Hillary Bow, MD  pantoprazole sodium (PROTONIX) 40 mg/20 mL PACK Place 20 mLs (40 mg total) into feeding tube daily. 09/07/18   Hillary Bow, MD  potassium chloride (KLOR-CON) 20 MEQ packet Place 20 mEq into feeding tube daily. 09/06/18   Hillary Bow, MD  Water For Irrigation, Sterile (FREE WATER) SOLN Place 180 mLs into feeding tube every 4 (four) hours. 09/06/18   Hillary Bow, MD    Allergies Hydrocodone; Morphine; Ambien [zolpidem]; Brilinta [ticagrelor]; Flexeril [cyclobenzaprine]; Flunitrazepam; Haldol [haloperidol lactate]; Levetiracetam; Lorazepam; Risperdal [risperidone]; Trazodone; Benadryl [diphenhydramine hcl (sleep)]; Penicillins; and Vancomycin  Family History  Problem Relation Age of Onset  . Diabetes Mother   . Hypertension Mother   . CAD Mother   . Hyperlipidemia Mother   . Stroke Mother   . ALS Mother   . Alzheimer's disease Father   . Diabetes Father   . Heart Problems Brother     Social History Social History  Tobacco Use  . Smoking status: Former Smoker    Last attempt to quit: 2013    Years since quitting: 7.1  . Smokeless tobacco: Never Used  . Tobacco comment: used to smoke 2PD for 40 yrs, quit about 4 years ago  Substance Use Topics  . Alcohol use: No    Alcohol/week: 0.0 standard drinks    Comment: occasional  . Drug use: No    Review of Systems Unable to obtain secondary to medical condition ____________________________________________   PHYSICAL EXAM:  VITAL SIGNS: ED Triage Vitals  Enc Vitals Group     BP 10/07/18 2100 (!) 135/96     Pulse Rate 10/07/18 2100 (!) 110     Resp 10/07/18 2130 (!) 28     Temp --      Temp src --      SpO2 10/07/18 2100 94 %   Constitutional: Awake and alert. Essentially non verbal.  Eyes: Conjunctivae are normal.  ENT      Head: Normocephalic. Rash noted to area of  facial hair.       Nose: No congestion/rhinnorhea.      Mouth/Throat: Patchy lesions on tongue       Neck: No stridor. Hematological/Lymphatic/Immunilogical: No cervical lymphadenopathy. Cardiovascular: Tachycardic, regular rhythm.  No murmurs, rubs, or gallops.  Respiratory: Normal respiratory effort without tachypnea nor retractions. Breath sounds are clear and equal bilaterally. No wheezes/rales/rhonchi. Gastrointestinal: Soft and non tender. PEG tube in place.  Genitourinary: Deferred Musculoskeletal: Normal range of motion in all extremities. No lower extremity edema. Neurologic:  Essentially non verbal. Awake and alert.  Skin:  Skin is warm, dry and intact.  ____________________________________________    LABS (pertinent positives/negatives)  CBC wbc 14.5, hgb 13.7, plt 374 CMP na 136, k 4.3, glu 292, cr 0.56  ____________________________________________   EKG  I, Nance Pear, attending physician, personally viewed and interpreted this EKG  EKG Time: 2102 Rate: 110 Rhythm: sinus tachycardia Axis: normal Intervals: qtc 470 QRS: narrow ST changes: no st elevation Impression: abnormal ekg   ____________________________________________    RADIOLOGY  CXR No acute findings  ____________________________________________   PROCEDURES  Procedures  ____________________________________________   INITIAL IMPRESSION / ASSESSMENT AND PLAN / ED COURSE  Pertinent labs & imaging results that were available during my care of the patient were reviewed by me and considered in my medical decision making (see chart for details).   Patient brought in by family because of concerns for some dehydration.  Patient is coming from living facility.  Apparently they also had some concerns for shortness of breath however patient is not hypoxic here.  He does appear dehydrated with slightly elevated heart rate.  He did have some dry mucous membranes.  Did have some concerns for  thrush in the throat.  This family would like patient to be discharged back to [facility] as they are happy with the PT care there.  Patient's did not have elevated lactic acidosis.  Chest x-ray without concerning findings.  Will give patient IV fluids here.  Will also check urine.   ____________________________________________   FINAL CLINICAL IMPRESSION(S) / ED DIAGNOSES  Final diagnoses:  Dehydration  Thrush     Note: This dictation was prepared with Dragon dictation. Any transcriptional errors that result from this process are unintentional     Nance Pear, MD 10/08/18 [phone]

## 2018-10-07 NOTE — ED Notes (Signed)
Pt has very dry tongue and throat. Pt is mouth breathing. Had electrical stimulation to his throat (for his dysphagia?) and now has rash on face which family believes is from the electrodes. Family is very upset that he had this procedure. Also per family pta the pt was diaphoretic. bs was 320. Pt is not diaphoretic here. Pt gets anxious when other than family approach him.

## 2018-10-07 NOTE — ED Triage Notes (Signed)
Pt family sob and diaphorectic today. Had "electroshock therapy" thurs.

## 2018-10-08 MED ORDER — AMOXICILLIN-POT CLAVULANATE 875-125 MG PO TABS: 1.0000 | ORAL_TABLET | Freq: Two times a day (BID) | ORAL | 0 refills | Status: DC

## 2018-10-08 MED ORDER — SODIUM CHLORIDE 0.9% IV SOLUTION: 500.0000 mL | Freq: Every day | INTRAVENOUS | 0 refills | Status: DC

## 2018-10-08 MED ORDER — AMOXICILLIN-POT CLAVULANATE 875-125 MG PO TABS: 1.0000 | ORAL_TABLET | Freq: Two times a day (BID) | ORAL | 0 refills | Status: AC

## 2018-10-08 MED ORDER — NYSTATIN 100000 UNIT/ML MT SUSP: 5.0000 mL | Freq: Four times a day (QID) | OROMUCOSAL | 0 refills | Status: DC

## 2018-10-08 NOTE — Discharge Instructions (Signed)
Please seek medical attention for any high fevers, chest pain, shortness of breath, change in behavior, persistent vomiting, bloody stool or any other new or concerning symptoms.  

## 2018-10-09 DIAGNOSIS — I10 Essential (primary) hypertension: Secondary | ICD-10-CM | POA: Diagnosis not present

## 2018-10-09 DIAGNOSIS — J189 Pneumonia, unspecified organism: Secondary | ICD-10-CM | POA: Diagnosis not present

## 2018-10-09 DIAGNOSIS — E119 Type 2 diabetes mellitus without complications: Secondary | ICD-10-CM | POA: Diagnosis not present

## 2018-10-09 DIAGNOSIS — I2510 Atherosclerotic heart disease of native coronary artery without angina pectoris: Secondary | ICD-10-CM | POA: Diagnosis not present

## 2018-10-09 DIAGNOSIS — F0390 Unspecified dementia without behavioral disturbance: Secondary | ICD-10-CM | POA: Diagnosis not present

## 2018-10-09 DIAGNOSIS — B379 Candidiasis, unspecified: Secondary | ICD-10-CM | POA: Diagnosis not present

## 2018-10-09 DIAGNOSIS — K219 Gastro-esophageal reflux disease without esophagitis: Secondary | ICD-10-CM | POA: Diagnosis not present

## 2018-10-09 DIAGNOSIS — R066 Hiccough: Secondary | ICD-10-CM | POA: Diagnosis not present

## 2018-10-10 ENCOUNTER — Other Ambulatory Visit: Payer: Self-pay | Admitting: *Deleted

## 2018-10-10 NOTE — Patient Outreach (Signed)
[facility]) Care Management  10/10/2018  Maurice Patterson [birth year] [id number]   Facility site visit to [place]. Collaboration with IDT and [facility] UM  concerning patient's progress, discharge plan and potential care management needs. PT stating patient not making significant progress. Patient had an ED visit last week for SOB and thrush.   Continues with tube feeds as ordered.  Made IDT aware patient was active with [facility] CM and SW had referred spouse to a community resource that was able to build patient a ramp and install rails at patient's home.  Planned discharge date is not set and discharge disposition is home with spouse.  Patient was evaluated for community based chronic disease management services with [facility] care Management Program as a benefit of patient's [employer].    Went to patient's room, patient in bed with HOB elevated.  Attempted to converse with patient but patient not following or attempting to communicate.  Patient awake with eyes open but unable to elicit any speech.  No family present at bedside.   Plan to collaborate with [facility] LCSW about patient's ongoing progress.   For questions please contact:   Theordore Cisnero RN, [facility] Liaison  [phone]) [employer] [phone]) Toll free office

## 2018-10-12 LAB — CULTURE, BLOOD (ROUTINE X 2): Culture: NO GROWTH

## 2018-10-13 DIAGNOSIS — L89152 Pressure ulcer of sacral region, stage 2: Secondary | ICD-10-CM | POA: Diagnosis not present

## 2018-10-14 DIAGNOSIS — I2510 Atherosclerotic heart disease of native coronary artery without angina pectoris: Secondary | ICD-10-CM | POA: Diagnosis not present

## 2018-10-14 DIAGNOSIS — F0390 Unspecified dementia without behavioral disturbance: Secondary | ICD-10-CM | POA: Diagnosis not present

## 2018-10-14 DIAGNOSIS — I10 Essential (primary) hypertension: Secondary | ICD-10-CM | POA: Diagnosis not present

## 2018-10-14 DIAGNOSIS — K219 Gastro-esophageal reflux disease without esophagitis: Secondary | ICD-10-CM | POA: Diagnosis not present

## 2018-10-14 DIAGNOSIS — J9600 Acute respiratory failure, unspecified whether with hypoxia or hypercapnia: Secondary | ICD-10-CM | POA: Diagnosis not present

## 2018-10-14 DIAGNOSIS — E119 Type 2 diabetes mellitus without complications: Secondary | ICD-10-CM | POA: Diagnosis not present

## 2018-10-17 ENCOUNTER — Other Ambulatory Visit: Payer: Self-pay | Admitting: *Deleted

## 2018-10-17 NOTE — Patient Outreach (Signed)
[facility] [place] [facility]) Care Management  10/17/2018  GEORG ANG 16-Oct-1955 [id number]   Facility site visit to [place]. Collaboration with IDT and [facility] UM RN concerning patient's progress, discharge plan and potential care management needs. PT states patient is continuing to receive daily PT and ST. Patient is max assist with all ADLs and transfers. Unable to assist patient with ambulation related to cognitive barriers.  PT director read ST notes which stated patient was able to participate during the last session and it had actully been the best session since admission.  During IDT meeting PT director returned a call to Cecille Rubin patient's spouse.  Spouse voiced concerns over patient not receiving therapy as much as supposed to, patient not getting up in chair as much as he could be, patient losing weight, patient's feeding schedule and patient's current weakened status.  PT director answered questions related to therapy and directed spouse to contact patient's nursing team to assist with other questions.  Spouse stated during the phone call the patient's sister plans to be the patient's care-giver once home and spouse is at work. Spouse also stated the patient's children will assist too.  PT made spouse aware patient has made very little gains in therapy but has been upgraded to a high back wheelchair to assist with sitting up.  Spouse stated she was stressed and felt upset by the lack of progress the patient is making.  Spouse did thank the PT staff for answering her questions and listening to her concerns.  Went to patient's room and noted patient was sitting up in high back wheelchair. Hoyer lift pad under patient.  Attempted to converse with patient by stating his name but patient did not interact or acknowledge presence with eye movements.   [facility] SW still following up with spouse while patient is in the SNF, will update her on patient's status.     Rutherford Limerick RN, BSN [place] Acute Care Coordinator [phone]) [employer] [phone]) Toll free office

## 2018-10-19 ENCOUNTER — Other Ambulatory Visit: Payer: Self-pay | Admitting: *Deleted

## 2018-10-19 NOTE — Patient Outreach (Signed)
[facility]) Care Management  10/19/2018  NICHOLE KELTNER 1956-02-08 [id number]   Post Acute care Coordination Call  Return phone call from patient's spouse, stating that she continues to have concerns about progress made while in rehab and patient's feeding schedule. Per patient's spouse, she feels he is losing weight. Per patient's spouse, she has spoke with nursing leadership at the facility who have answered  her questions but yet she is still frustrated. Per patient's spouse, there is no discharge date yet, however patient will discharge with [facility] and possibly support from a in home care agency to provide non-medical in home care. This Education officer, museum allowed patient's spouse vent her frustrations and concerns and provided emotional support. This social worker explained the change in role, however stated that she could call this social worker if she had any questions regarding in home care needs.    Plan: This Education officer, museum will follow up with [place] Coordinator Janci Minor.    Sheralyn Boatman [facility] Care Management [phone]

## 2018-10-19 NOTE — Patient Outreach (Signed)
[facility]) Care Management  10/19/2018  Maurice Patterson [birth year] [id number]   Phone call to patient's spouse to follow up on patient's progress in rehab and to assess for any additional community resource needs. Voicemail message left requesting a return call.   Sheralyn Boatman [facility] Care Management [phone]

## 2018-10-20 DIAGNOSIS — L89152 Pressure ulcer of sacral region, stage 2: Secondary | ICD-10-CM | POA: Diagnosis not present

## 2018-10-24 ENCOUNTER — Other Ambulatory Visit: Payer: Self-pay

## 2018-10-24 ENCOUNTER — Inpatient Hospital Stay
Admission: EM | Admit: 2018-10-24 | Discharge: 2018-10-24 | DRG: 871 | Disposition: A | Discharge status: Other Acute Inpatient Hospital | Payer: PPO | Source: Skilled Nursing Facility | Attending: Internal Medicine | Admitting: Internal Medicine

## 2018-10-24 ENCOUNTER — Emergency Department: Payer: PPO

## 2018-10-24 ENCOUNTER — Encounter: Payer: Self-pay | Admitting: Emergency Medicine

## 2018-10-24 DIAGNOSIS — Z955 Presence of coronary angioplasty implant and graft: Secondary | ICD-10-CM | POA: Diagnosis not present

## 2018-10-24 DIAGNOSIS — D649 Anemia, unspecified: Secondary | ICD-10-CM | POA: Diagnosis not present

## 2018-10-24 DIAGNOSIS — R627 Adult failure to thrive: Secondary | ICD-10-CM | POA: Diagnosis not present

## 2018-10-24 DIAGNOSIS — R4020 Unspecified coma: Secondary | ICD-10-CM | POA: Diagnosis not present

## 2018-10-24 DIAGNOSIS — K869 Disease of pancreas, unspecified: Secondary | ICD-10-CM | POA: Diagnosis not present

## 2018-10-24 DIAGNOSIS — Z79899 Other long term (current) drug therapy: Secondary | ICD-10-CM

## 2018-10-24 DIAGNOSIS — Z9911 Dependence on respirator [ventilator] status: Secondary | ICD-10-CM | POA: Diagnosis not present

## 2018-10-24 DIAGNOSIS — J181 Lobar pneumonia, unspecified organism: Secondary | ICD-10-CM | POA: Diagnosis not present

## 2018-10-24 DIAGNOSIS — E1140 Type 2 diabetes mellitus with diabetic neuropathy, unspecified: Secondary | ICD-10-CM | POA: Diagnosis not present

## 2018-10-24 DIAGNOSIS — G309 Alzheimer's disease, unspecified: Secondary | ICD-10-CM | POA: Diagnosis not present

## 2018-10-24 DIAGNOSIS — Z823 Family history of stroke: Secondary | ICD-10-CM

## 2018-10-24 DIAGNOSIS — F0390 Unspecified dementia without behavioral disturbance: Secondary | ICD-10-CM | POA: Diagnosis not present

## 2018-10-24 DIAGNOSIS — J189 Pneumonia, unspecified organism: Secondary | ICD-10-CM

## 2018-10-24 DIAGNOSIS — J9601 Acute respiratory failure with hypoxia: Secondary | ICD-10-CM | POA: Diagnosis not present

## 2018-10-24 DIAGNOSIS — J984 Other disorders of lung: Secondary | ICD-10-CM | POA: Diagnosis not present

## 2018-10-24 DIAGNOSIS — E8809 Other disorders of plasma-protein metabolism, not elsewhere classified: Secondary | ICD-10-CM | POA: Diagnosis not present

## 2018-10-24 DIAGNOSIS — F0280 Dementia in other diseases classified elsewhere without behavioral disturbance: Secondary | ICD-10-CM | POA: Diagnosis not present

## 2018-10-24 DIAGNOSIS — G4709 Other insomnia: Secondary | ICD-10-CM | POA: Diagnosis not present

## 2018-10-24 DIAGNOSIS — Z681 Body mass index (BMI) 19 or less, adult: Secondary | ICD-10-CM

## 2018-10-24 DIAGNOSIS — N189 Chronic kidney disease, unspecified: Secondary | ICD-10-CM | POA: Diagnosis not present

## 2018-10-24 DIAGNOSIS — I959 Hypotension, unspecified: Secondary | ICD-10-CM | POA: Diagnosis not present

## 2018-10-24 DIAGNOSIS — I9589 Other hypotension: Secondary | ICD-10-CM | POA: Diagnosis not present

## 2018-10-24 DIAGNOSIS — Z452 Encounter for adjustment and management of vascular access device: Secondary | ICD-10-CM | POA: Diagnosis not present

## 2018-10-24 DIAGNOSIS — K861 Other chronic pancreatitis: Secondary | ICD-10-CM | POA: Diagnosis present

## 2018-10-24 DIAGNOSIS — J9621 Acute and chronic respiratory failure with hypoxia: Secondary | ICD-10-CM | POA: Diagnosis not present

## 2018-10-24 DIAGNOSIS — Z931 Gastrostomy status: Secondary | ICD-10-CM | POA: Diagnosis not present

## 2018-10-24 DIAGNOSIS — I130 Hypertensive heart and chronic kidney disease with heart failure and stage 1 through stage 4 chronic kidney disease, or unspecified chronic kidney disease: Secondary | ICD-10-CM | POA: Diagnosis present

## 2018-10-24 DIAGNOSIS — G40909 Epilepsy, unspecified, not intractable, without status epilepticus: Secondary | ICD-10-CM | POA: Diagnosis not present

## 2018-10-24 DIAGNOSIS — Z7902 Long term (current) use of antithrombotics/antiplatelets: Secondary | ICD-10-CM

## 2018-10-24 DIAGNOSIS — Z820 Family history of epilepsy and other diseases of the nervous system: Secondary | ICD-10-CM

## 2018-10-24 DIAGNOSIS — K219 Gastro-esophageal reflux disease without esophagitis: Secondary | ICD-10-CM | POA: Diagnosis not present

## 2018-10-24 DIAGNOSIS — I629 Nontraumatic intracranial hemorrhage, unspecified: Secondary | ICD-10-CM | POA: Diagnosis not present

## 2018-10-24 DIAGNOSIS — K9423 Gastrostomy malfunction: Secondary | ICD-10-CM | POA: Diagnosis not present

## 2018-10-24 DIAGNOSIS — K6389 Other specified diseases of intestine: Secondary | ICD-10-CM | POA: Diagnosis not present

## 2018-10-24 DIAGNOSIS — Z7982 Long term (current) use of aspirin: Secondary | ICD-10-CM

## 2018-10-24 DIAGNOSIS — I611 Nontraumatic intracerebral hemorrhage in hemisphere, cortical: Secondary | ICD-10-CM | POA: Diagnosis not present

## 2018-10-24 DIAGNOSIS — Z4682 Encounter for fitting and adjustment of non-vascular catheter: Secondary | ICD-10-CM | POA: Diagnosis not present

## 2018-10-24 DIAGNOSIS — G3109 Other frontotemporal dementia: Secondary | ICD-10-CM | POA: Diagnosis not present

## 2018-10-24 DIAGNOSIS — K922 Gastrointestinal hemorrhage, unspecified: Secondary | ICD-10-CM | POA: Diagnosis not present

## 2018-10-24 DIAGNOSIS — I252 Old myocardial infarction: Secondary | ICD-10-CM

## 2018-10-24 DIAGNOSIS — R2689 Other abnormalities of gait and mobility: Secondary | ICD-10-CM | POA: Diagnosis not present

## 2018-10-24 DIAGNOSIS — E1142 Type 2 diabetes mellitus with diabetic polyneuropathy: Secondary | ICD-10-CM | POA: Diagnosis not present

## 2018-10-24 DIAGNOSIS — R310 Gross hematuria: Secondary | ICD-10-CM | POA: Diagnosis not present

## 2018-10-24 DIAGNOSIS — Z881 Allergy status to other antibiotic agents status: Secondary | ICD-10-CM

## 2018-10-24 DIAGNOSIS — S2242XA Multiple fractures of ribs, left side, initial encounter for closed fracture: Secondary | ICD-10-CM | POA: Diagnosis not present

## 2018-10-24 DIAGNOSIS — E119 Type 2 diabetes mellitus without complications: Secondary | ICD-10-CM | POA: Diagnosis not present

## 2018-10-24 DIAGNOSIS — J90 Pleural effusion, not elsewhere classified: Secondary | ICD-10-CM | POA: Diagnosis not present

## 2018-10-24 DIAGNOSIS — I639 Cerebral infarction, unspecified: Secondary | ICD-10-CM | POA: Diagnosis not present

## 2018-10-24 DIAGNOSIS — Z7401 Bed confinement status: Secondary | ICD-10-CM | POA: Diagnosis not present

## 2018-10-24 DIAGNOSIS — E7849 Other hyperlipidemia: Secondary | ICD-10-CM | POA: Diagnosis not present

## 2018-10-24 DIAGNOSIS — I219 Acute myocardial infarction, unspecified: Secondary | ICD-10-CM | POA: Diagnosis not present

## 2018-10-24 DIAGNOSIS — Z880 Allergy status to penicillin: Secondary | ICD-10-CM

## 2018-10-24 DIAGNOSIS — G4089 Other seizures: Secondary | ICD-10-CM | POA: Diagnosis not present

## 2018-10-24 DIAGNOSIS — A419 Sepsis, unspecified organism: Principal | ICD-10-CM | POA: Diagnosis present

## 2018-10-24 DIAGNOSIS — R64 Cachexia: Secondary | ICD-10-CM | POA: Diagnosis not present

## 2018-10-24 DIAGNOSIS — I509 Heart failure, unspecified: Secondary | ICD-10-CM | POA: Diagnosis present

## 2018-10-24 DIAGNOSIS — G6289 Other specified polyneuropathies: Secondary | ICD-10-CM | POA: Diagnosis not present

## 2018-10-24 DIAGNOSIS — Z9181 History of falling: Secondary | ICD-10-CM | POA: Diagnosis not present

## 2018-10-24 DIAGNOSIS — S06356A Traumatic hemorrhage of left cerebrum with loss of consciousness greater than 24 hours without return to pre-existing conscious level with patient surviving, initial encounter: Secondary | ICD-10-CM | POA: Diagnosis not present

## 2018-10-24 DIAGNOSIS — K859 Acute pancreatitis without necrosis or infection, unspecified: Secondary | ICD-10-CM | POA: Diagnosis not present

## 2018-10-24 DIAGNOSIS — E861 Hypovolemia: Secondary | ICD-10-CM | POA: Diagnosis not present

## 2018-10-24 DIAGNOSIS — J9811 Atelectasis: Secondary | ICD-10-CM | POA: Diagnosis not present

## 2018-10-24 DIAGNOSIS — J690 Pneumonitis due to inhalation of food and vomit: Secondary | ICD-10-CM | POA: Diagnosis present

## 2018-10-24 DIAGNOSIS — K5900 Constipation, unspecified: Secondary | ICD-10-CM | POA: Diagnosis present

## 2018-10-24 DIAGNOSIS — E1122 Type 2 diabetes mellitus with diabetic chronic kidney disease: Secondary | ICD-10-CM | POA: Diagnosis not present

## 2018-10-24 DIAGNOSIS — L89154 Pressure ulcer of sacral region, stage 4: Secondary | ICD-10-CM | POA: Diagnosis not present

## 2018-10-24 DIAGNOSIS — J9620 Acute and chronic respiratory failure, unspecified whether with hypoxia or hypercapnia: Secondary | ICD-10-CM | POA: Diagnosis not present

## 2018-10-24 DIAGNOSIS — E1165 Type 2 diabetes mellitus with hyperglycemia: Secondary | ICD-10-CM | POA: Diagnosis present

## 2018-10-24 DIAGNOSIS — M6281 Muscle weakness (generalized): Secondary | ICD-10-CM | POA: Diagnosis not present

## 2018-10-24 DIAGNOSIS — S06366D Traumatic hemorrhage of cerebrum, unspecified, with loss of consciousness greater than 24 hours without return to pre-existing conscious level with patient surviving, subsequent encounter: Secondary | ICD-10-CM | POA: Diagnosis not present

## 2018-10-24 DIAGNOSIS — G308 Other Alzheimer's disease: Secondary | ICD-10-CM | POA: Diagnosis not present

## 2018-10-24 DIAGNOSIS — R4182 Altered mental status, unspecified: Secondary | ICD-10-CM | POA: Diagnosis not present

## 2018-10-24 DIAGNOSIS — E871 Hypo-osmolality and hyponatremia: Secondary | ICD-10-CM | POA: Diagnosis not present

## 2018-10-24 DIAGNOSIS — Z8701 Personal history of pneumonia (recurrent): Secondary | ICD-10-CM

## 2018-10-24 DIAGNOSIS — I619 Nontraumatic intracerebral hemorrhage, unspecified: Secondary | ICD-10-CM | POA: Diagnosis not present

## 2018-10-24 DIAGNOSIS — N3289 Other specified disorders of bladder: Secondary | ICD-10-CM | POA: Diagnosis not present

## 2018-10-24 DIAGNOSIS — I10 Essential (primary) hypertension: Secondary | ICD-10-CM | POA: Diagnosis not present

## 2018-10-24 DIAGNOSIS — I2510 Atherosclerotic heart disease of native coronary artery without angina pectoris: Secondary | ICD-10-CM | POA: Diagnosis present

## 2018-10-24 DIAGNOSIS — R5383 Other fatigue: Secondary | ICD-10-CM | POA: Diagnosis not present

## 2018-10-24 DIAGNOSIS — Z888 Allergy status to other drugs, medicaments and biological substances status: Secondary | ICD-10-CM

## 2018-10-24 DIAGNOSIS — I69328 Other speech and language deficits following cerebral infarction: Secondary | ICD-10-CM

## 2018-10-24 DIAGNOSIS — E43 Unspecified severe protein-calorie malnutrition: Secondary | ICD-10-CM | POA: Diagnosis not present

## 2018-10-24 DIAGNOSIS — I609 Nontraumatic subarachnoid hemorrhage, unspecified: Secondary | ICD-10-CM | POA: Diagnosis not present

## 2018-10-24 DIAGNOSIS — Z794 Long term (current) use of insulin: Secondary | ICD-10-CM

## 2018-10-24 DIAGNOSIS — Z885 Allergy status to narcotic agent status: Secondary | ICD-10-CM

## 2018-10-24 DIAGNOSIS — S2241XA Multiple fractures of ribs, right side, initial encounter for closed fracture: Secondary | ICD-10-CM | POA: Diagnosis not present

## 2018-10-24 DIAGNOSIS — G1221 Amyotrophic lateral sclerosis: Secondary | ICD-10-CM | POA: Diagnosis not present

## 2018-10-24 DIAGNOSIS — R0602 Shortness of breath: Secondary | ICD-10-CM | POA: Diagnosis not present

## 2018-10-24 DIAGNOSIS — R6521 Severe sepsis with septic shock: Secondary | ICD-10-CM | POA: Diagnosis not present

## 2018-10-24 DIAGNOSIS — Y33XXXA Other specified events, undetermined intent, initial encounter: Secondary | ICD-10-CM | POA: Diagnosis not present

## 2018-10-24 DIAGNOSIS — Z8249 Family history of ischemic heart disease and other diseases of the circulatory system: Secondary | ICD-10-CM

## 2018-10-24 DIAGNOSIS — F39 Unspecified mood [affective] disorder: Secondary | ICD-10-CM | POA: Diagnosis not present

## 2018-10-24 DIAGNOSIS — Z8349 Family history of other endocrine, nutritional and metabolic diseases: Secondary | ICD-10-CM

## 2018-10-24 DIAGNOSIS — G9340 Encephalopathy, unspecified: Secondary | ICD-10-CM | POA: Diagnosis not present

## 2018-10-24 DIAGNOSIS — E1169 Type 2 diabetes mellitus with other specified complication: Secondary | ICD-10-CM | POA: Diagnosis not present

## 2018-10-24 DIAGNOSIS — Z434 Encounter for attention to other artificial openings of digestive tract: Secondary | ICD-10-CM | POA: Diagnosis not present

## 2018-10-24 DIAGNOSIS — E785 Hyperlipidemia, unspecified: Secondary | ICD-10-CM | POA: Diagnosis not present

## 2018-10-24 DIAGNOSIS — Z833 Family history of diabetes mellitus: Secondary | ICD-10-CM

## 2018-10-24 DIAGNOSIS — J9690 Respiratory failure, unspecified, unspecified whether with hypoxia or hypercapnia: Secondary | ICD-10-CM | POA: Diagnosis present

## 2018-10-24 DIAGNOSIS — R6520 Severe sepsis without septic shock: Secondary | ICD-10-CM | POA: Diagnosis not present

## 2018-10-24 DIAGNOSIS — R1310 Dysphagia, unspecified: Secondary | ICD-10-CM | POA: Diagnosis not present

## 2018-10-24 DIAGNOSIS — D509 Iron deficiency anemia, unspecified: Secondary | ICD-10-CM | POA: Diagnosis not present

## 2018-10-24 DIAGNOSIS — D638 Anemia in other chronic diseases classified elsewhere: Secondary | ICD-10-CM | POA: Diagnosis not present

## 2018-10-24 DIAGNOSIS — R569 Unspecified convulsions: Secondary | ICD-10-CM | POA: Diagnosis not present

## 2018-10-24 DIAGNOSIS — R918 Other nonspecific abnormal finding of lung field: Secondary | ICD-10-CM | POA: Diagnosis not present

## 2018-10-24 DIAGNOSIS — I69391 Dysphagia following cerebral infarction: Secondary | ICD-10-CM | POA: Diagnosis not present

## 2018-10-24 DIAGNOSIS — B020 Zoster encephalitis: Secondary | ICD-10-CM | POA: Diagnosis not present

## 2018-10-24 DIAGNOSIS — F05 Delirium due to known physiological condition: Secondary | ICD-10-CM | POA: Diagnosis not present

## 2018-10-24 DIAGNOSIS — I350 Nonrheumatic aortic (valve) stenosis: Secondary | ICD-10-CM | POA: Diagnosis not present

## 2018-10-24 DIAGNOSIS — T8383XA Hemorrhage of genitourinary prosthetic devices, implants and grafts, initial encounter: Secondary | ICD-10-CM | POA: Diagnosis not present

## 2018-10-24 DIAGNOSIS — G92 Toxic encephalopathy: Secondary | ICD-10-CM | POA: Diagnosis not present

## 2018-10-24 DIAGNOSIS — Z466 Encounter for fitting and adjustment of urinary device: Secondary | ICD-10-CM | POA: Diagnosis not present

## 2018-10-24 DIAGNOSIS — Z87891 Personal history of nicotine dependence: Secondary | ICD-10-CM

## 2018-10-24 LAB — BLOOD GAS, ARTERIAL
Acid-Base Excess: 2.4 mmol/L — ABNORMAL HIGH (ref 0.0–2.0)
Bicarbonate: 26.4 mmol/L (ref 20.0–28.0)
FIO2: 0.6
MECHVT: 500 mL
O2 Saturation: 96.1 %
PEEP: 5 cmH2O
Patient temperature: 37
pCO2 arterial: 38 mmHg (ref 32.0–48.0)
pH, Arterial: 7.45 (ref 7.350–7.450)
pO2, Arterial: 79 mmHg — ABNORMAL LOW (ref 83.0–108.0)

## 2018-10-24 LAB — CBC WITH DIFFERENTIAL/PLATELET
Abs Immature Granulocytes: 0.04 10*3/uL (ref 0.00–0.07)
Basophils Absolute: 0 10*3/uL (ref 0.0–0.1)
Basophils Relative: 0 %
Eosinophils Absolute: 0 10*3/uL (ref 0.0–0.5)
Eosinophils Relative: 0 %
HCT: 40.4 % (ref 39.0–52.0)
Hemoglobin: 12.7 g/dL — ABNORMAL LOW (ref 13.0–17.0)
Immature Granulocytes: 0 %
Lymphocytes Relative: 11 %
Lymphs Abs: 1.3 10*3/uL (ref 0.7–4.0)
MCH: 29 pg (ref 26.0–34.0)
MCHC: 31.4 g/dL (ref 30.0–36.0)
MCV: 92.2 fL (ref 80.0–100.0)
Monocytes Absolute: 1.4 10*3/uL — ABNORMAL HIGH (ref 0.1–1.0)
Monocytes Relative: 12 %
NEUTROS PCT: 77 %
Neutro Abs: 8.8 10*3/uL — ABNORMAL HIGH (ref 1.7–7.7)
Platelets: 316 10*3/uL (ref 150–400)
RBC: 4.38 MIL/uL (ref 4.22–5.81)
RDW: 15 % (ref 11.5–15.5)
WBC: 11.6 10*3/uL — ABNORMAL HIGH (ref 4.0–10.5)
nRBC: 0 % (ref 0.0–0.2)

## 2018-10-24 LAB — COMPREHENSIVE METABOLIC PANEL
ALBUMIN: 3 g/dL — AB (ref 3.5–5.0)
ALT: 13 U/L (ref 0–44)
AST: 12 U/L — ABNORMAL LOW (ref 15–41)
Alkaline Phosphatase: 136 U/L — ABNORMAL HIGH (ref 38–126)
Anion gap: 9 (ref 5–15)
BUN: 18 mg/dL (ref 8–23)
CO2: 30 mmol/L (ref 22–32)
Calcium: 8.3 mg/dL — ABNORMAL LOW (ref 8.9–10.3)
Chloride: 100 mmol/L (ref 98–111)
Creatinine, Ser: 0.51 mg/dL — ABNORMAL LOW (ref 0.61–1.24)
GFR calc Af Amer: >60 mL/min (ref >60)
GFR calc non Af Amer: >60 mL/min (ref >60)
Glucose, Bld: 453 mg/dL — ABNORMAL HIGH (ref 70–99)
POTASSIUM: 4.2 mmol/L (ref 3.5–5.1)
SODIUM: 139 mmol/L (ref 135–145)
Total Bilirubin: 0.5 mg/dL (ref 0.3–1.2)
Total Protein: 6.9 g/dL (ref 6.5–8.1)

## 2018-10-24 LAB — PROTIME-INR
INR: 1.2 (ref 0.8–1.2)
Prothrombin Time: 14.7 seconds (ref 11.4–15.2)

## 2018-10-24 LAB — URINALYSIS, COMPLETE (UACMP) WITH MICROSCOPIC
Bacteria, UA: NONE SEEN
Bilirubin Urine: NEGATIVE
Hgb urine dipstick: NEGATIVE
Ketones, ur: 5 mg/dL — AB
LEUKOCYTE UA: NEGATIVE
Nitrite: NEGATIVE
Protein, ur: NEGATIVE mg/dL
Specific Gravity, Urine: 1.035 — ABNORMAL HIGH (ref 1.005–1.030)
pH: 5 (ref 5.0–8.0)

## 2018-10-24 LAB — PROCALCITONIN

## 2018-10-24 LAB — TROPONIN I

## 2018-10-24 LAB — LIPASE, BLOOD: Lipase: 18 U/L (ref 11–51)

## 2018-10-24 LAB — LACTIC ACID, PLASMA: Lactic Acid, Venous: 1.5 mmol/L (ref 0.5–1.9)

## 2018-10-24 LAB — BRAIN NATRIURETIC PEPTIDE: B Natriuretic Peptide: 63 pg/mL (ref 0.0–100.0)

## 2018-10-24 MED ORDER — IPRATROPIUM-ALBUTEROL 0.5-2.5 (3) MG/3ML IN SOLN
3.0000 mL | Freq: Once | RESPIRATORY_TRACT | Status: AC
  Administered 2018-10-24: 3 mL via RESPIRATORY_TRACT
  Filled 2018-10-24: qty 3

## 2018-10-24 MED ORDER — SODIUM CHLORIDE 0.9 % IV BOLUS (SEPSIS)
1000.0000 mL | Freq: Once | INTRAVENOUS | Status: AC
  Administered 2018-10-24: 1000 mL via INTRAVENOUS

## 2018-10-24 MED ORDER — IPRATROPIUM-ALBUTEROL 0.5-2.5 (3) MG/3ML IN SOLN: 3.0000 mL | RESPIRATORY_TRACT | Status: DC

## 2018-10-24 MED ORDER — CLINDAMYCIN PHOSPHATE 600 MG/50ML IV SOLN
600.0000 mg | Freq: Once | INTRAVENOUS | Status: AC
  Administered 2018-10-24: 600 mg via INTRAVENOUS
  Filled 2018-10-24 (×2): qty 50

## 2018-10-24 MED ORDER — LEVOFLOXACIN IN D5W 750 MG/150ML IV SOLN
750.0000 mg | Freq: Once | INTRAVENOUS | Status: AC
  Administered 2018-10-24: 750 mg via INTRAVENOUS
  Filled 2018-10-24: qty 150

## 2018-10-24 MED ORDER — SUCCINYLCHOLINE CHLORIDE 20 MG/ML IJ SOLN
1.5000 mg/kg | Freq: Once | INTRAMUSCULAR | Status: AC
  Administered 2018-10-24: 85 mg via INTRAVENOUS

## 2018-10-24 MED ORDER — FENTANYL 2500MCG IN NS 250ML (10MCG/ML) PREMIX INFUSION
100.0000 ug/h | INTRAVENOUS | Status: DC
  Filled 2018-10-24: qty 250

## 2018-10-24 MED ORDER — SODIUM CHLORIDE 0.9 % IV BOLUS (SEPSIS)
500.0000 mL | Freq: Once | INTRAVENOUS | Status: AC
  Administered 2018-10-24: 500 mL via INTRAVENOUS

## 2018-10-24 MED ORDER — SODIUM CHLORIDE 0.9 % IV BOLUS (SEPSIS)
250.0000 mL | Freq: Once | INTRAVENOUS | Status: AC
  Administered 2018-10-24: 250 mL via INTRAVENOUS

## 2018-10-24 MED ORDER — ETOMIDATE 2 MG/ML IV SOLN
15.0000 mg | Freq: Once | INTRAVENOUS | Status: AC
  Administered 2018-10-24: 15 mg via INTRAVENOUS

## 2018-10-24 MED ORDER — CLINDAMYCIN PHOSPHATE 600 MG/50ML IV SOLN: 600.0000 mg | Freq: Three times a day (TID) | INTRAVENOUS | Status: DC

## 2018-10-24 MED ORDER — PROPOFOL 1000 MG/100ML IV EMUL
5.0000 ug/kg/min | INTRAVENOUS | Status: DC
  Administered 2018-10-24: 5 ug/kg/min via INTRAVENOUS
  Filled 2018-10-24: qty 100

## 2018-10-24 MED ORDER — LEVOFLOXACIN IN D5W 750 MG/150ML IV SOLN: 750.0000 mg | INTRAVENOUS | Status: DC

## 2018-10-24 MED ORDER — DEXMEDETOMIDINE HCL IN NACL 400 MCG/100ML IV SOLN
0.4000 ug/kg/h | INTRAVENOUS | Status: DC
  Administered 2018-10-24: 0.5 ug/kg/h via INTRAVENOUS
  Filled 2018-10-24: qty 100

## 2018-10-24 NOTE — ED Notes (Signed)
Respiratory at bedside. MD Karma Greaser at bedside.

## 2018-10-24 NOTE — ED Triage Notes (Signed)
Pt presents to ed from [facility] with c/o shortness of breath. Facility reported to ems that pt began having labored breathing and using accesory muscles around 3pm today. Diminished breath sounds on the left lobes, crackles noted to right lobes.  BS 462. Axillary 98.7. Pt baseline non-verbal due to hx of stroke. Pt placed on non-rebreather 15L. Oxygen saturation 99%.

## 2018-10-24 NOTE — ED Notes (Signed)
Pt has G-tube present upon arrival to ED.

## 2018-10-24 NOTE — ED Notes (Signed)
.. ED TO INPATIENT HANDOFF REPORT  ED Nurse Name and Phone #: [name] [phone]  S Name/Age/Gender Maurice Patterson 63 y.o. male Room/Bed: ED17A/ED17A  Code Status   Code Status: Prior  Home/SNF/Other Skilled nursing facility {Patient oriented to: pt came in unresponsive, non-verbal Is this baseline? No   Triage Complete: Triage complete  Chief Complaint breathing difficulty  Triage Note Pt [facility] with c/o shortness of breath. Facility reported to ems that pt began having labored breathing and using accesory muscles around 3pm today. Diminished breath sounds on the left lobes, crackles noted to right lobes.  BS 462. Axillary 98.7. Pt baseline non-verbal due to hx of stroke. Pt placed on non-rebreather 15L. Oxygen saturation 99%.   Allergies Allergies  Allergen Reactions  . Hydrocodone Anaphylaxis  . Morphine Other (See Comments)    Loss of memory  . Ambien [Zolpidem] Other (See Comments)    delirium    . Brilinta [Ticagrelor] Other (See Comments)    Stroke   . Flexeril [Cyclobenzaprine] Other (See Comments)    delerium   . Flunitrazepam Other (See Comments)    ROHYPNOL (hallucinations)  . Haldol [Haloperidol Lactate] Other (See Comments)    delerium   . Levetiracetam Diarrhea and Other (See Comments)    Unable to walk  . Lorazepam Hives  . Risperdal [Risperidone] Other (See Comments)    Delirium   . Trazodone Other (See Comments)    Delirium Can take in low doses   . Benadryl [Diphenhydramine Hcl (Sleep)] Rash  . Penicillins Rash    Mouth ulcers Has patient had a PCN reaction causing immediate rash, facial/tongue/throat swelling, SOB or lightheadedness with hypotension: Yes Has patient had a PCN reaction causing severe rash involving mucus membranes or skin necrosis: No Has patient had a PCN reaction that required hospitalization No Has patient had a PCN reaction occurring within the last 10 years: Yes If all of the above answers are  "NO", then may proceed with Cephalosporin use.  . Vancomycin Rash    Rash around IV site during vancomycin infusion    Level of Care/Admitting Diagnosis ED Disposition    ED Disposition Condition [place]: [place] [[id number]]  Level of Care: ICU [6]  Diagnosis: Respiratory failure requiring intubation [facility]) [[id number]]  Admitting Physician: Arta Silence [[id number]]  Attending Physician: Arta Silence [[id number]]  Estimated length of stay: past midnight tomorrow  Certification:: I certify this patient will need inpatient services for at least 2 midnights  PT Class (Do Not Modify): Inpatient [101]  PT Acc Code (Do Not Modify): Private [1]       B Medical/Surgery History Past Medical History:  Diagnosis Date  . Acute MI ([place])   . Arthritis   . Back pain   . CAD (coronary artery disease)   . CHF (congestive heart failure) ([place])   . Chronic kidney disease   . Degenerative lumbar disc   . Dementia ([place])   . Diabetes mellitus without complication ([place])   . GERD (gastroesophageal reflux disease)   . Hernia of abdominal cavity   . Hyperlipemia   . Hypertension   . Malignant intraductal papillary mucinous tumor of pancreas ([place])   . Memory loss   . Pancreatitis   . Seizures ([place])    staring spells  . Shingles   . Stroke ([place]) 08/2017  . TIA (transient ischemic attack)    Past Surgical History:  Procedure Laterality Date  . CARDIAC  CATHETERIZATION    . CORONARY ANGIOPLASTY    . CYSTOSCOPY WITH STENT PLACEMENT Right 05/13/2017   Procedure: CYSTOSCOPY WITH STENT PLACEMENT;  Surgeon: Nickie Retort, MD;  Location: [facility] ORS;  Service: Urology;  Laterality: Right;  . ERCP N/A 09/12/2015   Procedure: ENDOSCOPIC RETROGRADE CHOLANGIOPANCREATOGRAPHY (ERCP);  Surgeon: Hulen Luster, MD;  Location: [facility] ENDOSCOPY;  Service: Gastroenterology;  Laterality: N/A;  . ERCP N/A 01/02/2016   Procedure: ENDOSCOPIC RETROGRADE  CHOLANGIOPANCREATOGRAPHY (ERCP);  Surgeon: Hulen Luster, MD;  Location: [facility] ENDOSCOPY;  Service: Gastroenterology;  Laterality: N/A;  . EXTERNAL FIXATION WRIST FRACTURE Left   . left arm metal plate    . LEFT HEART CATH AND CORONARY ANGIOGRAPHY Left 11/04/2016   Procedure: Left Heart Cath and Coronary Angiography;  Surgeon: Isaias Cowman, MD;  Location: [facility];  Service: Cardiovascular;  Laterality: Left;  . Left shoulder surgery    . PEG PLACEMENT Left 08/28/2018   Procedure: PERCUTANEOUS ENDOSCOPIC GASTROSTOMY (PEG) PLACEMENT;  Surgeon: Lin Landsman, MD;  Location: [facility] ENDOSCOPY;  Service: Gastroenterology;  Laterality: Left;  . Stent times 3  2013   Cardiac  . TOTAL ELBOW REPLACEMENT Left   . VASECTOMY    . VENTRICULOPERITONEAL SHUNT       A IV Location/Drains/Wounds Patient Lines/Drains/Airways Status   Active Line/Drains/Airways    Name:   Placement date:   Placement time:   Site:   Days:   Peripheral IV 10/24/18 Right Forearm   10/24/18    0023    Forearm   less than 1   Peripheral IV 10/24/18 Left Hand   10/24/18    0035    Hand   less than 1   Peripheral IV 10/24/18 Right Hand   10/24/18    0054    Hand   less than 1   Gastrostomy/Enterostomy Percutaneous endoscopic gastrostomy (PEG) LUQ   09/20/18    1733    LUQ   34   Urethral Catheter Cole,RN Temperature probe 16 Fr.   10/24/18    0137    Temperature probe   less than 1   Airway 7.5 mm   10/24/18    0134     less than 1   Pressure Injury 09/23/18 Deep Tissue Injury - Purple or maroon localized area of discolored intact skin or blood-filled blister due to damage of underlying soft tissue from pressure and/or shear.   09/23/18    1007     31          Intake/Output Last 24 hours  Intake/Output Summary (Last 24 hours) at 10/24/2018 0256 Last data filed at 10/24/2018 0239 Gross per 24 hour  Intake 1943.02 ml  Output -  Net 1943.02 ml    Labs/Imaging Results for orders placed or performed during the  hospital encounter of 10/24/18 (from the past 48 hour(s))  Comprehensive metabolic panel     Status: Abnormal   Collection Time: 10/24/18 12:10 AM  Result Value Ref Range   Sodium 139 135 - 145 mmol/L   Potassium 4.2 3.5 - 5.1 mmol/L   Chloride 100 98 - 111 mmol/L   CO2 30 22 - 32 mmol/L   Glucose, Bld 453 (H) 70 - 99 mg/dL   BUN 18 8 - 23 mg/dL   Creatinine, Ser 0.51 (L) 0.61 - 1.24 mg/dL   Calcium 8.3 (L) 8.9 - 10.3 mg/dL   Total Protein 6.9 6.5 - 8.1 g/dL   Albumin 3.0 (L) 3.5 - 5.0 g/dL  AST 12 (L) 15 - 41 U/L   ALT 13 0 - 44 U/L   Alkaline Phosphatase 136 (H) 38 - 126 U/L   Total Bilirubin 0.5 0.3 - 1.2 mg/dL   GFR calc non Af Amer >60 >60 mL/min   GFR calc Af Amer >60 >60 mL/min   Anion gap 9 5 - 15    Comment: Performed at [facility], [address] [place], [place] [address]  Lipase, blood     Status: None   Collection Time: 10/24/18 12:10 AM  Result Value Ref Range   Lipase 18 11 - 51 U/L    Comment: Performed at [facility], [address] [place], [place] [address]  CBC WITH DIFFERENTIAL     Status: Abnormal   Collection Time: 10/24/18 12:10 AM  Result Value Ref Range   WBC 11.6 (H) 4.0 - 10.5 K/uL   RBC 4.38 4.22 - 5.81 MIL/uL   Hemoglobin 12.7 (L) 13.0 - 17.0 g/dL   HCT 40.4 39.0 - 52.0 %   MCV 92.2 80.0 - 100.0 fL   MCH 29.0 26.0 - 34.0 pg   MCHC 31.4 30.0 - 36.0 g/dL   RDW 15.0 11.5 - 15.5 %   Platelets 316 150 - 400 K/uL   nRBC 0.0 0.0 - 0.2 %   Neutrophils Relative % 77 %   Neutro Abs 8.8 (H) 1.7 - 7.7 K/uL   Lymphocytes Relative 11 %   Lymphs Abs 1.3 0.7 - 4.0 K/uL   Monocytes Relative 12 %   Monocytes Absolute 1.4 (H) 0.1 - 1.0 K/uL   Eosinophils Relative 0 %   Eosinophils Absolute 0.0 0.0 - 0.5 K/uL   Basophils Relative 0 %   Basophils Absolute 0.0 0.0 - 0.1 K/uL   Immature Granulocytes 0 %   Abs Immature Granulocytes 0.04 0.00 - 0.07 K/uL    Comment: Performed at [facility], [address]  [place], [place] [address]  Procalcitonin     Status: None   Collection Time: 10/24/18 12:10 AM  Result Value Ref Range   Procalcitonin <0.10 ng/mL    Comment:        Interpretation: PCT (Procalcitonin) <= 0.5 ng/mL: Systemic infection (sepsis) is not likely. Local bacterial infection is possible. (NOTE)       Sepsis PCT Algorithm           Lower Respiratory Tract                                      Infection PCT Algorithm    ----------------------------     ----------------------------         PCT < 0.25 ng/mL                PCT < 0.10 ng/mL         Strongly encourage             Strongly discourage   discontinuation of antibiotics    initiation of antibiotics    ----------------------------     -----------------------------       PCT 0.25 - 0.50 ng/mL            PCT 0.10 - 0.25 ng/mL               OR       >80% decrease in PCT  Discourage initiation of                                            antibiotics      Encourage discontinuation           of antibiotics    ----------------------------     -----------------------------         PCT >= 0.50 ng/mL              PCT 0.26 - 0.50 ng/mL               AND        <80% decrease in PCT             Encourage initiation of                                             antibiotics       Encourage continuation           of antibiotics    ----------------------------     -----------------------------        PCT >= 0.50 ng/mL                  PCT > 0.50 ng/mL               AND         increase in PCT                  Strongly encourage                                      initiation of antibiotics    Strongly encourage escalation           of antibiotics                                     -----------------------------                                           PCT <= 0.25 ng/mL                                                 OR                                        > 80% decrease in PCT                                      Discontinue / Do not initiate  antibiotics Performed at [facility], [address] [place], [place] [address]   Protime-INR     Status: None   Collection Time: 10/24/18 12:10 AM  Result Value Ref Range   Prothrombin Time 14.7 11.4 - 15.2 seconds   INR 1.2 0.8 - 1.2    Comment: (NOTE) INR goal varies based on device and disease states. Performed at [facility], [address] [place], [place] [address]   Lactic acid, plasma     Status: None   Collection Time: 10/24/18 12:18 AM  Result Value Ref Range   Lactic Acid, Venous 1.5 0.5 - 1.9 mmol/L    Comment: Performed at [facility], [address] [place], [place] [address]  Brain natriuretic peptide - IF patient is dyspneic     Status: None   Collection Time: 10/24/18 12:18 AM  Result Value Ref Range   B Natriuretic Peptide 63.0 0.0 - 100.0 pg/mL    Comment: Performed at [facility], [address] [place], [place] [address]  Urinalysis, Complete w Microscopic     Status: Abnormal   Collection Time: 10/24/18  1:10 AM  Result Value Ref Range   Color, Urine YELLOW (A) YELLOW   APPearance CLEAR (A) CLEAR   Specific Gravity, Urine 1.035 (H) 1.005 - 1.030   pH 5.0 5.0 - 8.0   Glucose, UA >=500 (A) NEGATIVE mg/dL   Hgb urine dipstick NEGATIVE NEGATIVE   Bilirubin Urine NEGATIVE NEGATIVE   Ketones, ur 5 (A) NEGATIVE mg/dL   Protein, ur NEGATIVE NEGATIVE mg/dL   Nitrite NEGATIVE NEGATIVE   Leukocytes,Ua NEGATIVE NEGATIVE   RBC / HPF 0-5 0 - 5 RBC/hpf   WBC, UA 0-5 0 - 5 WBC/hpf   Bacteria, UA NONE SEEN NONE SEEN   Squamous Epithelial / LPF 0-5 0 - 5   Mucus PRESENT     Comment: Performed at [facility], [address], [place], [place] [address]   Ct Head Wo Contrast  Result Date: 10/24/2018 CLINICAL DATA:  Altered level of consciousness. EXAM: CT HEAD WITHOUT CONTRAST TECHNIQUE: Contiguous axial images were obtained from the  base of the skull through the vertex without intravenous contrast. COMPARISON:  Head CT 09/19/2018, brain MRI 07/28/2018 FINDINGS: Brain: Left frontal hemorrhage has both subarachnoid and intraparenchymal component, measuring approximately 1.4 x 1.3 x 1.3 cm (volume = 1.2 cm^3). Mild surrounding edema. Trace dependent blood in the right lateral ventricle. Again seen age advanced atrophy. Unchanged chronic small vessel ischemia. No evidence of acute ischemia. Basilar cisterns are patent. Vascular: Skull base atherosclerosis without hyperdense vessel. Skull: No fracture or focal lesion. Sinuses/Orbits: Paranasal sinuses and mastoid air cells are clear. The visualized orbits are unremarkable. Endotracheal tube partially included. Other: None. IMPRESSION: 1. Small left frontal hemorrhage with both subarachnoid and intraparenchymal components. Mild surrounding edema. Trace dependent blood in the right lateral ventricle. 2. Unchanged age advanced atrophy and chronic small vessel ischemia. Critical Value/emergent results were called by telephone at the time of interpretation on 10/24/2018 at 2:43 am to Dr. Hinda Kehr , who verbally acknowledged these results. Electronically Signed   By: Keith Rake M.D.   On: 10/24/2018 02:43   Ct Chest Wo Contrast  Result Date: 10/24/2018 CLINICAL DATA:  Acute respiratory distress, unresponsive EXAM: CT CHEST WITHOUT CONTRAST TECHNIQUE: Multidetector CT imaging of the chest was performed following the standard protocol without IV contrast. COMPARISON:  Radiograph 10/24/2018 FINDINGS: Cardiovascular: Limited evaluation without intravenous contrast. Moderate aortic atherosclerosis. No aneurysm. Coronary  vascular calcification. Normal heart size. No pericardial effusion Mediastinum/Nodes: Midline trachea. Endotracheal tube tip terminates slightly above the carina. Prominent mediastinal lymph nodes. Right low paratracheal lymph node measures 13 mm. Subcarinal lymph node measures 13  mm. Esophagus within normal limits. Lungs/Pleura: Mild subpleural nodularity and reticulations suggesting mild fibrosis. Trace right pleural effusion. Consolidation, ground-glass density and mild nodularity within the right greater than left posterior lower lobes. Upper Abdomen: No acute abnormality. Partially visualized balloon in the stomach. Musculoskeletal: Degenerative changes of the spine. No acute or suspicious abnormality. IMPRESSION: 1. Small right pleural effusion. Consolidation, ground-glass density and mild nodularity within the right greater than left posterior lower lobes, suspicious for pneumonia and or aspiration. 2. Mild mediastinal adenopathy, likely reactive Aortic Atherosclerosis (ICD10-I70.0). Electronically Signed   By: Donavan Foil M.D.   On: 10/24/2018 02:45   Dg Chest Portable 1 View  Result Date: 10/24/2018 CLINICAL DATA:  Tube placement EXAM: PORTABLE CHEST 1 VIEW COMPARISON:  10/24/2018, 10/07/2018, 09/28/2018, 08/08/2018, 04/04/2016 FINDINGS: Placement of endotracheal tube, the tip is about 8 mm superior to the carina. No interval consolidation or development of pleural effusion. Coarse interstitial opacity at the bases consistent with chronic change. Stable cardiomediastinal silhouette. No pneumothorax. IMPRESSION: 1. Endotracheal tube tip about 8 mm superior to carina. 2. No change in coarse basilar opacities, likely scarring Electronically Signed   By: Donavan Foil M.D.   On: 10/24/2018 01:59   Dg Chest Port 1 View  Result Date: 10/24/2018 CLINICAL DATA:  Initial evaluation for acute sepsis. EXAM: PORTABLE CHEST 1 VIEW COMPARISON:  Prior radiograph from 10/07/2018. FINDINGS: Cardiac and mediastinal silhouettes are stable in size and contour, and remain within normal limits. Coronary stent noted. Aortic atherosclerosis noted as well. Lungs hypoinflated. Secondary bibasilar atelectasis and/or bronchovascular crowding. Underlying emphysematous changes likely present. No  consolidative opacity to suggest pneumonia. No edema or effusion. Nodular density overlying the peripheral left lung base favored to reflect a nipple shadow. No pneumothorax. No acute osseous finding.  Osteopenia noted. IMPRESSION: 1. Low lung volumes with associated bibasilar atelectasis/bronchovascular crowding. 2. No other radiographic evidence for active cardiopulmonary disease. Electronically Signed   By: Jeannine Boga M.D.   On: 10/24/2018 00:58    Pending Labs Unresulted Labs (From admission, onward)    Start     Ordered   10/24/18 0240  Rapid HIV screen (HIV 1/2 Ab+Ag) ([place] Only)  Add-on,   AD     10/24/18 0239   10/24/18 0240  Culture, sputum-assessment  Once,   STAT     10/24/18 0239   10/24/18 0240  Gram stain  Once,   STAT     10/24/18 0239   10/24/18 0240  Strep pneumoniae urinary antigen  Once,   STAT     10/24/18 0239   10/24/18 0240  Legionella Pneumophila Serogp 1 Ur Ag  Once,   STAT     10/24/18 0239   10/24/18 0240  Influenza panel by PCR (type A & B)  (Influenza PCR Panel)  Once,   STAT     10/24/18 0239   10/24/18 0239  TSH  Add-on,   AD     10/24/18 0239   10/24/18 0239  Vitamin B12  Add-on,   AD     10/24/18 0239   10/24/18 0239  Folate  Add-on,   AD     10/24/18 0239   10/24/18 0239  RPR  Add-on,   AD     10/24/18 0239   10/24/18 0239  Ammonia  Once,   STAT     10/24/18 0239   10/24/18 0239  Hemoglobin A1c  Add-on,   AD     10/24/18 0239   10/24/18 0237  Magnesium  Add-on,   AD    Question:  Specimen collection method  Answer:  Unit=Unit collect   10/24/18 0237   10/24/18 0237  Phosphorus  Add-on,   AD    Question:  Specimen collection method  Answer:  Unit=Unit collect   10/24/18 0237   10/24/18 0237  Calcium, ionized  Once,   STAT    Question:  Specimen collection method  Answer:  Unit=Unit collect   10/24/18 0237   10/24/18 0237  Prealbumin  Add-on,   AD    Question:  Specimen collection method  Answer:  Unit=Unit collect   10/24/18 0237    10/24/18 0230  Blood gas, arterial  ONCE - STAT,   STAT    Question:  Room air or oxygen  Answer:  Oxygen   10/24/18 0229   10/24/18 0152  Troponin I - Add-On to previous collection  Add-on,   AD     10/24/18 0151   10/24/18 0020  Lactic acid, plasma  STAT Now then every 3 hours,   STAT     10/24/18 0025   10/24/18 0020  Blood Culture (routine x 2)  BLOOD CULTURE X 2,   STAT     10/24/18 0025   10/24/18 0020  Urine culture  Add-on,   AD     10/24/18 0025          Vitals/Pain Today's Vitals   10/24/18 0215 10/24/18 0235 10/24/18 0240 10/24/18 0245  BP: 120/77 (!) 82/66 102/76 95/69  Pulse: 88 78 78 76  Resp: 20 12 19 17   Temp:      TempSrc:      SpO2: 100% 99% 100% 100%  Weight:      Height:        Isolation Precautions No active isolations  Medications Medications  ipratropium-albuterol (DUONEB) 0.5-2.5 (3) MG/3ML nebulizer solution 3 mL (has no administration in time range)  dexmedetomidine (PRECEDEX) 400 MCG/100ML (4 mcg/mL) infusion (0.5 mcg/kg/hr  56.7 kg Intravenous New Bag/Given 10/24/18 0240)  fentaNYL 2555mcg in NS 228mL (77mcg/ml) infusion-PREMIX (50 mcg/hr Intravenous Rate/Dose Change 10/24/18 0156)  ipratropium-albuterol (DUONEB) 0.5-2.5 (3) MG/3ML nebulizer solution 3 mL (has no administration in time range)  clindamycin (CLEOCIN) IVPB 600 mg (has no administration in time range)  levofloxacin (LEVAQUIN) IVPB 750 mg (has no administration in time range)  sodium chloride 0.9 % bolus 1,000 mL (0 mLs Intravenous Stopped 10/24/18 0155)    And  sodium chloride 0.9 % bolus 500 mL (0 mLs Intravenous Stopped 10/24/18 0239)    And  sodium chloride 0.9 % bolus 250 mL (0 mLs Intravenous Stopped 10/24/18 0239)  clindamycin (CLEOCIN) IVPB 600 mg (0 mg Intravenous Stopped 10/24/18 0120)  levofloxacin (LEVAQUIN) IVPB 750 mg (0 mg Intravenous Stopped 10/24/18 0239)  etomidate (AMIDATE) injection 15 mg (15 mg Intravenous Given 10/24/18 0133)  succinylcholine (ANECTINE) injection 85 mg (85  mg Intravenous Given 10/24/18 0134)    Mobility non-ambulatory High fall risk   Focused Assessments Neuro Assessment Handoff:  Swallow screen pass? No          Neuro Assessment: Exceptions to [facility] Neuro Checks:      Last Documented NIHSS Modified Score:   Has TPA been given? No If patient is a Neuro Trauma and patient is going to OR before floor  call report to [place] nurse: [phone] or [phone]  , Pulmonary Assessment Handoff:  Lung sounds: Bilateral Breath Sounds: Diminished L Breath Sounds: Diminished R Breath Sounds: Diminished O2 Device: Bi-PAP        R Recommendations: See Admitting Provider Note  Report given to:   Additional Notes:

## 2018-10-24 NOTE — H&P (Addendum)
[facility] NAME: Maurice Patterson    MR#:  [id number]  DATE OF BIRTH:  07/09/1956  DATE OF ADMISSION:  10/24/2018  PRIMARY CARE PHYSICIAN: [name], MD   REQUESTING/REFERRING PHYSICIAN: Hinda Kehr, MD  CHIEF COMPLAINT:   Chief Complaint  Patient presents with  . Shortness of Breath    HISTORY OF PRESENT ILLNESS:  Maurice Patterson  is a 63 y.o. male with a litany of chronic medical issues, including but not limited to T2IDDM, HTN, HLD, CAD/MI (s/p stents), CVA (non-verbal, PEG), recurrent/chronic aspiration, Alzheimer's dementia, GERD, chronic pancreatitis, Sz D/O, who p/w respiratory failure necessitating intubation. Pt is non-verbal at baseline 2/2 Hx CVA. He resides at [facility]. Per my understanding, pt has rapidly deteriorated (globally) in the past year. He has had recurrent hospitalizations for aspiration. On present admission, I am told that pt was sent over from the facility 2/2 SOB, labored breathing, retractions and accessory muscle use. He was reportedly poorly responsive (withdrawing from painful stimuli) and not protecting his airway whilst in the ED; he was intubated and sedated. Poor air movement, diffuse coarse rhochi B/L on lung exam. Family is not available at bedside, and is also not reachable by phone. Based on prior documentation, pt is full code.  PAST MEDICAL HISTORY:   Past Medical History:  Diagnosis Date  . Acute MI ([place])   . Arthritis   . Back pain   . CAD (coronary artery disease)   . CHF (congestive heart failure) ([place])   . Chronic kidney disease   . Degenerative lumbar disc   . Dementia ([place])   . Diabetes mellitus without complication ([place])   . GERD (gastroesophageal reflux disease)   . Hernia of abdominal cavity   . Hyperlipemia   . Hypertension   . Malignant intraductal papillary mucinous tumor of pancreas ([place])   . Memory loss   . Pancreatitis   . Seizures ([place])    staring  spells  . Shingles   . Stroke ([place]) 08/2017  . TIA (transient ischemic attack)     PAST SURGICAL HISTORY:   Past Surgical History:  Procedure Laterality Date  . CARDIAC CATHETERIZATION    . CORONARY ANGIOPLASTY    . CYSTOSCOPY WITH STENT PLACEMENT Right 05/13/2017   Procedure: CYSTOSCOPY WITH STENT PLACEMENT;  Surgeon: Nickie Retort, MD;  Location: [facility] ORS;  Service: Urology;  Laterality: Right;  . ERCP N/A 09/12/2015   Procedure: ENDOSCOPIC RETROGRADE CHOLANGIOPANCREATOGRAPHY (ERCP);  Surgeon: Hulen Luster, MD;  Location: [facility] ENDOSCOPY;  Service: Gastroenterology;  Laterality: N/A;  . ERCP N/A 01/02/2016   Procedure: ENDOSCOPIC RETROGRADE CHOLANGIOPANCREATOGRAPHY (ERCP);  Surgeon: Hulen Luster, MD;  Location: [facility] ENDOSCOPY;  Service: Gastroenterology;  Laterality: N/A;  . EXTERNAL FIXATION WRIST FRACTURE Left   . left arm metal plate    . LEFT HEART CATH AND CORONARY ANGIOGRAPHY Left 11/04/2016   Procedure: Left Heart Cath and Coronary Angiography;  Surgeon: Isaias Cowman, MD;  Location: [facility];  Service: Cardiovascular;  Laterality: Left;  . Left shoulder surgery    . PEG PLACEMENT Left 08/28/2018   Procedure: PERCUTANEOUS ENDOSCOPIC GASTROSTOMY (PEG) PLACEMENT;  Surgeon: Lin Landsman, MD;  Location: [facility] ENDOSCOPY;  Service: Gastroenterology;  Laterality: Left;  . Stent times 3  2013   Cardiac  . TOTAL ELBOW REPLACEMENT Left   . VASECTOMY    . VENTRICULOPERITONEAL SHUNT      SOCIAL HISTORY:   Social History  Tobacco Use  . Smoking status: Former Smoker    Last attempt to quit: 2013    Years since quitting: 7.1  . Smokeless tobacco: Never Used  . Tobacco comment: used to smoke 2PD for 40 yrs, quit about 4 years ago  Substance Use Topics  . Alcohol use: No    Alcohol/week: 0.0 standard drinks    Comment: occasional    FAMILY HISTORY:   Family History  Problem Relation Age of Onset  . Diabetes Mother   . Hypertension Mother   . CAD Mother    . Hyperlipidemia Mother   . Stroke Mother   . ALS Mother   . Alzheimer's disease Father   . Diabetes Father   . Heart Problems Brother     DRUG ALLERGIES:   Allergies  Allergen Reactions  . Hydrocodone Anaphylaxis  . Morphine Other (See Comments)    Loss of memory  . Ambien [Zolpidem] Other (See Comments)    delirium    . Brilinta [Ticagrelor] Other (See Comments)    Stroke   . Flexeril [Cyclobenzaprine] Other (See Comments)    delerium   . Flunitrazepam Other (See Comments)    ROHYPNOL (hallucinations)  . Haldol [Haloperidol Lactate] Other (See Comments)    delerium   . Levetiracetam Diarrhea and Other (See Comments)    Unable to walk  . Lorazepam Hives  . Risperdal [Risperidone] Other (See Comments)    Delirium   . Trazodone Other (See Comments)    Delirium Can take in low doses   . Benadryl [Diphenhydramine Hcl (Sleep)] Rash  . Penicillins Rash    Mouth ulcers Has patient had a PCN reaction causing immediate rash, facial/tongue/throat swelling, SOB or lightheadedness with hypotension: Yes Has patient had a PCN reaction causing severe rash involving mucus membranes or skin necrosis: No Has patient had a PCN reaction that required hospitalization No Has patient had a PCN reaction occurring within the last 10 years: Yes If all of the above answers are "NO", then may proceed with Cephalosporin use.  . Vancomycin Rash    Rash around IV site during vancomycin infusion    REVIEW OF SYSTEMS:   Review of Systems  Unable to perform ROS: Intubated  Respiratory: Positive for shortness of breath.    Non-verbal at baseline. Intubated. MEDICATIONS AT HOME:   Prior to Admission medications   Medication Sig Start Date End Date Taking? Authorizing Provider  acetaminophen (TYLENOL) 500 MG tablet Place 1 tablet (500 mg total) into feeding tube every 6 (six) hours as needed for mild pain. 09/06/18   Hillary Bow, MD  albuterol (PROVENTIL) (2.5 MG/3ML) 0.083% nebulizer  solution Take 3 mLs (2.5 mg total) by nebulization every 3 (three) hours as needed for wheezing or shortness of breath. 09/06/18   Hillary Bow, MD  aspirin 81 MG chewable tablet Place 1 tablet (81 mg total) into feeding tube daily. 09/07/18   Hillary Bow, MD  cholecalciferol (D-VI-SOL) 10 MCG/ML LIQD Place 2.5 mLs (1,000 Units total) into feeding tube daily. 09/07/18   Hillary Bow, MD  clopidogrel (PLAVIX) 75 MG tablet Place 1 tablet (75 mg total) into feeding tube daily. 09/07/18   Hillary Bow, MD  donepezil (ARICEPT) 10 MG tablet Place 1 tablet (10 mg total) into feeding tube 2 (two) times daily. 09/06/18   Hillary Bow, MD  insulin aspart (NOVOLOG) 100 UNIT/ML injection Inject 3 Units into the skin every 6 (six) hours. 09/06/18   Hillary Bow, MD  Insulin Glargine (LANTUS SOLOSTAR) 100 UNIT/ML  Solostar Pen Inject 4 Units into the skin at bedtime. 09/29/18   Fritzi Mandes, MD  memantine (NAMENDA) 10 MG tablet Place 1 tablet (10 mg total) into feeding tube 2 (two) times daily. 09/06/18   Hillary Bow, MD  metoCLOPramide (REGLAN) 5 MG tablet Place 1 tablet (5 mg total) into feeding tube 4 (four) times daily -  before meals and at bedtime. 09/29/18   Fritzi Mandes, MD  metoprolol tartrate (LOPRESSOR) 25 MG tablet Place 1 tablet (25 mg total) into feeding tube 2 (two) times daily. 09/06/18   Hillary Bow, MD  Nutritional Supplements (FEEDING SUPPLEMENT, GLUCERNA 1.5 CAL,) LIQD Place 1,000 mLs into feeding tube continuous. 09/06/18   Hillary Bow, MD  nystatin (MYCOSTATIN) 100000 UNIT/ML suspension Take 5 mLs (500,000 Units total) by mouth 4 (four) times daily. 10/08/18   Nance Pear, MD  pantoprazole sodium (PROTONIX) 40 mg/20 mL PACK Place 20 mLs (40 mg total) into feeding tube daily. 09/07/18   Hillary Bow, MD  potassium chloride (KLOR-CON) 20 MEQ packet Place 20 mEq into feeding tube daily. 09/06/18   Sudini, Srikar, MD  sodium chloride 0.9 % infusion Inject 500 mLs into the vein daily.  Please infuse over 2 hours 10/08/18   Nance Pear, MD  Water For Irrigation, Sterile (FREE WATER) SOLN Place 180 mLs into feeding tube every 4 (four) hours. 09/06/18   Hillary Bow, MD      VITAL SIGNS:  Blood pressure 95/69, pulse 76, temperature 99.5 F (37.5 C), temperature source Other (Comment), resp. rate 17, height 5\' 8"  (1.727 m), weight 56.7 kg, SpO2 100 %.  PHYSICAL EXAMINATION:  Physical Exam Constitutional:      General: He is sleeping.     Appearance: He is cachectic. He is ill-appearing, toxic-appearing and diaphoretic.     Interventions: He is sedated and intubated.  HENT:     Mouth/Throat:     Mouth: Mucous membranes are dry.  Eyes:     General: Lids are normal. No scleral icterus.    Conjunctiva/sclera: Conjunctivae normal.  Neck:     Musculoskeletal: Neck supple.  Cardiovascular:     Rate and Rhythm: Normal rate and regular rhythm.  No extrasystoles are present.    Heart sounds: Normal heart sounds, S1 normal and S2 normal. Heart sounds not distant. No murmur. No friction rub. No gallop.   Pulmonary:     Effort: He is intubated.     Breath sounds: Decreased air movement present. No stridor or transmitted upper airway sounds. Examination of the right-upper field reveals decreased breath sounds and rhonchi. Examination of the left-upper field reveals decreased breath sounds and rhonchi. Examination of the right-middle field reveals decreased breath sounds and rhonchi. Examination of the left-middle field reveals decreased breath sounds and rhonchi. Examination of the right-lower field reveals decreased breath sounds and rhonchi. Examination of the left-lower field reveals decreased breath sounds and rhonchi. Decreased breath sounds and rhonchi present. No wheezing or rales.  Abdominal:     General: Bowel sounds are decreased. There is no distension.     Palpations: Abdomen is soft.     Tenderness: There is no abdominal tenderness. There is no guarding or rebound.    Musculoskeletal:        General: No swelling.     Right lower leg: No edema.     Left lower leg: No edema.  Lymphadenopathy:     Cervical: No cervical adenopathy.  Skin:    General: Skin is warm.     Findings:  No erythema or rash.  Neurological:     Comments: Intubated/sedated.  Psychiatric:        Attention and Perception: He is inattentive.        Speech: He is noncommunicative.     Comments: Intubated/sedated.    LABORATORY PANEL:   CBC Recent Labs  Lab 10/24/18 0010  WBC 11.6*  HGB 12.7*  HCT 40.4  PLT 316   ------------------------------------------------------------------------------------------------------------------  Chemistries  Recent Labs  Lab 10/24/18 0010  NA 139  K 4.2  CL 100  CO2 30  GLUCOSE 453*  BUN 18  CREATININE 0.51*  CALCIUM 8.3*  AST 12*  ALT 13  ALKPHOS 136*  BILITOT 0.5   ------------------------------------------------------------------------------------------------------------------  Cardiac Enzymes No results for input(s): TROPONINI in the last 168 hours. ------------------------------------------------------------------------------------------------------------------  RADIOLOGY:  Ct Head Wo Contrast  Result Date: 10/24/2018 CLINICAL DATA:  Altered level of consciousness. EXAM: CT HEAD WITHOUT CONTRAST TECHNIQUE: Contiguous axial images were obtained from the base of the skull through the vertex without intravenous contrast. COMPARISON:  Head CT 09/19/2018, brain MRI 07/28/2018 FINDINGS: Brain: Left frontal hemorrhage has both subarachnoid and intraparenchymal component, measuring approximately 1.4 x 1.3 x 1.3 cm (volume = 1.2 cm^3). Mild surrounding edema. Trace dependent blood in the right lateral ventricle. Again seen age advanced atrophy. Unchanged chronic small vessel ischemia. No evidence of acute ischemia. Basilar cisterns are patent. Vascular: Skull base atherosclerosis without hyperdense vessel. Skull: No fracture or focal  lesion. Sinuses/Orbits: Paranasal sinuses and mastoid air cells are clear. The visualized orbits are unremarkable. Endotracheal tube partially included. Other: None. IMPRESSION: 1. Small left frontal hemorrhage with both subarachnoid and intraparenchymal components. Mild surrounding edema. Trace dependent blood in the right lateral ventricle. 2. Unchanged age advanced atrophy and chronic small vessel ischemia. Critical Value/emergent results were called by telephone at the time of interpretation on 10/24/2018 at 2:43 am to Dr. Hinda Kehr , who verbally acknowledged these results. Electronically Signed   By: Keith Rake M.D.   On: 10/24/2018 02:43   Ct Chest Wo Contrast  Result Date: 10/24/2018 CLINICAL DATA:  Acute respiratory distress, unresponsive EXAM: CT CHEST WITHOUT CONTRAST TECHNIQUE: Multidetector CT imaging of the chest was performed following the standard protocol without IV contrast. COMPARISON:  Radiograph 10/24/2018 FINDINGS: Cardiovascular: Limited evaluation without intravenous contrast. Moderate aortic atherosclerosis. No aneurysm. Coronary vascular calcification. Normal heart size. No pericardial effusion Mediastinum/Nodes: Midline trachea. Endotracheal tube tip terminates slightly above the carina. Prominent mediastinal lymph nodes. Right low paratracheal lymph node measures 13 mm. Subcarinal lymph node measures 13 mm. Esophagus within normal limits. Lungs/Pleura: Mild subpleural nodularity and reticulations suggesting mild fibrosis. Trace right pleural effusion. Consolidation, ground-glass density and mild nodularity within the right greater than left posterior lower lobes. Upper Abdomen: No acute abnormality. Partially visualized balloon in the stomach. Musculoskeletal: Degenerative changes of the spine. No acute or suspicious abnormality. IMPRESSION: 1. Small right pleural effusion. Consolidation, ground-glass density and mild nodularity within the right greater than left posterior lower  lobes, suspicious for pneumonia and or aspiration. 2. Mild mediastinal adenopathy, likely reactive Aortic Atherosclerosis (ICD10-I70.0). Electronically Signed   By: Donavan Foil M.D.   On: 10/24/2018 02:45   Dg Chest Portable 1 View  Result Date: 10/24/2018 CLINICAL DATA:  Tube placement EXAM: PORTABLE CHEST 1 VIEW COMPARISON:  10/24/2018, 10/07/2018, 09/28/2018, 08/08/2018, 04/04/2016 FINDINGS: Placement of endotracheal tube, the tip is about 8 mm superior to the carina. No interval consolidation or development of pleural effusion. Coarse interstitial opacity at the bases consistent  with chronic change. Stable cardiomediastinal silhouette. No pneumothorax. IMPRESSION: 1. Endotracheal tube tip about 8 mm superior to carina. 2. No change in coarse basilar opacities, likely scarring Electronically Signed   By: Donavan Foil M.D.   On: 10/24/2018 01:59   Dg Chest Port 1 View  Result Date: 10/24/2018 CLINICAL DATA:  Initial evaluation for acute sepsis. EXAM: PORTABLE CHEST 1 VIEW COMPARISON:  Prior radiograph from 10/07/2018. FINDINGS: Cardiac and mediastinal silhouettes are stable in size and contour, and remain within normal limits. Coronary stent noted. Aortic atherosclerosis noted as well. Lungs hypoinflated. Secondary bibasilar atelectasis and/or bronchovascular crowding. Underlying emphysematous changes likely present. No consolidative opacity to suggest pneumonia. No edema or effusion. Nodular density overlying the peripheral left lung base favored to reflect a nipple shadow. No pneumothorax. No acute osseous finding.  Osteopenia noted. IMPRESSION: 1. Low lung volumes with associated bibasilar atelectasis/bronchovascular crowding. 2. No other radiographic evidence for active cardiopulmonary disease. Electronically Signed   By: Jeannine Boga M.D.   On: 10/24/2018 00:58   IMPRESSION AND PLAN:   A/P: 17W w/ PMHx including but not limited to T2IDDM, HTN, HLD, CAD/MI (s/p stents), CVA (non-verbal,  PEG), recurrent/chronic aspiration, Alzheimer's dementia, GERD, chronic pancreatitis, Sz D/O, now p/w respiratory failure necessitating intubation 2/2 suspected aspiration. CT chest (+) "Small right pleural effusion. Consolidation, ground-glass density and mild nodularity within the right greater than left posterior lower lobes, suspicious for pneumonia and or aspiration.". CT head (+) "Small left frontal hemorrhage with both subarachnoid and intraparenchymal components. Mild surrounding edema. Trace dependent blood in the right lateral ventricle." Hyperglycemia (w/ T2IDDM), hypocalcemia, hypoalbuminemia, mild leukocytosis, mild normocytic anemia. -Hx CVA, recurrent/chronic aspiration, SIRS, sepsis, aspiration pneumonia, acute hypoxemic respiratory failure, intubated: Poor mentation, not protecting airway in ED. Intubated. WBC < 12.0, however SIRS (+) on admission. CT chest demonstrates "Small right pleural effusion. Consolidation, ground-glass density and mild nodularity within the right greater than left posterior lower lobes, suspicious for pneumonia and or aspiration." (+) sepsis, aspiration pneumonia. Lactate 1.5. IVF. PCT < 0.10. Nebs, ABx (Levaquin, [place]), suctioning. Admit to ICU, cardiac monitoring, pulse oximetry. VAP precautions. -L frontal subarachnoid/intraparenchymal hemorrhage: CT head (+) "Small left frontal hemorrhage with both subarachnoid and intraparenchymal components. Mild surrounding edema. Trace dependent blood in the right lateral ventricle." Intubated, admitted to ICU. Neurosurgery contacted from ED; pt may need to be transferred based on philosophy of care. Family unreachable thus far. -Hyperglycemia, T2IDDM: SSI. -Hypocalcemia: Ionized calcium. -Hypoalbuminemia: Prealbumin. -Normocytic anemia: Hgb 12.7, stable from recent labwork. No evidence of active/acute blood loss at present time. -c/w other home meds/formulary subs as tolerated. -FEN/GI: NPO for now. -DVT PPx: SCDs, no  pharmacological DVT PPx. -Code status: Full code. -Disposition: Admission, > 2 midnights. -Philosophy of care: Had I the opportunity to speak to the pt's family, I would have liked to have impressed upon them that the pt will only continue to globally deteriorate going forward, with little hope of any significant improvement (neurological or otherwise). He is at the end of life (< 12mo expected life expectancy), and is a hospice candidate.  Addendum: I have been informed that an attempt will be made to transfer pt to [facility].   All the records are reviewed and case discussed with ED provider. Management plans discussed with the patient, family and they are in agreement.  CODE STATUS: Full code.  TOTAL TIME TAKING CARE OF THIS PATIENT: 90 minutes.    Arta Silence M.D on 10/24/2018 at 2:53 AM  Between 7am to 6pm -  Pager - [phone]  After 6pm go to [email or web address] - Proofreader  [facility]  Office  [phone]  CC: Primary care physician; [name], MD   Note: This dictation was prepared with Dragon dictation along with smaller phrase technology. Any transcriptional errors that result from this process are unintentional.

## 2018-10-24 NOTE — ED Notes (Signed)
15 mg etomidate administered by Raquel, RN at 0133 85mg  succinylcholine administered by Raquel, RN at 0134  ET tube inserted at [place] by MD Karma Greaser, 25 at the teeth. Positive color change 0135, bilateral breath sounds verified by respiratory therapist Amedeo Gory

## 2018-10-24 NOTE — Progress Notes (Signed)
CODE SEPSIS - PHARMACY COMMUNICATION  **Broad Spectrum Antibiotics should be administered within 1 hour of Sepsis diagnosis**  Time Code Sepsis Called/Page Received: 0303 0020  Antibiotics Ordered: 0303 0025  Time of 1st antibiotic administration: 0303 0059  Additional action taken by pharmacy:   If necessary, Name of Provider/Nurse Contacted:     Eloise Harman ,PharmD Clinical Pharmacist  10/24/2018  2:26 AM

## 2018-10-24 NOTE — ED Notes (Signed)
MD Forbach at bedside. 

## 2018-10-24 NOTE — ED Notes (Signed)
Respiratory aware of need for arterial blood gas.

## 2018-10-24 NOTE — Progress Notes (Signed)
ETT pulled back cm SECURED AT 23 CM MARK WITH COMMERCIAL TUBE HOLDER,

## 2018-10-24 NOTE — ED Notes (Signed)
Pharmacy informed by this RN that Levaquin is out in pixis. Pharmacy informed this RN that medication would be tubed shortly.

## 2018-10-24 NOTE — ED Notes (Signed)
Respiratory at bedside at this time. Verbal orders for bi-pap placed at this time. Resp at bedside with bipap machine.

## 2018-10-24 NOTE — ED Provider Notes (Addendum)
[facility] Emergency Department Provider Note  ____________________________________________   First MD Initiated Contact with Patient 10/24/18 0012     (approximate)  I have reviewed the triage vital signs and the nursing notes.   HISTORY  Chief Complaint Shortness of Breath  Level 5 caveat:  history/ROS limited by acute/critical illness  HPI Maurice Patterson is a 63 y.o. male with extensive chronic medical issues  including what is a nonverbal status, PEG tube, and what appears to be relatively chronic aspiration pneumonia with occasional episodes of acute on chronic respiratory failure.  He presents by EMS tonight after what is reportedly about 8 hours of difficulty breathing.  Reportedly his wife noticed that he was retracting around 3 PM.  It is unclear what happened in the intervening time but it is possible they were hoping his breathing improved.  When EMS arrived he was on oxygen but was retracting with minimal lung sounds throughout and responsive only to painful stimuli.  Upon arrival in the emergency department he is 100% on nonrebreather but responds only to painful stimuli and will not orient to my voice which apparently, based on the medical records, he is able to do and was able to do on his last ED visit a couple of weeks ago.  The [facility].  The patient did not come with any DNR documentation but currently does not appear to be protecting his airway.  I have ordered BiPAP and we will proceed with a septic work-up given the concern for aspiration although the patient does have a PEG tube and it is unclear if he is taking any oral intake.  Besides being chronically debilitated, he appears acutely ill and his symptoms are severe.         Past Medical History:  Diagnosis Date  . Acute MI ([place])   . Arthritis   . Back pain   . CAD (coronary artery disease)   . CHF (congestive heart failure) ([place])   . Chronic kidney  disease   . Degenerative lumbar disc   . Dementia ([place])   . Diabetes mellitus without complication ([place])   . GERD (gastroesophageal reflux disease)   . Hernia of abdominal cavity   . Hyperlipemia   . Hypertension   . Malignant intraductal papillary mucinous tumor of pancreas ([place])   . Memory loss   . Pancreatitis   . Seizures ([place])    staring spells  . Shingles   . Stroke ([place]) 08/2017  . TIA (transient ischemic attack)     Patient Active Problem List   Diagnosis Date Noted  . Respiratory failure requiring intubation ([place]) 10/24/2018  . Protein-calorie malnutrition, severe 09/20/2018  . Diabetes ([place]) 09/19/2018  . Sepsis ([place]) 09/19/2018  . Pressure injury of skin 08/12/2018  . Aspiration pneumonia ([place])   . Palliative care encounter   . Dysphagia   . Acute respiratory failure with hypoxia ([place])   . Fever   . CAP (community acquired pneumonia) 07/22/2018  . GERD (gastroesophageal reflux disease) 07/22/2018  . Hypersalivation 01/30/2018  . Drooling 01/30/2018  . Alzheimer's dementia ([place]) 05/01/2017  . Cognitive decline 10/20/2016  . Chest pain, rule out acute myocardial infarction 04/05/2016  . Confusion 10/18/2015  . Constipation 08/23/2015  . Uncontrolled type 2 diabetes mellitus with hyperglycemia, with long-term current use of insulin ([place]) 08/23/2015  . Essential hypertension 08/23/2015  . Right lower lobe pneumonia ([place]) 08/23/2015  . Abdominal pain 08/21/2015  . Altered mental status  02/19/2015    Past Surgical History:  Procedure Laterality Date  . CARDIAC CATHETERIZATION    . CORONARY ANGIOPLASTY    . CYSTOSCOPY WITH STENT PLACEMENT Right 05/13/2017   Procedure: CYSTOSCOPY WITH STENT PLACEMENT;  Surgeon: Nickie Retort, MD;  Location: [facility] ORS;  Service: Urology;  Laterality: Right;  . ERCP N/A 09/12/2015   Procedure: ENDOSCOPIC RETROGRADE CHOLANGIOPANCREATOGRAPHY (ERCP);  Surgeon: Hulen Luster, MD;  Location: [facility] ENDOSCOPY;  Service: Gastroenterology;   Laterality: N/A;  . ERCP N/A 01/02/2016   Procedure: ENDOSCOPIC RETROGRADE CHOLANGIOPANCREATOGRAPHY (ERCP);  Surgeon: Hulen Luster, MD;  Location: [facility] ENDOSCOPY;  Service: Gastroenterology;  Laterality: N/A;  . EXTERNAL FIXATION WRIST FRACTURE Left   . left arm metal plate    . LEFT HEART CATH AND CORONARY ANGIOGRAPHY Left 11/04/2016   Procedure: Left Heart Cath and Coronary Angiography;  Surgeon: Isaias Cowman, MD;  Location: [place] CV LAB;  Service: Cardiovascular;  Laterality: Left;  . Left shoulder surgery    . PEG PLACEMENT Left 08/28/2018   Procedure: PERCUTANEOUS ENDOSCOPIC GASTROSTOMY (PEG) PLACEMENT;  Surgeon: Lin Landsman, MD;  Location: [facility] ENDOSCOPY;  Service: Gastroenterology;  Laterality: Left;  . Stent times 3  2013   Cardiac  . TOTAL ELBOW REPLACEMENT Left   . VASECTOMY    . VENTRICULOPERITONEAL SHUNT      Prior to Admission medications   Medication Sig Start Date End Date Taking? Authorizing Provider  acetaminophen (TYLENOL) 500 MG tablet Place 1 tablet (500 mg total) into feeding tube every 6 (six) hours as needed for mild pain. 09/06/18  Yes Sudini, Alveta Heimlich, MD  albuterol (PROVENTIL) (2.5 MG/3ML) 0.083% nebulizer solution Take 3 mLs (2.5 mg total) by nebulization every 3 (three) hours as needed for wheezing or shortness of breath. 09/06/18  Yes Sudini, Alveta Heimlich, MD  ascorbic acid (VITAMIN C) 250 MG tablet Take 250 mg by mouth 2 (two) times daily.   Yes [provider]  aspirin 81 MG chewable tablet Place 1 tablet (81 mg total) into feeding tube daily. 09/07/18  Yes Sudini, Alveta Heimlich, MD  cholecalciferol (D-VI-SOL) 10 MCG/ML LIQD Place 2.5 mLs (1,000 Units total) into feeding tube daily. 09/07/18  Yes Sudini, Alveta Heimlich, MD  clopidogrel (PLAVIX) 75 MG tablet Place 1 tablet (75 mg total) into feeding tube daily. 09/07/18  Yes Sudini, Alveta Heimlich, MD  donepezil (ARICEPT) 10 MG tablet Place 1 tablet (10 mg total) into feeding tube 2 (two) times daily. 09/06/18  Yes Sudini,  Alveta Heimlich, MD  insulin aspart (NOVOLOG) 100 UNIT/ML injection Inject 3 Units into the skin every 6 (six) hours. 09/06/18  Yes Sudini, Alveta Heimlich, MD  Insulin Glargine (LANTUS SOLOSTAR) 100 UNIT/ML Solostar Pen Inject 4 Units into the skin at bedtime. Patient taking differently: Inject 17 Units into the skin at bedtime.  09/29/18  Yes Fritzi Mandes, MD  loperamide (IMODIUM) 2 MG capsule Take 2 mg by mouth as needed for diarrhea or loose stools.   Yes [provider]  memantine (NAMENDA) 10 MG tablet Place 1 tablet (10 mg total) into feeding tube 2 (two) times daily. 09/06/18  Yes Sudini, Alveta Heimlich, MD  metoCLOPramide (REGLAN) 5 MG tablet Place 1 tablet (5 mg total) into feeding tube 4 (four) times daily -  before meals and at bedtime. 09/29/18  Yes Fritzi Mandes, MD  metoprolol tartrate (LOPRESSOR) 25 MG tablet Place 1 tablet (25 mg total) into feeding tube 2 (two) times daily. 09/06/18  Yes Sudini, Alveta Heimlich, MD  Multiple Vitamin (MULTIVITAMIN) LIQD Take 30 mLs by mouth  daily.   Yes [provider]  nystatin (MYCOSTATIN) 100000 UNIT/ML suspension Take 5 mLs (500,000 Units total) by mouth 4 (four) times daily. 10/08/18  Yes Nance Pear, MD  pantoprazole sodium (PROTONIX) 40 mg/20 mL PACK Place 20 mLs (40 mg total) into feeding tube daily. 09/07/18  Yes Sudini, Alveta Heimlich, MD  potassium chloride (KLOR-CON) 20 MEQ packet Place 20 mEq into feeding tube daily. 09/06/18  Yes Sudini, Alveta Heimlich, MD  sodium chloride 0.9 % infusion Inject 500 mLs into the vein daily. Please infuse over 2 hours 10/08/18  Yes Nance Pear, MD  Water For Irrigation, Sterile (FREE WATER) SOLN Place 180 mLs into feeding tube every 4 (four) hours. 09/06/18  Yes Sudini, Alveta Heimlich, MD  Nutritional Supplements (FEEDING SUPPLEMENT, GLUCERNA 1.5 CAL,) LIQD Place 1,000 mLs into feeding tube continuous. 09/06/18   Hillary Bow, MD    Allergies Hydrocodone; Morphine; Ambien [zolpidem]; Brilinta [ticagrelor]; Flexeril [cyclobenzaprine];  Flunitrazepam; Haldol [haloperidol lactate]; Levetiracetam; Lorazepam; Risperdal [risperidone]; Trazodone; Benadryl [diphenhydramine hcl (sleep)]; Penicillins; and Vancomycin  Family History  Problem Relation Age of Onset  . Diabetes Mother   . Hypertension Mother   . CAD Mother   . Hyperlipidemia Mother   . Stroke Mother   . ALS Mother   . Alzheimer's disease Father   . Diabetes Father   . Heart Problems Brother     Social History Social History   Tobacco Use  . Smoking status: Former Smoker    Last attempt to quit: 2013    Years since quitting: 7.1  . Smokeless tobacco: Never Used  . Tobacco comment: used to smoke 2PD for 40 yrs, quit about 4 years ago  Substance Use Topics  . Alcohol use: No    Alcohol/week: 0.0 standard drinks    Comment: occasional  . Drug use: No    Review of Systems Level 5 caveat:  history/ROS limited by acute/critical illness ____________________________________________   PHYSICAL EXAM:  VITAL SIGNS: ED Triage Vitals  Enc Vitals Group     BP 10/24/18 0030 (!) 138/97     Pulse Rate 10/24/18 0017 (!) 105     Resp 10/24/18 0017 (!) 28     Temp 10/24/18 0118 99.5 F (37.5 C)     Temp Source 10/24/18 0118 Other     SpO2 10/24/18 0017 100 %     Weight 10/24/18 0014 56.7 kg (125 lb)     Height 10/24/18 0014 1.727 m (5\' 8" )     Head Circumference --      [facility] --      Pain Score --      Pain Loc --      Pain Edu? --      Excl. in [place]? --     Constitutional: Obtunded, withdraws from pain but otherwise is unresponsive. Eyes: Conjunctivae are normal. PERRL. EOMI. Head: Atraumatic. Nose: No congestion/rhinnorhea. Mouth/Throat: Mucous membranes are dry.  There is some green secretions in his airway visible during intubation. Neck: No stridor.  No meningeal signs.   Cardiovascular: Sinus tachycardia, regular rhythm. Good peripheral circulation. Grossly normal heart sounds. Respiratory: Increased respiratory effort with accessory muscle  usage and intercostal retractions.  He will have episodes of a few seconds of apnea but then will make a gasping noise and start breathing again.  He has "guppy respirations" with his mouth wide open.  Minimal air movement throughout the lungs but particularly decreased on the left.  No audible wheezing but likely this is secondary to minimal air movement. Gastrointestinal:  Cachectic.  Soft and nontender. No distention.  PEG tube in place. Musculoskeletal: No lower extremity tenderness nor edema. No gross deformities of extremities. Neurologic: Unable to participate in neurological exam.  He will withdraw in general from pain and seems to be moving all 4 extremities but only minimally so and his withdrawal is not indicative of localization. GCS is somewhere between 8-9. Skin:  Skin is pale and warm.   ____________________________________________   LABS (all labs ordered are listed, but only abnormal results are displayed)  Labs Reviewed  COMPREHENSIVE METABOLIC PANEL - Abnormal; Notable for the following components:      Result Value   Glucose, Bld 453 (*)    Creatinine, Ser 0.51 (*)    Calcium 8.3 (*)    Albumin 3.0 (*)    AST 12 (*)    Alkaline Phosphatase 136 (*)    All other components within normal limits  CBC WITH DIFFERENTIAL/PLATELET - Abnormal; Notable for the following components:   WBC 11.6 (*)    Hemoglobin 12.7 (*)    Neutro Abs 8.8 (*)    Monocytes Absolute 1.4 (*)    All other components within normal limits  URINALYSIS, COMPLETE (UACMP) WITH MICROSCOPIC - Abnormal; Notable for the following components:   Color, Urine YELLOW (*)    APPearance CLEAR (*)    Specific Gravity, Urine 1.035 (*)    Glucose, UA >=500 (*)    Ketones, ur 5 (*)    All other components within normal limits  BLOOD GAS, ARTERIAL - Abnormal; Notable for the following components:   pO2, Arterial 79 (*)    Acid-Base Excess 2.4 (*)    All other components within normal limits  CULTURE, BLOOD  (ROUTINE X 2)  CULTURE, BLOOD (ROUTINE X 2)  URINE CULTURE  EXPECTORATED SPUTUM ASSESSMENT W REFEX TO RESP CULTURE  GRAM STAIN  MRSA PCR SCREENING  LACTIC ACID, PLASMA  LIPASE, BLOOD  BRAIN NATRIURETIC PEPTIDE  PROCALCITONIN  PROTIME-INR  TROPONIN I  LACTIC ACID, PLASMA  MAGNESIUM  PHOSPHORUS  CALCIUM, IONIZED  PREALBUMIN  TSH  VITAMIN B12  FOLATE  RPR  AMMONIA  HEMOGLOBIN A1C  RAPID HIV SCREEN (HIV 1/2 AB+AG)  STREP PNEUMONIAE URINARY ANTIGEN  LEGIONELLA PNEUMOPHILA SEROGP 1 UR AG  INFLUENZA PANEL BY PCR (TYPE A & B)   ____________________________________________  EKG  ED ECG REPORT I, Hinda Kehr, the attending physician, personally viewed and interpreted this ECG.  Date: 10/24/2018 EKG Time: 2:02 AM Rate: 74 Rhythm: normal sinus rhythm QRS Axis: normal Intervals: normal ST/T Wave abnormalities: normal Narrative Interpretation: no evidence of acute ischemia  ____________________________________________  RADIOLOGY I, Hinda Kehr, personally viewed and evaluated these images (plain radiographs) as part of my medical decision making, as well as reviewing the written report by the radiologist.  ED MD interpretation: Nonspecific changes on initial chest x-ray, no lobar pneumonia.  Repeat chest x-ray after tube placement shows the ET tube is very close to the right mainstem bronchus, RT was asked to retract the tube by 2 cm.  CT scans demonstrate probable pneumonia plus or minus aspiration on chest and a bleed in the left frontal lobe.  Official radiology report(s): Ct Head Wo Contrast  Result Date: 10/24/2018 CLINICAL DATA:  Altered level of consciousness. EXAM: CT HEAD WITHOUT CONTRAST TECHNIQUE: Contiguous axial images were obtained from the base of the skull through the vertex without intravenous contrast. COMPARISON:  Head CT 09/19/2018, brain MRI 07/28/2018 FINDINGS: Brain: Left frontal hemorrhage has both subarachnoid and intraparenchymal component,  measuring  approximately 1.4 x 1.3 x 1.3 cm (volume = 1.2 cm^3). Mild surrounding edema. Trace dependent blood in the right lateral ventricle. Again seen age advanced atrophy. Unchanged chronic small vessel ischemia. No evidence of acute ischemia. Basilar cisterns are patent. Vascular: Skull base atherosclerosis without hyperdense vessel. Skull: No fracture or focal lesion. Sinuses/Orbits: Paranasal sinuses and mastoid air cells are clear. The visualized orbits are unremarkable. Endotracheal tube partially included. Other: None. IMPRESSION: 1. Small left frontal hemorrhage with both subarachnoid and intraparenchymal components. Mild surrounding edema. Trace dependent blood in the right lateral ventricle. 2. Unchanged age advanced atrophy and chronic small vessel ischemia. Critical Value/emergent results were called by telephone at the time of interpretation on 10/24/2018 at 2:43 am to Dr. Hinda Kehr , who verbally acknowledged these results. Electronically Signed   By: Keith Rake M.D.   On: 10/24/2018 02:43   Ct Chest Wo Contrast  Result Date: 10/24/2018 CLINICAL DATA:  Acute respiratory distress, unresponsive EXAM: CT CHEST WITHOUT CONTRAST TECHNIQUE: Multidetector CT imaging of the chest was performed following the standard protocol without IV contrast. COMPARISON:  Radiograph 10/24/2018 FINDINGS: Cardiovascular: Limited evaluation without intravenous contrast. Moderate aortic atherosclerosis. No aneurysm. Coronary vascular calcification. Normal heart size. No pericardial effusion Mediastinum/Nodes: Midline trachea. Endotracheal tube tip terminates slightly above the carina. Prominent mediastinal lymph nodes. Right low paratracheal lymph node measures 13 mm. Subcarinal lymph node measures 13 mm. Esophagus within normal limits. Lungs/Pleura: Mild subpleural nodularity and reticulations suggesting mild fibrosis. Trace right pleural effusion. Consolidation, ground-glass density and mild nodularity within the right  greater than left posterior lower lobes. Upper Abdomen: No acute abnormality. Partially visualized balloon in the stomach. Musculoskeletal: Degenerative changes of the spine. No acute or suspicious abnormality. IMPRESSION: 1. Small right pleural effusion. Consolidation, ground-glass density and mild nodularity within the right greater than left posterior lower lobes, suspicious for pneumonia and or aspiration. 2. Mild mediastinal adenopathy, likely reactive Aortic Atherosclerosis (ICD10-I70.0). Electronically Signed   By: Donavan Foil M.D.   On: 10/24/2018 02:45   Dg Chest Portable 1 View  Result Date: 10/24/2018 CLINICAL DATA:  Tube placement EXAM: PORTABLE CHEST 1 VIEW COMPARISON:  10/24/2018, 10/07/2018, 09/28/2018, 08/08/2018, 04/04/2016 FINDINGS: Placement of endotracheal tube, the tip is about 8 mm superior to the carina. No interval consolidation or development of pleural effusion. Coarse interstitial opacity at the bases consistent with chronic change. Stable cardiomediastinal silhouette. No pneumothorax. IMPRESSION: 1. Endotracheal tube tip about 8 mm superior to carina. 2. No change in coarse basilar opacities, likely scarring Electronically Signed   By: Donavan Foil M.D.   On: 10/24/2018 01:59   Dg Chest Port 1 View  Result Date: 10/24/2018 CLINICAL DATA:  Initial evaluation for acute sepsis. EXAM: PORTABLE CHEST 1 VIEW COMPARISON:  Prior radiograph from 10/07/2018. FINDINGS: Cardiac and mediastinal silhouettes are stable in size and contour, and remain within normal limits. Coronary stent noted. Aortic atherosclerosis noted as well. Lungs hypoinflated. Secondary bibasilar atelectasis and/or bronchovascular crowding. Underlying emphysematous changes likely present. No consolidative opacity to suggest pneumonia. No edema or effusion. Nodular density overlying the peripheral left lung base favored to reflect a nipple shadow. No pneumothorax. No acute osseous finding.  Osteopenia noted. IMPRESSION:  1. Low lung volumes with associated bibasilar atelectasis/bronchovascular crowding. 2. No other radiographic evidence for active cardiopulmonary disease. Electronically Signed   By: Jeannine Boga M.D.   On: 10/24/2018 00:58    ____________________________________________   PROCEDURES   Procedure(s) performed (including Critical Care):  Procedure Name: Intubation Date/Time:  10/24/2018 2:15 AM Performed by: Hinda Kehr, MD Pre-anesthesia Checklist: Patient identified, Emergency Drugs available, Suction available and Patient being monitored Oxygen Delivery Method: Non-rebreather mask Preoxygenation: Pre-oxygenation with 100% oxygen Induction Type: IV induction and Rapid sequence Laryngoscope Size: Glidescope and 3 Tube size: 7.5 mm Number of attempts: 1 Placement Confirmation: ETT inserted through vocal cords under direct vision,  CO2 detector and Breath sounds checked- equal and bilateral Secured at: 26 cm Tube secured with: ETT holder Dental Injury: Teeth and Oropharynx as per pre-operative assessment     .Critical Care Performed by: Hinda Kehr, MD Authorized by: Hinda Kehr, MD   Critical care provider statement:    Critical care time (minutes):  60   Critical care time was exclusive of:  Separately billable procedures and treating other patients   Critical care was necessary to treat or prevent imminent or life-threatening deterioration of the following conditions:  Sepsis, respiratory failure and CNS failure or compromise   Critical care was time spent personally by me on the following activities:  Development of treatment plan with patient or surrogate, discussions with consultants, evaluation of patient's response to treatment, examination of patient, obtaining history from patient or surrogate, ordering and performing treatments and interventions, ordering and review of laboratory studies, ordering and review of radiographic studies, pulse oximetry, re-evaluation  of patient's condition and review of old charts     ____________________________________________   INITIAL IMPRESSION / MDM / [place] / ED COURSE  As part of my medical decision making, I reviewed the following data within the [place] notes reviewed and incorporated, Labs reviewed , EKG interpreted , Old chart reviewed, Radiograph reviewed , Discussed with admitting physician  and Notes from prior ED visits        Differential diagnosis includes, but is not limited to, sepsis, aspiration or healthcare associated pneumonia, nonspecific acute on chronic respiratory failure, CVA, intracranial bleed, ACS, urinary tract infection.  The patient is very ill-appearing at this time.  I reviewed his electronic medical record as well as the documentation sent from [facility] and all of the records seem to indicate that he is full code.  I will try to contact family; the same emergency contact and healthcare power of attorney listed in the documentation for [facility] is the one listed and [facility].  I have ordered a full sepsis work-up including 30 mL/kg of IV fluids, all the standard labs and work-up, and empiric Levaquin 750 mg IV and clindamycin 600 mg IV; he needs to be treated for both healthcare associated pneumonia and aspiration pneumonia, but he has allergies listed to both penicillins and vancomycin.  I think this is a reasonable alternative regimen with which to start.  Clinical Course as of Oct 23 504  Tue Oct 24, 2018  0026 I have called twice to the emergency contact number for the patient Ayansh Feutz) to try and confirm CODE STATUS but I have not been able to get through.  I left a HIPAA compliant voicemail for her to call the next available opportunity.  The patient is on BiPAP currently and I am proceeding with sepsis work-up.  I want to see his chest x-ray before I proceed with intubation given that I am hearing no breath sounds on the left but  all his medical records seem to indicate that he is full code.   [CF]  0138 After multiple calls I was unable to get in touch with the patient's healthcare power of attorney/spouse.  I reviewed his medical record extensively and saw that he was full code in every instance it was mentioned and that his spouse refused a palliative care consult last time.The patient is still retracting, moving minimal air even with the BiPAP, and is minimally responsive to pain.  He is not protecting his airway and I intubated him using RSI without complication.  He is currently on a Precedex drip.  Now that his airway is protected I am going to send him for a CT scan of his head and chest given his apparent change in mental status and the high probability that he does have some infection in the lungs not seen on chest x-ray.   [CF]  0142 Comprehensive metabolic panel is generally reassuring with hyperglycemia and an appropriate creatinine based on his cachectic body habitus.  Mild leukocytosis of 11.6, otherwise CBC is unremarkable.  Lactic acid is 1.5, procalcitonin is pending.  Blood cultures and urine cultures are pending.  Urinalysis was unremarkable.   [CF]  4098 Given his precipitous decline from his already poor baseline, with no obvious sign of severe infection on chest x-ray, I have ordered a CT head without contrast and a CT chest without contrast.   [CF]  0153 I evaluated the tube placement chest x-ray and the tube is a little bit too deep, right at the carina and aiming down the right bronchus.  I called respiratory therapy and asked him to back off the ET tube by 2 cm.   [CF]  0155 Patient's temp foley indicated core temperature of 100 degrees F   [CF]  0213 Procalcitonin: <0.10 [CF]  0238 I reviewed the CT scans myself.  There does not appear to be any acute abnormality on the head CT.  The chest CT shows what I believe is likely aspiration pneumonia.   [CF]  0239 I have discussed the case in detail by  phone with Dr. Jodell Cipro.  He will admit the patient to the ICU.   [CF]  [phone] The radiologist called and pointed out that the patient has a bleed in the left frontal lobe.  This is acute and new since prior.    Discussed case by phone with Dr. Lacinda Axon with neurosurgery.  He pointed out that while intervention would not necessarily be recommended given the patient's co-morbidities, since he is technically full code, he should be transferred given that he could theoretically be a candidate for EVD.  Charge RN also tried reaching patient's daughter by phone but there was no answer.  Still no answer on wife's phone.   [CF]  [place] to discuss, awaiting callback from neurosurgeon.   [CF]  [id number] Patient is hypotensive.  Getting another 1L bolus, reduced Precedex to 0.5 mcg/kg/hr, now backing down again to 0.3 mcg/kg/hr.  Fentanyl infusion was originally at 100 mcg/hr, reduced it to 50 mcg/hr.   [CF]  0307 On hold with [facility],.   [CF]  [phone] Spoke by phone with Dr. Devona Konig with [facility].  They have accepted the patient and have a bed available.  On hold to talk to [facility].   [CF]  [facility] is working on [facility].   [CF]  [phone] [facility] is here for ground transport.   [CF]  0501 Transitioning patient from Precedex to propofol as per [facility] preference.   [CF]    Clinical Course User Index [CF] Hinda Kehr, MD    ____________________________________________  FINAL CLINICAL IMPRESSION(S) / ED DIAGNOSES  Final  diagnoses:  Acute on chronic respiratory failure, unspecified whether with hypoxia or hypercapnia (HCC)  HCAP (healthcare-associated pneumonia)  Aspiration pneumonia, unspecified aspiration pneumonia type, unspecified laterality, unspecified part of lung ([place])  Sepsis, due to unspecified organism, unspecified whether acute organ dysfunction present ([place])  Intracranial bleeding ([place])     MEDICATIONS GIVEN DURING THIS  VISIT:  Medications  dexmedetomidine (PRECEDEX) 400 MCG/100ML (4 mcg/mL) infusion (0 mcg/kg/hr  56.7 kg Intravenous Stopped 10/24/18 0505)  fentaNYL 2585mcg in NS 226mL (89mcg/ml) infusion-PREMIX (50 mcg/hr Intravenous Rate/Dose Change 10/24/18 0156)  ipratropium-albuterol (DUONEB) 0.5-2.5 (3) MG/3ML nebulizer solution 3 mL (has no administration in time range)  clindamycin (CLEOCIN) IVPB 600 mg (has no administration in time range)  levofloxacin (LEVAQUIN) IVPB 750 mg (has no administration in time range)  propofol (DIPRIVAN) 1000 MG/100ML infusion (5 mcg/kg/min  56.7 kg Intravenous New Bag/Given 10/24/18 0504)  sodium chloride 0.9 % bolus 1,000 mL (0 mLs Intravenous Stopped 10/24/18 0155)    And  sodium chloride 0.9 % bolus 500 mL (0 mLs Intravenous Stopped 10/24/18 0239)    And  sodium chloride 0.9 % bolus 250 mL (0 mLs Intravenous Stopped 10/24/18 0239)  clindamycin (CLEOCIN) IVPB 600 mg (0 mg Intravenous Stopped 10/24/18 0120)  levofloxacin (LEVAQUIN) IVPB 750 mg (0 mg Intravenous Stopped 10/24/18 0239)  ipratropium-albuterol (DUONEB) 0.5-2.5 (3) MG/3ML nebulizer solution 3 mL (3 mLs Nebulization Given 10/24/18 0307)  etomidate (AMIDATE) injection 15 mg (15 mg Intravenous Given 10/24/18 0133)  succinylcholine (ANECTINE) injection 85 mg (85 mg Intravenous Given 10/24/18 0134)     ED Discharge Orders    None       Note:  This document was prepared using Dragon voice recognition software and may include unintentional dictation errors.   Hinda Kehr, MD 10/24/18 7867    Hinda Kehr, MD 10/24/18 [phone]

## 2018-10-25 LAB — URINE CULTURE: Culture: <10000 — AB

## 2018-10-29 LAB — CULTURE, BLOOD (ROUTINE X 2)
Culture: NO GROWTH
Culture: NO GROWTH

## 2018-11-21 DIAGNOSIS — I619 Nontraumatic intracerebral hemorrhage, unspecified: Secondary | ICD-10-CM | POA: Diagnosis not present

## 2018-11-21 DIAGNOSIS — Z978 Presence of other specified devices: Secondary | ICD-10-CM | POA: Diagnosis not present

## 2018-11-21 DIAGNOSIS — R05 Cough: Secondary | ICD-10-CM | POA: Diagnosis not present

## 2018-11-21 DIAGNOSIS — K9423 Gastrostomy malfunction: Secondary | ICD-10-CM | POA: Diagnosis not present

## 2018-11-21 DIAGNOSIS — S06366D Traumatic hemorrhage of cerebrum, unspecified, with loss of consciousness greater than 24 hours without return to pre-existing conscious level with patient surviving, subsequent encounter: Secondary | ICD-10-CM | POA: Diagnosis not present

## 2018-11-21 DIAGNOSIS — G9349 Other encephalopathy: Secondary | ICD-10-CM | POA: Diagnosis not present

## 2018-11-21 DIAGNOSIS — I69391 Dysphagia following cerebral infarction: Secondary | ICD-10-CM | POA: Diagnosis not present

## 2018-11-21 DIAGNOSIS — R404 Transient alteration of awareness: Secondary | ICD-10-CM | POA: Diagnosis not present

## 2018-11-21 DIAGNOSIS — R402312 Coma scale, best motor response, none, at arrival to emergency department: Secondary | ICD-10-CM | POA: Diagnosis not present

## 2018-11-21 DIAGNOSIS — G3109 Other frontotemporal dementia: Secondary | ICD-10-CM | POA: Diagnosis not present

## 2018-11-21 DIAGNOSIS — R4020 Unspecified coma: Secondary | ICD-10-CM | POA: Diagnosis not present

## 2018-11-21 DIAGNOSIS — I629 Nontraumatic intracranial hemorrhage, unspecified: Secondary | ICD-10-CM | POA: Diagnosis not present

## 2018-11-21 DIAGNOSIS — E1140 Type 2 diabetes mellitus with diabetic neuropathy, unspecified: Secondary | ICD-10-CM | POA: Diagnosis not present

## 2018-11-21 DIAGNOSIS — A419 Sepsis, unspecified organism: Secondary | ICD-10-CM | POA: Diagnosis not present

## 2018-11-21 DIAGNOSIS — I2510 Atherosclerotic heart disease of native coronary artery without angina pectoris: Secondary | ICD-10-CM | POA: Diagnosis not present

## 2018-11-21 DIAGNOSIS — F0390 Unspecified dementia without behavioral disturbance: Secondary | ICD-10-CM | POA: Diagnosis not present

## 2018-11-21 DIAGNOSIS — R739 Hyperglycemia, unspecified: Secondary | ICD-10-CM | POA: Diagnosis not present

## 2018-11-21 DIAGNOSIS — R509 Fever, unspecified: Secondary | ICD-10-CM | POA: Diagnosis not present

## 2018-11-21 DIAGNOSIS — E785 Hyperlipidemia, unspecified: Secondary | ICD-10-CM | POA: Diagnosis not present

## 2018-11-21 DIAGNOSIS — Z515 Encounter for palliative care: Secondary | ICD-10-CM | POA: Diagnosis not present

## 2018-11-21 DIAGNOSIS — E43 Unspecified severe protein-calorie malnutrition: Secondary | ICD-10-CM | POA: Diagnosis not present

## 2018-11-21 DIAGNOSIS — F39 Unspecified mood [affective] disorder: Secondary | ICD-10-CM | POA: Diagnosis not present

## 2018-11-21 DIAGNOSIS — E7849 Other hyperlipidemia: Secondary | ICD-10-CM | POA: Diagnosis not present

## 2018-11-21 DIAGNOSIS — I469 Cardiac arrest, cause unspecified: Secondary | ICD-10-CM | POA: Diagnosis not present

## 2018-11-21 DIAGNOSIS — R6521 Severe sepsis with septic shock: Secondary | ICD-10-CM | POA: Diagnosis not present

## 2018-11-21 DIAGNOSIS — I252 Old myocardial infarction: Secondary | ICD-10-CM | POA: Diagnosis not present

## 2018-11-21 DIAGNOSIS — S06350A Traumatic hemorrhage of left cerebrum without loss of consciousness, initial encounter: Secondary | ICD-10-CM | POA: Diagnosis not present

## 2018-11-21 DIAGNOSIS — I69128 Other speech and language deficits following nontraumatic intracerebral hemorrhage: Secondary | ICD-10-CM | POA: Diagnosis not present

## 2018-11-21 DIAGNOSIS — Z466 Encounter for fitting and adjustment of urinary device: Secondary | ICD-10-CM | POA: Diagnosis not present

## 2018-11-21 DIAGNOSIS — B020 Zoster encephalitis: Secondary | ICD-10-CM | POA: Diagnosis not present

## 2018-11-21 DIAGNOSIS — Z8249 Family history of ischemic heart disease and other diseases of the circulatory system: Secondary | ICD-10-CM | POA: Diagnosis not present

## 2018-11-21 DIAGNOSIS — R5383 Other fatigue: Secondary | ICD-10-CM | POA: Diagnosis not present

## 2018-11-21 DIAGNOSIS — I219 Acute myocardial infarction, unspecified: Secondary | ICD-10-CM | POA: Diagnosis not present

## 2018-11-21 DIAGNOSIS — M6281 Muscle weakness (generalized): Secondary | ICD-10-CM | POA: Diagnosis not present

## 2018-11-21 DIAGNOSIS — L89154 Pressure ulcer of sacral region, stage 4: Secondary | ICD-10-CM | POA: Diagnosis not present

## 2018-11-21 DIAGNOSIS — G308 Other Alzheimer's disease: Secondary | ICD-10-CM | POA: Diagnosis not present

## 2018-11-21 DIAGNOSIS — G4709 Other insomnia: Secondary | ICD-10-CM | POA: Diagnosis not present

## 2018-11-21 DIAGNOSIS — I509 Heart failure, unspecified: Secondary | ICD-10-CM | POA: Diagnosis not present

## 2018-11-21 DIAGNOSIS — Z20828 Contact with and (suspected) exposure to other viral communicable diseases: Secondary | ICD-10-CM | POA: Diagnosis not present

## 2018-11-21 DIAGNOSIS — G9340 Encephalopathy, unspecified: Secondary | ICD-10-CM | POA: Diagnosis not present

## 2018-11-21 DIAGNOSIS — I130 Hypertensive heart and chronic kidney disease with heart failure and stage 1 through stage 4 chronic kidney disease, or unspecified chronic kidney disease: Secondary | ICD-10-CM | POA: Diagnosis not present

## 2018-11-21 DIAGNOSIS — E1142 Type 2 diabetes mellitus with diabetic polyneuropathy: Secondary | ICD-10-CM | POA: Diagnosis not present

## 2018-11-21 DIAGNOSIS — E119 Type 2 diabetes mellitus without complications: Secondary | ICD-10-CM | POA: Diagnosis not present

## 2018-11-21 DIAGNOSIS — Z7189 Other specified counseling: Secondary | ICD-10-CM | POA: Diagnosis not present

## 2018-11-21 DIAGNOSIS — N189 Chronic kidney disease, unspecified: Secondary | ICD-10-CM | POA: Diagnosis not present

## 2018-11-21 DIAGNOSIS — J9601 Acute respiratory failure with hypoxia: Secondary | ICD-10-CM | POA: Diagnosis not present

## 2018-11-21 DIAGNOSIS — E870 Hyperosmolality and hypernatremia: Secondary | ICD-10-CM | POA: Diagnosis not present

## 2018-11-21 DIAGNOSIS — R61 Generalized hyperhidrosis: Secondary | ICD-10-CM | POA: Diagnosis not present

## 2018-11-21 DIAGNOSIS — R2689 Other abnormalities of gait and mobility: Secondary | ICD-10-CM | POA: Diagnosis not present

## 2018-11-21 DIAGNOSIS — E1122 Type 2 diabetes mellitus with diabetic chronic kidney disease: Secondary | ICD-10-CM | POA: Diagnosis not present

## 2018-11-21 DIAGNOSIS — J189 Pneumonia, unspecified organism: Secondary | ICD-10-CM | POA: Diagnosis not present

## 2018-11-21 DIAGNOSIS — I9589 Other hypotension: Secondary | ICD-10-CM | POA: Diagnosis not present

## 2018-11-21 DIAGNOSIS — E1165 Type 2 diabetes mellitus with hyperglycemia: Secondary | ICD-10-CM | POA: Diagnosis not present

## 2018-11-21 DIAGNOSIS — N183 Chronic kidney disease, stage 3 (moderate): Secondary | ICD-10-CM | POA: Diagnosis not present

## 2018-11-21 DIAGNOSIS — L89159 Pressure ulcer of sacral region, unspecified stage: Secondary | ICD-10-CM | POA: Diagnosis not present

## 2018-11-21 DIAGNOSIS — Y95 Nosocomial condition: Secondary | ICD-10-CM | POA: Diagnosis present

## 2018-11-21 DIAGNOSIS — E440 Moderate protein-calorie malnutrition: Secondary | ICD-10-CM | POA: Diagnosis not present

## 2018-11-21 DIAGNOSIS — E872 Acidosis: Secondary | ICD-10-CM | POA: Diagnosis not present

## 2018-11-21 DIAGNOSIS — K219 Gastro-esophageal reflux disease without esophagitis: Secondary | ICD-10-CM | POA: Diagnosis not present

## 2018-11-21 DIAGNOSIS — G4089 Other seizures: Secondary | ICD-10-CM | POA: Diagnosis not present

## 2018-11-21 DIAGNOSIS — R4182 Altered mental status, unspecified: Secondary | ICD-10-CM | POA: Diagnosis not present

## 2018-11-21 DIAGNOSIS — Z931 Gastrostomy status: Secondary | ICD-10-CM | POA: Diagnosis not present

## 2018-11-21 DIAGNOSIS — I5032 Chronic diastolic (congestive) heart failure: Secondary | ICD-10-CM | POA: Diagnosis not present

## 2018-11-21 DIAGNOSIS — J690 Pneumonitis due to inhalation of food and vomit: Secondary | ICD-10-CM | POA: Diagnosis not present

## 2018-11-21 DIAGNOSIS — R069 Unspecified abnormalities of breathing: Secondary | ICD-10-CM | POA: Diagnosis not present

## 2018-11-21 DIAGNOSIS — R918 Other nonspecific abnormal finding of lung field: Secondary | ICD-10-CM | POA: Diagnosis not present

## 2018-11-21 DIAGNOSIS — Z9181 History of falling: Secondary | ICD-10-CM | POA: Diagnosis not present

## 2018-11-21 DIAGNOSIS — R402112 Coma scale, eyes open, never, at arrival to emergency department: Secondary | ICD-10-CM | POA: Diagnosis not present

## 2018-11-21 DIAGNOSIS — I639 Cerebral infarction, unspecified: Secondary | ICD-10-CM | POA: Diagnosis not present

## 2018-11-21 DIAGNOSIS — Z9911 Dependence on respirator [ventilator] status: Secondary | ICD-10-CM | POA: Diagnosis not present

## 2018-11-21 DIAGNOSIS — R402212 Coma scale, best verbal response, none, at arrival to emergency department: Secondary | ICD-10-CM | POA: Diagnosis not present

## 2018-11-21 DIAGNOSIS — I10 Essential (primary) hypertension: Secondary | ICD-10-CM | POA: Diagnosis not present

## 2018-11-21 DIAGNOSIS — L89153 Pressure ulcer of sacral region, stage 3: Secondary | ICD-10-CM | POA: Diagnosis not present

## 2018-11-21 DIAGNOSIS — Z681 Body mass index (BMI) 19 or less, adult: Secondary | ICD-10-CM | POA: Diagnosis not present

## 2018-11-21 DIAGNOSIS — R627 Adult failure to thrive: Secondary | ICD-10-CM | POA: Diagnosis not present

## 2018-11-21 DIAGNOSIS — R6520 Severe sepsis without septic shock: Secondary | ICD-10-CM | POA: Diagnosis not present

## 2018-11-24 ENCOUNTER — Emergency Department: Payer: PPO

## 2018-11-24 ENCOUNTER — Inpatient Hospital Stay
Admission: EM | Admit: 2018-11-24 | Discharge: 2018-12-09 | DRG: 871 | Disposition: A | Discharge status: Home Health | Payer: PPO | Attending: Specialist | Admitting: Specialist

## 2018-11-24 ENCOUNTER — Other Ambulatory Visit: Payer: Self-pay

## 2018-11-24 DIAGNOSIS — R509 Fever, unspecified: Secondary | ICD-10-CM

## 2018-11-24 DIAGNOSIS — R069 Unspecified abnormalities of breathing: Secondary | ICD-10-CM | POA: Diagnosis not present

## 2018-11-24 DIAGNOSIS — L89150 Pressure ulcer of sacral region, unstageable: Secondary | ICD-10-CM | POA: Diagnosis not present

## 2018-11-24 DIAGNOSIS — I252 Old myocardial infarction: Secondary | ICD-10-CM

## 2018-11-24 DIAGNOSIS — Z434 Encounter for attention to other artificial openings of digestive tract: Secondary | ICD-10-CM | POA: Diagnosis not present

## 2018-11-24 DIAGNOSIS — Z8619 Personal history of other infectious and parasitic diseases: Secondary | ICD-10-CM

## 2018-11-24 DIAGNOSIS — J9690 Respiratory failure, unspecified, unspecified whether with hypoxia or hypercapnia: Secondary | ICD-10-CM | POA: Diagnosis not present

## 2018-11-24 DIAGNOSIS — E870 Hyperosmolality and hypernatremia: Secondary | ICD-10-CM | POA: Diagnosis present

## 2018-11-24 DIAGNOSIS — Z7189 Other specified counseling: Secondary | ICD-10-CM | POA: Diagnosis not present

## 2018-11-24 DIAGNOSIS — I69128 Other speech and language deficits following nontraumatic intracerebral hemorrhage: Secondary | ICD-10-CM

## 2018-11-24 DIAGNOSIS — M519 Unspecified thoracic, thoracolumbar and lumbosacral intervertebral disc disorder: Secondary | ICD-10-CM | POA: Diagnosis not present

## 2018-11-24 DIAGNOSIS — F0390 Unspecified dementia without behavioral disturbance: Secondary | ICD-10-CM

## 2018-11-24 DIAGNOSIS — I152 Hypertension secondary to endocrine disorders: Secondary | ICD-10-CM | POA: Diagnosis not present

## 2018-11-24 DIAGNOSIS — Z823 Family history of stroke: Secondary | ICD-10-CM

## 2018-11-24 DIAGNOSIS — K219 Gastro-esophageal reflux disease without esophagitis: Secondary | ICD-10-CM | POA: Diagnosis not present

## 2018-11-24 DIAGNOSIS — E878 Other disorders of electrolyte and fluid balance, not elsewhere classified: Secondary | ICD-10-CM | POA: Diagnosis present

## 2018-11-24 DIAGNOSIS — N189 Chronic kidney disease, unspecified: Secondary | ICD-10-CM | POA: Diagnosis not present

## 2018-11-24 DIAGNOSIS — E119 Type 2 diabetes mellitus without complications: Secondary | ICD-10-CM | POA: Diagnosis not present

## 2018-11-24 DIAGNOSIS — J9600 Acute respiratory failure, unspecified whether with hypoxia or hypercapnia: Secondary | ICD-10-CM | POA: Diagnosis present

## 2018-11-24 DIAGNOSIS — A419 Sepsis, unspecified organism: Secondary | ICD-10-CM

## 2018-11-24 DIAGNOSIS — Z9911 Dependence on respirator [ventilator] status: Secondary | ICD-10-CM | POA: Diagnosis not present

## 2018-11-24 DIAGNOSIS — Z7902 Long term (current) use of antithrombotics/antiplatelets: Secondary | ICD-10-CM | POA: Diagnosis not present

## 2018-11-24 DIAGNOSIS — R402112 Coma scale, eyes open, never, at arrival to emergency department: Secondary | ICD-10-CM | POA: Diagnosis not present

## 2018-11-24 DIAGNOSIS — R05 Cough: Secondary | ICD-10-CM | POA: Diagnosis not present

## 2018-11-24 DIAGNOSIS — R61 Generalized hyperhidrosis: Secondary | ICD-10-CM | POA: Diagnosis not present

## 2018-11-24 DIAGNOSIS — Z03818 Encounter for observation for suspected exposure to other biological agents ruled out: Secondary | ICD-10-CM

## 2018-11-24 DIAGNOSIS — Z881 Allergy status to other antibiotic agents status: Secondary | ICD-10-CM

## 2018-11-24 DIAGNOSIS — L89153 Pressure ulcer of sacral region, stage 3: Secondary | ICD-10-CM | POA: Diagnosis present

## 2018-11-24 DIAGNOSIS — R6521 Severe sepsis with septic shock: Secondary | ICD-10-CM | POA: Diagnosis not present

## 2018-11-24 DIAGNOSIS — Z96622 Presence of left artificial elbow joint: Secondary | ICD-10-CM | POA: Diagnosis present

## 2018-11-24 DIAGNOSIS — E039 Hypothyroidism, unspecified: Secondary | ICD-10-CM | POA: Diagnosis not present

## 2018-11-24 DIAGNOSIS — R739 Hyperglycemia, unspecified: Secondary | ICD-10-CM | POA: Diagnosis not present

## 2018-11-24 DIAGNOSIS — Z880 Allergy status to penicillin: Secondary | ICD-10-CM

## 2018-11-24 DIAGNOSIS — I5032 Chronic diastolic (congestive) heart failure: Secondary | ICD-10-CM | POA: Diagnosis not present

## 2018-11-24 DIAGNOSIS — E1122 Type 2 diabetes mellitus with diabetic chronic kidney disease: Secondary | ICD-10-CM | POA: Diagnosis not present

## 2018-11-24 DIAGNOSIS — E1142 Type 2 diabetes mellitus with diabetic polyneuropathy: Secondary | ICD-10-CM | POA: Diagnosis not present

## 2018-11-24 DIAGNOSIS — Z820 Family history of epilepsy and other diseases of the nervous system: Secondary | ICD-10-CM

## 2018-11-24 DIAGNOSIS — I509 Heart failure, unspecified: Secondary | ICD-10-CM

## 2018-11-24 DIAGNOSIS — M2550 Pain in unspecified joint: Secondary | ICD-10-CM | POA: Diagnosis not present

## 2018-11-24 DIAGNOSIS — I2510 Atherosclerotic heart disease of native coronary artery without angina pectoris: Secondary | ICD-10-CM | POA: Diagnosis not present

## 2018-11-24 DIAGNOSIS — Z20828 Contact with and (suspected) exposure to other viral communicable diseases: Secondary | ICD-10-CM | POA: Diagnosis present

## 2018-11-24 DIAGNOSIS — Z8349 Family history of other endocrine, nutritional and metabolic diseases: Secondary | ICD-10-CM

## 2018-11-24 DIAGNOSIS — E872 Acidosis: Secondary | ICD-10-CM | POA: Diagnosis present

## 2018-11-24 DIAGNOSIS — Z681 Body mass index (BMI) 19 or less, adult: Secondary | ICD-10-CM | POA: Diagnosis not present

## 2018-11-24 DIAGNOSIS — Z794 Long term (current) use of insulin: Secondary | ICD-10-CM | POA: Diagnosis not present

## 2018-11-24 DIAGNOSIS — N183 Chronic kidney disease, stage 3 (moderate): Secondary | ICD-10-CM | POA: Diagnosis not present

## 2018-11-24 DIAGNOSIS — Z931 Gastrostomy status: Secondary | ICD-10-CM | POA: Diagnosis not present

## 2018-11-24 DIAGNOSIS — K9423 Gastrostomy malfunction: Secondary | ICD-10-CM | POA: Diagnosis not present

## 2018-11-24 DIAGNOSIS — S06350A Traumatic hemorrhage of left cerebrum without loss of consciousness, initial encounter: Secondary | ICD-10-CM | POA: Diagnosis not present

## 2018-11-24 DIAGNOSIS — G40909 Epilepsy, unspecified, not intractable, without status epilepticus: Secondary | ICD-10-CM | POA: Diagnosis not present

## 2018-11-24 DIAGNOSIS — L89159 Pressure ulcer of sacral region, unspecified stage: Secondary | ICD-10-CM

## 2018-11-24 DIAGNOSIS — R6520 Severe sepsis without septic shock: Secondary | ICD-10-CM

## 2018-11-24 DIAGNOSIS — Z87891 Personal history of nicotine dependence: Secondary | ICD-10-CM

## 2018-11-24 DIAGNOSIS — Y95 Nosocomial condition: Secondary | ICD-10-CM | POA: Diagnosis present

## 2018-11-24 DIAGNOSIS — Z8249 Family history of ischemic heart disease and other diseases of the circulatory system: Secondary | ICD-10-CM

## 2018-11-24 DIAGNOSIS — F0280 Dementia in other diseases classified elsewhere without behavioral disturbance: Secondary | ICD-10-CM | POA: Diagnosis present

## 2018-11-24 DIAGNOSIS — F09 Unspecified mental disorder due to known physiological condition: Secondary | ICD-10-CM | POA: Diagnosis present

## 2018-11-24 DIAGNOSIS — I82409 Acute embolism and thrombosis of unspecified deep veins of unspecified lower extremity: Secondary | ICD-10-CM

## 2018-11-24 DIAGNOSIS — G309 Alzheimer's disease, unspecified: Secondary | ICD-10-CM | POA: Diagnosis present

## 2018-11-24 DIAGNOSIS — R2231 Localized swelling, mass and lump, right upper limb: Secondary | ICD-10-CM | POA: Diagnosis not present

## 2018-11-24 DIAGNOSIS — J189 Pneumonia, unspecified organism: Secondary | ICD-10-CM | POA: Diagnosis not present

## 2018-11-24 DIAGNOSIS — R6251 Failure to thrive (child): Secondary | ICD-10-CM

## 2018-11-24 DIAGNOSIS — J9811 Atelectasis: Secondary | ICD-10-CM | POA: Diagnosis not present

## 2018-11-24 DIAGNOSIS — H25042 Posterior subcapsular polar age-related cataract, left eye: Secondary | ICD-10-CM | POA: Diagnosis not present

## 2018-11-24 DIAGNOSIS — Z23 Encounter for immunization: Secondary | ICD-10-CM | POA: Diagnosis not present

## 2018-11-24 DIAGNOSIS — E1165 Type 2 diabetes mellitus with hyperglycemia: Secondary | ICD-10-CM | POA: Diagnosis present

## 2018-11-24 DIAGNOSIS — Z9981 Dependence on supplemental oxygen: Secondary | ICD-10-CM | POA: Diagnosis not present

## 2018-11-24 DIAGNOSIS — I219 Acute myocardial infarction, unspecified: Secondary | ICD-10-CM | POA: Diagnosis not present

## 2018-11-24 DIAGNOSIS — Z8507 Personal history of malignant neoplasm of pancreas: Secondary | ICD-10-CM

## 2018-11-24 DIAGNOSIS — R402312 Coma scale, best motor response, none, at arrival to emergency department: Secondary | ICD-10-CM | POA: Diagnosis not present

## 2018-11-24 DIAGNOSIS — E1169 Type 2 diabetes mellitus with other specified complication: Secondary | ICD-10-CM | POA: Diagnosis not present

## 2018-11-24 DIAGNOSIS — R6889 Other general symptoms and signs: Secondary | ICD-10-CM

## 2018-11-24 DIAGNOSIS — Z955 Presence of coronary angioplasty implant and graft: Secondary | ICD-10-CM

## 2018-11-24 DIAGNOSIS — I469 Cardiac arrest, cause unspecified: Secondary | ICD-10-CM | POA: Diagnosis not present

## 2018-11-24 DIAGNOSIS — R402212 Coma scale, best verbal response, none, at arrival to emergency department: Secondary | ICD-10-CM | POA: Diagnosis present

## 2018-11-24 DIAGNOSIS — Z451 Encounter for adjustment and management of infusion pump: Secondary | ICD-10-CM | POA: Diagnosis not present

## 2018-11-24 DIAGNOSIS — G9349 Other encephalopathy: Secondary | ICD-10-CM | POA: Diagnosis present

## 2018-11-24 DIAGNOSIS — Z7401 Bed confinement status: Secondary | ICD-10-CM | POA: Diagnosis not present

## 2018-11-24 DIAGNOSIS — J9601 Acute respiratory failure with hypoxia: Secondary | ICD-10-CM

## 2018-11-24 DIAGNOSIS — R627 Adult failure to thrive: Secondary | ICD-10-CM | POA: Diagnosis not present

## 2018-11-24 DIAGNOSIS — R4020 Unspecified coma: Secondary | ICD-10-CM | POA: Diagnosis not present

## 2018-11-24 DIAGNOSIS — Z960 Presence of urogenital implants: Secondary | ICD-10-CM | POA: Diagnosis present

## 2018-11-24 DIAGNOSIS — R1310 Dysphagia, unspecified: Secondary | ICD-10-CM | POA: Diagnosis present

## 2018-11-24 DIAGNOSIS — R918 Other nonspecific abnormal finding of lung field: Secondary | ICD-10-CM | POA: Diagnosis not present

## 2018-11-24 DIAGNOSIS — Z8673 Personal history of transient ischemic attack (TIA), and cerebral infarction without residual deficits: Secondary | ICD-10-CM | POA: Diagnosis not present

## 2018-11-24 DIAGNOSIS — I10 Essential (primary) hypertension: Secondary | ICD-10-CM | POA: Diagnosis not present

## 2018-11-24 DIAGNOSIS — Z982 Presence of cerebrospinal fluid drainage device: Secondary | ICD-10-CM

## 2018-11-24 DIAGNOSIS — Z7982 Long term (current) use of aspirin: Secondary | ICD-10-CM

## 2018-11-24 DIAGNOSIS — Z888 Allergy status to other drugs, medicaments and biological substances status: Secondary | ICD-10-CM

## 2018-11-24 DIAGNOSIS — E785 Hyperlipidemia, unspecified: Secondary | ICD-10-CM | POA: Diagnosis present

## 2018-11-24 DIAGNOSIS — I130 Hypertensive heart and chronic kidney disease with heart failure and stage 1 through stage 4 chronic kidney disease, or unspecified chronic kidney disease: Secondary | ICD-10-CM | POA: Diagnosis present

## 2018-11-24 DIAGNOSIS — E43 Unspecified severe protein-calorie malnutrition: Secondary | ICD-10-CM | POA: Diagnosis not present

## 2018-11-24 DIAGNOSIS — G9340 Encephalopathy, unspecified: Secondary | ICD-10-CM | POA: Diagnosis not present

## 2018-11-24 DIAGNOSIS — M1990 Unspecified osteoarthritis, unspecified site: Secondary | ICD-10-CM | POA: Diagnosis not present

## 2018-11-24 DIAGNOSIS — K7581 Nonalcoholic steatohepatitis (NASH): Secondary | ICD-10-CM | POA: Diagnosis not present

## 2018-11-24 DIAGNOSIS — Z4800 Encounter for change or removal of nonsurgical wound dressing: Secondary | ICD-10-CM | POA: Diagnosis not present

## 2018-11-24 DIAGNOSIS — E440 Moderate protein-calorie malnutrition: Secondary | ICD-10-CM | POA: Diagnosis present

## 2018-11-24 DIAGNOSIS — Z79899 Other long term (current) drug therapy: Secondary | ICD-10-CM

## 2018-11-24 DIAGNOSIS — Z833 Family history of diabetes mellitus: Secondary | ICD-10-CM

## 2018-11-24 DIAGNOSIS — K861 Other chronic pancreatitis: Secondary | ICD-10-CM | POA: Diagnosis not present

## 2018-11-24 DIAGNOSIS — D631 Anemia in chronic kidney disease: Secondary | ICD-10-CM | POA: Diagnosis present

## 2018-11-24 DIAGNOSIS — J690 Pneumonitis due to inhalation of food and vomit: Secondary | ICD-10-CM | POA: Diagnosis present

## 2018-11-24 DIAGNOSIS — Z885 Allergy status to narcotic agent status: Secondary | ICD-10-CM

## 2018-11-24 DIAGNOSIS — Z515 Encounter for palliative care: Secondary | ICD-10-CM | POA: Diagnosis not present

## 2018-11-24 DIAGNOSIS — M7989 Other specified soft tissue disorders: Secondary | ICD-10-CM

## 2018-11-24 DIAGNOSIS — R413 Other amnesia: Secondary | ICD-10-CM | POA: Diagnosis not present

## 2018-11-24 DIAGNOSIS — T85598A Other mechanical complication of other gastrointestinal prosthetic devices, implants and grafts, initial encounter: Secondary | ICD-10-CM

## 2018-11-24 DIAGNOSIS — E876 Hypokalemia: Secondary | ICD-10-CM | POA: Diagnosis present

## 2018-11-24 DIAGNOSIS — R404 Transient alteration of awareness: Secondary | ICD-10-CM | POA: Diagnosis not present

## 2018-11-24 DIAGNOSIS — Z978 Presence of other specified devices: Secondary | ICD-10-CM | POA: Diagnosis not present

## 2018-11-24 LAB — URINALYSIS, ROUTINE W REFLEX MICROSCOPIC
Bilirubin Urine: NEGATIVE
Glucose, UA: >=500 mg/dL — AB
Ketones, ur: NEGATIVE mg/dL
Nitrite: NEGATIVE
Protein, ur: 100 mg/dL — AB
RBC / HPF: >50 RBC/hpf — ABNORMAL HIGH (ref 0–5)
Specific Gravity, Urine: 1.026 (ref 1.005–1.030)
Squamous Epithelial / HPF: NONE SEEN (ref 0–5)
WBC, UA: >50 WBC/hpf — ABNORMAL HIGH (ref 0–5)
pH: 6 (ref 5.0–8.0)

## 2018-11-24 LAB — CBC WITH DIFFERENTIAL/PLATELET
Abs Immature Granulocytes: 0.08 10*3/uL — ABNORMAL HIGH (ref 0.00–0.07)
Basophils Absolute: 0 10*3/uL (ref 0.0–0.1)
Basophils Relative: 0 %
Eosinophils Absolute: 0 10*3/uL (ref 0.0–0.5)
Eosinophils Relative: 0 %
HCT: 39.8 % (ref 39.0–52.0)
Hemoglobin: 12.2 g/dL — ABNORMAL LOW (ref 13.0–17.0)
Immature Granulocytes: 1 %
Lymphocytes Relative: 8 %
Lymphs Abs: 1 10*3/uL (ref 0.7–4.0)
MCH: 28.9 pg (ref 26.0–34.0)
MCHC: 30.7 g/dL (ref 30.0–36.0)
MCV: 94.3 fL (ref 80.0–100.0)
Monocytes Absolute: 1.3 10*3/uL — ABNORMAL HIGH (ref 0.1–1.0)
Monocytes Relative: 10 %
Neutro Abs: 10.4 10*3/uL — ABNORMAL HIGH (ref 1.7–7.7)
Neutrophils Relative %: 81 %
Platelets: 249 10*3/uL (ref 150–400)
RBC: 4.22 MIL/uL (ref 4.22–5.81)
RDW: 16.8 % — ABNORMAL HIGH (ref 11.5–15.5)
WBC: 12.8 10*3/uL — ABNORMAL HIGH (ref 4.0–10.5)
nRBC: 0 % (ref 0.0–0.2)

## 2018-11-24 LAB — BLOOD GAS, ARTERIAL
Acid-Base Excess: 3.8 mmol/L — ABNORMAL HIGH (ref 0.0–2.0)
Acid-Base Excess: 4.2 mmol/L — ABNORMAL HIGH (ref 0.0–2.0)
Bicarbonate: 30.9 mmol/L — ABNORMAL HIGH (ref 20.0–28.0)
Bicarbonate: 27.3 mmol/L (ref 20.0–28.0)
FIO2: 0.35
MECHVT: 500 mL
Mechanical Rate: 16
O2 Saturation: 99.4 %
O2 Saturation: 96.3 %
PEEP: 5 cmH2O
Patient temperature: 37
Patient temperature: 40
pCO2 arterial: 56 mmHg — ABNORMAL HIGH (ref 32.0–48.0)
pCO2 arterial: 35 mmHg (ref 32.0–48.0)
pH, Arterial: 7.35 (ref 7.350–7.450)
pH, Arterial: 7.45 (ref 7.350–7.450)
pO2, Arterial: 162 mmHg — ABNORMAL HIGH (ref 83.0–108.0)
pO2, Arterial: 93 mmHg (ref 83.0–108.0)

## 2018-11-24 LAB — COMPREHENSIVE METABOLIC PANEL
ALT: 14 U/L (ref 0–44)
AST: 27 U/L (ref 15–41)
Albumin: 2.5 g/dL — ABNORMAL LOW (ref 3.5–5.0)
Alkaline Phosphatase: 136 U/L — ABNORMAL HIGH (ref 38–126)
Anion gap: 7 (ref 5–15)
BUN: 20 mg/dL (ref 8–23)
CO2: 29 mmol/L (ref 22–32)
Calcium: 8.2 mg/dL — ABNORMAL LOW (ref 8.9–10.3)
Chloride: 107 mmol/L (ref 98–111)
Creatinine, Ser: 0.6 mg/dL — ABNORMAL LOW (ref 0.61–1.24)
GFR calc Af Amer: >60 mL/min (ref >60)
GFR calc non Af Amer: >60 mL/min (ref >60)
Glucose, Bld: 405 mg/dL — ABNORMAL HIGH (ref 70–99)
Potassium: 4.5 mmol/L (ref 3.5–5.1)
Sodium: 143 mmol/L (ref 135–145)
Total Bilirubin: 0.7 mg/dL (ref 0.3–1.2)
Total Protein: 6.7 g/dL (ref 6.5–8.1)

## 2018-11-24 LAB — LACTIC ACID, PLASMA
Lactic Acid, Venous: 1.4 mmol/L (ref 0.5–1.9)
Lactic Acid, Venous: 2.4 mmol/L (ref 0.5–1.9)

## 2018-11-24 LAB — MRSA PCR SCREENING: MRSA by PCR: NEGATIVE

## 2018-11-24 LAB — INFLUENZA PANEL BY PCR (TYPE A & B)
Influenza A By PCR: NEGATIVE
Influenza B By PCR: NEGATIVE

## 2018-11-24 LAB — TROPONIN I: Troponin I: <0.03 ng/mL (ref <0.03)

## 2018-11-24 LAB — GLUCOSE, CAPILLARY: Glucose-Capillary: 283 mg/dL — ABNORMAL HIGH (ref 70–99)

## 2018-11-24 LAB — PROCALCITONIN: Procalcitonin: 0.1 ng/mL

## 2018-11-24 MED ORDER — ASPIRIN 81 MG PO CHEW: 81.0000 mg | CHEWABLE_TABLET | Freq: Every day | ORAL | Status: DC

## 2018-11-24 MED ORDER — VANCOMYCIN HCL IN DEXTROSE 1-5 GM/200ML-% IV SOLN: 1000.0000 mg | Freq: Once | INTRAVENOUS | Status: DC

## 2018-11-24 MED ORDER — ACETAMINOPHEN 325 MG PO TABS
650.0000 mg | ORAL_TABLET | Freq: Four times a day (QID) | ORAL | Status: DC | PRN
  Administered 2018-11-25 – 2018-12-07 (×5): 650 mg via ORAL
  Filled 2018-11-24 (×5): qty 2

## 2018-11-24 MED ORDER — SODIUM CHLORIDE 0.9 % IV SOLN
500.0000 mg | INTRAVENOUS | Status: DC
  Administered 2018-11-25 – 2018-11-26 (×2): 500 mg via INTRAVENOUS
  Filled 2018-11-24 (×3): qty 500

## 2018-11-24 MED ORDER — ENOXAPARIN SODIUM 40 MG/0.4ML ~~LOC~~ SOLN
40.0000 mg | SUBCUTANEOUS | Status: DC
  Administered 2018-11-24 – 2018-12-08 (×15): 40 mg via SUBCUTANEOUS
  Filled 2018-11-24 (×15): qty 0.4

## 2018-11-24 MED ORDER — CHLORHEXIDINE GLUCONATE CLOTH 2 % EX PADS
6.0000 | MEDICATED_PAD | Freq: Every day | CUTANEOUS | Status: DC
  Administered 2018-11-24 – 2018-11-28 (×5): 6 via TOPICAL

## 2018-11-24 MED ORDER — NYSTATIN 100000 UNIT/ML MT SUSP
5.0000 mL | Freq: Four times a day (QID) | OROMUCOSAL | Status: DC
  Filled 2018-11-24 (×2): qty 5

## 2018-11-24 MED ORDER — ENOXAPARIN SODIUM 40 MG/0.4ML ~~LOC~~ SOLN: 40.0000 mg | SUBCUTANEOUS | Status: DC

## 2018-11-24 MED ORDER — SODIUM CHLORIDE 0.9 % IV SOLN
INTRAVENOUS | Status: DC
  Administered 2018-11-24 – 2018-11-26 (×4): via INTRAVENOUS

## 2018-11-24 MED ORDER — INSULIN GLARGINE 100 UNIT/ML ~~LOC~~ SOLN
10.0000 [IU] | SUBCUTANEOUS | Status: DC
  Administered 2018-11-24 – 2018-12-08 (×14): 10 [IU] via SUBCUTANEOUS
  Filled 2018-11-24 (×17): qty 0.1

## 2018-11-24 MED ORDER — CLOPIDOGREL BISULFATE 75 MG PO TABS: 75.0000 mg | ORAL_TABLET | Freq: Every day | ORAL | Status: DC

## 2018-11-24 MED ORDER — VITAMIN C 500 MG PO TABS: 250.0000 mg | ORAL_TABLET | Freq: Two times a day (BID) | ORAL | Status: DC

## 2018-11-24 MED ORDER — CHLORHEXIDINE GLUCONATE 0.12% ORAL RINSE (MEDLINE KIT)
15.0000 mL | Freq: Two times a day (BID) | OROMUCOSAL | Status: DC
  Administered 2018-11-24 – 2018-12-07 (×21): 15 mL via OROMUCOSAL
  Filled 2018-11-24: qty 15

## 2018-11-24 MED ORDER — SODIUM CHLORIDE 0.9 % IV SOLN
500.0000 mg | Freq: Once | INTRAVENOUS | Status: AC
  Administered 2018-11-24: 500 mg via INTRAVENOUS
  Filled 2018-11-24: qty 500

## 2018-11-24 MED ORDER — CLOPIDOGREL BISULFATE 75 MG PO TABS
75.0000 mg | ORAL_TABLET | ORAL | Status: DC
  Administered 2018-11-25 – 2018-12-09 (×14): 75 mg
  Filled 2018-11-24 (×14): qty 1

## 2018-11-24 MED ORDER — ORAL CARE MOUTH RINSE
15.0000 mL | OROMUCOSAL | Status: DC
  Administered 2018-11-24 – 2018-12-07 (×86): 15 mL via OROMUCOSAL

## 2018-11-24 MED ORDER — POTASSIUM CHLORIDE 20 MEQ PO PACK
20.0000 meq | PACK | ORAL | Status: DC
  Administered 2018-11-25: 20 meq
  Filled 2018-11-24: qty 1

## 2018-11-24 MED ORDER — ONDANSETRON HCL 4 MG/2ML IJ SOLN: 4.0000 mg | Freq: Four times a day (QID) | INTRAMUSCULAR | Status: DC | PRN

## 2018-11-24 MED ORDER — PANTOPRAZOLE SODIUM 40 MG PO PACK: 40.0000 mg | PACK | Freq: Every day | ORAL | Status: DC

## 2018-11-24 MED ORDER — INSULIN ASPART 100 UNIT/ML ~~LOC~~ SOLN
0.0000 [IU] | Freq: Three times a day (TID) | SUBCUTANEOUS | Status: DC
  Filled 2018-11-24: qty 1

## 2018-11-24 MED ORDER — CLINDAMYCIN PHOSPHATE 600 MG/50ML IV SOLN
600.0000 mg | Freq: Three times a day (TID) | INTRAVENOUS | Status: DC
  Administered 2018-11-24 – 2018-11-26 (×6): 600 mg via INTRAVENOUS
  Filled 2018-11-24 (×8): qty 50

## 2018-11-24 MED ORDER — METRONIDAZOLE IN NACL 5-0.79 MG/ML-% IV SOLN: 500.0000 mg | Freq: Once | INTRAVENOUS | Status: DC

## 2018-11-24 MED ORDER — ACETAMINOPHEN 325 MG PO TABS: 650.0000 mg | ORAL_TABLET | ORAL | Status: DC | PRN

## 2018-11-24 MED ORDER — DONEPEZIL HCL 5 MG PO TABS
10.0000 mg | ORAL_TABLET | Freq: Two times a day (BID) | ORAL | Status: DC
  Filled 2018-11-24: qty 2

## 2018-11-24 MED ORDER — VITAMIN C 500 MG PO TABS
250.0000 mg | ORAL_TABLET | Freq: Two times a day (BID) | ORAL | Status: DC
  Administered 2018-11-24 – 2018-12-09 (×28): 250 mg
  Filled 2018-11-24 (×28): qty 1

## 2018-11-24 MED ORDER — IPRATROPIUM-ALBUTEROL 0.5-2.5 (3) MG/3ML IN SOLN
3.0000 mL | Freq: Four times a day (QID) | RESPIRATORY_TRACT | Status: DC
  Administered 2018-11-24 – 2018-11-26 (×8): 3 mL via RESPIRATORY_TRACT
  Filled 2018-11-24 (×8): qty 3

## 2018-11-24 MED ORDER — SODIUM CHLORIDE 0.9 % IV SOLN
2.0000 g | Freq: Three times a day (TID) | INTRAVENOUS | Status: DC
  Administered 2018-11-24 – 2018-11-30 (×17): 2 g via INTRAVENOUS
  Filled 2018-11-24 (×19): qty 2

## 2018-11-24 MED ORDER — CHOLECALCIFEROL 10 MCG/ML (400 UNIT/ML) PO LIQD
1000.0000 [IU] | ORAL | Status: DC
  Administered 2018-11-25 – 2018-12-09 (×13): 1000 [IU]
  Filled 2018-11-24 (×19): qty 2.5

## 2018-11-24 MED ORDER — INSULIN ASPART 100 UNIT/ML ~~LOC~~ SOLN: 0.0000 [IU] | Freq: Every day | SUBCUTANEOUS | Status: DC

## 2018-11-24 MED ORDER — SODIUM CHLORIDE 0.9 % IV BOLUS
2000.0000 mL | Freq: Once | INTRAVENOUS | Status: AC
  Administered 2018-11-24: 2000 mL via INTRAVENOUS

## 2018-11-24 MED ORDER — ACETAMINOPHEN 650 MG RE SUPP
650.0000 mg | Freq: Four times a day (QID) | RECTAL | Status: DC | PRN
  Administered 2018-11-24 – 2018-11-25 (×2): 650 mg via RECTAL
  Filled 2018-11-24 (×2): qty 1

## 2018-11-24 MED ORDER — PANTOPRAZOLE SODIUM 40 MG PO PACK
40.0000 mg | PACK | ORAL | Status: DC
  Administered 2018-11-25 – 2018-12-09 (×14): 40 mg
  Filled 2018-11-24 (×17): qty 20

## 2018-11-24 MED ORDER — CHOLECALCIFEROL 10 MCG/ML (400 UNIT/ML) PO LIQD
1000.0000 [IU] | Freq: Every day | ORAL | Status: DC
  Filled 2018-11-24: qty 2.5

## 2018-11-24 MED ORDER — INSULIN ASPART 100 UNIT/ML ~~LOC~~ SOLN: 0.0000 [IU] | Freq: Three times a day (TID) | SUBCUTANEOUS | Status: DC

## 2018-11-24 MED ORDER — DONEPEZIL HCL 5 MG PO TABS
10.0000 mg | ORAL_TABLET | Freq: Two times a day (BID) | ORAL | Status: DC
  Administered 2018-11-24 – 2018-11-28 (×7): 10 mg
  Administered 2018-11-29: 5 mg
  Administered 2018-11-29 – 2018-12-09 (×20): 10 mg
  Filled 2018-11-24 (×32): qty 2

## 2018-11-24 MED ORDER — INSULIN ASPART PROT & ASPART (70-30 MIX) 100 UNIT/ML ~~LOC~~ SUSP
7.0000 [IU] | Freq: Three times a day (TID) | SUBCUTANEOUS | Status: DC
  Administered 2018-11-24: 7 [IU] via SUBCUTANEOUS

## 2018-11-24 MED ORDER — SODIUM CHLORIDE 0.9 % IV SOLN
1.0000 g | Freq: Once | INTRAVENOUS | Status: AC
  Administered 2018-11-24: 1 g via INTRAVENOUS
  Filled 2018-11-24: qty 1

## 2018-11-24 MED ORDER — GLUCERNA 1.5 CAL PO LIQD
1000.0000 mL | ORAL | Status: DC
  Administered 2018-11-24 – 2018-11-25 (×2): 1000 mL

## 2018-11-24 MED ORDER — METOPROLOL TARTRATE 25 MG PO TABS
25.0000 mg | ORAL_TABLET | Freq: Two times a day (BID) | ORAL | Status: DC
  Administered 2018-11-24 – 2018-11-26 (×2): 25 mg
  Filled 2018-11-24 (×3): qty 1

## 2018-11-24 MED ORDER — BISACODYL 5 MG PO TBEC: 5.0000 mg | DELAYED_RELEASE_TABLET | Freq: Every day | ORAL | Status: DC | PRN

## 2018-11-24 MED ORDER — MEMANTINE HCL 5 MG PO TABS
10.0000 mg | ORAL_TABLET | Freq: Two times a day (BID) | ORAL | Status: DC
  Administered 2018-11-24 – 2018-12-09 (×28): 10 mg
  Filled 2018-11-24 (×2): qty 2
  Filled 2018-11-24 (×2): qty 1
  Filled 2018-11-24: qty 2
  Filled 2018-11-24: qty 1
  Filled 2018-11-24 (×4): qty 2
  Filled 2018-11-24 (×2): qty 1
  Filled 2018-11-24: qty 2
  Filled 2018-11-24 (×4): qty 1
  Filled 2018-11-24 (×2): qty 2
  Filled 2018-11-24 (×4): qty 1
  Filled 2018-11-24 (×4): qty 2
  Filled 2018-11-24: qty 1
  Filled 2018-11-24 (×3): qty 2

## 2018-11-24 MED ORDER — SODIUM CHLORIDE 0.9 % IV BOLUS
1000.0000 mL | Freq: Once | INTRAVENOUS | Status: AC
  Administered 2018-11-24: 1000 mL via INTRAVENOUS

## 2018-11-24 MED ORDER — POTASSIUM CHLORIDE 20 MEQ PO PACK: 20.0000 meq | PACK | Freq: Every day | ORAL | Status: DC

## 2018-11-24 MED ORDER — METOPROLOL TARTRATE 25 MG PO TABS: 25.0000 mg | ORAL_TABLET | Freq: Two times a day (BID) | ORAL | Status: DC

## 2018-11-24 MED ORDER — DEXMEDETOMIDINE HCL IN NACL 400 MCG/100ML IV SOLN
0.4000 ug/kg/h | INTRAVENOUS | Status: DC
  Administered 2018-11-24: 0.6 ug/kg/h via INTRAVENOUS
  Filled 2018-11-24 (×2): qty 100

## 2018-11-24 MED ORDER — NYSTATIN 100000 UNIT/ML MT SUSP
5.0000 mL | Freq: Four times a day (QID) | OROMUCOSAL | Status: DC
  Administered 2018-11-24 – 2018-12-09 (×56): 500000 [IU] via ORAL
  Filled 2018-11-24 (×54): qty 5

## 2018-11-24 MED ORDER — SENNOSIDES-DOCUSATE SODIUM 8.6-50 MG PO TABS: 1.0000 | ORAL_TABLET | Freq: Every evening | ORAL | Status: DC | PRN

## 2018-11-24 MED ORDER — SODIUM CHLORIDE 0.9 % IV SOLN
500.0000 mg | INTRAVENOUS | Status: DC
  Filled 2018-11-24: qty 500

## 2018-11-24 MED ORDER — INSULIN GLARGINE 100 UNIT/ML ~~LOC~~ SOLN
10.0000 [IU] | Freq: Every day | SUBCUTANEOUS | Status: DC
  Filled 2018-11-24: qty 0.1

## 2018-11-24 MED ORDER — NOREPINEPHRINE 4 MG/250ML-% IV SOLN
0.0000 ug/min | INTRAVENOUS | Status: DC
  Administered 2018-11-24: 2 ug/min via INTRAVENOUS
  Filled 2018-11-24 (×2): qty 250

## 2018-11-24 MED ORDER — ASPIRIN 81 MG PO CHEW
81.0000 mg | CHEWABLE_TABLET | ORAL | Status: DC
  Administered 2018-11-25 – 2018-12-09 (×14): 81 mg
  Filled 2018-11-24 (×14): qty 1

## 2018-11-24 MED ORDER — ONDANSETRON HCL 4 MG PO TABS: 4.0000 mg | ORAL_TABLET | Freq: Four times a day (QID) | ORAL | Status: DC | PRN

## 2018-11-24 MED ORDER — MEMANTINE HCL 10 MG PO TABS
10.0000 mg | ORAL_TABLET | Freq: Two times a day (BID) | ORAL | Status: DC
  Filled 2018-11-24: qty 1

## 2018-11-24 MED ORDER — FREE WATER
180.0000 mL | Status: DC
  Administered 2018-11-24 – 2018-11-25 (×5): 180 mL

## 2018-11-24 MED ORDER — CLINDAMYCIN PHOSPHATE 600 MG/50ML IV SOLN
600.0000 mg | Freq: Once | INTRAVENOUS | Status: DC
  Filled 2018-11-24: qty 50

## 2018-11-24 NOTE — ED Notes (Signed)
Foley cath in place upon arrival to ED.

## 2018-11-24 NOTE — Consult Note (Signed)
CRITICAL CARE NOTE      CHIEF COMPLAINT:   Acute hypoxemic respiratory failure  HPI   History was unable to be obtained from patient due to being on mechanical ventilation with appropriate analgesia and sedation.  HPI was obtained from electronic medical record and as per hospitalist HPI below: Maurice Patterson  is a 63 y.o. male with a known history of coronary artery disease, chronic congestive heart failure, chronic kidney disease stage III, type 2 diabetes mellitus, GERD, pancreatic cancer, seizure disorder, stroke was brought from the facility for decreased responsiveness and fever.  Patient was unresponsive with fever when he presented to the emergency room.  He was electively intubated and put on ventilator by ER physician.  Not much history could be obtained from the patient.  According to the ER physician patient was short of breath was hypoxic and had to be put on ventilator.  Flu test was sent and coronavirus COVID-19 test was also sent.  Patient was put on precautions that is contact and airborne in the emergency room.  PAST MEDICAL HISTORY   Past Medical History:  Diagnosis Date  . Acute MI ([place])   . Arthritis   . Back pain   . CAD (coronary artery disease)   . CHF (congestive heart failure) ([place])   . Chronic kidney disease   . Degenerative lumbar disc   . Dementia ([place])   . Diabetes mellitus without complication ([place])   . GERD (gastroesophageal reflux disease)   . Hernia of abdominal cavity   . Hyperlipemia   . Hypertension   . Malignant intraductal papillary mucinous tumor of pancreas ([place])   . Memory loss   . Pancreatitis   . Seizures ([place])    staring spells  . Shingles   . Stroke ([place]) 08/2017  . TIA (transient ischemic attack)      SURGICAL HISTORY   Past Surgical History:  Procedure  Laterality Date  . CARDIAC CATHETERIZATION    . CORONARY ANGIOPLASTY    . CYSTOSCOPY WITH STENT PLACEMENT Right 05/13/2017   Procedure: CYSTOSCOPY WITH STENT PLACEMENT;  Surgeon: Nickie Retort, MD;  Location: [facility] ORS;  Service: Urology;  Laterality: Right;  . ERCP N/A 09/12/2015   Procedure: ENDOSCOPIC RETROGRADE CHOLANGIOPANCREATOGRAPHY (ERCP);  Surgeon: Hulen Luster, MD;  Location[facility] ENDOSCOPY;  Service: Gastroenterology;  Laterality: N/A;  . ERCP N/A 01/02/2016   Procedure: ENDOSCOPIC RETROGRADE CHOLANGIOPANCREATOGRAPHY (ERCP);  Surgeon: Hulen Luster, MD;  Location: [facility] ENDOSCOPY;  Service: Gastroenterology;  Laterality: N/A;  . EXTERNAL FIXATION WRIST FRACTURE Left   . left arm metal plate    . LEFT HEART CATH AND CORONARY ANGIOGRAPHY Left 11/04/2016   Procedure: Left Heart Cath and Coronary Angiography;  Surgeon: Isaias Cowman, MD;  Location: [place] CV LAB;  Service: Cardiovascular;  Laterality: Left;  . Left shoulder surgery    . PEG PLACEMENT Left 08/28/2018   Procedure: PERCUTANEOUS ENDOSCOPIC GASTROSTOMY (PEG) PLACEMENT;  Surgeon: Lin Landsman, MD;  Location: [facility] ENDOSCOPY;  Service: Gastroenterology;  Laterality: Left;  . Stent times 3  2013   Cardiac  . TOTAL ELBOW REPLACEMENT Left   . VASECTOMY    . VENTRICULOPERITONEAL SHUNT       FAMILY HISTORY   Family History  Problem Relation Age of Onset  . Diabetes Mother   . Hypertension Mother   . CAD Mother   . Hyperlipidemia Mother   . Stroke Mother   . ALS Mother   . Alzheimer's disease Father   .  Diabetes Father   . Heart Problems Brother      SOCIAL HISTORY   Social History   Tobacco Use  . Smoking status: Former Smoker    Last attempt to quit: 2013    Years since quitting: 7.2  . Smokeless tobacco: Never Used  . Tobacco comment: used to smoke 2PD for 40 yrs, quit about 4 years ago  Substance Use Topics  . Alcohol use: No    Alcohol/week: 0.0 standard drinks    Comment: occasional  .  Drug use: No     MEDICATIONS   Current Medication:  Current Facility-Administered Medications:  .  [START ON 11/25/2018] azithromycin (ZITHROMAX) 500 mg in sodium chloride 0.9 % 250 mL IVPB, 500 mg, Intravenous, Q24H, Pyreddy, Pavan, MD .  ceFEPIme (MAXIPIME) 2 g in sodium chloride 0.9 % 100 mL IVPB, 2 g, Intravenous, Q8H, Duncan, Asajah R, RPH .  clindamycin (CLEOCIN) IVPB 600 mg, 600 mg, Intravenous, Q8H, Pyreddy, Pavan, MD .  insulin aspart (novoLOG) injection 0-20 Units, 0-20 Units, Subcutaneous, TID WC, Pyreddy, Pavan, MD .  insulin aspart (novoLOG) injection 0-5 Units, 0-5 Units, Subcutaneous, QHS, Pyreddy, Pavan, MD .  ipratropium-albuterol (DUONEB) 0.5-2.5 (3) MG/3ML nebulizer solution 3 mL, 3 mL, Nebulization, Q6H, Pyreddy, Pavan, MD .  norepinephrine (LEVOPHED) '4mg'$  in 267m premix infusion, 0-40 mcg/min, Intravenous, Titrated, Pyreddy, Pavan, MD    ALLERGIES   Hydrocodone; Morphine; Ambien [zolpidem]; Brilinta [ticagrelor]; Flexeril [cyclobenzaprine]; Flunitrazepam; Haldol [haloperidol lactate]; Levetiracetam; Lorazepam; Risperdal [risperidone]; Trazodone; Benadryl [diphenhydramine hcl (sleep)]; Penicillins; and Vancomycin    REVIEW OF SYSTEMS    10 system ROS unable to obtain due to sedation on mechanical ventilation  PHYSICAL EXAMINATION   Vitals:   11/24/18 1515 11/24/18 1540  BP: (!) 85/62 (!) 85/63  Pulse: 99 95  Resp: (!) 22 20  Temp:    SpO2: 100% 100%    GENERAL: No apparent distress currently sedated on mechanical ventilation HEAD: Normocephalic, atraumatic.  EYES: Unable to assess reactivity or symmetry due to coronavirus precautions MOUTH: Moist mucosal membrane. NECK: No grossly appreciable neck lesions PULMONARY: Unable to auscultate due to coronavirus precautions however nontachypneic CARDIOVASCULAR: Unable to auscultate heart sounds however monitoring telemetry GASTROINTESTINAL: Unable to auscultate bowel sounds, mildly obese abdomen  MUSCULOSKELETAL: Unable to appreciate clubbing or peripheral edema NEUROLOGIC: Currently sedated to a RA SS -1 SKIN:intact  COVID-19 DISASTER DECLARATION:   FULL CONTACT PHYSICAL EXAMINATION WAS NOT POSSIBLE DUE TO TREATMENT OF COVID-19  AND CONSERVATION OF PERSONAL PROTECTIVE EQUIPMENT, LIMITED EXAM FINDINGS INCLUDE-    Patient assessed or the symptoms described in the history of present illness.  In the context of the Global COVID-19 pandemic, which necessitated consideration that the patient might be at risk for infection with the SARS-CoV-2 virus that causes COVID-19, Institutional protocols and algorithms that pertain to the evaluation of patients at risk for COVID-19 are in a state of rapid change based on information released by regulatory bodies including the CDC and federal and state organizations. These policies and algorithms were followed during the patient's care while in hospital.    LABS AND IMAGING   TTE12/2019 ------------------------------------------------------------------- Study Conclusions  - Procedure narrative: Transthoracic echocardiography for left   ventricular function evaluation, for right ventricular function   evaluation, and for assessment of valvular function. Image   quality was suboptimal. - Left ventricle: The cavity size was normal. Wall thickness was   normal. Systolic function was normal. The estimated ejection   fraction was in the range of 60% to 65%. Images  were inadequate   for LV wall motion assessment. Doppler parameters are consistent   with abnormal left ventricular relaxation (grade 1 diastolic   dysfunction). - Aortic valve: Poorly visualized. Probably trileaflet; normal   thickness, mildly calcified leaflets.   LAB RESULTS: Recent Labs  Lab 11/24/18 1314  NA 143  K 4.5  CL 107  CO2 29  BUN 20  CREATININE 0.60*  GLUCOSE 405*   Recent Labs  Lab 11/24/18 1314  HGB 12.2*  HCT 39.8  WBC 12.8*  PLT 249     IMAGING  RESULTS: Dg Chest Portable 1 View  Result Date: 11/24/2018 CLINICAL DATA:  63 year old male with a history of intubation. EXAM: PORTABLE CHEST 1 VIEW COMPARISON:  10/24/2018, 10/07/2018, CT 10/24/2018 FINDINGS: Cardiomediastinal silhouette unchanged in size and contour. Low lung volumes with endotracheal tube terminating 3.6 cm above the carina. Gastric tube terminates out of the field of view within the abdomen. Mixed interstitial and airspace opacities, with reticulonodular pattern of opacity, worse from the comparison plain film. Blunting of the right costophrenic angle. No visualized pneumothorax. IMPRESSION: Endotracheal tube terminates suitably above the carina. Low lung volumes with mixed interstitial and airspace opacities, concerning for multifocal pneumonia. Gastric tube terminates out of the field of view. Electronically Signed   By: Corrie Mckusick D.O.   On: 11/24/2018 14:43      ASSESSMENT AND PLAN    -Multidisciplinary rounds held today  Acute Hypoxic Respiratory Failure           Right lower lobe pneumonia - possible aspiration           -Influenza negative, novel coronavirus pending -currently on cleocin/zithromax/cefepime - will discuss with pharmD  -continue Full MV support -continue Bronchodilator Therapy -Wean Fio2 and PEEP as tolerated -will perform SAT/SBT when respiratory parameters are met   CARDIAC FAILURE-         Grade 1 diastolic CHF history  -troponin normal  -oxygen as needed -Lasix as tolerated -follow up cardiac enzymes as indicated ICU monitoring    Severe hyperglycemia          No diabetic ketoacidosis at this time      -We will start weight-based subcu regimen and ISS ac/hs   Protein calorie malnutrition - moderate - albumin 2.5       Dietary consultation-appropriate nourishment likely aspiration precautions needed   NEUROLOGY - intubated and sedated - minimal sedation to achieve a RASS goal: -1 Wake up assessment pending   ID -continue  IV abx as prescibed -follow up cultures  GI/Nutrition GI PROPHYLAXIS as indicated DIET-->TF's as tolerated Constipation protocol as indicated  ENDO - ICU hypoglycemic\Hyperglycemia protocol -check FSBS per protocol   ELECTROLYTES -follow labs as needed -replace as needed -pharmacy consultation   DVT/GI PRX ordered -SCDs  TRANSFUSIONS AS NEEDED MONITOR FSBS ASSESS the need for LABS as needed   Critical care provider statement:    Critical care time (minutes):   125   Critical care time was exclusive of:  Separately billable procedures and treating other patients   Critical care was necessary to treat or prevent imminent or life-threatening deterioration of the following conditions:   Acute hypoxemic respiratory failure, right lower lobe pneumonia possibly aspiration related, protein calorie malnutrition-moderate, severe hyperglycemia, history of diastolic CHF, multiple comorbid conditions   Critical care was time spent personally by me on the following activities:  Development of treatment plan with patient or surrogate, discussions with consultants, evaluation of patient's response to treatment, examination of patient, obtaining  history from patient or surrogate, ordering and performing treatments and interventions, ordering and review of laboratory studies and re-evaluation of patient's condition.  I assumed direction of critical care for this patient from another provider in my specialty: no    This document was prepared using Dragon voice recognition software and may include unintentional dictation errors.    Ottie Glazier, M.D.  Division of [place]

## 2018-11-24 NOTE — ED Provider Notes (Signed)
[facility] Emergency Department Provider Note  ____________________________________________  Time seen: Approximately 2:03 PM  I have reviewed the triage vital signs and the nursing notes.   HISTORY  Chief Complaint Fever (unresponsive)  Level 5 caveat:  Portions of the history and physical were unable to be obtained due to unresponsive   HPI Maurice Patterson is a 63 y.o. male with history as listed below including recent subarachnoid hemorrhage who presents for cough and fever.  According to EMS patient has been unresponsive for a month since sustaining an intracranial hemorrhage.  Today he started with a cough, hypoxia, and fever.  Upon arrival patient was hypoxic to the 70s and was started on a nonrebreather.  Code status was confirmed with EMS and by review of the chart from recent admission from 30 days ago and confirmed to be full code. . Past Medical History:  Diagnosis Date  . Acute MI ([place])   . Arthritis   . Back pain   . CAD (coronary artery disease)   . CHF (congestive heart failure) ([place])   . Chronic kidney disease   . Degenerative lumbar disc   . Dementia ([place])   . Diabetes mellitus without complication ([place])   . GERD (gastroesophageal reflux disease)   . Hernia of abdominal cavity   . Hyperlipemia   . Hypertension   . Malignant intraductal papillary mucinous tumor of pancreas ([place])   . Memory loss   . Pancreatitis   . Seizures ([place])    staring spells  . Shingles   . Stroke ([place]) 08/2017  . TIA (transient ischemic attack)     Patient Active Problem List   Diagnosis Date Noted  . Acute respiratory failure ([place]) 11/24/2018  . Respiratory failure requiring intubation ([place]) 10/24/2018  . Protein-calorie malnutrition, severe 09/20/2018  . Diabetes ([place]) 09/19/2018  . Sepsis ([place]) 09/19/2018  . Pressure injury of skin 08/12/2018  . Aspiration pneumonia ([place])   . Palliative care encounter   . Dysphagia   . Acute respiratory  failure with hypoxia ([employer])   . Fever   . CAP (community acquired pneumonia) 07/22/2018  . GERD (gastroesophageal reflux disease) 07/22/2018  . Hypersalivation 01/30/2018  . Drooling 01/30/2018  . Alzheimer's dementia ([place]) 05/01/2017  . Cognitive decline 10/20/2016  . Chest pain, rule out acute myocardial infarction 04/05/2016  . Confusion 10/18/2015  . Constipation 08/23/2015  . Uncontrolled type 2 diabetes mellitus with hyperglycemia, with long-term current use of insulin ([place]) 08/23/2015  . Essential hypertension 08/23/2015  . Right lower lobe pneumonia ([place]) 08/23/2015  . Abdominal pain 08/21/2015  . Altered mental status 02/19/2015    Past Surgical History:  Procedure Laterality Date  . CARDIAC CATHETERIZATION    . CORONARY ANGIOPLASTY    . CYSTOSCOPY WITH STENT PLACEMENT Right 05/13/2017   Procedure: CYSTOSCOPY WITH STENT PLACEMENT;  Surgeon: Nickie Retort, MD;  Location: [facility] ORS;  Service: Urology;  Laterality: Right;  . ERCP N/A 09/12/2015   Procedure: ENDOSCOPIC RETROGRADE CHOLANGIOPANCREATOGRAPHY (ERCP);  Surgeon: Hulen Luster, MD;  Location: [facility] ENDOSCOPY;  Service: Gastroenterology;  Laterality: N/A;  . ERCP N/A 01/02/2016   Procedure: ENDOSCOPIC RETROGRADE CHOLANGIOPANCREATOGRAPHY (ERCP);  Surgeon: Hulen Luster, MD;  Location: [facility] ENDOSCOPY;  Service: Gastroenterology;  Laterality: N/A;  . EXTERNAL FIXATION WRIST FRACTURE Left   . left arm metal plate    . LEFT HEART CATH AND CORONARY ANGIOGRAPHY Left 11/04/2016   Procedure: Left Heart Cath and Coronary Angiography;  Surgeon: Isaias Cowman, MD;  Location: [facility] CV LAB;  Service: Cardiovascular;  Laterality: Left;  . Left shoulder surgery    . PEG PLACEMENT Left 08/28/2018   Procedure: PERCUTANEOUS ENDOSCOPIC GASTROSTOMY (PEG) PLACEMENT;  Surgeon: Lin Landsman, MD;  Location: [facility] ENDOSCOPY;  Service: Gastroenterology;  Laterality: Left;  . Stent times 3  2013   Cardiac  . TOTAL ELBOW REPLACEMENT  Left   . VASECTOMY    . VENTRICULOPERITONEAL SHUNT      Prior to Admission medications   Medication Sig Start Date End Date Taking? Authorizing Provider  ascorbic acid (VITAMIN C) 250 MG tablet Take 250 mg by mouth 2 (two) times daily.   Yes [provider]  aspirin 81 MG chewable tablet Place 1 tablet (81 mg total) into feeding tube daily. 09/07/18  Yes Sudini, Alveta Heimlich, MD  atorvastatin (LIPITOR) 80 MG tablet Take 80 mg by mouth daily. 11/21/18 11/22/19 Yes [provider]  bisacodyl (DULCOLAX) 10 MG suppository Place 10 mg rectally daily. 11/21/18 12/01/18 Yes [provider]  cholecalciferol (D-VI-SOL) 10 MCG/ML LIQD Place 2.5 mLs (1,000 Units total) into feeding tube daily. 09/07/18  Yes Sudini, Alveta Heimlich, MD  clopidogrel (PLAVIX) 75 MG tablet Place 1 tablet (75 mg total) into feeding tube daily. 09/07/18  Yes Sudini, Alveta Heimlich, MD  donepezil (ARICEPT) 10 MG tablet Place 1 tablet (10 mg total) into feeding tube 2 (two) times daily. 09/06/18  Yes Sudini, Alveta Heimlich, MD  doxazosin (CARDURA) 2 MG tablet Take 2 mg by mouth Nightly. 11/21/18 11/21/19 Yes [provider]  folic acid (FOLVITE) 1 MG tablet Take 1 mg by mouth daily. 11/22/18 11/22/19 Yes [provider]  furosemide (LASIX) 40 MG tablet Take 40 mg by mouth daily. 09/25/18  Yes [provider]  gabapentin (NEURONTIN) 100 MG capsule Take 100-200 mg by mouth 3 (three) times daily. 09/21/18  Yes [provider]  insulin aspart (NOVOLOG) 100 UNIT/ML injection Inject 3 Units into the skin every 6 (six) hours. 09/06/18  Yes Sudini, Alveta Heimlich, MD  insulin glargine (LANTUS) 100 UNIT/ML injection Inject 28 Units into the skin daily. 11/21/18 11/21/19 Yes [provider]  isosorbide mononitrate (IMDUR) 30 MG 24 hr tablet Take 30 mg by mouth 2 (two) times daily. 09/21/18  Yes [provider]  lacosamide (VIMPAT) 50 MG TABS tablet Take 100 mg by mouth 2 (two) times daily. 11/21/18 11/21/19 Yes [provider]  lidocaine (LIDODERM) 5 % Place 1 patch onto the skin daily. Remove & Discard patch within 12 hours or as directed by MD   Yes [provider]  losartan (COZAAR) 100 MG tablet Take 100 mg by mouth daily. 09/25/18  Yes [provider]  Melatonin 3 MG TABS Take 6 mg by mouth Nightly. 11/21/18  Yes [provider]  memantine (NAMENDA) 10 MG tablet Place 1 tablet (10 mg total) into feeding tube 2 (two) times daily. 09/06/18  Yes Hillary Bow, MD  metoprolol tartrate (LOPRESSOR) 25 MG tablet Place 1 tablet (25 mg total) into feeding tube 2 (two) times daily. 09/06/18  Yes Sudini, Alveta Heimlich, MD  Multiple Vitamin (MULTIVITAMIN) LIQD Take 30 mLs by mouth daily.   Yes [provider]  nystatin (MYCOSTATIN) 100000 UNIT/ML suspension Take 5 mLs (500,000 Units total) by mouth 4 (four) times daily. 10/08/18  Yes Nance Pear, MD  pantoprazole sodium (PROTONIX) 40 mg/20 mL PACK Place 20 mLs (40 mg total) into feeding tube daily. 09/07/18  Yes Hillary Bow, MD  potassium chloride (KLOR-CON) 20 MEQ packet Place 20  mEq into feeding tube daily. 09/06/18  Yes Sudini, Alveta Heimlich, MD  senna-docusate (SENOKOT-S) 8.6-50 MG tablet Place 2 tablets into feeding tube Nightly. 11/21/18  Yes [provider]  Water For Irrigation, Sterile (FREE WATER) SOLN Place 180 mLs into feeding tube every 4 (four) hours. 09/06/18  Yes Sudini, Alveta Heimlich, MD  acetaminophen (TYLENOL) 500 MG tablet Place 1 tablet (500 mg total) into feeding tube every 6 (six) hours as needed for mild pain. 09/06/18   Hillary Bow, MD  Carbomer Gel Base (HYDROGEL) GEL Apply 1 application topically daily.    [provider]  loperamide (IMODIUM) 2 MG capsule Take 2 mg by mouth as needed for diarrhea or loose stools.    [provider]  metoCLOPramide (REGLAN) 5 MG tablet Place 1 tablet (5 mg total) into feeding tube 4 (four) times daily -  before meals and at bedtime. 09/29/18   Fritzi Mandes, MD   Nutritional Supplements (FEEDING SUPPLEMENT, GLUCERNA 1.5 CAL,) LIQD Place 1,000 mLs into feeding tube continuous. 09/06/18   Sudini, Srikar, MD  sodium chloride 0.9 % infusion Inject 500 mLs into the vein daily. Please infuse over 2 hours 10/08/18   Nance Pear, MD    Allergies Hydrocodone; Morphine; Quetiapine; Ambien [zolpidem]; Brilinta [ticagrelor]; Flexeril [cyclobenzaprine]; Flunitrazepam; Haldol [haloperidol lactate]; Levetiracetam; Lorazepam; Risperdal [risperidone]; Trazodone; Benadryl [diphenhydramine hcl (sleep)]; Penicillins; and Vancomycin  Family History  Problem Relation Age of Onset  . Diabetes Mother   . Hypertension Mother   . CAD Mother   . Hyperlipidemia Mother   . Stroke Mother   . ALS Mother   . Alzheimer's disease Father   . Diabetes Father   . Heart Problems Brother     Social History Social History   Tobacco Use  . Smoking status: Former Smoker    Last attempt to quit: 2013    Years since quitting: 7.2  . Smokeless tobacco: Never Used  . Tobacco comment: used to smoke 2PD for 40 yrs, quit about 4 years ago  Substance Use Topics  . Alcohol use: No    Alcohol/week: 0.0 standard drinks    Comment: occasional  . Drug use: No    Review of Systems  Constitutional: + fever. Respiratory: + shortness of breath, cough  ____________________________________________   PHYSICAL EXAM:  VITAL SIGNS: ED Triage Vitals  Enc Vitals Group     BP 11/24/18 1310 (!) 150/87     Pulse Rate 11/24/18 1310 (!) 132     Resp 11/24/18 1310 (!) 8     Temp 11/24/18 1310 (!) 97.5 F (36.4 C)     Temp Source 11/24/18 1310 Axillary     SpO2 11/24/18 1310 100 %     Weight 11/24/18 1400 125 lb 10.6 oz (57 kg)     Height 11/24/18 1400 5\' 9"  (1.753 m)     Head Circumference --      Peak Flow --      Pain Score --      Pain Loc --      Pain Edu? --      Excl. in [place]? --     Constitutional: Unresponsive, GCS 3 HEENT:      Head: Normocephalic and atraumatic.          Eyes: Pupil sluggish but responsive      Mouth/Throat: Mucous membranes are dry.       Neck: Supple with no signs of meningismus. Cardiovascular: Tachycardic with regular rhythm. Respiratory: Tachypneic, hypoxic, moderate increased WOB Gastrointestinal: Soft and  non distended with positive bowel sounds.  Musculoskeletal:  No edema, cyanosis, or erythema of extremities. Neurologic: GCS3 Skin: Skin is warm, dry and intact. No rash noted.  ____________________________________________   LABS (all labs ordered are listed, but only abnormal results are displayed)  Labs Reviewed  LACTIC ACID, PLASMA - Abnormal; Notable for the following components:      Result Value   Lactic Acid, Venous 2.4 (*)    All other components within normal limits  COMPREHENSIVE METABOLIC PANEL - Abnormal; Notable for the following components:   Glucose, Bld 405 (*)    Creatinine, Ser 0.60 (*)    Calcium 8.2 (*)    Albumin 2.5 (*)    Alkaline Phosphatase 136 (*)    All other components within normal limits  CBC WITH DIFFERENTIAL/PLATELET - Abnormal; Notable for the following components:   WBC 12.8 (*)    Hemoglobin 12.2 (*)    RDW 16.8 (*)    Neutro Abs 10.4 (*)    Monocytes Absolute 1.3 (*)    Abs Immature Granulocytes 0.08 (*)    All other components within normal limits  URINALYSIS, ROUTINE W REFLEX MICROSCOPIC - Abnormal; Notable for the following components:   Color, Urine AMBER (*)    APPearance CLOUDY (*)    Glucose, UA >=500 (*)    Hgb urine dipstick LARGE (*)    Protein, ur 100 (*)    Leukocytes,Ua SMALL (*)    RBC / HPF >50 (*)    WBC, UA >50 (*)    Bacteria, UA RARE (*)    All other components within normal limits  BLOOD GAS, ARTERIAL - Abnormal; Notable for the following components:   pCO2 arterial 56 (*)    pO2, Arterial 162 (*)    Bicarbonate 30.9 (*)    Acid-Base Excess 3.8 (*)    All other components within normal limits  GLUCOSE, CAPILLARY - Abnormal; Notable for the following  components:   Glucose-Capillary 283 (*)    All other components within normal limits  BASIC METABOLIC PANEL - Abnormal; Notable for the following components:   Sodium 146 (*)    Potassium 3.2 (*)    Chloride 118 (*)    CO2 20 (*)    Glucose, Bld 196 (*)    Creatinine, Ser 0.55 (*)    Calcium 7.3 (*)    All other components within normal limits  CBC - Abnormal; Notable for the following components:   WBC 16.4 (*)    RBC 3.67 (*)    Hemoglobin 10.7 (*)    HCT 35.1 (*)    RDW 16.8 (*)    All other components within normal limits  BLOOD GAS, ARTERIAL - Abnormal; Notable for the following components:   Acid-Base Excess 4.2 (*)    All other components within normal limits  GLUCOSE, CAPILLARY - Abnormal; Notable for the following components:   Glucose-Capillary 167 (*)    All other components within normal limits  GLUCOSE, CAPILLARY - Abnormal; Notable for the following components:   Glucose-Capillary 149 (*)    All other components within normal limits  CULTURE, BLOOD (ROUTINE X 2)  CULTURE, BLOOD (ROUTINE X 2)  MRSA PCR SCREENING  NOVEL CORONAVIRUS, NAA (HOSPITAL ORDER, SEND-OUT TO REF LAB)  LACTIC ACID, PLASMA  INFLUENZA PANEL BY PCR (TYPE A & B)  TROPONIN I  PROCALCITONIN  MAGNESIUM  PHOSPHORUS  BLOOD GAS, ARTERIAL   ____________________________________________  EKG  ED ECG REPORT I, Rudene Re, the attending physician, personally viewed  and interpreted this ECG.  Sinus tachycardia, rate of 113, normal intervals, normal axis, no ST elevations or depressions.  Otherwise normal EKG. ____________________________________________  RADIOLOGY  I have personally reviewed the images performed during this visit and I agree with the Radiologist's read.   Interpretation by Radiologist:  Dg Chest Portable 1 View  Result Date: 11/24/2018 CLINICAL DATA:  63 year old male with a history of intubation. EXAM: PORTABLE CHEST 1 VIEW COMPARISON:  10/24/2018, 10/07/2018, CT  10/24/2018 FINDINGS: Cardiomediastinal silhouette unchanged in size and contour. Low lung volumes with endotracheal tube terminating 3.6 cm above the carina. Gastric tube terminates out of the field of view within the abdomen. Mixed interstitial and airspace opacities, with reticulonodular pattern of opacity, worse from the comparison plain film. Blunting of the right costophrenic angle. No visualized pneumothorax. IMPRESSION: Endotracheal tube terminates suitably above the carina. Low lung volumes with mixed interstitial and airspace opacities, concerning for multifocal pneumonia. Gastric tube terminates out of the field of view. Electronically Signed   By: Corrie Mckusick D.O.   On: 11/24/2018 14:43      ____________________________________________   PROCEDURES  Procedure(s) performed:yes Procedure Name: Intubation Date/Time: 11/24/2018 2:16 PM Performed by: Rudene Re, MD Pre-anesthesia Checklist: Patient identified, Emergency Drugs available, Suction available and Patient being monitored Oxygen Delivery Method: Non-rebreather mask Preoxygenation: Pre-oxygenation with 100% oxygen Induction Type: IV induction and Rapid sequence Laryngoscope Size: Glidescope Tube size: 7.5 mm Number of attempts: 1 Airway Equipment and Method: Video-laryngoscopy Placement Confirmation: ETT inserted through vocal cords under direct vision,  CO2 detector and Breath sounds checked- equal and bilateral Secured at: 23 cm Tube secured with: ETT holder Dental Injury: Teeth and Oropharynx as per pre-operative assessment       Critical Care performed: yes  CRITICAL CARE Performed by: Rudene Re  ?  Total critical care time: 40 min  Critical care time was exclusive of separately billable procedures and treating other patients.  Critical care was necessary to treat or prevent imminent or life-threatening deterioration.  Critical care was time spent personally by me on the following  activities: development of treatment plan with patient and/or surrogate as well as nursing, discussions with consultants, evaluation of patient's response to treatment, examination of patient, obtaining history from patient or surrogate, ordering and performing treatments and interventions, ordering and review of laboratory studies, ordering and review of radiographic studies, pulse oximetry and re-evaluation of patient's condition.  ____________________________________________   INITIAL IMPRESSION / ASSESSMENT AND PLAN / ED COURSE   63 y.o. male with history as listed below including recent subarachnoid hemorrhage who presents for cough and fever.  Patient arrives in significant respiratory distress, tachypneic, hypoxic requiring oxygen via nonrebreather.  Lung exam was deferred due to use of  PAPR. GCS of 3.  CODE STATUS confirmed via EMS and review of chart to be full code from recent admission a month ago.  Patient tachycardic and febrile.  Patient was intubated for airway protection and acute hypoxic respiratory failure.  Labs are pending.  Sepsis protocol was initiated.  Patient started on broad spectrum IV abx, IVF, flu and covid swabs pending, labs and CXR pending.    ED COURSE: Labs showing elevated white count, normal lactic, chest x-ray concerning for pneumonia.  Due to allergies to vancomycin patient was started on cefepime, azithromycin, and clindamycin for MRSA and aspiration coverage.  Patient was admitted to the ICU.   As part of my medical decision making, I reviewed the following data within the [place]  notes reviewed and incorporated, Labs reviewed , EKG interpreted , Old chart reviewed, Radiograph reviewed , Discussed with admitting physician , Notes from prior ED visits and  Controlled Substance Database    Pertinent labs & imaging results that were available during my care of the patient were reviewed by me and considered in my medical decision  making (see chart for details).    ____________________________________________   FINAL CLINICAL IMPRESSION(S) / ED DIAGNOSES  Final diagnoses:  Sepsis with acute hypoxic respiratory failure without septic shock, due to unspecified organism ([place])  Acute respiratory failure with hypoxia ([place])      NEW MEDICATIONS STARTED DURING THIS VISIT:  ED Discharge Orders    None       Note:  This document was prepared using Dragon voice recognition software and may include unintentional dictation errors.    Alfred Levins, [place], MD 11/25/18 [phone]

## 2018-11-24 NOTE — ED Notes (Signed)
ED TO INPATIENT HANDOFF REPORT  ED Nurse Name and Phone #: Jeremiah Tarpley [phone]  S Name/Age/Gender Maurice Patterson 63 y.o. male Room/Bed: ED01A/ED01A  Code Status   Code Status: Prior  Home/SNF/Other [facility] Unresponsive/ intubated Is this baseline? No  Triage Complete: Triage complete  Chief Complaint Fever   Triage Note Pt arrives via ems unresponsive with fever. MD present in room at this time to intubate patient   Allergies Allergies  Allergen Reactions  . Hydrocodone Anaphylaxis  . Morphine Other (See Comments)    Loss of memory  . Ambien [Zolpidem] Other (See Comments)    delirium    . Brilinta [Ticagrelor] Other (See Comments)    Stroke   . Flexeril [Cyclobenzaprine] Other (See Comments)    delerium   . Flunitrazepam Other (See Comments)    ROHYPNOL (hallucinations)  . Haldol [Haloperidol Lactate] Other (See Comments)    delerium   . Levetiracetam Diarrhea and Other (See Comments)    Unable to walk  . Lorazepam Hives  . Risperdal [Risperidone] Other (See Comments)    Delirium   . Trazodone Other (See Comments)    Delirium Can take in low doses   . Benadryl [Diphenhydramine Hcl (Sleep)] Rash  . Penicillins Rash    Mouth ulcers Has patient had a PCN reaction causing immediate rash, facial/tongue/throat swelling, SOB or lightheadedness with hypotension: Yes Has patient had a PCN reaction causing severe rash involving mucus membranes or skin necrosis: No Has patient had a PCN reaction that required hospitalization No Has patient had a PCN reaction occurring within the last 10 years: Yes If all of the above answers are "NO", then may proceed with Cephalosporin use.  . Vancomycin Rash    Rash around IV site during vancomycin infusion    Level of Care/Admitting Diagnosis ED Disposition    ED Disposition Condition [facility] Area: [place] [[id number]]  Level of Care: ICU [6]  Diagnosis: Acute respiratory  failure ([place]) [518.81.ICD-9-CM]  Admitting Physician: Saundra Shelling [[id number]]  Attending Physician: Saundra Shelling [[id number]]  Estimated length of stay: past midnight tomorrow  Certification:: I certify this patient will need inpatient services for at least 2 midnights  PT Class (Do Not Modify): Inpatient [101]  PT Acc Code (Do Not Modify): Private [1]       B Medical/Surgery History Past Medical History:  Diagnosis Date  . Acute MI ([place])   . Arthritis   . Back pain   . CAD (coronary artery disease)   . CHF (congestive heart failure) ([place])   . Chronic kidney disease   . Degenerative lumbar disc   . Dementia ([place])   . Diabetes mellitus without complication ([place])   . GERD (gastroesophageal reflux disease)   . Hernia of abdominal cavity   . Hyperlipemia   . Hypertension   . Malignant intraductal papillary mucinous tumor of pancreas ([place])   . Memory loss   . Pancreatitis   . Seizures ([place])    staring spells  . Shingles   . Stroke ([place]) 08/2017  . TIA (transient ischemic attack)    Past Surgical History:  Procedure Laterality Date  . CARDIAC CATHETERIZATION    . CORONARY ANGIOPLASTY    . CYSTOSCOPY WITH STENT PLACEMENT Right 05/13/2017   Procedure: CYSTOSCOPY WITH STENT PLACEMENT;  Surgeon: Nickie Retort, MD;  Location: [facility] ORS;  Service: Urology;  Laterality: Right;  . ERCP N/A 09/12/2015   Procedure: ENDOSCOPIC RETROGRADE CHOLANGIOPANCREATOGRAPHY (ERCP);  Surgeon:  Hulen Luster, MD;  Location: [facility] ENDOSCOPY;  Service: Gastroenterology;  Laterality: N/A;  . ERCP N/A 01/02/2016   Procedure: ENDOSCOPIC RETROGRADE CHOLANGIOPANCREATOGRAPHY (ERCP);  Surgeon: Hulen Luster, MD;  Location: [facility] ENDOSCOPY;  Service: Gastroenterology;  Laterality: N/A;  . EXTERNAL FIXATION WRIST FRACTURE Left   . left arm metal plate    . LEFT HEART CATH AND CORONARY ANGIOGRAPHY Left 11/04/2016   Procedure: Left Heart Cath and Coronary Angiography;  Surgeon: Isaias Cowman, MD;  Location: [facility];  Service: Cardiovascular;  Laterality: Left;  . Left shoulder surgery    . PEG PLACEMENT Left 08/28/2018   Procedure: PERCUTANEOUS ENDOSCOPIC GASTROSTOMY (PEG) PLACEMENT;  Surgeon: Lin Landsman, MD;  Location: [facility] ENDOSCOPY;  Service: Gastroenterology;  Laterality: Left;  . Stent times 3  2013   Cardiac  . TOTAL ELBOW REPLACEMENT Left   . VASECTOMY    . VENTRICULOPERITONEAL SHUNT       A IV Location/Drains/Wounds Patient Lines/Drains/Airways Status   Active Line/Drains/Airways    Name:   Placement date:   Placement time:   Site:   Days:   Peripheral IV 10/24/18 Right Forearm   10/24/18    0023    Forearm   31   Peripheral IV 10/24/18 Left Hand   10/24/18    0035    Hand   31   Peripheral IV 10/24/18 Right Hand   10/24/18    0054    Hand   31   Peripheral IV 11/24/18 Right   11/24/18    1300    -   less than 1   Peripheral IV 11/24/18 Right Forearm   11/24/18    1407    Forearm   less than 1   Peripheral IV 11/24/18 Right Hand   11/24/18    1351    Hand   less than 1   NG/OG Tube Orogastric 14 Fr. Right mouth Aucultation Documented cm marking at nare/ corner of mouth 64 cm   10/24/18    0258    Right mouth   31   NG/OG Tube Orogastric 16 Fr. Right mouth Aucultation;Xray Documented cm marking at nare/ corner of mouth 53 cm   11/24/18    1403    Right mouth   less than 1   Gastrostomy/Enterostomy Percutaneous endoscopic gastrostomy (PEG) LUQ   09/20/18    1733    LUQ   65   Urethral Catheter Cole,RN Temperature probe 16 Fr.   10/24/18    0137    Temperature probe   31   Airway 8 mm   11/24/18    1315     less than 1   Pressure Injury 09/23/18 Deep Tissue Injury - Purple or maroon localized area of discolored intact skin or blood-filled blister due to damage of underlying soft tissue from pressure and/or shear.   09/23/18    1007     62          Intake/Output Last 24 hours No intake or output data in the 24 hours ending 11/24/18 1525  Labs/Imaging Results for  orders placed or performed during the hospital encounter of 11/24/18 (from the past 48 hour(s))  Lactic acid, plasma     Status: None   Collection Time: 11/24/18  1:14 PM  Result Value Ref Range   Lactic Acid, Venous 1.4 0.5 - 1.9 mmol/L    Comment: Performed at [facility], [address], [place], [place] [address]  Comprehensive  metabolic panel     Status: Abnormal   Collection Time: 11/24/18  1:14 PM  Result Value Ref Range   Sodium 143 135 - 145 mmol/L   Potassium 4.5 3.5 - 5.1 mmol/L    Comment: HEMOLYSIS AT THIS LEVEL MAY AFFECT RESULT   Chloride 107 98 - 111 mmol/L   CO2 29 22 - 32 mmol/L   Glucose, Bld 405 (H) 70 - 99 mg/dL   BUN 20 8 - 23 mg/dL   Creatinine, Ser 0.60 (L) 0.61 - 1.24 mg/dL   Calcium 8.2 (L) 8.9 - 10.3 mg/dL   Total Protein 6.7 6.5 - 8.1 g/dL   Albumin 2.5 (L) 3.5 - 5.0 g/dL   AST 27 15 - 41 U/L    Comment: HEMOLYSIS AT THIS LEVEL MAY AFFECT RESULT   ALT 14 0 - 44 U/L   Alkaline Phosphatase 136 (H) 38 - 126 U/L   Total Bilirubin 0.7 0.3 - 1.2 mg/dL    Comment: HEMOLYSIS AT THIS LEVEL MAY AFFECT RESULT   GFR calc non Af Amer >60 >60 mL/min   GFR calc Af Amer >60 >60 mL/min   Anion gap 7 5 - 15    Comment: Performed at [facility], [address] [place], [place] [address]  CBC WITH DIFFERENTIAL     Status: Abnormal   Collection Time: 11/24/18  1:14 PM  Result Value Ref Range   WBC 12.8 (H) 4.0 - 10.5 K/uL   RBC 4.22 4.22 - 5.81 MIL/uL   Hemoglobin 12.2 (L) 13.0 - 17.0 g/dL   HCT 39.8 39.0 - 52.0 %   MCV 94.3 80.0 - 100.0 fL   MCH 28.9 26.0 - 34.0 pg   MCHC 30.7 30.0 - 36.0 g/dL   RDW 16.8 (H) 11.5 - 15.5 %   Platelets 249 150 - 400 K/uL   nRBC 0.0 0.0 - 0.2 %   Neutrophils Relative % 81 %   Neutro Abs 10.4 (H) 1.7 - 7.7 K/uL   Lymphocytes Relative 8 %   Lymphs Abs 1.0 0.7 - 4.0 K/uL   Monocytes Relative 10 %   Monocytes Absolute 1.3 (H) 0.1 - 1.0 K/uL   Eosinophils Relative 0 %   Eosinophils Absolute 0.0 0.0 - 0.5 K/uL    Basophils Relative 0 %   Basophils Absolute 0.0 0.0 - 0.1 K/uL   Immature Granulocytes 1 %   Abs Immature Granulocytes 0.08 (H) 0.00 - 0.07 K/uL    Comment: Performed at [facility], [address] [place], [place] [address]  Troponin I - ONCE - STAT     Status: None   Collection Time: 11/24/18  1:14 PM  Result Value Ref Range   Troponin I <0.03 <0.03 ng/mL    Comment: Performed at [facility], [address] [place], [place] [address]  Blood gas, arterial     Status: Abnormal   Collection Time: 11/24/18  1:33 PM  Result Value Ref Range   pH, Arterial 7.35 7.350 - 7.450   pCO2 arterial 56 (H) 32.0 - 48.0 mmHg   pO2, Arterial 162 (H) 83.0 - 108.0 mmHg   Bicarbonate 30.9 (H) 20.0 - 28.0 mmol/L   Acid-Base Excess 3.8 (H) 0.0 - 2.0 mmol/L   O2 Saturation 99.4 %   Patient temperature 37.0    Collection site RIGHT RADIAL    Sample type ARTERIAL DRAW    Allens test (pass/fail) PASS PASS    Comment: Performed at [facility], [address] [place], [place] [address]  Urinalysis, Routine w reflex microscopic     Status: Abnormal   Collection Time: 11/24/18  2:42 PM  Result Value Ref Range   Color, Urine AMBER (A) YELLOW    Comment: BIOCHEMICALS MAY BE AFFECTED BY COLOR   APPearance CLOUDY (A) CLEAR   Specific Gravity, Urine 1.026 1.005 - 1.030   pH 6.0 5.0 - 8.0   Glucose, UA >=500 (A) NEGATIVE mg/dL   Hgb urine dipstick LARGE (A) NEGATIVE   Bilirubin Urine NEGATIVE NEGATIVE   Ketones, ur NEGATIVE NEGATIVE mg/dL   Protein, ur 100 (A) NEGATIVE mg/dL   Nitrite NEGATIVE NEGATIVE   Leukocytes,Ua SMALL (A) NEGATIVE   RBC / HPF >50 (H) 0 - 5 RBC/hpf   WBC, UA >50 (H) 0 - 5 WBC/hpf   Bacteria, UA RARE (A) NONE SEEN   Squamous Epithelial / LPF NONE SEEN 0 - 5   Mucus PRESENT    Budding Yeast PRESENT     Comment: Performed at [facility], [address] [place], [place] [address]   Dg Chest Portable 1 View  Result Date:  11/24/2018 CLINICAL DATA:  63 year old male with a history of intubation. EXAM: PORTABLE CHEST 1 VIEW COMPARISON:  10/24/2018, 10/07/2018, CT 10/24/2018 FINDINGS: Cardiomediastinal silhouette unchanged in size and contour. Low lung volumes with endotracheal tube terminating 3.6 cm above the carina. Gastric tube terminates out of the field of view within the abdomen. Mixed interstitial and airspace opacities, with reticulonodular pattern of opacity, worse from the comparison plain film. Blunting of the right costophrenic angle. No visualized pneumothorax. IMPRESSION: Endotracheal tube terminates suitably above the carina. Low lung volumes with mixed interstitial and airspace opacities, concerning for multifocal pneumonia. Gastric tube terminates out of the field of view. Electronically Signed   By: Corrie Mckusick D.O.   On: 11/24/2018 14:43    Pending Labs Unresulted Labs (From admission, onward)    Start     Ordered   11/24/18 1327  Lactic acid, plasma  Now then every 2 hours,   STAT     11/24/18 1327   11/24/18 1327  Blood Culture (routine x 2)  BLOOD CULTURE X 2,   STAT    Question:  Patient immune status  Answer:  Normal   11/24/18 1327   11/24/18 1327  Influenza panel by PCR (type A & B)  (Influenza PCR Panel)  Once,   STAT     11/24/18 1327   11/24/18 1327  Novel Coronavirus, NAA (hospital order; send-out to ref lab)  (Novel Coronavirus, NAA [facility] Order; send-out to ref lab) with precautions panel)  ONCE - STAT,   STAT    Question Answer Comment  Current symptoms Fever and Cough   Excluded other viral illnesses Yes   Exposure Risk None   Patient immune status Normal      11/24/18 1327   Signed and Held  CBC  (enoxaparin (LOVENOX)    CrCl >/= 30 ml/min)  Once,   R    Comments:  Baseline for enoxaparin therapy IF NOT ALREADY DRAWN.  Notify MD if PLT < 100 K.    Signed and Held   Signed and Held  Creatinine, serum  (enoxaparin (LOVENOX)    CrCl >/= 30 ml/min)  Once,   R    Comments:   Baseline for enoxaparin therapy IF NOT ALREADY DRAWN.    Signed and Held   Signed and Held  Creatinine, serum  (enoxaparin (LOVENOX)    CrCl >/=  30 ml/min)  Weekly,   R    Comments:  while on enoxaparin therapy    Signed and Held   Signed and Held  Basic metabolic panel  Tomorrow morning,   R     Signed and Held   Signed and Held  CBC  Tomorrow morning,   R     Signed and Held          Vitals/Pain Today's Vitals   11/24/18 1440 11/24/18 1445 11/24/18 1500 11/24/18 1515  BP: 105/75 106/70 92/65 (!) 85/62  Pulse: (!) 110 (!) 106 (!) 102 99  Resp: 20 20 (!) 6 (!) 22  Temp:      TempSrc:      SpO2: 100% 100% 100% 100%  Weight:      Height:        Isolation Precautions Airborne and Contact precautions  Medications Medications  clindamycin (CLEOCIN) IVPB 600 mg (has no administration in time range)  azithromycin (ZITHROMAX) 500 mg in sodium chloride 0.9 % 250 mL IVPB (has no administration in time range)  ipratropium-albuterol (DUONEB) 0.5-2.5 (3) MG/3ML nebulizer solution 3 mL (3 mLs Nebulization Not Given 11/24/18 1507)  ceFEPIme (MAXIPIME) 2 g in sodium chloride 0.9 % 100 mL IVPB (has no administration in time range)  insulin aspart (novoLOG) injection 0-20 Units (has no administration in time range)  insulin aspart (novoLOG) injection 0-5 Units (has no administration in time range)  sodium chloride 0.9 % bolus 2,000 mL (2,000 mLs Intravenous New Bag/Given 11/24/18 1306)  ceFEPIme (MAXIPIME) 1 g in sodium chloride 0.9 % 100 mL IVPB (1 g Intravenous New Bag/Given 11/24/18 1412)  azithromycin (ZITHROMAX) 500 mg in sodium chloride 0.9 % 250 mL IVPB (500 mg Intravenous New Bag/Given 11/24/18 1408)    Mobility High fall risk   Focused Assessments    R

## 2018-11-24 NOTE — Consult Note (Signed)
Pharmacy Antibiotic Note  Maurice Patterson is a 63 y.o. male admitted on 11/24/2018 with HAP.  Pharmacy has been consulted for Cefepime dosing. Patient has already received a dose of Cefepime in the ED. Will need to schedule the next dose appropriately.   Plan: Cefepime 2 g every 8 hours.  Height: 5\' 9"  (175.3 cm) Weight: 125 lb 10.6 oz (57 kg) IBW/kg (Calculated) : 70.7  Temp (24hrs), Avg:97.5 F (36.4 C), Min:97.5 F (36.4 C), Max:97.5 F (36.4 C)  Recent Labs  Lab 11/24/18 1314  WBC 12.8*  CREATININE 0.60*  LATICACIDVEN 1.4    Estimated Creatinine Clearance: 78.2 mL/min (A) (by C-G formula based on SCr of 0.6 mg/dL (L)).    Allergies  Allergen Reactions  . Hydrocodone Anaphylaxis  . Morphine Other (See Comments)    Loss of memory  . Ambien [Zolpidem] Other (See Comments)    delirium    . Brilinta [Ticagrelor] Other (See Comments)    Stroke   . Flexeril [Cyclobenzaprine] Other (See Comments)    delerium   . Flunitrazepam Other (See Comments)    ROHYPNOL (hallucinations)  . Haldol [Haloperidol Lactate] Other (See Comments)    delerium   . Levetiracetam Diarrhea and Other (See Comments)    Unable to walk  . Lorazepam Hives  . Risperdal [Risperidone] Other (See Comments)    Delirium   . Trazodone Other (See Comments)    Delirium Can take in low doses   . Benadryl [Diphenhydramine Hcl (Sleep)] Rash  . Penicillins Rash    Mouth ulcers Has patient had a PCN reaction causing immediate rash, facial/tongue/throat swelling, SOB or lightheadedness with hypotension: Yes Has patient had a PCN reaction causing severe rash involving mucus membranes or skin necrosis: No Has patient had a PCN reaction that required hospitalization No Has patient had a PCN reaction occurring within the last 10 years: Yes If all of the above answers are "NO", then may proceed with Cephalosporin use.  . Vancomycin Rash    Rash around IV site during vancomycin infusion    Antimicrobials  this admission: Azithromycin 4/3>>  Clindamycin 4/3 >>  Cefepime 4/3 >>  Dose adjustments this admission: N/A  Microbiology results: 4/3 BCx: pending 4/3 COVID: pending   Thank you for allowing pharmacy to be a part of this patient's care.  Rowland Lathe, PharmD 11/24/2018 3:06 PM

## 2018-11-24 NOTE — Progress Notes (Signed)
Inpatient Diabetes Program Recommendations  AACE/ADA: New Consensus Statement on Inpatient Glycemic Control (2015)  Target Ranges:  Prepandial:   less than 140 mg/dL      Peak postprandial:   less than 180 mg/dL (1-2 hours)      Critically ill patients:  140 - 180 mg/dL   Results for Maurice Patterson, Maurice Patterson (MRN [id number]) as of 11/24/2018 15:54  Ref. Range 11/24/2018 13:14  Glucose Latest Ref Range: 70 - 99 mg/dL 405 (H)    Admit with: Fever/ Unresponsive/ Resp Failure/ Rule Out COVID  History: DM, Dementia, CHF, CVA, CKD  Home DM Meds: Lantus 17 units QHS       Novolog 3 units Q6 hours  Current Orders: Novolog Resistant Correction Scale/ SSI (0-20 units) TID AC + HS      MD- Please consider the following insulin adjustments:  1. Start Lantus 17 units Daily (please start dose now)  2. Increase frequency of Novolog SSI to Q4 hours while patient Intubated       --Will follow patient during hospitalization--  Wyn Quaker RN, MSN, CDE Diabetes Coordinator Inpatient Glycemic Control Team Team Pager: [phone] (8a-5p)

## 2018-11-24 NOTE — ED Notes (Signed)
30 mg etomidate in at this time through left hand

## 2018-11-24 NOTE — ED Triage Notes (Signed)
Pt arrives via ems unresponsive with fever. MD present in room at this time to intubate patient

## 2018-11-24 NOTE — Progress Notes (Signed)
CODE SEPSIS - PHARMACY COMMUNICATION  **Broad Spectrum Antibiotics should be administered within 1 hour of Sepsis diagnosis**  Time Code Sepsis Called/Page Received: @1326   Antibiotics Ordered: Cefepime, clindamycin, azithromycin  Time of 1st antibiotic administration: 1408  Additional action taken by pharmacy: Around 1400, attempted to find nurse responsible for the patient, but at that time the nurse was in the patient's room with the antibiotics.    Rowland Lathe ,PharmD Clinical Pharmacist  11/24/2018  2:09 PM

## 2018-11-24 NOTE — H&P (Signed)
[facility] NAME: Maurice Patterson    MR#:  [id number]  DATE OF BIRTH:  09/29/1955  DATE OF ADMISSION:  11/24/2018  PRIMARY CARE PHYSICIAN: [name], MD   REQUESTING/REFERRING PHYSICIAN:   CHIEF COMPLAINT:   Chief Complaint  Patient presents with  . Fever    unresponsive    HISTORY OF PRESENT ILLNESS: Maurice Patterson  is a 63 y.o. male with a known history of coronary artery disease, chronic congestive heart failure, chronic kidney disease stage III, type 2 diabetes mellitus, GERD, pancreatic cancer, seizure disorder, stroke was brought from the facility for decreased responsiveness and fever.  Patient was unresponsive with fever when he presented to the emergency room.  He was electively intubated and put on ventilator by ER physician.  Not much history could be obtained from the patient.  According to the ER physician patient was short of breath was hypoxic and had to be put on ventilator.  Flu test was sent and coronavirus COVID-19 test was also sent.  Patient was put on precautions that is contact and airborne in the emergency room.  PAST MEDICAL HISTORY:   Past Medical History:  Diagnosis Date  . Acute MI ([place])   . Arthritis   . Back pain   . CAD (coronary artery disease)   . CHF (congestive heart failure) ([place])   . Chronic kidney disease   . Degenerative lumbar disc   . Dementia ([place])   . Diabetes mellitus without complication ([place])   . GERD (gastroesophageal reflux disease)   . Hernia of abdominal cavity   . Hyperlipemia   . Hypertension   . Malignant intraductal papillary mucinous tumor of pancreas ([place])   . Memory loss   . Pancreatitis   . Seizures ([place])    staring spells  . Shingles   . Stroke ([place]) 08/2017  . TIA (transient ischemic attack)     PAST SURGICAL HISTORY:  Past Surgical History:  Procedure Laterality Date  . CARDIAC CATHETERIZATION    . CORONARY ANGIOPLASTY    . CYSTOSCOPY WITH  STENT PLACEMENT Right 05/13/2017   Procedure: CYSTOSCOPY WITH STENT PLACEMENT;  Surgeon: Nickie Retort, MD;  Location: [facility] ORS;  Service: Urology;  Laterality: Right;  . ERCP N/A 09/12/2015   Procedure: ENDOSCOPIC RETROGRADE CHOLANGIOPANCREATOGRAPHY (ERCP);  Surgeon: Hulen Luster, MD;  Location: [facility] ENDOSCOPY;  Service: Gastroenterology;  Laterality: N/A;  . ERCP N/A 01/02/2016   Procedure: ENDOSCOPIC RETROGRADE CHOLANGIOPANCREATOGRAPHY (ERCP);  Surgeon: Hulen Luster, MD;  Location: [facility] ENDOSCOPY;  Service: Gastroenterology;  Laterality: N/A;  . EXTERNAL FIXATION WRIST FRACTURE Left   . left arm metal plate    . LEFT HEART CATH AND CORONARY ANGIOGRAPHY Left 11/04/2016   Procedure: Left Heart Cath and Coronary Angiography;  Surgeon: Isaias Cowman, MD;  Location: [facility] LAB;  Service: Cardiovascular;  Laterality: Left;  . Left shoulder surgery    . PEG PLACEMENT Left 08/28/2018   Procedure: PERCUTANEOUS ENDOSCOPIC GASTROSTOMY (PEG) PLACEMENT;  Surgeon: Lin Landsman, MD;  Location: [facility] ENDOSCOPY;  Service: Gastroenterology;  Laterality: Left;  . Stent times 3  2013   Cardiac  . TOTAL ELBOW REPLACEMENT Left   . VASECTOMY    . VENTRICULOPERITONEAL SHUNT      SOCIAL HISTORY:  Social History   Tobacco Use  . Smoking status: Former Smoker    Last attempt to quit: 2013    Years since quitting: 7.2  . Smokeless tobacco: Never  Used  . Tobacco comment: used to smoke 2PD for 40 yrs, quit about 4 years ago  Substance Use Topics  . Alcohol use: No    Alcohol/week: 0.0 standard drinks    Comment: occasional    FAMILY HISTORY:  Family History  Problem Relation Age of Onset  . Diabetes Mother   . Hypertension Mother   . CAD Mother   . Hyperlipidemia Mother   . Stroke Mother   . ALS Mother   . Alzheimer's disease Father   . Diabetes Father   . Heart Problems Brother     DRUG ALLERGIES:  Allergies  Allergen Reactions  . Hydrocodone Anaphylaxis  . Morphine Other  (See Comments)    Loss of memory  . Ambien [Zolpidem] Other (See Comments)    delirium    . Brilinta [Ticagrelor] Other (See Comments)    Stroke   . Flexeril [Cyclobenzaprine] Other (See Comments)    delerium   . Flunitrazepam Other (See Comments)    ROHYPNOL (hallucinations)  . Haldol [Haloperidol Lactate] Other (See Comments)    delerium   . Levetiracetam Diarrhea and Other (See Comments)    Unable to walk  . Lorazepam Hives  . Risperdal [Risperidone] Other (See Comments)    Delirium   . Trazodone Other (See Comments)    Delirium Can take in low doses   . Benadryl [Diphenhydramine Hcl (Sleep)] Rash  . Penicillins Rash    Mouth ulcers Has patient had a PCN reaction causing immediate rash, facial/tongue/throat swelling, SOB or lightheadedness with hypotension: Yes Has patient had a PCN reaction causing severe rash involving mucus membranes or skin necrosis: No Has patient had a PCN reaction that required hospitalization No Has patient had a PCN reaction occurring within the last 10 years: Yes If all of the above answers are "NO", then may proceed with Cephalosporin use.  . Vancomycin Rash    Rash around IV site during vancomycin infusion    REVIEW OF SYSTEMS:  Could not be obtained as patient is on ventilator  MEDICATIONS AT HOME:  Prior to Admission medications   Medication Sig Start Date End Date Taking? Authorizing Provider  acetaminophen (TYLENOL) 500 MG tablet Place 1 tablet (500 mg total) into feeding tube every 6 (six) hours as needed for mild pain. 09/06/18   Hillary Bow, MD  albuterol (PROVENTIL) (2.5 MG/3ML) 0.083% nebulizer solution Take 3 mLs (2.5 mg total) by nebulization every 3 (three) hours as needed for wheezing or shortness of breath. 09/06/18   Hillary Bow, MD  ascorbic acid (VITAMIN C) 250 MG tablet Take 250 mg by mouth 2 (two) times daily.    [provider]  aspirin 81 MG chewable tablet Place 1 tablet (81 mg total) into feeding tube  daily. 09/07/18   Hillary Bow, MD  cholecalciferol (D-VI-SOL) 10 MCG/ML LIQD Place 2.5 mLs (1,000 Units total) into feeding tube daily. 09/07/18   Hillary Bow, MD  clopidogrel (PLAVIX) 75 MG tablet Place 1 tablet (75 mg total) into feeding tube daily. 09/07/18   Hillary Bow, MD  donepezil (ARICEPT) 10 MG tablet Place 1 tablet (10 mg total) into feeding tube 2 (two) times daily. 09/06/18   Hillary Bow, MD  insulin aspart (NOVOLOG) 100 UNIT/ML injection Inject 3 Units into the skin every 6 (six) hours. 09/06/18   Hillary Bow, MD  Insulin Glargine (LANTUS SOLOSTAR) 100 UNIT/ML Solostar Pen Inject 4 Units into the skin at bedtime. Patient taking differently: Inject 17 Units into the skin at bedtime.  09/29/18   Fritzi Mandes, MD  loperamide (IMODIUM) 2 MG capsule Take 2 mg by mouth as needed for diarrhea or loose stools.    [provider]  memantine (NAMENDA) 10 MG tablet Place 1 tablet (10 mg total) into feeding tube 2 (two) times daily. 09/06/18   Hillary Bow, MD  metoCLOPramide (REGLAN) 5 MG tablet Place 1 tablet (5 mg total) into feeding tube 4 (four) times daily -  before meals and at bedtime. 09/29/18   Fritzi Mandes, MD  metoprolol tartrate (LOPRESSOR) 25 MG tablet Place 1 tablet (25 mg total) into feeding tube 2 (two) times daily. 09/06/18   Hillary Bow, MD  Multiple Vitamin (MULTIVITAMIN) LIQD Take 30 mLs by mouth daily.    [provider]  Nutritional Supplements (FEEDING SUPPLEMENT, GLUCERNA 1.5 CAL,) LIQD Place 1,000 mLs into feeding tube continuous. 09/06/18   Hillary Bow, MD  nystatin (MYCOSTATIN) 100000 UNIT/ML suspension Take 5 mLs (500,000 Units total) by mouth 4 (four) times daily. 10/08/18   Nance Pear, MD  pantoprazole sodium (PROTONIX) 40 mg/20 mL PACK Place 20 mLs (40 mg total) into feeding tube daily. 09/07/18   Hillary Bow, MD  potassium chloride (KLOR-CON) 20 MEQ packet Place 20 mEq into feeding tube daily. 09/06/18   Sudini, Srikar, MD  sodium  chloride 0.9 % infusion Inject 500 mLs into the vein daily. Please infuse over 2 hours 10/08/18   Nance Pear, MD  Water For Irrigation, Sterile (FREE WATER) SOLN Place 180 mLs into feeding tube every 4 (four) hours. 09/06/18   Hillary Bow, MD      PHYSICAL EXAMINATION:   VITAL SIGNS: Blood pressure 106/70, pulse (!) 106, temperature (!) 97.5 F (36.4 C), temperature source Axillary, resp. rate 20, height 5\' 9"  (1.753 m), weight 57 kg, SpO2 100 %.  GENERAL:  63 y.o.-year-old patient lying in the bed on ventilator EYES: Pupils equal, round, reactive to light and accommodation. No scleral icterus. Extraocular muscles intact.  HEENT: Head atraumatic, normocephalic. Oropharynx and nasopharynx clear.  NECK:  Supple, no jugular venous distention. No thyroid enlargement, no tenderness.  LUNGS: Decreased breath sounds bilaterally, bilateral rales heard. On ventilator Tidal volume 500 PEEP 5 FiO2 60% Rate 12 CARDIOVASCULAR: S1, S2 normal. No murmurs, rubs, or gallops.  ABDOMEN: Soft, nontender, nondistended. Bowel sounds present. No organomegaly or mass.  EXTREMITIES: No pedal edema, cyanosis, or clubbing.  NEUROLOGIC: Not be assessed as patient is on ventilator PSYCHIATRIC: The patient is alert and oriented x none SKIN: No obvious rash, lesion, or ulcer.   LABORATORY PANEL:   CBC Recent Labs  Lab 11/24/18 1314  WBC 12.8*  HGB 12.2*  HCT 39.8  PLT 249  MCV 94.3  MCH 28.9  MCHC 30.7  RDW 16.8*  LYMPHSABS 1.0  MONOABS 1.3*  EOSABS 0.0  BASOSABS 0.0   ------------------------------------------------------------------------------------------------------------------  Chemistries  Recent Labs  Lab 11/24/18 1314  NA 143  K 4.5  CL 107  CO2 29  GLUCOSE 405*  BUN 20  CREATININE 0.60*  CALCIUM 8.2*  AST 27  ALT 14  ALKPHOS 136*  BILITOT 0.7    ------------------------------------------------------------------------------------------------------------------ estimated creatinine clearance is 78.2 mL/min (A) (by C-G formula based on SCr of 0.6 mg/dL (L)). ------------------------------------------------------------------------------------------------------------------ No results for input(s): TSH, T4TOTAL, T3FREE, THYROIDAB in the last 72 hours.  Invalid input(s): FREET3   Coagulation profile No results for input(s): INR, PROTIME in the last 168 hours. ------------------------------------------------------------------------------------------------------------------- No results for input(s): DDIMER in the last 72 hours. -------------------------------------------------------------------------------------------------------------------  Cardiac Enzymes Recent  Labs  Lab 11/24/18 1314  TROPONINI <0.03   ------------------------------------------------------------------------------------------------------------------ Invalid input(s): POCBNP  ---------------------------------------------------------------------------------------------------------------  Urinalysis    Component Value Date/Time   COLORURINE YELLOW (A) 10/24/2018 0110   APPEARANCEUR CLEAR (A) 10/24/2018 0110   APPEARANCEUR Clear 12/08/2017 1207   LABSPEC 1.035 (H) 10/24/2018 0110   LABSPEC 1.029 12/14/2014 1833   PHURINE 5.0 10/24/2018 0110   GLUCOSEU >=500 (A) 10/24/2018 0110   GLUCOSEU >=500 12/14/2014 1833   HGBUR NEGATIVE 10/24/2018 0110   BILIRUBINUR NEGATIVE 10/24/2018 0110   BILIRUBINUR Negative 12/08/2017 1207   BILIRUBINUR Negative 12/14/2014 1833   KETONESUR 5 (A) 10/24/2018 0110   PROTEINUR NEGATIVE 10/24/2018 0110   NITRITE NEGATIVE 10/24/2018 0110   LEUKOCYTESUR NEGATIVE 10/24/2018 0110   LEUKOCYTESUR Negative 12/14/2014 1833     RADIOLOGY: Dg Chest Portable 1 View  Result Date: 11/24/2018 CLINICAL DATA:  63 year old male with a  history of intubation. EXAM: PORTABLE CHEST 1 VIEW COMPARISON:  10/24/2018, 10/07/2018, CT 10/24/2018 FINDINGS: Cardiomediastinal silhouette unchanged in size and contour. Low lung volumes with endotracheal tube terminating 3.6 cm above the carina. Gastric tube terminates out of the field of view within the abdomen. Mixed interstitial and airspace opacities, with reticulonodular pattern of opacity, worse from the comparison plain film. Blunting of the right costophrenic angle. No visualized pneumothorax. IMPRESSION: Endotracheal tube terminates suitably above the carina. Low lung volumes with mixed interstitial and airspace opacities, concerning for multifocal pneumonia. Gastric tube terminates out of the field of view. Electronically Signed   By: Corrie Mckusick D.O.   On: 11/24/2018 14:43    EKG: Orders placed or performed during the hospital encounter of 11/24/18  . ED EKG 12-Lead  . ED EKG 12-Lead  . EKG 12-Lead  . EKG 12-Lead    IMPRESSION AND PLAN: 63 yr old with a known history of coronary artery disease, chronic congestive heart failure, chronic kidney disease stage III, type 2 diabetes mellitus, GERD, pancreatic cancer, seizure disorder, stroke was brought from the facility for decreased responsiveness and fever.  -Acute respiratory failure with hypoxia Continue mechanical ventilation Admit patient to ICU Intensivist consult  -Multilobar pneumonia Start patient on IV cefepime, clindamycin and Zithromax antibiotics Healthcare associated pneumonia Patient allergic to vancomycin Check cultures Check for flu test, COVID-19 test Airborne and contact precautions Infectious disease consult Infection prevention team notified  -Acute encephalopathy could be secondary to pneumonia Supportive care  -Uncontrolled diabetes mellitus Sliding scale coverage with insulin  -DVT prophylaxis subcu Lovenox daily  All the records are reviewed and case discussed with ED provider. Management  plans discussed with the patient, family and they are in agreement.  CODE STATUS:Full code Code Status History    Date Active Date Inactive Code Status Order ID Comments User Context   09/19/2018 2130 09/29/2018 1424 Full Code [id number]  Lance Coon, MD ED   07/23/2018 0122 09/08/2018 2029 Full Code [id number]  Lance Coon, MD Inpatient   04/05/2016 0304 04/05/2016 1851 Full Code [id number]  Harvie Bridge, DO Inpatient   10/19/2015 0103 10/20/2015 1102 Full Code [id number]  Harrie Foreman, MD Inpatient   08/21/2015 1912 08/23/2015 1657 Full Code [id number]  Gladstone Lighter, MD Inpatient   02/19/2015 1627 02/20/2015 2054 Full Code [id number]  Henreitta Leber, MD Inpatient      TOTAL CRITICAL CARE TIME TAKING CARE OF THIS PATIENT: 53 minutes.    Saundra Shelling M.D on 11/24/2018 at 3:05 PM  Between 7am to 6pm - Pager - [phone]  After 6pm go to [email or web address] - password  [facility]  [facility]  Office  [phone]  CC: Primary care physician; [name], MD

## 2018-11-24 NOTE — Consult Note (Signed)
NAME: Maurice Patterson  DOB: 1956-05-02  MRN: [id number]  Date/Time: 11/24/2018 4:39 PM  REQUESTING PROVIDER:pyreddy Subjective:  REASON FOR CONSULT: COVID 19 suspect ? Maurice Patterson is a 63 y.o.male  with a history of CVA, non verbal. dementia Peg, NH resident, CAD, CHF, CKD admitted from  NH with fever , cough and hypoxia-As PER EMS pt has been unresponsive for a month since he had a cerebral bleed in [employer] . In the ED he was hypoxic with pulse ox of 70, started on NRB and then was intubated He was recently in [place] between 10/24/18 and weas discharged on 11/21/18 to [facility]. Pt had intraparenchymal left cerebral hemorrhage on 10/24/18 and was [facility] . HE was in ICU with septic shock , He never regained consciousness . Sacral wound   Past Medical History:  . Arthritis  . Back pain  . CAD (coronary artery disease)  . Cardiac murmur 02/24/2017  . CHF (congestive heart failure) (CMS-HCC)  . DIABETES MELLITUS  . GERD (gastroesophageal reflux disease)  . Hyperlipidemia  . Hypertension  . IPMN (intraductal papillary mucinous neoplasm)  main duct  . MI (myocardial infarction) (CMS-HCC)  2012 x 2  . Myocardial infarction (CMS-HCC)  . Pancreatitis 09/12/2015  . Stroke (CMS-HCC)  . Type 2 diabetes mellitus with diabetic polyneuropathy, with long-term current use of insulin (CMS-HCC)    Surgical history  CARDIAC CATHETERIZATION  . CORONARY ANGIOPLASTY  . CORONARY STENT PLACEMENT  . ERCP 09/12/2015  Pancreatitis/Temporary stent placed into the dorsal pancreatic duct/Repeat 4-6weeks/PYO  . ERCP 01/02/2016  Moderate dilatation of the ventral pancreatic duct in the head of the pancreas was found. An irregularity was found in the ventral pancreatic duct in the head of the pancreas. A pancreatic obstruction was found in the near genu of the pancreas/No Repeat/PYO  . JOINT REPLACEMENT  . LEFT ELBOW REPLACEMENT  . LEFT WRIST FIXATION  . pancreas splent placed  .  pancreas stent  . VASECTOMY  . VENTRICULOPERITONEAL SHUNT   Social History   Socioeconomic History  . Marital status: Married    Spouse name: Not on file  . Number of children: 2  . Years of education: BS  . Highest education level: Not on file  Occupational History  . Occupation: Disabled  Social Needs  . Financial resource strain: Not on file  . Food insecurity:    Worry: Not on file    Inability: Not on file  . Transportation needs:    Medical: Not on file    Non-medical: Not on file  Tobacco Use  . Smoking status: Former Smoker    Last attempt to quit: 2013    Years since quitting: 7.2  . Smokeless tobacco: Never Used  . Tobacco comment: used to smoke 2PD for 40 yrs, quit about 4 years ago  Substance and Sexual Activity  . Alcohol use: No    Alcohol/week: 0.0 standard drinks    Comment: occasional  . Drug use: No  . Sexual activity: Not on file  Lifestyle  . Physical activity:    Days per week: Not on file    Minutes per session: Not on file  . Stress: Not on file  Relationships  . Social connections:    Talks on phone: Not on file    Gets together: Not on file    Attends religious service: Not on file    Active member of club or organization: Not on file  Attends meetings of clubs or organizations: Not on file    Relationship status: Not on file  . Intimate partner violence:    Fear of current or ex partner: Not on file    Emotionally abused: Not on file    Physically abused: Not on file    Forced sexual activity: Not on file  Other Topics Concern  . Not on file  Social History Narrative   Lives at home with wife and son. Ambulatory at baseline.   Right-handed   Caffeine: 2 sodas per day    Family History  Problem Relation Age of Onset  . Diabetes Mother   . Hypertension Mother   . CAD Mother   . Hyperlipidemia Mother   . Stroke Mother   . ALS Mother   . Alzheimer's disease Father   . Diabetes Father   . Heart Problems Brother    Allergies   Allergen Reactions  . Hydrocodone Anaphylaxis  . Morphine Other (See Comments)    Loss of memory  . Ambien [Zolpidem] Other (See Comments)    delirium    . Brilinta [Ticagrelor] Other (See Comments)    Stroke   . Flexeril [Cyclobenzaprine] Other (See Comments)    delerium   . Flunitrazepam Other (See Comments)    ROHYPNOL (hallucinations)  . Haldol [Haloperidol Lactate] Other (See Comments)    delerium   . Levetiracetam Diarrhea and Other (See Comments)    Unable to walk  . Lorazepam Hives  . Risperdal [Risperidone] Other (See Comments)    Delirium   . Trazodone Other (See Comments)    Delirium Can take in low doses   . Benadryl [Diphenhydramine Hcl (Sleep)] Rash  . Penicillins Rash    Mouth ulcers Has patient had a PCN reaction causing immediate rash, facial/tongue/throat swelling, SOB or lightheadedness with hypotension: Yes Has patient had a PCN reaction causing severe rash involving mucus membranes or skin necrosis: No Has patient had a PCN reaction that required hospitalization No Has patient had a PCN reaction occurring within the last 10 years: Yes If all of the above answers are "NO", then may proceed with Cephalosporin use.  . Vancomycin Rash    Rash around IV site during vancomycin infusion  ? Current Facility-Administered Medications  Medication Dose Route Frequency Provider Last Rate Last Dose  . 0.9 %  sodium chloride infusion   Intravenous Continuous Pyreddy, Pavan, MD      . acetaminophen (TYLENOL) tablet 650 mg  650 mg Oral Q6H PRN Pyreddy, Reatha Harps, MD       Or  . acetaminophen (TYLENOL) suppository 650 mg  650 mg Rectal Q6H PRN Pyreddy, Reatha Harps, MD      . aspirin chewable tablet 81 mg  81 mg Per Tube Daily Pyreddy, Reatha Harps, MD      . Derrill Memo ON 11/25/2018] azithromycin (ZITHROMAX) 500 mg in sodium chloride 0.9 % 250 mL IVPB  500 mg Intravenous Q24H Pyreddy, Pavan, MD      . ceFEPIme (MAXIPIME) 2 g in sodium chloride 0.9 % 100 mL IVPB  2 g Intravenous Q8H  Duncan, Asajah R, RPH      . cholecalciferol (D-VI-SOL) 10 MCG/ML oral liquid 1,000 Units  1,000 Units Oral Daily Pyreddy, Pavan, MD      . clindamycin (CLEOCIN) IVPB 600 mg  600 mg Intravenous Q8H Pyreddy, Pavan, MD      . clopidogrel (PLAVIX) tablet 75 mg  75 mg Per Tube Daily Pyreddy, Pavan, MD      . donepezil (  ARICEPT) tablet 10 mg  10 mg Per Tube BID Pyreddy, Reatha Harps, MD      . enoxaparin (LOVENOX) injection 40 mg  40 mg Subcutaneous Q24H Pyreddy, Pavan, MD      . feeding supplement (GLUCERNA 1.5 CAL) liquid 1,000 mL  1,000 mL Per Tube Continuous Pyreddy, Pavan, MD      . free water 180 mL  180 mL Per Tube Q4H Pyreddy, Pavan, MD      . insulin aspart (novoLOG) injection 0-20 Units  0-20 Units Subcutaneous TID WC Pyreddy, Pavan, MD      . insulin aspart (novoLOG) injection 0-5 Units  0-5 Units Subcutaneous QHS Pyreddy, Pavan, MD      . ipratropium-albuterol (DUONEB) 0.5-2.5 (3) MG/3ML nebulizer solution 3 mL  3 mL Nebulization Q6H Pyreddy, Pavan, MD      . memantine (NAMENDA) tablet 10 mg  10 mg Per Tube BID Pyreddy, Reatha Harps, MD      . metoprolol tartrate (LOPRESSOR) tablet 25 mg  25 mg Per Tube BID Pyreddy, Pavan, MD      . norepinephrine (LEVOPHED) 4mg  in 21mL premix infusion  0-40 mcg/min Intravenous Titrated Pyreddy, Pavan, MD      . nystatin (MYCOSTATIN) 100000 UNIT/ML suspension 500,000 Units  5 mL Oral QID Pyreddy, Reatha Harps, MD      . ondansetron (ZOFRAN) tablet 4 mg  4 mg Oral Q6H PRN Pyreddy, Reatha Harps, MD       Or  . ondansetron (ZOFRAN) injection 4 mg  4 mg Intravenous Q6H PRN Pyreddy, Pavan, MD      . potassium chloride (KLOR-CON) packet 20 mEq  20 mEq Per Tube Daily Pyreddy, Pavan, MD      . vitamin C (ASCORBIC ACID) tablet 250 mg  250 mg Oral BID Pyreddy, Reatha Harps, MD         Abtx:  Anti-infectives (From admission, onward)   Start     Dose/Rate Route Frequency Ordered Stop   11/25/18 1400  azithromycin (ZITHROMAX) 500 mg in sodium chloride 0.9 % 250 mL IVPB     500 mg 250 mL/hr over  60 Minutes Intravenous Every 24 hours 11/24/18 1442     11/24/18 2200  ceFEPIme (MAXIPIME) 2 g in sodium chloride 0.9 % 100 mL IVPB     2 g 200 mL/hr over 30 Minutes Intravenous Every 8 hours 11/24/18 1514     11/24/18 1500  clindamycin (CLEOCIN) IVPB 600 mg     600 mg 100 mL/hr over 30 Minutes Intravenous Every 8 hours 11/24/18 1441     11/24/18 1330  ceFEPIme (MAXIPIME) 1 g in sodium chloride 0.9 % 100 mL IVPB     1 g 200 mL/hr over 30 Minutes Intravenous  Once 11/24/18 1327 11/24/18 1443   11/24/18 1330  vancomycin (VANCOCIN) IVPB 1000 mg/200 mL premix  Status:  Discontinued     1,000 mg 200 mL/hr over 60 Minutes Intravenous  Once 11/24/18 1327 11/24/18 1328   11/24/18 1330  metroNIDAZOLE (FLAGYL) IVPB 500 mg  Status:  Discontinued     500 mg 100 mL/hr over 60 Minutes Intravenous  Once 11/24/18 1328 11/24/18 1329   11/24/18 1330  azithromycin (ZITHROMAX) 500 mg in sodium chloride 0.9 % 250 mL IVPB     500 mg 250 mL/hr over 60 Minutes Intravenous  Once 11/24/18 1328 11/24/18 1601   11/24/18 1330  clindamycin (CLEOCIN) IVPB 600 mg  Status:  Discontinued     600 mg 100 mL/hr over 30 Minutes Intravenous  Once 11/24/18 1329  11/24/18 1454      REVIEW OF SYSTEMS:  NA Objective:  VITALS:  BP 104/70   Pulse 96   Temp 98.4 F (36.9 C) (Axillary)   Resp 20   Ht 5\' 6"  (1.676 m)   Wt 53.9 kg   SpO2 100%   BMI 19.18 kg/m  PHYSICAL EXAM:  General: unresponsive, intubated Contractures upper arm Head: Normocephalic, without obvious abnormality, atraumatic. Lungs: b/l air entry. Heart: s1s2 Abdomen:did not examine OG tube has bilous coffee ground secretions Extremities: atraumatic, no cyanosis. No edema. No clubbing Skin: limited examination Neurologic: cannot be examined Pertinent Labs Lab Results CBC    Component Value Date/Time   WBC 12.8 (H) 11/24/2018 1314   RBC 4.22 11/24/2018 1314   HGB 12.2 (L) 11/24/2018 1314   HGB 14.4 10/20/2016 0944   HCT 39.8 11/24/2018 1314    HCT 42.9 10/20/2016 0944   PLT 249 11/24/2018 1314   PLT 221 10/20/2016 0944   MCV 94.3 11/24/2018 1314   MCV 93 10/20/2016 0944   MCV 93 12/16/2014 0537   MCH 28.9 11/24/2018 1314   MCHC 30.7 11/24/2018 1314   RDW 16.8 (H) 11/24/2018 1314   RDW 14.1 10/20/2016 0944   RDW 13.5 12/16/2014 0537   LYMPHSABS 1.0 11/24/2018 1314   LYMPHSABS 2.4 10/20/2016 0944   LYMPHSABS 1.8 12/16/2014 0537   MONOABS 1.3 (H) 11/24/2018 1314   MONOABS 1.0 12/16/2014 0537   EOSABS 0.0 11/24/2018 1314   EOSABS 0.0 10/20/2016 0944   EOSABS 0.0 12/16/2014 0537   BASOSABS 0.0 11/24/2018 1314   BASOSABS 0.0 10/20/2016 0944   BASOSABS 0.0 12/16/2014 0537    CMP Latest Ref Rng & Units 11/24/2018 10/24/2018 10/07/2018  Glucose 70 - 99 mg/dL 405(H) 453(H) 292(H)  BUN 8 - 23 mg/dL 20 18 13   Creatinine 0.61 - 1.24 mg/dL 0.60(L) 0.51(L) 0.56(L)  Sodium 135 - 145 mmol/L 143 139 136  Potassium 3.5 - 5.1 mmol/L 4.5 4.2 4.3  Chloride 98 - 111 mmol/L 107 100 101  CO2 22 - 32 mmol/L 29 30 25   Calcium 8.9 - 10.3 mg/dL 8.2(L) 8.3(L) 8.9  Total Protein 6.5 - 8.1 g/dL 6.7 6.9 7.3  Total Bilirubin 0.3 - 1.2 mg/dL 0.7 0.5 0.9  Alkaline Phos 38 - 126 U/L 136(H) 136(H) 135(H)  AST 15 - 41 U/L 27 12(L) 14(L)  ALT 0 - 44 U/L 14 13 13       Microbiology: No results found for this or any previous visit (from the past 240 hour(s)).  IMAGING RESULTS:  I have personally reviewed the films ? Impression/Recommendation ? 63 y.o.male  with a history of CVA, non verbal. dementia Peg, NH resident, CAD, CHF, CKD admitted from  NH with fever , cough and hypoxia-was in [facility] between 3/3-3/31 and discharged to [facility] on 3/31  Acute hypoxic resp failure intubated  HCAP-/Aspiration pneumonia - agree with broad spectrum antibiotics- MRSA nares neg Cefepime/ clindamycin and azithromycin-  Less likely to be COVID 19- being checked   CVA recent intracerebral bleed? ? ___________________________________________________ Discussed with  his nurse

## 2018-11-24 NOTE — ED Notes (Signed)
80mg  of rocuronium pushed per MD verbal orders at this time

## 2018-11-24 NOTE — ED Notes (Addendum)
Report given to ICU at this time, resp contacted to transport

## 2018-11-24 NOTE — Progress Notes (Signed)
Spoke with pts wife via telephone updated her regarding plan of care and all questions answered.  Discussed code status she stated she would like the pt to remain a FULL CODE for now.  Palliative Care consulted to discuss goals of treatment.   Marda Stalker, [place] Pager [phone] (please enter 7 digits) PCCM Consult Pager [phone] (please enter 7 digits)

## 2018-11-25 LAB — BASIC METABOLIC PANEL
Anion gap: 8 (ref 5–15)
BUN: 21 mg/dL (ref 8–23)
CO2: 20 mmol/L — ABNORMAL LOW (ref 22–32)
Calcium: 7.3 mg/dL — ABNORMAL LOW (ref 8.9–10.3)
Chloride: 118 mmol/L — ABNORMAL HIGH (ref 98–111)
Creatinine, Ser: 0.55 mg/dL — ABNORMAL LOW (ref 0.61–1.24)
GFR calc Af Amer: >60 mL/min (ref >60)
GFR calc non Af Amer: >60 mL/min (ref >60)
Glucose, Bld: 196 mg/dL — ABNORMAL HIGH (ref 70–99)
Potassium: 3.2 mmol/L — ABNORMAL LOW (ref 3.5–5.1)
Sodium: 146 mmol/L — ABNORMAL HIGH (ref 135–145)

## 2018-11-25 LAB — GLUCOSE, CAPILLARY
Glucose-Capillary: 167 mg/dL — ABNORMAL HIGH (ref 70–99)
Glucose-Capillary: 149 mg/dL — ABNORMAL HIGH (ref 70–99)
Glucose-Capillary: 169 mg/dL — ABNORMAL HIGH (ref 70–99)
Glucose-Capillary: 220 mg/dL — ABNORMAL HIGH (ref 70–99)
Glucose-Capillary: 217 mg/dL — ABNORMAL HIGH (ref 70–99)

## 2018-11-25 LAB — CBC
HCT: 35.1 % — ABNORMAL LOW (ref 39.0–52.0)
Hemoglobin: 10.7 g/dL — ABNORMAL LOW (ref 13.0–17.0)
MCH: 29.2 pg (ref 26.0–34.0)
MCHC: 30.5 g/dL (ref 30.0–36.0)
MCV: 95.6 fL (ref 80.0–100.0)
Platelets: 269 10*3/uL (ref 150–400)
RBC: 3.67 MIL/uL — ABNORMAL LOW (ref 4.22–5.81)
RDW: 16.8 % — ABNORMAL HIGH (ref 11.5–15.5)
WBC: 16.4 10*3/uL — ABNORMAL HIGH (ref 4.0–10.5)
nRBC: 0.2 % (ref 0.0–0.2)

## 2018-11-25 LAB — BLOOD GAS, ARTERIAL
Acid-Base Excess: 0.3 mmol/L (ref 0.0–2.0)
Bicarbonate: 23.5 mmol/L (ref 20.0–28.0)
FIO2: 0.35
MECHVT: 500 mL
Mechanical Rate: 16
O2 Saturation: 96.7 %
PEEP: 5 cmH2O
Patient temperature: 38
pCO2 arterial: 33 mmHg (ref 32.0–48.0)
pH, Arterial: 7.44 (ref 7.350–7.450)
pO2, Arterial: 89 mmHg (ref 83.0–108.0)

## 2018-11-25 LAB — PHOSPHORUS: Phosphorus: 2.8 mg/dL (ref 2.5–4.6)

## 2018-11-25 LAB — MAGNESIUM: Magnesium: 1.8 mg/dL (ref 1.7–2.4)

## 2018-11-25 LAB — FERRITIN: Ferritin: 270 ng/mL (ref 24–336)

## 2018-11-25 MED ORDER — FENTANYL CITRATE (PF) 100 MCG/2ML IJ SOLN
100.0000 ug | INTRAMUSCULAR | Status: DC | PRN
  Filled 2018-11-25: qty 2

## 2018-11-25 MED ORDER — GLUCERNA 1.5 CAL PO LIQD
1000.0000 mL | ORAL | Status: DC
  Administered 2018-11-26: 05:00:00 1000 mL

## 2018-11-25 MED ORDER — INSULIN ASPART 100 UNIT/ML ~~LOC~~ SOLN
0.0000 [IU] | SUBCUTANEOUS | Status: DC
  Administered 2018-11-25 (×2): 2 [IU] via SUBCUTANEOUS
  Administered 2018-11-25: 06:00:00 1 [IU] via SUBCUTANEOUS
  Administered 2018-11-25 (×2): 3 [IU] via SUBCUTANEOUS
  Administered 2018-11-26: 2 [IU] via SUBCUTANEOUS
  Administered 2018-11-26: 04:00:00 3 [IU] via SUBCUTANEOUS
  Administered 2018-11-26: 2 [IU] via SUBCUTANEOUS
  Administered 2018-11-26: 1 [IU] via SUBCUTANEOUS
  Administered 2018-11-26 – 2018-11-27 (×5): 2 [IU] via SUBCUTANEOUS
  Administered 2018-11-27: 1 [IU] via SUBCUTANEOUS
  Administered 2018-11-28: 2 [IU] via SUBCUTANEOUS
  Administered 2018-11-28: 3 [IU] via SUBCUTANEOUS
  Administered 2018-11-28: 09:00:00 2 [IU] via SUBCUTANEOUS
  Administered 2018-11-28: 21:00:00 3 [IU] via SUBCUTANEOUS
  Administered 2018-11-28: 17:00:00 5 [IU] via SUBCUTANEOUS
  Administered 2018-11-29 (×3): 2 [IU] via SUBCUTANEOUS
  Administered 2018-11-29: 1 [IU] via SUBCUTANEOUS
  Administered 2018-11-30: 2 [IU] via SUBCUTANEOUS
  Administered 2018-11-30: 3 [IU] via SUBCUTANEOUS
  Administered 2018-11-30 – 2018-12-01 (×6): 2 [IU] via SUBCUTANEOUS
  Administered 2018-12-01: 12:00:00 1 [IU] via SUBCUTANEOUS
  Administered 2018-12-01: 17:00:00 2 [IU] via SUBCUTANEOUS
  Administered 2018-12-02: 09:00:00 1 [IU] via SUBCUTANEOUS
  Administered 2018-12-02 (×2): 2 [IU] via SUBCUTANEOUS
  Administered 2018-12-02: 1 [IU] via SUBCUTANEOUS
  Administered 2018-12-02 – 2018-12-03 (×4): 2 [IU] via SUBCUTANEOUS
  Administered 2018-12-03: 04:00:00 1 [IU] via SUBCUTANEOUS
  Administered 2018-12-03 – 2018-12-04 (×3): 2 [IU] via SUBCUTANEOUS
  Administered 2018-12-04: 1 [IU] via SUBCUTANEOUS
  Administered 2018-12-04: 2 [IU] via SUBCUTANEOUS
  Administered 2018-12-04 – 2018-12-05 (×2): 1 [IU] via SUBCUTANEOUS
  Administered 2018-12-05 (×2): 3 [IU] via SUBCUTANEOUS
  Administered 2018-12-06: 4 [IU] via SUBCUTANEOUS
  Administered 2018-12-06: 5 [IU] via SUBCUTANEOUS
  Administered 2018-12-06 (×2): 3 [IU] via SUBCUTANEOUS
  Administered 2018-12-06: 12:00:00 6 [IU] via SUBCUTANEOUS
  Administered 2018-12-07: 3 [IU] via SUBCUTANEOUS
  Administered 2018-12-07 (×4): 2 [IU] via SUBCUTANEOUS
  Administered 2018-12-07: 3 [IU] via SUBCUTANEOUS
  Administered 2018-12-08: 2 [IU] via SUBCUTANEOUS
  Administered 2018-12-08: 02:00:00 1 [IU] via SUBCUTANEOUS
  Administered 2018-12-08: 22:00:00 2 [IU] via SUBCUTANEOUS
  Administered 2018-12-08 (×2): 1 [IU] via SUBCUTANEOUS
  Administered 2018-12-09: 13:00:00 2 [IU] via SUBCUTANEOUS
  Administered 2018-12-09: 09:00:00 1 [IU] via SUBCUTANEOUS
  Filled 2018-11-25 (×68): qty 1

## 2018-11-25 MED ORDER — MAGNESIUM SULFATE IN D5W 1-5 GM/100ML-% IV SOLN
1.0000 g | Freq: Once | INTRAVENOUS | Status: AC
  Administered 2018-11-25: 1 g via INTRAVENOUS
  Filled 2018-11-25: qty 100

## 2018-11-25 MED ORDER — FENTANYL CITRATE (PF) 100 MCG/2ML IJ SOLN
100.0000 ug | INTRAMUSCULAR | Status: DC | PRN
  Administered 2018-11-25: 100 ug via INTRAVENOUS

## 2018-11-25 MED ORDER — POTASSIUM CHLORIDE 20 MEQ PO PACK
40.0000 meq | PACK | Freq: Once | ORAL | Status: AC
  Administered 2018-11-25: 40 meq
  Filled 2018-11-25: qty 2

## 2018-11-25 MED ORDER — FREE WATER
75.0000 mL | Status: DC
  Administered 2018-11-25 – 2018-12-09 (×72): 75 mL

## 2018-11-25 NOTE — Progress Notes (Signed)
CRITICAL CARE NOTE        SUBJECTIVE FINDINGS & SIGNIFICANT EVENTS   Patient remains critically ill Prognosis is guarded -Recurrent aspiration in chronically ill patient with multiple severe comorbid conditions Wake up assessment & spontaneous breathing trial for attempt to liberate off mechanical ventilation today.  Patient failed SBT to gag/cough reflex, will attempt again in am   PAST MEDICAL HISTORY   Past Medical History:  Diagnosis Date  . Acute MI ([place])   . Arthritis   . Back pain   . CAD (coronary artery disease)   . CHF (congestive heart failure) ([place])   . Chronic kidney disease   . Degenerative lumbar disc   . Dementia ([place])   . Diabetes mellitus without complication ([place])   . GERD (gastroesophageal reflux disease)   . Hernia of abdominal cavity   . Hyperlipemia   . Hypertension   . Malignant intraductal papillary mucinous tumor of pancreas ([place])   . Memory loss   . Pancreatitis   . Seizures ([place])    staring spells  . Shingles   . Stroke ([place]) 08/2017  . TIA (transient ischemic attack)      SURGICAL HISTORY   Past Surgical History:  Procedure Laterality Date  . CARDIAC CATHETERIZATION    . CORONARY ANGIOPLASTY    . CYSTOSCOPY WITH STENT PLACEMENT Right 05/13/2017   Procedure: CYSTOSCOPY WITH STENT PLACEMENT;  Surgeon: Nickie Retort, MD;  Location: [facility] ORS;  Service: Urology;  Laterality: Right;  . ERCP N/A 09/12/2015   Procedure: ENDOSCOPIC RETROGRADE CHOLANGIOPANCREATOGRAPHY (ERCP);  Surgeon: Hulen Luster, MD;  Location: [facility] ENDOSCOPY;  Service: Gastroenterology;  Laterality: N/A;  . ERCP N/A 01/02/2016   Procedure: ENDOSCOPIC RETROGRADE CHOLANGIOPANCREATOGRAPHY (ERCP);  Surgeon: Hulen Luster, MD;  Location: [facility] ENDOSCOPY;  Service: Gastroenterology;  Laterality: N/A;  .  EXTERNAL FIXATION WRIST FRACTURE Left   . left arm metal plate    . LEFT HEART CATH AND CORONARY ANGIOGRAPHY Left 11/04/2016   Procedure: Left Heart Cath and Coronary Angiography;  Surgeon: Isaias Cowman, MD;  Location: [facility];  Service: Cardiovascular;  Laterality: Left;  . Left shoulder surgery    . PEG PLACEMENT Left 08/28/2018   Procedure: PERCUTANEOUS ENDOSCOPIC GASTROSTOMY (PEG) PLACEMENT;  Surgeon: Lin Landsman, MD;  Location: [facility] ENDOSCOPY;  Service: Gastroenterology;  Laterality: Left;  . Stent times 3  2013   Cardiac  . TOTAL ELBOW REPLACEMENT Left   . VASECTOMY    . VENTRICULOPERITONEAL SHUNT       FAMILY HISTORY   Family History  Problem Relation Age of Onset  . Diabetes Mother   . Hypertension Mother   . CAD Mother   . Hyperlipidemia Mother   . Stroke Mother   . ALS Mother   . Alzheimer's disease Father   . Diabetes Father   . Heart Problems Brother      SOCIAL HISTORY   Social History   Tobacco Use  . Smoking status: Former Smoker    Last attempt to quit: 2013    Years since quitting: 7.2  . Smokeless tobacco: Never Used  . Tobacco comment: used to smoke 2PD for 40 yrs, quit about 4 years ago  Substance Use Topics  . Alcohol use: No    Alcohol/week: 0.0 standard drinks    Comment: occasional  . Drug use: No     MEDICATIONS   Current Medication:  Current Facility-Administered Medications:  .  0.9 %  sodium chloride infusion, , Intravenous,  Continuous, Saundra Shelling, MD, Stopped at 11/25/18 4401 .  acetaminophen (TYLENOL) tablet 650 mg, 650 mg, Oral, Q6H PRN **OR** acetaminophen (TYLENOL) suppository 650 mg, 650 mg, Rectal, Q6H PRN, Pyreddy, Pavan, MD, 650 mg at 11/25/18 0251 .  aspirin chewable tablet 81 mg, 81 mg, Per Tube, Q24H, Charlett Nose, RPH, 81 mg at 11/25/18 0857 .  azithromycin (ZITHROMAX) 500 mg in sodium chloride 0.9 % 250 mL IVPB, 500 mg, Intravenous, Q24H, Charlett Nose, RPH .  bisacodyl  (DULCOLAX) EC tablet 5 mg, 5 mg, Oral, Daily PRN, Jodell Cipro, Prasanna, MD .  ceFEPIme (MAXIPIME) 2 g in sodium chloride 0.9 % 100 mL IVPB, 2 g, Intravenous, Q8H, Rowland Lathe, RPH, Stopped at 11/25/18 0556 .  chlorhexidine gluconate (MEDLINE KIT) (PERIDEX) 0.12 % solution 15 mL, 15 mL, Mouth Rinse, BID, Lanney Gins, Noha Milberger, MD, 15 mL at 11/25/18 0852 .  Chlorhexidine Gluconate Cloth 2 % PADS 6 each, 6 each, Topical, Daily, Ottie Glazier, MD, 6 each at 11/24/18 1656 .  cholecalciferol (D-VI-SOL) 10 MCG/ML oral liquid 1,000 Units, 1,000 Units, Per Tube, Q24H, Charlett Nose, RPH, 1,000 Units at 11/25/18 0856 .  clindamycin (CLEOCIN) IVPB 600 mg, 600 mg, Intravenous, Q8H, Pyreddy, Pavan, MD, Last Rate: 100 mL/hr at 11/25/18 0851, 600 mg at 11/25/18 0851 .  clopidogrel (PLAVIX) tablet 75 mg, 75 mg, Per Tube, Q24H, Charlett Nose, RPH, 75 mg at 11/25/18 0857 .  dexmedetomidine (PRECEDEX) 400 MCG/100ML (4 mcg/mL) infusion, 0.4-1.2 mcg/kg/hr, Intravenous, Titrated, Awilda Bill, NP, Last Rate: 10.78 mL/hr at 11/25/18 0608, 0.8 mcg/kg/hr at 11/25/18 0608 .  donepezil (ARICEPT) tablet 10 mg, 10 mg, Per Tube, BID, Charlett Nose, RPH, 10 mg at 11/25/18 0857 .  enoxaparin (LOVENOX) injection 40 mg, 40 mg, Subcutaneous, Q24H, Charlett Nose, RPH, 40 mg at 11/24/18 2011 .  feeding supplement (GLUCERNA 1.5 CAL) liquid 1,000 mL, 1,000 mL, Per Tube, Continuous, Pyreddy, Pavan, MD, Last Rate: 55 mL/hr at 11/24/18 2053, 1,000 mL at 11/24/18 2053 .  free water 180 mL, 180 mL, Per Tube, Q4H, Pyreddy, Pavan, MD, 180 mL at 11/25/18 0857 .  insulin aspart (novoLOG) injection 0-9 Units, 0-9 Units, Subcutaneous, Q4H, Awilda Bill, NP, 1 Units at 11/25/18 [phone] .  insulin glargine (LANTUS) injection 10 Units, 10 Units, Subcutaneous, Q24H, Charlett Nose, RPH, 10 Units at 11/24/18 2011 .  ipratropium-albuterol (DUONEB) 0.5-2.5 (3) MG/3ML nebulizer solution 3 mL, 3 mL, Nebulization, Q6H, Pyreddy,  Pavan, MD, 3 mL at 11/25/18 0733 .  MEDLINE mouth rinse, 15 mL, Mouth Rinse, 10 times per day, Ottie Glazier, MD, 15 mL at 11/25/18 0604 .  memantine (NAMENDA) tablet 10 mg, 10 mg, Per Tube, BID, Charlett Nose, RPH, 10 mg at 11/25/18 0856 .  metoprolol tartrate (LOPRESSOR) tablet 25 mg, 25 mg, Per Tube, BID, Charlett Nose, RPH, 25 mg at 11/24/18 2012 .  norepinephrine (LEVOPHED) '4mg'$  in 267m premix infusion, 0-40 mcg/min, Intravenous, Titrated, Pyreddy, Pavan, MD, Last Rate: 11.25 mL/hr at 11/25/18 0608, 3 mcg/min at 11/25/18 0608 .  nystatin (MYCOSTATIN) 100000 UNIT/ML suspension 500,000 Units, 5 mL, Oral, QID, SCharlett Nose RPH, 500,000 Units at 11/25/18 0856 .  ondansetron (ZOFRAN) tablet 4 mg, 4 mg, Oral, Q6H PRN **OR** ondansetron (ZOFRAN) injection 4 mg, 4 mg, Intravenous, Q6H PRN, Pyreddy, Pavan, MD .  pantoprazole sodium (PROTONIX) 40 mg/20 mL oral suspension 40 mg, 40 mg, Per Tube, Q24H, SCharlett Nose RPH, 40 mg at 11/25/18 0856 .  potassium chloride (KLOR-CON) packet  20 mEq, 20 mEq, Per Tube, Q24H, Charlett Nose, RPH, 20 mEq at 11/25/18 0857 .  senna-docusate (Senokot-S) tablet 1 tablet, 1 tablet, Oral, QHS PRN, Jodell Cipro, Prasanna, MD .  vitamin C (ASCORBIC ACID) tablet 250 mg, 250 mg, Per Tube, BID, Charlett Nose, RPH, 250 mg at 11/25/18 0998    ALLERGIES   Hydrocodone; Morphine; Quetiapine; Ambien [zolpidem]; Brilinta [ticagrelor]; Flexeril [cyclobenzaprine]; Flunitrazepam; Haldol [haloperidol lactate]; Levetiracetam; Lorazepam; Risperdal [risperidone]; Trazodone; Benadryl [diphenhydramine hcl (sleep)]; Penicillins; and Vancomycin    REVIEW OF SYSTEMS    10 system ROS unable to conduct due to mechanical ventilation and sedated state  PHYSICAL EXAMINATION   Vitals:   11/25/18 0845 11/25/18 0853  BP: (!) 77/45   Pulse: 95 98  Resp: 20 16  Temp:  100 F (37.8 C)  SpO2: 100% 100%    GENERAL: On mechanical ventilation sedated HEAD:  Normocephalic, atraumatic.  EYES: Pupils equal, round, reactive to light.  No scleral icterus.  MOUTH: Moist mucosal membrane. NECK: Supple. No thyromegaly. No nodules. No JVD.  PULMONARY: Decreased breath sounds at the bases bilaterally CARDIOVASCULAR: S1 and S2. Regular rate and rhythm. No murmurs, rubs, or gallops.  GASTROINTESTINAL: Soft, nontender, non-distended. No masses. Positive bowel sounds. No hepatosplenomegaly.  MUSCULOSKELETAL: No swelling, clubbing, or edema.  NEUROLOGIC: Mild distress due to acute illness SKIN:intact,warm,dry   LABS AND IMAGING     -I personally reviewed most recent blood work, imaging and microbiology - significant findings today are hypernatremia, hypokalemia, high hyperchloremia, metabolic acidosis, hyperglycemia improved, anemia, leukocytosis, LAB RESULTS: Recent Labs  Lab 11/24/18 1314 11/25/18 0440  NA 143 146*  K 4.5 3.2*  CL 107 118*  CO2 29 20*  BUN 20 21  CREATININE 0.60* 0.55*  GLUCOSE 405* 196*   Recent Labs  Lab 11/24/18 1314 11/25/18 0440  HGB 12.2* 10.7*  HCT 39.8 35.1*  WBC 12.8* 16.4*  PLT 249 269     IMAGING RESULTS: Dg Chest Portable 1 View  Result Date: 11/24/2018 CLINICAL DATA:  63 year old male with a history of intubation. EXAM: PORTABLE CHEST 1 VIEW COMPARISON:  10/24/2018, 10/07/2018, CT 10/24/2018 FINDINGS: Cardiomediastinal silhouette unchanged in size and contour. Low lung volumes with endotracheal tube terminating 3.6 cm above the carina. Gastric tube terminates out of the field of view within the abdomen. Mixed interstitial and airspace opacities, with reticulonodular pattern of opacity, worse from the comparison plain film. Blunting of the right costophrenic angle. No visualized pneumothorax. IMPRESSION: Endotracheal tube terminates suitably above the carina. Low lung volumes with mixed interstitial and airspace opacities, concerning for multifocal pneumonia. Gastric tube terminates out of the field of view.  Electronically Signed   By: Corrie Mckusick D.O.   On: 11/24/2018 14:43      ASSESSMENT AND PLAN    -Multidisciplinary rounds held today  Acute Hypoxic Respiratory Failure           Right lower lobe pneumonia - possible aspiration   -Attempting to liberate from mechanical ventilation today           -Influenza negative, novel coronavirus pending -currently on cleocin/zithromax/cefepime -appreciate pharmD            -Procalcitonin within normal limits-we will obtain ferritin and CRP as well while waiting on coronal results   CARDIAC FAILURE-         Grade 1 diastolic CHF history  -troponin normal  -oxygen as needed -Lasix as tolerated -follow up cardiac enzymes as indicated ICU monitoring    Severe hyperglycemia-resolved on  weight-based insulin regimen          Continue current long-acting and short acting insulin as well as ISS AC at bedtime         Protein calorie malnutrition - moderate - albumin 2.5       Dietary consultation-appropriate nourishment likely aspiration precautions needed   NEUROLOGY - intubated and sedated - minimal sedation to achieve a RASS goal: -1 Wake up assessment pending   ID -continue IV abx as prescibed -follow up cultures  GI/Nutrition GI PROPHYLAXIS as indicated DIET-->TF's as tolerated Constipation protocol as indicated  ENDO - ICU hypoglycemic\Hyperglycemia protocol -check FSBS per protocol   ELECTROLYTES -follow labs as needed -replace as needed -pharmacy consultation   DVT/GI PRX ordered -SCDs  TRANSFUSIONS AS NEEDED MONITOR FSBS ASSESS the need for LABS as needed   Critical care provider statement:   Critical care time (minutes):  111  Critical care time was exclusive of: Separately billable procedures and treating other patients  Critical care was necessary to treat or prevent imminent or life-threatening deterioration of the following conditions:  Acute hypoxemic respiratory failure,  right lower lobe pneumonia possibly aspiration related, protein calorie malnutrition-moderate, severe hyperglycemia, history of diastolic CHF, multiple comorbid conditions  Critical care was time spent personally by me on the following activities: Development of treatment plan with patient or surrogate, discussions with consultants, evaluation of patient's response to treatment, examination of patient, obtaining history from patient or surrogate, ordering and performing treatments and interventions, ordering and review of laboratory studies and re-evaluation of patient's condition.  I assumed direction of critical care for this patient from another provider in my specialty: no    Ottie Glazier, M.D.  Division of [facility]

## 2018-11-25 NOTE — Progress Notes (Signed)
Initial Nutrition Assessment  DOCUMENTATION CODES:   Severe malnutrition in context of acute illness/injury  INTERVENTION:   Initiate Glucerna 1.2 @ goal rate of 66ml/hr  Free water flushes 98ml q4 hours   Regimen provides 1872kcal/day, 94g/day protein, 1766ml/day free water   NUTRITION DIAGNOSIS:   Severe Malnutrition related to chronic illness as evidenced by moderate fat depletion, moderate muscle depletion, 16.5 percent weight loss in 2 months.  GOAL:   Provide needs based on ASPEN/SCCM guidelines  MONITOR:   Vent status, Labs, Weight trends, I & O's, Skin, TF tolerance  REASON FOR ASSESSMENT:   Consult Assessment of nutrition requirement/status  ASSESSMENT:   63 y.o. male with a history of CVA, non verbal. dementia, s/p PEG, NH resident, CAD, CHF, CKD admitted from SNF with fever, cough and hypoxia  RD working remotely.  Pt sedated and ventilated. Pt is well known to nutrition department from multiple previous admits. Will plan to resume patient's home tube feed formula and adjust to meet patient's estimated needs. Per chart, pt appears weight stable from his previous admit.   Medications reviewed and include: aspirin, vitamin D, plavix, lovenox, insulin (novolog + Lantus), protonix, KCl, vitamin C, azithromycin, cefepime, clindamycin, precedex, levophed   Labs reviewed: Na 146(H), K 3.2(L), P 2.8 wnl, Mg 1.8 wnl Wbc- 16.4(H), Hgb 10.7(L), Hct 35.1(L) cbgs- 283, 167, 149, 169 x 24 hrs  Patient is currently intubated on ventilator support MV: 12.8 L/min Temp (24hrs), Avg:101.6 F (38.7 C), Min:98.4 F (36.9 C), Max:104 F (40 C)  Propofol: none   MAP- >28mmHg  UOP-422ml   Unable to complete Nutrition-Focused physical exam at this time.  Diet Order:   Diet Order            Diet NPO time specified  Diet effective now             EDUCATION NEEDS:   Not appropriate for education at this time  Skin:  Skin Assessment: Reviewed RN  Assessment(coccyx wound documented on last admit )  Last BM:  4/4- type 5  Height:   Ht Readings from Last 1 Encounters:  11/24/18 5\' 6"  (1.676 m)    Weight:   Wt Readings from Last 1 Encounters:  11/25/18 55.4 kg    Ideal Body Weight:  64.5 kg  BMI:  Body mass index is 19.71 kg/m.  Estimated Nutritional Needs:   Kcal:  2119kcal/day   Protein:  89-100g/day   Fluid:  1.7L/day   Koleen Distance MS, RD, LDN Pager #- [phone] Office#- [phone] After Hours Pager: [phone]

## 2018-11-25 NOTE — Consult Note (Addendum)
PHARMACY CONSULT NOTE - FOLLOW UP  Pharmacy Consult for Electrolyte Monitoring and Replacement   Recent Labs: Potassium (mmol/L)  Date Value  11/25/2018 3.2 (L)  12/17/2014 3.7   Magnesium (mg/dL)  Date Value  11/25/2018 1.8  12/17/2014 1.8   Calcium (mg/dL)  Date Value  11/25/2018 7.3 (L)   Calcium, Total (mg/dL)  Date Value  12/17/2014 7.9 (L)   Albumin (g/dL)  Date Value  11/24/2018 2.5 (L)  10/20/2016 4.5  12/17/2014 3.2 (L)   Phosphorus (mg/dL)  Date Value  11/25/2018 2.8   Sodium (mmol/L)  Date Value  11/25/2018 146 (H)  10/20/2016 146 (H)  12/17/2014 138   Assessment: Pharmacy consulted for electrolyte monitoring and replacement in 63 yo male admitted to ICU acute hypoxemic respiratory failure.   Goal of Therapy:  K ~ 4, Mg ~2  Plan:  4/4 K: 3.2, Mg: 1.8. Patient has order for KCL 1mEq daily per tube.  Will order magnesium 1 g IV x 1 dose and an additional  KCL 41mEq x 1 dose per tube.   Will continue to monitor and replace electrolytes as needed.   F/U with AM labs on 4/5  Pernell Dupre, PharmD, BCPS Clinical Pharmacist 11/25/2018 5:54 AM

## 2018-11-26 ENCOUNTER — Inpatient Hospital Stay: Payer: PPO

## 2018-11-26 LAB — CBC WITH DIFFERENTIAL/PLATELET
Abs Immature Granulocytes: 0.08 10*3/uL — ABNORMAL HIGH (ref 0.00–0.07)
Basophils Absolute: 0 10*3/uL (ref 0.0–0.1)
Basophils Relative: 0 %
Eosinophils Absolute: 0.1 10*3/uL (ref 0.0–0.5)
Eosinophils Relative: 1 %
HCT: 33 % — ABNORMAL LOW (ref 39.0–52.0)
Hemoglobin: 9.7 g/dL — ABNORMAL LOW (ref 13.0–17.0)
Immature Granulocytes: 1 %
Lymphocytes Relative: 12 %
Lymphs Abs: 1.3 10*3/uL (ref 0.7–4.0)
MCH: 28.6 pg (ref 26.0–34.0)
MCHC: 29.4 g/dL — ABNORMAL LOW (ref 30.0–36.0)
MCV: 97.3 fL (ref 80.0–100.0)
Monocytes Absolute: 0.9 10*3/uL (ref 0.1–1.0)
Monocytes Relative: 8 %
Neutro Abs: 8.3 10*3/uL — ABNORMAL HIGH (ref 1.7–7.7)
Neutrophils Relative %: 78 %
Platelets: 224 10*3/uL (ref 150–400)
RBC: 3.39 MIL/uL — ABNORMAL LOW (ref 4.22–5.81)
RDW: 16.9 % — ABNORMAL HIGH (ref 11.5–15.5)
Smear Review: NORMAL
WBC: 10.7 10*3/uL — ABNORMAL HIGH (ref 4.0–10.5)
nRBC: 0 % (ref 0.0–0.2)

## 2018-11-26 LAB — BASIC METABOLIC PANEL
Anion gap: 7 (ref 5–15)
BUN: 20 mg/dL (ref 8–23)
CO2: 22 mmol/L (ref 22–32)
Calcium: 7.5 mg/dL — ABNORMAL LOW (ref 8.9–10.3)
Chloride: 117 mmol/L — ABNORMAL HIGH (ref 98–111)
Creatinine, Ser: 0.47 mg/dL — ABNORMAL LOW (ref 0.61–1.24)
GFR calc Af Amer: >60 mL/min (ref >60)
GFR calc non Af Amer: >60 mL/min (ref >60)
Glucose, Bld: 260 mg/dL — ABNORMAL HIGH (ref 70–99)
Potassium: 4.1 mmol/L (ref 3.5–5.1)
Sodium: 146 mmol/L — ABNORMAL HIGH (ref 135–145)

## 2018-11-26 LAB — GLUCOSE, CAPILLARY
Glucose-Capillary: 145 mg/dL — ABNORMAL HIGH (ref 70–99)
Glucose-Capillary: 167 mg/dL — ABNORMAL HIGH (ref 70–99)
Glucose-Capillary: 169 mg/dL — ABNORMAL HIGH (ref 70–99)
Glucose-Capillary: 173 mg/dL — ABNORMAL HIGH (ref 70–99)
Glucose-Capillary: 184 mg/dL — ABNORMAL HIGH (ref 70–99)
Glucose-Capillary: 185 mg/dL — ABNORMAL HIGH (ref 70–99)
Glucose-Capillary: 187 mg/dL — ABNORMAL HIGH (ref 70–99)
Glucose-Capillary: 238 mg/dL — ABNORMAL HIGH (ref 70–99)

## 2018-11-26 LAB — MAGNESIUM: Magnesium: 2 mg/dL (ref 1.7–2.4)

## 2018-11-26 LAB — PHOSPHORUS: Phosphorus: 2.3 mg/dL — ABNORMAL LOW (ref 2.5–4.6)

## 2018-11-26 LAB — CORTISOL: Cortisol, Plasma: 12 ug/dL

## 2018-11-26 LAB — C-REACTIVE PROTEIN: CRP: 31 mg/dL — ABNORMAL HIGH (ref <1.0)

## 2018-11-26 MED ORDER — METOPROLOL TARTRATE 5 MG/5ML IV SOLN
2.5000 mg | Freq: Four times a day (QID) | INTRAVENOUS | Status: DC
  Administered 2018-11-26 – 2018-12-03 (×28): 2.5 mg via INTRAVENOUS
  Filled 2018-11-26 (×27): qty 5

## 2018-11-26 MED ORDER — ALBUMIN HUMAN 25 % IV SOLN
12.5000 g | Freq: Once | INTRAVENOUS | Status: AC
  Administered 2018-11-26: 13:00:00 12.5 g via INTRAVENOUS
  Filled 2018-11-26: qty 50

## 2018-11-26 MED ORDER — SODIUM CHLORIDE 0.9 % IV SOLN
100.0000 mg | Freq: Two times a day (BID) | INTRAVENOUS | Status: DC
  Administered 2018-11-26 (×2): 100 mg via INTRAVENOUS
  Filled 2018-11-26 (×4): qty 100

## 2018-11-26 MED ORDER — POTASSIUM CHLORIDE 20 MEQ PO PACK
20.0000 meq | PACK | ORAL | Status: DC
  Administered 2018-11-28 – 2018-12-09 (×12): 20 meq
  Filled 2018-11-26 (×12): qty 1

## 2018-11-26 MED ORDER — K PHOS MONO-SOD PHOS DI & MONO 155-852-130 MG PO TABS
500.0000 mg | ORAL_TABLET | ORAL | Status: AC
  Administered 2018-11-26 (×2): 500 mg
  Filled 2018-11-26 (×3): qty 2

## 2018-11-26 MED ORDER — FUROSEMIDE 10 MG/ML IJ SOLN
20.0000 mg | Freq: Once | INTRAMUSCULAR | Status: AC
  Administered 2018-11-26: 20 mg via INTRAVENOUS
  Filled 2018-11-26: qty 2

## 2018-11-26 MED ORDER — K PHOS MONO-SOD PHOS DI & MONO 155-852-130 MG PO TABS
250.0000 mg | ORAL_TABLET | ORAL | Status: DC
  Filled 2018-11-26 (×2): qty 1

## 2018-11-26 MED ORDER — HYDROCORTISONE NA SUCCINATE PF 100 MG IJ SOLR
100.0000 mg | Freq: Two times a day (BID) | INTRAMUSCULAR | Status: DC
  Administered 2018-11-26 – 2018-11-28 (×5): 100 mg via INTRAVENOUS
  Filled 2018-11-26 (×6): qty 2

## 2018-11-26 MED ORDER — IPRATROPIUM-ALBUTEROL 20-100 MCG/ACT IN AERS
1.0000 | INHALATION_SPRAY | Freq: Four times a day (QID) | RESPIRATORY_TRACT | Status: DC
  Administered 2018-11-26 – 2018-11-28 (×6): 1 via RESPIRATORY_TRACT
  Filled 2018-11-26: qty 4

## 2018-11-26 NOTE — Consult Note (Signed)
PHARMACY CONSULT NOTE - FOLLOW UP  Pharmacy Consult for Electrolyte Monitoring and Replacement   Recent Labs: Potassium (mmol/L)  Date Value  11/26/2018 4.1  12/17/2014 3.7   Magnesium (mg/dL)  Date Value  11/26/2018 2.0  12/17/2014 1.8   Calcium (mg/dL)  Date Value  11/26/2018 7.5 (L)   Calcium, Total (mg/dL)  Date Value  12/17/2014 7.9 (L)   Albumin (g/dL)  Date Value  11/24/2018 2.5 (L)  10/20/2016 4.5  12/17/2014 3.2 (L)   Phosphorus (mg/dL)  Date Value  11/26/2018 2.3 (L)   Sodium (mmol/L)  Date Value  11/26/2018 146 (H)  10/20/2016 146 (H)  12/17/2014 138   Assessment: Pharmacy consulted for electrolyte monitoring and replacement in 63 yo male admitted to ICU acute hypoxemic respiratory failure.   Goal of Therapy:  K ~ 4, Mg ~2  Plan:  4/5 K 4.1, Mg: 2.0. Patient has order for KCL 60mEq daily per tube, will move dose to 4/6 and order KPhos Neutral 2 tablet every 4 hours x 2 hours.  Will continue to monitor and replace electrolytes as needed.   F/U with AM labs on 4/6  Pernell Dupre, PharmD, BCPS Clinical Pharmacist 11/26/2018 4:36 AM

## 2018-11-26 NOTE — Progress Notes (Signed)
CRITICAL CARE NOTE       SUBJECTIVE FINDINGS & SIGNIFICANT EVENTS   Patient remains critically ill Prognosis is guarded 8am - Has been on PS mode overnight doing well , noted to be aspirating nourishment from OGT to ETT and feeds were stopped.     -will attempt liberation from MV if passing all weaning parameters, yesterday no gag/cough  405pm - successfully weaned off vasopressor support.  520pm- successfully off MV.    PAST MEDICAL HISTORY   Past Medical History:  Diagnosis Date   Acute [facility])    Arthritis    Back pain    CAD (coronary artery disease)    CHF (congestive heart failure) (HCC)    Chronic kidney disease    Degenerative lumbar disc    Dementia (HCC)    Diabetes mellitus without complication (HCC)    GERD (gastroesophageal reflux disease)    Hernia of abdominal cavity    Hyperlipemia    Hypertension    Malignant intraductal papillary mucinous tumor of pancreas ([place])    Memory loss    Pancreatitis    Seizures ([place])    staring spells   Shingles    Stroke ([place]) 08/2017   TIA (transient ischemic attack)      SURGICAL HISTORY   Past Surgical History:  Procedure Laterality Date   CARDIAC CATHETERIZATION     CORONARY ANGIOPLASTY     CYSTOSCOPY WITH STENT PLACEMENT Right 05/13/2017   Procedure: CYSTOSCOPY WITH STENT PLACEMENT;  Surgeon: Nickie Retort, MD;  Location: [facility] ORS;  Service: Urology;  Laterality: Right;   ERCP N/A 09/12/2015   Procedure: ENDOSCOPIC RETROGRADE CHOLANGIOPANCREATOGRAPHY (ERCP);  Surgeon: Hulen Luster, MD;  Location: [facility] ENDOSCOPY;  Service: Gastroenterology;  Laterality: N/A;   ERCP N/A 01/02/2016   Procedure: ENDOSCOPIC RETROGRADE CHOLANGIOPANCREATOGRAPHY (ERCP);  Surgeon: Hulen Luster, MD;  Location: [facility] ENDOSCOPY;  Service:  Gastroenterology;  Laterality: N/A;   EXTERNAL FIXATION WRIST FRACTURE Left    left arm metal plate     LEFT HEART CATH AND CORONARY ANGIOGRAPHY Left 11/04/2016   Procedure: Left Heart Cath and Coronary Angiography;  Surgeon: Isaias Cowman, MD;  Location: [place] [facility] LAB;  Service: Cardiovascular;  Laterality: Left;   Left shoulder surgery     PEG PLACEMENT Left 08/28/2018   Procedure: PERCUTANEOUS ENDOSCOPIC GASTROSTOMY (PEG) PLACEMENT;  Surgeon: Lin Landsman, MD;  Location: [facility] ENDOSCOPY;  Service: Gastroenterology;  Laterality: Left;   Stent times 3  2013   Cardiac   TOTAL ELBOW REPLACEMENT Left    VASECTOMY     VENTRICULOPERITONEAL SHUNT       FAMILY HISTORY   Family History  Problem Relation Age of Onset   Diabetes Mother    Hypertension Mother    CAD Mother    Hyperlipidemia Mother    Stroke Mother    ALS Mother    Alzheimer's disease Father    Diabetes Father    Heart Problems Brother      SOCIAL HISTORY   Social History   Tobacco Use   Smoking status: Former Smoker    Last attempt to quit: 2013    Years since quitting: 7.2   Smokeless tobacco: Never Used   Tobacco comment: used to smoke 2PD for 40 yrs, quit about 4 years ago  Substance Use Topics   Alcohol use: No    Alcohol/week: 0.0 standard drinks    Comment: occasional   Drug use: No     MEDICATIONS   Current  Medication:  Current Facility-Administered Medications:    0.9 %  sodium chloride infusion, , Intravenous, Continuous, Pyreddy, Pavan, MD, Stopped at 11/26/18 0544   acetaminophen (TYLENOL) tablet 650 mg, 650 mg, Oral, Q6H PRN, 650 mg at 11/26/18 0433 **OR** acetaminophen (TYLENOL) suppository 650 mg, 650 mg, Rectal, Q6H PRN, Saundra Shelling, MD, 650 mg at 11/25/18 0251   aspirin chewable tablet 81 mg, 81 mg, Per Tube, Q24H, Charlett Nose, RPH, 81 mg at 11/26/18 0800   azithromycin (ZITHROMAX) 500 mg in sodium chloride 0.9 % 250 mL IVPB, 500 mg,  Intravenous, Q24H, Charlett Nose, RPH, Stopped at 11/25/18 2021   bisacodyl (DULCOLAX) EC tablet 5 mg, 5 mg, Oral, Daily PRN, Arta Silence, MD   ceFEPIme (MAXIPIME) 2 g in sodium chloride 0.9 % 100 mL IVPB, 2 g, Intravenous, Q8H, Rowland Lathe, RPH, Stopped at 11/26/18 0542   chlorhexidine gluconate (MEDLINE KIT) (PERIDEX) 0.12 % solution 15 mL, 15 mL, Mouth Rinse, BID, Lanney Gins, Son Barkan, MD, 15 mL at 11/26/18 1696   Chlorhexidine Gluconate Cloth 2 % PADS 6 each, 6 each, Topical, Daily, Ottie Glazier, MD, 6 each at 11/26/18 7893   cholecalciferol (D-VI-SOL) 10 MCG/ML oral liquid 1,000 Units, 1,000 Units, Per Tube, Q24H, Charlett Nose, RPH, 1,000 Units at 11/26/18 0759   clindamycin (CLEOCIN) IVPB 600 mg, 600 mg, Intravenous, Q8H, Pyreddy, Pavan, MD, Last Rate: 100 mL/hr at 11/26/18 0545, 600 mg at 11/26/18 0545   clopidogrel (PLAVIX) tablet 75 mg, 75 mg, Per Tube, Q24H, Charlett Nose, RPH, 75 mg at 11/26/18 0759   dexmedetomidine (PRECEDEX) 400 MCG/100ML (4 mcg/mL) infusion, 0.4-1.2 mcg/kg/hr, Intravenous, Titrated, Awilda Bill, NP, Stopped at 11/26/18 0307   donepezil (ARICEPT) tablet 10 mg, 10 mg, Per Tube, BID, Charlett Nose, RPH, 10 mg at 11/26/18 0758   enoxaparin (LOVENOX) injection 40 mg, 40 mg, Subcutaneous, Q24H, Charlett Nose, RPH, 40 mg at 11/25/18 2027   feeding supplement (GLUCERNA 1.5 CAL) liquid 1,000 mL, 1,000 mL, Per Tube, Continuous, Ottie Glazier, MD, Stopped at 11/26/18 0811   fentaNYL (SUBLIMAZE) injection 100 mcg, 100 mcg, Intravenous, Q15 min PRN, Awilda Bill, NP   fentaNYL (SUBLIMAZE) injection 100 mcg, 100 mcg, Intravenous, Q2H PRN, Awilda Bill, NP, 100 mcg at 11/25/18 0000   free water 75 mL, 75 mL, Per Tube, Q4H, Ante Arredondo, MD, 75 mL at 11/26/18 0811   insulin aspart (novoLOG) injection 0-9 Units, 0-9 Units, Subcutaneous, Q4H, Awilda Bill, NP, 2 Units at 11/26/18 0756   insulin glargine (LANTUS)  injection 10 Units, 10 Units, Subcutaneous, Q24H, Charlett Nose, RPH, 10 Units at 11/25/18 2028   ipratropium-albuterol (DUONEB) 0.5-2.5 (3) MG/3ML nebulizer solution 3 mL, 3 mL, Nebulization, Q6H, Pyreddy, Pavan, MD, 3 mL at 11/26/18 0741   MEDLINE mouth rinse, 15 mL, Mouth Rinse, 10 times per day, Ottie Glazier, MD, 15 mL at 11/26/18 0423   memantine (NAMENDA) tablet 10 mg, 10 mg, Per Tube, BID, Charlett Nose, RPH, 10 mg at 11/26/18 0800   metoprolol tartrate (LOPRESSOR) tablet 25 mg, 25 mg, Per Tube, BID, Charlett Nose, RPH, 25 mg at 11/26/18 0759   norepinephrine (LEVOPHED) 84m in 254mpremix infusion, 0-40 mcg/min, Intravenous, Titrated, Pyreddy, Pavan, MD, Last Rate: 11.25 mL/hr at 11/26/18 0811, 3 mcg/min at 11/26/18 0811   nystatin (MYCOSTATIN) 100000 UNIT/ML suspension 500,000 Units, 5 mL, Oral, QID, SiCharlett NoseRPH, 500,000 Units at 11/26/18 0811   ondansetron (ZOFRAN) tablet 4 mg, 4 mg, Oral, Q6H  PRN **OR** ondansetron (ZOFRAN) injection 4 mg, 4 mg, Intravenous, Q6H PRN, Pyreddy, Pavan, MD   pantoprazole sodium (PROTONIX) 40 mg/20 mL oral suspension 40 mg, 40 mg, Per Tube, Q24H, Charlett Nose, RPH, 40 mg at 11/26/18 0757   [START ON 11/27/2018] potassium chloride (KLOR-CON) packet 20 mEq, 20 mEq, Per Tube, Q24H, Hallaji, Sheema M, RPH   senna-docusate (Senokot-S) tablet 1 tablet, 1 tablet, Oral, QHS PRN, Jodell Cipro, Prasanna, MD   vitamin C (ASCORBIC ACID) tablet 250 mg, 250 mg, Per Tube, BID, Charlett Nose, RPH, 250 mg at 11/26/18 0801    ALLERGIES   Hydrocodone; Morphine; Quetiapine; Ambien [zolpidem]; Brilinta [ticagrelor]; Flexeril [cyclobenzaprine]; Flunitrazepam; Haldol [haloperidol lactate]; Levetiracetam; Lorazepam; Risperdal [risperidone]; Trazodone; Benadryl [diphenhydramine hcl (sleep)]; Penicillins; and Vancomycin    REVIEW OF SYSTEMS    Unable to obtain due to mechanical ventilation   PHYSICAL EXAMINATION   Vitals:    11/26/18 0757 11/26/18 0800  BP: 124/68 107/65  Pulse: (!) 103 (!) 103  Resp:  (!) 25  Temp:  99.9 F (37.7 C)  SpO2:  100%    GENERAL:RASS-2, chronically ill appearing HEAD: Normocephalic, atraumatic.  EYES: Pupils equal, round, reactive to light.  No scleral icterus.  MOUTH: Moist mucosal membrane. NECK: Supple. No thyromegaly. No nodules. No JVD.  PULMONARY: CTAB CARDIOVASCULAR: S1 and S2. Regular rate and rhythm. No murmurs, rubs, or gallops.  GASTROINTESTINAL: Soft, nontender, non-distended. No masses. Positive bowel sounds. No hepatosplenomegaly.  MUSCULOSKELETAL: No swelling, clubbing, or edema.  NEUROLOGIC: Sedated  SKIN:intact,warm,dry   LABS AND IMAGING     LAB RESULTS: Recent Labs  Lab 11/24/18 1314 11/25/18 0440 11/26/18 0352  NA 143 146* 146*  K 4.5 3.2* 4.1  CL 107 118* 117*  CO2 29 20* 22  BUN _0 CREATININE 0.60* 0.55* 0.47*  GLUCOSE 405* 196* 260*   Recent Labs  Lab 11/24/18 1314 11/25/18 0440 11/26/18 0352  HGB 12.2* 10.7* 9.7*  HCT 39.8 35.1* 33.0*  WBC 12.8* 16.4* 10.7*  PLT 249 269 224     IMAGING RESULTS: Dg Chest Port 1 View  Result Date: 11/26/2018 CLINICAL DATA:  Respiratory failure. Intubated patient. Follow-up exam. EXAM: PORTABLE CHEST 1 VIEW COMPARISON:  Multiple prior exams, most recent dated 11/24/2018. FINDINGS: Prominent bronchovascular markings most evident in the right lower lung. There are thickened interstitial markings with in both lung bases. Remainder of the lungs essentially clear. Small right pleural effusion. No pneumothorax. Endotracheal tube and nasal/orogastric tube are stable in well positioned. IMPRESSION: 1. No change in the appearance of the lungs from the most recent prior study. 2. Prominent bronchovascular markings with interstitial opacities in the right lung base, with milder interstitial opacities in the left lung base, are consistent with infection or aspiration. Small right pleural effusion. No new  lung abnormalities. 3. Stable well-positioned support apparatus. Electronically Signed   By: Lajean Manes M.D.   On: 11/26/2018 08:43      ASSESSMENT AND PLAN       Acute Hypoxic Respiratory Failure Bilateral pneumonia worse at RLL - likely aspiration pneumonia, hx of of recurrent aspiration    -Attempting to liberate from mechanical ventilation today -Influenza negative, novel coronavirus pending-11/26/18 today is day 3 waiting   - Hx of MSSA resp cx - 09/19/18 - now with trending up WBC, febrile overnight Tmax 100.8 will broaden regimen to include Doxy due to vanco allergy and will d/c cleocin, continue with cefepime and zithromax for GN and atypical coverage.  Aware that  nasal MRSA is neg.     -ferritn and PCT within reference range, CRP elevated at 31    Grade 1 diastolic CHF history  - will gently diurese with addition of albumin to increase efficacy at [facility] - monitoring BP currently on 65mg Levophed  - Solucortef 100q12h starting today due to high likelihood of adrenal insufficiency  ICU monitoring- on tele    Severe hyperglycemia-resolved on weight-based insulin regimen Continue current long-acting and short acting insulin as well as ISS AC at bedtime    Protein calorie malnutrition - moderate - albumin 2.5  -Dietary consultation-appropriate nourishment likely aspiration precautions needed     - stopping OG  Feeds for now due to debris found in ETT,  Patient has PEG will clarify why its not utilized     NEUROLOGY - s/p extubation , baseline dementia with organic brain syndrome    ID -continue IV abx as prescibed -follow up cultures  GI/Nutrition GI PROPHYLAXIS as indicated DIET-->TF's as tolerated Constipation protocol as indicated  ENDO - ICU hypoglycemic\Hyperglycemia protocol -check FSBS per protocol   ELECTROLYTES -follow labs as needed -replace as needed -pharmacy consultation   DVT/GI  PRX ordered -SCDs  TRANSFUSIONS AS NEEDED MONITOR FSBS ASSESS the need for LABS as needed   Critical care provider statement:  Critical care time (minutes): 120 Critical care time was exclusive of: Separately billable procedures and treating other patients Critical care was necessary to treat or prevent imminent or life-threatening deterioration of the following conditions:Acute hypoxemic respiratory failure, right lower lobe pneumonia possibly aspiration related, protein calorie malnutrition-moderate, severe hyperglycemia, history of diastolic CHF,multiple comorbid conditions Critical care was time spent personally by me on the following activities: Development of treatment plan with patient or surrogate, discussions with consultants, evaluation of patient's response to treatment, examination of patient, obtaining history from patient or surrogate, ordering and performing treatments and interventions, ordering and review of laboratory studies and re-evaluation of patient's condition. I assumed direction of critical care for this patient from another provider in my specialty: no    This document was prepared using Dragon voice recognition software and may include unintentional dictation errors.    FOttie Glazier M.D.  [place]

## 2018-11-26 NOTE — Progress Notes (Signed)
Pt extubated per MD order, pt tolerated procedure well. Pt placed on 3Lnc. Will continue to monitor

## 2018-11-26 NOTE — Progress Notes (Signed)
Xray to bedside to perform KUB, tech unable to flush contrast into PEG tube. ICU MD notified and stated he would place GI consult.

## 2018-11-26 NOTE — Progress Notes (Signed)
Patient extubated today, placed on 3L nasal cannula, tolerating well. Levophed off majority of shift, BP stable and WNL. Patient non-verbal, not following commands, but alert and purposeful with movements. Foley in place, lasix given with >1L output, soft incontinent BM x 2 today. OG removed with extubation, PEG tube clamped at this time, usage pending KUB which has been ordered but not yet completed. Wife called in am, was updated by RN prior to extubation. Patient resting quietly at this time. Will continue to monitor.

## 2018-11-27 DIAGNOSIS — Z7189 Other specified counseling: Secondary | ICD-10-CM

## 2018-11-27 DIAGNOSIS — Z515 Encounter for palliative care: Secondary | ICD-10-CM

## 2018-11-27 LAB — GLUCOSE, CAPILLARY
Glucose-Capillary: 130 mg/dL — ABNORMAL HIGH (ref 70–99)
Glucose-Capillary: 110 mg/dL — ABNORMAL HIGH (ref 70–99)
Glucose-Capillary: 123 mg/dL — ABNORMAL HIGH (ref 70–99)
Glucose-Capillary: 159 mg/dL — ABNORMAL HIGH (ref 70–99)
Glucose-Capillary: 157 mg/dL — ABNORMAL HIGH (ref 70–99)

## 2018-11-27 LAB — CBC
HCT: 33.3 % — ABNORMAL LOW (ref 39.0–52.0)
Hemoglobin: 10.1 g/dL — ABNORMAL LOW (ref 13.0–17.0)
MCH: 28.8 pg (ref 26.0–34.0)
MCHC: 30.3 g/dL (ref 30.0–36.0)
MCV: 94.9 fL (ref 80.0–100.0)
Platelets: 243 10*3/uL (ref 150–400)
RBC: 3.51 MIL/uL — ABNORMAL LOW (ref 4.22–5.81)
RDW: 16.8 % — ABNORMAL HIGH (ref 11.5–15.5)
WBC: 6.8 10*3/uL (ref 4.0–10.5)
nRBC: 0 % (ref 0.0–0.2)

## 2018-11-27 LAB — BASIC METABOLIC PANEL
Anion gap: 7 (ref 5–15)
BUN: 14 mg/dL (ref 8–23)
CO2: 25 mmol/L (ref 22–32)
Calcium: 7.7 mg/dL — ABNORMAL LOW (ref 8.9–10.3)
Chloride: 115 mmol/L — ABNORMAL HIGH (ref 98–111)
Creatinine, Ser: 0.39 mg/dL — ABNORMAL LOW (ref 0.61–1.24)
GFR calc Af Amer: >60 mL/min (ref >60)
GFR calc non Af Amer: >60 mL/min (ref >60)
Glucose, Bld: 148 mg/dL — ABNORMAL HIGH (ref 70–99)
Potassium: 2.4 mmol/L — CL (ref 3.5–5.1)
Sodium: 147 mmol/L — ABNORMAL HIGH (ref 135–145)

## 2018-11-27 LAB — MAGNESIUM: Magnesium: 1.8 mg/dL (ref 1.7–2.4)

## 2018-11-27 LAB — PHOSPHORUS: Phosphorus: 2.7 mg/dL (ref 2.5–4.6)

## 2018-11-27 LAB — POTASSIUM: Potassium: 2.9 mmol/L — ABNORMAL LOW (ref 3.5–5.1)

## 2018-11-27 MED ORDER — COLLAGENASE 250 UNIT/GM EX OINT
TOPICAL_OINTMENT | Freq: Every day | CUTANEOUS | Status: DC
  Administered 2018-11-27 – 2018-12-09 (×13): via TOPICAL
  Filled 2018-11-27 (×3): qty 30

## 2018-11-27 MED ORDER — SODIUM BICARBONATE 650 MG PO TABS
650.0000 mg | ORAL_TABLET | Freq: Once | ORAL | Status: DC
  Filled 2018-11-27: qty 1

## 2018-11-27 MED ORDER — PANCRELIPASE (LIP-PROT-AMYL) 10440-39150 UNITS PO TABS
20880.0000 [IU] | ORAL_TABLET | Freq: Once | ORAL | Status: DC
  Filled 2018-11-27: qty 2

## 2018-11-27 MED ORDER — PANCRELIPASE (LIP-PROT-AMYL) 12000-38000 UNITS PO CPEP
24000.0000 [IU] | ORAL_CAPSULE | Freq: Once | ORAL | Status: DC
  Filled 2018-11-27: qty 2

## 2018-11-27 MED ORDER — POTASSIUM CHLORIDE 10 MEQ/100ML IV SOLN
10.0000 meq | INTRAVENOUS | Status: AC
  Administered 2018-11-27 (×5): 10 meq via INTRAVENOUS
  Filled 2018-11-27 (×5): qty 100

## 2018-11-27 MED ORDER — GLUCERNA 1.5 CAL PO LIQD
1000.0000 mL | ORAL | Status: DC
  Administered 2018-11-27 – 2018-12-08 (×7): 1000 mL

## 2018-11-27 MED ORDER — POTASSIUM CHLORIDE 2 MEQ/ML IV SOLN
INTRAVENOUS | Status: DC
  Administered 2018-11-27 – 2018-11-30 (×4): via INTRAVENOUS
  Filled 2018-11-27 (×16): qty 1000

## 2018-11-27 MED ORDER — POTASSIUM CHLORIDE 20 MEQ PO PACK
40.0000 meq | PACK | ORAL | Status: AC
  Administered 2018-11-27: 40 meq
  Filled 2018-11-27: qty 2

## 2018-11-27 MED ORDER — SODIUM BICARBONATE 650 MG PO TABS
650.0000 mg | ORAL_TABLET | Freq: Once | ORAL | Status: AC
  Administered 2018-11-27: 650 mg
  Filled 2018-11-27: qty 1

## 2018-11-27 MED ORDER — MAGNESIUM SULFATE 2 GM/50ML IV SOLN
2.0000 g | Freq: Once | INTRAVENOUS | Status: AC
  Administered 2018-11-27: 12:00:00 2 g via INTRAVENOUS
  Filled 2018-11-27: qty 50

## 2018-11-27 NOTE — Progress Notes (Signed)
Date of Admission:  11/24/2018      ID: Maurice Patterson is a 63 y.o. male Active Problems:   Acute respiratory failure ([place])  Maurice Patterson is a 63 y.o.male  with a history of CVA, non verbal. dementia Peg, NH resident, CAD, CHF, CKD admitted from  NH with fever , cough and hypoxia-As PER EMS pt has been unresponsive for a month since he had a cerebral bleed in [facility] . In the ED he was hypoxic with pulse ox of 70, started on NRB and then was intubated He was recently in [place] between 10/24/18 and weas discharged on 11/21/18 to [place] care center. Pt had intraparenchymal left cerebral hemorrhage on 10/24/18 and was sent to [facility] from [facility] . HE was in ICU with septic shock , He never regained consciousness . Sacral wound  Subjective: Extubated , non verbal  Medications:  . aspirin  81 mg Per Tube Q24H  . chlorhexidine gluconate (MEDLINE KIT)  15 mL Mouth Rinse BID  . Chlorhexidine Gluconate Cloth  6 each Topical Daily  . cholecalciferol  1,000 Units Per Tube Q24H  . clopidogrel  75 mg Per Tube Q24H  . collagenase   Topical Daily  . donepezil  10 mg Per Tube BID  . enoxaparin (LOVENOX) injection  40 mg Subcutaneous Q24H  . free water  75 mL Per Tube Q4H  . hydrocortisone sod succinate (SOLU-CORTEF) inj  100 mg Intravenous Q12H  . insulin aspart  0-9 Units Subcutaneous Q4H  . insulin glargine  10 Units Subcutaneous Q24H  . Ipratropium-Albuterol  1 puff Inhalation Q6H WA  . lipase/protease/amylase  24,000 Units Oral Once  . mouth rinse  15 mL Mouth Rinse 10 times per day  . memantine  10 mg Per Tube BID  . metoprolol tartrate  2.5 mg Intravenous Q6H  . nystatin  5 mL Oral QID  . pantoprazole sodium  40 mg Per Tube Q24H  . potassium chloride  20 mEq Per Tube Q24H  . potassium chloride  40 mEq Per Tube Q4H  . ascorbic acid  250 mg Per Tube BID    Objective: Vital signs in last 24 hours: Temp:  [97.3 F (36.3 C)-99.9 F (37.7 C)] 98.6 F (37 C) (04/06 0900) Pulse Rate:  [86-109]  96 (04/06 0900) Resp:  [17-38] 20 (04/06 0900) BP: (114-153)/(65-92) 123/74 (04/06 0900) SpO2:  [97 %-100 %] 97 % (04/06 0600) Weight:  [51.9 kg] 51.9 kg (04/06 0500)  PHYSICAL EXAM:  General: extubated, non verbal  Lungs: B/l air entry Heart: s1s2 Abdomen: Soft, non-tender,not distended.  Extremities: atraumatic, no cyanosis. No edema. No clubbing Skin: No rashes or lesions. Or bruising Lymph: Cervical, supraclavicular normal. Neurologic: Grossly non-focal  Lab Results Recent Labs    11/26/18 0352 11/27/18 0514  WBC 10.7* 6.8  HGB 9.7* 10.1*  HCT 33.0* 33.3*  NA 146* 147*  K 4.1 2.4*  CL 117* 115*  CO2 22 25  BUN 20 14  CREATININE 0.47* 0.39*   Liver Panel No results for input(s): PROT, ALBUMIN, AST, ALT, ALKPHOS, BILITOT, BILIDIR, IBILI in the last 72 hours. Sedimentation Rate No results for input(s): ESRSEDRATE in the last 72 hours. C-Reactive Protein Recent Labs    11/26/18 0352  CRP 31.0*    Microbiology:  Studies/Results: Dg Chest Port 1 View  Result Date: 11/26/2018 CLINICAL DATA:  Respiratory failure. Intubated patient. Follow-up exam. EXAM: PORTABLE CHEST 1 VIEW COMPARISON:  Multiple prior exams, most recent dated 11/24/2018. FINDINGS: Prominent bronchovascular  markings most evident in the right lower lung. There are thickened interstitial markings with in both lung bases. Remainder of the lungs essentially clear. Small right pleural effusion. No pneumothorax. Endotracheal tube and nasal/orogastric tube are stable in well positioned. IMPRESSION: 1. No change in the appearance of the lungs from the most recent prior study. 2. Prominent bronchovascular markings with interstitial opacities in the right lung base, with milder interstitial opacities in the left lung base, are consistent with infection or aspiration. Small right pleural effusion. No new lung abnormalities. 3. Stable well-positioned support apparatus. Electronically Signed   By: Lajean Manes M.Maurice.    On: 11/26/2018 08:43     Assessment/Plan: 63 y.o.male  with a history of CVA, non verbal. dementia Peg, NH resident, CAD, CHF, CKD admitted from  NH with fever , cough and hypoxia-was in [facility] between 3/3-3/31 and discharged to [name] on 3/31  Acute hypoxic resp failure intubated  HCAP-/Aspiration pneumonia - on broad spectrum antibiotics- MRSA nares neg Currently on Cefepime  COVID 19-negative CVA recent intracerebral bleed?  ID WILL SIGN OFF- CALL IF NEEDED

## 2018-11-27 NOTE — Consult Note (Addendum)
Consultation Note Date: 11/27/2018   Patient Name: Maurice Patterson  DOB: Jun 03, 1956  MRN: [id number]  Age / Sex: 63 y.o., male  PCP: [name], MD Referring Physician: Gladstone Lighter, MD  Reason for Consultation: Establishing goals of care  HPI/Patient Profile: 63 y.o. male  with past medical history of dementia, CAD, diastolic CHF, CKD stage 3, diabetes, GERD, pancreatic cancer, seizure disorder, stroke, recurrent aspiration events with recurrent intubations admitted on 11/24/2018 with decreased responsiveness and fever requiring intubation in ED. Successfully extubated 11/26/18. Ongoing concern for aspiration pneumonia RLL with chronic feeding tube. Current concern for feeding tube clog (if needs replaced wife insists he needs J tube and not PEG). Continues with high risk of aspiration. Also with stage 3 sacral ulcer per RN.   Clinical Assessment and Goals of Care: Mr. Granja, "Heron Sabins," is in ICU and was successfully extubated yesterday (he has been intubated multiple times over the past few months). Breathing is regular with no distress on 3L nasal cannula. He tracks RN around room but does is not verbal. I observed patient via glass ICU door but did not go to bedside for full assessment. Full assessment was discussed with RN who has been at his bedside. Patient is confused, nonverbal and unable to participate in [place] conversation.   I spoke with wife, Cecille Rubin (Air traffic controller), via telephone who was pleasant but very frustrated. I allowed her time to voice her frustration about her husband's declining health over the past few months. She voices dissatisfaction with SNF care and feels that he would do better at home. She believes that if he returns home she can ensure that he is not laid flat and given the opportunity to aspirate and can ensure that he is obtaining his feeding properly to avoid aspiration  and also to ensure he is obtaining nutrition. She tells me that she has been discussing with someone from [facility] or [facility] [employer] about having a caregiver 4-6 hours during the weekdays so that she can have him at home. This is her main goal is that when he is optimized for discharge that he comes to their home. She wants full therapy services from PT, OT, SLP at home. She is also interested in having caregivers finalized and training on how to administer tube feedings. I will have CMRN/CSW assist with options for home services.   Cecille Rubin is clear that she desires full aggressive care stating "I can't let my husband die" but also stating "I know he doesn't have much longer." She verifies that she would want resuscitation and intubation again if indicated. We also discussed my concern that even with the best of care from her at home that he could continue to aspirate and decline and I encouraged her to consider if this happens how this would effect her decisions and goals of care for him. Encouraged that having him at home will allow her to see if he can improve (she needs to see this for herself) and if he declines further than at least  they will have that time together.   Primary Decision Maker NEXT OF KIN wife Cecille Rubin    SUMMARY OF RECOMMENDATIONS   - Full code, full aggressive care desired  Code Status/Advance Care Planning:  Full code   Symptom Management:   Per PCCM, attending  Palliative Prophylaxis:   Aspiration, Bowel Regimen, Delirium Protocol, Frequent Pain Assessment, Palliative Wound Care and Turn Reposition  Additional Recommendations (Limitations, Scope, Preferences):  Full Scope Treatment  Psycho-social/Spiritual:   Desire for further Chaplaincy support:no  Additional Recommendations: Caregiving  Support/Resources, Education on Hospice and Grief/Bereavement Support  Prognosis:   Prognosis is very poor and likely weeks to months.   Discharge Planning: To Be  Determined - desire to return home with home health but adequate caregiving resources will need to be in place before returning home     Primary Diagnoses: Present on Admission:  Acute respiratory failure ([place])   I have reviewed the medical record, interviewed the patient and family, and examined the patient. The following aspects are pertinent.  Past Medical History:  Diagnosis Date   Acute MI ([employer])    Arthritis    Back pain    CAD (coronary artery disease)    CHF (congestive heart failure) (HCC)    Chronic kidney disease    Degenerative lumbar disc    Dementia (HCC)    Diabetes mellitus without complication (HCC)    GERD (gastroesophageal reflux disease)    Hernia of abdominal cavity    Hyperlipemia    Hypertension    Malignant intraductal papillary mucinous tumor of pancreas ([place])    Memory loss    Pancreatitis    Seizures (HCC)    staring spells   Shingles    Stroke ([place]) 08/2017   TIA (transient ischemic attack)    Social History   Socioeconomic History   Marital status: Married    Spouse name: Not on file   Number of children: 2   Years of education: BS   Highest education level: Not on file  Occupational History   Occupation: Disabled  Scientist, product/process development strain: Not on file   Food insecurity:    Worry: Not on file    Inability: Not on file   Transportation needs:    Medical: Not on file    Non-medical: Not on file  Tobacco Use   Smoking status: Former Smoker    Last attempt to quit: 2013    Years since quitting: 7.2   Smokeless tobacco: Never Used   Tobacco comment: used to smoke 2PD for 40 yrs, quit about 4 years ago  Substance and Sexual Activity   Alcohol use: No    Alcohol/week: 0.0 standard drinks    Comment: occasional   Drug use: No   Sexual activity: Not on file  Lifestyle   Physical activity:    Days per week: Not on file    Minutes per session: Not on file   Stress: Not on file    Relationships   Social connections:    Talks on phone: Not on file    Gets together: Not on file    Attends religious service: Not on file    Active member of club or organization: Not on file    Attends meetings of clubs or organizations: Not on file    Relationship status: Not on file  Other Topics Concern   Not on file  Social History Narrative   Lives at home with wife and son. Ambulatory at  baseline.   Right-handed   Caffeine: 2 sodas per day   Family History  Problem Relation Age of Onset   Diabetes Mother    Hypertension Mother    CAD Mother    Hyperlipidemia Mother    Stroke Mother    ALS Mother    Alzheimer's disease Father    Diabetes Father    Heart Problems Brother    Scheduled Meds:  aspirin  81 mg Per Tube Q24H   chlorhexidine gluconate (MEDLINE KIT)  15 mL Mouth Rinse BID   Chlorhexidine Gluconate Cloth  6 each Topical Daily   cholecalciferol  1,000 Units Per Tube Q24H   clopidogrel  75 mg Per Tube Q24H   donepezil  10 mg Per Tube BID   enoxaparin (LOVENOX) injection  40 mg Subcutaneous Q24H   free water  75 mL Per Tube Q4H   hydrocortisone sod succinate (SOLU-CORTEF) inj  100 mg Intravenous Q12H   insulin aspart  0-9 Units Subcutaneous Q4H   insulin glargine  10 Units Subcutaneous Q24H   Ipratropium-Albuterol  1 puff Inhalation Q6H WA   lipase/protease/amylase  24,000 Units Oral Once   And   sodium bicarbonate  650 mg Per Tube Once   mouth rinse  15 mL Mouth Rinse 10 times per day   memantine  10 mg Per Tube BID   metoprolol tartrate  2.5 mg Intravenous Q6H   nystatin  5 mL Oral QID   pantoprazole sodium  40 mg Per Tube Q24H   potassium chloride  20 mEq Per Tube Q24H   potassium chloride  40 mEq Per Tube Q4H   ascorbic acid  250 mg Per Tube BID   Continuous Infusions:  ceFEPime (MAXIPIME) IV 2 g (11/27/18 6283)   dexmedetomidine (PRECEDEX) IV infusion Stopped (11/26/18 0307)   feeding supplement (GLUCERNA 1.5  CAL) Stopped (11/26/18 0811)   lactated ringers with kcl     magnesium sulfate 1 - 4 g bolus IVPB     norepinephrine (LEVOPHED) Adult infusion Stopped (11/26/18 1218)   potassium chloride 10 mEq (11/27/18 0913)   PRN Meds:.acetaminophen **OR** acetaminophen, bisacodyl, fentaNYL (SUBLIMAZE) injection, fentaNYL (SUBLIMAZE) injection, ondansetron **OR** ondansetron (ZOFRAN) IV, senna-docusate Allergies  Allergen Reactions   Hydrocodone Anaphylaxis   Morphine Other (See Comments)    Loss of memory   Quetiapine Other (See Comments)    Insomnia, agitation, anxiety   Ambien [Zolpidem] Other (See Comments)    delirium     Brilinta [Ticagrelor] Other (See Comments)    Stroke    Flexeril [Cyclobenzaprine] Other (See Comments)    delerium    Flunitrazepam Other (See Comments)    ROHYPNOL (hallucinations)   Haldol [Haloperidol Lactate] Other (See Comments)    delerium    Levetiracetam Diarrhea and Other (See Comments)    Unable to walk   Lorazepam Hives   Risperdal [Risperidone] Other (See Comments)    Delirium    Trazodone Other (See Comments)    Delirium Can take in low doses    Benadryl [Diphenhydramine Hcl (Sleep)] Rash   Penicillins Rash    Mouth ulcers Has patient had a PCN reaction causing immediate rash, facial/tongue/throat swelling, SOB or lightheadedness with hypotension: Yes Has patient had a PCN reaction causing severe rash involving mucus membranes or skin necrosis: No Has patient had a PCN reaction that required hospitalization No Has patient had a PCN reaction occurring within the last 10 years: Yes If all of the above answers are "NO", then may proceed with Cephalosporin  use.   Vancomycin Rash    Rash around IV site during vancomycin infusion   Review of Systems  Unable to perform ROS: Patient nonverbal    Physical Exam Vitals signs and nursing note reviewed.  Constitutional:      Appearance: He is cachectic. He is ill-appearing.      Comments: Frail   Cardiovascular:     Rate and Rhythm: Normal rate and regular rhythm.  Pulmonary:     Effort: No tachypnea, accessory muscle usage or respiratory distress.  Neurological:     Mental Status: He is alert.     Comments: + tracking, per RN he does not follow commands and is nonverbal     Vital Signs: BP 123/74    Pulse 96    Temp 98.6 F (37 C)    Resp 20    Ht _0  (1.676 m)    Wt 51.9 kg    SpO2 97%    BMI 18.47 kg/m  Pain Scale: CPOT       SpO2: SpO2: 97 % O2 Device:SpO2: 97 % O2 Flow Rate: .O2 Flow Rate (L/min): 3 L/min  IO: Intake/output summary:   Intake/Output Summary (Last 24 hours) at 11/27/2018 1018 Last data filed at 11/27/2018 0600 Gross per 24 hour  Intake 1474.87 ml  Output 1950 ml  Net -475.13 ml    LBM: Last BM Date: 11/26/18 Baseline Weight: Weight: 57 kg Most recent weight: Weight: 51.9 kg     Palliative Assessment/Data:     Time In/Out: 1040-1100 spent on unit (observing pt through glass and discussing with RN), 1130-1200 (time spent discussing with surrogate decision maker via telephone) Time Total: 50 min  Greater than 50%  of this time was spent counseling and coordinating care related to the above assessment and plan.  Signed by: Vinie Sill, NP Palliative Medicine Team Pager # [phone] (M-F 8a-5p) Team Phone # [phone] (Nights/Weekends)  The above conversation was completed via telephone due to the visitor restrictions during the COVID-19 pandemic. Thorough chart review and discussion with necessary members of the care team was completed as part of assessment. All issues were discussed and addressed but no physical exam was performed.

## 2018-11-27 NOTE — Progress Notes (Signed)
Nutrition Follow-up  RD working remotely.  DOCUMENTATION CODES:   Severe malnutrition in context of acute illness/injury  INTERVENTION:  Once tube is unclogged, initiate Glucerna 1.5 Cal at 50 mL/hr. Provides 1800 kcal, 99 grams of protein, 912 mL H2O daily.  With free water flush of 75 mL Q4hrs patient will receive a total of 1362 mL H2O daily including water in tube feeds.  NUTRITION DIAGNOSIS:   Severe Malnutrition related to chronic illness as evidenced by moderate fat depletion, moderate muscle depletion, percent weight loss.  Ongoing - addressing with TF regimen.  GOAL:   Provide needs based on ASPEN/SCCM guidelines  Met with TF regimen.  MONITOR:   Vent status, Labs, Weight trends, I & O's, Skin, TF tolerance  REASON FOR ASSESSMENT:   Consult Assessment of nutrition requirement/status  ASSESSMENT:   63 y.o. male with a history of CVA, non verbal. dementia, s/p PEG, NH resident, CAD, CHF, CKD admitted from SNF with fever, cough and hypoxia   -Still pending COVID-19 results.  Patient was extubated on 4/5. OGT was removed at time of extubation (this is where TFs had been infusing). PEG tube not functioning at this time and is clogged. Orders have been entered for unclog protocol, which RN will try today. Abdomen soft per RN assessment. Patient had a medium type 6 BM today. Reviewed paper chart from facility and patient's usual regimen was Glucerna 1.5 Cal at 50 mL/hr.  Enteral Access: G-tube present on admission; currently clogged  TF: pt was previously tolerating Glucerna 1.5 @ 65 mL/hr + free water flush 75 mL Q4hrs  Medications reviewed and include: D-Vi-Sol 1000 units daily per tube, Novolog 0-9 units Q4hrs, Lantus 10 units daily, Protonix 40 mg daily per tube, vitamin C 250 mg BID per tube, cefepime, LR @ 75 mL/hr, magnesium sulfate 2 grams IV once today, potassium chloride 10 mEQ IV 5 times today.  Labs reviewed: CBG 130-185, Sodium 147, Potassium 2.4,  Chloride 115, Creatinine 0.39.  I/O: 2350 ml UOP yesterday (1.9 mL/kg/hr)  Weight trend: 51.9 kg on 4/6; -2 kg from admission  Discussed with RN and MD.  Diet Order:   Diet Order            Diet NPO time specified  Diet effective now             EDUCATION NEEDS:   Not appropriate for education at this time  Skin:  Skin Assessment: Skin Integrity Issues:(DTI to coccyx)  Last BM:  11/27/2018 - medium type 6  Height:   Ht Readings from Last 1 Encounters:  11/24/18 '5\' 6"'$  (1.676 m)   Weight:   Wt Readings from Last 1 Encounters:  11/27/18 51.9 kg   Ideal Body Weight:  64.5 kg  BMI:  Body mass index is 18.47 kg/m.  Estimated Nutritional Needs:   Kcal:  1700-1900  Protein:  80-95 grams  Fluid:  1.5-1.7 L/day  Willey Blade, MS, RD, LDN Office: [phone] Pager: [phone] After Hours/Weekend Pager: [phone]

## 2018-11-27 NOTE — Consult Note (Signed)
[facility] Nurse wound consult note   Consultation completed by use of remote telehealth camera cart/Elink technology and with assistance from bedside nurse/clinical staff  Reason for Consult: sacral ulcer, patient from SNF Wound type:Unstageable pressure injury x 1, Stage 3 pressure injuries x 2 Pressure Injury POA: Yes Measurement: see nursing flow sheets Wound GTX:MIWOEH; 100% black soft necrotic tissue; distal right buttock x 2 100% pink, but full thickness Drainage (amount, consistency, odor) unable to assess, dressing removed by bedside nurse.  Periwound: evidence of further skin injury and related moisture associated skin damage. Dressing procedure/placement/frequency: Add enzymatic debridement ointment for the sacral wound, feel that surgical debridement would not be warranted at this time due to patient's current condition, consider if patient improves Progressa ICU low air loss mattress in place for moisture management and pressure redistribution Maximize nutrition for wound healing when appropriate. Air mattress if patient transfers from the ICU Moisture barrier cream for other pressure injuries on the buttocks due to location   Discussed POC with bedside nurse.  Re consult if needed, will not follow at this time. Thanks  Janessa Mickle [employer], RN,CWOCN, CNS, [place] [phone])

## 2018-11-27 NOTE — Progress Notes (Signed)
PEG tube unclogged. Meds given, will continue to monitor.

## 2018-11-27 NOTE — Consult Note (Signed)
PHARMACY CONSULT NOTE - FOLLOW UP  Pharmacy Consult for Electrolyte Monitoring and Replacement   Recent Labs: Potassium (mmol/L)  Date Value  11/27/2018 2.9 (L)  12/17/2014 3.7   Magnesium (mg/dL)  Date Value  11/27/2018 1.8  12/17/2014 1.8   Calcium (mg/dL)  Date Value  11/27/2018 7.7 (L)   Calcium, Total (mg/dL)  Date Value  12/17/2014 7.9 (L)   Albumin (g/dL)  Date Value  11/24/2018 2.5 (L)  10/20/2016 4.5  12/17/2014 3.2 (L)   Phosphorus (mg/dL)  Date Value  11/27/2018 2.7   Sodium (mmol/L)  Date Value  11/27/2018 147 (H)  10/20/2016 146 (H)  12/17/2014 138   Assessment: Potassium 61mEq IV Q1hr x 5 doses. Potassium 46mEq VT x 1. MIVF transitioned to LR/7mEq. Magnesium 2g IV x 1.   Electrolytes with am labs.   Brantlee Penn L 11/27/2018 5:28 PM

## 2018-11-27 NOTE — Consult Note (Signed)
Pharmacy Antibiotic Note  Maurice Patterson is a 63 y.o. male admitted on 11/24/2018 with HAP.  Pharmacy has been consulted for Cefepime dosing. Patient admitted from [facility].   Plan: Cefepime 2 g every 8 hours.  Height: 5\' 6"  (167.6 cm) Weight: 114 lb 6.7 oz (51.9 kg) IBW/kg (Calculated) : 63.8  Temp (24hrs), Avg:98 F (36.7 C), Min:97.3 F (36.3 C), Max:98.8 F (37.1 C)  Recent Labs  Lab 11/24/18 1314 11/24/18 1700 11/25/18 0440 11/26/18 0352 11/27/18 0514  WBC 12.8*  --  16.4* 10.7* 6.8  CREATININE 0.60*  --  0.55* 0.47* 0.39*  LATICACIDVEN 1.4 2.4*  --   --   --     Estimated Creatinine Clearance: 71.2 mL/min (A) (by C-G formula based on SCr of 0.39 mg/dL (L)).    Allergies  Allergen Reactions  . Hydrocodone Anaphylaxis  . Morphine Other (See Comments)    Loss of memory  . Quetiapine Other (See Comments)    Insomnia, agitation, anxiety  . Ambien [Zolpidem] Other (See Comments)    delirium    . Brilinta [Ticagrelor] Other (See Comments)    Stroke   . Flexeril [Cyclobenzaprine] Other (See Comments)    delerium   . Flunitrazepam Other (See Comments)    ROHYPNOL (hallucinations)  . Haldol [Haloperidol Lactate] Other (See Comments)    delerium   . Levetiracetam Diarrhea and Other (See Comments)    Unable to walk  . Lorazepam Hives  . Risperdal [Risperidone] Other (See Comments)    Delirium   . Trazodone Other (See Comments)    Delirium Can take in low doses   . Benadryl [Diphenhydramine Hcl (Sleep)] Rash  . Penicillins Rash    Mouth ulcers Has patient had a PCN reaction causing immediate rash, facial/tongue/throat swelling, SOB or lightheadedness with hypotension: Yes Has patient had a PCN reaction causing severe rash involving mucus membranes or skin necrosis: No Has patient had a PCN reaction that required hospitalization No Has patient had a PCN reaction occurring within the last 10 years: Yes If all of the above answers are "NO", then may proceed with  Cephalosporin use.  . Vancomycin Rash    Rash around IV site during vancomycin infusion    Antimicrobials this admission: Azithromycin 4/3 >> 4/6 Clindamycin 4/3 >> 4/5 Cefepime 4/3 >> 4/5 Doxycycline 4/5 >> 4/6   Dose adjustments this admission: N/A  Microbiology results: 4/3 BCx: no growth x 3 days  4/4 MRSA PCR: negative  4/3 COVID: pending   Thank you for allowing pharmacy to be a part of this patient's care.  Helayne Metsker L 11/27/2018 10:01 PM

## 2018-11-27 NOTE — Progress Notes (Signed)
[place] at [place] NAME: Maurice Patterson    MR#:  [id number]  DATE OF BIRTH:  [birth year]  SUBJECTIVE:  CHIEF COMPLAINT:   Chief Complaint  Patient presents with  . Fever    unresponsive   -Patient with possible dementia, brought from nursing home for acute respiratory distress and unresponsiveness.  Was intubated on admission.  Extubated yesterday and is currently on 3 L oxygen -On precautions for COVID-19,  as the test is pending  REVIEW OF SYSTEMS:  Review of Systems  Unable to perform ROS: Patient nonverbal    DRUG ALLERGIES:   Allergies  Allergen Reactions  . Hydrocodone Anaphylaxis  . Morphine Other (See Comments)    Loss of memory  . Quetiapine Other (See Comments)    Insomnia, agitation, anxiety  . Ambien [Zolpidem] Other (See Comments)    delirium    . Brilinta [Ticagrelor] Other (See Comments)    Stroke   . Flexeril [Cyclobenzaprine] Other (See Comments)    delerium   . Flunitrazepam Other (See Comments)    ROHYPNOL (hallucinations)  . Haldol [Haloperidol Lactate] Other (See Comments)    delerium   . Levetiracetam Diarrhea and Other (See Comments)    Unable to walk  . Lorazepam Hives  . Risperdal [Risperidone] Other (See Comments)    Delirium   . Trazodone Other (See Comments)    Delirium Can take in low doses   . Benadryl [Diphenhydramine Hcl (Sleep)] Rash  . Penicillins Rash    Mouth ulcers Has patient had a PCN reaction causing immediate rash, facial/tongue/throat swelling, SOB or lightheadedness with hypotension: Yes Has patient had a PCN reaction causing severe rash involving mucus membranes or skin necrosis: No Has patient had a PCN reaction that required hospitalization No Has patient had a PCN reaction occurring within the last 10 years: Yes If all of the above answers are "NO", then may proceed with Cephalosporin use.  . Vancomycin Rash    Rash around IV site during vancomycin infusion     VITALS:  Blood pressure 123/74, pulse 96, temperature 98.6 F (37 C), resp. rate 20, height 5\' 6"  (1.676 m), weight 51.9 kg, SpO2 97 %.  PHYSICAL EXAMINATION:  Physical Exam   GENERAL:  63 y.o.-year-old ill appearing patient lying in the bed with no acute distress.  EYES: Pupils equal, round, reactive to light and accommodation. No scleral icterus. Extraocular muscles intact.  HEENT: Head atraumatic, normocephalic. Oropharynx and nasopharynx clear.  NECK:  Supple, no jugular venous distention. No thyroid enlargement, no tenderness.  LUNGS: Normal breath sounds bilaterally, no wheezing, rales,rhonchi or crepitation. No use of accessory muscles of respiration. Decreased bibasilar breath sounds CARDIOVASCULAR: S1, S2 normal. No  rubs, or gallops. 2/6 systolic murmur present ABDOMEN: Soft, nontender, nondistended. Bowel sounds present. Has  PEG tube in place. No organomegaly or mass.  EXTREMITIES: No pedal edema, cyanosis, or clubbing.  NEUROLOGIC: Patient is alert, tracking, not following commands or speaking  PSYCHIATRIC: The patient is alert .  SKIN: No obvious rash, lesion, or ulcer.    LABORATORY PANEL:   CBC Recent Labs  Lab 11/27/18 0514  WBC 6.8  HGB 10.1*  HCT 33.3*  PLT 243   ------------------------------------------------------------------------------------------------------------------  Chemistries  Recent Labs  Lab 11/24/18 1314  11/27/18 0514 11/27/18 1325  NA 143   < > 147*  --   K 4.5   < > 2.4* 2.9*  CL 107   < > 115*  --  CO2 29   < > 25  --   GLUCOSE 405*   < > 148*  --   BUN 20   < > 14  --   CREATININE 0.60*   < > 0.39*  --   CALCIUM 8.2*   < > 7.7*  --   MG  --    < > 1.8  --   AST 27  --   --   --   ALT 14  --   --   --   ALKPHOS 136*  --   --   --   BILITOT 0.7  --   --   --    < > = values in this interval not displayed.   ------------------------------------------------------------------------------------------------------------------   Cardiac Enzymes Recent Labs  Lab 11/24/18 1314  TROPONINI <0.03   ------------------------------------------------------------------------------------------------------------------  RADIOLOGY:  Dg Chest Port 1 View  Result Date: 11/26/2018 CLINICAL DATA:  Respiratory failure. Intubated patient. Follow-up exam. EXAM: PORTABLE CHEST 1 VIEW COMPARISON:  Multiple prior exams, most recent dated 11/24/2018. FINDINGS: Prominent bronchovascular markings most evident in the right lower lung. There are thickened interstitial markings with in both lung bases. Remainder of the lungs essentially clear. Small right pleural effusion. No pneumothorax. Endotracheal tube and nasal/orogastric tube are stable in well positioned. IMPRESSION: 1. No change in the appearance of the lungs from the most recent prior study. 2. Prominent bronchovascular markings with interstitial opacities in the right lung base, with milder interstitial opacities in the left lung base, are consistent with infection or aspiration. Small right pleural effusion. No new lung abnormalities. 3. Stable well-positioned support apparatus. Electronically Signed   By: Lajean Manes M.D.   On: 11/26/2018 08:43    EKG:   Orders placed or performed during the hospital encounter of 11/24/18  . ED EKG 12-Lead  . ED EKG 12-Lead  . EKG 12-Lead  . EKG 12-Lead    ASSESSMENT AND PLAN:   63 year old male with past medical history significant for rapidly progressive dementia, nonverbal at baseline, history of stroke, CAD, CKD, CHF, recent admission to [facility] for intraparenchymal left cerebral hemorrhage comes from nursing home secondary to sepsis secondary to pneumonia  1.  Acute hypoxic respiratory failure on admission-secondary to pneumonia, concern for aspiration. -Patient was intubated on admission, extubated on 11/26/2018 and is currently on 3 L oxygen -Patient is a COVID-19 rule out, test is pending at this time. -Appreciate ID input.  Currently on  broad-spectrum antibiotics with cefepime, clindamycin and azithromycin -Continue droplet and contact precautions at this time.  2.  Nonverbal and rapidly progressive dementia at baseline-seems to be at baseline, alert and tracking today. -Continue Aricept and Namenda  3.  Sepsis-secondary to pneumonia -On stress dose steroids and also antibiotics -On low-dose pressors  4.  Diabetes mellitus-on Lantus and sliding scale insulin  5.  Concern for PEG tube occlusion-GI input pending  6.  DVT prophylaxis-on Lovenox  9.  Hypokalemia-being replaced  Overall poor prognosis with recurrent long hospitalizations.  Palliative care consult is pending   All the records are reviewed and case discussed with Care Management/Social Workerr. Management plans discussed with the patient, family and they are in agreement.  CODE STATUS: Full Code  TOTAL TIME TAKING CARE OF THIS PATIENT: 39 minutes.   POSSIBLE D/C IN ? DAYS, DEPENDING ON CLINICAL CONDITION.   Gladstone Lighter M.D on 11/27/2018 at 2:29 PM  Between 7am to 6pm - Pager - [phone]  After 6pm go to [email or web address] - password [email or web address]  [facility]  Office  [phone]  CC: Primary care physician; [name], MD

## 2018-11-27 NOTE — Care Plan (Signed)
Patient well-known to our service with a history of apically progressing dementia.  He is nonverbal at baseline.  Recently admitted to [facility] between 10/24/18 discharged on 11/21/2018 after a left cerebral hemorrhage was noted.  He has been with decreased level of consciousness since then. Despite PEG tube he has episodes of aspiration.  Basically appears to be mostly on a chronic vegetative state.  He was discussed today at multidisciplinary rounds.  He was extubated yesterday after he was intubated for another episode of aspiration.  His antibiotics have been reviewed with the ICU pharmacist.  Antibiotics will be de-escalated to cefepime.  COVID-19 testing is pending however suspect that this will be negative.  The patient has remained stable with regards to his respiratory status since extubation.  His prognosis overall is dismal and recommend progression to palliation/hospice care.  From our standpoint, the patient may be transferred out of the ICU.  No further PCCM interventions are necessary at this point.

## 2018-11-28 LAB — BASIC METABOLIC PANEL
Anion gap: 8 (ref 5–15)
BUN: 15 mg/dL (ref 8–23)
CO2: 24 mmol/L (ref 22–32)
Calcium: 7.8 mg/dL — ABNORMAL LOW (ref 8.9–10.3)
Chloride: 115 mmol/L — ABNORMAL HIGH (ref 98–111)
Creatinine, Ser: <0.3 mg/dL — ABNORMAL LOW (ref 0.61–1.24)
Glucose, Bld: 204 mg/dL — ABNORMAL HIGH (ref 70–99)
Potassium: 3.5 mmol/L (ref 3.5–5.1)
Sodium: 147 mmol/L — ABNORMAL HIGH (ref 135–145)

## 2018-11-28 LAB — CBC
HCT: 36.2 % — ABNORMAL LOW (ref 39.0–52.0)
Hemoglobin: 10.9 g/dL — ABNORMAL LOW (ref 13.0–17.0)
MCH: 28.2 pg (ref 26.0–34.0)
MCHC: 30.1 g/dL (ref 30.0–36.0)
MCV: 93.5 fL (ref 80.0–100.0)
Platelets: 268 10*3/uL (ref 150–400)
RBC: 3.87 MIL/uL — ABNORMAL LOW (ref 4.22–5.81)
RDW: 16.5 % — ABNORMAL HIGH (ref 11.5–15.5)
WBC: 6.6 10*3/uL (ref 4.0–10.5)
nRBC: 0 % (ref 0.0–0.2)

## 2018-11-28 LAB — PHOSPHORUS: Phosphorus: 1.7 mg/dL — ABNORMAL LOW (ref 2.5–4.6)

## 2018-11-28 LAB — GLUCOSE, CAPILLARY
Glucose-Capillary: 136 mg/dL — ABNORMAL HIGH (ref 70–99)
Glucose-Capillary: 181 mg/dL — ABNORMAL HIGH (ref 70–99)
Glucose-Capillary: 183 mg/dL — ABNORMAL HIGH (ref 70–99)
Glucose-Capillary: 190 mg/dL — ABNORMAL HIGH (ref 70–99)
Glucose-Capillary: 202 mg/dL — ABNORMAL HIGH (ref 70–99)
Glucose-Capillary: 233 mg/dL — ABNORMAL HIGH (ref 70–99)
Glucose-Capillary: 266 mg/dL — ABNORMAL HIGH (ref 70–99)

## 2018-11-28 LAB — NOVEL CORONAVIRUS, NAA (HOSP ORDER, SEND-OUT TO REF LAB; TAT 18-24 HRS): SARS-CoV-2, NAA: NOT DETECTED

## 2018-11-28 LAB — MAGNESIUM: Magnesium: 2 mg/dL (ref 1.7–2.4)

## 2018-11-28 MED ORDER — POTASSIUM PHOSPHATE MONOBASIC 500 MG PO TABS
500.0000 mg | ORAL_TABLET | Freq: Once | ORAL | Status: AC
  Administered 2018-11-28: 500 mg via ORAL
  Filled 2018-11-28: qty 1

## 2018-11-28 MED ORDER — K PHOS MONO-SOD PHOS DI & MONO 155-852-130 MG PO TABS
250.0000 mg | ORAL_TABLET | Freq: Once | ORAL | Status: AC
  Administered 2018-11-28: 250 mg via ORAL
  Filled 2018-11-28 (×2): qty 1

## 2018-11-28 MED ORDER — POTASSIUM PHOSPHATE MONOBASIC 500 MG PO TABS
1000.0000 mg | ORAL_TABLET | Freq: Once | ORAL | Status: AC
  Administered 2018-11-28: 1000 mg via ORAL
  Filled 2018-11-28: qty 2

## 2018-11-28 MED ORDER — POTASSIUM PHOSPHATE MONOBASIC 500 MG PO TABS
250.0000 mg | ORAL_TABLET | Freq: Once | ORAL | Status: DC
  Filled 2018-11-28: qty 1

## 2018-11-28 MED ORDER — POTASSIUM PHOSPHATE MONOBASIC 500 MG PO TABS
500.0000 mg | ORAL_TABLET | Freq: Once | ORAL | Status: DC
  Filled 2018-11-28: qty 1

## 2018-11-28 NOTE — Consult Note (Signed)
PHARMACY CONSULT NOTE - FOLLOW UP  Pharmacy Consult for Electrolyte Monitoring and Replacement   Recent Labs: Potassium (mmol/L)  Date Value  11/28/2018 3.5  12/17/2014 3.7   Magnesium (mg/dL)  Date Value  11/28/2018 2.0  12/17/2014 1.8   Calcium (mg/dL)  Date Value  11/28/2018 7.8 (L)   Calcium, Total (mg/dL)  Date Value  12/17/2014 7.9 (L)   Albumin (g/dL)  Date Value  11/24/2018 2.5 (L)  10/20/2016 4.5  12/17/2014 3.2 (L)   Phosphorus (mg/dL)  Date Value  11/28/2018 1.7 (L)   Sodium (mmol/L)  Date Value  11/28/2018 147 (H)  10/20/2016 146 (H)  12/17/2014 138   Assessment: S/p potassium and magnesium replenishment, those electrolytes are WNL. Additionally, he is currently on LR/4mEq and 20 mEq via PEG tube. Per chart review, PEG tube is unclogged at this time. Patient does not need magnesium replacement at the moment. However, his phosphorus is level is significantly low and needs replenishment.  Plan:  Continue current potassium regimen of LR/40 mEq IV continuous infusion and 20 mEq via PEG tube  Will not replace magnesium.   Patient received KPhos 250 mg this AM. Will order an additional 500 mg (1 tab) dose followed by 1000 mg (2 tab) @2000 .    Will follow electrolytes with AM labs.   Oswald Hillock, PharmD, BCPS 11/28/2018 1:10 PM

## 2018-11-28 NOTE — Care Management Important Message (Signed)
Important Message  Patient Details  Name: Maurice Patterson MRN: [id number] Date of Birth: Nov 27, 1955   Medicare Important Message Given:  Yes    Ross Ludwig, LCSW 11/28/2018, 1:33 PM

## 2018-11-28 NOTE — Care Management Important Message (Signed)
Important Message  Patient Details  Name: CEYLON ARENSON MRN: [id number] Date of Birth: Jan 09, 1956   Medicare Important Message Given:  Yes   Patient is isolation room, family aware of medicare rights.    Ross Ludwig, LCSW 11/28/2018, 1:28 PM

## 2018-11-28 NOTE — TOC Initial Note (Signed)
Transition of Care [facility]) - Initial/Assessment Note    Patient Details  Name: Maurice Patterson MRN: [id number] Date of Birth: Oct 14, 1955  Transition of Care [facility]) CM/SW Contact:    Elza Rafter, RN Phone Number: 11/28/2018, 9:56 AM  Clinical Narrative:       Patient is well known to [facility]. Transferred to 2A last night from ICU.   Spoke to patient's wife Cecille Rubin this morning.  She is very frustrated with the series of events over the last couple of months invilving her husband and medical care.  She states he was dropped at [employer] which resulted in a brain bleed.  He spent several weeks at [facility] and was discharged to [facility].  She states [facility] administered feeding through his J/G tube while laying flat and he aspirated.  She states he became febrile-105 temperate and 32 RR and was brought to [facility].  She is upset that they transferred him out of ICU last night as she states she was told he would remain there for several days.     She would like to take her husband home when medically ready and care for him.  She states she has family support.  She has spoken to [place] with [facility].  She is also working with [facility] closely and they are assisting her with providing extra nursing care in the home 4-6/day.  She states they will send a doctor out to the house.  Cecille Rubin gave this RNCM a [facility] name-Madonna with phone number [phone] number is a wrong number.    Referral to [facility] with [facility].  Notified him of information Cecille Rubin gave this RNCM in regards to extra care and support.    Cecille Rubin would like multiple DME for in the home at discharge-wheelchair, hospital bed, feeding pump and supplies, lift, suction machine.  Referral to [facility].  Will continue to follow closely.  Cecille Rubin has my cell number.         Expected Discharge Plan: [place] Barriers to Discharge: Continued Medical Work up   Patient Goals and CMS  Choice Patient states their goals for this hospitalization and ongoing recovery are:: Cecille Rubin, wife would like patient to DC to home with her when medically ready CMS Medicare.gov Compare Post Acute Care list provided to:: Patient Choice offered to / list presented to : Spouse  Expected Discharge Plan and Services Expected Discharge Plan: [place] In-house Referral: [facility] Discharge Planning Services: CM Consult Post Acute Care Choice: Home Health, Durable Medical Equipment Living arrangements for the past 2 months: Single Family Home                 DME Arranged: Youth worker wheelchair with seat cushion, Hospital bed, Suction, Tube feeding, Tube feeding pump([place]oyer Lift) DME Agency: [facility] HH Arranged: RN, PT, OT, Nurse's Aide [place] Agency: [place] ([facility])  Prior Living Arrangements/Services Living arrangements for the past 2 months: Single Family Home Lives with:: Spouse          Need for Family Participation in Patient Care: Yes (Comment)(Wife [name]) Care giver support system in place?: Yes (comment)(wife, Cecille Rubin)   Criminal Activity/Legal Involvement Pertinent to Current Situation/Hospitalization: No - Comment as needed  Activities of Daily Living      Permission Sought/Granted Permission sought to share information with : Investment banker, corporate granted to share info w AGENCY: Corene Cornea with [facility] and  Brad with [facility]        Emotional Assessment Appearance:: Appears older than stated age       Alcohol / Substance Use: Not Applicable    Admission diagnosis:  Acute respiratory failure with hypoxia ([place]) [J96.01] Sepsis with acute hypoxic respiratory failure without septic shock, due to unspecified organism ([place]) [A41.9, R65.20, J96.01] Acute respiratory failure ([place]) [J96.00] Patient Active Problem List   Diagnosis Date Noted  . Acute respiratory failure ([place]) 11/24/2018  .  Respiratory failure requiring intubation ([place]) 10/24/2018  . Protein-calorie malnutrition, severe 09/20/2018  . Diabetes ([place]) 09/19/2018  . Sepsis ([place]) 09/19/2018  . Pressure injury of skin 08/12/2018  . Aspiration pneumonia ([place])   . Palliative care encounter   . Dysphagia   . Acute respiratory failure with hypoxia ([place])   . Fever   . CAP (community acquired pneumonia) 07/22/2018  . GERD (gastroesophageal reflux disease) 07/22/2018  . Hypersalivation 01/30/2018  . Drooling 01/30/2018  . Alzheimer's dementia ([place]) 05/01/2017  . Cognitive decline 10/20/2016  . Chest pain, rule out acute myocardial infarction 04/05/2016  . Confusion 10/18/2015  . Constipation 08/23/2015  . Uncontrolled type 2 diabetes mellitus with hyperglycemia, with long-term current use of insulin ([place]) 08/23/2015  . Essential hypertension 08/23/2015  . Right lower lobe pneumonia ([place]) 08/23/2015  . Abdominal pain 08/21/2015  . Altered mental status 02/19/2015   PCP:  [name], MD Pharmacy:   [facility] [phone] - [address], [address] [place] [address] Phone: [phone] Fax: [phone]     Social Determinants of Health (SDOH) Interventions    Readmission Risk Interventions No flowsheet data found.

## 2018-11-28 NOTE — Consult Note (Addendum)
PHARMACY CONSULT NOTE - FOLLOW UP  Pharmacy Consult for Electrolyte Monitoring and Replacement   Recent Labs: Potassium (mmol/L)  Date Value  11/28/2018 3.5  12/17/2014 3.7   Magnesium (mg/dL)  Date Value  11/28/2018 2.0  12/17/2014 1.8   Calcium (mg/dL)  Date Value  11/28/2018 7.8 (L)   Calcium, Total (mg/dL)  Date Value  12/17/2014 7.9 (L)   Albumin (g/dL)  Date Value  11/24/2018 2.5 (L)  10/20/2016 4.5  12/17/2014 3.2 (L)   Phosphorus (mg/dL)  Date Value  11/28/2018 1.7 (L)   Sodium (mmol/L)  Date Value  11/28/2018 147 (H)  10/20/2016 146 (H)  12/17/2014 138   Assessment: S/p potassium and magnesium replenishment, those electrolytes are WNL. Additionally, he is currently on LR/60mEq and 20 mEq via PEG tube. Per chart review, PEG tube is unclogged at this time. Patient does not need magnesium replacement at the moment. However, his phosphorus level is significantly low and needs replenishment.  Plan:  Continue current potassium regimen of LR/40 mEq IV continuous infusion and 20 mEq via PEG tube  Will not replace magnesium.   Start 250 mg Potassium Phosphate x 2 (4 hours apart) via Peg tube.   Will follow electrolytes with AM labs.   Rowland Lathe, PharmD 11/28/2018 7:39 AM

## 2018-11-28 NOTE — Progress Notes (Signed)
[name] NAME: Maurice Patterson    MR#:  [id number]  DATE OF BIRTH:  [birth year]  SUBJECTIVE:  CHIEF COMPLAINT:   Chief Complaint  Patient presents with  . Fever    unresponsive   -Patient with possible dementia, brought from nursing home for acute respiratory distress and unresponsiveness.  - Was intubated on admission.  Extubated and is currently on 3 L oxygen -covid 19 test is negative  REVIEW OF SYSTEMS:  Review of Systems  Unable to perform ROS: Patient nonverbal    DRUG ALLERGIES:   Allergies  Allergen Reactions  . Hydrocodone Anaphylaxis  . Morphine Other (See Comments)    Loss of memory  . Quetiapine Other (See Comments)    Insomnia, agitation, anxiety  . Ambien [Zolpidem] Other (See Comments)    delirium    . Brilinta [Ticagrelor] Other (See Comments)    Stroke   . Flexeril [Cyclobenzaprine] Other (See Comments)    delerium   . Flunitrazepam Other (See Comments)    ROHYPNOL (hallucinations)  . Haldol [Haloperidol Lactate] Other (See Comments)    delerium   . Levetiracetam Diarrhea and Other (See Comments)    Unable to walk  . Lorazepam Hives  . Risperdal [Risperidone] Other (See Comments)    Delirium   . Trazodone Other (See Comments)    Delirium Can take in low doses   . Benadryl [Diphenhydramine Hcl (Sleep)] Rash  . Penicillins Rash    Mouth ulcers Has patient had a PCN reaction causing immediate rash, facial/tongue/throat swelling, SOB or lightheadedness with hypotension: Yes Has patient had a PCN reaction causing severe rash involving mucus membranes or skin necrosis: No Has patient had a PCN reaction that required hospitalization No Has patient had a PCN reaction occurring within the last 10 years: Yes If all of the above answers are "NO", then may proceed with Cephalosporin use.  . Vancomycin Rash    Rash around IV site during vancomycin infusion    VITALS:  Blood pressure (!)  161/98, pulse 84, temperature 98.3 F (36.8 C), temperature source Oral, resp. rate (!) 22, height 5\' 6"  (1.676 m), weight 57 kg, SpO2 94 %.  PHYSICAL EXAMINATION:  Physical Exam   GENERAL:  63 y.o.-year-old ill appearing patient lying in the bed with no acute distress.  EYES: Pupils equal, round, reactive to light and accommodation. No scleral icterus. Extraocular muscles intact.  HEENT: Head atraumatic, normocephalic. Oropharynx and nasopharynx clear.  NECK:  Supple, no jugular venous distention. No thyroid enlargement, no tenderness.  LUNGS: Normal breath sounds bilaterally, no wheezing, rales,rhonchi or crepitation. No use of accessory muscles of respiration. Decreased bibasilar breath sounds CARDIOVASCULAR: S1, S2 normal. No  rubs, or gallops. 2/6 systolic murmur present ABDOMEN: Soft, nontender, nondistended. Bowel sounds present. Has  PEG tube in place. No organomegaly or mass.  EXTREMITIES: No pedal edema, cyanosis, or clubbing.  NEUROLOGIC: Patient is alert, tracking, not following commands or speaking  PSYCHIATRIC: The patient is alert .  SKIN: No obvious rash, lesion, or ulcer.    LABORATORY PANEL:   CBC Recent Labs  Lab 11/28/18 0338  WBC 6.6  HGB 10.9*  HCT 36.2*  PLT 268   ------------------------------------------------------------------------------------------------------------------  Chemistries  Recent Labs  Lab 11/24/18 1314  11/28/18 0338  NA 143   < > 147*  K 4.5   < > 3.5  CL 107   < > 115*  CO2 29   < > 24  GLUCOSE 405*   < > 204*  BUN 20   < > 15  CREATININE 0.60*   < > <0.30*  CALCIUM 8.2*   < > 7.8*  MG  --    < > 2.0  AST 27  --   --   ALT 14  --   --   ALKPHOS 136*  --   --   BILITOT 0.7  --   --    < > = values in this interval not displayed.   ------------------------------------------------------------------------------------------------------------------  Cardiac Enzymes Recent Labs  Lab 11/24/18 1314  TROPONINI <0.03    ------------------------------------------------------------------------------------------------------------------  RADIOLOGY:  No results found.  EKG:   Orders placed or performed during the hospital encounter of 11/24/18  . ED EKG 12-Lead  . ED EKG 12-Lead  . EKG 12-Lead  . EKG 12-Lead    ASSESSMENT AND PLAN:   63 year old male with past medical history significant for rapidly progressive dementia, nonverbal at baseline, history of stroke, CAD, CKD, CHF, recent admission to [facility] for intraparenchymal left cerebral hemorrhage comes from nursing home secondary to sepsis secondary to pneumonia  1.  Acute hypoxic respiratory failure on admission-secondary to pneumonia, concern for aspiration. -Patient was intubated on admission, extubated on 11/26/2018 and is currently on 3 L oxygen  COVID-19 negative. -Appreciate ID input.  Currently on cefepime  2.  Nonverbal and rapidly progressive dementia at baseline-seems to be at baseline, alert and tracking. -Continue Aricept and Namenda  3.  Sepsis-secondary to pneumonia -discontinue stress dose steroids - on antibiotics -Off pressors  4.  Diabetes mellitus-on Lantus and sliding scale insulin  5.  Concern for PEG tube occlusion- fixed, tube feeds restarted  6.  DVT prophylaxis-on Lovenox  9.  Hypokalemia- replaced  Overall poor prognosis with recurrent long hospitalizations.  Palliative care consult is appreciated Patient remains a full code, plan for discharge home from hospital   All the records are reviewed and case discussed with Care Management/Social Workerr. Management plans discussed with the patient, family and they are in agreement.  CODE STATUS: Full Code  TOTAL TIME TAKING CARE OF THIS PATIENT: 37 minutes.   POSSIBLE D/C IN ? DAYS, DEPENDING ON CLINICAL CONDITION.   Gladstone Lighter M.D on 11/28/2018 at 12:54 PM  Between 7am to 6pm - Pager - [phone]  After 6pm go to [email or web address] - password [email or web address] [facility]  Office  [phone]  CC: Primary care physician; [name], MD

## 2018-11-29 ENCOUNTER — Inpatient Hospital Stay: Payer: PPO

## 2018-11-29 LAB — CULTURE, BLOOD (ROUTINE X 2)
Culture: NO GROWTH
Culture: NO GROWTH
Special Requests: ADEQUATE

## 2018-11-29 LAB — COMPREHENSIVE METABOLIC PANEL
ALT: 22 U/L (ref 0–44)
AST: 18 U/L (ref 15–41)
Albumin: 2.1 g/dL — ABNORMAL LOW (ref 3.5–5.0)
Alkaline Phosphatase: 104 U/L (ref 38–126)
Anion gap: 8 (ref 5–15)
BUN: 12 mg/dL (ref 8–23)
CO2: 28 mmol/L (ref 22–32)
Calcium: 7.6 mg/dL — ABNORMAL LOW (ref 8.9–10.3)
Chloride: 110 mmol/L (ref 98–111)
Creatinine, Ser: <0.3 mg/dL — ABNORMAL LOW (ref 0.61–1.24)
Glucose, Bld: 92 mg/dL (ref 70–99)
Potassium: 2.9 mmol/L — ABNORMAL LOW (ref 3.5–5.1)
Sodium: 146 mmol/L — ABNORMAL HIGH (ref 135–145)
Total Bilirubin: 0.3 mg/dL (ref 0.3–1.2)
Total Protein: 5.4 g/dL — ABNORMAL LOW (ref 6.5–8.1)

## 2018-11-29 LAB — CBC WITH DIFFERENTIAL/PLATELET
Abs Immature Granulocytes: 0.06 10*3/uL (ref 0.00–0.07)
Basophils Absolute: 0 10*3/uL (ref 0.0–0.1)
Basophils Relative: 0 %
Eosinophils Absolute: 0 10*3/uL (ref 0.0–0.5)
Eosinophils Relative: 0 %
HCT: 36.3 % — ABNORMAL LOW (ref 39.0–52.0)
Hemoglobin: 11.3 g/dL — ABNORMAL LOW (ref 13.0–17.0)
Immature Granulocytes: 1 %
Lymphocytes Relative: 28 %
Lymphs Abs: 2.2 10*3/uL (ref 0.7–4.0)
MCH: 28.5 pg (ref 26.0–34.0)
MCHC: 31.1 g/dL (ref 30.0–36.0)
MCV: 91.7 fL (ref 80.0–100.0)
Monocytes Absolute: 0.6 10*3/uL (ref 0.1–1.0)
Monocytes Relative: 8 %
Neutro Abs: 5.1 10*3/uL (ref 1.7–7.7)
Neutrophils Relative %: 63 %
Platelets: 293 10*3/uL (ref 150–400)
RBC: 3.96 MIL/uL — ABNORMAL LOW (ref 4.22–5.81)
RDW: 16.2 % — ABNORMAL HIGH (ref 11.5–15.5)
WBC: 8 10*3/uL (ref 4.0–10.5)
nRBC: 0 % (ref 0.0–0.2)

## 2018-11-29 LAB — POTASSIUM: Potassium: 3.7 mmol/L (ref 3.5–5.1)

## 2018-11-29 LAB — PROCALCITONIN: Procalcitonin: 0.2 ng/mL

## 2018-11-29 LAB — GLUCOSE, CAPILLARY
Glucose-Capillary: 81 mg/dL (ref 70–99)
Glucose-Capillary: 101 mg/dL — ABNORMAL HIGH (ref 70–99)
Glucose-Capillary: 145 mg/dL — ABNORMAL HIGH (ref 70–99)
Glucose-Capillary: 177 mg/dL — ABNORMAL HIGH (ref 70–99)
Glucose-Capillary: 164 mg/dL — ABNORMAL HIGH (ref 70–99)

## 2018-11-29 LAB — BLOOD GAS, ARTERIAL
Acid-Base Excess: 6.4 mmol/L — ABNORMAL HIGH (ref 0.0–2.0)
Bicarbonate: 29.2 mmol/L — ABNORMAL HIGH (ref 20.0–28.0)
FIO2: 0.4
O2 Saturation: 93.7 %
Patient temperature: 37
pCO2 arterial: 35 mmHg (ref 32.0–48.0)
pH, Arterial: 7.53 — ABNORMAL HIGH (ref 7.350–7.450)
pO2, Arterial: 61 mmHg — ABNORMAL LOW (ref 83.0–108.0)

## 2018-11-29 LAB — MAGNESIUM: Magnesium: 1.8 mg/dL (ref 1.7–2.4)

## 2018-11-29 LAB — PHOSPHORUS: Phosphorus: 1.8 mg/dL — ABNORMAL LOW (ref 2.5–4.6)

## 2018-11-29 MED ORDER — SODIUM CHLORIDE 0.9% FLUSH
3.0000 mL | Freq: Two times a day (BID) | INTRAVENOUS | Status: DC
  Administered 2018-11-29 – 2018-12-07 (×15): 3 mL via INTRAVENOUS

## 2018-11-29 MED ORDER — SODIUM CHLORIDE 0.9% FLUSH: 3.0000 mL | INTRAVENOUS | Status: DC | PRN

## 2018-11-29 MED ORDER — POTASSIUM CHLORIDE 10 MEQ/100ML IV SOLN
10.0000 meq | INTRAVENOUS | Status: DC
  Administered 2018-11-29: 10 meq via INTRAVENOUS
  Filled 2018-11-29 (×4): qty 100

## 2018-11-29 MED ORDER — K PHOS MONO-SOD PHOS DI & MONO 155-852-130 MG PO TABS
500.0000 mg | ORAL_TABLET | ORAL | Status: AC
  Administered 2018-11-29 (×4): 500 mg via ORAL
  Filled 2018-11-29 (×4): qty 2

## 2018-11-29 MED ORDER — SODIUM CHLORIDE 0.9 % IV SOLN
INTRAVENOUS | Status: DC | PRN
  Administered 2018-11-29: 10:00:00 500 mL via INTRAVENOUS
  Administered 2018-12-08: 250 mL via INTRAVENOUS

## 2018-11-29 MED ORDER — POTASSIUM CHLORIDE 10 MEQ/100ML IV SOLN
10.0000 meq | INTRAVENOUS | Status: AC
  Administered 2018-11-29 (×3): 10 meq via INTRAVENOUS
  Filled 2018-11-29: qty 100

## 2018-11-29 MED ORDER — LOSARTAN POTASSIUM 50 MG PO TABS
100.0000 mg | ORAL_TABLET | Freq: Every day | ORAL | Status: DC
  Administered 2018-11-29 – 2018-12-02 (×4): 100 mg via ORAL
  Filled 2018-11-29 (×5): qty 2

## 2018-11-29 NOTE — Progress Notes (Signed)
[facility] NAME: Maurice Patterson    MR#:  [id number]  DATE OF BIRTH:  1956/05/13  SUBJECTIVE:  CHIEF COMPLAINT:   Chief Complaint  Patient presents with  . Fever    unresponsive   -Patient not very responsive.  Desaturated overnight and is placed on high flow nasal cannula this morning. -covid 19 test is negative  REVIEW OF SYSTEMS:  Review of Systems  Unable to perform ROS: Patient nonverbal    DRUG ALLERGIES:   Allergies  Allergen Reactions  . Hydrocodone Anaphylaxis  . Morphine Other (See Comments)    Loss of memory  . Quetiapine Other (See Comments)    Insomnia, agitation, anxiety  . Ambien [Zolpidem] Other (See Comments)    delirium    . Brilinta [Ticagrelor] Other (See Comments)    Stroke   . Flexeril [Cyclobenzaprine] Other (See Comments)    delerium   . Flunitrazepam Other (See Comments)    ROHYPNOL (hallucinations)  . Haldol [Haloperidol Lactate] Other (See Comments)    delerium   . Levetiracetam Diarrhea and Other (See Comments)    Unable to walk  . Lorazepam Hives  . Risperdal [Risperidone] Other (See Comments)    Delirium   . Trazodone Other (See Comments)    Delirium Can take in low doses   . Benadryl [Diphenhydramine Hcl (Sleep)] Rash  . Penicillins Rash    Mouth ulcers Has patient had a PCN reaction causing immediate rash, facial/tongue/throat swelling, SOB or lightheadedness with hypotension: Yes Has patient had a PCN reaction causing severe rash involving mucus membranes or skin necrosis: No Has patient had a PCN reaction that required hospitalization No Has patient had a PCN reaction occurring within the last 10 years: Yes If all of the above answers are "NO", then may proceed with Cephalosporin use.  . Vancomycin Rash    Rash around IV site during vancomycin infusion    VITALS:  Blood pressure (!) 156/77, pulse 88, temperature 98.2 F (36.8 C), temperature source Oral, resp.  rate (!) 22, height 5\' 6"  (1.676 m), weight 57 kg, SpO2 98 %.  PHYSICAL EXAMINATION:  Physical Exam   GENERAL:  63 y.o.-year-old ill appearing patient lying in the bed with no acute distress.  EYES: Pupils equal, round, reactive to light and accommodation. No scleral icterus. Extraocular muscles intact.  HEENT: Head atraumatic, normocephalic. Oropharynx and nasopharynx clear.  NECK:  Supple, no jugular venous distention. No thyroid enlargement, no tenderness.  LUNGS: Moving air bilaterally, no wheezing, rales,rhonchi or crepitation. No use of accessory muscles of respiration. Decreased bibasilar breath sounds CARDIOVASCULAR: S1, S2 normal. No  rubs, or gallops. 2/6 systolic murmur present ABDOMEN: Soft, nontender, nondistended. Bowel sounds present. Has  PEG tube in place. No organomegaly or mass.  EXTREMITIES: No pedal edema, cyanosis, or clubbing.  NEUROLOGIC: Patient is lethargic, when alert, tracking, not following commands or speaking  PSYCHIATRIC: The patient is lethargic.  SKIN: No obvious rash, lesion, or ulcer.    LABORATORY PANEL:   CBC Recent Labs  Lab 11/29/18 0411  WBC 8.0  HGB 11.3*  HCT 36.3*  PLT 293   ------------------------------------------------------------------------------------------------------------------  Chemistries  Recent Labs  Lab 11/29/18 0411  NA 146*  K 2.9*  CL 110  CO2 28  GLUCOSE 92  BUN 12  CREATININE <0.30*  CALCIUM 7.6*  MG 1.8  AST 18  ALT 22  ALKPHOS 104  BILITOT 0.3   ------------------------------------------------------------------------------------------------------------------  Cardiac Enzymes Recent Labs  Lab 11/24/18 1314  TROPONINI <0.03   ------------------------------------------------------------------------------------------------------------------  RADIOLOGY:  Dg Chest Port 1 View  Result Date: 11/29/2018 CLINICAL DATA:  Aspiration pneumonia EXAM: PORTABLE CHEST 1 VIEW COMPARISON:  11/26/2018 FINDINGS:  Interval extubation.  NG tube also removed. Persistent low lung volumes with improvement in the right lower lobe mild bronchovascular airspace process. Residual basilar atelectasis noted. Trace pleural effusions remain. No superimposed edema pattern. Negative for pneumothorax. Trachea is midline. Aorta is atherosclerotic. Coronary stents noted. IMPRESSION: Improving right basilar bronchovascular airspace process compatible with resolving pneumonia/aspiration Residual basilar atelectasis and trace pleural effusions. Electronically Signed   By: Jerilynn Mages.  Shick M.D.   On: 11/29/2018 08:30    EKG:   Orders placed or performed during the hospital encounter of 11/24/18  . ED EKG 12-Lead  . ED EKG 12-Lead  . EKG 12-Lead  . EKG 12-Lead    ASSESSMENT AND PLAN:   63 year old male with past medical history significant for rapidly progressive dementia, nonverbal at baseline, history of stroke, CAD, CKD, CHF, recent admission to [facility] for intraparenchymal left cerebral hemorrhage comes from nursing home secondary to sepsis secondary to pneumonia  1.  Acute hypoxic respiratory failure on admission-secondary to pneumonia, concern for aspiration. -Patient was intubated on admission, extubated on 11/26/2018 and was currently on 3 L oxygen  COVID-19 negative. -Appreciate ID input.  on cefepime -Desaturated overnight again, high flow nasal cannula started.  Concern for aspiration again. -Chest x-ray ordered.  Will benefit from going to stepdown.  2.  Nonverbal and rapidly progressive dementia at baseline-seems to be at baseline, alert and tracking. -Continue Aricept and Namenda  3.  Sepsis-secondary to pneumonia -discontinued stress dose steroids - on antibiotics -Off pressors  4.  Diabetes mellitus-on Lantus and sliding scale insulin  5.  Concern for PEG tube occlusion- fixed, tube feeds restarted  6.  DVT prophylaxis-on Lovenox  9.  Hypokalemia- replaced  Overall poor prognosis with recurrent long  hospitalizations.  Palliative care consult is appreciated Patient remains a full code, plan for discharge home from hospital  Updating wife over the phone every day   All the records are reviewed and case discussed with Care Management/Social Workerr. Management plans discussed with the patient, family and they are in agreement.  CODE STATUS: Full Code  TOTAL TIME TAKING CARE OF THIS PATIENT: 38 minutes.   POSSIBLE D/C IN ? DAYS, DEPENDING ON CLINICAL CONDITION.   Gladstone Lighter M.D on 11/29/2018 at 12:58 PM  Between 7am to 6pm - Pager - [phone]  After 6pm go to [email or web address] - password [email or web address] [facility]  Office  [phone]  CC: Primary care physician; [name], MD

## 2018-11-29 NOTE — Progress Notes (Signed)
Very difficult to do oral care.  As soon as I enter his mouth with a swab he clamps his jaw shut and moves his head away.

## 2018-11-29 NOTE — Consult Note (Signed)
PHARMACY CONSULT NOTE - FOLLOW UP  Pharmacy Consult for Electrolyte Monitoring and Replacement   Recent Labs: Potassium (mmol/L)  Date Value  11/29/2018 2.9 (L)  12/17/2014 3.7   Magnesium (mg/dL)  Date Value  11/29/2018 1.8  12/17/2014 1.8   Calcium (mg/dL)  Date Value  11/29/2018 7.6 (L)   Calcium, Total (mg/dL)  Date Value  12/17/2014 7.9 (L)   Albumin (g/dL)  Date Value  11/29/2018 2.1 (L)  10/20/2016 4.5  12/17/2014 3.2 (L)   Phosphorus (mg/dL)  Date Value  11/29/2018 1.8 (L)   Sodium (mmol/L)  Date Value  11/29/2018 146 (H)  10/20/2016 146 (H)  12/17/2014 138   Assessment: S/p potassium and phosphorus replenishment, those electrolytes remain subpar. Additionally, he is currently on LR/43mEq, 20 mEq via PEG tube QD, and Dr. Tressia Miners ordered KCL IV 10 mEq x 4 for today. Will not need additional potassium replacement provided by pharmacy at this time, but will need to reassess potassium level today. However, his phosphorus level is significantly low and needs replenishment.   Magnesium is WNL and he does not need magnesium replacement at the moment.  Plan:  Continue current potassium regimen of LR/40 mEq IV continuous infusion,  20 mEq via PEG tube Q24 hours, and KCL IV 10 mEq x 4. Will recheck labs at 1800 to see if additional potassium replacement is needed.   Will order 500 mg K Phos every 4 hours x 4. Will recheck with AM labs.    Rowland Lathe, PharmD 11/29/2018 8:05 AM

## 2018-11-29 NOTE — Progress Notes (Addendum)
Palliative:  HPI: 63 y.o. male  with past medical history of dementia, CAD, diastolic CHF, CKD stage 3, diabetes, GERD, pancreatic cancer, seizure disorder, stroke, recurrent aspiration events with recurrent intubations admitted on 11/24/2018 with decreased responsiveness and fever requiring intubation in ED. Successfully extubated 11/26/18. Ongoing concern for aspiration pneumonia RLL with chronic feeding tube. Current concern for feeding tube clog but this has resolved. Continues with high risk of aspiration. Also with stage 3 sacral ulcer per RN. He has been transitioned out of ICU and continues with high aspiration risk with desaturation 4/8 requiring HFNC 30L. Overall prognosis is extremely poor.    I called and spoke with wife, Cecille Rubin. She is very upset as she was told that he was to be intubated this morning ~4 am and was never given an update. She was upset that she was even asked for permission for him to intubated and says that this is on his record that this is okay. I updated to the best of my ability that he failed BiPAP and was placed on HFNC currently requiring 30L (previously on 4L). There are no plans for intubation at this time. We discussed aspiration event and even aspiration pneumonitis as potential etiology of decline overnight. She continues to question why this keeps happening and feels that this should be preventable. She had to get off the phone for another phone call.   She also begins to tell me that she needs to see him. She has not seen her husband in over 5 weeks. I wonder if she were to see him and his decline and suffering if this would alter her GOC. I would be in favor of an approved visit if this will assist her to make good decisions for her husband. I also feel that he is high risk for acute respiratory decline and further re-intubation would be futile care and may not be offered by PCCM (I did not discuss this with family). I will discuss with administration to see if this may  be approved.   Exam: Minimally responsive. Limited exam performed s/t COVID-19 precautions (assessed from doorway). No respiratory distress. On HFNC.  Plan: - Consider visitation for wife so she can assess his critical illness and better make decisions for him. However, I do not feel that she will change her GOC. The only chance for change in [place] is if we can optimize him in order to return home under her care and then she may consider hospice and comfort if he were to continue to decline at home. She will not entertain any other possibilities at this time except to keep him alive with the goal to eventually get him home.    ADDENDUM: I returned a call to [place]. I spoke further about trying to allow her a visit. I also discussed my concern that the medical team is worried that if he declines further that re-intubation would cause him more harm than benefit and only add to his suffering at EOL. She is very clear that she feels this is her decision and she wants everything done to keep him alive. Her goal is to keep him alive to return home. She is holding out hope that if she can get him home and surrounded by family that he will improve (she is able to acknowledge that this may not happen but does not want to give up this hope). Because of this hope she wishes to have home health but then plans to transition to hospice care at home.  She becomes very tearful and frustrated. She blames multiple previous providers and events for his declining condition and needs someone to blame. Therapeutic listening and emotional support provided.   23 min  Vinie Sill, NP Palliative Medicine Team Pager # [phone] (M-F 8a-5p) Team Phone # [phone] (Nights/Weekends)

## 2018-11-29 NOTE — Progress Notes (Signed)
RT called to patient bedside by RN. Per MD, patient placed on Bipap and ABG obtained. Due to patients anatomical structure, was not able to maintain proper seal on BIPAP. MD advised to place patient on Heated HFNC with repeat ABg in 30 min.

## 2018-11-29 NOTE — Consult Note (Signed)
PHARMACY CONSULT NOTE - FOLLOW UP  Pharmacy Consult for Electrolyte Monitoring and Replacement   Recent Labs: Potassium (mmol/L)  Date Value  11/29/2018 3.7  12/17/2014 3.7   Magnesium (mg/dL)  Date Value  11/29/2018 1.8  12/17/2014 1.8   Calcium (mg/dL)  Date Value  11/29/2018 7.6 (L)   Calcium, Total (mg/dL)  Date Value  12/17/2014 7.9 (L)   Albumin (g/dL)  Date Value  11/29/2018 2.1 (L)  10/20/2016 4.5  12/17/2014 3.2 (L)   Phosphorus (mg/dL)  Date Value  11/29/2018 1.8 (L)   Sodium (mmol/L)  Date Value  11/29/2018 146 (H)  10/20/2016 146 (H)  12/17/2014 138   Assessment: K = 3.7 and pt is currently on LR/61mEq, with potassium and phosphorous both replenished today  Magnesium is WNL and he does not need magnesium replacement at the moment.  Plan:  Continue current potassium regimen of LR/40 mEq IV continuous infusion  Will recheck K and Phos with AM labs and reassess.   Lu Duffel, PharmD, BCPS Clinical Pharmacist 11/29/2018 6:33 PM '

## 2018-11-29 NOTE — Progress Notes (Signed)
Transferred pt to a rotation air mattress.  Rotation set at every 20 minutes.

## 2018-11-29 NOTE — TOC Progression Note (Signed)
Transition of Care [facility]) - Progression Note    Patient Details  Name: OMARRION CARMER MRN: [id number] Date of Birth: [birth year]  [facility] CM/SW Contact  Elza Rafter, RN Phone Number: 11/29/2018, 3:21 PM  Clinical Narrative:   Cecille Rubin, patients wife called and spoke with this case manager.  She states she will be starting a leave of absence from work on April 18th and can take him home then.  Patient was placed on HF O2 last night.  Per Dr. Tressia Miners he is not medically ready for DC at this point.  Referral has been made to [facility] with [facility] for DME.  Cecille Rubin is very tearful on phone.  States she wants to come visit him but cannot with the current restrictions.  She states [place] with palliative is emailing someone to see if she would be allowed to visit as patient is declining.  She states she still wants to bring him home with home health and if he doesn't improve she would consider transitioning to hospice.  Will add SW to [facility] services.      Expected Discharge Plan: [place] Barriers to Discharge: Continued Medical Work up  Expected Discharge Plan and Services Expected Discharge Plan: [place] In-house Referral: [facility] Discharge Planning Services: CM Consult Post Acute Care Choice: Home Health, Durable Medical Equipment Living arrangements for the past 2 months: Single Family Home                 DME Arranged: Oxygen DME Agency: [facility] HH Arranged: RN, PT, OT, Nurse's Aide [place] Agency: [place] [facility]   Social Determinants of Health (SDOH) Interventions    Readmission Risk Interventions No flowsheet data found.

## 2018-11-29 NOTE — Progress Notes (Signed)
Maurice Patterson  Maurice Patterson desats into the mid 70s. Maurice Patterson was placed on non-rebreather to bring his O2sat above 85. Dr. Jannifer Franklin was notified of Maurice Patterson status, ABG and BiPAP was ordered . Per respiratory Maurice Patterson was placed on high flow [place] after consultation with Dr. Jannifer Franklin. Maurice Patterson remained on high flow [place] and was able to maintain O@ sat in the mid 90s. Will continue to monitor.

## 2018-11-30 ENCOUNTER — Other Ambulatory Visit: Payer: Self-pay

## 2018-11-30 LAB — GLUCOSE, CAPILLARY
Glucose-Capillary: 118 mg/dL — ABNORMAL HIGH (ref 70–99)
Glucose-Capillary: 177 mg/dL — ABNORMAL HIGH (ref 70–99)
Glucose-Capillary: 183 mg/dL — ABNORMAL HIGH (ref 70–99)
Glucose-Capillary: 193 mg/dL — ABNORMAL HIGH (ref 70–99)
Glucose-Capillary: 195 mg/dL — ABNORMAL HIGH (ref 70–99)
Glucose-Capillary: 209 mg/dL — ABNORMAL HIGH (ref 70–99)

## 2018-11-30 LAB — PHOSPHORUS: Phosphorus: 3.2 mg/dL (ref 2.5–4.6)

## 2018-11-30 LAB — POTASSIUM: Potassium: 3.6 mmol/L (ref 3.5–5.1)

## 2018-11-30 LAB — PROCALCITONIN: Procalcitonin: <0.1 ng/mL

## 2018-11-30 MED ORDER — SODIUM CHLORIDE 0.9 % IV SOLN
3.0000 g | Freq: Four times a day (QID) | INTRAVENOUS | Status: DC
  Administered 2018-11-30 – 2018-12-02 (×8): 3 g via INTRAVENOUS
  Filled 2018-11-30 (×14): qty 3

## 2018-11-30 NOTE — Consult Note (Addendum)
Pharmacy Antibiotic Note  Maurice Patterson is a 63 y.o. male admitted on 11/24/2018 with Aspiration Pneumonia.  Pharmacy has been consulted for Unasyn dosing. Patient was on day 7 of Cefepime; it has since been discontinued and switched to Unasyn. Patient admitted from [facility].   Plan: Start Unasyn 3 g every 6 hours.    Height: 5\' 6"  (167.6 cm) Weight: 125 lb 9.6 oz (57 kg) IBW/kg (Calculated) : 63.8  Temp (24hrs), Avg:97.9 F (36.6 C), Min:97.5 F (36.4 C), Max:98.2 F (36.8 C)  Recent Labs  Lab 11/24/18 1314 11/24/18 1700 11/25/18 0440 11/26/18 0352 11/27/18 0514 11/28/18 0338 11/29/18 0411  WBC 12.8*  --  16.4* 10.7* 6.8 6.6 8.0  CREATININE 0.60*  --  0.55* 0.47* 0.39* <0.30* <0.30*  LATICACIDVEN 1.4 2.4*  --   --   --   --   --     CrCl cannot be calculated (This lab value cannot be used to calculate CrCl because it is not a number: <0.30).    Allergies  Allergen Reactions  . Hydrocodone Anaphylaxis  . Morphine Other (See Comments)    Loss of memory  . Quetiapine Other (See Comments)    Insomnia, agitation, anxiety  . Ambien [Zolpidem] Other (See Comments)    delirium    . Brilinta [Ticagrelor] Other (See Comments)    Stroke   . Flexeril [Cyclobenzaprine] Other (See Comments)    delerium   . Flunitrazepam Other (See Comments)    ROHYPNOL (hallucinations)  . Haldol [Haloperidol Lactate] Other (See Comments)    delerium   . Levetiracetam Diarrhea and Other (See Comments)    Unable to walk  . Lorazepam Hives  . Risperdal [Risperidone] Other (See Comments)    Delirium   . Trazodone Other (See Comments)    Delirium Can take in low doses   . Benadryl [Diphenhydramine Hcl (Sleep)] Rash  . Penicillins Rash    Mouth ulcers Has patient had a PCN reaction causing immediate rash, facial/tongue/throat swelling, SOB or lightheadedness with hypotension: Yes Has patient had a PCN reaction causing severe rash involving mucus membranes or skin necrosis: No Has patient  had a PCN reaction that required hospitalization No Has patient had a PCN reaction occurring within the last 10 years: Yes If all of the above answers are "NO", then may proceed with Cephalosporin use.  . Vancomycin Rash    Rash around IV site during vancomycin infusion    Antimicrobials this admission: Azithromycin 4/3 >> 4/6 Clindamycin 4/3 >> 4/5 Cefepime 4/3 >> 4/9 Doxycycline 4/5 >> 4/6  Unasyn 4/9 >>  Dose adjustments this admission: N/A  Microbiology results: 4/3 BCx: no growth x 5 days  4/4 MRSA PCR: negative  4/3 COVID: negative   Thank you for allowing pharmacy to be a part of this patient's care.  Rowland Lathe, PharmD 11/30/2018 8:13 AM

## 2018-11-30 NOTE — Progress Notes (Signed)
Palliative:  I met again at Maurice Patterson bedside along with wife, Maurice Patterson. Maurice Patterson is appreciative of being allowed to visit with her husband. We utilized this time to plan for his ultimate return home. Her main goal is for him to return home but is working to get the home situation set up for him. She is working closely with [employer], Anderson [place]. Maurice Patterson is hopeful that when he returns home and is surrounded by family and under her care that he will improve. We know this will improve his QOL. Maurice Patterson says she wants him home "even if it is only for one day."  With this in mind we discussed the hope he will do well at home but also a plan if he declines. She is open at this time to outpatient palliative care and even hospice care but wants to try home health and therapies "for at least a week." She seems inclined to keep him at home under hospice care if he declines further. She accepted a signed outpatient DNR form to utilize upon his return home but wishes to continue full code inpatient because her goal is to keep him alive to at least return home (even if it is for a short period of time). I also gave her Hard Choices booklet. She has been receptive to conversation and [place] for me this past week. She is frustrated and emotionally exhausted and I feel if we can work with her to get him home this will help the entire family enjoy whatever time they have together.   All questions/concerns addressed. Emotional support provided. RN to show her basic care of feeding tube and [employer] to further discuss and then they will see her out.   Exam: Alert. He does attempt to speak and he smiles and lights up when he hears his grandchildren and children's voices over the phone. He does verbalize but difficult to understand and is inconsistent. He does track.   Plan: - Home with home health and outpatient palliative care (ultimate transition to hospice at home).  - Wife given outpatient DNR to use at home but she desires full code while  hospitalized.   52 min   Vinie Sill, NP Palliative Medicine Team Pager # [phone] (M-F 8a-5p) Team Phone # [phone] (Nights/Weekends)

## 2018-11-30 NOTE — Care Management (Signed)
Patient suffers from rapidly progressive dementia, generalized weakness which impairs their ability to perform daily activities like walking, standing in the home. A walker will not resolve  issue with performing activities of daily living. A wheelchair will allow patient to safely perform daily activities. Patient is not able to propel themselves in the home using a standard weight wheelchair due to inability to follow commands and weakness. Patient can self propel in the lightweight wheelchair.  Accessories: elevating leg rests (ELRs), wheel locks, extensions and anti-tippers.

## 2018-11-30 NOTE — Progress Notes (Signed)
New referral for outpatient Palliative to follow at home received from [facility]. Patient will be followed by [facility] home health. Patient information faxed to referral. Thank you. Flo Shanks BSN, RN, [facility] [employer] [phone]

## 2018-11-30 NOTE — Care Management (Signed)
Patient has severe dementia with high risk for for aspiration which requires his head to be positioned in ways not feasible by a normal bed.  Head of bed must be elevated at least at a 45 degree angle to prevent aspiration.

## 2018-11-30 NOTE — Progress Notes (Addendum)
Palliative:  I have just spoken again with wife and have received approval for her to visit planned today 11/30/18 ~1330-1400. I will meet with her at this time. She expresses concern regarding learning to administer tube feeding and caring for feeding tube as well as having appropriate supplies for his sacral wound.   Vinie Sill, NP Palliative Medicine Team Pager # [phone] (M-F 8a-5p) Team Phone # [phone] (Nights/Weekends)

## 2018-11-30 NOTE — Telephone Encounter (Signed)
This encounter was created in error - please disregard.

## 2018-11-30 NOTE — Consult Note (Signed)
PHARMACY CONSULT NOTE - FOLLOW UP  Pharmacy Consult for Electrolyte Monitoring and Replacement   Recent Labs: Potassium (mmol/L)  Date Value  11/30/2018 3.6  12/17/2014 3.7   Magnesium (mg/dL)  Date Value  11/29/2018 1.8  12/17/2014 1.8   Calcium (mg/dL)  Date Value  11/29/2018 7.6 (L)   Calcium, Total (mg/dL)  Date Value  12/17/2014 7.9 (L)   Albumin (g/dL)  Date Value  11/29/2018 2.1 (L)  10/20/2016 4.5  12/17/2014 3.2 (L)   Phosphorus (mg/dL)  Date Value  11/30/2018 3.2   Sodium (mmol/L)  Date Value  11/29/2018 146 (H)  10/20/2016 146 (H)  12/17/2014 138   Assessment: Potassium is WNL at 3.6 and pt is currently on LR/80mEq and KCL PO 85mEq Q24 hours. No replenishment is needed by pharmacy today.   Phosphorous is WNL at 3.2 s/p replenishment.   Magnesium is WNL and he does not need magnesium replacement at the moment.  Plan:  Continue current potassium regimen of LR/40 mEq IV continuous infusion and KCL PO 20 mEq Q24 hours.  Will recheck K and Phos with AM labs.   Rowland Lathe, PharmD Clinical Pharmacist 11/30/2018 8:16 AM '

## 2018-11-30 NOTE — Progress Notes (Signed)
[place] at [place] NAME: Maurice Patterson    MR#:  [id number]  DATE OF BIRTH:  [birth year]  SUBJECTIVE:  CHIEF COMPLAINT:   Chief Complaint  Patient presents with  . Fever    unresponsive   - no significant changes, alert. On 3 l o2 via nasal cannula now Off high flow  REVIEW OF SYSTEMS:  Review of Systems  Unable to perform ROS: Patient nonverbal    DRUG ALLERGIES:   Allergies  Allergen Reactions  . Hydrocodone Anaphylaxis  . Morphine Other (See Comments)    Loss of memory  . Quetiapine Other (See Comments)    Insomnia, agitation, anxiety  . Ambien [Zolpidem] Other (See Comments)    delirium    . Brilinta [Ticagrelor] Other (See Comments)    Stroke   . Flexeril [Cyclobenzaprine] Other (See Comments)    delerium   . Flunitrazepam Other (See Comments)    ROHYPNOL (hallucinations)  . Haldol [Haloperidol Lactate] Other (See Comments)    delerium   . Levetiracetam Diarrhea and Other (See Comments)    Unable to walk  . Lorazepam Hives  . Risperdal [Risperidone] Other (See Comments)    Delirium   . Trazodone Other (See Comments)    Delirium Can take in low doses   . Benadryl [Diphenhydramine Hcl (Sleep)] Rash  . Penicillins Rash    Mouth ulcers Has patient had a PCN reaction causing immediate rash, facial/tongue/throat swelling, SOB or lightheadedness with hypotension: Yes Has patient had a PCN reaction causing severe rash involving mucus membranes or skin necrosis: No Has patient had a PCN reaction that required hospitalization No Has patient had a PCN reaction occurring within the last 10 years: Yes If all of the above answers are "NO", then may proceed with Cephalosporin use.  . Vancomycin Rash    Rash around IV site during vancomycin infusion    VITALS:  Blood pressure (!) 147/90, pulse 96, temperature 98.5 F (36.9 C), temperature source Oral, resp. rate (!) 22, height 5\' 6"  (1.676 m), weight 57 kg, SpO2 95  %.  PHYSICAL EXAMINATION:  Physical Exam   GENERAL:  63 y.o.-year-old ill appearing patient lying in the bed with no acute distress.  EYES: Pupils equal, round, reactive to light and accommodation. No scleral icterus. Extraocular muscles intact.  HEENT: Head atraumatic, normocephalic. Oropharynx and nasopharynx clear.  NECK:  Supple, no jugular venous distention. No thyroid enlargement, no tenderness.  LUNGS: Moving air bilaterally, no wheezing, rales,rhonchi or crepitation. No use of accessory muscles of respiration. Decreased bibasilar breath sounds CARDIOVASCULAR: S1, S2 normal. No  rubs, or gallops. 2/6 systolic murmur present ABDOMEN: Soft, nontender, nondistended. Bowel sounds present. Has  PEG tube in place. No organomegaly or mass.  EXTREMITIES: No pedal edema, cyanosis, or clubbing.  NEUROLOGIC: Patient is lethargic, when alert, tracking, not following commands or speaking  PSYCHIATRIC: The patient is lethargic.  SKIN: No obvious rash, lesion, or ulcer.    LABORATORY PANEL:   CBC Recent Labs  Lab 11/29/18 0411  WBC 8.0  HGB 11.3*  HCT 36.3*  PLT 293   ------------------------------------------------------------------------------------------------------------------  Chemistries  Recent Labs  Lab 11/29/18 0411  11/30/18 0427  NA 146*  --   --   K 2.9*   < > 3.6  CL 110  --   --   CO2 28  --   --   GLUCOSE 92  --   --   BUN 12  --   --  CREATININE <0.30*  --   --   CALCIUM 7.6*  --   --   MG 1.8  --   --   AST 18  --   --   ALT 22  --   --   ALKPHOS 104  --   --   BILITOT 0.3  --   --    < > = values in this interval not displayed.   ------------------------------------------------------------------------------------------------------------------  Cardiac Enzymes Recent Labs  Lab 11/24/18 1314  TROPONINI <0.03   ------------------------------------------------------------------------------------------------------------------  RADIOLOGY:  Dg Chest  Port 1 View  Result Date: 11/29/2018 CLINICAL DATA:  Aspiration pneumonia EXAM: PORTABLE CHEST 1 VIEW COMPARISON:  11/26/2018 FINDINGS: Interval extubation.  NG tube also removed. Persistent low lung volumes with improvement in the right lower lobe mild bronchovascular airspace process. Residual basilar atelectasis noted. Trace pleural effusions remain. No superimposed edema pattern. Negative for pneumothorax. Trachea is midline. Aorta is atherosclerotic. Coronary stents noted. IMPRESSION: Improving right basilar bronchovascular airspace process compatible with resolving pneumonia/aspiration Residual basilar atelectasis and trace pleural effusions. Electronically Signed   By: Jerilynn Mages.  Shick M.D.   On: 11/29/2018 08:30    EKG:   Orders placed or performed during the hospital encounter of 11/24/18  . ED EKG 12-Lead  . ED EKG 12-Lead  . EKG 12-Lead  . EKG 12-Lead    ASSESSMENT AND PLAN:   63 year old male with past medical history significant for rapidly progressive dementia, nonverbal at baseline, history of stroke, CAD, CKD, CHF, recent admission to [facility] for intraparenchymal left cerebral hemorrhage comes from nursing home secondary to sepsis secondary to pneumonia  1.  Acute hypoxic respiratory failure on admission-secondary to pneumonia, concern for aspiration. -Patient was intubated on admission, extubated on 11/26/2018 and was  on 3 L oxygen then transitioned to high flow after a possible aspiration event on the floor  COVID-19 negative. -Appreciate ID input.  on cefepime- change to unasyn - -Chest x-ray confirming pneumonia- possible aspiration.  - however weaned back to 3L o2 today  2.  Nonverbal and rapidly progressive dementia at baseline-seems to be at baseline, alert and tracking. -Continue Aricept and Namenda  3.  Sepsis-secondary to pneumonia -discontinued stress dose steroids - on antibiotics -Off pressors  4.  Diabetes mellitus-on Lantus and sliding scale insulin  5.  Concern  for PEG tube occlusion- fixed, tube feeds restarted  6.  DVT prophylaxis-on Lovenox  9.  Hypokalemia- replaced  Overall poor prognosis with recurrent long hospitalizations.  Palliative care consult is appreciated Patient remains a full code, plan for discharge home from hospital  Updating wife over the phone every day   All the records are reviewed and case discussed with Care Management/Social Workerr. Management plans discussed with the patient, family and they are in agreement.  CODE STATUS: Full Code  TOTAL TIME TAKING CARE OF THIS PATIENT: 32 minutes.   POSSIBLE D/C IN ? DAYS, DEPENDING ON CLINICAL CONDITION.   Gladstone Lighter M.D on 11/30/2018 at 11:04 AM  Between 7am to 6pm - Pager - [phone]  After 6pm go to [email or web address] - password [email or web address] [place] [facility]  Office  [phone]  CC: Primary care physician; [name], MD

## 2018-11-30 NOTE — Addendum Note (Signed)
Addended by: Darvin Neighbours on: 11/30/2018 09:41 AM   Modules accepted: Level of Service, SmartSet

## 2018-12-01 LAB — GLUCOSE, CAPILLARY
Glucose-Capillary: 126 mg/dL — ABNORMAL HIGH (ref 70–99)
Glucose-Capillary: 158 mg/dL — ABNORMAL HIGH (ref 70–99)
Glucose-Capillary: 147 mg/dL — ABNORMAL HIGH (ref 70–99)
Glucose-Capillary: 172 mg/dL — ABNORMAL HIGH (ref 70–99)
Glucose-Capillary: 184 mg/dL — ABNORMAL HIGH (ref 70–99)

## 2018-12-01 LAB — PROCALCITONIN: Procalcitonin: <0.1 ng/mL

## 2018-12-01 LAB — CREATININE, SERUM
Creatinine, Ser: 0.3 mg/dL — ABNORMAL LOW (ref 0.61–1.24)
GFR calc Af Amer: >60 mL/min (ref >60)
GFR calc non Af Amer: >60 mL/min (ref >60)

## 2018-12-01 LAB — PHOSPHORUS: Phosphorus: 2.6 mg/dL (ref 2.5–4.6)

## 2018-12-01 LAB — POTASSIUM: Potassium: 3.3 mmol/L — ABNORMAL LOW (ref 3.5–5.1)

## 2018-12-01 MED ORDER — POTASSIUM CHLORIDE 20 MEQ PO PACK
20.0000 meq | PACK | Freq: Once | ORAL | Status: AC
  Administered 2018-12-01: 20 meq
  Filled 2018-12-01: qty 1

## 2018-12-01 NOTE — Consult Note (Signed)
PHARMACY CONSULT NOTE - FOLLOW UP  Pharmacy Consult for Electrolyte Monitoring and Replacement   Recent Labs: Potassium (mmol/L)  Date Value  12/01/2018 3.3 (L)  12/17/2014 3.7   Magnesium (mg/dL)  Date Value  11/29/2018 1.8  12/17/2014 1.8   Calcium (mg/dL)  Date Value  11/29/2018 7.6 (L)   Calcium, Total (mg/dL)  Date Value  12/17/2014 7.9 (L)   Albumin (g/dL)  Date Value  11/29/2018 2.1 (L)  10/20/2016 4.5  12/17/2014 3.2 (L)   Phosphorus (mg/dL)  Date Value  12/01/2018 2.6   Sodium (mmol/L)  Date Value  11/29/2018 146 (H)  10/20/2016 146 (H)  12/17/2014 138   Assessment: Potassium is 3.3  and pt is currently on LR/26mEq and KCL PO 9mEq Q24 hours. Will give and additional KCl 87mEq times one.  Phosphorous is WNL at 2.6.   Plan:  Continue current potassium regimen of LR/40 mEq IV continuous infusion and KCL PO 20 mEq Q24 hours.  Will recheck K, Mag, and Phos with AM labs.   Paulina Fusi, PharmD, BCPS 12/01/2018 1:13 PM

## 2018-12-01 NOTE — Progress Notes (Signed)
[place] at [place] NAME: Maurice Patterson    MR#:  [id number]  DATE OF BIRTH:  1956-03-29  SUBJECTIVE:  CHIEF COMPLAINT:   Chief Complaint  Patient presents with  . Fever    unresponsive   - no significant changes, alert. On 2-3 l o2 via nasal cannula now - G tube clogged this morning  REVIEW OF SYSTEMS:  Review of Systems  Unable to perform ROS: Patient nonverbal    DRUG ALLERGIES:   Allergies  Allergen Reactions  . Hydrocodone Anaphylaxis  . Morphine Other (See Comments)    Loss of memory  . Quetiapine Other (See Comments)    Insomnia, agitation, anxiety  . Ambien [Zolpidem] Other (See Comments)    delirium    . Brilinta [Ticagrelor] Other (See Comments)    Stroke   . Flexeril [Cyclobenzaprine] Other (See Comments)    delerium   . Flunitrazepam Other (See Comments)    ROHYPNOL (hallucinations)  . Haldol [Haloperidol Lactate] Other (See Comments)    delerium   . Levetiracetam Diarrhea and Other (See Comments)    Unable to walk  . Lorazepam Hives  . Risperdal [Risperidone] Other (See Comments)    Delirium   . Trazodone Other (See Comments)    Delirium Can take in low doses   . Benadryl [Diphenhydramine Hcl (Sleep)] Rash  . Penicillins Rash    Mouth ulcers Has patient had a PCN reaction causing immediate rash, facial/tongue/throat swelling, SOB or lightheadedness with hypotension: Yes Has patient had a PCN reaction causing severe rash involving mucus membranes or skin necrosis: No Has patient had a PCN reaction that required hospitalization No Has patient had a PCN reaction occurring within the last 10 years: Yes If all of the above answers are "NO", then may proceed with Cephalosporin use.  . Vancomycin Rash    Rash around IV site during vancomycin infusion    VITALS:  Blood pressure 127/89, pulse 93, temperature (!) 96 F (35.6 C), temperature source Axillary, resp. rate 16, height 5\' 6"  (1.676 m),  weight 57 kg, SpO2 97 %.  PHYSICAL EXAMINATION:  Physical Exam   GENERAL:  63 y.o.-year-old ill appearing patient lying in the bed with no acute distress.  EYES: Pupils equal, round, reactive to light and accommodation. No scleral icterus. Extraocular muscles intact.  HEENT: Head atraumatic, normocephalic. Oropharynx and nasopharynx clear.  NECK:  Supple, no jugular venous distention. No thyroid enlargement, no tenderness.  LUNGS: Moving air bilaterally, no wheezing, rales,rhonchi or crepitation. No use of accessory muscles of respiration. Decreased bibasilar breath sounds CARDIOVASCULAR: S1, S2 normal. No  rubs, or gallops. 2/6 systolic murmur present ABDOMEN: Soft, nontender, nondistended. Bowel sounds present. Has  PEG tube in place. No organomegaly or mass.  EXTREMITIES: No pedal edema, cyanosis, or clubbing.  NEUROLOGIC: Patient is lethargic, when alert, tracking, not following commands or speaking  PSYCHIATRIC: The patient is lethargic.  SKIN: No obvious rash, lesion, or ulcer.    LABORATORY PANEL:   CBC Recent Labs  Lab 11/29/18 0411  WBC 8.0  HGB 11.3*  HCT 36.3*  PLT 293   ------------------------------------------------------------------------------------------------------------------  Chemistries  Recent Labs  Lab 11/29/18 0411  12/01/18 0537  NA 146*  --   --   K 2.9*   < > 3.3*  CL 110  --   --   CO2 28  --   --   GLUCOSE 92  --   --   BUN 12  --   --  CREATININE <0.30*  --  0.30*  CALCIUM 7.6*  --   --   MG 1.8  --   --   AST 18  --   --   ALT 22  --   --   ALKPHOS 104  --   --   BILITOT 0.3  --   --    < > = values in this interval not displayed.   ------------------------------------------------------------------------------------------------------------------  Cardiac Enzymes Recent Labs  Lab 11/24/18 1314  TROPONINI <0.03   ------------------------------------------------------------------------------------------------------------------   RADIOLOGY:  No results found.  EKG:   Orders placed or performed during the hospital encounter of 11/24/18  . ED EKG 12-Lead  . ED EKG 12-Lead  . EKG 12-Lead  . EKG 12-Lead    ASSESSMENT AND PLAN:   63 year old male with past medical history significant for rapidly progressive dementia, nonverbal at baseline, history of stroke, CAD, CKD, CHF, recent admission to [facility] for intraparenchymal left cerebral hemorrhage comes from nursing home secondary to sepsis secondary to pneumonia  1.  Acute hypoxic respiratory failure on admission-secondary to pneumonia, concern for aspiration. -Patient was intubated on admission, extubated on 11/26/2018 and was  on 3 L oxygen then transitioned to high flow after a possible aspiration event on the floor  COVID-19 negative. -Appreciate ID input.  on cefepime- changed to unasyn - -Chest x-ray confirming pneumonia- possible aspiration.  - however weaned back to 2-3L o2 and off high flow oxygen  2.  Nonverbal and rapidly progressive dementia at baseline-seems to be at baseline, alert and tracking. -Continue Aricept and Namenda  3.  Sepsis-secondary to pneumonia -discontinued stress dose steroids - on antibiotics -Off pressors  4.  Diabetes mellitus-on Lantus and sliding scale insulin  5.  Concern for PEG tube clogging- monitor and RN to flush  6.  DVT prophylaxis-on Lovenox  9.  Hypokalemia- replaced  Overall poor prognosis with recurrent long hospitalizations.  Palliative care consult is appreciated Patient remains a full code unless inpatient. -Wife was able to visit her husband yesterday.  Out of facility DNR has been filled by palliative which will take effect once patient leaves the hospital.  Wife wants him to be full code while here.  Palliative will continue at discharge- plan is to discharge home once he is stable and when all home health services arranged. Likely in 2-3 days   Updating wife over the phone every day   All the  records are reviewed and case discussed with Care Management/Social Workerr. Management plans discussed with the patient, family and they are in agreement.  CODE STATUS: Full Code  TOTAL TIME TAKING CARE OF THIS PATIENT: 33 minutes.   POSSIBLE D/C IN ? DAYS, DEPENDING ON CLINICAL CONDITION.   Gladstone Lighter M.D on 12/01/2018 at 8:31 AM  Between 7am to 6pm - Pager - [phone]  After 6pm go to [email or web address] - password [email or web address] [place] [facility]  Office  [phone]  CC: Primary care physician; [name], MD

## 2018-12-01 NOTE — TOC Progression Note (Signed)
Transition of Care [facility]) - Progression Note    Patient Details  Name: Maurice Patterson MRN: [id number] Date of Birth: 1956/05/16  Transition of Care [facility]) CM/SW Contact  Elza Rafter, RN Phone Number: 12/01/2018, 10:50 AM  Clinical Narrative:   Cassie Freer, wife that patient was transferred to 106.  Please keep T.V. on at all times while awake.      Expected Discharge Plan: [place] Barriers to Discharge: Continued Medical Work up  Expected Discharge Plan and Services Expected Discharge Plan: [place] In-house Referral: [facility] Discharge Planning Services: CM Consult Post Acute Care Choice: Home Health, Durable Medical Equipment Living arrangements for the past 2 months: Single Family Home                 DME Arranged: Oxygen DME Agency: [facility] HH Arranged: RN, PT, OT, Nurse's Aide [place] Agency: [facility]   Social Determinants of Health (SDOH) Interventions    Readmission Risk Interventions Readmission Risk Prevention Plan 11/29/2018  Transportation Screening Complete  Medication Review Press photographer) Complete  PCP or Specialist appointment within 3-5 days of discharge Complete  HRI or [place] Complete  SW Recovery Care/Counseling Consult Complete  [place] Not Applicable  Some recent data might be hidden

## 2018-12-01 NOTE — Progress Notes (Signed)
ID 63 yr male with CVA, non verbal Has PEG I had seen the patient for a consult on 4/3 for questionableCOVID 19 He was diagnosed with aspiration pneumonia COVID 19 neg Has been on antibiotics since 4/3 CXR from 4/8 showed improving rt basilar airspace process Would recommend DC antibiotic as no fever, no WBC   ID will sign off

## 2018-12-01 NOTE — Consult Note (Signed)
Pharmacy Antibiotic Note  Maurice Patterson is a 63 y.o. male admitted on 11/24/2018 with Aspiration Pneumonia.  Pharmacy has been consulted for Unasyn dosing. Patient was on day 7 of Cefepime; it has since been discontinued and switched to Unasyn. Patient admitted from [facility].   Day 2 of Unasyn  Plan: Continue Unasyn 3 g every 6 hours.    Height: 5\' 6"  (167.6 cm) Weight: 125 lb 9.6 oz (57 kg) IBW/kg (Calculated) : 63.8  Temp (24hrs), Avg:96.8 F (36 C), Min:96 F (35.6 C), Max:97.6 F (36.4 C)  Recent Labs  Lab 11/24/18 1700  11/25/18 0440 11/26/18 0352 11/27/18 0514 11/28/18 0338 11/29/18 0411 12/01/18 0537  WBC  --   --  16.4* 10.7* 6.8 6.6 8.0  --   CREATININE  --    < > 0.55* 0.47* 0.39* <0.30* <0.30* 0.30*  LATICACIDVEN 2.4*  --   --   --   --   --   --   --    < > = values in this interval not displayed.    Estimated Creatinine Clearance: 78.2 mL/min (A) (by C-G formula based on SCr of 0.3 mg/dL (L)).    Allergies  Allergen Reactions  . Hydrocodone Anaphylaxis  . Morphine Other (See Comments)    Loss of memory  . Quetiapine Other (See Comments)    Insomnia, agitation, anxiety  . Ambien [Zolpidem] Other (See Comments)    delirium    . Brilinta [Ticagrelor] Other (See Comments)    Stroke   . Flexeril [Cyclobenzaprine] Other (See Comments)    delerium   . Flunitrazepam Other (See Comments)    ROHYPNOL (hallucinations)  . Haldol [Haloperidol Lactate] Other (See Comments)    delerium   . Levetiracetam Diarrhea and Other (See Comments)    Unable to walk  . Lorazepam Hives  . Risperdal [Risperidone] Other (See Comments)    Delirium   . Trazodone Other (See Comments)    Delirium Can take in low doses   . Benadryl [Diphenhydramine Hcl (Sleep)] Rash  . Penicillins Rash    Mouth ulcers Has patient had a PCN reaction causing immediate rash, facial/tongue/throat swelling, SOB or lightheadedness with hypotension: Yes Has patient had a PCN reaction causing  severe rash involving mucus membranes or skin necrosis: No Has patient had a PCN reaction that required hospitalization No Has patient had a PCN reaction occurring within the last 10 years: Yes If all of the above answers are "NO", then may proceed with Cephalosporin use.  . Vancomycin Rash    Rash around IV site during vancomycin infusion    Antimicrobials this admission: Azithromycin 4/3 >> 4/6 Clindamycin 4/3 >> 4/5 Cefepime 4/3 >> 4/9 Doxycycline 4/5 >> 4/6  Unasyn 4/9 >>  Dose adjustments this admission: N/A  Microbiology results: 4/3 BCx: no growth x 5 days  4/4 MRSA PCR: negative  4/3 COVID: negative   Thank you for allowing pharmacy to be a part of this patient's care.  Paulina Fusi, PharmD, BCPS 12/01/2018 1:24 PM

## 2018-12-01 NOTE — Progress Notes (Signed)
Called wife and left a voice mail.

## 2018-12-01 NOTE — Care Management Important Message (Signed)
Important Message  Patient Details  Name: Maurice Patterson MRN: [id number] Date of Birth: 10-04-1955   Medicare Important Message Given:  Yes    Juliann Pulse A Rosslyn Pasion 12/01/2018, 12:50 PM

## 2018-12-02 ENCOUNTER — Inpatient Hospital Stay: Payer: PPO

## 2018-12-02 DIAGNOSIS — R61 Generalized hyperhidrosis: Secondary | ICD-10-CM

## 2018-12-02 LAB — CBC WITH DIFFERENTIAL/PLATELET
Abs Immature Granulocytes: 0.1 10*3/uL — ABNORMAL HIGH (ref 0.00–0.07)
Basophils Absolute: 0 10*3/uL (ref 0.0–0.1)
Basophils Relative: 0 %
Eosinophils Absolute: 0.1 10*3/uL (ref 0.0–0.5)
Eosinophils Relative: 1 %
HCT: 42 % (ref 39.0–52.0)
Hemoglobin: 12.4 g/dL — ABNORMAL LOW (ref 13.0–17.0)
Immature Granulocytes: 1 %
Lymphocytes Relative: 24 %
Lymphs Abs: 2.8 10*3/uL (ref 0.7–4.0)
MCH: 28.1 pg (ref 26.0–34.0)
MCHC: 29.5 g/dL — ABNORMAL LOW (ref 30.0–36.0)
MCV: 95.2 fL (ref 80.0–100.0)
Monocytes Absolute: 0.8 10*3/uL (ref 0.1–1.0)
Monocytes Relative: 7 %
Neutro Abs: 7.6 10*3/uL (ref 1.7–7.7)
Neutrophils Relative %: 67 %
Platelets: 476 10*3/uL — ABNORMAL HIGH (ref 150–400)
RBC: 4.41 MIL/uL (ref 4.22–5.81)
RDW: 17 % — ABNORMAL HIGH (ref 11.5–15.5)
WBC: 11.4 10*3/uL — ABNORMAL HIGH (ref 4.0–10.5)
nRBC: 0 % (ref 0.0–0.2)

## 2018-12-02 LAB — GLUCOSE, CAPILLARY
Glucose-Capillary: 141 mg/dL — ABNORMAL HIGH (ref 70–99)
Glucose-Capillary: 143 mg/dL — ABNORMAL HIGH (ref 70–99)
Glucose-Capillary: 156 mg/dL — ABNORMAL HIGH (ref 70–99)
Glucose-Capillary: 184 mg/dL — ABNORMAL HIGH (ref 70–99)
Glucose-Capillary: 189 mg/dL — ABNORMAL HIGH (ref 70–99)
Glucose-Capillary: 198 mg/dL — ABNORMAL HIGH (ref 70–99)

## 2018-12-02 LAB — BASIC METABOLIC PANEL
Anion gap: 6 (ref 5–15)
BUN: 11 mg/dL (ref 8–23)
CO2: 29 mmol/L (ref 22–32)
Calcium: 7.7 mg/dL — ABNORMAL LOW (ref 8.9–10.3)
Chloride: 107 mmol/L (ref 98–111)
Creatinine, Ser: <0.3 mg/dL — ABNORMAL LOW (ref 0.61–1.24)
Glucose, Bld: 170 mg/dL — ABNORMAL HIGH (ref 70–99)
Potassium: 3.6 mmol/L (ref 3.5–5.1)
Sodium: 142 mmol/L (ref 135–145)

## 2018-12-02 LAB — PHOSPHORUS: Phosphorus: 2.4 mg/dL — ABNORMAL LOW (ref 2.5–4.6)

## 2018-12-02 LAB — LACTIC ACID, PLASMA
Lactic Acid, Venous: 2.2 mmol/L (ref 0.5–1.9)
Lactic Acid, Venous: 2.4 mmol/L (ref 0.5–1.9)
Lactic Acid, Venous: 1.9 mmol/L (ref 0.5–1.9)

## 2018-12-02 LAB — MAGNESIUM: Magnesium: 2.1 mg/dL (ref 1.7–2.4)

## 2018-12-02 MED ORDER — SODIUM CHLORIDE 0.9 % IV BOLUS
500.0000 mL | Freq: Once | INTRAVENOUS | Status: AC
  Administered 2018-12-02: 20:00:00 500 mL via INTRAVENOUS

## 2018-12-02 NOTE — Progress Notes (Signed)
This nurse was called to pt room by NT Katie. Upon entering the room the pt appeared extremely diaphoretic and was only responsive to pain. BS was 184 at last check. VS were Temp 97.6 axillary, BP 111/65, Pulse 85, O2 98% on 2L [place]. CN Meghan called to room. RR was called at that time. MD Salary notified. CBC, blood culture, lactic acid, CXR, and EKG were ordered. Will continue to monitor.

## 2018-12-02 NOTE — Progress Notes (Addendum)
[place] at [place] NAME: Bayne Fosnaugh    MR#:  [id number]  DATE OF BIRTH:  Nov 07, 1955  SUBJECTIVE:  CHIEF COMPLAINT:   Chief Complaint  Patient presents with  . Fever    unresponsive   - no significant changes, alert. On 2-3 l o2 via nasal cannula now - G tube clogged this morning  REVIEW OF SYSTEMS:  Review of Systems  Unable to perform ROS: Patient nonverbal    DRUG ALLERGIES:   Allergies  Allergen Reactions  . Hydrocodone Anaphylaxis  . Morphine Other (See Comments)    Loss of memory  . Quetiapine Other (See Comments)    Insomnia, agitation, anxiety  . Ambien [Zolpidem] Other (See Comments)    delirium    . Brilinta [Ticagrelor] Other (See Comments)    Stroke   . Flexeril [Cyclobenzaprine] Other (See Comments)    delerium   . Flunitrazepam Other (See Comments)    ROHYPNOL (hallucinations)  . Haldol [Haloperidol Lactate] Other (See Comments)    delerium   . Levetiracetam Diarrhea and Other (See Comments)    Unable to walk  . Lorazepam Hives  . Risperdal [Risperidone] Other (See Comments)    Delirium   . Trazodone Other (See Comments)    Delirium Can take in low doses   . Benadryl [Diphenhydramine Hcl (Sleep)] Rash  . Penicillins Rash    Mouth ulcers Has patient had a PCN reaction causing immediate rash, facial/tongue/throat swelling, SOB or lightheadedness with hypotension: Yes Has patient had a PCN reaction causing severe rash involving mucus membranes or skin necrosis: No Has patient had a PCN reaction that required hospitalization No Has patient had a PCN reaction occurring within the last 10 years: Yes If all of the above answers are "NO", then may proceed with Cephalosporin use.  . Vancomycin Rash    Rash around IV site during vancomycin infusion    VITALS:  Blood pressure 123/86, pulse 87, temperature (!) 97.3 F (36.3 C), temperature source Axillary, resp. rate 16, height 5\' 6"  (1.676 m),  weight 57 kg, SpO2 98 %.  PHYSICAL EXAMINATION:  Physical Exam   GENERAL:  63 y.o.-year-old ill appearing patient lying in the bed with no acute distress.  EYES: Pupils equal, round, reactive to light and accommodation. No scleral icterus. Extraocular muscles intact.  HEENT: Head atraumatic, normocephalic. Oropharynx and nasopharynx clear.  NECK:  Supple, no jugular venous distention. No thyroid enlargement, no tenderness.  LUNGS: Moving air bilaterally, no wheezing, rales,rhonchi or crepitation. No use of accessory muscles of respiration. Decreased bibasilar breath sounds CARDIOVASCULAR: S1, S2 normal. No  rubs, or gallops. 2/6 systolic murmur present ABDOMEN: Soft, nontender, nondistended. Bowel sounds present. Has  PEG tube in place. No organomegaly or mass.  EXTREMITIES: No pedal edema, cyanosis, or clubbing.  NEUROLOGIC: Patient is lethargic, when alert, tracking, not following commands or speaking  PSYCHIATRIC: The patient is lethargic.  SKIN: No obvious rash, lesion, or ulcer.    LABORATORY PANEL:   CBC Recent Labs  Lab 11/29/18 0411  WBC 8.0  HGB 11.3*  HCT 36.3*  PLT 293   ------------------------------------------------------------------------------------------------------------------  Chemistries  Recent Labs  Lab 11/29/18 0411  12/02/18 0535  NA 146*  --  142  K 2.9*   < > 3.6  CL 110  --  107  CO2 28  --  29  GLUCOSE 92  --  170*  BUN 12  --  11  CREATININE <0.30*   < > <  0.30*  CALCIUM 7.6*  --  7.7*  MG 1.8  --  2.1  AST 18  --   --   ALT 22  --   --   ALKPHOS 104  --   --   BILITOT 0.3  --   --    < > = values in this interval not displayed.   ------------------------------------------------------------------------------------------------------------------  Cardiac Enzymes No results for input(s): TROPONINI in the last 168 hours. ------------------------------------------------------------------------------------------------------------------   RADIOLOGY:  No results found.  EKG:   Orders placed or performed during the hospital encounter of 11/24/18  . ED EKG 12-Lead  . ED EKG 12-Lead  . EKG 12-Lead  . EKG 12-Lead    ASSESSMENT AND PLAN:  63 year old male with past medical history significant for rapidly progressive dementia, nonverbal at baseline, history of stroke, CAD, CKD, CHF, recent admission to [facility] for intraparenchymal left cerebral hemorrhage comes from nursing home secondary to sepsis secondary to pneumonia  *Acute hypoxic respiratory failure on admission-secondary to pneumonia, concern for aspiration. -Patient was intubated on admission, extubated on 11/26/2018 and was  on 3 L oxygen then transitioned to high flow after a possible aspiration event on the floor  COVID-19 negative. -Appreciate ID input.  on cefepime- changed to unasyn - -Chest x-ray confirming pneumonia- possible aspiration.  - however weaned back to 2-3L o2 and off high flow oxygen  *Nonverbal and rapidly progressive dementia at baseline-seems to be at baseline, alert and tracking. -Continue Aricept and Namenda  *Sepsis-secondary to pneumonia -discontinued stress dose steroids - on antibiotics -Off pressors  *Diabetes mellitus-on Lantus and sliding scale insulin  *Concern for PEG tube clogging- monitor and RN to flush  *DVT prophylaxis-on Lovenox  *Hypokalemia- replaced  Overall poor prognosis with recurrent long hospitalizations.  Palliative care consult is appreciated Patient remains a full code unless inpatient. Wife was able to visit her husband, did discuss case with all questions answered, still awaiting for equipment to be delivered for home use, wife still requests full CODE STATUS even when patient comes home in 1-2 days  All the records are reviewed and case discussed with Care Management/Social Workerr. Management plans discussed with the patient, family and they are in agreement.  CODE STATUS: Full Code  TOTAL TIME TAKING  CARE OF THIS PATIENT: 30 minutes.   POSSIBLE D/C IN 1-2 DAYS, DEPENDING ON CLINICAL CONDITION.   Avel Peace Kelani Robart M.D on 12/02/2018 at 12:03 PM  Between 7am to 6pm - Pager - [phone]  After 6pm go to [email or web address] - password [email or web address] [facility]  Office  [phone]  CC: Primary care physician; [name], MD

## 2018-12-02 NOTE — Progress Notes (Signed)
Rapid response called due to patient being lethargic and extremely diaphoretic.  Discussed with Dr. Jerelyn Charles  On arrival patient is drowsy.  Confused.  Looks cachectic  BS was 184 at last check. VS were Temp 97.6 axillary, BP 111/65, Pulse 85, O2 98% on 2L [place].   S1, S2 heard Good air entry bilaterally Scaphoid abdomen with PEG tube in place.  Nontender. No lower extremity edema  Chest x-ray reviewed and shows bibasilar atelectasis  EKG shows no ST elevation  *Aspiration pneumonia patient is on Unasyn.  Lactic acid noted to be 2.2.  500 mL bolus.  At this time plan is for patient to be discharged home with hospice services following.  Continues to be full code.  DNR status at home his wife wants to see him and be aggressive with care till he arrives at home.  This has been noted.  Total critical care time evaluating patient, coordinating care was 35 minutes

## 2018-12-02 NOTE — Progress Notes (Signed)
   12/02/18 1700  Clinical Encounter Type  Visited With Patient;Health care provider  Visit Type Initial  Referral From Nurse  Consult/Referral To Chaplain  Chaplain received page for RR and arrived at patient room. Chaplain made pastoral presence known and nurse said everything was ok.

## 2018-12-03 LAB — GLUCOSE, CAPILLARY
Glucose-Capillary: 112 mg/dL — ABNORMAL HIGH (ref 70–99)
Glucose-Capillary: 129 mg/dL — ABNORMAL HIGH (ref 70–99)
Glucose-Capillary: 155 mg/dL — ABNORMAL HIGH (ref 70–99)
Glucose-Capillary: 163 mg/dL — ABNORMAL HIGH (ref 70–99)
Glucose-Capillary: 168 mg/dL — ABNORMAL HIGH (ref 70–99)
Glucose-Capillary: 195 mg/dL — ABNORMAL HIGH (ref 70–99)

## 2018-12-03 MED ORDER — LOSARTAN POTASSIUM 50 MG PO TABS: 100.0000 mg | ORAL_TABLET | Freq: Every day | ORAL | Status: DC

## 2018-12-03 MED ORDER — LOSARTAN POTASSIUM 50 MG PO TABS
100.0000 mg | ORAL_TABLET | Freq: Every day | ORAL | Status: DC
  Administered 2018-12-03 – 2018-12-08 (×5): 100 mg
  Filled 2018-12-03 (×4): qty 2

## 2018-12-03 NOTE — Progress Notes (Signed)
Sacral dressing changed without difficulty, pt tolerated well.  [id number]

## 2018-12-03 NOTE — Progress Notes (Addendum)
[place] at [place] NAME: Llewellyn Choplin    MR#:  [id number]  DATE OF BIRTH:  [birth year]  SUBJECTIVE:  CHIEF COMPLAINT:   Chief Complaint  Patient presents with  . Fever    unresponsive   Events of the evening noted, patient is nonverbal at baseline, no significant change from yesterday, discussed with the patient's wife-still awaiting home equipment to be placed REVIEW OF SYSTEMS:  Review of Systems  Unable to perform ROS: Patient nonverbal    DRUG ALLERGIES:   Allergies  Allergen Reactions  . Hydrocodone Anaphylaxis  . Morphine Other (See Comments)    Loss of memory  . Quetiapine Other (See Comments)    Insomnia, agitation, anxiety  . Ambien [Zolpidem] Other (See Comments)    delirium    . Brilinta [Ticagrelor] Other (See Comments)    Stroke   . Flexeril [Cyclobenzaprine] Other (See Comments)    delerium   . Flunitrazepam Other (See Comments)    ROHYPNOL (hallucinations)  . Haldol [Haloperidol Lactate] Other (See Comments)    delerium   . Levetiracetam Diarrhea and Other (See Comments)    Unable to walk  . Lorazepam Hives  . Risperdal [Risperidone] Other (See Comments)    Delirium   . Trazodone Other (See Comments)    Delirium Can take in low doses   . Benadryl [Diphenhydramine Hcl (Sleep)] Rash  . Penicillins Rash    Mouth ulcers Has patient had a PCN reaction causing immediate rash, facial/tongue/throat swelling, SOB or lightheadedness with hypotension: Yes Has patient had a PCN reaction causing severe rash involving mucus membranes or skin necrosis: No Has patient had a PCN reaction that required hospitalization No Has patient had a PCN reaction occurring within the last 10 years: Yes If all of the above answers are "NO", then may proceed with Cephalosporin use.  . Vancomycin Rash    Rash around IV site during vancomycin infusion    VITALS:  Blood pressure (!) 154/83, pulse 86, temperature 97.9 F  (36.6 C), temperature source Oral, resp. rate 17, height 5\' 6"  (1.676 m), weight 57 kg, SpO2 100 %.  PHYSICAL EXAMINATION:  Physical Exam   GENERAL:  63 y.o.-year-old ill appearing patient lying in the bed with no acute distress.  EYES: Pupils equal, round, reactive to light and accommodation. No scleral icterus. Extraocular muscles intact.  HEENT: Head atraumatic, normocephalic. Oropharynx and nasopharynx clear.  NECK:  Supple, no jugular venous distention. No thyroid enlargement, no tenderness.  LUNGS: Moving air bilaterally, no wheezing, rales,rhonchi or crepitation. No use of accessory muscles of respiration. Decreased bibasilar breath sounds CARDIOVASCULAR: S1, S2 normal. No  rubs, or gallops. 2/6 systolic murmur present ABDOMEN: Soft, nontender, nondistended. Bowel sounds present. Has  PEG tube in place. No organomegaly or mass.  EXTREMITIES: No pedal edema, cyanosis, or clubbing.  NEUROLOGIC: Patient is lethargic, when alert, tracking, not following commands or speaking  PSYCHIATRIC: The patient is lethargic.  SKIN: No obvious rash, lesion, or ulcer.    LABORATORY PANEL:   CBC Recent Labs  Lab 12/02/18 1814  WBC 11.4*  HGB 12.4*  HCT 42.0  PLT 476*   ------------------------------------------------------------------------------------------------------------------  Chemistries  Recent Labs  Lab 11/29/18 0411  12/02/18 0535  NA 146*  --  142  K 2.9*   < > 3.6  CL 110  --  107  CO2 28  --  29  GLUCOSE 92  --  170*  BUN 12  --  11  CREATININE <0.30*   < > <0.30*  CALCIUM 7.6*  --  7.7*  MG 1.8  --  2.1  AST 18  --   --   ALT 22  --   --   ALKPHOS 104  --   --   BILITOT 0.3  --   --    < > = values in this interval not displayed.   ------------------------------------------------------------------------------------------------------------------  Cardiac Enzymes No results for input(s): TROPONINI in the last 168 hours.  ------------------------------------------------------------------------------------------------------------------  RADIOLOGY:  Dg Chest Port 1 View  Result Date: 12/02/2018 CLINICAL DATA:  Sepsis. EXAM: PORTABLE CHEST 1 VIEW COMPARISON:  Radiograph of November 29, 2018. FINDINGS: The heart size and mediastinal contours are within normal limits. No pneumothorax or pleural effusion is noted. Minimal bibasilar subsegmental atelectasis is noted. The visualized skeletal structures are unremarkable. IMPRESSION: Minimal bibasilar subsegmental atelectasis. Electronically Signed   By: Marijo Conception, M.D.   On: 12/02/2018 17:57    EKG:   Orders placed or performed during the hospital encounter of 11/24/18  . ED EKG 12-Lead  . ED EKG 12-Lead  . EKG 12-Lead  . EKG 12-Lead  . EKG 12-Lead  . EKG 12-Lead    ASSESSMENT AND PLAN:  63 year old male with past medical history significant for rapidly progressive dementia, nonverbal at baseline, history of stroke, CAD, CKD, CHF, recent admission to [facility] for intraparenchymal left cerebral hemorrhage comes from nursing home secondary to sepsis secondary to pneumonia  *Acute hypoxic respiratory failure on admission-secondary to pneumonia, concern for aspiration. -Patient was intubated on admission, extubated on 11/26/2018 and was  on 3 L oxygen then transitioned to high flow after a possible aspiration event on the floor  COVID-19 negative. -Appreciate ID input.  on cefepime- changed to unasyn - -Chest x-ray confirming pneumonia- possible aspiration.  - however weaned back to 2-3L o2 and off high flow oxygen  *Nonverbal and rapidly progressive dementia at baseline-seems to be at baseline, alert and tracking. -Continue Aricept and Namenda  *Sepsis-secondary to pneumonia -discontinued stress dose steroids - on antibiotics -Off pressors  *Diabetes mellitus-on Lantus and sliding scale insulin  *Concern for PEG tube clogging- monitor and RN to flush  *DVT  prophylaxis-on Lovenox  *Hypokalemia- replaced  Overall poor prognosis with recurrent long hospitalizations.  Palliative care consult is appreciated Patient remains a full code unless inpatient. Wife was able to visit her husband, did discuss case with all questions answered, still awaiting for equipment to be delivered for home use, wife still requests full  All the records are reviewed and case discussed with Care Management/Social Workerr. Management plans discussed with the patient, family and they are in agreement.  CODE STATUS: Full Code  TOTAL TIME TAKING CARE OF THIS PATIENT: 30 minutes.   POSSIBLE D/C IN 1-2 DAYS, DEPENDING ON CLINICAL CONDITION.   Avel Peace Salary M.D on 12/03/2018 at 12:03 PM  Between 7am to 6pm - Pager - [phone]  After 6pm go to [email or web address] - password [email or web address] [facility]  Office  [phone]  CC: Primary care physician; [name], MD

## 2018-12-04 LAB — GLUCOSE, CAPILLARY
Glucose-Capillary: 136 mg/dL — ABNORMAL HIGH (ref 70–99)
Glucose-Capillary: 137 mg/dL — ABNORMAL HIGH (ref 70–99)
Glucose-Capillary: 152 mg/dL — ABNORMAL HIGH (ref 70–99)
Glucose-Capillary: 152 mg/dL — ABNORMAL HIGH (ref 70–99)
Glucose-Capillary: 153 mg/dL — ABNORMAL HIGH (ref 70–99)
Glucose-Capillary: 156 mg/dL — ABNORMAL HIGH (ref 70–99)
Glucose-Capillary: 99 mg/dL (ref 70–99)

## 2018-12-04 MED ORDER — INSULIN GLARGINE 100 UNIT/ML ~~LOC~~ SOLN
5.0000 [IU] | Freq: Once | SUBCUTANEOUS | Status: AC
  Administered 2018-12-04: 23:00:00 5 [IU] via SUBCUTANEOUS
  Filled 2018-12-04: qty 0.05

## 2018-12-04 MED ORDER — METOPROLOL TARTRATE 5 MG/5ML IV SOLN: 2.5000 mg | Freq: Four times a day (QID) | INTRAVENOUS | Status: DC | PRN

## 2018-12-04 NOTE — Progress Notes (Addendum)
J tube is clogged. MDSudini notified. Instructed to hold tube feedings until the morning.

## 2018-12-04 NOTE — Care Management Important Message (Signed)
Important Message  Patient Details  Name: TRYGG MANTZ MRN: [id number] Date of Birth: [birth year]   Medicare Important Message Given:  Yes    Dannette Barbara 12/04/2018, 11:01 AM

## 2018-12-04 NOTE — Progress Notes (Signed)
[place] at [place] NAME: Maurice Patterson    MR#:  [id number]  DATE OF BIRTH:  Jan 21, 1956  SUBJECTIVE:  CHIEF COMPLAINT:   Chief Complaint  Patient presents with  . Fever    unresponsive   No events overnight per nursing staff, patient nonverbal at baseline, discussed with the patient's wife-she wants the patient to come home on Thursday REVIEW OF SYSTEMS:  Review of Systems  Unable to perform ROS: Patient nonverbal    DRUG ALLERGIES:   Allergies  Allergen Reactions  . Hydrocodone Anaphylaxis  . Morphine Other (See Comments)    Loss of memory  . Quetiapine Other (See Comments)    Insomnia, agitation, anxiety  . Ambien [Zolpidem] Other (See Comments)    delirium    . Brilinta [Ticagrelor] Other (See Comments)    Stroke   . Flexeril [Cyclobenzaprine] Other (See Comments)    delerium   . Flunitrazepam Other (See Comments)    ROHYPNOL (hallucinations)  . Haldol [Haloperidol Lactate] Other (See Comments)    delerium   . Levetiracetam Diarrhea and Other (See Comments)    Unable to walk  . Lorazepam Hives  . Risperdal [Risperidone] Other (See Comments)    Delirium   . Trazodone Other (See Comments)    Delirium Can take in low doses   . Benadryl [Diphenhydramine Hcl (Sleep)] Rash  . Penicillins Rash    Mouth ulcers Has patient had a PCN reaction causing immediate rash, facial/tongue/throat swelling, SOB or lightheadedness with hypotension: Yes Has patient had a PCN reaction causing severe rash involving mucus membranes or skin necrosis: No Has patient had a PCN reaction that required hospitalization No Has patient had a PCN reaction occurring within the last 10 years: Yes If all of the above answers are "NO", then may proceed with Cephalosporin use.  . Vancomycin Rash    Rash around IV site during vancomycin infusion    VITALS:  Blood pressure 127/68, pulse 94, temperature 97.6 F (36.4 C), temperature source Oral,  resp. rate 18, height 5\' 6"  (1.676 m), weight 64.9 kg, SpO2 100 %.  PHYSICAL EXAMINATION:  Physical Exam   GENERAL:  63 y.o.-year-old ill appearing patient lying in the bed with no acute distress.  EYES: Pupils equal, round, reactive to light and accommodation. No scleral icterus. Extraocular muscles intact.  HEENT: Head atraumatic, normocephalic. Oropharynx and nasopharynx clear.  NECK:  Supple, no jugular venous distention. No thyroid enlargement, no tenderness.  LUNGS: Moving air bilaterally, no wheezing, rales,rhonchi or crepitation. No use of accessory muscles of respiration. Decreased bibasilar breath sounds CARDIOVASCULAR: S1, S2 normal. No  rubs, or gallops. 2/6 systolic murmur present ABDOMEN: Soft, nontender, nondistended. Bowel sounds present. Has  PEG tube in place. No organomegaly or mass.  EXTREMITIES: No pedal edema, cyanosis, or clubbing.  NEUROLOGIC: Patient is lethargic, when alert, tracking, not following commands or speaking  PSYCHIATRIC: The patient is lethargic.  SKIN: No obvious rash, lesion, or ulcer.    LABORATORY PANEL:   CBC Recent Labs  Lab 12/02/18 1814  WBC 11.4*  HGB 12.4*  HCT 42.0  PLT 476*   ------------------------------------------------------------------------------------------------------------------  Chemistries  Recent Labs  Lab 11/29/18 0411  12/02/18 0535  NA 146*  --  142  K 2.9*   < > 3.6  CL 110  --  107  CO2 28  --  29  GLUCOSE 92  --  170*  BUN 12  --  11  CREATININE <0.30*   < > <  0.30*  CALCIUM 7.6*  --  7.7*  MG 1.8  --  2.1  AST 18  --   --   ALT 22  --   --   ALKPHOS 104  --   --   BILITOT 0.3  --   --    < > = values in this interval not displayed.   ------------------------------------------------------------------------------------------------------------------  Cardiac Enzymes No results for input(s): TROPONINI in the last 168 hours.  ------------------------------------------------------------------------------------------------------------------  RADIOLOGY:  Dg Chest Port 1 View  Result Date: 12/02/2018 CLINICAL DATA:  Sepsis. EXAM: PORTABLE CHEST 1 VIEW COMPARISON:  Radiograph of November 29, 2018. FINDINGS: The heart size and mediastinal contours are within normal limits. No pneumothorax or pleural effusion is noted. Minimal bibasilar subsegmental atelectasis is noted. The visualized skeletal structures are unremarkable. IMPRESSION: Minimal bibasilar subsegmental atelectasis. Electronically Signed   By: Marijo Conception, M.D.   On: 12/02/2018 17:57    EKG:   Orders placed or performed during the hospital encounter of 11/24/18  . ED EKG 12-Lead  . ED EKG 12-Lead  . EKG 12-Lead  . EKG 12-Lead  . EKG 12-Lead  . EKG 12-Lead    ASSESSMENT AND PLAN:  63 year old male with past medical history significant for rapidly progressive dementia, nonverbal at baseline, history of stroke, CAD, CKD, CHF, recent admission to [facility] for intraparenchymal left cerebral hemorrhage comes from nursing home secondary to sepsis secondary to pneumonia  *Acute hypoxic respiratory failure on admission-secondary to pneumonia, concern for aspiration. -Patient was intubated on admission, extubated on 11/26/2018 and was  on 3 L oxygen then transitioned to high flow after a possible aspiration event on the floor  COVID-19 negative. Infectious disease did see patient while in house-treated with course of cefepime/Unasyn  Chest x-ray confirming pneumonia- possible aspiration.  weaned back to 2-3L o2 and off high flow oxygen  *Nonverbal and rapidly progressive dementia at baseline at baseline, alert and tracking. Continue Aricept and Namenda  *Sepsis secondary to pneumonia Resolved Treated on our sepsis protocol, weaned off pressor support, treated with course of antibiotics  *Diabetes mellitus Stable on current regiment   *Concern for PEG tube  clogging Resolved Tolerating tube feeds  *Hypokalemia replaced  Overall poor prognosis with recurrent long hospitalizations.  Palliative care consulted/following, pt remains a full code, wife wants patient to come home on Thursday-unable to be discharged until then per her wishes, patient is ready for discharge today, in discussion with case management/social work-the patient's wife has been refusing to accept equipment at home for patient, will discharge patient to home once the wife feels she is ready  All the records are reviewed and case discussed with Care Management/Social Workerr. Management plans discussed with the patient, family and they are in agreement.  CODE STATUS: Full Code  TOTAL TIME TAKING CARE OF THIS PATIENT: 35 minutes.   POSSIBLE D/C IN 0-10 DAYS, DEPENDING ON PATIENT'S WIFE.   Avel Peace  M.D on 12/04/2018 at 10:10 AM  Between 7am to 6pm - Pager - [phone]  After 6pm go to [email or web address] - password [email or web address] [facility]  Office  [phone]  CC: Primary care physician; [name], MD

## 2018-12-04 NOTE — Progress Notes (Signed)
Nutrition Follow-up  RD working remotely.  DOCUMENTATION CODES:   Severe malnutrition in context of acute illness/injury  INTERVENTION:  Continue Glucerna 1.5 Cal at 50 mL/hr. Provides 1800 kcal, 99 grams of protein, 912 mL H2O daily.  With free water flush of 75 mL Q4hrs patient will receive a total of 1362 mL H2O daily including water in tube feeds. Patient will also receive water from medication administration.  NUTRITION DIAGNOSIS:   Severe Malnutrition related to chronic illness as evidenced by moderate fat depletion, moderate muscle depletion, percent weight loss.  Ongoing - addressing with TF regimen.  GOAL:   Provide needs based on ASPEN/SCCM guidelines  Met with TF regimen.  MONITOR:   Vent status, Labs, Weight trends, I & O's, Skin, TF tolerance  REASON FOR ASSESSMENT:   Consult Assessment of nutrition requirement/status  ASSESSMENT:   63 y.o. male with a history of CVA, non verbal. dementia, s/p PEG, NH resident, CAD, CHF, CKD admitted from SNF with fever, cough and hypoxia  Discussed with RN. Patient tolerating goal TF regimen of Glucerna 1.5 Cal at 50 mL/hr well. Per chart plan is for discharge home with wife possibly on 4/16.  Enteral Access: G-tube present on admission  Medications reviewed and include: cholecalciferol 1000 units daily, free water flush 75 mL Q4hrs, Novolog 0-9 units Q4hrs, Lantus 10 units daily, Protonix 40 mg daily per tube, potassium chloride 20 mEq daily per tube, vitamin C 250 mg BID per tube.  Labs reviewed: CBG 99-156.  I/O: 850 mL UOP yesterday (0.5 mL/kg/hr)  Diet Order:   Diet Order            Diet NPO time specified  Diet effective now             EDUCATION NEEDS:   Not appropriate for education at this time  Skin:  Skin Assessment: Skin Integrity Issues:(Stg II buttocks; DTI coccyx)  Last BM:  12/03/2018 - large type 5  Height:   Ht Readings from Last 1 Encounters:  11/27/18 _0  (1.676 m)   Weight:    Wt Readings from Last 1 Encounters:  12/04/18 64.9 kg   Ideal Body Weight:  64.5 kg  BMI:  Body mass index is 23.08 kg/m.  Estimated Nutritional Needs:   Kcal:  1700-1900  Protein:  80-95 grams  Fluid:  1.5-1.7 L/day  Willey Blade, MS, RD, LDN Office: [phone] Pager: [phone] After Hours/Weekend Pager: [phone]

## 2018-12-04 NOTE — Plan of Care (Signed)
  Problem: Clinical Measurements: Goal: Ability to maintain clinical measurements within normal limits will improve Outcome: Progressing Goal: Will remain free from infection Outcome: Progressing Goal: Diagnostic test results will improve Outcome: Progressing Goal: Respiratory complications will improve Outcome: Progressing Goal: Cardiovascular complication will be avoided Outcome: Progressing   Problem: Nutrition: Goal: Adequate nutrition will be maintained Outcome: Progressing Note:  Tol tube feedings   Problem: Elimination: Goal: Will not experience complications related to bowel motility Outcome: Progressing Goal: Will not experience complications related to urinary retention Outcome: Progressing   Problem: Pain Managment: Goal: General experience of comfort will improve Outcome: Progressing   Problem: Safety: Goal: Ability to remain free from injury will improve Outcome: Progressing

## 2018-12-05 ENCOUNTER — Ambulatory Visit: Payer: PPO | Admitting: Urology

## 2018-12-05 ENCOUNTER — Inpatient Hospital Stay: Payer: PPO

## 2018-12-05 LAB — GLUCOSE, CAPILLARY
Glucose-Capillary: 113 mg/dL — ABNORMAL HIGH (ref 70–99)
Glucose-Capillary: 121 mg/dL — ABNORMAL HIGH (ref 70–99)
Glucose-Capillary: 144 mg/dL — ABNORMAL HIGH (ref 70–99)
Glucose-Capillary: 145 mg/dL — ABNORMAL HIGH (ref 70–99)
Glucose-Capillary: 163 mg/dL — ABNORMAL HIGH (ref 70–99)
Glucose-Capillary: 207 mg/dL — ABNORMAL HIGH (ref 70–99)
Glucose-Capillary: 212 mg/dL — ABNORMAL HIGH (ref 70–99)

## 2018-12-05 MED ORDER — IOPAMIDOL (ISOVUE-300) INJECTION 61%
10.0000 mL | Freq: Once | INTRAVENOUS | Status: AC | PRN
  Administered 2018-12-05: 15:00:00 10 mL

## 2018-12-05 MED ORDER — DEXTROSE-NACL 5-0.45 % IV SOLN
INTRAVENOUS | Status: DC
  Administered 2018-12-05 (×2): via INTRAVENOUS

## 2018-12-05 NOTE — Plan of Care (Signed)
  Problem: Education: Goal: Knowledge of General Education information will improve Description Including pain rating scale, medication(s)/side effects and non-pharmacologic comfort measures 12/05/2018 1416 by Rowe Robert, RN Outcome: Progressing 12/05/2018 1415 by Rowe Robert, RN Outcome: Progressing   Problem: Health Behavior/Discharge Planning: Goal: Ability to manage health-related needs will improve 12/05/2018 1416 by Rowe Robert, RN Outcome: Progressing 12/05/2018 1415 by Rowe Robert, RN Outcome: Progressing   Problem: Clinical Measurements: Goal: Ability to maintain clinical measurements within normal limits will improve 12/05/2018 1416 by Rowe Robert, RN Outcome: Progressing 12/05/2018 1415 by Rowe Robert, RN Outcome: Progressing Goal: Will remain free from infection 12/05/2018 1416 by Rowe Robert, RN Outcome: Progressing 12/05/2018 1415 by Rowe Robert, RN Outcome: Progressing Goal: Diagnostic test results will improve 12/05/2018 1416 by Rowe Robert, RN Outcome: Progressing 12/05/2018 1415 by Rowe Robert, RN Outcome: Progressing Goal: Respiratory complications will improve 12/05/2018 1416 by Rowe Robert, RN Outcome: Progressing 12/05/2018 1415 by Rowe Robert, RN Outcome: Progressing Goal: Cardiovascular complication will be avoided 12/05/2018 1416 by Rowe Robert, RN Outcome: Progressing 12/05/2018 1415 by Rowe Robert, RN Outcome: Progressing   Problem: Nutrition: Goal: Adequate nutrition will be maintained 12/05/2018 1416 by Rowe Robert, RN Outcome: Progressing 12/05/2018 1415 by Rowe Robert, RN Outcome: Progressing   Problem: Elimination: Goal: Will not experience complications related to bowel motility 12/05/2018 1416 by Rowe Robert, RN Outcome: Progressing 12/05/2018 1415 by Rowe Robert, RN Outcome: Progressing Goal: Will not experience complications related to urinary retention 12/05/2018 1416 by Rowe Robert,  RN Outcome: Progressing 12/05/2018 1415 by Rowe Robert, RN Outcome: Progressing   Problem: Pain Managment: Goal: General experience of comfort will improve 12/05/2018 1416 by Rowe Robert, RN Outcome: Progressing 12/05/2018 1415 by Rowe Robert, RN Outcome: Progressing   Problem: Safety: Goal: Ability to remain free from injury will improve 12/05/2018 1416 by Rowe Robert, RN Outcome: Progressing 12/05/2018 1415 by Rowe Robert, RN Outcome: Progressing   Problem: Skin Integrity: Goal: Risk for impaired skin integrity will decrease 12/05/2018 1416 by Rowe Robert, RN Outcome: Progressing 12/05/2018 1415 by Rowe Robert, RN Outcome: Progressing

## 2018-12-05 NOTE — Progress Notes (Addendum)
[place] [facility] [place] NAME: Maurice Patterson    MR#:  [id number]  DATE OF BIRTH:  01/25/1956  SUBJECTIVE:  CHIEF COMPLAINT:   Chief Complaint  Patient presents with  . Fever    unresponsive  PEG tube clogged overnight-we will ask interventional radiology for replacement versus declogging later today, patient nonverbal at baseline, discussed with the patient's wife-she wants the patient to come home on Thursday REVIEW OF SYSTEMS:  Review of Systems  Unable to perform ROS: Patient nonverbal    DRUG ALLERGIES:   Allergies  Allergen Reactions  . Hydrocodone Anaphylaxis  . Morphine Other (See Comments)    Loss of memory  . Quetiapine Other (See Comments)    Insomnia, agitation, anxiety  . Ambien [Zolpidem] Other (See Comments)    delirium    . Brilinta [Ticagrelor] Other (See Comments)    Stroke   . Flexeril [Cyclobenzaprine] Other (See Comments)    delerium   . Flunitrazepam Other (See Comments)    ROHYPNOL (hallucinations)  . Haldol [Haloperidol Lactate] Other (See Comments)    delerium   . Levetiracetam Diarrhea and Other (See Comments)    Unable to walk  . Lorazepam Hives  . Risperdal [Risperidone] Other (See Comments)    Delirium   . Trazodone Other (See Comments)    Delirium Can take in low doses   . Benadryl [Diphenhydramine Hcl (Sleep)] Rash  . Penicillins Rash    Mouth ulcers Has patient had a PCN reaction causing immediate rash, facial/tongue/throat swelling, SOB or lightheadedness with hypotension: Yes Has patient had a PCN reaction causing severe rash involving mucus membranes or skin necrosis: No Has patient had a PCN reaction that required hospitalization No Has patient had a PCN reaction occurring within the last 10 years: Yes If all of the above answers are "NO", then may proceed with Cephalosporin use.  . Vancomycin Rash    Rash around IV site during vancomycin infusion    VITALS:  Blood pressure (!)  91/59, pulse (!) 106, temperature (!) 97.5 F (36.4 C), temperature source Axillary, resp. rate 20, height 5\' 6"  (1.676 m), weight 64.8 kg, SpO2 94 %.  PHYSICAL EXAMINATION:  Physical Exam   GENERAL:  63 y.o.-year-old ill appearing patient lying in the bed with no acute distress.  EYES: Pupils equal, round, reactive to light and accommodation. No scleral icterus. Extraocular muscles intact.  HEENT: Head atraumatic, normocephalic. Oropharynx and nasopharynx clear.  NECK:  Supple, no jugular venous distention. No thyroid enlargement, no tenderness.  LUNGS: Moving air bilaterally, no wheezing, rales,rhonchi or crepitation. No use of accessory muscles of respiration. Decreased bibasilar breath sounds CARDIOVASCULAR: S1, S2 normal. No  rubs, or gallops. 2/6 systolic murmur present ABDOMEN: Soft, nontender, nondistended. Bowel sounds present. Has  PEG tube in place. No organomegaly or mass.  EXTREMITIES: No pedal edema, cyanosis, or clubbing.  NEUROLOGIC: Patient is lethargic, when alert, tracking, not following commands or speaking  PSYCHIATRIC: The patient is lethargic.  SKIN: No obvious rash, lesion, or ulcer.    LABORATORY PANEL:   CBC Recent Labs  Lab 12/02/18 1814  WBC 11.4*  HGB 12.4*  HCT 42.0  PLT 476*   ------------------------------------------------------------------------------------------------------------------  Chemistries  Recent Labs  Lab 11/29/18 0411  12/02/18 0535  NA 146*  --  142  K 2.9*   < > 3.6  CL 110  --  107  CO2 28  --  29  GLUCOSE 92  --  170*  BUN 12  --  11  CREATININE <0.30*   < > <0.30*  CALCIUM 7.6*  --  7.7*  MG 1.8  --  2.1  AST 18  --   --   ALT 22  --   --   ALKPHOS 104  --   --   BILITOT 0.3  --   --    < > = values in this interval not displayed.   ------------------------------------------------------------------------------------------------------------------  Cardiac Enzymes No results for input(s): TROPONINI in the last  168 hours. ------------------------------------------------------------------------------------------------------------------  RADIOLOGY:  No results found.  EKG:   Orders placed or performed during the hospital encounter of 11/24/18  . ED EKG 12-Lead  . ED EKG 12-Lead  . EKG 12-Lead  . EKG 12-Lead  . EKG 12-Lead  . EKG 12-Lead    ASSESSMENT AND PLAN:  63 year old male with past medical history significant for rapidly progressive dementia, nonverbal at baseline, history of stroke, CAD, CKD, CHF, recent admission to [facility] for intraparenchymal left cerebral hemorrhage comes from nursing home secondary to sepsis secondary to pneumonia  *Acute hypoxic respiratory failure on admission-secondary to pneumonia, concern for aspiration. -Patient was intubated on admission, extubated on 11/26/2018 and was  on 3 L oxygen then transitioned to high flow after a possible aspiration event on the floor  COVID-19 negative. Infectious disease did see patient while in house-treated with course of cefepime/Unasyn  Chest x-ray confirming pneumonia- possible aspiration.  weaned back to 2-3L o2 and off high flow oxygen  *Acute recurrent PEG tube dysfunction  IR consulted for replacement versus declogging   *Nonverbal and rapidly progressive dementia at baseline at baseline, alert and tracking. Continue Aricept and Namenda  *Sepsis secondary to pneumonia Resolved Treated on our sepsis protocol, weaned off pressor support, treated with course of antibiotics  *Diabetes mellitus Stable on current regiment   *Hypokalemia replaced  Overall poor prognosis with recurrent long hospitalizations.  Palliative care consulted/following, pt remains a full code, wife wants patient to come home on Thursday-unable to be discharged until then per her wishes, patient is ready for discharge today, in discussion with case management/social work-the patient's wife has been refusing to accept equipment at home for  patient, will discharge patient to home once the wife feels she is ready  All the records are reviewed and case discussed with Care Management/Social Workerr. Management plans discussed with the patient, family and they are in agreement.  CODE STATUS: Full Code  TOTAL TIME TAKING CARE OF THIS PATIENT: 20 minutes.   POSSIBLE D/C IN 0-10 DAYS, DEPENDING ON PATIENT'S WIFE.   Avel Peace Antwaine Boomhower M.D on 12/05/2018 at 10:19 AM  Between 7am to 6pm - Pager - [phone]  After 6pm go to [email or web address] - password [email or web address] [facility]  Office  [phone]  CC: Primary care physician; [name], MD

## 2018-12-05 NOTE — Procedures (Signed)
Interventional Radiology Procedure Note  Procedure: Gastrostomy tube replacement  Complications: None  Estimated Blood Loss: None  Findings: Jejunal lumen of pre-existing 18 Fr gastrojejunal feeding catheter highly obstructed and could not be declogged with guidewire advancement or forceful flushing.  Tube removed and replaced with new 18 Fr balloon retention gastrostomy tube. A true gastrojejunal tube was not placed today out into jejunum given limitations in supplies in diagnostic fluoroscopy room.  Plan: See if patient tolerates gastric feeds. If he does not, he will need a new balloon GJ tube to be placed over a wire which can be scheduled in IR/Vasc Lab.  Venetia Night. Kathlene Cote, M.D Pager:  [phone]

## 2018-12-05 NOTE — Plan of Care (Signed)
Pt  to have  peg replaced.

## 2018-12-06 ENCOUNTER — Inpatient Hospital Stay: Payer: PPO

## 2018-12-06 LAB — GLUCOSE, CAPILLARY
Glucose-Capillary: 194 mg/dL — ABNORMAL HIGH (ref 70–99)
Glucose-Capillary: 225 mg/dL — ABNORMAL HIGH (ref 70–99)
Glucose-Capillary: 241 mg/dL — ABNORMAL HIGH (ref 70–99)
Glucose-Capillary: 252 mg/dL — ABNORMAL HIGH (ref 70–99)
Glucose-Capillary: 259 mg/dL — ABNORMAL HIGH (ref 70–99)
Glucose-Capillary: 265 mg/dL — ABNORMAL HIGH (ref 70–99)

## 2018-12-06 MED ORDER — CHLORHEXIDINE GLUCONATE 0.12% ORAL RINSE (MEDLINE KIT): 15.0000 mL | Freq: Two times a day (BID) | OROMUCOSAL | 0 refills | Status: AC

## 2018-12-06 MED ORDER — GLUCERNA 1.5 CAL PO LIQD: 1000.0000 mL | ORAL | 0 refills | Status: AC

## 2018-12-06 MED ORDER — COLLAGENASE 250 UNIT/GM EX OINT: TOPICAL_OINTMENT | Freq: Every day | CUTANEOUS | 0 refills | Status: AC

## 2018-12-06 MED ORDER — FREE WATER: 75.0000 mL | 0 refills | Status: AC

## 2018-12-06 MED ORDER — FUROSEMIDE 10 MG/ML IJ SOLN
40.0000 mg | Freq: Once | INTRAMUSCULAR | Status: AC
  Administered 2018-12-06: 13:00:00 40 mg via INTRAVENOUS
  Filled 2018-12-06: qty 4

## 2018-12-06 MED ORDER — ORAL CARE MOUTH RINSE: 15.0000 mL | Freq: Two times a day (BID) | OROMUCOSAL | 0 refills | Status: AC | PRN

## 2018-12-06 MED ORDER — BISACODYL 10 MG RE SUPP: 10.0000 mg | Freq: Every day | RECTAL | 0 refills | Status: DC

## 2018-12-06 NOTE — Progress Notes (Signed)
Made Dr salary aware pt rt arm is significantly larger than left. See new orders

## 2018-12-06 NOTE — Consult Note (Addendum)
[place] Nurse wound follow up Wound type:No change from sacral and buttock unstageable ulcers from last week.  Patient from SNF.  Ready for discharge home and wife refusing discharge  at this time.  Refusing needed equipment until Saturday. Will continue Santyl dressing changes and recommending [place] services to aid with wound care.  Measurement:not measured today.  Wound bed:100% necrotic, devitalized tissue.   Drainage (amount, consistency, odor) Moderate purulent drainage with necrotic odor.  Periwound:  Some partial thickness skin loss noted  Dressing procedure/placement/frequency: Cleanse buttock and sacral wounds with NS and pat dry.  Apply barriercream to periwound and buttocks wounds.  Apply Santyl ointment to wound bed and cover with NS moist gauze.  Secure with dry dressing. Change daily. Recommend HH once discharged to aid with wound care,   Will not follow at this time.  Please re-consult if needed.  Domenic Moras MSN, RN, FNP-BC CWON Wound, Ostomy, Continence Nurse Pager [phone]

## 2018-12-06 NOTE — Progress Notes (Signed)
SATURATION QUALIFICATIONS: (This note is used to comply with regulatory documentation for home oxygen)  Patient Saturations on Room Air at Rest = 85-88%  Patient Saturations on Room Air while Ambulating = UTA%  Patient Saturations on  Liters of oxygen while Ambulating = UTA%  Please briefly explain why patient needs home oxygen: O2 sats 85-88% on RA  O2sats 92% on 2L [place] Pt not able to ambulate

## 2018-12-06 NOTE — Progress Notes (Signed)
Inpatient Diabetes Program Recommendations  AACE/ADA: New Consensus Statement on Inpatient Glycemic Control (2015)  Target Ranges:  Prepandial:   less than 140 mg/dL      Peak postprandial:   less than 180 mg/dL (1-2 hours)      Critically ill patients:  140 - 180 mg/dL    Results for LAMOYNE, HESSEL (MRN [id number]) as of 12/06/2018 10:37  Ref. Range 12/05/2018 00:07 12/05/2018 04:05 12/05/2018 08:00 12/05/2018 08:46 12/05/2018 11:49 12/05/2018 17:37 12/05/2018 20:40  Glucose-Capillary Latest Ref Range: 70 - 99 mg/dL 113 (H) 144 (H)  Novolog HELD 163 (H)  1 unit NOVOLOG  145 (H) 121 (H)  Novolog HELD 212 (H)  Novolog HELD 207 (H)  3 units NOVOLOG +  10 units LANTUS   Results for KOI, ZANGARA (MRN [id number]) as of 12/06/2018 10:37  Ref. Range 12/06/2018 00:16 12/06/2018 04:01 12/06/2018 07:32  Glucose-Capillary Latest Ref Range: 70 - 99 mg/dL 265 (H)  5 units NOVOLOG  194 (H)  Novolog HELD 241 (H)  3 units NOVOLOG      Home DM Meds: Lantus 17 units QHS       Novolog 3 units Q6 hours  Current Orders: Lantus 10 units Q24 hours      Novolog Sensitive Correction Scale/ SSI (0-9 units) Q4 hours     Patient had clogged Gastrostomy tube--Underwent GTube replacement yesterday afternoon 04/14.   Novolog SSi given inconsistently yesterday.    MD- Per RN, Tube feeds were restarted.  Please consider Adding Novolog Tube Feed coverage:  Novolog 4 units Q4 Hours  Please add the following Hold parameters: HOLD if tube feeds HELD for any reason  Also, does patient still need the D5 1/2 NS IVF running at 75cc/hr since Tube feeds have been resumed?    Thanks!!     --Will follow patient during hospitalization--  Wyn Quaker RN, MSN, CDE Diabetes Coordinator Inpatient Glycemic Control Team Team Pager: [phone] (8a-5p)

## 2018-12-06 NOTE — TOC Progression Note (Signed)
Transition of Care [facility]) - Progression Note    Patient Details  Name: VICTORIANO CAMPION MRN: [id number] Date of Birth: May 03, 1956  [facility] CM/SW Contact  Elza Rafter, RN Phone Number: 12/06/2018, 11:35 AM  Clinical Narrative:   Spoke with wife Cecille Rubin today.  All DME is scheduled to be delivered to home tomorrow at 1pm by [facility].  Asked Dr. Jerelyn Charles to print off and electronically send all DC prescriptions today so Cecille Rubin can pick up at pharmacy today to prepare for DC on Saturday.  Will also need enteral feed orders placed today for [employer] to deliver tomorrow; MD aware.      Expected Discharge Plan: [place] Barriers to Discharge: Continued Medical Work up  Expected Discharge Plan and Services Expected Discharge Plan: [place] In-house Referral: [facility] Discharge Planning Services: CM Consult Post Acute Care Choice: Home Health, Durable Medical Equipment Living arrangements for the past 2 months: Single Family Home                 DME Arranged: Oxygen DME Agency: [facility] HH Arranged: RN, PT, OT, Nurse's Aide [place] Agency: [facility]   Social Determinants of Health (SDOH) Interventions    Readmission Risk Interventions Readmission Risk Prevention Plan 11/29/2018  Transportation Screening Complete  Medication Review Press photographer) Complete  PCP or Specialist appointment within 3-5 days of discharge Complete  HRI or [place] Complete  SW Recovery Care/Counseling Consult Complete  [place] Not Applicable  Some recent data might be hidden

## 2018-12-06 NOTE — Progress Notes (Addendum)
O2 sats  85-89 %applied 2 l [place] O2 sats 92%. Notified Dr. Jerelyn Charles. CBG 195-265 has D5 1/2 at 75 and TF running.Marland Kitchenasked to DC fluids. See new orders. Made Dr. Jerelyn Charles aware that pt rt arm is significantly larger then left.

## 2018-12-06 NOTE — Consult Note (Deleted)
[place] Nurse wound follow up Reason for Consult: Reconsult. No change in  sacral ulcer from last week. , patient from SNF- ready for discharge home and wife refuses at this time. Will continue daily Santyl dressing changes as ordered last week.  Recommend [place] services for wound care at discharge.  Wound type:Unstageable pressure injury x 1, Stage 3 pressure injuries x 2 Pressure Injury POA: Yes Measurement: see nursing flow sheets Wound EBX:IDHWYS; 100% black soft necrotic tissue; distal right buttock x 2 100% pink, but full thickness Drainage (amount, consistency, odor) unable to assess, dressing removed by bedside nurse.  Periwound: evidence of further skin injury and related moisture associated skin damage. Dressing procedure/placement/frequency: Add enzymatic debridement ointment for the sacral wound, feel that surgical debridement would not be warranted at this time due to patient's current condition, consider if patient improves Progressa ICU low air loss mattress in place for moisture management and pressure redistribution Maximize nutrition for wound healing when appropriate. Air mattress if patient transfers from the ICU Moisture barrier cream for other pressure injuries on the buttocks due to location   Discussed POC with bedside nurse.  Re consult if needed, will not follow at this time. Thanks  Melody [employer], RN,CWOCN, CNS, [place] [phone])

## 2018-12-06 NOTE — Progress Notes (Addendum)
[facility] [place] NAME: Maurice Patterson    MR#:  [id number]  DATE OF BIRTH:  Aug 03, 1956  SUBJECTIVE:  CHIEF COMPLAINT:   Chief Complaint  Patient presents with   Fever    unresponsive  Patient with chest congestion this morning, chest x-ray findings discussed with radiologist-noted slightly worse from previous-may represent treated pneumonia/aspiration pneumonia, chest congestion noted, discontinue IV fluids, IV Lasix x1, noted upper extremity enlargement-possible IV infiltration, will check upper extremity ultrasound for further evaluation  REVIEW OF SYSTEMS:  Review of Systems  Unable to perform ROS: Patient nonverbal    DRUG ALLERGIES:   Allergies  Allergen Reactions   Hydrocodone Anaphylaxis   Morphine Other (See Comments)    Loss of memory   Quetiapine Other (See Comments)    Insomnia, agitation, anxiety   Ambien [Zolpidem] Other (See Comments)    delirium     Brilinta [Ticagrelor] Other (See Comments)    Stroke    Flexeril [Cyclobenzaprine] Other (See Comments)    delerium    Flunitrazepam Other (See Comments)    ROHYPNOL (hallucinations)   Haldol [Haloperidol Lactate] Other (See Comments)    delerium    Levetiracetam Diarrhea and Other (See Comments)    Unable to walk   Lorazepam Hives   Risperdal [Risperidone] Other (See Comments)    Delirium    Trazodone Other (See Comments)    Delirium Can take in low doses    Benadryl [Diphenhydramine Hcl (Sleep)] Rash   Penicillins Rash    Mouth ulcers Has patient had a PCN reaction causing immediate rash, facial/tongue/throat swelling, SOB or lightheadedness with hypotension: Yes Has patient had a PCN reaction causing severe rash involving mucus membranes or skin necrosis: No Has patient had a PCN reaction that required hospitalization No Has patient had a PCN reaction occurring within the last 10 years: Yes If all of the above answers are "NO", then  may proceed with Cephalosporin use.   Vancomycin Rash    Rash around IV site during vancomycin infusion    VITALS:  Blood pressure 115/67, pulse 96, temperature 98.5 F (36.9 C), temperature source Axillary, resp. rate 19, height 5\' 6"  (1.676 m), weight 64.6 kg, SpO2 92 %.  PHYSICAL EXAMINATION:  Physical Exam   GENERAL:  63 y.o.-year-old ill appearing patient lying in the bed with no acute distress.  EYES: Pupils equal, round, reactive to light and accommodation. No scleral icterus. Extraocular muscles intact.  HEENT: Head atraumatic, normocephalic. Oropharynx and nasopharynx clear.  NECK:  Supple, no jugular venous distention. No thyroid enlargement, no tenderness.  LUNGS: Moving air bilaterally, no wheezing, rales,rhonchi or crepitation. No use of accessory muscles of respiration. Decreased bibasilar breath sounds CARDIOVASCULAR: S1, S2 normal. No  rubs, or gallops. 2/6 systolic murmur present ABDOMEN: Soft, nontender, nondistended. Bowel sounds present. Has  PEG tube in place. No organomegaly or mass.  EXTREMITIES: No pedal edema, cyanosis, or clubbing.  NEUROLOGIC: Patient is lethargic, when alert, tracking, not following commands or speaking  PSYCHIATRIC: The patient is lethargic.  SKIN: No obvious rash, lesion, or ulcer.    LABORATORY PANEL:   CBC Recent Labs  Lab 12/02/18 1814  WBC 11.4*  HGB 12.4*  HCT 42.0  PLT 476*   ------------------------------------------------------------------------------------------------------------------  Chemistries  Recent Labs  Lab 12/02/18 0535  NA 142  K 3.6  CL 107  CO2 29  GLUCOSE 170*  BUN 11  CREATININE <0.30*  CALCIUM 7.7*  MG 2.1   ------------------------------------------------------------------------------------------------------------------  Cardiac Enzymes No results for input(s): TROPONINI in the last 168  hours. ------------------------------------------------------------------------------------------------------------------  RADIOLOGY:  Dg Perc Gastrostomy Tube Insert W/fluoro  Result Date: 12/05/2018 INDICATION: Occluded gastrojejunal feeding tube. EXAM: GASTROSTOMY TUBE REPLACEMENT UNDER FLUOROSCOPY MEDICATIONS: None ANESTHESIA/SEDATION: None CONTRAST:  10 mL Isovue-300-administered into the gastric lumen. FLUOROSCOPY TIME:  Fluoroscopy Time: 18 seconds.  1.9 mGy. COMPLICATIONS: None immediate. PROCEDURE: The pre-existing gastrojejunal feeding tube was assessed by initial fluoroscopy followed by advancement of a guidewire into the jejunal lumen. Attempt was made to flush the jejunal lumen. The gastrojejunal feeding tube was removed after deflating the retention balloon. A new 25 French balloon retention gastrostomy tube was advanced through the chronic tract. The retention balloon was inflated with 8 mL of saline and brought up to the anterior gastric wall. The tube was injected with contrast material and a fluoroscopic spot image obtained to confirm position. FINDINGS: The jejunal lumen of the 22 French balloon retention gastrojejunal feeding tube is completely and highly occluded. Occlusion could not be alleviated with passage of a guidewire and attempted forceful flushing. The procedure was performed in a diagnostic fluoroscopy room and given that the obstructed tube had to be entirely removed, there was not a capability today to perform further catheter and guidewire advancement into the duodenum and jejunum to allow placement of a true gastrojejunal tube. A gastrostomy tube was therefore placed with the tip located in the distal body of the stomach. IMPRESSION: 1. Highly obstructed gastrojejunal feeding tube replaced with an 17 French balloon retention gastrostomy tube located in the body of the stomach. 2. If the patient does not tolerate gastric feeds, a true gastrojejunal catheter can be placed at a  later time in the IR/vascular suite. Electronically Signed   By: Aletta Edouard M.D.   On: 12/05/2018 15:23    EKG:   Orders placed or performed during the hospital encounter of 11/24/18   ED EKG 12-Lead   ED EKG 12-Lead   EKG 12-Lead   EKG 12-Lead   EKG 12-Lead   EKG 12-Lead    ASSESSMENT AND PLAN:  63 year old male with past medical history significant for rapidly progressive dementia, nonverbal at baseline, history of stroke, CAD, CKD, CHF, recent admission to [facility] for intraparenchymal left cerebral hemorrhage comes from nursing home secondary to sepsis secondary to pneumonia  *Acute hypoxic respiratory failure on admission-secondary to pneumonia, concern for aspiration. Patient was intubated on admission, extubated on 11/26/2018 and was  on 3 L oxygen then transitioned to high flow after a possible aspiration event on the floor COVID-19 negative. Infectious disease did see patient while in house-treated with course of cefepime/Unasyn  Repeat chest x-ray noted for questionable worsening infiltrate-and discussion with radiologist/may represent previously treated pneumonia weaned back to 2-3L o2 and off high flow oxygen  *Acute recurrent PEG tube dysfunction  Resolved Status post gastric tube replacement on December 05, 2018 by radiology   *Nonverbal and rapidly progressive dementia at baseline at baseline, alert and tracking. Continue Aricept and Namenda  *Sepsis secondary to pneumonia Resolved Treated on our sepsis protocol, weaned off pressor support, treated with course of antibiotics  *Diabetes mellitus Stable on current regiment   *Hypokalemia Replaced  *Chronic pressure ulcerations Continue local wound care, wound care nurse consulted  Overall poor prognosis with recurrent long hospitalizations.  Palliative care consulted/following, pt remains a full code, wife wants patient to come home on Saturday - unable to be discharged until then per her wishes, patient  is ready for discharge today, in  discussion with case management/social work-the patient's wife has been refusing to accept equipment at home for patient, will discharge patient to home once the wife feels she is ready  All the records are reviewed and case discussed with Care Management/Social Workerr. Management plans discussed with the patient, family and they are in agreement.  CODE STATUS: Full Code  TOTAL TIME TAKING CARE OF THIS PATIENT: 61minutes.   POSSIBLE D/C IN 0-10 DAYS, DEPENDING ON PATIENT'S WIFE.   Avel Peace Tericka Devincenzi M.D on 12/06/2018 at 1:14 PM  Between 7am to 6pm - Pager - [phone]  After 6pm go to [email or web address] - password [email or web address] [facility]  Office  [phone]  CC: Primary care physician; [name], MD

## 2018-12-07 LAB — CBC
HCT: 35.1 % — ABNORMAL LOW (ref 39.0–52.0)
Hemoglobin: 11.2 g/dL — ABNORMAL LOW (ref 13.0–17.0)
MCH: 28 pg (ref 26.0–34.0)
MCHC: 31.9 g/dL (ref 30.0–36.0)
MCV: 87.8 fL (ref 80.0–100.0)
Platelets: 433 10*3/uL — ABNORMAL HIGH (ref 150–400)
RBC: 4 MIL/uL — ABNORMAL LOW (ref 4.22–5.81)
RDW: 17 % — ABNORMAL HIGH (ref 11.5–15.5)
WBC: 15.6 10*3/uL — ABNORMAL HIGH (ref 4.0–10.5)
nRBC: 0 % (ref 0.0–0.2)

## 2018-12-07 LAB — GLUCOSE, CAPILLARY
Glucose-Capillary: 164 mg/dL — ABNORMAL HIGH (ref 70–99)
Glucose-Capillary: 170 mg/dL — ABNORMAL HIGH (ref 70–99)
Glucose-Capillary: 177 mg/dL — ABNORMAL HIGH (ref 70–99)
Glucose-Capillary: 188 mg/dL — ABNORMAL HIGH (ref 70–99)
Glucose-Capillary: 220 mg/dL — ABNORMAL HIGH (ref 70–99)
Glucose-Capillary: 228 mg/dL — ABNORMAL HIGH (ref 70–99)

## 2018-12-07 LAB — URINALYSIS, COMPLETE (UACMP) WITH MICROSCOPIC
Bilirubin Urine: NEGATIVE
Glucose, UA: NEGATIVE mg/dL
Hgb urine dipstick: NEGATIVE
Ketones, ur: NEGATIVE mg/dL
Nitrite: NEGATIVE
Protein, ur: NEGATIVE mg/dL
Specific Gravity, Urine: 1.024 (ref 1.005–1.030)
Squamous Epithelial / HPF: NONE SEEN (ref 0–5)
pH: 5 (ref 5.0–8.0)

## 2018-12-07 LAB — STREP PNEUMONIAE URINARY ANTIGEN: Strep Pneumo Urinary Antigen: NEGATIVE

## 2018-12-07 MED ORDER — FUROSEMIDE 40 MG PO TABS: 40.0000 mg | ORAL_TABLET | Freq: Every day | ORAL | 0 refills | Status: AC

## 2018-12-07 MED ORDER — POTASSIUM CHLORIDE 20 MEQ PO PACK: 20.0000 meq | PACK | ORAL | 0 refills | Status: AC

## 2018-12-07 MED ORDER — LACOSAMIDE 50 MG PO TABS: 100.0000 mg | ORAL_TABLET | Freq: Two times a day (BID) | ORAL | 0 refills | Status: AC

## 2018-12-07 MED ORDER — FUROSEMIDE 10 MG/ML IJ SOLN
40.0000 mg | Freq: Once | INTRAMUSCULAR | Status: AC
  Administered 2018-12-07: 13:00:00 40 mg via INTRAVENOUS
  Filled 2018-12-07: qty 4

## 2018-12-07 MED ORDER — PANTOPRAZOLE SODIUM 40 MG PO PACK: 40.0000 mg | PACK | Freq: Every day | ORAL | 0 refills | Status: AC

## 2018-12-07 MED ORDER — INSULIN GLARGINE 100 UNIT/ML ~~LOC~~ SOLN: 15.0000 [IU] | Freq: Every day | SUBCUTANEOUS | 0 refills | Status: AC

## 2018-12-07 MED ORDER — FUROSEMIDE 40 MG PO TABS
40.0000 mg | ORAL_TABLET | Freq: Every day | ORAL | Status: DC
  Administered 2018-12-08 – 2018-12-09 (×2): 40 mg via ORAL
  Filled 2018-12-07 (×2): qty 1

## 2018-12-07 MED ORDER — SODIUM CHLORIDE 0.9% IV SOLUTION: 500.0000 mL | Freq: Every day | INTRAVENOUS | 0 refills | Status: AC

## 2018-12-07 MED ORDER — ASCORBIC ACID 250 MG PO TABS: 250.0000 mg | ORAL_TABLET | Freq: Two times a day (BID) | ORAL | 0 refills | Status: AC

## 2018-12-07 MED ORDER — SENNOSIDES-DOCUSATE SODIUM 8.6-50 MG PO TABS: 2.0000 | ORAL_TABLET | Freq: Every evening | ORAL | 0 refills | Status: AC

## 2018-12-07 MED ORDER — ASPIRIN 81 MG PO CHEW: 81.0000 mg | CHEWABLE_TABLET | Freq: Every day | ORAL | 0 refills | Status: AC

## 2018-12-07 MED ORDER — CLOPIDOGREL BISULFATE 75 MG PO TABS: 75.0000 mg | ORAL_TABLET | Freq: Every day | ORAL | 0 refills | Status: AC

## 2018-12-07 MED ORDER — HYDROXYZINE HCL 10 MG/5ML PO SYRP
10.0000 mg | ORAL_SOLUTION | Freq: Every day | ORAL | Status: DC
  Administered 2018-12-07 – 2018-12-09 (×3): 10 mg via ORAL
  Filled 2018-12-07 (×4): qty 5

## 2018-12-07 MED ORDER — LIDOCAINE 5 % EX PTCH: 1.0000 | MEDICATED_PATCH | CUTANEOUS | 0 refills | Status: AC

## 2018-12-07 MED ORDER — LOSARTAN POTASSIUM 100 MG PO TABS: 100.0000 mg | ORAL_TABLET | Freq: Every day | ORAL | 0 refills | Status: AC

## 2018-12-07 MED ORDER — NYSTATIN 100000 UNIT/ML MT SUSP: 5.0000 mL | Freq: Four times a day (QID) | OROMUCOSAL | 0 refills | Status: AC

## 2018-12-07 MED ORDER — DONEPEZIL HCL 10 MG PO TABS: 10.0000 mg | ORAL_TABLET | Freq: Two times a day (BID) | ORAL | 0 refills | Status: AC

## 2018-12-07 MED ORDER — BISACODYL 10 MG RE SUPP: 10.0000 mg | Freq: Every day | RECTAL | 0 refills | Status: AC

## 2018-12-07 MED ORDER — ACETAMINOPHEN 325 MG PO TABS
650.0000 mg | ORAL_TABLET | Freq: Three times a day (TID) | ORAL | Status: DC
  Administered 2018-12-07 – 2018-12-09 (×6): 650 mg via ORAL
  Filled 2018-12-07 (×6): qty 2

## 2018-12-07 MED ORDER — HYDROGEL GEL: 1.0000 "application " | Freq: Every day | 0 refills | Status: AC

## 2018-12-07 MED ORDER — ISOSORBIDE MONONITRATE ER 30 MG PO TB24: 30.0000 mg | ORAL_TABLET | Freq: Two times a day (BID) | ORAL | 0 refills | Status: AC

## 2018-12-07 MED ORDER — CHOLECALCIFEROL 10 MCG/ML (400 UNIT/ML) PO LIQD: 1000.0000 [IU] | ORAL | 0 refills | Status: AC

## 2018-12-07 MED ORDER — INSULIN ASPART 100 UNIT/ML ~~LOC~~ SOLN: 3.0000 [IU] | Freq: Four times a day (QID) | SUBCUTANEOUS | 0 refills | Status: AC

## 2018-12-07 MED ORDER — LOPERAMIDE HCL 2 MG PO CAPS: 2.0000 mg | ORAL_CAPSULE | ORAL | 0 refills | Status: AC | PRN

## 2018-12-07 MED ORDER — ATORVASTATIN CALCIUM 80 MG PO TABS: 80.0000 mg | ORAL_TABLET | Freq: Every day | ORAL | 0 refills | Status: AC

## 2018-12-07 MED ORDER — DOXAZOSIN MESYLATE 2 MG PO TABS: 2.0000 mg | ORAL_TABLET | Freq: Every evening | ORAL | 0 refills | Status: AC

## 2018-12-07 MED ORDER — AMITRIPTYLINE HCL 25 MG PO TABS
25.0000 mg | ORAL_TABLET | Freq: Every day | ORAL | Status: DC
  Administered 2018-12-07 – 2018-12-08 (×2): 25 mg via ORAL
  Filled 2018-12-07 (×2): qty 1

## 2018-12-07 MED ORDER — SODIUM CHLORIDE 0.9% FLUSH: 10.0000 mL | INTRAVENOUS | Status: DC | PRN

## 2018-12-07 MED ORDER — METOPROLOL TARTRATE 25 MG PO TABS: 25.0000 mg | ORAL_TABLET | Freq: Two times a day (BID) | ORAL | 0 refills | Status: AC

## 2018-12-07 MED ORDER — ORAL CARE MOUTH RINSE
15.0000 mL | Freq: Two times a day (BID) | OROMUCOSAL | Status: DC
  Administered 2018-12-07 – 2018-12-09 (×5): 15 mL via OROMUCOSAL

## 2018-12-07 MED ORDER — GUAIFENESIN 100 MG/5ML PO SOLN
10.0000 mL | Freq: Three times a day (TID) | ORAL | Status: DC
  Administered 2018-12-07 – 2018-12-09 (×7): 200 mg
  Filled 2018-12-07 (×2): qty 10
  Filled 2018-12-07: qty 20
  Filled 2018-12-07: qty 10
  Filled 2018-12-07: qty 20
  Filled 2018-12-07 (×2): qty 10

## 2018-12-07 MED ORDER — MEMANTINE HCL 10 MG PO TABS: 10.0000 mg | ORAL_TABLET | Freq: Two times a day (BID) | ORAL | 0 refills | Status: AC

## 2018-12-07 MED ORDER — SODIUM CHLORIDE 0.9% FLUSH
10.0000 mL | Freq: Two times a day (BID) | INTRAVENOUS | Status: DC
  Administered 2018-12-07 – 2018-12-09 (×4): 10 mL

## 2018-12-07 MED ORDER — VANCOMYCIN HCL 10 G IV SOLR
1250.0000 mg | INTRAVENOUS | Status: DC
  Administered 2018-12-07 – 2018-12-08 (×2): 1250 mg via INTRAVENOUS
  Filled 2018-12-07 (×2): qty 1250

## 2018-12-07 MED ORDER — ACETAMINOPHEN 500 MG PO TABS: 500.0000 mg | ORAL_TABLET | Freq: Four times a day (QID) | ORAL | 0 refills | Status: AC | PRN

## 2018-12-07 MED ORDER — DIPHENHYDRAMINE HCL 25 MG PO CAPS: 25.0000 mg | ORAL_CAPSULE | Freq: Every day | ORAL | Status: DC

## 2018-12-07 MED ORDER — MELOXICAM 7.5 MG PO TABS
15.0000 mg | ORAL_TABLET | Freq: Every day | ORAL | Status: DC
  Administered 2018-12-07 – 2018-12-09 (×3): 15 mg via ORAL
  Filled 2018-12-07 (×3): qty 2

## 2018-12-07 MED ORDER — SODIUM CHLORIDE 0.9 % IV SOLN
1.0000 g | Freq: Three times a day (TID) | INTRAVENOUS | Status: DC
  Administered 2018-12-07 – 2018-12-08 (×4): 1 g via INTRAVENOUS
  Filled 2018-12-07 (×6): qty 1

## 2018-12-07 MED ORDER — FOLIC ACID 1 MG PO TABS: 1.0000 mg | ORAL_TABLET | Freq: Every day | ORAL | 0 refills | Status: AC

## 2018-12-07 MED ORDER — METOCLOPRAMIDE HCL 5 MG PO TABS: 5.0000 mg | ORAL_TABLET | Freq: Three times a day (TID) | ORAL | 0 refills | Status: AC

## 2018-12-07 NOTE — Care Management Important Message (Signed)
Important Message  Patient Details  Name: Maurice Patterson MRN: [id number] Date of Birth: Jun 21, 1956   Medicare Important Message Given:  Yes    Dannette Barbara 12/07/2018, 12:31 PM

## 2018-12-07 NOTE — Consult Note (Signed)
Pharmacy Antibiotic Note  Maurice Patterson is a 63 y.o. male admitted on 11/24/2018 with pneumonia.  Pharmacy has been consulted for vancomycin dosing.  Plan: Will start vancomycin 1250 mg q24H. Predicted AUC 549. Goal AUC is 400-550. Used Scr 0.8 for calculation. Plan to get levels on 4-5 day. Plan for 8 days of therapy. MRSA PCR negative but pt continues to spike fevers.    Height: 5\' 6"  (167.6 cm) Weight: 111 lb 15.9 oz (50.8 kg) IBW/kg (Calculated) : 63.8  Temp (24hrs), Avg:99.7 F (37.6 C), Min:98.4 F (36.9 C), Max:102.5 F (39.2 C)  Recent Labs  Lab 12/01/18 0537 12/02/18 0535 12/02/18 1814 12/02/18 2006 12/02/18 2152  WBC  --   --  11.4*  --   --   CREATININE 0.30* <0.30*  --   --   --   LATICACIDVEN  --   --  2.2* 2.4* 1.9    CrCl cannot be calculated (This lab value cannot be used to calculate CrCl because it is not a number: <0.30).    Allergies  Allergen Reactions  . Hydrocodone Anaphylaxis  . Morphine Other (See Comments)    Loss of memory  . Quetiapine Other (See Comments)    Insomnia, agitation, anxiety  . Ambien [Zolpidem] Other (See Comments)    delirium    . Brilinta [Ticagrelor] Other (See Comments)    Stroke   . Flexeril [Cyclobenzaprine] Other (See Comments)    delerium   . Flunitrazepam Other (See Comments)    ROHYPNOL (hallucinations)  . Haldol [Haloperidol Lactate] Other (See Comments)    delerium   . Levetiracetam Diarrhea and Other (See Comments)    Unable to walk  . Lorazepam Hives  . Risperdal [Risperidone] Other (See Comments)    Delirium   . Trazodone Other (See Comments)    Delirium Can take in low doses   . Benadryl [Diphenhydramine Hcl (Sleep)] Rash  . Penicillins Rash    Mouth ulcers Has patient had a PCN reaction causing immediate rash, facial/tongue/throat swelling, SOB or lightheadedness with hypotension: Yes Has patient had a PCN reaction causing severe rash involving mucus membranes or skin necrosis: No Has patient  had a PCN reaction that required hospitalization No Has patient had a PCN reaction occurring within the last 10 years: Yes If all of the above answers are "NO", then may proceed with Cephalosporin use.  . Vancomycin Rash    Rash around IV site during vancomycin infusion    Antimicrobials this admission: 4/3 cefepime >> 4/9 4/3 azithromycin >> 4/5 4/3 clindamycin >> 4/5 4/5 doxycycline x 1 4/9 Unasyn >> 4/11 4/16 cefepime >>  4/16 vancomycin >>   Dose adjustments this admission: None  Microbiology results: 4/3 BCx: NGTD  4/3 MRSA PCR: Negative   Thank you for allowing pharmacy to be a part of this patient's care.  Oswald Hillock, PharmD, BCPS  12/07/2018 8:30 AM

## 2018-12-07 NOTE — Progress Notes (Addendum)
[facility] at [place] NAME: Maurice Patterson    MR#:  [id number]  DATE OF BIRTH:  [birth year]  SUBJECTIVE:  CHIEF COMPLAINT:   Chief Complaint  Patient presents with  . Fever    unresponsive  Patient with continued productive cough/chest congestion, now spiking fevers consistent with recurrent acute aspiration pneumonia/H CAP, pneumonia protocol initiated REVIEW OF SYSTEMS:  Review of Systems  Unable to perform ROS: Patient nonverbal    DRUG ALLERGIES:   Allergies  Allergen Reactions  . Hydrocodone Anaphylaxis  . Morphine Other (See Comments)    Loss of memory  . Quetiapine Other (See Comments)    Insomnia, agitation, anxiety  . Ambien [Zolpidem] Other (See Comments)    delirium    . Brilinta [Ticagrelor] Other (See Comments)    Stroke   . Flexeril [Cyclobenzaprine] Other (See Comments)    delerium   . Flunitrazepam Other (See Comments)    ROHYPNOL (hallucinations)  . Haldol [Haloperidol Lactate] Other (See Comments)    delerium   . Levetiracetam Diarrhea and Other (See Comments)    Unable to walk  . Lorazepam Hives  . Risperdal [Risperidone] Other (See Comments)    Delirium   . Trazodone Other (See Comments)    Delirium Can take in low doses   . Benadryl [Diphenhydramine Hcl (Sleep)] Rash  . Penicillins Rash    Mouth ulcers Has patient had a PCN reaction causing immediate rash, facial/tongue/throat swelling, SOB or lightheadedness with hypotension: Yes Has patient had a PCN reaction causing severe rash involving mucus membranes or skin necrosis: No Has patient had a PCN reaction that required hospitalization No Has patient had a PCN reaction occurring within the last 10 years: Yes If all of the above answers are "NO", then may proceed with Cephalosporin use.  . Vancomycin Rash    Rash around IV site during vancomycin infusion    VITALS:  Blood pressure 105/66, pulse (!) 113, temperature 98.6 F (37 C),  temperature source Oral, resp. rate (!) 24, height 5\' 6"  (1.676 m), weight 50.8 kg, SpO2 94 %.  PHYSICAL EXAMINATION:  Physical Exam   GENERAL:  63 y.o.-year-old ill appearing patient lying in the bed with no acute distress.  EYES: Pupils equal, round, reactive to light and accommodation. No scleral icterus. Extraocular muscles intact.  HEENT: Head atraumatic, normocephalic. Oropharynx and nasopharynx clear.  NECK:  Supple, no jugular venous distention. No thyroid enlargement, no tenderness.  LUNGS: Moving air bilaterally, no wheezing, rales,rhonchi or crepitation. No use of accessory muscles of respiration. Decreased bibasilar breath sounds CARDIOVASCULAR: S1, S2 normal. No  rubs, or gallops. 2/6 systolic murmur present ABDOMEN: Soft, nontender, nondistended. Bowel sounds present. Has  PEG tube in place. No organomegaly or mass.  EXTREMITIES: No pedal edema, cyanosis, or clubbing.  NEUROLOGIC: Patient is lethargic, when alert, tracking, not following commands or speaking  PSYCHIATRIC: The patient is lethargic.  SKIN: No obvious rash, lesion, or ulcer.    LABORATORY PANEL:   CBC Recent Labs  Lab 12/07/18 0847  WBC 15.6*  HGB 11.2*  HCT 35.1*  PLT 433*   ------------------------------------------------------------------------------------------------------------------  Chemistries  Recent Labs  Lab 12/02/18 0535  NA 142  K 3.6  CL 107  CO2 29  GLUCOSE 170*  BUN 11  CREATININE <0.30*  CALCIUM 7.7*  MG 2.1   ------------------------------------------------------------------------------------------------------------------  Cardiac Enzymes No results for input(s): TROPONINI in the last 168 hours. ------------------------------------------------------------------------------------------------------------------  RADIOLOGY:  US Venous Img Upper Uni Right  Result Date: 12/06/2018 CLINICAL DATA:  Swelling EXAM: UPPER EXTREMITY VENOUS DOPPLER ULTRASOUND TECHNIQUE: Gray-scale  sonography with graded compression, as well as color Doppler and duplex ultrasound were performed to evaluate the upper extremity deep venous system from the level of the subclavian vein and including the jugular, axillary, basilic and upper cephalic vein. Spectral Doppler was utilized to evaluate flow at rest and with distal augmentation maneuvers. Technologist describes technically difficult study due to overlying bandages and edema, patient confusion with lack of cooperation. COMPARISON:  None. FINDINGS: Thrombus within deep veins:  None visualized. Compressibility of deep veins:  Normal. Duplex waveform respiratory phasicity:  Normal. Duplex waveform response to augmentation:  Normal. Venous reflux:  None visualized. Other findings: Limited images of the contralateral subclavian vein unremarkable. IMPRESSION: No evidence of right upper extremity DVT. Electronically Signed   By: Lucrezia Europe M.D.   On: 12/06/2018 15:33   Dg Chest Port 1 View  Result Date: 12/06/2018 CLINICAL DATA:  Increased oxygen demand.  Recent treated pneumonia. EXAM: PORTABLE CHEST 1 VIEW COMPARISON:  12/02/2018 FINDINGS: Stable heart size. Increased opacity in the right upper and lower lung zones may relate to pneumonia versus atelectasis. Mild pulmonary interstitial and vascular prominence present which appears largely chronic. No evidence of pleural fluid or pneumothorax. Stable old right-sided rib fractures. IMPRESSION: Increased opacity in both right upper and lower lung zones may relate to residual pneumonia versus atelectasis. Mild pulmonary interstitial and vascular prominence appears largely chronic. Electronically Signed   By: Aletta Edouard M.D.   On: 12/06/2018 13:26   Dg Perc Gastrostomy Tube Insert W/fluoro  Result Date: 12/05/2018 INDICATION: Occluded gastrojejunal feeding tube. EXAM: GASTROSTOMY TUBE REPLACEMENT UNDER FLUOROSCOPY MEDICATIONS: None ANESTHESIA/SEDATION: None CONTRAST:  10 mL Isovue-300-administered into  the gastric lumen. FLUOROSCOPY TIME:  Fluoroscopy Time: 18 seconds.  1.9 mGy. COMPLICATIONS: None immediate. PROCEDURE: The pre-existing gastrojejunal feeding tube was assessed by initial fluoroscopy followed by advancement of a guidewire into the jejunal lumen. Attempt was made to flush the jejunal lumen. The gastrojejunal feeding tube was removed after deflating the retention balloon. A new 37 French balloon retention gastrostomy tube was advanced through the chronic tract. The retention balloon was inflated with 8 mL of saline and brought up to the anterior gastric wall. The tube was injected with contrast material and a fluoroscopic spot image obtained to confirm position. FINDINGS: The jejunal lumen of the 30 French balloon retention gastrojejunal feeding tube is completely and highly occluded. Occlusion could not be alleviated with passage of a guidewire and attempted forceful flushing. The procedure was performed in a diagnostic fluoroscopy room and given that the obstructed tube had to be entirely removed, there was not a capability today to perform further catheter and guidewire advancement into the duodenum and jejunum to allow placement of a true gastrojejunal tube. A gastrostomy tube was therefore placed with the tip located in the distal body of the stomach. IMPRESSION: 1. Highly obstructed gastrojejunal feeding tube replaced with an 73 French balloon retention gastrostomy tube located in the body of the stomach. 2. If the patient does not tolerate gastric feeds, a true gastrojejunal catheter can be placed at a later time in the IR/vascular suite. Electronically Signed   By: Aletta Edouard M.D.   On: 12/05/2018 15:23    EKG:   Orders placed or performed during the hospital encounter of 11/24/18  . ED EKG 12-Lead  . ED EKG 12-Lead  . EKG 12-Lead  . EKG 12-Lead  . EKG 12-Lead  .  EKG 12-Lead    ASSESSMENT AND PLAN:  63 year old male with past medical history significant for rapidly  progressive dementia, nonverbal at baseline, history of stroke, CAD, CKD, CHF, recent admission to [facility] for intraparenchymal left cerebral hemorrhage comes from nursing home secondary to sepsis secondary to pneumonia  *Acute hypoxic respiratory failure on admission secondary to aspiration pneumonia Stable Patient did require short stay in the ICU, extubated 11/26/2018, COVID-19 negative. Infectious disease did see patient while in house-treated with course of cefepime/Unasyn   *Acute recurrent/H CAP aspiration pneumonia Completed empiric course of antibiotics with cefepime/Unasyn Developed second episode December 07, 2018-pneumonia protocol initiated, started on cefepime, follow-up on outstanding cultures  *Acute recurrent PEG tube dysfunction  Resolved Status post gastric tube replacement on December 05, 2018 by radiology   *Nonverbal and rapidly progressive dementia at baseline at baseline, alert and tracking. Continue Aricept and Namenda  *Sepsis secondary to pneumonia Resolved Treated on our sepsis protocol, weaned off pressor support, treated with course of antibiotics  *Diabetes mellitus Stable on current regiment   *Hypokalemia Replaced  *Chronic pressure ulcerations Continue local wound care, wound care nurse consulted  Overall poor prognosis with recurrent long hospitalizations, palliative care following, wife wants patient to come home on Saturday, per case management/social work -the patient's wife has been refusing to accept hospital equipment at home for patient, will discharge patient to home once the wife feels she is ready  All the records are reviewed and case discussed with Care Management/Social Workerr. Management plans discussed with the patient, family and they are in agreement.  CODE STATUS: Full Code  TOTAL TIME TAKING CARE OF THIS PATIENT: 52minutes.   POSSIBLE D/C IN 0-10 DAYS, DEPENDING ON PATIENT'S WIFE.   Avel Peace Kori Colin M.D on 12/07/2018 at 11:18 AM   Between 7am to 6pm - Pager - [phone]  After 6pm go to [email or web address] - password [email or web address] [facility]  Office  [phone]  CC: Primary care physician; [name], MD

## 2018-12-07 NOTE — Progress Notes (Signed)
Inpatient Diabetes Program Recommendations  AACE/ADA: New Consensus Statement on Inpatient Glycemic Control   Target Ranges:  Prepandial:   less than 140 mg/dL      Peak postprandial:   less than 180 mg/dL (1-2 hours)      Critically ill patients:  140 - 180 mg/dL   Results for GENERAL, WEARING (MRN [id number]) as of 12/07/2018 08:44  Ref. Range 12/06/2018 07:32 12/06/2018 11:28 12/06/2018 16:55 12/06/2018 20:13 12/07/2018 00:04 12/07/2018 04:28 12/07/2018 08:01  Glucose-Capillary Latest Ref Range: 70 - 99 mg/dL 241 (H) 259 (H) 252 (H) 225 (H) 220 (H) 228 (H) 177 (H)   Review of Glycemic Control  Current orders for Inpatient glycemic control: Lantus 10 units Q24H, Novolog 0-9 units Q4H; Glucerna @ 50 ml/hr  Inpatient Diabetes Program Recommendations:   Insulin -Tube Feeding Coverage: Please consider ordering Novolog 4 units Q4H for tube feeding coverage. If tube feeding is stopped or held, then Novolog tube feeding coverage should also be stopped or held.  Thanks, Barnie Alderman, RN, MSN, CDE Diabetes Coordinator Inpatient Diabetes Program [phone] (Team Pager from 8am to 5pm)

## 2018-12-07 NOTE — TOC Progression Note (Signed)
Transition of Care [facility]) - Progression Note    Patient Details  Name: Maurice Patterson MRN: [id number] Date of Birth: 1955-10-18  [facility] of [facility] CM/SW Contact  Elza Rafter, RN Phone Number: 12/07/2018, 3:37 PM  Clinical Narrative:   Damaris Schooner with Cecille Rubin, patients wife.  All equipment was delivered today and everything went smoothly.  Son Gerald Stabs is coming to [place] to pick up prescriptions.      Expected Discharge Plan: [place] Barriers to Discharge: Continued Medical Work up  Expected Discharge Plan and Services Expected Discharge Plan: [place] In-house Referral: [facility] Discharge Planning Services: CM Consult Post Acute Care Choice: Home Health, Durable Medical Equipment Living arrangements for the past 2 months: Single Family Home                 DME Arranged: Oxygen DME Agency: [facility] HH Arranged: RN, PT, OT, Nurse's Aide [place] Agency: [facility] ([facility])   Social Determinants of Health (SDOH) Interventions    Readmission Risk Interventions Readmission Risk Prevention Plan 11/29/2018  Transportation Screening Complete  Medication Review Press photographer) Complete  PCP or Specialist appointment within 3-5 days of discharge Complete  HRI or [place] Complete  SW Recovery Care/Counseling Consult Complete  [place] Not Applicable  Some recent data might be hidden

## 2018-12-08 ENCOUNTER — Inpatient Hospital Stay: Payer: PPO

## 2018-12-08 DIAGNOSIS — R6520 Severe sepsis without septic shock: Secondary | ICD-10-CM

## 2018-12-08 DIAGNOSIS — A419 Sepsis, unspecified organism: Principal | ICD-10-CM

## 2018-12-08 LAB — CBC
HCT: 36.2 % — ABNORMAL LOW (ref 39.0–52.0)
Hemoglobin: 11.1 g/dL — ABNORMAL LOW (ref 13.0–17.0)
MCH: 27.8 pg (ref 26.0–34.0)
MCHC: 30.7 g/dL (ref 30.0–36.0)
MCV: 90.7 fL (ref 80.0–100.0)
Platelets: 366 10*3/uL (ref 150–400)
RBC: 3.99 MIL/uL — ABNORMAL LOW (ref 4.22–5.81)
RDW: 17.1 % — ABNORMAL HIGH (ref 11.5–15.5)
WBC: 12.8 10*3/uL — ABNORMAL HIGH (ref 4.0–10.5)
nRBC: 0 % (ref 0.0–0.2)

## 2018-12-08 LAB — GLUCOSE, CAPILLARY
Glucose-Capillary: 113 mg/dL — ABNORMAL HIGH (ref 70–99)
Glucose-Capillary: 135 mg/dL — ABNORMAL HIGH (ref 70–99)
Glucose-Capillary: 142 mg/dL — ABNORMAL HIGH (ref 70–99)
Glucose-Capillary: 144 mg/dL — ABNORMAL HIGH (ref 70–99)
Glucose-Capillary: 155 mg/dL — ABNORMAL HIGH (ref 70–99)
Glucose-Capillary: 190 mg/dL — ABNORMAL HIGH (ref 70–99)
Glucose-Capillary: 98 mg/dL (ref 70–99)

## 2018-12-08 LAB — BASIC METABOLIC PANEL
Anion gap: 8 (ref 5–15)
BUN: 19 mg/dL (ref 8–23)
CO2: 28 mmol/L (ref 22–32)
Calcium: 8.3 mg/dL — ABNORMAL LOW (ref 8.9–10.3)
Chloride: 102 mmol/L (ref 98–111)
Creatinine, Ser: 0.36 mg/dL — ABNORMAL LOW (ref 0.61–1.24)
GFR calc Af Amer: >60 mL/min (ref >60)
GFR calc non Af Amer: >60 mL/min (ref >60)
Glucose, Bld: 111 mg/dL — ABNORMAL HIGH (ref 70–99)
Potassium: 3.8 mmol/L (ref 3.5–5.1)
Sodium: 138 mmol/L (ref 135–145)

## 2018-12-08 MED ORDER — IOHEXOL 300 MG/ML  SOLN: 10.0000 mL | Freq: Once | INTRAMUSCULAR | Status: DC | PRN

## 2018-12-08 MED ORDER — AMOXICILLIN-POT CLAVULANATE 400-57 MG/5ML PO SUSR
840.0000 mg | Freq: Two times a day (BID) | ORAL | Status: DC
  Administered 2018-12-08 – 2018-12-09 (×2): 840 mg via ORAL
  Filled 2018-12-08 (×5): qty 10.5

## 2018-12-08 NOTE — Progress Notes (Signed)
During shift change it was reported that Pt's GTude is splitting on two sides at the entrance of the tube. Reported findings to Dr. Jerelyn Charles.

## 2018-12-08 NOTE — Progress Notes (Addendum)
[facility] [place] NAME: Maurice Patterson    MR#:  [id number]  DATE OF BIRTH:  August 02, 1956  SUBJECTIVE:  CHIEF COMPLAINT:   Chief Complaint  Patient presents with  . Fever    unresponsive  Patient with severe dementia at baseline-poor historian, and discussion with nursing staff-gastric tube noted for tears within the catheter, will ask IR for gastric tube replacement/repair  REVIEW OF SYSTEMS:  Review of Systems  Unable to perform ROS: Patient nonverbal    DRUG ALLERGIES:   Allergies  Allergen Reactions  . Hydrocodone Anaphylaxis  . Morphine Other (See Comments)    Loss of memory  . Quetiapine Other (See Comments)    Insomnia, agitation, anxiety  . Ambien [Zolpidem] Other (See Comments)    delirium    . Brilinta [Ticagrelor] Other (See Comments)    Stroke   . Flexeril [Cyclobenzaprine] Other (See Comments)    delerium   . Flunitrazepam Other (See Comments)    ROHYPNOL (hallucinations)  . Haldol [Haloperidol Lactate] Other (See Comments)    delerium   . Levetiracetam Diarrhea and Other (See Comments)    Unable to walk  . Lorazepam Hives  . Risperdal [Risperidone] Other (See Comments)    Delirium   . Trazodone Other (See Comments)    Delirium Can take in low doses   . Benadryl [Diphenhydramine Hcl (Sleep)] Rash  . Penicillins Rash    Mouth ulcers Has patient had a PCN reaction causing immediate rash, facial/tongue/throat swelling, SOB or lightheadedness with hypotension: Yes Has patient had a PCN reaction causing severe rash involving mucus membranes or skin necrosis: No Has patient had a PCN reaction that required hospitalization No Has patient had a PCN reaction occurring within the last 10 years: Yes If all of the above answers are "NO", then may proceed with Cephalosporin use.  . Vancomycin Rash    Rash around IV site during vancomycin infusion    VITALS:  Blood pressure 104/65, pulse 98, temperature 98.1  F (36.7 C), temperature source Axillary, resp. rate 20, height 5\' 6"  (1.676 m), weight 51 kg, SpO2 99 %.  PHYSICAL EXAMINATION:  Physical Exam   GENERAL:  63 y.o.-year-old ill appearing patient lying in the bed with no acute distress.  Cachectic/frail appearing EYES: Pupils equal, round, reactive to light and accommodation. No scleral icterus. Extraocular muscles intact.  HEENT: Head atraumatic, normocephalic. Oropharynx and nasopharynx clear.  NECK:  Supple, no jugular venous distention. No thyroid enlargement, no tenderness.  LUNGS: Moving air bilaterally, no wheezing, rales,rhonchi or crepitation. No use of accessory muscles of respiration. Decreased bibasilar breath sounds CARDIOVASCULAR: S1, S2 normal. No  rubs, or gallops. 2/6 systolic murmur present ABDOMEN: Soft, nontender, nondistended. Bowel sounds present. Has  PEG tube in place. No organomegaly or mass.  EXTREMITIES: No pedal edema, cyanosis, or clubbing.  Diffuse muscular wasting NEUROLOGIC: Patient is lethargic, when alert, tracking, not following commands or speaking  PSYCHIATRIC: The patient is lethargic.  SKIN: No obvious rash, lesion, or ulcer.    LABORATORY PANEL:   CBC Recent Labs  Lab 12/08/18 0502  WBC 12.8*  HGB 11.1*  HCT 36.2*  PLT 366   ------------------------------------------------------------------------------------------------------------------  Chemistries  Recent Labs  Lab 12/02/18 0535 12/08/18 0502  NA 142 138  K 3.6 3.8  CL 107 102  CO2 29 28  GLUCOSE 170* 111*  BUN 11 19  CREATININE <0.30* 0.36*  CALCIUM 7.7* 8.3*  MG 2.1  --    ------------------------------------------------------------------------------------------------------------------  Cardiac Enzymes No results for input(s): TROPONINI in the last 168 hours. ------------------------------------------------------------------------------------------------------------------  RADIOLOGY:  US Venous Img Upper Uni Right   Result Date: 12/06/2018 CLINICAL DATA:  Swelling EXAM: UPPER EXTREMITY VENOUS DOPPLER ULTRASOUND TECHNIQUE: Gray-scale sonography with graded compression, as well as color Doppler and duplex ultrasound were performed to evaluate the upper extremity deep venous system from the level of the subclavian vein and including the jugular, axillary, basilic and upper cephalic vein. Spectral Doppler was utilized to evaluate flow at rest and with distal augmentation maneuvers. Technologist describes technically difficult study due to overlying bandages and edema, patient confusion with lack of cooperation. COMPARISON:  None. FINDINGS: Thrombus within deep veins:  None visualized. Compressibility of deep veins:  Normal. Duplex waveform respiratory phasicity:  Normal. Duplex waveform response to augmentation:  Normal. Venous reflux:  None visualized. Other findings: Limited images of the contralateral subclavian vein unremarkable. IMPRESSION: No evidence of right upper extremity DVT. Electronically Signed   By: Lucrezia Europe M.D.   On: 12/06/2018 15:33   Dg Chest Port 1 View  Result Date: 12/06/2018 CLINICAL DATA:  Increased oxygen demand.  Recent treated pneumonia. EXAM: PORTABLE CHEST 1 VIEW COMPARISON:  12/02/2018 FINDINGS: Stable heart size. Increased opacity in the right upper and lower lung zones may relate to pneumonia versus atelectasis. Mild pulmonary interstitial and vascular prominence present which appears largely chronic. No evidence of pleural fluid or pneumothorax. Stable old right-sided rib fractures. IMPRESSION: Increased opacity in both right upper and lower lung zones may relate to residual pneumonia versus atelectasis. Mild pulmonary interstitial and vascular prominence appears largely chronic. Electronically Signed   By: Aletta Edouard M.D.   On: 12/06/2018 13:26    EKG:   Orders placed or performed during the hospital encounter of 11/24/18  . ED EKG 12-Lead  . ED EKG 12-Lead  . EKG 12-Lead  .  EKG 12-Lead  . EKG 12-Lead  . EKG 12-Lead    ASSESSMENT AND PLAN:  63 year old male with past medical history significant for rapidly progressive dementia, nonverbal at baseline, history of stroke, CAD, CKD, CHF, recent admission to [facility] for intraparenchymal left cerebral hemorrhage comes from nursing home secondary to sepsis secondary to pneumonia  *Acute hypoxic respiratory failure on admission secondary to aspiration pneumonia Stable Patient did require short stay in the ICU, extubated 11/26/2018, COVID-19 negative. Infectious disease did see patient while in house - treated with course of cefepime/Unasyn   *Acute recurrent/H CAP aspiration pneumonia Completed empiric course of antibiotics with cefepime/Unasyn Developed second episode December 07, 2018-pneumonia protocol reinitiated, started on cefepime-changed to Augmentin to complete antibiotic course  *Acute recurrent PEG tube dysfunction  Noted tears within new gastric tube Will ask IR for gastric tube replacement versus repair S/p gastric tube replacement on December 05, 2018 by radiology   *Nonverbal and rapidly progressive dementia at baseline at baseline, alert and tracking. Continue Aricept and Namenda  *Sepsis secondary to pneumonia Resolved Treated on our sepsis protocol, weaned off pressor support, treated with course of antibiotics  *Diabetes mellitus Stable on current regiment   *Hypokalemia Replaced  *Chronic pressure ulcerations Continue local wound care, wound care nurse consulted  Overall poor prognosis with recurrent long hospitalizations, palliative care following, wife wants patient to come home on Saturday, per case management/social work -the patient's wife has been refusing to accept hospital equipment at home for patient, will discharge patient to home once the wife feels she is ready  All the records are reviewed and case discussed with Care  Management/Social Workerr. Management plans discussed with  the patient, family and they are in agreement.  CODE STATUS: Full Code  TOTAL TIME TAKING CARE OF THIS PATIENT: 70minutes.   POSSIBLE D/C IN 0-10 DAYS, DEPENDING ON PATIENT'S WIFE.   Avel Peace Terez Montee M.D on 12/08/2018 at 11:20 AM  Between 7am to 6pm - Pager - [phone]  After 6pm go to [email or web address] - password [email or web address] [facility]  Office  [phone]  CC: Primary care physician; [name], MD

## 2018-12-08 NOTE — Progress Notes (Signed)
Palliative Note:  Patient nonverbal, somewhat lethargic, will not follow commands. Delay in discharge due to wife comfort level and delivery of necessary home equipment. Patient now spiking fevers and has been started on Vancomycin.   Wife preparing for patient to return home at discharge. Remains hopeful. Continues to request full aggressive medical intervention including full code.   Goals are set. She is open to outpatient palliative at discharge and not interested in considering hospice or poor prognosis. It will be important for palliative to be involved once discharge to offer support and continuous education regarding patient's condition and prognosis. She is not interested in SNF and feels once he returns home she will best know how to care for him.   Exam: No respiratory distress, minimally responsive, tachycardic, decreased breath sounds. PEG in place. Will open eyes occasionally.   Plan: -Full Code -Full aggressive medical interventions -Home with home health and outpatient palliative once stable for discharge -PMT will continue to support and follow as needed.   The above conversation was completed via telephone due to the visitor restrictions during the COVID-19 pandemic. Thorough chart review and discussion with necessary members of the care team was completed as part of assessment.   Total Time: 35 min.   Greater than 50%  of this time was spent counseling and coordinating care related to the above assessment and plan  Alda Lea, AGPCNP-BC Palliative Medicine Team  Pager: [phone] Amion: N. [place]

## 2018-12-08 NOTE — Progress Notes (Signed)
Dr. Jerelyn Charles notified that patient appears to be having periods of apnea. Patient has stable vital signs. MD acknowledged.

## 2018-12-09 DIAGNOSIS — Z9981 Dependence on supplemental oxygen: Secondary | ICD-10-CM | POA: Diagnosis not present

## 2018-12-09 DIAGNOSIS — I252 Old myocardial infarction: Secondary | ICD-10-CM | POA: Diagnosis not present

## 2018-12-09 DIAGNOSIS — E1169 Type 2 diabetes mellitus with other specified complication: Secondary | ICD-10-CM | POA: Diagnosis not present

## 2018-12-09 DIAGNOSIS — E785 Hyperlipidemia, unspecified: Secondary | ICD-10-CM | POA: Diagnosis not present

## 2018-12-09 DIAGNOSIS — Z7902 Long term (current) use of antithrombotics/antiplatelets: Secondary | ICD-10-CM | POA: Diagnosis not present

## 2018-12-09 DIAGNOSIS — Z7982 Long term (current) use of aspirin: Secondary | ICD-10-CM | POA: Diagnosis not present

## 2018-12-09 DIAGNOSIS — R627 Adult failure to thrive: Secondary | ICD-10-CM | POA: Diagnosis not present

## 2018-12-09 DIAGNOSIS — L89150 Pressure ulcer of sacral region, unstageable: Secondary | ICD-10-CM | POA: Diagnosis not present

## 2018-12-09 DIAGNOSIS — E1142 Type 2 diabetes mellitus with diabetic polyneuropathy: Secondary | ICD-10-CM | POA: Diagnosis not present

## 2018-12-09 DIAGNOSIS — M1990 Unspecified osteoarthritis, unspecified site: Secondary | ICD-10-CM | POA: Diagnosis not present

## 2018-12-09 DIAGNOSIS — I509 Heart failure, unspecified: Secondary | ICD-10-CM | POA: Diagnosis not present

## 2018-12-09 DIAGNOSIS — K861 Other chronic pancreatitis: Secondary | ICD-10-CM | POA: Diagnosis not present

## 2018-12-09 DIAGNOSIS — Z4800 Encounter for change or removal of nonsurgical wound dressing: Secondary | ICD-10-CM | POA: Diagnosis not present

## 2018-12-09 DIAGNOSIS — Z794 Long term (current) use of insulin: Secondary | ICD-10-CM | POA: Diagnosis not present

## 2018-12-09 DIAGNOSIS — I2510 Atherosclerotic heart disease of native coronary artery without angina pectoris: Secondary | ICD-10-CM | POA: Diagnosis not present

## 2018-12-09 DIAGNOSIS — M519 Unspecified thoracic, thoracolumbar and lumbosacral intervertebral disc disorder: Secondary | ICD-10-CM | POA: Diagnosis not present

## 2018-12-09 DIAGNOSIS — Z8673 Personal history of transient ischemic attack (TIA), and cerebral infarction without residual deficits: Secondary | ICD-10-CM | POA: Diagnosis not present

## 2018-12-09 DIAGNOSIS — G40909 Epilepsy, unspecified, not intractable, without status epilepticus: Secondary | ICD-10-CM | POA: Diagnosis not present

## 2018-12-09 DIAGNOSIS — E43 Unspecified severe protein-calorie malnutrition: Secondary | ICD-10-CM | POA: Diagnosis not present

## 2018-12-09 DIAGNOSIS — Z451 Encounter for adjustment and management of infusion pump: Secondary | ICD-10-CM | POA: Diagnosis not present

## 2018-12-09 DIAGNOSIS — I152 Hypertension secondary to endocrine disorders: Secondary | ICD-10-CM | POA: Diagnosis not present

## 2018-12-09 DIAGNOSIS — K219 Gastro-esophageal reflux disease without esophagitis: Secondary | ICD-10-CM | POA: Diagnosis not present

## 2018-12-09 DIAGNOSIS — R413 Other amnesia: Secondary | ICD-10-CM | POA: Diagnosis not present

## 2018-12-09 DIAGNOSIS — Z434 Encounter for attention to other artificial openings of digestive tract: Secondary | ICD-10-CM | POA: Diagnosis not present

## 2018-12-09 DIAGNOSIS — K7581 Nonalcoholic steatohepatitis (NASH): Secondary | ICD-10-CM | POA: Diagnosis not present

## 2018-12-09 LAB — GLUCOSE, CAPILLARY
Glucose-Capillary: 108 mg/dL — ABNORMAL HIGH (ref 70–99)
Glucose-Capillary: 129 mg/dL — ABNORMAL HIGH (ref 70–99)
Glucose-Capillary: 154 mg/dL — ABNORMAL HIGH (ref 70–99)
Glucose-Capillary: 191 mg/dL — ABNORMAL HIGH (ref 70–99)

## 2018-12-09 LAB — CBC
HCT: 37.9 % — ABNORMAL LOW (ref 39.0–52.0)
Hemoglobin: 11.3 g/dL — ABNORMAL LOW (ref 13.0–17.0)
MCH: 27.8 pg (ref 26.0–34.0)
MCHC: 29.8 g/dL — ABNORMAL LOW (ref 30.0–36.0)
MCV: 93.1 fL (ref 80.0–100.0)
Platelets: 364 10*3/uL (ref 150–400)
RBC: 4.07 MIL/uL — ABNORMAL LOW (ref 4.22–5.81)
RDW: 17.3 % — ABNORMAL HIGH (ref 11.5–15.5)
WBC: 11 10*3/uL — ABNORMAL HIGH (ref 4.0–10.5)
nRBC: 0 % (ref 0.0–0.2)

## 2018-12-09 MED ORDER — AMOXICILLIN-POT CLAVULANATE 400-57 MG/5ML PO SUSR: 840.0000 mg | Freq: Two times a day (BID) | ORAL | 0 refills | Status: AC

## 2018-12-09 NOTE — TOC Transition Note (Signed)
Transition of Care [facility]) - CM/SW Discharge Note   Patient Details  Name: Maurice Patterson MRN: [id number] Date of Birth: Mar 24, 1956  Transition of Care [facility]) CM/SW Contact:  Latanya Maudlin, RN Phone Number: 12/09/2018, 4:41 PM   Clinical Narrative:   Patient to be discharged per MD order. Orders in place for home health services. Patient will be receiving tube feeds, O2, etc in the home. Per wife Cecille Rubin everything was delivered Thursday 4/16. She voices frustration with the amount of equipment that was brought out as well as the lack of storage she has for it. Notified Corene Cornea from [facility] of pending discharge. EMS for transport. [name] called RNCM and was quite hysterical. She made verbal threats towards staff, constantly cursed and threatened to sue over "all of this". RNCM talked with Cecille Rubin several times over the duration of about 3 hours. She is upset with many things but mainly that there is not a nurse to meet [name] at the house. Her expectation was that a Therapist, sports from [facility] would be at her home at 12 noon and [name] would also arrive at home noon time. Corene Cornea from [facility] had been unsuccessful in contacting [place] for scheduling a time previously. Corene Cornea was able to contact [place] today and schedule a home visit for tomorrow. Cecille Rubin was upset and threating to come to the hospital to "show her butt". After many conversations and deliberations [facility] will be able to staff an RN tonight at 18:30. We will discharge patient via EMS and have them meet the patient in the home.      Final next level of care: [place] Barriers to Discharge: No Barriers Identified   Patient Goals and CMS Choice Patient states their goals for this hospitalization and ongoing recovery are:: Cecille Rubin, wife would like patient to DC to home with her when medically ready CMS Medicare.gov Compare Post Acute Care list provided to:: Patient Choice offered to / list presented to : Spouse  Discharge Placement                       Discharge Plan and Services In-house Referral: [facility] Discharge Planning Services: CM Consult Post Acute Care Choice: Home Health, Durable Medical Equipment          DME Arranged: Oxygen DME Agency: [facility] HH Arranged: RN, PT, OT, Nurse's Aide [place] Agency: [place] ([facility])   Social Determinants of Health (SDOH) Interventions     Readmission Risk Interventions Readmission Risk Prevention Plan 11/29/2018  Transportation Screening Complete  Medication Review Press photographer) Complete  PCP or Specialist appointment within 3-5 days of discharge Complete  HRI or [place] Complete  SW Recovery Care/Counseling Consult Complete  [place] Not Applicable  Some recent data might be hidden

## 2018-12-09 NOTE — Progress Notes (Signed)
[facility] at [place] NAME: Maurice Patterson    MR#:  [id number]  DATE OF BIRTH:  April 29, 1956  SUBJECTIVE:   She is nonverbal at baseline, no acute events overnight.  Tolerating his tube feeds.  PEG tube was changed by IR yesterday.  REVIEW OF SYSTEMS:  Review of Systems  Unable to perform ROS: Patient nonverbal    DRUG ALLERGIES:   Allergies  Allergen Reactions   Hydrocodone Anaphylaxis   Morphine Other (See Comments)    Loss of memory   Quetiapine Other (See Comments)    Insomnia, agitation, anxiety   Ambien [Zolpidem] Other (See Comments)    delirium     Brilinta [Ticagrelor] Other (See Comments)    Stroke    Flexeril [Cyclobenzaprine] Other (See Comments)    delerium    Flunitrazepam Other (See Comments)    ROHYPNOL (hallucinations)   Haldol [Haloperidol Lactate] Other (See Comments)    delerium    Levetiracetam Diarrhea and Other (See Comments)    Unable to walk   Lorazepam Hives   Risperdal [Risperidone] Other (See Comments)    Delirium    Trazodone Other (See Comments)    Delirium Can take in low doses    Benadryl [Diphenhydramine Hcl (Sleep)] Rash   Penicillins Rash    Mouth ulcers Has patient had a PCN reaction causing immediate rash, facial/tongue/throat swelling, SOB or lightheadedness with hypotension: Yes Has patient had a PCN reaction causing severe rash involving mucus membranes or skin necrosis: No Has patient had a PCN reaction that required hospitalization No Has patient had a PCN reaction occurring within the last 10 years: Yes If all of the above answers are "NO", then may proceed with Cephalosporin use.   Vancomycin Rash    Rash around IV site during vancomycin infusion    VITALS:  Blood pressure (!) 105/50, pulse 92, temperature 97.8 F (36.6 C), temperature source Oral, resp. rate 15, height 5\' 6"  (1.676 m), weight 51.3 kg, SpO2 100 %.  PHYSICAL EXAMINATION:  Physical  Exam   GENERAL:  63 y.o.-year-old ill appearing patient lying in the bed with no acute distress.  Cachectic/frail appearing EYES: Pupils equal, round, reactive to light and accommodation. No scleral icterus. Extraocular muscles intact.  HEENT: Head atraumatic, normocephalic. Oropharynx and nasopharynx clear.  NECK:  Supple, no jugular venous distention. No thyroid enlargement, no tenderness.  LUNGS: Moving air bilaterally, no wheezing, rales,rhonchi or crepitation. No use of accessory muscles of respiration. Decreased bibasilar breath sounds CARDIOVASCULAR: S1, S2 normal. No  rubs, or gallops. 2/6 systolic murmur present ABDOMEN: Soft, nontender, nondistended. Bowel sounds present. Has  PEG tube in place. No organomegaly or mass.  EXTREMITIES: No pedal edema, cyanosis, or clubbing.  Diffuse muscular wasting NEUROLOGIC: Alert awake oriented x1.  Encephalopathic.  Contracted.  No other co-motor or sensory deficits appreciated bilaterally. PSYCHIATRIC: The patient is lethargic.  SKIN: No obvious rash, lesion, or ulcer.    LABORATORY PANEL:   CBC Recent Labs  Lab 12/08/18 0502  WBC 12.8*  HGB 11.1*  HCT 36.2*  PLT 366   ------------------------------------------------------------------------------------------------------------------  Chemistries  Recent Labs  Lab 12/08/18 0502  NA 138  K 3.8  CL 102  CO2 28  GLUCOSE 111*  BUN 19  CREATININE 0.36*  CALCIUM 8.3*   ------------------------------------------------------------------------------------------------------------------  Cardiac Enzymes No results for input(s): TROPONINI in the last 168 hours. ------------------------------------------------------------------------------------------------------------------  RADIOLOGY:  Dg Replc Gastro/jejuno Tube Percut W/fluoro  Result Date: 12/08/2018 INDICATION: Gastrostomy tube  placed on 12/05/2018 has become damaged. EXAM: GASTROSTOMY TUBE EXCHANGE UNDER FLUOROSCOPY  MEDICATIONS: None ANESTHESIA/SEDATION: None CONTRAST:  10 mL Omnipaque 300-administered into the gastric lumen. FLUOROSCOPY TIME:  Fluoroscopy Time: 6 seconds.  0.7 mGy. COMPLICATIONS: None immediate. PROCEDURE: The pre-existing gastrostomy tube was inspected. The retention balloon was deflated and the tube removed. A new 52 French balloon retention gastrostomy tube was advanced through the tract and into the stomach. The retention balloon was inflated with 8 mL of saline. The tube was injected with contrast material and a fluoroscopic spot image saved. The tube was then flushed. A Lopez feeding valve was attached to the end of the gastrostomy tube. FINDINGS: The pre-existing tube is cracked at its feeding hub likely due to forceful manipulation during connection and disconnection of feeding pump. A new tube was placed and lies in the body of the stomach which is confirmed by fluoroscopy and contrast injection. A Lopez valve was attached to the tube in order to try to prevent future damage to the tube with manipulation. IMPRESSION: Replacement of damaged 18 French balloon retention gastrostomy tube with new catheter. The tip of this catheter lies in the body of the stomach and the tube is ready for immediate use. A Lopez valve was attached to the tube in order to try to prevent future damage to the tube. Electronically Signed   By: Aletta Edouard M.D.   On: 12/08/2018 14:33    EKG:   Orders placed or performed during the hospital encounter of 11/24/18   ED EKG 12-Lead   ED EKG 12-Lead   EKG 12-Lead   EKG 12-Lead   EKG 12-Lead   EKG 12-Lead    ASSESSMENT AND PLAN:  63 year old male with past medical history significant for rapidly progressive dementia, nonverbal at baseline, history of stroke, CAD, CKD, CHF, recent admission to [facility] for intraparenchymal left cerebral hemorrhage comes from nursing home secondary to sepsis secondary to pneumonia  *Acute hypoxic respiratory failure on  admission secondary to aspiration pneumonia Stable Patient did require short stay in the ICU, extubated 11/26/2018, COVID-19 negative. Infectious disease did see patient while in house - treated with course of cefepime/Unasyn and now on Augmentin and will finish course.   *Acute recurrent/H CAP aspiration pneumonia Completed empiric course of antibiotics with cefepime/Unasyn Developed second episode December 07, 2018-pneumonia protocol reinitiated, started on cefepime-changed to Augmentin to complete antibiotic course  *Acute recurrent PEG tube dysfunction  -IR has replaced patient's PEG tube as of yesterday.  It is functioning fine patient is tolerating his tube feeds.  *Nonverbal and rapidly progressive dementia at baseline at baseline, alert and tracking. Continue Aricept and Namenda  *Sepsis secondary to pneumonia Resolved Treated on our sepsis protocol, weaned off pressor support, treated with course of antibiotics  *Diabetes mellitus Stable on current regiment   *Hypokalemia Replaced  *Chronic pressure ulcerations Continue local wound care, wound care nurse consulted  Overall poor prognosis with recurrent long hospitalizations, palliative care following, wife wants patient to come home and will plan on discharge home with Home Health today if possible.   All the records are reviewed and case discussed with Care Management/Social Workerr. Management plans discussed with the patient, family and they are in agreement.  CODE STATUS: Full Code  TOTAL TIME TAKING CARE OF THIS PATIENT: 30 minutes.   POSSIBLE D/C today or tomorrow, DEPENDING ON PATIENT'S WIFE.   Henreitta Leber M.D on 12/09/2018 at 2:55 PM  Between 7am to 6pm - Pager - [phone]  After  6pm go to [email or web address] - password [email or web address] [facility]  Office  [phone]  CC: Primary care physician; [name], MD

## 2018-12-10 LAB — BLOOD GAS, ARTERIAL
Acid-Base Excess: 5.5 mmol/L — ABNORMAL HIGH (ref 0.0–2.0)
Bicarbonate: 28.9 mmol/L — ABNORMAL HIGH (ref 20.0–28.0)
FIO2: 0.4
O2 Saturation: 93.7 %
Patient temperature: 37
pCO2 arterial: 37 mmHg (ref 32.0–48.0)
pH, Arterial: 7.5 — ABNORMAL HIGH (ref 7.350–7.450)
pO2, Arterial: 63 mmHg — ABNORMAL LOW (ref 83.0–108.0)

## 2018-12-11 ENCOUNTER — Telehealth: Payer: Self-pay

## 2018-12-11 DIAGNOSIS — J690 Pneumonitis due to inhalation of food and vomit: Secondary | ICD-10-CM | POA: Diagnosis not present

## 2018-12-11 DIAGNOSIS — R627 Adult failure to thrive: Secondary | ICD-10-CM | POA: Diagnosis not present

## 2018-12-11 DIAGNOSIS — K859 Acute pancreatitis without necrosis or infection, unspecified: Secondary | ICD-10-CM | POA: Diagnosis not present

## 2018-12-11 DIAGNOSIS — E119 Type 2 diabetes mellitus without complications: Secondary | ICD-10-CM | POA: Diagnosis not present

## 2018-12-11 DIAGNOSIS — I219 Acute myocardial infarction, unspecified: Secondary | ICD-10-CM | POA: Diagnosis not present

## 2018-12-11 NOTE — Telephone Encounter (Signed)
This RNCM received notification from weekend [facility] with request to follow up with patient's wife Maurice Patterson around numerous concerns she expressed on his voicemail.  This RNCM has talked with wife Maurice Patterson and made arrangements with [facility] regarding hospital bed and formula; [facility] home health regarding wound care, saline flush for wound, process of formula administration.  Follow up appointment with PCP Dr. Astrid Divine on 12/20/18 at [place] and wife agrees to call EMS 24 hours in advance.

## 2018-12-11 NOTE — Discharge Summary (Signed)
[facility] NAME: Maurice Patterson    MR#:  [id number]  DATE OF BIRTH:  06-Jun-1956  DATE OF ADMISSION:  11/24/2018 ADMITTING PHYSICIAN: Saundra Shelling, MD  DATE OF DISCHARGE: 12/09/2018  5:19 PM  PRIMARY CARE PHYSICIAN: [name], MD    ADMISSION DIAGNOSIS:  Acute respiratory failure with hypoxia ([place]) [J96.01] Sepsis with acute hypoxic respiratory failure without septic shock, due to unspecified organism ([place]) [A41.9, R65.20, J96.01] Acute respiratory failure ([place]) [J96.00]  DISCHARGE DIAGNOSIS:  Active Problems:   Acute respiratory failure ([place])   SECONDARY DIAGNOSIS:   Past Medical History:  Diagnosis Date  . Acute MI ([place])   . Arthritis   . Back pain   . CAD (coronary artery disease)   . CHF (congestive heart failure) ([place])   . Chronic kidney disease   . Degenerative lumbar disc   . Dementia ([place])   . Diabetes mellitus without complication ([place])   . GERD (gastroesophageal reflux disease)   . Hernia of abdominal cavity   . Hyperlipemia   . Hypertension   . Malignant intraductal papillary mucinous tumor of pancreas ([place])   . Memory loss   . Pancreatitis   . Seizures ([place])    staring spells  . Shingles   . Stroke ([place]) 08/2017  . TIA (transient ischemic attack)     HOSPITAL COURSE:   63 year old male with past medical history significant for rapidly progressive dementia, nonverbal at baseline, history of stroke, CAD, CKD, CHF, recent admission to [facility] for intraparenchymal left cerebral hemorrhage comes from nursing home secondary to sepsis secondary to pneumonia  *Acute hypoxic respiratory failure on admission secondary to aspiration pneumonia -Patient initially admitted to the intensive care unit was intubated and shortly thereafter extubated on Dec 26, 2018.  Patient's COVID-19 test was negative. - Seen by infectious disease and thought this was aspiration pneumonia patient treated with IV Unasyn and  cefepime and now being discharged on oral Augmentin for additional few days.  *Acute recurrent/H CAP aspiration pneumonia Completed empiric course of antibiotics with cefepime/Unasyn Developed second episode December 07, 2018 was treated with IV Unasyn and now being discharged on oral Augmentin.  *Acute recurrent PEG tube dysfunction  -IR has replaced patient's PEG tube as of yesterday.  It is functioning fine patient is tolerating his tube feeds and meds well.   *Nonverbal and rapidly progressive dementia at baseline at baseline, alert and tracking.  - Continue Aricept and Namenda.  Pt's Prognosis is quite poor.   *Sepsis secondary to pneumonia Resolved Treated on our sepsis protocol, weaned off pressor support, treated with course of antibiotics as mentioned above.   *Diabetes mellitus Stable on current regiment   *Hypokalemia Replaced and stalbe.   *Chronic pressure ulcerations Continue local wound care and will have home health arranged for dressing changes.   Has a very poor prognosis given his recurrent long hospitalizations.  Palliative care consult was done to discuss goals of care with the patient's wife.  Patient remains a full code for now.  Patient to be discharged home with home health services.  DISCHARGE CONDITIONS:   Stable  CONSULTS OBTAINED:    DRUG ALLERGIES:   Allergies  Allergen Reactions  . Hydrocodone Anaphylaxis  . Morphine Other (See Comments)    Loss of memory  . Quetiapine Other (See Comments)    Insomnia, agitation, anxiety  . Ambien [Zolpidem] Other (See Comments)    delirium    . Brilinta [Ticagrelor] Other (See Comments)  Stroke   . Flexeril [Cyclobenzaprine] Other (See Comments)    delerium   . Flunitrazepam Other (See Comments)    ROHYPNOL (hallucinations)  . Haldol [Haloperidol Lactate] Other (See Comments)    delerium   . Levetiracetam Diarrhea and Other (See Comments)    Unable to walk  . Lorazepam Hives  .  Risperdal [Risperidone] Other (See Comments)    Delirium   . Trazodone Other (See Comments)    Delirium Can take in low doses   . Benadryl [Diphenhydramine Hcl (Sleep)] Rash  . Penicillins Rash    Mouth ulcers Has patient had a PCN reaction causing immediate rash, facial/tongue/throat swelling, SOB or lightheadedness with hypotension: Yes Has patient had a PCN reaction causing severe rash involving mucus membranes or skin necrosis: No Has patient had a PCN reaction that required hospitalization No Has patient had a PCN reaction occurring within the last 10 years: Yes If all of the above answers are "NO", then may proceed with Cephalosporin use.  . Vancomycin Rash    Rash around IV site during vancomycin infusion    DISCHARGE MEDICATIONS:   Allergies as of 12/09/2018      Reactions   Hydrocodone Anaphylaxis   Morphine Other (See Comments)   Loss of memory   Quetiapine Other (See Comments)   Insomnia, agitation, anxiety   Ambien [zolpidem] Other (See Comments)   delirium    Brilinta [ticagrelor] Other (See Comments)   Stroke   Flexeril [cyclobenzaprine] Other (See Comments)   delerium   Flunitrazepam Other (See Comments)   ROHYPNOL (hallucinations)   Haldol [haloperidol Lactate] Other (See Comments)   delerium   Levetiracetam Diarrhea, Other (See Comments)   Unable to walk   Lorazepam Hives   Risperdal [risperidone] Other (See Comments)   Delirium   Trazodone Other (See Comments)   Delirium Can take in low doses    Benadryl [diphenhydramine Hcl (sleep)] Rash   Penicillins Rash   Mouth ulcers Has patient had a PCN reaction causing immediate rash, facial/tongue/throat swelling, SOB or lightheadedness with hypotension: Yes Has patient had a PCN reaction causing severe rash involving mucus membranes or skin necrosis: No Has patient had a PCN reaction that required hospitalization No Has patient had a PCN reaction occurring within the last 10 years: Yes If all of the above  answers are "NO", then may proceed with Cephalosporin use.   Vancomycin Rash   Rash around IV site during vancomycin infusion      Medication List    STOP taking these medications   gabapentin 100 MG capsule Commonly known as:  NEURONTIN   Melatonin 3 MG Tabs     TAKE these medications   acetaminophen 500 MG tablet Commonly known as:  TYLENOL Place 1 tablet (500 mg total) into feeding tube every 6 (six) hours as needed for mild pain.   amoxicillin-clavulanate 400-57 MG/5ML suspension Commonly known as:  AUGMENTIN Take 10.5 mLs (840 mg total) by mouth 2 (two) times daily for 7 days.   ascorbic acid 250 MG tablet Commonly known as:  VITAMIN C Place 1 tablet (250 mg total) into feeding tube 2 (two) times daily. What changed:    how to take this  when to take this   aspirin 81 MG chewable tablet Place 1 tablet (81 mg total) into feeding tube daily.   atorvastatin 80 MG tablet Commonly known as:  LIPITOR Place 1 tablet (80 mg total) into feeding tube daily. What changed:  how to take this  bisacodyl 10 MG suppository Commonly known as:  DULCOLAX Place 1 suppository (10 mg total) rectally daily for 10 days.   chlorhexidine gluconate (MEDLINE KIT) 0.12 % solution Commonly known as:  PERIDEX 15 mLs by Mouth Rinse route 2 (two) times daily.   cholecalciferol 10 MCG/ML Liqd Commonly known as:  D-VI-SOL Place 2.5 mLs (1,000 Units total) into feeding tube daily. What changed:  Another medication with the same name was added. Make sure you understand how and when to take each.   cholecalciferol 10 MCG/ML Liqd Commonly known as:  D-VI-SOL Place 2.5 mLs (1,000 Units total) into feeding tube daily. What changed:  You were already taking a medication with the same name, and this prescription was added. Make sure you understand how and when to take each.   clopidogrel 75 MG tablet Commonly known as:  PLAVIX Place 1 tablet (75 mg total) into feeding tube daily.    collagenase ointment Commonly known as:  SANTYL Apply topically daily.   donepezil 10 MG tablet Commonly known as:  ARICEPT Place 1 tablet (10 mg total) into feeding tube 2 (two) times daily.   doxazosin 2 MG tablet Commonly known as:  CARDURA Take 1 tablet (2 mg total) by mouth Nightly.   feeding supplement (GLUCERNA 1.5 CAL) Liqd Place 1,000 mLs into feeding tube continuous.   folic acid 1 MG tablet Commonly known as:  FOLVITE Take 1 tablet (1 mg total) by mouth daily.   free water Soln Place 75 mLs into feeding tube every 4 (four) hours. What changed:  how much to take   furosemide 40 MG tablet Commonly known as:  LASIX Place 1 tablet (40 mg total) into feeding tube daily. What changed:  how to take this   Hydrogel Gel Apply 1 application topically daily.   insulin aspart 100 UNIT/ML injection Commonly known as:  novoLOG Inject 3 Units into the skin every 6 (six) hours.   insulin glargine 100 UNIT/ML injection Commonly known as:  Lantus Inject 0.15 mLs (15 Units total) into the skin daily. What changed:  how much to take   isosorbide mononitrate 30 MG 24 hr tablet Commonly known as:  IMDUR Take 1 tablet (30 mg total) by mouth 2 (two) times daily. Per pegtube What changed:  additional instructions   lacosamide 50 MG Tabs tablet Commonly known as:  VIMPAT Place 2 tablets (100 mg total) into feeding tube 2 (two) times daily. What changed:  how to take this   lidocaine 5 % Commonly known as:  LIDODERM Place 1 patch onto the skin daily. Remove & Discard patch within 12 hours or as directed by MD   loperamide 2 MG capsule Commonly known as:  IMODIUM Take 1 capsule (2 mg total) by mouth as needed for diarrhea or loose stools.   losartan 100 MG tablet Commonly known as:  COZAAR Take 1 tablet (100 mg total) by mouth daily.   memantine 10 MG tablet Commonly known as:  NAMENDA Place 1 tablet (10 mg total) into feeding tube 2 (two) times daily.    metoCLOPramide 5 MG tablet Commonly known as:  REGLAN Place 1 tablet (5 mg total) into feeding tube 4 (four) times daily -  before meals and at bedtime.   metoprolol tartrate 25 MG tablet Commonly known as:  LOPRESSOR Place 1 tablet (25 mg total) into feeding tube 2 (two) times daily.   mouth rinse Liqd solution 15 mLs by Mouth Rinse route 2 (two) times daily as needed.  multivitamin Liqd Take 30 mLs by mouth daily.   nystatin 100000 UNIT/ML suspension Commonly known as:  MYCOSTATIN Take 5 mLs (500,000 Units total) by mouth 4 (four) times daily.   pantoprazole sodium 40 mg/20 mL Pack Commonly known as:  PROTONIX Place 20 mLs (40 mg total) into feeding tube daily.   potassium chloride 20 MEQ packet Commonly known as:  KLOR-CON Place 20 mEq into feeding tube daily. What changed:  when to take this   senna-docusate 8.6-50 MG tablet Commonly known as:  Senokot-S Place 2 tablets into feeding tube Nightly.   sodium chloride 0.9 % infusion Inject 500 mLs into the vein daily. Please infuse over 2 hours         DISCHARGE INSTRUCTIONS:   DIET:  NPO on tube feeds  DISCHARGE CONDITION:  Stable  ACTIVITY:  Activity as tolerated  OXYGEN:  Home Oxygen: No.   Oxygen Delivery: room air  DISCHARGE LOCATION:  Home with Home health RN, Aide, Social Work.    If you experience worsening of your admission symptoms, develop shortness of breath, life threatening emergency, suicidal or homicidal thoughts you must seek medical attention immediately by calling 911 or calling your MD immediately  if symptoms less severe.  You Must read complete instructions/literature along with all the possible adverse reactions/side effects for all the Medicines you take and that have been prescribed to you. Take any new Medicines after you have completely understood and accpet all the possible adverse reactions/side effects.   Please note  You were cared for by a hospitalist during your  hospital stay. If you have any questions about your discharge medications or the care you received while you were in the hospital after you are discharged, you can call the unit and asked to speak with the hospitalist on call if the hospitalist that took care of you is not available. Once you are discharged, your primary care physician will handle any further medical issues. Please note that NO REFILLS for any discharge medications will be authorized once you are discharged, as it is imperative that you return to your primary care physician (or establish a relationship with a primary care physician if you do not have one) for your aftercare needs so that they can reassess your need for medications and monitor your lab values.   DATA REVIEW:   CBC Recent Labs  Lab 12/09/18 0944  WBC 11.0*  HGB 11.3*  HCT 37.9*  PLT 364    Chemistries  Recent Labs  Lab 12/08/18 0502  NA 138  K 3.8  CL 102  CO2 28  GLUCOSE 111*  BUN 19  CREATININE 0.36*  CALCIUM 8.3*    Cardiac Enzymes No results for input(s): TROPONINI in the last 168 hours.  Microbiology Results  Results for orders placed or performed during the hospital encounter of 11/24/18  Blood Culture (routine x 2)     Status: None   Collection Time: 11/24/18  1:32 PM  Result Value Ref Range Status   Specimen Description BLOOD BLOOD RIGHT FOREARM  Final   Special Requests   Final    BOTTLES DRAWN AEROBIC AND ANAEROBIC Blood Culture adequate volume   Culture   Final    NO GROWTH 5 DAYS Performed at [facility], [address] [place], [place] [address]    Report Status 11/29/2018 FINAL  Final  Blood Culture (routine x 2)     Status: None   Collection Time: 11/24/18  1:50 PM  Result Value Ref  Range Status   Specimen Description BLOOD BLOOD LEFT FOREARM  Final   Special Requests   Final    BOTTLES DRAWN AEROBIC AND ANAEROBIC Blood Culture results may not be optimal due to an excessive volume of blood received in culture  bottles   Culture   Final    NO GROWTH 5 DAYS Performed at [facility], [address] [place], [place] [address]    Report Status 11/29/2018 FINAL  Final  Novel Coronavirus, NAA (hospital order; send-out to ref lab)     Status: None   Collection Time: 11/24/18  2:42 PM  Result Value Ref Range Status   SARS-CoV-2, NAA NOT DETECTED NOT DETECTED Final    Comment: Performed at [facility] Performed at [facility], [place] [address] [place], [place] [address]    Coronavirus Source NASOPHARYNGEAL  Final    Comment: Performed at [facility], [address] [place], [place] [address]  MRSA PCR Screening     Status: None   Collection Time: 11/24/18  4:31 PM  Result Value Ref Range Status   MRSA by PCR NEGATIVE NEGATIVE Final    Comment:        The GeneXpert MRSA Assay (FDA approved for NASAL specimens only), is one component of a comprehensive MRSA colonization surveillance program. It is not intended to diagnose MRSA infection nor to guide or monitor treatment for MRSA infections. Performed at [facility], [address] [place], [name] [address]   CULTURE, BLOOD (ROUTINE X 2) w Reflex to ID Panel     Status: None (Preliminary result)   Collection Time: 12/07/18  8:46 AM  Result Value Ref Range Status   Specimen Description BLOOD RIGHT WRIST  Final   Special Requests   Final    BOTTLES DRAWN AEROBIC AND ANAEROBIC Blood Culture adequate volume   Culture   Final    NO GROWTH 4 DAYS Performed at [facility], [address], [place], [place] [address]    Report Status PENDING  Incomplete  CULTURE, BLOOD (ROUTINE X 2) w Reflex to ID Panel     Status: None (Preliminary result)   Collection Time: 12/07/18  8:47 AM  Result Value Ref Range Status   Specimen Description BLOOD BLOOD LEFT HAND  Final   Special Requests   Final    BOTTLES DRAWN AEROBIC AND ANAEROBIC Blood Culture adequate volume   Culture   Final    NO GROWTH 4  DAYS Performed at [facility], [address] [place], [place] [address]    Report Status PENDING  Incomplete    RADIOLOGY:  No results found.    Management plans discussed with the patient, family and they are in agreement.  CODE STATUS:  Code Status History    Date Active Date Inactive Code Status Order ID Comments User Context   11/24/2018 1639 12/09/2018 2108 Full Code [id number]  Arta Silence, MD Inpatient    TOTAL TIME TAKING CARE OF THIS PATIENT: 45 minutes.    Henreitta Leber M.D on 12/11/2018 at 2:46 PM  Between 7am to 6pm - Pager - [phone]  After 6pm go to [email or web address] - Proofreader  [facility]  Office  [phone]  CC: Primary care physician; [name], MD

## 2018-12-12 ENCOUNTER — Telehealth: Payer: Self-pay

## 2018-12-12 LAB — CULTURE, BLOOD (ROUTINE X 2)
Culture: NO GROWTH
Culture: NO GROWTH
Special Requests: ADEQUATE
Special Requests: ADEQUATE

## 2018-12-12 NOTE — Telephone Encounter (Signed)
Patient contacted for Palliative Care visit.  Due to the current COVID-19 infection/crises, the patient and family prefer, and have given their verbal consent for, a provider visit via telemedicine. HIPPA policies of confidentially were discussed and patient/family expressed understanding. Visit scheduled for 12/15/2018

## 2018-12-12 NOTE — Progress Notes (Signed)
Patient was just recently discharged by Dr. Verdell Carmine on 12/09/2018.  Case manager Ms Anderson Malta reports  patient's wife claims that discharging physician forgot to give them a prescription for hiccups. CM  has requested me to call in a prescription for hiccups , she thought patient was discharged by Dr. Jerelyn Charles who is not working today.  I have called in a prescription for Zofran to [facility].  I do not feel comfortable to write Thorazine .  Patient should call his primary care physician if this med is not helping

## 2018-12-15 ENCOUNTER — Other Ambulatory Visit: Payer: PPO | Admitting: Primary Care

## 2018-12-15 ENCOUNTER — Other Ambulatory Visit: Payer: Self-pay

## 2018-12-15 DIAGNOSIS — Z515 Encounter for palliative care: Secondary | ICD-10-CM | POA: Diagnosis not present

## 2018-12-15 NOTE — Progress Notes (Signed)
[facility] Palliative Care Consult Note Telephone: [phone]  Fax: [phone]  TELEHEALTH VISIT STATEMENT Due to the COVID-19 crisis, this visit was done via telemedicine from my office. It was initiated and consented to by this patient and/or family.  PATIENT NAME: Maurice Patterson DOB: 02-09-1956 MRN: [id number]  PRIMARY CARE PROVIDER:   [name], MD  REFERRING PROVIDER:  [name], MD [address] [place], [place] [address]  RESPONSIBLE PARTY:   Extended Emergency Contact Information Primary Emergency Contact: [facility] Address: [address]          [place], [place] [address] Johnnette Litter of [place] Phone: [phone] Work Phone: [phone] Relation: Spouse Secondary Emergency Contact: Pollock,Kimberly Address: [address]          [place], [place] [address] Johnnette Litter of [place] Phone: [phone] Mobile Phone: [phone] Relation: Daughter  Telemedicine appt with Patient, wife Tonny Branch and Daughter Maudie Mercury.  This is the initial palliative medicine consultation, by request of Dr. Astrid Divine.  ASSESSMENT and RECOMMENDATIONS:   1. Goals of Care: POA Wife Cecille Rubin state desire for full interventions. Discussed long hospital course from this past fall, winter and spring, with many ED visits and respiratory illnesses with resultant debility and decline. Patient present on call but could not contribute. Wife states she wants him to get better, gain weight, and she cannot give up on him. She states she does have a DNR form in her possession to use when she feels it's time. She said she did not want me to upload DNR advance directives into [facility].  2. Hiccups: Violent hiccups, recommend thorazine for control as wife states that has helped in the past.   3. Nutrition: On feeding currently glucerna 1.5 cal, 5 cans  in a 24 hour period.  Not currently on creon. Has been on Creon in the past. Wife asks for Zenpep? I am not  familiar with this medication. We also discussed danger of occlusion from using time- released beads in the tube.  4. Pain: Currently on acetaminophen. I feel quality of life would be improved with around the clock pain management given the sacral injury. Has many intolerances and allergies to pain relievers.Wife states he cannot take narcotic pain medicine due to poor immune system.  Has had tramadol but could not tolerate, can't take gabapentin as he became agitated and confused.   5. Debility: PPS of 20 %. Family has goals to pursue all care. Will continue to follow for clarification of goals of care and symptom management.   Return 1 week, appt made for  Thursday 4/30 at 1130 am.I spent 60 minutes providing this consultation,  from 11:00 to 12:00. More than 50% of the time in this consultation was spent coordinating communication.   HISTORY OF PRESENT ILLNESS:  Maurice Patterson is a 63 y.o. year old male with multiple medical problems including CAD, dementia, severe protein calorie malnutrition,seizures, stroke, DM2, pancreatitis,CHF. Palliative Care was asked to help address goals of care.   CODE STATUS: FULL CODE, States she has a copy of the DNR to use at her discretion but does not want this invoked at this time.  PPS: 20% HOSPICE ELIGIBILITY/DIAGNOSIS: TBD  PAST MEDICAL HISTORY:  Past Medical History:  Diagnosis Date  . Acute MI ([place])   . Arthritis   . Back pain   . CAD (coronary artery disease)   . CHF (congestive heart failure) ([facility])   . Chronic kidney disease   . Degenerative  lumbar disc   . Dementia ([place])   . Diabetes mellitus without complication ([place])   . GERD (gastroesophageal reflux disease)   . Hernia of abdominal cavity   . Hyperlipemia   . Hypertension   . Malignant intraductal papillary mucinous tumor of pancreas ([place])   . Memory loss   . Pancreatitis   . Seizures ([place])    staring spells  . Shingles   . Stroke ([place]) 08/2017  . TIA (transient ischemic attack)      SOCIAL HX:  Social History   Tobacco Use  . Smoking status: Former Smoker    Last attempt to quit: 2013    Years since quitting: 7.3  . Smokeless tobacco: Never Used  . Tobacco comment: used to smoke 2PD for 40 yrs, quit about 4 years ago  Substance Use Topics  . Alcohol use: No    Alcohol/week: 0.0 standard drinks    Comment: occasional    ALLERGIES:  Allergies  Allergen Reactions  . Hydrocodone Anaphylaxis  . Morphine Other (See Comments)    Loss of memory  . Quetiapine Other (See Comments)    Insomnia, agitation, anxiety  . Ambien [Zolpidem] Other (See Comments)    delirium    . Brilinta [Ticagrelor] Other (See Comments)    Stroke   . Flexeril [Cyclobenzaprine] Other (See Comments)    delerium   . Flunitrazepam Other (See Comments)    ROHYPNOL (hallucinations)  . Haldol [Haloperidol Lactate] Other (See Comments)    delerium   . Levetiracetam Diarrhea and Other (See Comments)    Unable to walk  . Lorazepam Hives  . Risperdal [Risperidone] Other (See Comments)    Delirium   . Trazodone Other (See Comments)    Delirium Can take in low doses   . Benadryl [Diphenhydramine Hcl (Sleep)] Rash  . Penicillins Rash    Mouth ulcers Has patient had a PCN reaction causing immediate rash, facial/tongue/throat swelling, SOB or lightheadedness with hypotension: Yes Has patient had a PCN reaction causing severe rash involving mucus membranes or skin necrosis: No Has patient had a PCN reaction that required hospitalization No Has patient had a PCN reaction occurring within the last 10 years: Yes If all of the above answers are "NO", then may proceed with Cephalosporin use.  . Vancomycin Rash    Rash around IV site during vancomycin infusion     PERTINENT MEDICATIONS:  Outpatient Encounter Medications as of 12/15/2018  Medication Sig  . acetaminophen (TYLENOL) 500 MG tablet Place 1 tablet (500 mg total) into feeding tube every 6 (six) hours as needed for mild pain.  Marland Kitchen  amoxicillin-clavulanate (AUGMENTIN) 400-57 MG/5ML suspension Take 10.5 mLs (840 mg total) by mouth 2 (two) times daily for 7 days.  Marland Kitchen aspirin 81 MG chewable tablet Place 1 tablet (81 mg total) into feeding tube daily.  Marland Kitchen atorvastatin (LIPITOR) 80 MG tablet Place 1 tablet (80 mg total) into feeding tube daily.  . bisacodyl (DULCOLAX) 10 MG suppository Place 1 suppository (10 mg total) rectally daily for 10 days.  Vassie Loll Gel Base (HYDROGEL) GEL Apply 1 application topically daily.  . chlorhexidine gluconate, MEDLINE KIT, (PERIDEX) 0.12 % solution 15 mLs by Mouth Rinse route 2 (two) times daily.  . cholecalciferol (D-VI-SOL) 10 MCG/ML LIQD Place 2.5 mLs (1,000 Units total) into feeding tube daily.  . cholecalciferol (D-VI-SOL) 10 MCG/ML LIQD Place 2.5 mLs (1,000 Units total) into feeding tube daily.  . clopidogrel (PLAVIX) 75 MG tablet Place 1 tablet (75 mg total) into  feeding tube daily.  . collagenase (SANTYL) ointment Apply topically daily.  Marland Kitchen donepezil (ARICEPT) 10 MG tablet Place 1 tablet (10 mg total) into feeding tube 2 (two) times daily.  Marland Kitchen doxazosin (CARDURA) 2 MG tablet Take 1 tablet (2 mg total) by mouth Nightly.  . folic acid (FOLVITE) 1 MG tablet Take 1 tablet (1 mg total) by mouth daily.  . furosemide (LASIX) 40 MG tablet Place 1 tablet (40 mg total) into feeding tube daily.  . insulin aspart (NOVOLOG) 100 UNIT/ML injection Inject 3 Units into the skin every 6 (six) hours.  . insulin glargine (LANTUS) 100 UNIT/ML injection Inject 0.15 mLs (15 Units total) into the skin daily.  . isosorbide mononitrate (IMDUR) 30 MG 24 hr tablet Take 1 tablet (30 mg total) by mouth 2 (two) times daily. Per pegtube  . lacosamide (VIMPAT) 50 MG TABS tablet Place 2 tablets (100 mg total) into feeding tube 2 (two) times daily.  Marland Kitchen lidocaine (LIDODERM) 5 % Place 1 patch onto the skin daily. Remove & Discard patch within 12 hours or as directed by MD  . loperamide (IMODIUM) 2 MG capsule Take 1 capsule (2 mg  total) by mouth as needed for diarrhea or loose stools.  Marland Kitchen losartan (COZAAR) 100 MG tablet Take 1 tablet (100 mg total) by mouth daily.  . memantine (NAMENDA) 10 MG tablet Place 1 tablet (10 mg total) into feeding tube 2 (two) times daily.  . metoCLOPramide (REGLAN) 5 MG tablet Place 1 tablet (5 mg total) into feeding tube 4 (four) times daily -  before meals and at bedtime.  . metoprolol tartrate (LOPRESSOR) 25 MG tablet Place 1 tablet (25 mg total) into feeding tube 2 (two) times daily.  Marland Kitchen mouth rinse LIQD solution 15 mLs by Mouth Rinse route 2 (two) times daily as needed.  . Multiple Vitamin (MULTIVITAMIN) LIQD Take 30 mLs by mouth daily.  . Nutritional Supplements (FEEDING SUPPLEMENT, GLUCERNA 1.5 CAL,) LIQD Place 1,000 mLs into feeding tube continuous.  Marland Kitchen nystatin (MYCOSTATIN) 100000 UNIT/ML suspension Take 5 mLs (500,000 Units total) by mouth 4 (four) times daily.  . pantoprazole sodium (PROTONIX) 40 mg/20 mL PACK Place 20 mLs (40 mg total) into feeding tube daily.  . potassium chloride (KLOR-CON) 20 MEQ packet Place 20 mEq into feeding tube daily.  Marland Kitchen senna-docusate (SENOKOT-S) 8.6-50 MG tablet Place 2 tablets into feeding tube Nightly.  . sodium chloride 0.9 % infusion Inject 500 mLs into the vein daily. Please infuse over 2 hours  . vitamin C (VITAMIN C) 250 MG tablet Place 1 tablet (250 mg total) into feeding tube 2 (two) times daily.  . Water For Irrigation, Sterile (FREE WATER) SOLN Place 75 mLs into feeding tube every 4 (four) hours.   No facility-administered encounter medications on file as of 12/15/2018.     PHYSICAL EXAM:   Weight: 113 lbs. Last recorded weight from 12/09/2018 General: ill and frail appearing, thin Pulmonary: no cough, no SOB Abdomen: tube feeding per g tube, wife manages tube feeds. Extremities: no edema, blisters arising on LE Skin: large sacral wound, unstagable with eschar.Multiple blisters on legs and feet Neurological: Weakness, non verbal on exam,  bedbound, min.bed mobility.  Cyndia Skeeters DNP, AGPCNP-BC

## 2018-12-17 ENCOUNTER — Other Ambulatory Visit: Payer: Self-pay

## 2018-12-17 ENCOUNTER — Emergency Department: Payer: PPO

## 2018-12-17 ENCOUNTER — Encounter: Payer: Self-pay | Admitting: Emergency Medicine

## 2018-12-17 ENCOUNTER — Emergency Department
Admission: EM | Admit: 2018-12-17 | Discharge: 2018-12-17 | DRG: 871 | Disposition: A | Discharge status: Home / Self Care | Payer: PPO | Attending: Family Medicine | Admitting: Family Medicine

## 2018-12-17 DIAGNOSIS — E1122 Type 2 diabetes mellitus with diabetic chronic kidney disease: Secondary | ICD-10-CM | POA: Diagnosis not present

## 2018-12-17 DIAGNOSIS — R279 Unspecified lack of coordination: Secondary | ICD-10-CM | POA: Diagnosis not present

## 2018-12-17 DIAGNOSIS — Z820 Family history of epilepsy and other diseases of the nervous system: Secondary | ICD-10-CM | POA: Diagnosis not present

## 2018-12-17 DIAGNOSIS — K219 Gastro-esophageal reflux disease without esophagitis: Secondary | ICD-10-CM | POA: Diagnosis present

## 2018-12-17 DIAGNOSIS — Z79899 Other long term (current) drug therapy: Secondary | ICD-10-CM | POA: Diagnosis not present

## 2018-12-17 DIAGNOSIS — Z7982 Long term (current) use of aspirin: Secondary | ICD-10-CM

## 2018-12-17 DIAGNOSIS — N189 Chronic kidney disease, unspecified: Secondary | ICD-10-CM | POA: Diagnosis present

## 2018-12-17 DIAGNOSIS — A419 Sepsis, unspecified organism: Secondary | ICD-10-CM | POA: Diagnosis not present

## 2018-12-17 DIAGNOSIS — J80 Acute respiratory distress syndrome: Secondary | ICD-10-CM | POA: Diagnosis not present

## 2018-12-17 DIAGNOSIS — Z8349 Family history of other endocrine, nutritional and metabolic diseases: Secondary | ICD-10-CM

## 2018-12-17 DIAGNOSIS — Z20828 Contact with and (suspected) exposure to other viral communicable diseases: Secondary | ICD-10-CM | POA: Diagnosis present

## 2018-12-17 DIAGNOSIS — Z794 Long term (current) use of insulin: Secondary | ICD-10-CM | POA: Diagnosis not present

## 2018-12-17 DIAGNOSIS — J189 Pneumonia, unspecified organism: Secondary | ICD-10-CM | POA: Diagnosis not present

## 2018-12-17 DIAGNOSIS — R0602 Shortness of breath: Secondary | ICD-10-CM

## 2018-12-17 DIAGNOSIS — R069 Unspecified abnormalities of breathing: Secondary | ICD-10-CM | POA: Diagnosis not present

## 2018-12-17 DIAGNOSIS — Z8507 Personal history of malignant neoplasm of pancreas: Secondary | ICD-10-CM | POA: Diagnosis not present

## 2018-12-17 DIAGNOSIS — I2510 Atherosclerotic heart disease of native coronary artery without angina pectoris: Secondary | ICD-10-CM | POA: Diagnosis present

## 2018-12-17 DIAGNOSIS — Z743 Need for continuous supervision: Secondary | ICD-10-CM | POA: Diagnosis not present

## 2018-12-17 DIAGNOSIS — I509 Heart failure, unspecified: Secondary | ICD-10-CM | POA: Diagnosis present

## 2018-12-17 DIAGNOSIS — Z833 Family history of diabetes mellitus: Secondary | ICD-10-CM | POA: Diagnosis not present

## 2018-12-17 DIAGNOSIS — R52 Pain, unspecified: Secondary | ICD-10-CM | POA: Diagnosis not present

## 2018-12-17 DIAGNOSIS — Z823 Family history of stroke: Secondary | ICD-10-CM | POA: Diagnosis not present

## 2018-12-17 DIAGNOSIS — F0390 Unspecified dementia without behavioral disturbance: Secondary | ICD-10-CM | POA: Diagnosis not present

## 2018-12-17 DIAGNOSIS — R404 Transient alteration of awareness: Secondary | ICD-10-CM | POA: Diagnosis not present

## 2018-12-17 DIAGNOSIS — Z8249 Family history of ischemic heart disease and other diseases of the circulatory system: Secondary | ICD-10-CM

## 2018-12-17 DIAGNOSIS — J9690 Respiratory failure, unspecified, unspecified whether with hypoxia or hypercapnia: Secondary | ICD-10-CM | POA: Diagnosis not present

## 2018-12-17 LAB — CBC WITH DIFFERENTIAL/PLATELET
Abs Immature Granulocytes: 0.06 10*3/uL (ref 0.00–0.07)
Basophils Absolute: 0 10*3/uL (ref 0.0–0.1)
Basophils Relative: 0 %
Eosinophils Absolute: 0 10*3/uL (ref 0.0–0.5)
Eosinophils Relative: 0 %
HCT: 37.1 % — ABNORMAL LOW (ref 39.0–52.0)
Hemoglobin: 11.2 g/dL — ABNORMAL LOW (ref 13.0–17.0)
Immature Granulocytes: 1 %
Lymphocytes Relative: 10 %
Lymphs Abs: 1.2 10*3/uL (ref 0.7–4.0)
MCH: 26.9 pg (ref 26.0–34.0)
MCHC: 30.2 g/dL (ref 30.0–36.0)
MCV: 89 fL (ref 80.0–100.0)
Monocytes Absolute: 0.5 10*3/uL (ref 0.1–1.0)
Monocytes Relative: 5 %
Neutro Abs: 9.5 10*3/uL — ABNORMAL HIGH (ref 1.7–7.7)
Neutrophils Relative %: 84 %
Platelets: 294 10*3/uL (ref 150–400)
RBC: 4.17 MIL/uL — ABNORMAL LOW (ref 4.22–5.81)
RDW: 17.2 % — ABNORMAL HIGH (ref 11.5–15.5)
WBC: 11.3 10*3/uL — ABNORMAL HIGH (ref 4.0–10.5)
nRBC: 0 % (ref 0.0–0.2)

## 2018-12-17 LAB — URINALYSIS, COMPLETE (UACMP) WITH MICROSCOPIC
Bacteria, UA: NONE SEEN
Bilirubin Urine: NEGATIVE
Glucose, UA: NEGATIVE mg/dL
Hgb urine dipstick: NEGATIVE
Ketones, ur: 5 mg/dL — AB
Nitrite: NEGATIVE
Protein, ur: 30 mg/dL — AB
RBC / HPF: >50 RBC/hpf — ABNORMAL HIGH (ref 0–5)
Specific Gravity, Urine: 1.03 (ref 1.005–1.030)
Squamous Epithelial / HPF: NONE SEEN (ref 0–5)
WBC, UA: >50 WBC/hpf — ABNORMAL HIGH (ref 0–5)
pH: 5 (ref 5.0–8.0)

## 2018-12-17 LAB — BLOOD GAS, ARTERIAL
Acid-Base Excess: 3.9 mmol/L — ABNORMAL HIGH (ref 0.0–2.0)
Bicarbonate: 27.7 mmol/L (ref 20.0–28.0)
FIO2: 1
O2 Saturation: 94.2 %
Patient temperature: 37
pCO2 arterial: 38 mmHg (ref 32.0–48.0)
pH, Arterial: 7.47 — ABNORMAL HIGH (ref 7.350–7.450)
pO2, Arterial: 67 mmHg — ABNORMAL LOW (ref 83.0–108.0)

## 2018-12-17 LAB — COMPREHENSIVE METABOLIC PANEL
ALT: 22 U/L (ref 0–44)
AST: 22 U/L (ref 15–41)
Albumin: 2.3 g/dL — ABNORMAL LOW (ref 3.5–5.0)
Alkaline Phosphatase: 120 U/L (ref 38–126)
Anion gap: 13 (ref 5–15)
BUN: 22 mg/dL (ref 8–23)
CO2: 27 mmol/L (ref 22–32)
Calcium: 8.2 mg/dL — ABNORMAL LOW (ref 8.9–10.3)
Chloride: 99 mmol/L (ref 98–111)
Creatinine, Ser: 0.5 mg/dL — ABNORMAL LOW (ref 0.61–1.24)
GFR calc Af Amer: >60 mL/min (ref >60)
GFR calc non Af Amer: >60 mL/min (ref >60)
Glucose, Bld: 145 mg/dL — ABNORMAL HIGH (ref 70–99)
Potassium: 3.9 mmol/L (ref 3.5–5.1)
Sodium: 139 mmol/L (ref 135–145)
Total Bilirubin: 0.6 mg/dL (ref 0.3–1.2)
Total Protein: 7.1 g/dL (ref 6.5–8.1)

## 2018-12-17 LAB — PROCALCITONIN: Procalcitonin: 0.65 ng/mL

## 2018-12-17 LAB — GLUCOSE, CAPILLARY: Glucose-Capillary: 130 mg/dL — ABNORMAL HIGH (ref 70–99)

## 2018-12-17 LAB — LACTIC ACID, PLASMA: Lactic Acid, Venous: 3.6 mmol/L (ref 0.5–1.9)

## 2018-12-17 LAB — BRAIN NATRIURETIC PEPTIDE: B Natriuretic Peptide: 94 pg/mL (ref 0.0–100.0)

## 2018-12-17 LAB — SARS CORONAVIRUS 2 BY RT PCR (HOSPITAL ORDER, PERFORMED IN ~~LOC~~ HOSPITAL LAB): SARS Coronavirus 2: NEGATIVE

## 2018-12-17 LAB — TROPONIN I: Troponin I: <0.03 ng/mL (ref <0.03)

## 2018-12-17 MED ORDER — ACETAMINOPHEN 325 MG PO TABS: 650.0000 mg | ORAL_TABLET | Freq: Four times a day (QID) | ORAL | Status: DC | PRN

## 2018-12-17 MED ORDER — SODIUM CHLORIDE 0.9 % IV SOLN: 2.0000 g | Freq: Once | INTRAVENOUS | Status: DC

## 2018-12-17 MED ORDER — SODIUM CHLORIDE 0.9 % IV SOLN
INTRAVENOUS | Status: DC
  Administered 2018-12-17: 04:00:00 via INTRAVENOUS

## 2018-12-17 MED ORDER — ENOXAPARIN SODIUM 40 MG/0.4ML ~~LOC~~ SOLN: 40.0000 mg | SUBCUTANEOUS | Status: DC

## 2018-12-17 MED ORDER — PANCRELIPASE (LIP-PROT-AMYL) 3000-9500 UNITS PO CPEP: 24000.0000 [IU] | ORAL_CAPSULE | Freq: Three times a day (TID) | ORAL | 0 refills | Status: AC

## 2018-12-17 MED ORDER — ONDANSETRON HCL 4 MG/2ML IJ SOLN: 4.0000 mg | Freq: Four times a day (QID) | INTRAMUSCULAR | Status: DC | PRN

## 2018-12-17 MED ORDER — ACETAMINOPHEN 650 MG RE SUPP
650.0000 mg | Freq: Once | RECTAL | Status: AC
  Administered 2018-12-17: 02:00:00 650 mg via RECTAL
  Filled 2018-12-17: qty 1

## 2018-12-17 MED ORDER — SODIUM CHLORIDE 0.9 % IV BOLUS
1000.0000 mL | Freq: Once | INTRAVENOUS | Status: AC
  Administered 2018-12-17: 02:00:00 1000 mL via INTRAVENOUS

## 2018-12-17 MED ORDER — METRONIDAZOLE 500 MG PO TABS
500.0000 mg | ORAL_TABLET | Freq: Once | ORAL | Status: AC
  Administered 2018-12-17: 04:00:00 500 mg via NASOGASTRIC
  Filled 2018-12-17: qty 1

## 2018-12-17 MED ORDER — SODIUM CHLORIDE 0.9 % IV SOLN
2.0000 g | Freq: Three times a day (TID) | INTRAVENOUS | Status: DC
  Administered 2018-12-17: 03:00:00 2 g via INTRAVENOUS
  Filled 2018-12-17: qty 2

## 2018-12-17 MED ORDER — CEFDINIR 300 MG PO CAPS: 300.0000 mg | ORAL_CAPSULE | Freq: Two times a day (BID) | ORAL | 0 refills | Status: AC

## 2018-12-17 MED ORDER — ACETAMINOPHEN 650 MG RE SUPP: 650.0000 mg | Freq: Four times a day (QID) | RECTAL | Status: DC | PRN

## 2018-12-17 MED ORDER — ONDANSETRON HCL 4 MG PO TABS: 4.0000 mg | ORAL_TABLET | Freq: Four times a day (QID) | ORAL | Status: DC | PRN

## 2018-12-17 MED ORDER — METRONIDAZOLE IN NACL 5-0.79 MG/ML-% IV SOLN
500.0000 mg | Freq: Once | INTRAVENOUS | Status: DC
  Filled 2018-12-17: qty 100

## 2018-12-17 MED ORDER — PANCRELIPASE (LIP-PROT-AMYL) 12000-38000 UNITS PO CPEP
24000.0000 [IU] | ORAL_CAPSULE | Freq: Three times a day (TID) | ORAL | Status: DC
  Filled 2018-12-17 (×3): qty 2

## 2018-12-17 MED ORDER — SODIUM CHLORIDE 0.9 % IV BOLUS
500.0000 mL | Freq: Once | INTRAVENOUS | Status: AC
  Administered 2018-12-17: 500 mL via INTRAVENOUS

## 2018-12-17 MED ORDER — SODIUM CHLORIDE 0.9 % IV SOLN
1.0000 g | Freq: Once | INTRAVENOUS | Status: DC
  Filled 2018-12-17: qty 1

## 2018-12-17 MED ORDER — VANCOMYCIN HCL IN DEXTROSE 1-5 GM/200ML-% IV SOLN: 1000.0000 mg | Freq: Once | INTRAVENOUS | Status: DC

## 2018-12-17 MED ORDER — VANCOMYCIN HCL 10 G IV SOLR
1250.0000 mg | Freq: Once | INTRAVENOUS | Status: AC
  Administered 2018-12-17: 1250 mg via INTRAVENOUS
  Filled 2018-12-17: qty 1250

## 2018-12-17 NOTE — Progress Notes (Signed)
CODE SEPSIS - PHARMACY COMMUNICATION  **Broad Spectrum Antibiotics should be administered within 1 hour of Sepsis diagnosis**  Time Code Sepsis Called/Page Received: 0136  Antibiotics Ordered: vanc/cefepime/flagyl  Time of 1st antibiotic administration: 0239  Additional action taken by pharmacy:   If necessary, Name of Provider/Nurse Contacted:     Tobie Lords ,PharmD Clinical Pharmacist  12/17/2018  2:50 AM

## 2018-12-17 NOTE — ED Notes (Signed)
No rectal temp recheck per MD

## 2018-12-17 NOTE — ED Notes (Signed)
Took pt's wife his med list and spoke with her for a bit; she is tearful and states "please don't judge me"; explained to pt that nobody here is judging her in any way for taking her husband home and that others would do the same for their family member; explained to her the plan of care-finish IV fluids and antibiotics before calling EMS for transport home; pt appreciative of the conversation; she will wait for discharge before heading home herself

## 2018-12-17 NOTE — ED Notes (Signed)
Dr Burlene Arnt notified of pt's lactic acid 3.6

## 2018-12-17 NOTE — ED Notes (Addendum)
Discussed discharge with wife; she understands we are waiting for EMS to transport home; she is still adamant about taking pt home-read pt MD's note typed up with discharge papers and she understands pt is gravely ill

## 2018-12-17 NOTE — ED Notes (Signed)
Difficulty starting second IV at this time; MD also looked for EJ; to start antibiotics prior to second set of cultures per MD:

## 2018-12-17 NOTE — ED Notes (Signed)
Pt turned for rectal temp; pt noted to have large sacral decubitus ulcer that we are unable to stage due to eschar;

## 2018-12-17 NOTE — ED Notes (Signed)
Conversation with patient's wife and Dr. Burlene Arnt took place and this RN is a witness to what was said. [name] does not want the patient to be admitted here and is not amenable to transfer to another location. She does not want him " to die here and die alone." She was educated that if [name] returns home he will likely die at home. She verbalized understanding to this and states that she wants him to die at home and surrounded by his family, "not alone in the CCU." Dr. Burlene Arnt also educated her to the care that he has received since his arrival to this ED tonight and she is appreciative of that care but continues to desire that he return home. She would like him to receive his antibiotics, have his indwelling foley changed and return home via EMS. Dr. Burlene Arnt reiterated to [name] all of the above mentioned points to his care and what would likely happen should he return home. She again verbalizes an understanding and is fully aware of the consequences of his return home. Primary nurse notified of the decision on [name]'s part.

## 2018-12-17 NOTE — ED Notes (Signed)
Attempt iv insertion x1 without success.

## 2018-12-17 NOTE — ED Notes (Signed)
Antibiotics complete; will continue to run maintenance fluids until EMS arrives to transport home; will notify secretary pt is ready; will discuss discharge instructions with wife who is waiting in family wait room;

## 2018-12-17 NOTE — Discharge Instructions (Addendum)
As we talked about, we have talked extensively about the best life for Mr. Formica.  You would prefer he not be admitted to the hospital.  We have discussed how it is likely that he will die if he goes home, although it is also certainly possible he would die if he stays here.  You , speaking as his power of attorney, feel that he would prefer that if he is going to pass he has at home surrounded by family.  Of course this is your choice, and one we respect.  You have asked that he go home with antibiotics orally, I can write for those but I am not of any strong opinion that they will help him any significant way as he is profoundly septic at this time.  Our ability to influence his death is going to stop once he goes home and even if he stays there is a good chance we would not be able to keep him with [place].  However, given the pandemic, his baseline medical problems, and your conviction that he would want to be at home in the situation, we will of course obey your wishes.  If you have any other concerns of course we never will close and you can always come back.  In the meantime, pursuant to your wishes, we will write in the chart that he has DNR/DNI status.  I realize these are very difficult decisions, and we respect you for making them.  Our thoughts will be with you and your family.

## 2018-12-17 NOTE — ED Notes (Signed)
Foley being changed out per MD; placed on 3/26; pt noted to have purulent drainage from meatus

## 2018-12-17 NOTE — ED Triage Notes (Signed)
Pt sent via EMS from home by wife for c/o shortness of breath, fever and cough; pt arrives nonverbal at baseline; EMS reports temp 101.1, HR 130's to 140's; CBG 178; sats were 78% on 6L; pt arrives on non-rebreather with sats in low 90's; indwelling foley placed 3/26 per label on bag;

## 2018-12-17 NOTE — ED Provider Notes (Addendum)
[facility] Emergency Department Provider Note  ____________________________________________   I have reviewed the triage vital signs and the nursing notes. Where available I have reviewed prior notes and, if possible and indicated, outside hospital notes.    HISTORY  Chief Complaint Code Sepsis    HPI Maurice Patterson is a 63 y.o. male with a history of CAD CHF CKD dementia DM reflux disease with frequent hospitalizations pancreatic cancer nonverbal with a G-tube, indwelling Foley, recently admitted and discharged for respiratory failure and sepsis with unknown organism, history document of penicillin allergy but was on Augmentin 10 days ago with no difficulty history of documented allergy to vancomycin but was on vancomycin recently with no difficulty, presents today unable to give a history.  He is baseline nonverbal.  His wife, who is in the waiting room, I did call.  He states he was doing "pretty good" until this evening when he started to have increasing oxygen requirement over his baseline 3 L and felt warm.  He just got home a week ago from a rehab facility she states.  She states the patient is full code despite the significant comorbidities. EMS stated he had a fever of 102 for them, had low sats in the 80s on 3 L, came up to the high 90s however on a nonrebreather.  No known coronavirus exposure.  Level 5 chart caveat; no further history available due to patient status.  Past Medical History:  Diagnosis Date  . Acute MI ([place])   . Arthritis   . Back pain   . CAD (coronary artery disease)   . CHF (congestive heart failure) ([place])   . Chronic kidney disease   . Degenerative lumbar disc   . Dementia ([place])   . Diabetes mellitus without complication ([place])   . GERD (gastroesophageal reflux disease)   . Hernia of abdominal cavity   . Hyperlipemia   . Hypertension   . Malignant intraductal papillary mucinous tumor of pancreas ([place])   . Memory loss   .  Pancreatitis   . Seizures ([place])    staring spells  . Shingles   . Stroke ([place]) 08/2017  . TIA (transient ischemic attack)     Patient Active Problem List   Diagnosis Date Noted  . Acute respiratory failure ([place]) 11/24/2018  . Respiratory failure requiring intubation ([place]) 10/24/2018  . Protein-calorie malnutrition, severe 09/20/2018  . Diabetes ([place]) 09/19/2018  . Sepsis ([place]) 09/19/2018  . Pressure injury of skin 08/12/2018  . Aspiration pneumonia ([place])   . Palliative care encounter   . Dysphagia   . Acute respiratory failure with hypoxia ([place])   . Fever   . CAP (community acquired pneumonia) 07/22/2018  . GERD (gastroesophageal reflux disease) 07/22/2018  . Hypersalivation 01/30/2018  . Drooling 01/30/2018  . Alzheimer's dementia ([place]) 05/01/2017  . Cognitive decline 10/20/2016  . Chest pain, rule out acute myocardial infarction 04/05/2016  . Confusion 10/18/2015  . Constipation 08/23/2015  . Uncontrolled type 2 diabetes mellitus with hyperglycemia, with long-term current use of insulin ([place]) 08/23/2015  . Essential hypertension 08/23/2015  . Right lower lobe pneumonia ([place]) 08/23/2015  . Abdominal pain 08/21/2015  . Altered mental status 02/19/2015    Past Surgical History:  Procedure Laterality Date  . CARDIAC CATHETERIZATION    . CORONARY ANGIOPLASTY    . CYSTOSCOPY WITH STENT PLACEMENT Right 05/13/2017   Procedure: CYSTOSCOPY WITH STENT PLACEMENT;  Surgeon: Nickie Retort, MD;  Location: [facility] ORS;  Service: Urology;  Laterality: Right;  .  ERCP N/A 09/12/2015   Procedure: ENDOSCOPIC RETROGRADE CHOLANGIOPANCREATOGRAPHY (ERCP);  Surgeon: Hulen Luster, MD;  Location: [facility] ENDOSCOPY;  Service: Gastroenterology;  Laterality: N/A;  . ERCP N/A 01/02/2016   Procedure: ENDOSCOPIC RETROGRADE CHOLANGIOPANCREATOGRAPHY (ERCP);  Surgeon: Hulen Luster, MD;  Location: [facility] ENDOSCOPY;  Service: Gastroenterology;  Laterality: N/A;  . EXTERNAL FIXATION WRIST FRACTURE Left   . left arm  metal plate    . LEFT HEART CATH AND CORONARY ANGIOGRAPHY Left 11/04/2016   Procedure: Left Heart Cath and Coronary Angiography;  Surgeon: Isaias Cowman, MD;  Location: [facility] CV LAB;  Service: Cardiovascular;  Laterality: Left;  . Left shoulder surgery    . PEG PLACEMENT Left 08/28/2018   Procedure: PERCUTANEOUS ENDOSCOPIC GASTROSTOMY (PEG) PLACEMENT;  Surgeon: Lin Landsman, MD;  Location: [facility] ENDOSCOPY;  Service: Gastroenterology;  Laterality: Left;  . Stent times 3  2013   Cardiac  . TOTAL ELBOW REPLACEMENT Left   . VASECTOMY    . VENTRICULOPERITONEAL SHUNT      Prior to Admission medications   Medication Sig Start Date End Date Taking? Authorizing Provider  acetaminophen (TYLENOL) 500 MG tablet Place 1 tablet (500 mg total) into feeding tube every 6 (six) hours as needed for mild pain. 12/07/18   Salary, Avel Peace, MD  aspirin 81 MG chewable tablet Place 1 tablet (81 mg total) into feeding tube daily. 12/07/18   Salary, Avel Peace, MD  atorvastatin (LIPITOR) 80 MG tablet Place 1 tablet (80 mg total) into feeding tube daily. 12/07/18 12/08/19  Salary, Avel Peace, MD  bisacodyl (DULCOLAX) 10 MG suppository Place 1 suppository (10 mg total) rectally daily for 10 days. 12/07/18 12/17/18  Salary, Avel Peace, MD  Carbomer Gel Base (HYDROGEL) GEL Apply 1 application topically daily. 12/07/18   Salary, Avel Peace, MD  chlorhexidine gluconate, MEDLINE KIT, (PERIDEX) 0.12 % solution 15 mLs by Mouth Rinse route 2 (two) times daily. 12/06/18   Salary, Holly Bodily D, MD  cholecalciferol (D-VI-SOL) 10 MCG/ML LIQD Place 2.5 mLs (1,000 Units total) into feeding tube daily. 09/07/18   Hillary Bow, MD  cholecalciferol (D-VI-SOL) 10 MCG/ML LIQD Place 2.5 mLs (1,000 Units total) into feeding tube daily. 12/08/18   Salary, Avel Peace, MD  clopidogrel (PLAVIX) 75 MG tablet Place 1 tablet (75 mg total) into feeding tube daily. 12/07/18   Salary, Avel Peace, MD  collagenase (SANTYL) ointment Apply topically  daily. 12/06/18   Salary, Avel Peace, MD  donepezil (ARICEPT) 10 MG tablet Place 1 tablet (10 mg total) into feeding tube 2 (two) times daily. 12/07/18   Salary, Avel Peace, MD  doxazosin (CARDURA) 2 MG tablet Take 1 tablet (2 mg total) by mouth Nightly. 12/07/18 12/07/19  Salary, Avel Peace, MD  folic acid (FOLVITE) 1 MG tablet Take 1 tablet (1 mg total) by mouth daily. 12/07/18 12/07/19  Salary, Avel Peace, MD  furosemide (LASIX) 40 MG tablet Place 1 tablet (40 mg total) into feeding tube daily. 12/07/18   Salary, Avel Peace, MD  insulin aspart (NOVOLOG) 100 UNIT/ML injection Inject 3 Units into the skin every 6 (six) hours. 12/07/18   Salary, Holly Bodily D, MD  insulin glargine (LANTUS) 100 UNIT/ML injection Inject 0.15 mLs (15 Units total) into the skin daily. 12/07/18 12/07/19  Salary, Avel Peace, MD  isosorbide mononitrate (IMDUR) 30 MG 24 hr tablet Take 1 tablet (30 mg total) by mouth 2 (two) times daily. Per pegtube 12/07/18   Salary, Avel Peace, MD  lacosamide (VIMPAT) 50 MG TABS tablet Place 2 tablets (  100 mg total) into feeding tube 2 (two) times daily. 12/07/18 12/07/19  Salary, Avel Peace, MD  lidocaine (LIDODERM) 5 % Place 1 patch onto the skin daily. Remove & Discard patch within 12 hours or as directed by MD 12/07/18   Salary, Avel Peace, MD  loperamide (IMODIUM) 2 MG capsule Take 1 capsule (2 mg total) by mouth as needed for diarrhea or loose stools. 12/07/18   Salary, Avel Peace, MD  losartan (COZAAR) 100 MG tablet Take 1 tablet (100 mg total) by mouth daily. 12/07/18   Salary, Avel Peace, MD  memantine (NAMENDA) 10 MG tablet Place 1 tablet (10 mg total) into feeding tube 2 (two) times daily. 12/07/18   Salary, Avel Peace, MD  metoCLOPramide (REGLAN) 5 MG tablet Place 1 tablet (5 mg total) into feeding tube 4 (four) times daily -  before meals and at bedtime. 12/07/18   Salary, Avel Peace, MD  metoprolol tartrate (LOPRESSOR) 25 MG tablet Place 1 tablet (25 mg total) into feeding tube 2 (two) times daily. 12/07/18    Salary, Avel Peace, MD  mouth rinse LIQD solution 15 mLs by Mouth Rinse route 2 (two) times daily as needed. 12/06/18   Salary, Avel Peace, MD  Multiple Vitamin (MULTIVITAMIN) LIQD Take 30 mLs by mouth daily.    [provider]  Nutritional Supplements (FEEDING SUPPLEMENT, GLUCERNA 1.5 CAL,) LIQD Place 1,000 mLs into feeding tube continuous. 12/06/18   Salary, Avel Peace, MD  nystatin (MYCOSTATIN) 100000 UNIT/ML suspension Take 5 mLs (500,000 Units total) by mouth 4 (four) times daily. 12/07/18   Salary, Avel Peace, MD  pantoprazole sodium (PROTONIX) 40 mg/20 mL PACK Place 20 mLs (40 mg total) into feeding tube daily. 12/07/18   Salary, Avel Peace, MD  potassium chloride (KLOR-CON) 20 MEQ packet Place 20 mEq into feeding tube daily. 12/08/18   Salary, Avel Peace, MD  senna-docusate (SENOKOT-S) 8.6-50 MG tablet Place 2 tablets into feeding tube Nightly. 12/07/18   Salary, Holly Bodily D, MD  sodium chloride 0.9 % infusion Inject 500 mLs into the vein daily. Please infuse over 2 hours 12/07/18   Salary, Avel Peace, MD  vitamin C (VITAMIN C) 250 MG tablet Place 1 tablet (250 mg total) into feeding tube 2 (two) times daily. 12/07/18   Salary, Avel Peace, MD  Water For Irrigation, Sterile (FREE WATER) SOLN Place 75 mLs into feeding tube every 4 (four) hours. 12/06/18   Salary, Avel Peace, MD    Allergies Hydrocodone; Morphine; Quetiapine; Ambien [zolpidem]; Brilinta [ticagrelor]; Flexeril [cyclobenzaprine]; Flunitrazepam; Haldol [haloperidol lactate]; Levetiracetam; Lorazepam; Risperdal [risperidone]; Trazodone; Benadryl [diphenhydramine hcl (sleep)]; Penicillins; and Vancomycin  Family History  Problem Relation Age of Onset  . Diabetes Mother   . Hypertension Mother   . CAD Mother   . Hyperlipidemia Mother   . Stroke Mother   . ALS Mother   . Alzheimer's disease Father   . Diabetes Father   . Heart Problems Brother     Social History Social History   Tobacco Use  . Smoking status: Former Smoker     Last attempt to quit: 2013    Years since quitting: 7.3  . Smokeless tobacco: Never Used  . Tobacco comment: used to smoke 2PD for 40 yrs, quit about 4 years ago  Substance Use Topics  . Alcohol use: No    Alcohol/week: 0.0 standard drinks    Comment: occasional  . Drug use: No    Review of Systems Level 5 chart caveat; no further history available due to  patient status.   ____________________________________________   PHYSICAL EXAM:  VITAL SIGNS: ED Triage Vitals  Enc Vitals Group     BP      Pulse      Resp      Temp      Temp src      SpO2      Weight      Height      Head Circumference      Peak Flow      Pain Score      Pain Loc      Pain Edu?      Excl. in [place]?     Constitutional: Alert alert, chronically ill-appearing cachectic man Eyes: Conjunctivae are normal Head: Atraumatic HEENT: No congestion/rhinnorhea. Mucous membranes are dry.  Oropharynx non-erythematous Neck:   Nontender with no meningismus, no masses, no stridor Cardiovascular: Normal rate, regular rhythm. Grossly normal heart sounds.  Good peripheral circulation. Respiratory: Tachypnea noted limited auscultation secondary to personal protective equipment mandatory for staff Abdominal: Soft and nontender. No distention. No guarding no rebound Back:  There is no focal tenderness or step off.  there is no midline tenderness there are no lesions noted. there is no CVA tenderness Foley in place Musculoskeletal: No lower extremity tenderness, no upper extremity tenderness. No joint effusions, no DVT signs strong distal pulses no edema Neurologic: Nonverbal not following commands Skin:  Skin is warm, significant sacral decubitus ulcer with surrounding erythema, eschar noted.   ____________________________________________   LABS (all labs ordered are listed, but only abnormal results are displayed)  Labs Reviewed  CULTURE, BLOOD (ROUTINE X 2)  CULTURE, BLOOD (ROUTINE X 2)  URINE CULTURE  SARS  CORONAVIRUS 2 (HOSPITAL ORDER, [place] [facility])  LACTIC ACID, PLASMA  LACTIC ACID, PLASMA  COMPREHENSIVE METABOLIC PANEL  CBC WITH DIFFERENTIAL/PLATELET  URINALYSIS, ROUTINE W REFLEX MICROSCOPIC  PROCALCITONIN  URINALYSIS, COMPLETE (UACMP) WITH MICROSCOPIC  TROPONIN I  CBG MONITORING, ED    Pertinent labs  results that were available during my care of the patient were reviewed by me and considered in my medical decision making (see chart for details). ____________________________________________  EKG  I personally interpreted any EKGs ordered by me or triage Sinus tachycardia rate 134, normal axis, baseline artifact limits interpretation no acute ischemia ____________________________________________  RADIOLOGY  Pertinent labs & imaging results that were available during my care of the patient were reviewed by me and considered in my medical decision making (see chart for details). If possible, patient and/or family made aware of any abnormal findings.  No results found. ____________________________________________    PROCEDURES  Procedure(s) performed: None  Procedures  Critical Care performed: None  ____________________________________________   INITIAL IMPRESSION / ASSESSMENT AND PLAN / ED COURSE  Pertinent labs & imaging results that were available during my care of the patient were reviewed by me and considered in my medical decision making (see chart for details).  Acutely ill patient, however, sats are in the high 90s on nonrebreather heart rate is coming down with IV fluids we are giving him PR Tylenol, I am writing him for hospital-acquired pneumonia given his increasing oxygen demand over baseline although I suspect he could also be septic from his decubitus ulcer, he also could be bacteremic from any of a number of different sources including his indwelling Foley catheter.  Cultures have been obtained IVs have been obtained I am giving him  30 mix per kg of IV fluid, patient status discussed with wife.  Very very poor prognosis.  She wants her to be full code nonetheless.  Suggest inpatient palliative care consult happen.  At this time I do not think the patient needs to be intubated, that could certainly change.  His sats are good he is to be guarding his airway to a degree that is probably close to his baseline, given his recent vancomycin, hospitalization and penicillin, we will not shy away from medications such as cephalosporin and vancomycin I did talk to the pharmacist and they are already preparing antibiotics.  We already have IVs on him.  We will change his Foley catheter.  We are sending coronavirus on him.  X-ray and blood work are all pending at this time.  Patient is acutely ill  ----------------------------------------- 2:21 AM on 12/17/2018 -----------------------------------------  *100% on the nonrebreather, we will get a ABG, have talked to the hospitalist agree with management and will admit  ----------------------------------------- 2:30 AM on 12/17/2018 -----------------------------------------  Informed by staff that the patient's wife, whom I already spoke to, and is power of attorney, despite calling 911 for this patient and sending him here does not want him to be in the hospital.  She wants him to go home.  It is reported that she wants to take him home even though that would likely result in his death.  I will see if I can talk to her there is a world pandemic and there are limited visitors to all ERs in this country at this time.  Given his overall condition and his likely poor prognosis, honestly I do not think this is a bad decision however, I will bring the wife back and she and I will discuss the patient's likely outcome.  She is requesting that she go home with antibiotics which again I do not think will be sufficient to keep him alive but we will talk to her about their choices.  Certainly a patient with  his comorbidities has the right to die at home if that is what his family wants to do  ----------------------------------------- 2:53 AM on 12/17/2018 -----------------------------------------  An extensive talk with his wife who is his power of attorney, she speaks for the family.  Patient prognosis discussed.  She does not want to have him in the hospital she does not want him to die alone she understands that his case is very bleak.  She understands explicitly and I did tell her exactly these words, "if you go home he will likely die".  She understands that this is a limitation.  I can send him with antibiotics but they will likely not be sufficient to keep him alive.  He has multiple different possible sources of infection and is profoundly septic.  Family understands that his quality of life has been getting progressively worse over the last couple months and they do not want him in the hospital they him to go home and they want him to die there if he is going to die.  I think this is certainly not unreasonable given the patient's extensive comorbidities.  This is what she believes that he would want.  Therefore, we will stop try to get a second IV, I will, after discussion with the wife, give him the antibiotics I have ordered here, and we will give him the IV fluid bolus that we have ordered for him, not that this will stop him from dying but it may give family some extra time together.  We will send him home he is already on  Augmentin, family is requesting antibiotics for home use.  It is hard to know what possible antibiotic I can give him at home that might help him, we will consider giving him an antibiotic anyway although again this is not likely to prolong his life given his profound sepsis.  Speaking personally, I think this is a reasonable decision, the patient's prognosis is terrible even if he stays in the hospital his quality of life is terrible according to family and what I can see, and  this is what he would want.  We also agreed that the patient is DNR/DNI at this time.  That is to say if he does code they do not want him intubated they do not want resuscitation.  We will arrange transport back to the house.  I did offer to call hospice for the family, and she declined at this time.  Charge nurse, Marliss Czar, was present for this conversation and she herself will also document this conversation.  In the patient's comorbidities I think this is a reasonable course of action.  Also convinced that he never would have ever gotten the sick cat he only been allowed to continue taking Creon and she very much would like me to reorder Creon for him.  Given the circumstances, even though this is not something usually done from the emergency department, I do not see much harm in trying this as it seems as if it will set her mind somewhat at ease..    ----------------------------------------- 3:53 AM on 12/17/2018 -----------------------------------------  Family still feel this is the best plan and we will defer to their loving judgement. We have changed his foley.  ____________________________________________   FINAL CLINICAL IMPRESSION(S) / ED DIAGNOSES  Final diagnoses:  SOB (shortness of breath)      This chart was dictated using voice recognition software.  Despite best efforts to proofread,  errors can occur which can change meaning.      Schuyler Amor, MD 12/17/18 [id number]    Schuyler Amor, MD 12/17/18 [id number]    Schuyler Amor, MD 12/17/18 [id number]    Schuyler Amor, MD 12/17/18 [id number]    Schuyler Amor, MD 12/17/18 [id number]

## 2018-12-17 NOTE — ED Notes (Signed)
Pt noted to have congested cough

## 2018-12-18 LAB — URINE CULTURE: Culture: NO GROWTH

## 2018-12-19 DIAGNOSIS — Z451 Encounter for adjustment and management of infusion pump: Secondary | ICD-10-CM | POA: Diagnosis not present

## 2018-12-19 DIAGNOSIS — L89150 Pressure ulcer of sacral region, unstageable: Secondary | ICD-10-CM | POA: Diagnosis not present

## 2018-12-19 DIAGNOSIS — Z434 Encounter for attention to other artificial openings of digestive tract: Secondary | ICD-10-CM | POA: Diagnosis not present

## 2018-12-20 ENCOUNTER — Telehealth: Payer: Self-pay | Admitting: Primary Care

## 2018-12-20 DIAGNOSIS — K8689 Other specified diseases of pancreas: Secondary | ICD-10-CM | POA: Diagnosis not present

## 2018-12-20 DIAGNOSIS — Z515 Encounter for palliative care: Secondary | ICD-10-CM | POA: Diagnosis not present

## 2018-12-20 DIAGNOSIS — R627 Adult failure to thrive: Secondary | ICD-10-CM | POA: Diagnosis not present

## 2018-12-20 NOTE — Telephone Encounter (Signed)
T/c from Dr Astrid Divine  Has called thorazine 10 mg for pt for hiccups, states to use 2.5 - 5mg  for hiccups.  RE morphine and pain control: Wife had thought morphine was in an IV drip form. Could use 2.5 to 5 mg of morphine for pain management. 10 mg/ 5 ml. Use 2 mg every 4 hours po or via tube.  Recommends hospice admission if family is in agreement.  Also Roylene Reason, NP is an Biomedical scientist, [employer] [phone]. They are also involved with this case.

## 2018-12-21 ENCOUNTER — Other Ambulatory Visit: Payer: Self-pay

## 2018-12-21 ENCOUNTER — Other Ambulatory Visit: Payer: PPO | Admitting: Primary Care

## 2018-12-21 DIAGNOSIS — Z515 Encounter for palliative care: Secondary | ICD-10-CM | POA: Diagnosis not present

## 2018-12-21 NOTE — Progress Notes (Signed)
[facility] Palliative Care Consult Note Telephone: [phone]  Fax: [phone]   TELEHEALTH VISIT STATEMENT Due to the COVID-19 crisis, this visit was done via telemedicine from my office. It was initiated and consented to by this patient and/or family.  PATIENT NAME: Maurice Patterson DOB: Jun 30, 1956 MRN: [id number]  PRIMARY CARE PROVIDER:   [name], MD  REFERRING PROVIDER:  [name], MD [address] [place], [place] [address]  RESPONSIBLE PARTY:   Extended Emergency Contact Information Primary Emergency Contact: Maurice Patterson Address: [address]          [place], [place] [address] Maurice Patterson Phone: [phone] Work Phone: [phone] Relation: Spouse Secondary Emergency Contact: Maurice Patterson Address: [address]          [place], [place] [address] Maurice Patterson Phone: [phone] Mobile Phone: [phone] Relation: Daughter  Palliative Care was asked to follow patient by consultation request of Dr. [name], MD. This is Patterson follow up visit.  ASSESSMENT and RECOMMENDATIONS:   1. Pain: Recommend Change to morphine sulfate liquid  20 mg/ ml concentrate,  would recommend 2.5 mg q 4 hrs po or per tube. Education RE different concentration and different amount. Has had excellent but not total  response to morphine 2 mg every 5 hours.  Explained to family advantage of q 4 h dosing as related to drug half life.   This is conventional dosing and more common concentration for palliative care needs. Needs sent to [facility] in [place] as they have in stock.   2. Dietician: Ordered by MD to come out from [facility] and assess for protein needs for tube feedings. Wife received Patterson call and RD will set up consultation appt.   3. Hiccups: Still has not obtained thorazine due to insurance. Working on this hopefully will get it today. There was an issue with insurance coverage which has been worked  out. Wife said she's using baclofen for hiccups currently.  4. Feedings: Tolerating Continuous feedings per tube of glucerna. Newly back on  Creon: is dissolving it in  30 ml water and 1/4 t. baking sodaso the granules will  go down tube without occlusion. Wife states she was advised this method by pharmacist. [place] Scrape creon use will improve pt nutritional status and heal pressure injuries.   5. Blisters improving now, using barrier cream to blistered skin.Continue with topical treatment.  6. Sacrum care: States home health RN changed Dankins solution to sacrum, and wife is changing with gauze and pull ups. Changing several times Patterson day and seems to be improving. Skin care may be maximized with medicinal maggots; recommend Patterson wound consultation and examination of workability of this treatment given patient prognosis.   7. Goal of Care: Ambiguous code status, Wife has physical DNR but wants to use it on case by case basis. Discussed wishes for care, Still wants home health to come out for services, and wants DME and enteral feeding support. Wife wants patient at home and to be cared for in that environment butsStill wants option of going to hospital for care. Wants aggressive wound care via home health. Wants Palliative care, states  She does not want to have to make new relationships with hospice RN. Will continue to monitor for appropriate point of referral but for now the goals of care seem aligned with palliative approach.   Palliative care will continue to follow for goals of care clarification and symptom management. Return 1 week or  prn.  I spent 60 minutes providing this consultation,  from 11:30 to 12:30. More than 50% of the time in this consultation was spent coordinating communication.   HISTORY OF PRESENT ILLNESS:  Maurice Patterson is Patterson 63 y.o. year old male with multiple medical problems including CAD, dementia, severe protein calorie malnutrition,seizures, stroke, DM2, pancreatitis,CHF.  Palliative Care was asked to help address goals of care.   CODE STATUS: FULL (Wife has DNR she uses prn, but does not want this uploaded in [facility])  PPS: 20% HOSPICE ELIGIBILITY/DIAGNOSIS: TBD  PAST MEDICAL HISTORY:  Past Medical History:  Diagnosis Date  . Acute MI ([facility] [place])   . Arthritis   . Back pain   . CAD (coronary artery disease)   . CHF (congestive heart failure) ([facility])   . Chronic kidney disease   . Degenerative lumbar disc   . Dementia ([place])   . Diabetes mellitus without complication ([facility])   . GERD (gastroesophageal reflux disease)   . Hernia of abdominal cavity   . Hyperlipemia   . Hypertension   . Malignant intraductal papillary mucinous tumor of pancreas ([place])   . Memory loss   . Pancreatitis   . Seizures ([place])    staring spells  . Shingles   . Stroke ([facility]) 08/2017  . TIA (transient ischemic attack)     SOCIAL HX:  Social History   Tobacco Use  . Smoking status: Former Smoker    Last attempt to quit: 2013    Years since quitting: 7.3  . Smokeless tobacco: Never Used  . Tobacco comment: used to smoke 2PD for 40 yrs, quit about 4 years ago  Substance Use Topics  . Alcohol use: No    Alcohol/week: 0.0 standard drinks    Comment: occasional    ALLERGIES:  Allergies  Allergen Reactions  . Hydrocodone Anaphylaxis  . Morphine Other (See Comments)    Loss of memory  . Quetiapine Other (See Comments)    Insomnia, agitation, anxiety  . Ambien [Zolpidem] Other (See Comments)    delirium    . Brilinta [Ticagrelor] Other (See Comments)    Stroke   . Flexeril [Cyclobenzaprine] Other (See Comments)    delerium   . Flunitrazepam Other (See Comments)    ROHYPNOL (hallucinations)  . Haldol [Haloperidol Lactate] Other (See Comments)    delerium   . Levetiracetam Diarrhea and Other (See Comments)    Unable to walk  . Lorazepam Hives  . Risperdal [Risperidone] Other (See Comments)    Delirium   . Trazodone Other (See Comments)    Delirium Can take in low  doses   . Benadryl [Diphenhydramine Hcl (Sleep)] Rash  . Penicillins Rash    Mouth ulcers Has patient had Patterson PCN reaction causing immediate rash, facial/tongue/throat swelling, SOB or lightheadedness with hypotension: Yes Has patient had Patterson PCN reaction causing severe rash involving mucus membranes or skin necrosis: No Has patient had Patterson PCN reaction that required hospitalization No Has patient had Patterson PCN reaction occurring within the last 10 years: Yes If all of the above answers are "NO", then may proceed with Cephalosporin use.  . Vancomycin Rash    Rash around IV site during vancomycin infusion     PERTINENT MEDICATIONS:  Outpatient Encounter Medications as of 12/21/2018  Medication Sig  . acetaminophen (TYLENOL) 500 MG tablet Place 1 tablet (500 mg total) into feeding tube every 6 (six) hours as needed for mild pain.  Marland Kitchen aspirin 81 MG chewable tablet Place 1 tablet (81  mg total) into feeding tube daily.  Marland Kitchen atorvastatin (LIPITOR) 80 MG tablet Place 1 tablet (80 mg total) into feeding tube daily.  Vassie Loll Gel Base (HYDROGEL) GEL Apply 1 application topically daily.  . cefdinir (OMNICEF) 300 MG capsule Take 1 capsule (300 mg total) by mouth 2 (two) times daily for 10 days.  . chlorhexidine gluconate, MEDLINE KIT, (PERIDEX) 0.12 % solution 15 mLs by Mouth Rinse route 2 (two) times daily.  . cholecalciferol (D-VI-SOL) 10 MCG/ML LIQD Place 2.5 mLs (1,000 Units total) into feeding tube daily.  . cholecalciferol (D-VI-SOL) 10 MCG/ML LIQD Place 2.5 mLs (1,000 Units total) into feeding tube daily.  . clopidogrel (PLAVIX) 75 MG tablet Place 1 tablet (75 mg total) into feeding tube daily.  . collagenase (SANTYL) ointment Apply topically daily.  Marland Kitchen donepezil (ARICEPT) 10 MG tablet Place 1 tablet (10 mg total) into feeding tube 2 (two) times daily.  Marland Kitchen doxazosin (CARDURA) 2 MG tablet Take 1 tablet (2 mg total) by mouth Nightly.  . folic acid (FOLVITE) 1 MG tablet Take 1 tablet (1 mg total) by mouth  daily.  . furosemide (LASIX) 40 MG tablet Place 1 tablet (40 mg total) into feeding tube daily.  . insulin aspart (NOVOLOG) 100 UNIT/ML injection Inject 3 Units into the skin every 6 (six) hours.  . insulin glargine (LANTUS) 100 UNIT/ML injection Inject 0.15 mLs (15 Units total) into the skin daily.  . isosorbide mononitrate (IMDUR) 30 MG 24 hr tablet Take 1 tablet (30 mg total) by mouth 2 (two) times daily. Per pegtube  . lacosamide (VIMPAT) 50 MG TABS tablet Place 2 tablets (100 mg total) into feeding tube 2 (two) times daily.  Marland Kitchen lidocaine (LIDODERM) 5 % Place 1 patch onto the skin daily. Remove & Discard patch within 12 hours or as directed by MD  . loperamide (IMODIUM) 2 MG capsule Take 1 capsule (2 mg total) by mouth as needed for diarrhea or loose stools.  Marland Kitchen losartan (COZAAR) 100 MG tablet Take 1 tablet (100 mg total) by mouth daily.  . memantine (NAMENDA) 10 MG tablet Place 1 tablet (10 mg total) into feeding tube 2 (two) times daily.  . metoCLOPramide (REGLAN) 5 MG tablet Place 1 tablet (5 mg total) into feeding tube 4 (four) times daily -  before meals and at bedtime.  . metoprolol tartrate (LOPRESSOR) 25 MG tablet Place 1 tablet (25 mg total) into feeding tube 2 (two) times daily.  Marland Kitchen mouth rinse LIQD solution 15 mLs by Mouth Rinse route 2 (two) times daily as needed.  . Multiple Vitamin (MULTIVITAMIN) LIQD Take 30 mLs by mouth daily.  . Nutritional Supplements (FEEDING SUPPLEMENT, GLUCERNA 1.5 CAL,) LIQD Place 1,000 mLs into feeding tube continuous.  Marland Kitchen nystatin (MYCOSTATIN) 100000 UNIT/ML suspension Take 5 mLs (500,000 Units total) by mouth 4 (four) times daily.  . Pancrelipase, Lip-Prot-Amyl, (CREON) 3000-9500 units CPEP Take 24,000 Units by mouth 3 (three) times daily before meals.  . pantoprazole sodium (PROTONIX) 40 mg/20 mL PACK Place 20 mLs (40 mg total) into feeding tube daily.  . potassium chloride (KLOR-CON) 20 MEQ packet Place 20 mEq into feeding tube daily.  Marland Kitchen senna-docusate  (SENOKOT-S) 8.6-50 MG tablet Place 2 tablets into feeding tube Nightly.  . sodium chloride 0.9 % infusion Inject 500 mLs into the vein daily. Please infuse over 2 hours  . vitamin C (VITAMIN C) 250 MG tablet Place 1 tablet (250 mg total) into feeding tube 2 (two) times daily.  Marland Kitchen Water For Irrigation, Sterile (  FREE WATER) SOLN Place 75 mLs into feeding tube every 4 (four) hours.   No facility-administered encounter medications on file as of 12/21/2018.     PHYSICAL EXAM:  Continuous feedings, 50 ml/ hr and has am glucose 130 mg/dl.  lowgrade fever per wife report  General: NAD, frail appearing, thin, more alert today and weakly smiles x 1 Pulmonary: coughing, productively . No SOB at rest Abdomen: tube feedings, 3 bms Patterson day, formed and soft, improving with creon GU: foley catheter, recently changed foley 12/17/2018 Extremities: UE edema, contracture of LE, bedbound. Skin: Blisters are better per wife report, using dakins on sacrum to loosen eschar Neurological: Weakness, bed bound,  PPS 20%, many neuro deficits.  Cyndia Skeeters DNP, AGPCNP-BC

## 2018-12-22 LAB — CULTURE, BLOOD (ROUTINE X 2): Culture: NO GROWTH

## 2018-12-26 DIAGNOSIS — Z451 Encounter for adjustment and management of infusion pump: Secondary | ICD-10-CM | POA: Diagnosis not present

## 2018-12-26 DIAGNOSIS — Z8673 Personal history of transient ischemic attack (TIA), and cerebral infarction without residual deficits: Secondary | ICD-10-CM | POA: Diagnosis not present

## 2018-12-26 DIAGNOSIS — Z434 Encounter for attention to other artificial openings of digestive tract: Secondary | ICD-10-CM | POA: Diagnosis not present

## 2018-12-26 DIAGNOSIS — L89150 Pressure ulcer of sacral region, unstageable: Secondary | ICD-10-CM | POA: Diagnosis not present

## 2018-12-28 ENCOUNTER — Other Ambulatory Visit: Payer: PPO | Admitting: Primary Care

## 2018-12-28 ENCOUNTER — Telehealth: Payer: Self-pay | Admitting: Primary Care

## 2018-12-28 ENCOUNTER — Other Ambulatory Visit: Payer: Self-pay

## 2018-12-28 DIAGNOSIS — Z515 Encounter for palliative care: Secondary | ICD-10-CM

## 2018-12-28 NOTE — Telephone Encounter (Signed)
T/c to Abby, [facility] RN, to coordinate care.

## 2018-12-28 NOTE — Progress Notes (Signed)
[facility] Palliative Care Consult Note Telephone: [phone]  Fax: [phone]   TELEHEALTH VISIT STATEMENT Due to the COVID-19 crisis, this visit was done via telemedicine from my office. It was initiated and consented to by this patient and/or family.  PATIENT NAME: Maurice Patterson DOB: 04/19/1956 MRN: [id number]  PRIMARY CARE PROVIDER:   [name], MD  REFERRING PROVIDER:  [name], MD [address] [place], [place] [address]  RESPONSIBLE PARTY:   Extended Emergency Contact Information Primary Emergency Contact: [facility] Address: [address]          [place], [place] [address] Johnnette Litter of [place] Phone: [phone] Work Phone: [phone] Relation: Spouse Secondary Emergency Contact: Pollock,Kimberly Address: [address]          [place], [place] [address] Johnnette Litter of [place] Phone: [phone] Mobile Phone: [phone] Relation: Daughter  Palliative Care was asked to follow patient by consultation request of Dr. [name], MD  . This is a follow up visit.  Visit was held by telemedicine with daughter Maudie Mercury, patient and myself. Wife Cecille Rubin has returned to work.  ASSESSMENT and RECOMMENDATIONS:    1. Anxiety: Discussed ruling out pain first as cause for agitation/anxiety due to inability to report. Had an attack one night and could not sleep. BP is labile per daughter reports. States his bp goes up and pulse goes down. Will continue to monitor, appears comfortable on video exam.  Also discussed revisiting some of the preparations for anxiety listed on his allergy/does not tolerate list. These drugs state delirium as a side effect, but they would have been given for agitation/delirium. Maudie Mercury states we can discuss this further if the need arises.  2. Pain control: Reports 2 ml morphine (10 mg/5 ml), recommend titrating to comfort and using q 4-6 hours to achieve ongoing pain control. Education  provided re titration by 1 mg a dose  to comfort. Will continue to educate re pain management in serious illness.Currently taking 4 mg morphine episodically prior to dressing changes, bathing. Encouraged to give scheduled doses to improve overall pain and increase mobility and interaction; Kim voiced understanding  3. Nutrition: Tube feedings on glucerna now, review most appropriate tube feeding .Family states glucose WNL. Nutritionist from [facility] has consulted, will f/u with determination. Family has weight gain as goal, clarified this would be a long term goal and that wound healing would require more protein and calories, and that maintaining might be a better short term goal.   4. Hiccups: Controlled with thorazine. Maudie Mercury states has had success with thorazine with resolution of hiccups.  5. Wound management: Continue to assess for optimal wound management protocol. Maudie Mercury states that blistered areas are becoming black, and that one caregiver mentioned seeing bone in the sacral area. Home health nurse is Abby from [facility].  T/c made to discuss wound care, message left.  6. Goals of care:  Continue with team of MD, Palliative NP and [facility] care team. Maudie Mercury states they don's wish to return to hospital but want home treatment for the above issues.   Palliative medicine team will continue to support patient, patient's family, and medical team. Visit consisted of counseling and education dealing with the complex and emotionally intense issues of symptom management and palliative care in the setting of serious and potentially life-threatening illness. Palliative care will continue to follow for goals of care clarification and symptom management. Return 1 week or prn.  T/c to [employer]  FNP of [facility] to discuss case RE nutrition and wound healing. She will f/u with visit soon and return call.   I spent 40 minutes providing this consultation,  from 1150 to 1230. More than 50% of the time in this  consultation was spent coordinating communication.   HISTORY OF PRESENT ILLNESS:  Maurice Patterson is a 63 y.o. year old male with multiple medical problems including unstageable wounds, CAD, dementia, severe protein calorie malnutrition,seizures, stroke, DM2, pancreatitis,CHF. Palliative Care was asked to help address goals of care.   CODE STATUS: FULL (Wife has DNR she uses prn, but does not want this uploaded in [facility])  PPS: 20% HOSPICE ELIGIBILITY/DIAGNOSIS: yes/protein calorie malnutrition.  PAST MEDICAL HISTORY:  Past Medical History:  Diagnosis Date  . Acute MI ([facility])   . Arthritis   . Back pain   . CAD (coronary artery disease)   . CHF (congestive heart failure) ([facility])   . Chronic kidney disease   . Degenerative lumbar disc   . Dementia ([facility])   . Diabetes mellitus without complication ([facility])   . GERD (gastroesophageal reflux disease)   . Hernia of abdominal cavity   . Hyperlipemia   . Hypertension   . Malignant intraductal papillary mucinous tumor of pancreas ([facility])   . Memory loss   . Pancreatitis   . Seizures ([facility])    staring spells  . Shingles   . Stroke ([facility] [place]) 08/2017  . TIA (transient ischemic attack)     SOCIAL HX:  Social History   Tobacco Use  . Smoking status: Former Smoker    Last attempt to quit: 2013    Years since quitting: 7.3  . Smokeless tobacco: Never Used  . Tobacco comment: used to smoke 2PD for 40 yrs, quit about 4 years ago  Substance Use Topics  . Alcohol use: No    Alcohol/week: 0.0 standard drinks    Comment: occasional    ALLERGIES:  Allergies  Allergen Reactions  . Hydrocodone Anaphylaxis  . Morphine Other (See Comments)    Loss of memory  . Quetiapine Other (See Comments)    Insomnia, agitation, anxiety  . Ambien [Zolpidem] Other (See Comments)    delirium    . Brilinta [Ticagrelor] Other (See Comments)    Stroke   . Flexeril [Cyclobenzaprine] Other (See Comments)    delerium   . Flunitrazepam Other (See Comments)     ROHYPNOL (hallucinations)  . Haldol [Haloperidol Lactate] Other (See Comments)    delerium   . Levetiracetam Diarrhea and Other (See Comments)    Unable to walk  . Lorazepam Hives  . Risperdal [Risperidone] Other (See Comments)    Delirium   . Trazodone Other (See Comments)    Delirium Can take in low doses   . Benadryl [Diphenhydramine Hcl (Sleep)] Rash  . Penicillins Rash    Mouth ulcers Has patient had a PCN reaction causing immediate rash, facial/tongue/throat swelling, SOB or lightheadedness with hypotension: Yes Has patient had a PCN reaction causing severe rash involving mucus membranes or skin necrosis: No Has patient had a PCN reaction that required hospitalization No Has patient had a PCN reaction occurring within the last 10 years: Yes If all of the above answers are "NO", then may proceed with Cephalosporin use.  . Vancomycin Rash    Rash around IV site during vancomycin infusion     PERTINENT MEDICATIONS:  Outpatient Encounter Medications as of 12/28/2018  Medication Sig  . acetaminophen (TYLENOL) 500 MG tablet Place 1 tablet (500 mg  total) into feeding tube every 6 (six) hours as needed for mild pain.  Marland Kitchen aspirin 81 MG chewable tablet Place 1 tablet (81 mg total) into feeding tube daily.  Marland Kitchen atorvastatin (LIPITOR) 80 MG tablet Place 1 tablet (80 mg total) into feeding tube daily.  Vassie Loll Gel Base (HYDROGEL) GEL Apply 1 application topically daily.  . chlorhexidine gluconate, MEDLINE KIT, (PERIDEX) 0.12 % solution 15 mLs by Mouth Rinse route 2 (two) times daily.  . cholecalciferol (D-VI-SOL) 10 MCG/ML LIQD Place 2.5 mLs (1,000 Units total) into feeding tube daily.  . cholecalciferol (D-VI-SOL) 10 MCG/ML LIQD Place 2.5 mLs (1,000 Units total) into feeding tube daily.  . clopidogrel (PLAVIX) 75 MG tablet Place 1 tablet (75 mg total) into feeding tube daily.  . collagenase (SANTYL) ointment Apply topically daily.  Marland Kitchen donepezil (ARICEPT) 10 MG tablet Place 1 tablet (10 mg  total) into feeding tube 2 (two) times daily.  Marland Kitchen doxazosin (CARDURA) 2 MG tablet Take 1 tablet (2 mg total) by mouth Nightly.  . folic acid (FOLVITE) 1 MG tablet Take 1 tablet (1 mg total) by mouth daily.  . furosemide (LASIX) 40 MG tablet Place 1 tablet (40 mg total) into feeding tube daily.  . insulin aspart (NOVOLOG) 100 UNIT/ML injection Inject 3 Units into the skin every 6 (six) hours.  . insulin glargine (LANTUS) 100 UNIT/ML injection Inject 0.15 mLs (15 Units total) into the skin daily.  . isosorbide mononitrate (IMDUR) 30 MG 24 hr tablet Take 1 tablet (30 mg total) by mouth 2 (two) times daily. Per pegtube  . lacosamide (VIMPAT) 50 MG TABS tablet Place 2 tablets (100 mg total) into feeding tube 2 (two) times daily.  Marland Kitchen lidocaine (LIDODERM) 5 % Place 1 patch onto the skin daily. Remove & Discard patch within 12 hours or as directed by MD  . loperamide (IMODIUM) 2 MG capsule Take 1 capsule (2 mg total) by mouth as needed for diarrhea or loose stools.  Marland Kitchen losartan (COZAAR) 100 MG tablet Take 1 tablet (100 mg total) by mouth daily.  . memantine (NAMENDA) 10 MG tablet Place 1 tablet (10 mg total) into feeding tube 2 (two) times daily.  . metoCLOPramide (REGLAN) 5 MG tablet Place 1 tablet (5 mg total) into feeding tube 4 (four) times daily -  before meals and at bedtime.  . metoprolol tartrate (LOPRESSOR) 25 MG tablet Place 1 tablet (25 mg total) into feeding tube 2 (two) times daily.  Marland Kitchen mouth rinse LIQD solution 15 mLs by Mouth Rinse route 2 (two) times daily as needed.  . Multiple Vitamin (MULTIVITAMIN) LIQD Take 30 mLs by mouth daily.  . Nutritional Supplements (FEEDING SUPPLEMENT, GLUCERNA 1.5 CAL,) LIQD Place 1,000 mLs into feeding tube continuous.  Marland Kitchen nystatin (MYCOSTATIN) 100000 UNIT/ML suspension Take 5 mLs (500,000 Units total) by mouth 4 (four) times daily.  . Pancrelipase, Lip-Prot-Amyl, (CREON) 3000-9500 units CPEP Take 24,000 Units by mouth 3 (three) times daily before meals.  .  pantoprazole sodium (PROTONIX) 40 mg/20 mL PACK Place 20 mLs (40 mg total) into feeding tube daily.  . potassium chloride (KLOR-CON) 20 MEQ packet Place 20 mEq into feeding tube daily.  Marland Kitchen senna-docusate (SENOKOT-S) 8.6-50 MG tablet Place 2 tablets into feeding tube Nightly.  . sodium chloride 0.9 % infusion Inject 500 mLs into the vein daily. Please infuse over 2 hours  . vitamin C (VITAMIN C) 250 MG tablet Place 1 tablet (250 mg total) into feeding tube 2 (two) times daily.  Marland Kitchen Water For  Irrigation, Sterile (FREE WATER) SOLN Place 75 mLs into feeding tube every 4 (four) hours.   No facility-administered encounter medications on file as of 12/28/2018.     PHYSICAL EXAM/ROS:  General: NAD, frail appearing, cachectic Cardiovascular: bp fluctuations per family report Pulmonary: no SOB, no cough, RA Abdomen: tube feedings, bm incontinent GU: foley catheter, was recently replaced. Extremities: no edema, joint contractures Skin: no rashes, wound on feet and sacrum, unstageable Neurological: Weakness , advanced dementia, FAST Score 7 F, bedbound, alert on occasion per family report. Non verbal at this visit.  Cyndia Skeeters DNP, AGPCNP-BC

## 2018-12-29 ENCOUNTER — Other Ambulatory Visit: Payer: Self-pay

## 2018-12-29 ENCOUNTER — Other Ambulatory Visit: Payer: PPO | Admitting: Primary Care

## 2018-12-29 DIAGNOSIS — R52 Pain, unspecified: Secondary | ICD-10-CM | POA: Diagnosis not present

## 2018-12-29 DIAGNOSIS — Z515 Encounter for palliative care: Secondary | ICD-10-CM | POA: Diagnosis not present

## 2018-12-29 DIAGNOSIS — R627 Adult failure to thrive: Secondary | ICD-10-CM | POA: Diagnosis not present

## 2018-12-29 NOTE — Progress Notes (Signed)
[facility] Palliative Care Consult Note Telephone: [phone]  Fax: [phone]    TELEHEALTH VISIT STATEMENT Due to the COVID-19 crisis, this visit was done via telemedicine from my office. It was initiated and consented to by this patient and/or family.   PATIENT NAME: Maurice Patterson DOB: 01/06/1956 MRN: [id number]  PRIMARY CARE PROVIDER:  [name], MD  REFERRING PROVIDER: [name], [place] [address]  RESPONSIBLE PARTY:   Extended Emergency Contact Information Primary Emergency Contact: [facility] Address: [address]          [place], [place] [address] Johnnette Litter of [place] Phone: [phone] Work Phone: [phone] Relation: Spouse Secondary Emergency Contact: Pollock,Kimberly Address: [address]          [place], [place] [address] Johnnette Litter of [place] Phone: [phone] Mobile Phone: [phone] Relation: Daughter  Palliative Care was asked to follow patient by consultation request of Dr. [name], MD. This is a follow up visit by telemedicine with family and care team.  ASSESSMENT and RECOMMENDATIONS:   1. Skin integrity: Recommend supportive care and pain control: this is a Merrilyn Puma ulcer which is seen at end of life.  I was able to see the ulcer today, this is a Kennedy ulcer, commonly seen in final weeks of life. The Kennedy ulcer does not respond to treatment for healing due to general debility advancing towards end of life, and treatment for pain control is the usual goal. Further skin breakdown is extending in spite of supportive care including nutrition.  2. Pain control: Managing chronic pain with scheduled medication management is best practice. PRN use if family is more comfortable with that can also be done for assessed episodes of increased pain. Morphine administration will also help with dyspnea.  3. Dyspnea: Recommend treating dyspnea with morphine,  oxygen, positioning head of bed at 45 degrees (semifowlers position). Has oxygen and family is checking pulse oximetry. Lability is likely due to hemodynamic decompensation; convention is to treat sx of dyspnea e.g. morphine dosing for air hunger, oxygen as needed for SOB.  4. Nutrition: Tube feedings per family request. However due to evidence of advancing disease by continued skin break down, patient is likely not benefiting from tube feedings.   5. Goals of Care: FULL (DNR in home which wife uses prn, but does not want this uploaded in [place]). Discussion with wife that he is near death, she stated this initially and that he is ready to go. She thinks he has been waiting for a visit from the MD who has made a home visit today. She continues to be adamant she does not want hospice; we discussed a plan for getting care over the weekend if needed. [facility] and [facility], both involved, also provide 24 hour phone support. Visit consisted of counseling and education dealing with the complex and emotionally intense issues of symptom management and palliative care in the setting of serious and potentially life-threatening illness.  Palliative care will continue to follow for goals of care clarification and symptom management as needed by care team. Will f/u prn.  I spent 50 minutes providing this consultation,  from 1125 to 1215 pm. More than 50% of the time in this consultation was spent coordinating communication.   HISTORY OF PRESENT ILLNESS:  Maurice Patterson is a 63 y.o. year old male with multiple medical problems including unstageable wounds (Kennedy ulcer) CAD, dementia, severe protein calorie malnutrition,seizures, stroke, DM2, pancreatitis,CHF.  Palliative Care was asked to help address goals of care.   CODE STATUS: FULL; wife has a DNR in the home which she will ask to be used case by case.   PPS: 10% HOSPICE ELIGIBILITY/DIAGNOSIS: TBD  PAST MEDICAL HISTORY:  Past Medical History:   Diagnosis Date  . Acute MI ([place])   . Arthritis   . Back pain   . CAD (coronary artery disease)   . CHF (congestive heart failure) ([place])   . Chronic kidney disease   . Degenerative lumbar disc   . Dementia ([place])   . Diabetes mellitus without complication ([place])   . GERD (gastroesophageal reflux disease)   . Hernia of abdominal cavity   . Hyperlipemia   . Hypertension   . Malignant intraductal papillary mucinous tumor of pancreas ([place])   . Memory loss   . Pancreatitis   . Seizures ([place])    staring spells  . Shingles   . Stroke ([place]) 08/2017  . TIA (transient ischemic attack)     SOCIAL HX:  Social History   Tobacco Use  . Smoking status: Former Smoker    Last attempt to quit: 2013    Years since quitting: 7.3  . Smokeless tobacco: Never Used  . Tobacco comment: used to smoke 2PD for 40 yrs, quit about 4 years ago  Substance Use Topics  . Alcohol use: No    Alcohol/week: 0.0 standard drinks    Comment: occasional    ALLERGIES:  Allergies  Allergen Reactions  . Hydrocodone Anaphylaxis  . Morphine Other (See Comments)    Loss of memory  . Quetiapine Other (See Comments)    Insomnia, agitation, anxiety  . Ambien [Zolpidem] Other (See Comments)    delirium    . Brilinta [Ticagrelor] Other (See Comments)    Stroke   . Flexeril [Cyclobenzaprine] Other (See Comments)    delerium   . Flunitrazepam Other (See Comments)    ROHYPNOL (hallucinations)  . Haldol [Haloperidol Lactate] Other (See Comments)    delerium   . Levetiracetam Diarrhea and Other (See Comments)    Unable to walk  . Lorazepam Hives  . Risperdal [Risperidone] Other (See Comments)    Delirium   . Trazodone Other (See Comments)    Delirium Can take in low doses   . Benadryl [Diphenhydramine Hcl (Sleep)] Rash  . Penicillins Rash    Mouth ulcers Has patient had a PCN reaction causing immediate rash, facial/tongue/throat swelling, SOB or lightheadedness with hypotension: Yes Has patient had a  PCN reaction causing severe rash involving mucus membranes or skin necrosis: No Has patient had a PCN reaction that required hospitalization No Has patient had a PCN reaction occurring within the last 10 years: Yes If all of the above answers are "NO", then may proceed with Cephalosporin use.  . Vancomycin Rash    Rash around IV site during vancomycin infusion     PERTINENT MEDICATIONS:  Outpatient Encounter Medications as of 12/29/2018  Medication Sig  . acetaminophen (TYLENOL) 500 MG tablet Place 1 tablet (500 mg total) into feeding tube every 6 (six) hours as needed for mild pain.  Marland Kitchen aspirin 81 MG chewable tablet Place 1 tablet (81 mg total) into feeding tube daily.  Marland Kitchen atorvastatin (LIPITOR) 80 MG tablet Place 1 tablet (80 mg total) into feeding tube daily.  Vassie Loll Gel Base (HYDROGEL) GEL Apply 1 application topically daily.  . chlorhexidine gluconate, MEDLINE KIT, (PERIDEX) 0.12 % solution 15 mLs by Mouth Rinse route 2 (two) times  daily.  . cholecalciferol (D-VI-SOL) 10 MCG/ML LIQD Place 2.5 mLs (1,000 Units total) into feeding tube daily.  . cholecalciferol (D-VI-SOL) 10 MCG/ML LIQD Place 2.5 mLs (1,000 Units total) into feeding tube daily.  . clopidogrel (PLAVIX) 75 MG tablet Place 1 tablet (75 mg total) into feeding tube daily.  . collagenase (SANTYL) ointment Apply topically daily.  Marland Kitchen donepezil (ARICEPT) 10 MG tablet Place 1 tablet (10 mg total) into feeding tube 2 (two) times daily.  Marland Kitchen doxazosin (CARDURA) 2 MG tablet Take 1 tablet (2 mg total) by mouth Nightly.  . folic acid (FOLVITE) 1 MG tablet Take 1 tablet (1 mg total) by mouth daily.  . furosemide (LASIX) 40 MG tablet Place 1 tablet (40 mg total) into feeding tube daily.  . insulin aspart (NOVOLOG) 100 UNIT/ML injection Inject 3 Units into the skin every 6 (six) hours.  . insulin glargine (LANTUS) 100 UNIT/ML injection Inject 0.15 mLs (15 Units total) into the skin daily.  . isosorbide mononitrate (IMDUR) 30 MG 24 hr tablet  Take 1 tablet (30 mg total) by mouth 2 (two) times daily. Per pegtube  . lacosamide (VIMPAT) 50 MG TABS tablet Place 2 tablets (100 mg total) into feeding tube 2 (two) times daily.  Marland Kitchen lidocaine (LIDODERM) 5 % Place 1 patch onto the skin daily. Remove & Discard patch within 12 hours or as directed by MD  . loperamide (IMODIUM) 2 MG capsule Take 1 capsule (2 mg total) by mouth as needed for diarrhea or loose stools.  Marland Kitchen losartan (COZAAR) 100 MG tablet Take 1 tablet (100 mg total) by mouth daily.  . memantine (NAMENDA) 10 MG tablet Place 1 tablet (10 mg total) into feeding tube 2 (two) times daily.  . metoCLOPramide (REGLAN) 5 MG tablet Place 1 tablet (5 mg total) into feeding tube 4 (four) times daily -  before meals and at bedtime.  . metoprolol tartrate (LOPRESSOR) 25 MG tablet Place 1 tablet (25 mg total) into feeding tube 2 (two) times daily.  Marland Kitchen mouth rinse LIQD solution 15 mLs by Mouth Rinse route 2 (two) times daily as needed.  . Multiple Vitamin (MULTIVITAMIN) LIQD Take 30 mLs by mouth daily.  . Nutritional Supplements (FEEDING SUPPLEMENT, GLUCERNA 1.5 CAL,) LIQD Place 1,000 mLs into feeding tube continuous.  Marland Kitchen nystatin (MYCOSTATIN) 100000 UNIT/ML suspension Take 5 mLs (500,000 Units total) by mouth 4 (four) times daily.  . Pancrelipase, Lip-Prot-Amyl, (CREON) 3000-9500 units CPEP Take 24,000 Units by mouth 3 (three) times daily before meals.  . pantoprazole sodium (PROTONIX) 40 mg/20 mL PACK Place 20 mLs (40 mg total) into feeding tube daily.  . potassium chloride (KLOR-CON) 20 MEQ packet Place 20 mEq into feeding tube daily.  Marland Kitchen senna-docusate (SENOKOT-S) 8.6-50 MG tablet Place 2 tablets into feeding tube Nightly.  . sodium chloride 0.9 % infusion Inject 500 mLs into the vein daily. Please infuse over 2 hours  . vitamin C (VITAMIN C) 250 MG tablet Place 1 tablet (250 mg total) into feeding tube 2 (two) times daily.  . Water For Irrigation, Sterile (FREE WATER) SOLN Place 75 mLs into feeding  tube every 4 (four) hours.   No facility-administered encounter medications on file as of 12/29/2018.     PHYSICAL EXAM/ ROS per family:  General: NAD, frail appearing, cachectic Cardiovascular: labile bp and pulse Pulmonary: no cough, PO2 labile Abdomen:  tube feedings GU: foley catheter  Extremities: no edema, contractures of UE and LE Skin: 8x8 cm kennedy ulcer on sacrum, home health is Rx  Neurological: extreme debility, lethargy, bedbound, 100% dependent in ADLs  [place], AGPCNP-BC

## 2019-01-01 DIAGNOSIS — E119 Type 2 diabetes mellitus without complications: Secondary | ICD-10-CM | POA: Diagnosis not present

## 2019-01-01 DIAGNOSIS — R627 Adult failure to thrive: Secondary | ICD-10-CM | POA: Diagnosis not present

## 2019-01-01 DIAGNOSIS — K859 Acute pancreatitis without necrosis or infection, unspecified: Secondary | ICD-10-CM | POA: Diagnosis not present

## 2019-01-01 DIAGNOSIS — I219 Acute myocardial infarction, unspecified: Secondary | ICD-10-CM | POA: Diagnosis not present

## 2019-01-01 DIAGNOSIS — J690 Pneumonitis due to inhalation of food and vomit: Secondary | ICD-10-CM | POA: Diagnosis not present

## 2019-01-22 DEATH — deceased

## 2019-04-20 IMAGING — CR DG CHEST 2V
2 series · 2 of 2 positions shown · non-contrast
Comparison: Chest and abdominal radiographs 08/25/2018

CLINICAL DATA: Aspiration pneumonia.

EXAM:
CHEST - 2 VIEW

[chest lat]
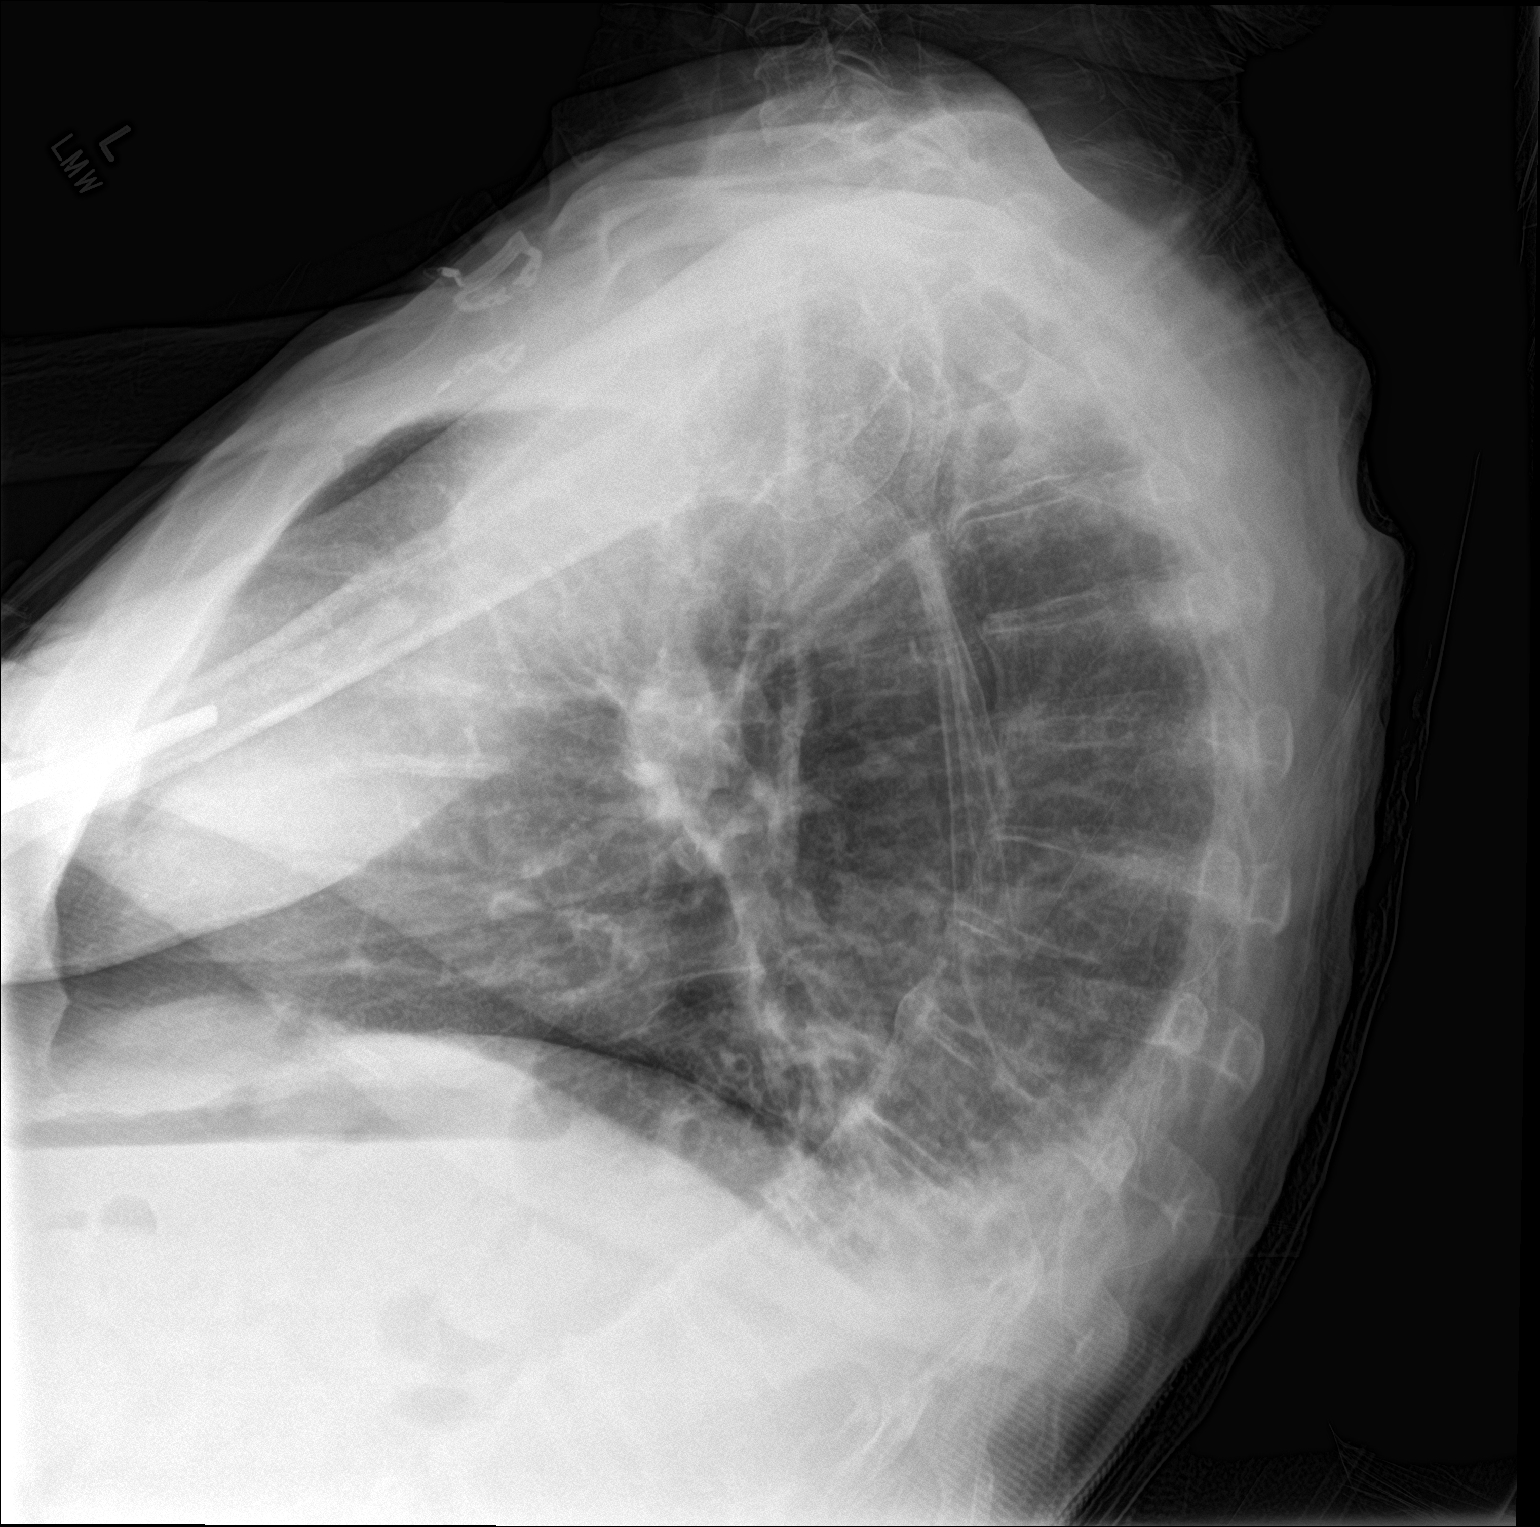

[chest ap]
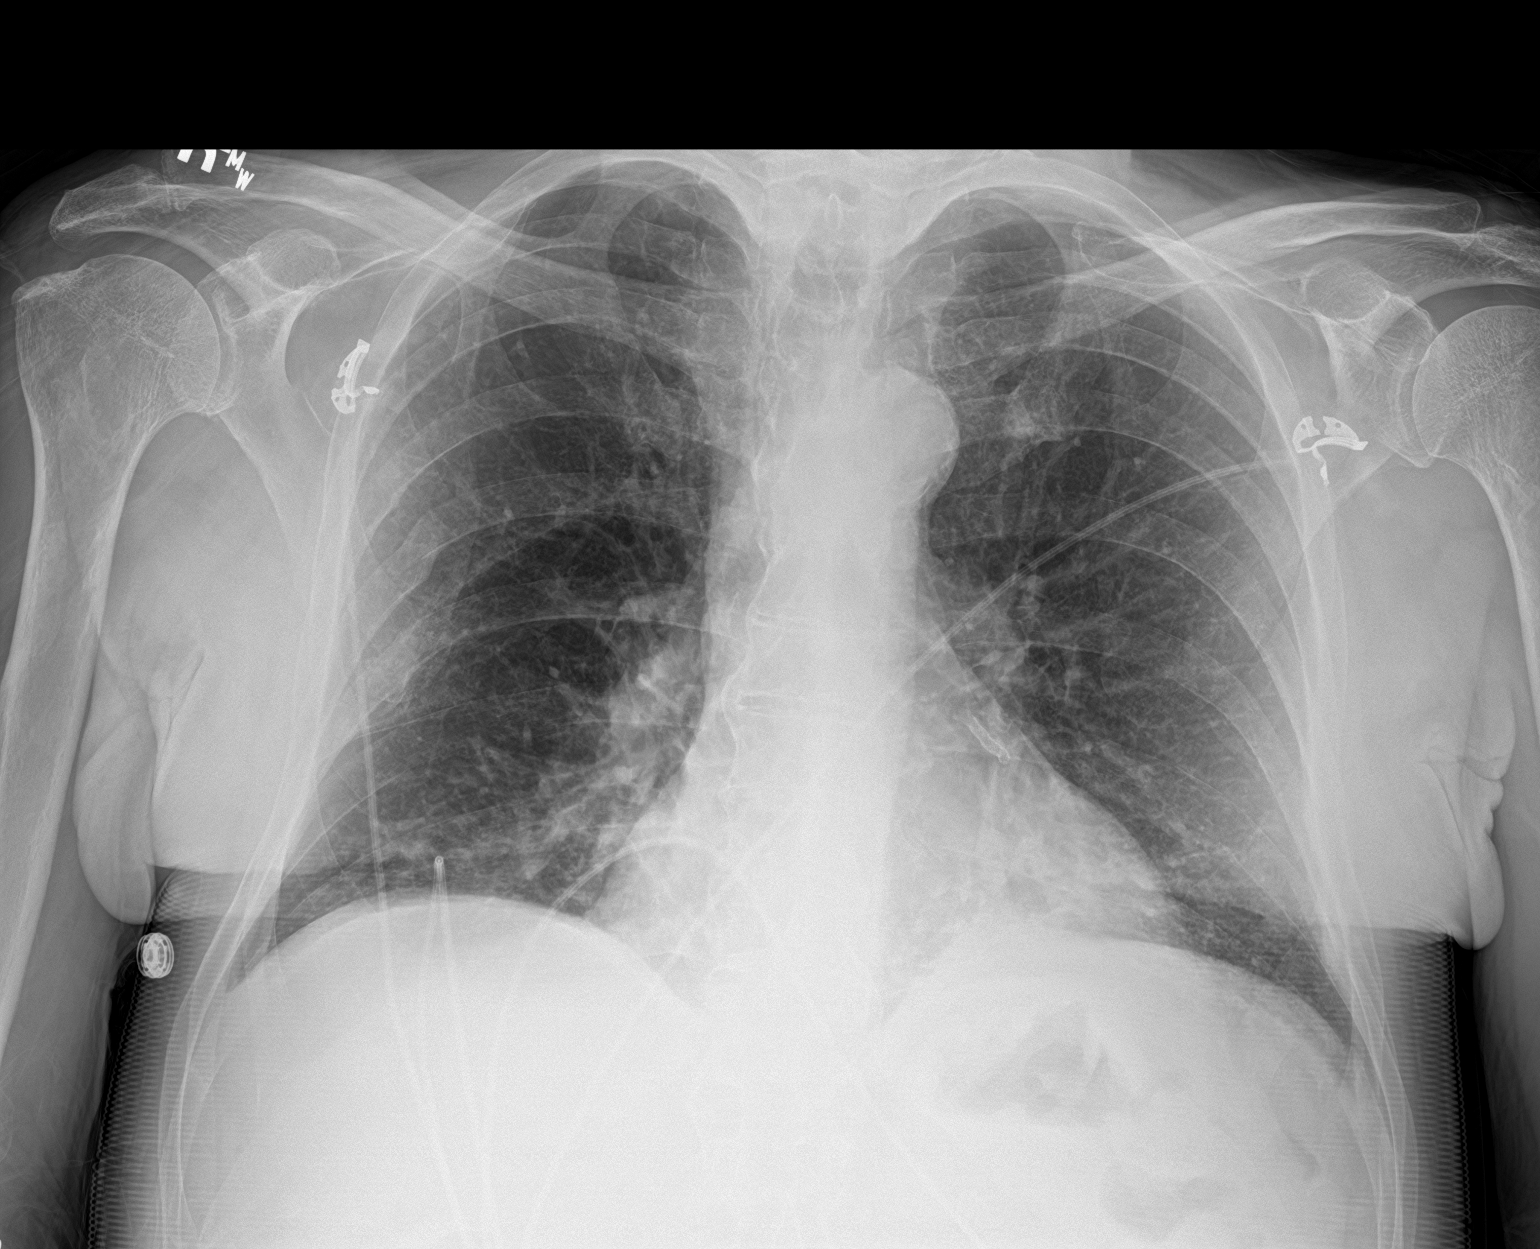

[2 of 2 positions shown; findings below may reference images not displayed]

FINDINGS: Unchanged heart size and mediastinal contours. Coronary stent.
Slightly improved aeration from prior exam. No significant change in
streaky bibasilar opacities. Bronchial thickening is also unchanged.
No new airspace disease. There are small bilateral pleural
effusions. No pneumothorax. Remote right rib fractures.
IMPRESSION: 1. Slightly improved aeration from prior exam. Unchanged streaky
bibasilar opacities that may be atelectasis or seen in the setting
of aspiration.
2. Small pleural effusions.

## 2019-04-21 IMAGING — DX DG CHEST 1V PORT
1 series · 1 of 1 positions shown · non-contrast
Comparison: Yesterday

CLINICAL DATA: Hypoxia

EXAM:
PORTABLE CHEST 1 VIEW

[chest ap]
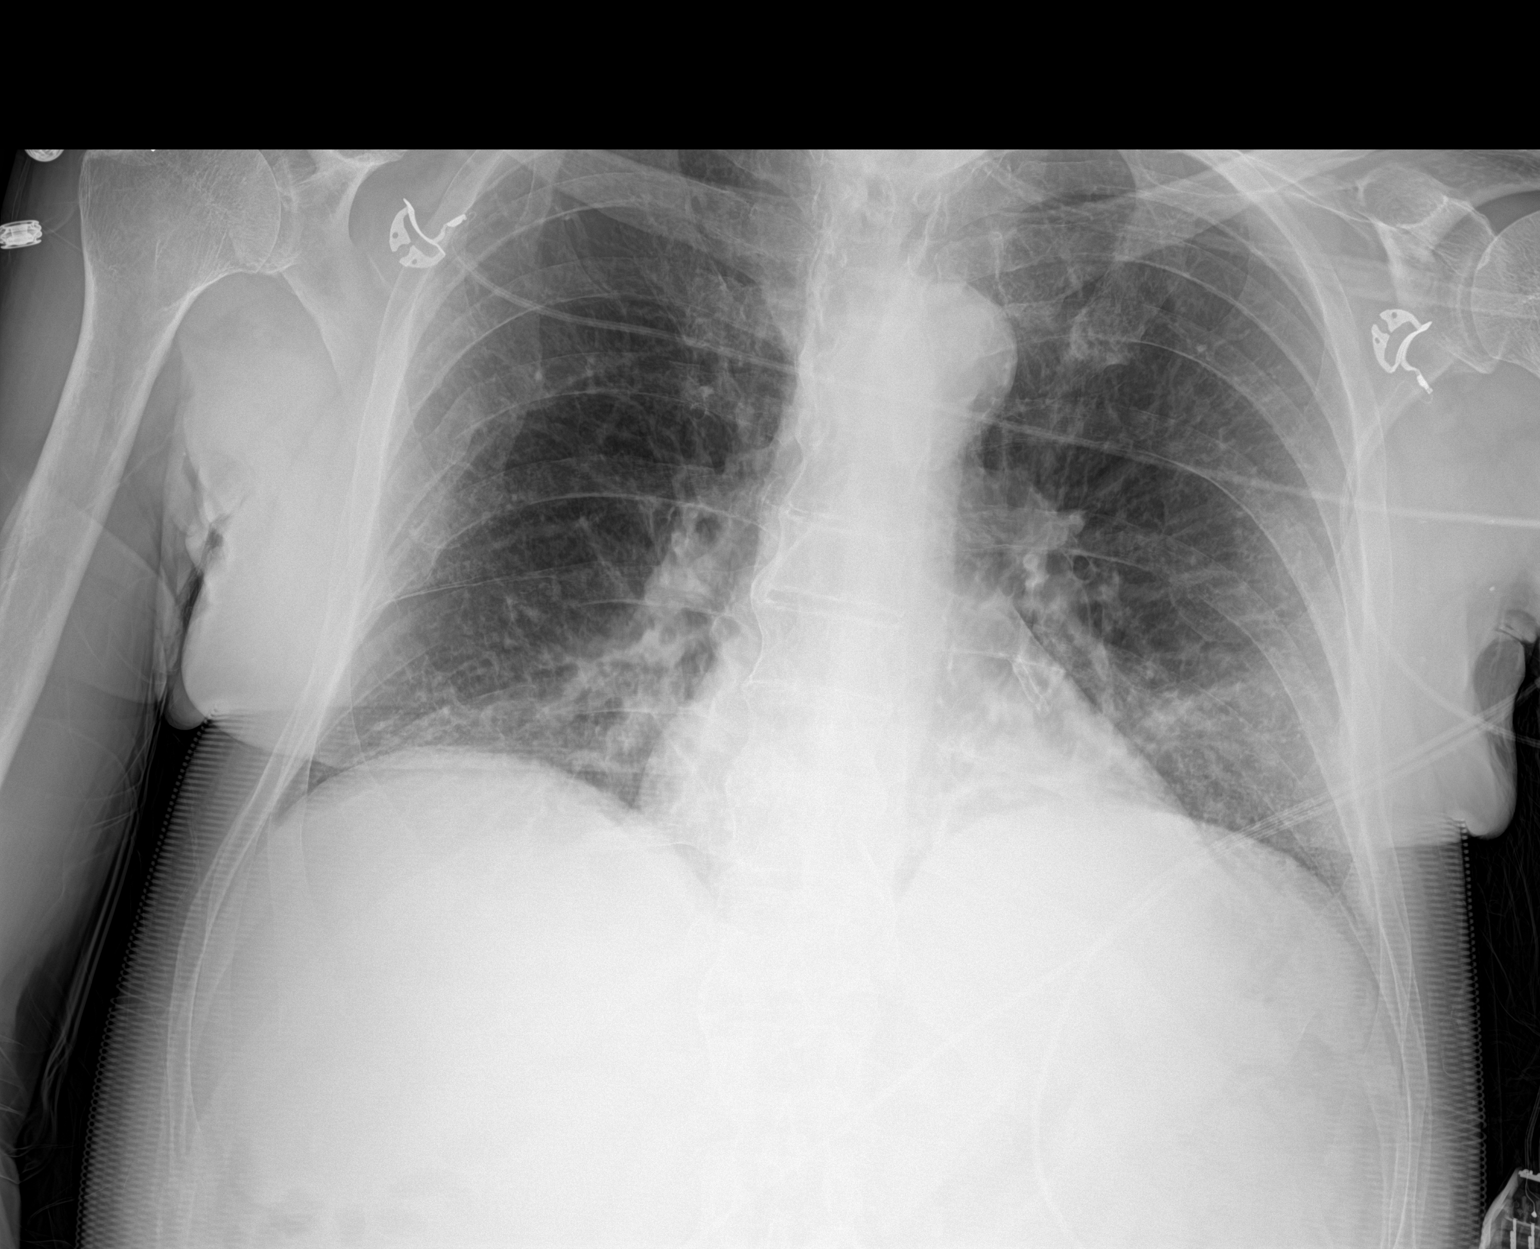

[1 of 1 positions shown; findings below may reference images not displayed]

FINDINGS: Normal heart size and mediastinal contours. Coronary stent. Stable
interstitial coarsening and bandlike opacity at the bases. There is
no edema, effusion, or pneumothorax. Remote right rib fractures.
IMPRESSION: Unchanged atelectasis or bronchopneumonia.
